# Patient Record
Sex: Male | Born: 1950 | Race: Black or African American | Hispanic: No | Marital: Married | Smoking: Former smoker
Health system: Southern US, Community
[De-identification: ages and names within clinical notes are randomized; demographics above are authoritative.]

## PROBLEM LIST (undated history)

## (undated) DIAGNOSIS — A0472 Enterocolitis due to Clostridium difficile, not specified as recurrent: Secondary | ICD-10-CM

## (undated) DIAGNOSIS — A4181 Sepsis due to Enterococcus: Secondary | ICD-10-CM

## (undated) DIAGNOSIS — I2510 Atherosclerotic heart disease of native coronary artery without angina pectoris: Secondary | ICD-10-CM

## (undated) DIAGNOSIS — I951 Orthostatic hypotension: Secondary | ICD-10-CM

## (undated) DIAGNOSIS — I5022 Chronic systolic (congestive) heart failure: Secondary | ICD-10-CM

## (undated) DIAGNOSIS — Z953 Presence of xenogenic heart valve: Secondary | ICD-10-CM

## (undated) DIAGNOSIS — N186 End stage renal disease: Secondary | ICD-10-CM

## (undated) DIAGNOSIS — K648 Other hemorrhoids: Secondary | ICD-10-CM

## (undated) DIAGNOSIS — E119 Type 2 diabetes mellitus without complications: Secondary | ICD-10-CM

## (undated) DIAGNOSIS — Z992 Dependence on renal dialysis: Secondary | ICD-10-CM

## (undated) DIAGNOSIS — Z8774 Personal history of (corrected) congenital malformations of heart and circulatory system: Secondary | ICD-10-CM

## (undated) DIAGNOSIS — I10 Essential (primary) hypertension: Secondary | ICD-10-CM

## (undated) DIAGNOSIS — K922 Gastrointestinal hemorrhage, unspecified: Principal | ICD-10-CM

## (undated) DIAGNOSIS — K5790 Diverticulosis of intestine, part unspecified, without perforation or abscess without bleeding: Secondary | ICD-10-CM

## (undated) DIAGNOSIS — D638 Anemia in other chronic diseases classified elsewhere: Secondary | ICD-10-CM

## (undated) DIAGNOSIS — E785 Hyperlipidemia, unspecified: Secondary | ICD-10-CM

## (undated) DIAGNOSIS — J90 Pleural effusion, not elsewhere classified: Secondary | ICD-10-CM

## (undated) DIAGNOSIS — I480 Paroxysmal atrial fibrillation: Secondary | ICD-10-CM

## (undated) DIAGNOSIS — K56609 Unspecified intestinal obstruction, unspecified as to partial versus complete obstruction: Secondary | ICD-10-CM

## (undated) DIAGNOSIS — I4510 Unspecified right bundle-branch block: Secondary | ICD-10-CM

## (undated) DIAGNOSIS — K6389 Other specified diseases of intestine: Secondary | ICD-10-CM

## (undated) HISTORY — DX: Sepsis due to Enterococcus: A41.81

## (undated) HISTORY — DX: Atherosclerotic heart disease of native coronary artery without angina pectoris: I25.10

## (undated) HISTORY — DX: Gastrointestinal hemorrhage, unspecified: K92.2

## (undated) HISTORY — DX: Type 2 diabetes mellitus without complications: E11.9

## (undated) HISTORY — DX: Essential (primary) hypertension: I10

## (undated) HISTORY — DX: Dependence on renal dialysis: Z99.2

## (undated) HISTORY — DX: Hyperlipidemia, unspecified: E78.5

## (undated) HISTORY — DX: Anemia in other chronic diseases classified elsewhere: D63.8

## (undated) HISTORY — DX: End stage renal disease: N18.6

## (undated) HISTORY — DX: Personal history of (corrected) congenital malformations of heart and circulatory system: Z87.74

---

## 2003-05-05 ENCOUNTER — Ambulatory Visit (HOSPITAL_COMMUNITY): Admission: RE | Admit: 2003-05-05 | Discharge: 2003-05-05 | Payer: Self-pay | Admitting: Internal Medicine

## 2003-05-05 ENCOUNTER — Encounter: Payer: Self-pay | Admitting: Internal Medicine

## 2003-09-22 ENCOUNTER — Ambulatory Visit (HOSPITAL_COMMUNITY): Admission: RE | Admit: 2003-09-22 | Discharge: 2003-09-22 | Payer: Self-pay | Admitting: Cardiovascular Disease

## 2004-08-25 ENCOUNTER — Ambulatory Visit (HOSPITAL_COMMUNITY): Admission: RE | Admit: 2004-08-25 | Discharge: 2004-08-25 | Payer: Self-pay | Admitting: Cardiovascular Disease

## 2004-08-31 ENCOUNTER — Ambulatory Visit (HOSPITAL_COMMUNITY): Admission: RE | Admit: 2004-08-31 | Discharge: 2004-08-31 | Payer: Self-pay | Admitting: *Deleted

## 2005-03-11 ENCOUNTER — Emergency Department (HOSPITAL_COMMUNITY): Admission: EM | Admit: 2005-03-11 | Discharge: 2005-03-11 | Payer: Self-pay | Admitting: Emergency Medicine

## 2005-10-03 ENCOUNTER — Ambulatory Visit (HOSPITAL_COMMUNITY): Admission: RE | Admit: 2005-10-03 | Discharge: 2005-10-03 | Payer: Self-pay | Admitting: Family Medicine

## 2006-12-25 ENCOUNTER — Ambulatory Visit: Payer: Self-pay | Admitting: Internal Medicine

## 2006-12-25 ENCOUNTER — Inpatient Hospital Stay (HOSPITAL_COMMUNITY): Admission: EM | Admit: 2006-12-25 | Discharge: 2007-01-09 | Payer: Self-pay | Admitting: Emergency Medicine

## 2006-12-27 ENCOUNTER — Encounter: Payer: Self-pay | Admitting: Vascular Surgery

## 2006-12-27 ENCOUNTER — Ambulatory Visit: Payer: Self-pay | Admitting: Cardiothoracic Surgery

## 2006-12-31 ENCOUNTER — Encounter (INDEPENDENT_AMBULATORY_CARE_PROVIDER_SITE_OTHER): Payer: Self-pay | Admitting: Cardiology

## 2007-01-02 ENCOUNTER — Encounter (INDEPENDENT_AMBULATORY_CARE_PROVIDER_SITE_OTHER): Payer: Self-pay | Admitting: Specialist

## 2007-01-05 HISTORY — PX: CORONARY ARTERY BYPASS GRAFT: SHX141

## 2007-01-05 HISTORY — PX: MITRAL VALVE REPLACEMENT: SHX147

## 2007-02-01 ENCOUNTER — Ambulatory Visit (HOSPITAL_COMMUNITY): Admission: RE | Admit: 2007-02-01 | Discharge: 2007-02-01 | Payer: Self-pay | Admitting: Cardiovascular Disease

## 2007-02-28 ENCOUNTER — Ambulatory Visit: Payer: Self-pay | Admitting: Cardiothoracic Surgery

## 2007-04-19 ENCOUNTER — Ambulatory Visit (HOSPITAL_COMMUNITY): Admission: RE | Admit: 2007-04-19 | Discharge: 2007-04-19 | Payer: Self-pay | Admitting: Cardiovascular Disease

## 2007-04-19 HISTORY — PX: CARDIOVERSION: SHX1299

## 2007-05-20 ENCOUNTER — Ambulatory Visit: Payer: Self-pay | Admitting: Cardiothoracic Surgery

## 2008-08-24 ENCOUNTER — Ambulatory Visit (HOSPITAL_COMMUNITY): Admission: RE | Admit: 2008-08-24 | Discharge: 2008-08-24 | Payer: Self-pay | Admitting: Family Medicine

## 2010-09-18 ENCOUNTER — Encounter: Payer: Self-pay | Admitting: Cardiothoracic Surgery

## 2011-01-10 NOTE — Cardiovascular Report (Signed)
NAME:  [name] [name], [name] [name]                  ACCOUNT NO.:  [id number]   MEDICAL RECORD NO.:  [id number]          PATIENT TYPE:  OIB   LOCATION:  NA                           FACILITY:  [facility]   PHYSICIAN:  Richard A. Alanda Amass, M.D.DATE OF BIRTH:  28-Dec-1950   DATE OF PROCEDURE:  04/19/2007  DATE OF DISCHARGE:                            CARDIAC CATHETERIZATION   HISTORY:  Mr. Gaus is a very pleasant 60 year old divorced father of 1  with 2 grandchildren who works full time.  He has a [place] history of  mitral regurgitation and ultimately required mitral valve replacement  with a pericardial mitral tissue valve and CABG times 4 by Dr. Sheliah Plane, on Jan 05, 2007.  Along with this he had suture closure of the  left atrial appendage and a right-sided maze because of atrial  fibrillation associated with his severe MR preoperatively.  He was  loaded on amiodarone but kept on this chronically along with chronic  Coumadin and plans for DC cardioversion.  His symptoms  have improved  significantly with resolution of his heart failure postoperatively.   Preoperative labs showed mild anemia with H&H of 10.6/32, normal  platelet count, therapeutic INR, TSH 2.5, mild elevation of glucose  otherwise normal CMP.  INRs have been therapeutic since before June  2008.  Informed consent was obtained by the patient to proceed with a DC  cardioversion and he understood the risks, alternatives and benefits of  the procedure.   DESCRIPTION OF PROCEDURE:  DC cardioversion was done under IV pentothal  anesthesia administered by Dr. Jacklynn Bue from anesthesia.  He was given a  total of 425 mg.  DC cardioversion was done with biphasic synchronized  shock with anterior and posterior patches.  A single shock of 150 joules  converted the patient to sinus rhythm. He awoke in the recovery room  without any sequelae.  Normal neurological exam and remained stable  hemodynamically.  He tolerated the procedure well.   We  plan to continue his current medications and follow him as outlined  in the chart and follow him up as an outpatient in 1 to 2 weeks or  p.r.n.   Successful outpatient DC cardioversion as outlined.      Richard A. Alanda Amass, M.D.  Electronically Signed     RAW/MEDQ  D:  04/19/2007  T:  04/20/2007  Job:  [id number]   cc:   Conception Chancy, MD  CP Lab  Sheliah Plane, MD

## 2011-01-10 NOTE — Assessment & Plan Note (Signed)
OFFICE VISIT   Kirley, Saifan T  DOB:  1951/06/02                                        February 28, 2007  CHART #:  [id number]   Mr. Laskowski underwent an extensive operative procedure on May 10th with  coronary artery bypass grafting x4, mitral valve replacement with  pericardial tissue valve, suture closure of left atrial appendage, left  and right MAZE procedure, placement of intraaortic balloon pump.  Since,  the patient has been progressing well, increasing his physical activity  appropriately, he has had no overt symptoms of congestive heart failure  or angina.  He was ultimately discharged home in atrial fibrillation  with a controlled rate on Coumadin with a plan to cardiovert him two to  three months postop if he remained in atrial fibrillation.  He continues  on Amiodarone therapy.   PHYSICAL EXAMINATION:  VITAL SIGNS:  The patient's blood pressure is  116/79, pulse is 90, respiratory rate is 18, O2 SAT is 97%.  CHEST:  The patient's sternum is stable and well healed.  HEART:  There is no murmur of mitral insufficiency.  LUNGS:  Clear bilaterally.  EXTREMITIES:  He has no pedal edema.   Followup chest x-ray shows clear lung fields bilaterally.   Rhythm strip shows continued atrial fibrillation with a controlled  ventricular rate.  The patient notes that he has continued to take his  Coumadin, which has been actively managed by Dr. Kandis Cocking office.  He  is tentatively scheduled in the next month to have an elective  cardioversion by Dr. Alanda Amass.  I have discussed with the patient  returning to work  approximately the first of August as his work does involve some degree  of lifting.  Otherwise, I will plan to see him back in three months.   Sheliah Plane, MD  Electronically Signed   EG/MEDQ  D:  02/28/2007  T:  02/28/2007  Job:  [id number]   cc:   Gerlene Burdock A. Alanda Amass, M.D.  Madelin Rear. Sherwood Gambler, MD

## 2011-01-13 NOTE — Consult Note (Signed)
NAMERENJI, BERWICK NO.:  [id number]   MEDICAL RECORD NO.:  [id number]           PATIENT TYPE:   LOCATION:  3735                         FACILITY:  [facility]   PHYSICIAN:  Sheliah Plane, MD    DATE OF BIRTH:  Jul 01, 1951   DATE OF CONSULTATION:  12/27/2006  DATE OF DISCHARGE:                                 CONSULTATION   STAT CARDIAC SURGERY CONSULTATION   PRIMARY CARE PHYSICIAN:  Madelin Rear. Fusco, MD.   REASON FOR CONSULTATION:  Severe mitral regurgitation, atrial  fibrillation, congestive heart failure.   HISTORY OF PRESENT ILLNESS:  The patient is a 60 year old male, who now  presents with three to four weeks of increasing weakness, shortness of  breath, fatigue, and palpitations.  He was found to be in atrial  fibrillation approximately three weeks ago and was started on Coumadin.  He comes in now with increasing symptoms and in rapid atrial  fibrillation.  He has a known previous history of mitral valve disease,  initially seen in January of 2005, and at that time an echocardiogram  showed marked left atrial enlargement with a small patent foramen and  ejection fraction of 50% with severe mitral regurgitation.  The patient  now returns with symptoms noted above.  A repeat echocardiogram shows  ejection fraction is now in the 25 to 30% range with enlargement of the  left atrium and the left ventricle, moderately severe MR, and global  hypokinesis.  Patient has had no previous history of myocardial  infarction, denies hemoptysis.  Cardiac risk factors include  hypertension and hyperlipidemia, and a positive family history.  His  father died at 78 with diabetes and his mother died at 48 with diabetes.  The patient has known diabetes, type 2.  A recent hemoglobin A1c is 7.5.  Patient is a smoker, one to two packs a day for over 40 years.  No  previous history of stroke.  Denies claudication.  No known renal  insufficiency.  Other past medical history  includes a right eye injury.   SOCIAL HISTORY:  The patient is divorced, currently works as a Artist, occasionally drinks alcohol.   MEDICATIONS:  1. Newly started on Digoxin.  2. Lasix 20 mg a day.  3. Amaryl 4 mg a day.  4. Glucophage 500 mg b.i.d.  5. KCL 10 mEq a day.  6. Toprol-XL 100 mg a day.  7. Allopurinol 100 mg a day.  8. Lipitor 10 mg a day.  9. One baby aspirin a day.  10.Benicar 20 mg a day.   DRUG ALLERGIES:  QUESTION OF NORVASC.   CARDIAC REVIEW OF SYSTEMS:  Complains of chest fullness, exertional  shortness of breath, increasing resting shortness of breath, positive  for fatigue and weakness, denies syncope or presyncope.  Notes  palpitations, and some 1 to 2+ mild lower extremity edema.   GENERAL REVIEW OF SYSTEMS:  Denies fever, chills, or night sweats.  RESPIRATORY:  He does have history of chronic bronchitis.  No change in  bowel habits, no blood in his stool.  He has  a remote history of gout,  occasional nocturia, a history of anemia, diabetes for at least 10 to 15  years.  Denies a psychiatric history.  The patient does get regular  dental care and most recently saw the dentist six months ago.   PHYSICAL EXAMINATION:  VITAL SIGNS:  Blood pressure 98/68, pulse 82, and  respiratory rate 20.  O2 SAT is 97% on room air.  The patient is 6 feet  4 inches tall, 100.4 kg.  GENERAL:  The patient is awake, alert, and neurologically intact and  able to relate his history.  NECK:  He has no carotid bruits.  LUNGS:  Clear bilaterally.  CARDIAC:  A 3+ systolic murmur radiating to the apex and to the left  axilla consistent with mitral stenosis.  ABDOMEN:  Benign without palpable masses.  EXTREMITIES:  +1 DP and PT pulses bilaterally.   LABORATORY FINDINGS:  White count is 11.4, hematocrit is 35.6, total  cholesterol 124, TSH 1.6, BNP 267.  INR is elevated to 7.5 and then now  is decreased to 4.5.   IMPRESSION:  A 60 year old male with a questionable  new onset of atrial  fibrillation, increasing congestive heart failure symptoms, decreasing  left ventricular function, and severe mitral regurgitation.  Agree with  correcting the patient's coagulation studies and proceeding with cardiac  catheterization with a mitral valve repair or replacement in mind and  probable Maze at the same time.  The actual valve repair or replacement  was discussed with the patient in detail, and we will await for the  catheterization result findings to review further with him and make a  definitive recommendation about when to proceed with surgery, but with  his new onset of symptoms, proceeding sooner would be preferable.      Sheliah Plane, MD  Electronically Signed     EG/MEDQ  D:  12/27/2006  T:  12/27/2006  Job:  [id number]   cc:   Gerlene Burdock A. Alanda Amass, M.D.

## 2011-01-13 NOTE — Op Note (Signed)
[name], [name] NO.:  [id number]   MEDICAL RECORD NO.:  [id number]          PATIENT TYPE:  INP   LOCATION:  2311                         FACILITY:  [facility]   PHYSICIAN:  Sheliah Plane, MD    DATE OF BIRTH:  1950/11/09   DATE OF PROCEDURE:  01/05/2007  DATE OF DISCHARGE:                               OPERATIVE REPORT   PREOPERATIVE DIAGNOSES:  1. Coronary occlusive disease.  2. Severe mitral regurgitation.  3. Chronic atrial fibrillation.  4. Patent foramen ovale.   POSTOPERATIVE DIAGNOSES:  1. Coronary occlusive disease.  2. Severe mitral regurgitation.  3. Chronic atrial fibrillation.  4. Patent foramen ovale.   OPERATION/PROCEDURE:  1. Coronary artery bypass grafting x4 with the left internal mammary      artery to the left anterior descending coronary artery, reverse      saphenous vein graft to the first obtuse marginal coronary artery,      sequential reverse saphenous vein graft to the posterior descending      and posterolateral branches of the right coronary artery.  2. Attempted repair of mitral valve.  3. Mitral valve replacement with a pericardial tissue valve, 31 mm      [employer] model 6900P, serial number [id number].  4. Suture closure of left atrial appendage.  5. Left atrial and right atrial MAZE procedure.  6. Placement of right femoral intra-aortic balloon pump.  7. Right endovein harvesting.   SURGEON:  Sheliah Plane, M.D.   BRIEF HISTORY:  The patient is a 60 year old male who approximately five  years prior had presented with severe mitral regurgitation.  Ultimately  the patient was lost to followup and returns with significantly  depressed left ventricular function, congestive heart failure, severe  mitral regurgitation.  Cardiac catheterization reveals significant 3-  vessel coronary artery disease and the patient presents in rapid atrial  fibrillation.  Mitral valve repair or replacement and coronary artery  bypass grafting were recommended to the patient, in spite of his  depressed left ventricular function.  He was agreeable.  The patient has  a history of diabetes.  The coronary vessels, especially the right  system, were of extremely poor distal quality.   DESCRIPTION OF PROCEDURE:  With Swan-Ganz catheter and arterial line  monitors in place, the patient was brought to the operating room. Before  induction of anesthesia, he developed significant atrial fibrillation  with accelerated ventricular response into the 160s.  With induction of  anesthesia, he was started on amiodarone to decrease his atrial  fibrillation.  The skin of the chest and legs was prepped with Betadine  and draped in the usual sterile manner.  Using the Guidant vein  harvesting system, vein was harvested from the right thigh.  It was of  good quality and caliber.  A median sternotomy was performed.  Left  internal mammary artery was dissected out as pedicle graft. The distal  artery was divided and had good __________ .  Pericardium was opened.  Overall ventricular function appeared depressed.  This was confirmed  with TEE  The patient significant annular  dilatation and was somewhat  posteriorly directed involving the P2 and P3.  The patient was  systemically heparinized.  The ascending aorta was cannulated.  Superior  and inferior vena cava cannulas were cannulated.  Retrograde  cardioplegia catheter was placed.   The patient was placed on cardiopulmonary bypass, 2.4 L/per minute/sq m.  The patient's body temperature cooled to 30 degrees.  Aortic crossclamp  was applied and 500 mL of cold blood potassium cardioplegia was  administered with diastolic arrest of the heart.  Attention was turned  first to do a portion of the MAZE procedure. Left atrial appendage  lesions were created at the base of the left atrial appendage and the  left pulmonary veins with the bipolar device.  Then coronary grafting  was carried  out.   First the obtuse marginal coronary artery was opened and was 1.2 to 1.3  mm in size.  Using a running 7-0 Prolene, anastomosis was performed with  segment of reverse saphenous vein graft.   Attention was then turned to the posterior descending vessel which was  very diffusely diseased and proximal third of the vessel was an area  that was suitable for to open.  A 1 mm probe passed distally.  Using a  running 7-0 Prolene, a side-to-side anastomosis was carried out.  Distal  extent of the same vein was carried to the posterolateral branch which  was less diseased, but a smaller branch. Using a running 7-0 Prolene,  distal anastomosis was performed with the extension of the same vein.   Attention was then turned to left anterior descending coronary artery  which was opened at the mid to distal third. This vessel was slightly  thickened, larger than the others, approximately 1.5 mm in size.  Using  a running 8-0 Prolene, the left internal mammary artery was anastomosed  to the left anterior descending coronary artery.   With completion of the grafts done, the left atrium was opened along  with left atrial appendage.  With adequate traction, pulmonary veins  were identified.  The completion of the left-sided maze procedure was  done partially with the bipolar device.  Crossing  lesions were created  with the bipolar device.  The lesions to the base of the atrial  appendage and mitral valve were completed with monopolar device.  The  valve was carefully inspected.  In the area of P1 and part of P2 was  calcification of the valve.  There were no flail chordae appreciated.  Initial thought that annular plasty ring would provide adequate  coaptation.  A 28 Physio ring was selected and secured in place with #2  Ticron pledgeted sutures.  However, after completion of placement of the  ring, it appeared that this was not satisfactory.  The ring was placed and in placing, it was decided to  replace the valve because of the  degree of distal coronary disease.  A tissue valve was selected as it  was felt that overall the patient's coronary disease would be a limiting  factor than mechanical versus tissue valve.  The bar and calcium was  debrided from the mitral annulus.  Portions of the posterior leaflet  that were not calcified were left in place.  The anterior leaflet was  divided. Two pedicles of anterior leaflet were preserved and tacked to  the annulus.  A #31 Perimount mitral valve was selected and secured in  place with pledgeted sutures with the pledgets on the atrial surface.  The valve seated  well and was secured in place.  A vent was placed into  the left atrium.  The atriotomy was closed with horizontal mattress 4-0  Prolene sutures.   Attention was then turned to the ascending aorta where after given  additional cold blood cardioplegia, both antegrade and retrograde, with  the cross vent still in place, two punch aortotomies were performed and  each of two vein grafts were anastomosed to the ascending aorta.  The  final aortotomy was left to de-air.  As the heart was rewarmed, the  right atrium was opened and a right-sided MAZE procedure was completed  with using the bipolar device superiorly and inferiorly in the atrium.  The monopolar device was used to cross the foramen ovale  to the  coronary sinus to the tricuspid valve and to the isthmus.  A very small  patent foramen was closed with a single 4-0 Prolene suture.   Aortic crossclamp was removed with total crossclamp time of 254 minutes.  Overall the patient, even preoperatively, had depressed left ventricular  function.  He was started on milrinone and Levophed and with these, he  was able to be separated from cardiopulmonary bypass.  However, because  of low blood pressure and concern of overall left ventricular function,  a previous guidewire was placed through a previously placed right  femoral arterial  line and a intra-aortic balloon pump was positioned by  the right femoral artery.  With this, the patient's pressure was better  stabilized.  He was in sinus rhythm.  Atrial and ventricular pacing  wires were applied.  He was then separated from cardiopulmonary bypass,  decannulated in the usual fashion.  Protamine sulfate was administered.  Platelets and fresh frozen plasma were also administered.  A left  pleural tube and a Blake drain were left in place.  Pericardium was  loosely reapproximated.  Sternum closed with #6 stainless steel wire,  fascia closed with interrupted 0 Vicryl, running 3-0 Vicryl and  subcutaneous tissue with 4-0 subcuticular stitch in skin edges.  Dry  dressings were applied.  Sponge and needle counts were reported as  correct at the completion of the procedure.  The patient was transferred  to the surgical intensive care unit for further postoperative care  milrinone and Levophed infusions going with the support of intra-aortic balloon pump, but overall with adequate cardiac output and urinary  output.      Sheliah Plane, MD  Electronically Signed     EG/MEDQ  D:  01/05/2007  T:  01/06/2007  Job:  [id number]   cc:   Gerlene Burdock A. Alanda Amass, M.D.

## 2011-01-13 NOTE — Cardiovascular Report (Signed)
[name], [name] NO.:  [id number]   MEDICAL RECORD NO.:  [id number]          PATIENT TYPE:  AMB   LOCATION:  ENDO                         FACILITY:  [facility]   PHYSICIAN:  [name] Priestly, MD  DATE OF BIRTH:  [birth year]   DATE OF PROCEDURE:  09/01/2003  DATE OF DISCHARGE:                              CARDIAC CATHETERIZATION   PROCEDURE:  Transesophageal echocardiogram.   ATTENDING PHYSICIAN:  [name] Priestly, MD   COMPLICATIONS:  None.   INDICATIONS FOR PROCEDURE:  [name] [name] is a 60 year old male patient of Dr.  Franchot Heidelberg with a history of adult onset diabetes mellitus,  hypertension, history of moderately severe mitral regurgitation with left  atrial enlargement and left ventricular  enlargement.  He recently underwent  2D echocardiogram revealing low normal EF with mild, severe MR with possible  flailed posterior mitral valve leaflet.  The left atrium was markedly  enlarged to 6.7 cm.  He is now referred for transesophageal echocardiogram  to assess his mitral valve.   DESCRIPTION OF PROCEDURE:  After informed consent, the patient was brought  to endoscopy in a fasting state.  The patient underwent successful  uncomplicated transesophageal echocardiogram.  There was moderate dilation  and thickening of the left ventricle (6.8 cm) with low normal systolic  function with EF of approximately 50%.  There was a moderately thickened  aortic valve with no significant aortic stenosis or regurgitation.  There  was moderate thickening of the anterior and posterior mitral valve leaflets  with no significant limitation of mobility of the anterior leaflet, through  there was some mild restriction of mobility in the posterior leaflet.  There  was mild to moderate prolapse of both the anterior and posterior mitral  valve leaflets but no evidence of flailed posterior leaflet.  There was no  evidence of significant mitral stenosis.  There was moderately severe  mitral  regurgitation noted which was directed posteriorly and wrapped around the  apical portion of the left atrium.  There was mild pulmonary vein flow  reversal.  There was marked left atrial enlargement at 7.5 cm.  A  structurally normal tricuspid valve with trivial tricuspid regurgitation.  Small PFO noted with left to right but no right to left shunting by color  and contrast echo.  No evidence of intracardiac mass or thrombus noted.  Limited gastric views were obtained.   CONCLUSION:  1.  Successful transesophageal echocardiogram with evidence of prolapse of      the anterior and posterior mitral valve leaflets but no flailed      posterior leaflet.  There is moderately severe mitral regurgitation with      a posteriorly directed jet wrapping around the apical portion of the      left atrium.  2.  Markedly left atrial enlargement.  3.  Moderately dilated and thickened left ventricle with low normal systolic      function.  4.  Small patent foramen ovale noted with left to right but no right to left      shunting.      RHM/MEDQ  D:  08/31/2004  T:  08/31/2004  Job:  [id number]   cc:   Gerlene Burdock A. Alanda Amass, M.D.  [phone] [address]  [place]  [place] [address]  Fax: [phone]

## 2011-01-13 NOTE — Consult Note (Signed)
[name], [name] NO.:  [id number]   MEDICAL RECORD NO.:  [id number]          PATIENT TYPE:  INP   LOCATION:  3735                         FACILITY:  [facility]   PHYSICIAN:  [name] Canning. Ladona Ridgel, MD    DATE OF BIRTH:  10/15/1950   DATE OF CONSULTATION:  12/26/2006  DATE OF DISCHARGE:                                 CONSULTATION   REFERRING PHYSICIAN:  Dr. Susa Griffins.   INDICATION FOR CONSULTATION:  Evaluation of atrial fibrillation and  nonsustained ventricular tachycardia.   HISTORY OF PRESENT ILLNESS:  The patient is a 60 year old male with a  history of mitral valve prolapse who was in his usual state of health  until presenting until approximately 1 month prior to presentation.  He  had been seen back in 2005 and was essentially stable with no mitral  valve prolapse.  About a month prior to admission, he noted increasing  dyspnea, shortness of breath, fatigue, and weakness.  Two days prior to  admission he developed peripheral edema.  He presented emergency room  where he was found to be in AFib with a rapid ventricular response.  He  denies any history of syncope.  The patient was treated with AV nodal  blocking drugs and diuresis.  He was noted to have an INR over 7.  He is  on telemetry and had nonsustained VT.  He has no history of syncope.  He  denies chest pain.  His dyspnea is much improved since admission.  He is  now referred for additional evaluation.  Otherwise, his health has been  fairly stable though he has had some periods where he did not go seek  medical attention.  He denies fevers or chills or other infectious  symptoms.   MEDICATIONS:  Include Lasix, potassium, digoxin, Glucophage, Zocor,  Avapro, Toprol-XL.   SOCIAL HISTORY:  The patient lives in [place].  He is a previous Artist.  He has a 1 to 2 pack per week smoking history.  He occasionally  drinks beer and other alcoholic beverages.   FAMILY HISTORY:  Notable for his  mother who died of diabetes  complications at age 39, and a father with diabetes complications at age  62.   REVIEW OF SYSTEMS:  As noted in the HPI.  Also, he has some occasional  nocturia and generalized weakness.  No real palpitations.  Otherwise  review of systems is as noted in the HPI.   PHYSICAL EXAMINATION:  GENERAL:  He is a pleasant middle-aged man who is  fairly well appearing in no acute distress.  VITAL SIGNS:  The blood pressure was 106/71, the pulse was 90 and  irregularly irregular, respirations were 17, and temperature is 97.7.  HEENT:  Normocephalic, atraumatic.  Pupils equal and round.  Oropharynx:  Moist.  Sclerae anicteric.  NECK:  There was 8 cm of jugular venous distention.  LUNGS:  Clear, except for rales in the bases bilaterally.  There are no  wheezes or rhonchi.  CARDIAC EXAM:  Irregular regular rhythm with normal S1, S2.  There is  a  grade 3/6 systolic murmur at left lower sternal border consistent with  mitral regurgitation.  The PMI was enlarged and laterally displaced.  There was a prominent left ventricular heave.  ABDOMINAL EXAM:  Soft, nontender and nondistended.  There was no  organomegaly.  Bowel sounds were present.  There was no rebound or  guarding.  EXTREMITIES:  Demonstrated no cyanosis, clubbing or edema.  The pulses  were 2+ and symmetric.   The EKG demonstrates atrial fibrillation with rapid ventricular  response.  A 2-D echo demonstrates an EF of 25% to 30% with enlargement  of the left atrium and the left ventricle.  There was moderate to severe  mitral regurg.   IMPRESSION:  1. New onset atrial fibrillation with a rapid ventricular response and      minimal if any symptoms.  ?Tachycardia-induced cardiomyopathy.  2. Congestive heart failure with dilated left atrium and left      ventricle by 2-D echocardiogram, ejection fraction 25%.  3. Nonsustained ventricular tachycardia (asymptomatic).  4. Diabetes.  5. Hypertension.  6.  Coagulopathy with international normalized ratio of 7.   DISCUSSION:  It is unclear which came first, worsening congestive heart  failure secondary to worsening of mitral regurg, or worsening congestive  heart failure secondary to atrial fibrillation and a subsequent  tachycardia-induced cardiomyopathy.  This of course would make mitral  regurg and heart failure even worse.  Either way, he needs to return to  sinus rhythm, a left and right heart cath to define his coronary anatomy  and following this, surgical consultation for consideration for valve  surgery.  Despite his increased surgical risk, mitral valve repair and  maze surgery plus/minus amiodarone would be advisable for maintenance of  sinus rhythm and to improve his hemodynamics.  If he is initially  thought not to be a surgical candidate secondary to his severe LV  dysfunction, then consideration with TEE-guided DC cardioversion,  initiation of amiodarone, and repeat of his 2-D echo several months from  now would be a consideration.  For now, we recommend continued diuresis  with IV diuretics and rate control with a beta blocker and digoxin  plus/minus diltiazem.      [name] Canning. Ladona Ridgel, MD  Electronically Signed     GWT/MEDQ  D:  12/26/2006  T:  12/27/2006  Job:  [id number]   cc:   Madelin Rear. Sherwood Gambler, MD  Nicki Guadalajara, M.D.

## 2011-01-13 NOTE — Cardiovascular Report (Signed)
[name] [name], SALMINEN NO.:  [id number]   MEDICAL RECORD NO.:  [id number]          PATIENT TYPE:  INP   LOCATION:  3735                         FACILITY:  [facility]   PHYSICIAN:  Cristy Hilts. Jacinto Halim, MD       DATE OF BIRTH:  10/23/1950   DATE OF PROCEDURE:  12/31/2006  DATE OF DISCHARGE:                            CARDIAC CATHETERIZATION   PROCEDURE PERFORMED:  1. Right and left heart catheterization including calculation of      cardiac output and cardiac index by Fick.  He also underwent left      ventriculography.  2. Selective right and left coronary arteriography.  3. Abdominal aortogram.  4. Left subclavian arteriography with visualization of LIMA.  5. Selective atrial arteriography.   INDICATIONS:  [name] is a 60 year old gentleman with diabetes,  hypertension, hyperlipidemia who was admitted to the hospital with new  onset of atrial fibrillation.  He was found to have severe mitral  regurgitation by echocardiogram with markedly depressed left ventricular  systolic function.  Given this, he is now referred for right and left  heart catheterization with an eye towards mitral valve replacement  and/or repair.  Abdominal aortogram was performed to evaluate for  abdominal atherosclerosis and renal artery stenosis given his multiple  cardiovascular risk factors and his severe coronary artery disease.   HEMODYNAMIC DATA:  RIGHT HEART CATHETERIZATION:  RA pressures were 9/10  with a mean of 9 mmHg.  RV pressures were 30/4 with an end-diastolic  pressure of 6 mmHg.  PA pressures were 33/90 with a mean of 25 mmHg.  PA  saturation was 61%.   Pulmonary capillary wedge pressure was 16/15 with a mean of 15 mmHg.  Aortic saturation was 93% on room air.   Cardiac output was 5.3 with a cardiac index of 2.4 by Fick.   HEMODYNAMIC DATA:  LEFT HEART CATHETERIZATION:  Left ventricular  pressure 113/3 with end-diastolic pressure of 9 mmHg.  The aortic  pressure of 109/82  with a mean of 96 mmHg.  There was no pressure  gradient across the aortic valve.   ANGIOGRAPHIC DATA:  LEFT VENTRICLE:  The left ventricle was mildly  dilated.  The left ventricular systolic function was markedly depressed  with global hypokinesis with an ejection fraction of 30%.  There was 4+  mitral regurgitation.  The left atrium was markedly dilated.   RIGHT CORONARY ARTERY:  The right coronary artery is a large caliber  vessel and a dominant vessel.  He has got moderate diffuse luminal  irregularity.  It gives origin to a moderate-sized PDA which has a mid  90% stenosis and a moderate caliber, however large PLA branch which has  a mid 90% stenosis.   LEFT MAIN:  The left main is widely patent with mild luminal  irregularity.   CIRCUMFLEX:  The circumflex is a large-caliber vessel.  It has got  moderate diffuse luminal irregularity.  It has got mild ectasia in the  mid segment.  The distal circumflex has a 90% stenosis.   LAD:  The LAD is a  large caliber vessel.  It has got mild diffuse  luminal irregularity.  There is mild ectasia in the mid segment.  There  was an eccentric 80-90% stenosis of the mid LAD  The LAD gives origin to  a large diagonal I which has a mid-to-distal 50% stenosis and a small to  medium sized diagonal II between the high-grade stenosis.  The distal  LAD has a 60% stenosis at the apex.   LEFT SUBCLAVIAN ARTERY AND LIMA:  The left subclavian artery and LIMA  are widely patent.   ABDOMINAL AORTOGRAM:  The abdominal aortogram revealed the presence of  two renal arteries, one on either side.  There was no evidence of  abdominal aortic aneurysm.  There was appearance of a high-grade  stenosis of the right renal artery.   SELECTIVE RIGHT RENAL ARTERIOGRAPHY: Selective right renal arteriography  revealed widely patent right renal artery.   IMPRESSION:  1. Severe diffuse three-vessel coronary artery disease.  2. Severe 4+ mitral regurgitation.  The left  atrium is markedly      dilated.  The left ventricle is mildly dilated with severely      depressed left ventricular systolic function, EF around 30%.   RECOMMENDATIONS:  The patient will be considered for CABG, Maze plus  probable mitral valve repair versus replacement.  He will be considered  a high risk given his coronary anatomy and depressed LV systolic  function.  There is also moderate diffuse calcification of the coronary  artery.   A total of 160 mL of contrast was utilized for diagnostic angiography.   TECHNIQUE OF THE PROCEDURE:  Obtaining a 7- French right femoral venous  access and a 6-French right femoral arterial access, right and left  heart catheterization was performed in the usual fashion after the use  of local anesthetic.  This was done under sterile precautions.  A  balloon-tipped Swan-Ganz catheter was advanced into the right atrium,  right ventricle and pulmonary artery and pulmonary capillary wedge  pressure was obtained.  Right-sided hemodynamics were carefully analyzed  and the waveforms were carefully analyzed.  The PA saturations was also  obtained.  Then the catheter was pulled out of the body.   TECHNIQUE OF LEFT HEART CATHETERIZATION:  Using a 6-French multipurpose  B-II catheter which was advanced into the ascending aorta over a J-wire.  Selective right and left coronary arteriography was performed and the  catheter was utilized in the left subclavian artery and angiography was  performed.  The same catheter was  utilized to perform abdominal aortogram and selective right renal  arteriography and then the catheter was pulled out of the body.  The  patient tolerated the procedure.  Right femoral angiography was  performed through the arterial access sheath and access closed with Star  close with excellent hemostasis.      Cristy Hilts. Jacinto Halim, MD  Electronically Signed    JRG/MEDQ  D:  12/31/2006  T:  12/31/2006  Job:  [id number]   cc:   Madelin Rear.  Sherwood Gambler, MD  Nicki Guadalajara, M.D.  Richard A. Alanda Amass, M.D.

## 2011-01-13 NOTE — Discharge Summary (Signed)
[name] [name], [name] [name] NO.:  [id number]   MEDICAL RECORD NO.:  [id number]          PATIENT TYPE:  INP   LOCATION:  2029                         FACILITY:  [facility]   PHYSICIAN:  Sheliah Plane, MD    DATE OF BIRTH:  11/10/1950   DATE OF ADMISSION:  12/25/2006  DATE OF DISCHARGE:                               DISCHARGE SUMMARY   PRIMARY ADMITTING DIAGNOSES:  1. Rapid atrial fibrillation.  2. Significant mitral regurgitation.   ADDITIONAL/DISCHARGE DIAGNOSES:  1. Rapid atrial fibrillation.  2. Severe mitral regurgitation.  3. Congestive heart failure.  4. Coronary artery disease.  5. Patent foramen ovale.  6. History of tobacco abuse.  7. Hypertension.  8. Hyperlipidemia.  9. Type 2 diabetes mellitus.  10.History of gout.  11.Nonsustained ventricular tachycardia.  12.Postoperative acute blood loss anemia.   PROCEDURES PERFORMED:  1. Transesophageal echocardiogram.  2. Cardiac catheterization.  3. Coronary artery bypass grafting x4 (left internal mammary artery to      the LAD, saphenous vein graft to the first obtuse marginal,      sequential saphenous vein graft to posterior descending and      posterior lateral branch of the right coronary artery).  4. Mitral valve replacement with 31-mm Edwards pericardial tissue      valve.  5. Suture closure of left atrial appendage.  6. Left and right atrial maze procedure.  7. Placement of right femoral intra-aortic balloon pump.  8. Endoscopic vein harvest right leg.   HISTORY:  The patient is a 59 year old male with a known history of  mitral valve prolapse with significant mitral regurgitation which had  been followed by Dr. Alanda Amass and treated medically.  He recently  presented to the [facility] Cardiology office complaining of 3-week  history of weakness, fatigue, increased dyspnea, exercise intolerance,  and heart palpitations.  He had initially seen Dr. Sherwood Gambler and was found  to be in atrial  fibrillation.  He was started on Toprol-XL and Coumadin  by Dr. Tresa Endo and had a repeat echocardiogram which showed an ejection  fraction in the 25-30% range with enlargement of the left atrium and  left ventricle, moderately severe MR, and global hypokinesis.  Because  of his worsening symptoms of congestive heart failure and his new onset  atrial fibrillation, it was felt that he should be admitted for further  cardiac workup.  He was subsequently admitted to [facility]  under the cardiology service.   HOSPITAL COURSE:  [name] [name] was found to be coagulopathic on admission  with an INR greater than 7.  His coags were subsequently corrected.  He  was started on diuresis for his CHF and he was started on AV nodal  blocking drugs.  He was also found to have nonsustained ventricular  tachycardia for which showed an electrophysiology consult was obtained.  The patient was seen by Dr. Lewayne Bunting who agreed with a left and  right heart catheterization to further delineate his coronary anatomy,  and surgical consideration for possible valve surgery.  Once his  coagulopathy was corrected he was able to undergo  cardiac  catheterization and this showed severe mitral regurgitation with severe  three-vessel coronary artery disease which was not felt to be amenable  to percutaneous intervention.  A cardiothoracic surgery consultation was  obtained and the patient was seen by Dr. Sheliah Plane, who also  reviewed his films.  Dr. Tyrone Sage agreed that his best course of action  would be to proceed with surgical intervention at this time.  He  explained the risks, benefits and alternatives of procedure to the  patient and his family and he agreed to proceed with surgery.  He also  had a transesophageal echocardiogram which confirmed the findings of his  catheterization.  The patient remained stable during his workup and was  taken to the operating room on Jan 02, 2007, where he underwent CABG,   mitral valve replacement, and closure of PFO as described in detail  above, performed Dr. Tyrone Sage.  At the conclusion of the procedure an  intra-aortic balloon pump was placed.  He was transferred in stable  condition to the SICU.  He was extubated on postoperative day #1.  He  remained on multiple drips and an intra-aortic balloon pump but remained  stable.  By postoperative day #2 his drips were weaned and discontinued  and his balloon pump was also discontinued.  He remained in the unit for  close observation.  He was started on a Lasix drip for significant  volume overload.  He was also started on Coumadin.  He was initially  maintained on Glucommander insulin protocol and was switched to Lantus  and sliding scale insulin as per protocol.  He remained in atrial  fibrillation but his rate remained controlled and he was started on  amiodarone IV.  By postoperative day #4 he was making good progress and  was hemodynamically stable and off all drips and was ready for transfer  to the floor.  Overall, he has done very well postoperatively.  His  atrial fibrillation has remained rate controlled and his INR is trending  upward at this time.  Once his p.o. intake improved he was started on  his home diabetes medications and his Lantus discontinued.  His blood  sugars have subsequently remained stable throughout his hospitalization.  He has been afebrile and his other vital signs have been stable.  He has  been switched to an oral dose of Lasix and is diuresing well, and is  back within 2 kg of his preoperative weight.  He is ambulating the halls  without problem and is tolerating a regular diet and having normal bowel  and bladder function.  His labs on postoperative day #7 show a  hemoglobin of 8.7, hematocrit 26.7, white count 14.1, platelets 357,  sodium 133, potassium 4.7, BUN 20, creatinine 1.27, BNP 208, an INR of 1.8 and a PT of 21.5.  It is felt that since he is progressing well and   his INR is trending upward, no other acute problems have occurred in the  interim, that he may be discharged home at this time with close  outpatient monitoring of his anticoagulation.  It is also felt that he  might require cardioversion in 3 months if he continues to remain in  atrial fibrillation.  This will be followed up as an outpatient.   DISCHARGE MEDICATIONS:  1. Coumadin 5 mg alternated every-other day with 2.5 mg until blood      work is checked.  2. Aspirin 81 mg daily.  3. Lopressor 25 mg b.i.d.  4.  Avapro 75 mg daily.  5. Amiodarone 2 mg b.i.d.  6. Lasix 40 mg daily x 5 days.  7. K-Dur 20 mEq daily x 5 days.  8. Metformin 500 mg b.i.d.  9. Amaryl 4 mg daily.  10.Allopurinol 100 mg daily.  11.Tylox one to two q.4-6h. p.r.n. pain.   DISCHARGE INSTRUCTIONS:  He is asked to refrain from driving, heavy  lifting or strenuous activity.  He may continue ambulating daily and  using his incentive spirometer.  He may shower daily and clean his  incisions with soap and water.  He will continue a low-fat, low-sodium  diabetic modified diet.   DISCHARGE FOLLOWUP:  Home health nurse will be arranged to draw PT and  INR on Friday, Jan 11, 2007, with the results to be called to Dr.  Landry Dyke office in [place].  The patient is asked to make an  appointment see Dr. Tresa Endo in 2 weeks in the [facility] cardiology  office.  He will be contacted with an appoint see Dr. Tyrone Sage in 3  weeks and will also be given an appointment for a chest x-ray at  [facility] approximately 1 hour before his appointment with  the surgeon.  In the interim, if he experiences problems or has  questions he is asked to contact our office immediately.      Coral Ceo, P.A.      Sheliah Plane, MD  Electronically Signed    GC/MEDQ  D:  01/09/2007  T:  01/09/2007  Job:  [id number]   cc:   Nicki Guadalajara, M.D.  Doylene Canning. Ladona Ridgel, MD  Madelin Rear. Sherwood Gambler, MD

## 2011-02-11 ENCOUNTER — Inpatient Hospital Stay (HOSPITAL_COMMUNITY)
Admission: EM | Admit: 2011-02-11 | Discharge: 2011-02-18 | DRG: 377 | Disposition: A | Discharge status: Home / Self Care | Payer: Medicare Other | Source: Ambulatory Visit | Attending: Internal Medicine | Admitting: Internal Medicine

## 2011-02-11 DIAGNOSIS — T46905A Adverse effect of unspecified agents primarily affecting the cardiovascular system, initial encounter: Secondary | ICD-10-CM | POA: Diagnosis not present

## 2011-02-11 DIAGNOSIS — D131 Benign neoplasm of stomach: Secondary | ICD-10-CM

## 2011-02-11 DIAGNOSIS — D62 Acute posthemorrhagic anemia: Secondary | ICD-10-CM | POA: Diagnosis present

## 2011-02-11 DIAGNOSIS — B952 Enterococcus as the cause of diseases classified elsewhere: Secondary | ICD-10-CM | POA: Diagnosis not present

## 2011-02-11 DIAGNOSIS — A4181 Sepsis due to Enterococcus: Secondary | ICD-10-CM

## 2011-02-11 DIAGNOSIS — I120 Hypertensive chronic kidney disease with stage 5 chronic kidney disease or end stage renal disease: Secondary | ICD-10-CM | POA: Diagnosis present

## 2011-02-11 DIAGNOSIS — K921 Melena: Secondary | ICD-10-CM

## 2011-02-11 DIAGNOSIS — E119 Type 2 diabetes mellitus without complications: Secondary | ICD-10-CM | POA: Diagnosis present

## 2011-02-11 DIAGNOSIS — R791 Abnormal coagulation profile: Secondary | ICD-10-CM | POA: Diagnosis present

## 2011-02-11 DIAGNOSIS — N186 End stage renal disease: Secondary | ICD-10-CM | POA: Diagnosis present

## 2011-02-11 DIAGNOSIS — T45515A Adverse effect of anticoagulants, initial encounter: Secondary | ICD-10-CM | POA: Diagnosis present

## 2011-02-11 DIAGNOSIS — Z992 Dependence on renal dialysis: Secondary | ICD-10-CM

## 2011-02-11 DIAGNOSIS — R7881 Bacteremia: Secondary | ICD-10-CM | POA: Diagnosis not present

## 2011-02-11 DIAGNOSIS — I4891 Unspecified atrial fibrillation: Secondary | ICD-10-CM | POA: Diagnosis present

## 2011-02-11 DIAGNOSIS — I2589 Other forms of chronic ischemic heart disease: Secondary | ICD-10-CM | POA: Diagnosis present

## 2011-02-11 DIAGNOSIS — Z954 Presence of other heart-valve replacement: Secondary | ICD-10-CM

## 2011-02-11 DIAGNOSIS — I498 Other specified cardiac arrhythmias: Secondary | ICD-10-CM | POA: Diagnosis not present

## 2011-02-11 DIAGNOSIS — I2510 Atherosclerotic heart disease of native coronary artery without angina pectoris: Secondary | ICD-10-CM | POA: Diagnosis present

## 2011-02-11 HISTORY — DX: Sepsis due to Enterococcus: A41.81

## 2011-02-11 LAB — CARDIAC PANEL(CRET KIN+CKTOT+MB+TROPI)
CK, MB: 1.9 ng/mL (ref 0.3–4.0)
Troponin I: <0.3 ng/mL (ref <0.30)

## 2011-02-11 LAB — GLUCOSE, CAPILLARY: Glucose-Capillary: 159 mg/dL — ABNORMAL HIGH (ref 70–99)

## 2011-02-11 LAB — PROTIME-INR: Prothrombin Time: 53.7 seconds — ABNORMAL HIGH (ref 11.6–15.2)

## 2011-02-12 ENCOUNTER — Other Ambulatory Visit: Payer: Self-pay | Admitting: Gastroenterology

## 2011-02-12 DIAGNOSIS — K922 Gastrointestinal hemorrhage, unspecified: Secondary | ICD-10-CM

## 2011-02-12 DIAGNOSIS — K921 Melena: Secondary | ICD-10-CM

## 2011-02-12 HISTORY — DX: Gastrointestinal hemorrhage, unspecified: K92.2

## 2011-02-12 HISTORY — PX: ESOPHAGOGASTRODUODENOSCOPY: SHX1529

## 2011-02-12 LAB — GLUCOSE, CAPILLARY
Glucose-Capillary: 156 mg/dL — ABNORMAL HIGH (ref 70–99)
Glucose-Capillary: 142 mg/dL — ABNORMAL HIGH (ref 70–99)
Glucose-Capillary: 233 mg/dL — ABNORMAL HIGH (ref 70–99)
Glucose-Capillary: 108 mg/dL — ABNORMAL HIGH (ref 70–99)
Glucose-Capillary: 239 mg/dL — ABNORMAL HIGH (ref 70–99)

## 2011-02-12 LAB — PROTIME-INR
INR: 1.72 — ABNORMAL HIGH (ref 0.00–1.49)
Prothrombin Time: 20.5 seconds — ABNORMAL HIGH (ref 11.6–15.2)

## 2011-02-12 LAB — CBC
HCT: 21.3 % — ABNORMAL LOW (ref 39.0–52.0)
Platelets: 165 10*3/uL (ref 150–400)
RDW: 16.4 % — ABNORMAL HIGH (ref 11.5–15.5)
WBC: 7.8 10*3/uL (ref 4.0–10.5)

## 2011-02-12 LAB — DIFFERENTIAL
Basophils Absolute: 0 10*3/uL (ref 0.0–0.1)
Basophils Relative: 0 % (ref 0–1)
Eosinophils Relative: 1 % (ref 0–5)
Lymphocytes Relative: 11 % — ABNORMAL LOW (ref 12–46)
Neutro Abs: 6 10*3/uL (ref 1.7–7.7)

## 2011-02-12 LAB — BASIC METABOLIC PANEL
BUN: 50 mg/dL — ABNORMAL HIGH (ref 6–23)
Calcium: 8.5 mg/dL (ref 8.4–10.5)
Chloride: 98 mEq/L (ref 96–112)
Creatinine, Ser: 7.67 mg/dL — ABNORMAL HIGH (ref 0.50–1.35)
GFR calc Af Amer: 9 mL/min — ABNORMAL LOW (ref >60)
GFR calc non Af Amer: 7 mL/min — ABNORMAL LOW (ref >60)

## 2011-02-12 LAB — HEMOGLOBIN AND HEMATOCRIT, BLOOD
HCT: 27.9 % — ABNORMAL LOW (ref 39.0–52.0)
Hemoglobin: 9.3 g/dL — ABNORMAL LOW (ref 13.0–17.0)

## 2011-02-12 LAB — APTT: aPTT: 36 seconds (ref 24–37)

## 2011-02-12 LAB — CARDIAC PANEL(CRET KIN+CKTOT+MB+TROPI): Total CK: 49 U/L (ref 7–232)

## 2011-02-12 LAB — ABO/RH: ABO/RH(D): O POS

## 2011-02-13 DIAGNOSIS — D649 Anemia, unspecified: Secondary | ICD-10-CM

## 2011-02-13 DIAGNOSIS — D131 Benign neoplasm of stomach: Secondary | ICD-10-CM

## 2011-02-13 DIAGNOSIS — K921 Melena: Secondary | ICD-10-CM

## 2011-02-13 LAB — CROSSMATCH
Unit division: 0
Unit division: 0

## 2011-02-13 LAB — DIFFERENTIAL
Eosinophils Absolute: 0.2 10*3/uL (ref 0.0–0.7)
Lymphs Abs: 0.9 10*3/uL (ref 0.7–4.0)
Monocytes Relative: 8 % (ref 3–12)
Neutro Abs: 6.6 10*3/uL (ref 1.7–7.7)
Neutrophils Relative %: 79 % — ABNORMAL HIGH (ref 43–77)

## 2011-02-13 LAB — BASIC METABOLIC PANEL
CO2: 28 mEq/L (ref 19–32)
Calcium: 8.8 mg/dL (ref 8.4–10.5)
Chloride: 98 mEq/L (ref 96–112)
Creatinine, Ser: 8.85 mg/dL — ABNORMAL HIGH (ref 0.50–1.35)
Glucose, Bld: 118 mg/dL — ABNORMAL HIGH (ref 70–99)
Sodium: 137 mEq/L (ref 135–145)

## 2011-02-13 LAB — CBC
Hemoglobin: 8.7 g/dL — ABNORMAL LOW (ref 13.0–17.0)
MCH: 29.3 pg (ref 26.0–34.0)
MCV: 89.6 fL (ref 78.0–100.0)
Platelets: 149 10*3/uL — ABNORMAL LOW (ref 150–400)
RBC: 2.97 MIL/uL — ABNORMAL LOW (ref 4.22–5.81)
WBC: 8.3 10*3/uL (ref 4.0–10.5)

## 2011-02-13 LAB — GLUCOSE, CAPILLARY
Glucose-Capillary: 124 mg/dL — ABNORMAL HIGH (ref 70–99)
Glucose-Capillary: 194 mg/dL — ABNORMAL HIGH (ref 70–99)
Glucose-Capillary: 114 mg/dL — ABNORMAL HIGH (ref 70–99)

## 2011-02-13 LAB — HEMOGLOBIN AND HEMATOCRIT, BLOOD
HCT: 26.3 % — ABNORMAL LOW (ref 39.0–52.0)
Hemoglobin: 9.5 g/dL — ABNORMAL LOW (ref 13.0–17.0)

## 2011-02-13 LAB — PHOSPHORUS: Phosphorus: 4.2 mg/dL (ref 2.3–4.6)

## 2011-02-13 NOTE — H&P (Signed)
NAMERICHMOND, COLDREN NO.:  [id number]  MEDICAL RECORD NO.:  [id number]  LOCATION:  A320                          FACILITY:  [facility]  PHYSICIAN:  Vania Rea, M.D. DATE OF BIRTH:  12-Feb-1951  DATE OF ADMISSION:  02/11/2011 DATE OF DISCHARGE:  LH                             HISTORY & PHYSICAL   PRIMARY CARE PHYSICIAN:  Madelin Rear. Sherwood Gambler, MD, at [facility].  CARDIOLOGIST:  Pearletha Furl. Alanda Amass, MD, with [facility].  NEPHROLOGIST:  Jorja Loa, MD  CHIEF COMPLAINT:  Dizziness and weakness for about 3 days.  HISTORY OF PRESENT ILLNESS:  This is a 60 year old African American gentleman with multiple medical problems including diabetes, hypertension, coronary artery disease, and end-stage renal disease dialysis dependent who has been having dizziness especially with standing and walking and weakness for at least 3 days.  After his scheduled dialysis today while the patient was leaving with his wife, she said he looks so deathly ill that she insisted that he go to the emergency room for evaluation.  The patient presented to [facility] Emergency Room where he was evaluated, found to have a hemoglobin of 5.9 and an INR of 6.0, but to be otherwise cardiovascularly stable. Because they had no gastroenterologist on staff, the patient was transferred and was accepted to [facility] for further evaluation and treatment.  The patient denies any chest pains or shortness of breath.  He denies any palpitations or diaphoresis.  He says he does feel some headed at times.  He had no lower extremity edema.  The patient was evaluated by his cardiologist 1 week ago, had a 2-D echo and lower extremity Dopplers because of complaints of weakness in his legs.  By reports, his lower extremity Dopplers were normal.  Of note, the patient has a bioprosthetic mitral valve because of severe mitral regurgitation.  The patient reports he does take a baby aspirin, but  denies using Goody Powders or any over-the-counter pain medications at all.  He has had no stomach pains and no history of gastrointestinal bleed.  He does not recall passing any black or bloody stool.  He has had no nausea nor vomiting.  PAST MEDICAL HISTORY: 1. Brown Hemoccult positive stool. 2. End-stage renal disease, dialysis dependent for the past 2 years,     gets dialyzed on Tuesday, Thursday, and Saturday. 3. Coronary artery disease, status post CABG in May 2008. 4. He is status post mitral valve replacement with a 31-mm Edwards     pericardial tissue valve in May 2008. 5. Status post closure of the left atrial appendage in May 2008. 6. Left and right atrial maze procedure in May 2008. 7. Atrial fibrillation, on chronic Coumadin anticoagulation. 8. History of tobacco abuse. 9. Diabetes type 2. 10.History of gout. 11.Hyperlipidemia. 12.Hypertension.  MEDICATIONS: 1. Glimepiride 8 mg daily. 2. Lantus 38 units at bedtime. 3. Allopurinol 200 mg daily. 4. Amiodarone 200 mg twice daily. 5. Aspirin 81 mg daily. 6. Digoxin 0.125 mg daily. 7. Diltiazem extended release 180 mg daily. 8. Metoprolol 50 mg daily. 9. Torsemide 60 mg daily. 10.Atorvastatin 20 mg daily. 11.Warfarin 2.5 mg daily. 12.Multivitamins with minerals daily.  ALLERGIES:  AMLODIPINE which causes a cough.  SOCIAL HISTORY:  He used to smoke 1-2 packs per week, but has now discontinued.  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 Lives with his wife who is present at this interview.  FAMILY HISTORY:  Significant for diabetes in both parents and all siblings.  Denies any history of renal disease or gastrointestinal disease in parents or siblings.  REVIEW OF SYSTEMS:  Other than noted above, unremarkable.  PHYSICAL EXAMINATION:  GENERAL:  A middle-aged African American gentleman lying in the bed. VITAL SIGNS:  His temperature is 98.0, pulse 72, respirations 16, blood pressure 131/56.  He is  saturating at 100% on 2 liters. HEENT:  His pupils are round and equal.  Mucous membranes are pale. Anicteric. NECK:  No cervical lymphadenopathy or thyromegaly.  No carotid bruit. CHEST:  Clear to auscultation bilaterally. CARDIOVASCULAR:  Irregularly irregular rhythm.  He has 3/6 systolic murmur. ABDOMEN:  Obese, soft, and nontender.  Normal abdominal bowel sounds. EXTREMITIES:  He has right forearm fistula with a functional thrill.  He has no edema.  Dorsalis pedis pulses are 1+ bilaterally.  The feet are warm, but the toes are white. CENTRAL NERVOUS SYSTEM: Cranial nerves II-XII are grossly intact.  He has no focal lateralizing signs. RECTAL:  Black melanotic stool.  LABORATORY DATA:  Labs accompanying him from [place] show a white count of 9.8, hemoglobin 5.9, hematocrit 18.5, MCV 98.2, and platelets 233. His sodium is 137, potassium 3.7, chloride 98, CO2 of 29, BUN 23, creatinine 3.97, and glucose 179.  His cardiac enzymes are unremarkable. His INR was 6.0.  He was transfused 1 pack of red blood cells en route to [facility].  CT scan of head done at [facility] shows no acute intracranial abnormality, moderate age advanced generalized atrophy, severe chronic microvascular ischemic changes of the white matter diffusely, old focal cortical stroke in the left parietal lobe vertex, old lacunar strokes in the left basal ganglia with associated dystrophic calcifications.  His EKG shows atrial fibrillation with a rate of 78.  Of note, the patient received at 10 mg of vitamin K subcutaneously prior to transfer and also received 1 unit of packed red cells.  ASSESSMENT: 1. Upper gastrointestinal bleed as evidenced by acute anemia,     dizziness, and melanotic stool. 2. Diabetes type 2. 3. End-stage renal disease, dialysis dependent. 4. Chronic atrial fibrillation. 5. Bioprosthetic mitral valve. 6. Coagulopathy secondary to Coumadin. 7. Hypertension. 8.  Hyperlipidemia. 9. 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  PLAN:  Since this gentleman has already received 1 unit of packed red cells and repeat hemoglobin 1 hour later was still only 6.0, we will give 2 units of FFP stat, 5 mg of vitamin K intravenously, and transfuse 2 units of packed red cells.  We will consult nephrologist since he may need dialysis for further transfusion.  We will consult gastroenterologist as he will probably need upper endoscopy in the morning since his bleeding seems to be ongoing.  Consult [facility] since we are discontinuing his critical Coumadin.  We may need to restart this patient on heparin as soon as he is stable.  We will keep him n.p.o. and place him on a sensitive sliding scale for his diabetes.  We will manage his atrial fibrillation and high blood pressure as the need arises.  Other plans as per orders.     Vania Rea, M.D.     LC/MEDQ  D:  02/11/2011  T:  02/11/2011  Job:  [id number]  cc:   Madelin Rear. Sherwood Gambler, MD Fax: [phone]  Jorja Loa, M.D. Fax: [phone]  Nicki Guadalajara, M.D. Fax: [phone]  Doylene Canning. Ladona Ridgel, MD [address] [place] [address]  Electronically Signed by Vania Rea M.D. on 02/13/2011 03:33:36 AM

## 2011-02-14 ENCOUNTER — Inpatient Hospital Stay (HOSPITAL_COMMUNITY): Payer: Medicare Other

## 2011-02-14 HISTORY — PX: TRANSTHORACIC ECHOCARDIOGRAM: SHX275

## 2011-02-14 LAB — GLUCOSE, CAPILLARY
Glucose-Capillary: 98 mg/dL (ref 70–99)
Glucose-Capillary: 190 mg/dL — ABNORMAL HIGH (ref 70–99)

## 2011-02-14 LAB — CBC
HCT: 26.4 % — ABNORMAL LOW (ref 39.0–52.0)
MCV: 90.1 fL (ref 78.0–100.0)
Platelets: 145 10*3/uL — ABNORMAL LOW (ref 150–400)
RBC: 2.93 MIL/uL — ABNORMAL LOW (ref 4.22–5.81)
RDW: 15.8 % — ABNORMAL HIGH (ref 11.5–15.5)
WBC: 11.6 10*3/uL — ABNORMAL HIGH (ref 4.0–10.5)

## 2011-02-14 LAB — URINALYSIS, ROUTINE W REFLEX MICROSCOPIC
Bilirubin Urine: NEGATIVE
Ketones, ur: NEGATIVE mg/dL
Leukocytes, UA: NEGATIVE
Nitrite: NEGATIVE
Urobilinogen, UA: 0.2 mg/dL (ref 0.0–1.0)

## 2011-02-14 LAB — HEMOGLOBIN AND HEMATOCRIT, BLOOD
HCT: 24.4 % — ABNORMAL LOW (ref 39.0–52.0)
HCT: 24.9 % — ABNORMAL LOW (ref 39.0–52.0)
Hemoglobin: 8 g/dL — ABNORMAL LOW (ref 13.0–17.0)
Hemoglobin: 8.2 g/dL — ABNORMAL LOW (ref 13.0–17.0)

## 2011-02-14 LAB — DIFFERENTIAL
Basophils Absolute: 0 10*3/uL (ref 0.0–0.1)
Eosinophils Relative: 0 % (ref 0–5)
Lymphocytes Relative: 6 % — ABNORMAL LOW (ref 12–46)
Lymphs Abs: 0.7 10*3/uL (ref 0.7–4.0)
Neutrophils Relative %: 83 % — ABNORMAL HIGH (ref 43–77)

## 2011-02-14 LAB — PREPARE FRESH FROZEN PLASMA

## 2011-02-14 LAB — BASIC METABOLIC PANEL
BUN: 72 mg/dL — ABNORMAL HIGH (ref 6–23)
CO2: 24 mEq/L (ref 19–32)
Chloride: 96 mEq/L (ref 96–112)
Creatinine, Ser: 10.71 mg/dL — ABNORMAL HIGH (ref 0.50–1.35)
GFR calc Af Amer: 6 mL/min — ABNORMAL LOW (ref >60)
Potassium: 4.5 mEq/L (ref 3.5–5.1)

## 2011-02-14 NOTE — Consult Note (Signed)
[name] [name], [name] NO.:  [id number]  MEDICAL RECORD NO.:  [id number]  LOCATION:                                 FACILITY:  PHYSICIAN:  Jonette Eva, M.D.     DATE OF BIRTH:  1951/06/26  DATE OF CONSULTATION:  02/12/2011 DATE OF DISCHARGE:                                CONSULTATION   REFERRING PHYSICIAN:  Dr. Orvan Falconer.  PRIMARY PHYSICIAN:  Madelin Rear. Sherwood Gambler, MD  PRIMARY CARDIOLOGIST:  Pearletha Furl. Alanda Amass, MD  REASON FOR CONSULTATION:  Melena.  HISTORY OF PRESENT ILLNESS:  [name] [name] is a 60 year old male who has significant past medical history of coronary artery disease, severe mitral regurgitation, and patent foramen ovale.  He had coronary artery bypass graft x4 with mitral valve replacement with pericardial tissue valve and closure of foramen ovale.  This occurred in 2008.  He has been maintained on Coumadin for AFIB.  He also has history of end- stage renal disease for the last 2 years.  His dialysis is Tuesday, Thursday, Saturday.  He was in his usual state of health until Thursday when he began to see black tarry stools.  He may have been having black tarry stools for a longer period of time than that.  He continued his aspirin and he continued his Coumadin.  He was not taking a PPI.  He denies any alcohol abuse.  Denies any nausea, vomiting, difficulty swallowing, abdominal pain, or bright red blood per rectum.  He has no abdominal pain.  PAST MEDICAL HISTORY: 1. Diabetes. 2. Atrial fib, on chronic Coumadin. 3. Gout. 4. Hyperlipidemia. 5. Hypertension.  PAST SURGICAL HISTORY:  Per HPI.  ALLERGIES:  NORVASC causes a cough, otherwise, he has no known drug allergies.  MEDICATIONS:  Lasix, NovoLog sliding scale, Protonix q.12 h.  FAMILY HISTORY:  He denies any family history of colon cancer or colon polyps.  SOCIAL HISTORY:  He is married.  He does not use tobacco or alcohol products.  REVIEW OF SYSTEMS:  He has never had a  colonoscopy.  His review of systems is per the HPI, otherwise, all systems are negative.  PHYSICAL EXAMINATION:  VITAL SIGNS:  T-max 98.7, systolic blood pressure 128-111. GENERAL:  He is in no apparent distress, alert and oriented x4. HEENT:  Atraumatic, normocephalic.  Pupils are reactive to light.  His mouth has no oral lesions. NECK:  Full range of motion.  No lymphadenopathy. LUNGS:  Clear to auscultation bilaterally. CARDIOVASCULAR:  Regular rhythm.  Unable to appreciate a murmur. ABDOMEN:  Bowel sounds are present, soft, nontender, nondistended.  No rebound or guarding. EXTREMITIES:  No edema. NEUROLOGIC:  He has no focal neurologic deficits.  LABORATORY DATA:  Initial hemoglobin 6 and increased to 7 after three units of packed red blood cells.  Platelets 165.  INR 5.91 and 1.72 after FFP and 10 units of vitamin K.  BUN 50, creatinine 7.67, potassium 3.9, calcium 8.5, magnesium 1.9.  Cardiac enzymes negative x2.  RADIOGRAPHY:  None.  ASSESSMENT:  [name] [name] is a 60 year old male with many days worth of black tarry stools.  He has been hemodynamically stable.  Differential diagnosis includes Dieulafoy lesion,  peptic ulcer disease, or erosive esophagitis or gastritis.  Thank you for allowing me to see [name] [name] in consultation.  My recommendations will follow.  RECOMMENDATIONS: 1. EGD today. 2. Continue PPI. 3. Hold aspirin, Coumadin, and Lovenox.  Mechanical DVT prophylaxis.     Jonette Eva, M.D.     SF/MEDQ  D:  02/12/2011  T:  02/12/2011  Job:  [id number]  cc:   Madelin Rear. Sherwood Gambler, MD Fax: [phone]  Richard A. Alanda Amass, M.D. Fax: [phone]  Electronically Signed by Jonette Eva M.D. on 02/14/2011 01:49:35 PM

## 2011-02-15 ENCOUNTER — Encounter: Payer: Self-pay | Admitting: Urgent Care

## 2011-02-15 DIAGNOSIS — K922 Gastrointestinal hemorrhage, unspecified: Secondary | ICD-10-CM

## 2011-02-15 LAB — CBC
HCT: 25.1 % — ABNORMAL LOW (ref 39.0–52.0)
MCHC: 32.3 g/dL (ref 30.0–36.0)
MCV: 89.6 fL (ref 78.0–100.0)
Platelets: 141 10*3/uL — ABNORMAL LOW (ref 150–400)
RDW: 15.5 % (ref 11.5–15.5)

## 2011-02-15 LAB — DIFFERENTIAL
Basophils Absolute: 0 10*3/uL (ref 0.0–0.1)
Eosinophils Absolute: 0.1 10*3/uL (ref 0.0–0.7)
Eosinophils Relative: 2 % (ref 0–5)
Lymphocytes Relative: 13 % (ref 12–46)
Monocytes Absolute: 0.8 10*3/uL (ref 0.1–1.0)

## 2011-02-15 LAB — GLUCOSE, CAPILLARY
Glucose-Capillary: 213 mg/dL — ABNORMAL HIGH (ref 70–99)
Glucose-Capillary: 168 mg/dL — ABNORMAL HIGH (ref 70–99)

## 2011-02-15 LAB — URINE CULTURE
Colony Count: NO GROWTH
Culture  Setup Time: 201206191305

## 2011-02-15 LAB — BASIC METABOLIC PANEL
BUN: 53 mg/dL — ABNORMAL HIGH (ref 6–23)
Creatinine, Ser: 9.21 mg/dL — ABNORMAL HIGH (ref 0.50–1.35)
GFR calc non Af Amer: 6 mL/min — ABNORMAL LOW (ref >60)
Glucose, Bld: 161 mg/dL — ABNORMAL HIGH (ref 70–99)
Potassium: 3.8 mEq/L (ref 3.5–5.1)

## 2011-02-15 LAB — HEMOGLOBIN AND HEMATOCRIT, BLOOD: HCT: 27.8 % — ABNORMAL LOW (ref 39.0–52.0)

## 2011-02-16 ENCOUNTER — Inpatient Hospital Stay (HOSPITAL_COMMUNITY): Payer: Medicare Other

## 2011-02-16 LAB — DIFFERENTIAL
Basophils Absolute: 0 10*3/uL (ref 0.0–0.1)
Eosinophils Relative: 4 % (ref 0–5)
Lymphocytes Relative: 16 % (ref 12–46)
Lymphs Abs: 1.2 10*3/uL (ref 0.7–4.0)
Monocytes Absolute: 0.9 10*3/uL (ref 0.1–1.0)
Monocytes Relative: 12 % (ref 3–12)
Neutro Abs: 5.2 10*3/uL (ref 1.7–7.7)

## 2011-02-16 LAB — CBC
HCT: 25.4 % — ABNORMAL LOW (ref 39.0–52.0)
Hemoglobin: 8.3 g/dL — ABNORMAL LOW (ref 13.0–17.0)
MCH: 28.9 pg (ref 26.0–34.0)
MCHC: 32.7 g/dL (ref 30.0–36.0)
MCV: 88.5 fL (ref 78.0–100.0)
RDW: 15.1 % (ref 11.5–15.5)

## 2011-02-16 LAB — GLUCOSE, CAPILLARY: Glucose-Capillary: 283 mg/dL — ABNORMAL HIGH (ref 70–99)

## 2011-02-17 LAB — CBC
HCT: 28.8 % — ABNORMAL LOW (ref 39.0–52.0)
MCV: 89.2 fL (ref 78.0–100.0)
Platelets: 222 10*3/uL (ref 150–400)
RBC: 3.23 MIL/uL — ABNORMAL LOW (ref 4.22–5.81)
RDW: 15.2 % (ref 11.5–15.5)
WBC: 8.4 10*3/uL (ref 4.0–10.5)

## 2011-02-17 LAB — GLUCOSE, CAPILLARY
Glucose-Capillary: 145 mg/dL — ABNORMAL HIGH (ref 70–99)
Glucose-Capillary: 259 mg/dL — ABNORMAL HIGH (ref 70–99)
Glucose-Capillary: 209 mg/dL — ABNORMAL HIGH (ref 70–99)

## 2011-02-17 LAB — CULTURE, BLOOD (ROUTINE X 2): Culture  Setup Time: 201206202023

## 2011-02-17 LAB — DIFFERENTIAL
Basophils Absolute: 0 K/uL (ref 0.0–0.1)
Basophils Relative: 0 % (ref 0–1)
Eosinophils Absolute: 0.3 K/uL (ref 0.0–0.7)
Eosinophils Relative: 3 % (ref 0–5)
Lymphocytes Relative: 16 % (ref 12–46)
Lymphs Abs: 1.3 K/uL (ref 0.7–4.0)
Monocytes Absolute: 1.1 K/uL — ABNORMAL HIGH (ref 0.1–1.0)
Monocytes Relative: 13 % — ABNORMAL HIGH (ref 3–12)
Neutro Abs: 5.6 K/uL (ref 1.7–7.7)
Neutrophils Relative %: 67 % (ref 43–77)

## 2011-02-17 LAB — BASIC METABOLIC PANEL WITH GFR
BUN: 42 mg/dL — ABNORMAL HIGH (ref 6–23)
CO2: 29 meq/L (ref 19–32)
Calcium: 9.3 mg/dL (ref 8.4–10.5)
Chloride: 94 meq/L — ABNORMAL LOW (ref 96–112)
Creatinine, Ser: 8.31 mg/dL — ABNORMAL HIGH (ref 0.50–1.35)
GFR calc Af Amer: 8 mL/min — ABNORMAL LOW (ref >60)
GFR calc non Af Amer: 7 mL/min — ABNORMAL LOW (ref >60)
Glucose, Bld: 132 mg/dL — ABNORMAL HIGH (ref 70–99)
Potassium: 4.2 meq/L (ref 3.5–5.1)
Sodium: 135 meq/L (ref 135–145)

## 2011-02-17 NOTE — Consult Note (Signed)
[name] [name], [name] [name] NO.:  [id number]  MEDICAL RECORD NO.:  [id number]  LOCATION:  A320                          FACILITY:  [facility]  PHYSICIAN:  Meila Berke A. Alanda Amass, M.D.DATE OF BIRTH:  07-12-1951  DATE OF CONSULTATION:  02/13/2011 DATE OF DISCHARGE:                                CONSULTATION   HISTORY OF PRESENT ILLNESS:  [name] [name] is a 60 year old divorced African American father of one with two grandchildren.  Quit smoking in 2008. He has a [place] cardiac history and has been followed by me in [place] term. He has chronic - permanent atrial fibrillation, on chronic Coumadin therapy.  He had bioprosthetic mitral valve replacement with pericardial valve for severe MR along with CABG x4, left atrial closure and right-sided Maze procedure because of preoperative AF with rapid VR by Dr. Tyrone Sage in May of 2008.  He has chronic renal failure, on dialysis Tuesday, Thursday and Saturday, getting dialyzed and needing for right upper extremity AV fistula that has been functioning normally.  He was admitted to [facility].  He was last seen by me December 26, 2010, and was doing well clinically and we readjusted his medications, increasing the diltiazem to 180 and adding Lopressor and decreasing his amiodarone to 200 a day (for rate control) for his chronic atrial fibrillation.  There is no overt heart failure and no history of GI bleeding.  He presented to the hospital with 3 to 4 days of profound weakness, fatigue, lightheadedness and dizziness.  He was seen initially at [facility] and in the evening, was found to have a hemoglobin of 5.9 with an INR of 6.  He was transferred to [facility] and he was transfused with __________ for symptomatic anemia.  He had underlying atrial fibrillation which was chronic and no overt CHF associated with this.  He underwent endoscopy by Dr. 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  Three polyps were removed by snare cautery.  The duodenum was normal and no fresh blood in the duodenum. It was recommended that he hold his anticoagulation for 7 days and his aspirin for 14 days and be placed on PPI and scheduled for outpatient colonoscopy.  Since that time along with transfusion, the patient has done well.  She has not had any heart failure and no symptoms from his atrial fibrillation.  He is continued on dialysis and has been seen by Dr. Kristian Covey in the hospital.  He has not had a recurrent GI bleeding. There has been no chest pain to suggest ischemia and his AF has been well controlled.  His strength has improved considerably and he is getting ready for discharge now.  His last echo on Jan 05, 2010 which showed an EF of 55% with borderline concentric hypertrophy.  Bioprosthetic valve was functioning normally and well seated and there was no significant MR.  He had no other valvular disease.  RVSP was 13.  He had right wrist Cimino fistula placed at [facility] for dialysis July 2010 and as mentioned, has been undergoing dialysis in the  evening 3 days a week.  His cholesterol has been under good control and he has tolerated his medications well.  He has underlying left bundle-branch block with secondary ST-T changes and good LV function with his atrial fibrillation.  Last Cardiolite was November 2011 and was felt to be low risk.  At his preoperative catheterization, he had three-vessel proximal and distal coronary artery disease with 4+ MR.  REVIEW OF SYSTEMS:  He has associated IDDM, on insulin therapy.  He has had hyperuricemia with remote gout, none recently and he has been tolerating his medications well.  He is on PPI now.  His Coumadin is on hold.  He has been anticoagulated [place]-term.  PRIOR OUTPATIENT MEDICATIONS: 1. Lanoxin 0.125. 2. Glyburide 4. 3. Amiodarone 200. 4. Lipitor 20. 5.  Coumadin. 6. Folic acid. 7. One baby aspirin. 8. Allopurinol 100. 9. Diltiazem 180. 10.Lantus insulin 30 daily. 11.Renvela tablets with meals for his renal failure.  CURRENT MEDICATIONS: 1. Digoxin is continued. 2. His Cardizem is on hold. 3. Aspirin and Coumadin are on hold. 4. Glyburide and insulin are on hold and will be followed up.  PHYSICAL EXAMINATION:  VITAL SIGNS:  Blood pressure stable in the 130 range.  Weight is stable.  There is no edema. CHEST:  Clear to P&A. HEART:  The PMIs in the left sixth ICS near the [facility], felt as a localized heave.  Rhythm is irregularly irregular but well controlled at 70 per minute.  There is a short 2/6 systolic murmur at the left sternal border.  No diastolic murmur or rubs.  No carotid bruits and his thyroid is not large.  Liver, spleen, and kidney are not palpable. ABDOMEN:  Nontender. MUSCULOSKELETAL:  There is no edema and pedal pulses are intact.  His AV fistula is functioning well.  There are no pathologic reflexes.  There is paradoxical splitting of second heart sound to go along with his left bundle branch block.  He is a large man, 6 feet 2 inches.  From a clinical and cardiac standpoint, [name] [name] is getting along quite well with no angina or heart failure.  He has improved significantly with transfusion.  The recommendation of GI is to be kept off his aspirin for 14 days and hold his Coumadin for 7 days and continue PPI. He is going to get outpatient colonoscopy and probably may need followup endoscopy and see Dr. Darrick Penna and her colleagues back as an outpatient. I made arrangements for him to see me back in followup as well to assess his medical therapy for his atrial fibrillation and hypertension. Fortunately, the biopsies are still pending from his recent endoscopy with further recommendations pending.  I certainly appreciate the excellent care from the GI, Renal and hospitalist services.  He is due to be discharged tomorrow.   If there any problems or questions in the interim, please contact my office.     Zosia Lucchese A. Alanda Amass, M.D.     RAW/MEDQ  D:  02/13/2011  T:  02/13/2011  Job:  [id number]  cc:   Jorja Loa, M.D. Fax: [phone]  Jonette Eva, M.D. [address] [place] , [place] [address]  Madelin Rear. Sherwood Gambler, MD Fax: [phone]  Electronically Signed by Susa Griffins M.D. on 02/17/2011 10:20:16 AM

## 2011-02-18 ENCOUNTER — Inpatient Hospital Stay (HOSPITAL_COMMUNITY): Payer: Medicare Other

## 2011-02-18 NOTE — Discharge Summary (Signed)
  [name], [name] [name] NO.:  [id number]  MEDICAL RECORD NO.:  [id number]  LOCATION:  A320                          FACILITY:  [facility]  PHYSICIAN:  Wilson Singer, M.D.DATE OF BIRTH:  1950-11-19  DATE OF ADMISSION:  02/11/2011 DATE OF DISCHARGE:  LH                              DISCHARGE SUMMARY   ADDENDUM  I have reviewed this patient's medical chart and examined the patient and today, he is stable for discharge.  On physical examination today, temperature 97.8, blood pressure 115/61, pulse 60, saturation 100% on room air.  Currently, he is having dialysis and once he has finished his dialysis, he will be able to be discharged home with followup and medication adjustments as already dictated and done by Dr. Elliot Cousin.     Wilson Singer, M.D.     NCG/MEDQ  D:  02/18/2011  T:  02/18/2011  Job:  [id number]  Electronically Signed by Lilly Cove M.D. on 02/18/2011 11:32:46 AM

## 2011-02-21 NOTE — Discharge Summary (Signed)
NAMEOMRAN, KEELIN NO.:  [id number]  MEDICAL RECORD NO.:  [id number]  LOCATION:  A320                          FACILITY:  [facility]  PHYSICIAN:  Elliot Cousin, M.D.    DATE OF BIRTH:  07/19/1951  DATE OF ADMISSION:  02/11/2011 DATE OF DISCHARGE:  06/23/2012LH                         DISCHARGE SUMMARY-REFERRING   DISCHARGE DIAGNOSES: 1. Upper gastrointestinal bleeding secondary to multiple ulcerated     polypoid lesions in the stomach, status post removal of the three     polyps by snare cautery per EGD by Dr. Darrick Penna on February 12, 2011. 2. Severe acute blood loss anemia.  The patient's hemoglobin was 6.0     on admission.  At the time of this dictation, it is 9.2.  The     patient was transfused 3 units of packed red blood cells. 3. Enterococcus bacteremia, sensitive to vancomycin and gentamicin.     The patient will need to have a another set of blood cultures     ordered in 2 weeks following antibiotic treatment. 4. Coagulopathy secondary to Coumadin.  The patient's INR was 5.91 on     admission.  He was treated with vitamin K and fresh frozen plasma. 5. Chronic atrial fibrillation. 6. Bradycardia, secondary to rate limiting medications. 7. History of bioprosthetic mitral valve in May 2008. 8. Coronary artery disease, status post four-vessel CABG in the past. 9. Cardiomyopathy with an ejection fraction of 40% to 45% per 2-D     echocardiogram on February 14, 2011. 10.End-stage renal disease.  The patient receives hemodialysis on     Tuesdays, Thursdays and Saturdays. 11.Type 2 diabetes mellitus.  The patient's hemoglobin A1c was 6.5. 12.Hypertension.  DISCHARGE MEDICATIONS: 1. Epogen 10,000 units per mL, 12,000 units intravenously with     dialysis on Tuesdays, Thursdays and Saturdays. 2. Gentamicin 40 mg/mL, 100 mg intravenously on Tuesdays, Thursdays     and Saturdays for a total of 2 weeks with dialysis. 3. Protonix 40 mg b.i.d. (new medication). 4.  Vancomycin 1000 mg intravenously on Tuesdays, Thursdays and     Saturdays with hemodialysis for a total of 2 weeks. 5. Amiodarone 200 mg 1 tablet daily.  The dose was decreased from     b.i.d. 6. Amaryl 4 mg b.i.d.  The patient was advised to not take this     medication if his blood sugar was less than 125. 7. Allopurinol 100 mg 2 tablets daily. 8. Atorvastatin 20 mg at bedtime. 9. Lantus insulin 38 units at bedtime. 10.Metoprolol 50 mg daily. 11.Multivitamin with iron once daily. 12.Renvela 800 mg 1 tablet four times daily. 13.Torsemide 20 mg 3 tablets daily. 14.Restart aspirin at 81 mg daily on February 26, 2011. 15.Restart warfarin at 2.5 mg at bedtime on March 23, 2011 or after     follow-up evaluation with Dr. Alanda Amass. 16.Stop digoxin. 17.Stop diltiazem.  DISCHARGE DISPOSITION:  The patient is currently stable.  He is not quite ready for hospital discharge yet.  His heart rate is being monitored overnight.  If stable and improved, he will be discharged to home tomorrow.  He has an appointment to follow up with Dr. Alanda Amass on February 21, 2011 at 1:45 p.m.  He will follow up with Dr. Sherwood Gambler on February 28, 2011 at 10:45 a.m.  Dr. Darrick Penna office will call him with a follow-up appointment.  CONSULTATIONS: 1. Jonette Eva MD 2. Jorja Loa, MD 3. Susa Griffins, MD  PROCEDURE PERFORMED: 1. EGD with snare cautery polypectomy on February 12, 2011.  The results     revealed a normal esophagus without evidence of Barrett's mass,     erosion, ulceration, or stricture.  Multiple ulcerated polypoid     lesions in the stomach.  He had a pink colored gastric fluid     suggesting previous GI bleeding which has now resolved.  No fresh     blood or old blood seen in the stomach.  Three polyps were removed     via snare cautery.  Normal duodenal bulb and second portion of the     duodenum. 2. 2-D echocardiogram on February 14, 2011.  The results revealed left     ventricular size was normal.   Moderate concentric hypertrophy.     Systolic function was mildly to moderately reduced.  Ejection     fraction within the range of 40% to 45%.  Ventricular septum showed     abnormal function, dyssynergy, and paradox.  Very mild aortic valve     stenosis.  Mitral valve with bioprosthesis present.  Mild to     moderate mitral valve stenosis.  HISTORY OF PRESENT ILLNESS:  The patient is a 60 year old man with a past medical history significant for coronary artery disease, type 2 diabetes mellitus, and end-stage renal disease, who presented to the emergency department on February 11, 2011 with a chief complaint of dizziness and weakness for approximately 3 days.  He initially presented to [facility] emergency department where he was evaluated.  He was found to have a hemoglobin of 5.9 and an INR of greater than six. Because they did not have a gastroenterologist on staff, he was transferred to [facility] for further evaluation and management.  HOSPITAL COURSE: 1. UPPER GI BLEEDING, GASTRIC POLYPS, AND ACUTE BLOOD LOSS ANEMIA.     During the initial assessment, the patient's hemoglobin was found     to be 6.0, his hematocrit was 18.7, and his INR was 5.91.     He is chronically anticoagulated because of atrial fibrillation.     He was started on treatment with Protonix 40 mg IV q.12 hours.  He     was transfused 2 units of packed red blood cells during the first     24 hours of the hospitalization.  He was given 2 units of fresh     frozen plasma and 5 mg of vitamin K to reverse the Coumadin.  His     hemoglobin/hematocrit and INR were monitored closely.     Gastroenterologist, Dr. Darrick Penna was consulted.  She evaluated the     patient and proceeded with an EGD.  The results were dictated     above.  Following the results, she recommended that the patient     continued to be treated with Protonix 40 mg b.i.d. for at least 3     months.  She recommended that aspirin be withheld  for 14 days and     for Coumadin to be withheld for 7 days.  The patient's diet was     advanced.  He tolerated the advancement well.  He was transfused     another unit of packed red blood cells  for a total of 3 units in     all.  As of the time of this dictation, his hemoglobin is 9.2. 2. ENTEROCOCCUS BACTEREMIA.  The patient became febrile with a     temperature of 102 several days after admission.  Blood cultures, a     urine culture, and a chest x-ray were ordered.  He was subsequently     started empirically on vancomycin and gentamicin.  His urine     culture proved to be negative.  His chest x-ray revealed no     findings suggestive of pneumonia.  His blood cultures eventually     grew out Enterococcus species.  It was sensitive to ampicillin or     vancomycin plus gentamicin.  A 2-D echocardiogram was ordered to     assess for gross vegetations.  There was no evidence of gross     vegetations.  Dr. Lendell Caprice discussed a potential TEE evaluation     with cardiologist Dr. Tresa Endo.  Based on the notes written, Dr. Tresa Endo     did not believe the patient needed a TEE.  Following the start of     vancomycin and gentamicin, the patient became completely afebrile.     His white blood cell count normalized.  He is asymptomatic.  I     discussed the length of therapy with infectious diseases physician,     Dr. Ninetta Lights.  He recommended that the patient be continued on     vancomycin and gentamicin for a total of 2 weeks.  When completed,     the patient will need to have another set of blood cultures     ordered.  Both the vancomycin and gentamicin can be given with     dialysis by Dr. Kristian Covey. 3. END-STAGE RENAL DISEASE.  The patient underwent hemodialysis     several times during the hospitalization under the guidance of Dr.     Kristian Covey. 4. BRADYCARDIA, CHRONIC ATRIAL FIBRILLATION, CORONARY ARTERY DISEASE,     AND CARDIOMYOPATHY.  The patient was continued on all of his     cardiac  medications with the exception of Coumadin.  Cardiologist,     Dr. Alanda Amass was consulted.  Following his evaluation, he agreed     with medical management.  His consultation occurred prior to the     patient be coming febrile.  He noted that the patient would need to     be off Coumadin for a total of 7 days and off aspirin for total of     14 days.  The patient became bradycardic today with a heart rate     falling into the 30s transiently.  His heart rate had been well     within normal limits until today.  So far, his heart rate has     ranged from the low 30s up to the mid 50s.  I discussed this new     finding with Dr. Alanda Amass on the telephone.  I reviewed the rate     limiting medications with him.  He noted that the patient should     not be on digoxin.  He should also be on amiodarone only once daily     instead of twice daily.  Diltiazem which had been held during the     hospitalization will continued to be withheld per his     recommendation.  He should continue on metoprolol 50 mg as     previously prescribed.  Based on his recommendations and our     conversation, the patient should be discharged on amiodarone 200 mg     once daily, metoprolol 50 mg daily, and digoxin and Cardizem should     be discontinued.  The patient will follow up with Dr. Alanda Amass in     4 days for further evaluation and management.  Of note, the patient     was asymptomatic with regards to his bradycardia.  His EKG today     revealed atrial fibrillation with a heart rate of 50 beats per     minute. 5. TYPE 2 DIABETES MELLITUS.  The patient was continued on treatment     with Lantus and Amaryl, but the doses of both were decreased.     Sliding scale NovoLog was added before each meal and at bedtime.     The patient's hemoglobin A1c was noted to be 6.5.  His capillary     blood glucose was well controlled up until today.  Now that he is     eating better, the dose of Lantus will be  increased.  ADDENDUM DISPOSITION:  The patient is approaching hospital discharge. Discharge today was delayed due to his bradycardia.  If he remains stable and improved overnight and in the morning, he could safely be discharged to home.  My colleague Dr. Karilyn Cota will evaluate him to make the final decision.  The patient's discharge medications are subject to change based on Dr. Patty Sermons follow up evaluation tomorrow morning.     Elliot Cousin, M.D.     DF/MEDQ  D:  02/17/2011  T:  02/17/2011  Job:  [id number]  cc:   Jonette Eva, M.D. [address] [place] , [place] [address]  Jorja Loa, M.D. Fax: [phone]  Richard A. Alanda Amass, M.D. Fax: [phone]  Madelin Rear. Sherwood Gambler, MD Fax: [phone]  Electronically Signed by Elliot Cousin M.D. on 02/21/2011 12:02:44 PM

## 2011-03-08 NOTE — Op Note (Signed)
  NAMERUCKER, PRIDGEON NO.:  [id number]  MEDICAL RECORD NO.:  [id number]  LOCATION:  A320                          FACILITY:  [facility]  PHYSICIAN:  Jonette Eva, M.D.     DATE OF BIRTH:  1950/09/13  DATE OF PROCEDURE:  02/12/2011 DATE OF DISCHARGE:                              OPERATIVE REPORT   REFERRING PHYSICIAN:  Corinna L. Lendell Caprice, MD  PRIMARY CARE PHYSICIAN:  Madelin Rear. Sherwood Gambler, MD  CARDIOLOGIST:  Pearletha Furl. Alanda Amass, MD  PROCEDURE:  Esophagogastroduodenoscopy with snare cautery polypectomy.  INDICATION FOR EXAM:  Mr. Thorpe is a 60 year old male who presented with melena for several days.  He is on Coumadin for atrial fibrillation.  He has end-stage renal disease and receives dialysis on Tuesday, Thursday, and Saturday.  FINDINGS: 1. Normal esophagus without evidence of Barrett mass, erosion,     ulceration, or stricture. 2. Multiple ulcerated polypoid lesions in the stomach (antrum).  He     had a pink-colored gastric fluid suggesting previous GI bleeding     which was now resolved.  No fresh blood or old blood seen in the     stomach.  Three polyps were removed via snare cautery (25 watts). 3. Normal duodenal bulb and second portion of the duodenum.  Normal     ampulla.  No old blood or fresh blood seen in the duodenum.  DIAGNOSES:  Melena most likely secondary to ulcerated gastric lesion in the setting of anticoagulation.  The patient most likely has H. pylori gastritis.  Biopsies are pending.  RECOMMENDATIONS: 1. Hold aspirin for 14 days. 2. Hold anticoagulation for 7 days. 3. B.i.d. PPI for 3 months then daily. 4. The patient should have an outpatient colonoscopy. 5. We will advance his diet.  If his hemoglobin remained stable and he     remains without melena, we will discharge to home on tomorrow.  MEDICATIONS: 1. Fentanyl 50 mcg IV. 2. Versed 4 mg IV.  PROCEDURE TECHNIQUE:  Physical exam was performed.  Informed consent was obtained  from the patient after explaining benefits, risks, and alternatives to the procedure.  The patient was connected to monitor and placed in left lateral position.  Continuous oxygen was provided by nasal cannula and IV medicine administered through an indwelling cannula.  After administration of sedation, the patient's esophagus was intubated.  The scope was advanced under direct visualization to the second portion of the duodenum.  Scope was removed slowly by carefully examining color, texture, anatomy, and integrity mucosa on the way out. The patient was recovered in endoscopy and discharged to the floor in satisfactory condition.   PATH: CHRONIC ACTIVE GASTRITIS, NO H. pylori  Jonette Eva, M.D.     SF/MEDQ  D:  02/12/2011  T:  02/12/2011  Job:  [id number]  cc:   Madelin Rear. Sherwood Gambler, MD Fax: [phone]  Richard A. Alanda Amass, M.D. Fax: [phone]  Electronically Signed by Jonette Eva M.D. on 03/08/2011 11:18:25 AM

## 2011-03-22 NOTE — Consult Note (Signed)
NAMEANTONIS, LOR NO.:  [id number]  MEDICAL RECORD NO.:  [id number]  LOCATION:  A320                          FACILITY:  [facility]  PHYSICIAN:  Jorja Loa, M.D.DATE OF BIRTH:  02/19/1951  DATE OF CONSULTATION: DATE OF DISCHARGE:                                CONSULTATION   REASON FOR CONSULTATION:  End-stage renal disease.  Mr. Hunt is a patient who is well-known to me who has history of coronary artery disease status post CABG and history of mitral valve replacement, atrial fibrillation, end-stage renal disease on hemodialysis initially went to [facility] because of dizziness. According to the patient, he was feeling dizzy and weak the last couple of 3 days and yesterday after he finished dialysis, he felt so weak that he could not move, hence he decided to go to the emergency room.  In the emergency room, his hemoglobin was 5.9 and his INR was 6 and he was heme positive and therefore the patient is sent to [facility] for evaluation by GI.  Presently he has received 3 units of blood.  The patient seems to be feeling better.  He denies any nausea or vomiting.  Appetite is good.  PAST MEDICAL HISTORY: 1. History of end-stage renal disease status post hemodialysis     yesterday.  Usually he is due Tuesday, Thursday, Saturday. 2. History of coronary artery disease status post CABG. 3. History of arterial fibrillation. 4. History of mitral valve replacement. 5. History of CABG. 6. History of gout. 7. History of hyperlipidemia. 8. History of hypertension. 9. History of anemia, anemia of chronic disease.  He is on Epogen. 10.History of hyperphosphatemia.  His medications consist of insulin and also he is on Protonix 40 mg IV and he is getting also Zofran on p.r.n. basis.  The patient is on glimepiride 8 mg p.o. daily.  He was also on amiodarone 200 mg p.o. b.i.d., aspirin 81 mg p.o. daily, diltiazem 180 mg p.o. daily, metoprolol 50 mg p.o.  daily.  He is also on Rena-Vite 1 tablet p.o. daily and he is also on torsemide.  ALLERGIES:  He has history of allergy to AMLODIPINE.  SOCIAL HISTORY:  He is ex-smoker.  He used to work in [employer]. He denies any history of alcohol abuse or illicit drug use.  FAMILY HISTORY:  History of renal failure.  REVIEW OF SYSTEMS:  His main complaint seems to be becoming weak, tired the last 3 days.  Yesterday after dialysis, he was so weak he could not stand.  He denies any nausea or vomiting.  He denies any shortness of breath.  He denies any fevers, chills, or sweating.  He states that he has some dark stools the last couple of days but he did not have any frank bleeding.  He did not have any abdominal pain and also he did not have any history of bleeding.  PHYSICAL EXAMINATION:  GENERAL:  The patient is alert in no apparent distress. VITAL SIGNS:  His blood pressure is 127/63, temperature 98.4, pulse of 64, respiratory rate is 18. CHEST:  Clear to auscultation. CARDIAC:  Revealed regular rate and rhythm.  No murmur. ABDOMEN:  Soft, positive bowel sounds. EXTREMITIES:  He does not have any edema.  His hemoglobin yesterday before blood transfusion was 6, hematocrit was 18.7, INR is 5.9, and PT was 53.7.  ASSESSMENT: 1. Anemia seems to be secondary to gastrointestinal bleeding.     Presently he is Hemoccult positive.  He has received 3 units of     blood transfusion.  Repeat H&H is pending. 2. Coagulopathy.  Most likely this is from Coumadin.  Coumadin is     stopped.  He has received 1 unit of FFP. 3. History of end-stage renal disease.  He is status post hemodialysis     yesterday.  At this moment, blood work which was obtained during     dictation show potassium 3.7, creatinine 3.97, BUN of 23 post     dialysis.  There is no any other blood work.  As stated above, the     patient has received dialysis yesterday. 4. History of hypertension, blood pressure seems to be  controlled very     well. 5. History of coronary artery disease status post CABG, asymptomatic. 6. History of atrial fibrillation.  Heart rate is controlled. 7. History of diabetes.  He is on insulin. 8. History of gout. 9. History of hyperlipidemia.  RECOMMENDATIONS:  At this moment, we will continue his present management and we will possibly check his BMET and CBC tomorrow and I will make arrangement for the patient to get dialysis on Tuesday, his regular schedule.  We will continue his other medications and we will follow the patient.     Jorja Loa, M.D.     BB/MEDQ  D:  02/12/2011  T:  02/12/2011  Job:  [id number]  Electronically Signed by Jorja Loa M.D. on 03/22/2011 04:34:09 PM

## 2011-05-04 LAB — CALCIUM
Calcium: 8.8 mg/dL
Phosphorus: 4.8 mg/dL (ref 2.5–4.9)
Potassium: 4.4 mmol/L

## 2011-05-04 LAB — IRON AND TIBC
Iron: 35
TIBC: 307
UIBC: 272

## 2011-05-04 LAB — CBC WITH DIFFERENTIAL/PLATELET
ALT: 17 U/L (ref 10–40)
AST: 22 U/L
CO2: 25 mmol/L
Total Protein: 6.4 g/dL
platelet count: 260

## 2011-05-04 LAB — ANEMIA PANEL 7: HCT, POC: 30.9

## 2011-05-11 ENCOUNTER — Encounter: Payer: Self-pay | Admitting: Urgent Care

## 2011-05-19 ENCOUNTER — Inpatient Hospital Stay: Payer: Medicare Other | Admitting: Urgent Care

## 2011-05-26 ENCOUNTER — Ambulatory Visit (INDEPENDENT_AMBULATORY_CARE_PROVIDER_SITE_OTHER): Payer: Medicare Other | Admitting: Urgent Care

## 2011-05-26 ENCOUNTER — Encounter: Payer: Self-pay | Admitting: Urgent Care

## 2011-05-26 VITALS — BP 115/76 | HR 92 | Temp 97.4°F | Ht 76.0 in | Wt 235.0 lb

## 2011-05-26 DIAGNOSIS — D638 Anemia in other chronic diseases classified elsewhere: Secondary | ICD-10-CM

## 2011-05-26 DIAGNOSIS — Z1211 Encounter for screening for malignant neoplasm of colon: Secondary | ICD-10-CM

## 2011-05-26 DIAGNOSIS — K922 Gastrointestinal hemorrhage, unspecified: Secondary | ICD-10-CM | POA: Insufficient documentation

## 2011-05-26 NOTE — Assessment & Plan Note (Signed)
Pt would like to pursue average risk screening colonoscopy.  He is on coumadin & aspirin w/ hx a fib & mitral valve replacement w/ pericardial tissue valve & closure foramen ovale.  I have discussed risks & benefits which include, but are not limited to, bleeding, infection, perforation & drug reaction.  The patient agrees with this plan & written consent will be obtained.     I will discuss management of coumadin & aspirin prior to the procedure w/ Dr Darrick Penna. Decrease LANTUS to 19 units the day before the procedure  Hold insulin the day of procedure and bring all meds/insulin to hospital Cut your amaryl in half the day before the procedure Hold the day of procedure until after procedure Call if any problems w/ blood sugars

## 2011-05-26 NOTE — Assessment & Plan Note (Addendum)
Will request most recent CBC from dialysis [facility]

## 2011-05-26 NOTE — Progress Notes (Signed)
Primary Care Physician:  Cassell Smiles., MD Primary Gastroenterologist:  Dr. Darrick Penna Cardiologist:  Dr. Alanda Amass Nephrologist:  Dr Fausto Skillern  Chief Complaint  Patient presents with  . Follow-up    HPI:  [name] [name] is a 60 y.o. male here for follow up for UGI bleed secondary to chronic active gastritis & multiple gastric polyps while anticoagulated for hx mitral valve & atrial fibrillation.  He tells me he has done well in past 3 months.   Denies any upper GI symptoms including heartburn, indigestion, nausea, vomiting, dysphagia, odynophagia or anorexia.   Denies any lower GI symptoms including constipation, diarrhea, rectal bleeding, melena or weight loss. On coumadin & aspirin.  Would like to proceed w/ colonoscopy at this time as he has never has a screening exam.  Hx anemia of chronic kidney disease.  Past Medical History  Diagnosis Date  . UGI bleed 02/12/11    on anticoagulation; EGD w/snare polypectomy-multiple polypoid lesions antrum(benign), Chronic active gastritis (NEGATIVE H pylori)  . Anemia, chronic disease   . Sepsis due to enterococcus 02/11/11  . Atrial fibrillation     coumadin  . CAD (coronary artery disease)     Dr. Alanda Amass  . Cardiomyopathy     40-45% EF 6/12  . End stage kidney disease     T/TH/SAT dialyis, Dr Fausto Skillern  . Diabetes mellitus   . Hypertension   . Hyperlipemia   . Gout     Past Surgical History  Procedure Date  . Cardiac catheterization   . Mitral valve replacement 12/2006    bioprosthetic  . Coronary artery bypass graft     Current Outpatient Prescriptions  Medication Sig Dispense Refill  . allopurinol (ZYLOPRIM) 100 MG tablet Take 100 mg by mouth daily.       Marland Kitchen amiodarone (PACERONE) 200 MG tablet Take 200 mg by mouth daily.       Marland Kitchen aspirin 81 MG tablet Take 81 mg by mouth daily.        Marland Kitchen atorvastatin (LIPITOR) 20 MG tablet Take 20 mg by mouth daily.       . B Complex-C-Zn-Folic Acid (DIALYVITE/ZINC PO) Take by mouth.        .  folic acid (FOLVITE) 1 MG tablet Take 1 mg by mouth daily.        Marland Kitchen glimepiride (AMARYL) 4 MG tablet 4 mg daily before breakfast.       . LANTUS SOLOSTAR 100 UNIT/ML injection Inject 38 Units into the skin daily.       . metoprolol (LOPRESSOR) 50 MG tablet Take 50 mg by mouth 2 (two) times daily.       . pantoprazole (PROTONIX) 40 MG tablet Take 40 mg by mouth daily.       Marland Kitchen RENVELA 800 MG tablet Take 800 mg by mouth 3 (three) times daily with meals.       . torsemide (DEMADEX) 20 MG tablet Take 20 mg by mouth daily.       Marland Kitchen warfarin (COUMADIN) 2.5 MG tablet Take 2.5 mg by mouth daily.         Allergies as of 05/26/2011  . (No Known Allergies)   Family History:There is no known family history of colorectal carcinoma , liver disease, or inflammatory bowel disease.  Problem Relation Age of Onset  . Diabetes Mother   . Diabetes Father   . Diabetes Sister    History   Social History  . Marital Status: Married    Spouse Name: N/A  Number of Children: 1  . Years of Education: N/A   Occupational History  . retired    Social History Main Topics  . Smoking status: Former Smoker -- 1.0 packs/day    Types: Cigarettes    Quit date: 05/25/2006  . Smokeless tobacco: None   Comment: quit about 5 yrs  . Alcohol Use: No     former user  . Drug Use: No  . Sexually Active: None  Review of Systems: Gen: Denies any fever, chills, sweats, anorexia, fatigue, weakness, malaise, weight loss, and sleep disorder CV: Denies chest pain, angina, palpitations, syncope, orthopnea, PND, peripheral edema, and claudication. Resp: Denies dyspnea at rest, dyspnea with exercise, cough, sputum, wheezing, coughing up blood, and pleurisy. GI: Denies vomiting blood, jaundice, and fecal incontinence.   Denies dysphagia or odynophagia. Derm: Denies rash, itching, dry skin, hives, moles, warts, or unhealing ulcers.  Psych: Denies depression, anxiety, memory loss, suicidal ideation, hallucinations, paranoia, and  confusion. Heme: Denies bruising, bleeding, and enlarged lymph nodes.  Physical Exam: BP 115/76  Pulse 105  Temp(Src) 97.4 F (36.3 C) (Temporal)  Ht 6\' 4"  (1.93 m)  Wt 235 lb (106.595 kg)  BMI 28.61 kg/m2 General:   Alert,  Well-developed, well-nourished, pleasant and cooperative in NAD. Accompanied by his wife today. Head:  Normocephalic and atraumatic. Eyes:  Sclera clear, no icterus.   Conjunctiva pink. Mouth:  No deformity or lesions, dentition normal. Neck:  Supple; no masses or thyromegaly. Heart:  Regular rate and rhythm; no murmurs, clicks, rubs,  or gallops. Abdomen:  Soft, nontender and nondistended. No masses, hepatosplenomegaly or hernias noted. Normal bowel sounds, without guarding, and without rebound.   Msk:  Symmetrical without gross deformities. Normal posture. Pulses:  Normal pulses noted. Extremities:  Without clubbing or edema. Neurologic:  Alert and  oriented x4;  grossly normal neurologically. Skin:  Intact without significant lesions or rashes. Cervical Nodes:  No significant cervical adenopathy. Psych:  Alert and cooperative. Normal mood and affect.

## 2011-05-26 NOTE — Assessment & Plan Note (Signed)
Resolved. Continue protonix daily.

## 2011-05-29 NOTE — Progress Notes (Signed)
Cc to PCP 

## 2011-05-30 NOTE — Progress Notes (Signed)
Labs reviewed from dialysis 05/04/11 Hgb 10.3, Hct 35.4, ferritin 166, iron sat 11, iron 35, TIBC 307, UIBC 272

## 2011-05-31 ENCOUNTER — Telehealth: Payer: Self-pay

## 2011-05-31 NOTE — Telephone Encounter (Signed)
[facility] Cardiology and spoke with Elnita Maxwell, the nurse. She will check with Dr. Alanda Amass tomorrow and give me a call back.  She will check on the following:  1. Can pt come off coumadin for a screening colonoscopy? 2. If so, for how [place]? 3. Will he need bridging? 4. If so, what are his recommendations?

## 2011-05-31 NOTE — Progress Notes (Signed)
Pt may continue ASA for TCS.  Consult Cardiology regarding Coumadin.

## 2011-06-02 NOTE — Telephone Encounter (Signed)
Ok, please schedule colonoscopy as planned. Hold coumadin 4 days prior to colonscopy.

## 2011-06-02 NOTE — Patient Instructions (Signed)
OK to hold coumadin 4 days prior to procedure per Dr Alanda Amass

## 2011-06-02 NOTE — Telephone Encounter (Signed)
Spoke with nurse, Elnita Maxwell, at Dr. Kandis Cocking. She said Dr. Alanda Amass said OK for pt to hold Coumadin 3-4 days prior to colonoscopy. No pre-opt or no bridge needed.

## 2011-06-05 ENCOUNTER — Other Ambulatory Visit: Payer: Self-pay | Admitting: Gastroenterology

## 2011-06-05 DIAGNOSIS — D649 Anemia, unspecified: Secondary | ICD-10-CM

## 2011-06-05 NOTE — Telephone Encounter (Signed)
PT SCHEDULED FOR TCS ON 06/23/11- WENT OVER INSTRUCTIONS, PT VOICED HIS UNDERSTANDING- COPY MAILED

## 2011-06-05 NOTE — Telephone Encounter (Signed)
Pt informed Dr. Alanda Amass said it would be OK to hold coumadin x 4 days prior to colonoscopy. Crystal will call him with date, time and instructions.

## 2011-06-05 NOTE — Telephone Encounter (Signed)
Called pt to schedule- he was not home- I left a message with his wife- She will have him call back to schedule

## 2011-06-09 LAB — PROTIME-INR
INR: 2.6 — ABNORMAL HIGH
Prothrombin Time: 28.4 — ABNORMAL HIGH

## 2011-06-22 MED ORDER — SODIUM CHLORIDE 0.45 % IV SOLN
Freq: Once | INTRAVENOUS | Status: AC
  Administered 2011-06-23: 1000 mL via INTRAVENOUS

## 2011-06-23 ENCOUNTER — Other Ambulatory Visit: Payer: Self-pay | Admitting: Gastroenterology

## 2011-06-23 ENCOUNTER — Encounter (HOSPITAL_COMMUNITY): Payer: Self-pay | Admitting: *Deleted

## 2011-06-23 ENCOUNTER — Ambulatory Visit (HOSPITAL_COMMUNITY)
Admission: RE | Admit: 2011-06-23 | Discharge: 2011-06-23 | Disposition: A | Discharge status: Home / Self Care | Payer: Medicare Other | Source: Ambulatory Visit | Attending: Gastroenterology | Admitting: Gastroenterology

## 2011-06-23 ENCOUNTER — Encounter (HOSPITAL_COMMUNITY)
Admission: RE | Disposition: A | Discharge status: Home / Self Care | Payer: Self-pay | Source: Ambulatory Visit | Attending: Gastroenterology

## 2011-06-23 DIAGNOSIS — D649 Anemia, unspecified: Secondary | ICD-10-CM

## 2011-06-23 DIAGNOSIS — D126 Benign neoplasm of colon, unspecified: Secondary | ICD-10-CM

## 2011-06-23 DIAGNOSIS — Z01812 Encounter for preprocedural laboratory examination: Secondary | ICD-10-CM | POA: Insufficient documentation

## 2011-06-23 DIAGNOSIS — Z7901 Long term (current) use of anticoagulants: Secondary | ICD-10-CM | POA: Insufficient documentation

## 2011-06-23 DIAGNOSIS — D128 Benign neoplasm of rectum: Secondary | ICD-10-CM

## 2011-06-23 DIAGNOSIS — D129 Benign neoplasm of anus and anal canal: Secondary | ICD-10-CM | POA: Insufficient documentation

## 2011-06-23 DIAGNOSIS — K5730 Diverticulosis of large intestine without perforation or abscess without bleeding: Secondary | ICD-10-CM

## 2011-06-23 DIAGNOSIS — D509 Iron deficiency anemia, unspecified: Secondary | ICD-10-CM | POA: Insufficient documentation

## 2011-06-23 HISTORY — PX: COLONOSCOPY: SHX5424

## 2011-06-23 SURGERY — COLONOSCOPY
Anesthesia: Moderate Sedation

## 2011-06-23 MED ORDER — DEXTROSE IN LACTATED RINGERS 5 % IV SOLN
INTRAVENOUS | Status: DC
  Administered 2011-06-23: 14:00:00 via INTRAVENOUS

## 2011-06-23 MED ORDER — FENTANYL CITRATE 0.05 MG/ML IJ SOLN
INTRAMUSCULAR | Status: DC | PRN
  Administered 2011-06-23: 25 ug via INTRAVENOUS

## 2011-06-23 MED ORDER — MEPERIDINE HCL 100 MG/ML IJ SOLN
INTRAMUSCULAR | Status: AC
  Filled 2011-06-23: qty 1

## 2011-06-23 MED ORDER — MIDAZOLAM HCL 5 MG/5ML IJ SOLN
INTRAMUSCULAR | Status: AC
  Filled 2011-06-23: qty 10

## 2011-06-23 MED ORDER — EPINEPHRINE HCL 0.1 MG/ML IJ SOLN
INTRAMUSCULAR | Status: AC
  Filled 2011-06-23: qty 20

## 2011-06-23 MED ORDER — SPOT INK MARKER SYRINGE KIT
PACK | SUBMUCOSAL | Status: DC | PRN
  Administered 2011-06-23: 4 mL via SUBMUCOSAL

## 2011-06-23 MED ORDER — MIDAZOLAM HCL 5 MG/5ML IJ SOLN
INTRAMUSCULAR | Status: DC | PRN
  Administered 2011-06-23: 2 mg via INTRAVENOUS
  Administered 2011-06-23: 1 mg via INTRAVENOUS

## 2011-06-23 MED ORDER — FENTANYL CITRATE 0.05 MG/ML IJ SOLN
INTRAMUSCULAR | Status: AC
  Filled 2011-06-23: qty 2

## 2011-06-23 MED ORDER — SODIUM CHLORIDE 0.9 % IJ SOLN
INTRAMUSCULAR | Status: DC | PRN
  Administered 2011-06-23: 16:00:00

## 2011-06-23 NOTE — H&P (Signed)
BP Pulse Temp(Src) Ht Wt BMI    115/76  92  97.4 F (36.3 C) (Temporal)  6\' 4"  (1.93 m)  235 lb (106.595 kg)  28.61 kg/m2       Progress Notes     Lorenza Burton, NP  05/26/2011 12:43 PM  Signed Primary Care Physician:  Cassell Smiles., MD Primary Gastroenterologist:  Dr. Darrick Penna Cardiologist:  Dr. Alanda Amass Nephrologist:  Dr Fausto Skillern    Chief Complaint   Patient presents with   .  Follow-up      HPI:  [name] [name] is a 60 y.o. male here for follow up for UGI bleed secondary to chronic active gastritis & multiple gastric polyps while anticoagulated for hx mitral valve & atrial fibrillation.  He tells me he has done well in past 3 months.   Denies any upper GI symptoms including heartburn, indigestion, nausea, vomiting, dysphagia, odynophagia or anorexia.   Denies any lower GI symptoms including constipation, diarrhea, rectal bleeding, melena or weight loss. On coumadin & aspirin.  Would like to proceed w/ colonoscopy at this time as he has never has a screening exam.  Hx anemia of chronic kidney disease.    Past Medical History   Diagnosis  Date   .  UGI bleed  02/12/11       on anticoagulation; EGD w/snare polypectomy-multiple polypoid lesions antrum(benign), Chronic active gastritis (NEGATIVE H pylori)   .  Anemia, chronic disease     .  Sepsis due to enterococcus  02/11/11   .  Atrial fibrillation         coumadin   .  CAD (coronary artery disease)         Dr. Alanda Amass   .  Cardiomyopathy         40-45% EF 6/12   .  End stage kidney disease         T/TH/SAT dialyis, Dr Fausto Skillern   .  Diabetes mellitus     .  Hypertension     .  Hyperlipemia     .  Gout         Past Surgical History   Procedure  Date   .  Cardiac catheterization     .  Mitral valve replacement  12/2006       bioprosthetic   .  Coronary artery bypass graft         Current Outpatient Prescriptions   Medication  Sig  Dispense  Refill   .  allopurinol (ZYLOPRIM) 100 MG tablet  Take 100 mg by  mouth daily.          Marland Kitchen  amiodarone (PACERONE) 200 MG tablet  Take 200 mg by mouth daily.          Marland Kitchen  aspirin 81 MG tablet  Take 81 mg by mouth daily.           Marland Kitchen  atorvastatin (LIPITOR) 20 MG tablet  Take 20 mg by mouth daily.          .  B Complex-C-Zn-Folic Acid (DIALYVITE/ZINC PO)  Take by mouth.           .  folic acid (FOLVITE) 1 MG tablet  Take 1 mg by mouth daily.           Marland Kitchen  glimepiride (AMARYL) 4 MG tablet  4 mg daily before breakfast.          .  LANTUS SOLOSTAR 100 UNIT/ML injection  Inject  38 Units into the skin daily.          .  metoprolol (LOPRESSOR) 50 MG tablet  Take 50 mg by mouth 2 (two) times daily.          .  pantoprazole (PROTONIX) 40 MG tablet  Take 40 mg by mouth daily.          Marland Kitchen  RENVELA 800 MG tablet  Take 800 mg by mouth 3 (three) times daily with meals.          .  torsemide (DEMADEX) 20 MG tablet  Take 20 mg by mouth daily.          Marland Kitchen  warfarin (COUMADIN) 2.5 MG tablet  Take 2.5 mg by mouth daily.              Allergies as of 05/26/2011   .  (No Known Allergies)    Family History:There is no known family history of colorectal carcinoma , liver disease, or inflammatory bowel disease.   Problem  Relation  Age of Onset   .  Diabetes  Mother     .  Diabetes  Father     .  Diabetes  Sister         History       Social History   .  Marital Status:  Married       Spouse Name:  N/A       Number of Children:  1   .  Years of Education:  N/A       Occupational History   .  retired         Social History Main Topics   .  Smoking status:  Former Smoker -- 1.0 packs/day       Types:  Cigarettes       Quit date:  05/25/2006   .  Smokeless tobacco:  None     Comment: quit about 5 yrs   .  Alcohol Use:  No         former user   .  Drug Use:  No   .  Sexually Active:  None    Review of Systems: Gen: Denies any fever, chills, sweats, anorexia, fatigue, weakness, malaise, weight loss, and sleep disorder CV: Denies chest pain, angina, palpitations,  syncope, orthopnea, PND, peripheral edema, and claudication. Resp: Denies dyspnea at rest, dyspnea with exercise, cough, sputum, wheezing, coughing up blood, and pleurisy. GI: Denies vomiting blood, jaundice, and fecal incontinence.   Denies dysphagia or odynophagia. Derm: Denies rash, itching, dry skin, hives, moles, warts, or unhealing ulcers.   Psych: Denies depression, anxiety, memory loss, suicidal ideation, hallucinations, paranoia, and confusion. Heme: Denies bruising, bleeding, and enlarged lymph nodes.   Physical Exam: BP 115/76  Pulse 105  Temp(Src) 97.4 F (36.3 C) (Temporal)  Ht 6\' 4"  (1.93 m)  Wt 235 lb (106.595 kg)  BMI 28.61 kg/m2 General:   Alert,  Well-developed, well-nourished, pleasant and cooperative in NAD. Accompanied by his wife today. Head:  Normocephalic and atraumatic. Eyes:  Sclera clear, no icterus.   Conjunctiva pink. Mouth:  No deformity or lesions, dentition normal. Neck:  Supple; no masses or thyromegaly. Heart:  Regular rate and rhythm; no murmurs, clicks, rubs,  or gallops. Abdomen:  Soft, nontender and nondistended. No masses, hepatosplenomegaly or hernias noted. Normal bowel sounds, without guarding, and without rebound.    Msk:  Symmetrical without gross deformities. Normal posture. Pulses:  Normal pulses noted. Extremities:  Without clubbing or  edema. Neurologic:  Alert and  oriented x4;  grossly normal neurologically. Skin:  Intact without significant lesions or rashes. Cervical Nodes:  No significant cervical adenopathy. Psych:  Alert and cooperative. Normal mood and affect.       Leigh A Watson  05/29/2011  8:29 AM  Signed Cc to PCP  Lorenza Burton, NP  05/30/2011 12:20 PM  Signed Labs reviewed from dialysis 05/04/11 Hgb 10.3, Hct 35.4, ferritin 166, iron sat 11, iron 35, TIBC 307, UIBC 272  Jonette Eva, MD  05/31/2011 11:17 AM  Signed Pt may continue ASA for TCS.   Consult Cardiology regarding Coumadin.     UGI bleed Lorenza Burton, NP  05/26/2011 12:21 PM  Signed Resolved. Continue protonix daily.     Anemia, chronic disease Lorenza Burton, NP  05/26/2011 12:22 PM  Addendum Will request most recent CBC from dialysis [facility]  Previous Version  Screening for colon cancer Lorenza Burton, NP  05/26/2011 12:30 PM  Signed Pt would like to pursue average risk screening colonoscopy.  He is on coumadin & aspirin w/ hx a fib & mitral valve replacement w/ pericardial tissue valve & closure foramen ovale.  I have discussed risks & benefits which include, but are not limited to, bleeding, infection, perforation & drug reaction.  The patient agrees with this plan & written consent will be obtained.       I will discuss management of coumadin & aspirin prior to the procedure w/ Dr Darrick Penna. Decrease LANTUS to 19 units the day before the procedure   Hold insulin the day of procedure and bring all meds/insulin to hospital Cut your amaryl in half the day before the procedure Hold the day of procedure until after procedure Call if any problems w/ blood sugars

## 2011-06-28 ENCOUNTER — Telehealth: Payer: Self-pay | Admitting: Gastroenterology

## 2011-06-28 NOTE — Telephone Encounter (Signed)
Please call pt. He had OVER 10 simple adenomas removed from hIS colon. ONE WAS AN ADVANCED ADENOMA, but you do not have cancer. TCS in 3 years. High fiber diet. All first degree relatives need tcs at age 62 and then every 5 years. OPV in 3 mos. RESTART COUMADIN 10/31 AND RESUME HEPARIN WITH DIALYSIS 11/2.

## 2011-06-28 NOTE — Telephone Encounter (Signed)
Reminder in epic to have tcs in 3 years and to have follow up OV in 3 months

## 2011-06-28 NOTE — Telephone Encounter (Signed)
Results Cc to PCP & Dr. Alanda Amass

## 2011-06-28 NOTE — Telephone Encounter (Signed)
Pt informed

## 2011-06-29 ENCOUNTER — Encounter (HOSPITAL_COMMUNITY): Payer: Self-pay | Admitting: Gastroenterology

## 2011-08-31 ENCOUNTER — Encounter: Payer: Self-pay | Admitting: Gastroenterology

## 2011-11-27 HISTORY — PX: TRANSTHORACIC ECHOCARDIOGRAM: SHX275

## 2011-12-06 DIAGNOSIS — I5023 Acute on chronic systolic (congestive) heart failure: Secondary | ICD-10-CM

## 2011-12-27 ENCOUNTER — Ambulatory Visit (INDEPENDENT_AMBULATORY_CARE_PROVIDER_SITE_OTHER): Payer: Medicare Other | Admitting: Gastroenterology

## 2011-12-27 ENCOUNTER — Encounter: Payer: Self-pay | Admitting: Gastroenterology

## 2011-12-27 DIAGNOSIS — R079 Chest pain, unspecified: Secondary | ICD-10-CM | POA: Insufficient documentation

## 2011-12-27 DIAGNOSIS — K922 Gastrointestinal hemorrhage, unspecified: Secondary | ICD-10-CM

## 2011-12-27 DIAGNOSIS — K5900 Constipation, unspecified: Secondary | ICD-10-CM | POA: Insufficient documentation

## 2011-12-27 DIAGNOSIS — R1310 Dysphagia, unspecified: Secondary | ICD-10-CM

## 2011-12-27 DIAGNOSIS — D126 Benign neoplasm of colon, unspecified: Secondary | ICD-10-CM | POA: Insufficient documentation

## 2011-12-27 MED ORDER — LINACLOTIDE 145 MCG PO CAPS: 1.0000 | ORAL_CAPSULE | Freq: Every day | ORAL | Status: DC

## 2011-12-27 NOTE — Assessment & Plan Note (Signed)
RESOLVED. 

## 2011-12-27 NOTE — Assessment & Plan Note (Signed)
OCT 2012  TCS OCT 2015

## 2011-12-27 NOTE — Progress Notes (Signed)
Subjective:    Patient ID: [name] [name], male    DOB: Feb 04, 1951, 61 y.o.   MRN: [id number]  PCP: [facility]  HPI WAS HAVING CP BUT NOW GONE AFTER ABX. NO MELENA, BRBPR, OR HEMATEMESIS, PROBLEMS SWALLOWING. WHEN HE WAS IN THE HOSPITAL 2-3 WEEKS AGO AND HAD PROBLEMS SWALLOWING, BUT NOW SWALLOWING IS OKAY. BMS: A LITTLE HARD, IN HOSPITAL A LITTLE. NO STOOL SOFTENER. WILLING TO TAKE A PILL TO FIX THE BMs.  Past Medical History  Diagnosis Date  . UGI bleed 02/12/11    on anticoagulation; EGD w/snare polypectomy-multiple polypoid lesions antrum(benign), Chronic active gastritis (NEGATIVE H pylori)  . Anemia, chronic disease   . Sepsis due to enterococcus 02/11/11  . Atrial fibrillation     coumadin  . CAD (coronary artery disease)     Dr. Alanda Amass  . Cardiomyopathy     40-45% EF 6/12  . End stage kidney disease     T/TH/SAT dialyis, Dr Fausto Skillern  . Diabetes mellitus   . Hypertension   . Hyperlipemia   . Gout   . S/P patent foramen ovale closure     Past Surgical History  Procedure Date  . Cardiac catheterization   . Mitral valve replacement 12/2006    bioprosthetic  . Coronary artery bypass graft   . Colonoscopy 06/23/2011    Procedure: COLONOSCOPY;  Surgeon: Arlyce Harman, MD;  Location: [facility];  Service: Endoscopy;  Laterality: N/A;  1:15    No Known Allergies  Current Outpatient Prescriptions  Medication Sig Dispense Refill  . allopurinol (ZYLOPRIM) 100 MG tablet Take 100 mg by mouth daily.       Marland Kitchen amiodarone (PACERONE) 200 MG tablet Take 200 mg by mouth daily.       Marland Kitchen aspirin 81 MG tablet Take 81 mg by mouth daily.        Marland Kitchen atorvastatin (LIPITOR) 20 MG tablet Take 20 mg by mouth daily.       . B Complex-C-Zn-Folic Acid (DIALYVITE/ZINC PO) Take by mouth.        . folic acid (FOLVITE) 1 MG tablet Take 1 mg by mouth daily.        Marland Kitchen glimepiride (AMARYL) 4 MG tablet Take 4 mg by mouth 2 (two) times daily.        Marland Kitchen LANTUS SOLOSTAR 100 UNIT/ML injection Inject 38 Units into  the skin at bedtime.       . metoprolol (LOPRESSOR) 50 MG tablet Take 50 mg by mouth daily. Take 1.5 tablets daily       . pantoprazole (PROTONIX) 40 MG tablet Take 40 mg by mouth 2 (two) times daily.       Marland Kitchen RENVELA 800 MG tablet Take 800 mg by mouth 3 (three) times daily with meals.       . torsemide (DEMADEX) 20 MG tablet Take 60 mg by mouth daily.       Marland Kitchen warfarin (COUMADIN) 2.5 MG tablet Take 2.5 mg by mouth daily.      Marland Kitchen DISCONTD: glimepiride (AMARYL) 4 MG tablet 4 mg daily before breakfast.           Review of Systems     Objective:   Physical Exam  Vitals reviewed. Constitutional: He is oriented to person, place, and time. He appears well-developed. No distress.  HENT:  Head: Normocephalic and atraumatic.  Mouth/Throat: Oropharynx is clear and moist. No oropharyngeal exudate.  Eyes: Pupils are equal, round, and reactive to light. No scleral icterus.  Neck:  Normal range of motion. Neck supple.  Cardiovascular: Normal rate.   No murmur heard. Pulmonary/Chest: Effort normal and breath sounds normal. No respiratory distress.  Abdominal: Soft. Bowel sounds are normal. He exhibits no distension. There is no tenderness.  Musculoskeletal: He exhibits edema (TRACE BIL LE).  Lymphadenopathy:    He has no cervical adenopathy.  Neurological: He is alert and oriented to person, place, and time.       NO  NEW FOCAL DEFICITS   Psychiatric: He has a normal mood and affect.          Assessment & Plan:

## 2011-12-27 NOTE — Patient Instructions (Addendum)
FOLLOW A LOW FAT/DIABETIC DIET. SEE INFO BELOW ON A LOW FAT DIET.  FOLLOW REFLUX LIFESTYLE RECOMMENDATIONS. LOSE 10 TO 20 LBS. IT WILL HELP YOUR REFLUX, AND YOUR HEART.  CONTINUE PROTONIX. TAKE IT 30 MINUTES PRIOR TO YOUR MEALS.  CALL ME IF YOU HAVE PROBLEMS WITH CHEST PAIN. WE WILL GET A SWALLOWING STUDY TO CHECK FOR SPASMS.  TAKE LINZESS DAILY FOR CONSTIPATION. IT CAN CAUSE DIARRHEA.  I WILL REVIEW YOUR RECORDS FROM [place].  FOLLOW UP IN 3 MOS.   REFLUX LIFESTYLE RECOMMENDATIONS  Lifestyle and home remedies You may eliminate or reduce the frequency of heartburn by making the following lifestyle changes:    Control your weight. Being overweight is a major risk factor for heartburn and GERD. Excess pounds put pressure on your abdomen, pushing up your stomach and causing acid to back up into your esophagus.      Eat 4-6 small meals daily. This reduces pressure on the lower esophageal sphincter, helping to prevent the valve from opening and acid from washing back into your esophagus.      Loosen your belt. Clothes that fit tightly around your waist put pressure on your abdomen and the lower esophageal sphincter.      Eliminate heartburn triggers. Everyone has specific triggers. Common triggers such as fatty or fried foods, spicy food, tomato sauce, carbonated beverages, alcohol, chocolate, mint, garlic, onion, caffeine and nicotine may make heartburn worse.      Avoid stooping or bending. Tying your shoes is OK. Bending over for longer periods to weed your garden isn't, especially soon after eating.      Don't lie down after a meal. Wait at least three to four hours after eating before going to bed, and don't lie down right after eating.    Alternative medicine   Several home remedies exist for treating GERD, but they provide only temporary relief. They include drinking baking soda (sodium bicarbonate) added to water or drinking other fluids such as baking soda mixed with cream  of tartar and water.   Although these liquids create temporary relief by neutralizing, washing away or buffering acids, eventually they aggravate the situation by adding gas and fluid to your stomach, increasing pressure and causing more acid reflux. Further, adding more sodium to your diet may increase your blood pressure and add stress to your heart, and excessive bicarbonate ingestion can alter the acid-base balance in your body.   Low-Fat Diet BREADS, CEREALS, PASTA, RICE, DRIED PEAS, AND BEANS These products are high in carbohydrates and most are low in fat. Therefore, they can be increased in the diet as substitutes for fatty foods. They too, however, contain calories and should not be eaten in excess. Cereals can be eaten for snacks as well as for breakfast.  Include foods that contain fiber (fruits, vegetables, whole grains, and legumes). Research shows that fiber may lower blood cholesterol levels, especially the water-soluble fiber found in fruits, vegetables, oat products, and legumes. FRUITS AND VEGETABLES It is good to eat fruits and vegetables. Besides being sources of fiber, both are rich in vitamins and some minerals. They help you get the daily allowances of these nutrients. Fruits and vegetables can be used for snacks and desserts. MEATS Limit lean meat, chicken, [place], and fish to no more than 6 ounces per day. Beef, Pork, and Lamb Use lean cuts of beef, pork, and lamb. Lean cuts include:  Extra-lean ground beef.  Arm roast.  Sirloin tip.  Center-cut ham.  Round steak.  Loin  chops.  Rump roast.  Tenderloin.  Trim all fat off the outside of meats before cooking. It is not necessary to severely decrease the intake of red meat, but lean choices should be made. Lean meat is rich in protein and contains a highly absorbable form of iron. Premenopausal women, in particular, should avoid reducing lean red meat because this could increase the risk for low red blood cells  (iron-deficiency anemia).  Chicken and [place] These are good sources of protein. The fat of poultry can be reduced by removing the skin and underlying fat layers before cooking. Chicken and [place] can be substituted for lean red meat in the diet. Poultry should not be fried or covered with high-fat sauces. Fish and Shellfish Fish is a good source of protein. Shellfish contain cholesterol, but they usually are low in saturated fatty acids. The preparation of fish is important. Like chicken and [place], they should not be fried or covered with high-fat sauces. EGGS Egg whites contain no fat or cholesterol. They can be eaten often. Try 1 to 2 egg whites instead of whole eggs in recipes or use egg substitutes that do not contain yolk.  MILK AND DAIRY PRODUCTS Use skim or 1% milk instead of 2% or whole milk. Decrease whole milk, natural, and processed cheeses. Use nonfat or low-fat (2%) cottage cheese or low-fat cheeses made from vegetable oils. Choose nonfat or low-fat (1 to 2%) yogurt. Experiment with evaporated skim milk in recipes that call for heavy cream. Substitute low-fat yogurt or low-fat cottage cheese for sour cream in dips and salad dressings. Have at least 2 servings of low-fat dairy products, such as 2 glasses of skim (or 1%) milk each day to help get your daily calcium intake.  FATS AND OILS Butterfat, lard, and beef fats are high in saturated fat and cholesterol. These should be avoided.Vegetable fats do not contain cholesterol. AVOID coconut oil, palm oil, and palm kernel oil, WHICH are very high in saturated fats. These should be limited. These fats are often used in bakery goods, processed foods, popcorn, oils, and nondairy creamers. Vegetable shortenings and some peanut butters contain hydrogenated oils, which are also saturated fats. Read the labels on these foods and check for saturated vegetable oils.  Desirable liquid vegetable oils are corn oil, cottonseed oil, olive oil, canola  oil, safflower oil, soybean oil, and sunflower oil. Peanut oil is not as good, but small amounts are acceptable. Buy a heart-healthy tub margarine that has no partially hydrogenated oils in the ingredients. AVOID Mayonnaise and salad dressings often are made from unsaturated fats.  OTHER EATING TIPS Snacks  Most sweets should be limited as snacks. They tend to be rich in calories and fats, and their caloric content outweighs their nutritional value. Some good choices in snacks are graham crackers, melba toast, soda crackers, bagels (no egg), English muffins, fruits, and vegetables. These snacks are preferable to snack crackers, [place] fries, and chips. Popcorn should be air-popped or cooked in small amounts of liquid vegetable oil.  Desserts Eat fruit, low-fat yogurt, and fruit ices instead of pastries, cake, and cookies. Sherbet, angel food cake, gelatin dessert, frozen low-fat yogurt, or other frozen products that do not contain saturated fat (pure fruit juice bars, frozen ice pops) are also acceptable.   COOKING METHODS Choose those methods that use little or no fat. They include: Poaching.  Braising.  Steaming.  Grilling.  Baking.  Stir-frying.  Broiling.  Microwaving.  Foods can be cooked in a nonstick pan without  added fat, or use a nonfat cooking spray in regular cookware. Limit fried foods and avoid frying in saturated fat. Add moisture to lean meats by using water, broth, cooking wines, and other nonfat or low-fat sauces along with the cooking methods mentioned above. Soups and stews should be chilled after cooking. The fat that forms on top after a few hours in the refrigerator should be skimmed off. When preparing meals, avoid using excess salt. Salt can contribute to raising blood pressure in some people.  EATING AWAY FROM HOME Order entres, potatoes, and vegetables without sauces or butter. When meat exceeds the size of a deck of cards (3 to 4 ounces), the rest can be taken home  for another meal. Choose vegetable or fruit salads and ask for low-calorie salad dressings to be served on the side. Use dressings sparingly. Limit high-fat toppings, such as bacon, crumbled eggs, cheese, sunflower seeds, and olives. Ask for heart-healthy tub margarine instead of butter.

## 2011-12-27 NOTE — Assessment & Plan Note (Signed)
LIKELY DUE TO UNCONTROLLED GERD.  MONITOR.

## 2011-12-27 NOTE — Progress Notes (Signed)
Faxed to PCP

## 2011-12-27 NOTE — Assessment & Plan Note (Signed)
LIKELY DUE TO UNCONTROLLED GERD. SX RESOLVED WITH PROTONIX BID. DYSPHAGIA LIKELY RELATED TO UNCONTROLLED REFLUX, LESS LIKEY EMD OR ESOPHAGEAL SPASM. June 2012 EGD NO OBVIOUS STRICTURE OR MASS.  OBSERVE. BPE IF SX RETURN. PROTONIX BID. OPV IN 3 MOS.

## 2011-12-27 NOTE — Assessment & Plan Note (Signed)
IMPAIRS HIS QUALITY OF LIFE.  ADD LINZESS 145 MCG QD. SAMPLES #14 GIVEN. RX/COPAY CARD GIVEN AS WELL. PT WILL CALL IF MEDS TOO EXPENSIVE OR NOT COVERED. OPV IN 3 MOS.

## 2012-02-12 ENCOUNTER — Encounter: Payer: Self-pay | Admitting: Gastroenterology

## 2012-02-20 ENCOUNTER — Ambulatory Visit (HOSPITAL_COMMUNITY)
Admission: RE | Admit: 2012-02-20 | Discharge: 2012-02-20 | Disposition: A | Discharge status: Home / Self Care | Payer: Medicare Other | Source: Ambulatory Visit | Attending: Internal Medicine | Admitting: Internal Medicine

## 2012-02-20 ENCOUNTER — Other Ambulatory Visit (HOSPITAL_COMMUNITY): Payer: Self-pay | Admitting: Internal Medicine

## 2012-02-20 DIAGNOSIS — J90 Pleural effusion, not elsewhere classified: Secondary | ICD-10-CM | POA: Insufficient documentation

## 2012-02-20 DIAGNOSIS — M549 Dorsalgia, unspecified: Secondary | ICD-10-CM | POA: Insufficient documentation

## 2012-02-20 DIAGNOSIS — S32009A Unspecified fracture of unspecified lumbar vertebra, initial encounter for closed fracture: Secondary | ICD-10-CM | POA: Insufficient documentation

## 2012-02-20 DIAGNOSIS — X58XXXA Exposure to other specified factors, initial encounter: Secondary | ICD-10-CM | POA: Insufficient documentation

## 2012-04-05 ENCOUNTER — Other Ambulatory Visit: Payer: Self-pay | Admitting: Neurosurgery

## 2012-04-05 ENCOUNTER — Other Ambulatory Visit: Payer: Self-pay | Admitting: *Deleted

## 2012-04-05 DIAGNOSIS — IMO0002 Reserved for concepts with insufficient information to code with codable children: Secondary | ICD-10-CM

## 2012-04-10 ENCOUNTER — Ambulatory Visit
Admission: RE | Admit: 2012-04-10 | Discharge: 2012-04-10 | Disposition: A | Discharge status: Home / Self Care | Payer: Medicare Other | Source: Ambulatory Visit | Attending: *Deleted | Admitting: *Deleted

## 2012-04-10 DIAGNOSIS — IMO0002 Reserved for concepts with insufficient information to code with codable children: Secondary | ICD-10-CM

## 2012-04-16 ENCOUNTER — Encounter: Payer: Self-pay | Admitting: Gastroenterology

## 2012-04-17 ENCOUNTER — Ambulatory Visit (INDEPENDENT_AMBULATORY_CARE_PROVIDER_SITE_OTHER): Payer: Medicare Other | Admitting: Gastroenterology

## 2012-04-17 ENCOUNTER — Encounter: Payer: Self-pay | Admitting: Gastroenterology

## 2012-04-17 VITALS — BP 132/87 | HR 110 | Temp 97.3°F | Ht 76.0 in | Wt 234.0 lb

## 2012-04-17 DIAGNOSIS — Z1211 Encounter for screening for malignant neoplasm of colon: Secondary | ICD-10-CM

## 2012-04-17 DIAGNOSIS — R079 Chest pain, unspecified: Secondary | ICD-10-CM

## 2012-04-17 DIAGNOSIS — R1310 Dysphagia, unspecified: Secondary | ICD-10-CM

## 2012-04-17 NOTE — Progress Notes (Signed)
Subjective:    Patient ID: [name] [name], male    DOB: [birth year], 61 y.o.   MRN: [id number]  PCP: Sherwood Gambler  HPI Chest pain reoslved. Constipation improved with otc stool softener pill. BMs: 1x./day-nl formed, no straining. No probles swallowing. LINZESS WAS TOO EXPENSIVE.  Past Medical History  Diagnosis Date  . UGI bleed 02/12/11    on anticoagulation; EGD w/snare polypectomy-multiple polypoid lesions antrum(benign), Chronic active gastritis (NEGATIVE H pylori)  . Anemia, chronic disease   . Sepsis due to enterococcus 02/11/11  . Atrial fibrillation     coumadin  . CAD (coronary artery disease)     Dr. Alanda Amass  . Cardiomyopathy     40-45% EF 6/12  . End stage kidney disease     T/TH/SAT dialyis, Dr Fausto Skillern  . Diabetes mellitus   . Hypertension   . Hyperlipemia   . Gout   . S/P patent foramen ovale closure     Past Surgical History  Procedure Date  . Cardiac catheterization   . Mitral valve replacement 12/2006    bioprosthetic  . Coronary artery bypass graft   . Colonoscopy 06/23/2011    interenal hemorrhoids/diverticulosis  . Esophagogastroduodenoscopy 02/12/2011    CHRONIC ACTIVE GASTRITIS, NO H. pylori    No Known Allergies Current Outpatient Prescriptions  Medication Sig Dispense Refill  . allopurinol (ZYLOPRIM) 100 MG tablet Take 100 mg by mouth daily.       Marland Kitchen amiodarone (PACERONE) 200 MG tablet Take 200 mg by mouth daily.       Marland Kitchen aspirin 81 MG tablet Take 81 mg by mouth daily.        Marland Kitchen atorvastatin (LIPITOR) 20 MG tablet Take 20 mg by mouth daily.       . B Complex-C-Zn-Folic Acid (DIALYVITE/ZINC PO) Take by mouth.        . folic acid (FOLVITE) 1 MG tablet Take 1 mg by mouth daily.        Marland Kitchen glimepiride (AMARYL) 4 MG tablet Take 4 mg by mouth 2 (two) times daily.        Marland Kitchen LANTUS SOLOSTAR 100 UNIT/ML injection Inject 38 Units into the skin at bedtime.       . metoprolol (LOPRESSOR) 50 MG tablet Take 50 mg by mouth daily. Take 1.5 tablets daily       .  pantoprazole (PROTONIX) 40 MG tablet Take 40 mg by mouth 2 (two) times daily.       Marland Kitchen RENVELA 800 MG tablet Take 800 mg by mouth 3 (three) times daily with meals.       . torsemide (DEMADEX) 20 MG tablet Take 60 mg by mouth daily.       Marland Kitchen warfarin (COUMADIN) 2.5 MG tablet Take 2.5 mg by mouth daily.         Review of Systems     Objective:   Physical Exam  Vitals reviewed. Constitutional: He is oriented to person, place, and time. He appears well-developed and well-nourished. No distress.  HENT:  Head: Normocephalic and atraumatic.  Eyes: Pupils are equal, round, and reactive to light. No scleral icterus.  Neck: Normal range of motion. Neck supple.  Cardiovascular: Normal rate, regular rhythm and normal heart sounds.   Pulmonary/Chest: Effort normal and breath sounds normal. No respiratory distress.  Abdominal: Soft. Bowel sounds are normal. He exhibits no distension. There is no tenderness.  Neurological: He is alert and oriented to person, place, and time.       NO FOCAL DEFICITS  Psychiatric: He has a normal mood and affect.          Assessment & Plan:

## 2012-04-17 NOTE — Assessment & Plan Note (Signed)
TCS IN 2015. 

## 2012-04-17 NOTE — Assessment & Plan Note (Addendum)
RESOLVED WITH BID PPI. LIKELY DUE TO UNCONTROLLED REFLUX.  LOW FAT/DIABETIC DIET GERD HO RECOMMENDATION PROVIDED. PROTNIX BID. OPV IN 6 MOS. CALL IF SX RETURN.

## 2012-04-17 NOTE — Patient Instructions (Addendum)
IF YOUR REFLUX IS UNCONTROLLED YOU WILL HAVE CHEST PAIN AND TROUBLE SWALLOWING.   TO CONTROL REFLUX:   1. CONTINUE PROTONIX 30 MINUTES PRIOR TO MEALS TWICE DAILY.   2. FOLLOW A LOW FAT/DIABETIC DIET. SEE INFO BELOW ON LOW FAT DIET.   3. SEE RECOMMENDATIONS BELOW REGARDING CONTROLLING REFLUX.    Lifestyle and home remedies You may eliminate or reduce the frequency of heartburn by making the following lifestyle changes:    Control your weight. Being overweight is a major risk factor for heartburn and GERD. Excess pounds put pressure on your abdomen, pushing up your stomach and causing acid to back up into your esophagus.    YOUR BODY MASS INDEX IS 28 WHICH IS CONSISTENT WITH BEING OVERWEIGHT.    Eat smaller meals. 4 TO 6 MEALS A DAY. This reduces pressure on the lower esophageal sphincter, helping to prevent the valve from opening and acid from washing back into your esophagus.     Loosen your belt. Clothes that fit tightly around your waist put pressure on your abdomen and the lower esophageal sphincter.    Eliminate heartburn triggers. Everyone has specific triggers.     Common triggers such as fatty or fried foods, spicy food, tomato sauce, carbonated beverages, alcohol, chocolate, mint, garlic, onion, caffeine and nicotine may make heartburn worse.     Avoid stooping or bending. Tying your shoes is OK. Bending over for longer periods to weed your garden isn't, especially soon after eating.     Don't lie down after a meal. Wait at least three to four hours after eating before going to bed, and don't lie down right after eating.    Low-Fat Diet BREADS, CEREALS, PASTA, RICE, DRIED PEAS, AND BEANS These products are high in carbohydrates and most are low in fat. Therefore, they can be increased in the diet as substitutes for fatty foods. They too, however, contain calories and should not be eaten in excess. Cereals can be eaten for snacks as well as for breakfast.   FRUITS AND  VEGETABLES It is good to eat fruits and vegetables. Besides being sources of fiber, both are rich in vitamins and some minerals. They help you get the daily allowances of these nutrients. Fruits and vegetables can be used for snacks and desserts.  MEATS Limit lean meat, chicken, [place], and fish to no more than 6 ounces per day. Beef, Pork, and Lamb Use lean cuts of beef, pork, and lamb. Lean cuts include:  Extra-lean ground beef.  Arm roast.  Sirloin tip.  Center-cut ham.  Round steak.  Loin chops.  Rump roast.  Tenderloin.  Trim all fat off the outside of meats before cooking. It is not necessary to severely decrease the intake of red meat, but lean choices should be made. Lean meat is rich in protein and contains a highly absorbable form of iron. Premenopausal women, in particular, should avoid reducing lean red meat because this could increase the risk for low red blood cells (iron-deficiency anemia).  Chicken and [place] These are good sources of protein. The fat of poultry can be reduced by removing the skin and underlying fat layers before cooking. Chicken and [place] can be substituted for lean red meat in the diet. Poultry should not be fried or covered with high-fat sauces. Fish and Shellfish Fish is a good source of protein. Shellfish contain cholesterol, but they usually are low in saturated fatty acids. The preparation of fish is important. Like chicken and [place], they should not be  fried or covered with high-fat sauces. EGGS Egg whites contain no fat or cholesterol. They can be eaten often. Try 1 to 2 egg whites instead of whole eggs in recipes or use egg substitutes that do not contain yolk. MILK AND DAIRY PRODUCTS Use skim or 1% milk instead of 2% or whole milk. Decrease whole milk, natural, and processed cheeses. Use nonfat or low-fat (2%) cottage cheese or low-fat cheeses made from vegetable oils. Choose nonfat or low-fat (1 to 2%) yogurt. Experiment with evaporated skim milk  in recipes that call for heavy cream. Substitute low-fat yogurt or low-fat cottage cheese for sour cream in dips and salad dressings. Have at least 2 servings of low-fat dairy products, such as 2 glasses of skim (or 1%) milk each day to help get your daily calcium intake. FATS AND OILS Reduce the total intake of fats, especially saturated fat. Butterfat, lard, and beef fats are high in saturated fat and cholesterol. These should be avoided as much as possible. Vegetable fats do not contain cholesterol, but certain vegetable fats, such as coconut oil, palm oil, and palm kernel oil are very high in saturated fats. These should be limited. These fats are often used in bakery goods, processed foods, popcorn, oils, and nondairy creamers. Vegetable shortenings and some peanut butters contain hydrogenated oils, which are also saturated fats. Read the labels on these foods and check for saturated vegetable oils. Unsaturated vegetable oils and fats do not raise blood cholesterol. However, they should be limited because they are fats and are high in calories. Total fat should still be limited to 30% of your daily caloric intake. Desirable liquid vegetable oils are corn oil, cottonseed oil, olive oil, canola oil, safflower oil, soybean oil, and sunflower oil. Peanut oil is not as good, but small amounts are acceptable. Buy a heart-healthy tub margarine that has no partially hydrogenated oils in the ingredients. Mayonnaise and salad dressings often are made from unsaturated fats, but they should also be limited because of their high calorie and fat content. Seeds, nuts, peanut butter, olives, and avocados are high in fat, but the fat is mainly the unsaturated type. These foods should be limited mainly to avoid excess calories and fat. OTHER EATING TIPS Snacks  Most sweets should be limited as snacks. They tend to be rich in calories and fats, and their caloric content outweighs their nutritional value. Some good choices  in snacks are graham crackers, melba toast, soda crackers, bagels (no egg), English muffins, fruits, and vegetables. These snacks are preferable to snack crackers, [place] fries, TORTILLA CHIPS, and POTATO chips. Popcorn should be air-popped or cooked in small amounts of liquid vegetable oil. Desserts Eat fruit, low-fat yogurt, and fruit ices instead of pastries, cake, and cookies. Sherbet, angel food cake, gelatin dessert, frozen low-fat yogurt, or other frozen products that do not contain saturated fat (pure fruit juice bars, frozen ice pops) are also acceptable.  COOKING METHODS Choose those methods that use little or no fat. They include: Poaching.  Braising.  Steaming.  Grilling.  Baking.  Stir-frying.  Broiling.  Microwaving.  Foods can be cooked in a nonstick pan without added fat, or use a nonfat cooking spray in regular cookware. Limit fried foods and avoid frying in saturated fat. Add moisture to lean meats by using water, broth, cooking wines, and other nonfat or low-fat sauces along with the cooking methods mentioned above. Soups and stews should be chilled after cooking. The fat that forms on top after a few hours  in the refrigerator should be skimmed off. When preparing meals, avoid using excess salt. Salt can contribute to raising blood pressure in some people.  EATING AWAY FROM HOME Order entres, potatoes, and vegetables without sauces or butter. When meat exceeds the size of a deck of cards (3 to 4 ounces), the rest can be taken home for another meal. Choose vegetable or fruit salads and ask for low-calorie salad dressings to be served on the side. Use dressings sparingly. Limit high-fat toppings, such as bacon, crumbled eggs, cheese, sunflower seeds, and olives. Ask for heart-healthy tub margarine instead of butter.

## 2012-04-17 NOTE — Assessment & Plan Note (Signed)
RESOLVED & LIKELY DUE TO UNCONTROLLED REFLUX.  LOW FAT/DIABETIC DIET GERD HO RECOMMENDATION PROVIDED. PROTNIX BID. OPV IN 6 MOS. CALL IF SX RETURN.

## 2012-04-17 NOTE — Progress Notes (Signed)
Faxed to PCP

## 2012-04-24 NOTE — Progress Notes (Signed)
[facility] APR 2013: NM STRESS EF 20%, GLOBAL HYPOKINESIS, NO ISCHEMIA; CT CHEST W/O IVC: ?PNA

## 2012-08-26 ENCOUNTER — Encounter: Payer: Self-pay | Admitting: *Deleted

## 2012-09-23 ENCOUNTER — Encounter: Payer: Self-pay | Admitting: Gastroenterology

## 2012-09-23 ENCOUNTER — Ambulatory Visit (INDEPENDENT_AMBULATORY_CARE_PROVIDER_SITE_OTHER): Payer: Medicare Other | Admitting: Gastroenterology

## 2012-09-23 VITALS — BP 125/80 | HR 80 | Temp 98.4°F | Ht 76.0 in | Wt 235.4 lb

## 2012-09-23 DIAGNOSIS — K219 Gastro-esophageal reflux disease without esophagitis: Secondary | ICD-10-CM | POA: Insufficient documentation

## 2012-09-23 DIAGNOSIS — D126 Benign neoplasm of colon, unspecified: Secondary | ICD-10-CM

## 2012-09-23 DIAGNOSIS — K5900 Constipation, unspecified: Secondary | ICD-10-CM

## 2012-09-23 MED ORDER — LUBIPROSTONE 8 MCG PO CAPS: 8.0000 ug | ORAL_CAPSULE | Freq: Two times a day (BID) | ORAL | Status: DC

## 2012-09-23 NOTE — Assessment & Plan Note (Signed)
Stable on PPI BID. ?

## 2012-09-23 NOTE — Patient Instructions (Addendum)
I have provided samples of a constipation medication called Amitiza.   Stop taking the laxative for now. Start taking Amitiza 1 capsule TWICE A DAY WITH FOOD. Make sure it is with food so you don't get nauseated. If this works for you, that is great. I have already sent a prescription to the pharmacy.   We will see you back in 6 months or sooner if needed.

## 2012-09-23 NOTE — Assessment & Plan Note (Signed)
Multiple polyps in October 2012. Surveillance Oct 2015.

## 2012-09-23 NOTE — Progress Notes (Signed)
Faxed to PCP

## 2012-09-23 NOTE — Assessment & Plan Note (Signed)
Chronic. No rectal bleeding. Did well with Linzess 145 but too expensive. Taking daily laxative. Trial Amitiza po BID. Provided samples and prescription. If fails Amitiza, could resubmit for Linzess. 6 month f/u.

## 2012-09-23 NOTE — Progress Notes (Signed)
Referring Provider: Elfredia Nevins, MD Primary Care Physician:  Cassell Smiles., MD Primary Gastroenterologist: Dr. Darrick Penna   Chief Complaint  Patient presents with  . Follow-up    HPI:   62 year old male with hx of dysphagia, GERD, constipation. Next TCS due in 2015 due to multiple adenomatous polyps. Linzess 145 mcg prescribed for constipation. States Linzess was too expensive, taking a laxative. Takes once a day. Working well for him. No rectal bleeding. GERD controlled on PPI BID. No dysphagia. Weight is stable. No concerns today.   Past Medical History  Diagnosis Date  . UGI bleed 02/12/11    on anticoagulation; EGD w/snare polypectomy-multiple polypoid lesions antrum(benign), Chronic active gastritis (NEGATIVE H pylori)  . Anemia, chronic disease   . Sepsis due to enterococcus 02/11/11  . Atrial fibrillation     coumadin  . CAD (coronary artery disease)     Dr. Alanda Amass  . Cardiomyopathy     40-45% EF 6/12  . End stage kidney disease     T/TH/SAT dialyis, Dr Fausto Skillern  . Diabetes mellitus   . Hypertension   . Hyperlipemia   . Gout   . S/P patent foramen ovale closure     Past Surgical History  Procedure Date  . Cardiac catheterization   . Mitral valve replacement 12/2006    bioprosthetic  . Coronary artery bypass graft   . Colonoscopy 06/23/2011    Dr. Darrick Penna: multiple sessile and pedunculated polyps, moderate diverticulosis, internal hemorrhoids, +ADENOMATOUS POLYPS, consider genetic testing, surveillance in October 2015   . Esophagogastroduodenoscopy 02/12/2011    CHRONIC ACTIVE GASTRITIS, NO H. pylori    Current Outpatient Prescriptions  Medication Sig Dispense Refill  . allopurinol (ZYLOPRIM) 100 MG tablet Take 100 mg by mouth daily.       Marland Kitchen amiodarone (PACERONE) 200 MG tablet Take 200 mg by mouth daily.       Marland Kitchen aspirin 81 MG tablet Take 81 mg by mouth daily.        Marland Kitchen atorvastatin (LIPITOR) 20 MG tablet Take 20 mg by mouth daily.       . B  Complex-C-Zn-Folic Acid (DIALYVITE/ZINC PO) Take by mouth.        . folic acid (FOLVITE) 1 MG tablet Take 1 mg by mouth daily.        Marland Kitchen glimepiride (AMARYL) 4 MG tablet Take 4 mg by mouth 2 (two) times daily.        Marland Kitchen LANTUS SOLOSTAR 100 UNIT/ML injection Inject 38 Units into the skin at bedtime.       . metoprolol (LOPRESSOR) 50 MG tablet Take 50 mg by mouth daily. Take 1.5 tablets daily       . pantoprazole (PROTONIX) 40 MG tablet Take 40 mg by mouth 2 (two) times daily.       Marland Kitchen RENVELA 800 MG tablet Take 800 mg by mouth 3 (three) times daily with meals.       . torsemide (DEMADEX) 20 MG tablet Take 60 mg by mouth daily.       Marland Kitchen warfarin (COUMADIN) 2.5 MG tablet Take 2.5 mg by mouth daily.      Marland Kitchen lubiprostone (AMITIZA) 8 MCG capsule Take 1 capsule (8 mcg total) by mouth 2 (two) times daily with a meal.  60 capsule  3    Allergies as of 09/23/2012  . (No Known Allergies)    Family History  Problem Relation Age of Onset  . Diabetes Mother   . Diabetes Father   . Diabetes Sister  History   Social History  . Marital Status: Married    Spouse Name: N/A    Number of Children: 1  . Years of Education: N/A   Occupational History  . retired    Social History Main Topics  . Smoking status: Former Smoker -- 1.0 packs/day    Types: Cigarettes    Quit date: 05/25/2006  . Smokeless tobacco: None     Comment: quit about 5 yrs  . Alcohol Use: No     Comment: former user  . Drug Use: No  . Sexually Active: None   Other Topics Concern  . None   Social History Narrative  . None    Review of Systems: Negative unless mentioned in HPI  Physical Exam: BP 125/80  Pulse 80  Temp 98.4 F (36.9 C) (Oral)  Ht 6\' 4"  (1.93 m)  Wt 235 lb 6.4 oz (106.777 kg)  BMI 28.65 kg/m2 General:   Alert and oriented. No distress noted. Pleasant and cooperative.  Head:  Normocephalic and atraumatic. Eyes:  Conjuctiva clear without scleral icterus. Mouth:  Oral mucosa pink and moist. Good  dentition. No lesions. Neck:  Supple, without mass or thyromegaly. Heart:  S1, S2 present without murmurs, rubs, or gallops. Regular rate and rhythm. Abdomen:  +BS, soft, non-tender and non-distended. No rebound or guarding. No HSM or masses noted. Msk:  Symmetrical without gross deformities. Normal posture. Extremities:  Trace edema bilaterally Neurologic:  Alert and  oriented x4;  grossly normal neurologically. Skin:  Intact without significant lesions or rashes. Cervical Nodes:  No significant cervical adenopathy. Psych:  Alert and cooperative. Normal mood and affect.

## 2012-12-13 ENCOUNTER — Encounter: Payer: Self-pay | Admitting: Pharmacist Clinician (PhC)/ Clinical Pharmacy Specialist

## 2012-12-13 DIAGNOSIS — Z7901 Long term (current) use of anticoagulants: Secondary | ICD-10-CM | POA: Insufficient documentation

## 2012-12-13 DIAGNOSIS — I4891 Unspecified atrial fibrillation: Secondary | ICD-10-CM | POA: Insufficient documentation

## 2012-12-24 NOTE — Progress Notes (Signed)
REVIEWED.  

## 2013-01-08 ENCOUNTER — Ambulatory Visit: Payer: Medicare Other

## 2013-01-10 ENCOUNTER — Ambulatory Visit (INDEPENDENT_AMBULATORY_CARE_PROVIDER_SITE_OTHER): Payer: Medicare Other | Admitting: Pharmacist Clinician (PhC)/ Clinical Pharmacy Specialist

## 2013-01-10 DIAGNOSIS — I4891 Unspecified atrial fibrillation: Secondary | ICD-10-CM

## 2013-01-10 DIAGNOSIS — Z7901 Long term (current) use of anticoagulants: Secondary | ICD-10-CM

## 2013-02-07 ENCOUNTER — Other Ambulatory Visit: Payer: Self-pay | Admitting: *Deleted

## 2013-02-07 MED ORDER — ATORVASTATIN CALCIUM 20 MG PO TABS: 20.0000 mg | ORAL_TABLET | Freq: Every day | ORAL | Status: DC

## 2013-02-13 ENCOUNTER — Encounter: Payer: Self-pay | Admitting: Cardiovascular Disease

## 2013-02-14 ENCOUNTER — Other Ambulatory Visit: Payer: Self-pay | Admitting: Pharmacist Clinician (PhC)/ Clinical Pharmacy Specialist

## 2013-02-14 MED ORDER — WARFARIN SODIUM 2.5 MG PO TABS: 2.5000 mg | ORAL_TABLET | Freq: Every day | ORAL | Status: DC

## 2013-05-14 ENCOUNTER — Ambulatory Visit: Payer: Medicare Other | Admitting: Gastroenterology

## 2013-05-15 ENCOUNTER — Ambulatory Visit: Payer: Medicare Other | Admitting: Gastroenterology

## 2013-06-02 ENCOUNTER — Encounter: Payer: Self-pay | Admitting: Gastroenterology

## 2013-06-02 ENCOUNTER — Ambulatory Visit (INDEPENDENT_AMBULATORY_CARE_PROVIDER_SITE_OTHER): Payer: Medicare Other | Admitting: Gastroenterology

## 2013-06-02 VITALS — BP 124/76 | HR 103 | Temp 97.9°F | Ht 76.0 in | Wt 236.4 lb

## 2013-06-02 DIAGNOSIS — K5900 Constipation, unspecified: Secondary | ICD-10-CM

## 2013-06-02 NOTE — Patient Instructions (Addendum)
We have provided samples of Amitiza. This is for constipation. Take 1 capsule with food each night for 3 days. If this works well for you, increase to twice a day. Please call us with an update in about 2 weeks, and we can send in the prescription if it works well.   Dr. Darrick Penna will see you again in 6 months.

## 2013-06-02 NOTE — Progress Notes (Signed)
Referring Provider: Elfredia Nevins, MD Primary Care Physician:  Cassell Smiles., MD Primary GI: Dr. Darrick Penna   Chief Complaint  Patient presents with  . Follow-up    HPI:   62 year old pleasant male returns today in follow-up with a history of dysphagia, GERD, constipation. Next TCS due in October 2015 due to history of multiple adenomatous polyps. Originally prescribed Linzess but due to cost switched to Amitiza po BID in Jan 2014. Here for routine follow-up.   States Amitiza didn't help much. Taking Miralax every other day. BM every day to skipping days. No rectal bleeding. No abdominal pain. No nausea, vomiting. No dysphagia. Wt is stable.     Past Medical History  Diagnosis Date  . UGI bleed 02/12/11    on anticoagulation; EGD w/snare polypectomy-multiple polypoid lesions antrum(benign), Chronic active gastritis (NEGATIVE H pylori)  . Anemia, chronic disease   . Sepsis due to enterococcus 02/11/11  . Atrial fibrillation     coumadin  . CAD (coronary artery disease)     Dr. Alanda Amass  . Cardiomyopathy     40-45% EF 6/12  . End stage kidney disease     T/TH/SAT dialyis, Dr Fausto Skillern  . Diabetes mellitus   . Hypertension   . Hyperlipemia   . Gout   . S/P patent foramen ovale closure     Past Surgical History  Procedure Laterality Date  . Cardiac catheterization    . Mitral valve replacement  12/2006    bioprosthetic  . Coronary artery bypass graft    . Colonoscopy  06/23/2011    Dr. Darrick Penna: multiple sessile and pedunculated polyps, moderate diverticulosis, internal hemorrhoids, +ADENOMATOUS POLYPS, consider genetic testing, surveillance in October 2015   . Esophagogastroduodenoscopy  02/12/2011    CHRONIC ACTIVE GASTRITIS, NO H. pylori    Current Outpatient Prescriptions  Medication Sig Dispense Refill  . allopurinol (ZYLOPRIM) 100 MG tablet Take 100 mg by mouth daily.       Marland Kitchen amiodarone (PACERONE) 200 MG tablet Take 200 mg by mouth daily.       Marland Kitchen aspirin 81 MG  tablet Take 81 mg by mouth daily.        Marland Kitchen atorvastatin (LIPITOR) 20 MG tablet Take 1 tablet (20 mg total) by mouth daily.  30 tablet  11  . B Complex-C-Zn-Folic Acid (DIALYVITE/ZINC PO) Take by mouth.        . folic acid (FOLVITE) 1 MG tablet Take 1 mg by mouth daily.        Marland Kitchen glimepiride (AMARYL) 4 MG tablet Take 4 mg by mouth 2 (two) times daily.        Marland Kitchen LANTUS SOLOSTAR 100 UNIT/ML injection Inject 38 Units into the skin at bedtime.       Marland Kitchen lubiprostone (AMITIZA) 8 MCG capsule Take 1 capsule (8 mcg total) by mouth 2 (two) times daily with a meal.  60 capsule  3  . metoprolol (LOPRESSOR) 50 MG tablet Take 50 mg by mouth daily. Take 1.5 tablets daily       . pantoprazole (PROTONIX) 40 MG tablet Take 40 mg by mouth 2 (two) times daily.       Marland Kitchen RENVELA 800 MG tablet Take 800 mg by mouth 3 (three) times daily with meals.       . torsemide (DEMADEX) 20 MG tablet Take 60 mg by mouth daily.       Marland Kitchen warfarin (COUMADIN) 2.5 MG tablet Take 1 tablet (2.5 mg total) by mouth daily.  30 tablet  4   No current facility-administered medications for this visit.    Allergies as of 06/02/2013  . (No Known Allergies)    Family History  Problem Relation Age of Onset  . Diabetes Mother   . Diabetes Father   . Diabetes Sister   . Colon cancer Neg Hx     History   Social History  . Marital Status: Married    Spouse Name: N/A    Number of Children: 1  . Years of Education: N/A   Occupational History  . retired    Social History Main Topics  . Smoking status: Former Smoker -- 1.00 packs/day    Types: Cigarettes    Quit date: 05/25/2006  . Smokeless tobacco: None     Comment: quit about 5 yrs  . Alcohol Use: No     Comment: former user  . Drug Use: No  . Sexual Activity: None   Other Topics Concern  . None   Social History Narrative  . None    Review of Systems: Gen: Denies fever, chills, anorexia. Denies fatigue, weakness, weight loss.  CV: Denies chest pain, palpitations,  syncope, peripheral edema, and claudication. Resp: Denies dyspnea at rest, cough, wheezing, coughing up blood, and pleurisy. GI: see HPI Derm: Denies rash, itching, dry skin Psych: Denies depression, anxiety, memory loss, confusion. No homicidal or suicidal ideation.  Heme: Denies bruising, bleeding, and enlarged lymph nodes.  Physical Exam: BP 124/76  Pulse 103  Temp(Src) 97.9 F (36.6 C) (Oral)  Ht 6\' 4"  (1.93 m)  Wt 236 lb 6.4 oz (107.23 kg)  BMI 28.79 kg/m2 General:   Alert and oriented. No distress noted. Pleasant and cooperative.  Head:  Normocephalic and atraumatic. Eyes:  Conjuctiva clear without scleral icterus. Mouth:  Oral mucosa pink and moist. Good dentition. No lesions. Heart:  S1, S2 present irregularly irregular Abdomen:  +BS, soft, non-tender and non-distended. No rebound or guarding. No HSM or masses noted. Msk:  Symmetrical without gross deformities. Normal posture. Extremities:  Without edema. Neurologic:  Alert and  oriented x4;  grossly normal neurologically. Skin:  Intact without significant lesions or rashes. Psych:  Alert and cooperative. Normal mood and affect.

## 2013-06-03 NOTE — Assessment & Plan Note (Signed)
Still taking Miralax despite prescription for Amitiza BID. Need to trial 24 mcg BID. May need to pursue PA for Linzess if no improvement with higher dose of Amitiza. Next surveillance TCS in October 2015. Return to see Dr. Darrick Penna in 6 months.

## 2013-06-04 NOTE — Progress Notes (Signed)
CC'd to PCP 

## 2013-06-09 ENCOUNTER — Telehealth: Payer: Self-pay | Admitting: *Deleted

## 2013-06-09 MED ORDER — LUBIPROSTONE 8 MCG PO CAPS: 8.0000 ug | ORAL_CAPSULE | Freq: Two times a day (BID) | ORAL | Status: DC

## 2013-06-09 NOTE — Telephone Encounter (Signed)
I called pt and he said the Amitiza is working well. He would like prescription sent to the pharmacy and he will see if he can afford it.

## 2013-06-09 NOTE — Telephone Encounter (Signed)
Done

## 2013-06-09 NOTE — Telephone Encounter (Signed)
Pt called stating the sample RX Valinda Hoar is working.

## 2013-07-02 ENCOUNTER — Encounter: Payer: Self-pay | Admitting: *Deleted

## 2013-07-07 ENCOUNTER — Emergency Department (HOSPITAL_COMMUNITY): Payer: Medicare Other

## 2013-07-07 ENCOUNTER — Encounter (HOSPITAL_COMMUNITY): Payer: Self-pay | Admitting: Emergency Medicine

## 2013-07-07 ENCOUNTER — Inpatient Hospital Stay (HOSPITAL_COMMUNITY)
Admission: EM | Admit: 2013-07-07 | Discharge: 2013-07-18 | DRG: 853 | Disposition: A | Discharge status: Home Health | Payer: Medicare Other | Attending: Internal Medicine | Admitting: Internal Medicine

## 2013-07-07 ENCOUNTER — Ambulatory Visit: Payer: Medicare Other | Admitting: Internal Medicine

## 2013-07-07 DIAGNOSIS — Z952 Presence of prosthetic heart valve: Secondary | ICD-10-CM

## 2013-07-07 DIAGNOSIS — E861 Hypovolemia: Secondary | ICD-10-CM | POA: Diagnosis present

## 2013-07-07 DIAGNOSIS — I428 Other cardiomyopathies: Secondary | ICD-10-CM | POA: Diagnosis present

## 2013-07-07 DIAGNOSIS — Z833 Family history of diabetes mellitus: Secondary | ICD-10-CM

## 2013-07-07 DIAGNOSIS — M86179 Other acute osteomyelitis, unspecified ankle and foot: Secondary | ICD-10-CM | POA: Diagnosis present

## 2013-07-07 DIAGNOSIS — D638 Anemia in other chronic diseases classified elsewhere: Secondary | ICD-10-CM | POA: Diagnosis present

## 2013-07-07 DIAGNOSIS — M86171 Other acute osteomyelitis, right ankle and foot: Secondary | ICD-10-CM | POA: Diagnosis present

## 2013-07-07 DIAGNOSIS — Z951 Presence of aortocoronary bypass graft: Secondary | ICD-10-CM

## 2013-07-07 DIAGNOSIS — I798 Other disorders of arteries, arterioles and capillaries in diseases classified elsewhere: Secondary | ICD-10-CM | POA: Diagnosis present

## 2013-07-07 DIAGNOSIS — Z992 Dependence on renal dialysis: Secondary | ICD-10-CM

## 2013-07-07 DIAGNOSIS — E1122 Type 2 diabetes mellitus with diabetic chronic kidney disease: Secondary | ICD-10-CM | POA: Diagnosis present

## 2013-07-07 DIAGNOSIS — M109 Gout, unspecified: Secondary | ICD-10-CM | POA: Diagnosis present

## 2013-07-07 DIAGNOSIS — Z7901 Long term (current) use of anticoagulants: Secondary | ICD-10-CM

## 2013-07-07 DIAGNOSIS — M9080 Osteopathy in diseases classified elsewhere, unspecified site: Secondary | ICD-10-CM | POA: Diagnosis present

## 2013-07-07 DIAGNOSIS — I120 Hypertensive chronic kidney disease with stage 5 chronic kidney disease or end stage renal disease: Secondary | ICD-10-CM | POA: Diagnosis present

## 2013-07-07 DIAGNOSIS — E1169 Type 2 diabetes mellitus with other specified complication: Secondary | ICD-10-CM | POA: Diagnosis present

## 2013-07-07 DIAGNOSIS — L02619 Cutaneous abscess of unspecified foot: Secondary | ICD-10-CM | POA: Diagnosis present

## 2013-07-07 DIAGNOSIS — A419 Sepsis, unspecified organism: Principal | ICD-10-CM | POA: Diagnosis present

## 2013-07-07 DIAGNOSIS — M869 Osteomyelitis, unspecified: Secondary | ICD-10-CM

## 2013-07-07 DIAGNOSIS — E1159 Type 2 diabetes mellitus with other circulatory complications: Secondary | ICD-10-CM | POA: Diagnosis present

## 2013-07-07 DIAGNOSIS — Z1211 Encounter for screening for malignant neoplasm of colon: Secondary | ICD-10-CM

## 2013-07-07 DIAGNOSIS — K219 Gastro-esophageal reflux disease without esophagitis: Secondary | ICD-10-CM | POA: Diagnosis present

## 2013-07-07 DIAGNOSIS — L03039 Cellulitis of unspecified toe: Secondary | ICD-10-CM | POA: Diagnosis present

## 2013-07-07 DIAGNOSIS — E1129 Type 2 diabetes mellitus with other diabetic kidney complication: Secondary | ICD-10-CM | POA: Diagnosis present

## 2013-07-07 DIAGNOSIS — N186 End stage renal disease: Secondary | ICD-10-CM | POA: Diagnosis present

## 2013-07-07 DIAGNOSIS — E785 Hyperlipidemia, unspecified: Secondary | ICD-10-CM | POA: Diagnosis present

## 2013-07-07 DIAGNOSIS — I96 Gangrene, not elsewhere classified: Secondary | ICD-10-CM | POA: Diagnosis present

## 2013-07-07 DIAGNOSIS — I2510 Atherosclerotic heart disease of native coronary artery without angina pectoris: Secondary | ICD-10-CM | POA: Diagnosis present

## 2013-07-07 DIAGNOSIS — I4891 Unspecified atrial fibrillation: Secondary | ICD-10-CM | POA: Diagnosis present

## 2013-07-07 DIAGNOSIS — Z87891 Personal history of nicotine dependence: Secondary | ICD-10-CM

## 2013-07-07 LAB — CBC WITH DIFFERENTIAL/PLATELET
Basophils Absolute: 0 10*3/uL (ref 0.0–0.1)
Eosinophils Absolute: 0 10*3/uL (ref 0.0–0.7)
Eosinophils Relative: 0 % (ref 0–5)
HCT: 30.3 % — ABNORMAL LOW (ref 39.0–52.0)
Hemoglobin: 9.9 g/dL — ABNORMAL LOW (ref 13.0–17.0)
Lymphocytes Relative: 4 % — ABNORMAL LOW (ref 12–46)
Lymphs Abs: 0.6 10*3/uL — ABNORMAL LOW (ref 0.7–4.0)
MCHC: 32.7 g/dL (ref 30.0–36.0)
MCV: 95.6 fL (ref 78.0–100.0)
Monocytes Absolute: 1.3 10*3/uL — ABNORMAL HIGH (ref 0.1–1.0)
Neutro Abs: 15 10*3/uL — ABNORMAL HIGH (ref 1.7–7.7)
RBC: 3.17 MIL/uL — ABNORMAL LOW (ref 4.22–5.81)
WBC: 16.9 10*3/uL — ABNORMAL HIGH (ref 4.0–10.5)

## 2013-07-07 LAB — BASIC METABOLIC PANEL
CO2: 26 mEq/L (ref 19–32)
Calcium: 10.4 mg/dL (ref 8.4–10.5)
Chloride: 90 mEq/L — ABNORMAL LOW (ref 96–112)
Creatinine, Ser: 12.52 mg/dL — ABNORMAL HIGH (ref 0.50–1.35)
GFR calc Af Amer: 4 mL/min — ABNORMAL LOW (ref >90)
GFR calc non Af Amer: 4 mL/min — ABNORMAL LOW (ref >90)
Glucose, Bld: 197 mg/dL — ABNORMAL HIGH (ref 70–99)

## 2013-07-07 LAB — PROTIME-INR
INR: 2.78 — ABNORMAL HIGH (ref 0.00–1.49)
Prothrombin Time: 28.4 seconds — ABNORMAL HIGH (ref 11.6–15.2)

## 2013-07-07 LAB — GLUCOSE, CAPILLARY: Glucose-Capillary: 171 mg/dL — ABNORMAL HIGH (ref 70–99)

## 2013-07-07 MED ORDER — INSULIN GLARGINE 100 UNIT/ML ~~LOC~~ SOLN
34.0000 [IU] | Freq: Every day | SUBCUTANEOUS | Status: DC
  Administered 2013-07-07 – 2013-07-12 (×6): 34 [IU] via SUBCUTANEOUS
  Filled 2013-07-07 (×7): qty 0.34

## 2013-07-07 MED ORDER — ALLOPURINOL 100 MG PO TABS
100.0000 mg | ORAL_TABLET | Freq: Every day | ORAL | Status: DC
  Administered 2013-07-08 – 2013-07-18 (×11): 100 mg via ORAL
  Filled 2013-07-07 (×11): qty 1

## 2013-07-07 MED ORDER — PIPERACILLIN-TAZOBACTAM 3.375 G IVPB 30 MIN
3.3750 g | Freq: Once | INTRAVENOUS | Status: AC
  Administered 2013-07-07: 3.375 g via INTRAVENOUS
  Filled 2013-07-07: qty 50

## 2013-07-07 MED ORDER — VANCOMYCIN HCL IN DEXTROSE 1-5 GM/200ML-% IV SOLN
INTRAVENOUS | Status: AC
  Filled 2013-07-07: qty 200

## 2013-07-07 MED ORDER — MORPHINE SULFATE 2 MG/ML IJ SOLN: 1.0000 mg | INTRAMUSCULAR | Status: DC | PRN

## 2013-07-07 MED ORDER — ASPIRIN 81 MG PO CHEW
81.0000 mg | CHEWABLE_TABLET | Freq: Every day | ORAL | Status: DC
  Administered 2013-07-08 – 2013-07-18 (×11): 81 mg via ORAL
  Filled 2013-07-07 (×11): qty 1

## 2013-07-07 MED ORDER — ONDANSETRON HCL 4 MG/2ML IJ SOLN: 4.0000 mg | Freq: Four times a day (QID) | INTRAMUSCULAR | Status: DC | PRN

## 2013-07-07 MED ORDER — WARFARIN SODIUM 2.5 MG PO TABS
2.5000 mg | ORAL_TABLET | Freq: Once | ORAL | Status: AC
  Administered 2013-07-07: 2.5 mg via ORAL
  Filled 2013-07-07: qty 1

## 2013-07-07 MED ORDER — VANCOMYCIN HCL IN DEXTROSE 1-5 GM/200ML-% IV SOLN
1000.0000 mg | Freq: Once | INTRAVENOUS | Status: AC
Start: 2013-07-07 — End: 2013-07-07
  Administered 2013-07-07: 1000 mg via INTRAVENOUS

## 2013-07-07 MED ORDER — INSULIN ASPART 100 UNIT/ML ~~LOC~~ SOLN
0.0000 [IU] | Freq: Three times a day (TID) | SUBCUTANEOUS | Status: DC
  Administered 2013-07-08: 3 [IU] via SUBCUTANEOUS
  Administered 2013-07-09 (×2): 2 [IU] via SUBCUTANEOUS
  Administered 2013-07-10: 1 [IU] via SUBCUTANEOUS
  Administered 2013-07-10 – 2013-07-11 (×2): 2 [IU] via SUBCUTANEOUS
  Administered 2013-07-12 – 2013-07-13 (×3): 1 [IU] via SUBCUTANEOUS
  Administered 2013-07-14 – 2013-07-15 (×3): 2 [IU] via SUBCUTANEOUS
  Administered 2013-07-16: 1 [IU] via SUBCUTANEOUS
  Administered 2013-07-16 (×2): 2 [IU] via SUBCUTANEOUS
  Administered 2013-07-17: 1 [IU] via SUBCUTANEOUS
  Administered 2013-07-17: 2 [IU] via SUBCUTANEOUS
  Administered 2013-07-18: 3 [IU] via SUBCUTANEOUS
  Administered 2013-07-18: 1 [IU] via SUBCUTANEOUS

## 2013-07-07 MED ORDER — AMIODARONE HCL 200 MG PO TABS
100.0000 mg | ORAL_TABLET | Freq: Two times a day (BID) | ORAL | Status: DC
  Administered 2013-07-07 – 2013-07-18 (×22): 100 mg via ORAL
  Filled 2013-07-07 (×22): qty 1

## 2013-07-07 MED ORDER — PIPERACILLIN-TAZOBACTAM 3.375 G IVPB
INTRAVENOUS | Status: AC
  Filled 2013-07-07: qty 50

## 2013-07-07 MED ORDER — FOLIC ACID 1 MG PO TABS
1.0000 mg | ORAL_TABLET | Freq: Every day | ORAL | Status: DC
  Administered 2013-07-08 – 2013-07-18 (×10): 1 mg via ORAL
  Filled 2013-07-07 (×10): qty 1

## 2013-07-07 MED ORDER — ACETAMINOPHEN 325 MG PO TABS
650.0000 mg | ORAL_TABLET | Freq: Four times a day (QID) | ORAL | Status: DC | PRN
  Administered 2013-07-09 – 2013-07-12 (×2): 650 mg via ORAL
  Filled 2013-07-07 (×2): qty 2

## 2013-07-07 MED ORDER — VANCOMYCIN HCL IN DEXTROSE 1-5 GM/200ML-% IV SOLN
1000.0000 mg | Freq: Once | INTRAVENOUS | Status: AC
  Administered 2013-07-07: 1000 mg via INTRAVENOUS
  Filled 2013-07-07: qty 200

## 2013-07-07 MED ORDER — METOPROLOL TARTRATE 50 MG PO TABS
50.0000 mg | ORAL_TABLET | Freq: Two times a day (BID) | ORAL | Status: DC
  Administered 2013-07-07 – 2013-07-08 (×2): 50 mg via ORAL
  Filled 2013-07-07 (×4): qty 1

## 2013-07-07 MED ORDER — SEVELAMER CARBONATE 800 MG PO TABS
800.0000 mg | ORAL_TABLET | Freq: Three times a day (TID) | ORAL | Status: DC
  Administered 2013-07-08 – 2013-07-18 (×30): 800 mg via ORAL
  Filled 2013-07-07 (×29): qty 1

## 2013-07-07 MED ORDER — SODIUM CHLORIDE 0.9 % IJ SOLN
3.0000 mL | Freq: Two times a day (BID) | INTRAMUSCULAR | Status: DC
  Administered 2013-07-07 – 2013-07-18 (×14): 3 mL via INTRAVENOUS

## 2013-07-07 MED ORDER — ACETAMINOPHEN 650 MG RE SUPP: 650.0000 mg | Freq: Four times a day (QID) | RECTAL | Status: DC | PRN

## 2013-07-07 MED ORDER — OXYCODONE HCL 5 MG PO TABS
5.0000 mg | ORAL_TABLET | ORAL | Status: DC | PRN
  Administered 2013-07-17: 5 mg via ORAL
  Filled 2013-07-07: qty 1

## 2013-07-07 MED ORDER — LUBIPROSTONE 8 MCG PO CAPS
8.0000 ug | ORAL_CAPSULE | Freq: Two times a day (BID) | ORAL | Status: DC
  Administered 2013-07-08 – 2013-07-16 (×15): 8 ug via ORAL
  Filled 2013-07-07 (×29): qty 1

## 2013-07-07 MED ORDER — INSULIN ASPART 100 UNIT/ML ~~LOC~~ SOLN
0.0000 [IU] | Freq: Every day | SUBCUTANEOUS | Status: DC
  Administered 2013-07-15 – 2013-07-17 (×2): 2 [IU] via SUBCUTANEOUS

## 2013-07-07 MED ORDER — PIPERACILLIN-TAZOBACTAM IN DEX 2-0.25 GM/50ML IV SOLN
2.2500 g | Freq: Three times a day (TID) | INTRAVENOUS | Status: DC
  Administered 2013-07-08 – 2013-07-18 (×31): 2.25 g via INTRAVENOUS
  Filled 2013-07-07 (×40): qty 50

## 2013-07-07 MED ORDER — ONDANSETRON HCL 4 MG PO TABS: 4.0000 mg | ORAL_TABLET | Freq: Four times a day (QID) | ORAL | Status: DC | PRN

## 2013-07-07 MED ORDER — ATORVASTATIN CALCIUM 20 MG PO TABS
20.0000 mg | ORAL_TABLET | Freq: Every day | ORAL | Status: DC
  Administered 2013-07-09 – 2013-07-17 (×9): 20 mg via ORAL
  Filled 2013-07-07 (×9): qty 1

## 2013-07-07 MED ORDER — SALINE SPRAY 0.65 % NA SOLN
NASAL | Status: AC
  Filled 2013-07-07: qty 44

## 2013-07-07 MED ORDER — DOCUSATE SODIUM 100 MG PO CAPS
100.0000 mg | ORAL_CAPSULE | Freq: Two times a day (BID) | ORAL | Status: DC
  Administered 2013-07-07 – 2013-07-15 (×7): 100 mg via ORAL
  Filled 2013-07-07 (×14): qty 1

## 2013-07-07 MED ORDER — WARFARIN - PHARMACIST DOSING INPATIENT: Status: DC

## 2013-07-07 MED ORDER — PANTOPRAZOLE SODIUM 40 MG PO TBEC
40.0000 mg | DELAYED_RELEASE_TABLET | Freq: Two times a day (BID) | ORAL | Status: DC
  Administered 2013-07-07 – 2013-07-18 (×22): 40 mg via ORAL
  Filled 2013-07-07 (×23): qty 1

## 2013-07-07 NOTE — Progress Notes (Signed)
ANTICOAGULATION CONSULT NOTE - Initial Consult  Pharmacy Consult for Coumadin Indication: atrial fibrillation  Allergies  Allergen Reactions  . Norvasc [Amlodipine Besylate]     Patient Measurements: Height: 6\' 4"  (193 cm) Weight: 239 lb (108.41 kg) IBW/kg (Calculated) : 86.8  Vital Signs: Temp: 99.4 F (37.4 C) (11/10 2044) Temp src: Oral (11/10 2044) BP: 106/67 mmHg (11/10 2044) Pulse Rate: 121 (11/10 2044)  Labs:  Recent Labs  07/07/13 1833  HGB 9.9*  HCT 30.3*  PLT 203  LABPROT 28.4*  INR 2.78*  CREATININE 12.52*   Estimated Creatinine Clearance: 8 ml/min (by C-G formula based on Cr of 12.52).  Medical History: Past Medical History  Diagnosis Date  . UGI bleed 02/12/11    on anticoagulation; EGD w/snare polypectomy-multiple polypoid lesions antrum(benign), Chronic active gastritis (NEGATIVE H pylori)  . Anemia, chronic disease   . Sepsis due to enterococcus 02/11/11  . Atrial fibrillation     coumadin  . CAD (coronary artery disease)     s/p CABGx4  . Cardiomyopathy     40-45% EF 6/12  . Diabetes mellitus   . Hypertension   . Hyperlipemia   . Gout   . S/P patent foramen ovale closure   . History of nuclear stress test 11/2011    negative myoview  . End stage kidney disease     T/TH/SAT dialyis, Dr Fausto Skillern  . Gout    Medications:  Prescriptions prior to admission  Medication Sig Dispense Refill  . allopurinol (ZYLOPRIM) 100 MG tablet Take 100 mg by mouth daily.       Marland Kitchen amiodarone (PACERONE) 200 MG tablet Take 100 mg by mouth daily.       Marland Kitchen aspirin 81 MG tablet Take 81 mg by mouth daily.        Marland Kitchen atorvastatin (LIPITOR) 20 MG tablet Take 1 tablet (20 mg total) by mouth daily.  30 tablet  11  . B Complex-C-Zn-Folic Acid (DIALYVITE/ZINC PO) Take by mouth 3 (three) times a week.       . folic acid (FOLVITE) 1 MG tablet Take 1 mg by mouth daily.        Marland Kitchen glimepiride (AMARYL) 4 MG tablet Take 4 mg by mouth 2 (two) times daily.        Marland Kitchen LANTUS SOLOSTAR  100 UNIT/ML injection Inject 38 Units into the skin at bedtime.       Marland Kitchen lubiprostone (AMITIZA) 8 MCG capsule Take 1 capsule (8 mcg total) by mouth 2 (two) times daily with a meal.  60 capsule  3  . metoprolol (LOPRESSOR) 50 MG tablet Take 50 mg by mouth 2 (two) times daily.       . Multiple Vitamin (MULTIVITAMIN) capsule Take 1 capsule by mouth daily.      . pantoprazole (PROTONIX) 40 MG tablet Take 40 mg by mouth 2 (two) times daily.       Marland Kitchen RENVELA 800 MG tablet Take 800 mg by mouth 3 (three) times daily with meals.       . torsemide (DEMADEX) 20 MG tablet Take 60 mg by mouth daily.       . traZODone (DESYREL) 50 MG tablet Take 50 mg by mouth at bedtime.      Marland Kitchen warfarin (COUMADIN) 2.5 MG tablet Take 1 tablet (2.5 mg total) by mouth daily.  30 tablet  4    Assessment: 63yo male on chronic Coumadin PTA for h/o afib.  Home dose listed above.  INR is therapeutic on admission.  Goal of Therapy:  INR 2-3 Monitor platelets by anticoagulation protocol: Yes   Plan:  Coumadin 2.5mg  po x 1 dose tonight (home dose) INR daily  Margo Aye, Edwin Cherian A 07/07/2013,8:49 PM

## 2013-07-07 NOTE — H&P (Signed)
[facility] History and Physical  [name] [name] ZDG:[id number] DOB: [birth year] DOA: 07/07/2013   PCP: Cassell Smiles., MD  Specialists: Followed by Dr. Alanda Amass, with cardiology. Followed by Dr. Kristian Covey with nephrology. He gets dialyzed Tuesday, Thursdays, and Saturdays  Chief Complaint: Pain in the right first toe  HPI: [name] [name] is a 62 y.o. male with a past medical history of coronary artery disease, status post CABG, mitral valve replacement with a tissue valve, end-stage renal disease, diabetes, hypertension, atrial fibrillation on Coumadin who was in his usual state of health till to 3 days ago, when he was cutting his toenails and he thinks he may have cut his nail very close on the right first toe. Patient is not a very good historian. He takes a Hinderer time to answer my questions and is not very specific. His wife is with him, but she is also unable to answer many questions. In any case, he stated that after he cut his nail he noticed some pain in that right toe along with swelling and redness. He's noticed some drainage from that toe around the nail area, but unable to specify any further. He denies any nausea, vomiting. Has felt feverish, but did not check his temperature. He has had chills. He's never had this problem before. He denies being on any new medications. Denies being taken off of any medications. Since the swelling was getting worse he went to his primary care physician's office and was referred from there to the emergency department.  Home Medications: Prior to Admission medications   Medication Sig Start Date End Date Taking? Authorizing Provider  allopurinol (ZYLOPRIM) 100 MG tablet Take 100 mg by mouth daily.  05/15/11   Historical Provider, MD  amiodarone (PACERONE) 200 MG tablet Take 100 mg by mouth daily.  04/24/11   Historical Provider, MD  aspirin 81 MG tablet Take 81 mg by mouth daily.      Historical Provider, MD  atorvastatin (LIPITOR) 20 MG tablet Take 1  tablet (20 mg total) by mouth daily. 02/07/13   Governor Rooks, MD  B Complex-C-Zn-Folic Acid (DIALYVITE/ZINC PO) Take by mouth 3 (three) times a week.     Historical Provider, MD  folic acid (FOLVITE) 1 MG tablet Take 1 mg by mouth daily.      Historical Provider, MD  glimepiride (AMARYL) 4 MG tablet Take 4 mg by mouth 2 (two) times daily.      Historical Provider, MD  LANTUS SOLOSTAR 100 UNIT/ML injection Inject 38 Units into the skin at bedtime.  03/06/11   Historical Provider, MD  lubiprostone (AMITIZA) 8 MCG capsule Take 1 capsule (8 mcg total) by mouth 2 (two) times daily with a meal. 06/09/13   Nira Retort, NP  metoprolol (LOPRESSOR) 50 MG tablet Take 50 mg by mouth 2 (two) times daily.  05/04/11   Historical Provider, MD  Multiple Vitamin (MULTIVITAMIN) capsule Take 1 capsule by mouth daily.    Historical Provider, MD  pantoprazole (PROTONIX) 40 MG tablet Take 40 mg by mouth 2 (two) times daily.  05/25/11   Historical Provider, MD  RENVELA 800 MG tablet Take 800 mg by mouth 3 (three) times daily with meals.  05/03/11   Historical Provider, MD  torsemide (DEMADEX) 20 MG tablet Take 60 mg by mouth daily.  05/04/11   Historical Provider, MD  traZODone (DESYREL) 50 MG tablet Take 50 mg by mouth at bedtime.    Historical Provider, MD  warfarin (COUMADIN) 2.5 MG tablet  Take 1 tablet (2.5 mg total) by mouth daily. 02/14/13   Phillips Hay, RPH-CPP    Allergies:  Allergies  Allergen Reactions  . Norvasc [Amlodipine Besylate]     Past Medical History: Past Medical History  Diagnosis Date  . UGI bleed 02/12/11    on anticoagulation; EGD w/snare polypectomy-multiple polypoid lesions antrum(benign), Chronic active gastritis (NEGATIVE H pylori)  . Anemia, chronic disease   . Sepsis due to enterococcus 02/11/11  . Atrial fibrillation     coumadin  . CAD (coronary artery disease)     s/p CABGx4  . Cardiomyopathy     40-45% EF 6/12  . Diabetes mellitus   . Hypertension   . Hyperlipemia   .  Gout   . S/P patent foramen ovale closure   . History of nuclear stress test 11/2011    negative myoview  . End stage kidney disease     T/TH/SAT dialyis, Dr Fausto Skillern  . Gout     Past Surgical History  Procedure Laterality Date  . Cardiac catheterization  12/31/2006  . Mitral valve replacement  01/05/2007    bioprosthetic 31mm Edwards precordial tissue  . Coronary artery bypass graft  01/05/2007    x4; LIMA-LAD, SVG-OM, seq. SVG-PDA, PLA-RCA  . Colonoscopy  06/23/2011    Dr. Darrick Penna: multiple sessile and pedunculated polyps, moderate diverticulosis, internal hemorrhoids, +ADENOMATOUS POLYPS, consider genetic testing, surveillance in October 2015   . Esophagogastroduodenoscopy  02/12/2011    CHRONIC ACTIVE GASTRITIS, NO H. pylori  . Transthoracic echocardiogram  11/2011    EF 25% w/ mod dilatation of LV & mod LVH; LA severely dilated; mod-severe dilation of RV with mod-severe decrease in RV function; mild MR & TR  . Cardioversion  04/19/2007    Dr. Jonette Eva    Social History: Patient lives in [place] with his wife. He quit smoking about 5 years ago. No alcohol use recently. No illicit drug use. Independent with daily activities.  Family History:  Family History  Problem Relation Age of Onset  . Diabetes Mother   . Diabetes Father   . Diabetes Sister   . Colon cancer Neg Hx      Review of Systems - History obtained from the patient General ROS: negative Psychological ROS: negative Ophthalmic ROS: negative ENT ROS: negative Allergy and Immunology ROS: negative Hematological and Lymphatic ROS: negative Endocrine ROS: negative Respiratory ROS: no cough, shortness of breath, or wheezing Cardiovascular ROS: no chest pain or dyspnea on exertion Gastrointestinal ROS: no abdominal pain, change in bowel habits, or black or bloody stools Genito-Urinary ROS: no dysuria, trouble voiding, or hematuria Musculoskeletal ROS: as per hpi Neurological ROS: no TIA or stroke  symptoms Dermatological ROS: redness of toe and foot  Physical Examination  Filed Vitals:   07/07/13 1638 07/07/13 1640 07/07/13 2013 07/07/13 2044  BP:  106/83 109/64 106/67  Pulse:  123 104 121  Temp:  99.2 F (37.3 C) 100 F (37.8 C) 99.4 F (37.4 C)  TempSrc:  Oral  Oral  Resp:  20 18 20   Height: 6\' 4"  (1.93 m)   6\' 4"  (1.93 m)  Weight: 108.41 kg (239 lb)   102.7 kg (226 lb 6.6 oz)  SpO2:  95% 96% 100%    General appearance: alert, cooperative, appears stated age, no distress and moderately obese Head: Normocephalic, without obvious abnormality, atraumatic Eyes: conjunctivae/corneas clear. PERRL, EOM's intact. Throat: lips, mucosa, and tongue normal; teeth and gums normal Neck: no adenopathy, no carotid bruit, no JVD, supple, symmetrical,  trachea midline and thyroid not enlarged, symmetric, no tenderness/mass/nodules Resp: clear to auscultation bilaterally Cardio: regular rate and rhythm, S1, S2 normal, no murmur, click, rub or gallop GI: soft, non-tender; bowel sounds normal; no masses,  no organomegaly Extremities: Right foot is swollen, with some erythema dorsally. Pulses are poorly palpable. His right first toe is swollen, tender to palpation. No active drainage is noted, but some dried exudate is noted. Yellowish crusting is seen. He has good range of motion of her toes, as well as ankle. The area is warm to touch. Pulses: Poorly palpable Skin: Erythema is noted over the dorsum of the right foot. Neurologic: He is alert and oriented. No focal neurological deficits are present.  Laboratory Data: Results for orders placed during the hospital encounter of 07/07/13 (from the past 48 hour(s))  CBC WITH DIFFERENTIAL     Status: Abnormal   Collection Time    07/07/13  6:33 PM      Result Value Range   WBC 16.9 (*) 4.0 - 10.5 K/uL   RBC 3.17 (*) 4.22 - 5.81 MIL/uL   Hemoglobin 9.9 (*) 13.0 - 17.0 g/dL   HCT 78.2 (*) 95.6 - 21.3 %   MCV 95.6  78.0 - 100.0 fL   MCH 31.2   26.0 - 34.0 pg   MCHC 32.7  30.0 - 36.0 g/dL   RDW 08.6  57.8 - 46.9 %   Platelets 203  150 - 400 K/uL   Neutrophils Relative % 89 (*) 43 - 77 %   Neutro Abs 15.0 (*) 1.7 - 7.7 K/uL   Lymphocytes Relative 4 (*) 12 - 46 %   Lymphs Abs 0.6 (*) 0.7 - 4.0 K/uL   Monocytes Relative 7  3 - 12 %   Monocytes Absolute 1.3 (*) 0.1 - 1.0 K/uL   Eosinophils Relative 0  0 - 5 %   Eosinophils Absolute 0.0  0.0 - 0.7 K/uL   Basophils Relative 0  0 - 1 %   Basophils Absolute 0.0  0.0 - 0.1 K/uL  BASIC METABOLIC PANEL     Status: Abnormal   Collection Time    07/07/13  6:33 PM      Result Value Range   Sodium 134 (*) 135 - 145 mEq/L   Potassium 4.3  3.5 - 5.1 mEq/L   Chloride 90 (*) 96 - 112 mEq/L   CO2 26  19 - 32 mEq/L   Glucose, Bld 197 (*) 70 - 99 mg/dL   BUN 58 (*) 6 - 23 mg/dL   Creatinine, Ser 62.95 (*) 0.50 - 1.35 mg/dL   Calcium 28.4  8.4 - 13.2 mg/dL   GFR calc non Af Amer 4 (*) >90 mL/min   GFR calc Af Amer 4 (*) >90 mL/min   Comment: (NOTE)     The eGFR has been calculated using the CKD EPI equation.     This calculation has not been validated in all clinical situations.     eGFR's persistently <90 mL/min signify possible Chronic Kidney     Disease.  PROTIME-INR     Status: Abnormal   Collection Time    07/07/13  6:33 PM      Result Value Range   Prothrombin Time 28.4 (*) 11.6 - 15.2 seconds   INR 2.78 (*) 0.00 - 1.49  LACTIC ACID, PLASMA     Status: None   Collection Time    07/07/13  6:42 PM      Result Value Range  Lactic Acid, Venous 2.0  0.5 - 2.2 mmol/L    Radiology Reports: Dg Toe Great Right  07/07/2013   CLINICAL DATA:  Right great toe pain and swelling.  EXAM: RIGHT GREAT TOE  COMPARISON:  None.  FINDINGS: There is lytic destruction of the distal tuft of the 1st distal phalanx most consistent with osteomyelitis. Joint spaces appear intact.  IMPRESSION: Lytic destruction of distal tuft of 1st distal phalanx most consistent with osteomyelitis.   Electronically  Signed   By: Roque Lias M.D.   On: 07/07/2013 19:36    Electrocardiogram: Pending  Problem List  Principal Problem:   Acute osteomyelitis of toe of right foot Active Problems:   Anemia, chronic disease   Screening for colon cancer   Atrial fibrillation   Kerper term (current) use of anticoagulants   CAD (coronary artery disease)   S/P CABG (coronary artery bypass graft)   H/O mitral valve replacement   ESRD (end stage renal disease) on dialysis   DM type 2 causing ESRD   Assessment: This is a 62 year old, African American male, with a past medical history as stated earlier, who presents with pain and swelling of his first right toe. X-ray suggests osteomyelitis with the destruction of the first distal phalanx. This is most likely due to recent cutting of his toenails. I didn't see any active drainage at this time. He does have a low-grade fever and has leukocytosis.  Plan: #1 osteomyelitis of the right first toe: He will be initiated on vancomycin and Zosyn. Orthopedics will be consulted. Defer further imaging studies to them. Blood cultures have been ordered as well. Pain control will provided.  #2 history of for coronary artery disease, status post CABG: This issue appears to be stable. Continue with his current medications. And monitor him on telemetry.  #3 history of atrial fibrillation on warfarin: Heart rate is reasonably well controlled. We'll check an EKG. Continue with amiodarone and beta blocker. Warfarin per pharmacy. His INR is therapeutic.  #4 history of mitral valve replacement for severe MR with a tissue valve: Stable. Continue to monitor.  #5 history of diabetes mellitus, type II, with renal complications: Continue Lantus. Check HbA1c. Initiate sliding scale coverage.  #6 end-stage renal disease on hemodialysis on Tuesdays, Thursdays, and Saturdays: [facility] consult nephrology for dialysis needs. He has a fistula on his right forearm with good bruit.  #7 normocytic  anemia: Most likely due to chronic disease. Continue to monitor   DVT Prophylaxis: On warfarin Code Status: Full code Family Communication: Discussed with the patient and his wife  Disposition Plan: Admit to telemetry   Further management decisions will depend on results of further testing and patient's response to treatment.  [facility]  [facility] Pager [phone]  If 7PM-7AM, please contact night-coverage [email or web address] Password [facility]  07/07/2013, 8:56 PM

## 2013-07-07 NOTE — ED Notes (Addendum)
Sent from MD office for evaluation of infected rt great toe. Pt is diabetic. Chills. Toe is swollen, dark

## 2013-07-07 NOTE — ED Provider Notes (Signed)
CSN: [id number]     Arrival date & time 07/07/13  1631 History   First MD Initiated Contact with Patient 07/07/13 1809     Chief Complaint  Patient presents with  . Toe Pain    HPI Pt was seen at 1830. Per pt and his spouse, c/o gradual onset and worsening of persistent right great toe erythema and ecchymosis for the past 2 days. Pt cannot recall injury. States he was evaluated by his PMD today and sent to the ED for further evaluation and admission. Denies fevers, no toe pain, no open wounds, no focal motor weakness, no tingling/numbness in extremities.    Past Medical History  Diagnosis Date  . UGI bleed 02/12/11    on anticoagulation; EGD w/snare polypectomy-multiple polypoid lesions antrum(benign), Chronic active gastritis (NEGATIVE H pylori)  . Anemia, chronic disease   . Sepsis due to enterococcus 02/11/11  . Atrial fibrillation     coumadin  . CAD (coronary artery disease)     s/p CABGx4  . Cardiomyopathy     40-45% EF 6/12  . Diabetes mellitus   . Hypertension   . Hyperlipemia   . Gout   . S/P patent foramen ovale closure   . History of nuclear stress test 11/2011    negative myoview  . End stage kidney disease     T/TH/SAT dialyis, Dr Fausto Skillern  . Gout    Past Surgical History  Procedure Laterality Date  . Cardiac catheterization  12/31/2006  . Mitral valve replacement  01/05/2007    bioprosthetic 31mm Edwards precordial tissue  . Coronary artery bypass graft  01/05/2007    x4; LIMA-LAD, SVG-OM, seq. SVG-PDA, PLA-RCA  . Colonoscopy  06/23/2011    Dr. Darrick Penna: multiple sessile and pedunculated polyps, moderate diverticulosis, internal hemorrhoids, +ADENOMATOUS POLYPS, consider genetic testing, surveillance in October 2015   . Esophagogastroduodenoscopy  02/12/2011    CHRONIC ACTIVE GASTRITIS, NO H. pylori  . Transthoracic echocardiogram  11/2011    EF 25% w/ mod dilatation of LV & mod LVH; LA severely dilated; mod-severe dilation of RV with mod-severe decrease in RV  function; mild MR & TR  . Cardioversion  04/19/2007    Dr. Jonette Eva   Family History  Problem Relation Age of Onset  . Diabetes Mother   . Diabetes Father   . Diabetes Sister   . Colon cancer Neg Hx    History  Substance Use Topics  . Smoking status: Former Smoker -- 1.00 packs/day    Types: Cigarettes    Quit date: 05/26/2007  . Smokeless tobacco: Not on file     Comment: quit about 5 yrs  . Alcohol Use: No     Comment: former user    Review of Systems ROS: Statement: All systems negative except as marked or noted in the HPI; Constitutional: Negative for fever and chills. ; ; Eyes: Negative for eye pain, redness and discharge. ; ; ENMT: Negative for ear pain, hoarseness, nasal congestion, sinus pressure and sore throat. ; ; Cardiovascular: Negative for chest pain, palpitations, diaphoresis, dyspnea and peripheral edema. ; ; Respiratory: Negative for cough, wheezing and stridor. ; ; Gastrointestinal: Negative for nausea, vomiting, diarrhea, abdominal pain, blood in stool, hematemesis, jaundice and rectal bleeding. . ; ; Genitourinary: Negative for dysuria, flank pain and hematuria. ; ; Musculoskeletal: Negative for back pain and neck pain. Negative for swelling and trauma.; ; Skin: +toe erythema and ecchymosis. Negative for pruritus, abrasions, blisters and skin lesion.; ; Neuro: Negative for headache, lightheadedness  and neck stiffness. Negative for weakness, altered level of consciousness , altered mental status, extremity weakness, paresthesias, involuntary movement, seizure and syncope.     Allergies  Norvasc  Home Medications   Current Outpatient Rx  Name  Route  Sig  Dispense  Refill  . allopurinol (ZYLOPRIM) 100 MG tablet   Oral   Take 100 mg by mouth daily.          Marland Kitchen amiodarone (PACERONE) 200 MG tablet   Oral   Take 100 mg by mouth daily.          Marland Kitchen aspirin 81 MG tablet   Oral   Take 81 mg by mouth daily.           Marland Kitchen atorvastatin (LIPITOR) 20 MG tablet    Oral   Take 1 tablet (20 mg total) by mouth daily.   30 tablet   11     ERX Atorvastatin 20 mg to [facility]   . B Complex-C-Zn-Folic Acid (DIALYVITE/ZINC PO)   Oral   Take by mouth 3 (three) times a week.          . folic acid (FOLVITE) 1 MG tablet   Oral   Take 1 mg by mouth daily.           Marland Kitchen glimepiride (AMARYL) 4 MG tablet   Oral   Take 4 mg by mouth 2 (two) times daily.           Marland Kitchen LANTUS SOLOSTAR 100 UNIT/ML injection   Subcutaneous   Inject 38 Units into the skin at bedtime.          Marland Kitchen lubiprostone (AMITIZA) 8 MCG capsule   Oral   Take 1 capsule (8 mcg total) by mouth 2 (two) times daily with a meal.   60 capsule   3   . metoprolol (LOPRESSOR) 50 MG tablet   Oral   Take 50 mg by mouth 2 (two) times daily.          . Multiple Vitamin (MULTIVITAMIN) capsule   Oral   Take 1 capsule by mouth daily.         . pantoprazole (PROTONIX) 40 MG tablet   Oral   Take 40 mg by mouth 2 (two) times daily.          Marland Kitchen RENVELA 800 MG tablet   Oral   Take 800 mg by mouth 3 (three) times daily with meals.          . torsemide (DEMADEX) 20 MG tablet   Oral   Take 60 mg by mouth daily.          . traZODone (DESYREL) 50 MG tablet   Oral   Take 50 mg by mouth at bedtime.         Marland Kitchen warfarin (COUMADIN) 2.5 MG tablet   Oral   Take 1 tablet (2.5 mg total) by mouth daily.   30 tablet   4    BP 106/83  Pulse 123  Temp(Src) 99.2 F (37.3 C) (Oral)  Resp 20  Ht 6\' 4"  (1.93 m)  Wt 239 lb (108.41 kg)  BMI 29.10 kg/m2  SpO2 95% Physical Exam 1840: Physical examination:  Nursing notes reviewed; Vital signs and O2 SAT reviewed;  Constitutional: Well developed, Well nourished, Well hydrated, In no acute distress; Head:  Normocephalic, atraumatic; Eyes: EOMI, PERRL, No scleral icterus; ENMT: Mouth and pharynx normal, Mucous membranes moist; Neck: Supple, Full range of motion, No lymphadenopathy; Cardiovascular: Regular rate  and rhythm, No gallop;  Respiratory: Breath sounds clear & equal bilaterally, No wheezes.  Speaking full sentences with ease, Normal respiratory effort/excursion; Chest: Nontender, Movement normal; Abdomen: Soft, Nontender, Nondistended, Normal bowel sounds; Genitourinary: No CVA tenderness; Extremities: Pulses normal, No tenderness, no deformity. +right great toe ecchymosis and edema, no open wounds. +right great toe erythema extends to distal dorsal and plantar right foot. No calf edema or asymmetry.; Neuro: AA&Ox3, Major CN grossly intact.  Speech clear. No gross focal motor or sensory deficits in extremities.; Skin: Color normal, Warm, Dry.   ED Course  Procedures     EKG Interpretation   None       MDM  MDM Reviewed: previous chart, nursing note and vitals Reviewed previous: labs Interpretation: labs and x-ray     Results for orders placed during the hospital encounter of 07/07/13  CBC WITH DIFFERENTIAL      Result Value Range   WBC 16.9 (*) 4.0 - 10.5 K/uL   RBC 3.17 (*) 4.22 - 5.81 MIL/uL   Hemoglobin 9.9 (*) 13.0 - 17.0 g/dL   HCT 16.1 (*) 09.6 - 04.5 %   MCV 95.6  78.0 - 100.0 fL   MCH 31.2  26.0 - 34.0 pg   MCHC 32.7  30.0 - 36.0 g/dL   RDW 40.9  81.1 - 91.4 %   Platelets 203  150 - 400 K/uL   Neutrophils Relative % 89 (*) 43 - 77 %   Neutro Abs 15.0 (*) 1.7 - 7.7 K/uL   Lymphocytes Relative 4 (*) 12 - 46 %   Lymphs Abs 0.6 (*) 0.7 - 4.0 K/uL   Monocytes Relative 7  3 - 12 %   Monocytes Absolute 1.3 (*) 0.1 - 1.0 K/uL   Eosinophils Relative 0  0 - 5 %   Eosinophils Absolute 0.0  0.0 - 0.7 K/uL   Basophils Relative 0  0 - 1 %   Basophils Absolute 0.0  0.0 - 0.1 K/uL  BASIC METABOLIC PANEL      Result Value Range   Sodium 134 (*) 135 - 145 mEq/L   Potassium 4.3  3.5 - 5.1 mEq/L   Chloride 90 (*) 96 - 112 mEq/L   CO2 26  19 - 32 mEq/L   Glucose, Bld 197 (*) 70 - 99 mg/dL   BUN 58 (*) 6 - 23 mg/dL   Creatinine, Ser 78.29 (*) 0.50 - 1.35 mg/dL   Calcium 56.2  8.4 - 13.0 mg/dL   GFR  calc non Af Amer 4 (*) >90 mL/min   GFR calc Af Amer 4 (*) >90 mL/min  PROTIME-INR      Result Value Range   Prothrombin Time 28.4 (*) 11.6 - 15.2 seconds   INR 2.78 (*) 0.00 - 1.49  LACTIC ACID, PLASMA      Result Value Range   Lactic Acid, Venous 2.0  0.5 - 2.2 mmol/L   Dg Toe Great Right 07/07/2013   CLINICAL DATA:  Right great toe pain and swelling.  EXAM: RIGHT GREAT TOE  COMPARISON:  None.  FINDINGS: There is lytic destruction of the distal tuft of the 1st distal phalanx most consistent with osteomyelitis. Joint spaces appear intact.  IMPRESSION: Lytic destruction of distal tuft of 1st distal phalanx most consistent with osteomyelitis.   Electronically Signed   By: Roque Lias M.D.   On: 07/07/2013 19:36    1955:  Will start IV abx for osteomyelitis. Dx and testing d/w pt and family.  Questions  answered.  Verb understanding, agreeable to admit.  T/C to [facility] Dr. Rito Ehrlich, case discussed, including:  HPI, pertinent PM/SHx, VS/PE, dx testing, ED course and treatment:  Agreeable to admit, requests to write temporary orders, obtain tele inpatient bed to team 1.   Laray Anger, DO 07/09/13 Serena Croissant

## 2013-07-07 NOTE — Progress Notes (Signed)
ANTIBIOTIC CONSULT NOTE - INITIAL  Pharmacy Consult for Vancomycin and Zosyn Indication: cellulitis, osteomyelitis  Allergies  Allergen Reactions  . Norvasc [Amlodipine Besylate]    Patient Measurements: Height: 6\' 4"  (193 cm) Weight: 239 lb (108.41 kg) IBW/kg (Calculated) : 86.8  Vital Signs: Temp: 100 F (37.8 C) (11/10 2013) Temp src: Oral (11/10 1640) BP: 109/64 mmHg (11/10 2013) Pulse Rate: 104 (11/10 2013) Intake/Output from previous day:   Intake/Output from this shift:    Labs:  Recent Labs  07/07/13 1833  WBC 16.9*  HGB 9.9*  PLT 203  CREATININE 12.52*   Estimated Creatinine Clearance: 8 ml/min (by C-G formula based on Cr of 12.52). No results found for this basename: VANCOTROUGH, VANCOPEAK, VANCORANDOM, GENTTROUGH, GENTPEAK, GENTRANDOM, TOBRATROUGH, TOBRAPEAK, TOBRARND, AMIKACINPEAK, AMIKACINTROU, AMIKACIN,  in the last 72 hours   Microbiology: No results found for this or any previous visit (from the past 720 hour(s)).  Medical History: Past Medical History  Diagnosis Date  . UGI bleed 02/12/11    on anticoagulation; EGD w/snare polypectomy-multiple polypoid lesions antrum(benign), Chronic active gastritis (NEGATIVE H pylori)  . Anemia, chronic disease   . Sepsis due to enterococcus 02/11/11  . Atrial fibrillation     coumadin  . CAD (coronary artery disease)     s/p CABGx4  . Cardiomyopathy     40-45% EF 6/12  . Diabetes mellitus   . Hypertension   . Hyperlipemia   . Gout   . S/P patent foramen ovale closure   . History of nuclear stress test 11/2011    negative myoview  . End stage kidney disease     T/TH/SAT dialyis, Dr Fausto Skillern  . Gout    Medications:  Scheduled:   Assessment: 62yo morbidly obese male with ESRD requiring dialysis admitted for cellulitis and possible osteomyelitis.  Estimated Creatinine Clearance: 8 ml/min (by C-G formula based on Cr of 12.52).  Goal of Therapy:  Pre-Hemodialysis Vancomycin level goal range =15-25  mcg/ml  Plan:  Vancomycin 2000mg  IV tonight x 1 dose F/U plans for dialysis and further Vancomycin doses Zosyn 3.375gm IV tonight x 1 then 2.25gm IVq8h Monitor labs, renal fxn, and cultures  Valrie Hart A 07/07/2013,8:17 PM

## 2013-07-08 DIAGNOSIS — D638 Anemia in other chronic diseases classified elsewhere: Secondary | ICD-10-CM

## 2013-07-08 DIAGNOSIS — Z992 Dependence on renal dialysis: Secondary | ICD-10-CM

## 2013-07-08 DIAGNOSIS — M86179 Other acute osteomyelitis, unspecified ankle and foot: Secondary | ICD-10-CM

## 2013-07-08 DIAGNOSIS — I4891 Unspecified atrial fibrillation: Secondary | ICD-10-CM | POA: Diagnosis not present

## 2013-07-08 LAB — COMPREHENSIVE METABOLIC PANEL
AST: 19 U/L (ref 0–37)
Albumin: 2.5 g/dL — ABNORMAL LOW (ref 3.5–5.2)
Alkaline Phosphatase: 71 U/L (ref 39–117)
BUN: 65 mg/dL — ABNORMAL HIGH (ref 6–23)
CO2: 26 mEq/L (ref 19–32)
Calcium: 10.1 mg/dL (ref 8.4–10.5)
Chloride: 93 mEq/L — ABNORMAL LOW (ref 96–112)
Creatinine, Ser: 13.39 mg/dL — ABNORMAL HIGH (ref 0.50–1.35)
GFR calc non Af Amer: 3 mL/min — ABNORMAL LOW (ref >90)
Glucose, Bld: 117 mg/dL — ABNORMAL HIGH (ref 70–99)
Total Bilirubin: 1.8 mg/dL — ABNORMAL HIGH (ref 0.3–1.2)

## 2013-07-08 LAB — MRSA PCR SCREENING: MRSA by PCR: NEGATIVE

## 2013-07-08 LAB — PROTIME-INR
INR: 3.27 — ABNORMAL HIGH (ref 0.00–1.49)
Prothrombin Time: 32.1 seconds — ABNORMAL HIGH (ref 11.6–15.2)

## 2013-07-08 LAB — CBC
Hemoglobin: 9.7 g/dL — ABNORMAL LOW (ref 13.0–17.0)
MCH: 31.5 pg (ref 26.0–34.0)
RBC: 3.08 MIL/uL — ABNORMAL LOW (ref 4.22–5.81)
RDW: 14.1 % (ref 11.5–15.5)
WBC: 13.1 10*3/uL — ABNORMAL HIGH (ref 4.0–10.5)

## 2013-07-08 LAB — HEMOGLOBIN A1C
Hgb A1c MFr Bld: 6.6 % — ABNORMAL HIGH (ref <5.7)
Mean Plasma Glucose: 143 mg/dL — ABNORMAL HIGH (ref <117)

## 2013-07-08 LAB — GLUCOSE, CAPILLARY: Glucose-Capillary: 152 mg/dL — ABNORMAL HIGH (ref 70–99)

## 2013-07-08 LAB — URIC ACID: Uric Acid, Serum: 6 mg/dL (ref 4.0–7.8)

## 2013-07-08 MED ORDER — VANCOMYCIN HCL IN DEXTROSE 1-5 GM/200ML-% IV SOLN
1000.0000 mg | INTRAVENOUS | Status: DC
  Administered 2013-07-08 – 2013-07-17 (×5): 1000 mg via INTRAVENOUS
  Filled 2013-07-08 (×6): qty 200

## 2013-07-08 MED ORDER — DILTIAZEM HCL 100 MG IV SOLR
5.0000 mg/h | INTRAVENOUS | Status: DC
  Administered 2013-07-09: 6 mg/h via INTRAVENOUS
  Filled 2013-07-08: qty 100

## 2013-07-08 MED ORDER — PENTAFLUOROPROP-TETRAFLUOROETH EX AERO
1.0000 "application " | INHALATION_SPRAY | CUTANEOUS | Status: DC | PRN
  Filled 2013-07-08: qty 103.5

## 2013-07-08 MED ORDER — ALTEPLASE 2 MG IJ SOLR
2.0000 mg | Freq: Once | INTRAMUSCULAR | Status: AC | PRN
  Filled 2013-07-08: qty 2

## 2013-07-08 MED ORDER — LIDOCAINE HCL (PF) 1 % IJ SOLN: 5.0000 mL | INTRAMUSCULAR | Status: DC | PRN

## 2013-07-08 MED ORDER — DILTIAZEM LOAD VIA INFUSION
10.0000 mg | Freq: Once | INTRAVENOUS | Status: DC
  Filled 2013-07-08: qty 10

## 2013-07-08 MED ORDER — SODIUM CHLORIDE 0.9 % IV SOLN
100.0000 mL | INTRAVENOUS | Status: DC | PRN
  Administered 2013-07-09: 250 mL via INTRAVENOUS

## 2013-07-08 MED ORDER — HEPARIN SODIUM (PORCINE) 1000 UNIT/ML DIALYSIS
1000.0000 [IU] | INTRAMUSCULAR | Status: DC | PRN
  Filled 2013-07-08: qty 1

## 2013-07-08 MED ORDER — SODIUM CHLORIDE 0.9 % IV SOLN: 100.0000 mL | INTRAVENOUS | Status: DC | PRN

## 2013-07-08 MED ORDER — NEPRO/CARBSTEADY PO LIQD: 237.0000 mL | ORAL | Status: DC | PRN

## 2013-07-08 MED ORDER — LIDOCAINE-PRILOCAINE 2.5-2.5 % EX CREA
1.0000 "application " | TOPICAL_CREAM | CUTANEOUS | Status: DC | PRN
  Filled 2013-07-08: qty 5

## 2013-07-08 NOTE — Progress Notes (Addendum)
ANTIBIOTIC CONSULT NOTE  Pharmacy Consult for Vancomycin and Zosyn Indication: cellulitis, osteomyelitis  Allergies  Allergen Reactions  . Norvasc [Amlodipine Besylate]    Patient Measurements: Height: 6\' 4"  (193 cm) Weight: 226 lb 6.6 oz (102.7 kg) IBW/kg (Calculated) : 86.8  Vital Signs: Temp: 97.6 F (36.4 C) (11/11 0611) Temp src: Oral (11/11 0611) BP: 108/72 mmHg (11/11 0611) Pulse Rate: 112 (11/11 0611) Intake/Output from previous day: 11/10 0701 - 11/11 0700 In: 500 [IV Piggyback:500] Out: 3 [Urine:2; Stool:1] Intake/Output from this shift: Total I/O In: 240 [P.O.:240] Out: -   Labs:  Recent Labs  07/07/13 1833 07/08/13 0528  WBC 16.9* 13.1*  HGB 9.9* 9.7*  PLT 203 179  CREATININE 12.52* 13.39*   No results found for this basename: VANCOTROUGH, VANCOPEAK, VANCORANDOM, GENTTROUGH, GENTPEAK, GENTRANDOM, TOBRATROUGH, TOBRAPEAK, TOBRARND, AMIKACINPEAK, AMIKACINTROU, AMIKACIN,  in the last 72 hours   Microbiology: Recent Results (from the past 720 hour(s))  CULTURE, BLOOD (ROUTINE X 2)     Status: None   Collection Time    07/07/13  8:20 PM      Result Value Range Status   Specimen Description LEFT ANTECUBITAL   Final   Special Requests BOTTLES DRAWN AEROBIC AND ANAEROBIC 10CC EACH   Final   Culture NO GROWTH 1 DAY   Final   Report Status PENDING   Incomplete  CULTURE, BLOOD (ROUTINE X 2)     Status: None   Collection Time    07/07/13  8:25 PM      Result Value Range Status   Specimen Description BLOOD LEFT WRIST   Final   Special Requests BOTTLES DRAWN AEROBIC AND ANAEROBIC 8CC EACH   Final   Culture NO GROWTH 1 DAY   Final   Report Status PENDING   Incomplete    Medical History: Past Medical History  Diagnosis Date  . UGI bleed 02/12/11    on anticoagulation; EGD w/snare polypectomy-multiple polypoid lesions antrum(benign), Chronic active gastritis (NEGATIVE H pylori)  . Anemia, chronic disease   . Sepsis due to enterococcus 02/11/11  . Atrial  fibrillation     coumadin  . CAD (coronary artery disease)     s/p CABGx4  . Cardiomyopathy     40-45% EF 6/12  . Diabetes mellitus   . Hypertension   . Hyperlipemia   . Gout   . S/P patent foramen ovale closure   . History of nuclear stress test 11/2011    negative myoview  . End stage kidney disease     T/TH/SAT dialyis, Dr Fausto Skillern  . Gout    Medications:  Scheduled:  . allopurinol  100 mg Oral Daily  . amiodarone  100 mg Oral BID  . aspirin  81 mg Oral Daily  . atorvastatin  20 mg Oral q1800  . docusate sodium  100 mg Oral BID  . folic acid  1 mg Oral Daily  . insulin aspart  0-5 Units Subcutaneous QHS  . insulin aspart  0-9 Units Subcutaneous TID WC  . insulin glargine  34 Units Subcutaneous QHS  . lubiprostone  8 mcg Oral BID WC  . metoprolol  50 mg Oral BID  . pantoprazole  40 mg Oral BID  . piperacillin-tazobactam (ZOSYN)  IV  2.25 g Intravenous Q8H  . sevelamer carbonate  800 mg Oral TID WC  . sodium chloride  3 mL Intravenous Q12H  . Warfarin - Pharmacist Dosing Inpatient   Does not apply Q24H   Assessment: 62yo morbidly obese male with  ESRD requiring dialysis admitted for cellulitis and possible osteomyelitis.  Vancomycin 11/10 >> Zosyn 11/10 >>  Goal of Therapy:  Pre-Hemodialysis Vancomycin level goal range =15-25 mcg/ml  Plan:  Vancomycin 1gm IV every HD. Zosyn 2.25gm IVq8h Monitor labs, renal fxn, and cultures  Mady Gemma 07/08/2013,10:06 AM

## 2013-07-08 NOTE — Care Management Note (Addendum)
    Page 1 of 2   07/18/2013     1:31:05 PM   CARE MANAGEMENT NOTE 07/18/2013  Patient:  MILLIE, SHORB   Account Number:  [id number]  Date Initiated:  07/08/2013  Documentation initiated by:  Sharrie Rothman  Subjective/Objective Assessment:   Pt admitted from home with osteomylitis. Pt lives with his wife and will return home at discharge. Pt has been fairly independent with ADL's. Pt receives dialysis 3 times a week at [facility] in [place].     Action/Plan:   Will continue to follow for discharge planning needs. Pt may need IV AB at discharge.   Anticipated DC Date:  07/10/2013   Anticipated DC Plan:  HOME/SELF CARE      DC Planning Services  CM consult      [facility] Choice  HOME HEALTH  DURABLE MEDICAL EQUIPMENT   Choice offered to / List presented to:  C-1 Patient   DME arranged  [facility]  [facility]      DME agency  [facility]     [facility] arranged  HH-1 RN      [facility] agency  [facility]   Status of service:  Completed, signed off Medicare Important Message given?  YES (If response is "NO", the following Medicare IM given date fields will be blank) Date Medicare IM given:  07/18/2013 Date Additional Medicare IM given:    Discharge Disposition:  HOME W HOME HEALTH SERVICES  Per UR Regulation:    If discussed at [facility] Length of Stay Meetings, dates discussed:   07/15/2013  07/17/2013    Comments:  07/18/13 Rosemary Holms RN BSN CM HH with [facility] set up for wound vac care. Instructed RN to apply wet to dry dressing at discharge and call [facility] to meet pt to apply home wound vac. This wound care is set up for M, W, Fi. Dialysis will be T,TH Sat. IV AB are planned to be given at Dialysis.  07/15/13 1455 Arlyss Queen, RN BSN CM Pt will need wound vac at discharge. Pt chose [facility] for RN and wound vac. Alroy Bailiff of [facility] is aware and will leave forms on chart for Dr. Lovell Sheehan to fill out. Dr. Lovell Sheehan made aware via text that forms are on the chart. Will  continue to follow for discharge planning needs.  07/08/13 1145 Arlyss Queen, RN BSN CM

## 2013-07-08 NOTE — Progress Notes (Signed)
Patient report called to Arline Asp, RN in ICU. Patient transferring from dialysis to ICU.

## 2013-07-08 NOTE — Progress Notes (Signed)
ANTICOAGULATION CONSULT NOTE  Pharmacy Consult for Coumadin Indication: atrial fibrillation  Allergies  Allergen Reactions  . Norvasc [Amlodipine Besylate]     Patient Measurements: Height: 6\' 4"  (193 cm) Weight: 226 lb 6.6 oz (102.7 kg) IBW/kg (Calculated) : 86.8  Vital Signs: Temp: 97.6 F (36.4 C) (11/11 0611) Temp src: Oral (11/11 0611) BP: 108/72 mmHg (11/11 0611) Pulse Rate: 112 (11/11 0611)  Labs:  Recent Labs  07/07/13 1833 07/08/13 0528  HGB 9.9* 9.7*  HCT 30.3* 29.4*  PLT 203 179  LABPROT 28.4* 32.1*  INR 2.78* 3.27*  CREATININE 12.52* 13.39*   Estimated Creatinine Clearance: 6.8 ml/min (by C-G formula based on Cr of 13.39).  Medical History: Past Medical History  Diagnosis Date  . UGI bleed 02/12/11    on anticoagulation; EGD w/snare polypectomy-multiple polypoid lesions antrum(benign), Chronic active gastritis (NEGATIVE H pylori)  . Anemia, chronic disease   . Sepsis due to enterococcus 02/11/11  . Atrial fibrillation     coumadin  . CAD (coronary artery disease)     s/p CABGx4  . Cardiomyopathy     40-45% EF 6/12  . Diabetes mellitus   . Hypertension   . Hyperlipemia   . Gout   . S/P patent foramen ovale closure   . History of nuclear stress test 11/2011    negative myoview  . End stage kidney disease     T/TH/SAT dialyis, Dr Fausto Skillern  . Gout    Medications:  Prescriptions prior to admission  Medication Sig Dispense Refill  . allopurinol (ZYLOPRIM) 100 MG tablet Take 100 mg by mouth daily.       Marland Kitchen amiodarone (PACERONE) 200 MG tablet Take 100 mg by mouth daily.       Marland Kitchen aspirin 81 MG tablet Take 81 mg by mouth daily.        Marland Kitchen atorvastatin (LIPITOR) 20 MG tablet Take 1 tablet (20 mg total) by mouth daily.  30 tablet  11  . B Complex-C-Zn-Folic Acid (DIALYVITE/ZINC PO) Take by mouth 3 (three) times a week.       . folic acid (FOLVITE) 1 MG tablet Take 1 mg by mouth daily.        Marland Kitchen glimepiride (AMARYL) 4 MG tablet Take 4 mg by mouth 2 (two)  times daily.        Marland Kitchen LANTUS SOLOSTAR 100 UNIT/ML injection Inject 38 Units into the skin at bedtime.       Marland Kitchen lubiprostone (AMITIZA) 8 MCG capsule Take 1 capsule (8 mcg total) by mouth 2 (two) times daily with a meal.  60 capsule  3  . metoprolol (LOPRESSOR) 50 MG tablet Take 50 mg by mouth 2 (two) times daily.       . Multiple Vitamin (MULTIVITAMIN) capsule Take 1 capsule by mouth daily.      . pantoprazole (PROTONIX) 40 MG tablet Take 40 mg by mouth 2 (two) times daily.       Marland Kitchen RENVELA 800 MG tablet Take 800 mg by mouth 3 (three) times daily with meals.       . torsemide (DEMADEX) 20 MG tablet Take 20 mg by mouth 2 (two) times daily.      . traZODone (DESYREL) 50 MG tablet Take 50 mg by mouth at bedtime.      Marland Kitchen warfarin (COUMADIN) 2.5 MG tablet Take 1.25 mg by mouth daily.        Assessment: 62yo male on chronic Coumadin PTA for h/o afib.  INR now above goal.  Home dose clarified as 1.25mg  daily.    Goal of Therapy:  INR 2-3   Plan:  No warfarin today. INR daily  Mady Gemma 07/08/2013,9:58 AM

## 2013-07-08 NOTE — Consult Note (Signed)
Reason for Consult: End-stage renal disease Referring Physician: Dr. Doroteo Bradford [name] is an 62 y.o. male.  HPI: He is her patient was history of coronary artery disease status post CABG ,history of mitral valve replacement, diabetes and end-stage renal disease on maintenance hemodialysis presently came with complaints of swelling , pain of his right big toe. According to the patient he noticed swelling, bluish discoloration and pain with respect to on Friday. Patient denies any trauma or injury to the low. He states that possibly he might have cut his main too short last week. Patient denies any fever but has some chills. When he looked at it yesterday he was some drainage hence he went to his primary care physician when he was sent to the emergency room. Presently is admitted for osteomyelitis of his toe.  Past Medical History  Diagnosis Date  . UGI bleed 02/12/11    on anticoagulation; EGD w/snare polypectomy-multiple polypoid lesions antrum(benign), Chronic active gastritis (NEGATIVE H pylori)  . Anemia, chronic disease   . Sepsis due to enterococcus 02/11/11  . Atrial fibrillation     coumadin  . CAD (coronary artery disease)     s/p CABGx4  . Cardiomyopathy     40-45% EF 6/12  . Diabetes mellitus   . Hypertension   . Hyperlipemia   . Gout   . S/P patent foramen ovale closure   . History of nuclear stress test 11/2011    negative myoview  . End stage kidney disease     T/TH/SAT dialyis, Dr Fausto Skillern  . Gout     Past Surgical History  Procedure Laterality Date  . Cardiac catheterization  12/31/2006  . Mitral valve replacement  01/05/2007    bioprosthetic 31mm Edwards precordial tissue  . Coronary artery bypass graft  01/05/2007    x4; LIMA-LAD, SVG-OM, seq. SVG-PDA, PLA-RCA  . Colonoscopy  06/23/2011    Dr. Darrick Penna: multiple sessile and pedunculated polyps, moderate diverticulosis, internal hemorrhoids, +ADENOMATOUS POLYPS, consider genetic testing, surveillance in October 2015    . Esophagogastroduodenoscopy  02/12/2011    CHRONIC ACTIVE GASTRITIS, NO H. pylori  . Transthoracic echocardiogram  11/2011    EF 25% w/ mod dilatation of LV & mod LVH; LA severely dilated; mod-severe dilation of RV with mod-severe decrease in RV function; mild MR & TR  . Cardioversion  04/19/2007    Dr. Jonette Eva    Family History  Problem Relation Age of Onset  . Diabetes Mother   . Diabetes Father   . Diabetes Sister   . Colon cancer Neg Hx     Social History:  reports that he quit smoking about 6 years ago. His smoking use included Cigarettes. He smoked 1.00 pack per day. He does not have any smokeless tobacco history on file. He reports that he does not drink alcohol or use illicit drugs.  Allergies:  Allergies  Allergen Reactions  . Norvasc [Amlodipine Besylate]     Medications: I have reviewed the patient's current medications.  Results for orders placed during the hospital encounter of 07/07/13 (from the past 48 hour(s))  CBC WITH DIFFERENTIAL     Status: Abnormal   Collection Time    07/07/13  6:33 PM      Result Value Range   WBC 16.9 (*) 4.0 - 10.5 K/uL   RBC 3.17 (*) 4.22 - 5.81 MIL/uL   Hemoglobin 9.9 (*) 13.0 - 17.0 g/dL   HCT 16.1 (*) 09.6 - 04.5 %   MCV 95.6  78.0 - 100.0 fL   MCH 31.2  26.0 - 34.0 pg   MCHC 32.7  30.0 - 36.0 g/dL   RDW 78.4  69.6 - 29.5 %   Platelets 203  150 - 400 K/uL   Neutrophils Relative % 89 (*) 43 - 77 %   Neutro Abs 15.0 (*) 1.7 - 7.7 K/uL   Lymphocytes Relative 4 (*) 12 - 46 %   Lymphs Abs 0.6 (*) 0.7 - 4.0 K/uL   Monocytes Relative 7  3 - 12 %   Monocytes Absolute 1.3 (*) 0.1 - 1.0 K/uL   Eosinophils Relative 0  0 - 5 %   Eosinophils Absolute 0.0  0.0 - 0.7 K/uL   Basophils Relative 0  0 - 1 %   Basophils Absolute 0.0  0.0 - 0.1 K/uL  BASIC METABOLIC PANEL     Status: Abnormal   Collection Time    07/07/13  6:33 PM      Result Value Range   Sodium 134 (*) 135 - 145 mEq/L   Potassium 4.3  3.5 - 5.1 mEq/L   Chloride  90 (*) 96 - 112 mEq/L   CO2 26  19 - 32 mEq/L   Glucose, Bld 197 (*) 70 - 99 mg/dL   BUN 58 (*) 6 - 23 mg/dL   Creatinine, Ser 28.41 (*) 0.50 - 1.35 mg/dL   Calcium 32.4  8.4 - 40.1 mg/dL   GFR calc non Af Amer 4 (*) >90 mL/min   GFR calc Af Amer 4 (*) >90 mL/min   Comment: (NOTE)     The eGFR has been calculated using the CKD EPI equation.     This calculation has not been validated in all clinical situations.     eGFR's persistently <90 mL/min signify possible Chronic Kidney     Disease.  PROTIME-INR     Status: Abnormal   Collection Time    07/07/13  6:33 PM      Result Value Range   Prothrombin Time 28.4 (*) 11.6 - 15.2 seconds   INR 2.78 (*) 0.00 - 1.49  LACTIC ACID, PLASMA     Status: None   Collection Time    07/07/13  6:42 PM      Result Value Range   Lactic Acid, Venous 2.0  0.5 - 2.2 mmol/L  GLUCOSE, CAPILLARY     Status: Abnormal   Collection Time    07/07/13 10:10 PM      Result Value Range   Glucose-Capillary 171 (*) 70 - 99 mg/dL   Comment 1 Notify RN    URIC ACID     Status: None   Collection Time    07/08/13  5:28 AM      Result Value Range   Uric Acid, Serum 6.0  4.0 - 7.8 mg/dL  COMPREHENSIVE METABOLIC PANEL     Status: Abnormal   Collection Time    07/08/13  5:28 AM      Result Value Range   Sodium 136  135 - 145 mEq/L   Potassium 4.2  3.5 - 5.1 mEq/L   Chloride 93 (*) 96 - 112 mEq/L   CO2 26  19 - 32 mEq/L   Glucose, Bld 117 (*) 70 - 99 mg/dL   BUN 65 (*) 6 - 23 mg/dL   Creatinine, Ser 02.72 (*) 0.50 - 1.35 mg/dL   Calcium 53.6  8.4 - 64.4 mg/dL   Total Protein 6.7  6.0 - 8.3 g/dL   Albumin 2.5 (*)  3.5 - 5.2 g/dL   AST 19  0 - 37 U/L   ALT 14  0 - 53 U/L   Alkaline Phosphatase 71  39 - 117 U/L   Total Bilirubin 1.8 (*) 0.3 - 1.2 mg/dL   GFR calc non Af Amer 3 (*) >90 mL/min   GFR calc Af Amer 4 (*) >90 mL/min   Comment: (NOTE)     The eGFR has been calculated using the CKD EPI equation.     This calculation has not been validated in all  clinical situations.     eGFR's persistently <90 mL/min signify possible Chronic Kidney     Disease.  CBC     Status: Abnormal   Collection Time    07/08/13  5:28 AM      Result Value Range   WBC 13.1 (*) 4.0 - 10.5 K/uL   RBC 3.08 (*) 4.22 - 5.81 MIL/uL   Hemoglobin 9.7 (*) 13.0 - 17.0 g/dL   HCT 40.9 (*) 81.1 - 91.4 %   MCV 95.5  78.0 - 100.0 fL   MCH 31.5  26.0 - 34.0 pg   MCHC 33.0  30.0 - 36.0 g/dL   RDW 78.2  95.6 - 21.3 %   Platelets 179  150 - 400 K/uL  PROTIME-INR     Status: Abnormal   Collection Time    07/08/13  5:28 AM      Result Value Range   Prothrombin Time 32.1 (*) 11.6 - 15.2 seconds   INR 3.27 (*) 0.00 - 1.49    Dg Toe Great Right  07/07/2013   CLINICAL DATA:  Right great toe pain and swelling.  EXAM: RIGHT GREAT TOE  COMPARISON:  None.  FINDINGS: There is lytic destruction of the distal tuft of the 1st distal phalanx most consistent with osteomyelitis. Joint spaces appear intact.  IMPRESSION: Lytic destruction of distal tuft of 1st distal phalanx most consistent with osteomyelitis.   Electronically Signed   By: Roque Lias M.D.   On: 07/07/2013 19:36    Review of Systems  Constitutional: Positive for chills.  Respiratory: Negative for shortness of breath.   Cardiovascular: Negative for orthopnea, leg swelling and PND.  Gastrointestinal: Negative for nausea, vomiting and abdominal pain.  Musculoskeletal: Negative.        Patient with bluish discoloration of his right big toe. Seems to be swollen and tender. Presently there is no drainage.   Blood pressure 108/72, pulse 112, temperature 97.6 F (36.4 C), temperature source Oral, resp. rate 20, height 6\' 4"  (1.93 m), weight 102.7 kg (226 lb 6.6 oz), SpO2 96.00%. Physical Exam  Constitutional: He is oriented to person, place, and time.  Eyes: No scleral icterus.  Neck: No JVD present.  Cardiovascular: Normal rate.   Respiratory: No respiratory distress. He has no wheezes.  GI: He exhibits no distension.  There is no tenderness.  Musculoskeletal: He exhibits no edema.  Neurological: He is alert and oriented to person, place, and time.    Assessment/Plan: Problem #1 posture mellitus of his right middle toe. Problem #2 end-stage renal disease is status post hemodialysis on Saturday. Patient is due for dialysis today. He does have any nausea vomiting. Problem #2 diabetes Problem #4 history of a trial fibrillation his heart rate is controlled Problem #5 status post atrial valve replacement Problem #6 anemia this secondary to end-stage renal disease is on Epogen as an outpatient. Problem #7 GERD. Problem #8 hypertension. Recently patient has a problem with hypotension especially  on dialysis. Plan: We'll make arrangements for patient to get dialysis today. We'll check his basic metabolic panel, CBC and phosphorus in the morning.  [name] [name] S 07/08/2013, 8:02 AM

## 2013-07-08 NOTE — Procedures (Signed)
   HEMODIALYSIS TREATMENT NOTE:  4 hour heparin-free dialysis completed via right forearm AVF (15g/antegrade).  Goal NOT met.  Ultrafiltration was interrupted for 48 minutes total; first, due to a hypotensive episode with N/V relieved with NS bolus, then for use of bedside commode.  HR 130s during HD; discussed with Dr. Kerry Hough.  Previously held Metoprolol 50mg  was given po per Dr. Benetta Spar request, however HR remained in 130s for remaining 1.5 hours of dialysis.  Per Dr. Kerry Hough, pt to transfer to step down for IV Cardizem.  All blood was reinfused at end of treatment and hemostasis was achieved within 15 minutes.  Above reported to Billy Coast, RN as pt was transferred to ICU-06.  Events were also reported to pt's wife.  Sammie Schermerhorn L. Kiano Terrien, RN, CDN

## 2013-07-08 NOTE — Progress Notes (Signed)
[facility] PROGRESS NOTE  DANTON PALMATEER YNW:[id number] DOB: 1951-02-19 DOA: 07/07/2013 PCP: Cassell Smiles., MD  Assessment/Plan: 1. Osteomyelitis of the right great toe. Continue antibiotics. Surgical consult 2. Atrial fibrillation with rapid ventricular response. Continue amiodarone and beta blocker. He continues to be tachycardic and requires transfer to step down for a Cardizem infusion. He is anticoagulated on Coumadin 3. End-stage renal disease on hemodialysis. Patient underwent dialysis today per nephrology. 4. Type 2 diabetes. Continue Lantus and sliding scale. 5. Normocytic anemia. Likely due to chronic disease.  Code Status: full code Family Communication: no family present, left voicemail for wife asking to call back Disposition Plan: transfer to step down for management of rapid atrial fib   Consultants:  Nephrology  General Surgery  Procedures:  none  Antibiotics:  Vancomycin 11/10  Zosyn 11/10  HPI/Subjective: No new complaints, no shortness of breath or chest pain  Objective: Filed Vitals:   07/08/13 1730  BP: 114/71  Pulse: 136  Temp:   Resp:     Intake/Output Summary (Last 24 hours) at 07/08/13 1746 Last data filed at 07/08/13 0902  Gross per 24 hour  Intake    740 ml  Output      3 ml  Net    737 ml   Filed Weights   07/07/13 1638 07/07/13 2044 07/08/13 1345  Weight: 108.41 kg (239 lb) 102.7 kg (226 lb 6.6 oz) 104.9 kg (231 lb 4.2 oz)    Exam:   General:  NAD  Cardiovascular: S1, s2 irregular  Respiratory: cta b  Abdomen: soft, nt, nd, bs+  Musculoskeletal: no edema b/l   Data Reviewed: Basic Metabolic Panel:  Recent Labs Lab 07/07/13 1833 07/08/13 0528  NA 134* 136  K 4.3 4.2  CL 90* 93*  CO2 26 26  GLUCOSE 197* 117*  BUN 58* 65*  CREATININE 12.52* 13.39*  CALCIUM 10.4 10.1   Liver Function Tests:  Recent Labs Lab 07/08/13 0528  AST 19  ALT 14  ALKPHOS 71  BILITOT 1.8*  PROT 6.7  ALBUMIN 2.5*    No results found for this basename: LIPASE, AMYLASE,  in the last 168 hours No results found for this basename: AMMONIA,  in the last 168 hours CBC:  Recent Labs Lab 07/07/13 1833 07/08/13 0528  WBC 16.9* 13.1*  NEUTROABS 15.0*  --   HGB 9.9* 9.7*  HCT 30.3* 29.4*  MCV 95.6 95.5  PLT 203 179   Cardiac Enzymes: No results found for this basename: CKTOTAL, CKMB, CKMBINDEX, TROPONINI,  in the last 168 hours BNP (last 3 results) No results found for this basename: PROBNP,  in the last 8760 hours CBG:  Recent Labs Lab 07/07/13 2210 07/08/13 0802 07/08/13 1123  GLUCAP 171* 95 205*    Recent Results (from the past 240 hour(s))  CULTURE, BLOOD (ROUTINE X 2)     Status: None   Collection Time    07/07/13  8:20 PM      Result Value Range Status   Specimen Description LEFT ANTECUBITAL   Final   Special Requests BOTTLES DRAWN AEROBIC AND ANAEROBIC 10CC EACH   Final   Culture NO GROWTH 1 DAY   Final   Report Status PENDING   Incomplete  CULTURE, BLOOD (ROUTINE X 2)     Status: None   Collection Time    07/07/13  8:25 PM      Result Value Range Status   Specimen Description BLOOD LEFT WRIST   Final   Special Requests  BOTTLES DRAWN AEROBIC AND ANAEROBIC 8CC EACH   Final   Culture NO GROWTH 1 DAY   Final   Report Status PENDING   Incomplete     Studies: Dg Toe Great Right  07/07/2013   CLINICAL DATA:  Right great toe pain and swelling.  EXAM: RIGHT GREAT TOE  COMPARISON:  None.  FINDINGS: There is lytic destruction of the distal tuft of the 1st distal phalanx most consistent with osteomyelitis. Joint spaces appear intact.  IMPRESSION: Lytic destruction of distal tuft of 1st distal phalanx most consistent with osteomyelitis.   Electronically Signed   By: Roque Lias M.D.   On: 07/07/2013 19:36    Scheduled Meds: . allopurinol  100 mg Oral Daily  . amiodarone  100 mg Oral BID  . aspirin  81 mg Oral Daily  . atorvastatin  20 mg Oral q1800  . diltiazem  10 mg Intravenous  Once  . docusate sodium  100 mg Oral BID  . folic acid  1 mg Oral Daily  . insulin aspart  0-5 Units Subcutaneous QHS  . insulin aspart  0-9 Units Subcutaneous TID WC  . insulin glargine  34 Units Subcutaneous QHS  . lubiprostone  8 mcg Oral BID WC  . metoprolol  50 mg Oral BID  . pantoprazole  40 mg Oral BID  . piperacillin-tazobactam (ZOSYN)  IV  2.25 g Intravenous Q8H  . sevelamer carbonate  800 mg Oral TID WC  . sodium chloride  3 mL Intravenous Q12H  . vancomycin  1,000 mg Intravenous Q T,Th,Sa-HD  . Warfarin - Pharmacist Dosing Inpatient   Does not apply Q24H   Continuous Infusions: . diltiazem (CARDIZEM) infusion      Principal Problem:   Acute osteomyelitis of toe of right foot Active Problems:   Anemia, chronic disease   Screening for colon cancer   Atrial fibrillation   Tanzi term (current) use of anticoagulants   CAD (coronary artery disease)   S/P CABG (coronary artery bypass graft)   H/O mitral valve replacement   ESRD (end stage renal disease) on dialysis   DM type 2 causing ESRD   Atrial fibrillation with RVR    Time spent:    Travell Desaulniers  [facility] Pager [phone]. If 7PM-7AM, please contact night-coverage at [email or web address], password [facility] 07/08/2013, 5:46 PM  LOS: 1 day

## 2013-07-08 NOTE — Progress Notes (Signed)
UR chart review completed.  

## 2013-07-09 ENCOUNTER — Inpatient Hospital Stay (HOSPITAL_COMMUNITY): Payer: Medicare Other

## 2013-07-09 LAB — GLUCOSE, CAPILLARY
Glucose-Capillary: 76 mg/dL (ref 70–99)
Glucose-Capillary: 151 mg/dL — ABNORMAL HIGH (ref 70–99)
Glucose-Capillary: 177 mg/dL — ABNORMAL HIGH (ref 70–99)
Glucose-Capillary: 170 mg/dL — ABNORMAL HIGH (ref 70–99)

## 2013-07-09 LAB — PHOSPHORUS: Phosphorus: 5.1 mg/dL — ABNORMAL HIGH (ref 2.3–4.6)

## 2013-07-09 LAB — BASIC METABOLIC PANEL
BUN: 31 mg/dL — ABNORMAL HIGH (ref 6–23)
CO2: 32 mEq/L (ref 19–32)
Calcium: 10.2 mg/dL (ref 8.4–10.5)
Chloride: 96 mEq/L (ref 96–112)
Creatinine, Ser: 8 mg/dL — ABNORMAL HIGH (ref 0.50–1.35)
Glucose, Bld: 100 mg/dL — ABNORMAL HIGH (ref 70–99)
Sodium: 139 mEq/L (ref 135–145)

## 2013-07-09 LAB — CBC
HCT: 30.1 % — ABNORMAL LOW (ref 39.0–52.0)
Hemoglobin: 9.9 g/dL — ABNORMAL LOW (ref 13.0–17.0)
MCH: 31.6 pg (ref 26.0–34.0)
MCV: 96.2 fL (ref 78.0–100.0)
RBC: 3.13 MIL/uL — ABNORMAL LOW (ref 4.22–5.81)
WBC: 12.4 10*3/uL — ABNORMAL HIGH (ref 4.0–10.5)

## 2013-07-09 MED ORDER — SODIUM CHLORIDE 0.9 % IV BOLUS (SEPSIS)
250.0000 mL | Freq: Once | INTRAVENOUS | Status: AC
  Administered 2013-07-09: 250 mL via INTRAVENOUS

## 2013-07-09 MED ORDER — DILTIAZEM HCL 100 MG IV SOLR
5.0000 mg/h | INTRAVENOUS | Status: DC
  Administered 2013-07-09: 15 mg/h via INTRAVENOUS
  Administered 2013-07-10: 10 mg/h via INTRAVENOUS
  Administered 2013-07-10: 6 mg/h via INTRAVENOUS
  Administered 2013-07-11: 12 mg/h via INTRAVENOUS
  Filled 2013-07-09: qty 100

## 2013-07-09 MED ORDER — METOPROLOL TARTRATE 25 MG PO TABS
25.0000 mg | ORAL_TABLET | Freq: Two times a day (BID) | ORAL | Status: DC
  Administered 2013-07-09: 25 mg via ORAL
  Filled 2013-07-09 (×2): qty 1

## 2013-07-09 NOTE — Consult Note (Signed)
Reason for Consult: Osteomyelitis, right great toe Referring Physician: [facility]  [name] [name] is an 62 y.o. male.  HPI: Patient is a 62 year old black male with chronic renal failure, end-stage renal disease, and diabetes mellitus who presents with swelling of the right great toe. States he clipped his nails in may of benefit to close to the nail bed. He currently is in the ICU due to rapid atrial fibrillation during dialysis. Plain films of the right foot reveal osteomyelitis and bone destruction of the distal tuft.  Past Medical History  Diagnosis Date  . UGI bleed 02/12/11    on anticoagulation; EGD w/snare polypectomy-multiple polypoid lesions antrum(benign), Chronic active gastritis (NEGATIVE H pylori)  . Anemia, chronic disease   . Sepsis due to enterococcus 02/11/11  . Atrial fibrillation     coumadin  . CAD (coronary artery disease)     s/p CABGx4  . Cardiomyopathy     40-45% EF 6/12  . Diabetes mellitus   . Hypertension   . Hyperlipemia   . Gout   . S/P patent foramen ovale closure   . History of nuclear stress test 11/2011    negative myoview  . End stage kidney disease     T/TH/SAT dialyis, Dr Fausto Skillern  . Gout     Past Surgical History  Procedure Laterality Date  . Cardiac catheterization  12/31/2006  . Mitral valve replacement  01/05/2007    bioprosthetic 31mm Edwards precordial tissue  . Coronary artery bypass graft  01/05/2007    x4; LIMA-LAD, SVG-OM, seq. SVG-PDA, PLA-RCA  . Colonoscopy  06/23/2011    Dr. Darrick Penna: multiple sessile and pedunculated polyps, moderate diverticulosis, internal hemorrhoids, +ADENOMATOUS POLYPS, consider genetic testing, surveillance in October 2015   . Esophagogastroduodenoscopy  02/12/2011    CHRONIC ACTIVE GASTRITIS, NO H. pylori  . Transthoracic echocardiogram  11/2011    EF 25% w/ mod dilatation of LV & mod LVH; LA severely dilated; mod-severe dilation of RV with mod-severe decrease in RV function; mild MR & TR  .  Cardioversion  04/19/2007    Dr. Jonette Eva    Family History  Problem Relation Age of Onset  . Diabetes Mother   . Diabetes Father   . Diabetes Sister   . Colon cancer Neg Hx     Social History:  reports that he quit smoking about 6 years ago. His smoking use included Cigarettes. He smoked 1.00 pack per day. He does not have any smokeless tobacco history on file. He reports that he does not drink alcohol or use illicit drugs.  Allergies:  Allergies  Allergen Reactions  . Norvasc [Amlodipine Besylate]     Medications: I have reviewed the patient's current medications.  Results for orders placed during the hospital encounter of 07/07/13 (from the past 48 hour(s))  HEPATITIS B SURFACE ANTIGEN     Status: None   Collection Time    07/07/13  6:32 PM      Result Value Range   Hepatitis B Surface Ag NEGATIVE  NEGATIVE   Comment: Performed at [facility]  CBC WITH DIFFERENTIAL     Status: Abnormal   Collection Time    07/07/13  6:33 PM      Result Value Range   WBC 16.9 (*) 4.0 - 10.5 K/uL   RBC 3.17 (*) 4.22 - 5.81 MIL/uL   Hemoglobin 9.9 (*) 13.0 - 17.0 g/dL   HCT 16.1 (*) 09.6 - 04.5 %   MCV 95.6  78.0 - 100.0 fL  MCH 31.2  26.0 - 34.0 pg   MCHC 32.7  30.0 - 36.0 g/dL   RDW 96.0  45.4 - 09.8 %   Platelets 203  150 - 400 K/uL   Neutrophils Relative % 89 (*) 43 - 77 %   Neutro Abs 15.0 (*) 1.7 - 7.7 K/uL   Lymphocytes Relative 4 (*) 12 - 46 %   Lymphs Abs 0.6 (*) 0.7 - 4.0 K/uL   Monocytes Relative 7  3 - 12 %   Monocytes Absolute 1.3 (*) 0.1 - 1.0 K/uL   Eosinophils Relative 0  0 - 5 %   Eosinophils Absolute 0.0  0.0 - 0.7 K/uL   Basophils Relative 0  0 - 1 %   Basophils Absolute 0.0  0.0 - 0.1 K/uL  BASIC METABOLIC PANEL     Status: Abnormal   Collection Time    07/07/13  6:33 PM      Result Value Range   Sodium 134 (*) 135 - 145 mEq/L   Potassium 4.3  3.5 - 5.1 mEq/L   Chloride 90 (*) 96 - 112 mEq/L   CO2 26  19 - 32 mEq/L   Glucose, Bld 197 (*) 70  - 99 mg/dL   BUN 58 (*) 6 - 23 mg/dL   Creatinine, Ser 11.91 (*) 0.50 - 1.35 mg/dL   Calcium 47.8  8.4 - 29.5 mg/dL   GFR calc non Af Amer 4 (*) >90 mL/min   GFR calc Af Amer 4 (*) >90 mL/min   Comment: (NOTE)     The eGFR has been calculated using the CKD EPI equation.     This calculation has not been validated in all clinical situations.     eGFR's persistently <90 mL/min signify possible Chronic Kidney     Disease.  PROTIME-INR     Status: Abnormal   Collection Time    07/07/13  6:33 PM      Result Value Range   Prothrombin Time 28.4 (*) 11.6 - 15.2 seconds   INR 2.78 (*) 0.00 - 1.49  LACTIC ACID, PLASMA     Status: None   Collection Time    07/07/13  6:42 PM      Result Value Range   Lactic Acid, Venous 2.0  0.5 - 2.2 mmol/L  CULTURE, BLOOD (ROUTINE X 2)     Status: None   Collection Time    07/07/13  8:20 PM      Result Value Range   Specimen Description LEFT ANTECUBITAL     Special Requests BOTTLES DRAWN AEROBIC AND ANAEROBIC 10CC EACH     Culture NO GROWTH 1 DAY     Report Status PENDING    CULTURE, BLOOD (ROUTINE X 2)     Status: None   Collection Time    07/07/13  8:25 PM      Result Value Range   Specimen Description BLOOD LEFT WRIST     Special Requests BOTTLES DRAWN AEROBIC AND ANAEROBIC 8CC EACH     Culture NO GROWTH 1 DAY     Report Status PENDING    GLUCOSE, CAPILLARY     Status: Abnormal   Collection Time    07/07/13 10:10 PM      Result Value Range   Glucose-Capillary 171 (*) 70 - 99 mg/dL   Comment 1 Notify RN    URIC ACID     Status: None   Collection Time    07/08/13  5:28 AM  Result Value Range   Uric Acid, Serum 6.0  4.0 - 7.8 mg/dL  COMPREHENSIVE METABOLIC PANEL     Status: Abnormal   Collection Time    07/08/13  5:28 AM      Result Value Range   Sodium 136  135 - 145 mEq/L   Potassium 4.2  3.5 - 5.1 mEq/L   Chloride 93 (*) 96 - 112 mEq/L   CO2 26  19 - 32 mEq/L   Glucose, Bld 117 (*) 70 - 99 mg/dL   BUN 65 (*) 6 - 23 mg/dL    Creatinine, Ser 16.10 (*) 0.50 - 1.35 mg/dL   Calcium 96.0  8.4 - 45.4 mg/dL   Total Protein 6.7  6.0 - 8.3 g/dL   Albumin 2.5 (*) 3.5 - 5.2 g/dL   AST 19  0 - 37 U/L   ALT 14  0 - 53 U/L   Alkaline Phosphatase 71  39 - 117 U/L   Total Bilirubin 1.8 (*) 0.3 - 1.2 mg/dL   GFR calc non Af Amer 3 (*) >90 mL/min   GFR calc Af Amer 4 (*) >90 mL/min   Comment: (NOTE)     The eGFR has been calculated using the CKD EPI equation.     This calculation has not been validated in all clinical situations.     eGFR's persistently <90 mL/min signify possible Chronic Kidney     Disease.  CBC     Status: Abnormal   Collection Time    07/08/13  5:28 AM      Result Value Range   WBC 13.1 (*) 4.0 - 10.5 K/uL   RBC 3.08 (*) 4.22 - 5.81 MIL/uL   Hemoglobin 9.7 (*) 13.0 - 17.0 g/dL   HCT 09.8 (*) 11.9 - 14.7 %   MCV 95.5  78.0 - 100.0 fL   MCH 31.5  26.0 - 34.0 pg   MCHC 33.0  30.0 - 36.0 g/dL   RDW 82.9  56.2 - 13.0 %   Platelets 179  150 - 400 K/uL  HEMOGLOBIN A1C     Status: Abnormal   Collection Time    07/08/13  5:28 AM      Result Value Range   Hemoglobin A1C 6.6 (*) <5.7 %   Comment: (NOTE)                                                                               According to the ADA Clinical Practice Recommendations for 2011, when     HbA1c is used as a screening test:      >=6.5%   Diagnostic of Diabetes Mellitus               (if abnormal result is confirmed)     5.7-6.4%   Increased risk of developing Diabetes Mellitus     References:Diagnosis and Classification of Diabetes Mellitus,Diabetes     Care,2011,34(Suppl 1):S62-S69 and Standards of Medical Care in             Diabetes - 2011,Diabetes Care,2011,34 (Suppl 1):S11-S61.   Mean Plasma Glucose 143 (*) <117 mg/dL   Comment: Performed at [facility]  PROTIME-INR     Status:  Abnormal   Collection Time    07/08/13  5:28 AM      Result Value Range   Prothrombin Time 32.1 (*) 11.6 - 15.2 seconds   INR 3.27 (*) 0.00 -  1.49  GLUCOSE, CAPILLARY     Status: None   Collection Time    07/08/13  8:02 AM      Result Value Range   Glucose-Capillary 95  70 - 99 mg/dL   Comment 1 Notify RN    GLUCOSE, CAPILLARY     Status: Abnormal   Collection Time    07/08/13 11:23 AM      Result Value Range   Glucose-Capillary 205 (*) 70 - 99 mg/dL   Comment 1 Notify RN    MRSA PCR SCREENING     Status: None   Collection Time    07/08/13  7:00 PM      Result Value Range   MRSA by PCR NEGATIVE  NEGATIVE   Comment:            The GeneXpert MRSA Assay (FDA     approved for NASAL specimens     only), is one component of a     comprehensive MRSA colonization     surveillance program. It is not     intended to diagnose MRSA     infection nor to guide or     monitor treatment for     MRSA infections.  GLUCOSE, CAPILLARY     Status: Abnormal   Collection Time    07/08/13  9:21 PM      Result Value Range   Glucose-Capillary 152 (*) 70 - 99 mg/dL   Comment 1 Notify RN    PROTIME-INR     Status: Abnormal   Collection Time    07/09/13  5:32 AM      Result Value Range   Prothrombin Time 36.5 (*) 11.6 - 15.2 seconds   INR 3.87 (*) 0.00 - 1.49  CBC     Status: Abnormal   Collection Time    07/09/13  5:32 AM      Result Value Range   WBC 12.4 (*) 4.0 - 10.5 K/uL   RBC 3.13 (*) 4.22 - 5.81 MIL/uL   Hemoglobin 9.9 (*) 13.0 - 17.0 g/dL   HCT 56.2 (*) 13.0 - 86.5 %   MCV 96.2  78.0 - 100.0 fL   MCH 31.6  26.0 - 34.0 pg   MCHC 32.9  30.0 - 36.0 g/dL   RDW 78.4  69.6 - 29.5 %   Platelets 195  150 - 400 K/uL  BASIC METABOLIC PANEL     Status: Abnormal   Collection Time    07/09/13  5:32 AM      Result Value Range   Sodium 139  135 - 145 mEq/L   Potassium 4.3  3.5 - 5.1 mEq/L   Chloride 96  96 - 112 mEq/L   CO2 32  19 - 32 mEq/L   Glucose, Bld 100 (*) 70 - 99 mg/dL   BUN 31 (*) 6 - 23 mg/dL   Comment: DELTA CHECK NOTED   Creatinine, Ser 8.00 (*) 0.50 - 1.35 mg/dL   Calcium 28.4  8.4 - 13.2 mg/dL   GFR calc non  Af Amer 6 (*) >90 mL/min   GFR calc Af Amer 7 (*) >90 mL/min   Comment: (NOTE)     The eGFR has been calculated using the CKD EPI equation.     This  calculation has not been validated in all clinical situations.     eGFR's persistently <90 mL/min signify possible Chronic Kidney     Disease.  PHOSPHORUS     Status: Abnormal   Collection Time    07/09/13  5:32 AM      Result Value Range   Phosphorus 5.1 (*) 2.3 - 4.6 mg/dL    Dg Toe Great Right  07/07/2013   CLINICAL DATA:  Right great toe pain and swelling.  EXAM: RIGHT GREAT TOE  COMPARISON:  None.  FINDINGS: There is lytic destruction of the distal tuft of the 1st distal phalanx most consistent with osteomyelitis. Joint spaces appear intact.  IMPRESSION: Lytic destruction of distal tuft of 1st distal phalanx most consistent with osteomyelitis.   Electronically Signed   By: Roque Lias M.D.   On: 07/07/2013 19:36    ROS: See chart Blood pressure 83/42, pulse 93, temperature 98.4 F (36.9 C), temperature source Oral, resp. rate 24, height 6\' 4"  (1.93 m), weight 104 kg (229 lb 4.5 oz), SpO2 97.00%. Physical Exam: Pleasant black male in no acute distress. Extremity examination reveals a swollen, cyanotic right great toe with superficial skin breakdown laterally. No dorsalis pedis or posterior tibial pulses are identified. The right foot does not appear swollen.  Assessment/Plan: Impression:  Osteomyelitis and cellulitis, right great toe End-stage renal disease, rapid atrial fibrillation, diabetes mellitus Plan: Would continue IV antibiotics for now. We'll get noninvasive arterial studies of lower extremities. Further management is pending those results.  [name] [name] A 07/09/2013, 8:01 AM

## 2013-07-09 NOTE — Progress Notes (Addendum)
[name] [name]  MRN: [id number]  DOB/AGE: [birth year] 62 y.o.  Primary Care Physician:FUSCO,LAWRENCE J., MD  Admit date: 07/07/2013  Chief Complaint:  Chief Complaint  Patient presents with  . Toe Pain    S-Pt presented on  07/07/2013 with  Chief Complaint  Patient presents with  . Toe Pain  .    Pt today feels better  Meds . allopurinol  100 mg Oral Daily  . amiodarone  100 mg Oral BID  . aspirin  81 mg Oral Daily  . atorvastatin  20 mg Oral q1800  . diltiazem  10 mg Intravenous Once  . docusate sodium  100 mg Oral BID  . folic acid  1 mg Oral Daily  . insulin aspart  0-5 Units Subcutaneous QHS  . insulin aspart  0-9 Units Subcutaneous TID WC  . insulin glargine  34 Units Subcutaneous QHS  . lubiprostone  8 mcg Oral BID WC  . metoprolol  50 mg Oral BID  . pantoprazole  40 mg Oral BID  . piperacillin-tazobactam (ZOSYN)  IV  2.25 g Intravenous Q8H  . sevelamer carbonate  800 mg Oral TID WC  . sodium chloride  3 mL Intravenous Q12H  . vancomycin  1,000 mg Intravenous Q T,Th,Sa-HD  . Warfarin - Pharmacist Dosing Inpatient   Does not apply Q24H      Physical Exam: Vital signs in last 24 hours: Temp:  [98.1 F (36.7 C)-100.5 F (38.1 C)] 99.1 F (37.3 C) (11/12 0843) Pulse Rate:  [40-145] 93 (11/12 0700) Resp:  [14-26] 24 (11/12 0700) BP: (76-145)/(32-78) 83/42 mmHg (11/12 0700) SpO2:  [89 %-100 %] 97 % (11/12 0700) Weight:  [229 lb 4.5 oz (104 kg)-231 lb 4.2 oz (104.9 kg)] 229 lb 4.5 oz (104 kg) (11/12 0500) Weight change: -7 lb 11.8 oz (-3.51 kg) Last BM Date: 07/09/13  Intake/Output from previous day: 11/11 0701 - 11/12 0700 In: 715.4 [P.O.:480; I.V.:185.4; IV Piggyback:50] Out: 1713  Total I/O In: 496 [P.O.:480; I.V.:16] Out: -    Physical Exam: General- pt is awake,alert, oriented to time place and person Resp- No acute REsp distress, CTA B/L NO Rhonchi CVS- S1S2 Irregular in rate and rhythm GIT- BS+, soft, NT, ND EXT- NO LE Edema, Right Great Toe  Cyanosis+ Swelling + Small discharge+   Lab Results: CBC  Recent Labs  07/08/13 0528 07/09/13 0532  WBC 13.1* 12.4*  HGB 9.7* 9.9*  HCT 29.4* 30.1*  PLT 179 195    BMET  Recent Labs  07/08/13 0528 07/09/13 0532  NA 136 139  K 4.2 4.3  CL 93* 96  CO2 26 32  GLUCOSE 117* 100*  BUN 65* 31*  CREATININE 13.39* 8.00*  CALCIUM 10.1 10.2    MICRO Recent Results (from the past 240 hour(s))  CULTURE, BLOOD (ROUTINE X 2)     Status: None   Collection Time    07/07/13  8:20 PM      Result Value Range Status   Specimen Description LEFT ANTECUBITAL   Final   Special Requests BOTTLES DRAWN AEROBIC AND ANAEROBIC 10CC EACH   Final   Culture NO GROWTH 1 DAY   Final   Report Status PENDING   Incomplete  CULTURE, BLOOD (ROUTINE X 2)     Status: None   Collection Time    07/07/13  8:25 PM      Result Value Range Status   Specimen Description BLOOD LEFT WRIST   Final   Special Requests BOTTLES DRAWN AEROBIC AND  ANAEROBIC 8CC EACH   Final   Culture NO GROWTH 1 DAY   Final   Report Status PENDING   Incomplete  MRSA PCR SCREENING     Status: None   Collection Time    07/08/13  7:00 PM      Result Value Range Status   MRSA by PCR NEGATIVE  NEGATIVE Final   Comment:            The GeneXpert MRSA Assay (FDA     approved for NASAL specimens     only), is one component of a     comprehensive MRSA colonization     surveillance program. It is not     intended to diagnose MRSA     infection nor to guide or     monitor treatment for     MRSA infections.      Lab Results  Component Value Date   CALCIUM 10.2 07/09/2013   PHOS 5.1* 07/09/2013           Impression: 1)Renal  ESRD on HD                ON TTS schedule                 Pt was dialyzed yesterday                 NO need of HD today  2)HTN  BP on lower side  Target Organ damage  CKD CAD CHF Medication- On Calcium Channel Blockers On Beta blockers    3)Anemia HGb at goal (9--11)   4)CKD  Mineral-Bone Disorder Phosphorus at goal. Calcium at goal.  5)ID-admitted with Osteomyelitis Primary MD following  6)Electrolytes Normokalemic NOrmonatremic   7)Acid base Co2 at goal  8) Afib- On Coumadin    Plan:  Agree with current tx and plan. NO need of Hd today      Dontrail Blackwell S 07/09/2013, 8:56 AM

## 2013-07-09 NOTE — Progress Notes (Signed)
DR MEMON NOTIEFIED OF INCREASED HR AT 1300 AND NOW. ORDERS RECEIVED.

## 2013-07-09 NOTE — Progress Notes (Signed)
[facility] PROGRESS NOTE  [name] [name] ZOX:[id number] DOB: Dec 14, 1950 DOA: 07/07/2013 PCP: Cassell Smiles., MD  Assessment/Plan: 1. Osteomyelitis of the right great toe. Continue antibiotics. Surgical consult appreciated.  Arterial dopplers have been ordered. 2. Sepsis due to #1.  Continue antibiotics.  He does appear to be hypovolemic.  Discussed with nephrology and recommendations have been for small boluses of IVF prn. 3. Atrial fibrillation with rapid ventricular response. Continue amiodarone and beta blocker. Heart rate is better on cardizem infusion.  Will discontinue cardizem and restart oral beta blocker. He is anticoagulated on Coumadin 4. End-stage renal disease on hemodialysis. Patient underwent dialysis yesterday per nephrology. 5. Type 2 diabetes. Continue Lantus and sliding scale. 6. Normocytic anemia. Likely due to chronic disease. stable  Code Status: full code Family Communication: no family present Disposition Plan: pending hospital course   Consultants:  Nephrology  General Surgery  Procedures:  none  Antibiotics:  Vancomycin 11/10  Zosyn 11/10  HPI/Subjective: No new complaints, no shortness of breath or chest pain, feeling better today  Objective: Filed Vitals:   07/09/13 0843  BP:   Pulse:   Temp: 99.1 F (37.3 C)  Resp:     Intake/Output Summary (Last 24 hours) at 07/09/13 1017 Last data filed at 07/09/13 0800  Gross per 24 hour  Intake 971.37 ml  Output   1713 ml  Net -741.63 ml   Filed Weights   07/07/13 2044 07/08/13 1345 07/09/13 0500  Weight: 102.7 kg (226 lb 6.6 oz) 104.9 kg (231 lb 4.2 oz) 104 kg (229 lb 4.5 oz)    Exam:   General:  NAD  Cardiovascular: S1, s2 irregular  Respiratory: cta b  Abdomen: soft, nt, nd, bs+  Musculoskeletal: right great toe is swollen and cyanotic, nontender  Data Reviewed: Basic Metabolic Panel:  Recent Labs Lab 07/07/13 1833 07/08/13 0528 07/09/13 0532  NA 134* 136 139   K 4.3 4.2 4.3  CL 90* 93* 96  CO2 26 26 32  GLUCOSE 197* 117* 100*  BUN 58* 65* 31*  CREATININE 12.52* 13.39* 8.00*  CALCIUM 10.4 10.1 10.2  PHOS  --   --  5.1*   Liver Function Tests:  Recent Labs Lab 07/08/13 0528  AST 19  ALT 14  ALKPHOS 71  BILITOT 1.8*  PROT 6.7  ALBUMIN 2.5*   No results found for this basename: LIPASE, AMYLASE,  in the last 168 hours No results found for this basename: AMMONIA,  in the last 168 hours CBC:  Recent Labs Lab 07/07/13 1833 07/08/13 0528 07/09/13 0532  WBC 16.9* 13.1* 12.4*  NEUTROABS 15.0*  --   --   HGB 9.9* 9.7* 9.9*  HCT 30.3* 29.4* 30.1*  MCV 95.6 95.5 96.2  PLT 203 179 195   Cardiac Enzymes: No results found for this basename: CKTOTAL, CKMB, CKMBINDEX, TROPONINI,  in the last 168 hours BNP (last 3 results) No results found for this basename: PROBNP,  in the last 8760 hours CBG:  Recent Labs Lab 07/07/13 2210 07/08/13 0802 07/08/13 1123 07/08/13 2121 07/09/13 0817  GLUCAP 171* 95 205* 152* 76    Recent Results (from the past 240 hour(s))  CULTURE, BLOOD (ROUTINE X 2)     Status: None   Collection Time    07/07/13  8:20 PM      Result Value Range Status   Specimen Description LEFT ANTECUBITAL   Final   Special Requests BOTTLES DRAWN AEROBIC AND ANAEROBIC 10CC EACH   Final   Culture  NO GROWTH 1 DAY   Final   Report Status PENDING   Incomplete  CULTURE, BLOOD (ROUTINE X 2)     Status: None   Collection Time    07/07/13  8:25 PM      Result Value Range Status   Specimen Description BLOOD LEFT WRIST   Final   Special Requests BOTTLES DRAWN AEROBIC AND ANAEROBIC 8CC EACH   Final   Culture NO GROWTH 1 DAY   Final   Report Status PENDING   Incomplete  MRSA PCR SCREENING     Status: None   Collection Time    07/08/13  7:00 PM      Result Value Range Status   MRSA by PCR NEGATIVE  NEGATIVE Final   Comment:            The GeneXpert MRSA Assay (FDA     approved for NASAL specimens     only), is one component  of a     comprehensive MRSA colonization     surveillance program. It is not     intended to diagnose MRSA     infection nor to guide or     monitor treatment for     MRSA infections.     Studies: Dg Toe Great Right  07/07/2013   CLINICAL DATA:  Right great toe pain and swelling.  EXAM: RIGHT GREAT TOE  COMPARISON:  None.  FINDINGS: There is lytic destruction of the distal tuft of the 1st distal phalanx most consistent with osteomyelitis. Joint spaces appear intact.  IMPRESSION: Lytic destruction of distal tuft of 1st distal phalanx most consistent with osteomyelitis.   Electronically Signed   By: Roque Lias M.D.   On: 07/07/2013 19:36    Scheduled Meds: . allopurinol  100 mg Oral Daily  . amiodarone  100 mg Oral BID  . aspirin  81 mg Oral Daily  . atorvastatin  20 mg Oral q1800  . docusate sodium  100 mg Oral BID  . folic acid  1 mg Oral Daily  . insulin aspart  0-5 Units Subcutaneous QHS  . insulin aspart  0-9 Units Subcutaneous TID WC  . insulin glargine  34 Units Subcutaneous QHS  . lubiprostone  8 mcg Oral BID WC  . metoprolol  50 mg Oral BID  . metoprolol tartrate  25 mg Oral BID  . pantoprazole  40 mg Oral BID  . piperacillin-tazobactam (ZOSYN)  IV  2.25 g Intravenous Q8H  . sevelamer carbonate  800 mg Oral TID WC  . sodium chloride  250 mL Intravenous Once  . sodium chloride  3 mL Intravenous Q12H  . vancomycin  1,000 mg Intravenous Q T,Th,Sa-HD  . Warfarin - Pharmacist Dosing Inpatient   Does not apply Q24H   Continuous Infusions:    Principal Problem:   Acute osteomyelitis of toe of right foot Active Problems:   Anemia, chronic disease   Screening for colon cancer   Atrial fibrillation   Sirmons term (current) use of anticoagulants   CAD (coronary artery disease)   S/P CABG (coronary artery bypass graft)   H/O mitral valve replacement   ESRD (end stage renal disease) on dialysis   DM type 2 causing ESRD   Atrial fibrillation with RVR    Time spent:     [facility]  [facility] Pager [phone]. If 7PM-7AM, please contact night-coverage at [email or web address], password [facility] 07/09/2013, 10:17 AM  LOS: 2 days

## 2013-07-09 NOTE — Progress Notes (Signed)
ANTICOAGULATION CONSULT NOTE  Pharmacy Consult for Coumadin Indication: atrial fibrillation  Allergies  Allergen Reactions  . Norvasc [Amlodipine Besylate]    Patient Measurements: Height: 6\' 4"  (193 cm) Weight: 229 lb 4.5 oz (104 kg) IBW/kg (Calculated) : 86.8  Vital Signs: Temp: 99.1 F (37.3 C) (11/12 0843) Temp src: Oral (11/12 0843) BP: 91/62 mmHg (11/12 1000) Pulse Rate: 89 (11/12 1000)  Labs:  Recent Labs  07/07/13 1833 07/08/13 0528 07/09/13 0532  HGB 9.9* 9.7* 9.9*  HCT 30.3* 29.4* 30.1*  PLT 203 179 195  LABPROT 28.4* 32.1* 36.5*  INR 2.78* 3.27* 3.87*  CREATININE 12.52* 13.39* 8.00*   Estimated Creatinine Clearance: 11.5 ml/min (by C-G formula based on Cr of 8).  Medical History: Past Medical History  Diagnosis Date  . UGI bleed 02/12/11    on anticoagulation; EGD w/snare polypectomy-multiple polypoid lesions antrum(benign), Chronic active gastritis (NEGATIVE H pylori)  . Anemia, chronic disease   . Sepsis due to enterococcus 02/11/11  . Atrial fibrillation     coumadin  . CAD (coronary artery disease)     s/p CABGx4  . Cardiomyopathy     40-45% EF 6/12  . Diabetes mellitus   . Hypertension   . Hyperlipemia   . Gout   . S/P patent foramen ovale closure   . History of nuclear stress test 11/2011    negative myoview  . End stage kidney disease     T/TH/SAT dialyis, Dr Fausto Skillern  . Gout    Medications:  Prescriptions prior to admission  Medication Sig Dispense Refill  . allopurinol (ZYLOPRIM) 100 MG tablet Take 100 mg by mouth daily.       Marland Kitchen amiodarone (PACERONE) 200 MG tablet Take 100 mg by mouth daily.       Marland Kitchen aspirin 81 MG tablet Take 81 mg by mouth daily.        Marland Kitchen atorvastatin (LIPITOR) 20 MG tablet Take 1 tablet (20 mg total) by mouth daily.  30 tablet  11  . B Complex-C-Zn-Folic Acid (DIALYVITE/ZINC PO) Take by mouth 3 (three) times a week.       . folic acid (FOLVITE) 1 MG tablet Take 1 mg by mouth daily.        Marland Kitchen glimepiride (AMARYL) 4  MG tablet Take 4 mg by mouth 2 (two) times daily.        Marland Kitchen LANTUS SOLOSTAR 100 UNIT/ML injection Inject 38 Units into the skin at bedtime.       Marland Kitchen lubiprostone (AMITIZA) 8 MCG capsule Take 1 capsule (8 mcg total) by mouth 2 (two) times daily with a meal.  60 capsule  3  . metoprolol (LOPRESSOR) 50 MG tablet Take 50 mg by mouth 2 (two) times daily.       . Multiple Vitamin (MULTIVITAMIN) capsule Take 1 capsule by mouth daily.      . pantoprazole (PROTONIX) 40 MG tablet Take 40 mg by mouth 2 (two) times daily.       Marland Kitchen RENVELA 800 MG tablet Take 800 mg by mouth 3 (three) times daily with meals.       . torsemide (DEMADEX) 20 MG tablet Take 20 mg by mouth 2 (two) times daily.      . traZODone (DESYREL) 50 MG tablet Take 50 mg by mouth at bedtime.      Marland Kitchen warfarin (COUMADIN) 2.5 MG tablet Take 1.25 mg by mouth daily.       Assessment: 62yo male on chronic Coumadin PTA for h/o afib.  INR now above goal.  Home dose clarified as 1.25mg  daily.    Goal of Therapy:  INR 2-3   Plan:  No warfarin today. (INR is elevated) INR daily  Margo Aye, Aveya Beal A 07/09/2013,10:47 AM

## 2013-07-09 NOTE — Progress Notes (Signed)
UR chart review completed.  

## 2013-07-10 LAB — PHOSPHORUS: Phosphorus: 5.2 mg/dL — ABNORMAL HIGH (ref 2.3–4.6)

## 2013-07-10 LAB — GLUCOSE, CAPILLARY
Glucose-Capillary: 151 mg/dL — ABNORMAL HIGH (ref 70–99)
Glucose-Capillary: 125 mg/dL — ABNORMAL HIGH (ref 70–99)

## 2013-07-10 LAB — CBC
HCT: 28.2 % — ABNORMAL LOW (ref 39.0–52.0)
Hemoglobin: 9.4 g/dL — ABNORMAL LOW (ref 13.0–17.0)
MCH: 32 pg (ref 26.0–34.0)
MCHC: 33.3 g/dL (ref 30.0–36.0)
MCV: 95.9 fL (ref 78.0–100.0)
Platelets: 208 10*3/uL (ref 150–400)
RBC: 2.94 MIL/uL — ABNORMAL LOW (ref 4.22–5.81)
RDW: 14.3 % (ref 11.5–15.5)
WBC: 15.2 10*3/uL — ABNORMAL HIGH (ref 4.0–10.5)

## 2013-07-10 LAB — BASIC METABOLIC PANEL
BUN: 45 mg/dL — ABNORMAL HIGH (ref 6–23)
CO2: 26 mEq/L (ref 19–32)
Calcium: 10.4 mg/dL (ref 8.4–10.5)
Chloride: 95 mEq/L — ABNORMAL LOW (ref 96–112)
Creatinine, Ser: 10.32 mg/dL — ABNORMAL HIGH (ref 0.50–1.35)
GFR calc Af Amer: 5 mL/min — ABNORMAL LOW (ref >90)
GFR calc non Af Amer: 5 mL/min — ABNORMAL LOW (ref >90)
Glucose, Bld: 134 mg/dL — ABNORMAL HIGH (ref 70–99)
Potassium: 4.2 mEq/L (ref 3.5–5.1)
Sodium: 137 mEq/L (ref 135–145)

## 2013-07-10 LAB — PROTIME-INR: Prothrombin Time: 37.5 seconds — ABNORMAL HIGH (ref 11.6–15.2)

## 2013-07-10 MED ORDER — METOPROLOL TARTRATE 50 MG PO TABS
50.0000 mg | ORAL_TABLET | Freq: Two times a day (BID) | ORAL | Status: DC
  Administered 2013-07-10: 50 mg via ORAL
  Filled 2013-07-10: qty 1

## 2013-07-10 NOTE — Progress Notes (Signed)
PT'S DIALYSIS IS COMPLETED. TOLERATED WELL.

## 2013-07-10 NOTE — Progress Notes (Addendum)
Subjective: Interval History: has no complaint of  nausea or vomiting. Patient also denies any chest pain and no difficulty in  breathing..  Objective: Vital signs in last 24 hours: Temp:  [98.6 F (37 C)-103.1 F (39.5 C)] 98.6 F (37 C) (11/13 0400) Pulse Rate:  [27-131] 101 (11/13 0730) Resp:  [12-32] 27 (11/13 0730) BP: (68-135)/(28-103) 101/46 mmHg (11/13 0730) SpO2:  [62 %-100 %] 83 % (11/13 0730) Weight:  [104.6 kg (230 lb 9.6 oz)] 104.6 kg (230 lb 9.6 oz) (11/13 0500) Weight change: -0.3 kg (-10.6 oz)  Intake/Output from previous day: 11/12 0701 - 11/13 0700 In: 1860.3 [P.O.:1040; I.V.:670.3; IV Piggyback:150] Out: -  Intake/Output this shift:    General appearance: alert, cooperative and no distress Resp: clear to auscultation bilaterally Cardio: irregularly irregular rhythm GI: soft, non-tender; bowel sounds normal; no masses,  no organomegaly Extremities: edema Trace to 1+ edema  Lab Results:  Recent Labs  07/09/13 0532 07/10/13 0504  WBC 12.4* 15.2*  HGB 9.9* 9.4*  HCT 30.1* 28.2*  PLT 195 208   BMET:  Recent Labs  07/09/13 0532 07/10/13 0504  NA 139 137  K 4.3 4.2  CL 96 95*  CO2 32 26  GLUCOSE 100* 134*  BUN 31* 45*  CREATININE 8.00* 10.32*  CALCIUM 10.2 10.4   No results found for this basename: PTH,  in the last 72 hours Iron Studies: No results found for this basename: IRON, TIBC, TRANSFERRIN, FERRITIN,  in the last 72 hours  Studies/Results: US Arterial Seg Single  07/09/2013   CLINICAL DATA:  Diabetes and right 1st toe osteomyelitis.  EXAM: NONINVASIVE PHYSIOLOGIC VASCULAR STUDY OF BILATERAL LOWER EXTREMITIES  TECHNIQUE: Evaluation of both lower extremities was performed at rest, including calculation of ankle-brachial indices, multiple segmental pressure evaluation, segmental Doppler and segmental pulse volume recording.  COMPARISON:  None.  FINDINGS: Right ABI:  Not calculable.  Left ABI:  Not calculable.  Right Lower Extremity:  Arteries throughout the right lower extremity are noncompressible and accurate segmental pressure values could not be obtained. An ankle brachial index could therefore not be calculated. Waveform analysis demonstrates essentially biphasic arterial waveforms at the femoral, superficial femoral, popliteal, post should tibial and dorsalis pedis levels. The 1st toe pressure is 127 mm of mercury with a normal toe brachial index of 0.81. Digital waveform show some probable component of digital small vessel disease, especially in the 1st through 3rd toes.  Left Lower Extremity: Similar to the right side, arteries are completely noncompressible throughout the left lower extremity. Accurate ABI could not be calculated. Waveforms are biphasic at the femoral, superficial femoral, popliteal, post should tibial and dorsalis pedis levels. The 1st toe pressure is 90 mm of mercury with calculated toe brachial index of 0.59. Digital waveforms show normal amplitude of the 1st toe and depressed amplitude at the level of the 2nd through 5th toes.  IMPRESSION: Noncompressible arteries throughout both lower extremities, likely related to diabetic arterial calcification. Ankle-brachial indices cannot be calculated. Based on distal waveforms, there likely is a component of digitals arterial disease in both feet. The 1st toe pressure is normal on the right and mildly depressed on the left.   Electronically Signed   By: Irish Lack M.D.   On: 07/09/2013 17:23    I have reviewed the patient's current medications.  Assessment/Plan: Problem #1 end-stage renal disease is status post hemodialysis the day before yesterday. His BUN is 45 creatinine is 10.3 to patient doesn't have any uremic sign and symptoms.  His potassium is normal. Problem #2 diabetes Problem #3 CAD Problem #4 anemia his hemoglobin and hematocrit is lower than acceptable range. Patient is on Epogen. Problem #5 osteomyelitis of his right toe. Patient on antibiotics  he has elevated temperature and white blood cell count. Problem #6 history of peripheral vascular disease Problem #7 metabolic bone disease his calcium and phosphorus was in acceptable range. Problem #8 history of chronic hypotension. Problem #9 history of a trial fibrillation. Plan: Will dialyze patient today           We'll continue with Epogen.           We'll use low temperature dialysate           We'll we'll check his basic metabolic panel and CBC in the morning.   LOS: 3 days   Terrica Duecker S 07/10/2013,7:47 AM

## 2013-07-10 NOTE — Progress Notes (Signed)
ANTICOAGULATION CONSULT NOTE  Pharmacy Consult for Coumadin Indication: atrial fibrillation  Allergies  Allergen Reactions  . Norvasc [Amlodipine Besylate]    Patient Measurements: Height: 6\' 4"  (193 cm) Weight: 230 lb 9.6 oz (104.6 kg) IBW/kg (Calculated) : 86.8  Vital Signs: Temp: 98.3 F (36.8 C) (11/13 0816) Temp src: Oral (11/13 0816) BP: 101/46 mmHg (11/13 0730) Pulse Rate: 101 (11/13 0730)  Labs:  Recent Labs  07/08/13 0528 07/09/13 0532 07/10/13 0504  HGB 9.7* 9.9* 9.4*  HCT 29.4* 30.1* 28.2*  PLT 179 195 208  LABPROT 32.1* 36.5* 37.5*  INR 3.27* 3.87* 4.01*  CREATININE 13.39* 8.00* 10.32*   Estimated Creatinine Clearance: 9.6 ml/min (by C-G formula based on Cr of 10.32).  Medical History: Past Medical History  Diagnosis Date  . UGI bleed 02/12/11    on anticoagulation; EGD w/snare polypectomy-multiple polypoid lesions antrum(benign), Chronic active gastritis (NEGATIVE H pylori)  . Anemia, chronic disease   . Sepsis due to enterococcus 02/11/11  . Atrial fibrillation     coumadin  . CAD (coronary artery disease)     s/p CABGx4  . Cardiomyopathy     40-45% EF 6/12  . Diabetes mellitus   . Hypertension   . Hyperlipemia   . Gout   . S/P patent foramen ovale closure   . History of nuclear stress test 11/2011    negative myoview  . End stage kidney disease     T/TH/SAT dialyis, Dr Fausto Skillern  . Gout    Medications:  Prescriptions prior to admission  Medication Sig Dispense Refill  . allopurinol (ZYLOPRIM) 100 MG tablet Take 100 mg by mouth daily.       Marland Kitchen amiodarone (PACERONE) 200 MG tablet Take 100 mg by mouth daily.       Marland Kitchen aspirin 81 MG tablet Take 81 mg by mouth daily.        Marland Kitchen atorvastatin (LIPITOR) 20 MG tablet Take 1 tablet (20 mg total) by mouth daily.  30 tablet  11  . B Complex-C-Zn-Folic Acid (DIALYVITE/ZINC PO) Take by mouth 3 (three) times a week.       . folic acid (FOLVITE) 1 MG tablet Take 1 mg by mouth daily.        Marland Kitchen glimepiride  (AMARYL) 4 MG tablet Take 4 mg by mouth 2 (two) times daily.        Marland Kitchen LANTUS SOLOSTAR 100 UNIT/ML injection Inject 38 Units into the skin at bedtime.       Marland Kitchen lubiprostone (AMITIZA) 8 MCG capsule Take 1 capsule (8 mcg total) by mouth 2 (two) times daily with a meal.  60 capsule  3  . metoprolol (LOPRESSOR) 50 MG tablet Take 50 mg by mouth 2 (two) times daily.       . Multiple Vitamin (MULTIVITAMIN) capsule Take 1 capsule by mouth daily.      . pantoprazole (PROTONIX) 40 MG tablet Take 40 mg by mouth 2 (two) times daily.       Marland Kitchen RENVELA 800 MG tablet Take 800 mg by mouth 3 (three) times daily with meals.       . torsemide (DEMADEX) 20 MG tablet Take 20 mg by mouth 2 (two) times daily.      . traZODone (DESYREL) 50 MG tablet Take 50 mg by mouth at bedtime.      Marland Kitchen warfarin (COUMADIN) 2.5 MG tablet Take 1.25 mg by mouth daily.       Assessment: 62yo male on chronic Coumadin PTA for h/o afib.  INR remains above goal despite holding warfarin doses.  Home dose clarified as 1.25mg  daily.    Goal of Therapy:  INR 2-3   Plan:  No warfarin today. (INR is elevated) INR daily  [name] [name] 07/10/2013,8:25 AM

## 2013-07-10 NOTE — Progress Notes (Signed)
[facility] PROGRESS NOTE  [name] [name] WGN:[id number] DOB: 04/17/1951 DOA: 07/07/2013 PCP: Cassell Smiles., MD Followed by Dr. Alanda Amass, with cardiology. Followed by Dr. Kristian Covey with nephrology. He gets dialyzed Tuesday, Thursdays, and Saturdays  Assessment/Plan: 1. Osteomyelitis of the right great toe with associated sepsis: Continue antibiotics, surgery consultation appreciated. Bolus IV fluids as needed. Await further recommendations from general surgery. ABIs cannot be calculated but had normal right toe pressure. Anticipate will need surgery, suspect will develop gangrene.  2. Atrial fibrillation with rapid ventricular response: Currently on diltiazem infusion and 6. Stable. Continue amiodarone. Increase beta blocker to home dose. Wean off infusion today. 3. Type 2 diabetes mellitus: Stable. Continue Lantus and sliding scale. 4. Stage renal disease on hemodialysis: Management deferred to nephrology 5. Normocytic anemia: Likely secondary chronic disease. Stable. 6. History of coronary artery disease 7. History of left ventricular ejection fraction 35% by 2-D echo 12/2006. Asymptomatic. 8. Gout   Continue to hold warfarin.will likely need to reverse for surgery.   Increase beta blocker to home dose. Discontinue diltiazem infusion afterwards.  Continue empiric antibiotics. Followup general surgery recommendations.  Pending studies:   Blood cultures  Code Status: full code DVT prophylaxis: warfarin Family Communication: none present Disposition Plan: pending further evaluation  Brendia Sacks, MD  [facility]  Pager [phone] If 7PM-7AM, please contact night-coverage at [email or web address], password [facility] 07/10/2013, 9:53 AM  LOS: 3 days   Summary: 62 year old man with history of end-stage renal disease, diabetes mellitus, mitral valve replacement with tissue valve, status post CABG, atrial fibrillation maintained on warfarin who presented to the emergency department  with a history of having had his nail, developed pain, swelling and redness. X-ray suggested osteomyelitis and patient was admitted for further evaluation. Shortly after hospitalization he developed atrial fibrillation with rapid ventricular response.  Consultants:  Nephrology  General surgery  Procedures:    Antibiotics:  Vancomycin 11/10 >>   Zosyn 11/10 >>   HPI/Subjective: Overall feels fine. No complaints today except some mild first right toe soreness.  Objective: Filed Vitals:   07/10/13 0700 07/10/13 0715 07/10/13 0730 07/10/13 0816  BP: 119/60 135/64 101/46   Pulse: 71 27 101   Temp:    98.3 F (36.8 C)  TempSrc:    Oral  Resp: 21 27 27    Height:      Weight:      SpO2: 91% 83% 83%     Intake/Output Summary (Last 24 hours) at 07/10/13 0953 Last data filed at 07/10/13 0700  Gross per 24 hour  Intake 1708.3 ml  Output      0 ml  Net 1708.3 ml     Filed Weights   07/08/13 1345 07/09/13 0500 07/10/13 0500  Weight: 104.9 kg (231 lb 4.2 oz) 104 kg (229 lb 4.5 oz) 104.6 kg (230 lb 9.6 oz)    Exam:   Afebrile, intermittent hypotension. Heart rate generally controlled.  General: Appears calm and comfortable.  Psychiatric: Grossly normal mood and affect. Speech fluent and appropriate.  Cardiovascular: Irregular. Normal rate. No murmur, rub or gallop.  Telemetry: Atrial fibrillation heart rate 90s-100s.  Respiratory: Clear to auscultation bilaterally. No wheezes, rales or rhonchi. Normal respiratory effort.  Skin: Right foot the first toe is cyanotic, nontender, edematous. The remainder of the foot appears unremarkable and capillary refill is generally unremarkable.  Left foot: Normal capillary refill. Foot appears unremarkable.  Data Reviewed:  Capillary blood sugars stable  Leukocytosis without significant change, 15.2  Hemoglobin stable 9.4  INR elevated  4.0, trending upwards   Blood cultures no growth today  Arterial brachial indices  could not be calculated secondary to diabetic arterial calcification. Digital arterial disease suspected.  Scheduled Meds: . allopurinol  100 mg Oral Daily  . amiodarone  100 mg Oral BID  . aspirin  81 mg Oral Daily  . atorvastatin  20 mg Oral q1800  . docusate sodium  100 mg Oral BID  . folic acid  1 mg Oral Daily  . insulin aspart  0-5 Units Subcutaneous QHS  . insulin aspart  0-9 Units Subcutaneous TID WC  . insulin glargine  34 Units Subcutaneous QHS  . lubiprostone  8 mcg Oral BID WC  . metoprolol tartrate  25 mg Oral BID  . pantoprazole  40 mg Oral BID  . piperacillin-tazobactam (ZOSYN)  IV  2.25 g Intravenous Q8H  . sevelamer carbonate  800 mg Oral TID WC  . sodium chloride  3 mL Intravenous Q12H  . vancomycin  1,000 mg Intravenous Q T,Th,Sa-HD  . Warfarin - Pharmacist Dosing Inpatient   Does not apply Q24H   Continuous Infusions: . diltiazem (CARDIZEM) infusion 6 mg/hr (07/10/13 0700)    Principal Problem:   Acute osteomyelitis of toe of right foot Active Problems:   Anemia, chronic disease   Screening for colon cancer   Atrial fibrillation   [place] term (current) use of anticoagulants   CAD (coronary artery disease)   S/P CABG (coronary artery bypass graft)   H/O mitral valve replacement   ESRD (end stage renal disease) on dialysis   DM type 2 causing ESRD   Atrial fibrillation with RVR   Time spent 25 minutes

## 2013-07-11 ENCOUNTER — Encounter: Payer: Self-pay | Admitting: *Deleted

## 2013-07-11 LAB — BASIC METABOLIC PANEL
BUN: 28 mg/dL — ABNORMAL HIGH (ref 6–23)
CO2: 28 mEq/L (ref 19–32)
Chloride: 96 mEq/L (ref 96–112)
Glucose, Bld: 119 mg/dL — ABNORMAL HIGH (ref 70–99)
Potassium: 4.2 mEq/L (ref 3.5–5.1)

## 2013-07-11 LAB — CBC
HCT: 29.6 % — ABNORMAL LOW (ref 39.0–52.0)
Hemoglobin: 9.8 g/dL — ABNORMAL LOW (ref 13.0–17.0)
MCHC: 33.1 g/dL (ref 30.0–36.0)
Platelets: 230 10*3/uL (ref 150–400)
WBC: 16 10*3/uL — ABNORMAL HIGH (ref 4.0–10.5)

## 2013-07-11 LAB — PROTIME-INR: INR: 3.52 — ABNORMAL HIGH (ref 0.00–1.49)

## 2013-07-11 LAB — GLUCOSE, CAPILLARY
Glucose-Capillary: 200 mg/dL — ABNORMAL HIGH (ref 70–99)
Glucose-Capillary: 167 mg/dL — ABNORMAL HIGH (ref 70–99)

## 2013-07-11 MED ORDER — EPOETIN ALFA 10000 UNIT/ML IJ SOLN
10000.0000 [IU] | INTRAMUSCULAR | Status: DC
  Administered 2013-07-12 – 2013-07-17 (×3): 10000 [IU] via INTRAVENOUS
  Filled 2013-07-11 (×2): qty 1

## 2013-07-11 MED ORDER — METOPROLOL TARTRATE 50 MG PO TABS
100.0000 mg | ORAL_TABLET | Freq: Two times a day (BID) | ORAL | Status: DC
  Administered 2013-07-11 – 2013-07-18 (×14): 100 mg via ORAL
  Filled 2013-07-11 (×14): qty 2

## 2013-07-11 NOTE — Progress Notes (Signed)
[facility] PROGRESS NOTE  [name] [name] QMV:[id number] DOB: 24-Jul-1951 DOA: 07/07/2013 PCP: Cassell Smiles., MD Followed by Dr. Alanda Amass, with cardiology. Followed by Dr. Kristian Covey with nephrology. He gets dialyzed Tuesday, Thursdays, and Saturdays  Assessment/Plan: 1. Osteomyelitis of the right great toe with associated sepsis and developing gangrene: Discussed with Dr. Lovell Sheehan. Will continue antibiotics. Patient will require amputation. We will focus on heart rate control and prepared for surgery in the next few days when optimized. 2. Atrial fibrillation with rapid ventricular response: Borderline control, currently on diltiazem 5 an hour. Will adjust medications. A low INR to drift downwards. Oral vitamin K if needed. 3. Type 2 diabetes mellitus: Stable. Continue Lantus and sliding scale. 4. Stage renal disease on hemodialysis: Management deferred to nephrology 5. Normocytic anemia: Likely secondary chronic disease. Stable. 6. History of coronary artery disease 7. History of left ventricular ejection fraction 35% by 2-D echo 12/2006. Asymptomatic. 8. Gout   Continue amiodarone. Increase metoprolol. Wean off diltiazem today.  Continue antibiotics. Surgery planned in 72 hours.  Pending studies:   Blood cultures no growth thus far  Code Status: full code DVT prophylaxis: INR currently is supratherapeutic. Lovenox when INR less than 2. Family Communication:  Disposition Plan: pending further evaluation  Brendia Sacks, MD  [facility]  Pager [phone] If 7PM-7AM, please contact night-coverage at [email or web address], password [facility] 07/11/2013, 8:05 AM  LOS: 4 days   Summary: 62 year old man with history of end-stage renal disease, diabetes mellitus, mitral valve replacement with tissue valve, status post CABG, atrial fibrillation maintained on warfarin who presented to the emergency department with a history of having had his nail, developed pain, swelling and redness.  X-ray suggested osteomyelitis and patient was admitted for further evaluation. Shortly after hospitalization he developed atrial fibrillation with rapid ventricular response.  Consultants:  Nephrology  General surgery  Procedures:    Antibiotics:  Vancomycin 11/10 >>   Zosyn 11/10 >>   HPI/Subjective: Overall feels better. No new complaints. No issues per nursing.  Objective: Filed Vitals:   07/11/13 0430 07/11/13 0500 07/11/13 0530 07/11/13 0600  BP: 130/86 105/46 116/82 110/65  Pulse: 63 98 99 87  Temp:      TempSrc:      Resp: 24 19 25 23   Height:      Weight:      SpO2: 96% 91% 88% 88%    Intake/Output Summary (Last 24 hours) at 07/11/13 0805 Last data filed at 07/11/13 0600  Gross per 24 hour  Intake   1328 ml  Output   1000 ml  Net    328 ml     Filed Weights   07/10/13 0500 07/10/13 1110 07/10/13 1453  Weight: 104.6 kg (230 lb 9.6 oz) 103.9 kg (229 lb 0.9 oz) 102.1 kg (225 lb 1.4 oz)    Exam:   Afebrile, blood pressure stable. Heart rate overall 90-100s.  General: Appears calm and comfortable.  Psychiatric: Grossly normal mood and affect. Speech fluent and appropriate.  Cardiovascular: Irregular, borderline tachycardia. No murmur, rub or gallop. No lower extremity edema.  Respiratory: Clear to auscultation bilaterally. No wheezes, rales or rhonchi. Normal respiratory effort.  Musculoskeletal: Left foot appears unremarkable. Right foot without significant change. There is no swelling of the foot. Toes 2 through 5 appear unremarkable with normal capillary refill. First to remain cyanotic.  Data Reviewed:  Capillary blood sugars stable  Basic metabolic panel consistent with end-stage renal disease. Potassium normal.  No significant change in leukocytosis and anemia.  INR down  to 3.52  Scheduled Meds: . allopurinol  100 mg Oral Daily  . amiodarone  100 mg Oral BID  . aspirin  81 mg Oral Daily  . atorvastatin  20 mg Oral q1800  .  docusate sodium  100 mg Oral BID  . folic acid  1 mg Oral Daily  . insulin aspart  0-5 Units Subcutaneous QHS  . insulin aspart  0-9 Units Subcutaneous TID WC  . insulin glargine  34 Units Subcutaneous QHS  . lubiprostone  8 mcg Oral BID WC  . metoprolol tartrate  50 mg Oral BID  . pantoprazole  40 mg Oral BID  . piperacillin-tazobactam (ZOSYN)  IV  2.25 g Intravenous Q8H  . sevelamer carbonate  800 mg Oral TID WC  . sodium chloride  3 mL Intravenous Q12H  . vancomycin  1,000 mg Intravenous Q T,Th,Sa-HD  . Warfarin - Pharmacist Dosing Inpatient   Does not apply Q24H   Continuous Infusions: . diltiazem (CARDIZEM) infusion 5 mg/hr (07/11/13 0600)    Principal Problem:   Acute osteomyelitis of toe of right foot Active Problems:   Anemia, chronic disease   Screening for colon cancer   Atrial fibrillation   [place] term (current) use of anticoagulants   CAD (coronary artery disease)   S/P CABG (coronary artery bypass graft)   H/O mitral valve replacement   ESRD (end stage renal disease) on dialysis   DM type 2 causing ESRD   Atrial fibrillation with RVR   Time spent 25 minutes

## 2013-07-11 NOTE — Progress Notes (Signed)
ANTICOAGULATION CONSULT NOTE  Pharmacy Consult for Coumadin Indication: atrial fibrillation  Allergies  Allergen Reactions  . Norvasc [Amlodipine Besylate]    Patient Measurements: Height: 6\' 4"  (193 cm) Weight: 225 lb 1.4 oz (102.1 kg) IBW/kg (Calculated) : 86.8  Vital Signs: Temp: 98.9 F (37.2 C) (11/14 0806) Temp src: Oral (11/13 2341) BP: 110/65 mmHg (11/14 0600) Pulse Rate: 87 (11/14 0600)  Labs:  Recent Labs  07/09/13 0532 07/10/13 0504 07/11/13 0435  HGB 9.9* 9.4* 9.8*  HCT 30.1* 28.2* 29.6*  PLT 195 208 230  LABPROT 36.5* 37.5* 34.0*  INR 3.87* 4.01* 3.52*  CREATININE 8.00* 10.32* 7.46*   Estimated Creatinine Clearance: 12.3 ml/min (by C-G formula based on Cr of 7.46).  Medical History: Past Medical History  Diagnosis Date  . UGI bleed 02/12/11    on anticoagulation; EGD w/snare polypectomy-multiple polypoid lesions antrum(benign), Chronic active gastritis (NEGATIVE H pylori)  . Anemia, chronic disease   . Sepsis due to enterococcus 02/11/11  . Atrial fibrillation     coumadin  . CAD (coronary artery disease)     s/p CABGx4  . Cardiomyopathy     40-45% EF 6/12  . Diabetes mellitus   . Hypertension   . Hyperlipemia   . Gout   . S/P patent foramen ovale closure   . History of nuclear stress test 11/2011    negative myoview  . End stage kidney disease     T/TH/SAT dialyis, Dr Fausto Skillern  . Gout    Medications:  Prescriptions prior to admission  Medication Sig Dispense Refill  . allopurinol (ZYLOPRIM) 100 MG tablet Take 100 mg by mouth daily.       Marland Kitchen amiodarone (PACERONE) 200 MG tablet Take 100 mg by mouth daily.       Marland Kitchen aspirin 81 MG tablet Take 81 mg by mouth daily.        Marland Kitchen atorvastatin (LIPITOR) 20 MG tablet Take 1 tablet (20 mg total) by mouth daily.  30 tablet  11  . B Complex-C-Zn-Folic Acid (DIALYVITE/ZINC PO) Take by mouth 3 (three) times a week.       . folic acid (FOLVITE) 1 MG tablet Take 1 mg by mouth daily.        Marland Kitchen glimepiride  (AMARYL) 4 MG tablet Take 4 mg by mouth 2 (two) times daily.        Marland Kitchen LANTUS SOLOSTAR 100 UNIT/ML injection Inject 38 Units into the skin at bedtime.       Marland Kitchen lubiprostone (AMITIZA) 8 MCG capsule Take 1 capsule (8 mcg total) by mouth 2 (two) times daily with a meal.  60 capsule  3  . metoprolol (LOPRESSOR) 50 MG tablet Take 50 mg by mouth 2 (two) times daily.       . Multiple Vitamin (MULTIVITAMIN) capsule Take 1 capsule by mouth daily.      . pantoprazole (PROTONIX) 40 MG tablet Take 40 mg by mouth 2 (two) times daily.       Marland Kitchen RENVELA 800 MG tablet Take 800 mg by mouth 3 (three) times daily with meals.       . torsemide (DEMADEX) 20 MG tablet Take 20 mg by mouth 2 (two) times daily.      . traZODone (DESYREL) 50 MG tablet Take 50 mg by mouth at bedtime.      Marland Kitchen warfarin (COUMADIN) 2.5 MG tablet Take 1.25 mg by mouth daily.       Assessment: 62yo male on chronic Coumadin PTA for h/o afib.  Home dose clarified as 1.25mg  daily. INR remains above goal despite holding warfarin doses.  No bleeding noted.   Goal of Therapy:  INR 2-3   Plan:  No warfarin today. INR daily F/U surgery plans  Elson Clan 07/11/2013,8:24 AM

## 2013-07-11 NOTE — Progress Notes (Signed)
Subjective: Minimal complaints of right foot pain.  Objective: Vital signs in last 24 hours: Temp:  [98.2 F (36.8 C)-99.1 F (37.3 C)] 98.9 F (37.2 C) (11/14 0806) Pulse Rate:  [33-159] 87 (11/14 0600) Resp:  [15-32] 23 (11/14 0600) BP: (77-148)/(34-122) 110/65 mmHg (11/14 0600) SpO2:  [80 %-98 %] 88 % (11/14 0600) Weight:  [102.1 kg (225 lb 1.4 oz)-103.9 kg (229 lb 0.9 oz)] 102.1 kg (225 lb 1.4 oz) (11/13 1453) Last BM Date: 07/10/13 (per pt diarrhea/ colace with held)  Intake/Output from previous day: 11/13 0701 - 11/14 0700 In: 1344 [P.O.:600; I.V.:394; IV Piggyback:350] Out: 1000  Intake/Output this shift:    General appearance: alert, cooperative and no distress Extremities: No swelling of the right foot. Demarcation with black skin and soft tissue noted just distal to right great toe metatarsophalangeal joint.  Lab Results:   Recent Labs  07/10/13 0504 07/11/13 0435  WBC 15.2* 16.0*  HGB 9.4* 9.8*  HCT 28.2* 29.6*  PLT 208 230   BMET  Recent Labs  07/10/13 0504 07/11/13 0435  NA 137 139  K 4.2 4.2  CL 95* 96  CO2 26 28  GLUCOSE 134* 119*  BUN 45* 28*  CREATININE 10.32* 7.46*  CALCIUM 10.4 10.4   PT/INR  Recent Labs  07/10/13 0504 07/11/13 0435  LABPROT 37.5* 34.0*  INR 4.01* 3.52*    Studies/Results: US Arterial Seg Single  07/09/2013   CLINICAL DATA:  Diabetes and right 1st toe osteomyelitis.  EXAM: NONINVASIVE PHYSIOLOGIC VASCULAR STUDY OF BILATERAL LOWER EXTREMITIES  TECHNIQUE: Evaluation of both lower extremities was performed at rest, including calculation of ankle-brachial indices, multiple segmental pressure evaluation, segmental Doppler and segmental pulse volume recording.  COMPARISON:  None.  FINDINGS: Right ABI:  Not calculable.  Left ABI:  Not calculable.  Right Lower Extremity: Arteries throughout the right lower extremity are noncompressible and accurate segmental pressure values could not be obtained. An ankle brachial index  could therefore not be calculated. Waveform analysis demonstrates essentially biphasic arterial waveforms at the femoral, superficial femoral, popliteal, post should tibial and dorsalis pedis levels. The 1st toe pressure is 127 mm of mercury with [name] normal toe brachial index of 0.81. Digital waveform show some probable component of digital small vessel disease, especially in the 1st through 3rd toes.  Left Lower Extremity: Similar to the right side, arteries are completely noncompressible throughout the left lower extremity. Accurate ABI could not be calculated. Waveforms are biphasic at the femoral, superficial femoral, popliteal, post should tibial and dorsalis pedis levels. The 1st toe pressure is 90 mm of mercury with calculated toe brachial index of 0.59. Digital waveforms show normal amplitude of the 1st toe and depressed amplitude at the level of the 2nd through 5th toes.  IMPRESSION: Noncompressible arteries throughout both lower extremities, likely related to diabetic arterial calcification. Ankle-brachial indices cannot be calculated. Based on distal waveforms, there likely is [name] component of digitals arterial disease in both feet. The 1st toe pressure is normal on the right and mildly depressed on the left.   Electronically Signed   By: Irish Lack M.D.   On: 07/09/2013 17:23    Anti-infectives: Anti-infectives   Start     Dose/Rate Route Frequency Ordered Stop   07/08/13 1200  vancomycin (VANCOCIN) IVPB 1000 mg/200 mL premix     1,000 mg 200 mL/hr over 60 Minutes Intravenous Every T-Th-Sa (Hemodialysis) 07/08/13 1010     07/08/13 0500  piperacillin-tazobactam (ZOSYN) IVPB 2.25 g  2.25 g 100 mL/hr over 30 Minutes Intravenous 3 times per day 07/07/13 2017     07/07/13 2130  vancomycin (VANCOCIN) IVPB 1000 mg/200 mL premix     1,000 mg 200 mL/hr over 60 Minutes Intravenous  Once 07/07/13 2012 07/08/13 0025   07/07/13 2030  vancomycin (VANCOCIN) IVPB 1000 mg/200 mL premix     1,000  mg 200 mL/hr over 60 Minutes Intravenous  Once 07/07/13 2012 07/07/13 2223   07/07/13 2030  piperacillin-tazobactam (ZOSYN) IVPB 3.375 g     3.375 g 100 mL/hr over 30 Minutes Intravenous  Once 07/07/13 2013 07/07/13 2153      Assessment/Plan: Impression: Gangrene of right great toe secondary to diabetes mellitus and end-stage renal disease. The toe is demarcating. I doubt this is the full reason for his leukocytosis as he has no descending cellulitis present. His arterial Doppler studies were not helpful as he has significant diabetic atherosclerotic disease of the vessels. He is still having episodes of rapid atrial fibrillation, also has an INR of 3.5. He will need amputation of right great toe. Will temporarily schedule this for 07/14/2013. I told patient that I do not feel the toe can be saved.  LOS: 4 days    [name] [name] 07/11/2013

## 2013-07-11 NOTE — Progress Notes (Signed)
Subjective: Interval History: Has no complaints of difficulty in breathing. Patient appetite is good and he doesn't have any nausea or vomiting. Presently patient offers no complaints.  Objective: Vital signs in last 24 hours: Temp:  [98.2 F (36.8 C)-99.1 F (37.3 C)] 99.1 F (37.3 C) (11/13 2341) Pulse Rate:  [25-159] 87 (11/14 0600) Resp:  [15-33] 23 (11/14 0600) BP: (77-148)/(34-122) 110/65 mmHg (11/14 0600) SpO2:  [80 %-98 %] 88 % (11/14 0600) Weight:  [102.1 kg (225 lb 1.4 oz)-103.9 kg (229 lb 0.9 oz)] 102.1 kg (225 lb 1.4 oz) (11/13 1453) Weight change: -0.7 kg (-1 lb 8.7 oz)  Intake/Output from previous day: 11/13 0701 - 11/14 0700 In: 1344 [P.O.:600; I.V.:394; IV Piggyback:350] Out: 1000  Intake/Output this shift:    General appearance: alert, cooperative and no distress Resp: clear to auscultation bilaterally Cardio: irregularly irregular rhythm GI: soft, non-tender; bowel sounds normal; no masses,  no organomegaly Extremities: edema Trace to 1+ edema  Lab Results:  Recent Labs  07/10/13 0504 07/11/13 0435  WBC 15.2* 16.0*  HGB 9.4* 9.8*  HCT 28.2* 29.6*  PLT 208 230   BMET:   Recent Labs  07/10/13 0504 07/11/13 0435  NA 137 139  K 4.2 4.2  CL 95* 96  CO2 26 28  GLUCOSE 134* 119*  BUN 45* 28*  CREATININE 10.32* 7.46*  CALCIUM 10.4 10.4   No results found for this basename: PTH,  in the last 72 hours Iron Studies: No results found for this basename: IRON, TIBC, TRANSFERRIN, FERRITIN,  in the last 72 hours  Studies/Results: US Arterial Seg Single  07/09/2013   CLINICAL DATA:  Diabetes and right 1st toe osteomyelitis.  EXAM: NONINVASIVE PHYSIOLOGIC VASCULAR STUDY OF BILATERAL LOWER EXTREMITIES  TECHNIQUE: Evaluation of both lower extremities was performed at rest, including calculation of ankle-brachial indices, multiple segmental pressure evaluation, segmental Doppler and segmental pulse volume recording.  COMPARISON:  None.  FINDINGS: Right ABI:   Not calculable.  Left ABI:  Not calculable.  Right Lower Extremity: Arteries throughout the right lower extremity are noncompressible and accurate segmental pressure values could not be obtained. An ankle brachial index could therefore not be calculated. Waveform analysis demonstrates essentially biphasic arterial waveforms at the femoral, superficial femoral, popliteal, post should tibial and dorsalis pedis levels. The 1st toe pressure is 127 mm of mercury with a normal toe brachial index of 0.81. Digital waveform show some probable component of digital small vessel disease, especially in the 1st through 3rd toes.  Left Lower Extremity: Similar to the right side, arteries are completely noncompressible throughout the left lower extremity. Accurate ABI could not be calculated. Waveforms are biphasic at the femoral, superficial femoral, popliteal, post should tibial and dorsalis pedis levels. The 1st toe pressure is 90 mm of mercury with calculated toe brachial index of 0.59. Digital waveforms show normal amplitude of the 1st toe and depressed amplitude at the level of the 2nd through 5th toes.  IMPRESSION: Noncompressible arteries throughout both lower extremities, likely related to diabetic arterial calcification. Ankle-brachial indices cannot be calculated. Based on distal waveforms, there likely is a component of digitals arterial disease in both feet. The 1st toe pressure is normal on the right and mildly depressed on the left.   Electronically Signed   By: Irish Lack M.D.   On: 07/09/2013 17:23    I have reviewed the patient's current medications.  Assessment/Plan: Problem #1 end-stage renal disease is status post hemodialysis  yesterday. His BUN is 26 creatinine is  7.46 . His potassium is normal. Patient does not  Have uremic sign and symptoms Problem #2 diabetes Problem #3 CAD Problem #4 anemia his hemoglobin and hematocrit is lowe but stable. Patient is on Epogen. Problem #5 osteomyelitis of  his right toe. Patient on antibiotics he has elevated temperature and white blood cell count. Patient presently being followed by SR. Lovell Sheehan. Problem #6 history of peripheral vascular disease Problem #7 metabolic bone disease his calcium and phosphorus was in acceptable range. Problem #8 history of chronic hypotension. Problem #9 history of a trial fibrillation. On Cardizem and his heart rate is controlled Plan: Will dialyze patient tomorrow           We'll continue with Epogen.           We'll continue wirh  low temperature dialysate           We'll we'll check his basic metabolic panel and CBC in the morning.   LOS: 4 days   Rayn Enderson S 07/11/2013,8:02 AM

## 2013-07-11 NOTE — Progress Notes (Signed)
ANTIBIOTIC CONSULT NOTE  Pharmacy Consult for Vancomycin and Zosyn Indication: cellulitis, osteomyelitis  Allergies  Allergen Reactions  . Norvasc [Amlodipine Besylate]    Patient Measurements: Height: 6\' 4"  (193 cm) Weight: 225 lb 1.4 oz (102.1 kg) IBW/kg (Calculated) : 86.8  Vital Signs: Temp: 98.9 F (37.2 C) (11/14 0806) Temp src: Oral (11/13 2341) BP: 110/65 mmHg (11/14 0600) Pulse Rate: 87 (11/14 0600) Intake/Output from previous day: 11/13 0701 - 11/14 0700 In: 1344 [P.O.:600; I.V.:394; IV Piggyback:350] Out: 1000  Intake/Output from this shift:    Labs:  Recent Labs  07/09/13 0532 07/10/13 0504 07/11/13 0435  WBC 12.4* 15.2* 16.0*  HGB 9.9* 9.4* 9.8*  PLT 195 208 230  CREATININE 8.00* 10.32* 7.46*   No results found for this basename: VANCOTROUGH, VANCOPEAK, VANCORANDOM, GENTTROUGH, GENTPEAK, GENTRANDOM, TOBRATROUGH, TOBRAPEAK, TOBRARND, AMIKACINPEAK, AMIKACINTROU, AMIKACIN,  in the last 72 hours   Microbiology: Recent Results (from the past 720 hour(s))  CULTURE, BLOOD (ROUTINE X 2)     Status: None   Collection Time    07/07/13  8:20 PM      Result Value Range Status   Specimen Description BLOOD LEFT ANTECUBITAL   Final   Special Requests BOTTLES DRAWN AEROBIC AND ANAEROBIC 10CC EACH   Final   Culture NO GROWTH 4 DAYS   Final   Report Status PENDING   Incomplete  CULTURE, BLOOD (ROUTINE X 2)     Status: None   Collection Time    07/07/13  8:25 PM      Result Value Range Status   Specimen Description BLOOD LEFT WRIST   Final   Special Requests BOTTLES DRAWN AEROBIC AND ANAEROBIC 8CC EACH   Final   Culture NO GROWTH 4 DAYS   Final   Report Status PENDING   Incomplete  MRSA PCR SCREENING     Status: None   Collection Time    07/08/13  7:00 PM      Result Value Range Status   MRSA by PCR NEGATIVE  NEGATIVE Final   Comment:            The GeneXpert MRSA Assay (FDA     approved for NASAL specimens     only), is one component of a   comprehensive MRSA colonization     surveillance program. It is not     intended to diagnose MRSA     infection nor to guide or     monitor treatment for     MRSA infections.    Medical History: Past Medical History  Diagnosis Date  . UGI bleed 02/12/11    on anticoagulation; EGD w/snare polypectomy-multiple polypoid lesions antrum(benign), Chronic active gastritis (NEGATIVE H pylori)  . Anemia, chronic disease   . Sepsis due to enterococcus 02/11/11  . Atrial fibrillation     coumadin  . CAD (coronary artery disease)     s/p CABGx4  . Cardiomyopathy     40-45% EF 6/12  . Diabetes mellitus   . Hypertension   . Hyperlipemia   . Gout   . S/P patent foramen ovale closure   . History of nuclear stress test 11/2011    negative myoview  . End stage kidney disease     T/TH/SAT dialyis, Dr Fausto Skillern  . Gout    Medications:  Scheduled:  . allopurinol  100 mg Oral Daily  . amiodarone  100 mg Oral BID  . aspirin  81 mg Oral Daily  . atorvastatin  20 mg Oral q1800  .  docusate sodium  100 mg Oral BID  . folic acid  1 mg Oral Daily  . insulin aspart  0-5 Units Subcutaneous QHS  . insulin aspart  0-9 Units Subcutaneous TID WC  . insulin glargine  34 Units Subcutaneous QHS  . lubiprostone  8 mcg Oral BID WC  . metoprolol tartrate  50 mg Oral BID  . pantoprazole  40 mg Oral BID  . piperacillin-tazobactam (ZOSYN)  IV  2.25 g Intravenous Q8H  . sevelamer carbonate  800 mg Oral TID WC  . sodium chloride  3 mL Intravenous Q12H  . vancomycin  1,000 mg Intravenous Q T,Th,Sa-HD  . Warfarin - Pharmacist Dosing Inpatient   Does not apply Q24H   Assessment: 62yo morbidly obese male with ESRD requiring dialysis admitted osteomyelitis right great toe.  Vancomycin 11/10 >> Zosyn 11/10 >>  Goal of Therapy:  Pre-Hemodialysis Vancomycin level goal range =15-25 mcg/ml  Plan:  Vancomycin 1gm IV every HD. Zosyn 2.25gm IVq8h Monitor labs, renal fxn, and cultures Check pre-HD level prior to  dose on Tuesday F/U plan for surgery, Shill-term antibiotics  Elson Clan 07/11/2013,8:34 AM

## 2013-07-12 LAB — BASIC METABOLIC PANEL
BUN: 45 mg/dL — ABNORMAL HIGH (ref 6–23)
Calcium: 10.7 mg/dL — ABNORMAL HIGH (ref 8.4–10.5)
Creatinine, Ser: 9.77 mg/dL — ABNORMAL HIGH (ref 0.50–1.35)
GFR calc Af Amer: 6 mL/min — ABNORMAL LOW (ref >90)

## 2013-07-12 LAB — CULTURE, BLOOD (ROUTINE X 2): Culture: NO GROWTH

## 2013-07-12 LAB — GLUCOSE, CAPILLARY
Glucose-Capillary: 129 mg/dL — ABNORMAL HIGH (ref 70–99)
Glucose-Capillary: 105 mg/dL — ABNORMAL HIGH (ref 70–99)
Glucose-Capillary: 162 mg/dL — ABNORMAL HIGH (ref 70–99)

## 2013-07-12 LAB — CBC
MCH: 31.6 pg (ref 26.0–34.0)
MCV: 95.5 fL (ref 78.0–100.0)
Platelets: 241 10*3/uL (ref 150–400)
RDW: 14.6 % (ref 11.5–15.5)

## 2013-07-12 LAB — TSH: TSH: 0.411 u[IU]/mL (ref 0.350–4.500)

## 2013-07-12 MED ORDER — ALTEPLASE 2 MG IJ SOLR: 2.0000 mg | Freq: Once | INTRAMUSCULAR | Status: AC | PRN

## 2013-07-12 MED ORDER — DILTIAZEM HCL 30 MG PO TABS
30.0000 mg | ORAL_TABLET | Freq: Four times a day (QID) | ORAL | Status: DC
  Administered 2013-07-12 (×3): 30 mg via ORAL
  Filled 2013-07-12 (×3): qty 1

## 2013-07-12 MED ORDER — SODIUM CHLORIDE 0.9 % IV SOLN: 100.0000 mL | INTRAVENOUS | Status: DC | PRN

## 2013-07-12 MED ORDER — PHYTONADIONE 1 MG/0.5 ML ORAL SOLUTION
1.0000 mg | Freq: Once | ORAL | Status: AC
  Administered 2013-07-12: 1 mg via ORAL
  Filled 2013-07-12: qty 0.5

## 2013-07-12 MED ORDER — DILTIAZEM HCL 60 MG PO TABS
60.0000 mg | ORAL_TABLET | Freq: Four times a day (QID) | ORAL | Status: DC
  Administered 2013-07-13 – 2013-07-15 (×8): 60 mg via ORAL
  Filled 2013-07-12 (×8): qty 1

## 2013-07-12 NOTE — Progress Notes (Signed)
INR is still elevated. Discussed treatment with Dr. Irene Limbo. We'll reevaluate in a.m. Still plan on surgery for 07/14/2013.

## 2013-07-12 NOTE — Progress Notes (Signed)
[facility] PROGRESS NOTE  COURAGE BIGLOW WUJ:[id number] DOB: January 31, 1951 DOA: 07/07/2013 PCP: Cassell Smiles., MD Followed by Dr. Alanda Amass, with cardiology. Followed by Dr. Kristian Covey with nephrology. He gets dialyzed Tuesday, Thursdays, and Saturdays  Assessment/Plan: 1. Osteomyelitis of the right great toe with associated sepsis and developing gangrene: Hemodynamically stable. Afebrile. White blood cell count without significant change. Continue antibiotics. Surgery is planned in the near future for definitive management. 2. Atrial fibrillation with rapid ventricular response: Borderline control, continue metoprolol, add diltiazem. Warfarin on hold. 3. Type 2 diabetes mellitus: Stable. Continue Lantus and sliding scale. 4. Stage renal disease on hemodialysis: Management deferred to nephrology 5. Normocytic anemia: Likely secondary chronic disease. Stable. 6. History of coronary artery disease 7. History of left ventricular ejection fraction 35% by 2-D echo 12/2006. Asymptomatic. 8. Gout   Continue amiodarone, metoprolol. Add low-dose diltiazem.  Check TSH  Low-dose vitamin K oral, repeat INR in the morning  Continue antibiotics. Surgery planned in 48 hours.  Pending studies:   TSH  Code Status: full code DVT prophylaxis: INR currently is supratherapeutic. Lovenox when INR less than 2. Family Communication: None present Disposition Plan: pending further evaluation  Brendia Sacks, MD  [facility]  Pager [phone] If 7PM-7AM, please contact night-coverage at [email or web address], password [facility] 07/12/2013, 8:23 AM  LOS: 5 days   Summary: 62 year old man with history of end-stage renal disease, diabetes mellitus, mitral valve replacement with tissue valve, status post CABG, atrial fibrillation maintained on warfarin who presented to the emergency department with a history of having had his nail, developed pain, swelling and redness. X-ray suggested osteomyelitis and patient  was admitted for further evaluation. Shortly after hospitalization he developed atrial fibrillation with rapid ventricular response.  Consultants:  Nephrology  General surgery  Procedures:    Antibiotics:  Vancomycin 11/10 >>   Zosyn 11/10 >>   HPI/Subjective: No new issues. Feels okay. No pain in the foot. Breathing fine.  Objective: Filed Vitals:   07/12/13 0000 07/12/13 0400 07/12/13 0500 07/12/13 0806  BP: 111/87 111/82    Pulse: 109     Temp: 99.1 F (37.3 C) 99.4 F (37.4 C)  99.4 F (37.4 C)  TempSrc: Oral Oral    Resp: 19 14    Height:      Weight:   102.8 kg (226 lb 10.1 oz)   SpO2: 93%       Intake/Output Summary (Last 24 hours) at 07/12/13 0823 Last data filed at 07/11/13 2200  Gross per 24 hour  Intake    830 ml  Output      0 ml  Net    830 ml     Filed Weights   07/10/13 1110 07/10/13 1453 07/12/13 0500  Weight: 103.9 kg (229 lb 0.9 oz) 102.1 kg (225 lb 1.4 oz) 102.8 kg (226 lb 10.1 oz)    Exam:   Afebrile, blood pressure stable. Heart rate overall 90-100s.  General: Appears calm and comfortable.  Cardiovascular: Irregular. Mild tachycardia. No murmur, rub or gallop. No lower extremity edema.  Respiratory: Clear to auscultation bilaterally. No wheezes, rales or rhonchi. Normal respiratory effort.  Left foot: Appears unremarkable. Right foot without significant change. Toe bandaged.  Data Reviewed:  Capillary blood sugars stable  BMP consistent with end-stage renal disease. Potassium normal.  White blood cell count without significant change. Anemia is stable at 9.9.  INR without significant change, 3.44.  Scheduled Meds: . allopurinol  100 mg Oral Daily  . amiodarone  100 mg Oral BID  .  aspirin  81 mg Oral Daily  . atorvastatin  20 mg Oral q1800  . docusate sodium  100 mg Oral BID  . epoetin alfa  10,000 Units Intravenous Q T,Th,Sa-HD  . folic acid  1 mg Oral Daily  . insulin aspart  0-5 Units Subcutaneous QHS  . insulin  aspart  0-9 Units Subcutaneous TID WC  . insulin glargine  34 Units Subcutaneous QHS  . lubiprostone  8 mcg Oral BID WC  . metoprolol tartrate  100 mg Oral BID  . pantoprazole  40 mg Oral BID  . piperacillin-tazobactam (ZOSYN)  IV  2.25 g Intravenous Q8H  . sevelamer carbonate  800 mg Oral TID WC  . sodium chloride  3 mL Intravenous Q12H  . vancomycin  1,000 mg Intravenous Q T,Th,Sa-HD   Continuous Infusions:    Principal Problem:   Acute osteomyelitis of toe of right foot Active Problems:   Anemia, chronic disease   Screening for colon cancer   Atrial fibrillation   Bill term (current) use of anticoagulants   CAD (coronary artery disease)   S/P CABG (coronary artery bypass graft)   H/O mitral valve replacement   ESRD (end stage renal disease) on dialysis   DM type 2 causing ESRD   Atrial fibrillation with RVR   Time spent 20 minutes

## 2013-07-12 NOTE — Progress Notes (Signed)
[name] [name]  MRN: [id number]  DOB/AGE: October 25, 1950 62 y.o.  Primary Care Physician:FUSCO,LAWRENCE J., MD  Admit date: 07/07/2013  Chief Complaint:  Chief Complaint  Patient presents with  . Toe Pain    S-Pt presented on  07/07/2013 with  Chief Complaint  Patient presents with  . Toe Pain  .    Pt today offers no new complaints .  Meds . allopurinol  100 mg Oral Daily  . amiodarone  100 mg Oral BID  . aspirin  81 mg Oral Daily  . atorvastatin  20 mg Oral q1800  . docusate sodium  100 mg Oral BID  . epoetin alfa  10,000 Units Intravenous Q T,Th,Sa-HD  . folic acid  1 mg Oral Daily  . insulin aspart  0-5 Units Subcutaneous QHS  . insulin aspart  0-9 Units Subcutaneous TID WC  . insulin glargine  34 Units Subcutaneous QHS  . lubiprostone  8 mcg Oral BID WC  . metoprolol tartrate  100 mg Oral BID  . pantoprazole  40 mg Oral BID  . piperacillin-tazobactam (ZOSYN)  IV  2.25 g Intravenous Q8H  . sevelamer carbonate  800 mg Oral TID WC  . sodium chloride  3 mL Intravenous Q12H  . vancomycin  1,000 mg Intravenous Q T,Th,Sa-HD      Physical Exam: Vital signs in last 24 hours: Temp:  [98.6 F (37 C)-99.4 F (37.4 C)] 99.4 F (37.4 C) (11/15 0400) Pulse Rate:  [27-126] 109 (11/15 0000) Resp:  [14-31] 14 (11/15 0400) BP: (76-124)/(37-87) 111/82 mmHg (11/15 0400) SpO2:  [83 %-99 %] 93 % (11/15 0000) Weight:  [226 lb 10.1 oz (102.8 kg)] 226 lb 10.1 oz (102.8 kg) (11/15 0500) Weight change: -2 lb 6.8 oz (-1.1 kg) Last BM Date: 07/11/13  Intake/Output from previous day: 11/14 0701 - 11/15 0700 In: 840 [P.O.:720; I.V.:70; IV Piggyback:50] Out: -      Physical Exam: General- pt is awake,alert, oriented to time place and person Resp- No acute REsp distress, CTA B/L NO Rhonchi CVS- S1S2 Irregular in rate and rhythm GIT- BS+, soft, NT, ND EXT- NO LE Edema, Right Great Toe Cyanosis+ Swelling +in bandage Access -Right AVF +  Lab Results: CBC  Recent Labs   07/11/13 0435 07/12/13 0533  WBC 16.0* 15.7*  HGB 9.8* 9.9*  HCT 29.6* 29.9*  PLT 230 241    BMET  Recent Labs  07/11/13 0435 07/12/13 0533  NA 139 135  K 4.2 4.2  CL 96 94*  CO2 28 26  GLUCOSE 119* 133*  BUN 28* 45*  CREATININE 7.46* 9.77*  CALCIUM 10.4 10.7*    MICRO Recent Results (from the past 240 hour(s))  CULTURE, BLOOD (ROUTINE X 2)     Status: None   Collection Time    07/07/13  8:20 PM      Result Value Range Status   Specimen Description BLOOD LEFT ANTECUBITAL   Final   Special Requests BOTTLES DRAWN AEROBIC AND ANAEROBIC 10CC EACH   Final   Culture NO GROWTH 5 DAYS   Final   Report Status 07/12/2013 FINAL   Final  CULTURE, BLOOD (ROUTINE X 2)     Status: None   Collection Time    07/07/13  8:25 PM      Result Value Range Status   Specimen Description BLOOD LEFT WRIST   Final   Special Requests BOTTLES DRAWN AEROBIC AND ANAEROBIC 8CC EACH   Final   Culture NO GROWTH 5 DAYS  Final   Report Status 07/12/2013 FINAL   Final  MRSA PCR SCREENING     Status: None   Collection Time    07/08/13  7:00 PM      Result Value Range Status   MRSA by PCR NEGATIVE  NEGATIVE Final   Comment:            The GeneXpert MRSA Assay (FDA     approved for NASAL specimens     only), is one component of a     comprehensive MRSA colonization     surveillance program. It is not     intended to diagnose MRSA     infection nor to guide or     monitor treatment for     MRSA infections.      Lab Results  Component Value Date   CALCIUM 10.7* 07/12/2013   PHOS 4.5 07/12/2013         Impression: 1)Renal  ESRD on HD                ON TTS schedule                 Pt is to be Dialyzed today   2)HTN  BP on lower side  Target Organ damage  CKD CAD CHF Medication- On Beta blockers    3)Anemia HGb at goal (9--11)   4)CKD Mineral-Bone Disorder Phosphorus at goal. Calcium at goal.  5)ID-admitted with Osteomyelitis Primary MD  following  6)Electrolytes Normokalemic NOrmonatremic   7)Acid base Co2 at goal  8) Afib- On Coumadin and Amiodarone     Plan:  Will dialyze today  Will dialyze with lower calcium bath      Janara Klett S 07/12/2013, 7:28 AM

## 2013-07-12 NOTE — Procedures (Signed)
   HEMODIALYSIS TREATMENT NOTE:  4 hour heparin-free dialysis completed via right forearm AVF (15g/antegrade).  Goal was NOT met; pt was within 0.5kg of his reported dry weight when HD was initiated.  Tolerated removal of 2 liters.  Ultrafiltration was interrupted x45 minutes for asymptomatic hypotension.  HR 120-130s, despite administration of po Cardizem.  Pt was given Epogen, Vancomycin, and Zosyn with dialysis.  All blood was reinfused and hemostasis was achieved within 15 minutes.  Report given to Laury Axon, RN.  Atonya Templer L. Willodene Stallings, RN, CDN

## 2013-07-13 LAB — GLUCOSE, CAPILLARY
Glucose-Capillary: 149 mg/dL — ABNORMAL HIGH (ref 70–99)
Glucose-Capillary: 183 mg/dL — ABNORMAL HIGH (ref 70–99)

## 2013-07-13 LAB — PROTIME-INR: INR: 1.86 — ABNORMAL HIGH (ref 0.00–1.49)

## 2013-07-13 MED ORDER — SODIUM CHLORIDE 0.9 % IV BOLUS (SEPSIS)
500.0000 mL | Freq: Once | INTRAVENOUS | Status: AC
  Administered 2013-07-13: 500 mL via INTRAVENOUS

## 2013-07-13 MED ORDER — INSULIN GLARGINE 100 UNIT/ML ~~LOC~~ SOLN
17.0000 [IU] | Freq: Every day | SUBCUTANEOUS | Status: DC
  Administered 2013-07-13: 17 [IU] via SUBCUTANEOUS
  Filled 2013-07-13 (×2): qty 0.17

## 2013-07-13 MED ORDER — ENOXAPARIN SODIUM 40 MG/0.4ML ~~LOC~~ SOLN: 40.0000 mg | SUBCUTANEOUS | Status: DC

## 2013-07-13 MED ORDER — ENOXAPARIN SODIUM 30 MG/0.3ML ~~LOC~~ SOLN
30.0000 mg | SUBCUTANEOUS | Status: AC
  Administered 2013-07-13: 30 mg via SUBCUTANEOUS
  Filled 2013-07-13: qty 0.3

## 2013-07-13 NOTE — Progress Notes (Signed)
2122 Pt's heart rate increased to 130s post-dialysis. Amiodorone given. No change in heart rate. Metoprolol given with BP of 101/64 and HR of 133.  2330 BP dropped to 78/57, and midlevel notified. Orders given for NS bolus. Bolus started. 0055 Pt's BP up to 89/55. Cardizem held due to low BP.

## 2013-07-13 NOTE — Progress Notes (Signed)
Patient scheduled for right great toe amputation tomorrow in a.m. INR is down to 1.85. Heart rate difficulties are noted. The risks and benefits of the procedure were fully explained to the patient, who gave informed consent.

## 2013-07-13 NOTE — Progress Notes (Signed)
[facility] PROGRESS NOTE  [name] [name]:[id number] DOB: 1951-05-04 DOA: 07/07/2013 PCP: Cassell Smiles., MD Followed by Dr. Alanda Amass, with cardiology. Followed by Dr. Kristian Covey with nephrology. He gets dialyzed Tuesday, Thursdays, and Saturdays  Assessment/Plan: 1. Osteomyelitis of the right great toe with associated sepsis and developing gangrene: Hemodynamically stable. Afebrile. White blood cell count without significant change. Continue antibiotics. Surgery is planned in the near future for definitive management. 2. Atrial fibrillation with rapid ventricular response: Acceptable control, continue metoprolol, diltiazem. INR now below 2. Expect rate will improve with surgery. 3. Type 2 diabetes mellitus: Well controlled. Continue Lantus and sliding scale. 4. End stage renal disease on hemodialysis: Management deferred to nephrology 5. Normocytic anemia: Likely secondary chronic disease. Stable. 6. History of coronary artery disease 7. History of left ventricular ejection fraction 35% by 2-D echo 12/2006. Asymptomatic. 8. Gout   Half dose Lantus tonight in preparation for surgery tomorrow  Continue metoprolol, amiodarone and diltiazem  Start prophylactic Lovenox one dose today  Anticipated surgery 11/17  Pending studies:   none  Code Status: full code DVT prophylaxis: Lovenox Family Communication: None present Disposition Plan: pending further evaluation  Brendia Sacks, MD  [facility]  Pager [phone] If 7PM-7AM, please contact night-coverage at [email or web address], password [facility] 07/13/2013, 8:13 AM  LOS: 6 days   Summary: 62 year old man with history of end-stage renal disease, diabetes mellitus, mitral valve replacement with tissue valve, status post CABG, atrial fibrillation maintained on warfarin who presented to the emergency department with a history of having had his nail, developed pain, swelling and redness. X-ray suggested osteomyelitis and patient  was admitted for further evaluation. Shortly after hospitalization he developed atrial fibrillation with rapid ventricular response.  Consultants:  Nephrology  General surgery  Procedures:    Antibiotics:  Vancomycin 11/10 >>   Zosyn 11/10 >>   HPI/Subjective: Dialysis was complicated yesterday by asymptomatic hypotension and heart rate in the 130s. Cardizem was held and patient was given a bolus. Patient has no complaints today. No foot pain. Feels well.  Objective: Filed Vitals:   07/13/13 0030 07/13/13 0045 07/13/13 0055 07/13/13 0100  BP: 69/21 72/31 89/55  97/37  Pulse: 60 43 65 64  Temp:      TempSrc:      Resp: 20 27 22 21   Height:      Weight:      SpO2: 98% 96% 98% 96%    Intake/Output Summary (Last 24 hours) at 07/13/13 0813 Last data filed at 07/12/13 2200  Gross per 24 hour  Intake   1240 ml  Output   2070 ml  Net   -830 ml     Filed Weights   07/10/13 1453 07/12/13 0500 07/12/13 1440  Weight: 102.1 kg (225 lb 1.4 oz) 102.8 kg (226 lb 10.1 oz) 103.2 kg (227 lb 8.2 oz)    Exam:   Afebrile, blood pressure stable.   General: Appears calm and comfortable.  Psychiatric: Grossly normal mood and affect. Speech fluent and appropriate.  Cardiovascular: Irregular, heart rate 90-110, no murmur rub or gallop. No right lower extremity edema noted.  Respiratory: Clear to auscultation bilaterally. No wheezes, rales or rhonchi. Normal respiratory effort.  Data Reviewed:  INR 1.86  Scheduled Meds: . allopurinol  100 mg Oral Daily  . amiodarone  100 mg Oral BID  . aspirin  81 mg Oral Daily  . atorvastatin  20 mg Oral q1800  . diltiazem  60 mg Oral Q6H  . docusate sodium  100 mg Oral  BID  . epoetin alfa  10,000 Units Intravenous Q T,Th,Sa-HD  . folic acid  1 mg Oral Daily  . insulin aspart  0-5 Units Subcutaneous QHS  . insulin aspart  0-9 Units Subcutaneous TID WC  . insulin glargine  34 Units Subcutaneous QHS  . lubiprostone  8 mcg Oral BID WC   . metoprolol tartrate  100 mg Oral BID  . pantoprazole  40 mg Oral BID  . piperacillin-tazobactam (ZOSYN)  IV  2.25 g Intravenous Q8H  . sevelamer carbonate  800 mg Oral TID WC  . sodium chloride  3 mL Intravenous Q12H  . vancomycin  1,000 mg Intravenous Q T,Th,Sa-HD   Continuous Infusions:    Principal Problem:   Acute osteomyelitis of toe of right foot Active Problems:   Anemia, chronic disease   Screening for colon cancer   Atrial fibrillation   Thalman term (current) use of anticoagulants   CAD (coronary artery disease)   S/P CABG (coronary artery bypass graft)   H/O mitral valve replacement   ESRD (end stage renal disease) on dialysis   DM type 2 causing ESRD   Atrial fibrillation with RVR   Time spent 20 minutes

## 2013-07-13 NOTE — Progress Notes (Signed)
Consent signed for surgery tommorow, placed in paper chart. Patient states that he understands procedure, and has no questions.

## 2013-07-14 ENCOUNTER — Encounter (HOSPITAL_COMMUNITY): Payer: Self-pay | Admitting: *Deleted

## 2013-07-14 ENCOUNTER — Encounter (HOSPITAL_COMMUNITY): Payer: Medicare Other | Admitting: Anesthesiology

## 2013-07-14 ENCOUNTER — Encounter (HOSPITAL_COMMUNITY)
Admission: EM | Disposition: A | Discharge status: Home Health | Payer: Self-pay | Source: Home / Self Care | Attending: Family Medicine

## 2013-07-14 ENCOUNTER — Inpatient Hospital Stay (HOSPITAL_COMMUNITY): Payer: Medicare Other | Admitting: Anesthesiology

## 2013-07-14 HISTORY — PX: AMPUTATION: SHX166

## 2013-07-14 LAB — CBC WITH DIFFERENTIAL/PLATELET
Basophils Absolute: 0 10*3/uL (ref 0.0–0.1)
Basophils Relative: 0 % (ref 0–1)
Eosinophils Absolute: 0.2 10*3/uL (ref 0.0–0.7)
Eosinophils Relative: 2 % (ref 0–5)
HCT: 30.6 % — ABNORMAL LOW (ref 39.0–52.0)
Hemoglobin: 10 g/dL — ABNORMAL LOW (ref 13.0–17.0)
Lymphocytes Relative: 5 % — ABNORMAL LOW (ref 12–46)
Lymphs Abs: 0.6 10*3/uL — ABNORMAL LOW (ref 0.7–4.0)
MCH: 31.4 pg (ref 26.0–34.0)
MCHC: 32.7 g/dL (ref 30.0–36.0)
MCV: 96.2 fL (ref 78.0–100.0)
Monocytes Absolute: 0.8 10*3/uL (ref 0.1–1.0)
Monocytes Relative: 6 % (ref 3–12)
Neutrophils Relative %: 88 % — ABNORMAL HIGH (ref 43–77)
RBC: 3.18 MIL/uL — ABNORMAL LOW (ref 4.22–5.81)
RDW: 14.7 % (ref 11.5–15.5)

## 2013-07-14 LAB — BASIC METABOLIC PANEL
BUN: 41 mg/dL — ABNORMAL HIGH (ref 6–23)
BUN: 44 mg/dL — ABNORMAL HIGH (ref 6–23)
CO2: 29 mEq/L (ref 19–32)
CO2: 26 mEq/L (ref 19–32)
Calcium: 10.4 mg/dL (ref 8.4–10.5)
Calcium: 10.5 mg/dL (ref 8.4–10.5)
Chloride: 95 mEq/L — ABNORMAL LOW (ref 96–112)
Creatinine, Ser: 8.82 mg/dL — ABNORMAL HIGH (ref 0.50–1.35)
Creatinine, Ser: 8.93 mg/dL — ABNORMAL HIGH (ref 0.50–1.35)
GFR calc Af Amer: 6 mL/min — ABNORMAL LOW (ref >90)
GFR calc Af Amer: 6 mL/min — ABNORMAL LOW (ref >90)
Sodium: 138 mEq/L (ref 135–145)

## 2013-07-14 LAB — PROTIME-INR
INR: 1.6 — ABNORMAL HIGH (ref 0.00–1.49)
Prothrombin Time: 18.6 seconds — ABNORMAL HIGH (ref 11.6–15.2)

## 2013-07-14 LAB — GLUCOSE, CAPILLARY
Glucose-Capillary: 119 mg/dL — ABNORMAL HIGH (ref 70–99)
Glucose-Capillary: 169 mg/dL — ABNORMAL HIGH (ref 70–99)

## 2013-07-14 SURGERY — AMPUTATION DIGIT
Anesthesia: Monitor Anesthesia Care | Site: Toe | Laterality: Right | Wound class: Dirty or Infected

## 2013-07-14 MED ORDER — BUPIVACAINE HCL (PF) 0.5 % IJ SOLN
INTRAMUSCULAR | Status: AC
  Filled 2013-07-14: qty 30

## 2013-07-14 MED ORDER — ONDANSETRON HCL 4 MG/2ML IJ SOLN: 4.0000 mg | Freq: Once | INTRAMUSCULAR | Status: DC | PRN

## 2013-07-14 MED ORDER — MIDAZOLAM HCL 2 MG/2ML IJ SOLN
1.0000 mg | INTRAMUSCULAR | Status: DC | PRN
  Administered 2013-07-14: 2 mg via INTRAVENOUS

## 2013-07-14 MED ORDER — LIDOCAINE HCL (PF) 1 % IJ SOLN
INTRAMUSCULAR | Status: AC
  Filled 2013-07-14: qty 5

## 2013-07-14 MED ORDER — PROPOFOL 10 MG/ML IV EMUL
INTRAVENOUS | Status: AC
  Filled 2013-07-14: qty 20

## 2013-07-14 MED ORDER — MIDAZOLAM HCL 2 MG/2ML IJ SOLN
INTRAMUSCULAR | Status: AC
  Filled 2013-07-14: qty 2

## 2013-07-14 MED ORDER — LIDOCAINE HCL (PF) 1 % IJ SOLN
INTRAMUSCULAR | Status: AC
Start: 2013-07-14 — End: 2013-07-14
  Filled 2013-07-14: qty 30

## 2013-07-14 MED ORDER — SODIUM CHLORIDE 0.9 % IV SOLN
INTRAVENOUS | Status: DC
  Administered 2013-07-14: 08:00:00 via INTRAVENOUS

## 2013-07-14 MED ORDER — FENTANYL CITRATE 0.05 MG/ML IJ SOLN: 25.0000 ug | INTRAMUSCULAR | Status: DC | PRN

## 2013-07-14 MED ORDER — MIDAZOLAM HCL 5 MG/5ML IJ SOLN
INTRAMUSCULAR | Status: DC | PRN
  Administered 2013-07-14: 1 mg via INTRAVENOUS

## 2013-07-14 MED ORDER — FENTANYL CITRATE 0.05 MG/ML IJ SOLN
INTRAMUSCULAR | Status: AC
  Filled 2013-07-14: qty 2

## 2013-07-14 MED ORDER — INSULIN GLARGINE 100 UNIT/ML ~~LOC~~ SOLN
24.0000 [IU] | Freq: Every day | SUBCUTANEOUS | Status: DC
  Administered 2013-07-14 – 2013-07-17 (×4): 24 [IU] via SUBCUTANEOUS
  Filled 2013-07-14 (×7): qty 0.24

## 2013-07-14 MED ORDER — FENTANYL CITRATE 0.05 MG/ML IJ SOLN
25.0000 ug | INTRAMUSCULAR | Status: AC
  Administered 2013-07-14 (×3): 25 ug via INTRAVENOUS

## 2013-07-14 MED ORDER — PROPOFOL INFUSION 10 MG/ML OPTIME
INTRAVENOUS | Status: DC | PRN
  Administered 2013-07-14: 100 ug/kg/min via INTRAVENOUS

## 2013-07-14 MED ORDER — LIDOCAINE HCL 1 % IJ SOLN
INTRAMUSCULAR | Status: DC | PRN
  Administered 2013-07-14: 14 mL

## 2013-07-14 MED ORDER — CHLORHEXIDINE GLUCONATE 4 % EX LIQD
1.0000 "application " | Freq: Once | CUTANEOUS | Status: AC
  Administered 2013-07-14: 1 via TOPICAL

## 2013-07-14 MED ORDER — SODIUM CHLORIDE 0.9 % IR SOLN
Status: DC | PRN
  Administered 2013-07-14: 1000 mL

## 2013-07-14 SURGICAL SUPPLY — 40 items
0.5% BUPIVICAINE 30 ML ×2 IMPLANT
BAG HAMPER (MISCELLANEOUS) ×2 IMPLANT
BANDAGE ELASTIC 4 VELCRO NS (GAUZE/BANDAGES/DRESSINGS) IMPLANT
BANDAGE GAUZE ELAST BULKY 4 IN (GAUZE/BANDAGES/DRESSINGS) IMPLANT
BLADE GIGLI SAW 510M (BLADE) IMPLANT
BLADE OSC/SAGITTAL MD 9X18.5 (BLADE) IMPLANT
CANISTER WOUND CARE 500ML ATS (WOUND CARE) ×2 IMPLANT
CLOTH BEACON ORANGE TIMEOUT ST (SAFETY) ×2 IMPLANT
COVER LIGHT HANDLE STERIS (MISCELLANEOUS) ×4 IMPLANT
DRSG VAC ATS SM SENSATRAC (GAUZE/BANDAGES/DRESSINGS) ×2 IMPLANT
DRSG XEROFORM 1X8 (GAUZE/BANDAGES/DRESSINGS) IMPLANT
ELECT REM PT RETURN 9FT ADLT (ELECTROSURGICAL) ×2
ELECTRODE REM PT RTRN 9FT ADLT (ELECTROSURGICAL) ×1 IMPLANT
FORMALIN 10 PREFIL 120ML (MISCELLANEOUS) ×2 IMPLANT
GLOVE BIOGEL PI IND STRL 7.5 (GLOVE) ×1 IMPLANT
GLOVE BIOGEL PI IND STRL 8 (GLOVE) ×2 IMPLANT
GLOVE BIOGEL PI INDICATOR 7.5 (GLOVE) ×1
GLOVE BIOGEL PI INDICATOR 8 (GLOVE) ×2
GLOVE ECLIPSE 7.5 STRL STRAW (GLOVE) ×2 IMPLANT
GLOVE SS BIOGEL STRL SZ 6.5 (GLOVE) ×1 IMPLANT
GLOVE SUPERSENSE BIOGEL SZ 6.5 (GLOVE) ×1
GOWN STRL REIN XL XLG (GOWN DISPOSABLE) ×6 IMPLANT
INST SET MINOR BONE (KITS) ×2 IMPLANT
KIT ROOM TURNOVER APOR (KITS) ×2 IMPLANT
MANIFOLD NEPTUNE II (INSTRUMENTS) ×2 IMPLANT
NEEDLE HYPO 25X1 1.5 SAFETY (NEEDLE) ×2 IMPLANT
NS IRRIG 1000ML POUR BTL (IV SOLUTION) ×2 IMPLANT
PACK BASIC LIMB (CUSTOM PROCEDURE TRAY) ×2 IMPLANT
PAD ARMBOARD 7.5X6 YLW CONV (MISCELLANEOUS) ×2 IMPLANT
SET BASIN LINEN APH (SET/KITS/TRAYS/PACK) ×2 IMPLANT
SOL PREP PROV IODINE SCRUB 4OZ (MISCELLANEOUS) ×2 IMPLANT
SPONGE GAUZE 4X4 12PLY (GAUZE/BANDAGES/DRESSINGS) ×2 IMPLANT
SPONGE LAP 18X18 X RAY DECT (DISPOSABLE) ×2 IMPLANT
SUT ETHILON 3 0 FSL (SUTURE) IMPLANT
SUT PROLENE 2 0 FS (SUTURE) IMPLANT
SUT VIC AB 3-0 SH 27 (SUTURE) ×1
SUT VIC AB 3-0 SH 27XBRD (SUTURE) ×1 IMPLANT
SWAB CULTURE LIQ STUART DBL (MISCELLANEOUS) IMPLANT
SYR CONTROL 10ML LL (SYRINGE) ×2 IMPLANT
TUBE ANAEROBIC PORT A CUL  W/M (MISCELLANEOUS) IMPLANT

## 2013-07-14 NOTE — Transfer of Care (Signed)
Immediate Anesthesia Transfer of Care Note  Patient: [name] [name]  Procedure(s) Performed: Procedure(s): AMPUTATION DIGIT (Right)  Patient Location: PACU  Anesthesia Type:MAC  Level of Consciousness: awake  Airway & Oxygen Therapy: Patient Spontanous Breathing  Post-op Assessment: Report given to PACU RN  Post vital signs: Reviewed  Complications: No apparent anesthesia complications

## 2013-07-14 NOTE — Progress Notes (Signed)
Subjective: Interval History: Has no complaints of difficulty in breathing. Patient appetite is good and he doesn't have any nausea or vomiting. Presently patient offers no complaints. His s/p amputation of his toe  Objective: Vital signs in last 24 hours: Temp:  [97.8 F (36.6 C)-98.7 F (37.1 C)] 97.9 F (36.6 C) (11/17 0845) Pulse Rate:  [25-130] 70 (11/17 0930) Resp:  [6-29] 18 (11/17 0930) BP: (65-166)/(39-90) 119/65 mmHg (11/17 0930) SpO2:  [83 %-100 %] 97 % (11/17 0930) Weight:  [103.7 kg (228 lb 9.9 oz)-109.4 kg (241 lb 2.9 oz)] 109.4 kg (241 lb 2.9 oz) (11/17 0930) Weight change: 0.5 kg (1 lb 1.6 oz)  Intake/Output from previous day:   Intake/Output this shift: Total I/O In: 445 [P.O.:120; I.V.:325] Out: 0   General appearance: alert, cooperative and no distress Resp: clear to auscultation bilaterally Cardio: irregularly irregular rhythm GI: soft, non-tender; bowel sounds normal; no masses,  no organomegaly Extremities: edema Trace to 1+ edema  Lab Results:  Recent Labs  07/12/13 0533  WBC 15.7*  HGB 9.9*  HCT 29.9*  PLT 241   BMET:   Recent Labs  07/12/13 0533 07/14/13 0457  NA 135 138  K 4.2 3.8  CL 94* 95*  CO2 26 29  GLUCOSE 133* 116*  BUN 45* 41*  CREATININE 9.77* 8.82*  CALCIUM 10.7* 10.4   No results found for this basename: PTH,  in the last 72 hours Iron Studies: No results found for this basename: IRON, TIBC, TRANSFERRIN, FERRITIN,  in the last 72 hours  Studies/Results: No results found.  I have reviewed the patient's current medications.  Assessment/Plan: Problem #1 end-stage renal disease is status post hemodialysis  yesterday. His BUN is 42 nad creatinine is 8.82 . His potassium is normal. Patient does not  Have uremic sign and symptoms Problem #2 diabetes Problem #3 CAD Problem #4 anemia his hemoglobin and hematocrit is lowe but stable. Patient is on Epogen. Problem #5 osteomyelitis of his right toe. S/P amputation Problem #6  history of peripheral vascular disease Problem #7 metabolic bone disease his calcium and phosphorus was in acceptable range. Problem #8 history of chronic hypotension. Problem #9 history of a trial fibrillation. On Cardizem and his heart rate is controlled Plan: Will dialyze patient tomorrow           We'll continue wirh  low temperature dialysate           We'll we'll check his basic metabolic panel and CBC in the morning.   LOS: 7 days   Leighana Neyman S 07/14/2013,9:58 AM

## 2013-07-14 NOTE — Anesthesia Postprocedure Evaluation (Signed)
  Anesthesia Post-op Note  Patient: [name] [name]  Procedure(s) Performed: Procedure(s): AMPUTATION DIGIT (Right)  Patient Location: PACU  Anesthesia Type:MAC  Level of Consciousness: awake, alert  and oriented  Airway and Oxygen Therapy: Patient Spontanous Breathing and Patient connected to nasal cannula oxygen  Post-op Pain: none  Post-op Assessment: Post-op Vital signs reviewed, Patient's Cardiovascular Status Stable, Respiratory Function Stable, Patent Airway and No signs of Nausea or vomiting  Post-op Vital Signs: Reviewed and stable  Complications: No apparent anesthesia complications

## 2013-07-14 NOTE — Progress Notes (Signed)
[facility] PROGRESS NOTE  [name] [name] WUJ:[id number] DOB: [birth year] DOA: 07/07/2013 PCP: Cassell Smiles., MD Followed by Dr. Alanda Amass, with cardiology. Followed by Dr. Kristian Covey with nephrology. He gets dialyzed Tuesday, Thursdays, and Saturdays  Assessment/Plan: 1. Osteomyelitis of the right great toe with associated sepsis and developing gangrene: Now status post amputation. Continue empiric antibiotics. 2. Atrial fibrillation with rapid ventricular response: Acceptable control, continue metoprolol, diltiazem.  3. Type 2 diabetes mellitus: Well controlled. Continue Lantus and sliding scale. 4. End stage renal disease on hemodialysis: Management deferred to nephrology 5. Normocytic anemia: Likely secondary chronic disease. Stable. 6. History of coronary artery disease: Remains asymptomatic. 7. History of left ventricular ejection fraction 35% by 2-D echo 12/2006. Remains asymptomatic. 8. Gout   Increase Lantus. Likely resume full dose tomorrow.  Continue metoprolol, amiodarone and diltiazem. Now that source control has been achieved may need to discontinue diltiazem becomes bradycardic.  Resume Lovenox when okay with surgery. Resume warfarin when okay with surgery.  Home when cleared by surgery  Pending studies:   none  Code Status: full code DVT prophylaxis: Lovenox Family Communication: None present Disposition Plan: pending further evaluation  Brendia Sacks, MD  [facility]  Pager [phone] If 7PM-7AM, please contact night-coverage at [email or web address], password [facility] 07/14/2013, 6:20 PM  LOS: 7 days   Summary: 62 year old man with history of end-stage renal disease, diabetes mellitus, mitral valve replacement with tissue valve, status post CABG, atrial fibrillation maintained on warfarin who presented to the emergency department with a history of having had his nail, developed pain, swelling and redness. X-ray suggested osteomyelitis and patient was admitted  for further evaluation. Shortly after hospitalization he developed atrial fibrillation with rapid ventricular response.  Consultants:  Nephrology  General surgery  Procedures:  Right great toe amputation 11/17  Antibiotics:  Vancomycin 11/10 >>   Zosyn 11/10 >>   HPI/Subjective: Had surgery this morning. No complaints at this point. He feels well.  Objective: Filed Vitals:   07/14/13 1045 07/14/13 1100 07/14/13 1115 07/14/13 1611  BP:  107/54    Pulse: 108 108 76   Temp:   97.4 F (36.3 C) 98.4 F (36.9 C)  TempSrc:   Oral Oral  Resp: 17 24 23    Height:      Weight:      SpO2: 84% 84% 97%     Intake/Output Summary (Last 24 hours) at 07/14/13 1820 Last data filed at 07/14/13 1300  Gross per 24 hour  Intake   1000 ml  Output      0 ml  Net   1000 ml     Filed Weights   07/12/13 1440 07/14/13 0500 07/14/13 0930  Weight: 103.2 kg (227 lb 8.2 oz) 103.7 kg (228 lb 9.9 oz) 109.4 kg (241 lb 2.9 oz)    Exam:   Afebrile, vitals overall stable. Heart rate relatively well controlled overall.  Cardiovascular: Irregular, normal rate. No murmur, rub or gallop. No lower extremity edema.  General: Appears calm and comfortable. Speech fluent and clear.  Respiratory: Clear to auscultation bilaterally. No wheezes, rales or rhonchi. Normal respiratory effort.  Psychiatric: Grossly normal mood and affect. Speech fluent and appropriate.  Data Reviewed:  Basic metabolic panel consistent with end-stage renal disease. Potassium is normal.  Capillary blood sugars stable.  WBC decreased, hemoglobin stable at 10.0.  INR 1.60.  Scheduled Meds: . allopurinol  100 mg Oral Daily  . amiodarone  100 mg Oral BID  . aspirin  81 mg Oral Daily  .  atorvastatin  20 mg Oral q1800  . diltiazem  60 mg Oral Q6H  . docusate sodium  100 mg Oral BID  . epoetin alfa  10,000 Units Intravenous Q T,Th,Sa-HD  . folic acid  1 mg Oral Daily  . insulin aspart  0-5 Units Subcutaneous QHS  .  insulin aspart  0-9 Units Subcutaneous TID WC  . insulin glargine  17 Units Subcutaneous QHS  . lubiprostone  8 mcg Oral BID WC  . metoprolol tartrate  100 mg Oral BID  . pantoprazole  40 mg Oral BID  . piperacillin-tazobactam (ZOSYN)  IV  2.25 g Intravenous Q8H  . sevelamer carbonate  800 mg Oral TID WC  . sodium chloride  3 mL Intravenous Q12H  . vancomycin  1,000 mg Intravenous Q T,Th,Sa-HD   Continuous Infusions:    Principal Problem:   Acute osteomyelitis of toe of right foot Active Problems:   Anemia, chronic disease   Screening for colon cancer   Atrial fibrillation   Gorman term (current) use of anticoagulants   CAD (coronary artery disease)   S/P CABG (coronary artery bypass graft)   H/O mitral valve replacement   ESRD (end stage renal disease) on dialysis   DM type 2 causing ESRD   Atrial fibrillation with RVR   Time spent 20 minutes

## 2013-07-14 NOTE — Progress Notes (Signed)
PT HAS BEEN TAKEN TO OR FOR RT GREAT TOE AMPUTATION.

## 2013-07-14 NOTE — Anesthesia Preprocedure Evaluation (Addendum)
Anesthesia Evaluation  Patient identified by MRN, date of birth, ID band Patient awake    Reviewed: Allergy & Precautions, H&P , NPO status , Patient's Chart, lab work & pertinent test results, reviewed documented beta blocker date and time   Airway Mallampati: II TM Distance: >3 FB Neck ROM: Full    Dental  (+) Partial Upper and Partial Lower   Pulmonary former smoker,  breath sounds clear to auscultation        Cardiovascular hypertension, + CAD, + Past MI and + CABG + dysrhythmias Atrial Fibrillation + Valvular Problems/Murmurs (MVR) MR Rhythm:Irregular Rate:Normal     Neuro/Psych    GI/Hepatic GERD-  Controlled and Medicated,  Endo/Other  diabetes, Type 2  Renal/GU ESRFRenal disease     Musculoskeletal   Abdominal   Peds  Hematology  (+) Blood dyscrasia, anemia ,   Anesthesia Other Findings   Reproductive/Obstetrics                          Anesthesia Physical Anesthesia Plan  ASA: IV  Anesthesia Plan: MAC   Post-op Pain Management:    Induction: Intravenous  Airway Management Planned: Simple Face Mask  Additional Equipment:   Intra-op Plan:   Post-operative Plan:   Informed Consent: I have reviewed the patients History and Physical, chart, labs and discussed the procedure including the risks, benefits and alternatives for the proposed anesthesia with the patient or authorized representative who has indicated his/her understanding and acceptance.     Plan Discussed with:   Anesthesia Plan Comments:         Anesthesia Quick Evaluation

## 2013-07-14 NOTE — Op Note (Signed)
Patient:  [name] [name]  DOB:  04-02-1951  MRN:  [id number]   Preop Diagnosis:  Gangrene, right great toe  Postop Diagnosis:  Same  Procedure:  Right great toe amputation  Surgeon:  Franky Macho, M.D.  Anes:  MAC  Indications:  Patient is a 62 year old black male multiple medical problems including end-stage renal disease and peripheral vascular disease secondary to diabetes mellitus who has developed gangrene of the right great toe. The risks and benefits of the procedure were fully explained to the patient, who gave informed consent.  Procedure note:  The patient was placed the supine position. After monitored anesthesia care was given, the right foot was prepped and draped using usual sterile technique with Betadine. Surgical site confirmation was performed.  The base of the toe was blocked using 1% Xylocaine. An ovoid incision was made around the base of the right great toe. The dissection was taken down to the joint where the toe was disarticulated and removed. Any remaining necrotic tissue was debrided sharply using a knife and scissors. A bleeding was controlled using Bovie electrocautery. The wound could not be closed primarily, thus a wound VAC was placed. A good seal was obtained.  All tape and needle counts were correct at the end of the procedure. Patient was transferred to PACU in stable condition.  Complications:  None  EBL:  Minimal  Specimen:  Right great toe

## 2013-07-15 LAB — BASIC METABOLIC PANEL
Calcium: 10.3 mg/dL (ref 8.4–10.5)
Chloride: 95 mEq/L — ABNORMAL LOW (ref 96–112)
GFR calc non Af Amer: 4 mL/min — ABNORMAL LOW (ref >90)
Glucose, Bld: 96 mg/dL (ref 70–99)
Potassium: 4.2 mEq/L (ref 3.5–5.1)
Sodium: 136 mEq/L (ref 135–145)

## 2013-07-15 LAB — GLUCOSE, CAPILLARY
Glucose-Capillary: 139 mg/dL — ABNORMAL HIGH (ref 70–99)
Glucose-Capillary: 118 mg/dL — ABNORMAL HIGH (ref 70–99)

## 2013-07-15 LAB — CBC
MCH: 31.3 pg (ref 26.0–34.0)
MCHC: 33.1 g/dL (ref 30.0–36.0)
Platelets: 263 10*3/uL (ref 150–400)
RDW: 14.6 % (ref 11.5–15.5)

## 2013-07-15 LAB — PROTIME-INR: Prothrombin Time: 19.9 seconds — ABNORMAL HIGH (ref 11.6–15.2)

## 2013-07-15 LAB — PHOSPHORUS: Phosphorus: 5.7 mg/dL — ABNORMAL HIGH (ref 2.3–4.6)

## 2013-07-15 MED ORDER — NEPRO/CARBSTEADY PO LIQD: 237.0000 mL | ORAL | Status: DC | PRN

## 2013-07-15 MED ORDER — HEPARIN SODIUM (PORCINE) 5000 UNIT/ML IJ SOLN
5000.0000 [IU] | Freq: Three times a day (TID) | INTRAMUSCULAR | Status: DC
  Administered 2013-07-15 – 2013-07-18 (×9): 5000 [IU] via SUBCUTANEOUS
  Filled 2013-07-15 (×9): qty 1

## 2013-07-15 MED ORDER — HEPARIN SODIUM (PORCINE) 1000 UNIT/ML DIALYSIS
1000.0000 [IU] | INTRAMUSCULAR | Status: DC | PRN
  Filled 2013-07-15: qty 1

## 2013-07-15 MED ORDER — WARFARIN - PHARMACIST DOSING INPATIENT
Status: DC
  Administered 2013-07-15 – 2013-07-18 (×4)

## 2013-07-15 MED ORDER — WARFARIN SODIUM 2 MG PO TABS
2.0000 mg | ORAL_TABLET | Freq: Once | ORAL | Status: AC
  Administered 2013-07-15: 2 mg via ORAL
  Filled 2013-07-15: qty 1

## 2013-07-15 MED ORDER — DILTIAZEM HCL 30 MG PO TABS
30.0000 mg | ORAL_TABLET | Freq: Four times a day (QID) | ORAL | Status: DC
  Administered 2013-07-15 – 2013-07-17 (×9): 30 mg via ORAL
  Filled 2013-07-15 (×9): qty 1

## 2013-07-15 MED ORDER — SODIUM CHLORIDE 0.9 % IV SOLN: 100.0000 mL | INTRAVENOUS | Status: DC | PRN

## 2013-07-15 MED ORDER — LIDOCAINE-PRILOCAINE 2.5-2.5 % EX CREA
1.0000 "application " | TOPICAL_CREAM | CUTANEOUS | Status: DC | PRN
  Filled 2013-07-15: qty 5

## 2013-07-15 MED ORDER — PENTAFLUOROPROP-TETRAFLUOROETH EX AERO
1.0000 "application " | INHALATION_SPRAY | CUTANEOUS | Status: DC | PRN
  Filled 2013-07-15: qty 103.5

## 2013-07-15 MED ORDER — LIDOCAINE HCL (PF) 1 % IJ SOLN: 5.0000 mL | INTRAMUSCULAR | Status: DC | PRN

## 2013-07-15 MED ORDER — ALTEPLASE 2 MG IJ SOLR
2.0000 mg | Freq: Once | INTRAMUSCULAR | Status: AC | PRN
  Filled 2013-07-15: qty 2

## 2013-07-15 MED ORDER — PRO-STAT SUGAR FREE PO LIQD
30.0000 mL | Freq: Two times a day (BID) | ORAL | Status: DC
  Administered 2013-07-15 – 2013-07-18 (×6): 30 mL via ORAL
  Filled 2013-07-15 (×6): qty 30

## 2013-07-15 NOTE — Progress Notes (Signed)
[facility] PROGRESS NOTE  [name] [name]:[id number] DOB: 06/23/1951 DOA: 07/07/2013 PCP: Cassell Smiles., MD Followed by Dr. Alanda Amass, with cardiology. Followed by Dr. Kristian Covey with nephrology. He gets dialyzed Tuesday, Thursdays, and Saturdays  Summary: 62 year old man with history of end-stage renal disease, diabetes mellitus, mitral valve replacement with tissue valve, status post CABG, atrial fibrillation maintained on warfarin who presented to the emergency department with a history of having had his nail, developed pain, swelling and redness. X-ray suggested osteomyelitis and patient was admitted for further evaluation. Shortly after hospitalization he developed atrial fibrillation with rapid ventricular response. He developed gangrene of the right toe and required amputation which was successfully accomplished. Atrial fibrillation control has been better postoperatively. He will continue on IV antibiotics for 2 weeks per infectious disease. Rate control medications will be adjusted. Likely home in a few days when cleared by surgery.  Assessment/Plan: 1. Osteomyelitis of the right great toe with associated sepsis and developing gangrene: Now status post amputation 11/17. Continue empiric antibiotics for 2 weeks per infectious disease Dr. Daiva Eves. 2. Atrial fibrillation with rapid ventricular response: Control improved. Continue metoprolol. Decrease diltiazem dosing. Now that amputation accomplished expect that will be less need for pharmacologic rate control. May need to discontinue diltiazem altogether. 3. Type 2 diabetes mellitus: Well controlled. Continue Lantus and sliding scale. 4. End stage renal disease on hemodialysis: Management deferred to nephrology 5. Normocytic anemia: Likely secondary chronic disease. Widely depressed. Repeat CBC in AM. 6. History of coronary artery disease: Remains asymptomatic. 7. History of left ventricular ejection fraction 35% by 2-D echo 12/2006.  Remains asymptomatic. 8. Gout   Start anticoagulation per surgery  Continue antibiotics per 2 weeks per infectious disease  CBC in a.m.  Home soon  Pending studies:   none  Code Status: full code DVT prophylaxis: Lovenox Family Communication: None present Disposition Plan: pending further evaluation  Brendia Sacks, MD  [facility]  Pager [phone] If 7PM-7AM, please contact night-coverage at [email or web address], password [facility] 07/15/2013, 10:25 AM  LOS: 8 days   Consultants:  Nephrology  General surgery  Procedures:  Right great toe amputation 11/17  Antibiotics:  Vancomycin 11/10 >>   Zosyn 11/10 >>   HPI/Subjective: No complaints. Feeling fine.  Objective: Filed Vitals:   07/15/13 0700 07/15/13 0715 07/15/13 0800 07/15/13 0900  BP: 122/87  122/74 119/69  Pulse: 47  64 47  Temp:  97.7 F (36.5 C)    TempSrc:  Oral    Resp: 20  18 20   Height:      Weight:      SpO2: 94%  93% 89%    Intake/Output Summary (Last 24 hours) at 07/15/13 1025 Last data filed at 07/15/13 0900  Gross per 24 hour  Intake   1040 ml  Output    204 ml  Net    836 ml     Filed Weights   07/12/13 1440 07/14/13 0500 07/14/13 0930  Weight: 103.2 kg (227 lb 8.2 oz) 103.7 kg (228 lb 9.9 oz) 109.4 kg (241 lb 2.9 oz)    Exam:   Afebrile, vitals overall stable. Her rate control.  General: Appears calm and comfortable. Speech fluent and clear.  Cardiovascular: Regular rate and rhythm. No murmur, rub or gallop. No lower extremity edema.  Respiratory: Clear to auscultation bilaterally. No wheezes, rales or rhonchi. Normal respiratory effort.  Right foot with wound VAC in place.  Psychiatric: Grossly normal mood and affect. Speech fluent and appropriate.  Data Reviewed:  Basic  metabolic panel consistent with end-stage renal disease. Potassium is normal.  Capillary blood sugars stable.  WBC increased 15.2. Hemoglobin slightly decreased.  INR 1.75  Scheduled  Meds: . allopurinol  100 mg Oral Daily  . amiodarone  100 mg Oral BID  . aspirin  81 mg Oral Daily  . atorvastatin  20 mg Oral q1800  . diltiazem  60 mg Oral Q6H  . docusate sodium  100 mg Oral BID  . epoetin alfa  10,000 Units Intravenous Q T,Th,Sa-HD  . folic acid  1 mg Oral Daily  . insulin aspart  0-5 Units Subcutaneous QHS  . insulin aspart  0-9 Units Subcutaneous TID WC  . insulin glargine  24 Units Subcutaneous QHS  . lubiprostone  8 mcg Oral BID WC  . metoprolol tartrate  100 mg Oral BID  . pantoprazole  40 mg Oral BID  . piperacillin-tazobactam (ZOSYN)  IV  2.25 g Intravenous Q8H  . sevelamer carbonate  800 mg Oral TID WC  . sodium chloride  3 mL Intravenous Q12H  . vancomycin  1,000 mg Intravenous Q T,Th,Sa-HD   Continuous Infusions:    Principal Problem:   Acute osteomyelitis of toe of right foot Active Problems:   Anemia, chronic disease   Screening for colon cancer   Atrial fibrillation   [place] term (current) use of anticoagulants   CAD (coronary artery disease)   S/P CABG (coronary artery bypass graft)   H/O mitral valve replacement   ESRD (end stage renal disease) on dialysis   DM type 2 causing ESRD   Atrial fibrillation with RVR   Time spent 15 minutes

## 2013-07-15 NOTE — Progress Notes (Addendum)
[name] [name]  MRN: [id number]  DOB/AGE: [birth year] 64 y.o.  Primary Care Physician:FUSCO,LAWRENCE J., MD  Admit date: 07/07/2013  Chief Complaint:  Chief Complaint  Patient presents with  . Toe Pain    S-Pt presented on  07/07/2013 with  Chief Complaint  Patient presents with  . Toe Pain  .    Pt today offers no new complaints .    Pt says " I am now feeling better"  Meds . allopurinol  100 mg Oral Daily  . amiodarone  100 mg Oral BID  . aspirin  81 mg Oral Daily  . atorvastatin  20 mg Oral q1800  . diltiazem  60 mg Oral Q6H  . docusate sodium  100 mg Oral BID  . epoetin alfa  10,000 Units Intravenous Q T,Th,Sa-HD  . folic acid  1 mg Oral Daily  . insulin aspart  0-5 Units Subcutaneous QHS  . insulin aspart  0-9 Units Subcutaneous TID WC  . insulin glargine  24 Units Subcutaneous QHS  . lubiprostone  8 mcg Oral BID WC  . metoprolol tartrate  100 mg Oral BID  . pantoprazole  40 mg Oral BID  . piperacillin-tazobactam (ZOSYN)  IV  2.25 g Intravenous Q8H  . sevelamer carbonate  800 mg Oral TID WC  . sodium chloride  3 mL Intravenous Q12H  . vancomycin  1,000 mg Intravenous Q T,Th,Sa-HD      Physical Exam: Vital signs in last 24 hours: Temp:  [97.4 F (36.3 C)-99.2 F (37.3 C)] 97.7 F (36.5 C) (11/18 0715) Pulse Rate:  [26-108] 64 (11/18 0800) Resp:  [14-29] 18 (11/18 0800) BP: (75-123)/(35-90) 122/74 mmHg (11/18 0800) SpO2:  [80 %-100 %] 93 % (11/18 0800) Weight:  [241 lb 2.9 oz (109.4 kg)] 241 lb 2.9 oz (109.4 kg) (11/17 0930) Weight change: 12 lb 9.1 oz (5.7 kg) Last BM Date: 07/14/13  Intake/Output from previous day: 11/17 0701 - 11/18 0700 In: 1515 [P.O.:840; I.V.:525; IV Piggyback:150] Out: 204 [Urine:200; Stool:4] Total I/O In: 280 [P.O.:240; I.V.:40] Out: -    Physical Exam: General- pt is awake,alert, oriented to time place and person Resp- No acute REsp distress, CTA B/L NO Rhonchi CVS- S1S2 Irregular in rate and rhythm GIT- BS+, soft, NT,  ND EXT- NO LE Edema, Right toe amputated, Wound vac in situ  Access -Right AVF +  Lab Results: CBC  Recent Labs  07/14/13 1107 07/15/13 0434  WBC 13.2* 15.2*  HGB 10.0* 9.2*  HCT 30.6* 27.8*  PLT 254 263    BMET  Recent Labs  07/14/13 1107 07/15/13 0434  NA 134* 136  K 4.2 4.2  CL 93* 95*  CO2 26 24  GLUCOSE 213* 96  BUN 44* 53*  CREATININE 8.93* 10.75*  CALCIUM 10.5 10.3    MICRO Recent Results (from the past 240 hour(s))  CULTURE, BLOOD (ROUTINE X 2)     Status: None   Collection Time    07/07/13  8:20 PM      Result Value Range Status   Specimen Description BLOOD LEFT ANTECUBITAL   Final   Special Requests BOTTLES DRAWN AEROBIC AND ANAEROBIC 10CC EACH   Final   Culture NO GROWTH 5 DAYS   Final   Report Status 07/12/2013 FINAL   Final  CULTURE, BLOOD (ROUTINE X 2)     Status: None   Collection Time    07/07/13  8:25 PM      Result Value Range Status   Specimen Description BLOOD  LEFT WRIST   Final   Special Requests BOTTLES DRAWN AEROBIC AND ANAEROBIC 8CC EACH   Final   Culture NO GROWTH 5 DAYS   Final   Report Status 07/12/2013 FINAL   Final  MRSA PCR SCREENING     Status: None   Collection Time    07/08/13  7:00 PM      Result Value Range Status   MRSA by PCR NEGATIVE  NEGATIVE Final   Comment:            The GeneXpert MRSA Assay (FDA     approved for NASAL specimens     only), is one component of a     comprehensive MRSA colonization     surveillance program. It is not     intended to diagnose MRSA     infection nor to guide or     monitor treatment for     MRSA infections.      Lab Results  Component Value Date   CALCIUM 10.3 07/15/2013   PHOS 5.7* 07/15/2013         Impression: 1)Renal  ESRD on HD                ON TTS schedule                 Pt is to be Dialyzed today    2)HTN  BP on lower side ( stable) Target Organ damage  CKD CAD CHF Medication- On Beta blockers and Calcium channel blockers    3)Anemia HGb at  goal (9--11) On Epo  4)CKD Mineral-Bone Disorder Phosphorus at goal. Calcium at goal.  5)ID-admitted with Osteomyelitis S/p amputation  6)Electrolytes Normokalemic NOrmonatremic   7)Acid base Co2 at goal  8) Afib- On Coumadin and Amiodarone     Plan:  Will dialyze today  Will continue curent tx   Addendum Pt seen on HD Pt tolerating tx well.    Elonna Mcfarlane S 07/15/2013, 9:07 AM

## 2013-07-15 NOTE — Progress Notes (Signed)
UR Chart Review Completed  

## 2013-07-15 NOTE — Progress Notes (Signed)
PT TRANSFERING TO ROOM 340 AFTER DIALYSIS. TRANSFER REPORT CALLED TO RN IN 300. SHE WAS INFORMED OF DELAY FOR TRANSFER. PT ALERT AND ORIENTED. LT UPPER ARM IV PATENT. PT IS OLGURIC. Marland Kitchen AV SHUNTS IN RT LOWER ARMS ARE PATENT. PT HAS WOUND VAC TO SITE IF RT GREAT TOE AMPUTATION.SEAL IS INTACT. DRAINAGE IS SEROSANGUINUS FLUID IN MINIMAL. AMOUNT. PT DENIES ANY PAIN.

## 2013-07-15 NOTE — Progress Notes (Signed)
ANTIBIOTIC CONSULT NOTE  Pharmacy Consult for Vancomycin and Zosyn Indication: cellulitis, osteomyelitis  Allergies  Allergen Reactions  . Norvasc [Amlodipine Besylate]    Patient Measurements: Height: 6\' 4"  (193 cm) Weight: 241 lb 2.9 oz (109.4 kg) IBW/kg (Calculated) : 86.8  Vital Signs: Temp: 97.7 F (36.5 C) (11/18 0715) Temp src: Oral (11/18 0715) BP: 110/58 mmHg (11/18 0600) Pulse Rate: 58 (11/18 0600) Intake/Output from previous day: 11/17 0701 - 11/18 0700 In: 1445 [P.O.:840; I.V.:505; IV Piggyback:100] Out: 204 [Urine:200; Stool:4] Intake/Output from this shift:    Labs:  Recent Labs  07/14/13 0457 07/14/13 1107 07/15/13 0434  WBC  --  13.2* 15.2*  HGB  --  10.0* 9.2*  PLT  --  254 263  CREATININE 8.82* 8.93* 10.75*    Recent Labs  07/15/13 0434  VANCORANDOM 17.9    Microbiology: Recent Results (from the past 720 hour(s))  CULTURE, BLOOD (ROUTINE X 2)     Status: None   Collection Time    07/07/13  8:20 PM      Result Value Range Status   Specimen Description BLOOD LEFT ANTECUBITAL   Final   Special Requests BOTTLES DRAWN AEROBIC AND ANAEROBIC 10CC EACH   Final   Culture NO GROWTH 5 DAYS   Final   Report Status 07/12/2013 FINAL   Final  CULTURE, BLOOD (ROUTINE X 2)     Status: None   Collection Time    07/07/13  8:25 PM      Result Value Range Status   Specimen Description BLOOD LEFT WRIST   Final   Special Requests BOTTLES DRAWN AEROBIC AND ANAEROBIC 8CC EACH   Final   Culture NO GROWTH 5 DAYS   Final   Report Status 07/12/2013 FINAL   Final  MRSA PCR SCREENING     Status: None   Collection Time    07/08/13  7:00 PM      Result Value Range Status   MRSA by PCR NEGATIVE  NEGATIVE Final   Comment:            The GeneXpert MRSA Assay (FDA     approved for NASAL specimens     only), is one component of a     comprehensive MRSA colonization     surveillance program. It is not     intended to diagnose MRSA     infection nor to guide or     monitor treatment for     MRSA infections.   Medical History: Past Medical History  Diagnosis Date  . UGI bleed 02/12/11    on anticoagulation; EGD w/snare polypectomy-multiple polypoid lesions antrum(benign), Chronic active gastritis (NEGATIVE H pylori)  . Anemia, chronic disease   . Sepsis due to enterococcus 02/11/11  . Atrial fibrillation     coumadin  . CAD (coronary artery disease)     s/p CABGx4  . Cardiomyopathy     40-45% EF 6/12  . Diabetes mellitus   . Hypertension   . Hyperlipemia   . Gout   . S/P patent foramen ovale closure   . History of nuclear stress test 11/2011    negative myoview  . End stage kidney disease     T/TH/SAT dialyis, Dr Fausto Skillern  . Gout   . History of nuclear stress test 07/04/2010    dipyridamole; mod fixed inferolateral defect; non-diagnostic for ischemia; low risk scan    Medications:  Scheduled:  . allopurinol  100 mg Oral Daily  . amiodarone  100 mg  Oral BID  . aspirin  81 mg Oral Daily  . atorvastatin  20 mg Oral q1800  . diltiazem  60 mg Oral Q6H  . docusate sodium  100 mg Oral BID  . epoetin alfa  10,000 Units Intravenous Q T,Th,Sa-HD  . folic acid  1 mg Oral Daily  . insulin aspart  0-5 Units Subcutaneous QHS  . insulin aspart  0-9 Units Subcutaneous TID WC  . insulin glargine  24 Units Subcutaneous QHS  . lubiprostone  8 mcg Oral BID WC  . metoprolol tartrate  100 mg Oral BID  . pantoprazole  40 mg Oral BID  . piperacillin-tazobactam (ZOSYN)  IV  2.25 g Intravenous Q8H  . sevelamer carbonate  800 mg Oral TID WC  . sodium chloride  3 mL Intravenous Q12H  . vancomycin  1,000 mg Intravenous Q T,Th,Sa-HD   Assessment: 62yo morbidly obese male with ESRD requiring dialysis admitted osteomyelitis right great toe.  Pre-dialysis level today is on target.  Vancomycin 11/10 >> Zosyn 11/10 >>  Goal of Therapy:  Pre-Hemodialysis Vancomycin level goal range =15-25 mcg/ml  Plan:  Continue Vancomycin 1gm IV every HD. Continue Zosyn  2.25gm IVq8h Monitor labs, renal fxn, and cultures Check pre-HD level weekly while on Vancomycin F/U plan for surgery, [place]-term antibiotics  Margo Aye, Romulo Okray A 07/15/2013,8:45 AM

## 2013-07-15 NOTE — Procedures (Signed)
   HEMODIALYSIS TREATMENT NOTE:  4 hour heparin-free dialysis completed via right forearm AVF (15g/antegrade).  Goal met:  Tolerated removal of 3 liters with no interruption in ultrafiltration and with no tachycardia.  All blood was reinfused and hemostasis was achieved within 10 minutes.  Report given to Billy Coast, RN.  Errin Chewning L. Dariela Stoker, RN, CDN

## 2013-07-15 NOTE — Progress Notes (Signed)
INITIAL NUTRITION ASSESSMENT  DOCUMENTATION CODES Per approved criteria     INTERVENTION:  Nepro per protocol   Add ProStat 30 ml BID between meals  NUTRITION DIAGNOSIS: Increased protein-energy needs related to ERSD with hemodialysis and osteomyelitis as evidenced by estimated nutrition requrements.   Goal: Pt to meet >/= 90% of their estimated nutrition needs   Monitor:  Protein-energy intake, I/O's, labs and wt trends  Reason for Assessment: Length of Stay   62 y.o. male  Admitting Dx: Acute osteomyelitis of toe of right foot  ASSESSMENT: pt s/p amputation of right great toe yesterday. Hx of ESRD on dialysis past 5-6 yrs. Appetite good (po 100% breakfast this morning). He reports dry wt 103 kg. Fairly stable wt hx. Pt has never supplemented his nutrition intake but is willing to given his increased needs which we discussed.  Patient Active Problem List   Diagnosis Date Noted  . Atrial fibrillation with RVR 07/08/2013  . CAD (coronary artery disease) 07/07/2013  . S/P CABG (coronary artery bypass graft) 07/07/2013  . H/O mitral valve replacement 07/07/2013  . ESRD (end stage renal disease) on dialysis 07/07/2013  . DM type 2 causing ESRD 07/07/2013  . Acute osteomyelitis of toe of right foot 07/07/2013  . Atrial fibrillation 12/13/2012  . [place] term (current) use of anticoagulants 12/13/2012  . GERD (gastroesophageal reflux disease) 09/23/2012  . 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 Chest pain 12/27/2011  . Constipation 12/27/2011  . Dysphagia 12/27/2011  . UGI bleed 05/26/2011  . Anemia, chronic disease 05/26/2011  . Screening for colon cancer 05/26/2011    Height: Ht Readings from Last 1 Encounters:  07/08/13 6\' 4"  (1.93 m)    Weight: Wt Readings from Last 1 Encounters:  07/14/13 241 lb 2.9 oz (109.4 kg)    Ideal Body Weight: 202# (91.8 kg)  % Ideal Body Weight: 119%  Wt Readings from Last 10 Encounters:  07/14/13 241 lb 2.9 oz (109.4 kg)   07/14/13 241 lb 2.9 oz (109.4 kg)  06/02/13 236 lb 6.4 oz (107.23 kg)  09/23/12 235 lb 6.4 oz (106.777 kg)  04/17/12 234 lb (106.142 kg)  12/27/11 240 lb (108.863 kg)  06/23/11 233 lb (105.688 kg)  06/23/11 233 lb (105.688 kg)  05/26/11 235 lb (106.595 kg)    Usual Body Weight: 235-240#  % Usual Body Weight: 100%  BMI:  Body mass index is 29.37 kg/(m^2).overweight  Estimated Nutritional Needs: Kcal: 2300-2760 Protein: 110-120 gr  Fluid: 1000 ml plus 24 hr urine output   Skin: s/p amputation of right great toe, wound vac in place  Diet Order: Renal  EDUCATION NEEDS: -Education needs addressed   Intake/Output Summary (Last 24 hours) at 07/15/13 1100 Last data filed at 07/15/13 1026  Gross per 24 hour  Intake   1020 ml  Output    206 ml  Net    814 ml    Last BM: 07/14/13 (loose, watery stool)   Labs:   Recent Labs Lab 07/10/13 0504  07/12/13 0533 07/14/13 0457 07/14/13 1107 07/15/13 0434  NA 137  < > 135 138 134* 136  K 4.2  < > 4.2 3.8 4.2 4.2  CL 95*  < > 94* 95* 93* 95*  CO2 26  < > 26 29 26 24   BUN 45*  < > 45* 41* 44* 53*  CREATININE 10.32*  < > 9.77* 8.82* 8.93* 10.75*  CALCIUM 10.4  < > 10.7* 10.4 10.5 10.3  PHOS 5.2*  --  4.5  --   --  5.7*  GLUCOSE 134*  < > 133* 116* 213* 96  < > = values in this interval not displayed.  CBG (last 3)   Recent Labs  07/14/13 1604 07/14/13 2205 07/15/13 0729  GLUCAP 169* 120* 79    Scheduled Meds: . allopurinol  100 mg Oral Daily  . amiodarone  100 mg Oral BID  . aspirin  81 mg Oral Daily  . atorvastatin  20 mg Oral q1800  . diltiazem  30 mg Oral Q6H  . docusate sodium  100 mg Oral BID  . epoetin alfa  10,000 Units Intravenous Q T,Th,Sa-HD  . folic acid  1 mg Oral Daily  . heparin subcutaneous  5,000 Units Subcutaneous Q8H  . insulin aspart  0-5 Units Subcutaneous QHS  . insulin aspart  0-9 Units Subcutaneous TID WC  . insulin glargine  24 Units Subcutaneous QHS  . lubiprostone  8 mcg Oral BID  WC  . metoprolol tartrate  100 mg Oral BID  . pantoprazole  40 mg Oral BID  . piperacillin-tazobactam (ZOSYN)  IV  2.25 g Intravenous Q8H  . sevelamer carbonate  800 mg Oral TID WC  . sodium chloride  3 mL Intravenous Q12H  . vancomycin  1,000 mg Intravenous Q T,Th,Sa-HD  . warfarin  2 mg Oral Once  . Warfarin - Pharmacist Dosing Inpatient   Does not apply Q24H    Continuous Infusions:   Past Medical History  Diagnosis Date  . UGI bleed 02/12/11    on anticoagulation; EGD w/snare polypectomy-multiple polypoid lesions antrum(benign), Chronic active gastritis (NEGATIVE H pylori)  . Anemia, chronic disease   . Sepsis due to enterococcus 02/11/11  . Atrial fibrillation     coumadin  . CAD (coronary artery disease)     s/p CABGx4  . Cardiomyopathy     40-45% EF 6/12  . Diabetes mellitus   . Hypertension   . Hyperlipemia   . Gout   . S/P patent foramen ovale closure   . History of nuclear stress test 11/2011    negative myoview  . End stage kidney disease     T/TH/SAT dialyis, Dr Fausto Skillern  . Gout   . History of nuclear stress test 07/04/2010    dipyridamole; mod fixed inferolateral defect; non-diagnostic for ischemia; low risk scan     Past Surgical History  Procedure Laterality Date  . Cardiac catheterization  12/31/2006    severe diffuse 3 vessel CAD, severe 4+ MR (Dr. Evlyn Courier)  . Mitral valve replacement  01/05/2007    bioprosthetic 31mm Edwards precordial tissue (Dr. Tyrone Sage)  . Coronary artery bypass graft  01/05/2007    x4; LIMA-LAD, SVG-OM, seq. SVG-PDA, PLA-RCA  . Colonoscopy  06/23/2011    Dr. Darrick Penna: multiple sessile and pedunculated polyps, moderate diverticulosis, internal hemorrhoids, +ADENOMATOUS POLYPS, consider genetic testing, surveillance in October 2015   . Esophagogastroduodenoscopy  02/12/2011    CHRONIC ACTIVE GASTRITIS, NO H. pylori  . Transthoracic echocardiogram  11/2011    EF 25% w/ mod dilatation of LV & mod LVH; LA severely dilated; mod-severe  dilation of RV with mod-severe decrease in RV function; mild MR & TR  . Cardioversion  04/19/2007    Dr. Jonette Eva  . Transthoracic echocardiogram  02/14/2011    EF 40-45%, mod conc LVH; ventricular septum with motion showing abnormal function, dyssynergy, paradox; mild AS; mild-mod MR stenosis; LA severely dilated; aptrial septum bowed from L to R with increased LA pressure     Royann Shivers MS,RD,CSG,LDN Office: #  [phone] Pager: [phone]

## 2013-07-15 NOTE — Progress Notes (Signed)
1 Day Post-Op  Subjective: No complaints of right foot pain.  Objective: Vital signs in last 24 hours: Temp:  [97.4 F (36.3 C)-99.2 F (37.3 C)] 97.7 F (36.5 C) (11/18 0715) Pulse Rate:  [26-108] 64 (11/18 0800) Resp:  [14-29] 18 (11/18 0800) BP: (75-123)/(35-90) 122/74 mmHg (11/18 0800) SpO2:  [80 %-100 %] 93 % (11/18 0800) Weight:  [109.4 kg (241 lb 2.9 oz)] 109.4 kg (241 lb 2.9 oz) (11/17 0930) Last BM Date: 07/14/13  Intake/Output from previous day: 11/17 0701 - 11/18 0700 In: 1515 [P.O.:840; I.V.:525; IV Piggyback:150] Out: 204 [Urine:200; Stool:4] Intake/Output this shift: Total I/O In: 280 [P.O.:240; I.V.:40] Out: -   Extremities: Wound VAC in place with serosanguineous drainage present, right great toe amputation site.  Lab Results:   Recent Labs  07/14/13 1107 07/15/13 0434  WBC 13.2* 15.2*  HGB 10.0* 9.2*  HCT 30.6* 27.8*  PLT 254 263   BMET  Recent Labs  07/14/13 1107 07/15/13 0434  NA 134* 136  K 4.2 4.2  CL 93* 95*  CO2 26 24  GLUCOSE 213* 96  BUN 44* 53*  CREATININE 8.93* 10.75*  CALCIUM 10.5 10.3   PT/INR  Recent Labs  07/14/13 0457 07/15/13 0434  LABPROT 18.6* 19.9*  INR 1.60* 1.75*    Studies/Results: No results found.  Anti-infectives: Anti-infectives   Start     Dose/Rate Route Frequency Ordered Stop   07/08/13 1200  vancomycin (VANCOCIN) IVPB 1000 mg/200 mL premix     1,000 mg 200 mL/hr over 60 Minutes Intravenous Every T-Th-Sa (Hemodialysis) 07/08/13 1010     07/08/13 0500  piperacillin-tazobactam (ZOSYN) IVPB 2.25 g     2.25 g 100 mL/hr over 30 Minutes Intravenous 3 times per day 07/07/13 2017     07/07/13 2130  vancomycin (VANCOCIN) IVPB 1000 mg/200 mL premix     1,000 mg 200 mL/hr over 60 Minutes Intravenous  Once 07/07/13 2012 07/08/13 0025   07/07/13 2030  vancomycin (VANCOCIN) IVPB 1000 mg/200 mL premix     1,000 mg 200 mL/hr over 60 Minutes Intravenous  Once 07/07/13 2012 07/07/13 2223   07/07/13 2030   piperacillin-tazobactam (ZOSYN) IVPB 3.375 g     3.375 g 100 mL/hr over 30 Minutes Intravenous  Once 07/07/13 2013 07/07/13 2153      Assessment/Plan: s/p Procedure(s): AMPUTATION DIGIT Impression: Stable from surgical standpoint, status post right great toe amputation with wound VAC placement. Plan: Will change sponge tomorrow. May start anticoagulation today per hospitalist.  LOS: 8 days    Josephus Harriger A 07/15/2013

## 2013-07-15 NOTE — Progress Notes (Signed)
ANTICOAGULATION CONSULT NOTE  Pharmacy Consult for Coumadin Indication: atrial fibrillation  Allergies  Allergen Reactions  . Norvasc [Amlodipine Besylate]    Patient Measurements: Height: 6\' 4"  (193 cm) Weight: 241 lb 2.9 oz (109.4 kg) IBW/kg (Calculated) : 86.8  Vital Signs: Temp: 97.7 F (36.5 C) (11/18 0715) Temp src: Oral (11/18 0715) BP: 119/69 mmHg (11/18 0900) Pulse Rate: 47 (11/18 0900)  Labs:  Recent Labs  07/13/13 0521 07/14/13 0457 07/14/13 1107 07/15/13 0434  HGB  --   --  10.0* 9.2*  HCT  --   --  30.6* 27.8*  PLT  --   --  254 263  LABPROT 20.9* 18.6*  --  19.9*  INR 1.86* 1.60*  --  1.75*  CREATININE  --  8.82* 8.93* 10.75*   Estimated Creatinine Clearance: 9.4 ml/min (by C-G formula based on Cr of 10.75).  Medical History: Past Medical History  Diagnosis Date  . UGI bleed 02/12/11    on anticoagulation; EGD w/snare polypectomy-multiple polypoid lesions antrum(benign), Chronic active gastritis (NEGATIVE H pylori)  . Anemia, chronic disease   . Sepsis due to enterococcus 02/11/11  . Atrial fibrillation     coumadin  . CAD (coronary artery disease)     s/p CABGx4  . Cardiomyopathy     40-45% EF 6/12  . Diabetes mellitus   . Hypertension   . Hyperlipemia   . Gout   . S/P patent foramen ovale closure   . History of nuclear stress test 11/2011    negative myoview  . End stage kidney disease     T/TH/SAT dialyis, Dr Fausto Skillern  . Gout   . History of nuclear stress test 07/04/2010    dipyridamole; mod fixed inferolateral defect; non-diagnostic for ischemia; low risk scan    Medications:  Prescriptions prior to admission  Medication Sig Dispense Refill  . allopurinol (ZYLOPRIM) 100 MG tablet Take 100 mg by mouth daily.       Marland Kitchen amiodarone (PACERONE) 200 MG tablet Take 100 mg by mouth daily.       Marland Kitchen aspirin 81 MG tablet Take 81 mg by mouth daily.        Marland Kitchen atorvastatin (LIPITOR) 20 MG tablet Take 1 tablet (20 mg total) by mouth daily.  30 tablet   11  . B Complex-C-Zn-Folic Acid (DIALYVITE/ZINC PO) Take by mouth 3 (three) times a week.       . folic acid (FOLVITE) 1 MG tablet Take 1 mg by mouth daily.        Marland Kitchen glimepiride (AMARYL) 4 MG tablet Take 4 mg by mouth 2 (two) times daily.        Marland Kitchen LANTUS SOLOSTAR 100 UNIT/ML injection Inject 38 Units into the skin at bedtime.       Marland Kitchen lubiprostone (AMITIZA) 8 MCG capsule Take 1 capsule (8 mcg total) by mouth 2 (two) times daily with a meal.  60 capsule  3  . metoprolol (LOPRESSOR) 50 MG tablet Take 50 mg by mouth 2 (two) times daily.       . Multiple Vitamin (MULTIVITAMIN) capsule Take 1 capsule by mouth daily.      . pantoprazole (PROTONIX) 40 MG tablet Take 40 mg by mouth 2 (two) times daily.       Marland Kitchen RENVELA 800 MG tablet Take 800 mg by mouth 3 (three) times daily with meals.       . torsemide (DEMADEX) 20 MG tablet Take 20 mg by mouth 2 (two) times daily.      Marland Kitchen  traZODone (DESYREL) 50 MG tablet Take 50 mg by mouth at bedtime.      Marland Kitchen warfarin (COUMADIN) 2.5 MG tablet Take 1.25 mg by mouth daily.      . digoxin (LANOXIN) 0.125 MG tablet Take 0.125 mg by mouth daily.       Assessment: 62yo male on chronic Coumadin PTA for h/o afib.  Home dose clarified as 1.25mg  daily. Pt has not received any Coumadin since 11/11 due to elevated INR and plans for surgery (amputation).  OK to resume Coumadin today per surgery.  Pt is also on amiodarone and aspirin.  Goal of Therapy:  INR 2-3   Plan:  Coumadin 2mg  po today x 1 INR daily  Jessie Schrieber A 07/15/2013,10:47 AM

## 2013-07-16 ENCOUNTER — Encounter (HOSPITAL_COMMUNITY): Payer: Self-pay | Admitting: General Surgery

## 2013-07-16 ENCOUNTER — Ambulatory Visit: Payer: Medicare Other | Admitting: Internal Medicine

## 2013-07-16 LAB — GLUCOSE, CAPILLARY
Glucose-Capillary: 128 mg/dL — ABNORMAL HIGH (ref 70–99)
Glucose-Capillary: 193 mg/dL — ABNORMAL HIGH (ref 70–99)
Glucose-Capillary: 163 mg/dL — ABNORMAL HIGH (ref 70–99)
Glucose-Capillary: 188 mg/dL — ABNORMAL HIGH (ref 70–99)

## 2013-07-16 LAB — BASIC METABOLIC PANEL
BUN: 33 mg/dL — ABNORMAL HIGH (ref 6–23)
Calcium: 10.1 mg/dL (ref 8.4–10.5)
Chloride: 96 mEq/L (ref 96–112)
GFR calc Af Amer: 8 mL/min — ABNORMAL LOW (ref >90)
Potassium: 4.3 mEq/L (ref 3.5–5.1)
Sodium: 136 mEq/L (ref 135–145)

## 2013-07-16 LAB — CBC
HCT: 29.1 % — ABNORMAL LOW (ref 39.0–52.0)
MCH: 31.3 pg (ref 26.0–34.0)
MCHC: 32.6 g/dL (ref 30.0–36.0)
Platelets: 258 10*3/uL (ref 150–400)
RDW: 14.8 % (ref 11.5–15.5)
WBC: 12.5 10*3/uL — ABNORMAL HIGH (ref 4.0–10.5)

## 2013-07-16 LAB — PROTIME-INR: INR: 1.59 — ABNORMAL HIGH (ref 0.00–1.49)

## 2013-07-16 MED ORDER — WARFARIN SODIUM 2.5 MG PO TABS
2.5000 mg | ORAL_TABLET | Freq: Once | ORAL | Status: AC
  Administered 2013-07-16: 2.5 mg via ORAL
  Filled 2013-07-16: qty 1

## 2013-07-16 NOTE — Progress Notes (Signed)
[facility] PROGRESS NOTE  [name] [name] ZOX:[id number] DOB: [birth year] DOA: 07/07/2013 PCP: Cassell Smiles., MD Followed by Dr. Alanda Amass, with cardiology. Followed by Dr. Kristian Covey with nephrology. He gets dialyzed Tuesday, Thursdays, and Saturdays  Summary: 62 year old man with history of end-stage renal disease, diabetes mellitus, mitral valve replacement with tissue valve, status post CABG, atrial fibrillation maintained on warfarin who presented to the emergency department with a history of having had his nail, developed pain, swelling and redness. X-ray suggested osteomyelitis and patient was admitted for further evaluation. Shortly after hospitalization he developed atrial fibrillation with rapid ventricular response. He developed gangrene of the right toe and required amputation which was successfully accomplished. Atrial fibrillation control has been better postoperatively. He will continue on IV antibiotics for 2 weeks per infectious disease. Rate control medications will be adjusted. Anticipate discharge home in next 24 hours if heart rate remains stable.  Assessment/Plan: 1. Osteomyelitis of the right great toe with associated sepsis and developing gangrene: Now status post amputation 11/17. Continue empiric antibiotics for 2 weeks per infectious disease Dr. Daiva Eves. Will likely discharge on vancomycin and ceftazidime so that they may be administered with dialysis. 2. Atrial fibrillation with rapid ventricular response: Control improved. Continue metoprolol. Decrease diltiazem dosing. Now that amputation accomplished expect that will be less need for pharmacologic rate control. May need to discontinue diltiazem altogether. Continue to monitor on telemetry for tachycardias 3. Type 2 diabetes mellitus: Well controlled. Continue Lantus and sliding scale. 4. End stage renal disease on hemodialysis: Management deferred to nephrology, next dialysis in am. 5. Normocytic anemia: Likely  secondary chronic disease. stable 6. History of coronary artery disease: Remains asymptomatic. 7. History of left ventricular ejection fraction 35% by 2-D echo 12/2006. Remains asymptomatic. 8. Gout  Pending studies:   none  Code Status: full code DVT prophylaxis: Lovenox Family Communication: None present Disposition Plan: pending further evaluation  Erick Blinks, MD  [facility]  Pager [phone] If 7PM-7AM, please contact night-coverage at [email or web address], password [facility] 07/16/2013, 4:40 PM  LOS: 9 days   Consultants:  Nephrology  General surgery  Procedures:  Right great toe amputation 11/17  Antibiotics:  Vancomycin 11/10 >>   Zosyn 11/10 >>   HPI/Subjective: No complaints. Feeling fine.  Objective: Filed Vitals:   07/15/13 1700 07/15/13 2113 07/16/13 0532 07/16/13 1400  BP: 101/55 131/102 135/68 113/62  Pulse: 93 99 95 73  Temp:  98.6 F (37 C) 98.8 F (37.1 C) 98.1 F (36.7 C)  TempSrc:  Oral Oral Oral  Resp: 27 22 22 22   Height:      Weight:   107.8 kg (237 lb 10.5 oz)   SpO2: 98% 100% 93% 95%    Intake/Output Summary (Last 24 hours) at 07/16/13 1640 Last data filed at 07/16/13 0100  Gross per 24 hour  Intake     50 ml  Output      2 ml  Net     48 ml     Filed Weights   07/15/13 1215 07/15/13 1640 07/16/13 0532  Weight: 110.6 kg (243 lb 13.3 oz) 108.2 kg (238 lb 8.6 oz) 107.8 kg (237 lb 10.5 oz)    Exam:   Afebrile, vitals overall stable. Her rate control.  General: Appears calm and comfortable. Speech fluent and clear.  Cardiovascular: Regular rate and rhythm. No murmur, rub or gallop. No lower extremity edema.  Respiratory: Clear to auscultation bilaterally. No wheezes, rales or rhonchi. Normal respiratory effort.  Right foot with wound VAC in  place.  Psychiatric: Grossly normal mood and affect. Speech fluent and appropriate.  Scheduled Meds: . allopurinol  100 mg Oral Daily  . amiodarone  100 mg Oral BID  . aspirin   81 mg Oral Daily  . atorvastatin  20 mg Oral q1800  . diltiazem  30 mg Oral Q6H  . docusate sodium  100 mg Oral BID  . epoetin alfa  10,000 Units Intravenous Q T,Th,Sa-HD  . feeding supplement (PRO-STAT SUGAR FREE 64)  30 mL Oral BID BM  . folic acid  1 mg Oral Daily  . heparin subcutaneous  5,000 Units Subcutaneous Q8H  . insulin aspart  0-5 Units Subcutaneous QHS  . insulin aspart  0-9 Units Subcutaneous TID WC  . insulin glargine  24 Units Subcutaneous QHS  . lubiprostone  8 mcg Oral BID WC  . metoprolol tartrate  100 mg Oral BID  . pantoprazole  40 mg Oral BID  . piperacillin-tazobactam (ZOSYN)  IV  2.25 g Intravenous Q8H  . sevelamer carbonate  800 mg Oral TID WC  . sodium chloride  3 mL Intravenous Q12H  . vancomycin  1,000 mg Intravenous Q T,Th,Sa-HD  . Warfarin - Pharmacist Dosing Inpatient   Does not apply Q24H   Continuous Infusions:    Principal Problem:   Acute osteomyelitis of toe of right foot Active Problems:   Anemia, chronic disease   Screening for colon cancer   Atrial fibrillation   [place] term (current) use of anticoagulants   CAD (coronary artery disease)   S/P CABG (coronary artery bypass graft)   H/O mitral valve replacement   ESRD (end stage renal disease) on dialysis   DM type 2 causing ESRD   Atrial fibrillation with RVR   Time spent 25 minutes

## 2013-07-16 NOTE — Progress Notes (Signed)
Left great toe amputation site healing well. Wound VAC changed. Arrangements have been made for home health to continue wound VAC care every Monday, Wednesday, and Friday.

## 2013-07-16 NOTE — Progress Notes (Signed)
ANTICOAGULATION CONSULT NOTE  Pharmacy Consult for Coumadin Indication: atrial fibrillation  Allergies  Allergen Reactions  . Norvasc [Amlodipine Besylate]    Patient Measurements: Height: 6\' 4"  (193 cm) Weight: 237 lb 10.5 oz (107.8 kg) IBW/kg (Calculated) : 86.8  Vital Signs: Temp: 98.8 F (37.1 C) (11/19 0532) Temp src: Oral (11/19 0532) BP: 135/68 mmHg (11/19 0532) Pulse Rate: 95 (11/19 0532)  Labs:  Recent Labs  07/14/13 0457  07/14/13 1107 07/15/13 0434 07/16/13 0606  HGB  --   < > 10.0* 9.2* 9.5*  HCT  --   --  30.6* 27.8* 29.1*  PLT  --   --  254 263 258  LABPROT 18.6*  --   --  19.9* 18.5*  INR 1.60*  --   --  1.75* 1.59*  CREATININE 8.82*  --  8.93* 10.75* 7.38*  < > = values in this interval not displayed. Estimated Creatinine Clearance: 13.6 ml/min (by C-G formula based on Cr of 7.38).  Medical History: Past Medical History  Diagnosis Date  . UGI bleed 02/12/11    on anticoagulation; EGD w/snare polypectomy-multiple polypoid lesions antrum(benign), Chronic active gastritis (NEGATIVE H pylori)  . Anemia, chronic disease   . Sepsis due to enterococcus 02/11/11  . Atrial fibrillation     coumadin  . CAD (coronary artery disease)     s/p CABGx4  . Cardiomyopathy     40-45% EF 6/12  . Diabetes mellitus   . Hypertension   . Hyperlipemia   . Gout   . S/P patent foramen ovale closure   . History of nuclear stress test 11/2011    negative myoview  . End stage kidney disease     T/TH/SAT dialyis, Dr Fausto Skillern  . Gout   . History of nuclear stress test 07/04/2010    dipyridamole; mod fixed inferolateral defect; non-diagnostic for ischemia; low risk scan    Medications:  Prescriptions prior to admission  Medication Sig Dispense Refill  . allopurinol (ZYLOPRIM) 100 MG tablet Take 100 mg by mouth daily.       Marland Kitchen amiodarone (PACERONE) 200 MG tablet Take 100 mg by mouth daily.       Marland Kitchen aspirin 81 MG tablet Take 81 mg by mouth daily.        Marland Kitchen atorvastatin  (LIPITOR) 20 MG tablet Take 1 tablet (20 mg total) by mouth daily.  30 tablet  11  . B Complex-C-Zn-Folic Acid (DIALYVITE/ZINC PO) Take by mouth 3 (three) times a week.       . folic acid (FOLVITE) 1 MG tablet Take 1 mg by mouth daily.        Marland Kitchen glimepiride (AMARYL) 4 MG tablet Take 4 mg by mouth 2 (two) times daily.        Marland Kitchen LANTUS SOLOSTAR 100 UNIT/ML injection Inject 38 Units into the skin at bedtime.       Marland Kitchen lubiprostone (AMITIZA) 8 MCG capsule Take 1 capsule (8 mcg total) by mouth 2 (two) times daily with a meal.  60 capsule  3  . metoprolol (LOPRESSOR) 50 MG tablet Take 50 mg by mouth 2 (two) times daily.       . Multiple Vitamin (MULTIVITAMIN) capsule Take 1 capsule by mouth daily.      . pantoprazole (PROTONIX) 40 MG tablet Take 40 mg by mouth 2 (two) times daily.       Marland Kitchen RENVELA 800 MG tablet Take 800 mg by mouth 3 (three) times daily with meals.       Marland Kitchen  torsemide (DEMADEX) 20 MG tablet Take 20 mg by mouth 2 (two) times daily.      . traZODone (DESYREL) 50 MG tablet Take 50 mg by mouth at bedtime.      Marland Kitchen warfarin (COUMADIN) 2.5 MG tablet Take 1.25 mg by mouth daily.      . digoxin (LANOXIN) 0.125 MG tablet Take 0.125 mg by mouth daily.       Assessment: 62yo male on chronic Coumadin PTA for h/o afib.  Home dose clarified as 1.25mg  daily. Pt did not receive any Coumadin from 11/11 thru 11/18 due to elevated INR initially then plans for surgery (amputation).  Coumadin resumed 11/18.  INR is below goal and trending down.  Pt is also on amiodarone and aspirin.  Goal of Therapy:  INR 2-3   Plan:  Coumadin 2.5 mg po today x 1 INR daily  Cortlyn Cannell A 07/16/2013,10:18 AM

## 2013-07-16 NOTE — Progress Notes (Signed)
[name] [name]  MRN: [id number]  DOB/AGE: 01-Apr-1951 62 y.o.  Primary Care Physician:FUSCO,LAWRENCE J., MD  Admit date: 07/07/2013  Chief Complaint:  Chief Complaint  Patient presents with  . Toe Pain    S-Pt presented on  07/07/2013 with  Chief Complaint  Patient presents with  . Toe Pain  .    Pt today offers no new complaints .    Pt even says " I have no pain"   Meds . allopurinol  100 mg Oral Daily  . amiodarone  100 mg Oral BID  . aspirin  81 mg Oral Daily  . atorvastatin  20 mg Oral q1800  . diltiazem  30 mg Oral Q6H  . docusate sodium  100 mg Oral BID  . epoetin alfa  10,000 Units Intravenous Q T,Th,Sa-HD  . feeding supplement (PRO-STAT SUGAR FREE 64)  30 mL Oral BID BM  . folic acid  1 mg Oral Daily  . heparin subcutaneous  5,000 Units Subcutaneous Q8H  . insulin aspart  0-5 Units Subcutaneous QHS  . insulin aspart  0-9 Units Subcutaneous TID WC  . insulin glargine  24 Units Subcutaneous QHS  . lubiprostone  8 mcg Oral BID WC  . metoprolol tartrate  100 mg Oral BID  . pantoprazole  40 mg Oral BID  . piperacillin-tazobactam (ZOSYN)  IV  2.25 g Intravenous Q8H  . sevelamer carbonate  800 mg Oral TID WC  . sodium chloride  3 mL Intravenous Q12H  . vancomycin  1,000 mg Intravenous Q T,Th,Sa-HD  . Warfarin - Pharmacist Dosing Inpatient   Does not apply Q24H      Physical Exam: Vital signs in last 24 hours: Temp:  [97.9 F (36.6 C)-98.8 F (37.1 C)] 98.8 F (37.1 C) (11/19 0532) Pulse Rate:  [47-100] 95 (11/19 0532) Resp:  [16-28] 22 (11/19 0532) BP: (100-153)/(49-102) 135/68 mmHg (11/19 0532) SpO2:  [89 %-100 %] 93 % (11/19 0532) Weight:  [237 lb 10.5 oz (107.8 kg)-243 lb 13.3 oz (110.6 kg)] 237 lb 10.5 oz (107.8 kg) (11/19 0532) Weight change: 2 lb 10.3 oz (1.2 kg) Last BM Date: 07/15/13  Intake/Output from previous day: 11/18 0701 - 11/19 0700 In: 880 [P.O.:540; I.V.:40; IV Piggyback:300] Out: 3083 [Stool:4]     Physical Exam: General- pt is  awake,alert, oriented to time place and person Resp- No acute REsp distress, CTA B/L NO Rhonchi CVS- S1S2 Irregular in rate and rhythm GIT- BS+, soft, NT, ND EXT- NO LE Edema, Right toe amputated, Wound being placed  Access -Right AVF +  Lab Results: CBC  Recent Labs  07/15/13 0434 07/16/13 0606  WBC 15.2* 12.5*  HGB 9.2* 9.5*  HCT 27.8* 29.1*  PLT 263 258    BMET  Recent Labs  07/15/13 0434 07/16/13 0606  NA 136 136  K 4.2 4.3  CL 95* 96  CO2 24 28  GLUCOSE 96 152*  BUN 53* 33*  CREATININE 10.75* 7.38*  CALCIUM 10.3 10.1    MICRO Recent Results (from the past 240 hour(s))  CULTURE, BLOOD (ROUTINE X 2)     Status: None   Collection Time    07/07/13  8:20 PM      Result Value Range Status   Specimen Description BLOOD LEFT ANTECUBITAL   Final   Special Requests BOTTLES DRAWN AEROBIC AND ANAEROBIC 10CC EACH   Final   Culture NO GROWTH 5 DAYS   Final   Report Status 07/12/2013 FINAL   Final  CULTURE, BLOOD (ROUTINE  X 2)     Status: None   Collection Time    07/07/13  8:25 PM      Result Value Range Status   Specimen Description BLOOD LEFT WRIST   Final   Special Requests BOTTLES DRAWN AEROBIC AND ANAEROBIC 8CC EACH   Final   Culture NO GROWTH 5 DAYS   Final   Report Status 07/12/2013 FINAL   Final  MRSA PCR SCREENING     Status: None   Collection Time    07/08/13  7:00 PM      Result Value Range Status   MRSA by PCR NEGATIVE  NEGATIVE Final   Comment:            The GeneXpert MRSA Assay (FDA     approved for NASAL specimens     only), is one component of a     comprehensive MRSA colonization     surveillance program. It is not     intended to diagnose MRSA     infection nor to guide or     monitor treatment for     MRSA infections.      Lab Results  Component Value Date   CALCIUM 10.1 07/16/2013   PHOS 5.7* 07/15/2013        Impression: 1)Renal  ESRD on HD                ON TTS schedule                Pt  Dialyzed yesterday    2)HTN   BP on lower side ( stable) Target Organ damage  CKD CAD CHF Medication- On Beta blockers and Calcium channel blockers    3)Anemia HGb at goal (9--11) On Epo  4)CKD Mineral-Bone Disorder Phosphorus at goal. Calcium at goal.  5)ID-admitted with Osteomyelitis S/p amputation  6)Electrolytes Normokalemic NOrmonatremic   7)Acid base Co2 at goal  8) Afib- On Coumadin and Amiodarone     Plan:  Will dialyze in am  Will continue curent tx      [name] [name] S 07/16/2013, 8:45 AM

## 2013-07-16 NOTE — Progress Notes (Signed)
Wound to right great toe measures 3cm length by 2cm width. Unable to measure depth due to wound vac in place. Wound vac changed today by Dr.[name].

## 2013-07-17 LAB — CBC
HCT: 33.3 % — ABNORMAL LOW (ref 39.0–52.0)
Hemoglobin: 10.9 g/dL — ABNORMAL LOW (ref 13.0–17.0)
MCH: 31.3 pg (ref 26.0–34.0)
MCHC: 32.7 g/dL (ref 30.0–36.0)
MCV: 95.7 fL (ref 78.0–100.0)
Platelets: 309 K/uL (ref 150–400)
RBC: 3.48 MIL/uL — ABNORMAL LOW (ref 4.22–5.81)
RDW: 14.8 % (ref 11.5–15.5)
WBC: 12.6 K/uL — ABNORMAL HIGH (ref 4.0–10.5)

## 2013-07-17 LAB — GLUCOSE, CAPILLARY
Glucose-Capillary: 162 mg/dL — ABNORMAL HIGH (ref 70–99)
Glucose-Capillary: 221 mg/dL — ABNORMAL HIGH (ref 70–99)

## 2013-07-17 LAB — BASIC METABOLIC PANEL WITH GFR
BUN: 49 mg/dL — ABNORMAL HIGH (ref 6–23)
CO2: 26 meq/L (ref 19–32)
Calcium: 10.9 mg/dL — ABNORMAL HIGH (ref 8.4–10.5)
Chloride: 95 meq/L — ABNORMAL LOW (ref 96–112)
Creatinine, Ser: 9.53 mg/dL — ABNORMAL HIGH (ref 0.50–1.35)
GFR calc Af Amer: 6 mL/min — ABNORMAL LOW (ref >90)
GFR calc non Af Amer: 5 mL/min — ABNORMAL LOW (ref >90)
Glucose, Bld: 155 mg/dL — ABNORMAL HIGH (ref 70–99)
Potassium: 4.9 meq/L (ref 3.5–5.1)
Sodium: 137 meq/L (ref 135–145)

## 2013-07-17 LAB — PROTIME-INR
INR: 1.72 — ABNORMAL HIGH (ref 0.00–1.49)
Prothrombin Time: 19.7 s — ABNORMAL HIGH (ref 11.6–15.2)

## 2013-07-17 MED ORDER — WARFARIN SODIUM 2.5 MG PO TABS
2.5000 mg | ORAL_TABLET | Freq: Once | ORAL | Status: AC
  Administered 2013-07-17: 2.5 mg via ORAL
  Filled 2013-07-17: qty 1

## 2013-07-17 MED ORDER — DILTIAZEM HCL 30 MG PO TABS
30.0000 mg | ORAL_TABLET | Freq: Once | ORAL | Status: AC
  Administered 2013-07-17: 30 mg via ORAL
  Filled 2013-07-17: qty 1

## 2013-07-17 MED ORDER — DILTIAZEM HCL ER COATED BEADS 180 MG PO CP24
180.0000 mg | ORAL_CAPSULE | Freq: Every day | ORAL | Status: DC
  Administered 2013-07-18: 180 mg via ORAL
  Filled 2013-07-17: qty 1

## 2013-07-17 NOTE — Progress Notes (Signed)
PT Cancellation Note  Patient Details Name: [name] [name] MRN: [id number] DOB: 1950/12/18   Cancelled Treatment:    Reason Eval/Treat Not Completed: Patient at procedure or test/unavailable Pt is receiving HD...has been there all morning and RN states that he will remain there for about 2 more hours.  I spoke with pt who reports that PTA he was able to ambulate independently.  The wound vac tubing is attached at the dorsal aspect of his foot, so he should be able to ambulate with weight bearing on the heel and lateral surface of his foot. I will obtain a post op shoe for his foot (size 12 shoe) and we will plan on doing gait training first thing in the AM.   Myrlene Broker L 07/17/2013, 12:04 PM

## 2013-07-17 NOTE — Progress Notes (Signed)
[facility] PROGRESS NOTE  [name] [name] NWG:[id number] DOB: 09-22-1950 DOA: 07/07/2013 PCP: Cassell Smiles., MD Followed by Dr. Alanda Amass, with cardiology. Followed by Dr. Kristian Covey with nephrology. He gets dialyzed Tuesday, Thursdays, and Saturdays  Summary: 62 year old man with history of end-stage renal disease, diabetes mellitus, mitral valve replacement with tissue valve, status post CABG, atrial fibrillation maintained on warfarin who presented to the emergency department with a history of having had his nail, developed pain, swelling and redness. X-ray suggested osteomyelitis and patient was admitted for further evaluation. Shortly after hospitalization he developed atrial fibrillation with rapid ventricular response. He developed gangrene of the right toe and required amputation which was successfully accomplished. Atrial fibrillation control has been better postoperatively. He will continue on IV antibiotics for 2 weeks per infectious disease. Rate control medications will be adjusted. Anticipate discharge home in next 24 hours if heart rate remains stable. Await physical therapy evaluation to assess safety of discharge home.  Assessment/Plan: 1. Osteomyelitis of the right great toe with associated sepsis and developing gangrene: Now status post amputation 11/17. Continue empiric antibiotics for 2 weeks per infectious disease Dr. Daiva Eves. Will likely discharge on vancomycin and ceftazidime so that they may be administered with dialysis. 2. Atrial fibrillation with rapid ventricular response: Control improved. Continue metoprolol. Change diltiazem to [place] acting.  Continue to monitor on telemetry for tachycardias 3. Type 2 diabetes mellitus: Well controlled. Continue Lantus and sliding scale. 4. End stage renal disease on hemodialysis: Management deferred to nephrology 5. Normocytic anemia: Likely secondary chronic disease. stable 6. History of coronary artery disease: Remains  asymptomatic. 7. History of left ventricular ejection fraction 35% by 2-D echo 12/2006. Remains asymptomatic. 8. Gout  Pending studies:   none  Code Status: full code DVT prophylaxis: Lovenox Family Communication: None present Disposition Plan: pending further evaluation  Erick Blinks, MD  [facility]  Pager [phone] If 7PM-7AM, please contact night-coverage at [email or web address], password [facility] 07/17/2013, 5:36 PM  LOS: 10 days   Consultants:  Nephrology  General surgery  Procedures:  Right great toe amputation 11/17  Antibiotics:  Vancomycin 11/10 >>   Zosyn 11/10 >>   HPI/Subjective: No complaints. Feeling fine. Has not really ambulated.  Objective: Filed Vitals:   07/17/13 1230 07/17/13 1300 07/17/13 1330 07/17/13 1400  BP: 128/68 139/71 111/57 130/74  Pulse: 86 81 91 91  Temp:    98.4 F (36.9 C)  TempSrc:    Oral  Resp:    22  Height:      Weight:      SpO2:    96%    Intake/Output Summary (Last 24 hours) at 07/17/13 1736 Last data filed at 07/17/13 1400  Gross per 24 hour  Intake    120 ml  Output   1901 ml  Net  -1781 ml     Filed Weights   07/15/13 1215 07/15/13 1640 07/16/13 0532  Weight: 110.6 kg (243 lb 13.3 oz) 108.2 kg (238 lb 8.6 oz) 107.8 kg (237 lb 10.5 oz)    Exam:   General: Appears calm and comfortable. Speech fluent and clear.  Cardiovascular: Regular rate and rhythm. No murmur, rub or gallop. No lower extremity edema.  Respiratory: Clear to auscultation bilaterally. No wheezes, rales or rhonchi. Normal respiratory effort.  Right foot with wound VAC in place.  Psychiatric: Grossly normal mood and affect. Speech fluent and appropriate.  Scheduled Meds: . allopurinol  100 mg Oral Daily  . amiodarone  100 mg Oral BID  . aspirin  81 mg Oral Daily  . atorvastatin  20 mg Oral q1800  . diltiazem  30 mg Oral Q6H  . docusate sodium  100 mg Oral BID  . epoetin alfa  10,000 Units Intravenous Q T,Th,Sa-HD  . feeding  supplement (PRO-STAT SUGAR FREE 64)  30 mL Oral BID BM  . folic acid  1 mg Oral Daily  . heparin subcutaneous  5,000 Units Subcutaneous Q8H  . insulin aspart  0-5 Units Subcutaneous QHS  . insulin aspart  0-9 Units Subcutaneous TID WC  . insulin glargine  24 Units Subcutaneous QHS  . lubiprostone  8 mcg Oral BID WC  . metoprolol tartrate  100 mg Oral BID  . pantoprazole  40 mg Oral BID  . piperacillin-tazobactam (ZOSYN)  IV  2.25 g Intravenous Q8H  . sevelamer carbonate  800 mg Oral TID WC  . sodium chloride  3 mL Intravenous Q12H  . vancomycin  1,000 mg Intravenous Q T,Th,Sa-HD  . Warfarin - Pharmacist Dosing Inpatient   Does not apply Q24H   Continuous Infusions:    Principal Problem:   Acute osteomyelitis of toe of right foot Active Problems:   Anemia, chronic disease   Screening for colon cancer   Atrial fibrillation   [place] term (current) use of anticoagulants   CAD (coronary artery disease)   S/P CABG (coronary artery bypass graft)   H/O mitral valve replacement   ESRD (end stage renal disease) on dialysis   DM type 2 causing ESRD   Atrial fibrillation with RVR   Time spent 25 minutes

## 2013-07-17 NOTE — Progress Notes (Signed)
3 Days Post-Op  Subjective: No complaints of right foot pain.  Objective: Vital signs in last 24 hours: Temp:  [97.3 F (36.3 C)-98.1 F (36.7 C)] 97.3 F (36.3 C) (11/20 0442) Pulse Rate:  [73-82] 78 (11/20 0442) Resp:  [21-22] 21 (11/20 0442) BP: (108-137)/(62-109) 137/109 mmHg (11/20 0442) SpO2:  [95 %-100 %] 100 % (11/20 0442) Last BM Date: 07/16/13  Intake/Output from previous day: 11/19 0701 - 11/20 0700 In: 600 [P.O.:600] Out: 1 [Stool:1] Intake/Output this shift:    Extremities: No swelling in right foot. Wound VAC in place. Functioning well.  Lab Results:   Recent Labs  07/16/13 0606 07/17/13 0616  WBC 12.5* 12.6*  HGB 9.5* 10.9*  HCT 29.1* 33.3*  PLT 258 309   BMET  Recent Labs  07/16/13 0606 07/17/13 0616  NA 136 137  K 4.3 4.9  CL 96 95*  CO2 28 26  GLUCOSE 152* 155*  BUN 33* 49*  CREATININE 7.38* 9.53*  CALCIUM 10.1 10.9*   PT/INR  Recent Labs  07/16/13 0606 07/17/13 0616  LABPROT 18.5* 19.7*  INR 1.59* 1.72*    Studies/Results: No results found.  Anti-infectives: Anti-infectives   Start     Dose/Rate Route Frequency Ordered Stop   07/08/13 1200  vancomycin (VANCOCIN) IVPB 1000 mg/200 mL premix     1,000 mg 200 mL/hr over 60 Minutes Intravenous Every T-Th-Sa (Hemodialysis) 07/08/13 1010     07/08/13 0500  piperacillin-tazobactam (ZOSYN) IVPB 2.25 g     2.25 g 100 mL/hr over 30 Minutes Intravenous 3 times per day 07/07/13 2017     07/07/13 2130  vancomycin (VANCOCIN) IVPB 1000 mg/200 mL premix     1,000 mg 200 mL/hr over 60 Minutes Intravenous  Once 07/07/13 2012 07/08/13 0025   07/07/13 2030  vancomycin (VANCOCIN) IVPB 1000 mg/200 mL premix     1,000 mg 200 mL/hr over 60 Minutes Intravenous  Once 07/07/13 2012 07/07/13 2223   07/07/13 2030  piperacillin-tazobactam (ZOSYN) IVPB 3.375 g     3.375 g 100 mL/hr over 30 Minutes Intravenous  Once 07/07/13 2013 07/07/13 2153      Assessment/Plan: s/p Procedure(s): AMPUTATION  DIGIT Impression: Right great toe amputation site healing well with wound VAC in place. Home health care has been initiated.  LOS: 10 days    Kischa Altice A 07/17/2013

## 2013-07-17 NOTE — Progress Notes (Signed)
ANTICOAGULATION CONSULT NOTE  Pharmacy Consult for Coumadin Indication: atrial fibrillation  Allergies  Allergen Reactions  . Norvasc [Amlodipine Besylate]    Patient Measurements: Height: 6\' 4"  (193 cm) Weight: 237 lb 10.5 oz (107.8 kg) IBW/kg (Calculated) : 86.8  Vital Signs: Temp: 97.3 F (36.3 C) (11/20 0442) Temp src: Oral (11/20 0442) BP: 137/109 mmHg (11/20 0442) Pulse Rate: 78 (11/20 0442)  Labs:  Recent Labs  07/15/13 0434 07/16/13 0606 07/17/13 0616  HGB 9.2* 9.5* 10.9*  HCT 27.8* 29.1* 33.3*  PLT 263 258 309  LABPROT 19.9* 18.5* 19.7*  INR 1.75* 1.59* 1.72*  CREATININE 10.75* 7.38* 9.53*   Estimated Creatinine Clearance: 10.5 ml/min (by C-G formula based on Cr of 9.53).  Medical History: Past Medical History  Diagnosis Date  . UGI bleed 02/12/11    on anticoagulation; EGD w/snare polypectomy-multiple polypoid lesions antrum(benign), Chronic active gastritis (NEGATIVE H pylori)  . Anemia, chronic disease   . Sepsis due to enterococcus 02/11/11  . Atrial fibrillation     coumadin  . CAD (coronary artery disease)     s/p CABGx4  . Cardiomyopathy     40-45% EF 6/12  . Diabetes mellitus   . Hypertension   . Hyperlipemia   . Gout   . S/P patent foramen ovale closure   . History of nuclear stress test 11/2011    negative myoview  . End stage kidney disease     T/TH/SAT dialyis, Dr Fausto Skillern  . Gout   . History of nuclear stress test 07/04/2010    dipyridamole; mod fixed inferolateral defect; non-diagnostic for ischemia; low risk scan    Medications:  Prescriptions prior to admission  Medication Sig Dispense Refill  . allopurinol (ZYLOPRIM) 100 MG tablet Take 100 mg by mouth daily.       Marland Kitchen amiodarone (PACERONE) 200 MG tablet Take 100 mg by mouth daily.       Marland Kitchen aspirin 81 MG tablet Take 81 mg by mouth daily.        Marland Kitchen atorvastatin (LIPITOR) 20 MG tablet Take 1 tablet (20 mg total) by mouth daily.  30 tablet  11  . B Complex-C-Zn-Folic Acid  (DIALYVITE/ZINC PO) Take by mouth 3 (three) times a week.       . folic acid (FOLVITE) 1 MG tablet Take 1 mg by mouth daily.        Marland Kitchen glimepiride (AMARYL) 4 MG tablet Take 4 mg by mouth 2 (two) times daily.        Marland Kitchen LANTUS SOLOSTAR 100 UNIT/ML injection Inject 38 Units into the skin at bedtime.       Marland Kitchen lubiprostone (AMITIZA) 8 MCG capsule Take 1 capsule (8 mcg total) by mouth 2 (two) times daily with a meal.  60 capsule  3  . metoprolol (LOPRESSOR) 50 MG tablet Take 50 mg by mouth 2 (two) times daily.       . Multiple Vitamin (MULTIVITAMIN) capsule Take 1 capsule by mouth daily.      . pantoprazole (PROTONIX) 40 MG tablet Take 40 mg by mouth 2 (two) times daily.       Marland Kitchen RENVELA 800 MG tablet Take 800 mg by mouth 3 (three) times daily with meals.       . torsemide (DEMADEX) 20 MG tablet Take 20 mg by mouth 2 (two) times daily.      . traZODone (DESYREL) 50 MG tablet Take 50 mg by mouth at bedtime.      Marland Kitchen warfarin (COUMADIN) 2.5 MG  tablet Take 1.25 mg by mouth daily.      . digoxin (LANOXIN) 0.125 MG tablet Take 0.125 mg by mouth daily.       Assessment: 62yo male on chronic Coumadin PTA for h/o afib.  Home dose clarified as 1.25mg  daily. Pt did not receive any Coumadin from 11/11 thru 11/18 due to elevated INR initially then plans for surgery (amputation).  Coumadin resumed 11/18.  INR is below goal.  Pt is on SQ Heparin until INR therapeutic.  Pt is also on amiodarone and aspirin.  Goal of Therapy:  INR 2-3   Plan:  D/C SQ heparin when INR therapeutic. Coumadin 2.5 mg po today x 1 INR daily  Valrie Hart A 07/17/2013,9:02 AM

## 2013-07-17 NOTE — Procedures (Signed)
   HEMODIALYSIS TREATMENT NOTE:  4 hour hemodialysis treatment completed via right forearm AVF (15g/antegrade).  Goal met:  Tolerated removal of 1.9liters with no interruption in ultrafiltration. Pt received EPO 10K units and Vancomycin 1 g on HD.  All blood was reinfused.  Hemostasis was achieved within 15 minutes.  Report given to Alisia Ferrari, RN.    Shaniquia Brafford L. Amariz Flamenco, RN, CDN

## 2013-07-17 NOTE — Progress Notes (Signed)
Subjective: Interval History: . Patient appetite is good and he doesn't have any nausea or vomiting. Presently patient offers no complaints. His s/p amputation of his toe. He offers no complaints.  Objective: Vital signs in last 24 hours: Temp:  [97.3 F (36.3 C)-98.1 F (36.7 C)] 97.3 F (36.3 C) (11/20 0442) Pulse Rate:  [73-82] 78 (11/20 0442) Resp:  [21-22] 21 (11/20 0442) BP: (108-137)/(62-109) 137/109 mmHg (11/20 0442) SpO2:  [95 %-100 %] 100 % (11/20 0442) Weight change:   Intake/Output from previous day: 11/19 0701 - 11/20 0700 In: 600 [P.O.:600] Out: 1 [Stool:1] Intake/Output this shift:    Generally he is alert and apparent distress Chest is clear to auscultation Heart exam revealed a regular rate and rhythm 2/6 systolic ejection murmur. Abdomen soft positive bowel sound Extremities he has trace edema.  Lab Results:  Recent Labs  07/16/13 0606 07/17/13 0616  WBC 12.5* 12.6*  HGB 9.5* 10.9*  HCT 29.1* 33.3*  PLT 258 309   BMET:   Recent Labs  07/16/13 0606 07/17/13 0616  NA 136 137  K 4.3 4.9  CL 96 95*  CO2 28 26  GLUCOSE 152* 155*  BUN 33* 49*  CREATININE 7.38* 9.53*  CALCIUM 10.1 10.9*   No results found for this basename: PTH,  in the last 72 hours Iron Studies: No results found for this basename: IRON, TIBC, TRANSFERRIN, FERRITIN,  in the last 72 hours  Studies/Results: No results found.  I have reviewed the patient's current medications.  Assessment/Plan: Problem #1 end-stage renal disease is status post hemodialysis the day before   yesterday. His BUN is 49 and his creatinine is 9.53 . His potassium is normal. Patient does not  Have uremic sign and symptoms Problem #2 diabetes Problem #3 CAD Problem #4 anemia his hemoglobin and hematocrit is with  in target range. Patient is on Epogen. Problem #5 osteomyelitis of his right toe. S/P amputation Problem #6 history of peripheral vascular disease Problem #7 metabolic bone disease his  calcium and phosphorus was in acceptable range. Problem #8 history of chronic hypotension. Problem #9 history of a trial fibrillation. His heart rate is controlled. Plan: Will dialyze patient today.           We'll continue wirh  low temperature dialysate           We'll we'll follow patient on dialysis when his discharged.   LOS: 10 days   Marvelyn Bouchillon S 07/17/2013,8:04 AM

## 2013-07-18 LAB — GLUCOSE, CAPILLARY: Glucose-Capillary: 142 mg/dL — ABNORMAL HIGH (ref 70–99)

## 2013-07-18 MED ORDER — DILTIAZEM HCL ER COATED BEADS 180 MG PO CP24: 180.0000 mg | ORAL_CAPSULE | Freq: Every day | ORAL | Status: DC

## 2013-07-18 MED ORDER — INSULIN GLARGINE 100 UNIT/ML SOLOSTAR PEN: 24.0000 [IU] | PEN_INJECTOR | Freq: Every day | SUBCUTANEOUS | Status: DC

## 2013-07-18 MED ORDER — METOPROLOL TARTRATE 100 MG PO TABS: 100.0000 mg | ORAL_TABLET | Freq: Two times a day (BID) | ORAL | Status: DC

## 2013-07-18 MED ORDER — WARFARIN SODIUM 2 MG PO TABS
2.0000 mg | ORAL_TABLET | Freq: Once | ORAL | Status: AC
  Administered 2013-07-18: 2 mg via ORAL
  Filled 2013-07-18: qty 1

## 2013-07-18 MED FILL — Bupivacaine HCl Preservative Free (PF) Inj 0.5%: INTRAMUSCULAR | Qty: 30 | Status: AC

## 2013-07-18 NOTE — Progress Notes (Signed)
[facility] PROGRESS NOTE  [name] KINZLER ZOX:[id number] DOB: [birth year] DOA: 07/07/2013 PCP: Cassell Smiles., MD Followed by Dr. Alanda Amass, with cardiology. Followed by Dr. Kristian Covey with nephrology. He gets dialyzed Tuesday, Thursdays, and Saturdays  Summary: 62 year old man with history of end-stage renal disease, diabetes mellitus, mitral valve replacement with tissue valve, status post CABG, atrial fibrillation maintained on warfarin who presented to the emergency department with a history of having had his nail, developed pain, swelling and redness. X-ray suggested osteomyelitis and patient was admitted for further evaluation. Shortly after hospitalization he developed atrial fibrillation with rapid ventricular response. He developed gangrene of the right toe and required amputation which was successfully accomplished. Atrial fibrillation control has been better postoperatively. He will continue on IV antibiotics for 2 weeks per infectious disease. Rate control medications will be adjusted. Anticipate discharge home in next 24 hours if heart rate remains stable. Await physical therapy evaluation to assess safety of discharge home.  Assessment/Plan: 1. Osteomyelitis of the right great toe with associated sepsis and developing gangrene: Now status post amputation 11/17. Continue empiric antibiotics for 2 weeks per infectious disease Dr. Daiva Eves. Will likely discharge on vancomycin and ceftazidime so that they may be administered with dialysis. 2. Atrial fibrillation with rapid ventricular response: Control improved. Continue metoprolol. Change diltiazem to Bistline acting.  Continue to monitor on telemetry for tachycardias 3. Type 2 diabetes mellitus: Well controlled. Continue Lantus and sliding scale. 4. End stage renal disease on hemodialysis: Management deferred to nephrology 5. Normocytic anemia: Likely secondary chronic disease. stable 6. History of coronary artery disease: Remains  asymptomatic. 7. History of left ventricular ejection fraction 35% by 2-D echo 12/2006. Remains asymptomatic. 8. Gout  Pending studies:   none  Code Status: full code DVT prophylaxis: Lovenox Family Communication: None present Disposition Plan: pending further evaluation  Erick Blinks, MD  [facility]  Pager [phone] If 7PM-7AM, please contact night-coverage at [email or web address], password [facility] 07/18/2013, 7:35 AM  LOS: 11 days   Consultants:  Nephrology  General surgery  Procedures:  Right great toe amputation 11/17  Antibiotics:  Vancomycin 11/10 >>   Zosyn 11/10 >>   HPI/Subjective: No complaints. Feeling fine. Has not really ambulated.  Objective: Filed Vitals:   07/17/13 1330 07/17/13 1400 07/17/13 2140 07/18/13 0558  BP: 111/57 130/74 143/77 116/86  Pulse: 91 91 89 93  Temp:  98.4 F (36.9 C) 98.2 F (36.8 C) 98.1 F (36.7 C)  TempSrc:  Oral Oral Oral  Resp:  22 17 16   Height:      Weight:      SpO2:  96% 94% 93%    Intake/Output Summary (Last 24 hours) at 07/18/13 0735 Last data filed at 07/18/13 0500  Gross per 24 hour  Intake    120 ml  Output   2100 ml  Net  -1980 ml     Filed Weights   07/15/13 1215 07/15/13 1640 07/16/13 0532  Weight: 110.6 kg (243 lb 13.3 oz) 108.2 kg (238 lb 8.6 oz) 107.8 kg (237 lb 10.5 oz)    Exam:   General: Appears calm and comfortable. Speech fluent and clear.  Cardiovascular: Regular rate and rhythm. No murmur, rub or gallop. No lower extremity edema.  Respiratory: Clear to auscultation bilaterally. No wheezes, rales or rhonchi. Normal respiratory effort.  Right foot with wound VAC in place.  Psychiatric: Grossly normal mood and affect. Speech fluent and appropriate.  Scheduled Meds: . allopurinol  100 mg Oral Daily  . amiodarone  100  mg Oral BID  . aspirin  81 mg Oral Daily  . atorvastatin  20 mg Oral q1800  . diltiazem  180 mg Oral Daily  . docusate sodium  100 mg Oral BID  . epoetin alfa   10,000 Units Intravenous Q T,Th,Sa-HD  . feeding supplement (PRO-STAT SUGAR FREE 64)  30 mL Oral BID BM  . folic acid  1 mg Oral Daily  . heparin subcutaneous  5,000 Units Subcutaneous Q8H  . insulin aspart  0-5 Units Subcutaneous QHS  . insulin aspart  0-9 Units Subcutaneous TID WC  . insulin glargine  24 Units Subcutaneous QHS  . lubiprostone  8 mcg Oral BID WC  . metoprolol tartrate  100 mg Oral BID  . pantoprazole  40 mg Oral BID  . piperacillin-tazobactam (ZOSYN)  IV  2.25 g Intravenous Q8H  . sevelamer carbonate  800 mg Oral TID WC  . sodium chloride  3 mL Intravenous Q12H  . vancomycin  1,000 mg Intravenous Q T,Th,Sa-HD  . Warfarin - Pharmacist Dosing Inpatient   Does not apply Q24H   Continuous Infusions:    Principal Problem:   Acute osteomyelitis of toe of right foot Active Problems:   Anemia, chronic disease   Screening for colon cancer   Atrial fibrillation   Ganus term (current) use of anticoagulants   CAD (coronary artery disease)   S/P CABG (coronary artery bypass graft)   H/O mitral valve replacement   ESRD (end stage renal disease) on dialysis   DM type 2 causing ESRD   Atrial fibrillation with RVR   Time spent 25 minutes

## 2013-07-18 NOTE — Discharge Summary (Signed)
Physician Discharge Summary  [name] [name]:[id number] DOB: 01/12/1951 DOA: 07/07/2013  PCP: Cassell Smiles., MD  Admit date: 07/07/2013 Discharge date: 07/18/2013  Time spent: 45 minutes  Recommendations for Outpatient Follow-up:  1. Patient will be discharged home with wound vac which will be changed MWF 2. Continue with dialysis on TThSat 3. Continue vancomycin and ceftazidime for another 2 weeks with dialysis, discussed with Dr. Kristian [name] 4. Follow up with primary care doctor in 2 weeks 5. Follow up with general surgery.  Discharge Diagnoses:  Principal Problem:   Acute osteomyelitis of toe of right foot Active Problems:   Anemia, chronic disease   Screening for colon cancer   Atrial fibrillation   Sebesta term (current) use of anticoagulants   CAD (coronary artery disease)   S/P CABG (coronary artery bypass graft)   H/O mitral valve replacement   ESRD (end stage renal disease) on dialysis   DM type 2 causing ESRD   Atrial fibrillation with RVR   Discharge Condition: improved  Diet recommendation: low salt, low carb  Filed Weights   07/15/13 1215 07/15/13 1640 07/16/13 0532  Weight: 110.6 kg (243 lb 13.3 oz) 108.2 kg (238 lb 8.6 oz) 107.8 kg (237 lb 10.5 oz)    History of present illness:  [name] [name] is a 62 y.o. male with a past medical history of coronary artery disease, status post CABG, mitral valve replacement with a tissue valve, end-stage renal disease, diabetes, hypertension, atrial fibrillation on Coumadin who was in his usual state of health till to 3 days ago, when he was cutting his toenails and he thinks he may have cut his nail very close on the right first toe. Patient is not a very good historian. He takes a Gilkes time to answer my questions and is not very specific. His wife is with him, but she is also unable to answer many questions. In any case, he stated that after he cut his nail he noticed some pain in that right toe along with swelling and  redness. He's noticed some drainage from that toe around the nail area, but unable to specify any further. He denies any nausea, vomiting. Has felt feverish, but did not check his temperature. He has had chills. He's never had this problem before. He denies being on any new medications. Denies being taken off of any medications. Since the swelling was getting worse he went to his primary care physician's office and was referred from there to the emergency department.   Hospital Course:  Patient was admitted with a similar history right great toe with associated sepsis. He underwent amputation by general surgery on 11/17. His post operative course was unremarkable. He will be continued on empiric antibiotics for 2 weeks per infectious disease. This is being arranged to be administered with dialysis in conjunction with nephrology. The patient was continued on dialysis per nephrology on Tuesday Thursday Saturday. He did develop some rapid atrial fibrillation which was controlled with rate control medications. He is currently rate controlled and anticoagulated. The remainder of his medical problems are stable. He is felt stable for discharge home today.  Procedures:  Right great toe amputation 11/17  Consultations:  Nephrology  General Surgery  Discharge Exam: Filed Vitals:   07/18/13 0558  BP: 116/86  Pulse: 93  Temp: 98.1 F (36.7 C)  Resp: 16    General: NAD Cardiovascular: S1, S2 irregular Respiratory: cta b  Discharge Instructions  Discharge Orders   Future Orders Complete By Expires  Call MD for:  extreme fatigue  As directed    Call MD for:  persistant dizziness or light-headedness  As directed    Call MD for:  redness, tenderness, or signs of infection (pain, swelling, redness, odor or green/yellow discharge around incision site)  As directed    Call MD for:  temperature >100.4  As directed    Diet - low sodium heart healthy  As directed    Face-to-face encounter (required  for Medicare/Medicaid patients)  As directed    Comments:     I [name] [name] certify that this patient is under my care and that I, or a nurse practitioner or physician's assistant working with me, had a face-to-face encounter that meets the physician face-to-face encounter requirements with this patient on 07/18/2013. The encounter with the patient was in whole, or in part for the following medical condition(s) which is the primary reason for home health care (List medical condition): patient is s/p amputation of toe and is on wound vac.  Would benefit from home health rn.   Questions:     The encounter with the patient was in whole, or in part, for the following medical condition, which is the primary reason for home health care:  ostemyelitis of toe   I certify that, based on my findings, the following services are medically necessary home health services:  Nursing   My clinical findings support the need for the above services:  Pain interferes with ambulation/mobility   Further, I certify that my clinical findings support that this patient is homebound due to:  Open/draining pressure/stasis ulcer   Reason for Medically Necessary Home Health Services:  Skilled Nursing- Post-Surgical Wound Assessment and Care   Home Health  As directed    Questions:     To provide the following care/treatments:  RN   Increase activity slowly  As directed        Medication List    STOP taking these medications       digoxin 0.125 MG tablet  Commonly known as:  LANOXIN      TAKE these medications       allopurinol 100 MG tablet  Commonly known as:  ZYLOPRIM  Take 100 mg by mouth daily.     amiodarone 200 MG tablet  Commonly known as:  PACERONE  Take 100 mg by mouth daily.     aspirin 81 MG tablet  Take 81 mg by mouth daily.     atorvastatin 20 MG tablet  Commonly known as:  LIPITOR  Take 1 tablet (20 mg total) by mouth daily.     DIALYVITE/ZINC PO  Take by mouth 3 (three) times a week.      diltiazem 180 MG 24 hr capsule  Commonly known as:  CARDIZEM CD  Take 1 capsule (180 mg total) by mouth daily.     folic acid 1 MG tablet  Commonly known as:  FOLVITE  Take 1 mg by mouth daily.     glimepiride 4 MG tablet  Commonly known as:  AMARYL  Take 4 mg by mouth 2 (two) times daily.     Insulin Glargine 100 UNIT/ML Sopn  Commonly known as:  LANTUS SOLOSTAR  Inject 24 Units into the skin at bedtime.     lubiprostone 8 MCG capsule  Commonly known as:  AMITIZA  Take 1 capsule (8 mcg total) by mouth 2 (two) times daily with a meal.     metoprolol 100 MG tablet  Commonly known as:  LOPRESSOR  Take 1 tablet (100 mg total) by mouth 2 (two) times daily.     multivitamin capsule  Take 1 capsule by mouth daily.     pantoprazole 40 MG tablet  Commonly known as:  PROTONIX  Take 40 mg by mouth 2 (two) times daily.     RENVELA 800 MG tablet  Generic drug:  sevelamer carbonate  Take 800 mg by mouth 3 (three) times daily with meals.     torsemide 20 MG tablet  Commonly known as:  DEMADEX  Take 20 mg by mouth 2 (two) times daily.     traZODone 50 MG tablet  Commonly known as:  DESYREL  Take 50 mg by mouth at bedtime.     warfarin 2.5 MG tablet  Commonly known as:  COUMADIN  Take 1.25 mg by mouth daily.       Allergies  Allergen Reactions  . Norvasc [Amlodipine Besylate]        Follow-up Information   Follow up with Cassell Smiles., MD. Schedule an appointment as soon as possible for a visit in 2 weeks.   Specialty:  Internal Medicine   Contact information:   [address] [place] [address] [phone]        The results of significant diagnostics from this hospitalization (including imaging, microbiology, ancillary and laboratory) are listed below for reference.    Significant Diagnostic Studies: US Arterial Seg Single  15-Jul-2013   CLINICAL DATA:  Diabetes and right 1st toe osteomyelitis.  EXAM: NONINVASIVE PHYSIOLOGIC  VASCULAR STUDY OF BILATERAL LOWER EXTREMITIES  TECHNIQUE: Evaluation of both lower extremities was performed at rest, including calculation of ankle-brachial indices, multiple segmental pressure evaluation, segmental Doppler and segmental pulse volume recording.  COMPARISON:  None.  FINDINGS: Right ABI:  Not calculable.  Left ABI:  Not calculable.  Right Lower Extremity: Arteries throughout the right lower extremity are noncompressible and accurate segmental pressure values could not be obtained. An ankle brachial index could therefore not be calculated. Waveform analysis demonstrates essentially biphasic arterial waveforms at the femoral, superficial femoral, popliteal, post should tibial and dorsalis pedis levels. The 1st toe pressure is 127 mm of mercury with a normal toe brachial index of 0.81. Digital waveform show some probable component of digital small vessel disease, especially in the 1st through 3rd toes.  Left Lower Extremity: Similar to the right side, arteries are completely noncompressible throughout the left lower extremity. Accurate ABI could not be calculated. Waveforms are biphasic at the femoral, superficial femoral, popliteal, post should tibial and dorsalis pedis levels. The 1st toe pressure is 90 mm of mercury with calculated toe brachial index of 0.59. Digital waveforms show normal amplitude of the 1st toe and depressed amplitude at the level of the 2nd through 5th toes.  IMPRESSION: Noncompressible arteries throughout both lower extremities, likely related to diabetic arterial calcification. Ankle-brachial indices cannot be calculated. Based on distal waveforms, there likely is a component of digitals arterial disease in both feet. The 1st toe pressure is normal on the right and mildly depressed on the left.   Electronically Signed   By: Irish Lack M.D.   On: 07/15/2013 17:23   Dg Toe Great Right  07/07/2013   CLINICAL DATA:  Right great toe pain and swelling.  EXAM: RIGHT GREAT TOE   COMPARISON:  None.  FINDINGS: There is lytic destruction of the distal tuft of the 1st distal phalanx most consistent with osteomyelitis. Joint spaces appear intact.  IMPRESSION: Lytic destruction of  distal tuft of 1st distal phalanx most consistent with osteomyelitis.   Electronically Signed   By: Roque Lias M.D.   On: 07/07/2013 19:36    Microbiology: Recent Results (from the past 240 hour(s))  MRSA PCR SCREENING     Status: None   Collection Time    07/08/13  7:00 PM      Result Value Range Status   MRSA by PCR NEGATIVE  NEGATIVE Final   Comment:            The GeneXpert MRSA Assay (FDA     approved for NASAL specimens     only), is one component of a     comprehensive MRSA colonization     surveillance program. It is not     intended to diagnose MRSA     infection nor to guide or     monitor treatment for     MRSA infections.     Labs: Basic Metabolic Panel:  Recent Labs Lab 07/12/13 0533 07/14/13 0457 07/14/13 1107 07/15/13 0434 07/16/13 0606 07/17/13 0616  NA 135 138 134* 136 136 137  K 4.2 3.8 4.2 4.2 4.3 4.9  CL 94* 95* 93* 95* 96 95*  CO2 26 29 26 24 28 26   GLUCOSE 133* 116* 213* 96 152* 155*  BUN 45* 41* 44* 53* 33* 49*  CREATININE 9.77* 8.82* 8.93* 10.75* 7.38* 9.53*  CALCIUM 10.7* 10.4 10.5 10.3 10.1 10.9*  PHOS 4.5  --   --  5.7*  --   --    Liver Function Tests: No results found for this basename: AST, ALT, ALKPHOS, BILITOT, PROT, ALBUMIN,  in the last 168 hours No results found for this basename: LIPASE, AMYLASE,  in the last 168 hours No results found for this basename: AMMONIA,  in the last 168 hours CBC:  Recent Labs Lab 07/12/13 0533 07/14/13 1107 07/15/13 0434 07/16/13 0606 07/17/13 0616  WBC 15.7* 13.2* 15.2* 12.5* 12.6*  NEUTROABS  --  11.5*  --   --   --   HGB 9.9* 10.0* 9.2* 9.5* 10.9*  HCT 29.9* 30.6* 27.8* 29.1* 33.3*  MCV 95.5 96.2 94.6 95.7 95.7  PLT 241 254 263 258 309   Cardiac Enzymes: No results found for this basename:  CKTOTAL, CKMB, CKMBINDEX, TROPONINI,  in the last 168 hours BNP: BNP (last 3 results) No results found for this basename: PROBNP,  in the last 8760 hours CBG:  Recent Labs Lab 07/17/13 0739 07/17/13 1701 07/17/13 2138 07/18/13 0719 07/18/13 1128  GLUCAP 138* 162* 221* 105* 207*       Signed:  Jarry Manon  [facility] 07/18/2013, 2:28 PM

## 2013-07-18 NOTE — Evaluation (Signed)
Physical Therapy Evaluation Patient Details Name: [name] [name] MRN: [id number] DOB: [birth year] Today's Date: 07/18/2013 Time: 4098-1191 PT Time Calculation (min): 42 min  PT Assessment / Plan / Recommendation History of Present Illness  Pt is admitted for osteomyelitis of the right great toe for which he had an amputation on 07-14-13.  He now has a wound vac on the resulting wound.  He has DM and end stage renal failure and is on HD 3x/week.  He had been totally independent at home PTA.  Clinical Impression   Pt was seen for gait training.  He was alert and cooperative, feeling well.  He was instructed in gait using a walker in order to off load weight from the right forefoot.  He was issued a right post op shoe and instructed in how to don it.  He was instructed in gait on level ground and steps.  He needed min assist on steps and will have a family member assist him with this.  He was instructed to minimize gait and elevate the right foot as much as possible to facilitate healing.  He understood all directions.    PT Assessment  Patent does not need any further PT services    Follow Up Recommendations  No PT follow up    Does the patient have the potential to tolerate intense rehabilitation      Barriers to Discharge        Equipment Recommendations  Rolling walker with 5" wheels    Recommendations for Other Services     Frequency      Precautions / Restrictions Precautions Precaution Comments: wound vac on right foot Restrictions Weight Bearing Restrictions: Yes Other Position/Activity Restrictions: weight bear on the right heel and lateral surface of the foot to minimize any pressure on the forefoot   Pertinent Vitals/Pain       Mobility  Bed Mobility Bed Mobility: Supine to Sit;Sit to Supine Supine to Sit: 6: Modified independent (Device/Increase time) Sit to Supine: 6: Modified independent (Device/Increase time) Transfers Transfers: Sit to Stand;Stand to Sit Sit  to Stand: 6: Modified independent (Device/Increase time);From bed;From toilet Stand to Sit: 6: Modified independent (Device/Increase time);To bed;To toilet Ambulation/Gait Ambulation/Gait Assistance: 6: Modified independent (Device/Increase time) Ambulation Distance (Feet): 200 Feet Assistive device: Rolling walker Gait Pattern: Within Functional Limits Gait velocity: WNL Stairs: Yes Stairs Assistance: 4: Min assist Stairs Assistance Details (indicate cue type and reason): pt advised to have someone with him to assist on steps Stair Management Technique: Backwards;Forwards;With walker Number of Stairs: 3 Wheelchair Mobility Wheelchair Mobility: No    Exercises     PT Diagnosis:    PT Problem List:   PT Treatment Interventions:       PT Goals(Current goals can be found in the care plan section) Acute Rehab PT Goals PT Goal Formulation: No goals set, d/c therapy  Visit Information  Last PT Received On: 07/18/13 History of Present Illness: Pt is admitted for osteomyelitis of the right great toe for which he had an amputation on 07-14-13.  He now has a wound vac on the resulting wound.  He has DM and end stage renal failure and is on HD 3x/week.  He had been totally independent at home PTA.       Prior Functioning  Home Living Family/patient expects to be discharged to:: Private residence Living Arrangements: Spouse/significant other Available Help at Discharge: Family;Available 24 hours/day Type of Home: House Home Access: Stairs to enter [employer] of Steps: 3 Entrance  Stairs-Rails: None Home Layout: One level Home Equipment: None Prior Function Level of Independence: Independent Communication Communication: No difficulties    Cognition  Cognition Arousal/Alertness: Awake/alert Behavior During Therapy: WFL for tasks assessed/performed Overall Cognitive Status: Within Functional Limits for tasks assessed    Extremity/Trunk Assessment Lower Extremity  Assessment Lower Extremity Assessment: Overall WFL for tasks assessed   Balance Balance Balance Assessed: No (WNL by funcitonal observation)  End of Session PT - End of Session Equipment Utilized During Treatment: Gait belt Activity Tolerance: Patient tolerated treatment well Patient left: in chair;with call bell/phone within reach Nurse Communication: Mobility status  GP     Konrad Penta 07/18/2013, 9:46 AM

## 2013-07-18 NOTE — Progress Notes (Signed)
Pt & his wife verbalize understanding of d/c instructions, medications and follow up appts. [facility] has been set up and is expecting pt to be d/c home this afternoon. IV d/c. Dressing on toe changed to wet-dry drsg for transport home. Pt has no questions at this time. Pt d/c via wheelchair accompanied by staff member. Sheryn Bison

## 2013-07-18 NOTE — Progress Notes (Signed)
ANTICOAGULATION CONSULT NOTE  Pharmacy Consult for Coumadin Indication: atrial fibrillation  Allergies  Allergen Reactions  . Norvasc [Amlodipine Besylate]    Patient Measurements: Height: 6\' 4"  (193 cm) Weight:  (bedscale not "zeroed" weight was 89.1kg) IBW/kg (Calculated) : 86.8  Vital Signs: Temp: 98.1 F (36.7 C) (11/21 0558) Temp src: Oral (11/21 0558) BP: 116/86 mmHg (11/21 0558) Pulse Rate: 93 (11/21 0558)  Labs:  Recent Labs  07/16/13 0606 07/17/13 0616 07/18/13 0606  HGB 9.5* 10.9*  --   HCT 29.1* 33.3*  --   PLT 258 309  --   LABPROT 18.5* 19.7* 23.3*  INR 1.59* 1.72* 2.15*  CREATININE 7.38* 9.53*  --    Estimated Creatinine Clearance: 10.5 ml/min (by C-G formula based on Cr of 9.53).  Medical History: Past Medical History  Diagnosis Date  . UGI bleed 02/12/11    on anticoagulation; EGD w/snare polypectomy-multiple polypoid lesions antrum(benign), Chronic active gastritis (NEGATIVE H pylori)  . Anemia, chronic disease   . Sepsis due to enterococcus 02/11/11  . Atrial fibrillation     coumadin  . CAD (coronary artery disease)     s/p CABGx4  . Cardiomyopathy     40-45% EF 6/12  . Diabetes mellitus   . Hypertension   . Hyperlipemia   . Gout   . S/P patent foramen ovale closure   . History of nuclear stress test 11/2011    negative myoview  . End stage kidney disease     T/TH/SAT dialyis, Dr Fausto Skillern  . Gout   . History of nuclear stress test 07/04/2010    dipyridamole; mod fixed inferolateral defect; non-diagnostic for ischemia; low risk scan    Medications:  Prescriptions prior to admission  Medication Sig Dispense Refill  . allopurinol (ZYLOPRIM) 100 MG tablet Take 100 mg by mouth daily.       Marland Kitchen amiodarone (PACERONE) 200 MG tablet Take 100 mg by mouth daily.       Marland Kitchen aspirin 81 MG tablet Take 81 mg by mouth daily.        Marland Kitchen atorvastatin (LIPITOR) 20 MG tablet Take 1 tablet (20 mg total) by mouth daily.  30 tablet  11  . B Complex-C-Zn-Folic  Acid (DIALYVITE/ZINC PO) Take by mouth 3 (three) times [name] week.       . folic acid (FOLVITE) 1 MG tablet Take 1 mg by mouth daily.        Marland Kitchen glimepiride (AMARYL) 4 MG tablet Take 4 mg by mouth 2 (two) times daily.        Marland Kitchen LANTUS SOLOSTAR 100 UNIT/ML injection Inject 38 Units into the skin at bedtime.       Marland Kitchen lubiprostone (AMITIZA) 8 MCG capsule Take 1 capsule (8 mcg total) by mouth 2 (two) times daily with [name] meal.  60 capsule  3  . metoprolol (LOPRESSOR) 50 MG tablet Take 50 mg by mouth 2 (two) times daily.       . Multiple Vitamin (MULTIVITAMIN) capsule Take 1 capsule by mouth daily.      . pantoprazole (PROTONIX) 40 MG tablet Take 40 mg by mouth 2 (two) times daily.       Marland Kitchen RENVELA 800 MG tablet Take 800 mg by mouth 3 (three) times daily with meals.       . torsemide (DEMADEX) 20 MG tablet Take 20 mg by mouth 2 (two) times daily.      . traZODone (DESYREL) 50 MG tablet Take 50 mg by mouth at bedtime.      Marland Kitchen  warfarin (COUMADIN) 2.5 MG tablet Take 1.25 mg by mouth daily.      . digoxin (LANOXIN) 0.125 MG tablet Take 0.125 mg by mouth daily.       Assessment: 62yo male on chronic Coumadin PTA for h/o afib.  Home dose clarified as 1.25mg  daily.   Pt did not receive any Coumadin from 11/11 thru 11/18 due to elevated INR initially then plans for surgery (amputation).  Coumadin resumed 11/18.  INR is now therapeutic.  Pt is also on amiodarone and aspirin.  Goal of Therapy:  INR 2-3   Plan:  D/C SQ heparin  Coumadin 2 mg po today x 1 INR daily  [name] [name] 07/18/2013,8:24 AM

## 2013-07-18 NOTE — Progress Notes (Signed)
Subjective: Interval History: . Patient appetite is good and he doesn't have any nausea or vomiting. Presently patient offers no complaints.   Objective: Vital signs in last 24 hours: Temp:  [98.1 F (36.7 C)-98.4 F (36.9 C)] 98.1 F (36.7 C) (11/21 0558) Pulse Rate:  [81-101] 93 (11/21 0558) Resp:  [16-22] 16 (11/21 0558) BP: (111-149)/(53-86) 116/86 mmHg (11/21 0558) SpO2:  [93 %-97 %] 93 % (11/21 0558) Weight change:   Intake/Output from previous day: 11/20 0701 - 11/21 0700 In: 120 [P.O.:120] Out: 2100 [Urine:200] Intake/Output this shift:    Generally he is alert and apparent distress Chest is clear to auscultation Heart exam revealed a regular rate and rhythm 2/6 systolic ejection murmur. Abdomen soft positive bowel sound Extremities he has trace edema.  Lab Results:  Recent Labs  07/16/13 0606 07/17/13 0616  WBC 12.5* 12.6*  HGB 9.5* 10.9*  HCT 29.1* 33.3*  PLT 258 309   BMET:   Recent Labs  07/16/13 0606 07/17/13 0616  NA 136 137  K 4.3 4.9  CL 96 95*  CO2 28 26  GLUCOSE 152* 155*  BUN 33* 49*  CREATININE 7.38* 9.53*  CALCIUM 10.1 10.9*   No results found for this basename: PTH,  in the last 72 hours Iron Studies: No results found for this basename: IRON, TIBC, TRANSFERRIN, FERRITIN,  in the last 72 hours  Studies/Results: No results found.  I have reviewed the patient's current medications.  Assessment/Plan: Problem #1 end-stage renal disease is status post hemodialysis the day byesterday. Presently patient does not  have uremic sign and symptoms. His potassium is normal. Problem #2 diabetes Problem #3 CAD Problem #4 anemia his hemoglobin and hematocrit is with  in target range. Patient is on Epogen. Problem #5 osteomyelitis of his right toe. S/P amputation Problem #6 history of peripheral vascular disease Problem #7 metabolic bone disease his calcium and phosphorus was in acceptable range. Problem #8 history of chronic  hypotension. Problem #9 history of a trial fibrillation. His heart rate is controlled. Plan: Will dialyze tomorrow which is his regular schedule.           We'll we'll follow patient on dialysis when his discharged.   LOS: 11 days   Emmali Karow S 07/18/2013,7:37 AM

## 2013-08-05 ENCOUNTER — Encounter (HOSPITAL_COMMUNITY): Payer: Self-pay | Admitting: Emergency Medicine

## 2013-08-05 ENCOUNTER — Emergency Department (HOSPITAL_COMMUNITY): Payer: Medicare Other

## 2013-08-05 ENCOUNTER — Inpatient Hospital Stay (HOSPITAL_COMMUNITY)
Admission: EM | Admit: 2013-08-05 | Discharge: 2013-08-13 | DRG: 853 | Disposition: A | Discharge status: Skilled Nursing Facility | Payer: Medicare Other | Attending: Family Medicine | Admitting: Family Medicine

## 2013-08-05 DIAGNOSIS — E1122 Type 2 diabetes mellitus with diabetic chronic kidney disease: Secondary | ICD-10-CM | POA: Diagnosis present

## 2013-08-05 DIAGNOSIS — Z7901 Long term (current) use of anticoagulants: Secondary | ICD-10-CM

## 2013-08-05 DIAGNOSIS — J189 Pneumonia, unspecified organism: Secondary | ICD-10-CM | POA: Diagnosis not present

## 2013-08-05 DIAGNOSIS — Z7982 Long term (current) use of aspirin: Secondary | ICD-10-CM

## 2013-08-05 DIAGNOSIS — M9080 Osteopathy in diseases classified elsewhere, unspecified site: Secondary | ICD-10-CM | POA: Diagnosis present

## 2013-08-05 DIAGNOSIS — S98119A Complete traumatic amputation of unspecified great toe, initial encounter: Secondary | ICD-10-CM

## 2013-08-05 DIAGNOSIS — E785 Hyperlipidemia, unspecified: Secondary | ICD-10-CM | POA: Diagnosis present

## 2013-08-05 DIAGNOSIS — A419 Sepsis, unspecified organism: Principal | ICD-10-CM | POA: Diagnosis present

## 2013-08-05 DIAGNOSIS — Z992 Dependence on renal dialysis: Secondary | ICD-10-CM

## 2013-08-05 DIAGNOSIS — E872 Acidosis, unspecified: Secondary | ICD-10-CM | POA: Diagnosis present

## 2013-08-05 DIAGNOSIS — M86179 Other acute osteomyelitis, unspecified ankle and foot: Secondary | ICD-10-CM | POA: Diagnosis present

## 2013-08-05 DIAGNOSIS — N186 End stage renal disease: Secondary | ICD-10-CM | POA: Diagnosis present

## 2013-08-05 DIAGNOSIS — I2589 Other forms of chronic ischemic heart disease: Secondary | ICD-10-CM | POA: Diagnosis present

## 2013-08-05 DIAGNOSIS — M869 Osteomyelitis, unspecified: Secondary | ICD-10-CM | POA: Diagnosis present

## 2013-08-05 DIAGNOSIS — E1169 Type 2 diabetes mellitus with other specified complication: Secondary | ICD-10-CM | POA: Diagnosis present

## 2013-08-05 DIAGNOSIS — M86171 Other acute osteomyelitis, right ankle and foot: Secondary | ICD-10-CM

## 2013-08-05 DIAGNOSIS — I70209 Unspecified atherosclerosis of native arteries of extremities, unspecified extremity: Secondary | ICD-10-CM | POA: Diagnosis present

## 2013-08-05 DIAGNOSIS — Z87891 Personal history of nicotine dependence: Secondary | ICD-10-CM

## 2013-08-05 DIAGNOSIS — R17 Unspecified jaundice: Secondary | ICD-10-CM | POA: Diagnosis present

## 2013-08-05 DIAGNOSIS — I959 Hypotension, unspecified: Secondary | ICD-10-CM | POA: Diagnosis present

## 2013-08-05 DIAGNOSIS — I70269 Atherosclerosis of native arteries of extremities with gangrene, unspecified extremity: Secondary | ICD-10-CM | POA: Diagnosis present

## 2013-08-05 DIAGNOSIS — E11649 Type 2 diabetes mellitus with hypoglycemia without coma: Secondary | ICD-10-CM | POA: Diagnosis not present

## 2013-08-05 DIAGNOSIS — I2510 Atherosclerotic heart disease of native coronary artery without angina pectoris: Secondary | ICD-10-CM | POA: Diagnosis present

## 2013-08-05 DIAGNOSIS — L02619 Cutaneous abscess of unspecified foot: Secondary | ICD-10-CM | POA: Diagnosis present

## 2013-08-05 DIAGNOSIS — Z951 Presence of aortocoronary bypass graft: Secondary | ICD-10-CM

## 2013-08-05 DIAGNOSIS — I120 Hypertensive chronic kidney disease with stage 5 chronic kidney disease or end stage renal disease: Secondary | ICD-10-CM | POA: Diagnosis present

## 2013-08-05 DIAGNOSIS — D638 Anemia in other chronic diseases classified elsewhere: Secondary | ICD-10-CM | POA: Diagnosis present

## 2013-08-05 DIAGNOSIS — M109 Gout, unspecified: Secondary | ICD-10-CM | POA: Diagnosis present

## 2013-08-05 DIAGNOSIS — Z794 Long term (current) use of insulin: Secondary | ICD-10-CM

## 2013-08-05 DIAGNOSIS — Z833 Family history of diabetes mellitus: Secondary | ICD-10-CM

## 2013-08-05 DIAGNOSIS — I4891 Unspecified atrial fibrillation: Secondary | ICD-10-CM | POA: Diagnosis present

## 2013-08-05 DIAGNOSIS — Z954 Presence of other heart-valve replacement: Secondary | ICD-10-CM

## 2013-08-05 HISTORY — DX: Other disorders of bilirubin metabolism: E80.6

## 2013-08-05 LAB — GLUCOSE, CAPILLARY: Glucose-Capillary: 109 mg/dL — ABNORMAL HIGH (ref 70–99)

## 2013-08-05 LAB — BASIC METABOLIC PANEL
CO2: 24 mEq/L (ref 19–32)
Chloride: 94 mEq/L — ABNORMAL LOW (ref 96–112)
Creatinine, Ser: 5.45 mg/dL — ABNORMAL HIGH (ref 0.50–1.35)
GFR calc Af Amer: 12 mL/min — ABNORMAL LOW (ref >90)
GFR calc non Af Amer: 10 mL/min — ABNORMAL LOW (ref >90)
Potassium: 3.6 mEq/L (ref 3.5–5.1)

## 2013-08-05 LAB — CBC WITH DIFFERENTIAL/PLATELET
Basophils Absolute: 0 10*3/uL (ref 0.0–0.1)
Basophils Relative: 0 % (ref 0–1)
Eosinophils Absolute: 0 10*3/uL (ref 0.0–0.7)
HCT: 24.6 % — ABNORMAL LOW (ref 39.0–52.0)
Lymphocytes Relative: 3 % — ABNORMAL LOW (ref 12–46)
MCH: 30.1 pg (ref 26.0–34.0)
MCHC: 32.5 g/dL (ref 30.0–36.0)
Monocytes Absolute: 1.4 10*3/uL — ABNORMAL HIGH (ref 0.1–1.0)
Neutro Abs: 17.3 10*3/uL — ABNORMAL HIGH (ref 1.7–7.7)
Neutrophils Relative %: 89 % — ABNORMAL HIGH (ref 43–77)
Platelets: 259 10*3/uL (ref 150–400)
RDW: 15.2 % (ref 11.5–15.5)

## 2013-08-05 LAB — PROTIME-INR: INR: 1.71 — ABNORMAL HIGH (ref 0.00–1.49)

## 2013-08-05 MED ORDER — VANCOMYCIN HCL IN DEXTROSE 1-5 GM/200ML-% IV SOLN
1000.0000 mg | INTRAVENOUS | Status: DC
  Administered 2013-08-07 – 2013-08-12 (×3): 1000 mg via INTRAVENOUS
  Filled 2013-08-05 (×4): qty 200

## 2013-08-05 MED ORDER — ACETAMINOPHEN 325 MG PO TABS
650.0000 mg | ORAL_TABLET | Freq: Four times a day (QID) | ORAL | Status: DC | PRN
  Administered 2013-08-08: 650 mg via ORAL
  Filled 2013-08-05: qty 2

## 2013-08-05 MED ORDER — ACETAMINOPHEN 325 MG PO TABS
650.0000 mg | ORAL_TABLET | Freq: Once | ORAL | Status: AC
  Administered 2013-08-05: 650 mg via ORAL

## 2013-08-05 MED ORDER — ATORVASTATIN CALCIUM 20 MG PO TABS
20.0000 mg | ORAL_TABLET | Freq: Every day | ORAL | Status: DC
  Administered 2013-08-05 – 2013-08-13 (×9): 20 mg via ORAL
  Filled 2013-08-05 (×9): qty 1

## 2013-08-05 MED ORDER — SODIUM CHLORIDE 0.9 % IV SOLN
250.0000 mL | INTRAVENOUS | Status: DC | PRN
  Administered 2013-08-08: 14:00:00 via INTRAVENOUS

## 2013-08-05 MED ORDER — ONDANSETRON HCL 4 MG/2ML IJ SOLN
4.0000 mg | Freq: Four times a day (QID) | INTRAMUSCULAR | Status: DC | PRN
  Administered 2013-08-06: 4 mg via INTRAVENOUS
  Filled 2013-08-05: qty 2

## 2013-08-05 MED ORDER — VANCOMYCIN HCL 10 G IV SOLR
1500.0000 mg | Freq: Once | INTRAVENOUS | Status: DC
  Filled 2013-08-05: qty 1500

## 2013-08-05 MED ORDER — TRAZODONE HCL 50 MG PO TABS
50.0000 mg | ORAL_TABLET | Freq: Every day | ORAL | Status: DC
  Administered 2013-08-05 – 2013-08-12 (×8): 50 mg via ORAL
  Filled 2013-08-05 (×8): qty 1

## 2013-08-05 MED ORDER — SEVELAMER CARBONATE 800 MG PO TABS
800.0000 mg | ORAL_TABLET | Freq: Three times a day (TID) | ORAL | Status: DC
  Administered 2013-08-05 – 2013-08-06 (×4): 800 mg via ORAL
  Filled 2013-08-05 (×4): qty 1

## 2013-08-05 MED ORDER — MORPHINE SULFATE 2 MG/ML IJ SOLN
1.0000 mg | INTRAMUSCULAR | Status: DC | PRN
  Administered 2013-08-08: 1 mg via INTRAVENOUS
  Filled 2013-08-05: qty 1

## 2013-08-05 MED ORDER — ADULT MULTIVITAMIN W/MINERALS CH
1.0000 | ORAL_TABLET | Freq: Every day | ORAL | Status: DC
  Administered 2013-08-05 – 2013-08-13 (×7): 1 via ORAL
  Filled 2013-08-05 (×7): qty 1

## 2013-08-05 MED ORDER — CEFTAZIDIME 2 G IJ SOLR
2.0000 g | INTRAMUSCULAR | Status: DC
  Filled 2013-08-05: qty 2

## 2013-08-05 MED ORDER — INSULIN GLARGINE 100 UNIT/ML ~~LOC~~ SOLN
38.0000 [IU] | Freq: Every day | SUBCUTANEOUS | Status: DC
  Administered 2013-08-05: 38 [IU] via SUBCUTANEOUS
  Filled 2013-08-05 (×2): qty 0.38

## 2013-08-05 MED ORDER — ONDANSETRON HCL 4 MG PO TABS: 4.0000 mg | ORAL_TABLET | Freq: Four times a day (QID) | ORAL | Status: DC | PRN

## 2013-08-05 MED ORDER — INSULIN ASPART 100 UNIT/ML ~~LOC~~ SOLN
0.0000 [IU] | Freq: Three times a day (TID) | SUBCUTANEOUS | Status: DC
  Administered 2013-08-07: 1 [IU] via SUBCUTANEOUS
  Administered 2013-08-07: 2 [IU] via SUBCUTANEOUS
  Administered 2013-08-07 – 2013-08-08 (×2): 1 [IU] via SUBCUTANEOUS
  Administered 2013-08-09: 3 [IU] via SUBCUTANEOUS
  Administered 2013-08-09: 1 [IU] via SUBCUTANEOUS
  Administered 2013-08-10: 3 [IU] via SUBCUTANEOUS
  Administered 2013-08-10: 1 [IU] via SUBCUTANEOUS
  Administered 2013-08-10 – 2013-08-11 (×4): 2 [IU] via SUBCUTANEOUS
  Administered 2013-08-12: 1 [IU] via SUBCUTANEOUS
  Administered 2013-08-12: 2 [IU] via SUBCUTANEOUS

## 2013-08-05 MED ORDER — FOLIC ACID 1 MG PO TABS
1.0000 mg | ORAL_TABLET | Freq: Every day | ORAL | Status: DC
  Administered 2013-08-05 – 2013-08-13 (×7): 1 mg via ORAL
  Filled 2013-08-05 (×7): qty 1

## 2013-08-05 MED ORDER — ACETAMINOPHEN 650 MG RE SUPP: 650.0000 mg | Freq: Four times a day (QID) | RECTAL | Status: DC | PRN

## 2013-08-05 MED ORDER — SODIUM CHLORIDE 0.9 % IJ SOLN: 3.0000 mL | INTRAMUSCULAR | Status: DC | PRN

## 2013-08-05 MED ORDER — ACETAMINOPHEN 325 MG PO TABS
ORAL_TABLET | ORAL | Status: AC
  Administered 2013-08-05: 650 mg via ORAL
  Filled 2013-08-05: qty 2

## 2013-08-05 MED ORDER — DEXTROSE 5 % IV SOLN
2.0000 g | Freq: Once | INTRAVENOUS | Status: AC
  Administered 2013-08-05: 2 g via INTRAVENOUS
  Filled 2013-08-05: qty 2

## 2013-08-05 MED ORDER — INSULIN ASPART 100 UNIT/ML ~~LOC~~ SOLN: 3.0000 [IU] | Freq: Three times a day (TID) | SUBCUTANEOUS | Status: DC

## 2013-08-05 MED ORDER — DEXTROSE 5 % IV SOLN
INTRAVENOUS | Status: AC
  Filled 2013-08-05: qty 2

## 2013-08-05 MED ORDER — AMIODARONE HCL 200 MG PO TABS
100.0000 mg | ORAL_TABLET | Freq: Every day | ORAL | Status: DC
  Administered 2013-08-05 – 2013-08-08 (×4): 100 mg via ORAL
  Filled 2013-08-05 (×5): qty 1

## 2013-08-05 MED ORDER — PANTOPRAZOLE SODIUM 40 MG PO TBEC
40.0000 mg | DELAYED_RELEASE_TABLET | Freq: Two times a day (BID) | ORAL | Status: DC
  Administered 2013-08-05 – 2013-08-13 (×15): 40 mg via ORAL
  Filled 2013-08-05 (×15): qty 1

## 2013-08-05 MED ORDER — SODIUM CHLORIDE 0.9 % IJ SOLN
3.0000 mL | Freq: Two times a day (BID) | INTRAMUSCULAR | Status: DC
  Administered 2013-08-05 – 2013-08-12 (×7): 3 mL via INTRAVENOUS

## 2013-08-05 MED ORDER — SENNOSIDES-DOCUSATE SODIUM 8.6-50 MG PO TABS: 1.0000 | ORAL_TABLET | Freq: Every evening | ORAL | Status: DC | PRN

## 2013-08-05 NOTE — Consult Note (Signed)
Reason for Consult: Worsening gangrene of right foot, hypotension Referring Physician: [facility]  [name] [name] is an 62 y.o. male.  HPI: Patient is a 62 year old black male with multiple medical problems including atrial fibrillation, end-stage renal disease, chronic anticoagulation, status post a right great toe amputation by myself recently who presented to the emergency room with hypotension after undergoing dialysis. I'd seen in my office last week and I told the patient that he was developing worsening gangrene of multiple right toes, with some soft tissue cyanosis of the right foot. He refused surgical intervention at that time. He was supposed to see me in my office later this week. He apparently has been receiving vancomycin while on dialysis.  Past Medical History  Diagnosis Date  . UGI bleed 02/12/11    on anticoagulation; EGD w/snare polypectomy-multiple polypoid lesions antrum(benign), Chronic active gastritis (NEGATIVE H pylori)  . Anemia, chronic disease   . Sepsis due to enterococcus 02/11/11  . Atrial fibrillation     coumadin  . CAD (coronary artery disease)     s/p CABGx4  . Cardiomyopathy     40-45% EF 6/12  . Diabetes mellitus   . Hypertension   . Hyperlipemia   . Gout   . S/P patent foramen ovale closure   . History of nuclear stress test 11/2011    negative myoview  . End stage kidney disease     T/TH/SAT dialyis, Dr Fausto Skillern  . Gout   . History of nuclear stress test 07/04/2010    dipyridamole; mod fixed inferolateral defect; non-diagnostic for ischemia; low risk scan     Past Surgical History  Procedure Laterality Date  . Cardiac catheterization  12/31/2006    severe diffuse 3 vessel CAD, severe 4+ MR (Dr. Evlyn Courier)  . Mitral valve replacement  01/05/2007    bioprosthetic 31mm Edwards precordial tissue (Dr. Tyrone Sage)  . Coronary artery bypass graft  01/05/2007    x4; LIMA-LAD, SVG-OM, seq. SVG-PDA, PLA-RCA  . Colonoscopy  06/23/2011    Dr. Darrick Penna:  multiple sessile and pedunculated polyps, moderate diverticulosis, internal hemorrhoids, +ADENOMATOUS POLYPS, consider genetic testing, surveillance in October 2015   . Esophagogastroduodenoscopy  02/12/2011    CHRONIC ACTIVE GASTRITIS, NO H. pylori  . Transthoracic echocardiogram  11/2011    EF 25% w/ mod dilatation of LV & mod LVH; LA severely dilated; mod-severe dilation of RV with mod-severe decrease in RV function; mild MR & TR  . Cardioversion  04/19/2007    Dr. Jonette Eva  . Transthoracic echocardiogram  02/14/2011    EF 40-45%, mod conc LVH; ventricular septum with motion showing abnormal function, dyssynergy, paradox; mild AS; mild-mod MR stenosis; LA severely dilated; aptrial septum bowed from L to R with increased LA pressure   . Amputation Right 07/14/2013    Procedure: AMPUTATION DIGIT;  Surgeon: Dalia Heading, MD;  Location: [facility];  Service: General;  Laterality: Right;    Family History  Problem Relation Age of Onset  . Diabetes Mother   . Diabetes Father   . Diabetes Sister   . Colon cancer Neg Hx     Social History:  reports that he quit smoking about 6 years ago. His smoking use included Cigarettes. He smoked 1.00 pack per day. He does not have any smokeless tobacco history on file. He reports that he does not drink alcohol or use illicit drugs.  Allergies:  Allergies  Allergen Reactions  . Bacitracin Other (See Comments)    unknown  . Norvasc [  Amlodipine Besylate] Other (See Comments)    unknown    Medications: I have reviewed the patient's current medications.  Results for orders placed during the hospital encounter of 08/05/13 (from the past 48 hour(s))  CBC WITH DIFFERENTIAL     Status: Abnormal   Collection Time    08/05/13  1:48 PM      Result Value Range   WBC 19.4 (*) 4.0 - 10.5 K/uL   RBC 2.66 (*) 4.22 - 5.81 MIL/uL   Hemoglobin 8.0 (*) 13.0 - 17.0 g/dL   HCT 66.4 (*) 40.3 - 47.4 %   MCV 92.5  78.0 - 100.0 fL   MCH 30.1  26.0 - 34.0 pg   MCHC  32.5  30.0 - 36.0 g/dL   RDW 25.9  56.3 - 87.5 %   Platelets 259  150 - 400 K/uL   Neutrophils Relative % 89 (*) 43 - 77 %   Neutro Abs 17.3 (*) 1.7 - 7.7 K/uL   Lymphocytes Relative 3 (*) 12 - 46 %   Lymphs Abs 0.6 (*) 0.7 - 4.0 K/uL   Monocytes Relative 7  3 - 12 %   Monocytes Absolute 1.4 (*) 0.1 - 1.0 K/uL   Eosinophils Relative 0  0 - 5 %   Eosinophils Absolute 0.0  0.0 - 0.7 K/uL   Basophils Relative 0  0 - 1 %   Basophils Absolute 0.0  0.0 - 0.1 K/uL  BASIC METABOLIC PANEL     Status: Abnormal   Collection Time    08/05/13  1:48 PM      Result Value Range   Sodium 137  135 - 145 mEq/L   Potassium 3.6  3.5 - 5.1 mEq/L   Chloride 94 (*) 96 - 112 mEq/L   CO2 24  19 - 32 mEq/L   Glucose, Bld 98  70 - 99 mg/dL   BUN 22  6 - 23 mg/dL   Creatinine, Ser 6.43 (*) 0.50 - 1.35 mg/dL   Calcium 9.4  8.4 - 32.9 mg/dL   GFR calc non Af Amer 10 (*) >90 mL/min   GFR calc Af Amer 12 (*) >90 mL/min   Comment: (NOTE)     The eGFR has been calculated using the CKD EPI equation.     This calculation has not been validated in all clinical situations.     eGFR's persistently <90 mL/min signify possible Chronic Kidney     Disease.  LACTIC ACID, PLASMA     Status: Abnormal   Collection Time    08/05/13  3:05 PM      Result Value Range   Lactic Acid, Venous 2.8 (*) 0.5 - 2.2 mmol/L    Dg Foot 2 Views Right  08/05/2013   CLINICAL DATA:  History of ulcer involving the 1st digit findings performed evaluate for changes secondary to osteomyelitis  EXAM: RIGHT FOOT - 2 VIEW  COMPARISON:  Great toe evaluation 07/07/2013  FINDINGS: There is subcutaneous emphysema and ulceration involving the soft tissues overlying the head of the 1st metatarsal. Cortical destruction is identified along the medial and lateral periphery of the head of the 1st metatarsal. A drainage catheter likely representing a wound VAC is appreciated within the ulcer. Atherosclerotic calcification appreciated. There is osteopenia which  may represent hyperemia considering the findings. There is no evidence of acute fracture nor dislocation.  IMPRESSION: Findings consistent with osteomyelitis involving the head of the 1st metatarsal until proven otherwise. Dr. Effie Shy of the [facility]  emergency department was informed of these findings at the time of initial interpretation via telephone conversation.   Electronically Signed   By: Salome Holmes M.D.   On: 08/05/2013 15:46    ROS: See chart Blood pressure 107/63, pulse 100, temperature 100.7 F (38.2 C), temperature source Oral, resp. rate 22, height 6\' 4"  (1.93 m), weight 105.235 kg (232 lb), SpO2 99.00%. Physical Exam: Alert and oriented, with more stable blood pressure and pulse at this time. Extremity examination reveals a palpable right femoral pulse. A wound VAC is in place over the right great toe amputation site. Multiple digits on her necrotic with subcutaneous necrosis noted along the dorsum of the right foot. The right foot is swollen with some areas of subcutaneous soft tissue necrosis around the ankle region. The erythema extends up to the distal aspect of the right lower extremity. No palpable pulses are identified.  Assessment/Plan: Impression: Progressive or for vascular disease secondary to diabetes mellitus, right foot, status post right great toe amputation. He is developed worsening gangrenous/ischemic changes in the right foot. Unfortunately do to the worsening ischemia of the right lower extremity, he will need a below-the-knee amputation. He has known at this has been a possibility do to his worsening disease. He has multiple medical problems that need to be addressed prior to undergoing surgery. This has been discussed with the hospitalist. Will temporarily schedule his surgery for 08/08/2013. Will do the surgery sooner if needed. The risks and benefits of the procedure were fully explained to the patient and wife, who gave informed consent.  Lily Kernen  A 08/05/2013, 5:42 PM

## 2013-08-05 NOTE — Progress Notes (Signed)
ANTIBIOTIC CONSULT NOTE - INITIAL  Pharmacy Consult for Vancomycin and Fortaz Indication: Osteomyelitis Right Foot  Allergies  Allergen Reactions  . Bacitracin Other (See Comments)    unknown  . Norvasc [Amlodipine Besylate] Other (See Comments)    unknown    Patient Measurements: Height: 6\' 4"  (193 cm) Weight: 229 lb 15 oz (104.3 kg) IBW/kg (Calculated) : 86.8  Vital Signs: Temp: 99.3 F (37.4 C) (12/09 1814) Temp src: Oral (12/09 1814) BP: 82/43 mmHg (12/09 1814) Pulse Rate: 101 (12/09 1814) Intake/Output from previous day:   Intake/Output from this shift:    Labs:  Recent Labs  08/05/13 1348  WBC 19.4*  HGB 8.0*  PLT 259  CREATININE 5.45*   Estimated Creatinine Clearance: 18.2 ml/min (by C-G formula based on Cr of 5.45). No results found for this basename: VANCOTROUGH, Leodis Binet, VANCORANDOM, GENTTROUGH, GENTPEAK, GENTRANDOM, TOBRATROUGH, TOBRAPEAK, TOBRARND, AMIKACINPEAK, AMIKACINTROU, AMIKACIN,  in the last 72 hours   Microbiology: Recent Results (from the past 720 hour(s))  CULTURE, BLOOD (ROUTINE X 2)     Status: None   Collection Time    07/07/13  8:20 PM      Result Value Range Status   Specimen Description BLOOD LEFT ANTECUBITAL   Final   Special Requests BOTTLES DRAWN AEROBIC AND ANAEROBIC 10CC EACH   Final   Culture NO GROWTH 5 DAYS   Final   Report Status 07/12/2013 FINAL   Final  CULTURE, BLOOD (ROUTINE X 2)     Status: None   Collection Time    07/07/13  8:25 PM      Result Value Range Status   Specimen Description BLOOD LEFT WRIST   Final   Special Requests BOTTLES DRAWN AEROBIC AND ANAEROBIC 8CC EACH   Final   Culture NO GROWTH 5 DAYS   Final   Report Status 07/12/2013 FINAL   Final  MRSA PCR SCREENING     Status: None   Collection Time    07/08/13  7:00 PM      Result Value Range Status   MRSA by PCR NEGATIVE  NEGATIVE Final   Comment:            The GeneXpert MRSA Assay (FDA     approved for NASAL specimens     only), is one  component of a     comprehensive MRSA colonization     surveillance program. It is not     intended to diagnose MRSA     infection nor to guide or     monitor treatment for     MRSA infections.    Medical History: Past Medical History  Diagnosis Date  . UGI bleed 02/12/11    on anticoagulation; EGD w/snare polypectomy-multiple polypoid lesions antrum(benign), Chronic active gastritis (NEGATIVE H pylori)  . Anemia, chronic disease   . Sepsis due to enterococcus 02/11/11  . Atrial fibrillation     coumadin  . CAD (coronary artery disease)     s/p CABGx4  . Cardiomyopathy     40-45% EF 6/12  . Diabetes mellitus   . Hypertension   . Hyperlipemia   . Gout   . S/P patent foramen ovale closure   . History of nuclear stress test 11/2011    negative myoview  . End stage kidney disease     T/TH/SAT dialyis, Dr Fausto Skillern  . Gout   . History of nuclear stress test 07/04/2010    dipyridamole; mod fixed inferolateral defect; non-diagnostic for ischemia; low risk scan  Medications:  Scheduled:  . amiodarone  100 mg Oral Daily  . atorvastatin  20 mg Oral q1800  . cefTAZidime (FORTAZ)  IV  2 g Intravenous Once  . folic acid  1 mg Oral Daily  . [START ON 08/06/2013] insulin aspart  0-9 Units Subcutaneous TID WC  . [START ON 08/06/2013] insulin aspart  3 Units Subcutaneous TID WC  . insulin glargine  38 Units Subcutaneous QHS  . multivitamin with minerals  1 tablet Oral Daily  . pantoprazole  40 mg Oral BID  . sevelamer carbonate  800 mg Oral TID WC  . sodium chloride  3 mL Intravenous Q12H  . traZODone  50 mg Oral QHS   Assessment: Okay for Protocol, ESRD who had right big toe amputation 07/14/2013.  Now with worsening gangrene and BKA planned for later this week.  Patient received 1500mg  IV Vancomycin at dialysis center today per RN.   Has not received any [place] today per RN.  Goal of Therapy:  Pre-Hemodialysis Vancomycin level goal range =15-25 mcg/ml Eradicate  infection.  Plan:  Vancomycin 1000mg  IV every HD. Fortaz 2gm IV tonight and then every HD. F/U Micro data and vitals. Vancomycin levels as clinically indicated.  Lamonte Richer R 08/05/2013,6:39 PM

## 2013-08-05 NOTE — H&P (Addendum)
[facility]          History and Physical    PCP:   Cassell Smiles., MD   Chief Complaint:  Hypotension after dialysis  HPI: Patient is a 62 year old African American man with a past medical history significant for end-stage renal disease on hemodialysis Tuesday, Thursday, Saturday, atrial fibrillation maintained on chronic anticoagulation with Coumadin, prior osteomyelitis of the right big toe status post amputation on 07/14/2013, currently with a wound VAC and receiving vancomycin and ceftaz edema with dialysis, diabetes mellitus. He presents to the hospital today with a blood pressure of 90/72 and noticed immediately following dialysis. They did administer 400 cc of saline and brought him to the hospital for evaluation. In the hospital he was found to have a temperature of 100.7, a lactic acid of 2.8, WBC count of 19.4, as well as a foot x-ray that shows continued osteomyelitis of the first metatarsal. EDP has discussed with Dr. Lovell Sheehan, surgery who has requested admission for amputation tomorrow morning. We have been asked to admit him for further evaluation and management.  Allergies:   Allergies  Allergen Reactions  . Bacitracin Other (See Comments)    unknown  . Norvasc [Amlodipine Besylate] Other (See Comments)    unknown      Past Medical History  Diagnosis Date  . UGI bleed 02/12/11    on anticoagulation; EGD w/snare polypectomy-multiple polypoid lesions antrum(benign), Chronic active gastritis (NEGATIVE H pylori)  . Anemia, chronic disease   . Sepsis due to enterococcus 02/11/11  . Atrial fibrillation     coumadin  . CAD (coronary artery disease)     s/p CABGx4  . Cardiomyopathy     40-45% EF 6/12  . Diabetes mellitus   . Hypertension   . Hyperlipemia   . Gout   . S/P patent foramen ovale closure   . History of nuclear stress test 11/2011    negative myoview  . End stage kidney disease     T/TH/SAT dialyis, Dr Fausto Skillern  . Gout   . History of  nuclear stress test 07/04/2010    dipyridamole; mod fixed inferolateral defect; non-diagnostic for ischemia; low risk scan     Past Surgical History  Procedure Laterality Date  . Cardiac catheterization  12/31/2006    severe diffuse 3 vessel CAD, severe 4+ MR (Dr. Evlyn Courier)  . Mitral valve replacement  01/05/2007    bioprosthetic 31mm Edwards precordial tissue (Dr. Tyrone Sage)  . Coronary artery bypass graft  01/05/2007    x4; LIMA-LAD, SVG-OM, seq. SVG-PDA, PLA-RCA  . Colonoscopy  06/23/2011    Dr. Darrick Penna: multiple sessile and pedunculated polyps, moderate diverticulosis, internal hemorrhoids, +ADENOMATOUS POLYPS, consider genetic testing, surveillance in October 2015   . Esophagogastroduodenoscopy  02/12/2011    CHRONIC ACTIVE GASTRITIS, NO H. pylori  . Transthoracic echocardiogram  11/2011    EF 25% w/ mod dilatation of LV & mod LVH; LA severely dilated; mod-severe dilation of RV with mod-severe decrease in RV function; mild MR & TR  . Cardioversion  04/19/2007    Dr. Jonette Eva  . Transthoracic echocardiogram  02/14/2011    EF 40-45%, mod conc LVH; ventricular septum with motion showing abnormal function, dyssynergy, paradox; mild AS; mild-mod MR stenosis; LA severely dilated; aptrial septum bowed from L to R with increased LA pressure   . Amputation Right 07/14/2013    Procedure: AMPUTATION DIGIT;  Surgeon: Dalia Heading, MD;  Location: [facility];  Service: General;  Laterality: Right;    Prior to  Admission medications   Medication Sig Start Date End Date Taking? Authorizing Provider  allopurinol (ZYLOPRIM) 100 MG tablet Take 100 mg by mouth daily.  05/15/11  Yes Historical Provider, MD  amiodarone (PACERONE) 200 MG tablet Take 100 mg by mouth daily.  04/24/11  Yes Historical Provider, MD  aspirin 81 MG tablet Take 81 mg by mouth daily.     Yes Historical Provider, MD  atorvastatin (LIPITOR) 20 MG tablet Take 1 tablet (20 mg total) by mouth daily. 02/07/13  Yes Governor Rooks, MD  B  Complex-C-Zn-Folic Acid (DIALYVITE/ZINC PO) Take by mouth 3 (three) times a week. Patient has dialysis on Tuesdays, Thursdays, and Saturdays   Yes Historical Provider, MD  diltiazem (CARDIZEM CD) 180 MG 24 hr capsule Take 1 capsule (180 mg total) by mouth daily. 07/18/13  Yes Erick Blinks, MD  folic acid (FOLVITE) 1 MG tablet Take 1 mg by mouth daily.     Yes Historical Provider, MD  glimepiride (AMARYL) 4 MG tablet Take 4 mg by mouth 2 (two) times daily.     Yes Historical Provider, MD  Insulin Glargine (LANTUS SOLOSTAR) 100 UNIT/ML SOPN Inject 38 Units into the skin at bedtime.   Yes Historical Provider, MD  metoprolol (LOPRESSOR) 100 MG tablet Take 1 tablet (100 mg total) by mouth 2 (two) times daily. 07/18/13  Yes Erick Blinks, MD  Multiple Vitamin (MULTIVITAMIN) capsule Take 1 capsule by mouth daily.   Yes Historical Provider, MD  pantoprazole (PROTONIX) 40 MG tablet Take 40 mg by mouth 2 (two) times daily.  05/25/11  Yes Historical Provider, MD  RENVELA 800 MG tablet Take 800 mg by mouth 3 (three) times daily with meals.  05/03/11  Yes Historical Provider, MD  torsemide (DEMADEX) 20 MG tablet Take 20 mg by mouth 2 (two) times daily.   Yes Historical Provider, MD  traZODone (DESYREL) 50 MG tablet Take 50 mg by mouth at bedtime.   Yes Historical Provider, MD  warfarin (COUMADIN) 2.5 MG tablet Take 1.25 mg by mouth daily.   Yes Historical Provider, MD    Social History:  reports that he quit smoking about 6 years ago. His smoking use included Cigarettes. He smoked 1.00 pack per day. He does not have any smokeless tobacco history on file. He reports that he does not drink alcohol or use illicit drugs.  Family History  Problem Relation Age of Onset  . Diabetes Mother   . Diabetes Father   . Diabetes Sister   . Colon cancer Neg Hx     Review of Systems:  Constitutional: Denies fever, chills, diaphoresis, appetite change and fatigue.  HEENT: Denies photophobia, eye pain, redness, hearing  loss, ear pain, congestion, sore throat, rhinorrhea, sneezing, mouth sores, trouble swallowing, neck pain, neck stiffness and tinnitus.   Respiratory: Denies SOB, DOE, cough, chest tightness,  and wheezing.   Cardiovascular: Denies chest pain, palpitations and leg swelling.  Gastrointestinal: Denies nausea, vomiting, abdominal pain, diarrhea, constipation, blood in stool and abdominal distention.  Genitourinary: Denies dysuria, urgency, frequency, hematuria, flank pain and difficulty urinating.  Endocrine: Denies: hot or cold intolerance, sweats, changes in hair or nails, polyuria, polydipsia. Musculoskeletal: Denies myalgias, back pain. Skin: Denies pallor, rash and wound.  Neurological: Denies dizziness, seizures, syncope, weakness, light-headedness, numbness and headaches.  Hematological: Denies adenopathy. Easy bruising, personal or family bleeding history  Psychiatric/Behavioral: Denies suicidal ideation, mood changes, confusion, nervousness, sleep disturbance and agitation   Physical Exam: Blood pressure 114/93, pulse 101, temperature 100.7 F (38.2  C), temperature source Oral, resp. rate 28, height 6\' 4"  (1.93 m), weight 105.235 kg (232 lb), SpO2 95.00%. General: Alert, awake, oriented x3, in no distress. HEENT: Normocephalic, atraumatic, pupils round reactive to light, extraocular movements intact, moist mucous membranes, poor dentition. Neck: Supple, no JVD, no lymphadenopathy, no bruits, no goiter. Cardiovascular: Regular rate and rhythm, no murmurs, rubs or gallops. Lungs: Clear to auscultation bilaterally. Abdomen: Soft, nontender, nondistended, positive bowel sounds, no masses organomegaly noted. Extremities: Left no clubbing cyanosis or edema with positive pedal pulses, right wound VAC in place, status post amputation of the right big toe, yellowish drainage. Edema extending up to about mid shin. Neurologic: Grossly intact and nonfocal  Labs on Admission:  Results for orders  placed during the hospital encounter of 08/05/13 (from the past 48 hour(s))  CBC WITH DIFFERENTIAL     Status: Abnormal   Collection Time    08/05/13  1:48 PM      Result Value Range   WBC 19.4 (*) 4.0 - 10.5 K/uL   RBC 2.66 (*) 4.22 - 5.81 MIL/uL   Hemoglobin 8.0 (*) 13.0 - 17.0 g/dL   HCT 21.3 (*) 08.6 - 57.8 %   MCV 92.5  78.0 - 100.0 fL   MCH 30.1  26.0 - 34.0 pg   MCHC 32.5  30.0 - 36.0 g/dL   RDW 46.9  62.9 - 52.8 %   Platelets 259  150 - 400 K/uL   Neutrophils Relative % 89 (*) 43 - 77 %   Neutro Abs 17.3 (*) 1.7 - 7.7 K/uL   Lymphocytes Relative 3 (*) 12 - 46 %   Lymphs Abs 0.6 (*) 0.7 - 4.0 K/uL   Monocytes Relative 7  3 - 12 %   Monocytes Absolute 1.4 (*) 0.1 - 1.0 K/uL   Eosinophils Relative 0  0 - 5 %   Eosinophils Absolute 0.0  0.0 - 0.7 K/uL   Basophils Relative 0  0 - 1 %   Basophils Absolute 0.0  0.0 - 0.1 K/uL  BASIC METABOLIC PANEL     Status: Abnormal   Collection Time    08/05/13  1:48 PM      Result Value Range   Sodium 137  135 - 145 mEq/L   Potassium 3.6  3.5 - 5.1 mEq/L   Chloride 94 (*) 96 - 112 mEq/L   CO2 24  19 - 32 mEq/L   Glucose, Bld 98  70 - 99 mg/dL   BUN 22  6 - 23 mg/dL   Creatinine, Ser 4.13 (*) 0.50 - 1.35 mg/dL   Calcium 9.4  8.4 - 24.4 mg/dL   GFR calc non Af Amer 10 (*) >90 mL/min   GFR calc Af Amer 12 (*) >90 mL/min   Comment: (NOTE)     The eGFR has been calculated using the CKD EPI equation.     This calculation has not been validated in all clinical situations.     eGFR's persistently <90 mL/min signify possible Chronic Kidney     Disease.  LACTIC ACID, PLASMA     Status: Abnormal   Collection Time    08/05/13  3:05 PM      Result Value Range   Lactic Acid, Venous 2.8 (*) 0.5 - 2.2 mmol/L    Radiological Exams on Admission: Dg Foot 2 Views Right  08/05/2013   CLINICAL DATA:  History of ulcer involving the 1st digit findings performed evaluate for changes secondary to osteomyelitis  EXAM: RIGHT FOOT -  2 VIEW  COMPARISON:   Great toe evaluation 07/07/2013  FINDINGS: There is subcutaneous emphysema and ulceration involving the soft tissues overlying the head of the 1st metatarsal. Cortical destruction is identified along the medial and lateral periphery of the head of the 1st metatarsal. A drainage catheter likely representing a wound VAC is appreciated within the ulcer. Atherosclerotic calcification appreciated. There is osteopenia which may represent hyperemia considering the findings. There is no evidence of acute fracture nor dislocation.  IMPRESSION: Findings consistent with osteomyelitis involving the head of the 1st metatarsal until proven otherwise. Dr. Effie Shy of the [facility] emergency department was informed of these findings at the time of initial interpretation via telephone conversation.   Electronically Signed   By: Salome Holmes M.D.   On: 08/05/2013 15:46    Assessment/Plan Principal Problem:   Sepsis Active Problems:   Atrial fibrillation   ESRD (end stage renal disease) on dialysis   Osteomyelitis   Hypotension   Sepsis syndrome -Secondary to osteomyelitis of right big toe. -Currently blood pressure is stable, given that and his end-stage renal disease status, we'll withhold further fluids. -Admit to step down unit overnight for close observation.  Osteomyelitis of the right first metatarsal -Continue vancomycin/ceftazidime as dosed by pharmacy. -Dr. Lovell Sheehan, surgery, has been consulted for definitive treatment that will likely require further amputation.  End-stage renal disease on hemodialysis -We'll consult nephrology to continue inpatient dialysis while in the hospital.  Atrial fibrillation -Currently rate controlled. -Continue Coumadin as dosed by pharmacy. -Will await to hear from Dr. Lovell Sheehan if his Coumadin needs to be transiently stopped for surgery in which case he will probably need vitamin K and FFP.  Addendum: Discussed with Dr. Lovell Sheehan. Plan surgery on Friday. Hold  coumadin. Await INR and reverse with vit K and FFP if needed.  History of hypertension -Currently with hypotension likely related to both his sepsis and post dialysis. -As such, his antihypertensive medications will be held at this time.  DVT prophylaxis -Is already fully anticoagulated on Coumadin. -Will order an INR; if low will need heparin drip/Lovenox transiently until therapeutic.  CODE STATUS -Full code   Time Spent on Admission: 80 minutes  [name] [name],[name] [name] [facility] Pager: [phone] 08/05/2013, 5:14 PM

## 2013-08-05 NOTE — ED Notes (Signed)
Patient arrives via EMS from dialysis with c/o post treatment hypotension. Patient received full dialysis treatment today. BP by dialysis staff 90/72. Staff administered 400 ml NS via fistula and vancomycin prior to arrival. Patient alert/oriented x 4. Denies complaints or pain.

## 2013-08-05 NOTE — ED Provider Notes (Signed)
CSN: [id number]     Arrival date & time 08/05/13  1327 History  This chart was scribed for Flint Melter, MD by Ronal Fear, ED Scribe. This patient was seen in room APA06/APA06 and the patient's care was started at 2:12 PM.    Chief Complaint  Patient presents with  . Hypotension   The history is provided by the patient. No language interpreter was used.   HPI Comments: [name] [name] is a 62 y.o. male who presents to the Emergency Department by ambulancecomplaining of post-dialysis hypotension of 90/72. Staff at center administered 400 mL of NS via fistula and Vancomycin prior to EMS arrival. He denies any pain but complains of a fever and vomiting 2-3x days ago.  There are no other known modifying factors. Pt did not eat breakfast or lunch today or yesterday.  Pt has a wound vac to his right foot that was placed by Dr. Lovell Sheehan 6x days ago following a great toe amputation. He was supposed to go see Dr. Lovell Sheehan today at 1:30pm, but he missed the appointment. There is a foul odor from the foot.  Pt has had some cardiac problems but denies a hx of CVA.  PCP: Dr. Sherwood Gambler  Pt is ambulatory and lives with his wife.  Past Medical History  Diagnosis Date  . UGI bleed 02/12/11    on anticoagulation; EGD w/snare polypectomy-multiple polypoid lesions antrum(benign), Chronic active gastritis (NEGATIVE H pylori)  . Anemia, chronic disease   . Sepsis due to enterococcus 02/11/11  . Atrial fibrillation     coumadin  . CAD (coronary artery disease)     s/p CABGx4  . Cardiomyopathy     40-45% EF 6/12  . Diabetes mellitus   . Hypertension   . Hyperlipemia   . Gout   . S/P patent foramen ovale closure   . History of nuclear stress test 11/2011    negative myoview  . End stage kidney disease     T/TH/SAT dialyis, Dr Fausto Skillern  . Gout   . History of nuclear stress test 07/04/2010    dipyridamole; mod fixed inferolateral defect; non-diagnostic for ischemia; low risk scan    Past Surgical History   Procedure Laterality Date  . Cardiac catheterization  12/31/2006    severe diffuse 3 vessel CAD, severe 4+ MR (Dr. Evlyn Courier)  . Mitral valve replacement  01/05/2007    bioprosthetic 31mm Edwards precordial tissue (Dr. Tyrone Sage)  . Coronary artery bypass graft  01/05/2007    x4; LIMA-LAD, SVG-OM, seq. SVG-PDA, PLA-RCA  . Colonoscopy  06/23/2011    Dr. Darrick Penna: multiple sessile and pedunculated polyps, moderate diverticulosis, internal hemorrhoids, +ADENOMATOUS POLYPS, consider genetic testing, surveillance in October 2015   . Esophagogastroduodenoscopy  02/12/2011    CHRONIC ACTIVE GASTRITIS, NO H. pylori  . Transthoracic echocardiogram  11/2011    EF 25% w/ mod dilatation of LV & mod LVH; LA severely dilated; mod-severe dilation of RV with mod-severe decrease in RV function; mild MR & TR  . Cardioversion  04/19/2007    Dr. Jonette Eva  . Transthoracic echocardiogram  02/14/2011    EF 40-45%, mod conc LVH; ventricular septum with motion showing abnormal function, dyssynergy, paradox; mild AS; mild-mod MR stenosis; LA severely dilated; aptrial septum bowed from L to R with increased LA pressure   . Amputation Right 07/14/2013    Procedure: AMPUTATION DIGIT;  Surgeon: Dalia Heading, MD;  Location: [facility];  Service: General;  Laterality: Right;   Family History  Problem  Relation Age of Onset  . Diabetes Mother   . Diabetes Father   . Diabetes Sister   . Colon cancer Neg Hx    History  Substance Use Topics  . Smoking status: Former Smoker -- 1.00 packs/day    Types: Cigarettes    Quit date: 11/27/2006  . Smokeless tobacco: Not on file     Comment: quit about 5 yrs  . Alcohol Use: No     Comment: former user    Review of Systems  Constitutional: Positive for fever. Negative for chills.  Respiratory: Negative for cough and shortness of breath.   Cardiovascular: Positive for leg swelling. Negative for chest pain.  Gastrointestinal: Positive for nausea and vomiting.  All other systems  reviewed and are negative.    Allergies  Bacitracin and Norvasc  Home Medications   No current outpatient prescriptions on file. BP 114/93  Pulse 101  Temp(Src) 100.7 F (38.2 C) (Oral)  Resp 28  Ht 6\' 4"  (1.93 m)  Wt 232 lb (105.235 kg)  BMI 28.25 kg/m2  SpO2 95%  Physical Exam  Nursing note and vitals reviewed. Constitutional: He is oriented to person, place, and time. He appears well-developed and well-nourished.  HENT:  Head: Normocephalic and atraumatic.  Right Ear: External ear normal.  Left Ear: External ear normal.  Eyes: Conjunctivae and EOM are normal. Pupils are equal, round, and reactive to light.  Neck: Normal range of motion and phonation normal. Neck supple.  Cardiovascular: Normal rate, regular rhythm, normal heart sounds and intact distal pulses.   Pulmonary/Chest: Effort normal and breath sounds normal. He exhibits no bony tenderness.  Abdominal: Soft. Normal appearance. There is no tenderness.  Musculoskeletal: Normal range of motion.  1st toe on right foot has been amputated. 2nd toe is necrotic. 3rd toe is pink and has some perfusion. 4th and 5th toe are macerated and appear ischemic. Right lower leg is swollen and red with tenderness to palpation. He can move the foot.   Neurological: He is alert and oriented to person, place, and time. No cranial nerve deficit or sensory deficit. He exhibits normal muscle tone. Coordination normal.  Skin: Skin is warm, dry and intact.  Psychiatric: He has a normal mood and affect. His behavior is normal. Judgment and thought content normal.    ED Course  Procedures (including critical care time)  Medications  insulin glargine (LANTUS) injection 38 Units (38 Units Subcutaneous Given 08/05/13 2121)  multivitamin with minerals tablet 1 tablet (1 tablet Oral Given 08/05/13 1843)  traZODone (DESYREL) tablet 50 mg (50 mg Oral Given 08/05/13 2123)  atorvastatin (LIPITOR) tablet 20 mg (20 mg Oral Given 08/05/13 1844)   amiodarone (PACERONE) tablet 100 mg (100 mg Oral Given 08/05/13 2002)  folic acid (FOLVITE) tablet 1 mg (1 mg Oral Given 08/05/13 1843)  pantoprazole (PROTONIX) EC tablet 40 mg (40 mg Oral Given 08/05/13 2123)  sevelamer carbonate (RENVELA) tablet 800 mg (800 mg Oral Given 08/05/13 1843)  sodium chloride 0.9 % injection 3 mL (3 mLs Intravenous Given 08/05/13 2124)  sodium chloride 0.9 % injection 3 mL (not administered)  0.9 %  sodium chloride infusion (not administered)  acetaminophen (TYLENOL) tablet 650 mg (not administered)    Or  acetaminophen (TYLENOL) suppository 650 mg (not administered)  morphine 2 MG/ML injection 1 mg (not administered)  senna-docusate (Senokot-S) tablet 1 tablet (not administered)  ondansetron (ZOFRAN) tablet 4 mg (not administered)    Or  ondansetron (ZOFRAN) injection 4 mg (not administered)  insulin  aspart (novoLOG) injection 0-9 Units (not administered)  insulin aspart (novoLOG) injection 3 Units (not administered)  vancomycin (VANCOCIN) IVPB 1000 mg/200 mL premix (not administered)  cefTAZidime (FORTAZ) 2 g in dextrose 5 % 50 mL IVPB (not administered)  acetaminophen (TYLENOL) tablet 650 mg (650 mg Oral Given 08/05/13 1557)  cefTAZidime (FORTAZ) 2 g in dextrose 5 % 50 mL IVPB (2 g Intravenous Given 08/05/13 2002)     DIAGNOSTIC STUDIES: Oxygen Saturation is 94% on RA, low by my interpretation.    COORDINATION OF CARE:  2:10 PM-Consult complete with Dr. Lovell Sheehan. Patient case explained and discussed.  He agrees to see patient, and recommends admission for right forefoot amputation; for further evaluation and treatment. Call ended at 1414   2:21 PM- Pt advised of plan for treatment including admitting the patient and bringing in a consult and pt agrees.  4:42 PM-Consult complete with Dr. Michel Santee, Hospitalist. Patient case explained and discussed. She agrees to admit patient for further evaluation and treatment and will evaluate in the ED. Call ended at 4:44  PM.  Patient Vitals for the past 24 hrs:  BP Temp Temp src Pulse Resp SpO2 Height Weight  08/05/13 2100 77/39 mmHg - - 54 29 79 % - -  08/05/13 2000 95/68 mmHg 98.8 F (37.1 C) Oral 100 25 91 % - -  08/05/13 1900 91/37 mmHg - - 98 28 96 % - -  08/05/13 1814 82/43 mmHg 99.3 F (37.4 C) Oral 101 - 98 % 6\' 4"  (1.93 m) 229 lb 15 oz (104.3 kg)  08/05/13 1732 107/63 mmHg - - 100 22 99 % - -  08/05/13 1730 107/63 mmHg - - - 16 - - -  08/05/13 1550 - 100.7 F (38.2 C) Oral - - - - -  08/05/13 1545 - - - 101 28 95 % - -  08/05/13 1400 114/93 mmHg - - 97 28 97 % - -  08/05/13 1331 96/47 mmHg 100.2 F (37.9 C) Oral 106 26 94 % 6\' 4"  (1.93 m) 232 lb (105.235 kg)     Labs Review Labs Reviewed  CBC WITH DIFFERENTIAL - Abnormal; Notable for the following:    WBC 19.4 (*)    RBC 2.66 (*)    Hemoglobin 8.0 (*)    HCT 24.6 (*)    Neutrophils Relative % 89 (*)    Neutro Abs 17.3 (*)    Lymphocytes Relative 3 (*)    Lymphs Abs 0.6 (*)    Monocytes Absolute 1.4 (*)    All other components within normal limits  BASIC METABOLIC PANEL - Abnormal; Notable for the following:    Chloride 94 (*)    Creatinine, Ser 5.45 (*)    GFR calc non Af Amer 10 (*)    GFR calc Af Amer 12 (*)    All other components within normal limits  LACTIC ACID, PLASMA - Abnormal; Notable for the following:    Lactic Acid, Venous 2.8 (*)    All other components within normal limits  PROTIME-INR - Abnormal; Notable for the following:    Prothrombin Time 19.6 (*)    INR 1.71 (*)    All other components within normal limits  CULTURE, BLOOD (ROUTINE X 2)  CULTURE, BLOOD (ROUTINE X 2)  BASIC METABOLIC PANEL  CBC  HEMOGLOBIN A1C   Imaging Review Dg Foot 2 Views Right  08/05/2013   CLINICAL DATA:  History of ulcer involving the 1st digit findings performed evaluate for changes secondary to osteomyelitis  EXAM: RIGHT FOOT - 2 VIEW  COMPARISON:  Great toe evaluation 07/07/2013  FINDINGS: There is subcutaneous emphysema and  ulceration involving the soft tissues overlying the head of the 1st metatarsal. Cortical destruction is identified along the medial and lateral periphery of the head of the 1st metatarsal. A drainage catheter likely representing a wound VAC is appreciated within the ulcer. Atherosclerotic calcification appreciated. There is osteopenia which may represent hyperemia considering the findings. There is no evidence of acute fracture nor dislocation.  IMPRESSION: Findings consistent with osteomyelitis involving the head of the 1st metatarsal until proven otherwise. Dr. Effie Shy of the [facility] emergency department was informed of these findings at the time of initial interpretation via telephone conversation.   Electronically Signed   By: Salome Holmes M.D.   On: 08/05/2013 15:46    EKG Interpretation    Date/Time:  Tuesday August 05 2013 13:27:18 EST Ventricular Rate:  101 PR Interval:    QRS Duration: 108 QT Interval:  414 QTC Calculation: 536 R Axis:   7 Text Interpretation:  Atrial fibrillation with rapid ventricular response Low voltage QRS Septal infarct (cited on or before 25-Dec-2006) Prolonged QT Abnormal ECG When compared with ECG of 07-Jul-2013 20:57, No significant change was found Confirmed by Micaiah Remillard  MD, Ashely Joshua ([id number]) on 08/05/2013 5:14:08 PM            MDM   1. Sepsis   2. Acute osteomyelitis of toe of right foot   3. Atrial fibrillation    Worsening foot infection with evidence for significant ischemia, then will likely require nonoperative management. Right foot does appear infected with possible spreading infection, proximally. He has failed outpatient treatment with parenteral Rocephin. He has chronic kidney disease without significant worsening, and no need for urgent dialysis.  Nursing Notes Reviewed/ Care Coordinated, and agree without changes. Applicable Imaging Reviewed.  Interpretation of Laboratory Data incorporated into ED treatment   Plan: Admit  I  personally performed the services described in this documentation, which was scribed in my presence. The recorded information has been reviewed and is accurate.     Flint Melter, MD 08/05/13 2132

## 2013-08-06 DIAGNOSIS — I70269 Atherosclerosis of native arteries of extremities with gangrene, unspecified extremity: Secondary | ICD-10-CM | POA: Diagnosis present

## 2013-08-06 DIAGNOSIS — E1169 Type 2 diabetes mellitus with other specified complication: Secondary | ICD-10-CM

## 2013-08-06 DIAGNOSIS — I70209 Unspecified atherosclerosis of native arteries of extremities, unspecified extremity: Secondary | ICD-10-CM | POA: Diagnosis present

## 2013-08-06 DIAGNOSIS — E11649 Type 2 diabetes mellitus with hypoglycemia without coma: Secondary | ICD-10-CM | POA: Diagnosis not present

## 2013-08-06 DIAGNOSIS — D638 Anemia in other chronic diseases classified elsewhere: Secondary | ICD-10-CM | POA: Diagnosis present

## 2013-08-06 LAB — BASIC METABOLIC PANEL
BUN: 32 mg/dL — ABNORMAL HIGH (ref 6–23)
CO2: 27 mEq/L (ref 19–32)
Calcium: 10.4 mg/dL (ref 8.4–10.5)
Chloride: 95 mEq/L — ABNORMAL LOW (ref 96–112)
Creatinine, Ser: 7.17 mg/dL — ABNORMAL HIGH (ref 0.50–1.35)
GFR calc Af Amer: 8 mL/min — ABNORMAL LOW (ref >90)
GFR calc non Af Amer: 7 mL/min — ABNORMAL LOW (ref >90)
Glucose, Bld: 47 mg/dL — ABNORMAL LOW (ref 70–99)
Potassium: 3.9 mEq/L (ref 3.5–5.1)
Sodium: 138 mEq/L (ref 135–145)

## 2013-08-06 LAB — CBC
HCT: 24.3 % — ABNORMAL LOW (ref 39.0–52.0)
Hemoglobin: 8 g/dL — ABNORMAL LOW (ref 13.0–17.0)
MCH: 30.5 pg (ref 26.0–34.0)
MCHC: 32.9 g/dL (ref 30.0–36.0)
MCV: 92.7 fL (ref 78.0–100.0)
Platelets: 236 10*3/uL (ref 150–400)
RBC: 2.62 MIL/uL — ABNORMAL LOW (ref 4.22–5.81)
RDW: 15 % (ref 11.5–15.5)
WBC: 17.1 10*3/uL — ABNORMAL HIGH (ref 4.0–10.5)

## 2013-08-06 LAB — HEMOGLOBIN A1C
Hgb A1c MFr Bld: 6.8 % — ABNORMAL HIGH (ref <5.7)
Mean Plasma Glucose: 148 mg/dL — ABNORMAL HIGH (ref <117)

## 2013-08-06 LAB — GLUCOSE, CAPILLARY
Glucose-Capillary: 42 mg/dL — CL (ref 70–99)
Glucose-Capillary: 116 mg/dL — ABNORMAL HIGH (ref 70–99)

## 2013-08-06 LAB — HEPARIN LEVEL (UNFRACTIONATED): Heparin Unfractionated: <0.1 IU/mL — ABNORMAL LOW (ref 0.30–0.70)

## 2013-08-06 MED ORDER — DEXTROSE 50 % IV SOLN
1.0000 | INTRAVENOUS | Status: AC
  Administered 2013-08-06: 50 mL via INTRAVENOUS

## 2013-08-06 MED ORDER — METOPROLOL TARTRATE 25 MG PO TABS
12.5000 mg | ORAL_TABLET | Freq: Three times a day (TID) | ORAL | Status: DC
  Administered 2013-08-06 – 2013-08-10 (×11): 12.5 mg via ORAL
  Filled 2013-08-06 (×11): qty 1

## 2013-08-06 MED ORDER — PENTAFLUOROPROP-TETRAFLUOROETH EX AERO
1.0000 "application " | INHALATION_SPRAY | CUTANEOUS | Status: DC | PRN
  Filled 2013-08-06: qty 103.5

## 2013-08-06 MED ORDER — DEXTROSE-NACL 5-0.45 % IV SOLN
INTRAVENOUS | Status: DC
  Administered 2013-08-06: 08:00:00 via INTRAVENOUS

## 2013-08-06 MED ORDER — SODIUM CHLORIDE 0.9 % IV SOLN: 100.0000 mL | INTRAVENOUS | Status: DC | PRN

## 2013-08-06 MED ORDER — PRO-STAT SUGAR FREE PO LIQD: 30.0000 mL | Freq: Three times a day (TID) | ORAL | Status: DC

## 2013-08-06 MED ORDER — HEPARIN SODIUM (PORCINE) 1000 UNIT/ML DIALYSIS
1000.0000 [IU] | INTRAMUSCULAR | Status: DC | PRN
  Filled 2013-08-06: qty 1

## 2013-08-06 MED ORDER — DEXTROSE 50 % IV SOLN
INTRAVENOUS | Status: AC
  Administered 2013-08-06: 50 mL
  Filled 2013-08-06: qty 50

## 2013-08-06 MED ORDER — NEPRO/CARBSTEADY PO LIQD
237.0000 mL | Freq: Two times a day (BID) | ORAL | Status: DC
  Administered 2013-08-07 – 2013-08-13 (×9): 237 mL via ORAL

## 2013-08-06 MED ORDER — NEPRO/CARBSTEADY PO LIQD: 237.0000 mL | ORAL | Status: DC | PRN

## 2013-08-06 MED ORDER — HEPARIN (PORCINE) IN NACL 100-0.45 UNIT/ML-% IJ SOLN
2000.0000 [IU]/h | INTRAMUSCULAR | Status: DC
  Administered 2013-08-06: 1500 [IU]/h via INTRAVENOUS
  Administered 2013-08-07: 1800 [IU]/h via INTRAVENOUS
  Filled 2013-08-06 (×2): qty 250

## 2013-08-06 MED ORDER — LIDOCAINE HCL (PF) 1 % IJ SOLN: 5.0000 mL | INTRAMUSCULAR | Status: DC | PRN

## 2013-08-06 MED ORDER — HEPARIN BOLUS VIA INFUSION
2000.0000 [IU] | Freq: Once | INTRAVENOUS | Status: AC
  Administered 2013-08-06: 2000 [IU] via INTRAVENOUS
  Filled 2013-08-06: qty 2000

## 2013-08-06 MED ORDER — LIDOCAINE-PRILOCAINE 2.5-2.5 % EX CREA
1.0000 "application " | TOPICAL_CREAM | CUTANEOUS | Status: DC | PRN
  Filled 2013-08-06: qty 5

## 2013-08-06 MED ORDER — EPOETIN ALFA 10000 UNIT/ML IJ SOLN
10000.0000 [IU] | INTRAMUSCULAR | Status: DC
  Administered 2013-08-07: 10000 [IU] via INTRAVENOUS

## 2013-08-06 MED ORDER — PRO-STAT SUGAR FREE PO LIQD
30.0000 mL | Freq: Three times a day (TID) | ORAL | Status: DC
  Administered 2013-08-06 – 2013-08-13 (×16): 30 mL via ORAL
  Filled 2013-08-06 (×15): qty 30

## 2013-08-06 MED ORDER — ALTEPLASE 2 MG IJ SOLR: 2.0000 mg | Freq: Once | INTRAMUSCULAR | Status: AC | PRN

## 2013-08-06 NOTE — Progress Notes (Signed)
Patient states he was drowsy this morning and does not really remember the surgeon coming in to speak to him about surgery and potential for blood transfusion.  I, the RN did not complete the consent for blood transfusion.  MD made aware - states he will speak with patient again in the morning.

## 2013-08-06 NOTE — Progress Notes (Addendum)
ANTICOAGULATION CONSULT NOTE -  Pharmacy Consult for Heparin Indication: atrial fibrillation  Allergies  Allergen Reactions  . Bacitracin Other (See Comments)    unknown  . Norvasc [Amlodipine Besylate] Other (See Comments)    unknown   Patient Measurements: Height: 6\' 4"  (193 cm) Weight: 234 lb 5.6 oz (106.3 kg) IBW/kg (Calculated) : 86.8 Heparin Dosing Weight: 94Kg  Vital Signs: Temp: 98.6 F (37 C) (12/10 1130) Temp src: Oral (12/10 1130) BP: 90/72 mmHg (12/10 1500)  Labs:  Recent Labs  08/05/13 1340 08/05/13 1348 08/06/13 0408 08/06/13 1617  HGB  --  8.0* 8.0*  --   HCT  --  24.6* 24.3*  --   PLT  --  259 236  --   LABPROT 19.6*  --   --   --   INR 1.71*  --   --   --   HEPARINUNFRC  --   --   --  0.55  CREATININE  --  5.45* 7.17*  --     Estimated Creatinine Clearance: 13.9 ml/min (by C-G formula based on Cr of 7.17).  Medical History: Past Medical History  Diagnosis Date  . UGI bleed 02/12/11    on anticoagulation; EGD w/snare polypectomy-multiple polypoid lesions antrum(benign), Chronic active gastritis (NEGATIVE H pylori)  . Anemia, chronic disease   . Sepsis due to enterococcus 02/11/11  . Atrial fibrillation     coumadin  . CAD (coronary artery disease)     s/p CABGx4  . Cardiomyopathy     40-45% EF 6/12  . Diabetes mellitus   . Hypertension   . Hyperlipemia   . Gout   . S/P patent foramen ovale closure   . History of nuclear stress test 11/2011    negative myoview  . End stage kidney disease     T/TH/SAT dialyis, Dr Fausto Skillern  . Gout   . History of nuclear stress test 07/04/2010    dipyridamole; mod fixed inferolateral defect; non-diagnostic for ischemia; low risk scan     Medications:  Prescriptions prior to admission  Medication Sig Dispense Refill  . allopurinol (ZYLOPRIM) 100 MG tablet Take 100 mg by mouth daily.       Marland Kitchen amiodarone (PACERONE) 200 MG tablet Take 100 mg by mouth daily.       Marland Kitchen aspirin 81 MG tablet Take 81 mg by mouth  daily.        Marland Kitchen atorvastatin (LIPITOR) 20 MG tablet Take 1 tablet (20 mg total) by mouth daily.  30 tablet  11  . B Complex-C-Zn-Folic Acid (DIALYVITE/ZINC PO) Take by mouth 3 (three) times a week. Patient has dialysis on Tuesdays, Thursdays, and Saturdays      . diltiazem (CARDIZEM CD) 180 MG 24 hr capsule Take 1 capsule (180 mg total) by mouth daily.  30 capsule  1  . folic acid (FOLVITE) 1 MG tablet Take 1 mg by mouth daily.        Marland Kitchen glimepiride (AMARYL) 4 MG tablet Take 4 mg by mouth 2 (two) times daily.        . Insulin Glargine (LANTUS SOLOSTAR) 100 UNIT/ML SOPN Inject 38 Units into the skin at bedtime.      . metoprolol (LOPRESSOR) 100 MG tablet Take 1 tablet (100 mg total) by mouth 2 (two) times daily.  60 tablet  1  . Multiple Vitamin (MULTIVITAMIN) capsule Take 1 capsule by mouth daily.      . pantoprazole (PROTONIX) 40 MG tablet Take 40 mg by mouth 2 (  two) times daily.       Marland Kitchen RENVELA 800 MG tablet Take 800 mg by mouth 3 (three) times daily with meals.       . torsemide (DEMADEX) 20 MG tablet Take 20 mg by mouth 2 (two) times daily.      . traZODone (DESYREL) 50 MG tablet Take 50 mg by mouth at bedtime.      Marland Kitchen warfarin (COUMADIN) 2.5 MG tablet Take 1.25 mg by mouth daily.        Assessment: 62yo male admitted for osteomyelitis and suspected sepsis.  Pt is on Coumadin PTA for h/o afib.  Plan is for pt to have BKA tomorrow and asked to bridge anticoagulation with IV Heparin prior to surgery.  INR yesterday was 1.71.   Heparin level below goal, less than 0.10.  Initially lab reported as 0.55, RN paged with updated/corrected heparin level.  Goal of Therapy:  Heparin level 0.3-0.7 units/ml Monitor platelets by anticoagulation protocol: Yes   Plan:  Heparin 2000 Unit IV bolus, increase heparin to 1800 units/hour Heparin level in 6 hours and daily Plan to stop heparin tomorrow am in anticipation of surgery Monitor CBC  Raquel Bronte, Brodey Bonn Bennett 08/06/2013,5:29 PM

## 2013-08-06 NOTE — Progress Notes (Signed)
Inpatient Diabetes Program Recommendations  AACE/ADA: New Consensus Statement on Inpatient Glycemic Control (2013)  Target Ranges:  Prepandial:   less than 140 mg/dL      Peak postprandial:   less than 180 mg/dL (1-2 hours)      Critically ill patients:  140 - 180 mg/dL   Results for [name] [name], [name] [name] (MRN [id number]) as of 08/06/2013 08:58  Ref. Range 08/05/2013 21:41 08/06/2013 07:46 08/06/2013 08:30  Glucose-Capillary Latest Range: 70-99 mg/dL 784 (H) 42 (LL) 696 (H)    Inpatient Diabetes Program Recommendations Insulin - Basal: Please decrease Lantus to 24 units QHS.  Note: Patient has a history of diabetes and according to the home medication list, patient takes Amaryl 4 mg BID and Lantus 38 units QHS as an outpatient for diabetes management.  Currently, patient is ordered to receive Lantus 38 units QHS and Novolog 0-9 units AC for inpatient glycemic control.  Patient was hospitalized from 07/07/13 to 07/18/13 and during that time blood glucose was well controlled on Lantus 24 units QHS and Novolog sensitive correction ACHS.  According to the discharge summary on 07/18/13, patient was discharged on Lantus 24 units QHS and Amaryl 4 mg BID for diabetes management.  Noted hypoglycemia this morning with blood glucose of 42 mg/dl at 2:95 am.  Please consider decreasing Lantus to 24 units QHS.  Will continue to follow.  Thanks, Orlando Penner, RN, MSN, CCRN Diabetes Coordinator Inpatient Diabetes Program [phone] (Team Pager) [phone] ([facility] office) [phone] [facility] office)

## 2013-08-06 NOTE — Progress Notes (Addendum)
[facility] PROGRESS NOTE  [name] [name] RUE:[id number] DOB: Jan 25, 1951 DOA: 08/05/2013 PCP: [name] [name]., MD    Code Status: Full code Family Communication: Family not available currently Disposition Plan: To be determined, the patient may require short-term skilled nursing facility placement following surgery.   Consultants:  General surgeon, Dr. Lovell Sheehan  Nephrology pending  Procedures:  Hemodialysis pending  Antibiotics:  IV vancomycin 08/05/2013>>  IV ceftaz 08/05/2013>>   HPI/Subjective: The patient is noted to have a blood glucose of 47. He denies any symptomatology. He denies chest pain or shortness of breath. He denies right foot pain.  Objective: Filed Vitals:   08/06/13 0900  BP: 103/61  Pulse:   Temp:   Resp: 29   MAXIMUM TEMPERATURE 100.7. T. current 98.3. Pulse 105-120. Oxygen saturation 100%.    Intake/Output Summary (Last 24 hours) at 08/06/13 0905 Last data filed at 08/05/13 2002  Gross per 24 hour  Intake    110 ml  Output      0 ml  Net    110 ml   Filed Weights   08/05/13 1331 08/05/13 1814 08/06/13 0500  Weight: 105.235 kg (232 lb) 104.3 kg (229 lb 15 oz) 106.3 kg (234 lb 5.6 oz)    Exam:   General:  pleasant alert 62 year old [name] laying in bed, in no acute distress. No evidence of diaphoresis or tremor.  Cardiovascular: irregular, irregular, with mild tachycardia.  Respiratory: occasional crackles auscultated in the bases. Breathing is nonlabored.  Abdomen: positive bowel sounds, soft, nontender, nondistended.  Musculoskeletal/extremities:  Right foot with wound VAC in place. Globally, the foot is boggy, edematous, mildly erythematous, and cyanotic-appearing over the previous operative site about first metatarsal region and the second and third digits. Globally nontender of the right foot. Malodorous seropurulent and serosanguineous drainage noted. 2+ right lower extremity edema and no edema on the  left.  Neurologic: He is alert and oriented x2. Cranial nerves II through XII are grossly intact.  Data Reviewed: Basic Metabolic Panel:  Recent Labs Lab 08/05/13 1348 08/06/13 0408  NA 137 138  K 3.6 3.9  CL 94* 95*  CO2 24 27  GLUCOSE 98 47*  BUN 22 32*  CREATININE 5.45* 7.17*  CALCIUM 9.4 10.4   Liver Function Tests: No results found for this basename: AST, ALT, ALKPHOS, BILITOT, PROT, ALBUMIN,  in the last 168 hours No results found for this basename: LIPASE, AMYLASE,  in the last 168 hours No results found for this basename: AMMONIA,  in the last 168 hours CBC:  Recent Labs Lab 08/05/13 1348 08/06/13 0408  WBC 19.4* 17.1*  NEUTROABS 17.3*  --   HGB 8.0* 8.0*  HCT 24.6* 24.3*  MCV 92.5 92.7  PLT 259 236   Cardiac Enzymes: No results found for this basename: CKTOTAL, CKMB, CKMBINDEX, TROPONINI,  in the last 168 hours BNP (last 3 results) No results found for this basename: PROBNP,  in the last 8760 hours CBG:  Recent Labs Lab 08/05/13 2141 08/06/13 0746 08/06/13 0830  GLUCAP 109* 42* 124*    No results found for this or any previous visit (from the past 240 hour(s)).   Studies: Dg Foot 2 Views Right  08/05/2013   CLINICAL DATA:  History of ulcer involving the 1st digit findings performed evaluate for changes secondary to osteomyelitis  EXAM: RIGHT FOOT - 2 VIEW  COMPARISON:  Great toe evaluation 07/07/2013  FINDINGS: There is subcutaneous emphysema and ulceration involving the soft tissues overlying the head of the  1st metatarsal. Cortical destruction is identified along the medial and lateral periphery of the head of the 1st metatarsal. A drainage catheter likely representing a wound VAC is appreciated within the ulcer. Atherosclerotic calcification appreciated. There is osteopenia which may represent hyperemia considering the findings. There is no evidence of acute fracture nor dislocation.  IMPRESSION: Findings consistent with osteomyelitis involving the  head of the 1st metatarsal until proven otherwise. Dr. Effie Shy of the [facility] emergency department was informed of these findings at the time of initial interpretation via telephone conversation.   Electronically Signed   By: Salome Holmes M.D.   On: 08/05/2013 15:46    Scheduled Meds: . amiodarone  100 mg Oral Daily  . atorvastatin  20 mg Oral q1800  . [START ON 08/07/2013] cefTAZidime (FORTAZ)  IV  2 g Intravenous Q T,Th,Sa-HD  . folic acid  1 mg Oral Daily  . insulin aspart  0-9 Units Subcutaneous TID WC  . insulin glargine  38 Units Subcutaneous QHS  . multivitamin with minerals  1 tablet Oral Daily  . pantoprazole  40 mg Oral BID  . sevelamer carbonate  800 mg Oral TID WC  . sodium chloride  3 mL Intravenous Q12H  . traZODone  50 mg Oral QHS  . [START ON 08/07/2013] vancomycin  1,000 mg Intravenous Q T,Th,Sa-HD   Continuous Infusions: . dextrose 5 % and 0.45% NaCl 50 mL/hr at 08/06/13 0818    Assessment:  Principal Problem:   Sepsis Active Problems:   Osteomyelitis of right foot   Atherosclerosis of native arteries of the extremities with gangrene   Atrial fibrillation   Mino term (current) use of anticoagulants   CAD (coronary artery disease)   ESRD (end stage renal disease) on dialysis   DM type 2 causing ESRD   Hypotension   Hypoglycemia associated with diabetes     1.Osteomyelitis of the first metatarsal with associated cellulitis and gangrene of the right foot secondary to peripheral vascular disease. 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 Previously and currently being treated with a wound VAC on the right foot per Dr. Lovell Sheehan. However, the infection has progressed and Dr. Lovell Sheehan recommends a below-the-knee amputation. The patient's fever curve is decreasing. His white blood cell count has improved a little. We'll continue IV antibiotics with vancomycin and ceftaz. Wound VAC management per recommendations by Dr. Lovell Sheehan.  Sepsis secondary  to #1. Noted lactic acidosis on admission. Sepsis appears to be resolving. Continue antibiotics and supportive treatment as above.  Chronic atrial fibrillation with rapid ventricular response. There may be some rebound tachycardia. We'll continue amiodarone. Diltiazem and metoprolol are on hold because of low-normal blood pressures. Will restart metoprolol at a lower dose with parameters. We'll continue to monitor.  Chronic anticoagulation with warfarin secondary to age or fibrillation. Coumadin is on hold because of the upcoming operation. We'll start IV heparin per pharmacy.  Coronary artery disease/ischemic cardiomyopathy. Aspirin and metoprolol are on hold because of upcoming operation and low-normal blood pressures. Torsemide is on hold, but he is scheduled for hemodialysis. Would favor restarting small dosing of metoprolol.  Diabetes mellitus with hypoglycemia. This was treated with an amp of D50. We'll add gentle dextrose infusion. We'll discontinue NovoLog with meals and discontinue Lantus. Continue sliding scale NovoLog.  End-stage renal disease. Nephrology consult pending. Hemodialysis per nephrology.  Anemia of critical illness and chronic disease. Discussed with Dr. Lovell Sheehan who recommends transfusion with anticipated blood loss during the operation.   1. Mealtime NovoLog  was discontinued. We'll discontinue Lantus. Status post amp of D50 and gentle dextrose infusion. 2. Add small dosing of metoprolol with holding parameters. 3. Wound VAC management per Dr. Lovell Sheehan. Tentative plan BKA tomorrow. 4. Start IV heparin per pharmacy. Hold prior to scheduled surgery. 5. Transfuse 2 units of packed red blood cells tomorrow during dialysis.   Time spent: 40 minutes.    [facility]  [facility] Pager [phone]. If 7PM-7AM, please contact night-coverage at [email or web address], password [facility] 08/06/2013, 9:05 AM  LOS: 1 day

## 2013-08-06 NOTE — Progress Notes (Addendum)
INITIAL NUTRITION ASSESSMENT  DOCUMENTATION CODES Per approved criteria  -Not Applicable   INTERVENTION: Nepro Shake po BID, each supplement provides 425 kcal and 19 grams protein. ProStat 30 ml TID (each 30 ml provides 100 kcal, 15 gr protein) RD will follow for nutrition care  NUTRITION DIAGNOSIS: Increased protein-energy needs related to ESRD with HD and gangrene in right foot as evidenced by pt medical problems and current guidelines for nutrition requirements.   Goal: Pt to meet >/= 90% of their estimated nutrition needs   Monitor:  Nutritional adequacy, changes in status, labs  Reason for Assessment: Malnutrition Screen Score = 3  62 y.o. male  Admitting Dx: Sepsis  ASSESSMENT: Pt on dialysis for past 6 years. His appetite is good and wt is stable. Followed by surgery for gangrene to right foot. Scheduled for right BKA on 08/08/13. He like Nepro and we discussed the importance of adequate protein-energy intake given his increased nutrition needs and pending surgery.  Height: Ht Readings from Last 1 Encounters:  08/05/13 6\' 4"  (1.93 m)    Weight: Wt Readings from Last 1 Encounters:  08/06/13 234 lb 5.6 oz (106.3 kg)    Ideal Body Weight: 202# (91.8 kg)  % Ideal Body Weight: 116%  Wt Readings from Last 10 Encounters:  08/06/13 234 lb 5.6 oz (106.3 kg)  07/16/13 237 lb 10.5 oz (107.8 kg)  07/16/13 237 lb 10.5 oz (107.8 kg)  06/02/13 236 lb 6.4 oz (107.23 kg)  09/23/12 235 lb 6.4 oz (106.777 kg)  04/17/12 234 lb (106.142 kg)  12/27/11 240 lb (108.863 kg)  06/23/11 233 lb (105.688 kg)  06/23/11 233 lb (105.688 kg)  05/26/11 235 lb (106.595 kg)    Usual Body Weight: 233-237#  % Usual Body Weight: 100%  BMI:  Body mass index is 28.54 kg/(m^2).overweight  Estimated Nutritional Needs: Kcal: 2500-2800  Protein: 110-129 gr Fluid: 1000 ml plus output   Skin: dialysis fistula right arm  Diet Order: Renal 80/90-09-29-1198 ml daily  EDUCATION  NEEDS: -Education needs addressed   Intake/Output Summary (Last 24 hours) at 08/06/13 1457 Last data filed at 08/05/13 2002  Gross per 24 hour  Intake    110 ml  Output      0 ml  Net    110 ml    Last BM: 08/04/13   Labs:   Recent Labs Lab 08/05/13 1348 08/06/13 0408  NA 137 138  K 3.6 3.9  CL 94* 95*  CO2 24 27  BUN 22 32*  CREATININE 5.45* 7.17*  CALCIUM 9.4 10.4  GLUCOSE 98 47*    CBG (last 3)   Recent Labs  08/06/13 0746 08/06/13 0830 08/06/13 1142  GLUCAP 42* 124* 116*    Scheduled Meds: . amiodarone  100 mg Oral Daily  . atorvastatin  20 mg Oral q1800  . [START ON 08/07/2013] cefTAZidime (FORTAZ)  IV  2 g Intravenous Q T,Th,Sa-HD  . feeding supplement (PRO-STAT SUGAR FREE 64)  30 mL Oral TID WC  . folic acid  1 mg Oral Daily  . insulin aspart  0-9 Units Subcutaneous TID WC  . metoprolol tartrate  12.5 mg Oral TID  . multivitamin with minerals  1 tablet Oral Daily  . pantoprazole  40 mg Oral BID  . sevelamer carbonate  800 mg Oral TID WC  . sodium chloride  3 mL Intravenous Q12H  . traZODone  50 mg Oral QHS  . [START ON 08/07/2013] vancomycin  1,000 mg Intravenous Q T,Th,Sa-HD  Continuous Infusions: . dextrose 5 % and 0.45% NaCl 40 mL/hr at 08/06/13 0921  . heparin 1,500 Units/hr (08/06/13 1110)    Past Medical History  Diagnosis Date  . UGI bleed 02/12/11    on anticoagulation; EGD w/snare polypectomy-multiple polypoid lesions antrum(benign), Chronic active gastritis (NEGATIVE H pylori)  . Anemia, chronic disease   . Sepsis due to enterococcus 02/11/11  . Atrial fibrillation     coumadin  . CAD (coronary artery disease)     s/p CABGx4  . Cardiomyopathy     40-45% EF 6/12  . Diabetes mellitus   . Hypertension   . Hyperlipemia   . Gout   . S/P patent foramen ovale closure   . History of nuclear stress test 11/2011    negative myoview  . End stage kidney disease     T/TH/SAT dialyis, Dr Fausto Skillern  . Gout   . History of nuclear  stress test 07/04/2010    dipyridamole; mod fixed inferolateral defect; non-diagnostic for ischemia; low risk scan     Past Surgical History  Procedure Laterality Date  . Cardiac catheterization  12/31/2006    severe diffuse 3 vessel CAD, severe 4+ MR (Dr. Evlyn Courier)  . Mitral valve replacement  01/05/2007    bioprosthetic 31mm Edwards precordial tissue (Dr. Tyrone Sage)  . Coronary artery bypass graft  01/05/2007    x4; LIMA-LAD, SVG-OM, seq. SVG-PDA, PLA-RCA  . Colonoscopy  06/23/2011    Dr. Darrick Penna: multiple sessile and pedunculated polyps, moderate diverticulosis, internal hemorrhoids, +ADENOMATOUS POLYPS, consider genetic testing, surveillance in October 2015   . Esophagogastroduodenoscopy  02/12/2011    CHRONIC ACTIVE GASTRITIS, NO H. pylori  . Transthoracic echocardiogram  11/2011    EF 25% w/ mod dilatation of LV & mod LVH; LA severely dilated; mod-severe dilation of RV with mod-severe decrease in RV function; mild MR & TR  . Cardioversion  04/19/2007    Dr. Jonette Eva  . Transthoracic echocardiogram  02/14/2011    EF 40-45%, mod conc LVH; ventricular septum with motion showing abnormal function, dyssynergy, paradox; mild AS; mild-mod MR stenosis; LA severely dilated; aptrial septum bowed from L to R with increased LA pressure   . Amputation Right 07/14/2013    Procedure: AMPUTATION DIGIT;  Surgeon: Dalia Heading, MD;  Location: [facility];  Service: General;  Laterality: Right;    Royann Shivers MS,RD,CSG,LDN Office: [phone] Pager: [phone]

## 2013-08-06 NOTE — Care Management Note (Addendum)
    Page 1 of 1   08/12/2013     12:49:02 PM   CARE MANAGEMENT NOTE 08/12/2013  Patient:  [name] [name], [name] [name]   Account Number:  [id number]  Date Initiated:  08/06/2013  Documentation initiated by:  Sharrie Rothman  Subjective/Objective Assessment:   Pt admitted from home with osteomylitis. Pt lives with his wife and will return home at discharge. Pt is fairly independent with ADL's. Pt has a walker and would vac from [facility]. Pt is also active with [facility] RN. Receives dialysis from [place] in     Action/Plan:   [place]. Will continue to follow for discharge planning needs.   Anticipated DC Date:  08/11/2013   Anticipated DC Plan:  HOME W HOME HEALTH SERVICES      DC Planning Services  CM consult      Choice offered to / List presented to:             Status of service:  Completed, signed off Medicare Important Message given?   (If response is "NO", the following Medicare IM given date fields will be blank) Date Medicare IM given:   Date Additional Medicare IM given:    Discharge Disposition:    Per UR Regulation:    If discussed at [place] Length of Stay Meetings, dates discussed:   08/12/2013    Comments:  08/12/13 Rosemary Holms RN BNS CM Pt has several options for DC disposition. CIR will most likely take him when medically stable. CSW is working on SNF placement and LTAC ([facility]) currently does not have a dialysis bed.  08/06/13 1515 Arlyss Queen, RN BSN CM

## 2013-08-06 NOTE — Progress Notes (Signed)
Right foot has not worsened. No need for acute surgical intervention at this time. He is still scheduled to undergo right below-the-knee amputation on 08/08/2013. He is to undergo dialysis tomorrow. We will need to get his hematocrit up to 30 as the surgery does have blood loss.

## 2013-08-06 NOTE — Progress Notes (Signed)
ANTICOAGULATION CONSULT NOTE - Initial Consult  Pharmacy Consult for Heparin Indication: atrial fibrillation  Allergies  Allergen Reactions  . Bacitracin Other (See Comments)    unknown  . Norvasc [Amlodipine Besylate] Other (See Comments)    unknown   Patient Measurements: Height: 6\' 4"  (193 cm) Weight: 234 lb 5.6 oz (106.3 kg) IBW/kg (Calculated) : 86.8 Heparin Dosing Weight: 94Kg  Vital Signs: Temp: 98.3 F (36.8 C) (12/10 0400) Temp src: Axillary (12/10 0400) BP: 103/61 mmHg (12/10 0900) Pulse Rate: 102 (12/09 2200)  Labs:  Recent Labs  08/05/13 1340 08/05/13 1348 08/06/13 0408  HGB  --  8.0* 8.0*  HCT  --  24.6* 24.3*  PLT  --  259 236  LABPROT 19.6*  --   --   INR 1.71*  --   --   CREATININE  --  5.45* 7.17*    Estimated Creatinine Clearance: 13.9 ml/min (by C-G formula based on Cr of 7.17).  Medical History: Past Medical History  Diagnosis Date  . UGI bleed 02/12/11    on anticoagulation; EGD w/snare polypectomy-multiple polypoid lesions antrum(benign), Chronic active gastritis (NEGATIVE H pylori)  . Anemia, chronic disease   . Sepsis due to enterococcus 02/11/11  . Atrial fibrillation     coumadin  . CAD (coronary artery disease)     s/p CABGx4  . Cardiomyopathy     40-45% EF 6/12  . Diabetes mellitus   . Hypertension   . Hyperlipemia   . Gout   . S/P patent foramen ovale closure   . History of nuclear stress test 11/2011    negative myoview  . End stage kidney disease     T/TH/SAT dialyis, Dr Fausto Skillern  . Gout   . History of nuclear stress test 07/04/2010    dipyridamole; mod fixed inferolateral defect; non-diagnostic for ischemia; low risk scan     Medications:  Prescriptions prior to admission  Medication Sig Dispense Refill  . allopurinol (ZYLOPRIM) 100 MG tablet Take 100 mg by mouth daily.       Marland Kitchen amiodarone (PACERONE) 200 MG tablet Take 100 mg by mouth daily.       Marland Kitchen aspirin 81 MG tablet Take 81 mg by mouth daily.        Marland Kitchen  atorvastatin (LIPITOR) 20 MG tablet Take 1 tablet (20 mg total) by mouth daily.  30 tablet  11  . B Complex-C-Zn-Folic Acid (DIALYVITE/ZINC PO) Take by mouth 3 (three) times a week. Patient has dialysis on Tuesdays, Thursdays, and Saturdays      . diltiazem (CARDIZEM CD) 180 MG 24 hr capsule Take 1 capsule (180 mg total) by mouth daily.  30 capsule  1  . folic acid (FOLVITE) 1 MG tablet Take 1 mg by mouth daily.        Marland Kitchen glimepiride (AMARYL) 4 MG tablet Take 4 mg by mouth 2 (two) times daily.        . Insulin Glargine (LANTUS SOLOSTAR) 100 UNIT/ML SOPN Inject 38 Units into the skin at bedtime.      . metoprolol (LOPRESSOR) 100 MG tablet Take 1 tablet (100 mg total) by mouth 2 (two) times daily.  60 tablet  1  . Multiple Vitamin (MULTIVITAMIN) capsule Take 1 capsule by mouth daily.      . pantoprazole (PROTONIX) 40 MG tablet Take 40 mg by mouth 2 (two) times daily.       Marland Kitchen RENVELA 800 MG tablet Take 800 mg by mouth 3 (three) times daily with meals.       Marland Kitchen  torsemide (DEMADEX) 20 MG tablet Take 20 mg by mouth 2 (two) times daily.      . traZODone (DESYREL) 50 MG tablet Take 50 mg by mouth at bedtime.      Marland Kitchen warfarin (COUMADIN) 2.5 MG tablet Take 1.25 mg by mouth daily.        Assessment: 62yo male admitted for osteomyelitis and suspected sepsis.  Pt is on Coumadin PTA for h/o afib.  Plan is for pt to have BKA tomorrow and asked to bridge anticoagulation with IV Heparin prior to surgery.  INR yesterday was 1.71.    Goal of Therapy:  Heparin level 0.3-0.7 units/ml Monitor platelets by anticoagulation protocol: Yes   Plan:  Heparin infusion at 16 units/Kg/Hr per adjusted BW Check heparin level in 6-8 hrs Plan to stop heparin tomorrow am in anticipation of surgery Monitor CBC  Margo Aye, Hermila Millis A 08/06/2013,9:27 AM

## 2013-08-06 NOTE — Progress Notes (Signed)
CRITICAL VALUE ALERT  Critical value received:  Heparin level < 0.10  Date of notification:  08/06/13  Time of notification:  1845  Critical value read back:yes  Nurse who received alert:  Sherrye Payor RN   MD notified (1st page):  On call pharmacist called  Time of first page:  1855  MD notified (2nd page):  Time of second page:  Responding MD:  Audelia Acton - on call pharmacist  Time MD responded:  [phone]

## 2013-08-06 NOTE — Progress Notes (Signed)
UR chart review completed.  

## 2013-08-06 NOTE — Consult Note (Signed)
[name] [name] MRN: [id number] DOB/AGE: Mar 23, 1951 62 y.o. Primary Care Physician:[name] J., MD Admit date: 08/05/2013 Chief Complaint:  Chief Complaint  Patient presents with  . Hypotension   HPI: Pt is 62 year old male with past medical hx of ESRD who was sent to ER with c/o Hypotension.  HPI dates back to yesterday when pt after completing hi Dialysis tx was noted to be hypotensive and was sent to ER. On evaluation in ER pt was found to be febrile and hypotensive Pt has Xray done which showed continued osteo and pt was admitted for further care,. Pt offers no c.o chest pain no c/o cough No c/o nausea No c/o abdominal pain NO c/o change in speech/ vision. Pt did complete his HD tx yesterday    Past Medical History  Diagnosis Date  . UGI bleed 02/12/11    on anticoagulation; EGD w/snare polypectomy-multiple polypoid lesions antrum(benign), Chronic active gastritis (NEGATIVE H pylori)  . Anemia, chronic disease   . Sepsis due to enterococcus 02/11/11  . Atrial fibrillation     coumadin  . CAD (coronary artery disease)     [name] [name]  . Cardiomyopathy     40-45% EF 6/12  . Diabetes mellitus   . Hypertension   . Hyperlipemia   . Gout   . [name] [name]   . History of nuclear stress test 11/2011    negative myoview  . End stage kidney disease     T/TH/SAT dialyis, [name] [name]  . Gout   . History of nuclear stress test 07/04/2010    dipyridamole; mod fixed inferolateral defect; non-diagnostic for ischemia; low risk scan       Family History  Problem Relation Age of Onset  . Diabetes Mother   . Diabetes Father   . Diabetes Sister   . Colon cancer Neg Hx     Social History:  reports that he quit smoking about 6 years ago. His smoking use included Cigarettes. He smoked 1.00 pack per day. He does not have any smokeless tobacco history on file. He reports that he does not drink alcohol or use illicit drugs.   Allergies:  Allergies  Allergen  Reactions  . Bacitracin Other (See Comments)    unknown  . Norvasc [Amlodipine Besylate] Other (See Comments)    unknown    Medications Prior to Admission  Medication Sig Dispense Refill  . allopurinol (ZYLOPRIM) 100 MG tablet Take 100 mg by mouth daily.       Marland Kitchen amiodarone (PACERONE) 200 MG tablet Take 100 mg by mouth daily.       Marland Kitchen aspirin 81 MG tablet Take 81 mg by mouth daily.        Marland Kitchen atorvastatin (LIPITOR) 20 MG tablet Take 1 tablet (20 mg total) by mouth daily.  30 tablet  11  . B Complex-C-Zn-Folic Acid (DIALYVITE/ZINC PO) Take by mouth 3 (three) times a week. Patient has dialysis on Tuesdays, Thursdays, and Saturdays      . diltiazem (CARDIZEM CD) 180 MG 24 hr capsule Take 1 capsule (180 mg total) by mouth daily.  30 capsule  1  . folic acid (FOLVITE) 1 MG tablet Take 1 mg by mouth daily.        Marland Kitchen glimepiride (AMARYL) 4 MG tablet Take 4 mg by mouth 2 (two) times daily.        . Insulin Glargine (LANTUS SOLOSTAR) 100 UNIT/ML SOPN Inject 38 Units into the skin at bedtime.      Marland Kitchen  metoprolol (LOPRESSOR) 100 MG tablet Take 1 tablet (100 mg total) by mouth 2 (two) times daily.  60 tablet  1  . Multiple Vitamin (MULTIVITAMIN) capsule Take 1 capsule by mouth daily.      . pantoprazole (PROTONIX) 40 MG tablet Take 40 mg by mouth 2 (two) times daily.       Marland Kitchen RENVELA 800 MG tablet Take 800 mg by mouth 3 (three) times daily with meals.       . torsemide (DEMADEX) 20 MG tablet Take 20 mg by mouth 2 (two) times daily.      . traZODone (DESYREL) 50 MG tablet Take 50 mg by mouth at bedtime.      Marland Kitchen warfarin (COUMADIN) 2.5 MG tablet Take 1.25 mg by mouth daily.           JXB:JYNWG from the symptoms mentioned above,there are no other symptoms referable to all systems reviewed.  Meds  . amiodarone  100 mg Oral Daily  . atorvastatin  20 mg Oral q1800  . [START ON 08/07/2013] cefTAZidime (FORTAZ)  IV  2 g Intravenous Q T,Th,Sa-HD  . folic acid  1 mg Oral Daily  . insulin aspart  0-9 Units  Subcutaneous TID WC  . metoprolol tartrate  12.5 mg Oral TID  . multivitamin with minerals  1 tablet Oral Daily  . pantoprazole  40 mg Oral BID  . sevelamer carbonate  800 mg Oral TID WC  . sodium chloride  3 mL Intravenous Q12H  . traZODone  50 mg Oral QHS  . [START ON 08/07/2013] vancomycin  1,000 mg Intravenous Q T,Th,Sa-HD     Physical Exam: Vital signs in last 24 hours: Temp:  [97.8 F (36.6 C)-100.7 F (38.2 C)] 98.3 F (36.8 C) (12/10 0730) Pulse Rate:  [54-106] 102 (12/09 2200) Resp:  [16-29] 29 (12/10 0900) BP: (77-114)/(30-93) 101/67 mmHg (12/10 1116) SpO2:  [79 %-100 %] 100 % (12/10 0900) Weight:  [229 lb 15 oz (104.3 kg)-234 lb 5.6 oz (106.3 kg)] 234 lb 5.6 oz (106.3 kg) (12/10 0500) Weight change:  Last BM Date: 08/04/13  Intake/Output from previous day: 12/09 0701 - 12/10 0700 In: 110 [P.O.:60; IV Piggyback:50] Out: -      Physical Exam: General- pt is awake,alert, oriented to time place and person Resp- No acute REsp distress, CTA B/L NO Rhonchi CVS- S1S2 Irregular in rate and rhythm GIT- BS+, soft, NT, ND EXT- Right LE Edema,Right foot in bandage CNS- CN 2-12 grossly intact. Moving all 4 extremities Psych- normal mod and affect Access-  AVF + thrill    Lab Results: CBC  Recent Labs  08/05/13 1348 08/06/13 0408  WBC 19.4* 17.1*  HGB 8.0* 8.0*  HCT 24.6* 24.3*  PLT 259 236    BMET  Recent Labs  08/05/13 1348 08/06/13 0408  NA 137 138  K 3.6 3.9  CL 94* 95*  CO2 24 27  GLUCOSE 98 47*  BUN 22 32*  CREATININE 5.45* 7.17*  CALCIUM 9.4 10.4    MICRO Recent Results (from the past 240 hour([name]))  CULTURE, BLOOD (ROUTINE X 2)     Status: None   Collection Time    08/05/13  3:04 PM      Result Value Range Status   Specimen Description BLOOD LEFT HAND   Final   Special Requests BOTTLES DRAWN AEROBIC AND ANAEROBIC 6CC   Final   Culture NO GROWTH 1 DAY   Final   Report Status PENDING   Incomplete  CULTURE,  BLOOD (ROUTINE X 2)      Status: None   Collection Time    08/05/13  3:13 PM      Result Value Range Status   Specimen Description BLOOD LEFT ARM   Final   Special Requests BOTTLES DRAWN AEROBIC ONLY 4CC   Final   Culture NO GROWTH 1 DAY   Final   Report Status PENDING   Incomplete      Lab Results  Component Value Date   CALCIUM 10.4 08/06/2013   PHOS 5.7* 07/15/2013      Impression: 1)Renal  ESRD on HD  On TTS schedule NO need of HD today Will dialyze in am   2)HTN BP stable Medication- On Beta blockers  3)Anemia HGb NOT at goal (9--11) Will transfuse during Hd   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

## 2013-08-06 NOTE — Progress Notes (Signed)
Hypoglycemic Event  CBG: 42  Treatment: 15 GM carbohydrate snack and D50 IV 25 mL  Symptoms: None  Follow-up CBG: [id number] CBG Result:124  Possible Reasons for Event: Inadequate meal intake  Comments/MD notified:Dr. Fisher aware - orders placed, D50 amp given, fluids changed.  Patient eating breakfast.  States he has no S/S     [name] [name], Jeanella Anton  Remember to initiate Hypoglycemia Order Set & complete

## 2013-08-07 ENCOUNTER — Encounter (HOSPITAL_COMMUNITY): Payer: Medicare Other

## 2013-08-07 DIAGNOSIS — D638 Anemia in other chronic diseases classified elsewhere: Secondary | ICD-10-CM

## 2013-08-07 LAB — CBC
HCT: 24.6 % — ABNORMAL LOW (ref 39.0–52.0)
Hemoglobin: 8 g/dL — ABNORMAL LOW (ref 13.0–17.0)
MCH: 30.1 pg (ref 26.0–34.0)
MCV: 92.5 fL (ref 78.0–100.0)
RBC: 2.66 MIL/uL — ABNORMAL LOW (ref 4.22–5.81)
WBC: 20.4 10*3/uL — ABNORMAL HIGH (ref 4.0–10.5)

## 2013-08-07 LAB — GLUCOSE, CAPILLARY
Glucose-Capillary: 173 mg/dL — ABNORMAL HIGH (ref 70–99)
Glucose-Capillary: 142 mg/dL — ABNORMAL HIGH (ref 70–99)
Glucose-Capillary: 131 mg/dL — ABNORMAL HIGH (ref 70–99)
Glucose-Capillary: 157 mg/dL — ABNORMAL HIGH (ref 70–99)

## 2013-08-07 LAB — RENAL FUNCTION PANEL
Albumin: 2.2 g/dL — ABNORMAL LOW (ref 3.5–5.2)
BUN: 48 mg/dL — ABNORMAL HIGH (ref 6–23)
CO2: 22 mEq/L (ref 19–32)
Chloride: 90 mEq/L — ABNORMAL LOW (ref 96–112)
Creatinine, Ser: 9.23 mg/dL — ABNORMAL HIGH (ref 0.50–1.35)
GFR calc Af Amer: 6 mL/min — ABNORMAL LOW (ref >90)
Glucose, Bld: 172 mg/dL — ABNORMAL HIGH (ref 70–99)
Potassium: 4.3 mEq/L (ref 3.5–5.1)

## 2013-08-07 LAB — HEMOGLOBIN AND HEMATOCRIT, BLOOD: HCT: 30.2 % — ABNORMAL LOW (ref 39.0–52.0)

## 2013-08-07 LAB — HEPATITIS B SURFACE ANTIGEN: Hepatitis B Surface Ag: NEGATIVE

## 2013-08-07 MED ORDER — SODIUM CHLORIDE 0.9 % IJ SOLN
10.0000 mL | Freq: Two times a day (BID) | INTRAMUSCULAR | Status: DC
  Administered 2013-08-07: 10 mL
  Administered 2013-08-07: 20 mL
  Administered 2013-08-08: 10 mL
  Administered 2013-08-09: 40 mL
  Administered 2013-08-10: 30 mL
  Administered 2013-08-10: 10 mL
  Administered 2013-08-11: 30 mL
  Administered 2013-08-11 – 2013-08-12 (×2): 10 mL

## 2013-08-07 MED ORDER — CHLORHEXIDINE GLUCONATE 4 % EX LIQD
1.0000 "application " | Freq: Once | CUTANEOUS | Status: AC
  Administered 2013-08-07: 1 via TOPICAL
  Filled 2013-08-07 (×2): qty 15

## 2013-08-07 MED ORDER — PIPERACILLIN-TAZOBACTAM IN DEX 2-0.25 GM/50ML IV SOLN
2.2500 g | Freq: Three times a day (TID) | INTRAVENOUS | Status: DC
  Administered 2013-08-07 – 2013-08-13 (×17): 2.25 g via INTRAVENOUS
  Filled 2013-08-07 (×24): qty 50

## 2013-08-07 MED ORDER — SODIUM CHLORIDE 0.9 % IJ SOLN
10.0000 mL | INTRAMUSCULAR | Status: DC | PRN
  Administered 2013-08-11 (×2): 10 mL

## 2013-08-07 MED ORDER — SEVELAMER CARBONATE 800 MG PO TABS
1600.0000 mg | ORAL_TABLET | Freq: Three times a day (TID) | ORAL | Status: DC
  Administered 2013-08-07 – 2013-08-13 (×18): 1600 mg via ORAL
  Filled 2013-08-07 (×18): qty 2

## 2013-08-07 NOTE — Progress Notes (Signed)
Subjective: No significant pain in right foot.  Objective: Vital signs in last 24 hours: Temp:  [98.2 F (36.8 C)-99.2 F (37.3 C)] 98.2 F (36.8 C) (12/11 0730) Pulse Rate:  [110] 110 (12/11 0700) Resp:  [17-30] 22 (12/11 0700) BP: (90-136)/(12-89) 130/89 mmHg (12/11 0700) SpO2:  [98 %] 98 % (12/11 0700) Last BM Date: 08/07/13  Intake/Output from previous day: 12/10 0701 - 12/11 0700 In: 786.5 [I.V.:786.5] Out: -  Intake/Output this shift:    General appearance: alert and cooperative Extremities: Gangrene of right foot stable. Wound VAC has been removed.  Lab Results:   Recent Labs  08/06/13 0408 08/07/13 0459  WBC 17.1* 20.4*  HGB 8.0* 8.0*  HCT 24.3* 24.6*  PLT 236 256   BMET  Recent Labs  08/06/13 0408 08/07/13 0459  NA 138 134*  K 3.9 4.3  CL 95* 90*  CO2 27 22  GLUCOSE 47* 172*  BUN 32* 48*  CREATININE 7.17* 9.23*  CALCIUM 10.4 10.4   PT/INR  Recent Labs  08/05/13 1340  LABPROT 19.6*  INR 1.71*    Studies/Results: Dg Foot 2 Views Right  08/05/2013   CLINICAL DATA:  History of ulcer involving the 1st digit findings performed evaluate for changes secondary to osteomyelitis  EXAM: RIGHT FOOT - 2 VIEW  COMPARISON:  Great toe evaluation 07/07/2013  FINDINGS: There is subcutaneous emphysema and ulceration involving the soft tissues overlying the head of the 1st metatarsal. Cortical destruction is identified along the medial and lateral periphery of the head of the 1st metatarsal. A drainage catheter likely representing a wound VAC is appreciated within the ulcer. Atherosclerotic calcification appreciated. There is osteopenia which may represent hyperemia considering the findings. There is no evidence of acute fracture nor dislocation.  IMPRESSION: Findings consistent with osteomyelitis involving the head of the 1st metatarsal until proven otherwise. Dr. Effie Shy of the [facility] emergency department was informed of these findings at the time of  initial interpretation via telephone conversation.   Electronically Signed   By: Salome Holmes M.D.   On: 08/05/2013 15:46    Anti-infectives: Anti-infectives   Start     Dose/Rate Route Frequency Ordered Stop   08/07/13 1200  vancomycin (VANCOCIN) IVPB 1000 mg/200 mL premix     1,000 mg 200 mL/hr over 60 Minutes Intravenous Every T-Th-Sa (Hemodialysis) 08/05/13 1847     08/07/13 1200  cefTAZidime (FORTAZ) 2 g in dextrose 5 % 50 mL IVPB  Status:  Discontinued     2 g 100 mL/hr over 30 Minutes Intravenous Every T-Th-Sa (Hemodialysis) 08/05/13 1847 08/07/13 0758   08/07/13 1000  piperacillin-tazobactam (ZOSYN) IVPB 2.25 g     2.25 g 100 mL/hr over 30 Minutes Intravenous Every 8 hours 08/07/13 0812     08/05/13 2000  vancomycin (VANCOCIN) 1,500 mg in sodium chloride 0.9 % 500 mL IVPB  Status:  Discontinued     1,500 mg 250 mL/hr over 120 Minutes Intravenous  Once 08/05/13 1820 08/05/13 1839   08/05/13 1901  cefTAZidime (FORTAZ) 2 g in dextrose 5 % 50 mL IVPB     2 g 100 mL/hr over 30 Minutes Intravenous  Once 08/05/13 1820 08/05/13 2032      Assessment/Plan: Impression: To receive dialysis today. Will then undergo right below-the-knee amputation tomorrow. The risks and benefits of the procedure were fully explained to the patient, who gave informed consent. She will receive blood transfusions today which she has consented to. Will need to stop heparin drip at midnight  tonight.  LOS: 2 days    Nusrat Encarnacion A 08/07/2013

## 2013-08-07 NOTE — Progress Notes (Signed)
Subjective: Interval History: has no complaint of difficulty in breathing. Presently patient is still that he's feeling better. He denies any nausea or vomiting..  Objective: Vital signs in last 24 hours: Temp:  [98.3 F (36.8 C)-99.2 F (37.3 C)] 98.9 F (37.2 C) (12/11 0400) Resp:  [17-30] 29 (12/11 0600) BP: (90-136)/(12-75) 91/62 mmHg (12/11 0300) SpO2:  [100 %] 100 % (12/10 0900) Weight change:   Intake/Output from previous day: 12/10 0701 - 12/11 0700 In: 786.5 [I.V.:786.5] Out: -  Intake/Output this shift:    General appearance: alert, cooperative and no distress Resp: clear to auscultation bilaterally Cardio: regular rate and rhythm GI: soft, non-tender; bowel sounds normal; no masses,  no organomegaly Extremities: extremities normal, atraumatic, no cyanosis or edema  Lab Results:  Recent Labs  08/06/13 0408 08/07/13 0459  WBC 17.1* 20.4*  HGB 8.0* 8.0*  HCT 24.3* 24.6*  PLT 236 256   BMET:  Recent Labs  08/06/13 0408 08/07/13 0459  NA 138 134*  K 3.9 4.3  CL 95* 90*  CO2 27 22  GLUCOSE 47* 172*  BUN 32* 48*  CREATININE 7.17* 9.23*  CALCIUM 10.4 10.4   No results found for this basename: PTH,  in the last 72 hours Iron Studies: No results found for this basename: IRON, TIBC, TRANSFERRIN, FERRITIN,  in the last 72 hours  Studies/Results: Dg Foot 2 Views Right  08/05/2013   CLINICAL DATA:  History of ulcer involving the 1st digit findings performed evaluate for changes secondary to osteomyelitis  EXAM: RIGHT FOOT - 2 VIEW  COMPARISON:  Great toe evaluation 07/07/2013  FINDINGS: There is subcutaneous emphysema and ulceration involving the soft tissues overlying the head of the 1st metatarsal. Cortical destruction is identified along the medial and lateral periphery of the head of the 1st metatarsal. A drainage catheter likely representing a wound VAC is appreciated within the ulcer. Atherosclerotic calcification appreciated. There is osteopenia which may  represent hyperemia considering the findings. There is no evidence of acute fracture nor dislocation.  IMPRESSION: Findings consistent with osteomyelitis involving the head of the 1st metatarsal until proven otherwise. Dr. Effie Shy of the [facility] emergency department was informed of these findings at the time of initial interpretation via telephone conversation.   Electronically Signed   By: Salome Holmes M.D.   On: 08/05/2013 15:46    I have reviewed the patient's current medications.  Assessment/Plan: Problem #1 end-stage renal disease she status post hemodialysis on Tuesday. His BUN is 48 creatinine is 9.3 and potassium is 4.3. Patient is for dialysis today. Problem #2 history of fever, chills and hypotension. Presently has improved. Patient on antibiotics and he has cellulitis/osteomyelitis of right foot. Problem #3 anemia:. Probably multifactorial including iron deficiency anemia and anemia of chronic disease. His hemoglobin and hematocrit is low but stable. Problem #4 history of diabetes Problem #5 a trial fibrillation Problem #6 history of coronary artery disease status post CABG Problem #7 status post mitral valve replacement  Problem #8 metabolic bone disease his calcium is was in acceptable range however his phosphorus is high. Plan: We'll do dialysis today           We'll start him on Renvela 800 mg 2 tablets by mouth 3 times a day with meals.           We'll continue his Epogen and consider blood transfusion as needed.   LOS: 2 days   Marinus Eicher S 08/07/2013,7:03 AM

## 2013-08-07 NOTE — Progress Notes (Signed)
ANTICOAGULATION CONSULT NOTE -  Pharmacy Consult for Heparin Indication: atrial fibrillation  Allergies  Allergen Reactions  . Bacitracin Other (See Comments)    unknown  . Norvasc [Amlodipine Besylate] Other (See Comments)    unknown   Patient Measurements: Height: 6\' 4"  (193 cm) Weight: 234 lb 5.6 oz (106.3 kg) IBW/kg (Calculated) : 86.8 Heparin Dosing Weight: 94Kg  Vital Signs: Temp: 98.2 F (36.8 C) (12/11 0730) Temp src: Oral (12/11 0730) BP: 130/89 mmHg (12/11 0700) Pulse Rate: 110 (12/11 0700)  Labs:  Recent Labs  08/05/13 1340  08/05/13 1348 08/06/13 0408 08/06/13 1617 08/07/13 0137 08/07/13 0459  HGB  --   < > 8.0* 8.0*  --   --  8.0*  HCT  --   --  24.6* 24.3*  --   --  24.6*  PLT  --   --  259 236  --   --  256  LABPROT 19.6*  --   --   --   --   --   --   INR 1.71*  --   --   --   --   --   --   HEPARINUNFRC  --   --   --   --  <0.10* 0.24*  --   CREATININE  --   --  5.45* 7.17*  --   --  9.23*  < > = values in this interval not displayed.  Estimated Creatinine Clearance: 10.8 ml/min (by C-G formula based on Cr of 9.23).  Medical History: Past Medical History  Diagnosis Date  . UGI bleed 02/12/11    on anticoagulation; EGD w/snare polypectomy-multiple polypoid lesions antrum(benign), Chronic active gastritis (NEGATIVE H pylori)  . Anemia, chronic disease   . Sepsis due to enterococcus 02/11/11  . Atrial fibrillation     coumadin  . CAD (coronary artery disease)     s/p CABGx4  . Cardiomyopathy     40-45% EF 6/12  . Diabetes mellitus   . Hypertension   . Hyperlipemia   . Gout   . S/P patent foramen ovale closure   . History of nuclear stress test 11/2011    negative myoview  . End stage kidney disease     T/TH/SAT dialyis, Dr Fausto Skillern  . Gout   . History of nuclear stress test 07/04/2010    dipyridamole; mod fixed inferolateral defect; non-diagnostic for ischemia; low risk scan     Medications:  Prescriptions prior to admission   Medication Sig Dispense Refill  . allopurinol (ZYLOPRIM) 100 MG tablet Take 100 mg by mouth daily.       Marland Kitchen amiodarone (PACERONE) 200 MG tablet Take 100 mg by mouth daily.       Marland Kitchen aspirin 81 MG tablet Take 81 mg by mouth daily.        Marland Kitchen atorvastatin (LIPITOR) 20 MG tablet Take 1 tablet (20 mg total) by mouth daily.  30 tablet  11  . B Complex-C-Zn-Folic Acid (DIALYVITE/ZINC PO) Take by mouth 3 (three) times a week. Patient has dialysis on Tuesdays, Thursdays, and Saturdays      . diltiazem (CARDIZEM CD) 180 MG 24 hr capsule Take 1 capsule (180 mg total) by mouth daily.  30 capsule  1  . folic acid (FOLVITE) 1 MG tablet Take 1 mg by mouth daily.        Marland Kitchen glimepiride (AMARYL) 4 MG tablet Take 4 mg by mouth 2 (two) times daily.        . Insulin  Glargine (LANTUS SOLOSTAR) 100 UNIT/ML SOPN Inject 38 Units into the skin at bedtime.      . metoprolol (LOPRESSOR) 100 MG tablet Take 1 tablet (100 mg total) by mouth 2 (two) times daily.  60 tablet  1  . Multiple Vitamin (MULTIVITAMIN) capsule Take 1 capsule by mouth daily.      . pantoprazole (PROTONIX) 40 MG tablet Take 40 mg by mouth 2 (two) times daily.       Marland Kitchen RENVELA 800 MG tablet Take 800 mg by mouth 3 (three) times daily with meals.       . torsemide (DEMADEX) 20 MG tablet Take 20 mg by mouth 2 (two) times daily.      . traZODone (DESYREL) 50 MG tablet Take 50 mg by mouth at bedtime.      Marland Kitchen warfarin (COUMADIN) 2.5 MG tablet Take 1.25 mg by mouth daily.        Assessment: 62yo male admitted for osteomyelitis and suspected sepsis.  Pt is on Coumadin PTA for h/o afib.  Plan is for pt to have BKA tomorrow and asked to bridge anticoagulation with IV Heparin prior to surgery.  INR yesterday was 1.71.   Heparin is currently OFF due to IV access issues.  D/W RN-plan for surgeon to place central line.   Goal of Therapy:  Heparin level 0.3-0.7 units/ml Monitor platelets by anticoagulation protocol: Yes   Plan:  Resume heparin at 2000 units/hour if IV  access obtained. Plan to stop heparin at MN (if resumed) in anticipation of surgery Monitor CBC F/U plan  Elson Clan 08/07/2013,11:51 AM

## 2013-08-07 NOTE — Progress Notes (Signed)
ANTICOAGULATION CONSULT NOTE -  Pharmacy Consult for Heparin Indication: atrial fibrillation  Allergies  Allergen Reactions  . Bacitracin Other (See Comments)    unknown  . Norvasc [Amlodipine Besylate] Other (See Comments)    unknown   Patient Measurements: Height: 6\' 4"  (193 cm) Weight: 234 lb 5.6 oz (106.3 kg) IBW/kg (Calculated) : 86.8 Heparin Dosing Weight: 94Kg  Vital Signs: Temp: 98.9 F (37.2 C) (12/11 0400) Temp src: Oral (12/11 0400) BP: 91/62 mmHg (12/11 0300)  Labs:  Recent Labs  08/05/13 1340  08/05/13 1348 08/06/13 0408 08/06/13 1617 08/07/13 0137 08/07/13 0459  HGB  --   < > 8.0* 8.0*  --   --  8.0*  HCT  --   --  24.6* 24.3*  --   --  24.6*  PLT  --   --  259 236  --   --  256  LABPROT 19.6*  --   --   --   --   --   --   INR 1.71*  --   --   --   --   --   --   HEPARINUNFRC  --   --   --   --  <0.10* 0.24*  --   CREATININE  --   --  5.45* 7.17*  --   --  9.23*  < > = values in this interval not displayed.  Estimated Creatinine Clearance: 10.8 ml/min (by C-G formula based on Cr of 9.23).  Medical History: Past Medical History  Diagnosis Date  . UGI bleed 02/12/11    on anticoagulation; EGD w/snare polypectomy-multiple polypoid lesions antrum(benign), Chronic active gastritis (NEGATIVE H pylori)  . Anemia, chronic disease   . Sepsis due to enterococcus 02/11/11  . Atrial fibrillation     coumadin  . CAD (coronary artery disease)     s/p CABGx4  . Cardiomyopathy     40-45% EF 6/12  . Diabetes mellitus   . Hypertension   . Hyperlipemia   . Gout   . S/P patent foramen ovale closure   . History of nuclear stress test 11/2011    negative myoview  . End stage kidney disease     T/TH/SAT dialyis, Dr Fausto Skillern  . Gout   . History of nuclear stress test 07/04/2010    dipyridamole; mod fixed inferolateral defect; non-diagnostic for ischemia; low risk scan     Medications:  Prescriptions prior to admission  Medication Sig Dispense Refill  .  allopurinol (ZYLOPRIM) 100 MG tablet Take 100 mg by mouth daily.       Marland Kitchen amiodarone (PACERONE) 200 MG tablet Take 100 mg by mouth daily.       Marland Kitchen aspirin 81 MG tablet Take 81 mg by mouth daily.        Marland Kitchen atorvastatin (LIPITOR) 20 MG tablet Take 1 tablet (20 mg total) by mouth daily.  30 tablet  11  . B Complex-C-Zn-Folic Acid (DIALYVITE/ZINC PO) Take by mouth 3 (three) times a week. Patient has dialysis on Tuesdays, Thursdays, and Saturdays      . diltiazem (CARDIZEM CD) 180 MG 24 hr capsule Take 1 capsule (180 mg total) by mouth daily.  30 capsule  1  . folic acid (FOLVITE) 1 MG tablet Take 1 mg by mouth daily.        Marland Kitchen glimepiride (AMARYL) 4 MG tablet Take 4 mg by mouth 2 (two) times daily.        . Insulin Glargine (LANTUS SOLOSTAR) 100 UNIT/ML  SOPN Inject 38 Units into the skin at bedtime.      . metoprolol (LOPRESSOR) 100 MG tablet Take 1 tablet (100 mg total) by mouth 2 (two) times daily.  60 tablet  1  . Multiple Vitamin (MULTIVITAMIN) capsule Take 1 capsule by mouth daily.      . pantoprazole (PROTONIX) 40 MG tablet Take 40 mg by mouth 2 (two) times daily.       Marland Kitchen RENVELA 800 MG tablet Take 800 mg by mouth 3 (three) times daily with meals.       . torsemide (DEMADEX) 20 MG tablet Take 20 mg by mouth 2 (two) times daily.      . traZODone (DESYREL) 50 MG tablet Take 50 mg by mouth at bedtime.      Marland Kitchen warfarin (COUMADIN) 2.5 MG tablet Take 1.25 mg by mouth daily.        Assessment: 62yo male admitted for osteomyelitis and suspected sepsis.  Pt is on Coumadin PTA for h/o afib.  Plan is for pt to have BKA tomorrow and asked to bridge anticoagulation with IV Heparin prior to surgery.  INR yesterday was 1.71.   Heparin level below goal after bolus and rate increase.  Goal of Therapy:  Heparin level 0.3-0.7 units/ml Monitor platelets by anticoagulation protocol: Yes   Plan:  Increase heparin to 2000 units/hour Heparin level in 6 hours and daily Plan to stop heparin tomorrow am in  anticipation of surgery Monitor CBC  Raquel Woods, Virginie Josten Bennett 08/07/2013,7:22 AM

## 2013-08-07 NOTE — Progress Notes (Signed)
PICC line was ordered for pt by Dr. Sherrie Mustache. PICC was attempted, but unsuccessful. Left basilic vein was tried, but the wire wouldn't advance, kept buckling in the arm. Lt brachial vein was tried, but pt c/o feeling pain in his hand, so needle was withdrawn. No left cephalic vein was seen. Pt receives dialysis in right arm, so it is restricted for use. Pt tolerated procedure well and states understanding of the situation. All questions by the pt were answered. Pt's nurse, Benjaman Lobe, RN, was notified of pt's unfortunate situation.  She has paged Dr. Sherrie Mustache to notify her to see if she wants pt sent to [facility] in [place] for PICC to be placed with Interventional Radiology, central line placed, or other alternatives.

## 2013-08-07 NOTE — Procedures (Signed)
   HEMODIALYSIS TREATMENT NOTE:  HD delayed due to attempts to insert PICC and then due to placement of central line.  3.5 hour heparin-free dialysis completed via right forearm AVF (15g/antegrade).  Goal met:  Tolerated removal of 1.3 liters with no interruption in ultrafiltration.  Pt received 2 units PRBCs, EPO 10K, Vancomycin 1g with HD.  Zosyn started at end of treatment; all antibiotics delayed due to no IV access.  All blood was reinfused.  Hemostasis was achieved within 15 minutes.  Report given to Benjaman Lobe, RN.  Nihira Puello L. Delise Simenson, RN, CDN

## 2013-08-07 NOTE — Progress Notes (Signed)
[facility] PROGRESS NOTE  [name] [name] ZOX:[id number] DOB: 1951/03/27 DOA: 08/05/2013 PCP: Cassell Smiles., MD    Code Status: Full code Family Communication: Family not available currently Disposition Plan: To be determined, the patient may require short-term skilled nursing facility placement following surgery.   Consultants:  General surgeon, Dr. Lovell Sheehan  Nephrology   Procedures:  Hemodialysis  PICC line pending  Antibiotics:  IV vancomycin 08/05/2013>>  Zosyn 08/07/2013  IV ceftaz 08/05/2013>> 08/07/2013   HPI/Subjective: The patient is has some right foot soreness. Otherwise he has no complaints. Nursing reports loss of IV access.  Objective: Filed Vitals:   08/07/13 0730  BP:   Pulse:   Temp: 98.2 F (36.8 C)  Resp:     T. current 98.2. Afebrile overnight Pulse 105-110. Oxygen saturation 98%    Intake/Output Summary (Last 24 hours) at 08/07/13 8119 Last data filed at 08/07/13 0100  Gross per 24 hour  Intake  751.5 ml  Output      0 ml  Net  751.5 ml   Filed Weights   08/05/13 1331 08/05/13 1814 08/06/13 0500  Weight: 105.235 kg (232 lb) 104.3 kg (229 lb 15 oz) 106.3 kg (234 lb 5.6 oz)    Exam:   General:  pleasant alert 62 year old [name] laying in bed, in no acute distress. No evidence of diaphoresis or tremor.  Cardiovascular: irregular, irregular, with mild tachycardia.  Respiratory: occasional crackles auscultated in the bases. Breathing is nonlabored.  Abdomen: positive bowel sounds, soft, nontender, nondistended.  Musculoskeletal/extremities: Wound VAC removed from right foot. Dressing in place, not removed. There is a small amount of bloody drainage on the dressing. The right leg is globally edematous and mildly tender; mild erythema, resolving.   Neurologic: He is alert and oriented x2. Cranial nerves II through XII are grossly intact.  Data Reviewed: Basic Metabolic Panel:  Recent Labs Lab 08/05/13 1348  08/06/13 0408 08/07/13 0459  NA 137 138 134*  K 3.6 3.9 4.3  CL 94* 95* 90*  CO2 24 27 22   GLUCOSE 98 47* 172*  BUN 22 32* 48*  CREATININE 5.45* 7.17* 9.23*  CALCIUM 9.4 10.4 10.4  PHOS  --   --  7.2*   Liver Function Tests:  Recent Labs Lab 08/07/13 0459  ALBUMIN 2.2*   No results found for this basename: LIPASE, AMYLASE,  in the last 168 hours No results found for this basename: AMMONIA,  in the last 168 hours CBC:  Recent Labs Lab 08/05/13 1348 08/06/13 0408 08/07/13 0459  WBC 19.4* 17.1* 20.4*  NEUTROABS 17.3*  --   --   HGB 8.0* 8.0* 8.0*  HCT 24.6* 24.3* 24.6*  MCV 92.5 92.7 92.5  PLT 259 236 256   Cardiac Enzymes: No results found for this basename: CKTOTAL, CKMB, CKMBINDEX, TROPONINI,  in the last 168 hours BNP (last 3 results) No results found for this basename: PROBNP,  in the last 8760 hours CBG:  Recent Labs Lab 08/06/13 0830 08/06/13 1142 08/06/13 1645 08/06/13 2029 08/07/13 0747  GLUCAP 124* 116* 114* 141* 173*    Recent Results (from the past 240 hour(s))  CULTURE, BLOOD (ROUTINE X 2)     Status: None   Collection Time    08/05/13  3:04 PM      Result Value Range Status   Specimen Description BLOOD LEFT HAND   Final   Special Requests BOTTLES DRAWN AEROBIC AND ANAEROBIC 6CC   Final   Culture NO GROWTH 1 DAY  Final   Report Status PENDING   Incomplete  CULTURE, BLOOD (ROUTINE X 2)     Status: None   Collection Time    08/05/13  3:13 PM      Result Value Range Status   Specimen Description BLOOD LEFT ARM   Final   Special Requests BOTTLES DRAWN AEROBIC ONLY 4CC   Final   Culture NO GROWTH 1 DAY   Final   Report Status PENDING   Incomplete     Studies: Dg Foot 2 Views Right  08/05/2013   CLINICAL DATA:  History of ulcer involving the 1st digit findings performed evaluate for changes secondary to osteomyelitis  EXAM: RIGHT FOOT - 2 VIEW  COMPARISON:  Great toe evaluation 07/07/2013  FINDINGS: There is subcutaneous emphysema and  ulceration involving the soft tissues overlying the head of the 1st metatarsal. Cortical destruction is identified along the medial and lateral periphery of the head of the 1st metatarsal. A drainage catheter likely representing a wound VAC is appreciated within the ulcer. Atherosclerotic calcification appreciated. There is osteopenia which may represent hyperemia considering the findings. There is no evidence of acute fracture nor dislocation.  IMPRESSION: Findings consistent with osteomyelitis involving the head of the 1st metatarsal until proven otherwise. Dr. Effie Shy of the [facility] emergency department was informed of these findings at the time of initial interpretation via telephone conversation.   Electronically Signed   By: Salome Holmes M.D.   On: 08/05/2013 15:46    Scheduled Meds: . amiodarone  100 mg Oral Daily  . atorvastatin  20 mg Oral q1800  . epoetin alfa  10,000 Units Intravenous 3 times weekly  . feeding supplement (NEPRO CARB STEADY)  237 mL Oral BID BM  . feeding supplement (PRO-STAT SUGAR FREE 64)  30 mL Oral TID WC  . folic acid  1 mg Oral Daily  . insulin aspart  0-9 Units Subcutaneous TID WC  . metoprolol tartrate  12.5 mg Oral TID  . multivitamin with minerals  1 tablet Oral Daily  . pantoprazole  40 mg Oral BID  . piperacillin-tazobactam (ZOSYN)  IV  2.25 g Intravenous Q8H  . sevelamer carbonate  1,600 mg Oral TID WC  . sodium chloride  3 mL Intravenous Q12H  . traZODone  50 mg Oral QHS  . vancomycin  1,000 mg Intravenous Q T,Th,Sa-HD   Continuous Infusions: . dextrose 5 % and 0.45% NaCl 40 mL/hr at 08/07/13 0100  . heparin 2,000 Units/hr (08/07/13 0352)    Assessment:  Principal Problem:   Sepsis Active Problems:   Osteomyelitis of right foot   Atherosclerosis of native arteries of the extremities with gangrene   Atrial fibrillation   Shirer term (current) use of anticoagulants   CAD (coronary artery disease)   ESRD (end stage renal disease) on  dialysis   DM type 2 causing ESRD   Hypotension   Hypoglycemia associated with diabetes   Anemia of chronic disease     1.Osteomyelitis of the first metatarsal with associated cellulitis and gangrene of the right foot secondary to peripheral vascular disease. 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 Previously treated with a wound VAC on the right foot per Dr. Lovell Sheehan; now removed. However, the infection has progressed and Dr. Lovell Sheehan recommends a below-the-knee amputation. He is now afebrile. His white blood cell count has increased overnight, likely secondary to a loss of IV access. Nevertheless, we'll discontinue ceftaz and start Zosyn instead. Continue vancomycin.  Sepsis secondary to #1.  Noted lactic acidosis on admission. Sepsis appears to be resolving. Continue antibiotics and supportive treatment as above.  Chronic atrial fibrillation with rapid ventricular response. Overall, his heart rate has been ranging from 105-110 which is reasonable under the circumstances. There may be some rebound tachycardia. We'll continue amiodarone. Diltiazem is on hold secondary to low-normal blood pressures. Metoprolol was restarted at lower dosing. As his blood pressure improves, will restart diltiazem M.  Chronic anticoagulation with warfarin secondary to atrial fibrillation. Coumadin is on hold because of the upcoming operation. IV heparin was ordered. It will need to be discontinued overnight for the upcoming operation.  Coronary artery disease/ischemic cardiomyopathy. Aspirin and metoprolol are on hold because of upcoming operation and low-normal blood pressures. Torsemide is on hold, but he is scheduled for hemodialysis. Metoprolol was restarted at a reduced dosing.  Diabetes mellitus. Episodes of hypoglycemia on 12/10 was treated with an amp of D50 and gentle dextrose infusion and decrease in insulin. Now resolved.  Continue sliding scale NovoLog. We'll make adjustment in insulin dosing  accordingly.  End-stage renal disease. Nephrology consulted. Hemodialysis per nephrology. Appreciate nephrology's assistance.  Anemia of critical illness and chronic disease. Discussed with Dr. Lovell Sheehan who recommends transfusion with anticipated blood loss during the operation. Transfusion ordered. Nephrology also ordered Epogen.   1. As above. 2. Transfuse 2 units of packed red blood cells with dialysis. 3. Ceftaz discontinued in favor of Zosyn. 4. PICC line. 5. Discontinue dextrose infusion.   Time spent: 40 minutes.    [facility]  [facility] Pager [phone]. If 7PM-7AM, please contact night-coverage at [email or web address], password [facility] 08/07/2013, 9:39 AM  LOS: 2 days

## 2013-08-07 NOTE — Progress Notes (Signed)
IV access lost. Pt accidentally pulled IV out. MD made aware. OK'd by MD to leave IV out as pt had been stuck  4 times unsuccessfully for new IV

## 2013-08-07 NOTE — Progress Notes (Signed)
ANTIBIOTIC CONSULT NOTE  Pharmacy Consult for Vancomycin and Zosyn Indication: Osteomyelitis Right Foot  Allergies  Allergen Reactions  . Bacitracin Other (See Comments)    unknown  . Norvasc [Amlodipine Besylate] Other (See Comments)    unknown    Patient Measurements: Height: 6\' 4"  (193 cm) Weight: 234 lb 5.6 oz (106.3 kg) IBW/kg (Calculated) : 86.8  Vital Signs: Temp: 98.2 F (36.8 C) (12/11 0730) Temp src: Oral (12/11 0730) BP: 130/89 mmHg (12/11 0700) Pulse Rate: 110 (12/11 0700) Intake/Output from previous day: 12/10 0701 - 12/11 0700 In: 786.5 [I.V.:786.5] Out: -  Intake/Output from this shift:    Labs:  Recent Labs  08/05/13 1348 08/06/13 0408 08/07/13 0459  WBC 19.4* 17.1* 20.4*  HGB 8.0* 8.0* 8.0*  PLT 259 236 256  CREATININE 5.45* 7.17* 9.23*   Estimated Creatinine Clearance: 10.8 ml/min (by C-G formula based on Cr of 9.23). No results found for this basename: VANCOTROUGH, Leodis Binet, VANCORANDOM, GENTTROUGH, GENTPEAK, GENTRANDOM, TOBRATROUGH, TOBRAPEAK, TOBRARND, AMIKACINPEAK, AMIKACINTROU, AMIKACIN,  in the last 72 hours   Microbiology: Recent Results (from the past 720 hour(s))  MRSA PCR SCREENING     Status: None   Collection Time    07/08/13  7:00 PM      Result Value Range Status   MRSA by PCR NEGATIVE  NEGATIVE Final   Comment:            The GeneXpert MRSA Assay (FDA     approved for NASAL specimens     only), is one component of a     comprehensive MRSA colonization     surveillance program. It is not     intended to diagnose MRSA     infection nor to guide or     monitor treatment for     MRSA infections.  CULTURE, BLOOD (ROUTINE X 2)     Status: None   Collection Time    08/05/13  3:04 PM      Result Value Range Status   Specimen Description BLOOD LEFT HAND   Final   Special Requests BOTTLES DRAWN AEROBIC AND ANAEROBIC 6CC   Final   Culture NO GROWTH 2 DAYS   Final   Report Status PENDING   Incomplete  CULTURE, BLOOD (ROUTINE  X 2)     Status: None   Collection Time    08/05/13  3:13 PM      Result Value Range Status   Specimen Description BLOOD LEFT ARM   Final   Special Requests BOTTLES DRAWN AEROBIC ONLY 4CC   Final   Culture NO GROWTH 2 DAYS   Final   Report Status PENDING   Incomplete    Medical History: Past Medical History  Diagnosis Date  . UGI bleed 02/12/11    on anticoagulation; EGD w/snare polypectomy-multiple polypoid lesions antrum(benign), Chronic active gastritis (NEGATIVE H pylori)  . Anemia, chronic disease   . Sepsis due to enterococcus 02/11/11  . Atrial fibrillation     coumadin  . CAD (coronary artery disease)     s/p CABGx4  . Cardiomyopathy     40-45% EF 6/12  . Diabetes mellitus   . Hypertension   . Hyperlipemia   . Gout   . S/P patent foramen ovale closure   . History of nuclear stress test 11/2011    negative myoview  . End stage kidney disease     T/TH/SAT dialyis, Dr Fausto Skillern  . Gout   . History of nuclear stress test 07/04/2010  dipyridamole; mod fixed inferolateral defect; non-diagnostic for ischemia; low risk scan     Medications:  Scheduled:  . amiodarone  100 mg Oral Daily  . atorvastatin  20 mg Oral q1800  . chlorhexidine  1 application Topical Once  . epoetin alfa  10,000 Units Intravenous 3 times weekly  . feeding supplement (NEPRO CARB STEADY)  237 mL Oral BID BM  . feeding supplement (PRO-STAT SUGAR FREE 64)  30 mL Oral TID WC  . folic acid  1 mg Oral Daily  . insulin aspart  0-9 Units Subcutaneous TID WC  . metoprolol tartrate  12.5 mg Oral TID  . multivitamin with minerals  1 tablet Oral Daily  . pantoprazole  40 mg Oral BID  . piperacillin-tazobactam (ZOSYN)  IV  2.25 g Intravenous Q8H  . sevelamer carbonate  1,600 mg Oral TID WC  . sodium chloride  3 mL Intravenous Q12H  . traZODone  50 mg Oral QHS  . vancomycin  1,000 mg Intravenous Q T,Th,Sa-HD   Assessment: 62 yo obese M with ESRD.  He is s/p right big toe amputation 07/14/2013 & is  scheduled for R BKA tomorrow due to worsening gangrene/osteomyelitis.   Patient is afebrile, but WBC is trending up. Antibiotics are being broadened to Zosyn & Vancomycin.  Zosyn must be administered q8h & IV access issues noted.  D/W RN.  Hemodialysis RN is administering initial dose prior to start of HD and central line access attempt is planned.   Goal of Therapy:  Pre-Hemodialysis Vancomycin level goal range =15-25 mcg/ml Eradicate infection.  Plan:  Continue Vancomycin 1000mg  IV every HD. Zosyn 2.25gm IV q8h Vancomycin level as clinically indicated. Monitor renal function and cx data  F/U ability to obtain IV access  Naomi Castrogiovanni, [facility] 08/07/2013,11:53 AM

## 2013-08-07 NOTE — Procedures (Signed)
Central Venous Catheter Insertion Procedure Note LOI RENNAKER [id number] 07-15-1951  Procedure: Insertion of Central Venous Catheter Indications: Assessment of intravascular volume, Drug and/or fluid administration and Frequent blood sampling  Procedure Details Consent: Risks of procedure as well as the alternatives and risks of each were explained to the (patient/caregiver).  Consent for procedure obtained. Time Out: Verified patient identification, verified procedure, site/side was marked, verified correct patient position, special equipment/implants available, medications/allergies/relevent history reviewed, required imaging and test results available.  Performed  Maximum sterile technique was used including antiseptics, cap, gloves, gown, hand hygiene, mask and sheet. Skin prep: Chlorhexidine; local anesthetic administered A antimicrobial bonded/coated triple lumen catheter was placed in the left femoral vein due to patient being a dialysis patient using the Seldinger technique.  Evaluation Blood flow good Complications: No apparent complications Patient did tolerate procedure well.   Bailea Beed A 08/07/2013, 12:15 PM

## 2013-08-08 ENCOUNTER — Inpatient Hospital Stay (HOSPITAL_COMMUNITY): Payer: Medicare Other | Admitting: Anesthesiology

## 2013-08-08 ENCOUNTER — Encounter (HOSPITAL_COMMUNITY): Payer: Self-pay | Admitting: *Deleted

## 2013-08-08 ENCOUNTER — Encounter (HOSPITAL_COMMUNITY): Payer: Medicare Other | Admitting: Anesthesiology

## 2013-08-08 ENCOUNTER — Encounter (HOSPITAL_COMMUNITY)
Admission: EM | Disposition: A | Discharge status: Skilled Nursing Facility | Payer: Self-pay | Source: Home / Self Care | Attending: Internal Medicine

## 2013-08-08 DIAGNOSIS — I2510 Atherosclerotic heart disease of native coronary artery without angina pectoris: Secondary | ICD-10-CM

## 2013-08-08 HISTORY — PX: AMPUTATION: SHX166

## 2013-08-08 LAB — PREPARE RBC (CROSSMATCH)

## 2013-08-08 LAB — RENAL FUNCTION PANEL
Albumin: 2 g/dL — ABNORMAL LOW (ref 3.5–5.2)
CO2: 28 mEq/L (ref 19–32)
Calcium: 10.2 mg/dL (ref 8.4–10.5)
Creatinine, Ser: 6.95 mg/dL — ABNORMAL HIGH (ref 0.50–1.35)
GFR calc Af Amer: 9 mL/min — ABNORMAL LOW (ref >90)
GFR calc non Af Amer: 7 mL/min — ABNORMAL LOW (ref >90)
Glucose, Bld: 188 mg/dL — ABNORMAL HIGH (ref 70–99)
Phosphorus: 5.1 mg/dL — ABNORMAL HIGH (ref 2.3–4.6)
Potassium: 4 mEq/L (ref 3.5–5.1)
Sodium: 136 mEq/L (ref 135–145)

## 2013-08-08 LAB — CBC
HCT: 27.1 % — ABNORMAL LOW (ref 39.0–52.0)
MCH: 30 pg (ref 26.0–34.0)
MCHC: 32.8 g/dL (ref 30.0–36.0)
RDW: 15.7 % — ABNORMAL HIGH (ref 11.5–15.5)
WBC: 18.5 10*3/uL — ABNORMAL HIGH (ref 4.0–10.5)

## 2013-08-08 LAB — GLUCOSE, CAPILLARY
Glucose-Capillary: 147 mg/dL — ABNORMAL HIGH (ref 70–99)
Glucose-Capillary: 183 mg/dL — ABNORMAL HIGH (ref 70–99)

## 2013-08-08 LAB — SURGICAL PCR SCREEN: MRSA, PCR: NEGATIVE

## 2013-08-08 SURGERY — AMPUTATION BELOW KNEE
Anesthesia: Spinal | Site: Leg Lower | Laterality: Right

## 2013-08-08 MED ORDER — POVIDONE-IODINE 10 % EX OINT
TOPICAL_OINTMENT | CUTANEOUS | Status: AC
  Filled 2013-08-08: qty 2

## 2013-08-08 MED ORDER — LACTATED RINGERS IV SOLN: INTRAVENOUS | Status: DC

## 2013-08-08 MED ORDER — PROPOFOL 10 MG/ML IV EMUL
INTRAVENOUS | Status: AC
  Filled 2013-08-08: qty 20

## 2013-08-08 MED ORDER — ALTEPLASE 2 MG IJ SOLR: 2.0000 mg | Freq: Once | INTRAMUSCULAR | Status: AC | PRN

## 2013-08-08 MED ORDER — SODIUM CHLORIDE BACTERIOSTATIC 0.9 % IJ SOLN
INTRAMUSCULAR | Status: AC
  Filled 2013-08-08: qty 10

## 2013-08-08 MED ORDER — POVIDONE-IODINE 10 % EX OINT
TOPICAL_OINTMENT | CUTANEOUS | Status: DC | PRN
  Administered 2013-08-08: 2 via TOPICAL

## 2013-08-08 MED ORDER — FENTANYL CITRATE 0.05 MG/ML IJ SOLN
25.0000 ug | INTRAMUSCULAR | Status: DC | PRN
  Administered 2013-08-09 (×2): 25 ug via INTRAVENOUS
  Filled 2013-08-08 (×2): qty 2

## 2013-08-08 MED ORDER — MIDAZOLAM HCL 2 MG/2ML IJ SOLN
1.0000 mg | INTRAMUSCULAR | Status: DC | PRN
  Administered 2013-08-08: 2 mg via INTRAVENOUS

## 2013-08-08 MED ORDER — FENTANYL CITRATE 0.05 MG/ML IJ SOLN
INTRAMUSCULAR | Status: DC | PRN
  Administered 2013-08-08: 25 ug via INTRAVENOUS

## 2013-08-08 MED ORDER — OXYCODONE HCL 5 MG PO TABS
5.0000 mg | ORAL_TABLET | ORAL | Status: DC | PRN
  Administered 2013-08-08 – 2013-08-12 (×7): 5 mg via ORAL
  Filled 2013-08-08 (×7): qty 1

## 2013-08-08 MED ORDER — PROPOFOL INFUSION 10 MG/ML OPTIME
INTRAVENOUS | Status: DC | PRN
  Administered 2013-08-08: 25 ug/kg/min via INTRAVENOUS

## 2013-08-08 MED ORDER — BUPIVACAINE IN DEXTROSE 0.75-8.25 % IT SOLN
INTRATHECAL | Status: DC | PRN
  Administered 2013-08-08: 15 mg via INTRATHECAL

## 2013-08-08 MED ORDER — ONDANSETRON HCL 4 MG/2ML IJ SOLN: 4.0000 mg | Freq: Once | INTRAMUSCULAR | Status: DC | PRN

## 2013-08-08 MED ORDER — SODIUM CHLORIDE 0.9 % IR SOLN
Status: DC | PRN
  Administered 2013-08-08: 1000 mL

## 2013-08-08 MED ORDER — SODIUM CHLORIDE 0.9 % IV SOLN: 100.0000 mL | INTRAVENOUS | Status: DC | PRN

## 2013-08-08 MED ORDER — PHENYLEPHRINE HCL 10 MG/ML IJ SOLN
INTRAMUSCULAR | Status: DC | PRN
  Administered 2013-08-08 (×3): 100 ug via INTRAVENOUS
  Administered 2013-08-08: 50 ug via INTRAVENOUS
  Administered 2013-08-08 (×3): 100 ug via INTRAVENOUS

## 2013-08-08 MED ORDER — FENTANYL CITRATE 0.05 MG/ML IJ SOLN
INTRAMUSCULAR | Status: AC
  Filled 2013-08-08: qty 2

## 2013-08-08 MED ORDER — EPOETIN ALFA 10000 UNIT/ML IJ SOLN
12000.0000 [IU] | INTRAMUSCULAR | Status: DC
  Administered 2013-08-09 – 2013-08-12 (×3): 12000 [IU] via INTRAVENOUS
  Filled 2013-08-08 (×4): qty 2

## 2013-08-08 MED ORDER — MIDAZOLAM HCL 5 MG/5ML IJ SOLN
INTRAMUSCULAR | Status: DC | PRN
  Administered 2013-08-08: 2 mg via INTRAVENOUS

## 2013-08-08 MED ORDER — MIDAZOLAM HCL 2 MG/2ML IJ SOLN
INTRAMUSCULAR | Status: AC
  Filled 2013-08-08: qty 2

## 2013-08-08 MED ORDER — BUPIVACAINE IN DEXTROSE 0.75-8.25 % IT SOLN
INTRATHECAL | Status: AC
  Filled 2013-08-08: qty 2

## 2013-08-08 MED ORDER — FENTANYL CITRATE 0.05 MG/ML IJ SOLN: 25.0000 ug | INTRAMUSCULAR | Status: DC | PRN

## 2013-08-08 MED ORDER — FENTANYL CITRATE 0.05 MG/ML IJ SOLN
25.0000 ug | INTRAMUSCULAR | Status: AC
  Administered 2013-08-08 (×2): 25 ug via INTRAVENOUS

## 2013-08-08 MED ORDER — HYDROMORPHONE HCL PF 1 MG/ML IJ SOLN
0.5000 mg | INTRAMUSCULAR | Status: DC | PRN
  Administered 2013-08-08: 0.5 mg via INTRAVENOUS
  Filled 2013-08-08: qty 1

## 2013-08-08 MED ORDER — PHENYLEPHRINE HCL 10 MG/ML IJ SOLN
INTRAMUSCULAR | Status: AC
  Filled 2013-08-08: qty 1

## 2013-08-08 SURGICAL SUPPLY — 37 items
BAG HAMPER (MISCELLANEOUS) ×2 IMPLANT
BANDAGE ELASTIC 6 VELCRO NS (GAUZE/BANDAGES/DRESSINGS) ×2 IMPLANT
BANDAGE GAUZE ELAST BULKY 4 IN (GAUZE/BANDAGES/DRESSINGS) ×2 IMPLANT
BLADE SAW RECIP 87.9 MT (BLADE) ×2 IMPLANT
BLADE SURG SZ10 CARB STEEL (BLADE) ×2 IMPLANT
BLADE SURG SZ20 CARB STEEL (BLADE) ×2 IMPLANT
BNDG COHESIVE 4X5 TAN NS LF (GAUZE/BANDAGES/DRESSINGS) ×2 IMPLANT
CLOTH BEACON ORANGE TIMEOUT ST (SAFETY) ×2 IMPLANT
COVER LIGHT HANDLE STERIS (MISCELLANEOUS) ×4 IMPLANT
ELECT REM PT RETURN 9FT ADLT (ELECTROSURGICAL) ×2
ELECTRODE REM PT RTRN 9FT ADLT (ELECTROSURGICAL) ×1 IMPLANT
GLOVE BIO SURGEON STRL SZ7.5 (GLOVE) ×2 IMPLANT
GLOVE BIOGEL PI IND STRL 7.5 (GLOVE) ×3 IMPLANT
GLOVE BIOGEL PI INDICATOR 7.5 (GLOVE) ×3
GLOVE ECLIPSE 7.0 STRL STRAW (GLOVE) ×4 IMPLANT
GLOVE EXAM NITRILE LRG STRL (GLOVE) ×2 IMPLANT
GOWN STRL REIN XL XLG (GOWN DISPOSABLE) ×6 IMPLANT
INST SET MAJOR BONE (KITS) ×2 IMPLANT
KIT ROOM TURNOVER APOR (KITS) ×2 IMPLANT
MANIFOLD NEPTUNE II (INSTRUMENTS) ×2 IMPLANT
NS IRRIG 1000ML POUR BTL (IV SOLUTION) ×2 IMPLANT
PACK BASIC LIMB (CUSTOM PROCEDURE TRAY) ×2 IMPLANT
PAD ABD 5X9 TENDERSORB (GAUZE/BANDAGES/DRESSINGS) ×4 IMPLANT
PAD ARMBOARD 7.5X6 YLW CONV (MISCELLANEOUS) ×2 IMPLANT
SET BASIN LINEN APH (SET/KITS/TRAYS/PACK) ×2 IMPLANT
SOL PREP PROV IODINE SCRUB 4OZ (MISCELLANEOUS) ×2 IMPLANT
SPONGE GAUZE 4X4 12PLY (GAUZE/BANDAGES/DRESSINGS) ×4 IMPLANT
SPONGE LAP 18X18 X RAY DECT (DISPOSABLE) ×2 IMPLANT
STAPLER VISISTAT 35W (STAPLE) ×2 IMPLANT
SUT BONE WAX W31G (SUTURE) ×2 IMPLANT
SUT PROLENE 2 0 FS (SUTURE) ×2 IMPLANT
SUT PROLENE 2 0 SH 30 (SUTURE) ×8 IMPLANT
SUT SILK 0 FSL (SUTURE) ×8 IMPLANT
SUT SILK 2 0 REEL (SUTURE) ×2 IMPLANT
SUT SILK 2 0 SH (SUTURE) ×10 IMPLANT
SUT VIC AB 2-0 CT1 27 (SUTURE) ×7
SUT VIC AB 2-0 CT1 TAPERPNT 27 (SUTURE) ×7 IMPLANT

## 2013-08-08 NOTE — Progress Notes (Signed)
Daphane Shepherd, NP was notified about pt's severe pain 10/10 and increased HR (up to 130s). Orders received. Continue to monitor pt. closely.

## 2013-08-08 NOTE — Progress Notes (Signed)
[facility] PROGRESS NOTE  [name] [name] WUJ:[id number] DOB: [birth year] DOA: 08/05/2013 PCP: Cassell Smiles., MD    Code Status: Full code Family Communication: Family not available currently Disposition Plan: To be determined, the patient may require short-term skilled nursing facility placement following surgery.   Consultants:  General surgeon, Dr. Lovell Sheehan  Nephrology   Procedures:  Hemodialysis 08/06/2013>>  Right femoral central line 08/07/2013 per Dr. Lovell Sheehan.  Antibiotics:  IV vancomycin 08/05/2013>>  Zosyn 08/07/2013  IV ceftaz 08/05/2013>> 08/07/2013   HPI/Subjective: The patient is has some right foot soreness. Otherwise he has no complaints. Nursing reports loss of IV access.  Objective: Filed Vitals:   08/08/13 0830  BP:   Pulse:   Temp: 98.4 F (36.9 C)  Resp:     T. current 98.4 . Afebrile overnight Pulse 67-110. Blood pressure 117/70 Oxygen saturation 94%    Intake/Output Summary (Last 24 hours) at 08/08/13 0844 Last data filed at 08/08/13 0830  Gross per 24 hour  Intake   1208 ml  Output   1300 ml  Net    -92 ml   Filed Weights   08/06/13 0500 08/07/13 1220 08/07/13 1624  Weight: 106.3 kg (234 lb 5.6 oz) 104.1 kg (229 lb 8 oz) 102.9 kg (226 lb 13.7 oz)    Exam:   General:  pleasant alert 62 year old [name] laying in bed, in no acute distress. No evidence of diaphoresis or tremor.  Cardiovascular: irregular, irregular, with mild tachycardia.  Respiratory: occasional crackles auscultated in the bases. Breathing is nonlabored.  Abdomen: positive bowel sounds, soft, nontender, nondistended.  Musculoskeletal/extremities: Wound VAC removed from right foot. Dressing in place, not removed. No obvious drainage on the dressing, but foot is malodorous. The right leg is globally edematous and mildly tender; mild erythema, resolving.   Neurologic: He is alert and oriented x2. Cranial nerves II through XII are grossly  intact.  Data Reviewed: Basic Metabolic Panel:  Recent Labs Lab 08/05/13 1348 08/06/13 0408 08/07/13 0459 08/08/13 0418  NA 137 138 134* 136  K 3.6 3.9 4.3 4.0  CL 94* 95* 90* 92*  CO2 24 27 22 28   GLUCOSE 98 47* 172* 188*  BUN 22 32* 48* 44*  CREATININE 5.45* 7.17* 9.23* 6.95*  CALCIUM 9.4 10.4 10.4 10.2  PHOS  --   --  7.2* 5.1*   Liver Function Tests:  Recent Labs Lab 08/07/13 0459 08/08/13 0418  ALBUMIN 2.2* 2.0*   No results found for this basename: LIPASE, AMYLASE,  in the last 168 hours No results found for this basename: AMMONIA,  in the last 168 hours CBC:  Recent Labs Lab 08/05/13 1348 08/06/13 0408 08/07/13 0459 08/07/13 1719 08/08/13 0418  WBC 19.4* 17.1* 20.4*  --  18.5*  NEUTROABS 17.3*  --   --   --   --   HGB 8.0* 8.0* 8.0* 10.0* 8.9*  HCT 24.6* 24.3* 24.6* 30.2* 27.1*  MCV 92.5 92.7 92.5  --  91.2  PLT 259 236 256  --  247   Cardiac Enzymes: No results found for this basename: CKTOTAL, CKMB, CKMBINDEX, TROPONINI,  in the last 168 hours BNP (last 3 results) No results found for this basename: PROBNP,  in the last 8760 hours CBG:  Recent Labs Lab 08/07/13 0747 08/07/13 1134 08/07/13 1654 08/07/13 2138 08/08/13 0756  GLUCAP 173* 142* 131* 157* 150*    Recent Results (from the past 240 hour(s))  CULTURE, BLOOD (ROUTINE X 2)     Status: None  Collection Time    08/05/13  3:04 PM      Result Value Range Status   Specimen Description BLOOD LEFT HAND   Final   Special Requests BOTTLES DRAWN AEROBIC AND ANAEROBIC 6CC   Final   Culture NO GROWTH 2 DAYS   Final   Report Status PENDING   Incomplete  CULTURE, BLOOD (ROUTINE X 2)     Status: None   Collection Time    08/05/13  3:13 PM      Result Value Range Status   Specimen Description BLOOD LEFT ARM   Final   Special Requests BOTTLES DRAWN AEROBIC ONLY 4CC   Final   Culture NO GROWTH 2 DAYS   Final   Report Status PENDING   Incomplete     Studies: No results found.  Scheduled  Meds: . amiodarone  100 mg Oral Daily  . atorvastatin  20 mg Oral q1800  . epoetin alfa  12,000 Units Intravenous 3 times weekly  . feeding supplement (NEPRO CARB STEADY)  237 mL Oral BID BM  . feeding supplement (PRO-STAT SUGAR FREE 64)  30 mL Oral TID WC  . folic acid  1 mg Oral Daily  . insulin aspart  0-9 Units Subcutaneous TID WC  . metoprolol tartrate  12.5 mg Oral TID  . multivitamin with minerals  1 tablet Oral Daily  . pantoprazole  40 mg Oral BID  . piperacillin-tazobactam (ZOSYN)  IV  2.25 g Intravenous Q8H  . sevelamer carbonate  1,600 mg Oral TID WC  . sodium chloride  10-40 mL Intracatheter Q12H  . sodium chloride  3 mL Intravenous Q12H  . traZODone  50 mg Oral QHS  . vancomycin  1,000 mg Intravenous Q T,Th,Sa-HD   Continuous Infusions:    Assessment:  Principal Problem:   Sepsis Active Problems:   Osteomyelitis of right foot   Atherosclerosis of native arteries of the extremities with gangrene   Atrial fibrillation   Caggiano term (current) use of anticoagulants   CAD (coronary artery disease)   ESRD (end stage renal disease) on dialysis   DM type 2 causing ESRD   Hypotension   Hypoglycemia associated with diabetes   Anemia of chronic disease     1.Osteomyelitis of the first metatarsal with associated cellulitis and gangrene of the right foot secondary to peripheral vascular disease. 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 Previously treated with a wound VAC on the right foot per Dr. Lovell Sheehan; now removed. However, the infection has progressed and Dr. Lovell Sheehan will perform a below-the-knee amputation. The patient is now afebrile. His white blood cell count has improved a little overnight, but still elevated. Antibiotic therapy was broadened on 08/07/2013 with Zosyn. Vancomycin continues.   Sepsis secondary to #1. Noted lactic acidosis on admission. Sepsis appears to be resolving. Continue antibiotics and supportive treatment as above.  Chronic atrial  fibrillation with rapid ventricular response. Overall, his heart rate has been ranging from 105-110 which is reasonable under the circumstances. There may be some rebound tachycardia. We'll continue amiodarone and reduced dosing of metoprolol. Diltiazem is on hold secondary to low-normal blood pressures; will restart as his blood pressure improves.  Chronic anticoagulation with warfarin secondary to atrial fibrillation. Coumadin is on hold because of the upcoming operation. IV heparin was ordered and is now on hold for the operation.  CAD/ischemic cardiomyopathy. Aspirin is  on hold because of upcoming operation.  Torsemide is on hold, but he is  receiving hemodialysis. Metoprolol was restarted  at a reduced dosing.  Diabetes mellitus. Episode of hypoglycemia on 12/10 was treated with an amp of D50 and gentle dextrose infusion and decrease in insulin. Now resolved.  Continue sliding scale NovoLog. We'll make adjustment in insulin dosing accordingly.  End-stage renal disease. Nephrology consulted. Hemodialysis per nephrology. Appreciate nephrology's assistance.  Anemia of critical illness and chronic disease. Discussed with Dr. Lovell Sheehan who recommended transfusion with anticipated blood loss during the operation. status post 2 units of packed red blood cell transfusion on 08/07/2013. His hemoglobin improved appropriately, but has decreased to below 9 g again. He may receive another unit during the operation. He is also receiving Epogen per nephrology.    1. continue management as above.    Time spent: 25 minutes.    [facility]  [facility] Pager [phone]. If 7PM-7AM, please contact night-coverage at [email or web address], password [facility] 08/08/2013, 8:44 AM  LOS: 3 days

## 2013-08-08 NOTE — Progress Notes (Signed)
Subjective: Interval History: He offers no complaint.Over all feels good  Objective: Vital signs in last 24 hours: Temp:  [97.9 F (36.6 C)-99 F (37.2 C)] 98.8 F (37.1 C) (12/12 0600) Pulse Rate:  [78-138] 119 (12/11 2300) Resp:  [18-33] 19 (12/12 0600) BP: (95-138)/(58-99) 120/70 mmHg (12/12 0600) SpO2:  [89 %-95 %] 89 % (12/11 2300) Weight:  [102.9 kg (226 lb 13.7 oz)-104.1 kg (229 lb 8 oz)] 102.9 kg (226 lb 13.7 oz) (12/11 1624) Weight change:   Intake/Output from previous day: 12/11 0701 - 12/12 0700 In: 1208 [P.O.:480; I.V.:50; Blood:628; IV Piggyback:50] Out: 1300  Intake/Output this shift:    General appearance: alert, cooperative and no distress Resp: clear to auscultation bilaterally Cardio: regular rate and rhythm GI: soft, non-tender; bowel sounds normal; no masses,  no organomegaly Extremities: extremities normal, atraumatic, no cyanosis or edema  Lab Results:  Recent Labs  08/07/13 0459 08/07/13 1719 08/08/13 0418  WBC 20.4*  --  18.5*  HGB 8.0* 10.0* 8.9*  HCT 24.6* 30.2* 27.1*  PLT 256  --  247   BMET:   Recent Labs  08/07/13 0459 08/08/13 0418  NA 134* 136  K 4.3 4.0  CL 90* 92*  CO2 22 28  GLUCOSE 172* 188*  BUN 48* 44*  CREATININE 9.23* 6.95*  CALCIUM 10.4 10.2   No results found for this basename: PTH,  in the last 72 hours Iron Studies: No results found for this basename: IRON, TIBC, TRANSFERRIN, FERRITIN,  in the last 72 hours  Studies/Results: No results found.  I have reviewed the patient's current medications.  Assessment/Plan: Problem #1 end-stage renal disease she status post hemodialysis yesterday. His BUN is 44 and his creatinine is 6.95 and potassium is 4.0. No uremic sign and symptoms Problem #2 history of fever, chills and hypotension. Presently has improved. Patient on antibiotics and he has cellulitis/osteomyelitis of right foot. For possible BKA today Problem #3 anemia:. Probably multifactorial including iron  deficiency anemia and anemia of chronic disease. His hemoglobin and hematocrit is low and dedclining Problem #4 history of diabetes Problem #5 a trial fibrillation Problem #6 history of coronary artery disease status post CABG Problem #7 status post mitral valve replacement  Problem #8 metabolic bone disease his calcium and phosphorus is with  in acceptable range. Plan: We'll do dialysis patient in am          Increase Epogen to 12,000 iv after each dialysis          Basic metabolic panel and CBC in am   LOS: 3 days   [name] [name] S 08/08/2013,7:42 AM

## 2013-08-08 NOTE — Anesthesia Postprocedure Evaluation (Signed)
  Anesthesia Post-op Note  Patient: [name] [name]  Procedure(s) Performed: Procedure(s): AMPUTATION BELOW KNEE (Right)  Patient Location: PACU  Anesthesia Type:Spinal  Level of Consciousness: awake, alert  and oriented  Airway and Oxygen Therapy: Patient Spontanous Breathing and Patient connected to face mask oxygen  Post-op Pain: none  Post-op Assessment: Post-op Vital signs reviewed, Patient's Cardiovascular Status Stable, Respiratory Function Stable, Patent Airway and No signs of Nausea or vomiting  Post-op Vital Signs: Reviewed and stable  Neo IV SBP  Up to92/58  Complications: No apparent anesthesia complications

## 2013-08-08 NOTE — Op Note (Signed)
Patient:  [name] [name]  DOB:  12-19-1950  MRN:  [id number]   Preop Diagnosis:  Gangrene, right foot  Postop Diagnosis:  Same  Procedure:  Right below-the-knee amputation  Surgeon:  Franky Macho, M.D.  Anes:  Spinal  Indications:  Patient is a 62 year old black male with peripheral vascular disease, diabetes mellitus, end-stage renal disease, status post a right great toe amputation in the recent past who has had progressive gangrenous changes of the right foot.  Procedure note:  The patient was placed in the supine position. After spinal anesthesia was Mr., the right leg was prepped and draped using usual sterile technique with Betadine. Surgical site confirmation was performed.  An incision was made anteriorly below the tibial tuberosity. The dissection was taken down to both the tibia and fibula. A periosteal elevator was used on both of these. Any vessels that were found were ligated using 0 and 2-0 silk suture ligatures. Significant amount calcification was present. A bone saw was then used to divide both the tibia and fibula. An amputation knife was then placed along the posterior aspect of the tibia and fibula and the soft tissue was excised distal to this. A posterior flap was then formed. Any bleeding vessels within the posterior flap were ligated using 2-0 silk suture ligatures. Bone wax was applied to the distal tibia and fibula. The fascia posteriorly was then attached to the fascia anteriorly using 2-0 Vicryl interrupted sutures. The posterior flap was then trimmed in order to fit the anterior incision line. 2-0 Prolene vertical mattress sutures were then used to reapproximate the skin. Small staples were also used. Betadine ointment was then applied to the incision. A dry sterile dressing and Ace wrap were then applied.  All tape and needle counts were correct the end of the procedure. Patient was transferred to PACU in stable condition.  Complications:  None  EBL:  450 cc    Specimen:  Right lower extremity

## 2013-08-08 NOTE — Transfer of Care (Signed)
Immediate Anesthesia Transfer of Care Note  Patient: [name] [name]  Procedure(s) Performed: Procedure(s): AMPUTATION BELOW KNEE (Right)  Patient Location: PACU  Anesthesia Type:Spinal  Level of Consciousness: awake, alert  and oriented  Airway & Oxygen Therapy: Patient Spontanous Breathing and Patient connected to face mask oxygen  Post-op Assessment: Report given to PACU RN  Post vital signs: Reviewed  Complications: No apparent anesthesia complications

## 2013-08-08 NOTE — Anesthesia Preprocedure Evaluation (Signed)
Anesthesia Evaluation  Patient identified by MRN, date of birth, ID band Patient awake    Reviewed: Allergy & Precautions, H&P , NPO status , Patient's Chart, lab work & pertinent test results, reviewed documented beta blocker date and time   Airway Mallampati: II TM Distance: >3 FB Neck ROM: Full    Dental  (+) Partial Upper and Partial Lower   Pulmonary former smoker,  breath sounds clear to auscultation        Cardiovascular hypertension, + CAD, + Past MI, + CABG and + Peripheral Vascular Disease + dysrhythmias Atrial Fibrillation + Valvular Problems/Murmurs (MVR) MR Rhythm:Irregular Rate:Normal     Neuro/Psych    GI/Hepatic GERD-  Controlled and Medicated,  Endo/Other  diabetes, Type 2  Renal/GU ESRFRenal disease     Musculoskeletal   Abdominal   Peds  Hematology  (+) Blood dyscrasia, anemia ,   Anesthesia Other Findings   Reproductive/Obstetrics                           Anesthesia Physical Anesthesia Plan  ASA: IV  Anesthesia Plan: Spinal   Post-op Pain Management:    Induction:   Airway Management Planned: Simple Face Mask  Additional Equipment:   Intra-op Plan:   Post-operative Plan:   Informed Consent: I have reviewed the patients History and Physical, chart, labs and discussed the procedure including the risks, benefits and alternatives for the proposed anesthesia with the patient or authorized representative who has indicated his/her understanding and acceptance.     Plan Discussed with:   Anesthesia Plan Comments:         Anesthesia Quick Evaluation

## 2013-08-08 NOTE — Anesthesia Procedure Notes (Signed)
Spinal  Patient location during procedure: OR Start time: 08/08/2013 1:07 PM Preanesthetic Checklist Completed: patient identified, site marked, surgical consent, pre-op evaluation, timeout performed, IV checked, risks and benefits discussed and monitors and equipment checked Spinal Block Patient position: right lateral decubitus Prep: Betadine Patient monitoring: heart rate, cardiac monitor, continuous pulse ox and blood pressure Approach: right paramedian Location: L3-4 Injection technique: single-shot Needle Needle type: Spinocan  Needle gauge: 22 G Needle length: 9 cm Assessment Sensory level: T8 Additional Notes ATTEMPTS:1 TRAY OZ:[id number] TRAY EXPIRATION DATE:08/2014

## 2013-08-09 DIAGNOSIS — Z7901 Long term (current) use of anticoagulants: Secondary | ICD-10-CM

## 2013-08-09 DIAGNOSIS — E1129 Type 2 diabetes mellitus with other diabetic kidney complication: Secondary | ICD-10-CM

## 2013-08-09 DIAGNOSIS — N186 End stage renal disease: Secondary | ICD-10-CM

## 2013-08-09 LAB — HEPATIC FUNCTION PANEL
ALT: 14 U/L (ref 0–53)
AST: 34 U/L (ref 0–37)
Albumin: 1.9 g/dL — ABNORMAL LOW (ref 3.5–5.2)
Bilirubin, Direct: 2.6 mg/dL — ABNORMAL HIGH (ref 0.0–0.3)
Total Bilirubin: 3.4 mg/dL — ABNORMAL HIGH (ref 0.3–1.2)

## 2013-08-09 LAB — GLUCOSE, CAPILLARY: Glucose-Capillary: 216 mg/dL — ABNORMAL HIGH (ref 70–99)

## 2013-08-09 LAB — CBC
HCT: 29.4 % — ABNORMAL LOW (ref 39.0–52.0)
Hemoglobin: 9.7 g/dL — ABNORMAL LOW (ref 13.0–17.0)
MCHC: 33 g/dL (ref 30.0–36.0)
MCV: 89.9 fL (ref 78.0–100.0)
RDW: 15.9 % — ABNORMAL HIGH (ref 11.5–15.5)
WBC: 21.3 10*3/uL — ABNORMAL HIGH (ref 4.0–10.5)

## 2013-08-09 LAB — RENAL FUNCTION PANEL
Albumin: 1.9 g/dL — ABNORMAL LOW (ref 3.5–5.2)
BUN: 59 mg/dL — ABNORMAL HIGH (ref 6–23)
Calcium: 10.2 mg/dL (ref 8.4–10.5)
Creatinine, Ser: 8.66 mg/dL — ABNORMAL HIGH (ref 0.50–1.35)
Phosphorus: 6.1 mg/dL — ABNORMAL HIGH (ref 2.3–4.6)

## 2013-08-09 MED ORDER — DILTIAZEM HCL 25 MG/5ML IV SOLN
5.0000 mg | Freq: Once | INTRAVENOUS | Status: AC
  Administered 2013-08-09: 5 mg via INTRAVENOUS
  Filled 2013-08-09: qty 5

## 2013-08-09 MED ORDER — AMIODARONE HCL 200 MG PO TABS
100.0000 mg | ORAL_TABLET | Freq: Two times a day (BID) | ORAL | Status: DC
  Administered 2013-08-09 – 2013-08-13 (×9): 100 mg via ORAL
  Filled 2013-08-09 (×9): qty 1

## 2013-08-09 MED ORDER — INSULIN GLARGINE 100 UNIT/ML ~~LOC~~ SOLN
10.0000 [IU] | Freq: Two times a day (BID) | SUBCUTANEOUS | Status: DC
  Administered 2013-08-09 – 2013-08-13 (×9): 10 [IU] via SUBCUTANEOUS
  Filled 2013-08-09 (×13): qty 0.1

## 2013-08-09 MED ORDER — DILTIAZEM HCL ER COATED BEADS 180 MG PO CP24
180.0000 mg | ORAL_CAPSULE | Freq: Every day | ORAL | Status: DC
  Administered 2013-08-09 – 2013-08-13 (×5): 180 mg via ORAL
  Filled 2013-08-09 (×5): qty 1

## 2013-08-09 NOTE — Progress Notes (Signed)
Subjective: Interval History: He complains of some pain of his right leg . Denies any fver chill or sweating  Objective: Vital signs in last 24 hours: Temp:  [97.7 F (36.5 C)-98.9 F (37.2 C)] 97.7 F (36.5 C) (12/13 0800) Pulse Rate:  [29-123] 119 (12/13 0800) Resp:  [14-75] 23 (12/13 0800) BP: (87-133)/(45-113) 129/93 mmHg (12/13 0800) SpO2:  [85 %-100 %] 99 % (12/13 0800) Weight:  [104.1 kg (229 lb 8 oz)] 104.1 kg (229 lb 8 oz) (12/13 0500) Weight change: 0 kg (0 lb)  Intake/Output from previous day: 12/12 0701 - 12/13 0700 In: 2318.8 [I.V.:1650; Blood:668.8] Out: 400 [Blood:400] Intake/Output this shift:    Chest is clear Heart irigular rate and rhythm III/VI Abdomen soft none tennder Extremities trac edema on the left and s/p BKA on the right   Lab Results:  Recent Labs  08/08/13 0418 08/09/13 0451  WBC 18.5* 21.3*  HGB 8.9* 9.7*  HCT 27.1* 29.4*  PLT 247 225   BMET:   Recent Labs  08/08/13 0418 08/09/13 0451  NA 136 137  K 4.0 4.5  CL 92* 95*  CO2 28 25  GLUCOSE 188* 267*  BUN 44* 59*  CREATININE 6.95* 8.66*  CALCIUM 10.2 10.2   No results found for this basename: PTH,  in the last 72 hours Iron Studies: No results found for this basename: IRON, TIBC, TRANSFERRIN, FERRITIN,  in the last 72 hours  Studies/Results: No results found.  I have reviewed the patient's current medications.  Assessment/Plan: Problem #1 end-stage renal disease she status post hemodialysis yesterday. His BUN is 59 and his creatinine is 8.66 and potassium is 4.5. No uremic sign and symptoms Problem #2 history of fever, chills and hypotension. Presently has improved. Patient on antibiotics and he has cellulitis/osteomyelitis of right foot. S/p BKA Problem #3 anemia:. Probably multifactorial including iron deficiency anemia and anemia of chronic disease. His hemoglobin and hematocrit is stable Problem #4 history of diabetes Problem #5 a trial fibrillation Problem #6  history of coronary artery disease status post CABG Problem #7 status post mitral valve replacement  Problem #8 metabolic bone disease his calcium and phosphorus is with  in acceptable range. Plan: We'll do dialysis today          Basic metabolic panel and CBC in am   LOS: 4 days   [name] [name] S 08/09/2013,9:30 AM

## 2013-08-09 NOTE — Procedures (Signed)
   HEMODIALYSIS TREATMENT NOTE:  4 hour heparin-free dialysis completed via right forearm AVF.  Goal met:  Tolerated removal of 2.1 liters with no interruption in ultrafiltration.  Frequent multifocal PVCs pre-, intra-, and post-HD; pt asymptomatic.  Of note, dialyzer appeared jaundiced after blood was reinfused.  Report given to Stella, Charity fundraiser.  Abie Cheek L. Stefan Markarian, RN, CDN

## 2013-08-09 NOTE — Progress Notes (Addendum)
Paged MD via Loretha Stapler to inform MD of dialysis nurse findings of the dialyzer at the end of dialysis appeared jaundiced to see if she wanted extra lab work performed at this time. New orders were placed by MD. Will continue to monitor patient and pass this information along to next shift.

## 2013-08-09 NOTE — Progress Notes (Addendum)
[facility] PROGRESS NOTE  [name] [name]:[id number] DOB: 1951-05-24 DOA: 08/05/2013 PCP: Cassell Smiles., MD    Code Status: Full code Family Communication: Family not available currently Disposition Plan: To be determined, the patient may require short-term skilled nursing facility placement following surgery.   Consultants:  General surgeon, Dr. 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 Lovell Sheehan.  Antibiotics:  IV vancomycin 08/05/2013>>  Zosyn 08/07/2013  IV ceftaz 08/05/2013>> 08/07/2013   HPI/Subjective: The patient is has some right foot soreness. Otherwise he has no complaints. Nursing reports loss of IV access.  Objective: Filed Vitals:   08/09/13 0800  BP: 129/93  Pulse: 119  Temp: 97.7 F (36.5 C)  Resp: 23    T. current 97.7 . Afebrile overnight Pulse 119. Blood pressure 129/93. Oxygen saturation 99%.    Intake/Output Summary (Last 24 hours) at 08/09/13 0908 Last data filed at 08/08/13 1900  Gross per 24 hour  Intake 2306.25 ml  Output    400 ml  Net 1906.25 ml   Filed Weights   08/07/13 1220 08/07/13 1624 08/09/13 0500  Weight: 104.1 kg (229 lb 8 oz) 102.9 kg (226 lb 13.7 oz) 104.1 kg (229 lb 8 oz)    Exam:   General:  pleasant alert 62 year old [name] laying in bed, in no acute distress.  Cardiovascular: irregular, irregular, with mild tachycardia.  Respiratory: occasional crackles auscultated in the bases. Breathing is nonlabored.  Abdomen: positive bowel sounds, soft, nontender, nondistended.  Musculoskeletal/extremities: Right lower extremity BKA site with dressing in place, not removed. No edema on the right lower extremity.   Neurologic: He is alert and oriented x2. Cranial nerves II through XII are grossly intact.  Data Reviewed: Basic Metabolic Panel:  Recent Labs Lab 08/05/13 1348 08/06/13 0408  08/07/13 0459 08/08/13 0418 08/09/13 0451  NA 137 138 134* 136 137  K 3.6 3.9 4.3 4.0 4.5  CL 94* 95* 90* 92* 95*  CO2 24 27 22 28 25   GLUCOSE 98 47* 172* 188* 267*  BUN 22 32* 48* 44* 59*  CREATININE 5.45* 7.17* 9.23* 6.95* 8.66*  CALCIUM 9.4 10.4 10.4 10.2 10.2  PHOS  --   --  7.2* 5.1* 6.1*   Liver Function Tests:  Recent Labs Lab 08/07/13 0459 08/08/13 0418 08/09/13 0451  ALBUMIN 2.2* 2.0* 1.9*   No results found for this basename: LIPASE, AMYLASE,  in the last 168 hours No results found for this basename: AMMONIA,  in the last 168 hours CBC:  Recent Labs Lab 08/05/13 1348 08/06/13 0408 08/07/13 0459 08/07/13 1719 08/08/13 0418 08/09/13 0451  WBC 19.4* 17.1* 20.4*  --  18.5* 21.3*  NEUTROABS 17.3*  --   --   --   --   --   HGB 8.0* 8.0* 8.0* 10.0* 8.9* 9.7*  HCT 24.6* 24.3* 24.6* 30.2* 27.1* 29.4*  MCV 92.5 92.7 92.5  --  91.2 89.9  PLT 259 236 256  --  247 225   Cardiac Enzymes: No results found for this basename: CKTOTAL, CKMB, CKMBINDEX, TROPONINI,  in the last 168 hours BNP (last 3 results) No results found for this basename: PROBNP,  in the last 8760 hours CBG:  Recent Labs Lab 08/08/13 1119 08/08/13 1558 08/08/13 1719 08/08/13 2129 08/09/13 0748  GLUCAP 147* 97 101* 183* 216*    Recent Results (from the past 240 hour(s))  CULTURE, BLOOD (ROUTINE X 2)  Status: None   Collection Time    08/05/13  3:04 PM      Result Value Range Status   Specimen Description BLOOD LEFT HAND   Final   Special Requests BOTTLES DRAWN AEROBIC AND ANAEROBIC 6CC   Final   Culture NO GROWTH 4 DAYS   Final   Report Status PENDING   Incomplete  CULTURE, BLOOD (ROUTINE X 2)     Status: None   Collection Time    08/05/13  3:13 PM      Result Value Range Status   Specimen Description BLOOD LEFT ARM   Final   Special Requests BOTTLES DRAWN AEROBIC ONLY 4CC   Final   Culture NO GROWTH 4 DAYS   Final   Report Status PENDING   Incomplete  SURGICAL PCR SCREEN      Status: None   Collection Time    08/08/13  9:02 AM      Result Value Range Status   MRSA, PCR NEGATIVE  NEGATIVE Final   Staphylococcus aureus NEGATIVE  NEGATIVE Final   Comment:            The Xpert SA Assay (FDA     approved for NASAL specimens     in patients over 53 years of age),     is one component of     a comprehensive surveillance     program.  Test performance has     been validated by [employer] for patients greater     than or equal to 69 year old.     It is not intended     to diagnose infection nor to     guide or monitor treatment.     Studies: No results found.  Scheduled Meds: . amiodarone  100 mg Oral Daily  . atorvastatin  20 mg Oral q1800  . diltiazem  180 mg Oral Daily  . epoetin alfa  12,000 Units Intravenous 3 times weekly  . feeding supplement (NEPRO CARB STEADY)  237 mL Oral BID BM  . feeding supplement (PRO-STAT SUGAR FREE 64)  30 mL Oral TID WC  . folic acid  1 mg Oral Daily  . insulin aspart  0-9 Units Subcutaneous TID WC  . insulin glargine  10 Units Subcutaneous BID  . metoprolol tartrate  12.5 mg Oral TID  . multivitamin with minerals  1 tablet Oral Daily  . pantoprazole  40 mg Oral BID  . piperacillin-tazobactam (ZOSYN)  IV  2.25 g Intravenous Q8H  . sevelamer carbonate  1,600 mg Oral TID WC  . sodium chloride  10-40 mL Intracatheter Q12H  . sodium chloride  3 mL Intravenous Q12H  . traZODone  50 mg Oral QHS  . vancomycin  1,000 mg Intravenous Q T,Th,Sa-HD   Continuous Infusions:    Assessment:  Principal Problem:   Sepsis Active Problems:   Osteomyelitis of right foot   Atherosclerosis of native arteries of the extremities with gangrene   Atrial fibrillation   Rindfleisch term (current) use of anticoagulants   CAD (coronary artery disease)   ESRD (end stage renal disease) on dialysis   DM type 2 causing ESRD   Hypotension   Hypoglycemia associated with diabetes   Anemia of chronic disease     1.Osteomyelitis of the  first metatarsal with associated cellulitis and gangrene of the right foot secondary to peripheral vascular disease. Status post right BKA, postoperative day #1. 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  Previously treated with a wound VAC on the right foot per Dr. Lovell Sheehan. The patient is afebrile, but his white blood cell count is still elevated. We'll continue Zosyn and vancomycin.   Sepsis secondary to #1. Noted lactic acidosis on admission. Sepsis appears to be resolving. Continue antibiotics and supportive treatment as above.  Chronic atrial fibrillation with rapid ventricular response. His heart rate increased overnight. Diltiazem was then restarted overnight. Will continue metoprolol at reduced dosing and amiodarone.Maryclare Labrador increase dosing of amiodarone.   Chronic anticoagulation with warfarin secondary to atrial fibrillation. Coumadin is on hold because of recent surgery. IV heparin is on hold as well. We'll as Dr. Lovell Sheehan when he would recommend restarting anticoagulation postop.  CAD/ischemic cardiomyopathy. Aspirin is  on hold because of upcoming operation.  Torsemide is on hold, but he is  receiving hemodialysis. Metoprolol was restarted at a reduced dosing.  Diabetes mellitus. Episode of hypoglycemia on 12/10 was treated with an amp of D50 and gentle dextrose infusion and decrease in insulin. Now resolved. His capillary blood glucose is increasing.  Continue sliding scale NovoLog. We'll start Lantus cautiously. We'll continue to make adjustment in insulin dosing accordingly.  End-stage renal disease. Nephrology consulted. Hemodialysis per nephrology. Appreciate nephrology's assistance.  Anemia of critical illness and chronic disease. Status post 2 units of packed red blood cell transfusions prior to surgery and one unit following the operation. His hemoglobin is 9.7 today. He is also receiving Epogen per nephrology.     Plan: 1. Will increase the dose of amiodarone until  his blood pressure to support full dosing of metoprolol and diltiazem. 2. We'll asked Dr. Lovell Sheehan about restarting anticoagulation and antiplatelet therapy.   Time spent: 35 minutes.    [facility]  [facility] Pager [phone]. If 7PM-7AM, please contact night-coverage at [email or web address], password [facility] 08/09/2013, 9:08 AM  LOS: 4 days

## 2013-08-09 NOTE — Progress Notes (Signed)
1 Day Post-Op  Subjective: Currently not having right lower extremity pain.  Objective: Vital signs in last 24 hours: Temp:  [97.7 F (36.5 C)-98.9 F (37.2 C)] 98.1 F (36.7 C) (12/13 0400) Pulse Rate:  [29-123] 115 (12/13 0400) Resp:  [14-75] 14 (12/13 0500) BP: (87-133)/(45-113) 133/85 mmHg (12/13 0607) SpO2:  [85 %-100 %] 98 % (12/13 0400) Weight:  [104.1 kg (229 lb 8 oz)] 104.1 kg (229 lb 8 oz) (12/13 0500) Last BM Date: 08/07/13  Intake/Output from previous day: 12/12 0701 - 12/13 0700 In: 2318.8 [I.V.:1650; Blood:668.8] Out: 400 [Blood:400] Intake/Output this shift:    General appearance: cooperative and no distress Extremities: Right lower extremity dressing dry and intact.  Lab Results:   Recent Labs  08/08/13 0418 08/09/13 0451  WBC 18.5* 21.3*  HGB 8.9* 9.7*  HCT 27.1* 29.4*  PLT 247 225   BMET  Recent Labs  08/08/13 0418 08/09/13 0451  NA 136 137  K 4.0 4.5  CL 92* 95*  CO2 28 25  GLUCOSE 188* 267*  BUN 44* 59*  CREATININE 6.95* 8.66*  CALCIUM 10.2 10.2   PT/INR No results found for this basename: LABPROT, INR,  in the last 72 hours  Studies/Results: No results found.  Anti-infectives: Anti-infectives   Start     Dose/Rate Route Frequency Ordered Stop   08/07/13 1200  vancomycin (VANCOCIN) IVPB 1000 mg/200 mL premix     1,000 mg 200 mL/hr over 60 Minutes Intravenous Every T-Th-Sa (Hemodialysis) 08/05/13 1847     08/07/13 1200  cefTAZidime (FORTAZ) 2 g in dextrose 5 % 50 mL IVPB  Status:  Discontinued     2 g 100 mL/hr over 30 Minutes Intravenous Every T-Th-Sa (Hemodialysis) 08/05/13 1847 08/07/13 0758   08/07/13 1000  piperacillin-tazobactam (ZOSYN) IVPB 2.25 g     2.25 g 100 mL/hr over 30 Minutes Intravenous Every 8 hours 08/07/13 0812     08/05/13 2000  vancomycin (VANCOCIN) 1,500 mg in sodium chloride 0.9 % 500 mL IVPB  Status:  Discontinued     1,500 mg 250 mL/hr over 120 Minutes Intravenous  Once 08/05/13 1820 08/05/13 1839   08/05/13 1901  cefTAZidime (FORTAZ) 2 g in dextrose 5 % 50 mL IVPB     2 g 100 mL/hr over 30 Minutes Intravenous  Once 08/05/13 1820 08/05/13 2032      Assessment/Plan: s/p Procedure(s): AMPUTATION BELOW KNEE Impression: Hemoglobin stable, status post right below-the-knee amputation. Plan: Have told family that patient may benefit from transfer to rehabilitation Center for aggressive physical therapy prior to being discharged home once stable from medical standpoint.  LOS: 4 days    Aziya Arena A 08/09/2013

## 2013-08-09 NOTE — Progress Notes (Signed)
Dr. Onalee Hua was notified about pt's sustained HR in 120-130s, orders are received.

## 2013-08-10 ENCOUNTER — Encounter (HOSPITAL_COMMUNITY): Payer: Self-pay | Admitting: Internal Medicine

## 2013-08-10 HISTORY — DX: Other disorders of bilirubin metabolism: E80.6

## 2013-08-10 LAB — GLUCOSE, CAPILLARY
Glucose-Capillary: 149 mg/dL — ABNORMAL HIGH (ref 70–99)
Glucose-Capillary: 199 mg/dL — ABNORMAL HIGH (ref 70–99)
Glucose-Capillary: 217 mg/dL — ABNORMAL HIGH (ref 70–99)

## 2013-08-10 LAB — CBC
Hemoglobin: 9.5 g/dL — ABNORMAL LOW (ref 13.0–17.0)
MCH: 29.6 pg (ref 26.0–34.0)
MCV: 92.2 fL (ref 78.0–100.0)
Platelets: 252 10*3/uL (ref 150–400)
RBC: 3.21 MIL/uL — ABNORMAL LOW (ref 4.22–5.81)
RDW: 15.9 % — ABNORMAL HIGH (ref 11.5–15.5)
WBC: 16.7 10*3/uL — ABNORMAL HIGH (ref 4.0–10.5)

## 2013-08-10 LAB — CULTURE, BLOOD (ROUTINE X 2): Culture: NO GROWTH

## 2013-08-10 MED ORDER — METOPROLOL TARTRATE 25 MG PO TABS
25.0000 mg | ORAL_TABLET | Freq: Three times a day (TID) | ORAL | Status: DC
  Administered 2013-08-10 – 2013-08-13 (×9): 25 mg via ORAL
  Filled 2013-08-10 (×9): qty 1

## 2013-08-10 MED ORDER — WARFARIN - PHARMACIST DOSING INPATIENT: Status: DC

## 2013-08-10 MED ORDER — WARFARIN SODIUM 2.5 MG PO TABS
2.5000 mg | ORAL_TABLET | Freq: Once | ORAL | Status: AC
  Administered 2013-08-10: 2.5 mg via ORAL
  Filled 2013-08-10: qty 1

## 2013-08-10 NOTE — Progress Notes (Addendum)
Subjective: Interval History: Patient offers no complaint . Denies any fver chill or sweating  Objective: Vital signs in last 24 hours: Temp:  [97.5 F (36.4 C)-98.3 F (36.8 C)] 98.1 F (36.7 C) (12/14 0400) Pulse Rate:  [110-124] 116 (12/13 2100) Resp:  [14-35] 21 (12/14 0500) BP: (104-134)/(67-95) 108/84 mmHg (12/14 0500) SpO2:  [97 %-99 %] 97 % (12/13 2100) Weight:  [99.8 kg (220 lb 0.3 oz)-101.8 kg (224 lb 6.9 oz)] 99.8 kg (220 lb 0.3 oz) (12/14 0500) Weight change: -2.3 kg (-5 lb 1.1 oz)  Intake/Output from previous day: 12/13 0701 - 12/14 0700 In: 963 [P.O.:420; I.V.:43; IV Piggyback:500] Out: 2100  Intake/Output this shift:   Patient is alert in no apparent distress. Chest is clear to auscultation. He does not have any rales rhonchi or egophony. Heart irigular rate and rhythm III/VI Abdomen soft none tennder Extremities trac edema on the left and s/p BKA on the right   Lab Results:  Recent Labs  08/09/13 0451 08/10/13 0459  WBC 21.3* 16.7*  HGB 9.7* 9.5*  HCT 29.4* 29.6*  PLT 225 252   BMET:   Recent Labs  08/08/13 0418 08/09/13 0451  NA 136 137  K 4.0 4.5  CL 92* 95*  CO2 28 25  GLUCOSE 188* 267*  BUN 44* 59*  CREATININE 6.95* 8.66*  CALCIUM 10.2 10.2   No results found for this basename: PTH,  in the last 72 hours Iron Studies: No results found for this basename: IRON, TIBC, TRANSFERRIN, FERRITIN,  in the last 72 hours  Studies/Results: No results found.  I have reviewed the patient's current medications.  Assessment/Plan: Problem #1 end-stage renal disease she status post hemodialysis  yesterday. His BUN is 59 and his creatinine is 8.66 and potassium is 4.5. No uremic sign and symptoms Problem #2 history of fever, chills and hypotension. Presently has improved. Patient on antibiotics and he has cellulitis/osteomyelitis of right foot. S/p BKA. Presently patient is a febrile and his white blood cell count is improving. Problem #3 anemia:. His  hemoglobin and hematocrit is low. Presently patient is on Epogen. Problem #4 history of diabetes Problem #5 a trial fibrillation Problem #6 history of coronary artery disease status post CABG. Patient does not have any chest pain. Problem #7 status post mitral valve replacement  Problem #8 metabolic bone disease his calcium and phosphorus is with  in acceptable range. Plan: We'll do dialysis today          Basic metabolic panel and CB Cin am          We'll make arrangement for patient to get dialysis the day after tomorrow which is his regular dialysis.   LOS: 5 days   [name] [name] S 08/10/2013,8:28 AM

## 2013-08-10 NOTE — Progress Notes (Signed)
ANTICOAGULATION CONSULT NOTE -  Pharmacy Consult for Warfarin Indication: atrial fibrillation  Allergies  Allergen Reactions  . Bacitracin Other (See Comments)    unknown  . Norvasc [Amlodipine Besylate] Other (See Comments)    unknown   Patient Measurements: Height: 6\' 4"  (193 cm) Weight: 220 lb 0.3 oz (99.8 kg) IBW/kg (Calculated) : 86.8 Heparin Dosing Weight: 94Kg  Vital Signs: Temp: 98.1 F (36.7 C) (12/14 0400) Temp src: Oral (12/14 0400) BP: 114/85 mmHg (12/14 0800)  Labs:  Recent Labs  08/08/13 0418 08/09/13 0451 08/10/13 0459  HGB 8.9* 9.7* 9.5*  HCT 27.1* 29.4* 29.6*  PLT 247 225 252  CREATININE 6.95* 8.66*  --     Estimated Creatinine Clearance: 10.6 ml/min (by C-G formula based on Cr of 8.66).  Medical History: Past Medical History  Diagnosis Date  . UGI bleed 02/12/11    on anticoagulation; EGD w/snare polypectomy-multiple polypoid lesions antrum(benign), Chronic active gastritis (NEGATIVE H pylori)  . Anemia, chronic disease   . Sepsis due to enterococcus 02/11/11  . Atrial fibrillation     coumadin  . CAD (coronary artery disease)     s/p CABGx4  . Cardiomyopathy     40-45% EF 6/12  . Diabetes mellitus   . Hypertension   . Hyperlipemia   . Gout   . S/P patent foramen ovale closure   . History of nuclear stress test 11/2011    negative myoview  . End stage kidney disease     T/TH/SAT dialyis, Dr Fausto Skillern  . Gout   . History of nuclear stress test 07/04/2010    dipyridamole; mod fixed inferolateral defect; non-diagnostic for ischemia; low risk scan   . Hyperbilirubinemia 08/10/2013    Medications:  Prescriptions prior to admission  Medication Sig Dispense Refill  . allopurinol (ZYLOPRIM) 100 MG tablet Take 100 mg by mouth daily.       Marland Kitchen amiodarone (PACERONE) 200 MG tablet Take 100 mg by mouth daily.       Marland Kitchen aspirin 81 MG tablet Take 81 mg by mouth daily.        Marland Kitchen atorvastatin (LIPITOR) 20 MG tablet Take 1 tablet (20 mg total) by mouth  daily.  30 tablet  11  . B Complex-C-Zn-Folic Acid (DIALYVITE/ZINC PO) Take by mouth 3 (three) times a week. Patient has dialysis on Tuesdays, Thursdays, and Saturdays      . diltiazem (CARDIZEM CD) 180 MG 24 hr capsule Take 1 capsule (180 mg total) by mouth daily.  30 capsule  1  . folic acid (FOLVITE) 1 MG tablet Take 1 mg by mouth daily.        Marland Kitchen glimepiride (AMARYL) 4 MG tablet Take 4 mg by mouth 2 (two) times daily.        . Insulin Glargine (LANTUS SOLOSTAR) 100 UNIT/ML SOPN Inject 38 Units into the skin at bedtime.      . metoprolol (LOPRESSOR) 100 MG tablet Take 1 tablet (100 mg total) by mouth 2 (two) times daily.  60 tablet  1  . Multiple Vitamin (MULTIVITAMIN) capsule Take 1 capsule by mouth daily.      . pantoprazole (PROTONIX) 40 MG tablet Take 40 mg by mouth 2 (two) times daily.       Marland Kitchen RENVELA 800 MG tablet Take 800 mg by mouth 3 (three) times daily with meals.       . torsemide (DEMADEX) 20 MG tablet Take 20 mg by mouth 2 (two) times daily.      Marland Kitchen  traZODone (DESYREL) 50 MG tablet Take 50 mg by mouth at bedtime.      Marland Kitchen warfarin (COUMADIN) 2.5 MG tablet Take 1.25 mg by mouth daily.        Assessment: 62yo male admitted for osteomyelitis and suspected sepsis.  Pt is on Coumadin PTA for h/o afib. S/P BKA this admission. INR was 1.71 on admission.    Goal of Therapy:  INR 2-3   Plan:  Warfarin 2.5mg  PO x 1 Daily PT/INR  Mady Gemma 08/10/2013,10:08 AM

## 2013-08-10 NOTE — Progress Notes (Signed)
[facility] PROGRESS NOTE  ESLEY BROOKING ZOX:[id number] DOB: 11-29-1950 DOA: 08/05/2013 PCP: Cassell Smiles., MD    Code Status: Full code Family Communication: Family not available currently Disposition Plan: To be determined, the patient may require short-term skilled nursing facility placement following surgery.   Consultants:  General surgeon, Dr. 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 Lovell Sheehan.  Antibiotics:  IV vancomycin 08/05/2013>>  Zosyn 08/07/2013  IV ceftaz 08/05/2013>> 08/07/2013   HPI/Subjective: The patient is has some right foot soreness. He denies chest pain, shortness of breath, abdominal pain, or diarrhea.  Objective: Filed Vitals:   08/10/13 0800  BP: 114/85  Pulse:   Temp:   Resp: 13    T. current 98.1. . Afebrile overnight Pulse 110-124. Blood pressure 114/85. Oxygen saturation 99%.    Intake/Output Summary (Last 24 hours) at 08/10/13 0953 Last data filed at 08/10/13 0948  Gross per 24 hour  Intake    963 ml  Output   2100 ml  Net  -1137 ml   Filed Weights   08/09/13 0500 08/09/13 0940 08/10/13 0500  Weight: 104.1 kg (229 lb 8 oz) 101.8 kg (224 lb 6.9 oz) 99.8 kg (220 lb 0.3 oz)    Exam:   General:  pleasant alert 62 year old [name] sitting up in bed, in no acute distress.  Cardiovascular: irregular, irregular, with mild tachycardia.  Respiratory: occasional crackles auscultated in the bases. Breathing is nonlabored.  Abdomen: positive bowel sounds, soft, nontender, nondistended.  Musculoskeletal/extremities: Right lower extremity BKA site with dressing in place, not removed. The dressing is clean with no drainage coming through. No edema on the right lower extremity.   Neurologic: He is alert and oriented x2. Cranial nerves II through XII are grossly intact.  Data Reviewed: Basic Metabolic Panel:  Recent  Labs Lab 08/05/13 1348 08/06/13 0408 08/07/13 0459 08/08/13 0418 08/09/13 0451 08/10/13 0459  NA 137 138 134* 136 137  --   K 3.6 3.9 4.3 4.0 4.5  --   CL 94* 95* 90* 92* 95*  --   CO2 24 27 22 28 25   --   GLUCOSE 98 47* 172* 188* 267*  --   BUN 22 32* 48* 44* 59*  --   CREATININE 5.45* 7.17* 9.23* 6.95* 8.66*  --   CALCIUM 9.4 10.4 10.4 10.2 10.2  --   PHOS  --   --  7.2* 5.1* 6.1* 4.8*   Liver Function Tests:  Recent Labs Lab 08/07/13 0459 08/08/13 0418 08/09/13 0451 08/09/13 0500  AST  --   --   --  34  ALT  --   --   --  14  ALKPHOS  --   --   --  88  BILITOT  --   --   --  3.4*  PROT  --   --   --  6.7  ALBUMIN 2.2* 2.0* 1.9* 1.9*   No results found for this basename: LIPASE, AMYLASE,  in the last 168 hours No results found for this basename: AMMONIA,  in the last 168 hours CBC:  Recent Labs Lab 08/05/13 1348 08/06/13 0408 08/07/13 0459 08/07/13 1719 08/08/13 0418 08/09/13 0451 08/10/13 0459  WBC 19.4* 17.1* 20.4*  --  18.5* 21.3* 16.7*  NEUTROABS 17.3*  --   --   --   --   --   --   HGB 8.0* 8.0* 8.0* 10.0* 8.9* 9.7* 9.5*  HCT 24.6* 24.3* 24.6* 30.2* 27.1* 29.4* 29.6*  MCV 92.5 92.7 92.5  --  91.2 89.9 92.2  PLT 259 236 256  --  247 225 252   Cardiac Enzymes: No results found for this basename: CKTOTAL, CKMB, CKMBINDEX, TROPONINI,  in the last 168 hours BNP (last 3 results) No results found for this basename: PROBNP,  in the last 8760 hours CBG:  Recent Labs Lab 08/08/13 2129 08/09/13 0748 08/09/13 1626 08/09/13 2129 08/10/13 0744  GLUCAP 183* 216* 134* 183* 149*    Recent Results (from the past 240 hour(s))  CULTURE, BLOOD (ROUTINE X 2)     Status: None   Collection Time    08/05/13  3:04 PM      Result Value Range Status   Specimen Description BLOOD LEFT HAND   Final   Special Requests BOTTLES DRAWN AEROBIC AND ANAEROBIC 6CC   Final   Culture NO GROWTH 5 DAYS   Final   Report Status 08/10/2013 FINAL   Final  CULTURE, BLOOD (ROUTINE  X 2)     Status: None   Collection Time    08/05/13  3:13 PM      Result Value Range Status   Specimen Description BLOOD LEFT ARM   Final   Special Requests BOTTLES DRAWN AEROBIC ONLY 4CC   Final   Culture NO GROWTH 5 DAYS   Final   Report Status 08/10/2013 FINAL   Final  SURGICAL PCR SCREEN     Status: None   Collection Time    08/08/13  9:02 AM      Result Value Range Status   MRSA, PCR NEGATIVE  NEGATIVE Final   Staphylococcus aureus NEGATIVE  NEGATIVE Final   Comment:            The Xpert SA Assay (FDA     approved for NASAL specimens     in patients over 81 years of age),     is one component of     a comprehensive surveillance     program.  Test performance has     been validated by [employer] for patients greater     than or equal to 75 year old.     It is not intended     to diagnose infection nor to     guide or monitor treatment.     Studies: No results found.  Scheduled Meds: . amiodarone  100 mg Oral BID  . atorvastatin  20 mg Oral q1800  . diltiazem  180 mg Oral Daily  . epoetin alfa  12,000 Units Intravenous 3 times weekly  . feeding supplement (NEPRO CARB STEADY)  237 mL Oral BID BM  . feeding supplement (PRO-STAT SUGAR FREE 64)  30 mL Oral TID WC  . folic acid  1 mg Oral Daily  . insulin aspart  0-9 Units Subcutaneous TID WC  . insulin glargine  10 Units Subcutaneous BID  . metoprolol tartrate  12.5 mg Oral TID  . multivitamin with minerals  1 tablet Oral Daily  . pantoprazole  40 mg Oral BID  . piperacillin-tazobactam (ZOSYN)  IV  2.25 g Intravenous Q8H  . sevelamer carbonate  1,600 mg Oral TID WC  . sodium chloride  10-40 mL Intracatheter Q12H  . sodium chloride  3 mL Intravenous Q12H  . traZODone  50 mg Oral QHS  . vancomycin  1,000 mg Intravenous Q T,Th,Sa-HD   Continuous Infusions:    Assessment:  Principal  Problem:   Sepsis Active Problems:   Osteomyelitis of right foot   Atherosclerosis of native arteries of the extremities with  gangrene   Atrial fibrillation   Hopke term (current) use of anticoagulants   CAD (coronary artery disease)   ESRD (end stage renal disease) on dialysis   DM type 2 causing ESRD   Hypotension   Hypoglycemia associated with diabetes   Anemia of chronic disease   Hyperbilirubinemia     1.Osteomyelitis of the first metatarsal with associated cellulitis and gangrene of the right foot secondary to peripheral vascular disease. Status post right BKA, postoperative day #2. 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 Previously treated with a wound VAC on the right foot per Dr. Lovell Sheehan. The patient is afebrile, but his white blood cell count is improving. We'll continue Zosyn and vancomycin. PT recommendations per Dr. Lovell Sheehan,  Sepsis secondary to #1. Noted lactic acidosis on admission. Sepsis appears to be resolving. Continue antibiotics and supportive treatment as above.  Chronic atrial fibrillation with rapid ventricular response. His heart rate is in the 110-120 range. Amiodarone was increased to twice a day on 08/09/2013. Diltiazem was restarted. Will increase metoprolol to 25 mg 3 times a day as time not sure if his blood pressure will tolerate his home dose of 100 mg twice a day yet.  Chronic anticoagulation with warfarin secondary to atrial fibrillation. Coumadin was on hold, but will be restarted today without bridging.  CAD/ischemic cardiomyopathy. Aspirin is  on hold for now.  Torsemide is on hold, but he is  receiving hemodialysis. Metoprolol was restarted at a reduced dosing.  Diabetes mellitus. Episode of hypoglycemia on 12/10 was treated with an amp of D50 and gentle dextrose infusion and decrease in insulin. Now resolved. His capillary blood glucose is waxing and waning.  Continue sliding scale NovoLog. Lantus restarted cautiously. We'll continue to make adjustments in insulin dosing accordingly.  End-stage renal disease. Nephrology consulted. Hemodialysis per nephrology.  Appreciate nephrology's assistance.  Anemia of critical illness and chronic disease. Status post 2 units of packed red blood cell transfusions prior to surgery and one unit following the operation. His hemoglobin is 9.5 today. He is also receiving Epogen per nephrology.   Hyperbilirubinemia. Per chart review, this appears to be chronic. We'll continue to follow.  Plan: 1. Will increase metoprolol to 25 mg 3 times a day. 2. Will restart Coumadin per pharmacy without heparin bridge. We'll discuss further with Dr. Lovell Sheehan. 3. Physical therapy recommendations per Dr. Lovell Sheehan. 4. We'll monitor the patient in the step down unit over the weekend.   Time spent: 30 minutes.    [facility]  [facility] Pager [phone]. If 7PM-7AM, please contact night-coverage at [email or web address], password [email or web address] 08/10/2013, 9:53 AM  LOS: 5 days

## 2013-08-10 NOTE — Progress Notes (Signed)
2 Days Post-Op  Subjective: Pain under control.  Objective: Vital signs in last 24 hours: Temp:  [97.5 F (36.4 C)-98.1 F (36.7 C)] 98.1 F (36.7 C) (12/14 0400) Pulse Rate:  [110-124] 116 (12/13 2100) Resp:  [13-35] 13 (12/14 0800) BP: (104-132)/(67-95) 114/85 mmHg (12/14 0800) SpO2:  [97 %-99 %] 97 % (12/13 2100) Weight:  [99.8 kg (220 lb 0.3 oz)] 99.8 kg (220 lb 0.3 oz) (12/14 0500) Last BM Date: 08/07/13  Intake/Output from previous day: 12/13 0701 - 12/14 0700 In: 963 [P.O.:420; I.V.:43; IV Piggyback:500] Out: 2100  Intake/Output this shift:    General appearance: alert, cooperative and no distress Extremities: Right lower extremity dressing dry, intact.  Lab Results:   Recent Labs  08/09/13 0451 08/10/13 0459  WBC 21.3* 16.7*  HGB 9.7* 9.5*  HCT 29.4* 29.6*  PLT 225 252   BMET  Recent Labs  08/08/13 0418 08/09/13 0451  NA 136 137  K 4.0 4.5  CL 92* 95*  CO2 28 25  GLUCOSE 188* 267*  BUN 44* 59*  CREATININE 6.95* 8.66*  CALCIUM 10.2 10.2   PT/INR No results found for this basename: LABPROT, INR,  in the last 72 hours  Studies/Results: No results found.  Anti-infectives: Anti-infectives   Start     Dose/Rate Route Frequency Ordered Stop   08/07/13 1200  vancomycin (VANCOCIN) IVPB 1000 mg/200 mL premix     1,000 mg 200 mL/hr over 60 Minutes Intravenous Every T-Th-Sa (Hemodialysis) 08/05/13 1847     08/07/13 1200  cefTAZidime (FORTAZ) 2 g in dextrose 5 % 50 mL IVPB  Status:  Discontinued     2 g 100 mL/hr over 30 Minutes Intravenous Every T-Th-Sa (Hemodialysis) 08/05/13 1847 08/07/13 0758   08/07/13 1000  piperacillin-tazobactam (ZOSYN) IVPB 2.25 g     2.25 g 100 mL/hr over 30 Minutes Intravenous Every 8 hours 08/07/13 0812     08/05/13 2000  vancomycin (VANCOCIN) 1,500 mg in sodium chloride 0.9 % 500 mL IVPB  Status:  Discontinued     1,500 mg 250 mL/hr over 120 Minutes Intravenous  Once 08/05/13 1820 08/05/13 1839   08/05/13 1901   cefTAZidime (FORTAZ) 2 g in dextrose 5 % 50 mL IVPB     2 g 100 mL/hr over 30 Minutes Intravenous  Once 08/05/13 1820 08/05/13 2032      Assessment/Plan: s/p Procedure(s): AMPUTATION BELOW KNEE Imp:  Stable from surgery standpoint.  PT consulted.  Would benefit from rehab placement.  May restart anticoagulation.  LOS: 5 days    Leah Skora A 08/10/2013

## 2013-08-10 NOTE — Progress Notes (Signed)
ANTIBIOTIC CONSULT NOTE  Pharmacy Consult for Vancomycin and Zosyn Indication: Osteomyelitis Right Foot  Allergies  Allergen Reactions  . Bacitracin Other (See Comments)    unknown  . Norvasc [Amlodipine Besylate] Other (See Comments)    unknown    Patient Measurements: Height: 6\' 4"  (193 cm) Weight: 220 lb 0.3 oz (99.8 kg) IBW/kg (Calculated) : 86.8  Vital Signs: Temp: 98.1 F (36.7 C) (12/14 0400) Temp src: Oral (12/14 0400) BP: 114/85 mmHg (12/14 0800) Intake/Output from previous day: 12/13 0701 - 12/14 0700 In: 963 [P.O.:420; I.V.:43; IV Piggyback:500] Out: 2100  Intake/Output from this shift:    Labs:  Recent Labs  08/08/13 0418 08/09/13 0451 08/10/13 0459  WBC 18.5* 21.3* 16.7*  HGB 8.9* 9.7* 9.5*  PLT 247 225 252  CREATININE 6.95* 8.66*  --    Estimated Creatinine Clearance: 10.6 ml/min (by C-G formula based on Cr of 8.66). No results found for this basename: VANCOTROUGH, Leodis Binet, VANCORANDOM, GENTTROUGH, GENTPEAK, GENTRANDOM, TOBRATROUGH, TOBRAPEAK, TOBRARND, AMIKACINPEAK, AMIKACINTROU, AMIKACIN,  in the last 72 hours   Microbiology: Recent Results (from the past 720 hour(s))  CULTURE, BLOOD (ROUTINE X 2)     Status: None   Collection Time    08/05/13  3:04 PM      Result Value Range Status   Specimen Description BLOOD LEFT HAND   Final   Special Requests BOTTLES DRAWN AEROBIC AND ANAEROBIC 6CC   Final   Culture NO GROWTH 5 DAYS   Final   Report Status 08/10/2013 FINAL   Final  CULTURE, BLOOD (ROUTINE X 2)     Status: None   Collection Time    08/05/13  3:13 PM      Result Value Range Status   Specimen Description BLOOD LEFT ARM   Final   Special Requests BOTTLES DRAWN AEROBIC ONLY 4CC   Final   Culture NO GROWTH 5 DAYS   Final   Report Status 08/10/2013 FINAL   Final  SURGICAL PCR SCREEN     Status: None   Collection Time    08/08/13  9:02 AM      Result Value Range Status   MRSA, PCR NEGATIVE  NEGATIVE Final   Staphylococcus aureus  NEGATIVE  NEGATIVE Final   Comment:            The Xpert SA Assay (FDA     approved for NASAL specimens     in patients over 72 years of age),     is one component of     a comprehensive surveillance     program.  Test performance has     been validated by [employer] for patients greater     than or equal to 26 year old.     It is not intended     to diagnose infection nor to     guide or monitor treatment.    Medical History: Past Medical History  Diagnosis Date  . UGI bleed 02/12/11    on anticoagulation; EGD w/snare polypectomy-multiple polypoid lesions antrum(benign), Chronic active gastritis (NEGATIVE H pylori)  . Anemia, chronic disease   . Sepsis due to enterococcus 02/11/11  . Atrial fibrillation     coumadin  . CAD (coronary artery disease)     s/p CABGx4  . Cardiomyopathy     40-45% EF 6/12  . Diabetes mellitus   . Hypertension   . Hyperlipemia   . Gout   . S/P patent foramen ovale closure   .  History of nuclear stress test 11/2011    negative myoview  . End stage kidney disease     T/TH/SAT dialyis, Dr Fausto Skillern  . Gout   . History of nuclear stress test 07/04/2010    dipyridamole; mod fixed inferolateral defect; non-diagnostic for ischemia; low risk scan     Medications:  Scheduled:  . amiodarone  100 mg Oral BID  . atorvastatin  20 mg Oral q1800  . diltiazem  180 mg Oral Daily  . epoetin alfa  12,000 Units Intravenous 3 times weekly  . feeding supplement (NEPRO CARB STEADY)  237 mL Oral BID BM  . feeding supplement (PRO-STAT SUGAR FREE 64)  30 mL Oral TID WC  . folic acid  1 mg Oral Daily  . insulin aspart  0-9 Units Subcutaneous TID WC  . insulin glargine  10 Units Subcutaneous BID  . metoprolol tartrate  12.5 mg Oral TID  . multivitamin with minerals  1 tablet Oral Daily  . pantoprazole  40 mg Oral BID  . piperacillin-tazobactam (ZOSYN)  IV  2.25 g Intravenous Q8H  . sevelamer carbonate  1,600 mg Oral TID WC  . sodium chloride  10-40 mL  Intracatheter Q12H  . sodium chloride  3 mL Intravenous Q12H  . traZODone  50 mg Oral QHS  . vancomycin  1,000 mg Intravenous Q T,Th,Sa-HD   Assessment: 62 yo obese M with ESRD.  S/P R BKA this admission due to gangrene/osteomyelitis.   WBC trending down, Micro (-).  Goal of Therapy:  Pre-Hemodialysis Vancomycin level goal range =15-25 mcg/ml Eradicate infection.  Plan:  Continue Vancomycin 1000mg  IV every HD. Zosyn 2.25gm IV q8h Vancomycin level as clinically indicated.  Lamonte Richer R 08/10/2013,9:52 AM

## 2013-08-10 NOTE — Anesthesia Postprocedure Evaluation (Signed)
  Anesthesia Post-op Note  Patient: [name] [name]  Procedure(s) Performed: Procedure(s): AMPUTATION BELOW KNEE (Right)  Patient Location: ICU 9  Anesthesia Type:Spinal  Level of Consciousness: awake, alert , oriented and patient cooperative  Airway and Oxygen Therapy: Patient Spontanous Breathing  Post-op Pain: mild  Post-op Assessment: Post-op Vital signs reviewed, Patient's Cardiovascular Status Stable, Respiratory Function Stable, Patent Airway, No signs of Nausea or vomiting, Adequate PO intake and Pain level controlled  Post-op Vital Signs: Reviewed and stable  Complications: No apparent anesthesia complications

## 2013-08-11 ENCOUNTER — Encounter (HOSPITAL_COMMUNITY): Payer: Self-pay | Admitting: General Surgery

## 2013-08-11 LAB — CBC
HCT: 30.2 % — ABNORMAL LOW (ref 39.0–52.0)
MCH: 29.6 pg (ref 26.0–34.0)
MCHC: 32.1 g/dL (ref 30.0–36.0)
MCV: 92.1 fL (ref 78.0–100.0)
Platelets: 266 10*3/uL (ref 150–400)
RDW: 16.1 % — ABNORMAL HIGH (ref 11.5–15.5)

## 2013-08-11 LAB — PHOSPHORUS: Phosphorus: 4.6 mg/dL (ref 2.3–4.6)

## 2013-08-11 LAB — GLUCOSE, CAPILLARY
Glucose-Capillary: 173 mg/dL — ABNORMAL HIGH (ref 70–99)
Glucose-Capillary: 153 mg/dL — ABNORMAL HIGH (ref 70–99)

## 2013-08-11 LAB — TYPE AND SCREEN
ABO/RH(D): O POS
Antibody Screen: NEGATIVE
Unit division: 0
Unit division: 0
Unit division: 0

## 2013-08-11 LAB — BASIC METABOLIC PANEL
BUN: 53 mg/dL — ABNORMAL HIGH (ref 6–23)
CO2: 28 mEq/L (ref 19–32)
Calcium: 10 mg/dL (ref 8.4–10.5)
Chloride: 94 mEq/L — ABNORMAL LOW (ref 96–112)
Creatinine, Ser: 7.91 mg/dL — ABNORMAL HIGH (ref 0.50–1.35)
GFR calc Af Amer: 7 mL/min — ABNORMAL LOW (ref >90)
GFR calc non Af Amer: 6 mL/min — ABNORMAL LOW (ref >90)

## 2013-08-11 LAB — PROTIME-INR: Prothrombin Time: 34.4 seconds — ABNORMAL HIGH (ref 11.6–15.2)

## 2013-08-11 NOTE — Progress Notes (Signed)
ANTIBIOTIC CONSULT NOTE  Pharmacy Consult for Vancomycin and Zosyn Indication: Osteomyelitis Right Foot  Allergies  Allergen Reactions  . Bacitracin Other (See Comments)    unknown  . Norvasc [Amlodipine Besylate] Other (See Comments)    unknown    Patient Measurements: Height: 6\' 4"  (193 cm) Weight: 224 lb 6.9 oz (101.8 kg) IBW/kg (Calculated) : 86.8  Vital Signs: Temp: 97.6 F (36.4 C) (12/15 0400) Temp src: Oral (12/15 0400) BP: 125/83 mmHg (12/15 0600) Intake/Output from previous day: 12/14 0701 - 12/15 0700 In: 723 [P.O.:540; I.V.:33; IV Piggyback:150] Out: -  Intake/Output from this shift:    Labs:  Recent Labs  08/09/13 0451 08/10/13 0459 08/11/13 0422  WBC 21.3* 16.7* 15.7*  HGB 9.7* 9.5* 9.7*  PLT 225 252 266  CREATININE 8.66*  --  7.91*   Estimated Creatinine Clearance: 11.6 ml/min (by C-G formula based on Cr of 7.91). No results found for this basename: VANCOTROUGH, Leodis Binet, VANCORANDOM, GENTTROUGH, GENTPEAK, GENTRANDOM, TOBRATROUGH, TOBRAPEAK, TOBRARND, AMIKACINPEAK, AMIKACINTROU, AMIKACIN,  in the last 72 hours   Microbiology: Recent Results (from the past 720 hour(s))  CULTURE, BLOOD (ROUTINE X 2)     Status: None   Collection Time    08/05/13  3:04 PM      Result Value Range Status   Specimen Description BLOOD LEFT HAND   Final   Special Requests BOTTLES DRAWN AEROBIC AND ANAEROBIC 6CC   Final   Culture NO GROWTH 5 DAYS   Final   Report Status 08/10/2013 FINAL   Final  CULTURE, BLOOD (ROUTINE X 2)     Status: None   Collection Time    08/05/13  3:13 PM      Result Value Range Status   Specimen Description BLOOD LEFT ARM   Final   Special Requests BOTTLES DRAWN AEROBIC ONLY 4CC   Final   Culture NO GROWTH 5 DAYS   Final   Report Status 08/10/2013 FINAL   Final  SURGICAL PCR SCREEN     Status: None   Collection Time    08/08/13  9:02 AM      Result Value Range Status   MRSA, PCR NEGATIVE  NEGATIVE Final   Staphylococcus aureus  NEGATIVE  NEGATIVE Final   Comment:            The Xpert SA Assay (FDA     approved for NASAL specimens     in patients over 31 years of age),     is one component of     a comprehensive surveillance     program.  Test performance has     been validated by [employer] for patients greater     than or equal to 81 year old.     It is not intended     to diagnose infection nor to     guide or monitor treatment.    Medical History: Past Medical History  Diagnosis Date  . UGI bleed 02/12/11    on anticoagulation; EGD w/snare polypectomy-multiple polypoid lesions antrum(benign), Chronic active gastritis (NEGATIVE H pylori)  . Anemia, chronic disease   . Sepsis due to enterococcus 02/11/11  . Atrial fibrillation     coumadin  . CAD (coronary artery disease)     s/p CABGx4  . Cardiomyopathy     40-45% EF 6/12  . Diabetes mellitus   . Hypertension   . Hyperlipemia   . Gout   . S/P patent foramen ovale closure   .  History of nuclear stress test 11/2011    negative myoview  . End stage kidney disease     T/TH/SAT dialyis, Dr Fausto Skillern  . Gout   . History of nuclear stress test 07/04/2010    dipyridamole; mod fixed inferolateral defect; non-diagnostic for ischemia; low risk scan   . Hyperbilirubinemia 08/10/2013    Medications:  Scheduled:  . amiodarone  100 mg Oral BID  . atorvastatin  20 mg Oral q1800  . diltiazem  180 mg Oral Daily  . epoetin alfa  12,000 Units Intravenous 3 times weekly  . feeding supplement (NEPRO CARB STEADY)  237 mL Oral BID BM  . feeding supplement (PRO-STAT SUGAR FREE 64)  30 mL Oral TID WC  . folic acid  1 mg Oral Daily  . insulin aspart  0-9 Units Subcutaneous TID WC  . insulin glargine  10 Units Subcutaneous BID  . metoprolol tartrate  25 mg Oral TID  . multivitamin with minerals  1 tablet Oral Daily  . pantoprazole  40 mg Oral BID  . piperacillin-tazobactam (ZOSYN)  IV  2.25 g Intravenous Q8H  . sevelamer carbonate  1,600 mg Oral TID WC  .  sodium chloride  10-40 mL Intracatheter Q12H  . sodium chloride  3 mL Intravenous Q12H  . traZODone  50 mg Oral QHS  . vancomycin  1,000 mg Intravenous Q T,Th,Sa-HD  . Warfarin - Pharmacist Dosing Inpatient   Does not apply Q24H   Assessment: 62 yo obese M with ESRD.  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   Patient is afebrile, but WBC is trending down. He remains on broad spectrum antibiotics.   Goal of Therapy:  Pre-Hemodialysis Vancomycin level goal range =15-25 mcg/ml Eradicate infection.  Plan:  Continue Vancomycin 1000mg  IV every HD. Zosyn 2.25gm IV q8h Check Vancomycin level prior to HD on Wed if plan for IV antibiotics to continue Duration of therapy per MD  Elson Clan 08/11/2013,8:12 AM

## 2013-08-11 NOTE — Clinical Social Work Placement (Signed)
    Clinical Social Work Department CLINICAL SOCIAL WORK PLACEMENT NOTE 08/11/2013  Patient:  [name] [name], [name] [name]  Account Number:  [id number] Admit date:  08/05/2013  Clinical Social Worker:  Santa Genera, CLINICAL SOCIAL WORKER  Date/time:  08/11/2013 12:30 PM  Clinical Social Work is seeking post-discharge placement for this patient at the following level of care:   SKILLED NURSING   (*CSW will update this form in Epic as items are completed)   08/11/2013  Patient/family provided with [facility] Department of Clinical Social Work's list of facilities offering this level of care within the geographic area requested by the patient (or if unable, by the patient's family).  08/11/2013  Patient/family informed of their freedom to choose among providers that offer the needed level of care, that participate in Medicare, Medicaid or managed care program needed by the patient, have an available bed and are willing to accept the patient.  08/11/2013  Patient/family informed of [facility]' ownership interest in [facility], as well as of the fact that they are under no obligation to receive care at this facility.  PASARR submitted to EDS on 08/11/2013 PASARR number received from EDS on 08/11/2013  FL2 transmitted to all facilities in geographic area requested by pt/family on  08/11/2013 FL2 transmitted to all facilities within larger geographic area on   Patient informed that his/her managed care company has contracts with or will negotiate with  certain facilities, including the following:     Patient/family informed of bed offers received:   Patient chooses bed at  Physician recommends and patient chooses bed at    Patient to be transferred to  on   Patient to be transferred to facility by   The following physician request were entered in Epic:   Additional Comments:  Patient wants SNF rehab as back up plan for inpatient rehab.

## 2013-08-11 NOTE — Progress Notes (Signed)
Pt transferred to room 321 per Dr. Sherrie Mustache.  Report given to Earnstine Regal, RN. Pt's left femoral line patent and WNL. Flushed with 10 cc prior to transfer. Pt alert and oriented at time of transfer. Wife at bedside. Pt left floor via bed in stable condition accompanied by NT.

## 2013-08-11 NOTE — Clinical Social Work Psychosocial (Signed)
    Clinical Social Work Department BRIEF PSYCHOSOCIAL ASSESSMENT 08/11/2013  Patient:  [name] [name], [name] [name]     Account Number:  [id number]     Admit date:  08/05/2013  Clinical Social Worker:  Santa Genera, CLINICAL SOCIAL WORKER  Date/Time:  08/11/2013 12:00 N  Referred by:  CSW  Date Referred:  08/11/2013 Referred for  SNF Placement   Other Referral:   Interview type:  Patient Other interview type:   Also spoke w wife in room w patient consent    PSYCHOSOCIAL DATA Living Status:  WIFE Admitted from facility:   Level of care:   Primary support name:  Dorna Leitz Primary support relationship to patient:  SPOUSE Degree of support available:   Spouse supportive but unable to meet patient's physical care needs at home    CURRENT CONCERNS Current Concerns  Post-Acute Placement   Other Concerns:    SOCIAL WORK ASSESSMENT / PLAN CSW met w patient at bedside, wife in room and participating w patient consent.  Patient alert and oriented x4.  Patient has been retired 5 years from [employer], worked as a Administrator, sports.  Lives w wife and has been fully independent until recently, does use walker to walk. Has just had right BKA, needs rehab to be able to regain ability to walk and take care of own ADLs.  Wife was discharged from hospital 3 days ago post surgery, she is unable to meet patient's physical care needs at home.  Both patient and wife agree that patient needs rehab before returning home.    PT has recommended inpatient rehab, if patient is not accepted for inpatient rehab, wants to go to SNF.   Advised about copays for patient under their insurance, no concerns expressed.  Wanted placement at [facility] in [place] if SNF is required.   Assessment/plan status:  Psychosocial Support/Ongoing Assessment of Needs Other assessment/ plan:   Information/referral to community resources:   [employer] list    PATIENT'S/FAMILY'S RESPONSE TO PLAN OF CARE: Wants [facility] if possible, agreeable to SNF  bed search as back up for CIR.  Santa Genera, LCSW Clinical Social Worker [phone])

## 2013-08-11 NOTE — Progress Notes (Signed)
ANTICOAGULATION CONSULT NOTE -  Pharmacy Consult for Warfarin Indication: atrial fibrillation  Allergies  Allergen Reactions  . Bacitracin Other (See Comments)    unknown  . Norvasc [Amlodipine Besylate] Other (See Comments)    unknown   Patient Measurements: Height: 6\' 4"  (193 cm) Weight: 224 lb 6.9 oz (101.8 kg) IBW/kg (Calculated) : 86.8  Vital Signs: Temp: 97.6 F (36.4 C) (12/15 0400) Temp src: Oral (12/15 0400) BP: 125/83 mmHg (12/15 0600)  Labs:  Recent Labs  08/09/13 0451 08/10/13 0459 08/11/13 0422  HGB 9.7* 9.5* 9.7*  HCT 29.4* 29.6* 30.2*  PLT 225 252 266  LABPROT  --   --  34.4*  INR  --   --  3.58*  CREATININE 8.66*  --  7.91*    Estimated Creatinine Clearance: 11.6 ml/min (by C-G formula based on Cr of 7.91).  Medical History: Past Medical History  Diagnosis Date  . UGI bleed 02/12/11    on anticoagulation; EGD w/snare polypectomy-multiple polypoid lesions antrum(benign), Chronic active gastritis (NEGATIVE H pylori)  . Anemia, chronic disease   . Sepsis due to enterococcus 02/11/11  . Atrial fibrillation     coumadin  . CAD (coronary artery disease)     s/p CABGx4  . Cardiomyopathy     40-45% EF 6/12  . Diabetes mellitus   . Hypertension   . Hyperlipemia   . Gout   . S/P patent foramen ovale closure   . History of nuclear stress test 11/2011    negative myoview  . End stage kidney disease     T/TH/SAT dialyis, Dr Fausto Skillern  . Gout   . History of nuclear stress test 07/04/2010    dipyridamole; mod fixed inferolateral defect; non-diagnostic for ischemia; low risk scan   . Hyperbilirubinemia 08/10/2013    Medications:  Prescriptions prior to admission  Medication Sig Dispense Refill  . allopurinol (ZYLOPRIM) 100 MG tablet Take 100 mg by mouth daily.       Marland Kitchen amiodarone (PACERONE) 200 MG tablet Take 100 mg by mouth daily.       Marland Kitchen aspirin 81 MG tablet Take 81 mg by mouth daily.        Marland Kitchen atorvastatin (LIPITOR) 20 MG tablet Take 1 tablet (20  mg total) by mouth daily.  30 tablet  11  . B Complex-C-Zn-Folic Acid (DIALYVITE/ZINC PO) Take by mouth 3 (three) times a week. Patient has dialysis on Tuesdays, Thursdays, and Saturdays      . diltiazem (CARDIZEM CD) 180 MG 24 hr capsule Take 1 capsule (180 mg total) by mouth daily.  30 capsule  1  . folic acid (FOLVITE) 1 MG tablet Take 1 mg by mouth daily.        Marland Kitchen glimepiride (AMARYL) 4 MG tablet Take 4 mg by mouth 2 (two) times daily.        . Insulin Glargine (LANTUS SOLOSTAR) 100 UNIT/ML SOPN Inject 38 Units into the skin at bedtime.      . metoprolol (LOPRESSOR) 100 MG tablet Take 1 tablet (100 mg total) by mouth 2 (two) times daily.  60 tablet  1  . Multiple Vitamin (MULTIVITAMIN) capsule Take 1 capsule by mouth daily.      . pantoprazole (PROTONIX) 40 MG tablet Take 40 mg by mouth 2 (two) times daily.       Marland Kitchen RENVELA 800 MG tablet Take 800 mg by mouth 3 (three) times daily with meals.       . torsemide (DEMADEX) 20 MG tablet Take  20 mg by mouth 2 (two) times daily.      . traZODone (DESYREL) 50 MG tablet Take 50 mg by mouth at bedtime.      Marland Kitchen warfarin (COUMADIN) 2.5 MG tablet Take 1.25 mg by mouth daily.        Assessment: 62yo male admitted for osteomyelitis and suspected sepsis.  Pt is on Coumadin PTA for h/o afib. S/P BKA this admission. INR was 1.71 on admission.   Coumadin was restarted last night & INR is elevated >3 today.  No bleeding noted.   Goal of Therapy:  INR 2-3   Plan:  Hold warfarin today Daily PT/INR  Elson Clan 08/11/2013,8:10 AM

## 2013-08-11 NOTE — Progress Notes (Signed)
Rehab Admissions Coordinator Note:  Patient was screened by Trish Mage for appropriateness for an Inpatient Acute Rehab Consult.  Noted PT recommending inpatient rehab consult.  At this time, we are recommending Inpatient Rehab consult.  I will follow progress for now.  Had limited PT evaluation done today.  Call me for questions.    Trish Mage 08/11/2013, 4:51 PM  I can be reached at [phone].

## 2013-08-11 NOTE — Evaluation (Signed)
Physical Therapy Evaluation Patient Details Name: [name] [name] MRN: [id number] DOB: Mar 22, 1951 Today's Date: 08/11/2013 Time: 4098-1191 PT Time Calculation (min): 23 min  PT Assessment / Plan / Recommendation History of Present Illness    Patient is a 62 year old black male with peripheral vascular disease, diabetes mellitus, end-stage renal disease, status post a right great toe amputation on 07/07/13 who has had progressive gangrenous changes of the right foot. He presented to the hospital with a blood pressure of 90/72 and noticed immediately following dialysis. They did administer 400 cc of saline and brought him to the hospital for evaluation. In the hospital he was found to have a temperature of 100.7, a lactic acid of 2.8, WBC count of 19.4, as well as a foot x-ray that shows continued osteomyelitis of the first metatarsal.  Had Rt BKA on 08/08/13 and currently wound is healing well and pt does not have any complaints.    Clinical Impression  Mr. Bussey is a pleasant 62 year old male referred to PT for mobility S/P Rt BKA.  At this time is limited in his overall mobility due to Lt femoral artery line.  At this time demonstrates significant weakness to surgical and non surgical limb.  He was fully independent with ambulating until 3 weeks ago with a great toe amputation.  His wife had back surgery on on 08/04/13.  At this time feel pt would benefit from skilled PT intervention to improve independence and decrease burden of care.     PT Assessment  Patient needs continued PT services    Follow Up Recommendations  CIR    Does the patient have the potential to tolerate intense rehabilitation      Barriers to Discharge        Equipment Recommendations  None recommended by PT    Recommendations for Other Services Rehab consult   Frequency Min 6X/week    Precautions / Restrictions Restrictions Weight Bearing Restrictions: No   Pertinent Vitals/Pain Denies pain      Mobility  Bed  Mobility Bed Mobility: Supine to Sit;Sit to Supine;Rolling Right;Rolling Left Rolling Right: 6: Modified independent (Device/Increase time);With rail Rolling Left: 6: Modified independent (Device/Increase time);With rail Supine to Sit: 4: Min assist Sit to Supine: 4: Min assist Details for Bed Mobility Assistance: Limited due to LLE femoral line     Exercises     PT Diagnosis: Generalized weakness;Difficulty walking  PT Problem List: Decreased strength;Decreased balance;Decreased activity tolerance PT Treatment Interventions: DME instruction;Gait training;Stair training;Functional mobility training;Therapeutic activities;Therapeutic exercise;Balance training;Neuromuscular re-education;Patient/family education     PT Goals(Current goals can be found in the care plan section) Acute Rehab PT Goals PT Goal Formulation: With patient Time For Goal Achievement: 08/18/13 Potential to Achieve Goals: Good  Visit Information  Last PT Received On: 08/11/13       Prior Functioning  Home Living Family/patient expects to be discharged to:: Private residence Living Arrangements: Spouse/significant other Available Help at Discharge: Family;Available 24 hours/day Type of Home: House Home Access: Stairs to enter [employer] of Steps: 3 Entrance Stairs-Rails: None Home Equipment: Walker - 2 wheels Additional Comments: Wife had back surgery on 08/04/13 Prior Function Level of Independence: Independent with assistive device(s) Comments: RW for ambulation after great toe amputation  Communication Communication: No difficulties Dominant Hand: Right    Cognition       Extremity/Trunk Assessment Lower Extremity Assessment Lower Extremity Assessment: Generalized weakness;RLE deficits/detail;LLE deficits/detail RLE Deficits / Details: 3- to 3+/5 for hip and knee strength LLE Deficits /  Details: 3+ to 4/5 for hip and knee strength    Balance    End of Session PT - End of  Session Activity Tolerance: Patient tolerated treatment well Patient left: in chair;with call bell/phone within reach Nurse Communication: Mobility status  GP     Edrick Whitehorn, MPT, ATC 08/11/2013, 9:26 AM

## 2013-08-11 NOTE — Progress Notes (Signed)
[facility] PROGRESS NOTE  [name] [name] ZOX:[id number] DOB: Feb 04, 1951 DOA: 08/05/2013 PCP: Cassell Smiles., MD    Code Status: Full code Family Communication: Family not available currently Disposition Plan: Plan to discharge to skilled nursing facility for rehabilitation when clinically appropriate.   Consultants:  General surgeon, Dr. 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 Lovell Sheehan.  Antibiotics:  IV vancomycin 08/05/2013>>  Zosyn 08/07/2013>>  IV ceftaz 08/05/2013>> 08/07/2013   HPI/Subjective: The patient is has some right foot soreness. He denies chest pain, shortness of breath, abdominal pain, or diarrhea.  Objective: Filed Vitals:   08/11/13 0814  BP:   Pulse:   Temp: 97.5 F (36.4 C)  Resp:     T. current 97.5. Marland Kitchen Afebrile overnight Pulse 100-110 Blood pressure 125/83. Oxygen saturation 99%.    Intake/Output Summary (Last 24 hours) at 08/11/13 1010 Last data filed at 08/11/13 0900  Gross per 24 hour  Intake    793 ml  Output      0 ml  Net    793 ml   Filed Weights   08/09/13 0940 08/10/13 0500 08/11/13 0500  Weight: 101.8 kg (224 lb 6.9 oz) 99.8 kg (220 lb 0.3 oz) 101.8 kg (224 lb 6.9 oz)    Exam:   General:  pleasant alert 62 year old [name] sitting up in bed, in no acute distress.  Cardiovascular: irregular, irregular, with mild tachycardia.  Respiratory: occasional crackles auscultated in the bases. Breathing is nonlabored.  Abdomen: positive bowel sounds, soft, nontender, nondistended.  Musculoskeletal/extremities: Right lower extremity BKA site with staples and sutures in place. No surrounding edema or erythema. Mildly tender to palpation.   Neurologic: He is alert and oriented x2. Cranial nerves II through XII are grossly intact.  Data Reviewed: Basic Metabolic Panel:  Recent Labs Lab 08/06/13 0408 08/07/13 0459  08/08/13 0418 08/09/13 0451 08/10/13 0459 08/11/13 0422  NA 138 134* 136 137  --  136  K 3.9 4.3 4.0 4.5  --  3.8  CL 95* 90* 92* 95*  --  94*  CO2 27 22 28 25   --  28  GLUCOSE 47* 172* 188* 267*  --  174*  BUN 32* 48* 44* 59*  --  53*  CREATININE 7.17* 9.23* 6.95* 8.66*  --  7.91*  CALCIUM 10.4 10.4 10.2 10.2  --  10.0  PHOS  --  7.2* 5.1* 6.1* 4.8* 4.6   Liver Function Tests:  Recent Labs Lab 08/07/13 0459 08/08/13 0418 08/09/13 0451 08/09/13 0500  AST  --   --   --  34  ALT  --   --   --  14  ALKPHOS  --   --   --  88  BILITOT  --   --   --  3.4*  PROT  --   --   --  6.7  ALBUMIN 2.2* 2.0* 1.9* 1.9*   No results found for this basename: LIPASE, AMYLASE,  in the last 168 hours No results found for this basename: AMMONIA,  in the last 168 hours CBC:  Recent Labs Lab 08/05/13 1348  08/07/13 0459 08/07/13 1719 08/08/13 0418 08/09/13 0451 08/10/13 0459 08/11/13 0422  WBC 19.4*  < > 20.4*  --  18.5* 21.3* 16.7* 15.7*  NEUTROABS 17.3*  --   --   --   --   --   --   --   HGB 8.0*  < >  8.0* 10.0* 8.9* 9.7* 9.5* 9.7*  HCT 24.6*  < > 24.6* 30.2* 27.1* 29.4* 29.6* 30.2*  MCV 92.5  < > 92.5  --  91.2 89.9 92.2 92.1  PLT 259  < > 256  --  247 225 252 266  < > = values in this interval not displayed. Cardiac Enzymes: No results found for this basename: CKTOTAL, CKMB, CKMBINDEX, TROPONINI,  in the last 168 hours BNP (last 3 results) No results found for this basename: PROBNP,  in the last 8760 hours CBG:  Recent Labs Lab 08/10/13 0744 08/10/13 1136 08/10/13 1616 08/10/13 2129 08/11/13 0745  GLUCAP 149* 199* 217* 174* 151*    Recent Results (from the past 240 hour(s))  CULTURE, BLOOD (ROUTINE X 2)     Status: None   Collection Time    08/05/13  3:04 PM      Result Value Range Status   Specimen Description BLOOD LEFT HAND   Final   Special Requests BOTTLES DRAWN AEROBIC AND ANAEROBIC 6CC   Final   Culture NO GROWTH 5 DAYS   Final   Report Status 08/10/2013  FINAL   Final  CULTURE, BLOOD (ROUTINE X 2)     Status: None   Collection Time    08/05/13  3:13 PM      Result Value Range Status   Specimen Description BLOOD LEFT ARM   Final   Special Requests BOTTLES DRAWN AEROBIC ONLY 4CC   Final   Culture NO GROWTH 5 DAYS   Final   Report Status 08/10/2013 FINAL   Final  SURGICAL PCR SCREEN     Status: None   Collection Time    08/08/13  9:02 AM      Result Value Range Status   MRSA, PCR NEGATIVE  NEGATIVE Final   Staphylococcus aureus NEGATIVE  NEGATIVE Final   Comment:            The Xpert SA Assay (FDA     approved for NASAL specimens     in patients over 73 years of age),     is one component of     a comprehensive surveillance     program.  Test performance has     been validated by [employer] for patients greater     than or equal to 51 year old.     It is not intended     to diagnose infection nor to     guide or monitor treatment.     Studies: No results found.  Scheduled Meds: . amiodarone  100 mg Oral BID  . atorvastatin  20 mg Oral q1800  . diltiazem  180 mg Oral Daily  . epoetin alfa  12,000 Units Intravenous 3 times weekly  . feeding supplement (NEPRO CARB STEADY)  237 mL Oral BID BM  . feeding supplement (PRO-STAT SUGAR FREE 64)  30 mL Oral TID WC  . folic acid  1 mg Oral Daily  . insulin aspart  0-9 Units Subcutaneous TID WC  . insulin glargine  10 Units Subcutaneous BID  . metoprolol tartrate  25 mg Oral TID  . multivitamin with minerals  1 tablet Oral Daily  . pantoprazole  40 mg Oral BID  . piperacillin-tazobactam (ZOSYN)  IV  2.25 g Intravenous Q8H  . sevelamer carbonate  1,600 mg Oral TID WC  . sodium chloride  10-40 mL Intracatheter Q12H  . sodium chloride  3 mL Intravenous Q12H  . traZODone  50 mg Oral QHS  . vancomycin  1,000 mg Intravenous Q T,Th,Sa-HD  . Warfarin - Pharmacist Dosing Inpatient   Does not apply Q24H   Continuous Infusions:    Assessment:  Principal Problem:   Sepsis Active  Problems:   Osteomyelitis of right foot   Atherosclerosis of native arteries of the extremities with gangrene   Atrial fibrillation   [place] term (current) use of anticoagulants   CAD (coronary artery disease)   ESRD (end stage renal disease) on dialysis   DM type 2 causing ESRD   Hypotension   Hypoglycemia associated with diabetes   Anemia of chronic disease   Hyperbilirubinemia     1.Osteomyelitis of the first metatarsal with associated cellulitis and gangrene of the right foot secondary to peripheral vascular disease. Status post right BKA, postoperative day #3. The right stump operative site is healing well. He is afebrile and his white blood cell count is improving. PT ordered per Dr. Lovell Sheehan. Continue antibiotics for now.   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 Previously treated with a wound VAC on the right foot per Dr. Lovell Sheehan.   Sepsis secondary to #1. Resolved. Noted lactic acidosis on admission. Continue antibiotics and supportive treatment as above.  Chronic atrial fibrillation with rapid ventricular response. His heart rate is in the 110-120 range. Amiodarone was increased to twice a day on 08/09/2013. Diltiazem was restarted. Will increase metoprolol to 25 mg 3 times a day as time not sure if his blood pressure will tolerate his home dose of 100 mg twice a day yet.  Chronic anticoagulation with warfarin secondary to atrial fibrillation. Coumadin was restarted. His INR quickly became super therapeutic. We'll hold Coumadin per pharmacy.  CAD/ischemic cardiomyopathy. Aspirin is  on hold for now.  Torsemide is on hold, but he is  receiving hemodialysis. Metoprolol was restarted at a reduced dosing.  Diabetes mellitus. Episode of hypoglycemia on 12/10 was treated with an amp of D50 and gentle dextrose infusion and decrease in insulin. Now resolved. His capillary blood glucose is waxing and waning.  Continue sliding scale NovoLog. Lantus restarted cautiously. We'll  continue to make adjustments in insulin dosing accordingly.  End-stage renal disease. Nephrology consulted. Hemodialysis per nephrology. Appreciate nephrology's assistance.  Anemia of critical illness and chronic disease. Status post 2 units of packed red blood cell transfusions prior to surgery and one unit following the operation. His hemoglobin is 9.5 today. He is also receiving Epogen per nephrology.   Hyperbilirubinemia. Per chart review, this appears to be chronic. We'll continue to follow.  Disposition. The patient has accepted that he will need short-term skilled nursing facility placement for rehabilitation versus CIR. Social worker is involved. We'll order OT consult.  Plan: 1. OT consult along with PT consult. 2. Transfer to telemetry. 3. Discharge planning. 4. Planned hemodialysis tomorrow, per Dr. Kristian Covey. 4. Monitor his INR/PT and CBC. Adjustments in Coumadin per pharmacy.   Time spent: 30 minutes.    [facility]  [facility] Pager [phone]. If 7PM-7AM, please contact night-coverage at [email or web address], password [facility] 08/11/2013, 10:10 AM  LOS: 6 days

## 2013-08-11 NOTE — Progress Notes (Signed)
Right below the knee of dictation site healing very well. No apparent hematoma present. Incision healing well. Patient will need rehabilitation prior to going home. Physical therapy evaluated the patient. Social services aware. Sutures and staples need to stay in for 3 weeks.

## 2013-08-11 NOTE — Progress Notes (Signed)
Subjective: Interval History: Patient complains of some leg pain. He denies any nausea no vomiting. His appetite is good. Patient also denies any difficulty in breathing.  Objective: Vital signs in last 24 hours: Temp:  [97.6 F (36.4 C)-98.6 F (37 C)] 97.6 F (36.4 C) (12/15 0400) Pulse Rate:  [110-113] 113 (12/14 1700) Resp:  [13-27] 17 (12/15 0600) BP: (100-126)/(62-89) 125/83 mmHg (12/15 0600) SpO2:  [92 %-100 %] 96 % (12/14 1700) Weight:  [101.8 kg (224 lb 6.9 oz)] 101.8 kg (224 lb 6.9 oz) (12/15 0500) Weight change: 0 kg (0 lb)  Intake/Output from previous day: 12/14 0701 - 12/15 0700 In: 723 [P.O.:540; I.V.:33; IV Piggyback:150] Out: -  Intake/Output this shift:   Patient is alert in no apparent distress. Chest is clear to auscultation. He does not have any rales rhonchi or egophony. Heart irigular rate and rhythm III/VI Abdomen soft none tennder Extremities trac edema on the left and s/p BKA on the right   Lab Results:  Recent Labs  08/10/13 0459 08/11/13 0422  WBC 16.7* 15.7*  HGB 9.5* 9.7*  HCT 29.6* 30.2*  PLT 252 266   BMET:   Recent Labs  08/09/13 0451 08/11/13 0422  NA 137 136  K 4.5 3.8  CL 95* 94*  CO2 25 28  GLUCOSE 267* 174*  BUN 59* 53*  CREATININE 8.66* 7.91*  CALCIUM 10.2 10.0   No results found for this basename: PTH,  in the last 72 hours Iron Studies: No results found for this basename: IRON, TIBC, TRANSFERRIN, FERRITIN,  in the last 72 hours  Studies/Results: No results found.  I have reviewed the patient's current medications.  Assessment/Plan: Problem #1 end-stage renal disease he is status post hemodialysis on Saturday.Marland Kitchen His BUN is 53 and his creatinine is 7.9 and potassium is 3.8. No uremic sign and symptoms Problem #2 cellulitis/was to colitis of his right toe. Patient on antibiotics and he has cellulitis/osteomyelitis of right foot. S/p BKA. Presently patient is a febrile and his white blood cell count is  improving. Problem #3 anemia:. His hemoglobin and hematocrit is low but stable.. Presently patient is on Epogen. Problem #4 history of diabetes Problem #5 a trial fibrillation Problem #6 history of coronary artery disease status post CABG. Patient does not have any chest pain. Problem #7 status post mitral valve replacement  Problem #8 metabolic bone disease his calcium and phosphorus is with  in acceptable range. Plan: We'll do dialysis today          Basic metabolic panel and CB Cin am          We'll make arrangement for patient to get dialysis tomorrow.   LOS: 6 days   Vencent Hauschild S 08/11/2013,7:55 AM

## 2013-08-12 ENCOUNTER — Inpatient Hospital Stay (HOSPITAL_COMMUNITY): Payer: Medicare Other

## 2013-08-12 DIAGNOSIS — J189 Pneumonia, unspecified organism: Secondary | ICD-10-CM | POA: Diagnosis not present

## 2013-08-12 LAB — CBC
HCT: 32.1 % — ABNORMAL LOW (ref 39.0–52.0)
MCH: 29.8 pg (ref 26.0–34.0)
MCHC: 32.1 g/dL (ref 30.0–36.0)
MCV: 92.8 fL (ref 78.0–100.0)
Platelets: 298 10*3/uL (ref 150–400)
RBC: 3.46 MIL/uL — ABNORMAL LOW (ref 4.22–5.81)

## 2013-08-12 LAB — GLUCOSE, CAPILLARY
Glucose-Capillary: 134 mg/dL — ABNORMAL HIGH (ref 70–99)
Glucose-Capillary: 154 mg/dL — ABNORMAL HIGH (ref 70–99)

## 2013-08-12 LAB — BASIC METABOLIC PANEL
BUN: 71 mg/dL — ABNORMAL HIGH (ref 6–23)
Chloride: 93 mEq/L — ABNORMAL LOW (ref 96–112)
Creatinine, Ser: 9.73 mg/dL — ABNORMAL HIGH (ref 0.50–1.35)
GFR calc non Af Amer: 5 mL/min — ABNORMAL LOW (ref >90)
Glucose, Bld: 161 mg/dL — ABNORMAL HIGH (ref 70–99)
Potassium: 4.2 mEq/L (ref 3.5–5.1)
Sodium: 136 mEq/L (ref 135–145)

## 2013-08-12 LAB — VANCOMYCIN, TROUGH: Vancomycin Tr: 19.7 ug/mL (ref 10.0–20.0)

## 2013-08-12 MED ORDER — SODIUM CHLORIDE 0.9 % IV SOLN: 100.0000 mL | INTRAVENOUS | Status: DC | PRN

## 2013-08-12 MED ORDER — LEVALBUTEROL HCL 0.63 MG/3ML IN NEBU
0.6300 mg | INHALATION_SOLUTION | Freq: Three times a day (TID) | RESPIRATORY_TRACT | Status: DC
  Administered 2013-08-12: 0.63 mg via RESPIRATORY_TRACT
  Filled 2013-08-12: qty 3

## 2013-08-12 MED ORDER — LEVALBUTEROL HCL 0.63 MG/3ML IN NEBU: 0.6300 mg | INHALATION_SOLUTION | Freq: Four times a day (QID) | RESPIRATORY_TRACT | Status: DC | PRN

## 2013-08-12 MED ORDER — LEVOFLOXACIN 500 MG PO TABS
500.0000 mg | ORAL_TABLET | ORAL | Status: DC
  Administered 2013-08-12: 500 mg via ORAL
  Filled 2013-08-12: qty 1

## 2013-08-12 MED ORDER — ALTEPLASE 2 MG IJ SOLR
2.0000 mg | Freq: Once | INTRAMUSCULAR | Status: AC | PRN
  Filled 2013-08-12: qty 2

## 2013-08-12 NOTE — Progress Notes (Signed)
PT Cancellation Note  Patient Details Name: [name] [name] MRN: [id number] DOB: [birth year]   Cancelled Treatment:    Reason Eval/Treat Not Completed: Patient at procedure or test/unavailable   Seth Bake, PTA  08/12/2013, 2:06 PM

## 2013-08-12 NOTE — Progress Notes (Signed)
Subjective: Interval History: Patient's leg pain is better. He denies any nausea no vomiting. His appetite is good. Patient also denies any difficulty in breathing.He is feeling better  Objective: Vital signs in last 24 hours: Temp:  [97.5 F (36.4 C)-97.9 F (36.6 C)] 97.7 F (36.5 C) (12/16 0531) Pulse Rate:  [97-109] 97 (12/16 0531) Resp:  [15-29] 16 (12/16 0531) BP: (122-141)/(73-104) 124/73 mmHg (12/16 0531) SpO2:  [89 %-94 %] 89 % (12/16 0531) Weight change:   Intake/Output from previous day: 12/15 0701 - 12/16 0700 In: 843 [P.O.:660; I.V.:33; IV Piggyback:150] Out: 100 [Urine:100] Intake/Output this shift:   Patient is alert in no apparent distress. Chest is clear to auscultation. He does not have any rales rhonchi or egophony. Heart irigular rate and rhythm III/VI Abdomen soft none tennder Extremities trac edema on the left and s/p BKA on the right   Lab Results:  Recent Labs  08/11/13 0422 08/12/13 0544  WBC 15.7* 16.0*  HGB 9.7* 10.3*  HCT 30.2* 32.1*  PLT 266 298   BMET:   Recent Labs  08/11/13 0422 08/12/13 0544  NA 136 136  K 3.8 4.2  CL 94* 93*  CO2 28 25  GLUCOSE 174* 161*  BUN 53* 71*  CREATININE 7.91* 9.73*  CALCIUM 10.0 10.0   No results found for this basename: PTH,  in the last 72 hours Iron Studies: No results found for this basename: IRON, TIBC, TRANSFERRIN, FERRITIN,  in the last 72 hours  Studies/Results: No results found.  I have reviewed the patient's current medications.  Assessment/Plan: Problem #1 end-stage renal disease he is status post hemodialysis on Saturday.Marland Kitchen His BUN is 71 and his creatinine is 79.73 and potassium is 4.2. No uremic sign and symptoms Problem #2 cellulitis/was to colitis of his right toe. Patient on antibiotics and he has cellulitis/osteomyelitis of right foot. S/p BKA. Presently patient is a febrile and his white blood cell count is improving. Problem #3 anemia:. His hemoglobin and hematocrit is low  but stable.. Presently patient is on Epogen. Problem #4 history of diabetes Problem #5 a trial fibrillation Problem #6 history of coronary artery disease status post CABG. Patient does not have any chest pain. Problem #7 status post mitral valve replacement  Problem #8 metabolic bone disease his calcium and phosphorus is with  in acceptable range. Plan: We'll do dialysis today          Basic metabolic panel and CB Cin am            LOS: 7 days   Armiyah Capron S 08/12/2013,8:02 AM

## 2013-08-12 NOTE — Clinical Social Work Note (Signed)
CSW called [name] which was pt's preference. Currently considering pt. Pt does have offer from [facility]. Not stable for d/c today. Possible CIR as well. CSW updated pt and he would like to see if [name] has a bed before making a decision. Will continue to follow.   Derenda Fennel, [place] [phone]

## 2013-08-12 NOTE — Progress Notes (Signed)
Patient doing well. No significant incisional pain. Right lower extremity dressing dry and intact. Nothing further to from surgical standpoint.

## 2013-08-12 NOTE — Progress Notes (Signed)
[facility] PROGRESS NOTE  [name] [name] BMW:[id number] DOB: 1951-03-16 DOA: 08/05/2013 PCP: Cassell Smiles., MD    Code Status: Full code Family Communication:  Disposition Plan: Plan to discharge to skilled nursing facility or inpatient [facility] rehabilitation when clinically appropriate likely over the next 24-48 hours.   Consultants:  General surgeon, Dr. 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 Lovell Sheehan.  Antibiotics:  Levaquin 08/13/2103>>  IV vancomycin 08/05/2013>>  Zosyn 08/07/2013>>  IV ceftaz 08/05/2013>> 08/07/2013   HPI/Subjective: The patient is has some right foot soreness. He denies chest pain, shortness of breath, abdominal pain, or diarrhea.  Objective: Filed Vitals:   08/12/13 1445  BP: 108/65  Pulse: 88  Temp: 97.9 F (36.6 C)  Resp: 20    Oxygen saturation 96%.    Intake/Output Summary (Last 24 hours) at 08/12/13 1531 Last data filed at 08/12/13 1425  Gross per 24 hour  Intake    853 ml  Output   2500 ml  Net  -1647 ml   Filed Weights   08/11/13 0500 08/12/13 1015 08/12/13 1445  Weight: 101.8 kg (224 lb 6.9 oz) 100.4 kg (221 lb 5.5 oz) 97.7 kg (215 lb 6.2 oz)    Exam:   General:  pleasant alert 62 year old [name] sitting up in bed, in no acute distress.  Cardiovascular: irregular, irregular, with mild tachycardia.  Respiratory: occasional wheezes auscultated in the bases. Breathing is nonlabored.  Abdomen: positive bowel sounds, soft, nontender, nondistended.  Musculoskeletal/extremities: Yesterday's exam-Right lower extremity BKA site with staples and sutures in place. No surrounding edema or erythema. Mildly tender to palpation. Currently dressing is in place, not uncovered. The dressing is clean and dry.  Neurologic: He is alert and oriented x2. Cranial nerves II through XII are grossly intact.  Data  Reviewed: Basic Metabolic Panel:  Recent Labs Lab 08/07/13 0459 08/08/13 0418 08/09/13 0451 08/10/13 0459 08/11/13 0422 08/12/13 0544  NA 134* 136 137  --  136 136  K 4.3 4.0 4.5  --  3.8 4.2  CL 90* 92* 95*  --  94* 93*  CO2 22 28 25   --  28 25  GLUCOSE 172* 188* 267*  --  174* 161*  BUN 48* 44* 59*  --  53* 71*  CREATININE 9.23* 6.95* 8.66*  --  7.91* 9.73*  CALCIUM 10.4 10.2 10.2  --  10.0 10.0  PHOS 7.2* 5.1* 6.1* 4.8* 4.6  --    Liver Function Tests:  Recent Labs Lab 08/07/13 0459 08/08/13 0418 08/09/13 0451 08/09/13 0500  AST  --   --   --  34  ALT  --   --   --  14  ALKPHOS  --   --   --  88  BILITOT  --   --   --  3.4*  PROT  --   --   --  6.7  ALBUMIN 2.2* 2.0* 1.9* 1.9*   No results found for this basename: LIPASE, AMYLASE,  in the last 168 hours No results found for this basename: AMMONIA,  in the last 168 hours CBC:  Recent Labs Lab 08/08/13 0418 08/09/13 0451 08/10/13 0459 08/11/13 0422 08/12/13 0544  WBC 18.5* 21.3* 16.7* 15.7* 16.0*  HGB 8.9* 9.7* 9.5* 9.7* 10.3*  HCT 27.1* 29.4* 29.6* 30.2* 32.1*  MCV 91.2 89.9 92.2 92.1 92.8  PLT 247 225 252 266 298   Cardiac Enzymes: No results found for  this basename: CKTOTAL, CKMB, CKMBINDEX, TROPONINI,  in the last 168 hours BNP (last 3 results) No results found for this basename: PROBNP,  in the last 8760 hours CBG:  Recent Labs Lab 08/11/13 1202 08/11/13 1600 08/11/13 2150 08/12/13 0708 08/12/13 1124  GLUCAP 173* 153* 149* 134* 154*    Recent Results (from the past 240 hour(s))  CULTURE, BLOOD (ROUTINE X 2)     Status: None   Collection Time    08/05/13  3:04 PM      Result Value Range Status   Specimen Description BLOOD LEFT HAND   Final   Special Requests BOTTLES DRAWN AEROBIC AND ANAEROBIC 6CC   Final   Culture NO GROWTH 5 DAYS   Final   Report Status 08/10/2013 FINAL   Final  CULTURE, BLOOD (ROUTINE X 2)     Status: None   Collection Time    08/05/13  3:13 PM      Result Value  Range Status   Specimen Description BLOOD LEFT ARM   Final   Special Requests BOTTLES DRAWN AEROBIC ONLY 4CC   Final   Culture NO GROWTH 5 DAYS   Final   Report Status 08/10/2013 FINAL   Final  SURGICAL PCR SCREEN     Status: None   Collection Time    08/08/13  9:02 AM      Result Value Range Status   MRSA, PCR NEGATIVE  NEGATIVE Final   Staphylococcus aureus NEGATIVE  NEGATIVE Final   Comment:            The Xpert SA Assay (FDA     approved for NASAL specimens     in patients over 63 years of age),     is one component of     a comprehensive surveillance     program.  Test performance has     been validated by [employer] for patients greater     than or equal to 42 year old.     It is not intended     to diagnose infection nor to     guide or monitor treatment.     Studies: Dg Chest Port 1 View  08/12/2013   CLINICAL DATA:  Elevated white blood cell count.  EXAM: PORTABLE CHEST - 1 VIEW  COMPARISON:  12/05/2011  FINDINGS: Cardiomegaly with vascular congestion. Airspace opacity in the right lower lobe with small right pleural effusion. This is concerning for pneumonia. There may be a component of mild interstitial edema. No effusion on the left or confluent airspace opacity on the left. No acute bony abnormality.  IMPRESSION: Cardiomegaly with vascular congestion, question early interstitial edema.  Focal airspace opacity in the right lung base concerning for pneumonia.  Small right effusion.   Electronically Signed   By: Charlett Nose M.D.   On: 08/12/2013 09:42    Scheduled Meds: . amiodarone  100 mg Oral BID  . atorvastatin  20 mg Oral q1800  . diltiazem  180 mg Oral Daily  . epoetin alfa  12,000 Units Intravenous 3 times weekly  . feeding supplement (NEPRO CARB STEADY)  237 mL Oral BID BM  . feeding supplement (PRO-STAT SUGAR FREE 64)  30 mL Oral TID WC  . folic acid  1 mg Oral Daily  . insulin aspart  0-9 Units Subcutaneous TID WC  . insulin glargine  10 Units  Subcutaneous BID  . metoprolol tartrate  25 mg Oral TID  . multivitamin with  minerals  1 tablet Oral Daily  . pantoprazole  40 mg Oral BID  . piperacillin-tazobactam (ZOSYN)  IV  2.25 g Intravenous Q8H  . sevelamer carbonate  1,600 mg Oral TID WC  . sodium chloride  10-40 mL Intracatheter Q12H  . sodium chloride  3 mL Intravenous Q12H  . traZODone  50 mg Oral QHS  . vancomycin  1,000 mg Intravenous Q T,Th,Sa-HD  . Warfarin - Pharmacist Dosing Inpatient   Does not apply Q24H   Continuous Infusions:     Brief history on admission 08/05/2013:  Patient is a 62 year old African American man with a past medical history significant for end-stage renal disease on hemodialysis Tuesday, Thursday, Saturday, atrial fibrillation maintained on chronic anticoagulation with Coumadin, prior osteomyelitis of the right big toe status post amputation on 07/14/2013. He was treated with a wound VAC and receiving vancomycin and ceftaz edema with dialysis, diabetes mellitus. He presented to the hospital today with a blood pressure of 90/72 and noticed immediately following dialysis. They did administer 400 cc of saline and brought him to the hospital for evaluation. In the ED, he was found to have a temperature of 100.7, a lactic acid of 2.8, WBC count of 19.4, as well as a foot x-ray that shows continued osteomyelitis of the first metatarsal. EDP had discussed with Dr. Lovell Sheehan, surgeon, who has requested admission for amputation. We were asked to admit him for further evaluation and management.  Currently, the patient is postoperative day #4, right BKA. His stump is healing well. He still has an elevated white blood cell count. He is afebrile. Blood cultures are negative to date. Chest x-ray today revealed probable right base pneumonia. We'll add oral Levaquin to Zosyn and vancomycin. His Coumadin was restarted. His INR is now supratherapeutic. Anticipate that the patient will need only oral Levaquin at the time of  discharge. Discharge will be to CIR or skilled nursing facility in the next 24-48 hours.   Assessment:  Principal Problem:   Sepsis Active Problems:   Osteomyelitis of right foot   Atherosclerosis of native arteries of the extremities with gangrene   HCAP (healthcare-associated pneumonia)   Atrial fibrillation   Deharo term (current) use of anticoagulants   CAD (coronary artery disease)   ESRD (end stage renal disease) on dialysis   DM type 2 causing ESRD   Hypotension   Hypoglycemia associated with diabetes   Anemia of chronic disease   Hyperbilirubinemia     1.Osteomyelitis of the first metatarsal with associated cellulitis and gangrene of the right foot secondary to peripheral vascular disease. Status post right BKA, by Dr. Lovell Sheehan, postoperative day #4. The right stump operative site is healing well. The patient is afebrile and his white blood cell count has improved overall, but increased a little since yesterday. Per Dr. Lovell Sheehan, the patient's sutures and staples need to stay in for 3 weeks. The patient should be scheduled for a followup with Dr. Lovell Sheehan. We'll continue Zosyn and vancomycin until discharge.   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 Previously treated with a wound VAC on the right foot per Dr. Lovell Sheehan.   Sepsis and lactic acidosis secondary to #1. Resolved with supportive treatment and IV antibiotics and BKA..  Query new small right lower lobe pneumonia. He is not particularly hypoxic, but he does have a few wheezes. We'll add oral Levaquin empirically today. Per pharmacy, it can be dosed 500 mg every other day following dialysis. Would treat for one week.  Chronic atrial  fibrillation with rapid ventricular response. For the first few days of the hospitalization, the patient's heart rate ranged from 110-120. Metoprolol and diltiazem were temporally withheld because of his low blood pressures, but amiodarone was continued at 100 mg daily. Diltiazem has  been since restarted since his blood pressure improved. The dosing of metoprolol is not quite back to the home dose of 100 mg twice a day, but he is receiving 25 mg 3 times a day. Amiodarone was increased to twice a day on 12/13.. If his blood pressure continues to improve, would favor changing metoprolol back to 100 mg twice a day and amiodarone back to once daily. If not, keep dosing as is. His heart rate has been ranging from 71-100 today.  Chronic anticoagulation with warfarin secondary to atrial fibrillation/coagulopathy-supratherapeutic INR. Coumadin was withheld prior to and immediately following the operation. It was started a couple days ago. His INR has quickly become super therapeutic. There is no obvious gross bleeding. Coumadin is being held per pharmacy. His INR will be monitored daily.  CAD/ischemic cardiomyopathy. Aspirin is  on hold for now to reduce risk of bleeding.  Torsemide is on hold because of soft blood pressures, but he is  receiving hemodialysis. Metoprolol was restarted at a reduced dosing as stated above.  Diabetes mellitus. The patient had one episode of hypoglycemia on 12/10 which was treated with an amp of D50 and gentle dextrose infusion and decrease in insulin. Now resolved. His capillary blood glucose is waxing and waning but overall improved.  Continue sliding scale NovoLog. Lantus restarted cautiously. We'll continue to make adjustments in insulin dosing accordingly.  End-stage renal disease. He is being dialyzed per nephrology. Nephrology has been following the patient. The patient schedule is Tuesday-Thursday-Saturday.  Anemia of critical illness and chronic disease. The patient's hemoglobin trended downward to 8.0 preop. He was transfused 2 units of packed red blood cells prior to surgery with anticipation that he would have blood loss. He was also transfused 1 unit following the operation. His hemoglobin improved to 9.5 and has been stable ranging from 9.5-10.3. He  is also receiving Epogen per nephrology.   Hyperbilirubinemia. Per chart review, this appears to be chronic. We'll continue to follow.  Disposition. The plan is to discharge the patient over the next 24-48 hours if he remains stable and/or improved. Will watch his white blood cell count trend and the trend of his INR. He is likely a candidate for CIR if a bed is available. Otherwise, he can go to short-term skilled nursing facility.  Plan: 1. As above. We'll add Levaquin every other day. We'll add Xopenex nebulizer. We'll add a probiotic given treatment with multiple antibiotics.   Time spent: 30 minutes.    [facility]  [facility] Pager [phone]. If 7PM-7AM, please contact night-coverage at [email or web address], password [facility] 08/12/2013, 3:31 PM  LOS: 7 days

## 2013-08-12 NOTE — Progress Notes (Signed)
ANTIBIOTIC CONSULT NOTE  Pharmacy Consult for Vancomycin and Zosyn Indication: Osteomyelitis Right Foot  Allergies  Allergen Reactions  . Bacitracin Other (See Comments)    unknown  . Norvasc [Amlodipine Besylate] Other (See Comments)    unknown   Patient Measurements: Height: 6\' 4"  (193 cm) Weight: 221 lb 5.5 oz (100.4 kg) IBW/kg (Calculated) : 86.8  Vital Signs: Temp: 97.9 F (36.6 C) (12/16 1015) Temp src: Oral (12/16 1015) BP: 109/72 mmHg (12/16 1300) Pulse Rate: 98 (12/16 1300) Intake/Output from previous day: 12/15 0701 - 12/16 0700 In: 843 [P.O.:660; I.V.:33; IV Piggyback:150] Out: 100 [Urine:100] Intake/Output from this shift: Total I/O In: 180 [P.O.:180] Out: -   Labs:  Recent Labs  08/10/13 0459 08/11/13 0422 08/12/13 0544  WBC 16.7* 15.7* 16.0*  HGB 9.5* 9.7* 10.3*  PLT 252 266 298  CREATININE  --  7.91* 9.73*   Estimated Creatinine Clearance: 9.4 ml/min (by C-G formula based on Cr of 9.73).  Recent Labs  08/12/13 0951  VANCOTROUGH 19.7    Microbiology: Recent Results (from the past 720 hour(s))  CULTURE, BLOOD (ROUTINE X 2)     Status: None   Collection Time    08/05/13  3:04 PM      Result Value Range Status   Specimen Description BLOOD LEFT HAND   Final   Special Requests BOTTLES DRAWN AEROBIC AND ANAEROBIC 6CC   Final   Culture NO GROWTH 5 DAYS   Final   Report Status 08/10/2013 FINAL   Final  CULTURE, BLOOD (ROUTINE X 2)     Status: None   Collection Time    08/05/13  3:13 PM      Result Value Range Status   Specimen Description BLOOD LEFT ARM   Final   Special Requests BOTTLES DRAWN AEROBIC ONLY 4CC   Final   Culture NO GROWTH 5 DAYS   Final   Report Status 08/10/2013 FINAL   Final  SURGICAL PCR SCREEN     Status: None   Collection Time    08/08/13  9:02 AM      Result Value Range Status   MRSA, PCR NEGATIVE  NEGATIVE Final   Staphylococcus aureus NEGATIVE  NEGATIVE Final   Comment:            The Xpert SA Assay (FDA   approved for NASAL specimens     in patients over 79 years of age),     is one component of     a comprehensive surveillance     program.  Test performance has     been validated by [employer] for patients greater     than or equal to 78 year old.     It is not intended     to diagnose infection nor to     guide or monitor treatment.   Medical History: Past Medical History  Diagnosis Date  . UGI bleed 02/12/11    on anticoagulation; EGD w/snare polypectomy-multiple polypoid lesions antrum(benign), Chronic active gastritis (NEGATIVE H pylori)  . Anemia, chronic disease   . Sepsis due to enterococcus 02/11/11  . Atrial fibrillation     coumadin  . CAD (coronary artery disease)     s/p CABGx4  . Cardiomyopathy     40-45% EF 6/12  . Diabetes mellitus   . Hypertension   . Hyperlipemia   . Gout   . S/P patent foramen ovale closure   . History of nuclear stress test 11/2011  negative myoview  . End stage kidney disease     T/TH/SAT dialyis, Dr Fausto Skillern  . Gout   . History of nuclear stress test 07/04/2010    dipyridamole; mod fixed inferolateral defect; non-diagnostic for ischemia; low risk scan   . Hyperbilirubinemia 08/10/2013   Medications:  Scheduled:  . amiodarone  100 mg Oral BID  . atorvastatin  20 mg Oral q1800  . diltiazem  180 mg Oral Daily  . epoetin alfa  12,000 Units Intravenous 3 times weekly  . feeding supplement (NEPRO CARB STEADY)  237 mL Oral BID BM  . feeding supplement (PRO-STAT SUGAR FREE 64)  30 mL Oral TID WC  . folic acid  1 mg Oral Daily  . insulin aspart  0-9 Units Subcutaneous TID WC  . insulin glargine  10 Units Subcutaneous BID  . metoprolol tartrate  25 mg Oral TID  . multivitamin with minerals  1 tablet Oral Daily  . pantoprazole  40 mg Oral BID  . piperacillin-tazobactam (ZOSYN)  IV  2.25 g Intravenous Q8H  . sevelamer carbonate  1,600 mg Oral TID WC  . sodium chloride  10-40 mL Intracatheter Q12H  . sodium chloride  3 mL Intravenous  Q12H  . traZODone  50 mg Oral QHS  . vancomycin  1,000 mg Intravenous Q T,Th,Sa-HD  . Warfarin - Pharmacist Dosing Inpatient   Does not apply Q24H   Assessment: 62 yo obese M with ESRD.  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   Patient is afebrile, but WBC is trending down. He remains on broad spectrum antibiotics.  Pre-HD Vancomycin level is on target.  Goal of Therapy:  Pre-Hemodialysis Vancomycin level goal range =15-25 mcg/ml Eradicate infection.  Plan:  Continue Vancomycin 1000mg  IV every HD. Zosyn 2.25gm IV q8h Re-check Vancomycin level when indicated Duration of therapy per MD  Valrie Hart A 08/12/2013,1:09 PM

## 2013-08-12 NOTE — Progress Notes (Signed)
ANTICOAGULATION CONSULT NOTE -  Pharmacy Consult for Warfarin Indication: atrial fibrillation  Allergies  Allergen Reactions  . Bacitracin Other (See Comments)    unknown  . Norvasc [Amlodipine Besylate] Other (See Comments)    unknown   Patient Measurements: Height: 6\' 4"  (193 cm) Weight: 224 lb 6.9 oz (101.8 kg) IBW/kg (Calculated) : 86.8  Vital Signs: Temp: 97.7 F (36.5 C) (12/16 0531) Temp src: Oral (12/16 0531) BP: 124/73 mmHg (12/16 0531) Pulse Rate: 97 (12/16 0531)  Labs:  Recent Labs  08/10/13 0459 08/11/13 0422 08/12/13 0544  HGB 9.5* 9.7* 10.3*  HCT 29.6* 30.2* 32.1*  PLT 252 266 298  LABPROT  --  34.4* 43.9*  INR  --  3.58* 4.92*  CREATININE  --  7.91* 9.73*   Estimated Creatinine Clearance: 9.4 ml/min (by C-G formula based on Cr of 9.73).  Medical History: Past Medical History  Diagnosis Date  . UGI bleed 02/12/11    on anticoagulation; EGD w/snare polypectomy-multiple polypoid lesions antrum(benign), Chronic active gastritis (NEGATIVE H pylori)  . Anemia, chronic disease   . Sepsis due to enterococcus 02/11/11  . Atrial fibrillation     coumadin  . CAD (coronary artery disease)     s/p CABGx4  . Cardiomyopathy     40-45% EF 6/12  . Diabetes mellitus   . Hypertension   . Hyperlipemia   . Gout   . S/P patent foramen ovale closure   . History of nuclear stress test 11/2011    negative myoview  . End stage kidney disease     T/TH/SAT dialyis, Dr Fausto Skillern  . Gout   . History of nuclear stress test 07/04/2010    dipyridamole; mod fixed inferolateral defect; non-diagnostic for ischemia; low risk scan   . Hyperbilirubinemia 08/10/2013   Medications:  Prescriptions prior to admission  Medication Sig Dispense Refill  . allopurinol (ZYLOPRIM) 100 MG tablet Take 100 mg by mouth daily.       Marland Kitchen amiodarone (PACERONE) 200 MG tablet Take 100 mg by mouth daily.       Marland Kitchen aspirin 81 MG tablet Take 81 mg by mouth daily.        Marland Kitchen atorvastatin (LIPITOR) 20  MG tablet Take 1 tablet (20 mg total) by mouth daily.  30 tablet  11  . B Complex-C-Zn-Folic Acid (DIALYVITE/ZINC PO) Take by mouth 3 (three) times a week. Patient has dialysis on Tuesdays, Thursdays, and Saturdays      . diltiazem (CARDIZEM CD) 180 MG 24 hr capsule Take 1 capsule (180 mg total) by mouth daily.  30 capsule  1  . folic acid (FOLVITE) 1 MG tablet Take 1 mg by mouth daily.        Marland Kitchen glimepiride (AMARYL) 4 MG tablet Take 4 mg by mouth 2 (two) times daily.        . Insulin Glargine (LANTUS SOLOSTAR) 100 UNIT/ML SOPN Inject 38 Units into the skin at bedtime.      . metoprolol (LOPRESSOR) 100 MG tablet Take 1 tablet (100 mg total) by mouth 2 (two) times daily.  60 tablet  1  . Multiple Vitamin (MULTIVITAMIN) capsule Take 1 capsule by mouth daily.      . pantoprazole (PROTONIX) 40 MG tablet Take 40 mg by mouth 2 (two) times daily.       Marland Kitchen RENVELA 800 MG tablet Take 800 mg by mouth 3 (three) times daily with meals.       . torsemide (DEMADEX) 20 MG tablet Take 20  mg by mouth 2 (two) times daily.      . traZODone (DESYREL) 50 MG tablet Take 50 mg by mouth at bedtime.      Marland Kitchen warfarin (COUMADIN) 2.5 MG tablet Take 1.25 mg by mouth daily.       Assessment: 62yo male admitted for osteomyelitis and suspected sepsis.  Pt is on Coumadin PTA for h/o afib. S/P BKA this admission. INR was 1.71 on admission.   Coumadin was restarted after surgery & INR is now elevated > 4.  No bleeding noted.   Goal of Therapy:  INR 2-3   Plan:  Hold warfarin today Daily PT/INR  Margo Aye, Arhaan Chesnut A 08/12/2013,9:00 AM

## 2013-08-12 NOTE — Procedures (Signed)
   HEMODIALYSIS TREATMENT NOTE:  4 hour heparin-free dialysis completed via right forearm AVF (15g/antegrade).  Goal met:  Tolerated removal of 2.5 liters with no interruption in ultrafiltration.  SBP 100s-120s, HR 70-90.  Pt received Vancomycin 1g and EPO 12K with HD. All blood was reinfused.  Hemostasis achieved within 20 minutes (supratherapeutic INR).  Report given to Quita Skye, RN.  Tala Eber L. Dunia Pringle, RN, CDN

## 2013-08-12 NOTE — Evaluation (Signed)
Occupational Therapy Evaluation Patient Details Name: [name] [name] MRN: [id number] DOB: 1951-06-16 Today's Date: 08/11/2013 Time: 4098-1191 OT Time Calculation (min): 18 min  OT Assessment / Plan / Recommendation History of present illness Pt is admitted for osteomyelitis of the right great toe for which he had an amputation on 07-14-13.  He now has a wound vac on the resulting wound.  He has DM and end stage renal failure and is on HD 3x/week.  He had been totally independent at home prior to initial admission.     Clinical Impression   Patient presents with decreased BUE ROM, strength, overall activity endurance, decreased independence with self cares, mobility/transfers.  Recommend patient would benefit from skilled therapy services to maximize functional potential/independence.      OT Assessment  Patient needs continued OT Services    Follow Up Recommendations  CIR    Barriers to Discharge Other (comment) wife with recent back surg  Equipment Recommendations       Recommendations for Other Services Rehab consult  Frequency  Min 5X/week    Precautions / Restrictions Precautions Precautions: Other (comment) Precaution Comments: femoral line LLE       ADL  Eating/Feeding: Set up (dentures ) Grooming: Set up Upper Body Dressing: Minimal assistance;Moderate assistance Lower Body Dressing:  (unable to assess secondary to femoral line)    OT Diagnosis: Generalized weakness  OT Problem List: Decreased strength;Decreased activity tolerance;Decreased knowledge of use of DME or AE;Decreased range of motion OT Treatment Interventions: Self-care/ADL training;Therapeutic exercise;Energy conservation;DME and/or AE instruction;Manual therapy;Therapeutic activities;Modalities;Patient/family education;Balance training   OT Goals(Current goals can be found in the care plan section) Acute Rehab OT Goals Patient Stated Goal: to be able to walk again and be independent OT Goal  Formulation: With patient/family Time For Goal Achievement: 08/26/13 Potential to Achieve Goals: Good ADL Goals Pt Will Perform Upper Body Dressing: with min guard assist Pt Will Perform Lower Body Dressing: with max assist;with adaptive equipment Pt/caregiver will Perform Home Exercise Program: Increased ROM;Increased strength;Both right and left upper extremity;With Supervision  Visit Information  History of Present Illness: Pt is admitted for osteomyelitis of the right great toe for which he had an amputation on 07-14-13.  He now has a wound vac on the resulting wound.  He has DM and end stage renal failure and is on HD 3x/week.  He had been totally independent at home prior to initial admission.         Prior Functioning     Home Living Family/patient expects to be discharged to:: Private residence Living Arrangements: Spouse/significant other Available Help at Discharge: Family;Available 24 hours/day Type of Home: House Home Access: Stairs to enter [employer] of Steps: 3 Entrance Stairs-Rails: None Home Layout: One level Home Equipment: Walker - 2 wheels Additional Comments: Wife had back surgery on 08/04/13 Prior Function Level of Independence: Needs assistance ADL's / Homemaking Assistance Needed: wife/patient report he has needed/been receiving mod assistance with ADL bathing/dressing tasks since decline in health in November.  Comments: RW for ambulation after great toe amputation  Communication Communication: No difficulties Dominant Hand: Right         Vision/Perception Vision - History Baseline Vision: No visual deficits Patient Visual Report: No change from baseline   Cognition  Cognition Arousal/Alertness: Awake/alert Behavior During Therapy: WFL for tasks assessed/performed Overall Cognitive Status: Within Functional Limits for tasks assessed    Extremity/Trunk Assessment Upper Extremity Assessment Upper Extremity Assessment: Generalized  weakness (limited shoulder flexion AROM ;  grossly 3/5  throughout BUE ) Lower Extremity Assessment Lower Extremity Assessment: Defer to PT evaluation     Mobility Bed Mobility Bed Mobility: Supine to Sit;Sit to Supine;Rolling Right;Rolling Left Rolling Right: 6: Modified independent (Device/Increase time);With rail Rolling Left: 6: Modified independent (Device/Increase time);With rail Details for Bed Mobility Assistance: Limited due to LLE femoral line            End of Session OT - End of Session Activity Tolerance: Patient tolerated treatment well Patient left: in bed;with family/visitor present  GO     Velora Mediate, OTR/L 08/11/2013, 1:40 PM

## 2013-08-13 LAB — CBC
MCH: 30.2 pg (ref 26.0–34.0)
MCV: 94.1 fL (ref 78.0–100.0)
Platelets: 293 10*3/uL (ref 150–400)
RDW: 17 % — ABNORMAL HIGH (ref 11.5–15.5)
WBC: 14.7 10*3/uL — ABNORMAL HIGH (ref 4.0–10.5)

## 2013-08-13 LAB — BASIC METABOLIC PANEL
BUN: 40 mg/dL — ABNORMAL HIGH (ref 6–23)
Calcium: 10.1 mg/dL (ref 8.4–10.5)
GFR calc non Af Amer: 8 mL/min — ABNORMAL LOW (ref >90)
Glucose, Bld: 99 mg/dL (ref 70–99)

## 2013-08-13 LAB — GLUCOSE, CAPILLARY: Glucose-Capillary: 119 mg/dL — ABNORMAL HIGH (ref 70–99)

## 2013-08-13 MED ORDER — TRAZODONE HCL 50 MG PO TABS: 50.0000 mg | ORAL_TABLET | Freq: Every day | ORAL | Status: AC

## 2013-08-13 MED ORDER — VANCOMYCIN HCL IN DEXTROSE 1-5 GM/200ML-% IV SOLN: 1000.0000 mg | INTRAVENOUS | Status: DC

## 2013-08-13 MED ORDER — AMIODARONE HCL 200 MG PO TABS: 100.0000 mg | ORAL_TABLET | Freq: Every day | ORAL | Status: DC

## 2013-08-13 MED ORDER — METOPROLOL TARTRATE 25 MG PO TABS: 25.0000 mg | ORAL_TABLET | Freq: Three times a day (TID) | ORAL | Status: DC

## 2013-08-13 MED ORDER — LEVOFLOXACIN 500 MG PO TABS: 500.0000 mg | ORAL_TABLET | ORAL | Status: DC

## 2013-08-13 MED ORDER — INSULIN GLARGINE 100 UNIT/ML SOLOSTAR PEN: 10.0000 [IU] | PEN_INJECTOR | Freq: Every day | SUBCUTANEOUS | Status: DC

## 2013-08-13 NOTE — Progress Notes (Addendum)
Nutrition  Follow-up   INTERVENTION:  Recommend to continue with oral supplements as part of discharge orders.  Nepro Shake po BID, each supplement provides 425 kcal and 19 grams protein.  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   Goal: Pt to meet >/= 90% of their estimated nutrition needs   Monitor:  Nutritional adequacy, changes in status, labs  62 y.o. 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 Appetite remains good (75-100% of meals recorded).  Pt is planning to discharge to SNF for rehab.   Nutrition hx: Pt on dialysis for past 6 years. His appetite is good and wt is stable. Followed by surgery for gangrene to right foot. Scheduled for right BKA on 08/08/13. He like Nepro and we discussed the importance of adequate protein-energy intake given his increased nutrition needs and pending surgery.  Height: Ht Readings from Last 1 Encounters:  08/05/13 6\' 4"  (1.93 m)    Weight: Wt Readings from Last 1 Encounters:  08/13/13 223 lb 5.2 oz (101.3 kg)    Adjusted Ideal Body Weight due to new right BKA-: 190# (86.3 kg)  % Adjusted Ideal Body Weight: 117%  Wt Readings from Last 10 Encounters:  08/13/13 223 lb 5.2 oz (101.3 kg)  08/13/13 223 lb 5.2 oz (101.3 kg)  07/16/13 237 lb 10.5 oz (107.8 kg)  07/16/13 237 lb 10.5 oz (107.8 kg)  06/02/13 236 lb 6.4 oz (107.23 kg)  09/23/12 235 lb 6.4 oz (106.777 kg)  04/17/12 234 lb (106.142 kg)  12/27/11 240 lb (108.863 kg)  06/23/11 233 lb (105.688 kg)  06/23/11 233 lb (105.688 kg)    Usual Body Weight: 233-237#  % Usual Body Weight: 96%  BMI:  Body mass index is 27.2 kg/(m^2).overweight  Estimated Nutritional Needs: Kcal: 2500-2800  Protein: 110-129 gr Fluid: 1000 ml plus output    Skin: dialysis fistula right arm  Diet Order: Renal 80/90-09-29-1198 ml daily  EDUCATION NEEDS: -Education needs addressed   Intake/Output Summary (Last 24 hours) at 08/13/13 1051 Last data filed at 08/12/13 1425  Gross per 24 hour  Intake      0 ml  Output   2500 ml  Net  -2500 ml    Last BM: 08/12/13   Labs:   Recent Labs Lab 08/09/13 0451 08/10/13 0459 08/11/13 0422 08/12/13 0544 08/13/13 0629  NA 137  --  136 136 139  K 4.5  --  3.8 4.2 4.5  CL 95*  --  94* 93* 97  CO2 25  --  28 25 28   BUN 59*  --  53* 71* 40*  CREATININE 8.66*  --  7.91* 9.73* 6.89*  CALCIUM 10.2  --  10.0 10.0 10.1  PHOS 6.1* 4.8* 4.6  --   --   GLUCOSE 267*  --  174* 161* 99    CBG (last 3)   Recent Labs  08/12/13 1623 08/12/13 2119 08/13/13 0742  GLUCAP 115* 160* 98    Scheduled Meds: . amiodarone  100 mg Oral BID  . atorvastatin  20 mg Oral q1800  . diltiazem  180 mg Oral Daily  . epoetin alfa  12,000 Units Intravenous 3 times weekly  . feeding supplement (NEPRO CARB STEADY)  237 mL Oral BID BM  . feeding  supplement (PRO-STAT SUGAR FREE 64)  30 mL Oral TID WC  . folic acid  1 mg Oral Daily  . insulin aspart  0-9 Units Subcutaneous TID WC  . insulin glargine  10 Units Subcutaneous BID  . levofloxacin  500 mg Oral Q48H  . metoprolol tartrate  25 mg Oral TID  . multivitamin with minerals  1 tablet Oral Daily  . pantoprazole  40 mg Oral BID  . piperacillin-tazobactam (ZOSYN)  IV  2.25 g Intravenous Q8H  . sevelamer carbonate  1,600 mg Oral TID WC  . sodium chloride  10-40 mL Intracatheter Q12H  . sodium chloride  3 mL Intravenous Q12H  . traZODone  50 mg Oral QHS  . vancomycin  1,000 mg Intravenous Q T,Th,Sa-HD  . Warfarin - Pharmacist Dosing Inpatient   Does not apply Q24H    Continuous Infusions:    Past Medical History  Diagnosis Date  . UGI bleed 02/12/11    on anticoagulation; EGD w/snare polypectomy-multiple polypoid lesions antrum(benign), Chronic active  gastritis (NEGATIVE H pylori)  . Anemia, chronic disease   . Sepsis due to enterococcus 02/11/11  . Atrial fibrillation     coumadin  . CAD (coronary artery disease)     s/p CABGx4  . Cardiomyopathy     40-45% EF 6/12  . Diabetes mellitus   . Hypertension   . Hyperlipemia   . Gout   . S/P patent foramen ovale closure   . History of nuclear stress test 11/2011    negative myoview  . End stage kidney disease     T/TH/SAT dialyis, Dr Fausto Skillern  . Gout   . History of nuclear stress test 07/04/2010    dipyridamole; mod fixed inferolateral defect; non-diagnostic for ischemia; low risk scan   . Hyperbilirubinemia 08/10/2013    Past Surgical History  Procedure Laterality Date  . Cardiac catheterization  12/31/2006    severe diffuse 3 vessel CAD, severe 4+ MR (Dr. Evlyn Courier)  . Mitral valve replacement  01/05/2007    bioprosthetic 31mm Edwards precordial tissue (Dr. Tyrone Sage)  . Coronary artery bypass graft  01/05/2007    x4; LIMA-LAD, SVG-OM, seq. SVG-PDA, PLA-RCA  . Colonoscopy  06/23/2011    Dr. Darrick Penna: multiple sessile and pedunculated polyps, moderate diverticulosis, internal hemorrhoids, +ADENOMATOUS POLYPS, consider genetic testing, surveillance in October 2015   . Esophagogastroduodenoscopy  02/12/2011    CHRONIC ACTIVE GASTRITIS, NO H. pylori  . Transthoracic echocardiogram  11/2011    EF 25% w/ mod dilatation of LV & mod LVH; LA severely dilated; mod-severe dilation of RV with mod-severe decrease in RV function; mild MR & TR  . Cardioversion  04/19/2007    Dr. Jonette Eva  . Transthoracic echocardiogram  02/14/2011    EF 40-45%, mod conc LVH; ventricular septum with motion showing abnormal function, dyssynergy, paradox; mild AS; mild-mod MR stenosis; LA severely dilated; aptrial septum bowed from L to R with increased LA pressure   . Amputation Right 07/14/2013    Procedure: AMPUTATION DIGIT;  Surgeon: Dalia Heading, MD;  Location: [facility];  Service: General;  Laterality: Right;  .  Amputation Right 08/08/2013    Procedure: AMPUTATION BELOW KNEE;  Surgeon: Dalia Heading, MD;  Location: [facility];  Service: General;  Laterality: Right;    Royann Shivers MS,RD,CSG,LDN Office: [phone] Pager: [phone]

## 2013-08-13 NOTE — Progress Notes (Signed)
[name] [name]  MRN: [id number]  DOB/AGE: [birth year] 64 y.o.  Primary Care Physician:FUSCO,LAWRENCE J., MD  Admit date: 08/05/2013  Chief Complaint:  Chief Complaint  Patient presents with  . Hypotension    S-Pt presented on  08/05/2013 with  Chief Complaint  Patient presents with  . Hypotension  .    Pt today offers no new complaints .    Pt had questions about whether he will go to [facility] or [facility] for rehab.   Meds . amiodarone  100 mg Oral BID  . atorvastatin  20 mg Oral q1800  . diltiazem  180 mg Oral Daily  . epoetin alfa  12,000 Units Intravenous 3 times weekly  . feeding supplement (NEPRO CARB STEADY)  237 mL Oral BID BM  . feeding supplement (PRO-STAT SUGAR FREE 64)  30 mL Oral TID WC  . folic acid  1 mg Oral Daily  . insulin aspart  0-9 Units Subcutaneous TID WC  . insulin glargine  10 Units Subcutaneous BID  . levofloxacin  500 mg Oral Q48H  . metoprolol tartrate  25 mg Oral TID  . multivitamin with minerals  1 tablet Oral Daily  . pantoprazole  40 mg Oral BID  . piperacillin-tazobactam (ZOSYN)  IV  2.25 g Intravenous Q8H  . sevelamer carbonate  1,600 mg Oral TID WC  . sodium chloride  10-40 mL Intracatheter Q12H  . sodium chloride  3 mL Intravenous Q12H  . traZODone  50 mg Oral QHS  . vancomycin  1,000 mg Intravenous Q T,Th,Sa-HD  . Warfarin - Pharmacist Dosing Inpatient   Does not apply Q24H      Physical Exam: Vital signs in last 24 hours: Temp:  [97.6 F (36.4 C)-97.9 F (36.6 C)] 97.6 F (36.4 C) (12/17 0548) Pulse Rate:  [71-103] 98 (12/17 0548) Resp:  [18-20] 20 (12/17 0548) BP: (102-136)/(65-89) 136/89 mmHg (12/17 0548) SpO2:  [93 %-98 %] 98 % (12/17 0548) Weight:  [215 lb 6.2 oz (97.7 kg)-223 lb 5.2 oz (101.3 kg)] 223 lb 5.2 oz (101.3 kg) (12/17 0548) Weight change:  Last BM Date: 08/12/13  Intake/Output from previous day: 12/16 0701 - 12/17 0700 In: 180 [P.O.:180] Out: 2500      Physical Exam: General- pt is awake,alert,  oriented to time place and person Resp- No acute REsp distress, CTA B/L NO Rhonchi CVS- S1S2 Irregular in rate and rhythm GIT- BS+, soft, NT, ND EXT- NO LE Edema, Right toe amputated, Wound being placed  Access -Right AVF +  Lab Results: CBC  Recent Labs  08/12/13 0544 08/13/13 0629  WBC 16.0* 14.7*  HGB 10.3* 11.2*  HCT 32.1* 34.9*  PLT 298 293    BMET  Recent Labs  08/12/13 0544 08/13/13 0629  NA 136 139  K 4.2 4.5  CL 93* 97  CO2 25 28  GLUCOSE 161* 99  BUN 71* 40*  CREATININE 9.73* 6.89*  CALCIUM 10.0 10.1    MICRO Recent Results (from the past 240 hour(s))  CULTURE, BLOOD (ROUTINE X 2)     Status: None   Collection Time    08/05/13  3:04 PM      Result Value Range Status   Specimen Description BLOOD LEFT HAND   Final   Special Requests BOTTLES DRAWN AEROBIC AND ANAEROBIC 6CC   Final   Culture NO GROWTH 5 DAYS   Final   Report Status 08/10/2013 FINAL   Final  CULTURE, BLOOD (ROUTINE X 2)     Status: None  Collection Time    08/05/13  3:13 PM      Result Value Range Status   Specimen Description BLOOD LEFT ARM   Final   Special Requests BOTTLES DRAWN AEROBIC ONLY 4CC   Final   Culture NO GROWTH 5 DAYS   Final   Report Status 08/10/2013 FINAL   Final  SURGICAL PCR SCREEN     Status: None   Collection Time    08/08/13  9:02 AM      Result Value Range Status   MRSA, PCR NEGATIVE  NEGATIVE Final   Staphylococcus aureus NEGATIVE  NEGATIVE Final   Comment:            The Xpert SA Assay (FDA     approved for NASAL specimens     in patients over 31 years of age),     is one component of     a comprehensive surveillance     program.  Test performance has     been validated by [employer] for patients greater     than or equal to 57 year old.     It is not intended     to diagnose infection nor to     guide or monitor treatment.      Lab Results  Component Value Date   CALCIUM 10.1 08/13/2013   PHOS 4.6 08/11/2013         Impression: 1)Renal                  ESRD on HD                ON TTS schedule                Pt  Was Dialyzed yesterday                 No need of HD today  2)HTN  BP on lower side ( stable) Target Organ damage  CKD CAD CHF Medication- On Beta blockers and Calcium channel blockers    3)Anemia HGb at goal (9--11) On Epo  4)CKD Mineral-Bone Disorder Phosphorus at goal. Calcium at goal.  5)ID-admitted with Osteomyelitis S/p amputation  6)Electrolytes Normokalemic NOrmonatremic   7)Acid base Co2 at goal  8) Afib- On Coumadin ( being held as INR high)and Amiodarone     Plan:   Will continue curent tx  In case pt is dc/ed will arrange for vanco as outpt in Hd unit -10 doses in total ( 3 weeks)    Ed Rayson S 08/13/2013, 8:46 AM

## 2013-08-13 NOTE — Progress Notes (Signed)
ANTICOAGULATION CONSULT NOTE -  Pharmacy Consult for Warfarin Indication: atrial fibrillation  Allergies  Allergen Reactions  . Bacitracin Other (See Comments)    unknown  . Norvasc [Amlodipine Besylate] Other (See Comments)    unknown   Patient Measurements: Height: 6\' 4"  (193 cm) Weight: 223 lb 5.2 oz (101.3 kg) IBW/kg (Calculated) : 86.8  Vital Signs: Temp: 97.6 F (36.4 C) (12/17 0548) Temp src: Oral (12/17 0548) BP: 136/89 mmHg (12/17 0548) Pulse Rate: 98 (12/17 0548)  Labs:  Recent Labs  08/11/13 0422 08/12/13 0544 08/13/13 0629  HGB 9.7* 10.3* 11.2*  HCT 30.2* 32.1* 34.9*  PLT 266 298 293  LABPROT 34.4* 43.9* 41.3*  INR 3.58* 4.92* 4.55*  CREATININE 7.91* 9.73* 6.89*   Estimated Creatinine Clearance: 13.3 ml/min (by C-G formula based on Cr of 6.89).  Medical History: Past Medical History  Diagnosis Date  . UGI bleed 02/12/11    on anticoagulation; EGD w/snare polypectomy-multiple polypoid lesions antrum(benign), Chronic active gastritis (NEGATIVE H pylori)  . Anemia, chronic disease   . Sepsis due to enterococcus 02/11/11  . Atrial fibrillation     coumadin  . CAD (coronary artery disease)     s/p CABGx4  . Cardiomyopathy     40-45% EF 6/12  . Diabetes mellitus   . Hypertension   . Hyperlipemia   . Gout   . S/P patent foramen ovale closure   . History of nuclear stress test 11/2011    negative myoview  . End stage kidney disease     T/TH/SAT dialyis, Dr Fausto Skillern  . Gout   . History of nuclear stress test 07/04/2010    dipyridamole; mod fixed inferolateral defect; non-diagnostic for ischemia; low risk scan   . Hyperbilirubinemia 08/10/2013   Medications:  Prescriptions prior to admission  Medication Sig Dispense Refill  . allopurinol (ZYLOPRIM) 100 MG tablet Take 100 mg by mouth daily.       Marland Kitchen amiodarone (PACERONE) 200 MG tablet Take 100 mg by mouth daily.       Marland Kitchen aspirin 81 MG tablet Take 81 mg by mouth daily.        Marland Kitchen atorvastatin (LIPITOR)  20 MG tablet Take 1 tablet (20 mg total) by mouth daily.  30 tablet  11  . B Complex-C-Zn-Folic Acid (DIALYVITE/ZINC PO) Take by mouth 3 (three) times a week. Patient has dialysis on Tuesdays, Thursdays, and Saturdays      . diltiazem (CARDIZEM CD) 180 MG 24 hr capsule Take 1 capsule (180 mg total) by mouth daily.  30 capsule  1  . folic acid (FOLVITE) 1 MG tablet Take 1 mg by mouth daily.        Marland Kitchen glimepiride (AMARYL) 4 MG tablet Take 4 mg by mouth 2 (two) times daily.        . Insulin Glargine (LANTUS SOLOSTAR) 100 UNIT/ML SOPN Inject 38 Units into the skin at bedtime.      . metoprolol (LOPRESSOR) 100 MG tablet Take 1 tablet (100 mg total) by mouth 2 (two) times daily.  60 tablet  1  . Multiple Vitamin (MULTIVITAMIN) capsule Take 1 capsule by mouth daily.      . pantoprazole (PROTONIX) 40 MG tablet Take 40 mg by mouth 2 (two) times daily.       Marland Kitchen RENVELA 800 MG tablet Take 800 mg by mouth 3 (three) times daily with meals.       . torsemide (DEMADEX) 20 MG tablet Take 20 mg by mouth 2 (two) times  daily.      . traZODone (DESYREL) 50 MG tablet Take 50 mg by mouth at bedtime.      Marland Kitchen warfarin (COUMADIN) 2.5 MG tablet Take 1.25 mg by mouth daily.       Assessment: 62yo male admitted for osteomyelitis and suspected sepsis.  Pt is on Coumadin PTA for h/o afib. S/P BKA this admission. INR was 1.71 on admission.   Coumadin was restarted after surgery & INR is now elevated > 4.  No bleeding noted.   Goal of Therapy:  INR 2-3   Plan:  Hold warfarin today Daily PT/INR  Valrie Hart A 08/13/2013,9:08 AM

## 2013-08-13 NOTE — Progress Notes (Signed)
Right lower extremity below knee amputation site healing well. No evidence of infection. Given his multiple medical problems, I would continue IV antibiotics for a total of 2-3 weeks. He could receive vancomycin while on dialysis.

## 2013-08-13 NOTE — Progress Notes (Signed)
[facility] re[facility] admissions - I spoke with patient this am.  He tells me he prefers [place] [facility] rather than inpatient rehab here in [place].  I have let the case manager know of patient preference.  Call me for questions.  #[phone]

## 2013-08-13 NOTE — Clinical Social Work Note (Signed)
CSW discussed d/c plan with pt this morning. He does not want to go to [facility] and requests [facility]. Bed available at [facility] which pt accepts. Facility can provide transport this afternoon. D/C summary and FL2 faxed. Aware of dialysis. Pt will get IV antibiotics at dialysis.   Derenda Fennel, [place] [phone]

## 2013-08-13 NOTE — Discharge Summary (Signed)
Physician Discharge Summary  [name] [name] ZOX:[id number] DOB: 11/04/1950 DOA: 08/05/2013  PCP: Cassell Smiles., MD  Admit date: 08/05/2013 Discharge date: 08/13/2013  Time spent: 40 minutes  Recommendations for Outpatient Follow-up:  1. Coumadin held-check in in 2-3 days.  Rec starting back at dose of 2 mg when below 2.5 INR 2. Contin Vancomycin for 10 more doses 3. Continue Levaquin for 5 more doses  Discharge Diagnoses:  Principal Problem:   Sepsis Active Problems:   Atrial fibrillation   [place] term (current) use of anticoagulants   CAD (coronary artery disease)   ESRD (end stage renal disease) on dialysis   DM type 2 causing ESRD   Osteomyelitis of right foot   Hypotension   Hypoglycemia associated with diabetes   Atherosclerosis of native arteries of the extremities with gangrene   Anemia of chronic disease   Hyperbilirubinemia   HCAP (healthcare-associated pneumonia)   Discharge Condition: Stable  Diet recommendation:  Renal panel  Filed Weights   08/12/13 1015 08/12/13 1445 08/13/13 0548  Weight: 100.4 kg (221 lb 5.5 oz) 97.7 kg (215 lb 6.2 oz) 101.3 kg (223 lb 5.2 oz)    History of present illness:  62 year old African American man with a past medical history significant for end-stage renal disease on hemodialysis Tuesday, Thursday, Saturday, atrial fibrillation maintained on chronic anticoagulation with Coumadin, prior osteomyelitis of the right big toe status post amputation on 07/14/2013, currently with a wound VAC and receiving vancomycin and ceftaz edema with dialysis, diabetes mellitus. He presents to the hospital today with a blood pressure of 90/72 and noticed immediately following dialysis. They did administer 400 cc of saline and brought him to the hospital for evaluation.  Found ultimately to have Osteo and GEn surg saw him   Hospital Course:  1. Osteomyelitis of the first metatarsal with associated cellulitis and gangrene of the right foot secondary to  peripheral vascular disease. Status post right BKA, by Dr. Lovell Sheehan, postoperative day #4.  The right stump operative site is healing well. The patient is afebrile and his white blood cell count has improved overall, but increased a little since yesterday. Per Dr. Lovell Sheehan, the patient's sutures and staples need to stay in for 3 weeks. The patient should be scheduled for a followup with Dr. Lovell Sheehan.  2. 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 Previously treated with a wound VAC on the right foot per Dr. Lovell Sheehan.  3. Sepsis and lactic acidosis secondary to #1.  Resolved with supportive treatment and IV antibiotics and BKA..  4. Query new small right lower lobe pneumonia. He is not particularly hypoxic, but he does have a few wheezes. We'll add oral Levaquin empirically today. Per pharmacy, it can be dosed 500 mg every other day following dialysis. Would treat for 5 days 5. Chronic atrial fibrillation with rapid ventricular response. For the first few days of the hospitalization, the patient's heart rate ranged from 110-120. Metoprolol and diltiazem were temporally withheld because of his low blood pressures, but amiodarone was continued at 100 mg daily. Diltiazem has been since restarted since his blood pressure improved. The dosing of metoprolol is not quite back to the home dose of 100 mg twice a day, but he is receiving 25 mg 3 times a day-continue this dose  May need to d/c this dosing with dialysis. Amiodarone was increased to twice a day on 12/13. 6. Chronic anticoagulation with warfarin secondary to atrial fibrillation/coagulopathy-supratherapeutic INR. Coumadin was withheld prior to and immediately following the operation. It  was started a couple days ago. His INR has quickly become super therapeutic. There is no obvious gross bleeding. Coumadin is being held per pharmacy. His INR will be monitored daily--Recheck in 4 days- held on d/c 7. CAD/ischemic cardiomyopathy. Aspirin is on hold for  now to reduce risk of bleeding. Torsemide is on hold because of soft blood pressures, but he is receiving hemodialysis. Metoprolol was restarted at a reduced dosing as stated above.  8. Diabetes mellitus. The patient had one episode of hypoglycemia on 12/10 which was treated with an amp of D50 and gentle dextrose infusion and decrease in insulin. Now resolved. His capillary blood glucose is waxing and waning but overall improved. Lantus restarted cautiously. We'll continue to make adjustments in insulin dosing accordingly.  9. End-stage renal disease. He is being dialyzed per nephrology. Nephrology has been following the patient. The patient schedule is Tuesday-Thursday-Saturday.  10. Anemia of critical illness and chronic disease. The patient's hemoglobin trended downward to 8.0 preop. He was transfused 2 units of packed red blood cells prior to surgery with anticipation that he would have blood loss. He was also transfused 1 unit following the operation. His hemoglobin improved to 9.5 and has been stable ranging from 9.5-10.3. He is also receiving Epogen per nephrology.  11. Hyperbilirubinemia. Per chart review, this appears to be chronic. We'll continue to follow.    Consultants:  General surgeon, Dr. Lovell Sheehan  Nephrology  Procedures:  Right BKA 08/08/2013  Hemodialysis 08/06/2013>>  Right femoral central line 08/07/2013 per Dr. Lovell Sheehan. Antibiotics:  Levaquin 08/13/2103>> 12/17 IV vancomycin 08/05/2013>> needs 10 more  Doses, organized by Dr. Wolfgang Phoenix, Nephrology Zosyn 08/07/2013>> 12/17 IV ceftaz 08/05/2013>> 08/07/2013   Discharge Exam: Filed Vitals:   08/13/13 0548  BP: 136/89  Pulse: 98  Temp: 97.6 F (36.4 C)  Resp: 20    Aelrt pelasant famiyl at bedside  General: EOMI, NCAT no pallor no icterus Cardiovascular: S1-S2 no murmur or gallop Respiratory: Clear BKA wound to left foot not examined (had been examined by general surgeons this a.m.]  Discharge  Instructions  Discharge Orders   Future Orders Complete By Expires   Diet - low sodium heart healthy  As directed    Discharge instructions  As directed    Comments:     Needs ABX for 9 more doses Needs aggressive therapy   Discharge wound care:  As directed    Comments:     Wraps with Kerlix an mepalex   Increase activity slowly  As directed        Medication List    STOP taking these medications       torsemide 20 MG tablet  Commonly known as:  DEMADEX      TAKE these medications       allopurinol 100 MG tablet  Commonly known as:  ZYLOPRIM  Take 100 mg by mouth daily.     amiodarone 200 MG tablet  Commonly known as:  PACERONE  Take 0.5 tablets (100 mg total) by mouth daily.     aspirin 81 MG tablet  Take 81 mg by mouth daily.     atorvastatin 20 MG tablet  Commonly known as:  LIPITOR  Take 1 tablet (20 mg total) by mouth daily.     DIALYVITE/ZINC PO  Take by mouth 3 (three) times a week. Patient has dialysis on Tuesdays, Thursdays, and Saturdays     diltiazem 180 MG 24 hr capsule  Commonly known as:  CARDIZEM CD  Take 1 capsule (  180 mg total) by mouth daily.     folic acid 1 MG tablet  Commonly known as:  FOLVITE  Take 1 mg by mouth daily.     glimepiride 4 MG tablet  Commonly known as:  AMARYL  Take 4 mg by mouth 2 (two) times daily.     Insulin Glargine 100 UNIT/ML Sopn  Commonly known as:  LANTUS SOLOSTAR  Inject 10 Units into the skin at bedtime.     metoprolol tartrate 25 MG tablet  Commonly known as:  LOPRESSOR  Take 1 tablet (25 mg total) by mouth 3 (three) times daily.     multivitamin capsule  Take 1 capsule by mouth daily.     pantoprazole 40 MG tablet  Commonly known as:  PROTONIX  Take 40 mg by mouth 2 (two) times daily.     RENVELA 800 MG tablet  Generic drug:  sevelamer carbonate  Take 800 mg by mouth 3 (three) times daily with meals.     traZODone 50 MG tablet  Commonly known as:  DESYREL  Take 50 mg by mouth at bedtime.      vancomycin 1 GM/200ML Soln  Commonly known as:  VANCOCIN  Inject 200 mLs (1,000 mg total) into the vein Every Tuesday,Thursday,and Saturday with dialysis.     warfarin 2.5 MG tablet  Commonly known as:  COUMADIN  Take 1.25 mg by mouth daily.       Allergies  Allergen Reactions  . Bacitracin Other (See Comments)    unknown  . Norvasc [Amlodipine Besylate] Other (See Comments)    unknown       Follow-up Information   Follow up with Franky Macho A, MD. Schedule an appointment as soon as possible for a visit in 3 weeks.   Specialty:  General Surgery   Contact information:   [address] [place] [address] [phone]        The results of significant diagnostics from this hospitalization (including imaging, microbiology, ancillary and laboratory) are listed below for reference.    Significant Diagnostic Studies: Dg Chest Port 1 View  08/12/2013   CLINICAL DATA:  Elevated white blood cell count.  EXAM: PORTABLE CHEST - 1 VIEW  COMPARISON:  12/05/2011  FINDINGS: Cardiomegaly with vascular congestion. Airspace opacity in the right lower lobe with small right pleural effusion. This is concerning for pneumonia. There may be a component of mild interstitial edema. No effusion on the left or confluent airspace opacity on the left. No acute bony abnormality.  IMPRESSION: Cardiomegaly with vascular congestion, question early interstitial edema.  Focal airspace opacity in the right lung base concerning for pneumonia.  Small right effusion.   Electronically Signed   By: Charlett Nose M.D.   On: 08/12/2013 09:42   Dg Foot 2 Views Right  08/05/2013   CLINICAL DATA:  History of ulcer involving the 1st digit findings performed evaluate for changes secondary to osteomyelitis  EXAM: RIGHT FOOT - 2 VIEW  COMPARISON:  Great toe evaluation 07/07/2013  FINDINGS: There is subcutaneous emphysema and ulceration involving the soft tissues overlying the head of the 1st metatarsal. Cortical  destruction is identified along the medial and lateral periphery of the head of the 1st metatarsal. A drainage catheter likely representing a wound VAC is appreciated within the ulcer. Atherosclerotic calcification appreciated. There is osteopenia which may represent hyperemia considering the findings. There is no evidence of acute fracture nor dislocation.  IMPRESSION: Findings consistent with osteomyelitis involving the head of the 1st metatarsal until  proven otherwise. Dr. Effie Shy of the [facility] emergency department was informed of these findings at the time of initial interpretation via telephone conversation.   Electronically Signed   By: Salome Holmes M.D.   On: 08/05/2013 15:46    Microbiology: Recent Results (from the past 240 hour(s))  CULTURE, BLOOD (ROUTINE X 2)     Status: None   Collection Time    08/05/13  3:04 PM      Result Value Range Status   Specimen Description BLOOD LEFT HAND   Final   Special Requests BOTTLES DRAWN AEROBIC AND ANAEROBIC 6CC   Final   Culture NO GROWTH 5 DAYS   Final   Report Status 08/10/2013 FINAL   Final  CULTURE, BLOOD (ROUTINE X 2)     Status: None   Collection Time    08/05/13  3:13 PM      Result Value Range Status   Specimen Description BLOOD LEFT ARM   Final   Special Requests BOTTLES DRAWN AEROBIC ONLY 4CC   Final   Culture NO GROWTH 5 DAYS   Final   Report Status 08/10/2013 FINAL   Final  SURGICAL PCR SCREEN     Status: None   Collection Time    08/08/13  9:02 AM      Result Value Range Status   MRSA, PCR NEGATIVE  NEGATIVE Final   Staphylococcus aureus NEGATIVE  NEGATIVE Final   Comment:            The Xpert SA Assay (FDA     approved for NASAL specimens     in patients over 69 years of age),     is one component of     a comprehensive surveillance     program.  Test performance has     been validated by [employer] for patients greater     than or equal to 30 year old.     It is not intended     to diagnose  infection nor to     guide or monitor treatment.     Labs: Basic Metabolic Panel:  Recent Labs Lab 08/07/13 0459 08/08/13 0418 08/09/13 0451 08/10/13 0459 08/11/13 0422 08/12/13 0544 08/13/13 0629  NA 134* 136 137  --  136 136 139  K 4.3 4.0 4.5  --  3.8 4.2 4.5  CL 90* 92* 95*  --  94* 93* 97  CO2 22 28 25   --  28 25 28   GLUCOSE 172* 188* 267*  --  174* 161* 99  BUN 48* 44* 59*  --  53* 71* 40*  CREATININE 9.23* 6.95* 8.66*  --  7.91* 9.73* 6.89*  CALCIUM 10.4 10.2 10.2  --  10.0 10.0 10.1  PHOS 7.2* 5.1* 6.1* 4.8* 4.6  --   --    Liver Function Tests:  Recent Labs Lab 08/07/13 0459 08/08/13 0418 08/09/13 0451 08/09/13 0500  AST  --   --   --  34  ALT  --   --   --  14  ALKPHOS  --   --   --  88  BILITOT  --   --   --  3.4*  PROT  --   --   --  6.7  ALBUMIN 2.2* 2.0* 1.9* 1.9*   No results found for this basename: LIPASE, AMYLASE,  in the last 168 hours No results found for this basename: AMMONIA,  in the last 168 hours CBC:  Recent Labs Lab 08/09/13 0451 08/10/13 0459 08/11/13 0422 08/12/13 0544 08/13/13 0629  WBC 21.3* 16.7* 15.7* 16.0* 14.7*  HGB 9.7* 9.5* 9.7* 10.3* 11.2*  HCT 29.4* 29.6* 30.2* 32.1* 34.9*  MCV 89.9 92.2 92.1 92.8 94.1  PLT 225 252 266 298 293   Cardiac Enzymes: No results found for this basename: CKTOTAL, CKMB, CKMBINDEX, TROPONINI,  in the last 168 hours BNP: BNP (last 3 results) No results found for this basename: PROBNP,  in the last 8760 hours CBG:  Recent Labs Lab 08/12/13 0708 08/12/13 1124 08/12/13 1623 08/12/13 2119 08/13/13 0742  GLUCAP 134* 154* 115* 160* 98       Signed:  Mignonne Afonso, JAI-GURMUKH  [facility] 08/13/2013, 9:53 AM

## 2013-08-13 NOTE — Progress Notes (Signed)
Removed Pt's femoral line. Dressed with pressure dressing and petroleum impregnated gauze. Pressure was held for 10 minutes. Pt tolerated procedure well. Will continue to monitor.

## 2013-08-13 NOTE — Clinical Social Work Placement (Signed)
Clinical Social Work Department CLINICAL SOCIAL WORK PLACEMENT NOTE 08/13/2013  Patient:  [name] [name], [name] [name]  Account Number:  [id number] Admit date:  08/05/2013  Clinical Social Worker:  Santa Genera, CLINICAL SOCIAL WORKER  Date/time:  08/11/2013 12:30 PM  Clinical Social Work is seeking post-discharge placement for this patient at the following level of care:   SKILLED NURSING   (*CSW will update this form in Epic as items are completed)   08/11/2013  Patient/family provided with [facility] Department of Clinical Social Work's list of facilities offering this level of care within the geographic area requested by the patient (or if unable, by the patient's family).  08/11/2013  Patient/family informed of their freedom to choose among providers that offer the needed level of care, that participate in Medicare, Medicaid or managed care program needed by the patient, have an available bed and are willing to accept the patient.  08/11/2013  Patient/family informed of [facility]' ownership interest in [facility], as well as of the fact that they are under no obligation to receive care at this facility.  PASARR submitted to EDS on 08/11/2013 PASARR number received from EDS on 08/11/2013  FL2 transmitted to all facilities in geographic area requested by pt/family on  08/11/2013 FL2 transmitted to all facilities within larger geographic area on   Patient informed that his/her managed care company has contracts with or will negotiate with  certain facilities, including the following:     Patient/family informed of bed offers received:  08/13/2013 Patient chooses bed [facility] Physician recommends and patient chooses bed at  [facility]  Patient to be transferred to [facility] on  08/13/2013 Patient to be transferred to facility by Facility  The following physician request were entered in Epic:   Additional Comments: Patient wants SNF as  back up to inpatient rehab.  Derenda Fennel, [place] [phone]

## 2013-08-13 NOTE — Progress Notes (Signed)
Pt is to be discharged to [facility] in [place] today. Pt is in NAD, central line in out, all paperwork has been reviewed/discussed with patient, and there are no questions/concerns at this time. Report has been called to Marisue Ivan, Charity fundraiser. Assessment is unchanged from this morning. Pt is to be accompanied downstairs by staff and [facility] transportation by wheelchair. Will continue to monitor.

## 2013-09-17 ENCOUNTER — Encounter (HOSPITAL_COMMUNITY): Payer: Self-pay | Admitting: Emergency Medicine

## 2013-09-17 ENCOUNTER — Emergency Department (HOSPITAL_COMMUNITY): Payer: Medicare Other

## 2013-09-17 ENCOUNTER — Inpatient Hospital Stay (HOSPITAL_COMMUNITY)
Admission: EM | Admit: 2013-09-17 | Discharge: 2013-09-24 | DRG: 474 | Disposition: A | Discharge status: Home Health | Payer: Medicare Other | Attending: Internal Medicine | Admitting: Internal Medicine

## 2013-09-17 DIAGNOSIS — D62 Acute posthemorrhagic anemia: Secondary | ICD-10-CM | POA: Diagnosis present

## 2013-09-17 DIAGNOSIS — E785 Hyperlipidemia, unspecified: Secondary | ICD-10-CM | POA: Diagnosis present

## 2013-09-17 DIAGNOSIS — I120 Hypertensive chronic kidney disease with stage 5 chronic kidney disease or end stage renal disease: Secondary | ICD-10-CM | POA: Diagnosis present

## 2013-09-17 DIAGNOSIS — D638 Anemia in other chronic diseases classified elsewhere: Secondary | ICD-10-CM | POA: Diagnosis present

## 2013-09-17 DIAGNOSIS — Z952 Presence of prosthetic heart valve: Secondary | ICD-10-CM

## 2013-09-17 DIAGNOSIS — E11649 Type 2 diabetes mellitus with hypoglycemia without coma: Secondary | ICD-10-CM

## 2013-09-17 DIAGNOSIS — K5900 Constipation, unspecified: Secondary | ICD-10-CM

## 2013-09-17 DIAGNOSIS — I509 Heart failure, unspecified: Secondary | ICD-10-CM | POA: Diagnosis present

## 2013-09-17 DIAGNOSIS — Z87891 Personal history of nicotine dependence: Secondary | ICD-10-CM

## 2013-09-17 DIAGNOSIS — T8133XA Disruption of traumatic injury wound repair, initial encounter: Secondary | ICD-10-CM | POA: Diagnosis present

## 2013-09-17 DIAGNOSIS — E7800 Pure hypercholesterolemia, unspecified: Secondary | ICD-10-CM | POA: Diagnosis present

## 2013-09-17 DIAGNOSIS — I428 Other cardiomyopathies: Secondary | ICD-10-CM | POA: Diagnosis present

## 2013-09-17 DIAGNOSIS — A419 Sepsis, unspecified organism: Secondary | ICD-10-CM

## 2013-09-17 DIAGNOSIS — E119 Type 2 diabetes mellitus without complications: Secondary | ICD-10-CM | POA: Diagnosis present

## 2013-09-17 DIAGNOSIS — Z833 Family history of diabetes mellitus: Secondary | ICD-10-CM

## 2013-09-17 DIAGNOSIS — A4902 Methicillin resistant Staphylococcus aureus infection, unspecified site: Secondary | ICD-10-CM | POA: Diagnosis present

## 2013-09-17 DIAGNOSIS — Z1211 Encounter for screening for malignant neoplasm of colon: Secondary | ICD-10-CM

## 2013-09-17 DIAGNOSIS — Z992 Dependence on renal dialysis: Secondary | ICD-10-CM

## 2013-09-17 DIAGNOSIS — T8789 Other complications of amputation stump: Principal | ICD-10-CM | POA: Diagnosis present

## 2013-09-17 DIAGNOSIS — I5022 Chronic systolic (congestive) heart failure: Secondary | ICD-10-CM | POA: Diagnosis present

## 2013-09-17 DIAGNOSIS — S88119A Complete traumatic amputation at level between knee and ankle, unspecified lower leg, initial encounter: Secondary | ICD-10-CM

## 2013-09-17 DIAGNOSIS — Z794 Long term (current) use of insulin: Secondary | ICD-10-CM

## 2013-09-17 DIAGNOSIS — I70269 Atherosclerosis of native arteries of extremities with gangrene, unspecified extremity: Secondary | ICD-10-CM

## 2013-09-17 DIAGNOSIS — Z7982 Long term (current) use of aspirin: Secondary | ICD-10-CM

## 2013-09-17 DIAGNOSIS — M109 Gout, unspecified: Secondary | ICD-10-CM | POA: Diagnosis present

## 2013-09-17 DIAGNOSIS — M869 Osteomyelitis, unspecified: Secondary | ICD-10-CM

## 2013-09-17 DIAGNOSIS — R079 Chest pain, unspecified: Secondary | ICD-10-CM

## 2013-09-17 DIAGNOSIS — J189 Pneumonia, unspecified organism: Secondary | ICD-10-CM

## 2013-09-17 DIAGNOSIS — D126 Benign neoplasm of colon, unspecified: Secondary | ICD-10-CM

## 2013-09-17 DIAGNOSIS — Y836 Removal of other organ (partial) (total) as the cause of abnormal reaction of the patient, or of later complication, without mention of misadventure at the time of the procedure: Secondary | ICD-10-CM | POA: Diagnosis present

## 2013-09-17 DIAGNOSIS — I2510 Atherosclerotic heart disease of native coronary artery without angina pectoris: Secondary | ICD-10-CM | POA: Diagnosis present

## 2013-09-17 DIAGNOSIS — K219 Gastro-esophageal reflux disease without esophagitis: Secondary | ICD-10-CM

## 2013-09-17 DIAGNOSIS — I739 Peripheral vascular disease, unspecified: Secondary | ICD-10-CM | POA: Diagnosis present

## 2013-09-17 DIAGNOSIS — N186 End stage renal disease: Secondary | ICD-10-CM | POA: Diagnosis present

## 2013-09-17 DIAGNOSIS — I4891 Unspecified atrial fibrillation: Secondary | ICD-10-CM | POA: Diagnosis present

## 2013-09-17 DIAGNOSIS — Z7901 Long term (current) use of anticoagulants: Secondary | ICD-10-CM

## 2013-09-17 DIAGNOSIS — I959 Hypotension, unspecified: Secondary | ICD-10-CM

## 2013-09-17 DIAGNOSIS — R1310 Dysphagia, unspecified: Secondary | ICD-10-CM

## 2013-09-17 DIAGNOSIS — Z951 Presence of aortocoronary bypass graft: Secondary | ICD-10-CM

## 2013-09-17 DIAGNOSIS — E1129 Type 2 diabetes mellitus with other diabetic kidney complication: Secondary | ICD-10-CM

## 2013-09-17 DIAGNOSIS — E1122 Type 2 diabetes mellitus with diabetic chronic kidney disease: Secondary | ICD-10-CM | POA: Diagnosis present

## 2013-09-17 DIAGNOSIS — K922 Gastrointestinal hemorrhage, unspecified: Secondary | ICD-10-CM

## 2013-09-17 DIAGNOSIS — W19XXXA Unspecified fall, initial encounter: Secondary | ICD-10-CM | POA: Diagnosis present

## 2013-09-17 DIAGNOSIS — M86171 Other acute osteomyelitis, right ankle and foot: Secondary | ICD-10-CM

## 2013-09-17 LAB — GLUCOSE, CAPILLARY
Glucose-Capillary: 98 mg/dL (ref 70–99)
Glucose-Capillary: 117 mg/dL — ABNORMAL HIGH (ref 70–99)
Glucose-Capillary: 119 mg/dL — ABNORMAL HIGH (ref 70–99)

## 2013-09-17 LAB — BASIC METABOLIC PANEL
BUN: 34 mg/dL — ABNORMAL HIGH (ref 6–23)
CO2: 28 mEq/L (ref 19–32)
Calcium: 10.2 mg/dL (ref 8.4–10.5)
Chloride: 101 mEq/L (ref 96–112)
Creatinine, Ser: 7.11 mg/dL — ABNORMAL HIGH (ref 0.50–1.35)
GFR calc non Af Amer: 7 mL/min — ABNORMAL LOW (ref >90)
GFR, EST AFRICAN AMERICAN: 8 mL/min — AB (ref >90)
Glucose, Bld: 92 mg/dL (ref 70–99)
POTASSIUM: 4.6 meq/L (ref 3.7–5.3)
Sodium: 145 mEq/L (ref 137–147)

## 2013-09-17 LAB — CBC WITH DIFFERENTIAL/PLATELET
BASOS PCT: 0 % (ref 0–1)
Basophils Absolute: 0 10*3/uL (ref 0.0–0.1)
Eosinophils Absolute: 0.4 10*3/uL (ref 0.0–0.7)
Eosinophils Relative: 5 % (ref 0–5)
HCT: 32.4 % — ABNORMAL LOW (ref 39.0–52.0)
HEMOGLOBIN: 10.1 g/dL — AB (ref 13.0–17.0)
Lymphocytes Relative: 10 % — ABNORMAL LOW (ref 12–46)
Lymphs Abs: 0.8 10*3/uL (ref 0.7–4.0)
MCH: 29.4 pg (ref 26.0–34.0)
MCHC: 31.2 g/dL (ref 30.0–36.0)
MCV: 94.5 fL (ref 78.0–100.0)
Monocytes Absolute: 0.6 10*3/uL (ref 0.1–1.0)
Monocytes Relative: 8 % (ref 3–12)
NEUTROS ABS: 5.7 10*3/uL (ref 1.7–7.7)
NEUTROS PCT: 76 % (ref 43–77)
Platelets: 167 10*3/uL (ref 150–400)
RBC: 3.43 MIL/uL — ABNORMAL LOW (ref 4.22–5.81)
RDW: 16.7 % — AB (ref 11.5–15.5)
WBC: 7.5 10*3/uL (ref 4.0–10.5)

## 2013-09-17 LAB — PROTIME-INR
INR: 1.93 — ABNORMAL HIGH (ref 0.00–1.49)
Prothrombin Time: 21.5 seconds — ABNORMAL HIGH (ref 11.6–15.2)

## 2013-09-17 LAB — HEPARIN LEVEL (UNFRACTIONATED): Heparin Unfractionated: <0.1 IU/mL — ABNORMAL LOW (ref 0.30–0.70)

## 2013-09-17 LAB — APTT: APTT: 37 s (ref 24–37)

## 2013-09-17 MED ORDER — TRAZODONE HCL 50 MG PO TABS
50.0000 mg | ORAL_TABLET | Freq: Every day | ORAL | Status: DC
  Administered 2013-09-17 – 2013-09-23 (×7): 50 mg via ORAL
  Filled 2013-09-17 (×7): qty 1

## 2013-09-17 MED ORDER — ASPIRIN EC 81 MG PO TBEC
81.0000 mg | DELAYED_RELEASE_TABLET | Freq: Every day | ORAL | Status: DC
  Administered 2013-09-17 – 2013-09-18 (×2): 81 mg via ORAL
  Filled 2013-09-17 (×2): qty 1

## 2013-09-17 MED ORDER — ONDANSETRON HCL 4 MG PO TABS
4.0000 mg | ORAL_TABLET | Freq: Four times a day (QID) | ORAL | Status: DC | PRN
Start: 2013-09-17 — End: 2013-09-24

## 2013-09-17 MED ORDER — HEPARIN (PORCINE) IN NACL 100-0.45 UNIT/ML-% IJ SOLN
1700.0000 [IU]/h | INTRAMUSCULAR | Status: DC
  Administered 2013-09-17: 1000 [IU]/h via INTRAVENOUS
  Administered 2013-09-18: 1300 [IU]/h via INTRAVENOUS
  Filled 2013-09-17 (×2): qty 250

## 2013-09-17 MED ORDER — SEVELAMER CARBONATE 800 MG PO TABS
800.0000 mg | ORAL_TABLET | Freq: Three times a day (TID) | ORAL | Status: DC
  Administered 2013-09-17 – 2013-09-24 (×17): 800 mg via ORAL
  Filled 2013-09-17 (×17): qty 1

## 2013-09-17 MED ORDER — SODIUM CHLORIDE 0.9 % IJ SOLN
3.0000 mL | INTRAMUSCULAR | Status: DC | PRN
  Administered 2013-09-19 (×2): 3 mL via INTRAVENOUS

## 2013-09-17 MED ORDER — ALLOPURINOL 100 MG PO TABS
100.0000 mg | ORAL_TABLET | Freq: Every day | ORAL | Status: DC
  Administered 2013-09-17 – 2013-09-24 (×7): 100 mg via ORAL
  Filled 2013-09-17 (×7): qty 1

## 2013-09-17 MED ORDER — SODIUM CHLORIDE 0.9 % IJ SOLN
3.0000 mL | Freq: Two times a day (BID) | INTRAMUSCULAR | Status: DC
  Administered 2013-09-17 – 2013-09-24 (×7): 3 mL via INTRAVENOUS

## 2013-09-17 MED ORDER — SODIUM CHLORIDE 0.9 % IV SOLN
250.0000 mL | INTRAVENOUS | Status: DC | PRN
Start: 2013-09-17 — End: 2013-09-24

## 2013-09-17 MED ORDER — DILTIAZEM HCL ER COATED BEADS 180 MG PO CP24
180.0000 mg | ORAL_CAPSULE | Freq: Every day | ORAL | Status: DC
  Administered 2013-09-18 – 2013-09-20 (×3): 180 mg via ORAL
  Filled 2013-09-17 (×4): qty 1

## 2013-09-17 MED ORDER — ACETAMINOPHEN 325 MG PO TABS
650.0000 mg | ORAL_TABLET | Freq: Four times a day (QID) | ORAL | Status: DC | PRN
  Administered 2013-09-20: 650 mg via ORAL
  Filled 2013-09-17: qty 2

## 2013-09-17 MED ORDER — MORPHINE SULFATE 2 MG/ML IJ SOLN
2.0000 mg | INTRAMUSCULAR | Status: DC | PRN
  Administered 2013-09-17 – 2013-09-24 (×22): 2 mg via INTRAVENOUS
  Filled 2013-09-17 (×22): qty 1

## 2013-09-17 MED ORDER — EPOETIN ALFA 10000 UNIT/ML IJ SOLN
4000.0000 [IU] | INTRAMUSCULAR | Status: DC
  Administered 2013-09-18: 09:00:00 via INTRAVENOUS
  Administered 2013-09-20: 4000 [IU] via INTRAVENOUS
  Filled 2013-09-17 (×4): qty 1

## 2013-09-17 MED ORDER — ONDANSETRON HCL 4 MG/2ML IJ SOLN: 4.0000 mg | Freq: Three times a day (TID) | INTRAMUSCULAR | Status: DC | PRN

## 2013-09-17 MED ORDER — HYDROMORPHONE HCL PF 1 MG/ML IJ SOLN
0.5000 mg | Freq: Once | INTRAMUSCULAR | Status: AC
  Administered 2013-09-17: 0.5 mg via INTRAVENOUS
  Filled 2013-09-17: qty 1

## 2013-09-17 MED ORDER — HYDROMORPHONE HCL PF 1 MG/ML IJ SOLN: 1.0000 mg | INTRAMUSCULAR | Status: DC | PRN

## 2013-09-17 MED ORDER — INSULIN GLARGINE 100 UNIT/ML ~~LOC~~ SOLN
5.0000 [IU] | Freq: Every day | SUBCUTANEOUS | Status: DC
  Administered 2013-09-17: 5 [IU] via SUBCUTANEOUS
  Filled 2013-09-17 (×2): qty 0.05

## 2013-09-17 MED ORDER — ONDANSETRON HCL 4 MG/2ML IJ SOLN: 4.0000 mg | Freq: Four times a day (QID) | INTRAMUSCULAR | Status: DC | PRN

## 2013-09-17 MED ORDER — INSULIN ASPART 100 UNIT/ML ~~LOC~~ SOLN: 0.0000 [IU] | Freq: Four times a day (QID) | SUBCUTANEOUS | Status: DC

## 2013-09-17 MED ORDER — AMIODARONE HCL 200 MG PO TABS
100.0000 mg | ORAL_TABLET | Freq: Every day | ORAL | Status: DC
  Administered 2013-09-17 – 2013-09-24 (×8): 100 mg via ORAL
  Filled 2013-09-17 (×9): qty 1

## 2013-09-17 MED ORDER — ACETAMINOPHEN 650 MG RE SUPP: 650.0000 mg | Freq: Four times a day (QID) | RECTAL | Status: DC | PRN

## 2013-09-17 MED ORDER — SODIUM CHLORIDE 0.9 % IV SOLN: INTRAVENOUS | Status: DC

## 2013-09-17 MED ORDER — METOPROLOL TARTRATE 25 MG PO TABS
25.0000 mg | ORAL_TABLET | Freq: Three times a day (TID) | ORAL | Status: DC
  Administered 2013-09-17 – 2013-09-24 (×19): 25 mg via ORAL
  Filled 2013-09-17 (×19): qty 1

## 2013-09-17 MED ORDER — PANTOPRAZOLE SODIUM 40 MG PO TBEC
40.0000 mg | DELAYED_RELEASE_TABLET | Freq: Two times a day (BID) | ORAL | Status: DC
  Administered 2013-09-17 – 2013-09-24 (×14): 40 mg via ORAL
  Filled 2013-09-17 (×14): qty 1

## 2013-09-17 MED ORDER — ATORVASTATIN CALCIUM 20 MG PO TABS
20.0000 mg | ORAL_TABLET | Freq: Every day | ORAL | Status: DC
  Administered 2013-09-17 – 2013-09-23 (×7): 20 mg via ORAL
  Filled 2013-09-17 (×7): qty 1

## 2013-09-17 MED ORDER — CIPROFLOXACIN IN D5W 400 MG/200ML IV SOLN
400.0000 mg | Freq: Once | INTRAVENOUS | Status: AC
  Administered 2013-09-17: 400 mg via INTRAVENOUS
  Filled 2013-09-17: qty 200

## 2013-09-17 NOTE — H&P (Signed)
History and Physical  [name] [name] YQM:[id number] DOB: 08-Jan-1951 DOA: 09/17/2013  Referring physician: Dr. Vanita Panda in ED PCP: [name], MD   Chief Complaint: Pain right stump s/p BKA after fall this AM  HPI:  63 year old man hospitalized 07/2013 for sepsis, osteomyelitis of the right foot, underwent BKA. Today he fell on the right BKA stump resulting in rupture of wound. Because of medical complexity, medical admission was requested.  He reports he has been doing well, he was just released from rehabilitation yesterday 1/20. Today he was ambulating to the bathroom when he fell onto his stump with immediate pain. He has no complaints otherwise. History of coronary artery disease but has had no recent chest pain or shortness of breath.  In the emergency department noted to be afebrile with stable vital signs. Basic metabolic panel consistent with end-stage renal disease. CBC consistent with anemia of chronic disease. Chest x-ray with cardiomegaly.  Review of Systems:  Negative for fever, visual changes, sore throat, rash, chest pain, SOB, dysuria, bleeding, n/v/abdominal pain.  Past Medical History  Diagnosis Date  . UGI bleed 02/12/11    on anticoagulation; EGD w/snare polypectomy-multiple polypoid lesions antrum(benign), Chronic active gastritis (NEGATIVE H pylori)  . Anemia, chronic disease   . Sepsis due to enterococcus 02/11/11  . Atrial fibrillation     coumadin  . CAD (coronary artery disease)     s/p CABGx4  . Cardiomyopathy     40-45% EF 6/12  . Diabetes mellitus   . Hypertension   . Hyperlipemia   . Gout   . S/P patent foramen ovale closure   . History of nuclear stress test 11/2011    negative myoview  . End stage kidney disease     T/TH/SAT dialyis, Dr Hinda Lenis  . Gout   . History of nuclear stress test 07/04/2010    dipyridamole; mod fixed inferolateral defect; non-diagnostic for ischemia; low risk scan   . Hyperbilirubinemia 08/10/2013    Past Surgical  History  Procedure Laterality Date  . Cardiac catheterization  12/31/2006    severe diffuse 3 vessel CAD, severe 4+ MR (Dr. Jackie Plum)  . Mitral valve replacement  01/05/2007    bioprosthetic 75mm Edwards precordial tissue (Dr. Servando Snare)  . Coronary artery bypass graft  01/05/2007    x4; LIMA-LAD, SVG-OM, seq. SVG-PDA, PLA-RCA  . Colonoscopy  06/23/2011    Dr. Oneida Alar: multiple sessile and pedunculated polyps, moderate diverticulosis, internal hemorrhoids, +ADENOMATOUS POLYPS, consider genetic testing, surveillance in October 2015   . Esophagogastroduodenoscopy  02/12/2011    CHRONIC ACTIVE GASTRITIS, NO H. pylori  . Transthoracic echocardiogram  11/2011    EF 25% w/ mod dilatation of LV & mod LVH; LA severely dilated; mod-severe dilation of RV with mod-severe decrease in RV function; mild MR & TR  . Cardioversion  04/19/2007    Dr. Marella Chimes  . Transthoracic echocardiogram  02/14/2011    EF 40-45%, mod conc LVH; ventricular septum with motion showing abnormal function, dyssynergy, paradox; mild AS; mild-mod MR stenosis; LA severely dilated; aptrial septum bowed from L to R with increased LA pressure   . Amputation Right 07/14/2013    Procedure: AMPUTATION DIGIT;  Surgeon: [name], MD;  Location: [facility];  Service: General;  Laterality: Right;  . Amputation Right 08/08/2013    Procedure: AMPUTATION BELOW KNEE;  Surgeon: [name], MD;  Location: [facility];  Service: General;  Laterality: Right;    Social History:  reports that he quit smoking about 6  years ago. His smoking use included Cigarettes. He smoked 1.00 pack per day. He does not have any smokeless tobacco history on file. He reports that he does not drink alcohol or use illicit drugs.  Allergies  Allergen Reactions  . Bacitracin Other (See Comments)    unknown  . Norvasc [Amlodipine Besylate] Other (See Comments)    unknown    Family History  Problem Relation Age of Onset  . Diabetes Mother   . Diabetes Father   .  Diabetes Sister   . Colon cancer Neg Hx      Prior to Admission medications   Medication Sig Start Date End Date Taking? Authorizing Provider  allopurinol (ZYLOPRIM) 100 MG tablet Take 100 mg by mouth daily.  05/15/11  Yes Historical Provider, MD  aspirin 81 MG tablet Take 81 mg by mouth at bedtime.    Yes Historical Provider, MD  atorvastatin (LIPITOR) 20 MG tablet Take 20 mg by mouth daily at 6 PM. 02/07/13  Yes Rebecca Eaton, MD  Multiple Vitamin (MULTIVITAMIN) capsule Take 1 capsule by mouth daily.   Yes Historical Provider, MD  pantoprazole (PROTONIX) 40 MG tablet Take 40 mg by mouth 2 (two) times daily.  05/25/11  Yes Historical Provider, MD  RENVELA 800 MG tablet Take 800 mg by mouth 3 (three) times daily with meals.  05/03/11  Yes Historical Provider, MD  traZODone (DESYREL) 50 MG tablet Take 1 tablet (50 mg total) by mouth at bedtime. 08/13/13  Yes Nita Sells, MD  vancomycin (VANCOCIN) 1 GM/200ML SOLN Inject 200 mLs (1,000 mg total) into the vein Every Tuesday,Thursday,and Saturday with dialysis. 08/13/13  Yes Nita Sells, MD  amiodarone (PACERONE) 200 MG tablet Take 0.5 tablets (100 mg total) by mouth daily. 08/13/13   Nita Sells, MD  B Complex-C-Zn-Folic Acid (DIALYVITE/ZINC PO) Take by mouth 3 (three) times a week. Patient has dialysis on Tuesdays, Thursdays, and Saturdays    Historical Provider, MD  diltiazem (CARDIZEM CD) 180 MG 24 hr capsule Take 1 capsule (180 mg total) by mouth daily. 07/18/13   Kathie Dike, MD  folic acid (FOLVITE) 1 MG tablet Take 1 mg by mouth daily.      Historical Provider, MD  glimepiride (AMARYL) 4 MG tablet Take 4 mg by mouth 2 (two) times daily.      Historical Provider, MD  Insulin Glargine (LANTUS) 100 UNIT/ML Solostar Pen Inject 38 Units into the skin at bedtime. 08/13/13   Nita Sells, MD  metoprolol tartrate (LOPRESSOR) 25 MG tablet Take 1 tablet (25 mg total) by mouth 3 (three) times daily. 08/13/13    Nita Sells, MD   Physical Exam: Filed Vitals:   09/17/13 0736 09/17/13 0738  BP:  134/94  Pulse:  101  Temp:  98.3 F (36.8 C)  Resp:  18  Height: 6\' 4"  (1.93 m)   Weight: 105.235 kg (232 lb)   SpO2:  97%    General: Examined in the emergency department. Appears calm and comfortable Eyes: PERRL, normal lids, irises  ENT: grossly normal hearing, lips & tongue Neck: no LAD, masses or thyromegaly Cardiovascular: RRR, no m/r/g. No LE edema. The right stump is dressed. Respiratory: CTA bilaterally, no w/r/r. Normal respiratory effort. Abdomen: soft, ntnd Skin: no rash or induration seen  Musculoskeletal: grossly normal tone BUE/BLE Psychiatric: grossly normal mood and affect, speech fluent and appropriate Neurologic: grossly non-focal.  Wt Readings from Last 3 Encounters:  09/17/13 105.235 kg (232 lb)  08/13/13 101.3 kg (223 lb 5.2 oz)  08/13/13 101.3 kg (223 lb 5.2 oz)    Labs on Admission:  Basic Metabolic Panel:  Recent Labs Lab 09/17/13 0812  NA 145  K 4.6  CL 101  CO2 28  GLUCOSE 92  BUN 34*  CREATININE 7.11*  CALCIUM 10.2   CBC:  Recent Labs Lab 09/17/13 0812  WBC 7.5  NEUTROABS 5.7  HGB 10.1*  HCT 32.4*  MCV 94.5  PLT 167   Radiological Exams on Admission: Dg Chest 2 View  09/17/2013   CLINICAL DATA:  Preoperative films.  EXAM: CHEST  2 VIEW  COMPARISON:  Single view of the chest 08/12/2013.  FINDINGS: There is cardiomegaly and vascular congestion. Right pleural effusion and basilar airspace disease seen on the prior study have markedly improved with only a small residual effusion identified. No pneumothorax.  IMPRESSION: Cardiomegaly and mild vascular congestion.  Small right pleural effusion and basilar atelectasis are markedly improved since the prior study.   Electronically Signed   By: Inge Rise M.D.   On: 09/17/2013 09:35    EKG: Independently reviewed. Normal sinus rhythm. Anteroseptal MI, old. Nonspecific ST changes lateral  leads. Compared to previous study 08/06/2013, no acute changes seen. Rhythm now sinus.   Principal Problem:   Traumatic wound dehiscence Active Problems:   Atrial fibrillation   Martus term (current) use of anticoagulants   CAD (coronary artery disease)   S/P CABG (coronary artery bypass graft)   H/O mitral valve replacement   ESRD (end stage renal disease) on dialysis   DM type 2 causing ESRD   Anemia of chronic disease   Fall   Assessment/Plan 1. Traumatic dehiscence of R BKA s/p probable mechanical fall. 2. Atrial fibrillation maintained on warfarin. Appears stable. Continue amiodarone, metoprolol, diltiazem. 3. ESRD on hemodialysis. Potassium normal. 4. Anemia of chronic disease, appears stable. 5. Diabetes mellitus type 2. Stable.  6. History of coronary artery disease, CABG 2008, status post bioprosthetic mitral valve replacement 2008. Negative nuclear stress 11/2011. 7. History of chronic systolic congestive heart failure, last EF reportedly 40-45% 6/12.   Surgical consultation for definitive management of traumatic wound. Tolerated surgery well last month. No recent chest pain or shortness of breath. Continue aspirin, beta blocker.  Check PT/INR. Hold warfarin. Resume warfarin after surgery.  Consult nephrology for routine hemodialysis  CBC, basic metabolic panel morning  Sliding-scale insulin, decreased dose of Lantus. Hold Amaryl.  Code Status: full code  DVT prophylaxis:as above Family Communication: none present Disposition Plan/Anticipated LOS: admit 1-2 days  Time spent: 60 minutes  [name], MD  [facility] Pager [phone] 09/17/2013, 9:26 AM

## 2013-09-17 NOTE — ED Provider Notes (Addendum)
CSN: [id number]     Arrival date & time 09/17/13  [id number] History  This chart was scribed for Carmin Muskrat, MD by Rolanda Lundborg, ED Scribe. This patient was seen in room [facility]16[facility]A and the patient's care was started at 7:38 AM.    Chief Complaint  Patient presents with  . Fall   The history is provided by the patient. No language interpreter was used.   HPI Comments: [name] [name] is a 63 y.o. male with a h/o right BKA who presents to the Emergency Department complaining of a fall this morning that made the stump come off [name] with associated burning pain. He denies problems with the stump before today. He had the surgery done here at [facility] one month ago. Staples were removed without complication approximately one week ago. He notes that the wound has been generally well, and he receives every other day wound care. He denies other focal complaints currently.   Past Medical History  Diagnosis Date  . UGI bleed 02/12/11    on anticoagulation; EGD w/snare polypectomy-multiple polypoid lesions antrum(benign), Chronic active gastritis (NEGATIVE H pylori)  . Anemia, chronic disease   . Sepsis due to enterococcus 02/11/11  . Atrial fibrillation     coumadin  . CAD (coronary artery disease)     s/p CABGx4  . Cardiomyopathy     40-45% EF 6/12  . Diabetes mellitus   . Hypertension   . Hyperlipemia   . Gout   . S/P patent foramen ovale closure   . History of nuclear stress test 11/2011    negative myoview  . End stage kidney disease     T/TH/SAT dialyis, Dr Hinda Lenis  . Gout   . History of nuclear stress test 07/04/2010    dipyridamole; mod fixed inferolateral defect; non-diagnostic for ischemia; low risk scan   . Hyperbilirubinemia 08/10/2013   Past Surgical History  Procedure Laterality Date  . Cardiac catheterization  12/31/2006    severe diffuse 3 vessel CAD, severe 4+ MR (Dr. Jackie Plum)  . Mitral valve replacement  01/05/2007    bioprosthetic 59mm Edwards precordial tissue (Dr.  Servando Snare)  . Coronary artery bypass graft  01/05/2007    x4; LIMA-LAD, SVG-OM, seq. SVG-PDA, PLA-RCA  . Colonoscopy  06/23/2011    Dr. Oneida Alar: multiple sessile and pedunculated polyps, moderate diverticulosis, internal hemorrhoids, +ADENOMATOUS POLYPS, consider genetic testing, surveillance in October 2015   . Esophagogastroduodenoscopy  02/12/2011    CHRONIC ACTIVE GASTRITIS, NO H. pylori  . Transthoracic echocardiogram  11/2011    EF 25% w/ mod dilatation of LV & mod LVH; LA severely dilated; mod-severe dilation of RV with mod-severe decrease in RV function; mild MR & TR  . Cardioversion  04/19/2007    Dr. Marella Chimes  . Transthoracic echocardiogram  02/14/2011    EF 40-45%, mod conc LVH; ventricular septum with motion showing abnormal function, dyssynergy, paradox; mild AS; mild-mod MR stenosis; LA severely dilated; aptrial septum bowed from L to R with increased LA pressure   . Amputation Right 07/14/2013    Procedure: AMPUTATION DIGIT;  Surgeon: [name], MD;  Location: [facility];  Service: General;  Laterality: Right;  . Amputation Right 08/08/2013    Procedure: AMPUTATION BELOW KNEE;  Surgeon: [name], MD;  Location: [facility];  Service: General;  Laterality: Right;   Family History  Problem Relation Age of Onset  . Diabetes Mother   . Diabetes Father   . Diabetes Sister   . Colon cancer  Neg Hx    History  Substance Use Topics  . Smoking status: Former Smoker -- 1.00 packs/day    Types: Cigarettes    Quit date: 11/27/2006  . Smokeless tobacco: Not on file     Comment: quit about 5 yrs  . Alcohol Use: No     Comment: former user    Review of Systems  Constitutional:       Per HPI, otherwise negative  HENT:       Per HPI, otherwise negative  Respiratory:       Per HPI, otherwise negative  Cardiovascular:       Per HPI, otherwise negative  Gastrointestinal: Negative for vomiting.  Endocrine:       Negative aside from HPI  Genitourinary:       Neg aside from HPI    Musculoskeletal:       Per HPI, otherwise negative  Skin: Positive for wound.  Neurological: Negative for syncope.    Allergies  Bacitracin and Norvasc  Home Medications   Current Outpatient Rx  Name  Route  Sig  Dispense  Refill  . allopurinol (ZYLOPRIM) 100 MG tablet   Oral   Take 100 mg by mouth daily.          Marland Kitchen amiodarone (PACERONE) 200 MG tablet   Oral   Take 0.5 tablets (100 mg total) by mouth daily.   60 tablet      . aspirin 81 MG tablet   Oral   Take 81 mg by mouth daily.           Marland Kitchen atorvastatin (LIPITOR) 20 MG tablet   Oral   Take 1 tablet (20 mg total) by mouth daily.   30 tablet   11     ERX Atorvastatin 20 mg to [facility]   . B Complex-C-Zn-Folic Acid (DIALYVITE/ZINC PO)   Oral   Take by mouth 3 (three) times a week. Patient has dialysis on Tuesdays, Thursdays, and Saturdays         . diltiazem (CARDIZEM CD) 180 MG 24 hr capsule   Oral   Take 1 capsule (180 mg total) by mouth daily.   30 capsule   1   . folic acid (FOLVITE) 1 MG tablet   Oral   Take 1 mg by mouth daily.           Marland Kitchen glimepiride (AMARYL) 4 MG tablet   Oral   Take 4 mg by mouth 2 (two) times daily.           . Insulin Glargine (LANTUS SOLOSTAR) 100 UNIT/ML SOPN   Subcutaneous   Inject 10 Units into the skin at bedtime.         Marland Kitchen levofloxacin (LEVAQUIN) 500 MG tablet   Oral   Take 1 tablet (500 mg total) by mouth every other day.   5 tablet      . metoprolol tartrate (LOPRESSOR) 25 MG tablet   Oral   Take 1 tablet (25 mg total) by mouth 3 (three) times daily.         . Multiple Vitamin (MULTIVITAMIN) capsule   Oral   Take 1 capsule by mouth daily.         . pantoprazole (PROTONIX) 40 MG tablet   Oral   Take 40 mg by mouth 2 (two) times daily.          Marland Kitchen RENVELA 800 MG tablet   Oral   Take 800 mg by mouth 3 (three)  times daily with meals.          . traZODone (DESYREL) 50 MG tablet   Oral   Take 1 tablet (50 mg total) by mouth at  bedtime.   30 tablet   0   . vancomycin (VANCOCIN) 1 GM/200ML SOLN   Intravenous   Inject 200 mLs (1,000 mg total) into the vein Every Tuesday,Thursday,and Saturday with dialysis.   4000 mL        Needs 10 days therapy    BP 134/94  Pulse 101  Temp(Src) 98.3 F (36.8 C)  Resp 18  Ht 6\' 4"  (1.93 m)  Wt 232 lb (105.235 kg)  BMI 28.25 kg/m2  SpO2 97% Physical Exam  Nursing note and vitals reviewed. Constitutional: He is oriented to person, place, and time. He appears well-developed. No distress.  HENT:  Head: Normocephalic and atraumatic.  Eyes: Conjunctivae and EOM are normal.  Cardiovascular: Regular rhythm.  Tachycardia present.   Pulmonary/Chest: Effort normal. No stridor. No respiratory distress.  Abdominal: He exhibits no distension.  Musculoskeletal: He exhibits no edema.  Right below knee amputation with open wound at the previously surgically repaired site.  Minimal active bleeding, though the dressing was left in place to maximize hemostasis.  Neurological: He is alert and oriented to person, place, and time.  Skin: Skin is warm and dry.  Psychiatric: He has a normal mood and affect.    ED Course  Wound repair Date/Time: 09/17/2013 9:05 AM Performed by: Gerhard Munch Authorized by: Gerhard Munch Consent: Verbal consent obtained. Risks and benefits: risks, benefits and alternatives were discussed Consent given by: patient Patient understanding: patient states understanding of the procedure being performed Patient consent: the patient's understanding of the procedure matches consent given Procedure consent: procedure consent matches procedure scheduled Relevant documents: relevant documents present and verified Test results: test results available and properly labeled Site marked: the operative site was marked Imaging studies: imaging studies available Required items: required blood products, implants, devices, and special equipment available Patient  identity confirmed: verbally with patient Time out: Immediately prior to procedure a "time out" was called to verify the correct patient, procedure, equipment, support staff and site/side marked as required. Preparation: Patient was prepped and draped in the usual sterile fashion. Local anesthesia used: no Patient sedated: no Patient tolerance: Patient tolerated the procedure well with no immediate complications. Comments: Patient's traumatic dehiscence of R BKA was irrigated copiously with NS, wound cultures were obtained, and the wound was dressed with gauze, kerlex, ace with good hemostasis.    (including critical care time) Medications - No data to display  DIAGNOSTIC STUDIES: Oxygen Saturation is 97% on RA, normal by my interpretation.    COORDINATION OF CARE: 7:42 AM- Discussed treatment plan with pt which includes EKG, CXR, basic labs, and surgery consult. Pt agrees to plan.   Update: I discussed the patient's case with our surgeon on call, who is covering for the surgeon who performed the below knee amputation one month ago. Patient will be admitted to the hospitalist service.   Labs Review Labs Reviewed - No data to display Imaging Review No results found.  EKG Interpretation   None       MDM  Patient presents with mechanical fall with traumatic dehiscence of below-knee amputation closure. The patient has no other complaints. The patient is hemodynamically stable, without other complaints, but will require admission for antibiotics, surgical repair. Given his comorbidities he was admitted to the hospitalist team at the request of the  surgical team.  Carmin Muskrat, MD 09/17/13 [place], MD 09/17/13 [phone]

## 2013-09-17 NOTE — Progress Notes (Addendum)
ANTICOAGULATION CONSULT NOTE - Initial Consult  Pharmacy Consult for heparin Indication: atrial fibrillation  Allergies  Allergen Reactions  . Bacitracin Other (See Comments)    unknown  . Norvasc [Amlodipine Besylate] Other (See Comments)    unknown    Patient Measurements: Height: 5\' 11"  (180.3 cm) Weight: 213 lb (96.616 kg) IBW/kg (Calculated) : 75.3 Heparin Dosing Weight: 80kg  Vital Signs: Temp: 97.6 F (36.4 C) (01/21 0957) Temp src: Oral (01/21 0957) BP: 142/94 mmHg (01/21 0957) Pulse Rate: 93 (01/21 0957)  Labs:  Recent Labs  09/17/13 0812  HGB 10.1*  HCT 32.4*  PLT 167  APTT 37  LABPROT 21.5*  INR 1.93*  CREATININE 7.11*    Estimated Creatinine Clearance: 12.4 ml/min (by C-G formula based on Cr of 7.11).   Medical History: Past Medical History  Diagnosis Date  . UGI bleed 02/12/11    on anticoagulation; EGD w/snare polypectomy-multiple polypoid lesions antrum(benign), Chronic active gastritis (NEGATIVE H pylori)  . Anemia, chronic disease   . Sepsis due to enterococcus 02/11/11  . Atrial fibrillation     coumadin  . CAD (coronary artery disease)     s/p CABGx4  . Cardiomyopathy     40-45% EF 6/12  . Diabetes mellitus   . Hypertension   . Hyperlipemia   . Gout   . S/P patent foramen ovale closure   . History of nuclear stress test 11/2011    negative myoview  . End stage kidney disease     T/TH/SAT dialyis, Dr Hinda Lenis  . Gout   . History of nuclear stress test 07/04/2010    dipyridamole; mod fixed inferolateral defect; non-diagnostic for ischemia; low risk scan   . Hyperbilirubinemia 08/10/2013    Medications:  Scheduled:  . allopurinol  100 mg Oral Daily  . amiodarone  100 mg Oral Daily  . aspirin EC  81 mg Oral Daily  . atorvastatin  20 mg Oral q1800  . [START ON 09/18/2013] diltiazem  180 mg Oral Daily  . insulin aspart  0-9 Units Subcutaneous Q6H  . insulin glargine  5 Units Subcutaneous QHS  . metoprolol tartrate  25 mg Oral TID   . pantoprazole  40 mg Oral BID  . sevelamer carbonate  800 mg Oral TID WC  . sodium chloride  3 mL Intravenous Q12H  . traZODone  50 mg Oral QHS    Assessment: 63 yo M on chronic warfarin for Afib.  He is admitted s/p fall with plans for OR in am.  INR 1.93 on admission.   No active bleeding.  CBC consistent with anemia of chronic disease.  Start heparin bridge.    Goal of Therapy:  Heparin level 0.3-0.7 units/ml Monitor platelets by anticoagulation protocol: Yes   Plan:  Start heparin infusion at 1000 units/hr Check anti-Xa level in 8 hours and daily while on heparin Continue to monitor H&H and platelets  Marieclaire Bettenhausen, Lavonia Drafts 09/17/2013,12:38 PM  Initial heparin level undetectable.  Increase heparin rate. Re-check heparin 8 hr heparin level.  Noted pt may go to OR tomorrow is cleared medically- F/U orders to hold heparin pre-op.

## 2013-09-17 NOTE — ED Notes (Signed)
Pt c/o pain to r bka after sliding to floor while transferring this am. Denies hitting head/loc. nad noted.

## 2013-09-17 NOTE — Consult Note (Signed)
[name] [name] MRN: [id number] DOB/AGE: 05/02/1951 64 y.o. Primary Care Physician:FUSCO,LAWRENCE J., MD Admit date: 09/17/2013 Chief Complaint:  Chief Complaint  Patient presents with  . Fall   HPI: Pt is 63 year old male with past medical hx of ESRD who presented to er with c/o fall.   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 Pt has No c/o Chest pain No c/o syncope No c/o head trauma NO c/o nausea/ vomiting  no v/o abdominal pain  NO c/o dizziness. No c/o prodromal symptom  before fall.  Pt id on HD on TTS schedule. Pt last hd was on Tuesday.    Past Medical History  Diagnosis Date  . UGI bleed 02/12/11    on anticoagulation; EGD w/snare polypectomy-multiple polypoid lesions antrum(benign), Chronic active gastritis (NEGATIVE H pylori)  . Anemia, chronic disease   . Sepsis due to enterococcus 02/11/11  . Atrial fibrillation     coumadin  . CAD (coronary artery disease)     s/p CABGx4  . Cardiomyopathy     40-45% EF 6/12  . Diabetes mellitus   . Hypertension   . Hyperlipemia   . Gout   . S/P patent foramen ovale closure   . History of nuclear stress test 11/2011    negative myoview  . End stage kidney disease     T/TH/SAT dialyis, Dr Hinda Lenis  . Gout   . History of nuclear stress test 07/04/2010    dipyridamole; mod fixed inferolateral defect; non-diagnostic for ischemia; low risk scan   . Hyperbilirubinemia 08/10/2013        Family History  Problem Relation Age of Onset  . Diabetes Mother   . Diabetes Father   . Diabetes Sister   . Colon cancer Neg Hx   NO hx of ESRD  Social History:  reports that he quit smoking about 6 years ago. His smoking use included Cigarettes. He smoked 1.00 pack per day. He does not have any smokeless tobacco history on file. He reports that he does not drink alcohol or use illicit drugs.   Allergies:  Allergies  Allergen Reactions  . Bacitracin Other (See Comments)   unknown  . Norvasc [Amlodipine Besylate] Other (See Comments)    unknown    Medications Prior to Admission  Medication Sig Dispense Refill  . allopurinol (ZYLOPRIM) 100 MG tablet Take 100 mg by mouth daily.       Marland Kitchen amiodarone (PACERONE) 200 MG tablet Take 0.5 tablets (100 mg total) by mouth daily.  60 tablet    . aspirin 81 MG tablet Take 81 mg by mouth daily.       Marland Kitchen atorvastatin (LIPITOR) 20 MG tablet Take 20 mg by mouth daily at 6 PM.      . B Complex-C-Zn-Folic Acid (DIALYVITE/ZINC PO) Take by mouth 3 (three) times a week. Patient has dialysis on Tuesdays, Thursdays, and Saturdays      . diltiazem (CARDIZEM CD) 180 MG 24 hr capsule Take 1 capsule (180 mg total) by mouth daily.  30 capsule  1  . folic acid (FOLVITE) 1 MG tablet Take 1 mg by mouth daily.        Marland Kitchen glimepiride (AMARYL) 4 MG tablet Take 4 mg by mouth 2 (two) times daily.        . insulin glargine (LANTUS) 100 UNIT/ML injection Inject 10 Units into the skin at bedtime.      . metoprolol  tartrate (LOPRESSOR) 25 MG tablet Take 1 tablet (25 mg total) by mouth 3 (three) times daily.      . Multiple Vitamin (MULTIVITAMIN) capsule Take 1 capsule by mouth daily.      . pantoprazole (PROTONIX) 40 MG tablet Take 40 mg by mouth 2 (two) times daily.       Marland Kitchen RENVELA 800 MG tablet Take 800 mg by mouth 3 (three) times daily with meals.       . traZODone (DESYREL) 50 MG tablet Take 1 tablet (50 mg total) by mouth at bedtime.  30 tablet  0  . warfarin (COUMADIN) 1 MG tablet Take 1-1.5 mg by mouth daily at 6 PM. Takes 1 tablet (1 mg) Monday thru Friday and takes 1.5 tablets (1.5 mg) on Saturday and Sunday           YTK:ZSWFU from the symptoms mentioned above,there are no other symptoms referable to all systems reviewed.  Marland Kitchen allopurinol  100 mg Oral Daily  . amiodarone  100 mg Oral Daily  . aspirin EC  81 mg Oral Daily  . atorvastatin  20 mg Oral q1800  . [START ON 09/18/2013] diltiazem  180 mg Oral Daily  . insulin aspart  0-9 Units  Subcutaneous Q6H  . insulin glargine  5 Units Subcutaneous QHS  . metoprolol tartrate  25 mg Oral TID  . pantoprazole  40 mg Oral BID  . sevelamer carbonate  800 mg Oral TID WC  . sodium chloride  3 mL Intravenous Q12H  . traZODone  50 mg Oral QHS      Physical Exam: Vital signs in last 24 hours: Temp:  [97.6 F (36.4 C)-98.3 F (36.8 C)] 97.6 F (36.4 C) (01/21 0957) Pulse Rate:  [89-101] 93 (01/21 0957) Resp:  [16-18] 16 (01/21 0957) BP: (131-142)/(80-94) 142/94 mmHg (01/21 0957) SpO2:  [97 %-100 %] 98 % (01/21 0957) Weight:  [213 lb (96.616 kg)-232 lb (105.235 kg)] 213 lb (96.616 kg) (01/21 0957) Weight change:  Last BM Date: 09/17/13   Physical Exam: General- pt is awake,alert, oriented to time place and person Resp- No acute REsp distress, CTA B/L NO Rhonchi CVS- S1S2 Irregular in rate and rhythm GIT- BS+, soft, NT, ND EXT- NO [name] Edema, Cyanosis, Right BKA ( in bandage) CNS- CN 2-12 grossly intact. Moving all 4 extremities Psych- normal mood and affect Access-  AVF+ Bruit    Lab Results: CBC  Recent Labs  09/17/13 0812  WBC 7.5  HGB 10.1*  HCT 32.4*  PLT 167    BMET  Recent Labs  09/17/13 0812  NA 145  K 4.6  CL 101  CO2 28  GLUCOSE 92  BUN 34*  CREATININE 7.11*  CALCIUM 10.2    MICRO No results found for this or any previous visit (from the past 240 hour(s)).    Lab Results  Component Value Date   CALCIUM 10.2 09/17/2013   PHOS 4.6 08/11/2013      Impression: 1)Renal  ESRD on HD  ON TTS schedule  Pt Was Dialyzed yesterday  No need of HD today   2)HTN Target Organ damage  CKD CAD CHF  PVD Medication- On Calcium Channel Blockers   3)Anemia HGb at goal (9--11)   4)CKD Mineral-Bone Disorder Phosphorus at goal. Calcium at goal  5)PVD Primary MD following  6)Electrolytes  Normokalemic Normonatremic   7)Acid base Co2 at goal     Plan:  NO need of Hd today Will dialyze in am Will not use  any heparin  as pt on IV heparin Will use 2 k bath Will keep on epo      Zuleika Gallus S 09/17/2013, 11:34 AM

## 2013-09-17 NOTE — ED Notes (Signed)
Vital signs stable. 

## 2013-09-17 NOTE — Progress Notes (Signed)
Surgery  In essence, 48 yr. Old African American with multiple medical problems including CAD, ESRD, Atrial Fib, DM type II, had a R BKA 08/08/13 fell this AM and dehisced his entire wound.  Wound however is very clean, min. Sharp debridement done at bedside and wound packed open with saline soaked roll gauze after irrigation.  I have discussed case with hospitalist and will plan for primary closure in OR in AM if cleared medically.  Hospitalist will be following anticoagulation status and blood sugar and renal status perioperatively.  Will type and cross for 1 unit PRBC and have discussed surgery and risks with pt who will sign his own consent.

## 2013-09-18 DIAGNOSIS — Z7901 Long term (current) use of anticoagulants: Secondary | ICD-10-CM

## 2013-09-18 LAB — HEPARIN LEVEL (UNFRACTIONATED)
HEPARIN UNFRACTIONATED: 0.15 [IU]/mL — AB (ref 0.30–0.70)
Heparin Unfractionated: 0.27 IU/mL — ABNORMAL LOW (ref 0.30–0.70)

## 2013-09-18 LAB — BASIC METABOLIC PANEL
BUN: 22 mg/dL (ref 6–23)
CALCIUM: 9.6 mg/dL (ref 8.4–10.5)
CO2: 27 mEq/L (ref 19–32)
CREATININE: 4.77 mg/dL — AB (ref 0.50–1.35)
Chloride: 100 mEq/L (ref 96–112)
GFR, EST AFRICAN AMERICAN: 14 mL/min — AB (ref >90)
GFR, EST NON AFRICAN AMERICAN: 12 mL/min — AB (ref >90)
Glucose, Bld: 91 mg/dL (ref 70–99)
POTASSIUM: 3.6 meq/L — AB (ref 3.7–5.3)
Sodium: 142 mEq/L (ref 137–147)

## 2013-09-18 LAB — CBC
HCT: 31.1 % — ABNORMAL LOW (ref 39.0–52.0)
Hemoglobin: 9.9 g/dL — ABNORMAL LOW (ref 13.0–17.0)
MCH: 29.6 pg (ref 26.0–34.0)
MCHC: 31.8 g/dL (ref 30.0–36.0)
MCV: 93.1 fL (ref 78.0–100.0)
Platelets: 175 10*3/uL (ref 150–400)
RBC: 3.34 MIL/uL — ABNORMAL LOW (ref 4.22–5.81)
RDW: 16.7 % — ABNORMAL HIGH (ref 11.5–15.5)
WBC: 7.9 10*3/uL (ref 4.0–10.5)

## 2013-09-18 LAB — GLUCOSE, CAPILLARY
GLUCOSE-CAPILLARY: 76 mg/dL (ref 70–99)
GLUCOSE-CAPILLARY: 121 mg/dL — AB (ref 70–99)
Glucose-Capillary: 94 mg/dL (ref 70–99)
Glucose-Capillary: 97 mg/dL (ref 70–99)

## 2013-09-18 LAB — MRSA PCR SCREENING: MRSA BY PCR: POSITIVE — AB

## 2013-09-18 LAB — PROTIME-INR
INR: 1.98 — AB (ref 0.00–1.49)
PROTHROMBIN TIME: 21.9 s — AB (ref 11.6–15.2)

## 2013-09-18 LAB — HEPATITIS B SURFACE ANTIGEN: Hepatitis B Surface Ag: NEGATIVE

## 2013-09-18 MED ORDER — SODIUM CHLORIDE 0.9 % IV SOLN: 100.0000 mL | INTRAVENOUS | Status: DC | PRN

## 2013-09-18 MED ORDER — LIDOCAINE-PRILOCAINE 2.5-2.5 % EX CREA
1.0000 "application " | TOPICAL_CREAM | CUTANEOUS | Status: DC | PRN
  Filled 2013-09-18: qty 5

## 2013-09-18 MED ORDER — LIDOCAINE HCL (PF) 1 % IJ SOLN: 5.0000 mL | INTRAMUSCULAR | Status: DC | PRN

## 2013-09-18 MED ORDER — NEPRO/CARBSTEADY PO LIQD: 237.0000 mL | ORAL | Status: DC | PRN

## 2013-09-18 MED ORDER — HEPARIN SODIUM (PORCINE) 1000 UNIT/ML DIALYSIS
1000.0000 [IU] | INTRAMUSCULAR | Status: DC | PRN
  Filled 2013-09-18: qty 1

## 2013-09-18 MED ORDER — DIPHENHYDRAMINE HCL 25 MG PO CAPS
25.0000 mg | ORAL_CAPSULE | Freq: Three times a day (TID) | ORAL | Status: DC | PRN
  Administered 2013-09-18: 25 mg via ORAL
  Filled 2013-09-18: qty 1

## 2013-09-18 MED ORDER — PENTAFLUOROPROP-TETRAFLUOROETH EX AERO
1.0000 "application " | INHALATION_SPRAY | CUTANEOUS | Status: DC | PRN
  Filled 2013-09-18: qty 103.5

## 2013-09-18 NOTE — Progress Notes (Signed)
Pt's surgical PCR has returned positive per lab. Pt placed on contact precautions.

## 2013-09-18 NOTE — Care Management Note (Signed)
    Page 1 of 1   09/18/2013     9:19:58 AM   CARE MANAGEMENT NOTE 09/18/2013  Patient:  [name] [name], [name] [name]   Account Number:  [id number]  Date Initiated:  09/18/2013  Documentation initiated by:  Theophilus Kinds  Subjective/Objective Assessment:   Pt admitted from home with wound dehiscence from a fall. Pt lives with his wife. Pt is active with [facility] for RN, PT and OT. PT receives dialysis with [facility] in [place]. Pt has walker for home use.     Action/Plan:   Pt for surgery today. May need PT eval post op to see if pt may need rehab before going home. CSW is aware of pt and possible discharge needs.   Anticipated DC Date:  09/20/2013   Anticipated DC Plan:  [place] referral  Clinical Social Worker      DC Planning Services  CM consult      Choice offered to / List presented to:             Status of service:  Completed, signed off Medicare Important Message given?   (If response is "NO", the following Medicare IM given date fields will be blank) Date Medicare IM given:   Date Additional Medicare IM given:    Discharge Disposition:  [place]  Per UR Regulation:    If discussed at [place] Length of Stay Meetings, dates discussed:    Comments:  09/18/13 0920 Christinia Gully, RN BSN CM

## 2013-09-18 NOTE — Consult Note (Signed)
[name], [name] NO.:  [id number]  MEDICAL RECORD NO.:  [id number]  LOCATION:  A301                          FACILITY:  [facility]  PHYSICIAN:  Felicie Morn, M.D. DATE OF BIRTH:  [birth year]  DATE OF CONSULTATION:  09/17/2013 DATE OF DISCHARGE:                                CONSULTATION   HISTORY:  Surgery was asked to see this 63 year old African American patient with multiple medical problems, who underwent a right below-the- knee amputation on August 08, 2013.  He apparently was doing well post surgery and with physical therapy, however, today he fell in his bathroom and opening his wound entirely dehiscing this along the entire suture line.  He came to the emergency room and he was admitted to the hospitalist service for his obvious medical problems, and we will plan to take care of his wound.  I saw on his wound and this is completely clean.  I did debride an area by bedside and sharply debride some nonviable wound tissue and irrigated this copiously with normal saline and packed it with saline soaked Kerlix gauze and dressed the wound.  I believe we can approach this with a primary closure and for this wound dehiscence as there is no obvious infection here and the wound actually looks quite good despite the fact that it is entirely dehisced.  I will coordinate the OR with the medical service, so that they are able to coordinate his anticoagulation and his blood sugars and follow him along with me medically.  I will also type and cross him for unit of blood as he met might requirements and we will proceed with surgery as soon as possible in the morning as soon as he has been cleared medically.  I discussed this with the patient and will obtain OR consent. The rest of the chart has been reviewed.  He has multiple medical problems including coronary artery disease with cardiomyopathy, atrial fibrillation, and diabetes type 2.  He is also status post  coronary artery bypass.  He has also has a history of mitral valve replacement.  He has a chronic anemia and end-stage renal failure. He has a history of hypercholesterolemia and hypertension.  We will proceed with surgery when he is cleared medically.     Felicie Morn, M.D.     WB/MEDQ  D:  09/17/2013  T:  09/18/2013  Job:  [id number]  cc:   Dr. Binnie Kand J. Gerarda Fraction, MD Fax: [phone]

## 2013-09-18 NOTE — Progress Notes (Signed)
UR chart review completed.  

## 2013-09-18 NOTE — Progress Notes (Signed)
Surgery  Pt. Had delay in dialysis so we will plan for stump closure in AM  Have discussed with anesthesia and med. Service.  Hold heparin at 7:)) AM 08/2313.  Will check lytes and H/H today.  Wound irrigated and redressed with xeroform.  Wound is very clean.  I think we can proceed with plan in AM.

## 2013-09-18 NOTE — Progress Notes (Signed)
Pt left the floor by nurse to go have dialysis treatment. Pt had bath and CHG bath per protocol. No sign of distress.

## 2013-09-18 NOTE — Procedures (Signed)
   HEMODIALYSIS TREATMENT NOTE:  3.5 hour dialysis completed via right forearm AVF (15g/antegrade).  Goal met:  Tolerated removal of 2 liters with no interruption in ultrafiltration.  Heparin gtt via PIV (1300u/hr) discontinued by primary RN at [phone].  All blood was reinfused.  Hemostasis was achieved within 15 minutes.  Report given to Verlee Monte, RN.  Naaman Curro L. Louden Houseworth, RN, CDN

## 2013-09-18 NOTE — Progress Notes (Addendum)
[name] [name] for heparin Indication: atrial fibrillation  Allergies  Allergen Reactions  . Bacitracin Other (See Comments)    unknown  . Norvasc [Amlodipine Besylate] Other (See Comments)    unknown   Patient Measurements: Height: 5\' 11"  (180.3 cm) Weight:  (arrived to HD [facility]; no weight available) IBW/kg (Calculated) : 75.3 Heparin Dosing Weight: 80kg  Vital Signs: Temp: 97.9 F (36.6 C) (01/22 0932) Temp src: Oral (01/22 0932) BP: 118/73 mmHg (01/22 1047) Pulse Rate: 73 (01/22 1047)  Labs:  Recent Labs  09/17/13 0812 09/17/13 2049 09/18/13 0700 09/18/13 0701  HGB 10.1*  --  9.9*  --   HCT 32.4*  --  31.1*  --   PLT 167  --  175  --   APTT 37  --   --   --   LABPROT 21.5*  --  21.9*  --   INR 1.93*  --  1.98*  --   HEPARINUNFRC  --  <0.10*  --  0.15*  CREATININE 7.11*  --  4.77*  --    Estimated Creatinine Clearance: 18.5 ml/min (by C-G formula based on Cr of 4.77).  Medical History: Past Medical History  Diagnosis Date  . UGI bleed 02/12/11    on anticoagulation; EGD w/snare polypectomy-multiple polypoid lesions antrum(benign), Chronic active gastritis (NEGATIVE H pylori)  . Anemia, chronic disease   . Sepsis due to enterococcus 02/11/11  . Atrial fibrillation     coumadin  . CAD (coronary artery disease)     s/p CABGx4  . Cardiomyopathy     40-45% EF 6/12  . Diabetes mellitus   . Hypertension   . Hyperlipemia   . Gout   . S/P patent foramen ovale closure   . History of nuclear stress test 11/2011    negative myoview  . End stage kidney disease     T/TH/SAT dialyis, Dr Hinda Lenis  . Gout   . History of nuclear stress test 07/04/2010    dipyridamole; mod fixed inferolateral defect; non-diagnostic for ischemia; low risk scan   . Hyperbilirubinemia 08/10/2013   Medications:  Scheduled:  . allopurinol  100 mg Oral Daily  . amiodarone  100 mg Oral Daily  . aspirin EC  81 mg Oral Daily  . atorvastatin  20 mg  Oral q1800  . diltiazem  180 mg Oral Daily  . epoetin alfa  4,000 Units Intravenous 3 times weekly  . insulin aspart  0-9 Units Subcutaneous Q6H  . insulin glargine  5 Units Subcutaneous QHS  . metoprolol tartrate  25 mg Oral TID  . pantoprazole  40 mg Oral BID  . sevelamer carbonate  800 mg Oral TID WC  . sodium chloride  3 mL Intravenous Q12H  . traZODone  50 mg Oral QHS   Assessment: 63 yo M on chronic warfarin for Afib.  He is admitted s/p fall with plans for OR 09/19/2013.  INR 1.93 on admission.   No active bleeding.  CBC consistent with anemia of chronic disease.  Heparin bridge was started.  Heparin level below goal range x 2 checks.  Heparin will be stopped 1/23 at 0700 in preparation for OR to close wound.     Goal of Therapy:  Heparin level 0.3-0.7 units/ml Monitor platelets by anticoagulation protocol: Yes   Plan:  Increase Heparin infusion to 1550 units/hr now Recheck heparin level in 6 hrs Heparin will be stopped tomorrow at 0700 for OR procedure  [name] [name] A 09/18/2013,11:18  AM  Repeat heparin level still slightly less than goal.  Increase heparin to 1700 units/hr.   Plan heparin off at 0700 therefore will not repeat heparin level in am. F/U anticoagulation plans post-op. [name] [name], PharmD, BCPS 09/18/2013@9 :54 PM

## 2013-09-18 NOTE — Progress Notes (Signed)
Subjective: Interval History: has no complaint of nausea or vomiting. Patient also denies any difficulty in breathing. Presently has some pain of his leg but seems to be getting better..  Objective: Vital signs in last 24 hours: Temp:  [97.6 F (36.4 C)-98 F (36.7 C)] 97.8 F (36.6 C) (01/22 0530) Pulse Rate:  [89-104] 103 (01/22 0830) Resp:  [16-20] 20 (01/22 0530) BP: (116-151)/(69-94) 125/69 mmHg (01/22 0830) SpO2:  [97 %-100 %] 97 % (01/22 0530) Weight:  [96.616 kg (213 lb)] 96.616 kg (213 lb) (01/21 0957) Weight change:   Intake/Output from previous day: 01/21 0701 - 01/22 0700 In: 277.3 [P.O.:240; I.V.:37.3] Out: 250 [Urine:250] Intake/Output this shift:    General appearance: alert, cooperative and no distress Resp: clear to auscultation bilaterally Cardio: regular rate and rhythm, S1, S2 normal, no murmur, click, rub or gallop GI: soft, non-tender; bowel sounds normal; no masses,  no organomegaly Extremities: extremities normal, atraumatic, no cyanosis or edema  Lab Results:  Recent Labs  09/17/13 0812 09/18/13 0700  WBC 7.5 7.9  HGB 10.1* 9.9*  HCT 32.4* 31.1*  PLT 167 175   BMET:  Recent Labs  09/17/13 0812 09/18/13 0700  NA 145 142  K 4.6 3.6*  CL 101 100  CO2 28 27  GLUCOSE 92 91  BUN 34* 22  CREATININE 7.11* 4.77*  CALCIUM 10.2 9.6   No results found for this basename: PTH,  in the last 72 hours Iron Studies: No results found for this basename: IRON, TIBC, TRANSFERRIN, FERRITIN,  in the last 72 hours  Studies/Results: Dg Chest 2 View  09/17/2013   CLINICAL DATA:  Preoperative films.  EXAM: CHEST  2 VIEW  COMPARISON:  Single view of the chest 08/12/2013.  FINDINGS: There is cardiomegaly and vascular congestion. Right pleural effusion and basilar airspace disease seen on the prior study have markedly improved with only a small residual effusion identified. No pneumothorax.  IMPRESSION: Cardiomegaly and mild vascular congestion.  Small right  pleural effusion and basilar atelectasis are markedly improved since the prior study.   Electronically Signed   By: Inge Rise M.D.   On: 09/17/2013 09:35    I have reviewed the patient's current medications.  Assessment/Plan: Problem #1 end-stage renal disease presently patient is on dialysis. His BUN is 22 creatinine 4.7 and potassium is 3.6. Problem #2 after fibrillation: Patient is on heparin. Problem #3 history of fall and wound dehiscence of [name]. Presently being followed by surgery. Problem #4 anemia: His hemoglobin and hematocrit is low and patient is on Epogen. Problem #5 carotid disease: Status post CABG is a symptomatic. Patient also has history of GERD but replacement. Problem #6 diabetes Problem #7 metabolic bone disease : His calcium is was in acceptable range. Plan: We'll change his bath to 3k/2.5 ca bath. We'll check his basic metabolic panel, phosphorus and CBC in the morning. We'll check with surgery he heparin it discontinued due to planning to do a revision of [name] today.  LOS: 1 day   [name] [name] S 09/18/2013,8:36 AM

## 2013-09-18 NOTE — Progress Notes (Signed)
[facility] PROGRESS NOTE  [name] [name] OAC:[id number] DOB: 1951-08-21 DOA: 09/17/2013 PCP: [name], MD  Summary: 63 year old man hospitalized 07/2013 for sepsis, osteomyelitis of the right foot, underwent BKA. Today he fell on the right BKA stump resulting in rupture of wound. Because of medical complexity, medical admission was requested.  Assessment/Plan: 1. Traumatic dehiscence of right BKA wound status post mechanical fall. 2. Atrial fibrillation, stable, maintained on warfarin. Continue amiodarone, metoprolol, diltiazem. 3. End-stage renal disease, continue hemodialysis. 4. Anemia of chronic disease, stable. 5. Diabetes mellitus type 2, stable. Continue Lantus and sliding scale. 6. History coronary artery disease, CABG 2008, status post bioprosthetic mitral valve replacement 2008. Negative nuclear stress test 11/2011. 7. History of systolic congestive heart failure, last EF reportedly 40-45% June 2004.   Case discussed with Dr. Romona Curls. Anesthesia canceled the case for today because of dialysis. Plan primary wound closure 1/23. No need to reverse INR per Dr. Romona Curls, continue to hold warfarin.  Continue to hold warfarin, daily PT INR.  Continue routine hemodialysis.  Pending studies:   Wound culture  Code Status: full code DVT prophylaxis: on heparin Family Communication: none present Disposition Plan: home when improved  [name], MD  [facility]  Pager [phone] If 7PM-7AM, please contact night-coverage at [email or web address], password [facility] 09/18/2013, 11:49 AM  LOS: 1 day   Consultants:  General surgery  Nephrology  Procedures:  Routine hemodialysis  Antibiotics:    HPI/Subjective: No issues overnight. Received hemodialysis today. Per surgery anesthesia was canceled the case and reschedule for tomorrow  Objective: Filed Vitals:   09/18/13 0900 09/18/13 0915 09/18/13 0932 09/18/13 1047  BP: 112/71 110/73 122/69 118/73  Pulse:  89 88 96 73  Temp:   97.9 F (36.6 C)   TempSrc:   Oral   Resp:   20   Height:      Weight:      SpO2:        Intake/Output Summary (Last 24 hours) at 09/18/13 1149 Last data filed at 09/18/13 0915  Gross per 24 hour  Intake 277.33 ml  Output   2250 ml  Net -1972.67 ml     Filed Weights   09/17/13 0736 09/17/13 0957  Weight: 105.235 kg (232 lb) 96.616 kg (213 lb)    Exam:   Afebrile, vital signs stable.  General: Appears calm and comfortable.  Cardiovascular: Regular rate and rhythm. No murmur, rub or gallop.  Respiratory: Clear to auscultation bilaterally. No wheezes, rales or rhonchi. Normal respiratory effort.  Psychiatric: Grossly normal mood and affect. Speech fluent and appropriate.  Data Reviewed:  Basic metabolic panel unremarkable  Capillary blood sugars stable  Hemoglobin stable at 9.9  INR 1.98  Scheduled Meds: . allopurinol  100 mg Oral Daily  . amiodarone  100 mg Oral Daily  . aspirin EC  81 mg Oral Daily  . atorvastatin  20 mg Oral q1800  . diltiazem  180 mg Oral Daily  . epoetin alfa  4,000 Units Intravenous 3 times weekly  . insulin aspart  0-9 Units Subcutaneous Q6H  . insulin glargine  5 Units Subcutaneous QHS  . metoprolol tartrate  25 mg Oral TID  . pantoprazole  40 mg Oral BID  . sevelamer carbonate  800 mg Oral TID WC  . sodium chloride  3 mL Intravenous Q12H  . traZODone  50 mg Oral QHS   Continuous Infusions: . heparin 1,300 Units/hr (09/18/13 1057)    Principal Problem:   Traumatic wound dehiscence Active Problems:  Atrial fibrillation   Shadden term (current) use of anticoagulants   CAD (coronary artery disease)   S/P CABG (coronary artery bypass graft)   H/O mitral valve replacement   ESRD (end stage renal disease) on dialysis   DM type 2 causing ESRD   Anemia of chronic disease   Fall   Time spent 25 minutes

## 2013-09-19 ENCOUNTER — Encounter (HOSPITAL_COMMUNITY): Payer: Medicare Other | Admitting: Anesthesiology

## 2013-09-19 ENCOUNTER — Encounter (HOSPITAL_COMMUNITY): Payer: Self-pay | Admitting: *Deleted

## 2013-09-19 ENCOUNTER — Inpatient Hospital Stay (HOSPITAL_COMMUNITY): Payer: Medicare Other | Admitting: Anesthesiology

## 2013-09-19 ENCOUNTER — Encounter (HOSPITAL_COMMUNITY)
Admission: EM | Disposition: A | Discharge status: Home Health | Payer: Self-pay | Source: Home / Self Care | Attending: Family Medicine

## 2013-09-19 DIAGNOSIS — I4891 Unspecified atrial fibrillation: Secondary | ICD-10-CM

## 2013-09-19 DIAGNOSIS — Z954 Presence of other heart-valve replacement: Secondary | ICD-10-CM

## 2013-09-19 HISTORY — PX: STUMP REVISION: SHX6102

## 2013-09-19 LAB — HEPARIN LEVEL (UNFRACTIONATED): Heparin Unfractionated: <0.1 IU/mL — ABNORMAL LOW (ref 0.30–0.70)

## 2013-09-19 LAB — GLUCOSE, CAPILLARY
GLUCOSE-CAPILLARY: 84 mg/dL (ref 70–99)
GLUCOSE-CAPILLARY: 118 mg/dL — AB (ref 70–99)
Glucose-Capillary: 92 mg/dL (ref 70–99)
Glucose-Capillary: 88 mg/dL (ref 70–99)
Glucose-Capillary: 136 mg/dL — ABNORMAL HIGH (ref 70–99)

## 2013-09-19 LAB — PROTIME-INR
INR: 1.62 — ABNORMAL HIGH (ref 0.00–1.49)
Prothrombin Time: 18.8 seconds — ABNORMAL HIGH (ref 11.6–15.2)

## 2013-09-19 LAB — HEMOGLOBIN AND HEMATOCRIT, BLOOD
HEMATOCRIT: 31.3 % — AB (ref 39.0–52.0)
Hemoglobin: 9.8 g/dL — ABNORMAL LOW (ref 13.0–17.0)

## 2013-09-19 SURGERY — REVISION, AMPUTATION SITE
Anesthesia: Monitor Anesthesia Care | Site: Leg Lower | Laterality: Right

## 2013-09-19 MED ORDER — SODIUM CHLORIDE 0.9 % IV SOLN: 100.0000 mL | INTRAVENOUS | Status: DC | PRN

## 2013-09-19 MED ORDER — LIDOCAINE HCL (PF) 1 % IJ SOLN
INTRAMUSCULAR | Status: DC | PRN
  Administered 2013-09-19: 5 mL

## 2013-09-19 MED ORDER — HEPARIN SODIUM (PORCINE) 1000 UNIT/ML DIALYSIS
1000.0000 [IU] | INTRAMUSCULAR | Status: DC | PRN
  Filled 2013-09-19: qty 1

## 2013-09-19 MED ORDER — LIDOCAINE HCL (PF) 1 % IJ SOLN
INTRAMUSCULAR | Status: AC
  Filled 2013-09-19: qty 30

## 2013-09-19 MED ORDER — LIDOCAINE HCL (PF) 1 % IJ SOLN
INTRAMUSCULAR | Status: AC
  Filled 2013-09-19: qty 5

## 2013-09-19 MED ORDER — STERILE WATER FOR IRRIGATION IR SOLN
Status: DC | PRN
  Administered 2013-09-19 (×2): 1000 mL

## 2013-09-19 MED ORDER — 0.9 % SODIUM CHLORIDE (POUR BTL) OPTIME
TOPICAL | Status: DC | PRN
  Administered 2013-09-19: 1000 mL

## 2013-09-19 MED ORDER — PROPOFOL INFUSION 10 MG/ML OPTIME
INTRAVENOUS | Status: DC | PRN
  Administered 2013-09-19: 100 ug/kg/min via INTRAVENOUS
  Administered 2013-09-19: 40 ug/kg/min via INTRAVENOUS
  Administered 2013-09-19: 50 ug/kg/min via INTRAVENOUS
  Administered 2013-09-19: 60 ug/kg/min via INTRAVENOUS

## 2013-09-19 MED ORDER — CEFAZOLIN SODIUM 1-5 GM-% IV SOLN
1.0000 g | Freq: Four times a day (QID) | INTRAVENOUS | Status: DC
  Administered 2013-09-19 (×2): 1 g via INTRAVENOUS
  Filled 2013-09-19 (×3): qty 50

## 2013-09-19 MED ORDER — FENTANYL CITRATE 0.05 MG/ML IJ SOLN
INTRAMUSCULAR | Status: DC | PRN
Start: 2013-09-19 — End: 2013-09-19
  Administered 2013-09-19 (×2): 25 ug via INTRAVENOUS
  Administered 2013-09-19 (×2): 12.5 ug via INTRAVENOUS

## 2013-09-19 MED ORDER — FENTANYL CITRATE 0.05 MG/ML IJ SOLN
INTRAMUSCULAR | Status: AC
  Filled 2013-09-19: qty 2

## 2013-09-19 MED ORDER — LIDOCAINE HCL (CARDIAC) 10 MG/ML IV SOLN
INTRAVENOUS | Status: DC | PRN
  Administered 2013-09-19: 10 mg via INTRAVENOUS

## 2013-09-19 MED ORDER — CEFAZOLIN SODIUM 1-5 GM-% IV SOLN
INTRAVENOUS | Status: AC
  Filled 2013-09-19: qty 50

## 2013-09-19 MED ORDER — PIPERACILLIN-TAZOBACTAM IN DEX 2-0.25 GM/50ML IV SOLN
INTRAVENOUS | Status: AC
  Filled 2013-09-19: qty 100

## 2013-09-19 MED ORDER — MIDAZOLAM HCL 2 MG/2ML IJ SOLN
INTRAMUSCULAR | Status: AC
  Filled 2013-09-19: qty 2

## 2013-09-19 MED ORDER — MIDAZOLAM HCL 2 MG/2ML IJ SOLN
1.0000 mg | INTRAMUSCULAR | Status: DC | PRN
  Administered 2013-09-19: 2 mg via INTRAVENOUS

## 2013-09-19 MED ORDER — STERILE WATER FOR IRRIGATION IR SOLN
Status: DC | PRN
  Administered 2013-09-19 (×3): 1000 mL

## 2013-09-19 MED ORDER — CEFAZOLIN (ANCEF) 1 G IV SOLR
1.0000 g | INTRAVENOUS | Status: DC
  Filled 2013-09-19: qty 1

## 2013-09-19 MED ORDER — PIPERACILLIN-TAZOBACTAM IN DEX 2-0.25 GM/50ML IV SOLN
2.2500 g | Freq: Three times a day (TID) | INTRAVENOUS | Status: DC
  Administered 2013-09-19 – 2013-09-22 (×9): 2.25 g via INTRAVENOUS
  Filled 2013-09-19 (×12): qty 50

## 2013-09-19 MED ORDER — PROPOFOL 10 MG/ML IV EMUL
INTRAVENOUS | Status: AC
  Filled 2013-09-19: qty 20

## 2013-09-19 MED ORDER — PROPOFOL 10 MG/ML IV EMUL
INTRAVENOUS | Status: AC
Start: 2013-09-19 — End: 2013-09-19
  Filled 2013-09-19: qty 20

## 2013-09-19 MED ORDER — SODIUM CHLORIDE 0.9 % IV SOLN
INTRAVENOUS | Status: DC
Start: 2013-09-19 — End: 2013-09-19
  Administered 2013-09-19: 08:00:00 via INTRAVENOUS

## 2013-09-19 MED ORDER — INSULIN ASPART 100 UNIT/ML ~~LOC~~ SOLN
0.0000 [IU] | Freq: Three times a day (TID) | SUBCUTANEOUS | Status: DC
  Administered 2013-09-20 – 2013-09-23 (×5): 1 [IU] via SUBCUTANEOUS

## 2013-09-19 MED ORDER — HEPARIN (PORCINE) IN NACL 100-0.45 UNIT/ML-% IJ SOLN
1900.0000 [IU]/h | INTRAMUSCULAR | Status: DC
  Administered 2013-09-19: 1700 [IU]/h via INTRAVENOUS
  Administered 2013-09-20 – 2013-09-22 (×5): 1900 [IU]/h via INTRAVENOUS
  Filled 2013-09-19 (×8): qty 250

## 2013-09-19 MED ORDER — SODIUM CHLORIDE 0.9 % IR SOLN
Status: DC | PRN
  Administered 2013-09-19: 3000 mL

## 2013-09-19 MED ORDER — LACTATED RINGERS IV SOLN: INTRAVENOUS | Status: DC

## 2013-09-19 MED ORDER — VANCOMYCIN HCL 10 G IV SOLR
1500.0000 mg | Freq: Once | INTRAVENOUS | Status: AC
  Administered 2013-09-19: 1500 mg via INTRAVENOUS
  Filled 2013-09-19: qty 1500

## 2013-09-19 MED ORDER — HEMOSTATIC AGENTS (NO CHARGE) OPTIME
TOPICAL | Status: DC | PRN
  Administered 2013-09-19 (×2): 2 via TOPICAL

## 2013-09-19 MED ORDER — ALTEPLASE 2 MG IJ SOLR: 2.0000 mg | Freq: Once | INTRAMUSCULAR | Status: AC | PRN

## 2013-09-19 MED ORDER — FENTANYL CITRATE 0.05 MG/ML IJ SOLN
25.0000 ug | INTRAMUSCULAR | Status: DC | PRN
  Administered 2013-09-19 (×2): 25 ug via INTRAVENOUS
  Filled 2013-09-19: qty 2

## 2013-09-19 MED ORDER — FENTANYL CITRATE 0.05 MG/ML IJ SOLN
25.0000 ug | INTRAMUSCULAR | Status: DC
  Administered 2013-09-19: 25 ug via INTRAVENOUS

## 2013-09-19 MED ORDER — ONDANSETRON HCL 4 MG/2ML IJ SOLN: 4.0000 mg | Freq: Once | INTRAMUSCULAR | Status: DC | PRN

## 2013-09-19 SURGICAL SUPPLY — 48 items
BAG HAMPER (MISCELLANEOUS) ×3 IMPLANT
BANDAGE CONFORM 2  STR LF (GAUZE/BANDAGES/DRESSINGS) ×3 IMPLANT
BANDAGE ELASTIC 6 VELCRO NS (GAUZE/BANDAGES/DRESSINGS) ×12 IMPLANT
BANDAGE GAUZE ELAST BULKY 4 IN (GAUZE/BANDAGES/DRESSINGS) ×6 IMPLANT
BLADE RECIPROCATING TAPERED (BLADE) ×3 IMPLANT
BLADE SURG SZ10 CARB STEEL (BLADE) ×9 IMPLANT
CLOTH BEACON ORANGE TIMEOUT ST (SAFETY) ×3 IMPLANT
COVER LIGHT HANDLE STERIS (MISCELLANEOUS) ×6 IMPLANT
ELECT REM PT RETURN 9FT ADLT (ELECTROSURGICAL) ×3
ELECTRODE REM PT RTRN 9FT ADLT (ELECTROSURGICAL) ×1 IMPLANT
EVACUATOR 1/8 PVC DRAIN (DRAIN) ×3 IMPLANT
GAUZE XEROFORM 5X9 LF (GAUZE/BANDAGES/DRESSINGS) ×3 IMPLANT
GLOVE BIOGEL PI IND STRL 7.0 (GLOVE) ×1 IMPLANT
GLOVE BIOGEL PI IND STRL 7.5 (GLOVE) ×2 IMPLANT
GLOVE BIOGEL PI INDICATOR 7.0 (GLOVE) ×2
GLOVE BIOGEL PI INDICATOR 7.5 (GLOVE) ×4
GLOVE ECLIPSE 7.0 STRL STRAW (GLOVE) ×3 IMPLANT
GLOVE SKINSENSE NS SZ7.0 (GLOVE) ×2
GLOVE SKINSENSE STRL SZ7.0 (GLOVE) ×1 IMPLANT
GOWN STRL REUS W/TWL LRG LVL3 (GOWN DISPOSABLE) ×6 IMPLANT
HANDPIECE INTERPULSE COAX TIP (DISPOSABLE) ×2
HEMOSTAT SNOW SURGICEL 2X4 (HEMOSTASIS) ×6 IMPLANT
HEMOSTAT SURGICEL 4X8 (HEMOSTASIS) ×6 IMPLANT
IV NS IRRIG 3000ML ARTHROMATIC (IV SOLUTION) ×3 IMPLANT
KIT ROOM TURNOVER APOR (KITS) ×3 IMPLANT
MANIFOLD NEPTUNE II (INSTRUMENTS) ×3 IMPLANT
MARKER SKIN DUAL TIP RULER LAB (MISCELLANEOUS) ×3 IMPLANT
NS IRRIG 1000ML POUR BTL (IV SOLUTION) ×3 IMPLANT
PACK BASIC LIMB (CUSTOM PROCEDURE TRAY) ×3 IMPLANT
PAD ABD 5X9 TENDERSORB (GAUZE/BANDAGES/DRESSINGS) ×18 IMPLANT
PAD ARMBOARD 7.5X6 YLW CONV (MISCELLANEOUS) ×3 IMPLANT
SET BASIN LINEN APH (SET/KITS/TRAYS/PACK) ×3 IMPLANT
SET HNDPC FAN SPRY TIP SCT (DISPOSABLE) ×1 IMPLANT
SPONGE GAUZE 4X4 12PLY (GAUZE/BANDAGES/DRESSINGS) IMPLANT
SPONGE LAP 18X18 X RAY DECT (DISPOSABLE) ×12 IMPLANT
SUT BONE WAX W31G (SUTURE) ×3 IMPLANT
SUT ETHILON 3 0 FSL (SUTURE) ×21 IMPLANT
SUT ETHILON 4 0 PS 2 18 (SUTURE) ×15 IMPLANT
SUT VIC AB 3-0 SH 27 (SUTURE) ×2
SUT VIC AB 3-0 SH 27X BRD (SUTURE) ×1 IMPLANT
SUT VICRYL 0 UR6 27IN ABS (SUTURE) ×9 IMPLANT
SWAB CULTURE LIQ STUART DBL (MISCELLANEOUS) IMPLANT
SYR BULB IRRIGATION 50ML (SYRINGE) ×6 IMPLANT
SYSTEM CHEST DRAIN TLS 7FR (DRAIN) IMPLANT
TOWEL OR 17X26 4PK STRL BLUE (TOWEL DISPOSABLE) ×6 IMPLANT
TUBE ANAEROBIC PORT A CUL  W/M (MISCELLANEOUS) IMPLANT
WATER STERILE IRR 1000ML POUR (IV SOLUTION) ×15 IMPLANT
YANKAUER SUCT BULB TIP NO VENT (SUCTIONS) ×3 IMPLANT

## 2013-09-19 NOTE — Progress Notes (Signed)
ANTIBIOTIC CONSULT NOTE - INITIAL  Pharmacy Consult for Vancomycin & Zosyn Indication: Wound infection  Allergies  Allergen Reactions  . Bacitracin Other (See Comments)    unknown  . Norvasc [Amlodipine Besylate] Other (See Comments)    unknown    Patient Measurements: Height: 5\' 11"  (180.3 cm) Weight: 216 lb 14.9 oz (98.4 kg) IBW/kg (Calculated) : 75.3 Adjusted Body Weight:   Vital Signs: Temp: 97.9 F (36.6 C) (01/23 1033) Temp src: Oral (01/23 1033) BP: 138/82 mmHg (01/23 1800) Pulse Rate: 97 (01/23 1330) Intake/Output from previous day: 01/22 0701 - 01/23 0700 In: 756.3 [P.O.:480; I.V.:276.3] Out: 2000  Intake/Output from this shift:    Labs:  Recent Labs  09/17/13 0812 09/18/13 0700 09/19/13 1325  WBC 7.5 7.9  --   HGB 10.1* 9.9* 9.8*  PLT 167 175  --   CREATININE 7.11* 4.77*  --    Estimated Creatinine Clearance: 18.7 ml/min (by C-G formula based on Cr of 4.77). No results found for this basename: VANCOTROUGH, Corlis Leak, VANCORANDOM, [place], GENTPEAK, GENTRANDOM, TOBRATROUGH, TOBRAPEAK, TOBRARND, AMIKACINPEAK, AMIKACINTROU, AMIKACIN,  in the last 72 hours   Microbiology: Recent Results (from the past 720 hour(s))  WOUND CULTURE     Status: None   Collection Time    09/17/13  9:00 AM      Result Value Range Status   Specimen Description WOUND LEG   Final   Special Requests NONE   Final   Gram Stain     Final   Value: RARE WBC PRESENT, PREDOMINANTLY PMN     NO SQUAMOUS EPITHELIAL CELLS SEEN     NO ORGANISMS SEEN     Performed at [facility]   Culture     Final   Value: FEW STAPHYLOCOCCUS AUREUS     Note: RIFAMPIN AND GENTAMICIN SHOULD NOT BE USED AS SINGLE DRUGS FOR TREATMENT OF STAPH INFECTIONS.     FEW GRAM NEGATIVE RODS     Performed at [facility]   Report Status PENDING   Incomplete  MRSA PCR SCREENING     Status: Abnormal   Collection Time    09/18/13 12:10 AM      Result Value Range Status   MRSA by PCR POSITIVE  (*) NEGATIVE Final   Comment:            The GeneXpert MRSA Assay (FDA     approved for NASAL specimens     only), is one component of a     comprehensive MRSA colonization     surveillance program. It is not     intended to diagnose MRSA     infection nor to guide or     monitor treatment for     MRSA infections.     RESULT CALLED TO, READ BACK BY AND VERIFIED WITH:     Daun Peacock AT 0203 ON [id number] BY FORSYTH K    Medical History: Past Medical History  Diagnosis Date  . UGI bleed 02/12/11    on anticoagulation; EGD w/snare polypectomy-multiple polypoid lesions antrum(benign), Chronic active gastritis (NEGATIVE H pylori)  . Anemia, chronic disease   . Sepsis due to enterococcus 02/11/11  . Atrial fibrillation     coumadin  . CAD (coronary artery disease)     s/p CABGx4  . Cardiomyopathy     40-45% EF 6/12  . Diabetes mellitus   . Hypertension   . Hyperlipemia   . Gout   . S/P patent foramen ovale closure   .  History of nuclear stress test 11/2011    negative myoview  . End stage kidney disease     T/TH/SAT dialyis, Dr Hinda Lenis  . Gout   . History of nuclear stress test 07/04/2010    dipyridamole; mod fixed inferolateral defect; non-diagnostic for ischemia; low risk scan   . Hyperbilirubinemia 08/10/2013    Medications:  Scheduled:  . allopurinol  100 mg Oral Daily  . amiodarone  100 mg Oral Daily  . atorvastatin  20 mg Oral q1800  . diltiazem  180 mg Oral Daily  . epoetin alfa  4,000 Units Intravenous 3 times weekly  . [START ON 09/20/2013] insulin aspart  0-9 Units Subcutaneous TID WC  . metoprolol tartrate  25 mg Oral TID  . pantoprazole  40 mg Oral BID  . piperacillin-tazobactam (ZOSYN)  IV  2.25 g Intravenous Q8H  . sevelamer carbonate  800 mg Oral TID WC  . sodium chloride  3 mL Intravenous Q12H  . traZODone  50 mg Oral QHS  . vancomycin  1,500 mg Intravenous Once   Assessment: Traumatic dehiscence of right BKA wound post mechanical fall. Wound culture with  staph aureus and gram negative rods Cultures pending Dialysis patient  Goal of Therapy:  Vancomycin trough level 15-20 mcg/ml  Plan:  Vancomycin 1500 mg IV x 1 dose F/U dialysis days for further Vancomycin dosing Zosyn 2.5 GM IV every 8 hours Labs per protocol   Abner Greenspan, Gene Colee Bennett 09/19/2013,7:36 PM

## 2013-09-19 NOTE — Progress Notes (Signed)
89 yr. Old African American S/P R BKA in Dec. Who fell 2 days ago and dehisced.  He has multiple medical problems which were dealt with prior to surgery and is now cleared for stump revision.   He was dialyzed yesterday and has been on a heparin bridge and his INR is acceptable today Procedure discussed and informed consent obtained.  Filed Vitals:   09/19/13 0647  BP: 119/76  Pulse: 97  Temp: 98.7 F (37.1 C)  Resp: 20

## 2013-09-19 NOTE — Anesthesia Procedure Notes (Signed)
Procedure Name: MAC Date/Time: 09/19/2013 7:47 AM Performed by: Vista Deck Pre-anesthesia Checklist: Patient identified, Emergency Drugs available, Suction available, Timeout performed and Patient being monitored Patient Re-evaluated:Patient Re-evaluated prior to inductionOxygen Delivery Method: Non-rebreather mask

## 2013-09-19 NOTE — Progress Notes (Signed)
Post OP Check  Filed Vitals:   09/19/13 1430  BP: 135/84  Pulse:   Temp:   Resp: 16  HR 118, Temp Dressings dry and in tact.  Splint is not hard yet(?).  JP  30 cc bloody fluid.  Minimal pain.  Doing very well post op.clinically without any voiced complaints.  No shortness of breath or chest pain.   Continue to follow in ICU setting.  Anticoagulation as per Med service.

## 2013-09-19 NOTE — Progress Notes (Signed)
[place] for heparin Indication: atrial fibrillation  Allergies  Allergen Reactions  . Bacitracin Other (See Comments)    unknown  . Norvasc [Amlodipine Besylate] Other (See Comments)    unknown   Patient Measurements: Height: 5\' 11"  (180.3 cm) Weight: 216 lb 14.9 oz (98.4 kg) IBW/kg (Calculated) : 75.3 Heparin Dosing Weight: 80kg  Vital Signs: Temp: 97.9 F (36.6 C) (01/23 1033) Temp src: Oral (01/23 1033) BP: 140/80 mmHg (01/23 1615) Pulse Rate: 97 (01/23 1330)  Labs:  Recent Labs  09/17/13 0812 09/17/13 2049 09/18/13 0700 09/18/13 0701 09/18/13 1738 09/19/13 0446 09/19/13 1325  HGB 10.1*  --  9.9*  --   --   --  9.8*  HCT 32.4*  --  31.1*  --   --   --  31.3*  PLT 167  --  175  --   --   --   --   APTT 37  --   --   --   --   --   --   LABPROT 21.5*  --  21.9*  --   --  18.8*  --   INR 1.93*  --  1.98*  --   --  1.62*  --   HEPARINUNFRC  --  <0.10*  --  0.15* 0.27*  --   --   CREATININE 7.11*  --  4.77*  --   --   --   --    Estimated Creatinine Clearance: 18.7 ml/min (by C-G formula based on Cr of 4.77).  Medical History: Past Medical History  Diagnosis Date  . UGI bleed 02/12/11    on anticoagulation; EGD w/snare polypectomy-multiple polypoid lesions antrum(benign), Chronic active gastritis (NEGATIVE H pylori)  . Anemia, chronic disease   . Sepsis due to enterococcus 02/11/11  . Atrial fibrillation     coumadin  . CAD (coronary artery disease)     s/p CABGx4  . Cardiomyopathy     40-45% EF 6/12  . Diabetes mellitus   . Hypertension   . Hyperlipemia   . Gout   . S/P patent foramen ovale closure   . History of nuclear stress test 11/2011    negative myoview  . End stage kidney disease     T/TH/SAT dialyis, Dr Hinda Lenis  . Gout   . History of nuclear stress test 07/04/2010    dipyridamole; mod fixed inferolateral defect; non-diagnostic for ischemia; low risk scan   . Hyperbilirubinemia 08/10/2013    Medications:  Scheduled:  . allopurinol  100 mg Oral Daily  . amiodarone  100 mg Oral Daily  . atorvastatin  20 mg Oral q1800  .  ceFAZolin (ANCEF) IV  1 g Intravenous Q6H  . diltiazem  180 mg Oral Daily  . epoetin alfa  4,000 Units Intravenous 3 times weekly  . metoprolol tartrate  25 mg Oral TID  . pantoprazole  40 mg Oral BID  . sevelamer carbonate  800 mg Oral TID WC  . sodium chloride  3 mL Intravenous Q12H  . traZODone  50 mg Oral QHS   Assessment: 63 yo M on chronic warfarin for Afib.  He is admitted s/p fall with plans for OR 09/19/2013.  INR 1.93 on admission.   No active bleeding.  CBC consistent with anemia of chronic disease.  Heparin bridge was started.  Heparin will be stopped 1/23 at 0700 in preparation for OR to close wound.   Heparin restarted after surgery   Goal of Therapy:  Heparin level 0.3-0.7 units/ml Monitor platelets by anticoagulation protocol: Yes   Plan:  Restart heparin infusion at 1700 units/hr Recheck heparin level in 6 hours and daily F/U anticoagulation plan    Abner Greenspan, Moriyah Byington Bennett 09/19/2013,4:31 PM

## 2013-09-19 NOTE — Progress Notes (Signed)
[facility] PROGRESS NOTE  [name] [name] WLN:[id number] DOB: [birth year] DOA: 09/17/2013 PCP: [name], MD  Summary: 63 year old man hospitalized 07/2013 for sepsis, osteomyelitis of the right foot, underwent BKA. Today he fell on the right BKA stump resulting in rupture of wound. Because of medical complexity, medical admission was requested.  Assessment/Plan: 1. Traumatic dehiscence of right BKA wound status post mechanical fall. Wound culture obtained in the emergency department now with few staph aureus and a few gram-negative rods. Preliminary result only available after surgery. Significance unclear AS wound was noted to be very clean. 2. Atrial fibrillation. Was not given morning dose of amiodarone, metoprolol, diltiazem. Resume heparin. 3. End-stage renal disease, continue hemodialysis. 4. Anemia of chronic disease, stable. 5. Diabetes mellitus type 2, stable. Continue sliding scale. 6. History coronary artery disease, CABG 2008, status post bioprosthetic mitral valve replacement 2008. Negative nuclear stress test 11/2011. 7. History of systolic congestive heart failure, last EF reportedly 40-45% June 2004. Appears compensated.   Start vancomycin, Zosyn pending culture results.   Resume amiodarone, diltiazem   resume heparin infusion now, resume warfarin when cleared by surgery   Resume Lantus 1/24 based on dietary intake  CBC, Basic metabolic panel in the morning  Hemodialysis per nephrology  Pending studies:   Wound culture--few staph aureus and few gram-negative rods  Code Status: full code DVT prophylaxis: on heparin Family Communication: none present Disposition Plan: home when improved  [name], MD  [facility]  Pager [phone] If 7PM-7AM, please contact night-coverage at [email or web address], password [facility] 09/19/2013, 6:16 PM  LOS: 2 days   Consultants:  General surgery  Nephrology  Procedures:  Routine hemodialysis  1/23 STUMP  REVISION   Antibiotics:    HPI/Subjective: Now status post surgery which went well per Dr. Romona Curls. Patient without complaint at this point.  Objective: Filed Vitals:   09/19/13 1715 09/19/13 1730 09/19/13 1745 09/19/13 1800  BP: 139/65 135/63 129/77 138/82  Pulse:      Temp:      TempSrc:      Resp: 21 16 25 23   Height:      Weight:      SpO2:        Intake/Output Summary (Last 24 hours) at 09/19/13 1816 Last data filed at 09/19/13 1813  Gross per 24 hour  Intake 306.23 ml  Output    345 ml  Net -38.77 ml     Filed Weights   09/17/13 0736 09/17/13 0957 09/19/13 0620  Weight: 105.235 kg (232 lb) 96.616 kg (213 lb) 98.4 kg (216 lb 14.9 oz)    Exam:   Afebrile, vital signs stable.  General: Appears comfortable, calm. Speech fluent and clear.  Cardiovascular: Regular rate and rhythm. No murmur, rub or gallop.  Respiratory: Clear to auscultation bilaterally. No wheezes, rales or rhonchi. Normal respiratory effort.  Psychiatric: Grossly normal mood and affect. Speech fluent and appropriate.  Data Reviewed:  Capillary blood sugars stable, no hypoglycemia  Hemoglobin stable 9.8.  INR 1.62  Scheduled Meds: . allopurinol  100 mg Oral Daily  . amiodarone  100 mg Oral Daily  . atorvastatin  20 mg Oral q1800  .  ceFAZolin (ANCEF) IV  1 g Intravenous Q6H  . diltiazem  180 mg Oral Daily  . epoetin alfa  4,000 Units Intravenous 3 times weekly  . metoprolol tartrate  25 mg Oral TID  . pantoprazole  40 mg Oral BID  . sevelamer carbonate  800 mg Oral TID WC  . sodium chloride  3 mL Intravenous Q12H  . traZODone  50 mg Oral QHS   Continuous Infusions: . heparin 1,700 Units/hr (09/19/13 1638)    Principal Problem:   Traumatic wound dehiscence Active Problems:   Atrial fibrillation   Viereck term (current) use of anticoagulants   CAD (coronary artery disease)   S/P CABG (coronary artery bypass graft)   H/O mitral valve replacement   ESRD (end stage renal  disease) on dialysis   DM type 2 causing ESRD   Anemia of chronic disease   Fall   Time spent 25 minutes

## 2013-09-19 NOTE — OR Nursing (Signed)
Rest of Fentynl   Given to [employer]

## 2013-09-19 NOTE — Anesthesia Preprocedure Evaluation (Signed)
Anesthesia Evaluation  Patient identified by MRN, date of birth, ID band Patient awake    Reviewed: Allergy & Precautions, H&P , NPO status , Patient's Chart, lab work & pertinent test results, reviewed documented beta blocker date and time   Airway Mallampati: II TM Distance: >3 FB Neck ROM: Full    Dental  (+) Partial Upper and Partial Lower   Pulmonary former smoker,  breath sounds clear to auscultation        Cardiovascular hypertension, + CAD, + Past MI, + CABG, + Peripheral Vascular Disease and +CHF + dysrhythmias Atrial Fibrillation + Valvular Problems/Murmurs (MVR) MR Rhythm:Irregular Rate:Normal     Neuro/Psych    GI/Hepatic GERD-  Controlled and Medicated,  Endo/Other  diabetes, Type 2  Renal/GU ESRFRenal disease     Musculoskeletal   Abdominal   Peds  Hematology  (+) Blood dyscrasia, anemia ,   Anesthesia Other Findings   Reproductive/Obstetrics                           Anesthesia Physical Anesthesia Plan  ASA: IV  Anesthesia Plan: MAC   Post-op Pain Management:    Induction: Intravenous  Airway Management Planned: Simple Face Mask  Additional Equipment:   Intra-op Plan:   Post-operative Plan:   Informed Consent: I have reviewed the patients History and Physical, chart, labs and discussed the procedure including the risks, benefits and alternatives for the proposed anesthesia with the patient or authorized representative who has indicated his/her understanding and acceptance.     Plan Discussed with:   Anesthesia Plan Comments:         Anesthesia Quick Evaluation

## 2013-09-19 NOTE — Brief Op Note (Signed)
09/17/2013 - 09/19/2013  10:40 AM  PATIENT:  [name] [name]  63 y.o. male  PRE-OPERATIVE DIAGNOSIS:  wound dehiscence right lower extremity  POST-OPERATIVE DIAGNOSIS:  wound dehiscence right lower extremity  PROCEDURE:  Procedure(s): STUMP REVISION (Right)  SURGEON:  Surgeon(s) and Role:    * Scherry Ran, MD - Primary  PHYSICIAN ASSISTANT:   ASSISTANTS: none   ANESTHESIA:   IV sedation  EBL:  Total I/O In: 250 [I.V.:250] Out: 200 [Blood:200]  BLOOD ADMINISTERED:none  DRAINS: (placed in right stump.) Jackson-Pratt drain(s) with closed bulb suction in the Right BKA stump.   LOCAL MEDICATIONS USED:  MARCAINE     SPECIMEN:  Source of Specimen:  Stump tissue bone muscle terndon and skin and sub cut. tissue.  DISPOSITION OF SPECIMEN:  N/A  COUNTS:  YES  TOURNIQUET:  * No tourniquets in log *  DICTATION: .Other Dictation: Dictation Number OR dictation #  [id number].  PLAN OF CARE: Admit to inpatient   PATIENT DISPOSITION:  ICU - extubated and stable.   Delay start of Pharmacological VTE agent (>24hrs) due to surgical blood loss or risk of bleeding: not applicable

## 2013-09-19 NOTE — Progress Notes (Signed)
Heparin drip stopped per order.

## 2013-09-19 NOTE — Anesthesia Postprocedure Evaluation (Signed)
Anesthesia Post Note  Patient: [name] [name] Reha  Procedure(s) Performed: Procedure(s) (LRB): IRRIGATION AND DEBRIDEMENT WOUND (Right)  Anesthesia type: MAC  Patient location: PACU  Post pain: Pain level controlled  Post assessment: Post-op Vital signs reviewed, Patient's Cardiovascular Status Stable, Respiratory Function Stable, Patent Airway, No signs of Nausea or vomiting and Pain level controlled  Last Vitals:  Filed Vitals:   09/19/13 1033  BP: 105/69  Pulse: 101  Temp: 36.6 C  Resp: 17    Post vital signs: Reviewed and stable  Level of consciousness: awake and alert   Complications: No apparent anesthesia complications

## 2013-09-19 NOTE — Transfer of Care (Signed)
Immediate Anesthesia Transfer of Care Note  Patient: [name] [name]  Procedure(s) Performed: Procedure(s) (LRB): IRRIGATION AND DEBRIDEMENT WOUND (Right)  Patient Location: PACU  Anesthesia Type: MAC  Level of Consciousness: awake  Airway & Oxygen Therapy: Patient Spontanous Breathing. Non rebreather.  Post-op Assessment: Report given to PACU RN, Post -op Vital signs reviewed and stable and Patient moving all extremities  Post vital signs: Reviewed and stable  Complications: No apparent anesthesia complications . Blood Glucose 84

## 2013-09-19 NOTE — Progress Notes (Signed)
Subjective: Interval History: Just came from surgery.  Patient also denies any difficulty in breathing.  Objective: Vital signs in last 24 hours: Temp:  [97.9 F (36.6 C)-99.7 F (37.6 C)] 97.9 F (36.6 C) (01/23 1033) Pulse Rate:  [93-108] 94 (01/23 1130) Resp:  [15-26] 15 (01/23 1130) BP: (100-122)/(62-79) 122/74 mmHg (01/23 1130) SpO2:  [91 %-100 %] 94 % (01/23 1130) Weight:  [98.4 kg (216 lb 14.9 oz)] 98.4 kg (216 lb 14.9 oz) (01/23 0620) Weight change: -6.835 kg (-15 lb 1.1 oz)  Intake/Output from previous day: 01/22 0701 - 01/23 0700 In: 756.3 [P.O.:480; I.V.:276.3] Out: 2000  Intake/Output this shift: Total I/O In: 250 [I.V.:250] Out: 200 [Blood:200]  General appearance: alert, cooperative and no distress Resp: clear to auscultation bilaterally Cardio: regular rate and rhythm, S1, S2 normal, no murmur, click, rub or gallop GI: soft, non-tender; bowel sounds normal; no masses,  no organomegaly Extremities: extremities normal, atraumatic, no cyanosis or edema  Lab Results:  Recent Labs  09/17/13 0812 09/18/13 0700  WBC 7.5 7.9  HGB 10.1* 9.9*  HCT 32.4* 31.1*  PLT 167 175   BMET:   Recent Labs  09/17/13 0812 09/18/13 0700  NA 145 142  K 4.6 3.6*  CL 101 100  CO2 28 27  GLUCOSE 92 91  BUN 34* 22  CREATININE 7.11* 4.77*  CALCIUM 10.2 9.6   No results found for this basename: PTH,  in the last 72 hours Iron Studies: No results found for this basename: IRON, TIBC, TRANSFERRIN, FERRITIN,  in the last 72 hours  Studies/Results: No results found.  I have reviewed the patient's current medications.  Assessment/Plan: Problem #1 end-stage renal disease s/p  Dialysis yesterday . His BUN is 22 creatinine 4.7 and potassium is 3.6. 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 S/P revision of his wound, Problem #4 anemia: His hemoglobin and hematocrit is low and patient is on  Epogen. Problem #5 carotid disease: Status post CABG is a symptomatic.  Problem #6 diabetes Problem #7 metabolic bone disease : His calcium is with in acceptable range. Plan: We'll dialyze patient in am We will check BMP/ Phosphorus in am  LOS: 2 days   Joory Gough S 09/19/2013,12:34 PM

## 2013-09-19 NOTE — Progress Notes (Signed)
Pt left the floor with Pam Shreve,RN to go to OR. VSS. No sign of distress. No c/o pain.

## 2013-09-20 DIAGNOSIS — D62 Acute posthemorrhagic anemia: Secondary | ICD-10-CM | POA: Diagnosis present

## 2013-09-20 LAB — PROTIME-INR
INR: 1.7 — ABNORMAL HIGH (ref 0.00–1.49)
PROTHROMBIN TIME: 19.5 s — AB (ref 11.6–15.2)

## 2013-09-20 LAB — BASIC METABOLIC PANEL
BUN: 40 mg/dL — ABNORMAL HIGH (ref 6–23)
CALCIUM: 9.7 mg/dL (ref 8.4–10.5)
CHLORIDE: 98 meq/L (ref 96–112)
CO2: 25 meq/L (ref 19–32)
CREATININE: 8.66 mg/dL — AB (ref 0.50–1.35)
GFR calc Af Amer: 7 mL/min — ABNORMAL LOW (ref >90)
GFR calc non Af Amer: 6 mL/min — ABNORMAL LOW (ref >90)
GLUCOSE: 127 mg/dL — AB (ref 70–99)
Potassium: 4.8 mEq/L (ref 3.7–5.3)
Sodium: 139 mEq/L (ref 137–147)

## 2013-09-20 LAB — GLUCOSE, CAPILLARY
GLUCOSE-CAPILLARY: 112 mg/dL — AB (ref 70–99)
GLUCOSE-CAPILLARY: 121 mg/dL — AB (ref 70–99)
Glucose-Capillary: 110 mg/dL — ABNORMAL HIGH (ref 70–99)

## 2013-09-20 LAB — CBC
HEMATOCRIT: 28.8 % — AB (ref 39.0–52.0)
Hemoglobin: 8.9 g/dL — ABNORMAL LOW (ref 13.0–17.0)
MCH: 29.3 pg (ref 26.0–34.0)
MCHC: 30.9 g/dL (ref 30.0–36.0)
MCV: 94.7 fL (ref 78.0–100.0)
Platelets: 174 10*3/uL (ref 150–400)
RBC: 3.04 MIL/uL — AB (ref 4.22–5.81)
RDW: 16.7 % — ABNORMAL HIGH (ref 11.5–15.5)
WBC: 10.1 10*3/uL (ref 4.0–10.5)

## 2013-09-20 LAB — WOUND CULTURE

## 2013-09-20 LAB — HEPARIN LEVEL (UNFRACTIONATED): HEPARIN UNFRACTIONATED: 0.39 [IU]/mL (ref 0.30–0.70)

## 2013-09-20 MED ORDER — VANCOMYCIN HCL IN DEXTROSE 1-5 GM/200ML-% IV SOLN
1000.0000 mg | Freq: Once | INTRAVENOUS | Status: AC
  Administered 2013-09-20: 1000 mg via INTRAVENOUS
  Filled 2013-09-20: qty 200

## 2013-09-20 MED ORDER — CHLORHEXIDINE GLUCONATE CLOTH 2 % EX PADS
6.0000 | MEDICATED_PAD | Freq: Every day | CUTANEOUS | Status: DC
  Administered 2013-09-21 – 2013-09-23 (×3): 6 via TOPICAL

## 2013-09-20 MED ORDER — HEPARIN BOLUS VIA INFUSION
2000.0000 [IU] | Freq: Once | INTRAVENOUS | Status: AC
  Administered 2013-09-20: 2000 [IU] via INTRAVENOUS
  Filled 2013-09-20: qty 2000

## 2013-09-20 MED ORDER — MUPIROCIN 2 % EX OINT
1.0000 "application " | TOPICAL_OINTMENT | Freq: Two times a day (BID) | CUTANEOUS | Status: DC
  Administered 2013-09-21 – 2013-09-24 (×7): 1 via NASAL
  Filled 2013-09-20 (×4): qty 22

## 2013-09-20 NOTE — Progress Notes (Signed)
POD #1  Filed Vitals:   09/20/13 1245  BP: 106/90  Pulse: 108  Temp:   Resp: 17  temp 98.0  Wound clean and dry. 75 cc bloody fluid since surgery.  Pt currently being dialyzed and on heparin drip.  Will do first dressing change in AM.  Have discussed H/H and diabetic status with med service.  Will not transfuse at this point.  Clinically pt doing very well post op with minimal discomfort.

## 2013-09-20 NOTE — Op Note (Signed)
NAMEMAYFIELD, SCHOENE NO.:  [id number]  MEDICAL RECORD NO.:  [id number]  LOCATION:  IC09                          FACILITY:  [facility]  PHYSICIAN:  Felicie Morn, M.D. DATE OF BIRTH:  [birth year]  DATE OF PROCEDURE:  09/19/2013 DATE OF DISCHARGE:                              OPERATIVE REPORT   PROCEDURE:  Right stump revision.  WOUND CLASSIFICATION:  Clean, contaminated.  SPECIMEN:  Right stump material including bone, tendon muscle, skin, and subcutaneous tissue.  This was not sent to Pathology.  NOTE:  This is a 63 year old African American who had a right below-the- knee amputation in December, who was doing well until he fell, opening his entire wound.  He came to the emergency room.  He was admitted to the Medical Service.  He has multiple medical problems including coronary artery disease, cardiac valvular disease, atrial fib, diabetes type 2, and end-stage renal failure.  After his coagulations, blood sugars, and his dialysis status was taken care of, he was taken to surgery.  Prior to that time, his wound was irrigated, it was quite clean, and I thought we would be able to do a stump revision as there was not an issue with infection.  TECHNIQUE:  The patient was placed in supine position.  We used local anesthesia as well as IV sedation.  His nerve conduction distally allowed [place] to use actually a minimal of anesthesia.  We shorten the bone and debrided muscle tendon, subcutaneous tissue, and skin in order to allow a closure of the wound that did not have too much tension.  The wound was copiously irrigated with an irrigating device and I placed use bone wax for the tibia and use Surgicel as well for the oozing muscle areas.  There was no vascular bleeding other than oozing muscle bed.  I elected to leave a Jackson-Pratt drain from a separate stab wound incision, which was left in the stump.  The fascia was closed with 0 Polysorb suture and the skin was  approximated with 3-0 and 4-0 nylon and vertical mattress sutures.  We covered the wound with Xeroform, fluff, and put the patient in an extension splint and elevated his wound. Prior to closure, all sponge, needle, and instrument counts were found to be correct.  Estimated blood loss was approximately 200 mL.  He received 250 mL of crystalloids intraoperatively.  There were no Complications.  I will inform Dr. Arnoldo Morale of his progress on his return.     Felicie Morn, M.D.     WB/MEDQ  D:  09/19/2013  T:  09/20/2013  Job:  [id number]

## 2013-09-20 NOTE — Progress Notes (Signed)
[name] [name] for heparin Indication: atrial fibrillation  Allergies  Allergen Reactions  . Bacitracin Other (See Comments)    unknown  . Norvasc [Amlodipine Besylate] Other (See Comments)    unknown   Patient Measurements: Height: 5\' 11"  (180.3 cm) Weight: 221 lb 12.8 oz (100.608 kg) IBW/kg (Calculated) : 75.3 Heparin Dosing Weight: 80kg  Vital Signs: Temp: 99 F (37.2 C) (01/24 0800) Temp src: Oral (01/24 0800) BP: 131/76 mmHg (01/24 0800)  Labs:  Recent Labs  09/18/13 0700  09/18/13 1738 09/19/13 0446 09/19/13 1325 09/19/13 2246 09/20/13 0522 09/20/13 0923 09/20/13 0924  HGB 9.9*  --   --   --  9.8*  --  8.9*  --   --   HCT 31.1*  --   --   --  31.3*  --  28.8*  --   --   PLT 175  --   --   --   --   --  174  --   --   LABPROT 21.9*  --   --  18.8*  --   --   --  19.5*  --   INR 1.98*  --   --  1.62*  --   --   --  1.70*  --   HEPARINUNFRC  --   < > 0.27*  --   --  <0.10*  --   --  0.39  CREATININE 4.77*  --   --   --   --   --  8.66*  --   --   < > = values in this interval not displayed. Estimated Creatinine Clearance: 10.4 ml/min (by C-G formula based on Cr of 8.66).  Medical History: Past Medical History  Diagnosis Date  . UGI bleed 02/12/11    on anticoagulation; EGD w/snare polypectomy-multiple polypoid lesions antrum(benign), Chronic active gastritis (NEGATIVE H pylori)  . Anemia, chronic disease   . Sepsis due to enterococcus 02/11/11  . Atrial fibrillation     coumadin  . CAD (coronary artery disease)     s/p CABGx4  . Cardiomyopathy     40-45% EF 6/12  . Diabetes mellitus   . Hypertension   . Hyperlipemia   . Gout   . S/P patent foramen ovale closure   . History of nuclear stress test 11/2011    negative myoview  . End stage kidney disease     T/TH/SAT dialyis, Dr Hinda Lenis  . Gout   . History of nuclear stress test 07/04/2010    dipyridamole; mod fixed inferolateral defect; non-diagnostic for ischemia;  low risk scan   . Hyperbilirubinemia 08/10/2013   Medications:  Scheduled:  . allopurinol  100 mg Oral Daily  . amiodarone  100 mg Oral Daily  . atorvastatin  20 mg Oral q1800  . diltiazem  180 mg Oral Daily  . epoetin alfa  4,000 Units Intravenous 3 times weekly  . insulin aspart  0-9 Units Subcutaneous TID WC  . metoprolol tartrate  25 mg Oral TID  . pantoprazole  40 mg Oral BID  . piperacillin-tazobactam (ZOSYN)  IV  2.25 g Intravenous Q8H  . sevelamer carbonate  800 mg Oral TID WC  . sodium chloride  3 mL Intravenous Q12H  . traZODone  50 mg Oral QHS   Assessment: 63 yo M on chronic warfarin for Afib.  He is admitted s/p fall with plans for OR 09/19/2013.  INR 1.93 on admission.   No active bleeding.  CBC  consistent with anemia of chronic disease.  Heparin bridge was started.  Heparin will be stopped 1/23 at 0700 in preparation for OR to close wound.   Heparin restarted after surgery Heparin level at goal   Goal of Therapy:  Heparin level 0.3-0.7 units/ml Monitor platelets by anticoagulation protocol: Yes   Plan:  Continue  heparin infusion at 1900 units/hr Unfractionated heparin level daily F/U anticoagulation plan    [name] [name], [name] [name] 09/20/2013,10:21 AM

## 2013-09-20 NOTE — Progress Notes (Signed)
[place] for heparin Indication: atrial fibrillation  Allergies  Allergen Reactions  . Bacitracin Other (See Comments)    unknown  . Norvasc [Amlodipine Besylate] Other (See Comments)    unknown   Patient Measurements: Height: 5\' 11"  (180.3 cm) Weight: 221 lb 12.8 oz (100.608 kg) IBW/kg (Calculated) : 75.3 Heparin Dosing Weight: 80kg  Vital Signs: Temp: 99.7 F (37.6 C) (01/24 0400) Temp src: Oral (01/24 0400) BP: 102/70 mmHg (01/24 0600)  Labs:  Recent Labs  09/17/13 0812  09/18/13 0700 09/18/13 0701 09/18/13 1738 09/19/13 0446 09/19/13 1325 09/19/13 2246 09/20/13 0522  HGB 10.1*  --  9.9*  --   --   --  9.8*  --  8.9*  HCT 32.4*  --  31.1*  --   --   --  31.3*  --  28.8*  PLT 167  --  175  --   --   --   --   --  174  APTT 37  --   --   --   --   --   --   --   --   LABPROT 21.5*  --  21.9*  --   --  18.8*  --   --   --   INR 1.93*  --  1.98*  --   --  1.62*  --   --   --   HEPARINUNFRC  --   < >  --  0.15* 0.27*  --   --  <0.10*  --   CREATININE 7.11*  --  4.77*  --   --   --   --   --  8.66*  < > = values in this interval not displayed. Estimated Creatinine Clearance: 10.4 ml/min (by C-G formula based on Cr of 8.66).  Medical History: Past Medical History  Diagnosis Date  . UGI bleed 02/12/11    on anticoagulation; EGD w/snare polypectomy-multiple polypoid lesions antrum(benign), Chronic active gastritis (NEGATIVE H pylori)  . Anemia, chronic disease   . Sepsis due to enterococcus 02/11/11  . Atrial fibrillation     coumadin  . CAD (coronary artery disease)     s/p CABGx4  . Cardiomyopathy     40-45% EF 6/12  . Diabetes mellitus   . Hypertension   . Hyperlipemia   . Gout   . S/P patent foramen ovale closure   . History of nuclear stress test 11/2011    negative myoview  . End stage kidney disease     T/TH/SAT dialyis, Dr Hinda Lenis  . Gout   . History of nuclear stress test 07/04/2010    dipyridamole; mod  fixed inferolateral defect; non-diagnostic for ischemia; low risk scan   . Hyperbilirubinemia 08/10/2013   Medications:  Scheduled:  . allopurinol  100 mg Oral Daily  . amiodarone  100 mg Oral Daily  . atorvastatin  20 mg Oral q1800  . diltiazem  180 mg Oral Daily  . epoetin alfa  4,000 Units Intravenous 3 times weekly  . insulin aspart  0-9 Units Subcutaneous TID WC  . metoprolol tartrate  25 mg Oral TID  . pantoprazole  40 mg Oral BID  . piperacillin-tazobactam (ZOSYN)  IV  2.25 g Intravenous Q8H  . sevelamer carbonate  800 mg Oral TID WC  . sodium chloride  3 mL Intravenous Q12H  . traZODone  50 mg Oral QHS   Assessment: 63 yo M on chronic warfarin for Afib.  He is admitted  s/p fall with plans for OR 09/19/2013.  INR 1.93 on admission.   No active bleeding.  CBC consistent with anemia of chronic disease.  Heparin bridge was started.  Heparin will be stopped 1/23 at 0700 in preparation for OR to close wound.   Heparin restarted after surgery Heparin level below goal   Goal of Therapy:  Heparin level 0.3-0.7 units/ml Monitor platelets by anticoagulation protocol: Yes   Plan:  Heparin 2000 Units IV, then increase heparin infusion to 1900 units/hr Recheck heparin level at 9 AM and daily F/U anticoagulation plan    Abner Greenspan, Bee Marchiano Bennett 09/20/2013,7:33 AM

## 2013-09-20 NOTE — Progress Notes (Signed)
Surgery  Filed Vitals:   09/20/13 1600  BP: 114/74  Pulse:   Temp:   Resp: 13  pulse 128, temp.97.8  Pt doing well no obviously bleeding from stump and only a few cc's of bloody fluid in JP since this AM.   Toleratated dialysis well.

## 2013-09-20 NOTE — Progress Notes (Signed)
Subjective: Interval History: Patient does not have any complaint. He denies any difficulty in breathing.  Objective: Vital signs in last 24 hours: Temp:  [97.9 F (36.6 C)-99.8 F (37.7 C)] 99.7 F (37.6 C) (01/24 0400) Pulse Rate:  [63-108] 63 (01/23 1900) Resp:  [13-28] 24 (01/24 0600) BP: (100-143)/(60-106) 102/70 mmHg (01/24 0600) SpO2:  [91 %-100 %] 92 % (01/23 1900) Weight:  [100.608 kg (221 lb 12.8 oz)] 100.608 kg (221 lb 12.8 oz) (01/24 0600) Weight change: 2.208 kg (4 lb 13.9 oz)  Intake/Output from previous day: 01/23 0701 - 01/24 0700 In: 1265.2 [P.O.:240; I.V.:375.2; IV Piggyback:650] Out: 390 [Urine:100; Drains:90; Blood:200] Intake/Output this shift:    General appearance: alert, cooperative and no distress Resp: clear to auscultation bilaterally Cardio: regular rate and rhythm, S1, S2 normal, no murmur, click, rub or gallop GI: soft, non-tender; bowel sounds normal; no masses,  no organomegaly Extremities: extremities normal, atraumatic, no cyanosis or edema  Lab Results:  Recent Labs  09/18/13 0700 09/19/13 1325 09/20/13 0522  WBC 7.9  --  10.1  HGB 9.9* 9.8* 8.9*  HCT 31.1* 31.3* 28.8*  PLT 175  --  174   BMET:   Recent Labs  09/18/13 0700 09/20/13 0522  NA 142 139  K 3.6* 4.8  CL 100 98  CO2 27 25  GLUCOSE 91 127*  BUN 22 40*  CREATININE 4.77* 8.66*  CALCIUM 9.6 9.7   No results found for this basename: PTH,  in the last 72 hours Iron Studies: No results found for this basename: IRON, TIBC, TRANSFERRIN, FERRITIN,  in the last 72 hours  Studies/Results: No results found.  I have reviewed the patient's current medications.  Assessment/Plan: Problem #1 end-stage renal disease s/p  Dialysis  The day before yesterday . His BUN is 40 and his  Creatinine is 8.66 and potassium is 4.8. 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 S/P revision of his wound, Problem  #4 anemia: His hemoglobin and hematocrit is low and patient is on Epogen. Still declining Problem #5 carotid disease: Status post CABG is a symptomatic.  Problem #6 diabetes Problem #7 metabolic bone disease : His calcium is with in acceptable range. Plan: We'll dialyze patient today We will check BMP/ Phosphorus in am  LOS: 3 days   Nichole Keltner S 09/20/2013,7:39 AM

## 2013-09-20 NOTE — Progress Notes (Signed)
[facility] PROGRESS NOTE  [name] [name] UUV:[id number] DOB: 1950-10-24 DOA: 09/17/2013 PCP: [name], MD  Summary: 63 year old man hospitalized 07/2013 for sepsis, osteomyelitis of the right foot, underwent BKA. Today he fell on the right BKA stump resulting in rupture of wound. Because of medical complexity, medical admission was requested. He underwent primary closure of the wound without apparent complication. Wound culture now growing staph aureus and gram-negative rods.  Assessment/Plan: 1. Traumatic dehiscence of right BKA wound status post mechanical fall. Status post primary closure 1/23. Wound culture obtained in the emergency department  with few staph aureus and a few gram-negative rods. Unable to reach Dr. Romona Curls yesterday to discuss. Will discuss with them this morning. 2. Acute blood loss anemia. Perioperative. Monitor clinically. 3. Atrial fibrillation. Stable. Continue amiodarone, metoprolol, diltiazem.  4. End-stage renal disease, continue hemodialysis. 5. Anemia of chronic disease, stable. 6. Diabetes mellitus type 2, stable. Continue sliding scale. 7. History coronary artery disease, CABG 2008, status post bioprosthetic mitral valve replacement 2008. Negative nuclear stress test 11/2011. Stable. Asymptomatic. 8. History of systolic congestive heart failure, last EF reportedly 40-45% June 2004. Appears compensated.   Continue empiric antibiotics. Management of wound per surgery.  Continue heparin until cleared to resume Coumadin per surgery.  Hemodialysis per nephrology  Follow CBC, repeat in the morning  Resume Consalvo-acting insulin when diet improves and blood sugars increase  Pending studies:   Wound culture--few staph aureus and few gram-negative rods  Code Status: full code DVT prophylaxis: on heparin Family Communication: none present Disposition Plan: home when improved  [name], MD  [facility]  Pager [phone] If 7PM-7AM,  please contact night-coverage at [email or web address], password [facility] 09/20/2013, 8:25 AM  LOS: 3 days   Consultants:  General surgery  Nephrology  Procedures:  Routine hemodialysis  1/23 STUMP REVISION   Antibiotics:  Zosyn 1/24 >>   Vancomycin 1/24 >>   HPI/Subjective: He feels good. Pain is controlled. No complaints.  Objective: Filed Vitals:   09/20/13 0500 09/20/13 0600 09/20/13 0700 09/20/13 0800  BP: 116/91 102/70 110/83 131/76  Pulse:      Temp:    99 F (37.2 C)  TempSrc:    Oral  Resp: 21 24 14 23   Height:      Weight:  100.608 kg (221 lb 12.8 oz)    SpO2:        Intake/Output Summary (Last 24 hours) at 09/20/13 0825 Last data filed at 09/20/13 0800  Gross per 24 hour  Intake 1303.23 ml  Output    465 ml  Net 838.23 ml     Filed Weights   09/17/13 0957 09/19/13 0620 09/20/13 0600  Weight: 96.616 kg (213 lb) 98.4 kg (216 lb 14.9 oz) 100.608 kg (221 lb 12.8 oz)    Exam:   Afebrile, vital signs stable. Minimal tachycardia.  General: Appears calm and comfortable. Stump is elevated.  Cardiovascular irregular, heart rate low 100s. No murmur, rub or gallop.  Respiratory clear to auscultation bilaterally. No wheezes, rales or rhonchi. Normal respiratory effort  Psychiatric grossly normal mood and affect. Speech fluent and appropriate.  Data Reviewed:  Capillary blood sugars stable, no hypoglycemia  Hemoglobin slightly lower, 8.9.  Scheduled Meds: . allopurinol  100 mg Oral Daily  . amiodarone  100 mg Oral Daily  . atorvastatin  20 mg Oral q1800  . diltiazem  180 mg Oral Daily  . epoetin alfa  4,000 Units Intravenous 3 times weekly  . insulin aspart  0-9 Units Subcutaneous  TID WC  . metoprolol tartrate  25 mg Oral TID  . pantoprazole  40 mg Oral BID  . piperacillin-tazobactam (ZOSYN)  IV  2.25 g Intravenous Q8H  . sevelamer carbonate  800 mg Oral TID WC  . sodium chloride  3 mL Intravenous Q12H  . traZODone  50 mg Oral QHS   Continuous  Infusions: . heparin 1,900 Units/hr (09/20/13 0600)    Principal Problem:   Traumatic wound dehiscence Active Problems:   Atrial fibrillation   Nicolson term (current) use of anticoagulants   CAD (coronary artery disease)   S/P CABG (coronary artery bypass graft)   H/O mitral valve replacement   ESRD (end stage renal disease) on dialysis   DM type 2 causing ESRD   Anemia of chronic disease   Fall   Time spent 25 minutes

## 2013-09-20 NOTE — Procedures (Signed)
   HEMODIALYSIS TREATMENT NOTE:  4 hour dialysis completed via right forearm AVF (15g/antegrade).  Goal met:  Tolerated removal of 3.1 liters with no interruption in ultrafiltration.  All blood was reinfused.  Hemostasis achieved within 15 minutes.  Report given to Sharene Skeans, RN.  Osaze Hubbert L. Jaquon Gingerich, RN, CDN

## 2013-09-20 NOTE — Anesthesia Postprocedure Evaluation (Signed)
Anesthesia Post Note  Patient: [name] [name]  Procedure(s) Performed: Procedure(s) (LRB): STUMP REVISION (Right)  Anesthesia type: General  Patient location: Nursing unit  Post pain: Pain level controlled  Post assessment: Post-op Vital signs reviewed, Patient's Cardiovascular Status Stable, Respiratory Function Stable, Patent Airway, No signs of Nausea or vomiting and Pain level controlled  Last Vitals:  Filed Vitals:   09/20/13 1315  BP: 96/79  Pulse: 112  Temp:   Resp: 20    Post vital signs: Reviewed and stable  Level of consciousness: awake and alert   Complications: No apparent anesthesia complications

## 2013-09-20 NOTE — Progress Notes (Signed)
[place] for Vancomycin & Zosyn Indication: Wound infection  Allergies  Allergen Reactions  . Bacitracin Other (See Comments)    unknown  . Norvasc [Amlodipine Besylate] Other (See Comments)    unknown    Patient Measurements: Height: 5\' 11"  (180.3 cm) Weight: 221 lb 12.8 oz (100.608 kg) IBW/kg (Calculated) : 75.3 Adjusted Body Weight:   Vital Signs: Temp: 99 F (37.2 C) (01/24 0800) Temp src: Oral (01/24 0800) BP: 118/72 mmHg (01/24 1000) Intake/Output from previous day: 01/23 0701 - 01/24 0700 In: 1284.2 [P.O.:240; I.V.:394.2; IV Piggyback:650] Out: 390 [Urine:100; Drains:90; Blood:200] Intake/Output from this shift: Total I/O In: 57 [I.V.:57] Out: 257.5 [Urine:250; Drains:7.5]  Labs:  Recent Labs  09/18/13 0700 09/19/13 1325 09/20/13 0522  WBC 7.9  --  10.1  HGB 9.9* 9.8* 8.9*  PLT 175  --  174  CREATININE 4.77*  --  8.66*   Estimated Creatinine Clearance: 10.4 ml/min (by C-G formula based on Cr of 8.66). No results found for this basename: VANCOTROUGH, Corlis Leak, VANCORANDOM, GENTTROUGH, GENTPEAK, GENTRANDOM, TOBRATROUGH, TOBRAPEAK, TOBRARND, AMIKACINPEAK, AMIKACINTROU, AMIKACIN,  in the last 72 hours   Microbiology: Recent Results (from the past 720 hour(s))  WOUND CULTURE     Status: None   Collection Time    09/17/13  9:00 AM      Result Value Range Status   Specimen Description WOUND LEG   Final   Special Requests NONE   Final   Gram Stain     Final   Value: RARE WBC PRESENT, PREDOMINANTLY PMN     NO SQUAMOUS EPITHELIAL CELLS SEEN     NO ORGANISMS SEEN     Performed at [facility]   Culture     Final   Value: FEW METHICILLIN RESISTANT STAPHYLOCOCCUS AUREUS     Note: RIFAMPIN AND GENTAMICIN SHOULD NOT BE USED AS SINGLE DRUGS FOR TREATMENT OF STAPH INFECTIONS. This organism is presumed to be Clindamycin resistant based on detection of inducible Clindamycin resistance. CRITICAL RESULT CALLED TO, READ BACK  BY AND      VERIFIED WITH: N. ROWE 1/24 @1015  BY REAMM     FEW PROTEUS MIRABILIS     Performed at [facility]   Report Status 09/20/2013 FINAL   Final   Organism ID, Bacteria METHICILLIN RESISTANT STAPHYLOCOCCUS AUREUS   Final   Organism ID, Bacteria PROTEUS MIRABILIS   Final  MRSA PCR SCREENING     Status: Abnormal   Collection Time    09/18/13 12:10 AM      Result Value Range Status   MRSA by PCR POSITIVE (*) NEGATIVE Final   Comment:            The GeneXpert MRSA Assay (FDA     approved for NASAL specimens     only), is one component of a     comprehensive MRSA colonization     surveillance program. It is not     intended to diagnose MRSA     infection nor to guide or     monitor treatment for     MRSA infections.     RESULT CALLED TO, READ BACK BY AND VERIFIED WITH:     Daun Peacock AT 0203 ON [id number] BY FORSYTH K    Medical History: Past Medical History  Diagnosis Date  . UGI bleed 02/12/11    on anticoagulation; EGD w/snare polypectomy-multiple polypoid lesions antrum(benign), Chronic active gastritis (NEGATIVE H pylori)  . Anemia, chronic disease   .  Sepsis due to enterococcus 02/11/11  . Atrial fibrillation     coumadin  . CAD (coronary artery disease)     s/p CABGx4  . Cardiomyopathy     40-45% EF 6/12  . Diabetes mellitus   . Hypertension   . Hyperlipemia   . Gout   . S/P patent foramen ovale closure   . History of nuclear stress test 11/2011    negative myoview  . End stage kidney disease     T/TH/SAT dialyis, Dr Hinda Lenis  . Gout   . History of nuclear stress test 07/04/2010    dipyridamole; mod fixed inferolateral defect; non-diagnostic for ischemia; low risk scan   . Hyperbilirubinemia 08/10/2013    Medications:  Scheduled:  . allopurinol  100 mg Oral Daily  . amiodarone  100 mg Oral Daily  . atorvastatin  20 mg Oral q1800  . diltiazem  180 mg Oral Daily  . epoetin alfa  4,000 Units Intravenous 3 times weekly  . insulin aspart  0-9 Units  Subcutaneous TID WC  . metoprolol tartrate  25 mg Oral TID  . pantoprazole  40 mg Oral BID  . piperacillin-tazobactam (ZOSYN)  IV  2.25 g Intravenous Q8H  . sevelamer carbonate  800 mg Oral TID WC  . sodium chloride  3 mL Intravenous Q12H  . traZODone  50 mg Oral QHS  . vancomycin  1,000 mg Intravenous Once   Assessment: Traumatic dehiscence of right BKA wound post mechanical fall. Wound culture with staph aureus and gram negative rods Cultures pending Dialysis scheduled for today 09/20/2013  Goal of Therapy:  Vancomycin trough level 15-20 mcg/ml  Plan:  Vancomycin 1 GM IV x 1 dose after dialysis today F/U dialysis days for further Vancomycin dosing Continue Zosyn 2.5 GM IV every 8 hours Labs per protocol   [name] [name], [name] [name] 09/20/2013,11:03 AM

## 2013-09-20 NOTE — Progress Notes (Signed)
CRITICAL VALUE ALERT  Critical value received: MRSA + wound cx from 21st  Date of notification:  09/20/13  Time of notification:  6553  Critical value read back:yes  Nurse who received alert:  N.Vincie Linn,RN  MD notified (1st page):  Sarajane Jews  Time of first page:  1029  MD notified (2nd page):  Time of second page:  Responding MD:  Sarajane Jews  Time MD responded:  [phone]

## 2013-09-21 DIAGNOSIS — D638 Anemia in other chronic diseases classified elsewhere: Secondary | ICD-10-CM

## 2013-09-21 LAB — CBC
HCT: 28.1 % — ABNORMAL LOW (ref 39.0–52.0)
Hemoglobin: 8.5 g/dL — ABNORMAL LOW (ref 13.0–17.0)
MCH: 29 pg (ref 26.0–34.0)
MCHC: 30.2 g/dL (ref 30.0–36.0)
MCV: 95.9 fL (ref 78.0–100.0)
Platelets: 181 10*3/uL (ref 150–400)
RBC: 2.93 MIL/uL — ABNORMAL LOW (ref 4.22–5.81)
RDW: 16.1 % — AB (ref 11.5–15.5)
WBC: 9.7 10*3/uL (ref 4.0–10.5)

## 2013-09-21 LAB — PROTIME-INR
INR: 1.75 — ABNORMAL HIGH (ref 0.00–1.49)
Prothrombin Time: 19.9 seconds — ABNORMAL HIGH (ref 11.6–15.2)

## 2013-09-21 LAB — BASIC METABOLIC PANEL
BUN: 23 mg/dL (ref 6–23)
CALCIUM: 9.5 mg/dL (ref 8.4–10.5)
CO2: 29 mEq/L (ref 19–32)
Chloride: 95 mEq/L — ABNORMAL LOW (ref 96–112)
Creatinine, Ser: 6.01 mg/dL — ABNORMAL HIGH (ref 0.50–1.35)
GFR, EST AFRICAN AMERICAN: 10 mL/min — AB (ref >90)
GFR, EST NON AFRICAN AMERICAN: 9 mL/min — AB (ref >90)
GLUCOSE: 117 mg/dL — AB (ref 70–99)
POTASSIUM: 4.6 meq/L (ref 3.7–5.3)
SODIUM: 139 meq/L (ref 137–147)

## 2013-09-21 LAB — GLUCOSE, CAPILLARY
GLUCOSE-CAPILLARY: 84 mg/dL (ref 70–99)
GLUCOSE-CAPILLARY: 140 mg/dL — AB (ref 70–99)
GLUCOSE-CAPILLARY: 117 mg/dL — AB (ref 70–99)
Glucose-Capillary: 115 mg/dL — ABNORMAL HIGH (ref 70–99)
Glucose-Capillary: 128 mg/dL — ABNORMAL HIGH (ref 70–99)

## 2013-09-21 LAB — HEPARIN LEVEL (UNFRACTIONATED): HEPARIN UNFRACTIONATED: 0.37 [IU]/mL (ref 0.30–0.70)

## 2013-09-21 LAB — PHOSPHORUS: Phosphorus: 3.8 mg/dL (ref 2.3–4.6)

## 2013-09-21 MED ORDER — DILTIAZEM HCL ER COATED BEADS 240 MG PO CP24
240.0000 mg | ORAL_CAPSULE | Freq: Every day | ORAL | Status: DC
  Administered 2013-09-21: 240 mg via ORAL
  Filled 2013-09-21: qty 1

## 2013-09-21 NOTE — Progress Notes (Signed)
Subjective: Interval History: Patient denies any nausea or vomiting but his appetite is poor. Does not want Nepro because of diarrhea when ever he drinks it. He denies any difficulty in breathing.  Objective: Vital signs in last 24 hours: Temp:  [97.8 F (36.6 C)-99 F (37.2 C)] 98.8 F (37.1 C) (01/25 0400) Pulse Rate:  [100-135] 109 (01/25 0700) Resp:  [13-28] 15 (01/25 0700) BP: (82-131)/(53-91) 104/69 mmHg (01/25 0700) SpO2:  [91 %-100 %] 96 % (01/25 0700) Weight:  [94.2 kg (207 lb 10.8 oz)] 94.2 kg (207 lb 10.8 oz) (01/25 0500) Weight change: -6.408 kg (-14 lb 2 oz)  Intake/Output from previous day: 01/24 0701 - 01/25 0700 In: 1416 [P.O.:480; I.V.:586; IV Piggyback:350] Out: 3367.5 [Urine:250; Drains:17.5] Intake/Output this shift:    General appearance: alert, cooperative and no distress Resp: clear to auscultation bilaterally Cardio: regular rate and rhythm, S1, S2 normal, no murmur, click, rub or gallop GI: soft, non-tender; bowel sounds normal; no masses,  no organomegaly Extremities: extremities normal, atraumatic, no cyanosis or edema  Lab Results:  Recent Labs  09/20/13 0522 09/21/13 0500  WBC 10.1 9.7  HGB 8.9* 8.5*  HCT 28.8* 28.1*  PLT 174 181   BMET:   Recent Labs  09/20/13 0522 09/21/13 0500  NA 139 139  K 4.8 4.6  CL 98 95*  CO2 25 29  GLUCOSE 127* 117*  BUN 40* 23  CREATININE 8.66* 6.01*  CALCIUM 9.7 9.5   No results found for this basename: PTH,  in the last 72 hours Iron Studies: No results found for this basename: IRON, TIBC, TRANSFERRIN, FERRITIN,  in the last 72 hours  Studies/Results: No results found.  I have reviewed the patient's current medications.  Assessment/Plan: Problem #1 end-stage renal disease s/p  Dialysis  yesterday . His BUN is 23 and his  Creatinine is 6.01  and potassium is 4.6. Problem #2 after fibrillation: his heart rate is controlled. 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  S/P revision of his wound, Problem #4 anemia: His hemoglobin and hematocrit is low and patient is on Epogen. stable Problem #5 carotid disease: Status post CABG is a symptomatic.  Problem #6 diabetes Problem #7 metabolic bone disease : His calcium and phosphorus is in range Plan: We'll continue with present treatment We will check Basic metabolic panel  in am  LOS: 4 days   [name] [name] S 09/21/2013,7:24 AM

## 2013-09-21 NOTE — Progress Notes (Signed)
[facility] PROGRESS NOTE  [name] [name] QMV:[id number] DOB: 03-25-1951 DOA: 09/17/2013 PCP: [name], MD  Summary: 63 year old man hospitalized 07/2013 for sepsis, osteomyelitis of the right foot, underwent BKA. Today he fell on the right BKA stump resulting in rupture of wound. Because of medical complexity, medical admission was requested. He underwent primary closure of the wound without apparent complication. Wound culture now growing staph aureus and gram-negative rods.  Assessment/Plan: 1. Traumatic dehiscence of right BKA wound status post mechanical fall. Status post primary closure 1/23. Wound culture obtained in the emergency department  with MRSA and Proteus. See discussion below. 2. Acute blood loss anemia superimposed on anemia of chronic disease. Perioperative. Monitor clinically. 3. Atrial fibrillation. Mild tachycardia. Continue amiodarone, metoprolol, increase diltiazem.  4. End-stage renal disease, continue hemodialysis. 5. Diabetes mellitus type 2, stable. Continue sliding scale. 6. History coronary artery disease, CABG 2008, status post bioprosthetic mitral valve replacement 2008. Negative nuclear stress test 11/2011. Stable. Asymptomatic. 7. History of systolic congestive heart failure, last EF reportedly 40-45% June 2004. Appears well compensated.   Continue Zosyn and vancomycin. Will discuss with infectious disease for definitive recommendations, I suspect these organisms represent colonization/contamination rather than pathogens causing acute infection. I discussed positive culture results with Dr. Romona Curls 1/24. Wound appeared clean throughout hospitalization and during surgery. Management of wound per surgery.  Continue heparin until cleared to resume Coumadin per surgery. Likely start Coumadin 1/26.  Adjust rate control agents.  Hemodialysis per nephrology  Follow CBC, repeat in the morning  Resume Durell-acting insulin when diet improves and blood  sugars increase  Likely transfer to floor 1/26  Microbiology:   Wound culture--Proteus mirabilis sensitive to cefazolin, cephalosporins, imipenem, Zosyn. MRSA sensitive to Bactrim, tetracycline, vancomycin.  Code Status: full code DVT prophylaxis: on heparin Family Communication: none present Disposition Plan: home when improved  [name], MD  [facility]  Pager [phone] If 7PM-7AM, please contact night-coverage at [email or web address], password [facility] 09/21/2013, 10:24 AM  LOS: 4 days   Consultants:  General surgery  Nephrology  Procedures:  Routine hemodialysis  1/23 STUMP REVISION   Antibiotics:  Zosyn 1/24 >>   Vancomycin 1/24 >>   HPI/Subjective: Tolerating dialysis well. Some pain in stump but otherwise doing well. No chest pain or shortness of breath.  Objective: Filed Vitals:   09/21/13 0800 09/21/13 0900 09/21/13 1012 09/21/13 1015  BP: 108/75 108/68 104/71 104/71  Pulse:    121  Temp: 98.4 F (36.9 C)     TempSrc: Oral     Resp: 19 15    Height:      Weight:      SpO2:        Intake/Output Summary (Last 24 hours) at 09/21/13 1024 Last data filed at 09/21/13 0700  Gross per 24 hour  Intake   1359 ml  Output   3110 ml  Net  -1751 ml     Filed Weights   09/19/13 0620 09/20/13 0600 09/21/13 0500  Weight: 98.4 kg (216 lb 14.9 oz) 100.608 kg (221 lb 12.8 oz) 94.2 kg (207 lb 10.8 oz)    Exam:   Afebrile, vital signs stable. Heart rate 110-120s.  General: Appears comfortable, calm. Speech fluent and clear.  Cardiovascular: Tachycardic, regular rhythm. No murmur, rub or gallop. No left lower extremity edema.  Respiratory: Clear to auscultation bilaterally. No wheezes, rales or rhonchi. Normal respiratory effort.  Psychiatric: Grossly normal mood and affect. Speech fluent and appropriate.  Data Reviewed:  Capillary blood sugars stable, no  hypoglycemia  Hemoglobin slightly lower, 8.5.  INR 9.98  Basic metabolic panel  consistent with end-stage renal disease  Scheduled Meds: . allopurinol  100 mg Oral Daily  . amiodarone  100 mg Oral Daily  . atorvastatin  20 mg Oral q1800  . Chlorhexidine Gluconate Cloth  6 each Topical Q0600  . diltiazem  240 mg Oral Daily  . epoetin alfa  4,000 Units Intravenous 3 times weekly  . insulin aspart  0-9 Units Subcutaneous TID WC  . metoprolol tartrate  25 mg Oral TID  . mupirocin ointment  1 application Nasal BID  . pantoprazole  40 mg Oral BID  . piperacillin-tazobactam (ZOSYN)  IV  2.25 g Intravenous Q8H  . sevelamer carbonate  800 mg Oral TID WC  . sodium chloride  3 mL Intravenous Q12H  . traZODone  50 mg Oral QHS   Continuous Infusions: . heparin 1,900 Units/hr (09/20/13 1739)    Principal Problem:   Traumatic wound dehiscence Active Problems:   Atrial fibrillation   [place] term (current) use of anticoagulants   CAD (coronary artery disease)   S/P CABG (coronary artery bypass graft)   H/O mitral valve replacement   ESRD (end stage renal disease) on dialysis   DM type 2 causing ESRD   Anemia of chronic disease   Fall   Acute blood loss anemia   Time spent 25 minutes

## 2013-09-21 NOTE — Progress Notes (Addendum)
Surgery  POD # 2  Filed Vitals:   09/21/13 1015  BP: 104/71  Pulse: 121  Temp:   Resp:   temp 98.2  Dressings changed.  Pt has blood accumulation from heparinization. This is non clotting and JP is helpful.  I am concerned that wound, despite the lack of tension, will be non healing. Uremia and anemia and the tendency toward wound hematoma due to necessary anticoagulation, do not bode well.  There is no infection. Agree with antibiotics.

## 2013-09-21 NOTE — Progress Notes (Signed)
[name] [name] for heparin Indication: atrial fibrillation  Allergies  Allergen Reactions  . Bacitracin Other (See Comments)    unknown  . Norvasc [Amlodipine Besylate] Other (See Comments)    unknown   Patient Measurements: Height: 5\' 11"  (180.3 cm) Weight: 207 lb 10.8 oz (94.2 kg) IBW/kg (Calculated) : 75.3 Heparin Dosing Weight: 80kg  Vital Signs: Temp: 98.8 F (37.1 C) (01/25 0400) Temp src: Oral (01/25 0400) BP: 104/69 mmHg (01/25 0700) Pulse Rate: 109 (01/25 0700)  Labs:  Recent Labs  09/19/13 0446  09/19/13 1325 09/19/13 2246 09/20/13 0522 09/20/13 0923 09/20/13 0924 09/21/13 0500  HGB  --   < > 9.8*  --  8.9*  --   --  8.5*  HCT  --   --  31.3*  --  28.8*  --   --  28.1*  PLT  --   --   --   --  174  --   --  181  LABPROT 18.8*  --   --   --   --  19.5*  --  19.9*  INR 1.62*  --   --   --   --  1.70*  --  1.75*  HEPARINUNFRC  --   --   --  <0.10*  --   --  0.39 0.37  CREATININE  --   --   --   --  8.66*  --   --  6.01*  < > = values in this interval not displayed. Estimated Creatinine Clearance: 14.6 ml/min (by C-G formula based on Cr of 6.01).  Medical History: Past Medical History  Diagnosis Date  . UGI bleed 02/12/11    on anticoagulation; EGD w/snare polypectomy-multiple polypoid lesions antrum(benign), Chronic active gastritis (NEGATIVE H pylori)  . Anemia, chronic disease   . Sepsis due to enterococcus 02/11/11  . Atrial fibrillation     coumadin  . CAD (coronary artery disease)     s/p CABGx4  . Cardiomyopathy     40-45% EF 6/12  . Diabetes mellitus   . Hypertension   . Hyperlipemia   . Gout   . S/P patent foramen ovale closure   . History of nuclear stress test 11/2011    negative myoview  . End stage kidney disease     T/TH/SAT dialyis, Dr Hinda Lenis  . Gout   . History of nuclear stress test 07/04/2010    dipyridamole; mod fixed inferolateral defect; non-diagnostic for ischemia; low risk scan   .  Hyperbilirubinemia 08/10/2013   Medications:  Scheduled:  . allopurinol  100 mg Oral Daily  . amiodarone  100 mg Oral Daily  . atorvastatin  20 mg Oral q1800  . Chlorhexidine Gluconate Cloth  6 each Topical Q0600  . diltiazem  180 mg Oral Daily  . epoetin alfa  4,000 Units Intravenous 3 times weekly  . insulin aspart  0-9 Units Subcutaneous TID WC  . metoprolol tartrate  25 mg Oral TID  . mupirocin ointment  1 application Nasal BID  . pantoprazole  40 mg Oral BID  . piperacillin-tazobactam (ZOSYN)  IV  2.25 g Intravenous Q8H  . sevelamer carbonate  800 mg Oral TID WC  . sodium chloride  3 mL Intravenous Q12H  . traZODone  50 mg Oral QHS   Assessment: 63 yo M on chronic warfarin for Afib.  He is admitted s/p fall with plans for OR 09/19/2013.  INR 1.93 on admission.   No active bleeding.  CBC consistent with anemia of chronic disease.  Heparin bridge was started.  Heparin will be stopped 1/23 at 0700 in preparation for OR to close wound.   Heparin restarted after surgery Heparin level at goal   Goal of Therapy:  Heparin level 0.3-0.7 units/ml Monitor platelets by anticoagulation protocol: Yes   Plan:  Continue  heparin infusion at 1900 units/hr Unfractionated heparin level daily F/U anticoagulation plan    Abner Greenspan, [facility] 09/21/2013,7:48 AM

## 2013-09-22 ENCOUNTER — Encounter (HOSPITAL_COMMUNITY): Payer: Self-pay | Admitting: General Surgery

## 2013-09-22 LAB — CBC
HCT: 26.3 % — ABNORMAL LOW (ref 39.0–52.0)
HEMOGLOBIN: 8.6 g/dL — AB (ref 13.0–17.0)
MCH: 30.4 pg (ref 26.0–34.0)
MCHC: 32.7 g/dL (ref 30.0–36.0)
MCV: 92.9 fL (ref 78.0–100.0)
Platelets: 185 10*3/uL (ref 150–400)
RBC: 2.83 MIL/uL — ABNORMAL LOW (ref 4.22–5.81)
RDW: 16.2 % — AB (ref 11.5–15.5)
WBC: 9.7 10*3/uL (ref 4.0–10.5)

## 2013-09-22 LAB — GLUCOSE, CAPILLARY
Glucose-Capillary: 106 mg/dL — ABNORMAL HIGH (ref 70–99)
Glucose-Capillary: 130 mg/dL — ABNORMAL HIGH (ref 70–99)
Glucose-Capillary: 124 mg/dL — ABNORMAL HIGH (ref 70–99)
Glucose-Capillary: 101 mg/dL — ABNORMAL HIGH (ref 70–99)

## 2013-09-22 LAB — PROTIME-INR
INR: 1.75 — ABNORMAL HIGH (ref 0.00–1.49)
Prothrombin Time: 19.9 seconds — ABNORMAL HIGH (ref 11.6–15.2)

## 2013-09-22 LAB — HEPARIN LEVEL (UNFRACTIONATED): Heparin Unfractionated: 0.55 IU/mL (ref 0.30–0.70)

## 2013-09-22 MED ORDER — DILTIAZEM HCL ER COATED BEADS 180 MG PO CP24
300.0000 mg | ORAL_CAPSULE | Freq: Every day | ORAL | Status: DC
  Administered 2013-09-22 – 2013-09-24 (×3): 300 mg via ORAL
  Filled 2013-09-22 (×6): qty 1

## 2013-09-22 MED ORDER — VANCOMYCIN HCL IN DEXTROSE 1-5 GM/200ML-% IV SOLN
1000.0000 mg | INTRAVENOUS | Status: DC
  Administered 2013-09-23: 1000 mg via INTRAVENOUS
  Filled 2013-09-22 (×2): qty 200

## 2013-09-22 MED ORDER — PIPERACILLIN-TAZOBACTAM IN DEX 2-0.25 GM/50ML IV SOLN
2.2500 g | Freq: Three times a day (TID) | INTRAVENOUS | Status: DC
  Administered 2013-09-22 – 2013-09-23 (×2): 2.25 g via INTRAVENOUS
  Filled 2013-09-22 (×2): qty 50

## 2013-09-22 MED ORDER — EPOETIN ALFA 20000 UNIT/ML IJ SOLN
14000.0000 [IU] | INTRAMUSCULAR | Status: DC
  Filled 2013-09-22: qty 1

## 2013-09-22 MED ORDER — EPOETIN ALFA 10000 UNIT/ML IJ SOLN
14000.0000 [IU] | INTRAMUSCULAR | Status: DC
  Filled 2013-09-22: qty 2

## 2013-09-22 NOTE — Progress Notes (Signed)
[place] for Vancomycin & Zosyn Indication: Wound infection  Allergies  Allergen Reactions  . Bacitracin Other (See Comments)    unknown  . Norvasc [Amlodipine Besylate] Other (See Comments)    unknown   Patient Measurements: Height: 5\' 11"  (180.3 cm) Weight: 209 lb 7 oz (95 kg) IBW/kg (Calculated) : 75.3  Vital Signs: Temp: 98.6 F (37 C) (01/26 0740) Temp src: Axillary (01/26 0740) BP: 112/63 mmHg (01/26 0800) Pulse Rate: 94 (01/26 0600) Intake/Output from previous day: 01/25 0701 - 01/26 0700 In: 1082 [P.O.:360; I.V.:572; IV Piggyback:150] Out: 10 [Drains:10] Intake/Output from this shift: Total I/O In: 169 [P.O.:120; I.V.:49] Out: -   Labs:  Recent Labs  09/20/13 0522 09/21/13 0500 09/22/13 0517  WBC 10.1 9.7 9.7  HGB 8.9* 8.5* 8.6*  PLT 174 181 185  CREATININE 8.66* 6.01*  --    Estimated Creatinine Clearance: 14.6 ml/min (by C-G formula based on Cr of 6.01). No results found for this basename: VANCOTROUGH, Corlis Leak, VANCORANDOM, GENTTROUGH, GENTPEAK, GENTRANDOM, TOBRATROUGH, TOBRAPEAK, TOBRARND, AMIKACINPEAK, AMIKACINTROU, AMIKACIN,  in the last 72 hours   Microbiology: Recent Results (from the past 720 hour(s))  WOUND CULTURE     Status: None   Collection Time    09/17/13  9:00 AM      Result Value Range Status   Specimen Description WOUND LEG   Final   Special Requests NONE   Final   Gram Stain     Final   Value: RARE WBC PRESENT, PREDOMINANTLY PMN     NO SQUAMOUS EPITHELIAL CELLS SEEN     NO ORGANISMS SEEN     Performed at [facility]   Culture     Final   Value: FEW METHICILLIN RESISTANT STAPHYLOCOCCUS AUREUS     Note: RIFAMPIN AND GENTAMICIN SHOULD NOT BE USED AS SINGLE DRUGS FOR TREATMENT OF STAPH INFECTIONS. This organism is presumed to be Clindamycin resistant based on detection of inducible Clindamycin resistance. CRITICAL RESULT CALLED TO, READ BACK BY AND      VERIFIED WITH: N. ROWE 1/24 @1015   BY REAMM     FEW PROTEUS MIRABILIS     Performed at [facility]   Report Status 09/20/2013 FINAL   Final   Organism ID, Bacteria METHICILLIN RESISTANT STAPHYLOCOCCUS AUREUS   Final   Organism ID, Bacteria PROTEUS MIRABILIS   Final  MRSA PCR SCREENING     Status: Abnormal   Collection Time    09/18/13 12:10 AM      Result Value Range Status   MRSA by PCR POSITIVE (*) NEGATIVE Final   Comment:            The GeneXpert MRSA Assay (FDA     approved for NASAL specimens     only), is one component of a     comprehensive MRSA colonization     surveillance program. It is not     intended to diagnose MRSA     infection nor to guide or     monitor treatment for     MRSA infections.     RESULT CALLED TO, READ BACK BY AND VERIFIED WITH:     Daun Peacock AT 0203 ON [id number] BY FORSYTH K    Medical History: Past Medical History  Diagnosis Date  . UGI bleed 02/12/11    on anticoagulation; EGD w/snare polypectomy-multiple polypoid lesions antrum(benign), Chronic active gastritis (NEGATIVE H pylori)  . Anemia, chronic disease   . Sepsis due to enterococcus  02/11/11  . Atrial fibrillation     coumadin  . CAD (coronary artery disease)     s/p CABGx4  . Cardiomyopathy     40-45% EF 6/12  . Diabetes mellitus   . Hypertension   . Hyperlipemia   . Gout   . S/P patent foramen ovale closure   . History of nuclear stress test 11/2011    negative myoview  . End stage kidney disease     T/TH/SAT dialyis, Dr Hinda Lenis  . Gout   . History of nuclear stress test 07/04/2010    dipyridamole; mod fixed inferolateral defect; non-diagnostic for ischemia; low risk scan   . Hyperbilirubinemia 08/10/2013   Medications:  Scheduled:  . allopurinol  100 mg Oral Daily  . amiodarone  100 mg Oral Daily  . atorvastatin  20 mg Oral q1800  . Chlorhexidine Gluconate Cloth  6 each Topical Q0600  . diltiazem  300 mg Oral Daily  . [START ON 09/24/2013] epoetin alfa  14,000 Units Intravenous 3 times weekly  .  insulin aspart  0-9 Units Subcutaneous TID WC  . metoprolol tartrate  25 mg Oral TID  . mupirocin ointment  1 application Nasal BID  . pantoprazole  40 mg Oral BID  . piperacillin-tazobactam (ZOSYN)  IV  2.25 g Intravenous Q8H  . sevelamer carbonate  800 mg Oral TID WC  . sodium chloride  3 mL Intravenous Q12H  . traZODone  50 mg Oral QHS   Assessment: Traumatic dehiscence of right BKA wound post mechanical fall. Wound culture with staph aureus and gram negative rods Cultures pending Dialysis scheduled for today Tue-Thur-Sat at this point  Goal of Therapy:  Pre-Hemodialysis Vancomycin level goal range =15-25 mcg/ml  Plan:  Vancomycin 1 GM IV x 1 dose after each dialysis        Pre-Hemodialysis Vancomycin level when indicated Continue Zosyn 2.5 GM IV every 8 hours Labs per protocol   [name] A 09/22/2013,9:47 AM

## 2013-09-22 NOTE — Progress Notes (Signed)
UR chart review completed.  

## 2013-09-22 NOTE — Progress Notes (Signed)
Subjective: Interval History: Patient offers no complaint. He denies any difficulty in breathing.  Objective: Vital signs in last 24 hours: Temp:  [98 F (36.7 C)-98.7 F (37.1 C)] 98.6 F (37 C) (01/26 0740) Pulse Rate:  [94-124] 94 (01/26 0600) Resp:  [12-25] 22 (01/26 0800) BP: (93-125)/(59-80) 112/63 mmHg (01/26 0800) SpO2:  [88 %-94 %] 88 % (01/26 0600) Weight:  [95 kg (209 lb 7 oz)] 95 kg (209 lb 7 oz) (01/26 0500) Weight change: 0.8 kg (1 lb 12.2 oz)  Intake/Output from previous day: 01/25 0701 - 01/26 0700 In: 1082 [P.O.:360; I.V.:572; IV Piggyback:150] Out: 10 [Drains:10] Intake/Output this shift: Total I/O In: 169 [P.O.:120; I.V.:49] Out: -   General appearance: alert, cooperative and no distress Resp: clear to auscultation bilaterally Cardio: regular rate and rhythm, S1, S2 normal, no murmur, click, rub or gallop GI: soft, non-tender; bowel sounds normal; no masses,  no organomegaly Extremities: extremities normal, atraumatic, no cyanosis or edema  Lab Results:  Recent Labs  09/21/13 0500 09/22/13 0517  WBC 9.7 9.7  HGB 8.5* 8.6*  HCT 28.1* 26.3*  PLT 181 185   BMET:   Recent Labs  09/20/13 0522 09/21/13 0500  NA 139 139  K 4.8 4.6  CL 98 95*  CO2 25 29  GLUCOSE 127* 117*  BUN 40* 23  CREATININE 8.66* 6.01*  CALCIUM 9.7 9.5   No results found for this basename: PTH,  in the last 72 hours Iron Studies: No results found for this basename: IRON, TIBC, TRANSFERRIN, FERRITIN,  in the last 72 hours  Studies/Results: No results found.  I have reviewed the patient's current medications.  Assessment/Plan: Problem #1 end-stage renal disease s/p  Dialysis the day before  yesterday . His BUN is 23 and his  Creatinine is 6.01  and potassium is 4.6.Patient does not have sign and symptoms of uremia Problem #2 after fibrillation: his heart rate is controlled. On heparin/diltiazem/amiodrone/loptrddor Problem #3 history of fall and wound dehiscence of his  BKA. S/P revision of his wound wound culture MRSA and p. Mirabilus omn antibiotics . Presently afebrile and white blood cell count is normal Problem #4 anemia: His hemoglobin and hematocrit is low and patient is on Epogen. stable Problem #5 carotid disease: Status post CABG is a symptomatic.  Problem #6 diabetes Problem #7 metabolic bone disease : His calcium and phosphorus is in range Plan: We'll continue with present treatment We will check Basic metabolic panel  in am For dialysis in am Increase Epogen to 14000 units after daiysis  LOS: 5 days   Page Lancon S 09/22/2013,9:29 AM

## 2013-09-22 NOTE — Progress Notes (Signed)
Report called to 3rd floor to Abby .

## 2013-09-22 NOTE — Progress Notes (Signed)
Patient transferred to floor. We'll redress right lower extremity in a.m.

## 2013-09-22 NOTE — Progress Notes (Signed)
[facility] PROGRESS NOTE  [name] [name] DDU:[id number] DOB: 11-02-1950 DOA: 09/17/2013 PCP: [name], MD  Summary: 63 year old man hospitalized 07/2013 for sepsis, osteomyelitis of the right foot, underwent BKA. Today he fell on the right BKA stump resulting in rupture of wound. Because of medical complexity, medical admission was requested. He underwent primary closure of the wound without apparent complication. Wound culture now growing staph aureus and gram-negative rods.  Assessment/Plan: 1. Traumatic dehiscence of right BKA wound status post mechanical fall. Status post primary closure 1/23. Wound culture obtained in the emergency department  with MRSA and Proteus. See discussion below. 2. Acute blood loss anemia superimposed on anemia of chronic disease. Perioperative.  Appears to have stabilized. 3. Atrial fibrillation. Mild tachycardia but there is stable. Continue amiodarone, metoprolol, diltiazem.  4. End-stage renal disease, continue hemodialysis. 5. Diabetes mellitus type 2, stable. Continue sliding scale. 6. History coronary artery disease, CABG 2008, status post bioprosthetic mitral valve replacement 2008. Negative nuclear stress test 11/2011.  Asymptomatic. 7. History of systolic congestive heart failure, last EF reportedly 40-45% June 2004. Appears well compensated.   Overall appears clinically stable. Continues on empiric antibiotics for positive wound culture, the significance of which is unclear given the circumstances. Will discuss with surgery and infectious disease today. For now continue current antibiotics.  Monitor for bleeding. Hemoglobin stable.  Increase diltiazem  Transfer to medical bed  Disposition ultimately will depend on final surgical recommendations.  Microbiology:   Wound culture--Proteus mirabilis sensitive to cefazolin, cephalosporins, imipenem, Zosyn. MRSA sensitive to Bactrim, tetracycline, vancomycin.  Code Status: full code DVT  prophylaxis: on heparin Family Communication: none present Disposition Plan: home when improved  [name], MD  [facility]  Pager [phone] If 7PM-7AM, please contact night-coverage at [email or web address], password [facility] 09/22/2013, 8:56 AM  LOS: 5 days   Consultants:  General surgery  Nephrology  Procedures:  Routine hemodialysis  1/23 STUMP REVISION   Antibiotics:  Zosyn 1/24 >>   Vancomycin 1/24 >>   HPI/Subjective: No new issues. He feels okay. Pain fairly well controlled.  Objective: Filed Vitals:   09/22/13 0400 09/22/13 0500 09/22/13 0600 09/22/13 0740  BP: 103/64 114/72 114/66   Pulse:   94   Temp: 98 F (36.7 C)   98.6 F (37 C)  TempSrc: Axillary   Axillary  Resp: 12 15 12    Height:      Weight:  95 kg (209 lb 7 oz)    SpO2:   88%     Intake/Output Summary (Last 24 hours) at 09/22/13 0856 Last data filed at 09/22/13 0851  Gross per 24 hour  Intake   1202 ml  Output     10 ml  Net   1192 ml     Filed Weights   09/20/13 0600 09/21/13 0500 09/22/13 0500  Weight: 100.608 kg (221 lb 12.8 oz) 94.2 kg (207 lb 10.8 oz) 95 kg (209 lb 7 oz)    Exam:   Afebrile, vital signs stable. Heart rate 110-120s.  General: Calm and comfortable.  Psychiatric: Grossly normal mood and affect. Speech fluent and appropriate.  Cardiovascular: Tachycardic, regular rhythm. No murmur, rub or gallop.  Respiratory: Clear to auscultation bilaterally. No wheezes, rales or rhonchi. Normal respiratory effort.  Data Reviewed:  Capillary blood sugars stable.  Hemoglobin stable 8.6.  INR without change, 1.75.  Scheduled Meds: . allopurinol  100 mg Oral Daily  . amiodarone  100 mg Oral Daily  . atorvastatin  20 mg Oral q1800  .  Chlorhexidine Gluconate Cloth  6 each Topical Q0600  . diltiazem  240 mg Oral Daily  . epoetin alfa  4,000 Units Intravenous 3 times weekly  . insulin aspart  0-9 Units Subcutaneous TID WC  . metoprolol tartrate  25 mg Oral TID   . mupirocin ointment  1 application Nasal BID  . pantoprazole  40 mg Oral BID  . piperacillin-tazobactam (ZOSYN)  IV  2.25 g Intravenous Q8H  . sevelamer carbonate  800 mg Oral TID WC  . sodium chloride  3 mL Intravenous Q12H  . traZODone  50 mg Oral QHS   Continuous Infusions: . heparin 1,900 Units/hr (09/21/13 2058)    Principal Problem:   Traumatic wound dehiscence Active Problems:   Atrial fibrillation   [place] term (current) use of anticoagulants   CAD (coronary artery disease)   S/P CABG (coronary artery bypass graft)   H/O mitral valve replacement   ESRD (end stage renal disease) on dialysis   DM type 2 causing ESRD   Anemia of chronic disease   Fall   Acute blood loss anemia   Time spent 35 greater than 50% in counseling and coordination of care. minutes

## 2013-09-22 NOTE — Progress Notes (Signed)
Surgery/Michaell Grider  Discussed case with Dr. Arnoldo Morale who will resume surgical care.  Signing off.

## 2013-09-22 NOTE — Evaluation (Signed)
Physical Therapy Evaluation Patient Details Name: [name] [name] MRN: [id number] DOB: [birth year] Today's Date: 09/22/2013 Time: 7628-3151 PT Time Calculation (min): 42 min  PT Assessment / Plan / Recommendation History of Present Illness  Pt had an unfortunate fall after returning home from SNF, receiving rehab post right BKA.  He opened the stump closure at that time and has had to undergo a revision of the stump.  He is on maintenance dialysis T-TH-SA.  Pt lives with his wife and had been able to ambulate about 3' at a time with a walker.  Clinical Impression   Pt was seen for evaluation.  The ROM in his right knee is WNL as is his strength.  He is able to transfer with min to SBA and able to ambulate with a walker 8'.  He will probably require a w/c for most mobility until he is able to get a prosthetic leg, so his mobility at this point should be adequate for him to be able to transfer to home at d/c.  His current walker has wheels and we would like to have them replaced with standard legs that come with the walker.  This may have to be managed by the HHPT.    PT Assessment  Patient needs continued PT services    Follow Up Recommendations  Home health PT    Does the patient have the potential to tolerate intense rehabilitation      Barriers to Discharge        Equipment Recommendations  Other (comment) (standard legs for walker to replace wheels)    Recommendations for Other Services     Frequency Min 3X/week    Precautions / Restrictions Precautions Precautions: Fall Restrictions Weight Bearing Restrictions: No   Pertinent Vitals/Pain       Mobility  Bed Mobility Overal bed mobility: Modified Independent Transfers Overall transfer level: Needs assistance Equipment used: Standard walker Transfers: Sit to/from Stand Sit to Stand: Min guard General transfer comment: pt was very afraid of standing and walking with a rolling walker so we had him try a standard one...he  was able to walk a short distance. Ambulation/Gait Ambulation/Gait assistance: Supervision Ambulation Distance (Feet): 8 Feet Assistive device: Standard walker Gait Pattern/deviations: WFL(Within Functional Limits)    Exercises     PT Diagnosis: Difficulty walking  PT Problem List: Decreased activity tolerance;Decreased mobility PT Treatment Interventions: Gait training;Functional mobility training;Therapeutic exercise     PT Goals(Current goals can be found in the care plan section) Acute Rehab PT Goals Patient Stated Goal: return to independence at home PT Goal Formulation: With patient Time For Goal Achievement: 11/06/13 Potential to Achieve Goals: Good  Visit Information  Last PT Received On: 09/22/13 History of Present Illness: Pt had an unfortunate fall after returning home from SNF, receiving rehab post right BKA.  He opened the stump closure at that time and has had to undergo a revision of the stump.  He is on maintenance dialysis T-TH-SA.  Pt lives with his wife and had been able to ambulate about 35' at a time with a walker.       Prior [place] expects to be discharged to:: Private residence Living Arrangements: Spouse/significant other Available Help at Discharge: Family;Available 24 hours/day Type of Home: House Home Access: Ramped entrance Home Layout: One level Home Equipment: Walker - 2 wheels;Wheelchair - manual;Shower seat;Bedside commode Additional Comments: pt is more stable with a standard walker and we would like to have his walker modified.  Prior Function Level of Independence: Needs assistance Gait / Transfers Assistance Needed: independent with a walker ADL's / Homemaking Assistance Needed: assist with bathing and dressing Communication Communication: No difficulties    Cognition  Cognition Arousal/Alertness: Awake/alert Behavior During Therapy: WFL for tasks assessed/performed Overall Cognitive Status: Within  Functional Limits for tasks assessed    Extremity/Trunk Assessment Lower Extremity Assessment Lower Extremity Assessment: RLE deficits/detail RLE Deficits / Details: right BKA with ROM to WNL, strength to WNL...drain is in place at wound...stump is wrapped with Ace Bandage and He has a knee immobilizer on as protection Cervical / Trunk Assessment Cervical / Trunk Assessment: Kyphotic   Balance Balance Overall balance assessment: Needs assistance Standing balance support: Single extremity supported;Bilateral upper extremity supported Standing balance-Leahy Scale: Poor  End of Session PT - End of Session Equipment Utilized During Treatment: Gait belt Activity Tolerance: Patient tolerated treatment well Patient left: in bed;with call bell/phone within reach;with bed alarm set Nurse Communication: Mobility status  GP     [name] 09/22/2013, 4:11 PM

## 2013-09-22 NOTE — Progress Notes (Signed)
[place] for heparin Indication: atrial fibrillation  Allergies  Allergen Reactions  . Bacitracin Other (See Comments)    unknown  . Norvasc [Amlodipine Besylate] Other (See Comments)    unknown   Patient Measurements: Height: 5\' 11"  (180.3 cm) Weight: 209 lb 7 oz (95 kg) IBW/kg (Calculated) : 75.3 Heparin Dosing Weight: 80kg  Vital Signs: Temp: 98 F (36.7 C) (01/26 0400) Temp src: Axillary (01/26 0400) BP: 114/66 mmHg (01/26 0600) Pulse Rate: 94 (01/26 0600)  Labs:  Recent Labs  09/20/13 0522 09/20/13 0923 09/20/13 0924 09/21/13 0500 09/22/13 0517  HGB 8.9*  --   --  8.5* 8.6*  HCT 28.8*  --   --  28.1* 26.3*  PLT 174  --   --  181 185  LABPROT  --  19.5*  --  19.9* 19.9*  INR  --  1.70*  --  1.75* 1.75*  HEPARINUNFRC  --   --  0.39 0.37 0.55  CREATININE 8.66*  --   --  6.01*  --    Estimated Creatinine Clearance: 14.6 ml/min (by C-G formula based on Cr of 6.01).  Medical History: Past Medical History  Diagnosis Date  . UGI bleed 02/12/11    on anticoagulation; EGD w/snare polypectomy-multiple polypoid lesions antrum(benign), Chronic active gastritis (NEGATIVE H pylori)  . Anemia, chronic disease   . Sepsis due to enterococcus 02/11/11  . Atrial fibrillation     coumadin  . CAD (coronary artery disease)     s/p CABGx4  . Cardiomyopathy     40-45% EF 6/12  . Diabetes mellitus   . Hypertension   . Hyperlipemia   . Gout   . S/P patent foramen ovale closure   . History of nuclear stress test 11/2011    negative myoview  . End stage kidney disease     T/TH/SAT dialyis, Dr Hinda Lenis  . Gout   . History of nuclear stress test 07/04/2010    dipyridamole; mod fixed inferolateral defect; non-diagnostic for ischemia; low risk scan   . Hyperbilirubinemia 08/10/2013   Medications:  Scheduled:  . allopurinol  100 mg Oral Daily  . amiodarone  100 mg Oral Daily  . atorvastatin  20 mg Oral q1800  . Chlorhexidine Gluconate  Cloth  6 each Topical Q0600  . diltiazem  240 mg Oral Daily  . epoetin alfa  4,000 Units Intravenous 3 times weekly  . insulin aspart  0-9 Units Subcutaneous TID WC  . metoprolol tartrate  25 mg Oral TID  . mupirocin ointment  1 application Nasal BID  . pantoprazole  40 mg Oral BID  . piperacillin-tazobactam (ZOSYN)  IV  2.25 g Intravenous Q8H  . sevelamer carbonate  800 mg Oral TID WC  . sodium chloride  3 mL Intravenous Q12H  . traZODone  50 mg Oral QHS   Assessment: 63 yo M on chronic warfarin for Afib.  He is admitted s/p fall with plans for OR 09/19/2013.  INR 1.93 on admission.   No active bleeding.  CBC consistent with anemia of chronic disease.  Heparin restarted after surgery.  CBC appears stable. Heparin level at goal   Goal of Therapy:  Heparin level 0.3-0.7 units/ml Monitor platelets by anticoagulation protocol: Yes   Plan:  Continue heparin infusion at 1900 units/hr Unfractionated heparin level daily Monitor CBC   Eda Magnussen A 09/22/2013,7:53 AM

## 2013-09-23 LAB — BASIC METABOLIC PANEL
BUN: 48 mg/dL — ABNORMAL HIGH (ref 6–23)
CALCIUM: 9.7 mg/dL (ref 8.4–10.5)
CHLORIDE: 94 meq/L — AB (ref 96–112)
CO2: 26 mEq/L (ref 19–32)
Creatinine, Ser: 10.89 mg/dL — ABNORMAL HIGH (ref 0.50–1.35)
GFR calc non Af Amer: 4 mL/min — ABNORMAL LOW (ref >90)
GFR, EST AFRICAN AMERICAN: 5 mL/min — AB (ref >90)
Glucose, Bld: 107 mg/dL — ABNORMAL HIGH (ref 70–99)
Potassium: 5.2 mEq/L (ref 3.7–5.3)
Sodium: 139 mEq/L (ref 137–147)

## 2013-09-23 LAB — GLUCOSE, CAPILLARY
Glucose-Capillary: 96 mg/dL (ref 70–99)
Glucose-Capillary: 150 mg/dL — ABNORMAL HIGH (ref 70–99)
Glucose-Capillary: 100 mg/dL — ABNORMAL HIGH (ref 70–99)

## 2013-09-23 LAB — HEPARIN LEVEL (UNFRACTIONATED): HEPARIN UNFRACTIONATED: 0.47 [IU]/mL (ref 0.30–0.70)

## 2013-09-23 LAB — CBC
HEMATOCRIT: 24.5 % — AB (ref 39.0–52.0)
Hemoglobin: 8 g/dL — ABNORMAL LOW (ref 13.0–17.0)
MCH: 30.2 pg (ref 26.0–34.0)
MCHC: 32.7 g/dL (ref 30.0–36.0)
MCV: 92.5 fL (ref 78.0–100.0)
PLATELETS: 173 10*3/uL (ref 150–400)
RBC: 2.65 MIL/uL — ABNORMAL LOW (ref 4.22–5.81)
RDW: 16.3 % — AB (ref 11.5–15.5)
WBC: 8.8 10*3/uL (ref 4.0–10.5)

## 2013-09-23 LAB — PROTIME-INR
INR: 1.93 — AB (ref 0.00–1.49)
Prothrombin Time: 21.5 seconds — ABNORMAL HIGH (ref 11.6–15.2)

## 2013-09-23 MED ORDER — EPOETIN ALFA 20000 UNIT/ML IJ SOLN
14000.0000 [IU] | INTRAMUSCULAR | Status: DC
  Filled 2013-09-23: qty 1

## 2013-09-23 MED ORDER — ALTEPLASE 2 MG IJ SOLR
2.0000 mg | Freq: Once | INTRAMUSCULAR | Status: AC | PRN
  Filled 2013-09-23: qty 2

## 2013-09-23 MED ORDER — SODIUM CHLORIDE 0.9 % IV SOLN: 100.0000 mL | INTRAVENOUS | Status: DC | PRN

## 2013-09-23 MED ORDER — WARFARIN - PHARMACIST DOSING INPATIENT: Status: DC

## 2013-09-23 MED ORDER — WARFARIN SODIUM 1 MG PO TABS
1.0000 mg | ORAL_TABLET | Freq: Once | ORAL | Status: AC
  Administered 2013-09-23: 1 mg via ORAL
  Filled 2013-09-23: qty 1

## 2013-09-23 MED ORDER — EPOETIN ALFA 20000 UNIT/ML IJ SOLN
14000.0000 [IU] | INTRAMUSCULAR | Status: DC
  Administered 2013-09-23: 14000 [IU] via INTRAVENOUS
  Filled 2013-09-23: qty 1

## 2013-09-23 MED ORDER — DEXTROSE 5 % IV SOLN
1.0000 g | INTRAVENOUS | Status: DC
  Filled 2013-09-23 (×2): qty 10

## 2013-09-23 NOTE — Discharge Summary (Signed)
Physician Discharge Summary  [name] [name] CHE:[id number] DOB: 03-08-1951 DOA: 09/17/2013  PCP: [name], MD  Admit date: 09/17/2013 Discharge date: 09/24/2013 Recommendations for Outpatient Follow-up:  1. Traumatic wound dehiscence right BKA, status post primary closure. Home health physical therapy and wound care.  2. Acute blood loss anemia superimposed on anemia of chronic disease. Consider outpatient CBC on followup.  Follow-up Information   Follow up with [name], MD In 1 week.   Specialty:  Internal Medicine   Contact information:   [address] [place] [address] [phone]       Follow up with [name], MD. (as directed, call his office)    Specialty:  General Surgery   Contact information:   [address] [facility] [address] [phone]      Discharge Diagnoses:  1. Traumatic dehiscence of right BKA wound status post mechanical fall 2. Acute blood loss anemia superimposed on anemia of chronic disease 3. Atrial fibrillation 4. End-stage renal disease 5. Diabetes mellitus type 2  Discharge Condition: Improved Disposition: Home with home health physical therapy and RN for wound care  Diet recommendation: Heart healthy diabetic diet  Filed Weights   09/22/13 0500 09/22/13 1407 09/23/13 1015  Weight: 95 kg (209 lb 7 oz) 97.614 kg (215 lb 3.2 oz) 93 kg (205 lb 0.4 oz)    History of present illness:  63 year old man hospitalized 07/2013 for sepsis, osteomyelitis of the right foot, underwent BKA at that time. Presented to ED after fall on stump resulting in rupture of wound. Because of medical complexity, medical admission was requested.   Hospital Course:  He underwent primary closure of the wound without apparent complication. Cultures were obtained which were positive but felt to be insignificant as below. He has done well postoperatively with only major complication being acute blood loss anemia which  has not require blood products. Atrial fibrillation with borderline control although overall appears to be stabilizing on adjusted medications. Expect spontaneous improvement as condition improves. He is clinically improving, plan discharge home 1/28 with home health.  1. Traumatic dehiscence of right BKA wound status post mechanical fall. Status post primary closure 1/23. Wound culture obtained in the emergency department with MRSA and Proteus--discussed with Dr. Linus Salmons, culture is insignificant, rec stop all abx.  2. Acute blood loss anemia superimposed on anemia of chronic disease. Perioperative, now stable.  3. Atrial fibrillation. Mild tachycardia but this is stable and should improve as condition improves. Continue amiodarone, metoprolol, diltiazem.  4. End-stage renal disease, continue hemodialysis. 5. Diabetes mellitus type 2, stable. Continue sliding scale. 6. History coronary artery disease, CABG 2008, status post bioprosthetic mitral valve replacement 2008. Negative nuclear stress test 11/2011. Asymptomatic. 7. History of systolic congestive heart failure, last EF reportedly 40-45% June 2004. Appears well compensated.  Consultants:  General surgery  Nephrology  Physical therapy: Home health. Procedures:  Routine hemodialysis 1/23 STUMP REVISION  Antibiotics:  Zosyn 1/24 >> 1/27  Vancomycin 1/24 >> 1/27  Discharge Instructions  Discharge Orders   Future Orders Complete By Expires   Diet - low sodium heart healthy  As directed    Diet Carb Modified  As directed    Discharge instructions  As directed    Comments:     Call physician or seek immediate medical attention for fever, increased pain or drainage from wound. Activity per Dr. Arnoldo Morale and physical therapy.       Medication List         allopurinol  100 MG tablet  Commonly known as:  ZYLOPRIM  Take 100 mg by mouth daily.     amiodarone 200 MG tablet  Commonly known as:  PACERONE  Take 0.5 tablets (100 mg total) by  mouth daily.     aspirin 81 MG tablet  Take 81 mg by mouth daily.     atorvastatin 20 MG tablet  Commonly known as:  LIPITOR  Take 20 mg by mouth daily at 6 PM.     DIALYVITE/ZINC PO  Take by mouth 3 (three) times a week. Patient has dialysis on Tuesdays, Thursdays, and Saturdays     diltiazem 180 MG 24 hr capsule  Commonly known as:  CARDIZEM CD  Take 1 capsule (180 mg total) by mouth daily.     folic acid 1 MG tablet  Commonly known as:  FOLVITE  Take 1 mg by mouth daily.     glimepiride 4 MG tablet  Commonly known as:  AMARYL  Take 4 mg by mouth 2 (two) times daily.     insulin glargine 100 UNIT/ML injection  Commonly known as:  LANTUS  Inject 10 Units into the skin at bedtime.     metoprolol tartrate 25 MG tablet  Commonly known as:  LOPRESSOR  Take 1 tablet (25 mg total) by mouth 3 (three) times daily.     multivitamin capsule  Take 1 capsule by mouth daily.     pantoprazole 40 MG tablet  Commonly known as:  PROTONIX  Take 40 mg by mouth 2 (two) times daily.     RENVELA 800 MG tablet  Generic drug:  sevelamer carbonate  Take 800 mg by mouth 3 (three) times daily with meals.     traZODone 50 MG tablet  Commonly known as:  DESYREL  Take 1 tablet (50 mg total) by mouth at bedtime.     warfarin 1 MG tablet  Commonly known as:  COUMADIN  Take 1-1.5 mg by mouth daily at 6 PM. Takes 1 tablet (1 mg) Monday thru Friday and takes 1.5 tablets (1.5 mg) on Saturday and Sunday       Allergies  Allergen Reactions  . Bacitracin Other (See Comments)    unknown  . Norvasc [Amlodipine Besylate] Other (See Comments)    unknown    The results of significant diagnostics from this hospitalization (including imaging, microbiology, ancillary and laboratory) are listed below for reference.    Significant Diagnostic Studies: Dg Chest 2 View  09/17/2013   CLINICAL DATA:  Preoperative films.  EXAM: CHEST  2 VIEW  COMPARISON:  Single view of the chest 08/12/2013.  FINDINGS:  There is cardiomegaly and vascular congestion. Right pleural effusion and basilar airspace disease seen on the prior study have markedly improved with only a small residual effusion identified. No pneumothorax.  IMPRESSION: Cardiomegaly and mild vascular congestion.  Small right pleural effusion and basilar atelectasis are markedly improved since the prior study.   Electronically Signed   By: Inge Rise M.D.   On: 09/17/2013 09:35    Microbiology: Recent Results (from the past 240 hour(s))  WOUND CULTURE     Status: None   Collection Time    09/17/13  9:00 AM      Result Value Range Status   Specimen Description WOUND LEG   Final   Special Requests NONE   Final   Gram Stain     Final   Value: RARE WBC PRESENT, PREDOMINANTLY PMN     NO SQUAMOUS EPITHELIAL CELLS SEEN  NO ORGANISMS SEEN     Performed at [facility]   Culture     Final   Value: FEW METHICILLIN RESISTANT STAPHYLOCOCCUS AUREUS     Note: RIFAMPIN AND GENTAMICIN SHOULD NOT BE USED AS SINGLE DRUGS FOR TREATMENT OF STAPH INFECTIONS. This organism is presumed to be Clindamycin resistant based on detection of inducible Clindamycin resistance. CRITICAL RESULT CALLED TO, READ BACK BY AND      VERIFIED WITH: N. ROWE 1/24 @1015  BY REAMM     FEW PROTEUS MIRABILIS     Performed at [facility]   Report Status 09/20/2013 FINAL   Final   Organism ID, Bacteria METHICILLIN RESISTANT STAPHYLOCOCCUS AUREUS   Final   Organism ID, Bacteria PROTEUS MIRABILIS   Final  MRSA PCR SCREENING     Status: Abnormal   Collection Time    09/18/13 12:10 AM      Result Value Range Status   MRSA by PCR POSITIVE (*) NEGATIVE Final   Comment:            The GeneXpert MRSA Assay (FDA     approved for NASAL specimens     only), is one component of a     comprehensive MRSA colonization     surveillance program. It is not     intended to diagnose MRSA     infection nor to guide or     monitor treatment for     MRSA infections.      RESULT CALLED TO, READ BACK BY AND VERIFIED WITH:     MARTIN C AT 0203 ON [id number] BY FORSYTH K     Labs: Basic Metabolic Panel:  Recent Labs Lab 09/17/13 0812 09/18/13 0700 09/20/13 0522 09/21/13 0500 09/23/13 0638  NA 145 142 139 139 139  K 4.6 3.6* 4.8 4.6 5.2  CL 101 100 98 95* 94*  CO2 28 27 25 29 26   GLUCOSE 92 91 127* 117* 107*  BUN 34* 22 40* 23 48*  CREATININE 7.11* 4.77* 8.66* 6.01* 10.89*  CALCIUM 10.2 9.6 9.7 9.5 9.7  PHOS  --   --   --  3.8  --    CBC:  Recent Labs Lab 09/17/13 0812 09/18/13 0700 09/19/13 1325 09/20/13 0522 09/21/13 0500 09/22/13 0517 09/23/13 0638  WBC 7.5 7.9  --  10.1 9.7 9.7 8.8  NEUTROABS 5.7  --   --   --   --   --   --   HGB 10.1* 9.9* 9.8* 8.9* 8.5* 8.6* 8.0*  HCT 32.4* 31.1* 31.3* 28.8* 28.1* 26.3* 24.5*  MCV 94.5 93.1  --  94.7 95.9 92.9 92.5  PLT 167 175  --  174 181 185 173   CBG:  Recent Labs Lab 09/22/13 1118 09/22/13 1640 09/22/13 2132 09/23/13 0754 09/23/13 1605  GLUCAP 130* 124* 101* 96 150*    Principal Problem:   Traumatic wound dehiscence Active Problems:   Atrial fibrillation   [place] term (current) use of anticoagulants   CAD (coronary artery disease)   S/P CABG (coronary artery bypass graft)   H/O mitral valve replacement   ESRD (end stage renal disease) on dialysis   DM type 2 causing ESRD   Anemia of chronic disease   Fall   Acute blood loss anemia   Time coordinating discharge: 35 minutes  Signed:  [name], MD [facility] 09/23/2013, 4:32 PM

## 2013-09-23 NOTE — Progress Notes (Signed)
4 Days Post-Op  Subjective:  on dialysis currently. No significant incisional pain.  Objective: Vital signs in last 24 hours: Temp:  [98.1 F (36.7 C)-98.5 F (36.9 C)] 98.1 F (36.7 C) (01/27 1015) Pulse Rate:  [81-115] 108 (01/27 1345) Resp:  [16-20] 16 (01/27 1015) BP: (87-110)/(44-71) 99/44 mmHg (01/27 1345) SpO2:  [94 %-95 %] 95 % (01/27 1015) Weight:  [93 kg (205 lb 0.4 oz)] 93 kg (205 lb 0.4 oz) (01/27 1015) Last BM Date: 09/22/13  Intake/Output from previous day: 01/26 0701 - 01/27 0700 In: 1209 [P.O.:600; I.V.:459; IV Piggyback:150] Out: 250 [Urine:250] Intake/Output this shift: Total I/O In: 240 [P.O.:240] Out: -   General appearance: alert, cooperative and no distress Extremities: Right lower extremity wound intact. Small amount of sanguinous drainage the JP drain. No purulent drainage noted. Mild swelling of stump present, not unexpected.  Lab Results:   Recent Labs  09/22/13 0517 09/23/13 0638  WBC 9.7 8.8  HGB 8.6* 8.0*  HCT 26.3* 24.5*  PLT 185 173   BMET  Recent Labs  09/21/13 0500 09/23/13 0638  NA 139 139  K 4.6 5.2  CL 95* 94*  CO2 29 26  GLUCOSE 117* 107*  BUN 23 48*  CREATININE 6.01* 10.89*  CALCIUM 9.5 9.7   PT/INR  Recent Labs  09/22/13 0517 09/23/13 0638  LABPROT 19.9* 21.5*  INR 1.75* 1.93*    Studies/Results: No results found.  Anti-infectives: Anti-infectives   Start     Dose/Rate Route Frequency Ordered Stop   09/23/13 1200  vancomycin (VANCOCIN) IVPB 1000 mg/200 mL premix     1,000 mg 200 mL/hr over 60 Minutes Intravenous Every T-Th-Sa (Hemodialysis) 09/22/13 0950     09/23/13 0745  cefTRIAXone (ROCEPHIN) 1 g in dextrose 5 % 50 mL IVPB     1 g 100 mL/hr over 30 Minutes Intravenous Every 24 hours 09/23/13 0733     09/22/13 2300  piperacillin-tazobactam (ZOSYN) IVPB 2.25 g  Status:  Discontinued     2.25 g 100 mL/hr over 30 Minutes Intravenous Every 8 hours 09/22/13 1500 09/23/13 0733   09/20/13 1800   vancomycin (VANCOCIN) IVPB 1000 mg/200 mL premix     1,000 mg 200 mL/hr over 60 Minutes Intravenous  Once 09/20/13 1103 09/20/13 1900   09/19/13 2100  vancomycin (VANCOCIN) 1,500 mg in sodium chloride 0.9 % 500 mL IVPB     1,500 mg 250 mL/hr over 120 Minutes Intravenous  Once 09/19/13 1906 09/19/13 2327   09/19/13 2100  piperacillin-tazobactam (ZOSYN) IVPB 2.25 g  Status:  Discontinued     2.25 g 100 mL/hr over 30 Minutes Intravenous Every 8 hours 09/19/13 1906 09/22/13 1500   09/19/13 1300  ceFAZolin (ANCEF) IVPB 1 g/50 mL premix  Status:  Discontinued     1 g 100 mL/hr over 30 Minutes Intravenous Every 6 hours 09/19/13 1201 09/19/13 1827   09/19/13 0745  ceFAZolin (ANCEF) powder 1 g  Status:  Discontinued     1 g Other To Surgery 09/19/13 0744 09/19/13 1200   09/17/13 0815  ciprofloxacin (CIPRO) IVPB 400 mg     400 mg 200 mL/hr over 60 Minutes Intravenous  Once 09/17/13 0803 09/17/13 0948      Assessment/Plan: s/p Procedure(s): STUMP REVISION Impression: Wound healing well. Plan: Patient already has home health assistance. It is okay for discharge from my standpoint. With the JP drain in place. Will discuss with home health wound care.  LOS: 6 days    Eris Hannan A 09/23/2013

## 2013-09-23 NOTE — Progress Notes (Signed)
[name] [name] for heparin >> Coumadin Indication: atrial fibrillation  Allergies  Allergen Reactions  . Bacitracin Other (See Comments)    unknown  . Norvasc [Amlodipine Besylate] Other (See Comments)    unknown   Patient Measurements: Height: 6\' 2"  (188 cm) (6'2") Weight: 205 lb 0.4 oz (93 kg) IBW/kg (Calculated) : 82.2 Heparin Dosing Weight: 80kg  Vital Signs: Temp: 98 F (36.7 C) (01/27 1435) Temp src: Oral (01/27 1435) BP: 98/56 mmHg (01/27 1435) Pulse Rate: 106 (01/27 1435)  Labs:  Recent Labs  09/21/13 0500 09/22/13 0517 09/23/13 0638  HGB 8.5* 8.6* 8.0*  HCT 28.1* 26.3* 24.5*  PLT 181 185 173  LABPROT 19.9* 19.9* 21.5*  INR 1.75* 1.75* 1.93*  HEPARINUNFRC 0.37 0.55 0.47  CREATININE 6.01*  --  10.89*   Estimated Creatinine Clearance: 8 ml/min (by C-G formula based on Cr of 10.89).  Medical History: Past Medical History  Diagnosis Date  . UGI bleed 02/12/11    on anticoagulation; EGD w/snare polypectomy-multiple polypoid lesions antrum(benign), Chronic active gastritis (NEGATIVE H pylori)  . Anemia, chronic disease   . Sepsis due to enterococcus 02/11/11  . Atrial fibrillation     coumadin  . CAD (coronary artery disease)     s/p CABGx4  . Cardiomyopathy     40-45% EF 6/12  . Diabetes mellitus   . Hypertension   . Hyperlipemia   . Gout   . S/P patent foramen ovale closure   . History of nuclear stress test 11/2011    negative myoview  . End stage kidney disease     T/TH/SAT dialyis, Dr Hinda Lenis  . Gout   . History of nuclear stress test 07/04/2010    dipyridamole; mod fixed inferolateral defect; non-diagnostic for ischemia; low risk scan   . Hyperbilirubinemia 08/10/2013   Medications:  Scheduled:  . allopurinol  100 mg Oral Daily  . amiodarone  100 mg Oral Daily  . atorvastatin  20 mg Oral q1800  . Chlorhexidine Gluconate Cloth  6 each Topical Q0600  . diltiazem  300 mg Oral Daily  . [START ON 09/25/2013]  epoetin (EPOGEN/PROCRIT) injection  14,000 Units Intravenous Q T,Th,Sa-HD  . insulin aspart  0-9 Units Subcutaneous TID WC  . metoprolol tartrate  25 mg Oral TID  . mupirocin ointment  1 application Nasal BID  . pantoprazole  40 mg Oral BID  . sevelamer carbonate  800 mg Oral TID WC  . sodium chloride  3 mL Intravenous Q12H  . traZODone  50 mg Oral QHS  . warfarin  1 mg Oral Once  . [START ON 09/24/2013] Warfarin - Pharmacist Dosing Inpatient   Does not apply Q24H   Assessment: 63 yo M on chronic warfarin for Afib.  He is admitted s/p fall with plans for OR 09/19/2013.  No active bleeding.  CBC consistent with anemia of chronic disease.  Heparin restarted after surgery.  CBC appears stable. Heparin level at goal.  Resume Coumadin today per MD.  Goal of Therapy:  Heparin level 0.3-0.7 units/ml Monitor platelets by anticoagulation protocol: Yes   Plan:  Coumadin 1mg  PO today x 1 INR daily Continue heparin infusion at 1900 units/hr Unfractionated heparin level daily Monitor CBC   [place] Crane, [place] E[place] A 09/23/2013,4:22 PM

## 2013-09-23 NOTE — Procedures (Signed)
   HEMODIALYSIS TREATMENT NOTE:  4 hour HD session completed via right forearm AVF (15g/antegrade).  Heparin infusion at 1900u/h via PIV during HD.  Goal NOT met:  BP unable to tolerate removal of 3 liters as prescribed.  Ultrafiltration was interrupted for SBP<90 (asymptomatic) for a total of 1:31 minutes.  Net UF = 1.5 liters. Dr Theador Hawthorne was notified. All blood was reinfused and hemostasis was achieved within 10 minutes.  Report given to Jomarie Longs, RN.  Micaella Gitto L. Tynisha Ogan, RN, CDN

## 2013-09-23 NOTE — Progress Notes (Signed)
Patient taken down via bed for dialysis at this time

## 2013-09-23 NOTE — Progress Notes (Signed)
[place] for heparin Indication: atrial fibrillation  Allergies  Allergen Reactions  . Bacitracin Other (See Comments)    unknown  . Norvasc [Amlodipine Besylate] Other (See Comments)    unknown   Patient Measurements: Height: 6\' 2"  (188 cm) (6'2") Weight: 215 lb 3.2 oz (97.614 kg) IBW/kg (Calculated) : 82.2 Heparin Dosing Weight: 80kg  Vital Signs: Temp: 98.5 F (36.9 C) (01/26 2135) BP: 106/71 mmHg (01/26 2135) Pulse Rate: 106 (01/26 2135)  Labs:  Recent Labs  09/21/13 0500 09/22/13 0517 09/23/13 0638  HGB 8.5* 8.6* 8.0*  HCT 28.1* 26.3* 24.5*  PLT 181 185 173  LABPROT 19.9* 19.9* 21.5*  INR 1.75* 1.75* 1.93*  HEPARINUNFRC 0.37 0.55 0.47  CREATININE 6.01*  --  10.89*   Estimated Creatinine Clearance: 8 ml/min (by C-G formula based on Cr of 10.89).  Medical History: Past Medical History  Diagnosis Date  . UGI bleed 02/12/11    on anticoagulation; EGD w/snare polypectomy-multiple polypoid lesions antrum(benign), Chronic active gastritis (NEGATIVE H pylori)  . Anemia, chronic disease   . Sepsis due to enterococcus 02/11/11  . Atrial fibrillation     coumadin  . CAD (coronary artery disease)     s/p CABGx4  . Cardiomyopathy     40-45% EF 6/12  . Diabetes mellitus   . Hypertension   . Hyperlipemia   . Gout   . S/P patent foramen ovale closure   . History of nuclear stress test 11/2011    negative myoview  . End stage kidney disease     T/TH/SAT dialyis, Dr Hinda Lenis  . Gout   . History of nuclear stress test 07/04/2010    dipyridamole; mod fixed inferolateral defect; non-diagnostic for ischemia; low risk scan   . Hyperbilirubinemia 08/10/2013   Medications:  Scheduled:  . allopurinol  100 mg Oral Daily  . amiodarone  100 mg Oral Daily  . atorvastatin  20 mg Oral q1800  . cefTRIAXone (ROCEPHIN)  IV  1 g Intravenous Q24H  . Chlorhexidine Gluconate Cloth  6 each Topical Q0600  . diltiazem  300 mg Oral Daily  .  epoetin (EPOGEN/PROCRIT) injection  14,000 Units Subcutaneous Q T,Th,Sa-HD  . insulin aspart  0-9 Units Subcutaneous TID WC  . metoprolol tartrate  25 mg Oral TID  . mupirocin ointment  1 application Nasal BID  . pantoprazole  40 mg Oral BID  . sevelamer carbonate  800 mg Oral TID WC  . sodium chloride  3 mL Intravenous Q12H  . traZODone  50 mg Oral QHS  . vancomycin  1,000 mg Intravenous Q T,Th,Sa-HD   Assessment: 63 yo M on chronic warfarin for Afib.  He is admitted s/p fall with plans for OR 09/19/2013.  INR 1.93 on admission.   No active bleeding.  CBC consistent with anemia of chronic disease.  Heparin restarted after surgery.  CBC appears stable. Heparin level at goal.    Goal of Therapy:  Heparin level 0.3-0.7 units/ml Monitor platelets by anticoagulation protocol: Yes   Plan:  Continue heparin infusion at 1900 units/hr Unfractionated heparin level daily Monitor CBC   Forever Arechiga A 09/23/2013,8:20 AM

## 2013-09-23 NOTE — Progress Notes (Signed)
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 Presented to ED after fall on stump resulting in rupture of wound. Because of medical complexity, medical admission was requested. He underwent primary closure of the wound without apparent complication. Cultures were obtained which were positive but felt to be insignificant as below. He has done well postoperatively with only major complication being acute blood loss anemia which is not require blood products. Atrial fibrillation has been somewhat difficult to control although overall appears to be stabilizing on adjusted medications. He is clinically improving, plan discharge home 1/28 with home health.  Assessment/Plan: 1. Traumatic dehiscence of right BKA wound status post mechanical fall. Status post primary closure 1/23. Wound culture obtained in the emergency department  with MRSA and Proteus--discussed with Dr. Linus Salmons, culture is insignificant, rec stop all abx.  2. Acute blood loss anemia superimposed on anemia of chronic disease. Perioperative, now stable.   3. Atrial fibrillation. Mild tachycardia but this is stable and should improve as condition improves. Continue amiodarone, metoprolol, diltiazem.  4. End-stage renal disease, continue hemodialysis. 5. Diabetes mellitus type 2, stable. Continue sliding scale. 6. History coronary artery disease, CABG 2008, status post bioprosthetic mitral valve replacement 2008. Negative nuclear stress test 11/2011.  Asymptomatic. 7. History of systolic congestive heart failure, last EF reportedly 40-45% June 2004. Appears well compensated.   Overall he continues to improve.   Start heparin infusion. Resume warfarin.  Monitor heart rate today, adjust medications as needed  Anticipate discharge home in the morning  with [facility] RN and PT, followup with PCP in one week. He will need to followup with Dr. Arnoldo Morale as directed.  Code Status: full code DVT prophylaxis: warfarin Family Communication: none present Disposition Plan: home when improved  [name], MD  [facility]  Pager [phone] If 7PM-7AM, please contact night-coverage at [email or web address], password [facility] 09/23/2013, 3:54 PM  LOS: 6 days   Consultants:  General surgery  Nephrology  Physical therapy: Home health.  Procedures:  Routine hemodialysis  1/23 STUMP REVISION   Antibiotics:  Zosyn 1/24 >> 1/27  Vancomycin 1/24 >> once/twice a  HPI/Subjective: Overall he is feeling fine today. Some pain but it is controlled.  Objective: Filed Vitals:   09/23/13 1400 09/23/13 1415 09/23/13 1425 09/23/13 1435  BP: 104/62 86/61 92/56  98/56  Pulse: 120 122 117 106  Temp:    98 F (36.7 C)  TempSrc:    Oral  Resp:    20  Height:      Weight:      SpO2:        Intake/Output Summary (Last 24 hours) at 09/23/13 1554 Last data filed at 09/23/13 1425  Gross per 24 hour  Intake   1280 ml  Output   1843 ml  Net   -563 ml     Filed Weights   09/22/13 0500 09/22/13 1407 09/23/13 1015  Weight: 95 kg (209 lb 7 oz) 97.614 kg (215 lb 3.2 oz) 93 kg (205 lb 0.4 oz)    Exam:   Afebrile, vital signs stable.   General: Appears calm and comfortable.  Cardiovascular slightly tachycardic. Normal rate. No murmur, rub or gallop. Irregular.  Respiratory: Clear to auscultation bilaterally. No wheezes, rales or rhonchi. Normal respiratory effort.  Psychiatric grossly normal mood and affect. Speech fluent and appropriate.  Data Reviewed:  Capillary blood sugars  stable.  Hemoglobin stable 8.0  INR 6.96  Basic metabolic panel deferred to nEPHROLOGY  Scheduled Meds: . allopurinol  100 mg Oral Daily  . amiodarone  100 mg Oral Daily  . atorvastatin  20 mg Oral q1800  . cefTRIAXone (ROCEPHIN)  IV  1 g Intravenous Q24H  .  Chlorhexidine Gluconate Cloth  6 each Topical Q0600  . diltiazem  300 mg Oral Daily  . [START ON 09/25/2013] epoetin (EPOGEN/PROCRIT) injection  14,000 Units Intravenous Q T,Th,Sa-HD  . insulin aspart  0-9 Units Subcutaneous TID WC  . metoprolol tartrate  25 mg Oral TID  . mupirocin ointment  1 application Nasal BID  . pantoprazole  40 mg Oral BID  . sevelamer carbonate  800 mg Oral TID WC  . sodium chloride  3 mL Intravenous Q12H  . traZODone  50 mg Oral QHS  . vancomycin  1,000 mg Intravenous Q T,Th,Sa-HD   Continuous Infusions: . heparin 1,900 Units/hr (09/22/13 2323)    Principal Problem:   Traumatic wound dehiscence Active Problems:   Atrial fibrillation   Biggar term (current) use of anticoagulants   CAD (coronary artery disease)   S/P CABG (coronary artery bypass graft)   H/O mitral valve replacement   ESRD (end stage renal disease) on dialysis   DM type 2 causing ESRD   Anemia of chronic disease   Fall   Acute blood loss anemia

## 2013-09-23 NOTE — Progress Notes (Addendum)
[name] [name]  MRN: [id number]  DOB/AGE: 05/10/1951 63 y.o.  Primary Care Physician:FUSCO,LAWRENCE J., MD  Admit date: 09/17/2013  Chief Complaint:  Chief Complaint  Patient presents with  . Fall    S-Pt presented on  09/17/2013 with  Chief Complaint  Patient presents with  . Fall  .    Pt offers no new complaints    Pt asking about the time when he will be dialyzed today.  Meds  . allopurinol  100 mg Oral Daily  . amiodarone  100 mg Oral Daily  . atorvastatin  20 mg Oral q1800  . cefTRIAXone (ROCEPHIN)  IV  1 g Intravenous Q24H  . Chlorhexidine Gluconate Cloth  6 each Topical Q0600  . diltiazem  300 mg Oral Daily  . epoetin (EPOGEN/PROCRIT) injection  14,000 Units Subcutaneous Q T,Th,Sa-HD  . insulin aspart  0-9 Units Subcutaneous TID WC  . metoprolol tartrate  25 mg Oral TID  . mupirocin ointment  1 application Nasal BID  . pantoprazole  40 mg Oral BID  . sevelamer carbonate  800 mg Oral TID WC  . sodium chloride  3 mL Intravenous Q12H  . traZODone  50 mg Oral QHS  . vancomycin  1,000 mg Intravenous Q T,Th,Sa-HD       Physical Exam: Vital signs in last 24 hours: Temp:  [97.9 F (36.6 C)-98.8 F (37.1 C)] 98.5 F (36.9 C) (01/26 2135) Pulse Rate:  [102-106] 106 (01/26 2135) Resp:  [13-25] 20 (01/26 2135) BP: (93-108)/(60-77) 102/60 mmHg (01/27 0823) SpO2:  [94 %-95 %] 94 % (01/26 2135) Weight:  [215 lb 3.2 oz (97.614 kg)] 215 lb 3.2 oz (97.614 kg) (01/26 1407) Weight change: 5 lb 12.2 oz (2.614 kg) Last BM Date: 09/22/13  Intake/Output from previous day: 01/26 0701 - 01/27 0700 In: 1209 [P.O.:600; I.V.:459; IV Piggyback:150] Out: 250 [Urine:250]     Physical Exam: General- pt is awake,alert, oriented to time place and person  Resp- No acute REsp distress, CTA B/L NO Rhonchi  CVS- S1S2 Irregular in rate and rhythm  GIT- BS+, soft, NT, ND  EXT- NO LE Edema, Cyanosis, Right BKA  Access- AVF+ Bruit    Lab Results: CBC  Recent Labs   09/22/13 0517 09/23/13 0638  WBC 9.7 8.8  HGB 8.6* 8.0*  HCT 26.3* 24.5*  PLT 185 173    BMET  Recent Labs  09/21/13 0500 09/23/13 0638  NA 139 139  K 4.6 5.2  CL 95* 94*  CO2 29 26  GLUCOSE 117* 107*  BUN 23 48*  CREATININE 6.01* 10.89*  CALCIUM 9.5 9.7    MICRO Recent Results (from the past 240 hour(s))  WOUND CULTURE     Status: None   Collection Time    09/17/13  9:00 AM      Result Value Range Status   Specimen Description WOUND LEG   Final   Special Requests NONE   Final   Gram Stain     Final   Value: RARE WBC PRESENT, PREDOMINANTLY PMN     NO SQUAMOUS EPITHELIAL CELLS SEEN     NO ORGANISMS SEEN     Performed at [facility]   Culture     Final   Value: FEW METHICILLIN RESISTANT STAPHYLOCOCCUS AUREUS     Note: RIFAMPIN AND GENTAMICIN SHOULD NOT BE USED AS SINGLE DRUGS FOR TREATMENT OF STAPH INFECTIONS. This organism is presumed to be Clindamycin resistant based on detection of inducible Clindamycin resistance. CRITICAL RESULT CALLED TO,  READ BACK BY AND      VERIFIED WITH: N. ROWE 1/24 @1015  BY REAMM     FEW PROTEUS MIRABILIS     Performed at [facility]   Report Status 09/20/2013 FINAL   Final   Organism ID, Bacteria METHICILLIN RESISTANT STAPHYLOCOCCUS AUREUS   Final   Organism ID, Bacteria PROTEUS MIRABILIS   Final  MRSA PCR SCREENING     Status: Abnormal   Collection Time    09/18/13 12:10 AM      Result Value Range Status   MRSA by PCR POSITIVE (*) NEGATIVE Final   Comment:            The GeneXpert MRSA Assay (FDA     approved for NASAL specimens     only), is one component of a     comprehensive MRSA colonization     surveillance program. It is not     intended to diagnose MRSA     infection nor to guide or     monitor treatment for     MRSA infections.     RESULT CALLED TO, READ BACK BY AND VERIFIED WITH:     MARTIN C AT 0203 ON [id number] BY FORSYTH K      Lab Results  Component Value Date   CALCIUM 9.7 09/23/2013   PHOS  3.8 09/21/2013           Impression: 1)Renal  ESRD on HD  ON TTS schedule  Pt is to be Dialyzed today    2)HTN  Target Organ damage  CKD  CAD  CHF  PVD  Medication-  On Calcium Channel Blockers  On Beta blockers  3)Anemia HGb not at goal (9--11)  On EPO  4)CKD Mineral-Bone Disorder  Phosphorus at goal.  Calcium at goal   5)PVD  Admitted with Wound dehiscence Surgery and Primary MD following   6)Electrolytes  Normokalemic  Normonatremic   7)Acid base  Co2 at goal        Plan:  Will dialyze today.   Addendum Pt seen on dialysis. Pt tolerating tx well.     Edyn Popoca S 09/23/2013, 8:49 AM

## 2013-09-24 DIAGNOSIS — I2510 Atherosclerotic heart disease of native coronary artery without angina pectoris: Secondary | ICD-10-CM

## 2013-09-24 LAB — BASIC METABOLIC PANEL
BUN: 21 mg/dL (ref 6–23)
CO2: 30 mEq/L (ref 19–32)
Calcium: 9.7 mg/dL (ref 8.4–10.5)
Chloride: 97 mEq/L (ref 96–112)
Creatinine, Ser: 6.64 mg/dL — ABNORMAL HIGH (ref 0.50–1.35)
GFR calc Af Amer: 9 mL/min — ABNORMAL LOW (ref >90)
GFR calc non Af Amer: 8 mL/min — ABNORMAL LOW (ref >90)
Glucose, Bld: 85 mg/dL (ref 70–99)
Potassium: 4.5 mEq/L (ref 3.7–5.3)
Sodium: 141 mEq/L (ref 137–147)

## 2013-09-24 LAB — CBC
HEMATOCRIT: 25.1 % — AB (ref 39.0–52.0)
Hemoglobin: 7.9 g/dL — ABNORMAL LOW (ref 13.0–17.0)
MCH: 29.8 pg (ref 26.0–34.0)
MCHC: 31.5 g/dL (ref 30.0–36.0)
MCV: 94.7 fL (ref 78.0–100.0)
Platelets: 171 10*3/uL (ref 150–400)
RBC: 2.65 MIL/uL — ABNORMAL LOW (ref 4.22–5.81)
RDW: 16.3 % — AB (ref 11.5–15.5)
WBC: 8.3 10*3/uL (ref 4.0–10.5)

## 2013-09-24 LAB — GLUCOSE, CAPILLARY: Glucose-Capillary: 109 mg/dL — ABNORMAL HIGH (ref 70–99)

## 2013-09-24 LAB — PROTIME-INR
INR: 1.58 — ABNORMAL HIGH (ref 0.00–1.49)
PROTHROMBIN TIME: 18.4 s — AB (ref 11.6–15.2)

## 2013-09-24 LAB — HEPARIN LEVEL (UNFRACTIONATED): Heparin Unfractionated: <0.1 IU/mL — ABNORMAL LOW (ref 0.30–0.70)

## 2013-09-24 MED ORDER — WARFARIN SODIUM 1 MG PO TABS: 1.5000 mg | ORAL_TABLET | Freq: Once | ORAL | Status: DC

## 2013-09-24 NOTE — Discharge Planning (Signed)
Pt stated he was ready to be DCd and his pain was under control.  Pt's IV removed and Pt given DC instructions with family in room.  Pt Told of FU visits and any lab work needing to be done.  Pt's JP left in per Dr. Kayleen Memos and [place] will be seeing pt at home for PT, and RN care (wound care, JP drain and labs needed.)  Pt educated about s/sx of future infections and when to call doctor or return to the hospital.  Pt will be wheeled to car by PCT and family once ready.

## 2013-09-24 NOTE — Progress Notes (Signed)
[name] [name]  MRN: [id number]  DOB/AGE: 1951/04/07 63 y.o.  Primary Care Physician:FUSCO,LAWRENCE J., MD  Admit date: 09/17/2013  Chief Complaint:  Chief Complaint  Patient presents with  . Fall    S-Pt presented on  09/17/2013 with  Chief Complaint  Patient presents with  . Fall  .    Pt offers no new complaints    Pt awaiting to go home.  Meds  . allopurinol  100 mg Oral Daily  . amiodarone  100 mg Oral Daily  . atorvastatin  20 mg Oral q1800  . Chlorhexidine Gluconate Cloth  6 each Topical Q0600  . diltiazem  300 mg Oral Daily  . [START ON 09/25/2013] epoetin (EPOGEN/PROCRIT) injection  14,000 Units Intravenous Q T,Th,Sa-HD  . insulin aspart  0-9 Units Subcutaneous TID WC  . metoprolol tartrate  25 mg Oral TID  . mupirocin ointment  1 application Nasal BID  . pantoprazole  40 mg Oral BID  . sevelamer carbonate  800 mg Oral TID WC  . sodium chloride  3 mL Intravenous Q12H  . traZODone  50 mg Oral QHS  . warfarin  1.5 mg Oral ONCE-1800  . Warfarin - Pharmacist Dosing Inpatient   Does not apply Q24H       Physical Exam: Vital signs in last 24 hours: Temp:  [98 F (36.7 C)-98.8 F (37.1 C)] 98.6 F (37 C) (01/28 0545) Pulse Rate:  [81-127] 102 (01/28 0545) Resp:  [16-20] 20 (01/28 0545) BP: (86-110)/(44-73) 97/65 mmHg (01/28 0545) SpO2:  [91 %-100 %] 95 % (01/28 0545) Weight:  [205 lb 0.4 oz (93 kg)-210 lb (95.255 kg)] 210 lb (95.255 kg) (01/28 0545) Weight change: -10 lb 2.8 oz (-4.614 kg) Last BM Date: 09/22/13  Intake/Output from previous day: 01/27 0701 - 01/28 0700 In: 240 [P.O.:240] Out: 1593      Physical Exam: General- pt is awake,alert, oriented to time place and person  Resp- No acute REsp distress, CTA B/L NO Rhonchi  CVS- S1S2 Irregular in rate and rhythm  GIT- BS+, soft, NT, ND  EXT- NO LE Edema, Cyanosis, Right BKA  Access- AVF+ Bruit    Lab Results: CBC  Recent Labs  09/23/13 0638 09/24/13 0651  WBC 8.8 8.3  HGB 8.0* 7.9*   HCT 24.5* 25.1*  PLT 173 171    BMET  Recent Labs  09/23/13 0638 09/24/13 0651  NA 139 141  K 5.2 4.5  CL 94* 97  CO2 26 30  GLUCOSE 107* 85  BUN 48* 21  CREATININE 10.89* 6.64*  CALCIUM 9.7 9.7    MICRO Recent Results (from the past 240 hour(s))  WOUND CULTURE     Status: None   Collection Time    09/17/13  9:00 AM      Result Value Range Status   Specimen Description WOUND LEG   Final   Special Requests NONE   Final   Gram Stain     Final   Value: RARE WBC PRESENT, PREDOMINANTLY PMN     NO SQUAMOUS EPITHELIAL CELLS SEEN     NO ORGANISMS SEEN     Performed at [facility]   Culture     Final   Value: FEW METHICILLIN RESISTANT STAPHYLOCOCCUS AUREUS     Note: RIFAMPIN AND GENTAMICIN SHOULD NOT BE USED AS SINGLE DRUGS FOR TREATMENT OF STAPH INFECTIONS. This organism is presumed to be Clindamycin resistant based on detection of inducible Clindamycin resistance. CRITICAL RESULT CALLED TO, READ BACK BY AND  VERIFIED WITH: N. ROWE 1/24 @1015  BY REAMM     FEW PROTEUS MIRABILIS     Performed at [facility]   Report Status 09/20/2013 FINAL   Final   Organism ID, Bacteria METHICILLIN RESISTANT STAPHYLOCOCCUS AUREUS   Final   Organism ID, Bacteria PROTEUS MIRABILIS   Final  MRSA PCR SCREENING     Status: Abnormal   Collection Time    09/18/13 12:10 AM      Result Value Range Status   MRSA by PCR POSITIVE (*) NEGATIVE Final   Comment:            The GeneXpert MRSA Assay (FDA     approved for NASAL specimens     only), is one component of a     comprehensive MRSA colonization     surveillance program. It is not     intended to diagnose MRSA     infection nor to guide or     monitor treatment for     MRSA infections.     RESULT CALLED TO, READ BACK BY AND VERIFIED WITH:     MARTIN C AT 0203 ON [id number] BY FORSYTH K      Lab Results  Component Value Date   CALCIUM 9.7 09/24/2013   PHOS 3.8 09/21/2013           Impression: 1)Renal  ESRD  on HD  ON TTS schedule  Pt was Dialyzedyesterday  No need of HD today  2)HTN  Target Organ damage  CKD  CAD  CHF  PVD  Medication-  On Calcium Channel Blockers  On Beta blockers  3)Anemia HGb not at goal (9--11)  On EPO  4)CKD Mineral-Bone Disorder  Phosphorus at goal.  Calcium at goal   5)PVD  Admitted with Wound dehiscence Surgery and Primary MD following   6)Electrolytes  Normokalemic  Normonatremic   7)Acid base  Co2 at goal        Plan:  Will continue current care     [place] S 09/24/2013, 9:01 AM

## 2013-09-24 NOTE — Progress Notes (Signed)
The patient was seen and examined. His chart, vitals signs, and laboratory studies were reviewed. Discharge summary was dictated by Dr. [name] yesterday. No new changes. The patient was discussed with surgeon Dr. Arnoldo Morale. Wound care has been set up with home health nursing. In addition, I have asked the home health nurse to obtain a CBC and PT/INR on 09/26/2013 and 09/29/2013. They were instructed to call the results to Dr. [name] office and/or the Coumadin clinic associated with [facility].

## 2013-09-24 NOTE — Discharge Instructions (Signed)
Bulb Drain Home Care °A bulb drain consists of a thin rubber tube and a soft, round bulb that creates a gentle suction. The rubber tube is placed in the area where you had surgery. A bulb is attached to the end of the tube that is outside the body. The bulb drain removes excess fluid that normally builds up in a surgical wound after surgery. The color and amount of fluid will vary. Immediately after surgery, the fluid is bright red and is a little thicker than water. It may gradually change to a yellow or pink color and become more thin and water-like. When the amount decreases to about 1 or 2 tbsp in 24 hours, your health care provider will usually remove it. °DAILY CARE °· Keep the bulb flat (compressed) at all times, except while emptying it. The flatness creates suction. You can flatten the bulb by squeezing it firmly in the middle and then closing the cap. °· Keep sites where the tube enters the skin dry and covered with a bandage (dressing). °· Secure the tube 1 2 in (2.5 5.1 cm) below the insertion sites, to keep it from pulling on your stitches. The tube is stitched in place and will not slip out. °· Secure the bulb as directed by your health care provider. °· For the first 3 days after surgery, there usually is more fluid in the bulb. Empty the bulb whenever it becomes half full because the bulb does not create enough suction if it is too full. The bulb could also overflow. Write down how much fluid you remove each time you empty your drain. Add up the amount removed in 24 hours. °· Empty the bulb at the same time every day once the amount of fluid decreases and you only need to empty it once a day. Write down the amounts and the 24-hour totals to give to your health care provider. This helps your health care provider know when the tubes can be removed. °EMPTYING THE BULB DRAIN °Before emptying the bulb, get a measuring cup, a piece of paper, and a pen and wash your hands. °· Gently run your fingers down  the tube (stripping) to empty any drainage from the tubing into the bulb. This may need to be done several times a day to clear the tubing of clots and tissue. °· Open the bulb cap to release suction, which causes it to inflate. Do not touch the inside of the cap. °· Gently run your fingers down the tube (stripping) to empty any drainage from the tubing into the bulb. °· Hold the cap out of the way, and pour fluid into the measuring cup.   °· Squeeze the bulb to provide suction.  °· Replace the cap.   °· Check the tape that holds the tube to your skin. If it is becoming loose, you can remove the loose piece of tape and apply a new one. Then, pin the bulb to your shirt.   °· Write down the amount of fluid you emptied out. Write down the date and each time you emptied your bulb drain. (If there are 2 bulbs, note the amount of drainage from each bulb and keep the totals separate. Your health care provider will want to know the total amounts for each drain and which tube is draining more.)   °· Flush the fluid down the toilet and wash your hands.   °· Call your health care provider once you have less than 2 tbsp of fluid collecting in the bulb drain every 24   hours. °If there is drainage around the tube site, change dressings and keep the area dry. Cleanse around tube with sterile saline and place dry gauze around site. This gauze should be changed when it is soiled. If it stays clean and unsoiled, it should still be changed daily.  °SEEK MEDICAL CARE IF: °· Your drainage has a bad smell or is cloudy.   °· You have a fever.   °· Your drainage is increasing instead of decreasing.   °· Your tube fell out.   °· You have redness or swelling around the tube site.   °· You have drainage from a surgical wound.   °· Your bulb drain will not stay flat after you empty it.   °MAKE SURE YOU:  °· Understand these instructions. °· Will watch your condition. °· Will get help right away if you are not doing well or get worse. °Document  Released: 08/11/2000 Document Revised: 06/04/2013 Document Reviewed: 01/17/2012 °Exit[employer]® Patient Information ©2014 [employer]. ° ° °

## 2013-09-24 NOTE — Progress Notes (Addendum)
Patient is cleared from surgery standpoint for discharge. Home health will be following the patient. Will see patient in my office in 2 weeks. Would continue vancomycin while on dialysis.

## 2013-09-24 NOTE — Progress Notes (Signed)
[facility] for Coumadin Indication: atrial fibrillation  Allergies  Allergen Reactions  . Bacitracin Other (See Comments)    unknown  . Norvasc [Amlodipine Besylate] Other (See Comments)    unknown   Patient Measurements: Height: 6\' 2"  (188 cm) (6'2") Weight: 210 lb (95.255 kg) IBW/kg (Calculated) : 82.2  Vital Signs: Temp: 98.6 F (37 C) (01/28 0545) Temp src: Oral (01/28 0545) BP: 97/65 mmHg (01/28 0545) Pulse Rate: 102 (01/28 0545)  Labs:  Recent Labs  09/22/13 0517 09/23/13 5732 09/24/13 0651  HGB 8.6* 8.0* 7.9*  HCT 26.3* 24.5* 25.1*  PLT 185 173 171  LABPROT 19.9* 21.5* 18.4*  INR 1.75* 1.93* 1.58*  HEPARINUNFRC 0.55 0.47 <0.10*  CREATININE  --  10.89* 6.64*   Estimated Creatinine Clearance: 13.1 ml/min (by C-G formula based on Cr of 6.64).  Medical History: Past Medical History  Diagnosis Date  . UGI bleed 02/12/11    on anticoagulation; EGD w/snare polypectomy-multiple polypoid lesions antrum(benign), Chronic active gastritis (NEGATIVE H pylori)  . Anemia, chronic disease   . Sepsis due to enterococcus 02/11/11  . Atrial fibrillation     coumadin  . CAD (coronary artery disease)     s/p CABGx4  . Cardiomyopathy     40-45% EF 6/12  . Diabetes mellitus   . Hypertension   . Hyperlipemia   . Gout   . S/P patent foramen ovale closure   . History of nuclear stress test 11/2011    negative myoview  . End stage kidney disease     T/TH/SAT dialyis, Dr Hinda Lenis  . Gout   . History of nuclear stress test 07/04/2010    dipyridamole; mod fixed inferolateral defect; non-diagnostic for ischemia; low risk scan   . Hyperbilirubinemia 08/10/2013   Medications:  Scheduled:  . allopurinol  100 mg Oral Daily  . amiodarone  100 mg Oral Daily  . atorvastatin  20 mg Oral q1800  . Chlorhexidine Gluconate Cloth  6 each Topical Q0600  . diltiazem  300 mg Oral Daily  . [START ON 09/25/2013] epoetin (EPOGEN/PROCRIT) injection   14,000 Units Intravenous Q T,Th,Sa-HD  . insulin aspart  0-9 Units Subcutaneous TID WC  . metoprolol tartrate  25 mg Oral TID  . mupirocin ointment  1 application Nasal BID  . pantoprazole  40 mg Oral BID  . sevelamer carbonate  800 mg Oral TID WC  . sodium chloride  3 mL Intravenous Q12H  . traZODone  50 mg Oral QHS  . Warfarin - Pharmacist Dosing Inpatient   Does not apply Q24H   Assessment: 63 yo M on chronic warfarin for Afib.  He was admitted s/p fall with surgical intervention 09/19/2013.  INR was therapeutic on admission.  No active bleeding.  CBC consistent with anemia of chronic disease, post-op anemia.   Coumadin resumed 1/27.  INR returning to goal.    Goal of Therapy:  INR 2-3   Plan:  Coumadin 1.5mg  PO today x 1 INR daily Agree with plans to discharge home on previous home regimen  Biagio Borg 09/24/2013,8:22 AM

## 2013-09-29 ENCOUNTER — Telehealth: Payer: Self-pay | Admitting: Internal Medicine

## 2013-09-29 NOTE — Telephone Encounter (Signed)
She wants to know if you received his INR that she faxed on Friday?She was suppose to have drawn a CBC and INR, today. She drew CBC,but could not get enough blood for INR. Can she draw  CBC on Wednesday?

## 2013-09-29 NOTE — Telephone Encounter (Signed)
Patient will have INR drawn on Wednesday 2/4  Most recent labs from 1/30 will be faxed to our office per [facility].

## 2013-10-01 ENCOUNTER — Telehealth: Payer: Self-pay | Admitting: Pharmacist Clinician (PhC)/ Clinical Pharmacy Specialist

## 2013-10-01 LAB — PROTIME-INR

## 2013-10-01 NOTE — Telephone Encounter (Signed)
Spoke with patient, has not had INR's drawn since [facility] closed [place] office, was followed by Dr. Rollene Fare.  Lab shows low INR from last week, but RN re-drew today, will wait for that lab to make adjustments.  Per patient taking 1mg  daily.

## 2013-10-01 NOTE — Telephone Encounter (Signed)
Message copied by Rockne Menghini on Wed Oct 01, 2013  2:47 PM ------      Message from: Pixie Casino      Created: Wed Oct 01, 2013 11:25 AM       INR low at 1.4 - don't know this patient. May have been a former [place] patient.  Labs drawn by home health.            -Mali ------

## 2013-10-13 ENCOUNTER — Encounter (HOSPITAL_COMMUNITY): Payer: Self-pay | Admitting: Emergency Medicine

## 2013-10-13 ENCOUNTER — Inpatient Hospital Stay (HOSPITAL_COMMUNITY)
Admission: EM | Admit: 2013-10-13 | Discharge: 2013-10-20 | DRG: 474 | Disposition: A | Discharge status: Home Health | Payer: Medicare Other | Attending: Family Medicine | Admitting: Family Medicine

## 2013-10-13 DIAGNOSIS — I120 Hypertensive chronic kidney disease with stage 5 chronic kidney disease or end stage renal disease: Secondary | ICD-10-CM | POA: Diagnosis present

## 2013-10-13 DIAGNOSIS — T8789 Other complications of amputation stump: Principal | ICD-10-CM | POA: Diagnosis present

## 2013-10-13 DIAGNOSIS — Y835 Amputation of limb(s) as the cause of abnormal reaction of the patient, or of later complication, without mention of misadventure at the time of the procedure: Secondary | ICD-10-CM | POA: Diagnosis present

## 2013-10-13 DIAGNOSIS — I96 Gangrene, not elsewhere classified: Secondary | ICD-10-CM | POA: Diagnosis present

## 2013-10-13 DIAGNOSIS — Z7901 Long term (current) use of anticoagulants: Secondary | ICD-10-CM

## 2013-10-13 DIAGNOSIS — T8743 Infection of amputation stump, right lower extremity: Secondary | ICD-10-CM

## 2013-10-13 DIAGNOSIS — N186 End stage renal disease: Secondary | ICD-10-CM

## 2013-10-13 DIAGNOSIS — Z87891 Personal history of nicotine dependence: Secondary | ICD-10-CM

## 2013-10-13 DIAGNOSIS — T8740 Infection of amputation stump, unspecified extremity: Secondary | ICD-10-CM

## 2013-10-13 DIAGNOSIS — Z833 Family history of diabetes mellitus: Secondary | ICD-10-CM

## 2013-10-13 DIAGNOSIS — I4891 Unspecified atrial fibrillation: Secondary | ICD-10-CM | POA: Diagnosis present

## 2013-10-13 DIAGNOSIS — D638 Anemia in other chronic diseases classified elsewhere: Secondary | ICD-10-CM | POA: Diagnosis present

## 2013-10-13 DIAGNOSIS — M109 Gout, unspecified: Secondary | ICD-10-CM | POA: Diagnosis present

## 2013-10-13 DIAGNOSIS — E876 Hypokalemia: Secondary | ICD-10-CM | POA: Diagnosis present

## 2013-10-13 DIAGNOSIS — I2510 Atherosclerotic heart disease of native coronary artery without angina pectoris: Secondary | ICD-10-CM | POA: Diagnosis present

## 2013-10-13 DIAGNOSIS — E1122 Type 2 diabetes mellitus with diabetic chronic kidney disease: Secondary | ICD-10-CM

## 2013-10-13 DIAGNOSIS — Z992 Dependence on renal dialysis: Secondary | ICD-10-CM

## 2013-10-13 DIAGNOSIS — I2589 Other forms of chronic ischemic heart disease: Secondary | ICD-10-CM | POA: Diagnosis present

## 2013-10-13 DIAGNOSIS — E1059 Type 1 diabetes mellitus with other circulatory complications: Secondary | ICD-10-CM | POA: Diagnosis present

## 2013-10-13 DIAGNOSIS — T8130XA Disruption of wound, unspecified, initial encounter: Secondary | ICD-10-CM

## 2013-10-13 DIAGNOSIS — E785 Hyperlipidemia, unspecified: Secondary | ICD-10-CM | POA: Diagnosis present

## 2013-10-13 DIAGNOSIS — I798 Other disorders of arteries, arterioles and capillaries in diseases classified elsewhere: Secondary | ICD-10-CM | POA: Diagnosis present

## 2013-10-13 DIAGNOSIS — S88119A Complete traumatic amputation at level between knee and ankle, unspecified lower leg, initial encounter: Secondary | ICD-10-CM

## 2013-10-13 DIAGNOSIS — Z951 Presence of aortocoronary bypass graft: Secondary | ICD-10-CM

## 2013-10-13 DIAGNOSIS — L089 Local infection of the skin and subcutaneous tissue, unspecified: Secondary | ICD-10-CM | POA: Diagnosis present

## 2013-10-13 DIAGNOSIS — T148XXA Other injury of unspecified body region, initial encounter: Secondary | ICD-10-CM

## 2013-10-13 DIAGNOSIS — Z954 Presence of other heart-valve replacement: Secondary | ICD-10-CM

## 2013-10-13 LAB — CBC WITH DIFFERENTIAL/PLATELET
Basophils Absolute: 0 10*3/uL (ref 0.0–0.1)
Basophils Relative: 0 % (ref 0–1)
EOS PCT: 5 % (ref 0–5)
Eosinophils Absolute: 0.5 10*3/uL (ref 0.0–0.7)
HCT: 31.5 % — ABNORMAL LOW (ref 39.0–52.0)
Hemoglobin: 9.7 g/dL — ABNORMAL LOW (ref 13.0–17.0)
LYMPHS ABS: 1.1 10*3/uL (ref 0.7–4.0)
Lymphocytes Relative: 10 % — ABNORMAL LOW (ref 12–46)
MCH: 28.6 pg (ref 26.0–34.0)
MCHC: 30.8 g/dL (ref 30.0–36.0)
MCV: 92.9 fL (ref 78.0–100.0)
Monocytes Absolute: 0.7 10*3/uL (ref 0.1–1.0)
Monocytes Relative: 7 % (ref 3–12)
Neutro Abs: 8.6 10*3/uL — ABNORMAL HIGH (ref 1.7–7.7)
Neutrophils Relative %: 78 % — ABNORMAL HIGH (ref 43–77)
PLATELETS: 314 10*3/uL (ref 150–400)
RBC: 3.39 MIL/uL — ABNORMAL LOW (ref 4.22–5.81)
RDW: 17 % — ABNORMAL HIGH (ref 11.5–15.5)
WBC: 11 10*3/uL — AB (ref 4.0–10.5)

## 2013-10-13 LAB — BASIC METABOLIC PANEL
BUN: 33 mg/dL — ABNORMAL HIGH (ref 6–23)
CALCIUM: 10.2 mg/dL (ref 8.4–10.5)
CO2: 28 meq/L (ref 19–32)
Chloride: 100 mEq/L (ref 96–112)
Creatinine, Ser: 7.99 mg/dL — ABNORMAL HIGH (ref 0.50–1.35)
GFR calc Af Amer: 7 mL/min — ABNORMAL LOW (ref >90)
GFR calc non Af Amer: 6 mL/min — ABNORMAL LOW (ref >90)
Glucose, Bld: 82 mg/dL (ref 70–99)
POTASSIUM: 4.1 meq/L (ref 3.7–5.3)
SODIUM: 146 meq/L (ref 137–147)

## 2013-10-13 LAB — GLUCOSE, CAPILLARY: Glucose-Capillary: 83 mg/dL (ref 70–99)

## 2013-10-13 MED ORDER — TRAZODONE HCL 50 MG PO TABS
50.0000 mg | ORAL_TABLET | Freq: Every day | ORAL | Status: DC
  Administered 2013-10-13 – 2013-10-19 (×7): 50 mg via ORAL
  Filled 2013-10-13 (×7): qty 1

## 2013-10-13 MED ORDER — PIPERACILLIN-TAZOBACTAM IN DEX 2-0.25 GM/50ML IV SOLN
INTRAVENOUS | Status: AC
  Filled 2013-10-13: qty 50

## 2013-10-13 MED ORDER — ASPIRIN 81 MG PO CHEW
81.0000 mg | CHEWABLE_TABLET | Freq: Every day | ORAL | Status: DC
  Administered 2013-10-14 – 2013-10-20 (×6): 81 mg via ORAL
  Filled 2013-10-13 (×6): qty 1

## 2013-10-13 MED ORDER — ENOXAPARIN SODIUM 30 MG/0.3ML ~~LOC~~ SOLN
30.0000 mg | SUBCUTANEOUS | Status: DC
  Administered 2013-10-13 – 2013-10-16 (×4): 30 mg via SUBCUTANEOUS
  Filled 2013-10-13 (×4): qty 0.3

## 2013-10-13 MED ORDER — PANTOPRAZOLE SODIUM 40 MG PO TBEC
40.0000 mg | DELAYED_RELEASE_TABLET | Freq: Two times a day (BID) | ORAL | Status: DC
  Administered 2013-10-13 – 2013-10-20 (×13): 40 mg via ORAL
  Filled 2013-10-13 (×13): qty 1

## 2013-10-13 MED ORDER — SEVELAMER CARBONATE 800 MG PO TABS
800.0000 mg | ORAL_TABLET | Freq: Three times a day (TID) | ORAL | Status: DC
  Administered 2013-10-14 – 2013-10-20 (×18): 800 mg via ORAL
  Filled 2013-10-13 (×17): qty 1

## 2013-10-13 MED ORDER — METOPROLOL TARTRATE 25 MG PO TABS
25.0000 mg | ORAL_TABLET | Freq: Three times a day (TID) | ORAL | Status: DC
  Administered 2013-10-13 – 2013-10-20 (×20): 25 mg via ORAL
  Filled 2013-10-13 (×20): qty 1

## 2013-10-13 MED ORDER — AMIODARONE HCL 200 MG PO TABS
100.0000 mg | ORAL_TABLET | Freq: Every day | ORAL | Status: DC
  Administered 2013-10-14 – 2013-10-20 (×6): 100 mg via ORAL
  Filled 2013-10-13 (×6): qty 1

## 2013-10-13 MED ORDER — PANTOPRAZOLE SODIUM 40 MG PO TBEC: 40.0000 mg | DELAYED_RELEASE_TABLET | Freq: Every day | ORAL | Status: DC

## 2013-10-13 MED ORDER — ALLOPURINOL 100 MG PO TABS
100.0000 mg | ORAL_TABLET | Freq: Every day | ORAL | Status: DC
  Administered 2013-10-13 – 2013-10-20 (×7): 100 mg via ORAL
  Filled 2013-10-13 (×7): qty 1

## 2013-10-13 MED ORDER — ACETAMINOPHEN 325 MG PO TABS
650.0000 mg | ORAL_TABLET | Freq: Four times a day (QID) | ORAL | Status: DC | PRN
  Administered 2013-10-13 – 2013-10-20 (×6): 650 mg via ORAL
  Filled 2013-10-13 (×6): qty 2

## 2013-10-13 MED ORDER — ACETAMINOPHEN 650 MG RE SUPP: 650.0000 mg | Freq: Four times a day (QID) | RECTAL | Status: DC | PRN

## 2013-10-13 MED ORDER — PIPERACILLIN-TAZOBACTAM IN DEX 2-0.25 GM/50ML IV SOLN
2.2500 g | Freq: Three times a day (TID) | INTRAVENOUS | Status: DC
  Administered 2013-10-13 – 2013-10-19 (×16): 2.25 g via INTRAVENOUS
  Filled 2013-10-13 (×22): qty 50

## 2013-10-13 MED ORDER — INSULIN GLARGINE 100 UNIT/ML ~~LOC~~ SOLN
10.0000 [IU] | Freq: Every day | SUBCUTANEOUS | Status: DC
Start: 2013-10-13 — End: 2013-10-20
  Administered 2013-10-13 – 2013-10-18 (×6): 10 [IU] via SUBCUTANEOUS
  Filled 2013-10-13 (×8): qty 0.1

## 2013-10-13 MED ORDER — TRAMADOL HCL 50 MG PO TABS
50.0000 mg | ORAL_TABLET | Freq: Three times a day (TID) | ORAL | Status: DC | PRN
  Administered 2013-10-14 – 2013-10-15 (×2): 50 mg via ORAL
  Filled 2013-10-13 (×2): qty 1

## 2013-10-13 MED ORDER — SODIUM CHLORIDE 0.9 % IJ SOLN
3.0000 mL | INTRAMUSCULAR | Status: DC | PRN
Start: 2013-10-13 — End: 2013-10-20

## 2013-10-13 MED ORDER — SODIUM CHLORIDE 0.9 % IJ SOLN
3.0000 mL | Freq: Two times a day (BID) | INTRAMUSCULAR | Status: DC
  Administered 2013-10-13 – 2013-10-20 (×10): 3 mL via INTRAVENOUS

## 2013-10-13 MED ORDER — VANCOMYCIN HCL 10 G IV SOLR
1500.0000 mg | Freq: Once | INTRAVENOUS | Status: AC
  Administered 2013-10-13: 1500 mg via INTRAVENOUS
  Filled 2013-10-13: qty 1500

## 2013-10-13 MED ORDER — ATORVASTATIN CALCIUM 20 MG PO TABS
20.0000 mg | ORAL_TABLET | Freq: Every day | ORAL | Status: DC
  Administered 2013-10-14 – 2013-10-19 (×6): 20 mg via ORAL
  Filled 2013-10-13 (×6): qty 1

## 2013-10-13 MED ORDER — DILTIAZEM HCL ER COATED BEADS 180 MG PO CP24
180.0000 mg | ORAL_CAPSULE | Freq: Every day | ORAL | Status: DC
  Administered 2013-10-14 – 2013-10-20 (×6): 180 mg via ORAL
  Filled 2013-10-13 (×6): qty 1

## 2013-10-13 MED ORDER — ONDANSETRON HCL 4 MG PO TABS: 4.0000 mg | ORAL_TABLET | Freq: Four times a day (QID) | ORAL | Status: DC | PRN

## 2013-10-13 MED ORDER — ONDANSETRON HCL 4 MG/2ML IJ SOLN: 4.0000 mg | Freq: Four times a day (QID) | INTRAMUSCULAR | Status: DC | PRN

## 2013-10-13 MED ORDER — SODIUM CHLORIDE 0.9 % IV SOLN: 250.0000 mL | INTRAVENOUS | Status: DC | PRN

## 2013-10-13 MED ORDER — FOLIC ACID 1 MG PO TABS
1.0000 mg | ORAL_TABLET | Freq: Every day | ORAL | Status: DC
  Administered 2013-10-14 – 2013-10-20 (×6): 1 mg via ORAL
  Filled 2013-10-13 (×6): qty 1

## 2013-10-13 NOTE — ED Notes (Signed)
Patient brought here via EMS. Alert and oriented. Airway patent. Patient reports having right lower leg amputated 3 weeks ago. Per patient nurse came to house today to check incision site on right leg, when nurse notice incision site was open and draining. Nurse consulted with Dr Arnoldo Morale and patient was told to come to ER. Patient denies any fevers. Active drainage with serosanguinous drainage noted.

## 2013-10-13 NOTE — ED Provider Notes (Signed)
CSN: [id number]     Arrival date & time 10/13/13  1330 History   First MD Initiated Contact with Patient 10/13/13 1534     Chief Complaint  Patient presents with  . Drainage from Incision     HPI Pt was seen at 1540. Per EMS and pt report, c/o gradual onset and persistence on constant right BKA surgical wound "opening" for an unknown period of time. Pt states he "isn't sure" when the wound opened. Home Health staff noted foul smelling drainage and sent pt to the ED for further evaluation today. Pt states the wound dressing was changed "last week." Denies fevers, no falling, no pain at amputation site.    Past Medical History  Diagnosis Date  . UGI bleed 02/12/11    on anticoagulation; EGD w/snare polypectomy-multiple polypoid lesions antrum(benign), Chronic active gastritis (NEGATIVE H pylori)  . Anemia, chronic disease   . Sepsis due to enterococcus 02/11/11  . Atrial fibrillation     coumadin  . CAD (coronary artery disease)     s/p CABGx4  . Cardiomyopathy     40-45% EF 6/12  . Diabetes mellitus   . Hypertension   . Hyperlipemia   . Gout   . S/P patent foramen ovale closure   . History of nuclear stress test 11/2011    negative myoview  . End stage kidney disease     T/TH/SAT dialyis, Dr Hinda Lenis  . Gout   . History of nuclear stress test 07/04/2010    dipyridamole; mod fixed inferolateral defect; non-diagnostic for ischemia; low risk scan   . Hyperbilirubinemia 08/10/2013   Past Surgical History  Procedure Laterality Date  . Cardiac catheterization  12/31/2006    severe diffuse 3 vessel CAD, severe 4+ MR (Dr. Jackie Plum)  . Mitral valve replacement  01/05/2007    bioprosthetic 34mm Edwards precordial tissue (Dr. Servando Snare)  . Coronary artery bypass graft  01/05/2007    x4; LIMA-LAD, SVG-OM, seq. SVG-PDA, PLA-RCA  . Colonoscopy  06/23/2011    Dr. Oneida Alar: multiple sessile and pedunculated polyps, moderate diverticulosis, internal hemorrhoids, +ADENOMATOUS POLYPS, consider  genetic testing, surveillance in October 2015   . Esophagogastroduodenoscopy  02/12/2011    CHRONIC ACTIVE GASTRITIS, NO H. pylori  . Transthoracic echocardiogram  11/2011    EF 25% w/ mod dilatation of LV & mod LVH; LA severely dilated; mod-severe dilation of RV with mod-severe decrease in RV function; mild MR & TR  . Cardioversion  04/19/2007    Dr. Marella Chimes  . Transthoracic echocardiogram  02/14/2011    EF 40-45%, mod conc LVH; ventricular septum with motion showing abnormal function, dyssynergy, paradox; mild AS; mild-mod MR stenosis; LA severely dilated; aptrial septum bowed from L to R with increased LA pressure   . Amputation Right 07/14/2013    Procedure: AMPUTATION DIGIT;  Surgeon: [name], MD;  Location: [facility];  Service: General;  Laterality: Right;  . Amputation Right 08/08/2013    Procedure: AMPUTATION BELOW KNEE;  Surgeon: [name], MD;  Location: [facility];  Service: General;  Laterality: Right;  . Stump revision Right 09/19/2013    Procedure: STUMP REVISION;  Surgeon: Scherry Ran, MD;  Location: [facility];  Service: General;  Laterality: Right;   Family History  Problem Relation Age of Onset  . Diabetes Mother   . Diabetes Father   . Diabetes Sister   . Colon cancer Neg Hx    History  Substance Use Topics  . Smoking status: Former Smoker --  1.00 packs/day for 8 years    Types: Cigarettes    Quit date: 11/27/2006  . Smokeless tobacco: Never Used     Comment: quit about 5 yrs  . Alcohol Use: No    Review of Systems ROS: Statement: All systems negative except as marked or noted in the HPI; Constitutional: Negative for fever and chills. ; ; Eyes: Negative for eye pain, redness and discharge. ; ; ENMT: Negative for ear pain, hoarseness, nasal congestion, sinus pressure and sore throat. ; ; Cardiovascular: Negative for chest pain, palpitations, diaphoresis, dyspnea and peripheral edema. ; ; Respiratory: Negative for cough, wheezing and stridor. ; ;  Gastrointestinal: Negative for nausea, vomiting, diarrhea, abdominal pain, blood in stool, hematemesis, jaundice and rectal bleeding. . ; ; Genitourinary: Negative for dysuria, flank pain and hematuria. ; ; Musculoskeletal: Negative for back pain and neck pain. Negative for swelling and trauma.; ; Skin: Negative for pruritus, rash, abrasions, blisters, bruising and +wound drainage.; ; Neuro: Negative for headache, lightheadedness and neck stiffness. Negative for weakness, altered level of consciousness , altered mental status, extremity weakness, paresthesias, involuntary movement, seizure and syncope.      Allergies  Bacitracin and Norvasc  Home Medications   Current Outpatient Rx  Name  Route  Sig  Dispense  Refill  . allopurinol (ZYLOPRIM) 100 MG tablet   Oral   Take 100 mg by mouth daily.          Marland Kitchen amiodarone (PACERONE) 100 MG tablet   Oral   Take 100 mg by mouth daily.         Marland Kitchen aspirin 81 MG tablet   Oral   Take 81 mg by mouth daily.          Marland Kitchen atorvastatin (LIPITOR) 20 MG tablet   Oral   Take 20 mg by mouth daily.          . B Complex-C-Zn-Folic Acid (DIALYVITE/ZINC PO)   Oral   Take by mouth 3 (three) times a week. Patient has dialysis on Tuesdays, Thursdays, and Saturdays         . diltiazem (CARDIZEM CD) 180 MG 24 hr capsule   Oral   Take 1 capsule (180 mg total) by mouth daily.   30 capsule   1   . folic acid (FOLVITE) 1 MG tablet   Oral   Take 1 mg by mouth daily.           Marland Kitchen glimepiride (AMARYL) 4 MG tablet   Oral   Take 4 mg by mouth 2 (two) times daily.           . insulin glargine (LANTUS) 100 UNIT/ML injection   Subcutaneous   Inject 10 Units into the skin at bedtime.         . metoprolol tartrate (LOPRESSOR) 25 MG tablet   Oral   Take 1 tablet (25 mg total) by mouth 3 (three) times daily.         Marland Kitchen omeprazole (PRILOSEC) 20 MG capsule   Oral   Take 20 mg by mouth daily.         . pantoprazole (PROTONIX) 40 MG tablet    Oral   Take 40 mg by mouth 2 (two) times daily.          Marland Kitchen RENVELA 800 MG tablet   Oral   Take 800 mg by mouth 3 (three) times daily with meals.          . traMADol (ULTRAM) 50 MG tablet  Oral   Take by mouth every 8 (eight) hours as needed for moderate pain.         . traZODone (DESYREL) 50 MG tablet   Oral   Take 1 tablet (50 mg total) by mouth at bedtime.   30 tablet   0   . warfarin (COUMADIN) 1 MG tablet   Oral   Take 1-1.5 mg by mouth daily at 6 PM. Takes 1 tablet (1 mg) Monday thru Friday and takes 1.5 tablets (1.5 mg) on Saturday and Sunday          BP 141/87  Pulse 95  Temp(Src) 97.5 F (36.4 C) (Oral)  Resp 18  Ht 6\' 2"  (1.88 m)  Wt 210 lb (95.255 kg)  BMI 26.95 kg/m2  SpO2 94% Physical Exam 1545: Physical examination:  Nursing notes reviewed; Vital signs and O2 SAT reviewed;  Constitutional: Well developed, Well nourished, Well hydrated, In no acute distress; Head:  Normocephalic, atraumatic; Eyes: EOMI, PERRL, No scleral icterus; ENMT: Mouth and pharynx normal, Mucous membranes moist; Neck: Supple, Full range of motion, No lymphadenopathy; Cardiovascular: Regular rate and rhythm, No murmur, rub, or gallop; Respiratory: Breath sounds clear & equal bilaterally, No rales, rhonchi, wheezes.  Speaking full sentences with ease, Normal respiratory effort/excursion; Chest: Nontender, Movement normal; Abdomen: Soft, Nontender, Nondistended, Normal bowel sounds; Genitourinary: No CVA tenderness; Extremities: Pulses normal, No tenderness, +right BKA with open surgical wound loosely held together with sutures, foul smelling serosanguinous drainage, no bleeding. Multiple chronic appearing wounds on residual stump. No left calf edema.; Neuro: AA&Ox3, vague historian. Major CN grossly intact.  Speech clear. No gross focal motor or sensory deficits in extremities.; Skin: Color normal, Warm, Dry.   ED Course  Procedures   EKG Interpretation   None       MDM   MDM Reviewed: previous chart, nursing note and vitals Reviewed previous: labs Interpretation: labs    Results for orders placed during the hospital encounter of 10/13/13  GLUCOSE, CAPILLARY      Result Value Ref Range   Glucose-Capillary 83  70 - 99 mg/dL   Comment 1 Notify RN    CBC WITH DIFFERENTIAL      Result Value Ref Range   WBC 11.0 (*) 4.0 - 10.5 K/uL   RBC 3.39 (*) 4.22 - 5.81 MIL/uL   Hemoglobin 9.7 (*) 13.0 - 17.0 g/dL   HCT 31.5 (*) 39.0 - 52.0 %   MCV 92.9  78.0 - 100.0 fL   MCH 28.6  26.0 - 34.0 pg   MCHC 30.8  30.0 - 36.0 g/dL   RDW 17.0 (*) 11.5 - 15.5 %   Platelets 314  150 - 400 K/uL   Neutrophils Relative % 78 (*) 43 - 77 %   Neutro Abs 8.6 (*) 1.7 - 7.7 K/uL   Lymphocytes Relative 10 (*) 12 - 46 %   Lymphs Abs 1.1  0.7 - 4.0 K/uL   Monocytes Relative 7  3 - 12 %   Monocytes Absolute 0.7  0.1 - 1.0 K/uL   Eosinophils Relative 5  0 - 5 %   Eosinophils Absolute 0.5  0.0 - 0.7 K/uL   Basophils Relative 0  0 - 1 %   Basophils Absolute 0.0  0.0 - 0.1 K/uL  BASIC METABOLIC PANEL      Result Value Ref Range   Sodium 146  137 - 147 mEq/L   Potassium 4.1  3.7 - 5.3 mEq/L   Chloride 100  96 -  112 mEq/L   CO2 28  19 - 32 mEq/L   Glucose, Bld 82  70 - 99 mg/dL   BUN 33 (*) 6 - 23 mg/dL   Creatinine, Ser 7.99 (*) 0.50 - 1.35 mg/dL   Calcium 10.2  8.4 - 10.5 mg/dL   GFR calc non Af Amer 6 (*) >90 mL/min   GFR calc Af Amer 7 (*) >90 mL/min     1600:  T/C to General Surgery Dr. Arnoldo Morale, case discussed, including:  HPI, pertinent PM/SHx, VS/PE, dx testing, ED course and treatment:  Knows pt well, will likely need AKA, admit to medicine service, he will consult.    1735: IV vanomycin given. Dx and testing d/w pt and family.  Questions answered.  Verb understanding, agreeable to admit.  T/C to [facility] Dr. Roderic Palau, case discussed, including:  HPI, pertinent PM/SHx, VS/PE, dx testing, ED course and treatment:  Agreeable to admit, requests to write temporary orders,  obtain medical bed to team 2.       Alfonzo Feller, DO 10/14/13 [id number]

## 2013-10-13 NOTE — H&P (Signed)
[facility] History and Physical  [name] [name] MWU:[id number] DOB: [birth year] DOA: 10/13/2013  Referring physician: Dr. Thurnell Garbe PCP: [name], MD   Chief Complaint: infected wound  HPI: [name] [name] is a 63 y.o. male with history of diabetes, end-stage renal disease on hemodialysis and recent below the knee amputation. Patient was seen in followup by surgery last week for a wound check. He reports at that time his wound was doing okay. He has not had any recent fevers. He reports that home health nurse had come to check his wound today and noted serosanguineous discharge, foul odor or signs of infection. He does not have any pain around his wound, and feels that it may be swollen. He's not had any vomiting, diarrhea, cough, shortness of breath, chest pain or any other complaints. Since his wound appeared to be infected, he was sent to the ER for evaluation. The patient will be admitted for further management of his wound.   Review of Systems:  Pertinent positives as per HPI, otherwise negative  Past Medical History  Diagnosis Date  . UGI bleed 02/12/11    on anticoagulation; EGD w/snare polypectomy-multiple polypoid lesions antrum(benign), Chronic active gastritis (NEGATIVE H pylori)  . Anemia, chronic disease   . Sepsis due to enterococcus 02/11/11  . Atrial fibrillation     coumadin  . CAD (coronary artery disease)     s/p CABGx4  . Cardiomyopathy     40-45% EF 6/12  . Diabetes mellitus   . Hypertension   . Hyperlipemia   . Gout   . S/P patent foramen ovale closure   . History of nuclear stress test 11/2011    negative myoview  . End stage kidney disease     T/TH/SAT dialyis, Dr Hinda Lenis  . Gout   . History of nuclear stress test 07/04/2010    dipyridamole; mod fixed inferolateral defect; non-diagnostic for ischemia; low risk scan   . Hyperbilirubinemia 08/10/2013   Past Surgical History  Procedure Laterality Date  . Cardiac catheterization  12/31/2006     severe diffuse 3 vessel CAD, severe 4+ MR (Dr. Jackie Plum)  . Mitral valve replacement  01/05/2007    bioprosthetic 91mm Edwards precordial tissue (Dr. Servando Snare)  . Coronary artery bypass graft  01/05/2007    x4; LIMA-LAD, SVG-OM, seq. SVG-PDA, PLA-RCA  . Colonoscopy  06/23/2011    Dr. Oneida Alar: multiple sessile and pedunculated polyps, moderate diverticulosis, internal hemorrhoids, +ADENOMATOUS POLYPS, consider genetic testing, surveillance in October 2015   . Esophagogastroduodenoscopy  02/12/2011    CHRONIC ACTIVE GASTRITIS, NO H. pylori  . Transthoracic echocardiogram  11/2011    EF 25% w/ mod dilatation of LV & mod LVH; LA severely dilated; mod-severe dilation of RV with mod-severe decrease in RV function; mild MR & TR  . Cardioversion  04/19/2007    Dr. Marella Chimes  . Transthoracic echocardiogram  02/14/2011    EF 40-45%, mod conc LVH; ventricular septum with motion showing abnormal function, dyssynergy, paradox; mild AS; mild-mod MR stenosis; LA severely dilated; aptrial septum bowed from L to R with increased LA pressure   . Amputation Right 07/14/2013    Procedure: AMPUTATION DIGIT;  Surgeon: [name], MD;  Location: [facility];  Service: General;  Laterality: Right;  . Amputation Right 08/08/2013    Procedure: AMPUTATION BELOW KNEE;  Surgeon: [name], MD;  Location: [facility];  Service: General;  Laterality: Right;  . Stump revision Right 09/19/2013    Procedure: STUMP REVISION;  Surgeon:  Scherry Ran, MD;  Location: [facility];  Service: General;  Laterality: Right;   Social History:  reports that he quit smoking about 6 years ago. His smoking use included Cigarettes. He has a 8 pack-year smoking history. He has never used smokeless tobacco. He reports that he does not drink alcohol or use illicit drugs.  Allergies  Allergen Reactions  . Bacitracin Other (See Comments)    unknown  . Norvasc [Amlodipine Besylate] Other (See Comments)    unknown    Family History  Problem  Relation Age of Onset  . Diabetes Mother   . Diabetes Father   . Diabetes Sister   . Colon cancer Neg Hx      Prior to Admission medications   Medication Sig Start Date End Date Taking? Authorizing Provider  allopurinol (ZYLOPRIM) 100 MG tablet Take 100 mg by mouth daily.  05/15/11  Yes Historical Provider, MD  amiodarone (PACERONE) 100 MG tablet Take 100 mg by mouth daily.   Yes Historical Provider, MD  aspirin 81 MG tablet Take 81 mg by mouth daily.    Yes Historical Provider, MD  atorvastatin (LIPITOR) 20 MG tablet Take 20 mg by mouth daily.  02/07/13  Yes Rebecca Eaton, MD  B Complex-C-Zn-Folic Acid (DIALYVITE/ZINC PO) Take by mouth 3 (three) times a week. Patient has dialysis on Tuesdays, Thursdays, and Saturdays   Yes Historical Provider, MD  diltiazem (CARDIZEM CD) 180 MG 24 hr capsule Take 1 capsule (180 mg total) by mouth daily. 07/18/13  Yes Kathie Dike, MD  folic acid (FOLVITE) 1 MG tablet Take 1 mg by mouth daily.     Yes Historical Provider, MD  glimepiride (AMARYL) 4 MG tablet Take 4 mg by mouth 2 (two) times daily.     Yes Historical Provider, MD  insulin glargine (LANTUS) 100 UNIT/ML injection Inject 10 Units into the skin at bedtime.   Yes Historical Provider, MD  metoprolol tartrate (LOPRESSOR) 25 MG tablet Take 1 tablet (25 mg total) by mouth 3 (three) times daily. 08/13/13  Yes Nita Sells, MD  omeprazole (PRILOSEC) 20 MG capsule Take 20 mg by mouth daily.   Yes Historical Provider, MD  pantoprazole (PROTONIX) 40 MG tablet Take 40 mg by mouth 2 (two) times daily.  05/25/11  Yes Historical Provider, MD  RENVELA 800 MG tablet Take 800 mg by mouth 3 (three) times daily with meals.  05/03/11  Yes Historical Provider, MD  traMADol (ULTRAM) 50 MG tablet Take by mouth every 8 (eight) hours as needed for moderate pain.   Yes Historical Provider, MD  traZODone (DESYREL) 50 MG tablet Take 1 tablet (50 mg total) by mouth at bedtime. 08/13/13  Yes Nita Sells, MD   warfarin (COUMADIN) 1 MG tablet Take 1-1.5 mg by mouth daily at 6 PM. Takes 1 tablet (1 mg) Monday thru Friday and takes 1.5 tablets (1.5 mg) on Saturday and Sunday   Yes Historical Provider, MD   Physical Exam: Filed Vitals:   10/13/13 1658  BP: 141/87  Pulse: 95  Temp: 97.5 F (36.4 C)  Resp: 18    BP 141/87  Pulse 95  Temp(Src) 97.5 F (36.4 C) (Oral)  Resp 18  Ht 6\' 2"  (1.88 m)  Wt 95.255 kg (210 lb)  BMI 26.95 kg/m2  SpO2 94%  General:  Appears calm and comfortable Eyes: PERRL, normal lids, irises & conjunctiva ENT: grossly normal hearing, lips & tongue Neck: no LAD, masses or thyromegaly Cardiovascular: RRR, no m/r/g. No LE edema  in left lower extremity. Telemetry: SR, no arrhythmias  Respiratory: CTA bilaterally, no w/r/r. Normal respiratory effort. Abdomen: soft, ntnd Skin: no rash seen on limited exam Musculoskeletal: RLE BKA wound with dehiscence and serosanguineous discharge noted. Psychiatric: grossly normal mood and affect, speech fluent and appropriate Neurologic: grossly non-focal.          Labs on Admission:  Basic Metabolic Panel:  Recent Labs Lab 10/13/13 1616  NA 146  K 4.1  CL 100  CO2 28  GLUCOSE 82  BUN 33*  CREATININE 7.99*  CALCIUM 10.2   Liver Function Tests: No results found for this basename: AST, ALT, ALKPHOS, BILITOT, PROT, ALBUMIN,  in the last 168 hours No results found for this basename: LIPASE, AMYLASE,  in the last 168 hours No results found for this basename: AMMONIA,  in the last 168 hours CBC:  Recent Labs Lab 10/13/13 1616  WBC 11.0*  NEUTROABS 8.6*  HGB 9.7*  HCT 31.5*  MCV 92.9  PLT 314   Cardiac Enzymes: No results found for this basename: CKTOTAL, CKMB, CKMBINDEX, TROPONINI,  in the last 168 hours  BNP (last 3 results) No results found for this basename: PROBNP,  in the last 8760 hours CBG:  Recent Labs Lab 10/13/13 1435  GLUCAP 83    Radiological Exams on Admission: No results  found.  Assessment/Plan Active Problems:   Anemia, chronic disease   Atrial fibrillation   ESRD (end stage renal disease) on dialysis   Right BKA infection   Wound infection   1. Wound infection. Patient is found to have a below the knee amputation stump wound infection. He will be started empirically on IV antibiotics, vancomycin and Zosyn. We will request surgery to see the patient. He may need above-the-knee amputation. 2. End-stage renal disease on hemodialysis. Nephrology consultation for dialysis needs. His next routine dialysis will be tomorrow. 3. Atrial fibrillation. Continue Cardizem and amiodarone. Hold Coumadin for now since patient may need further surgery. 4. Anemia of chronic disease hemoglobin is at baseline. Continue to follow.  Code Status: full code Family Communication: discussed with patient Disposition Plan: discharge home once improved  Time spent: 68mins  Greg Cratty [facility] Pager [phone]

## 2013-10-13 NOTE — Progress Notes (Signed)
ANTIBIOTIC CONSULT NOTE - INITIAL  Pharmacy Consult for Vancomycin & Zosyn Indication: Wound infection  Allergies  Allergen Reactions  . Bacitracin Other (See Comments)    unknown  . Norvasc [Amlodipine Besylate] Other (See Comments)    unknown    Patient Measurements: Height: 6\' 2"  (188 cm) Weight: 210 lb (95.255 kg) IBW/kg (Calculated) : 82.2 Adjusted Body Weight:   Vital Signs: Temp: 97.9 F (36.6 C) (02/16 2033) Temp src: Oral (02/16 2033) BP: 137/66 mmHg (02/16 2033) Pulse Rate: 96 (02/16 2033) Intake/Output from previous day:   Intake/Output from this shift:    Labs:  Recent Labs  10/13/13 1616  WBC 11.0*  HGB 9.7*  PLT 314  CREATININE 7.99*   Estimated Creatinine Clearance: 10.9 ml/min (by C-G formula based on Cr of 7.99). No results found for this basename: VANCOTROUGH, Corlis Leak, VANCORANDOM, GENTTROUGH, GENTPEAK, GENTRANDOM, TOBRATROUGH, TOBRAPEAK, TOBRARND, AMIKACINPEAK, AMIKACINTROU, AMIKACIN,  in the last 72 hours   Microbiology: Recent Results (from the past 720 hour(s))  WOUND CULTURE     Status: None   Collection Time    09/17/13  9:00 AM      Result Value Ref Range Status   Specimen Description WOUND LEG   Final   Special Requests NONE   Final   Gram Stain     Final   Value: RARE WBC PRESENT, PREDOMINANTLY PMN     NO SQUAMOUS EPITHELIAL CELLS SEEN     NO ORGANISMS SEEN     Performed at [facility]   Culture     Final   Value: FEW METHICILLIN RESISTANT STAPHYLOCOCCUS AUREUS     Note: RIFAMPIN AND GENTAMICIN SHOULD NOT BE USED AS SINGLE DRUGS FOR TREATMENT OF STAPH INFECTIONS. This organism is presumed to be Clindamycin resistant based on detection of inducible Clindamycin resistance. CRITICAL RESULT CALLED TO, READ BACK BY AND      VERIFIED WITH: N. ROWE 1/24 @1015  BY REAMM     FEW PROTEUS MIRABILIS     Performed at [facility]   Report Status 09/20/2013 FINAL   Final   Organism ID, Bacteria METHICILLIN RESISTANT  STAPHYLOCOCCUS AUREUS   Final   Organism ID, Bacteria PROTEUS MIRABILIS   Final  MRSA PCR SCREENING     Status: Abnormal   Collection Time    09/18/13 12:10 AM      Result Value Ref Range Status   MRSA by PCR POSITIVE (*) NEGATIVE Final   Comment:            The GeneXpert MRSA Assay (FDA     approved for NASAL specimens     only), is one component of a     comprehensive MRSA colonization     surveillance program. It is not     intended to diagnose MRSA     infection nor to guide or     monitor treatment for     MRSA infections.     RESULT CALLED TO, READ BACK BY AND VERIFIED WITH:     Daun Peacock AT 0203 ON [id number] BY FORSYTH K    Medical History: Past Medical History  Diagnosis Date  . UGI bleed 02/12/11    on anticoagulation; EGD w/snare polypectomy-multiple polypoid lesions antrum(benign), Chronic active gastritis (NEGATIVE H pylori)  . Anemia, chronic disease   . Sepsis due to enterococcus 02/11/11  . Atrial fibrillation     coumadin  . CAD (coronary artery disease)     s/p CABGx4  . Cardiomyopathy  40-45% EF 6/12  . Diabetes mellitus   . Hypertension   . Hyperlipemia   . Gout   . S/P patent foramen ovale closure   . History of nuclear stress test 11/2011    negative myoview  . End stage kidney disease     T/TH/SAT dialyis, Dr Hinda Lenis  . Gout   . History of nuclear stress test 07/04/2010    dipyridamole; mod fixed inferolateral defect; non-diagnostic for ischemia; low risk scan   . Hyperbilirubinemia 08/10/2013    Medications:  Scheduled:  . allopurinol  100 mg Oral Daily  . amiodarone  100 mg Oral Daily  . aspirin  81 mg Oral Daily  . atorvastatin  20 mg Oral Daily  . diltiazem  180 mg Oral Daily  . enoxaparin (LOVENOX) injection  30 mg Subcutaneous Q24H  . folic acid  1 mg Oral Daily  . insulin glargine  10 Units Subcutaneous QHS  . metoprolol tartrate  25 mg Oral TID  . pantoprazole  40 mg Oral Daily  . pantoprazole  40 mg Oral BID  .  piperacillin-tazobactam (ZOSYN)  IV  2.25 g Intravenous 3 times per day  . [START ON 10/14/2013] sevelamer carbonate  800 mg Oral TID WC  . sodium chloride  3 mL Intravenous Q12H  . traZODone  50 mg Oral QHS  . 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

## 2013-10-14 LAB — BASIC METABOLIC PANEL
BUN: 38 mg/dL — ABNORMAL HIGH (ref 6–23)
CHLORIDE: 101 meq/L (ref 96–112)
CO2: 28 meq/L (ref 19–32)
Calcium: 9.6 mg/dL (ref 8.4–10.5)
Creatinine, Ser: 8.85 mg/dL — ABNORMAL HIGH (ref 0.50–1.35)
GFR calc Af Amer: 6 mL/min — ABNORMAL LOW (ref >90)
GFR calc non Af Amer: 6 mL/min — ABNORMAL LOW (ref >90)
Glucose, Bld: 118 mg/dL — ABNORMAL HIGH (ref 70–99)
POTASSIUM: 4 meq/L (ref 3.7–5.3)
Sodium: 144 mEq/L (ref 137–147)

## 2013-10-14 LAB — CBC
HEMATOCRIT: 29.1 % — AB (ref 39.0–52.0)
HEMOGLOBIN: 8.9 g/dL — AB (ref 13.0–17.0)
MCH: 28.3 pg (ref 26.0–34.0)
MCHC: 30.6 g/dL (ref 30.0–36.0)
MCV: 92.4 fL (ref 78.0–100.0)
Platelets: 291 10*3/uL (ref 150–400)
RBC: 3.15 MIL/uL — ABNORMAL LOW (ref 4.22–5.81)
RDW: 17 % — ABNORMAL HIGH (ref 11.5–15.5)
WBC: 9.2 10*3/uL (ref 4.0–10.5)

## 2013-10-14 LAB — GLUCOSE, CAPILLARY
GLUCOSE-CAPILLARY: 74 mg/dL (ref 70–99)
GLUCOSE-CAPILLARY: 169 mg/dL — AB (ref 70–99)
Glucose-Capillary: 90 mg/dL (ref 70–99)
Glucose-Capillary: 102 mg/dL — ABNORMAL HIGH (ref 70–99)

## 2013-10-14 LAB — PROTIME-INR
INR: 1.38 (ref 0.00–1.49)
Prothrombin Time: 16.6 seconds — ABNORMAL HIGH (ref 11.6–15.2)

## 2013-10-14 MED ORDER — PENTAFLUOROPROP-TETRAFLUOROETH EX AERO
1.0000 "application " | INHALATION_SPRAY | CUTANEOUS | Status: DC | PRN
  Filled 2013-10-14: qty 103.5

## 2013-10-14 MED ORDER — VANCOMYCIN HCL IN DEXTROSE 1-5 GM/200ML-% IV SOLN
1000.0000 mg | INTRAVENOUS | Status: DC
  Administered 2013-10-14 – 2013-10-18 (×3): 1000 mg via INTRAVENOUS
  Filled 2013-10-14 (×3): qty 200

## 2013-10-14 MED ORDER — SODIUM CHLORIDE 0.9 % IV SOLN: 100.0000 mL | INTRAVENOUS | Status: DC | PRN

## 2013-10-14 MED ORDER — INSULIN ASPART 100 UNIT/ML ~~LOC~~ SOLN: 0.0000 [IU] | Freq: Every day | SUBCUTANEOUS | Status: DC

## 2013-10-14 MED ORDER — VANCOMYCIN HCL IN DEXTROSE 1-5 GM/200ML-% IV SOLN
INTRAVENOUS | Status: AC
  Filled 2013-10-14: qty 200

## 2013-10-14 MED ORDER — EPOETIN ALFA 20000 UNIT/ML IJ SOLN
14000.0000 [IU] | INTRAMUSCULAR | Status: DC
  Administered 2013-10-14 – 2013-10-18 (×3): 14000 [IU] via INTRAVENOUS
  Filled 2013-10-14 (×4): qty 1

## 2013-10-14 MED ORDER — ALTEPLASE 2 MG IJ SOLR
2.0000 mg | Freq: Once | INTRAMUSCULAR | Status: AC | PRN
  Filled 2013-10-14: qty 2

## 2013-10-14 MED ORDER — LIDOCAINE HCL (PF) 1 % IJ SOLN: 5.0000 mL | INTRAMUSCULAR | Status: DC | PRN

## 2013-10-14 MED ORDER — INSULIN ASPART 100 UNIT/ML ~~LOC~~ SOLN
0.0000 [IU] | Freq: Three times a day (TID) | SUBCUTANEOUS | Status: DC
  Administered 2013-10-15: 1 [IU] via SUBCUTANEOUS
  Administered 2013-10-15 – 2013-10-16 (×2): 2 [IU] via SUBCUTANEOUS
  Administered 2013-10-17: 1 [IU] via SUBCUTANEOUS
  Administered 2013-10-20: 2 [IU] via SUBCUTANEOUS

## 2013-10-14 MED ORDER — LIDOCAINE-PRILOCAINE 2.5-2.5 % EX CREA: 1.0000 "application " | TOPICAL_CREAM | CUTANEOUS | Status: DC | PRN

## 2013-10-14 MED ORDER — NEPRO/CARBSTEADY PO LIQD: 237.0000 mL | ORAL | Status: DC | PRN

## 2013-10-14 MED ORDER — HEPARIN SODIUM (PORCINE) 1000 UNIT/ML DIALYSIS
1000.0000 [IU] | INTRAMUSCULAR | Status: DC | PRN
  Filled 2013-10-14: qty 1

## 2013-10-14 NOTE — Progress Notes (Signed)
ANTIBIOTIC CONSULT NOTE - follow up  Pharmacy Consult for Vancomycin & Zosyn Indication: Wound infection  Allergies  Allergen Reactions  . Bacitracin Other (See Comments)    unknown  . Norvasc [Amlodipine Besylate] Other (See Comments)    unknown   Patient Measurements: Height: 6\' 2"  (188 cm) Weight: 212 lb 11.9 oz (96.5 kg) IBW/kg (Calculated) : 82.2  Vital Signs: BP: 105/68 mmHg (02/17 0900) Pulse Rate: 55 (02/17 0900) Intake/Output from previous day:   Intake/Output from this shift:    Labs:  Recent Labs  10/13/13 1616 10/14/13 0558  WBC 11.0* 9.2  HGB 9.7* 8.9*  PLT 314 291  CREATININE 7.99* 8.85*   Estimated Creatinine Clearance: 9.8 ml/min (by C-G formula based on Cr of 8.85). No results found for this basename: VANCOTROUGH, Corlis Leak, VANCORANDOM, GENTTROUGH, GENTPEAK, GENTRANDOM, TOBRATROUGH, TOBRAPEAK, TOBRARND, AMIKACINPEAK, AMIKACINTROU, AMIKACIN,  in the last 72 hours   Microbiology: Recent Results (from the past 720 hour(s))  WOUND CULTURE     Status: None   Collection Time    09/17/13  9:00 AM      Result Value Ref Range Status   Specimen Description WOUND LEG   Final   Special Requests NONE   Final   Gram Stain     Final   Value: RARE WBC PRESENT, PREDOMINANTLY PMN     NO SQUAMOUS EPITHELIAL CELLS SEEN     NO ORGANISMS SEEN     Performed at [facility]   Culture     Final   Value: FEW METHICILLIN RESISTANT STAPHYLOCOCCUS AUREUS     Note: RIFAMPIN AND GENTAMICIN SHOULD NOT BE USED AS SINGLE DRUGS FOR TREATMENT OF STAPH INFECTIONS. This organism is presumed to be Clindamycin resistant based on detection of inducible Clindamycin resistance. CRITICAL RESULT CALLED TO, READ BACK BY AND      VERIFIED WITH: N. ROWE 1/24 @1015  BY REAMM     FEW PROTEUS MIRABILIS     Performed at [facility]   Report Status 09/20/2013 FINAL   Final   Organism ID, Bacteria METHICILLIN RESISTANT STAPHYLOCOCCUS AUREUS   Final   Organism ID, Bacteria  PROTEUS MIRABILIS   Final  MRSA PCR SCREENING     Status: Abnormal   Collection Time    09/18/13 12:10 AM      Result Value Ref Range Status   MRSA by PCR POSITIVE (*) NEGATIVE Final   Comment:            The GeneXpert MRSA Assay (FDA     approved for NASAL specimens     only), is one component of a     comprehensive MRSA colonization     surveillance program. It is not     intended to diagnose MRSA     infection nor to guide or     monitor treatment for     MRSA infections.     RESULT CALLED TO, READ BACK BY AND VERIFIED WITH:     Daun Peacock AT 0203 ON [id number] BY FORSYTH K   Medical History: Past Medical History  Diagnosis Date  . UGI bleed 02/12/11    on anticoagulation; EGD w/snare polypectomy-multiple polypoid lesions antrum(benign), Chronic active gastritis (NEGATIVE H pylori)  . Anemia, chronic disease   . Sepsis due to enterococcus 02/11/11  . Atrial fibrillation     coumadin  . CAD (coronary artery disease)     s/p CABGx4  . Cardiomyopathy     40-45% EF 6/12  . Diabetes mellitus   .  Hypertension   . Hyperlipemia   . Gout   . S/P patent foramen ovale closure   . History of nuclear stress test 11/2011    negative myoview  . End stage kidney disease     T/TH/SAT dialyis, Dr Hinda Lenis  . Gout   . History of nuclear stress test 07/04/2010    dipyridamole; mod fixed inferolateral defect; non-diagnostic for ischemia; low risk scan   . Hyperbilirubinemia 08/10/2013   Medications:  Scheduled:  . allopurinol  100 mg Oral Daily  . amiodarone  100 mg Oral Daily  . aspirin  81 mg Oral Daily  . atorvastatin  20 mg Oral q1800  . diltiazem  180 mg Oral Daily  . enoxaparin (LOVENOX) injection  30 mg Subcutaneous Q24H  . epoetin alfa  14,000 Units Intravenous Q T,Th,Sa-HD  . folic acid  1 mg Oral Daily  . insulin glargine  10 Units Subcutaneous QHS  . metoprolol tartrate  25 mg Oral 3 times per day  . pantoprazole  40 mg Oral BID  . piperacillin-tazobactam (ZOSYN)  IV  2.25 g  Intravenous 3 times per day  . sevelamer carbonate  800 mg Oral TID WC  . sodium chloride  3 mL Intravenous Q12H  . traZODone  50 mg Oral QHS  . 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

## 2013-10-14 NOTE — Progress Notes (Signed)
UR completed 

## 2013-10-14 NOTE — Progress Notes (Signed)
[facility] PROGRESS NOTE  [name] [name] PIR:[id number] DOB: 1950-11-23 DOA: 10/13/2013 PCP: [name] [name]., MD  Assessment/Plan: 1. Wound infection. Continue vancomycin and Zosyn for now. General surgery is following. Patient may need above-the-knee amputation. 2. End-stage renal disease on hemodialysis. Patient will undergo dialysis today. 3. Anemia of chronic disease. Hemoglobin is at baseline. No evidence of bleeding 4. Atrial fibrillation. Continue amiodarone and rate control medications. Coumadin is on hold due to possible surgical intervention.  Code Status: Full code Family Communication: Discussed with patient Disposition Plan: Pending hospital course   Consultants:  Nephrology  General surgery  Procedures:    Antibiotics:  Vancomycin 2/16  Zosyn 2/16  HPI/Subjective: No new complaints. No pain, shortness of breath.  Objective: Filed Vitals:   10/14/13 1413  BP: 130/70  Pulse: 95  Temp: 97.2 F (36.2 C)  Resp: 18    Intake/Output Summary (Last 24 hours) at 10/14/13 1720 Last data filed at 10/14/13 1300  Gross per 24 hour  Intake    480 ml  Output      0 ml  Net    480 ml   Filed Weights   10/13/13 1428 10/14/13 0500  Weight: 95.255 kg (210 lb) 96.5 kg (212 lb 11.9 oz)    Exam:   General:  NAD  Cardiovascular: S1, s2 RRR  Respiratory: CTA B  Abdomen: soft, nt, nd, bs+  Musculoskeletal: Right lower extremity stump incision with serosanguineous discharge. Areas of the wound have broken down.   Data Reviewed: Basic Metabolic Panel:  Recent Labs Lab 10/13/13 1616 10/14/13 0558  NA 146 144  K 4.1 4.0  CL 100 101  CO2 28 28  GLUCOSE 82 118*  BUN 33* 38*  CREATININE 7.99* 8.85*  CALCIUM 10.2 9.6   Liver Function Tests: No results found for this basename: AST, ALT, ALKPHOS, BILITOT, PROT, ALBUMIN,  in the last 168 hours No results found for this basename: LIPASE, AMYLASE,  in the last 168 hours No results found for this  basename: AMMONIA,  in the last 168 hours CBC:  Recent Labs Lab 10/13/13 1616 10/14/13 0558  WBC 11.0* 9.2  NEUTROABS 8.6*  --   HGB 9.7* 8.9*  HCT 31.5* 29.1*  MCV 92.9 92.4  PLT 314 291   Cardiac Enzymes: No results found for this basename: CKTOTAL, CKMB, CKMBINDEX, TROPONINI,  in the last 168 hours BNP (last 3 results) No results found for this basename: PROBNP,  in the last 8760 hours CBG:  Recent Labs Lab 10/13/13 1435 10/13/13 1955 10/14/13 0739 10/14/13 1148 10/14/13 1629  GLUCAP 83 74 90 169* 102*    No results found for this or any previous visit (from the past 240 hour(s)).   Studies: No results found.  Scheduled Meds: . allopurinol  100 mg Oral Daily  . amiodarone  100 mg Oral Daily  . aspirin  81 mg Oral Daily  . atorvastatin  20 mg Oral q1800  . diltiazem  180 mg Oral Daily  . enoxaparin (LOVENOX) injection  30 mg Subcutaneous Q24H  . epoetin alfa  14,000 Units Intravenous Q T,Th,Sa-HD  . folic acid  1 mg Oral Daily  . insulin glargine  10 Units Subcutaneous QHS  . metoprolol tartrate  25 mg Oral 3 times per day  . pantoprazole  40 mg Oral BID  . piperacillin-tazobactam (ZOSYN)  IV  2.25 g Intravenous 3 times per day  . sevelamer carbonate  800 mg Oral TID WC  . sodium chloride  3  mL Intravenous Q12H  . traZODone  50 mg Oral QHS  . vancomycin  1,000 mg Intravenous Q T,Th,Sa-HD   Continuous Infusions:   Active Problems:   Anemia, chronic disease   Atrial fibrillation   ESRD (end stage renal disease) on dialysis   Right BKA infection   Wound infection    Time spent: 67mins    [name]  [facility] Pager [phone]. If 7PM-7AM, please contact night-coverage at [email or web address], password [facility] 10/14/2013, 5:20 PM  LOS: 1 day

## 2013-10-14 NOTE — Progress Notes (Signed)
Spoke with [name] [name] in dialysis. Patients amiodarone and cardizem given as per her request. Patient will not go to dialysis until after 7.

## 2013-10-14 NOTE — Care Management Note (Signed)
    Page 1 of 2   10/20/2013     1:23:50 PM   CARE MANAGEMENT NOTE 10/20/2013  Patient:  [name] [name], [name] [name]   Account Number:  [id number]  Date Initiated:  10/14/2013  Documentation initiated by:  Claretha Cooper  Subjective/Objective Assessment:   Pt admitted from home where he lives with his wife. Dialysis pt. Pt to receive AB IV for a couple days prior to possible surgery on Friday.     Action/Plan:   Will follow for [facility] needs   Anticipated DC Date:     Anticipated DC Plan:        DC Planning Services  CM consult      PAC Choice  Resumption Of Svcs/PTA Provider   Choice offered to / List presented to:  C-1 Patient        [place] arranged  HH-1 RN  [place].   Status of service:  Completed, signed off Medicare Important Message given?  YES (If response is "NO", the following Medicare IM given date fields will be blank) Date Medicare IM given:  10/20/2013 Date Additional Medicare IM given:    Discharge Disposition:  [place]  Per UR Regulation:    If discussed at [place] Length of Stay Meetings, dates discussed:    Comments:  10/20/13 [place], RN BSN CM Pt for discharge today with resumption of [facility] RN and PT. Romualdo Bolk of [facility] is aware and will collect the pts information from the chart. [place] services to resume within 48 hours of discharge. No DME needed as pt has walker, w/c, and slide board at the home. Pt and pts nurse aware of discharge arrangements.  10/17/13 Claretha Cooper RN BSN CM AKA today, CM will follow for DC planning  10/14/13 Claretha Cooper RN BSN CM

## 2013-10-14 NOTE — Consult Note (Signed)
Reason for Consult: end-stage renal disease Referring Physician: Dr. Telford Nab [name] [name] is an 63 y.o. male.  HPI: He is a patient was history of diabetes, coronary artery disease status post CABG, history of end-stage renal disease on maintenance hemodialysis presently admitted to the hospital because of non-healing ulcer of his right BKA. Patient had recent right BKA. Initially patient was put in nursing home was was getting physical therapy. After she swallowed condition improved he was discharged home and then patient to hold down and sustained some injury to his DKA. Patient had revision but he continued to have some drainage and non-healing ulcer. Presently patient denies any fever but has some chills. He denies also any nausea vomiting. His regular dialysis is Tuesday Thursday Saturday and his last dialysis was on Saturday.  Past Medical History  Diagnosis Date  . UGI bleed 02/12/11    on anticoagulation; EGD w/snare polypectomy-multiple polypoid lesions antrum(benign), Chronic active gastritis (NEGATIVE H pylori)  . Anemia, chronic disease   . Sepsis due to enterococcus 02/11/11  . Atrial fibrillation     coumadin  . CAD (coronary artery disease)     s/p CABGx4  . Cardiomyopathy     40-45% EF 6/12  . Diabetes mellitus   . Hypertension   . Hyperlipemia   . Gout   . S/P patent foramen ovale closure   . History of nuclear stress test 11/2011    negative myoview  . End stage kidney disease     [name]/TH/SAT dialyis, Dr Hinda Lenis  . Gout   . History of nuclear stress test 07/04/2010    dipyridamole; mod fixed inferolateral defect; non-diagnostic for ischemia; low risk scan   . Hyperbilirubinemia 08/10/2013    Past Surgical History  Procedure Laterality Date  . Cardiac catheterization  12/31/2006    severe diffuse 3 vessel CAD, severe 4+ MR (Dr. Jackie Plum)  . Mitral valve replacement  01/05/2007    bioprosthetic 40m Edwards precordial tissue (Dr. GServando Snare  . Coronary artery bypass graft   01/05/2007    x4; LIMA-LAD, SVG-OM, seq. SVG-PDA, PLA-RCA  . Colonoscopy  06/23/2011    Dr. FOneida Alar multiple sessile and pedunculated polyps, moderate diverticulosis, internal hemorrhoids, +ADENOMATOUS POLYPS, consider genetic testing, surveillance in October 2015   . Esophagogastroduodenoscopy  02/12/2011    CHRONIC ACTIVE GASTRITIS, NO H. pylori  . Transthoracic echocardiogram  11/2011    EF 25% w/ mod dilatation of LV & mod LVH; LA severely dilated; mod-severe dilation of RV with mod-severe decrease in RV function; mild MR & TR  . Cardioversion  04/19/2007    Dr. RMarella Chimes . Transthoracic echocardiogram  02/14/2011    EF 40-45%, mod conc LVH; ventricular septum with motion showing abnormal function, dyssynergy, paradox; mild AS; mild-mod MR stenosis; LA severely dilated; aptrial septum bowed from L to R with increased LA pressure   . Amputation Right 07/14/2013    Procedure: AMPUTATION DIGIT;  Surgeon: [name] MD;  Location: [facility];  Service: General;  Laterality: Right;  . Amputation Right 08/08/2013    Procedure: AMPUTATION BELOW KNEE;  Surgeon: [name] MD;  Location: [facility];  Service: General;  Laterality: Right;  . Stump revision Right 09/19/2013    Procedure: STUMP REVISION;  Surgeon: WScherry Ran MD;  Location: [facility];  Service: General;  Laterality: Right;    Family History  Problem Relation Age of Onset  . Diabetes Mother   . Diabetes Father   . Diabetes Sister   .  Colon cancer Neg Hx     Social History:  reports that he quit smoking about 6 years ago. His smoking use included Cigarettes. He has a 8 pack-year smoking history. He has never used smokeless tobacco. He reports that he does not drink alcohol or use illicit drugs.  Allergies:  Allergies  Allergen Reactions  . Bacitracin Other (See Comments)    unknown  . Norvasc [Amlodipine Besylate] Other (See Comments)    unknown    Medications: I have reviewed the patient's current  medications.  Results for orders placed during the hospital encounter of 10/13/13 (from the past 48 hour(s))  GLUCOSE, CAPILLARY     Status: None   Collection Time    10/13/13  2:35 PM      Result Value Ref Range   Glucose-Capillary 83  70 - 99 mg/dL   Comment 1 Notify RN    CBC WITH DIFFERENTIAL     Status: Abnormal   Collection Time    10/13/13  4:16 PM      Result Value Ref Range   WBC 11.0 (*) 4.0 - 10.5 K/uL   RBC 3.39 (*) 4.22 - 5.81 MIL/uL   Hemoglobin 9.7 (*) 13.0 - 17.0 g/dL   HCT 31.5 (*) 39.0 - 52.0 %   MCV 92.9  78.0 - 100.0 fL   MCH 28.6  26.0 - 34.0 pg   MCHC 30.8  30.0 - 36.0 g/dL   RDW 17.0 (*) 11.5 - 15.5 %   Platelets 314  150 - 400 K/uL   Neutrophils Relative % 78 (*) 43 - 77 %   Neutro Abs 8.6 (*) 1.7 - 7.7 K/uL   Lymphocytes Relative 10 (*) 12 - 46 %   Lymphs Abs 1.1  0.7 - 4.0 K/uL   Monocytes Relative 7  3 - 12 %   Monocytes Absolute 0.7  0.1 - 1.0 K/uL   Eosinophils Relative 5  0 - 5 %   Eosinophils Absolute 0.5  0.0 - 0.7 K/uL   Basophils Relative 0  0 - 1 %   Basophils Absolute 0.0  0.0 - 0.1 K/uL  BASIC METABOLIC PANEL     Status: Abnormal   Collection Time    10/13/13  4:16 PM      Result Value Ref Range   Sodium 146  137 - 147 mEq/L   Potassium 4.1  3.7 - 5.3 mEq/L   Chloride 100  96 - 112 mEq/L   CO2 28  19 - 32 mEq/L   Glucose, Bld 82  70 - 99 mg/dL   BUN 33 (*) 6 - 23 mg/dL   Creatinine, Ser 7.99 (*) 0.50 - 1.35 mg/dL   Calcium 10.2  8.4 - 10.5 mg/dL   GFR calc non Af Amer 6 (*) >90 mL/min   GFR calc Af Amer 7 (*) >90 mL/min   Comment: (NOTE)     The eGFR has been calculated using the CKD EPI equation.     This calculation has not been validated in all clinical situations.     eGFR's persistently <90 mL/min signify possible Chronic Kidney     Disease.  GLUCOSE, CAPILLARY     Status: None   Collection Time    10/13/13  7:55 PM      Result Value Ref Range   Glucose-Capillary 74  70 - 99 mg/dL  BASIC METABOLIC PANEL     Status:  Abnormal   Collection Time    10/14/13  5:58 AM  Result Value Ref Range   Sodium 144  137 - 147 mEq/L   Potassium 4.0  3.7 - 5.3 mEq/L   Chloride 101  96 - 112 mEq/L   CO2 28  19 - 32 mEq/L   Glucose, Bld 118 (*) 70 - 99 mg/dL   BUN 38 (*) 6 - 23 mg/dL   Creatinine, Ser 8.85 (*) 0.50 - 1.35 mg/dL   Calcium 9.6  8.4 - 10.5 mg/dL   GFR calc non Af Amer 6 (*) >90 mL/min   GFR calc Af Amer 6 (*) >90 mL/min   Comment: (NOTE)     The eGFR has been calculated using the CKD EPI equation.     This calculation has not been validated in all clinical situations.     eGFR's persistently <90 mL/min signify possible Chronic Kidney     Disease.  CBC     Status: Abnormal   Collection Time    10/14/13  5:58 AM      Result Value Ref Range   WBC 9.2  4.0 - 10.5 K/uL   RBC 3.15 (*) 4.22 - 5.81 MIL/uL   Hemoglobin 8.9 (*) 13.0 - 17.0 g/dL   HCT 29.1 (*) 39.0 - 52.0 %   MCV 92.4  78.0 - 100.0 fL   MCH 28.3  26.0 - 34.0 pg   MCHC 30.6  30.0 - 36.0 g/dL   RDW 17.0 (*) 11.5 - 15.5 %   Platelets 291  150 - 400 K/uL  PROTIME-INR     Status: Abnormal   Collection Time    10/14/13  5:58 AM      Result Value Ref Range   Prothrombin Time 16.6 (*) 11.6 - 15.2 seconds   INR 1.38  0.00 - 1.49  GLUCOSE, CAPILLARY     Status: None   Collection Time    10/14/13  7:39 AM      Result Value Ref Range   Glucose-Capillary 90  70 - 99 mg/dL   Comment 1 Notify RN      No results found.  Review of Systems  Constitutional: Positive for chills. Negative for fever.  Respiratory: Negative for cough, sputum production and shortness of breath.   Cardiovascular: Negative for palpitations and orthopnea.  Gastrointestinal: Negative for nausea and vomiting.   Blood pressure 105/68, pulse 55, temperature 97.9 F (36.6 C), temperature source Oral, resp. rate 18, height 6' 2"  (1.88 m), weight 96.5 kg (212 lb 11.9 oz), SpO2 98.00%. Physical Exam  Constitutional: He is oriented to person, place, and time.  Eyes: No  scleral icterus.  Neck: No JVD present.  Cardiovascular: Normal rate and regular rhythm.   Murmur heard. Respiratory: He has no wheezes. He has no rales.  GI: He exhibits no distension. There is no tenderness.  Musculoskeletal: He exhibits edema.  Neurological: He is alert and oriented to person, place, and time.    Assessment/Plan: Problem #1 end-stage renal disease he status post hemodialysis on Saturday. Patient is due for dialysis today. Presently he denies any nausea vomiting. November 2 history of coronary disease status post CABG Problem #3 a trial fibrillation his heart rate is controlled Problem #4 history of diabetes Problem #5 status post atrial valve replacement Problem #6 status post revision of his right BKA presently with non-healing ulcer. Problem #7 metabolic bone disease his calcium is range. Plan: We'll make arrangements for patient to get dialysis today. We'll use Epogen 14,000 units IV after each dialysis. We'll check his basic metabolic panel,  phosphorus and CBC in the morning. We'll hold heparin.   Timarie Labell S 10/14/2013, 11:00 AM

## 2013-10-14 NOTE — Consult Note (Signed)
Reason for Consult: Wound breakdown, right lobe the knee amputation Referring Physician: [facility]  [name] [name] is an 62 y.o. male.  HPI: Patient is a 9 to black male multiple medical problems who is well known to the surgical service. 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 He subsequently went to a nursing home for rehabilitation. Approximately 2 days after he was discharged, he fell in the shower broke open his right below-the-knee amputation wound. This was revised on 09/19/2013. I have been following him in my office and he has had a home health nurse. The wound has continued to heal poorly and presented to the emergency room yesterday with poor wound healing. He denies any pain in his right lower extremity. He denies any fever or chills.  Past Medical History  Diagnosis Date  . UGI bleed 02/12/11    on anticoagulation; EGD w/snare polypectomy-multiple polypoid lesions antrum(benign), Chronic active gastritis (NEGATIVE H pylori)  . Anemia, chronic disease   . Sepsis due to enterococcus 02/11/11  . Atrial fibrillation     coumadin  . CAD (coronary artery disease)     s/p CABGx4  . Cardiomyopathy     40-45% EF 6/12  . Diabetes mellitus   . Hypertension   . Hyperlipemia   . Gout   . S/P patent foramen ovale closure   . History of nuclear stress test 11/2011    negative myoview  . End stage kidney disease     T/TH/SAT dialyis, Dr Hinda Lenis  . Gout   . History of nuclear stress test 07/04/2010    dipyridamole; mod fixed inferolateral defect; non-diagnostic for ischemia; low risk scan   . Hyperbilirubinemia 08/10/2013    Past Surgical History  Procedure Laterality Date  . Cardiac catheterization  12/31/2006    severe diffuse 3 vessel CAD, severe 4+ MR (Dr. Jackie Plum)  . Mitral valve replacement  01/05/2007    bioprosthetic 84m Edwards precordial tissue (Dr. GServando Snare  . Coronary artery bypass graft  01/05/2007    x4; LIMA-LAD, SVG-OM, seq. SVG-PDA,  PLA-RCA  . Colonoscopy  06/23/2011    Dr. FOneida Alar multiple sessile and pedunculated polyps, moderate diverticulosis, internal hemorrhoids, +ADENOMATOUS POLYPS, consider genetic testing, surveillance in October 2015   . Esophagogastroduodenoscopy  02/12/2011    CHRONIC ACTIVE GASTRITIS, NO H. pylori  . Transthoracic echocardiogram  11/2011    EF 25% w/ mod dilatation of LV & mod LVH; LA severely dilated; mod-severe dilation of RV with mod-severe decrease in RV function; mild MR & TR  . Cardioversion  04/19/2007    Dr. RMarella Chimes . Transthoracic echocardiogram  02/14/2011    EF 40-45%, mod conc LVH; ventricular septum with motion showing abnormal function, dyssynergy, paradox; mild AS; mild-mod MR stenosis; LA severely dilated; aptrial septum bowed from L to R with increased LA pressure   . Amputation Right 07/14/2013    Procedure: AMPUTATION DIGIT;  Surgeon: [name] MD;  Location: [facility];  Service: General;  Laterality: Right;  . Amputation Right 08/08/2013    Procedure: AMPUTATION BELOW KNEE;  Surgeon: [name] MD;  Location: [facility];  Service: General;  Laterality: Right;  . Stump revision Right 09/19/2013    Procedure: STUMP REVISION;  Surgeon: WScherry Ran MD;  Location: [facility];  Service: General;  Laterality: Right;    Family History  Problem Relation Age of Onset  . Diabetes Mother   . Diabetes Father   . Diabetes Sister   .  Colon cancer Neg Hx     Social History:  reports that he quit smoking about 6 years ago. His smoking use included Cigarettes. He has a 8 pack-year smoking history. He has never used smokeless tobacco. He reports that he does not drink alcohol or use illicit drugs.  Allergies:  Allergies  Allergen Reactions  . Bacitracin Other (See Comments)    unknown  . Norvasc [Amlodipine Besylate] Other (See Comments)    unknown    Medications: I have reviewed the patient's current medications.  Results for orders placed during the hospital  encounter of 10/13/13 (from the past 48 hour(s))  GLUCOSE, CAPILLARY     Status: None   Collection Time    10/13/13  2:35 PM      Result Value Ref Range   Glucose-Capillary 83  70 - 99 mg/dL   Comment 1 Notify RN    CBC WITH DIFFERENTIAL     Status: Abnormal   Collection Time    10/13/13  4:16 PM      Result Value Ref Range   WBC 11.0 (*) 4.0 - 10.5 K/uL   RBC 3.39 (*) 4.22 - 5.81 MIL/uL   Hemoglobin 9.7 (*) 13.0 - 17.0 g/dL   HCT 31.5 (*) 39.0 - 52.0 %   MCV 92.9  78.0 - 100.0 fL   MCH 28.6  26.0 - 34.0 pg   MCHC 30.8  30.0 - 36.0 g/dL   RDW 17.0 (*) 11.5 - 15.5 %   Platelets 314  150 - 400 K/uL   Neutrophils Relative % 78 (*) 43 - 77 %   Neutro Abs 8.6 (*) 1.7 - 7.7 K/uL   Lymphocytes Relative 10 (*) 12 - 46 %   Lymphs Abs 1.1  0.7 - 4.0 K/uL   Monocytes Relative 7  3 - 12 %   Monocytes Absolute 0.7  0.1 - 1.0 K/uL   Eosinophils Relative 5  0 - 5 %   Eosinophils Absolute 0.5  0.0 - 0.7 K/uL   Basophils Relative 0  0 - 1 %   Basophils Absolute 0.0  0.0 - 0.1 K/uL  BASIC METABOLIC PANEL     Status: Abnormal   Collection Time    10/13/13  4:16 PM      Result Value Ref Range   Sodium 146  137 - 147 mEq/L   Potassium 4.1  3.7 - 5.3 mEq/L   Chloride 100  96 - 112 mEq/L   CO2 28  19 - 32 mEq/L   Glucose, Bld 82  70 - 99 mg/dL   BUN 33 (*) 6 - 23 mg/dL   Creatinine, Ser 7.99 (*) 0.50 - 1.35 mg/dL   Calcium 10.2  8.4 - 10.5 mg/dL   GFR calc non Af Amer 6 (*) >90 mL/min   GFR calc Af Amer 7 (*) >90 mL/min   Comment: (NOTE)     The eGFR has been calculated using the CKD EPI equation.     This calculation has not been validated in all clinical situations.     eGFR's persistently <90 mL/min signify possible Chronic Kidney     Disease.  GLUCOSE, CAPILLARY     Status: None   Collection Time    10/13/13  7:55 PM      Result Value Ref Range   Glucose-Capillary 74  70 - 99 mg/dL  BASIC METABOLIC PANEL     Status: Abnormal   Collection Time    10/14/13  5:58 AM  Result  Value Ref Range   Sodium 144  137 - 147 mEq/L   Potassium 4.0  3.7 - 5.3 mEq/L   Chloride 101  96 - 112 mEq/L   CO2 28  19 - 32 mEq/L   Glucose, Bld 118 (*) 70 - 99 mg/dL   BUN 38 (*) 6 - 23 mg/dL   Creatinine, Ser 8.85 (*) 0.50 - 1.35 mg/dL   Calcium 9.6  8.4 - 10.5 mg/dL   GFR calc non Af Amer 6 (*) >90 mL/min   GFR calc Af Amer 6 (*) >90 mL/min   Comment: (NOTE)     The eGFR has been calculated using the CKD EPI equation.     This calculation has not been validated in all clinical situations.     eGFR's persistently <90 mL/min signify possible Chronic Kidney     Disease.  CBC     Status: Abnormal   Collection Time    10/14/13  5:58 AM      Result Value Ref Range   WBC 9.2  4.0 - 10.5 K/uL   RBC 3.15 (*) 4.22 - 5.81 MIL/uL   Hemoglobin 8.9 (*) 13.0 - 17.0 g/dL   HCT 29.1 (*) 39.0 - 52.0 %   MCV 92.4  78.0 - 100.0 fL   MCH 28.3  26.0 - 34.0 pg   MCHC 30.6  30.0 - 36.0 g/dL   RDW 17.0 (*) 11.5 - 15.5 %   Platelets 291  150 - 400 K/uL  PROTIME-INR     Status: Abnormal   Collection Time    10/14/13  5:58 AM      Result Value Ref Range   Prothrombin Time 16.6 (*) 11.6 - 15.2 seconds   INR 1.38  0.00 - 1.49  GLUCOSE, CAPILLARY     Status: None   Collection Time    10/14/13  7:39 AM      Result Value Ref Range   Glucose-Capillary 90  70 - 99 mg/dL   Comment 1 Notify RN      No results found.  ROS: See chart Blood pressure 137/66, pulse 96, temperature 97.9 F (36.6 C), temperature source Oral, resp. rate 18, height 6' 2" (1.88 m), weight 96.5 kg (212 lb 11.9 oz), SpO2 98.00%. Physical Exam: Pleasant black male in no acute distress. Right lower extremity incision site healing poorly with ischemic changes along the wound as well as over the distal stump flap. Sutures are still present, but portions of the wound have broken down. A right femoral pulse is present.  Assessment/Plan: Impression: Wound breakdown, status post traumatic disruption of right below-the-knee  amputation site. He has been admitted by the hospitalist for IV vancomycin and Zosyn. There is a strong likelihood that this will need to be revised to a right above-the-knee amputation. This has been explained to the patient. Will see how the wound progresses over the next few days.  [name],MARK A 10/14/2013, 7:52 AM      

## 2013-10-14 NOTE — Procedures (Signed)
   HEMODIALYSIS TREATMENT NOTE:  4 hour heparin-free dialysis completed via right forearm aVF (15g/antegrade).  Goal NOT met:  BP unable to tolerate removal of 2.5 liters as prescribed.  Ultrafiltration was interrupted x 18 minutes for SBP<90 (asymptomatic).  Net UF 2.1 liters.  Epogen and Vancomycin given.  All blood was reinfused and hemostasis was achieved within 10 minutes.  Report given to Leroy Kennedy, RN.  Jessamine Barcia L. Buryl Bamber, RN, CDN

## 2013-10-15 DIAGNOSIS — T8130XA Disruption of wound, unspecified, initial encounter: Secondary | ICD-10-CM

## 2013-10-15 DIAGNOSIS — E1129 Type 2 diabetes mellitus with other diabetic kidney complication: Secondary | ICD-10-CM

## 2013-10-15 LAB — CBC
HEMATOCRIT: 29.9 % — AB (ref 39.0–52.0)
Hemoglobin: 9.2 g/dL — ABNORMAL LOW (ref 13.0–17.0)
MCH: 28.5 pg (ref 26.0–34.0)
MCHC: 30.8 g/dL (ref 30.0–36.0)
MCV: 92.6 fL (ref 78.0–100.0)
Platelets: 235 10*3/uL (ref 150–400)
RBC: 3.23 MIL/uL — ABNORMAL LOW (ref 4.22–5.81)
RDW: 16.9 % — ABNORMAL HIGH (ref 11.5–15.5)
WBC: 8.1 10*3/uL (ref 4.0–10.5)

## 2013-10-15 LAB — GLUCOSE, CAPILLARY
GLUCOSE-CAPILLARY: 77 mg/dL (ref 70–99)
GLUCOSE-CAPILLARY: 127 mg/dL — AB (ref 70–99)
Glucose-Capillary: 110 mg/dL — ABNORMAL HIGH (ref 70–99)
Glucose-Capillary: 190 mg/dL — ABNORMAL HIGH (ref 70–99)
Glucose-Capillary: 129 mg/dL — ABNORMAL HIGH (ref 70–99)

## 2013-10-15 LAB — BASIC METABOLIC PANEL
BUN: 18 mg/dL (ref 6–23)
CHLORIDE: 98 meq/L (ref 96–112)
CO2: 29 meq/L (ref 19–32)
CREATININE: 5.38 mg/dL — AB (ref 0.50–1.35)
Calcium: 9.2 mg/dL (ref 8.4–10.5)
GFR calc Af Amer: 12 mL/min — ABNORMAL LOW (ref >90)
GFR calc non Af Amer: 10 mL/min — ABNORMAL LOW (ref >90)
GLUCOSE: 95 mg/dL (ref 70–99)
Potassium: 3.6 mEq/L — ABNORMAL LOW (ref 3.7–5.3)
Sodium: 142 mEq/L (ref 137–147)

## 2013-10-15 LAB — PHOSPHORUS: Phosphorus: 2.6 mg/dL (ref 2.3–4.6)

## 2013-10-15 MED ORDER — TRAMADOL HCL 50 MG PO TABS
50.0000 mg | ORAL_TABLET | Freq: Two times a day (BID) | ORAL | Status: DC | PRN
  Administered 2013-10-16 – 2013-10-20 (×7): 50 mg via ORAL
  Filled 2013-10-15 (×7): qty 1

## 2013-10-15 NOTE — Progress Notes (Signed)
Dressing to right stump changed as per physician ok. Patient c/o pain. Ultram given as ordered. Tolerated well.

## 2013-10-15 NOTE — Progress Notes (Signed)
[name] [name]  MRN: [id number]  DOB/AGE: 04-10-1951 63 y.o.  Primary Care Physician:FUSCO,LAWRENCE J., MD  Admit date: 10/13/2013  Chief Complaint:  Chief Complaint  Patient presents with  . Drainage from Incision    S-Pt presented on  10/13/2013 with  Chief Complaint  Patient presents with  . Drainage from Incision  .    Pt offers no new complaints   Meds  . allopurinol  100 mg Oral Daily  . amiodarone  100 mg Oral Daily  . aspirin  81 mg Oral Daily  . atorvastatin  20 mg Oral q1800  . diltiazem  180 mg Oral Daily  . enoxaparin (LOVENOX) injection  30 mg Subcutaneous Q24H  . epoetin alfa  14,000 Units Intravenous Q T,Th,Sa-HD  . folic acid  1 mg Oral Daily  . insulin aspart  0-5 Units Subcutaneous QHS  . insulin aspart  0-9 Units Subcutaneous TID WC  . insulin glargine  10 Units Subcutaneous QHS  . metoprolol tartrate  25 mg Oral 3 times per day  . pantoprazole  40 mg Oral BID  . piperacillin-tazobactam (ZOSYN)  IV  2.25 g Intravenous 3 times per day  . sevelamer carbonate  800 mg Oral TID WC  . sodium chloride  3 mL Intravenous Q12H  . traZODone  50 mg Oral QHS  . vancomycin  1,000 mg Intravenous Q T,Th,Sa-HD       Physical Exam: Vital signs in last 24 hours: Temp:  [97.2 F (36.2 C)-98 F (36.7 C)] 97.3 F (36.3 C) (02/18 0513) Pulse Rate:  [55-96] 96 (02/18 0513) Resp:  [18-20] 18 (02/18 0513) BP: (88-130)/(41-78) 110/76 mmHg (02/18 0513) SpO2:  [94 %-97 %] 94 % (02/18 0513) Weight:  [205 lb 11 oz (93.3 kg)-210 lb 8.6 oz (95.5 kg)] 206 lb 12.7 oz (93.8 kg) (02/18 0500) Weight change: 8.6 oz (0.245 kg) Last BM Date: 10/14/13  Intake/Output from previous day: 02/17 0701 - 02/18 0700 In: 720 [P.O.:720] Out: 2158      Physical Exam: General- pt is awake,alert, oriented to time place and person  Resp- No acute REsp distress, CTA B/L NO Rhonchi  CVS- S1S2 regular in rate and rhythm  GIT- BS+, soft, NT, ND  EXT- NO LE Edema, Cyanosis, Right BKA   Access- AVF+ Bruit    Lab Results: CBC  Recent Labs  10/14/13 0558 10/15/13 0549  WBC 9.2 8.1  HGB 8.9* 9.2*  HCT 29.1* 29.9*  PLT 291 235    BMET  Recent Labs  10/14/13 0558 10/15/13 0549  NA 144 142  K 4.0 3.6*  CL 101 98  CO2 28 29  GLUCOSE 118* 95  BUN 38* 18  CREATININE 8.85* 5.38*  CALCIUM 9.6 9.2    MICRO No results found for this or any previous visit (from the past 240 hour(s)).    Lab Results  Component Value Date   CALCIUM 9.2 10/15/2013   PHOS 2.6 10/15/2013     Impression: 1)Renal  ESRD on HD  ON TTS schedule  Pt was Dialyzed yesterday  No need of HD today  2)HTN  Target Organ damage  CKD  CAD  CHF  PVD  Medication-  On Calcium Channel Blockers  On Beta blockers  3)Anemia HGb at goal (9--11)  On EPO  4)CKD Mineral-Bone Disorder  Phosphorus at goal.     On Binders Calcium at goal   5)ID-PVD+ Draining ulcer  Admitted with Wound Infection On Vanco + Zosyn Surgery and Primary MD following  6)Electrolytes   Hypokalemic     Minimally low  Normonatremic   7)Acid base  Co2 at goal    8) Afib On Amiodarone    Plan:  Will continue current care     [place] S 10/15/2013, 8:32 AM

## 2013-10-15 NOTE — Progress Notes (Signed)
Will reevaluate right below the knee amputation site tomorrow. May need revision to a right above-the-knee amputation should his stump continued to breakdown.

## 2013-10-15 NOTE — Progress Notes (Signed)
PROGRESS NOTE  [name] [name] NAT:[id number] DOB: 28-Feb-1951 DOA: 10/13/2013 PCP: [name], MD  Summary: 63 year old man hospitalized 08/2013 after traumatic wound dehiscence right BKA. He underwent primary closure per surgery. Wound culture grew multiple organisms, at that time case was discussed with infectious disease Dr. Linus Salmons was well as surgeon and cultures were felt to be insignificant and recommendation from both was to d/c abx.   He reportedly had done well at home when on the day of admission he is noted to have some drainage from the stump and referred to the emergency department. He is admitted for possible infection and wound dehiscence.  Assessment/Plan: 1. 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 Possible infection. No wound culture obtained on admission. Surgical consult suggesting breakdown secondary to ischemic changes rather than infection. Continue empiric antibiotics for now. 2. DM with PVD. Capillary blood sugars stable. Continue Lantus and sliding scale insulin. 3. ESRD on HD.  4. Atrial fibrillation on warfarin as an outpatient. Continue amiodarone, diltiazem, metoprolol, Lovenox. 5. History of CAD, CABG, ischemic cardiomyopathy left ventricular ejection fraction 40-45%, tissue mitral valve replacement.   Management of BKA wound breakdown per surgery. For now continue empiric antibiotics.  Continue management of diabetes and atrial fibrillation as above.  Code Status: full code DVT prophylaxis: Lovenox Family Communication: none present Disposition Plan: pending further evaluation  [name], MD  [facility]  Pager [phone] If 7PM-7AM, please contact night-coverage at [email or web address], password [facility] 10/15/2013, 10:59 AM  LOS: 2 days   Consultants:  General surgery  Nephrology  Procedures:    Antibiotics:  Vancomycin 2/16   Zosyn 2/16   HPI/Subjective: He feels okay today. No chest pain or shortness of breath. No nausea or  vomiting.  Objective: Filed Vitals:   10/14/13 2327 10/15/13 0500 10/15/13 0513 10/15/13 0848  BP: 126/78  110/76 115/76  Pulse: 95  96   Temp: 97.2 F (36.2 C)  97.3 F (36.3 C)   TempSrc: Oral  Oral   Resp: 18  18   Height:      Weight:  93.8 kg (206 lb 12.7 oz)    SpO2: 96%  94%     Intake/Output Summary (Last 24 hours) at 10/15/13 1059 Last data filed at 10/15/13 0900  Gross per 24 hour  Intake    720 ml  Output   2158 ml  Net  -1438 ml     Filed Weights   10/14/13 1845 10/14/13 2314 10/15/13 0500  Weight: 95.5 kg (210 lb 8.6 oz) 93.3 kg (205 lb 11 oz) 93.8 kg (206 lb 12.7 oz)    Exam:   Afebrile, vital signs are stable. No hypoxia.  Gen. Appears calm and comfortable.  Cardiovascular regular rate and rhythm. No murmur, rub or gallop.  Respiratory clear to auscultation bilaterally. No wheezes, rales or rhonchi. Normal respiratory effort.  Psychiatric grossly normal mood and affect. Speech fluent and appropriate.  Data Reviewed:  Capillary blood sugars stable.  Basic metabolic panel consistent with end-stage renal disease.  Hemoglobin stable 9.2.  Scheduled Meds: . allopurinol  100 mg Oral Daily  . amiodarone  100 mg Oral Daily  . aspirin  81 mg Oral Daily  . atorvastatin  20 mg Oral q1800  . diltiazem  180 mg Oral Daily  . enoxaparin (LOVENOX) injection  30 mg Subcutaneous Q24H  . epoetin alfa  14,000 Units Intravenous Q T,Th,Sa-HD  . folic acid  1 mg Oral Daily  . insulin aspart  0-5 Units Subcutaneous QHS  .  insulin aspart  0-9 Units Subcutaneous TID WC  . insulin glargine  10 Units Subcutaneous QHS  . metoprolol tartrate  25 mg Oral 3 times per day  . pantoprazole  40 mg Oral BID  . piperacillin-tazobactam (ZOSYN)  IV  2.25 g Intravenous 3 times per day  . sevelamer carbonate  800 mg Oral TID WC  . sodium chloride  3 mL Intravenous Q12H  . traZODone  50 mg Oral QHS  . vancomycin  1,000 mg Intravenous Q T,Th,Sa-HD   Continuous Infusions:    Principal Problem:   Wound dehiscence Active Problems:   Anemia, chronic disease   Atrial fibrillation   ESRD (end stage renal disease) on dialysis   Right BKA infection   Wound infection   Time spent 20 minutes

## 2013-10-16 DIAGNOSIS — T8131XA Disruption of external operation (surgical) wound, not elsewhere classified, initial encounter: Secondary | ICD-10-CM

## 2013-10-16 LAB — CBC
HCT: 32.3 % — ABNORMAL LOW (ref 39.0–52.0)
Hemoglobin: 9.8 g/dL — ABNORMAL LOW (ref 13.0–17.0)
MCH: 28.2 pg (ref 26.0–34.0)
MCHC: 30.3 g/dL (ref 30.0–36.0)
MCV: 93.1 fL (ref 78.0–100.0)
Platelets: 261 10*3/uL (ref 150–400)
RBC: 3.47 MIL/uL — ABNORMAL LOW (ref 4.22–5.81)
RDW: 16.9 % — ABNORMAL HIGH (ref 11.5–15.5)
WBC: 7.3 10*3/uL (ref 4.0–10.5)

## 2013-10-16 LAB — GLUCOSE, CAPILLARY
GLUCOSE-CAPILLARY: 74 mg/dL (ref 70–99)
GLUCOSE-CAPILLARY: 104 mg/dL — AB (ref 70–99)
Glucose-Capillary: 155 mg/dL — ABNORMAL HIGH (ref 70–99)
Glucose-Capillary: 117 mg/dL — ABNORMAL HIGH (ref 70–99)

## 2013-10-16 LAB — SURGICAL PCR SCREEN
MRSA, PCR: NEGATIVE
Staphylococcus aureus: NEGATIVE

## 2013-10-16 LAB — BASIC METABOLIC PANEL
BUN: 25 mg/dL — ABNORMAL HIGH (ref 6–23)
CO2: 31 mEq/L (ref 19–32)
Calcium: 10.1 mg/dL (ref 8.4–10.5)
Chloride: 99 mEq/L (ref 96–112)
Creatinine, Ser: 7.34 mg/dL — ABNORMAL HIGH (ref 0.50–1.35)
GFR calc Af Amer: 8 mL/min — ABNORMAL LOW (ref >90)
GFR calc non Af Amer: 7 mL/min — ABNORMAL LOW (ref >90)
Glucose, Bld: 73 mg/dL (ref 70–99)
Potassium: 4.1 mEq/L (ref 3.7–5.3)
Sodium: 143 mEq/L (ref 137–147)

## 2013-10-16 LAB — PHOSPHORUS: Phosphorus: 4.3 mg/dL (ref 2.3–4.6)

## 2013-10-16 LAB — PREPARE RBC (CROSSMATCH)

## 2013-10-16 MED ORDER — CHLORHEXIDINE GLUCONATE 4 % EX LIQD
1.0000 "application " | Freq: Once | CUTANEOUS | Status: AC
  Administered 2013-10-17: 1 via TOPICAL

## 2013-10-16 NOTE — Progress Notes (Addendum)
[name] [name]  MRN: [id number]  DOB/AGE: [birth year] 63 y.o.  Primary Care Physician:FUSCO,LAWRENCE J., MD  Admit date: 10/13/2013  Chief Complaint:  Chief Complaint  Patient presents with  . Drainage from Incision    S-Pt presented on  10/13/2013 with  Chief Complaint  Patient presents with  . Drainage from Incision  .    Pt offers no new complaints   Meds  . allopurinol  100 mg Oral Daily  . amiodarone  100 mg Oral Daily  . aspirin  81 mg Oral Daily  . atorvastatin  20 mg Oral q1800  . diltiazem  180 mg Oral Daily  . enoxaparin (LOVENOX) injection  30 mg Subcutaneous Q24H  . epoetin alfa  14,000 Units Intravenous Q T,Th,Sa-HD  . folic acid  1 mg Oral Daily  . insulin aspart  0-5 Units Subcutaneous QHS  . insulin aspart  0-9 Units Subcutaneous TID WC  . insulin glargine  10 Units Subcutaneous QHS  . metoprolol tartrate  25 mg Oral 3 times per day  . pantoprazole  40 mg Oral BID  . piperacillin-tazobactam (ZOSYN)  IV  2.25 g Intravenous 3 times per day  . sevelamer carbonate  800 mg Oral TID WC  . sodium chloride  3 mL Intravenous Q12H  . traZODone  50 mg Oral QHS  . vancomycin  1,000 mg Intravenous Q T,Th,Sa-HD       Physical Exam: Vital signs in last 24 hours: Temp:  [97.3 F (36.3 C)-97.8 F (36.6 C)] 97.3 F (36.3 C) (02/19 0516) Pulse Rate:  [88-106] 106 (02/19 0516) Resp:  [20] 20 (02/19 0516) BP: (100-127)/(77-89) 104/89 mmHg (02/19 0516) SpO2:  [94 %-96 %] 94 % (02/19 0516) Weight:  [206 lb 5.6 oz (93.6 kg)] 206 lb 5.6 oz (93.6 kg) (02/19 0317) Weight change: -4 lb 3 oz (-1.9 kg) Last BM Date: 10/15/13  Intake/Output from previous day: 02/18 0701 - 02/19 0700 In: 580 [P.O.:480; IV Piggyback:100] Out: -  Total I/O In: 240 [P.O.:240] Out: -    Physical Exam: General- pt is awake,alert, oriented to time place and person  Resp- No acute REsp distress, CTA B/L NO Rhonchi  CVS- S1S2 regular in rate and rhythm  GIT- BS+, soft, NT, ND  EXT- NO LE  Edema, Cyanosis, Right BKA  Access- AVF+ Bruit    Lab Results: CBC  Recent Labs  10/15/13 0549 10/16/13 0618  WBC 8.1 7.3  HGB 9.2* 9.8*  HCT 29.9* 32.3*  PLT 235 261    BMET  Recent Labs  10/15/13 0549 10/16/13 0618  NA 142 143  K 3.6* 4.1  CL 98 99  CO2 29 31  GLUCOSE 95 73  BUN 18 25*  CREATININE 5.38* 7.34*  CALCIUM 9.2 10.1     Lab Results  Component Value Date   CALCIUM 10.1 10/16/2013   PHOS 4.3 10/16/2013     Impression: 1)Renal  ESRD on HD  ON TTS schedule  Pt is to be Dialyzed today  2)HTN  Target Organ damage  CKD  CAD  CHF  PVD  Medication-  On Calcium Channel Blockers  On Beta blockers  3)Anemia HGb at goal (9--11)  On EPO  4)CKD Mineral-Bone Disorder  Phosphorus at goal.     On Binders Calcium at goal   5)ID-PVD+ Draining ulcer  Admitted with Wound Infection On Vanco + Zosyn Surgery and Primary MD following . Pt now waiting for AKA in AM  6)Electrolytes  Normokalemic Normonatremic   7)Acid  base  Co2 at goal    8) Afib On Amiodarone    Plan:  Will continue current care Will use 3 k bath   Addendum Pt seen on HD Pt tolerating tx well.  Idonia Zollinger S 10/16/2013, 10:50 AM

## 2013-10-16 NOTE — Progress Notes (Signed)
PROGRESS NOTE  [name] [name] IRC:[id number] DOB: 11/26/1950 DOA: 10/13/2013 PCP: [name], MD  Summary: 63 year old man hospitalized 08/2013 after traumatic wound dehiscence right BKA. He underwent primary closure per surgery. Wound culture grew multiple organisms, at that time case was discussed with infectious disease Dr. Linus Salmons was well as surgeon and cultures were felt to be insignificant and recommendation from both was to d/c abx.   He reportedly had done well at home when on the day of admission he is noted to have some drainage from the stump and referred to the emergency department. He is admitted for wound dehiscence secondary to ischemia and possible infection.  Assessment/Plan: 1. 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 Possible infection. No wound culture obtained on admission. Surgical consult suggesting breakdown secondary to ischemic changes rather than infection. Continue empiric antibiotics. 2. DM with PVD. Capillary blood sugars stable. Continue Lantus and sliding scale insulin. 3. ESRD on HD. Per nephrology. 4. Atrial fibrillation on warfarin as an outpatient. Continue amiodarone, diltiazem, metoprolol, Lovenox. 5. History of CAD, CABG, ischemic cardiomyopathy left ventricular ejection fraction 40-45%, tissue mitral valve replacement. Appears stable.   AKA planned 2/19 per surgery. For now continue antibiotics.  Continue management of diabetes and atrial fibrillation as above.  Code Status: full code DVT prophylaxis: Lovenox Family Communication: none present Disposition Plan: pending further evaluation  [name] Hodgkins, MD  [facility]  Pager [phone] If 7PM-7AM, please contact night-coverage at [email or web address], password [facility] 10/16/2013, 3:14 PM  LOS: 3 days   Consultants:  General surgery  Nephrology  Procedures:    Antibiotics:  Vancomycin 2/16  >>   Zosyn 2/16 >>    HPI/Subjective: No new issues. Feels okay. No  complaints.  Objective: Filed Vitals:   10/16/13 0317 10/16/13 0516 10/16/13 1352 10/16/13 1422  BP:  104/89 125/85 176/99  Pulse:  106 105 73  Temp:  97.3 F (36.3 C) 97.4 F (36.3 C) 97 F (36.1 C)  TempSrc:  Oral Oral Oral  Resp:  20 20 20   Height:      Weight: 93.6 kg (206 lb 5.6 oz)     SpO2:  94% 100% 96%    Intake/Output Summary (Last 24 hours) at 10/16/13 1514 Last data filed at 10/16/13 1245  Gross per 24 hour  Intake    680 ml  Output      0 ml  Net    680 ml     Filed Weights   10/14/13 2314 10/15/13 0500 10/16/13 0317  Weight: 93.3 kg (205 lb 11 oz) 93.8 kg (206 lb 12.7 oz) 93.6 kg (206 lb 5.6 oz)    Exam:   Afebrile, vital signs are stable. No hypoxia.  General. Appears calm and comfortable.  Respiratory clear to auscultation bilaterally. No wheezes, rales or rhonchi. Normal respiratory effort.  Cardiovascular regular rate and rhythm. No murmur, rub or gallop.  Data Reviewed:  Capillary blood sugars stable.  Basic metabolic panel consistent with end-stage renal disease.  Hemoglobin stable 9.8.  Scheduled Meds: . allopurinol  100 mg Oral Daily  . amiodarone  100 mg Oral Daily  . aspirin  81 mg Oral Daily  . atorvastatin  20 mg Oral q1800  . diltiazem  180 mg Oral Daily  . enoxaparin (LOVENOX) injection  30 mg Subcutaneous Q24H  . epoetin alfa  14,000 Units Intravenous Q T,Th,Sa-HD  . folic acid  1 mg Oral Daily  . insulin aspart  0-5 Units Subcutaneous QHS  . insulin aspart  0-9  Units Subcutaneous TID WC  . insulin glargine  10 Units Subcutaneous QHS  . metoprolol tartrate  25 mg Oral 3 times per day  . pantoprazole  40 mg Oral BID  . piperacillin-tazobactam (ZOSYN)  IV  2.25 g Intravenous 3 times per day  . sevelamer carbonate  800 mg Oral TID WC  . sodium chloride  3 mL Intravenous Q12H  . traZODone  50 mg Oral QHS  . vancomycin  1,000 mg Intravenous Q T,Th,Sa-HD   Continuous Infusions:   Principal Problem:   Wound  dehiscence Active Problems:   Anemia, chronic disease   Atrial fibrillation   ESRD (end stage renal disease) on dialysis   Right BKA infection   Wound infection   Time spent 15 minutes

## 2013-10-16 NOTE — Progress Notes (Signed)
Right below-the-knee amputation site continues to heal poorly. There is ischemic changes within the right BKA stump. We'll proceed with right above-the-knee amputation tomorrow. The risks and benefits of the procedure including bleeding, infection, and poor wound healing were fully explained to the patient, who gave informed consent.

## 2013-10-16 NOTE — Procedures (Signed)
   HEMODIALYSIS TREATMENT NOTE:  4 hour heparin-free dialysis completed via right forearm AVF (15g/antegrade).  Goal met:  Tolerated removal of 1.7 liters with no interruption in ultrafiltration.  All blood was reinfused and hemostasis was achieved within 10 minutes.  Report given to Durwin Glaze-, RN  Charlott Holler. Jari Carollo, RN, CDN

## 2013-10-17 ENCOUNTER — Encounter (HOSPITAL_COMMUNITY)
Admission: EM | Disposition: A | Discharge status: Home Health | Payer: Self-pay | Source: Home / Self Care | Attending: Family Medicine

## 2013-10-17 ENCOUNTER — Encounter (HOSPITAL_COMMUNITY): Payer: Medicare Other | Admitting: Anesthesiology

## 2013-10-17 ENCOUNTER — Encounter (HOSPITAL_COMMUNITY): Payer: Self-pay | Admitting: *Deleted

## 2013-10-17 ENCOUNTER — Inpatient Hospital Stay (HOSPITAL_COMMUNITY): Payer: Medicare Other | Admitting: Anesthesiology

## 2013-10-17 HISTORY — PX: AMPUTATION: SHX166

## 2013-10-17 LAB — GLUCOSE, CAPILLARY
GLUCOSE-CAPILLARY: 83 mg/dL (ref 70–99)
Glucose-Capillary: 64 mg/dL — ABNORMAL LOW (ref 70–99)
Glucose-Capillary: 151 mg/dL — ABNORMAL HIGH (ref 70–99)
Glucose-Capillary: 99 mg/dL (ref 70–99)
Glucose-Capillary: 135 mg/dL — ABNORMAL HIGH (ref 70–99)
Glucose-Capillary: 126 mg/dL — ABNORMAL HIGH (ref 70–99)

## 2013-10-17 LAB — CBC WITH DIFFERENTIAL/PLATELET
BASOS ABS: 0 10*3/uL (ref 0.0–0.1)
BASOS PCT: 0 % (ref 0–1)
Eosinophils Absolute: 0.4 10*3/uL (ref 0.0–0.7)
Eosinophils Relative: 5 % (ref 0–5)
HCT: 30.7 % — ABNORMAL LOW (ref 39.0–52.0)
Hemoglobin: 9.4 g/dL — ABNORMAL LOW (ref 13.0–17.0)
Lymphocytes Relative: 10 % — ABNORMAL LOW (ref 12–46)
Lymphs Abs: 0.9 10*3/uL (ref 0.7–4.0)
MCH: 28.6 pg (ref 26.0–34.0)
MCHC: 30.6 g/dL (ref 30.0–36.0)
MCV: 93.3 fL (ref 78.0–100.0)
Monocytes Absolute: 0.8 10*3/uL (ref 0.1–1.0)
Monocytes Relative: 9 % (ref 3–12)
NEUTROS PCT: 75 % (ref 43–77)
Neutro Abs: 6.4 10*3/uL (ref 1.7–7.7)
Platelets: 259 10*3/uL (ref 150–400)
RBC: 3.29 MIL/uL — AB (ref 4.22–5.81)
RDW: 16.8 % — AB (ref 11.5–15.5)
WBC: 8.5 10*3/uL (ref 4.0–10.5)

## 2013-10-17 LAB — BASIC METABOLIC PANEL
BUN: 14 mg/dL (ref 6–23)
CO2: 31 mEq/L (ref 19–32)
Calcium: 9.4 mg/dL (ref 8.4–10.5)
Chloride: 99 mEq/L (ref 96–112)
Creatinine, Ser: 5.12 mg/dL — ABNORMAL HIGH (ref 0.50–1.35)
GFR, EST AFRICAN AMERICAN: 12 mL/min — AB (ref >90)
GFR, EST NON AFRICAN AMERICAN: 11 mL/min — AB (ref >90)
Glucose, Bld: 73 mg/dL (ref 70–99)
POTASSIUM: 4.2 meq/L (ref 3.7–5.3)
Sodium: 144 mEq/L (ref 137–147)

## 2013-10-17 LAB — HEPATITIS B SURFACE ANTIGEN: Hepatitis B Surface Ag: NEGATIVE

## 2013-10-17 SURGERY — AMPUTATION, ABOVE KNEE
Anesthesia: Spinal | Site: Leg Upper | Laterality: Right

## 2013-10-17 MED ORDER — DEXTROSE 50 % IV SOLN
1.0000 | Freq: Once | INTRAVENOUS | Status: AC
  Administered 2013-10-17: 50 mL via INTRAVENOUS

## 2013-10-17 MED ORDER — SODIUM CHLORIDE BACTERIOSTATIC 0.9 % IJ SOLN
INTRAMUSCULAR | Status: AC
  Filled 2013-10-17: qty 10

## 2013-10-17 MED ORDER — DEXAMETHASONE SODIUM PHOSPHATE 4 MG/ML IJ SOLN
INTRAMUSCULAR | Status: AC
  Filled 2013-10-17: qty 1

## 2013-10-17 MED ORDER — MIDAZOLAM HCL 2 MG/2ML IJ SOLN
INTRAMUSCULAR | Status: AC
  Filled 2013-10-17: qty 2

## 2013-10-17 MED ORDER — FENTANYL CITRATE 0.05 MG/ML IJ SOLN: 25.0000 ug | INTRAMUSCULAR | Status: DC | PRN

## 2013-10-17 MED ORDER — FENTANYL CITRATE 0.05 MG/ML IJ SOLN
25.0000 ug | INTRAMUSCULAR | Status: AC
  Administered 2013-10-17 (×2): 25 ug via INTRAVENOUS
  Filled 2013-10-17: qty 2

## 2013-10-17 MED ORDER — BUPIVACAINE IN DEXTROSE 0.75-8.25 % IT SOLN
INTRATHECAL | Status: AC
  Filled 2013-10-17: qty 2

## 2013-10-17 MED ORDER — SODIUM CHLORIDE 0.9 % IV SOLN
INTRAVENOUS | Status: DC
  Administered 2013-10-17: 1000 mL via INTRAVENOUS

## 2013-10-17 MED ORDER — FENTANYL CITRATE 0.05 MG/ML IJ SOLN
50.0000 ug | INTRAMUSCULAR | Status: DC | PRN
  Administered 2013-10-17 – 2013-10-18 (×8): 50 ug via INTRAVENOUS
  Administered 2013-10-18: 03:00:00 via INTRAVENOUS
  Filled 2013-10-17 (×9): qty 2

## 2013-10-17 MED ORDER — MIDAZOLAM HCL 2 MG/2ML IJ SOLN
1.0000 mg | INTRAMUSCULAR | Status: DC | PRN
  Administered 2013-10-17: 2 mg via INTRAVENOUS

## 2013-10-17 MED ORDER — PHENYLEPHRINE HCL 10 MG/ML IJ SOLN
INTRAMUSCULAR | Status: AC
  Filled 2013-10-17: qty 1

## 2013-10-17 MED ORDER — POVIDONE-IODINE 10 % OINT PACKET
TOPICAL_OINTMENT | CUTANEOUS | Status: DC | PRN
  Administered 2013-10-17: 3 via TOPICAL

## 2013-10-17 MED ORDER — PHENYLEPHRINE HCL 10 MG/ML IJ SOLN
INTRAMUSCULAR | Status: DC | PRN
  Administered 2013-10-17: 50 ug via INTRAVENOUS
  Administered 2013-10-17 (×2): 100 ug via INTRAVENOUS
  Administered 2013-10-17: 50 ug via INTRAVENOUS
  Administered 2013-10-17 (×2): 100 ug via INTRAVENOUS

## 2013-10-17 MED ORDER — POVIDONE-IODINE 10 % EX OINT
TOPICAL_OINTMENT | CUTANEOUS | Status: AC
  Filled 2013-10-17: qty 3

## 2013-10-17 MED ORDER — PROPOFOL INFUSION 10 MG/ML OPTIME
INTRAVENOUS | Status: DC | PRN
  Administered 2013-10-17: 50 ug/kg/min via INTRAVENOUS

## 2013-10-17 MED ORDER — LIDOCAINE HCL (PF) 1 % IJ SOLN
INTRAMUSCULAR | Status: AC
  Filled 2013-10-17: qty 5

## 2013-10-17 MED ORDER — LACTATED RINGERS IV SOLN
INTRAVENOUS | Status: DC
  Administered 2013-10-17 – 2013-10-18 (×2): via INTRAVENOUS

## 2013-10-17 MED ORDER — LIDOCAINE HCL (CARDIAC) 10 MG/ML IV SOLN
INTRAVENOUS | Status: DC | PRN
  Administered 2013-10-17: 10 mg via INTRAVENOUS

## 2013-10-17 MED ORDER — PROPOFOL 10 MG/ML IV BOLUS
INTRAVENOUS | Status: AC
  Filled 2013-10-17: qty 20

## 2013-10-17 MED ORDER — SODIUM CHLORIDE 0.9 % IR SOLN
Status: DC | PRN
  Administered 2013-10-17: 1000 mL

## 2013-10-17 MED ORDER — BUPIVACAINE IN DEXTROSE 0.75-8.25 % IT SOLN
INTRATHECAL | Status: DC | PRN
  Administered 2013-10-17: 15 mg via INTRATHECAL

## 2013-10-17 MED ORDER — ONDANSETRON HCL 4 MG/2ML IJ SOLN: 4.0000 mg | Freq: Once | INTRAMUSCULAR | Status: DC | PRN

## 2013-10-17 MED ORDER — AMIODARONE HCL 200 MG PO TABS
100.0000 mg | ORAL_TABLET | Freq: Once | ORAL | Status: AC
  Administered 2013-10-17: 100 mg via ORAL
  Filled 2013-10-17: qty 1

## 2013-10-17 MED ORDER — DEXTROSE 50 % IV SOLN: 12.5000 g | Freq: Once | INTRAVENOUS | Status: DC

## 2013-10-17 MED ORDER — FENTANYL CITRATE 0.05 MG/ML IJ SOLN
INTRAMUSCULAR | Status: AC
  Filled 2013-10-17: qty 2

## 2013-10-17 MED ORDER — DEXTROSE 50 % IV SOLN
INTRAVENOUS | Status: AC
  Filled 2013-10-17: qty 50

## 2013-10-17 MED ORDER — FENTANYL CITRATE 0.05 MG/ML IJ SOLN
INTRAMUSCULAR | Status: DC | PRN
  Administered 2013-10-17: 12.5 ug via INTRATHECAL

## 2013-10-17 SURGICAL SUPPLY — 42 items
BAG HAMPER (MISCELLANEOUS) ×3 IMPLANT
BANDAGE ELASTIC 6 VELCRO NS (GAUZE/BANDAGES/DRESSINGS) ×3 IMPLANT
BLADE SAW RECIP 87.9 MT (BLADE) ×3 IMPLANT
BLADE SURG SZ10 CARB STEEL (BLADE) ×6 IMPLANT
BLADE SURG SZ20 CARB STEEL (BLADE) IMPLANT
BNDG COHESIVE 4X5 TAN STRL (GAUZE/BANDAGES/DRESSINGS) ×3 IMPLANT
CLOTH BEACON ORANGE TIMEOUT ST (SAFETY) ×3 IMPLANT
COVER LIGHT HANDLE STERIS (MISCELLANEOUS) ×6 IMPLANT
ELECT REM PT RETURN 9FT ADLT (ELECTROSURGICAL) ×3
ELECTRODE REM PT RTRN 9FT ADLT (ELECTROSURGICAL) ×1 IMPLANT
GLOVE BIO SURGEON STRL SZ7.5 (GLOVE) ×3 IMPLANT
GLOVE BIOGEL PI IND STRL 7.0 (GLOVE) ×2 IMPLANT
GLOVE BIOGEL PI IND STRL 8 (GLOVE) ×1 IMPLANT
GLOVE BIOGEL PI IND STRL 8.5 (GLOVE) ×1 IMPLANT
GLOVE BIOGEL PI INDICATOR 7.0 (GLOVE) ×4
GLOVE BIOGEL PI INDICATOR 8 (GLOVE) ×2
GLOVE BIOGEL PI INDICATOR 8.5 (GLOVE) ×2
GLOVE ECLIPSE 7.0 STRL STRAW (GLOVE) ×3 IMPLANT
GLOVE ECLIPSE 8.0 STRL XLNG CF (GLOVE) ×6 IMPLANT
GLOVE EXAM NITRILE MD LF STRL (GLOVE) ×3 IMPLANT
GLOVE SS BIOGEL STRL SZ 6.5 (GLOVE) ×1 IMPLANT
GLOVE SUPERSENSE BIOGEL SZ 6.5 (GLOVE) ×2
GOWN STRL REUS W/TWL LRG LVL3 (GOWN DISPOSABLE) ×9 IMPLANT
INST SET MAJOR BONE (KITS) ×3 IMPLANT
KIT ROOM TURNOVER APOR (KITS) ×3 IMPLANT
MANIFOLD NEPTUNE II (INSTRUMENTS) ×3 IMPLANT
NS IRRIG 1000ML POUR BTL (IV SOLUTION) ×3 IMPLANT
PACK BASIC LIMB (CUSTOM PROCEDURE TRAY) ×3 IMPLANT
PAD ABD 5X9 TENDERSORB (GAUZE/BANDAGES/DRESSINGS) ×6 IMPLANT
PAD ARMBOARD 7.5X6 YLW CONV (MISCELLANEOUS) ×3 IMPLANT
SET BASIN LINEN APH (SET/KITS/TRAYS/PACK) ×3 IMPLANT
SOL PREP PROV IODINE SCRUB 4OZ (MISCELLANEOUS) ×3 IMPLANT
SPONGE GAUZE 4X4 12PLY (GAUZE/BANDAGES/DRESSINGS) ×3 IMPLANT
SPONGE LAP 18X18 X RAY DECT (DISPOSABLE) ×6 IMPLANT
STAPLER VISISTAT 35W (STAPLE) ×3 IMPLANT
SUT BONE WAX W31G (SUTURE) IMPLANT
SUT SILK 0 FSL (SUTURE) ×21 IMPLANT
SUT SILK 2 0 REEL (SUTURE) ×3 IMPLANT
SUT SILK 2 0 SH (SUTURE) IMPLANT
SUT VIC AB 2-0 CT1 27 (SUTURE) ×16
SUT VIC AB 2-0 CT1 TAPERPNT 27 (SUTURE) ×8 IMPLANT
YANKAUER SUCT BULB TIP NO VENT (SUCTIONS) ×3 IMPLANT

## 2013-10-17 NOTE — Progress Notes (Signed)
[name] [name]  MRN: [id number]  DOB/AGE: [birth year] 65 y.o.  Primary Care Physician:[name] J., MD  Admit date: 10/13/2013  Chief Complaint:  Chief Complaint  Patient presents with  . Drainage from Incision    S-Pt presented on  10/13/2013 with  Chief Complaint  Patient presents with  . Drainage from Incision  .    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    Pt offers no c/o chest pain/ Dyspnea.      Pt says " I am fine. No Pain"   Meds  . allopurinol  100 mg Oral Daily  . amiodarone  100 mg Oral Daily  . aspirin  81 mg Oral Daily  . atorvastatin  20 mg Oral q1800  . diltiazem  180 mg Oral Daily  . enoxaparin (LOVENOX) injection  30 mg Subcutaneous Q24H  . epoetin alfa  14,000 Units Intravenous Q T,Th,Sa-HD  . folic acid  1 mg Oral Daily  . insulin aspart  0-5 Units Subcutaneous QHS  . insulin aspart  0-9 Units Subcutaneous TID WC  . insulin glargine  10 Units Subcutaneous QHS  . metoprolol tartrate  25 mg Oral 3 times per day  . pantoprazole  40 mg Oral BID  . piperacillin-tazobactam (ZOSYN)  IV  2.25 g Intravenous 3 times per day  . sevelamer carbonate  800 mg Oral TID WC  . sodium chloride  3 mL Intravenous Q12H  . traZODone  50 mg Oral QHS  . vancomycin  1,000 mg Intravenous Q T,Th,Sa-HD       Physical Exam: Vital signs in last 24 hours: Temp:  [97 F (36.1 C)-98.3 F (36.8 C)] 98.3 F (36.8 C) (02/20 0520) Pulse Rate:  [73-105] 102 (02/20 0520) Resp:  [16-20] 18 (02/20 0520) BP: (101-176)/(60-99) 115/72 mmHg (02/20 0520) SpO2:  [92 %-100 %] 100 % (02/20 0520) Weight:  [202 lb 6.1 oz (91.8 kg)] 202 lb 6.1 oz (91.8 kg) (02/20 0520) Weight change: -3 lb 15.5 oz (-1.8 kg) Last BM Date: 10/16/13  Intake/Output from previous day: 02/19 0701 - 02/20 0700 In: 580 [P.O.:480; IV Piggyback:100] Out: 1700  Total I/O In: -  Out: 1700 [Other:1700]   Physical Exam: General- pt is awake,alert, oriented to time place and person  Resp- No acute REsp  distress, CTA B/L NO Rhonchi  CVS- S1S2 regular in rate and rhythm  GIT- BS+, soft, NT, ND  EXT- NO LE Edema, Cyanosis, Right BKA  Access- AVF+ Bruit    Lab Results: CBC  Recent Labs  10/15/13 0549 10/16/13 0618  WBC 8.1 7.3  HGB 9.2* 9.8*  HCT 29.9* 32.3*  PLT 235 261    BMET  Recent Labs  10/15/13 0549 10/16/13 0618  NA 142 143  K 3.6* 4.1  CL 98 99  CO2 29 31  GLUCOSE 95 73  BUN 18 25*  CREATININE 5.38* 7.34*  CALCIUM 9.2 10.1    MICRO Recent Results (from the past 240 hour(s))  SURGICAL PCR SCREEN     Status: None   Collection Time    10/16/13  2:00 PM      Result Value Ref Range Status   MRSA, PCR NEGATIVE  NEGATIVE Final   Staphylococcus aureus NEGATIVE  NEGATIVE Final   Comment:            The Xpert SA Assay (FDA     approved for NASAL specimens     in patients over 65 years of age),     is  one component of     a comprehensive surveillance     program.  Test performance has     been validated by [facility] for patients greater     than or equal to 30 year old.     It is not intended     to diagnose infection nor to     guide or monitor treatment.      Lab Results  Component Value Date   CALCIUM 10.1 10/16/2013   PHOS 4.3 10/16/2013     Impression: 1)Renal  ESRD on HD  ON TTS schedule  Pt was Dialyzed yesterday  No need of HD today  2)CVS- Hypotensive  Medication-  On Calcium Channel Blockers  On Beta blockers  3)Anemia HGb at goal (9--11)  On EPO  4)CKD Mineral-Bone Disorder  Phosphorus at goal.     On Binders Calcium at goal   5)ID-PVD+ Draining ulcer  Admitted with Wound Infection On Vanco + Zosyn Surgery and Primary MD following  Pt has BKA revision today  6)Electrolytes  Was Hypokalemic now better Normonatremic   7)Acid base  Co2 at goal    9) Afib On amiodarone   Plan:  Will Dialyze in am  Will suggest to hold calcium blockers as hypotensive.    [name] S 10/17/2013, 6:28 AM

## 2013-10-17 NOTE — Progress Notes (Signed)
250 ml of saline bolus completed. Saline infusing at 62ml/hr. Tolerated well. Diet ginger-ale given to drink. Tolerated well.

## 2013-10-17 NOTE — Progress Notes (Signed)
Dr Patsey Berthold at bedside to check pt re: hypotension. Order given for 250 ml of normal saline IV bolus. Saline bolus started.

## 2013-10-17 NOTE — Anesthesia Preprocedure Evaluation (Addendum)
Anesthesia Evaluation  Patient identified by MRN, date of birth, ID band Patient awake    Reviewed: Allergy & Precautions, H&P , NPO status , Patient's Chart, lab work & pertinent test results, reviewed documented beta blocker date and time   Airway Mallampati: II TM Distance: >3 FB Neck ROM: Full    Dental  (+) Partial Upper, Partial Lower   Pulmonary former smoker,  breath sounds clear to auscultation        Cardiovascular hypertension, + CAD, + Past MI, + CABG, + Peripheral Vascular Disease and +CHF + dysrhythmias Atrial Fibrillation + Valvular Problems/Murmurs (MVR) MR Rhythm:Irregular Rate:Normal     Neuro/Psych    GI/Hepatic GERD-  Controlled and Medicated,  Endo/Other  diabetes, Type 2  Renal/GU ESRFRenal disease     Musculoskeletal   Abdominal   Peds  Hematology  (+) Blood dyscrasia, anemia ,   Anesthesia Other Findings   Reproductive/Obstetrics                          Anesthesia Physical Anesthesia Plan  ASA: IV  Anesthesia Plan: Spinal   Post-op Pain Management:    Induction: Intravenous  Airway Management Planned: Simple Face Mask  Additional Equipment:   Intra-op Plan:   Post-operative Plan:   Informed Consent: I have reviewed the patients History and Physical, chart, labs and discussed the procedure including the risks, benefits and alternatives for the proposed anesthesia with the patient or authorized representative who has indicated his/her understanding and acceptance.     Plan Discussed with:   Anesthesia Plan Comments:        Anesthesia Quick Evaluation

## 2013-10-17 NOTE — Anesthesia Postprocedure Evaluation (Signed)
Anesthesia Post Note  Patient: [name] [name]  Procedure(s) Performed: Procedure(s) (LRB): AMPUTATION ABOVE KNEE (Right)  Anesthesia type: Spinal  Patient location: PACU  Post pain: Pain level controlled  Post assessment: Post-op Vital signs reviewed, Patient's Cardiovascular Status Stable, Respiratory Function Stable, Patent Airway, No signs of Nausea or vomiting and Pain level controlled   Post vital signs: Reviewed and stable  Level of consciousness: awake and alert   Complications: No apparent anesthesia complications

## 2013-10-17 NOTE — Anesthesia Procedure Notes (Addendum)
Procedure Name: MAC Date/Time: 10/17/2013 8:58 AM Performed by: Vista Deck Pre-anesthesia Checklist: Patient identified, Emergency Drugs available, Suction available, Timeout performed and Patient being monitored Patient Re-evaluated:Patient Re-evaluated prior to inductionOxygen Delivery Method: Nasal Cannula    Spinal  Patient location during procedure: OR Start time: 10/17/2013 9:07 AM End time: 10/17/2013 9:11 AM Staffing CRNA/Resident: Drucie Opitz S Preanesthetic Checklist Completed: patient identified, site marked, surgical consent, pre-op evaluation, timeout performed, IV checked, risks and benefits discussed and monitors and equipment checked Spinal Block Patient position: right lateral decubitus Prep: Betadine Patient monitoring: heart rate, cardiac monitor, continuous pulse ox and blood pressure Approach: right paramedian Location: L3-4 Injection technique: single-shot Needle Needle type: Spinocan  Needle gauge: 22 G Needle length: 9 cm Assessment Sensory level: T8 Additional Notes Betadine prep x 3 1% lidocaine skin wheal 1 cc Clear CSF pre and post injection   ATTEMPTS: 1 TRAY ID: [id number] TRAY EXPIRATION DATE: 2016-01  Procedure Name: MAC Date/Time: 10/17/2013 9:40 AM Performed by: Vista Deck Pre-anesthesia Checklist: Patient identified, Emergency Drugs available, Suction available, Timeout performed and Patient being monitored Patient Re-evaluated:Patient Re-evaluated prior to inductionOxygen Delivery Method: Non-rebreather mask

## 2013-10-17 NOTE — Transfer of Care (Addendum)
Immediate Anesthesia Transfer of Care Note  Patient: [name] [name]  Procedure(s) Performed: Procedure(s) (LRB): AMPUTATION ABOVE KNEE (Right)  Patient Location: PACU  Anesthesia Type: SAB  Level of Consciousness: awake  Airway & Oxygen Therapy: Patient Spontanous Breathing and non-rebreather face mask  Post-op Assessment: Report given to PACU RN, Post -op Vital signs reviewed and stable. SAB Level  L1  Post vital signs: Reviewed and stable  Complications: No apparent anesthesia complications

## 2013-10-17 NOTE — Progress Notes (Signed)
PROGRESS NOTE  [name] [name] PJA:[id number] DOB: [birth year] DOA: 10/13/2013 PCP: [name], MD  Summary: 63 year old man presented with wound dehiscence, noted to have some drainage from the stump and referred to the emergency department. He is admitted for wound dehiscence secondary to ischemia and possible secondary infection.  Assessment/Plan: 1. 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 Possible secondary infection. No wound culture obtained on admission. Surgical consult suggesting breakdown secondary to ischemic changes rather than infection. Continue empiric antibiotics. 2. DM with PVD. Capillary blood sugars overall stable, 1 low this morning, continue to monitor. Continue Lantus and sliding scale insulin. 3. ESRD on HD. Per nephrology. 4. Atrial fibrillation on warfarin as an outpatient. Rate modestly elevated. Amiodarone and diltiazem were held this morning for surgery. Continue amiodarone, metoprolol, Lovenox, diltiazem. 5. History of CAD, CABG, ischemic cardiomyopathy left ventricular ejection fraction 40-45%, tissue mitral valve replacement. Appears stable.   Continue empiric antibiotics.  Resume anticoagulation when okay with surgery.  Resume amiodarone.  Had hypotensive episode perioperatively, now normotensive again. Rate control has been difficult in the past. Unless hypotension recurs will continue metoprolol, diltiazem and amiodarone which she has tolerated thus far this admission.  Code Status: full code DVT prophylaxis: SCDs Family Communication: none present Disposition Plan: pending further evaluation  [name], MD  [facility]  Pager [phone] If 7PM-7AM, please contact night-coverage at [email or web address], password [facility] 10/17/2013, 5:04 PM  LOS: 4 days   Consultants:  General surgery  Nephrology  Procedures:  2/20: Right above-the-knee amputation  Antibiotics:  Vancomycin 2/16  >>   Zosyn 2/16 >>    HPI/Subjective: Status  post surgery. He feels okay now.  Objective: Filed Vitals:   10/17/13 1145 10/17/13 1231 10/17/13 1330 10/17/13 1620  BP: 95/57 101/72 101/77 123/82  Pulse: 89 90  112  Temp:  97.3 F (36.3 C) 97.3 F (36.3 C) 98.4 F (36.9 C)  TempSrc:  Oral Oral Oral  Resp: 21 20 20 20   Height:      Weight:      SpO2: 97% 95% 99% 97%    Intake/Output Summary (Last 24 hours) at 10/17/13 1704 Last data filed at 10/17/13 1420  Gross per 24 hour  Intake    720 ml  Output   1775 ml  Net  -1055 ml     Filed Weights   10/16/13 0317 10/17/13 0520 10/17/13 0815  Weight: 93.6 kg (206 lb 5.6 oz) 91.8 kg (202 lb 6.1 oz) 91.627 kg (202 lb)    Exam:   Afebrile, vital signs are stable. No hypoxia.  General. Appears comfortable, calm. Speech fluent and clear.  Cardiovascular regular rate and rhythm. No murmur, rub or gallop.  Respiratory clear to auscultation bilaterally. No wheezes, rales or rhonchi. Normal respiratory effort.  Psychiatric grossly normal mood and affect. Speech fluent and appropriate.  Data Reviewed:  Capillary blood sugars reviewed. One episode of modest hypoglycemia preoperatively.  Hemoglobin stable 9.4.  Potassium normal.  Scheduled Meds: . allopurinol  100 mg Oral Daily  . amiodarone  100 mg Oral Daily  . aspirin  81 mg Oral Daily  . atorvastatin  20 mg Oral q1800  . diltiazem  180 mg Oral Daily  . epoetin alfa  14,000 Units Intravenous Q T,Th,Sa-HD  . folic acid  1 mg Oral Daily  . insulin aspart  0-5 Units Subcutaneous QHS  . insulin aspart  0-9 Units Subcutaneous TID WC  . insulin glargine  10 Units Subcutaneous QHS  . metoprolol tartrate  25 mg Oral 3 times per day  . pantoprazole  40 mg Oral BID  . piperacillin-tazobactam (ZOSYN)  IV  2.25 g Intravenous 3 times per day  . sevelamer carbonate  800 mg Oral TID WC  . sodium chloride  3 mL Intravenous Q12H  . traZODone  50 mg Oral QHS  . vancomycin  1,000 mg Intravenous Q T,Th,Sa-HD   Continuous  Infusions: . lactated ringers 75 mL/hr at 10/17/13 1259    Principal Problem:   Wound dehiscence Active Problems:   Anemia, chronic disease   Atrial fibrillation   ESRD (end stage renal disease) on dialysis   Right BKA infection   Wound infection   Time spent 15 minutes

## 2013-10-17 NOTE — Op Note (Signed)
Patient:  [name] [name]  DOB:  [birth year]  MRN:  [id number]   Preop Diagnosis:  Wound breakdown and gangrene, right lower extremity, status post right below the knee amputation revision  Postop Diagnosis:  Same  Procedure:  Right above-the-knee amputation  Surgeon:  Aviva Signs, M.D.  Anes:  Spinal  Indications:  Patient is a 63 year old black male with multiple medical problems including peripheral vascular disease, diabetes mellitus, end-stage renal disease who was status post a right below the knee amputation in December 2014. When he was discharged from the rehabilitation center, he fell on the right below-the-knee amputation site, causing a dehiscence. This was revised but has since become ischemic and broken down. He now presents for a right above-the-knee amputation. The risks and benefits of the procedure including bleeding, infection, or possible need for blood transfusion, and wound healing issues were fully explained to the patient, who gave informed consent.  Procedure note:  The patient is placed the supine position. After spinal anesthesia was a minister, the right lower extremity was prepped and draped using the usual sterile technique with Betadine. Surgical site confirmation was performed.  A fishmouth incision was made just above the right knee. The soft tissue muscles were divided using Bovie electrocautery. Any major blood vessels were suture ligated using 0 silk suture ligatures. The femur was divided in an angular way using a saw. The posterior soft tissue was then divided using Bovie electrocautery. The right lower extremity was then removed and sent to pathology further examination. The distal femur bone was smoothed using a last. Bone wax was then applied to the distal femur opening. The surrounding muscle and fascia was reapproximated over the distal femur using a figure-of-eight 0 silk suture. The anterior and posterior fascial edges were reapproximated using 2-0 Vicryl  interrupted sutures. The skin was closed using staples. Betadine ointment and a dry sterile dressing were applied along with an Ace wrap.  All tape and needle counts were correct at the end of the procedure. Patient was transferred to PACU in stable condition.    Complications:  None  EBL:  75 cc  Specimen:  Right lower extremity

## 2013-10-17 NOTE — Progress Notes (Signed)
Patient recently admitted less than one month ago and had received  an Incentive Spirometer(IS). He stated a family member would bring it to him either this evening or tomorrow. RT reviewed the need for deep breathing and cough every two hours post his procedure. The patient was told if he was unable to have IS brought to him by mid-morning tomorrow one would be given to him and it use reviewed.

## 2013-10-18 DIAGNOSIS — Z7901 Long term (current) use of anticoagulants: Secondary | ICD-10-CM

## 2013-10-18 LAB — BASIC METABOLIC PANEL
BUN: 22 mg/dL (ref 6–23)
CO2: 28 meq/L (ref 19–32)
CREATININE: 6.88 mg/dL — AB (ref 0.50–1.35)
Calcium: 10 mg/dL (ref 8.4–10.5)
Chloride: 97 mEq/L (ref 96–112)
GFR calc non Af Amer: 8 mL/min — ABNORMAL LOW (ref >90)
GFR, EST AFRICAN AMERICAN: 9 mL/min — AB (ref >90)
Glucose, Bld: 123 mg/dL — ABNORMAL HIGH (ref 70–99)
Potassium: 4.6 mEq/L (ref 3.7–5.3)
Sodium: 141 mEq/L (ref 137–147)

## 2013-10-18 LAB — GLUCOSE, CAPILLARY
GLUCOSE-CAPILLARY: 117 mg/dL — AB (ref 70–99)
Glucose-Capillary: 105 mg/dL — ABNORMAL HIGH (ref 70–99)
Glucose-Capillary: 109 mg/dL — ABNORMAL HIGH (ref 70–99)
Glucose-Capillary: 97 mg/dL (ref 70–99)

## 2013-10-18 LAB — CBC
HCT: 31.1 % — ABNORMAL LOW (ref 39.0–52.0)
Hemoglobin: 9.3 g/dL — ABNORMAL LOW (ref 13.0–17.0)
MCH: 28.2 pg (ref 26.0–34.0)
MCHC: 29.9 g/dL — ABNORMAL LOW (ref 30.0–36.0)
MCV: 94.2 fL (ref 78.0–100.0)
Platelets: 287 10*3/uL (ref 150–400)
RBC: 3.3 MIL/uL — ABNORMAL LOW (ref 4.22–5.81)
RDW: 17 % — ABNORMAL HIGH (ref 11.5–15.5)
WBC: 12.2 10*3/uL — AB (ref 4.0–10.5)

## 2013-10-18 MED ORDER — WARFARIN - PHARMACIST DOSING INPATIENT: Status: DC

## 2013-10-18 MED ORDER — ENOXAPARIN SODIUM 30 MG/0.3ML ~~LOC~~ SOLN
30.0000 mg | SUBCUTANEOUS | Status: DC
  Administered 2013-10-18 – 2013-10-20 (×3): 30 mg via SUBCUTANEOUS
  Filled 2013-10-18 (×3): qty 0.3

## 2013-10-18 MED ORDER — WARFARIN SODIUM 2 MG PO TABS
2.0000 mg | ORAL_TABLET | Freq: Once | ORAL | Status: AC
  Administered 2013-10-18: 2 mg via ORAL
  Filled 2013-10-18: qty 1

## 2013-10-18 NOTE — Progress Notes (Signed)
ANTIBIOTIC CONSULT NOTE - follow up  Pharmacy Consult for Vancomycin & Zosyn Indication: Wound infection  Allergies  Allergen Reactions  . Bacitracin Other (See Comments)    unknown  . Norvasc [Amlodipine Besylate] Other (See Comments)    unknown   Patient Measurements: Height: 6\' 2"  (188 cm) Weight: 199 lb 11.8 oz (90.6 kg) IBW/kg (Calculated) : 82.2  Vital Signs: Temp: 97.6 F (36.4 C) (02/21 1027) Temp src: Oral (02/21 1027) BP: 140/80 mmHg (02/21 1027) Pulse Rate: 102 (02/21 1027) Intake/Output from previous day: 02/20 0701 - 02/21 0700 In: 950 [P.O.:350; I.V.:600] Out: 75 [Blood:75] Intake/Output from this shift:    Labs:  Recent Labs  10/16/13 0618 10/17/13 0658 10/18/13 0459  WBC 7.3 8.5 12.2*  HGB 9.8* 9.4* 9.3*  PLT 261 259 287  CREATININE 7.34* 5.12* 6.88*   Estimated Creatinine Clearance: 12.6 ml/min (by C-G formula based on Cr of 6.88). No results found for this basename: VANCOTROUGH, Corlis Leak, VANCORANDOM, GENTTROUGH, GENTPEAK, GENTRANDOM, TOBRATROUGH, TOBRAPEAK, TOBRARND, AMIKACINPEAK, AMIKACINTROU, AMIKACIN,  in the last 72 hours   Microbiology: Recent Results (from the past 720 hour(s))  SURGICAL PCR SCREEN     Status: None   Collection Time    10/16/13  2:00 PM      Result Value Ref Range Status   MRSA, PCR NEGATIVE  NEGATIVE Final   Staphylococcus aureus NEGATIVE  NEGATIVE Final   Comment:            The Xpert SA Assay (FDA     approved for NASAL specimens     in patients over 91 years of age),     is one component of     a comprehensive surveillance     program.  Test performance has     been validated by [employer] for patients greater     than or equal to 14 year old.     It is not intended     to diagnose infection nor to     guide or monitor treatment.   Medical History: Past Medical History  Diagnosis Date  . UGI bleed 02/12/11    on anticoagulation; EGD w/snare polypectomy-multiple polypoid lesions antrum(benign),  Chronic active gastritis (NEGATIVE H pylori)  . Anemia, chronic disease   . Sepsis due to enterococcus 02/11/11  . Atrial fibrillation     coumadin  . CAD (coronary artery disease)     s/p CABGx4  . Cardiomyopathy     40-45% EF 6/12  . Diabetes mellitus   . Hypertension   . Hyperlipemia   . Gout   . S/P patent foramen ovale closure   . History of nuclear stress test 11/2011    negative myoview  . End stage kidney disease     T/TH/SAT dialyis, Dr Hinda Lenis  . Gout   . History of nuclear stress test 07/04/2010    dipyridamole; mod fixed inferolateral defect; non-diagnostic for ischemia; low risk scan   . Hyperbilirubinemia 08/10/2013   Medications:  Scheduled:  . allopurinol  100 mg Oral Daily  . amiodarone  100 mg Oral Daily  . aspirin  81 mg Oral Daily  . atorvastatin  20 mg Oral q1800  . diltiazem  180 mg Oral Daily  . epoetin alfa  14,000 Units Intravenous Q T,Th,Sa-HD  . folic acid  1 mg Oral Daily  . insulin aspart  0-5 Units Subcutaneous QHS  . insulin aspart  0-9 Units Subcutaneous TID WC  . insulin glargine  10 Units Subcutaneous QHS  . metoprolol tartrate  25 mg Oral 3 times per day  . pantoprazole  40 mg Oral BID  . piperacillin-tazobactam (ZOSYN)  IV  2.25 g Intravenous 3 times per day  . sevelamer carbonate  800 mg Oral TID WC  . sodium chloride  3 mL Intravenous Q12H  . traZODone  50 mg Oral QHS  . vancomycin  1,000 mg Intravenous Q T,Th,Sa-HD   Assessment: Admitted with Below the knee amputation stump wound infection Dialysis patient & schedule is Tue-Thur-Sat Surgery for above the knee amputation. No change in dialysis schedule  Goal of Therapy:  Pre-Hemodialysis Vancomycin level goal range =15-25 mcg/ml  Plan:  Vancomycin 1000 mg IV after each dialysis session. Due today Check pre-dialysis level when warranted Zosyn 2.25 GM IV every 8 hours Labs per protocol, f/u cultures  Abner Greenspan, Analena Gama Bennett 10/18/2013,10:55 AM

## 2013-10-18 NOTE — Progress Notes (Signed)
1 Day Post-Op  Subjective: Minimal incisional pain, well controlled with current pain medication regimen.  Objective: Vital signs in last 24 hours: Temp:  [97.3 F (36.3 C)-99.6 F (37.6 C)] 97.6 F (36.4 C) (02/21 1027) Pulse Rate:  [90-117] 102 (02/21 1027) Resp:  [20] 20 (02/21 1027) BP: (101-164)/(68-91) 140/80 mmHg (02/21 1027) SpO2:  [92 %-99 %] 96 % (02/21 1027) Weight:  [90.6 kg (199 lb 11.8 oz)] 90.6 kg (199 lb 11.8 oz) (02/21 0421) Last BM Date: 10/16/13  Intake/Output from previous day: 02/20 0701 - 02/21 0700 In: 950 [P.O.:350; I.V.:600] Out: 75 [Blood:75] Intake/Output this shift:    General appearance: alert, cooperative and no distress Extremities: Right above-the-knee amputation site healing well without significant hematoma. Ace wrap was replaced as it was missing.  Lab Results:   Recent Labs  10/17/13 0658 10/18/13 0459  WBC 8.5 12.2*  HGB 9.4* 9.3*  HCT 30.7* 31.1*  PLT 259 287   BMET  Recent Labs  10/17/13 0658 10/18/13 0459  NA 144 141  K 4.2 4.6  CL 99 97  CO2 31 28  GLUCOSE 73 123*  BUN 14 22  CREATININE 5.12* 6.88*  CALCIUM 9.4 10.0   PT/INR No results found for this basename: LABPROT, INR,  in the last 72 hours  Studies/Results: No results found.  Anti-infectives: Anti-infectives   Start     Dose/Rate Route Frequency Ordered Stop   10/14/13 1200  vancomycin (VANCOCIN) IVPB 1000 mg/200 mL premix     1,000 mg 200 mL/hr over 60 Minutes Intravenous Every T-Th-Sa (Hemodialysis) 10/14/13 1115     10/13/13 2200  vancomycin (VANCOCIN) 1,500 mg in sodium chloride 0.9 % 500 mL IVPB     1,500 mg 250 mL/hr over 120 Minutes Intravenous  Once 10/13/13 2048 10/14/13 0000   10/13/13 2200  piperacillin-tazobactam (ZOSYN) IVPB 2.25 g     2.25 g 100 mL/hr over 30 Minutes Intravenous 3 times per day 10/13/13 2048        Assessment/Plan: s/p Procedure(s): AMPUTATION ABOVE KNEE Impression: Stable on postoperative day 1, status post right  above-the-knee amputation. Hemoglobin stable. Mild elevation of white blood cell count not unexpected. Incision was in an area of no infection. May restart Coumadin. Okay for transfer to rehabilitation center from the surgical standpoint.  LOS: 5 days    Marixa Mellott A 10/18/2013

## 2013-10-18 NOTE — Procedures (Signed)
   HEMODIALYSIS TREATMENT NOTE:  3.5 hour heparin-free dialysis completed via right forearm AVF (15g/antegrade).  Goal met:  Tolerated removal of 2 liters with no interruption in ultrafiltration.  All blood was reinfused and hemostasis was achieved within 10 minutes. Report given to Bree, RN.  Angela L. Poteat, RN, CDN 

## 2013-10-18 NOTE — Progress Notes (Signed)
ANTICOAGULATION CONSULT NOTE - Initial Consult  Pharmacy Consult for Warfarin Indication: atrial fibrillation  Allergies  Allergen Reactions  . Bacitracin Other (See Comments)    unknown  . Norvasc [Amlodipine Besylate] Other (See Comments)    unknown    Patient Measurements: Height: 6\' 2"  (188 cm) Weight: 199 lb 11.8 oz (90.6 kg) IBW/kg (Calculated) : 82.2 Heparin Dosing Weight:   Vital Signs: Temp: 97.6 F (36.4 C) (02/21 1027) Temp src: Oral (02/21 1027) BP: 140/80 mmHg (02/21 1027) Pulse Rate: 102 (02/21 1027)  Labs:  Recent Labs  10/16/13 0618 10/17/13 0658 10/18/13 0459  HGB 9.8* 9.4* 9.3*  HCT 32.3* 30.7* 31.1*  PLT 261 259 287  CREATININE 7.34* 5.12* 6.88*    Estimated Creatinine Clearance: 12.6 ml/min (by C-G formula based on Cr of 6.88).   Medical History: Past Medical History  Diagnosis Date  . UGI bleed 02/12/11    on anticoagulation; EGD w/snare polypectomy-multiple polypoid lesions antrum(benign), Chronic active gastritis (NEGATIVE H pylori)  . Anemia, chronic disease   . Sepsis due to enterococcus 02/11/11  . Atrial fibrillation     coumadin  . CAD (coronary artery disease)     s/p CABGx4  . Cardiomyopathy     40-45% EF 6/12  . Diabetes mellitus   . Hypertension   . Hyperlipemia   . Gout   . S/P patent foramen ovale closure   . History of nuclear stress test 11/2011    negative myoview  . End stage kidney disease     T/TH/SAT dialyis, Dr Hinda Lenis  . Gout   . History of nuclear stress test 07/04/2010    dipyridamole; mod fixed inferolateral defect; non-diagnostic for ischemia; low risk scan   . Hyperbilirubinemia 08/10/2013    Medications:  Scheduled:  . allopurinol  100 mg Oral Daily  . amiodarone  100 mg Oral Daily  . aspirin  81 mg Oral Daily  . atorvastatin  20 mg Oral q1800  . diltiazem  180 mg Oral Daily  . enoxaparin (LOVENOX) injection  30 mg Subcutaneous Q24H  . epoetin alfa  14,000 Units Intravenous Q T,Th,Sa-HD  .  folic acid  1 mg Oral Daily  . insulin aspart  0-5 Units Subcutaneous QHS  . insulin aspart  0-9 Units Subcutaneous TID WC  . insulin glargine  10 Units Subcutaneous QHS  . metoprolol tartrate  25 mg Oral 3 times per day  . pantoprazole  40 mg Oral BID  . piperacillin-tazobactam (ZOSYN)  IV  2.25 g Intravenous 3 times per day  . sevelamer carbonate  800 mg Oral TID WC  . sodium chloride  3 mL Intravenous Q12H  . traZODone  50 mg Oral QHS  . vancomycin  1,000 mg Intravenous Q T,Th,Sa-HD    Assessment: PTA Warfarin 1 mg Monday-Friday; 1.5 mg Saturday and Sunday Warfarin held for surgery.  INR 1.38 prior to surgery Patient has received prophylaxis Lovenox Patient receiving amiodarone   Goal of Therapy:  INR 2-3 Monitor platelets by anticoagulation protocol: Yes   Plan:  Coumadin 2 mg po today at 6 PM. Slightly above normal dose due to medication held for surgery. INR/PT daily Monitor platelets, CBC    Tyrae Alcoser Bennett 10/18/2013,2:23 PM

## 2013-10-18 NOTE — Progress Notes (Signed)
PROGRESS NOTE  VIGNESH WILLERT EAV:[id number] DOB: 1951-02-05 DOA: 10/13/2013 PCP: [name], MD  Summary: 63 year old man presented with wound dehiscence, noted to have some drainage from the stump and referred to the emergency department. He is admitted for wound dehiscence secondary to ischemia and possible secondary infection.  Assessment/Plan: 1. 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 No evidence of infection during surgery per discussion with Dr. Arnoldo Morale. No wound culture obtained on admission. Surgical consult suggesting breakdown secondary to ischemic changes rather than infection. Complete antibiotics today.. 2. DM with PVD. Capillary blood sugars stable. Continue Lantus and sliding scale insulin. 3. ESRD on HD. Per nephrology. 4. Atrial fibrillation on warfarin as an outpatient. Stable. Continue amiodarone, metoprolol, diltiazem. 5. History of CAD, CABG, ischemic cardiomyopathy left ventricular ejection fraction 40-45%, tissue mitral valve replacement. Stable.   Overall improving. Case discussed with Dr. Arnoldo Morale who feels the patient can be discharged to a skilled nursing facility. May resume anticoagulation.  Resume warfarin. Resume prophylactic Lovenox.  Consult social work. Consult physical therapy.  Anticipate discharge next 48 hours.  Code Status: full code DVT prophylaxis: Lovenox Family Communication: none present Disposition Plan: pending further evaluation  [name], MD  [facility]  Pager [phone] If 7PM-7AM, please contact night-coverage at [email or web address], password [facility] 10/18/2013, 2:09 PM  LOS: 5 days   Consultants:  General surgery  Nephrology  Procedures:  2/20: Right above-the-knee amputation  Antibiotics:  Vancomycin 2/16  >>   Zosyn 2/16 >>     HPI/Subjective: Poor sleep but otherwise feels okay. Pain controlled with medication.  Objective: Filed Vitals:   10/17/13 2106 10/18/13 0148 10/18/13 0421 10/18/13 1027    BP: 108/76 164/68 131/91 140/80  Pulse: 105 117 111 102  Temp: 99.6 F (37.6 C) 97.6 F (36.4 C) 97.6 F (36.4 C) 97.6 F (36.4 C)  TempSrc: Axillary Oral Oral Oral  Resp: 20 20 20 20   Height:      Weight:   90.6 kg (199 lb 11.8 oz)   SpO2: 97% 92% 94% 96%    Intake/Output Summary (Last 24 hours) at 10/18/13 1409 Last data filed at 10/18/13 1235  Gross per 24 hour  Intake    550 ml  Output      0 ml  Net    550 ml     Filed Weights   10/17/13 0520 10/17/13 0815 10/18/13 0421  Weight: 91.8 kg (202 lb 6.1 oz) 91.627 kg (202 lb) 90.6 kg (199 lb 11.8 oz)    Exam:   Afebrile, vital signs are stable. No hypoxia.  General. Appears calm and comfortable.  Cardiovascular. Regular rate and rhythm. No murmur, rub or gallop. No left lower extremity edema.  Respiratory clear to auscultation bilaterally. No wheezes, rales or rhonchi. Normal respiratory effort.  Psychiatric. Grossly normal mood and affect. Speech fluent and appropriate.  Data Reviewed:  Capillary blood sugars stable.  Basic metabolic panel noted. Potassium normal.  Hemoglobin stable, 9.3.  Scheduled Meds: . allopurinol  100 mg Oral Daily  . amiodarone  100 mg Oral Daily  . aspirin  81 mg Oral Daily  . atorvastatin  20 mg Oral q1800  . diltiazem  180 mg Oral Daily  . epoetin alfa  14,000 Units Intravenous Q T,Th,Sa-HD  . folic acid  1 mg Oral Daily  . insulin aspart  0-5 Units Subcutaneous QHS  . insulin aspart  0-9 Units Subcutaneous TID WC  . insulin glargine  10 Units Subcutaneous QHS  . metoprolol tartrate  25 mg Oral 3 times per day  . pantoprazole  40 mg Oral BID  . piperacillin-tazobactam (ZOSYN)  IV  2.25 g Intravenous 3 times per day  . sevelamer carbonate  800 mg Oral TID WC  . sodium chloride  3 mL Intravenous Q12H  . traZODone  50 mg Oral QHS  . vancomycin  1,000 mg Intravenous Q T,Th,Sa-HD   Continuous Infusions: . lactated ringers 75 mL/hr at 10/18/13 0240    Principal Problem:    Wound dehiscence Active Problems:   Anemia, chronic disease   Atrial fibrillation   ESRD (end stage renal disease) on dialysis   Right BKA infection   Wound infection   Time spent 15 minutes

## 2013-10-18 NOTE — Progress Notes (Addendum)
[name] [name]  MRN: [id number]  DOB/AGE: 09/24/1950 63 y.o.  Primary Care Physician:FUSCO,LAWRENCE J., MD  Admit date: 10/13/2013  Chief Complaint:  Chief Complaint  Patient presents with  . Drainage from Incision    S-Pt presented on  10/13/2013 with  Chief Complaint  Patient presents with  . Drainage from Incision  .   Pt offers no new complaints  Meds  . allopurinol  100 mg Oral Daily  . amiodarone  100 mg Oral Daily  . aspirin  81 mg Oral Daily  . atorvastatin  20 mg Oral q1800  . diltiazem  180 mg Oral Daily  . epoetin alfa  14,000 Units Intravenous Q T,Th,Sa-HD  . folic acid  1 mg Oral Daily  . insulin aspart  0-5 Units Subcutaneous QHS  . insulin aspart  0-9 Units Subcutaneous TID WC  . insulin glargine  10 Units Subcutaneous QHS  . metoprolol tartrate  25 mg Oral 3 times per day  . pantoprazole  40 mg Oral BID  . piperacillin-tazobactam (ZOSYN)  IV  2.25 g Intravenous 3 times per day  . sevelamer carbonate  800 mg Oral TID WC  . sodium chloride  3 mL Intravenous Q12H  . traZODone  50 mg Oral QHS  . vancomycin  1,000 mg Intravenous Q T,Th,Sa-HD       Physical Exam: Vital signs in last 24 hours: Temp:  [97.3 F (36.3 C)-99.6 F (37.6 C)] 97.6 F (36.4 C) (02/21 0421) Pulse Rate:  [61-117] 111 (02/21 0421) Resp:  [16-25] 20 (02/21 0421) BP: (76-164)/(50-91) 131/91 mmHg (02/21 0421) SpO2:  [92 %-100 %] 94 % (02/21 0421) Weight:  [199 lb 11.8 oz (90.6 kg)] 199 lb 11.8 oz (90.6 kg) (02/21 0421) Weight change: -6.1 oz (-0.174 kg) Last BM Date: 10/16/13  Intake/Output from previous day: 02/20 0701 - 02/21 0700 In: 950 [P.O.:350; I.V.:600] Out: 103 [Blood:75]     Physical Exam: General- pt is awake,alert, oriented to time place and person  Resp- No acute REsp distress, CTA B/L NO Rhonchi  CVS- S1S2 regular in rate and rhythm  GIT- BS+, soft, NT, ND  EXT- NO LE Edema, Cyanosis, Right BKA  Access- AVF+ Bruit    Lab Results: CBC  Recent Labs  10/17/13 0658 10/18/13 0459  WBC 8.5 12.2*  HGB 9.4* 9.3*  HCT 30.7* 31.1*  PLT 259 287    BMET  Recent Labs  10/17/13 0658 10/18/13 0459  NA 144 141  K 4.2 4.6  CL 99 97  CO2 31 28  GLUCOSE 73 123*  BUN 14 22  CREATININE 5.12* 6.88*  CALCIUM 9.4 10.0    MICRO Recent Results (from the past 240 hour(s))  SURGICAL PCR SCREEN     Status: None   Collection Time    10/16/13  2:00 PM      Result Value Ref Range Status   MRSA, PCR NEGATIVE  NEGATIVE Final   Staphylococcus aureus NEGATIVE  NEGATIVE Final   Comment:            The Xpert SA Assay (FDA     approved for NASAL specimens     in patients over 24 years of age),     is one component of     a comprehensive surveillance     program.  Test performance has     been validated by [employer] for patients greater     than or equal to 45 year old.  It is not intended     to diagnose infection nor to     guide or monitor treatment.      Lab Results  Component Value Date   CALCIUM 10.0 10/18/2013   PHOS 4.3 10/16/2013     Impression: 1)Renal  ESRD on HD  ON TTS schedule  Pt to be  Dialyzed today  2)HTN- stable Was hypotensive earlier Medication-  On Calcium Channel Blockers  On Beta blockers  3)Anemia HGb at goal (9--11)  On EPO  4)CKD Mineral-Bone Disorder  Phosphorus at goal.     On Binders Calcium at goal   5)ID-PVD+ Draining ulcer  Admitted with Wound Infection On Vanco + Zosyn Surgery and Primary MD following  Pt has BKA revision today  6)Electrolytes  Was Hypokalemic now better Normonatremic   7)Acid base  Co2 at goal    9) Afib On amiodarone   Plan:  Pt is to be dialyzed today  Addendum  Pt seen on Dialysis. Pt tolerating tx well.   Tametha Banning S 10/18/2013, 9:54 AM

## 2013-10-19 LAB — GLUCOSE, CAPILLARY
Glucose-Capillary: 89 mg/dL (ref 70–99)
Glucose-Capillary: 109 mg/dL — ABNORMAL HIGH (ref 70–99)
Glucose-Capillary: 109 mg/dL — ABNORMAL HIGH (ref 70–99)
Glucose-Capillary: 75 mg/dL (ref 70–99)
Glucose-Capillary: 176 mg/dL — ABNORMAL HIGH (ref 70–99)

## 2013-10-19 LAB — CBC
HCT: 29.9 % — ABNORMAL LOW (ref 39.0–52.0)
Hemoglobin: 8.9 g/dL — ABNORMAL LOW (ref 13.0–17.0)
MCH: 27.7 pg (ref 26.0–34.0)
MCHC: 29.8 g/dL — ABNORMAL LOW (ref 30.0–36.0)
MCV: 93.1 fL (ref 78.0–100.0)
PLATELETS: 247 10*3/uL (ref 150–400)
RBC: 3.21 MIL/uL — AB (ref 4.22–5.81)
RDW: 17.1 % — AB (ref 11.5–15.5)
WBC: 10.2 10*3/uL (ref 4.0–10.5)

## 2013-10-19 LAB — PROTIME-INR
INR: 1.51 — ABNORMAL HIGH (ref 0.00–1.49)
PROTHROMBIN TIME: 17.8 s — AB (ref 11.6–15.2)

## 2013-10-19 MED ORDER — WARFARIN SODIUM 1 MG PO TABS
1.5000 mg | ORAL_TABLET | Freq: Once | ORAL | Status: AC
  Administered 2013-10-19: 1.5 mg via ORAL
  Filled 2013-10-19: qty 2

## 2013-10-19 NOTE — Progress Notes (Signed)
PROGRESS NOTE  [name] [name] EYC:[id number] DOB: [birth year] DOA: 10/13/2013 PCP: [name], MD  Summary: 63 year old man presented with wound dehiscence, noted to have some drainage from the stump and referred to the emergency department. He is admitted for wound dehiscence secondary to ischemia and possible secondary infection.  Assessment/Plan: 1. 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 No evidence of infection during surgery per discussion with Dr. Arnoldo Morale. No wound culture obtained on admission. Surgical consult suggested breakdown secondary to ischemic changes rather than infection. Complete antibiotics 2/22. 2. DM with PVD. Capillary blood sugars stable. Continue Lantus and sliding scale insulin. 3. ESRD on HD. Per nephrology. 4. Atrial fibrillation on warfarin as an outpatient. Stable. Continue amiodarone, metoprolol, diltiazem. 5. History of CAD, CABG, ischemic cardiomyopathy left ventricular ejection fraction 40-45%, tissue mitral valve replacement. Stable.   Continues to improve. Plan discharge home versus skilled nursing facility 2/23. Continue anticoagulation. Discussed with Dr. Arnoldo Morale, will discontinue antibiotics 2/23.  Consults physical therapy pending.  Code Status: full code DVT prophylaxis: Lovenox Family Communication: none present Disposition Plan: pending further evaluation  [name], MD  [facility]  Pager [phone] If 7PM-7AM, please contact night-coverage at [email or web address], password [facility] 10/19/2013, 4:52 PM  LOS: 6 days   Consultants:  General surgery  Nephrology  Procedures:  2/20: Right above-the-knee amputation  Antibiotics:  Vancomycin 2/16  >>   Zosyn 2/16 >>     HPI/Subjective: Overall he is feeling better.  Objective: Filed Vitals:   10/18/13 2149 10/19/13 0420 10/19/13 1032 10/19/13 1300  BP: 113/78 139/81 109/68 123/76  Pulse: 112 107 97 98  Temp: 97.5 F (36.4 C) 98.4 F (36.9 C) 98.6 F (37 C) 98.9 F  (37.2 C)  TempSrc: Oral Oral Oral Oral  Resp: 20 20 20 20   Height:      Weight:  87.3 kg (192 lb 7.4 oz)    SpO2: 96% 96% 97% 98%    Intake/Output Summary (Last 24 hours) at 10/19/13 1652 Last data filed at 10/19/13 1300  Gross per 24 hour  Intake    380 ml  Output   2270 ml  Net  -1890 ml     Filed Weights   10/18/13 0421 10/18/13 1730 10/19/13 0420  Weight: 90.6 kg (199 lb 11.8 oz) 89.4 kg (197 lb 1.5 oz) 87.3 kg (192 lb 7.4 oz)    Exam:   Afebrile, vital signs are stable. No hypoxia.  General. Appears calm and comfortable.  Respiratory clear to auscultation bilaterally. No wheezes, rales or rhonchi. Normal respiratory effort.  Cardiovascular irregular rhythm, normal rate. No murmur, rub or gallop.   Psychiatric grossly normal mood and affect. Speech fluent and appropriate.  Data Reviewed:  Capillary blood sugars stable. No hypoxia.  Hemoglobin stable 8.9. Leukocytosis resolved.  INR 1.51.  Scheduled Meds: . allopurinol  100 mg Oral Daily  . amiodarone  100 mg Oral Daily  . aspirin  81 mg Oral Daily  . atorvastatin  20 mg Oral q1800  . diltiazem  180 mg Oral Daily  . enoxaparin (LOVENOX) injection  30 mg Subcutaneous Q24H  . epoetin alfa  14,000 Units Intravenous Q T,Th,Sa-HD  . folic acid  1 mg Oral Daily  . insulin aspart  0-5 Units Subcutaneous QHS  . insulin aspart  0-9 Units Subcutaneous TID WC  . insulin glargine  10 Units Subcutaneous QHS  . metoprolol tartrate  25 mg Oral 3 times per day  . pantoprazole  40 mg Oral BID  . piperacillin-tazobactam (  ZOSYN)  IV  2.25 g Intravenous 3 times per day  . sevelamer carbonate  800 mg Oral TID WC  . sodium chloride  3 mL Intravenous Q12H  . traZODone  50 mg Oral QHS  . vancomycin  1,000 mg Intravenous Q T,Th,Sa-HD  . warfarin  1.5 mg Oral Once  . Warfarin - Pharmacist Dosing Inpatient   Does not apply Q24H   Continuous Infusions:    Principal Problem:   Wound dehiscence Active Problems:   Anemia,  chronic disease   Atrial fibrillation   ESRD (end stage renal disease) on dialysis   Right BKA infection   Wound infection   Time spent 15 minutes

## 2013-10-19 NOTE — Addendum Note (Signed)
Addendum created 10/19/13 0846 by Vista Deck, CRNA   Modules edited: Notes Section   Notes Section:  File: [id number]

## 2013-10-19 NOTE — Progress Notes (Signed)
[facility] for Warfarin Indication: atrial fibrillation  Allergies  Allergen Reactions  . Bacitracin Other (See Comments)    unknown  . Norvasc [Amlodipine Besylate] Other (See Comments)    unknown    Patient Measurements: Height: 6\' 2"  (188 cm) Weight: 192 lb 7.4 oz (87.3 kg) IBW/kg (Calculated) : 82.2 Heparin Dosing Weight:   Vital Signs: Temp: 98.4 F (36.9 C) (02/22 0420) Temp src: Oral (02/22 0420) BP: 139/81 mmHg (02/22 0420) Pulse Rate: 107 (02/22 0420)  Labs:  Recent Labs  10/17/13 0658 10/18/13 0459 10/19/13 0515  HGB 9.4* 9.3* 8.9*  HCT 30.7* 31.1* 29.9*  PLT 259 287 247  LABPROT  --   --  17.8*  INR  --   --  1.51*  CREATININE 5.12* 6.88*  --     Estimated Creatinine Clearance: 12.6 ml/min (by C-G formula based on Cr of 6.88).   Medical History: Past Medical History  Diagnosis Date  . UGI bleed 02/12/11    on anticoagulation; EGD w/snare polypectomy-multiple polypoid lesions antrum(benign), Chronic active gastritis (NEGATIVE H pylori)  . Anemia, chronic disease   . Sepsis due to enterococcus 02/11/11  . Atrial fibrillation     coumadin  . CAD (coronary artery disease)     s/p CABGx4  . Cardiomyopathy     40-45% EF 6/12  . Diabetes mellitus   . Hypertension   . Hyperlipemia   . Gout   . S/P patent foramen ovale closure   . History of nuclear stress test 11/2011    negative myoview  . End stage kidney disease     T/TH/SAT dialyis, Dr Hinda Lenis  . Gout   . History of nuclear stress test 07/04/2010    dipyridamole; mod fixed inferolateral defect; non-diagnostic for ischemia; low risk scan   . Hyperbilirubinemia 08/10/2013    Medications:  Scheduled:  . allopurinol  100 mg Oral Daily  . amiodarone  100 mg Oral Daily  . aspirin  81 mg Oral Daily  . atorvastatin  20 mg Oral q1800  . diltiazem  180 mg Oral Daily  . enoxaparin (LOVENOX) injection  30 mg Subcutaneous Q24H  . epoetin alfa  14,000 Units  Intravenous Q T,Th,Sa-HD  . folic acid  1 mg Oral Daily  . insulin aspart  0-5 Units Subcutaneous QHS  . insulin aspart  0-9 Units Subcutaneous TID WC  . insulin glargine  10 Units Subcutaneous QHS  . metoprolol tartrate  25 mg Oral 3 times per day  . pantoprazole  40 mg Oral BID  . piperacillin-tazobactam (ZOSYN)  IV  2.25 g Intravenous 3 times per day  . sevelamer carbonate  800 mg Oral TID WC  . sodium chloride  3 mL Intravenous Q12H  . traZODone  50 mg Oral QHS  . vancomycin  1,000 mg Intravenous Q T,Th,Sa-HD  . Warfarin - Pharmacist Dosing Inpatient   Does not apply Q24H    Assessment: PTA Warfarin 1 mg Monday-Friday; 1.5 mg Saturday and Sunday Warfarin held for surgery.  INR 1.38 prior to surgery Patient has received prophylaxis Lovenox Patient receiving amiodarone  INR rising to goal  Goal of Therapy:  INR 2-3 Monitor platelets by anticoagulation protocol: Yes   Plan:  Coumadin 1.5 mg po today, home regiment INR/PT daily Monitor platelets, CBC    [name] [name] 10/19/2013,8:28 AM

## 2013-10-19 NOTE — Progress Notes (Signed)
Subjective: Interval History: has no complaint of he difficulty in breathing. Presently patient denies any nausea and vomiting. His appetite is good. Overall patient seems to be improving  Objective: Vital signs in last 24 hours: Temp:  [97.5 F (36.4 C)-98.4 F (36.9 C)] 98.4 F (36.9 C) (02/22 0420) Pulse Rate:  [88-116] 107 (02/22 0420) Resp:  [16-20] 20 (02/22 0420) BP: (113-167)/(59-89) 139/81 mmHg (02/22 0420) SpO2:  [96 %-100 %] 96 % (02/22 0420) Weight:  [87.3 kg (192 lb 7.4 oz)-89.4 kg (197 lb 1.5 oz)] 87.3 kg (192 lb 7.4 oz) (02/22 0420) Weight change: -2.227 kg (-4 lb 14.5 oz)  Intake/Output from previous day: 02/21 0701 - 02/22 0700 In: 200 [P.O.:200] Out: 2069  Intake/Output this shift:    General appearance: alert, cooperative and no distress Resp: clear to auscultation bilaterally Cardio: regular rate and rhythm, S1, S2 normal, no murmur, click, rub or gallop GI: soft, non-tender; bowel sounds normal; no masses,  no organomegaly Extremities: extremities normal, atraumatic, no cyanosis or edema  Lab Results:  Recent Labs  10/18/13 0459 10/19/13 0515  WBC 12.2* 10.2  HGB 9.3* 8.9*  HCT 31.1* 29.9*  PLT 287 247   BMET:  Recent Labs  10/17/13 0658 10/18/13 0459  NA 144 141  K 4.2 4.6  CL 99 97  CO2 31 28  GLUCOSE 73 123*  BUN 14 22  CREATININE 5.12* 6.88*  CALCIUM 9.4 10.0   No results found for this basename: PTH,  in the last 72 hours Iron Studies: No results found for this basename: IRON, TIBC, TRANSFERRIN, FERRITIN,  in the last 72 hours  Studies/Results: No results found.  I have reviewed the patient's current medications.  Assessment/Plan: Problem #1 end-stage renal disease: Patient is status post hemodialysis yesterday and the presently he denies any nausea or vomiting. His potassium is 4.6. Problem #2 anemia: His hemoglobin 8.9 hematocrit 29.9 slightly low but stable. Problem #3 history of a trial fibrillation his heart rate is  controlled Problem #4 hypertension blood pressure is also was in acceptable range. Problem #5 metabolic bone disease his calcium and phosphorus is in goal Problem #6 status post revision of his right BKA. Presently patient is a febrile, white blood cell count is normal. Patient is on antibiotics. Problem #7 history of coronary artery disease: Patient does not have any chest pain. Problem #8 status post mitral valve replacement Plan: We'll continue his present management We'll check a CBC, phosphorus and basic metabolic panel in the morning. We'll make arrangements for patient to get dialysis possibly on Tuesday which is his regular schedule.      LOS: 6 days    Katelynn Heidler S 10/19/2013,8:54 AM

## 2013-10-19 NOTE — Anesthesia Postprocedure Evaluation (Signed)
Anesthesia Post Note  Patient: [name] [name]  Procedure(s) Performed: Procedure(s) (LRB): AMPUTATION ABOVE KNEE (Right)  Anesthesia type: Spinal  Patient location: 336  Post pain: Pain level controlled  Post assessment: Post-op Vital signs reviewed, Patient's Cardiovascular Status Stable, Respiratory Function Stable, Patent Airway, No signs of Nausea or vomiting and Pain level controlled  Last Vitals:  Filed Vitals:   10/19/13 0420  BP: 139/81  Pulse: 107  Temp: 36.9 C  Resp: 20    Post vital signs: Reviewed and stable  Level of consciousness: awake and alert   Complications: No apparent anesthesia complications

## 2013-10-19 NOTE — Progress Notes (Signed)
2 Days Post-Op  Subjective: Resting comfortably.  Objective: Vital signs in last 24 hours: Temp:  [97.5 F (36.4 C)-98.4 F (36.9 C)] 98.4 F (36.9 C) (02/22 0420) Pulse Rate:  [88-116] 107 (02/22 0420) Resp:  [16-20] 20 (02/22 0420) BP: (113-167)/(59-89) 139/81 mmHg (02/22 0420) SpO2:  [96 %-100 %] 96 % (02/22 0420) Weight:  [87.3 kg (192 lb 7.4 oz)-89.4 kg (197 lb 1.5 oz)] 87.3 kg (192 lb 7.4 oz) (02/22 0420) Last BM Date: 10/16/13  Intake/Output from previous day: 02/21 0701 - 02/22 0700 In: 200 [P.O.:200] Out: 2069  Intake/Output this shift: Total I/O In: 150 [P.O.:150] Out: -   General appearance: no distress Extremities: Right above-the-knee amputation site healing well.  Lab Results:   Recent Labs  10/18/13 0459 10/19/13 0515  WBC 12.2* 10.2  HGB 9.3* 8.9*  HCT 31.1* 29.9*  PLT 287 247   BMET  Recent Labs  10/17/13 0658 10/18/13 0459  NA 144 141  K 4.2 4.6  CL 99 97  CO2 31 28  GLUCOSE 73 123*  BUN 14 22  CREATININE 5.12* 6.88*  CALCIUM 9.4 10.0   PT/INR  Recent Labs  10/19/13 0515  LABPROT 17.8*  INR 1.51*    Studies/Results: No results found.  Anti-infectives: Anti-infectives   Start     Dose/Rate Route Frequency Ordered Stop   10/14/13 1200  vancomycin (VANCOCIN) IVPB 1000 mg/200 mL premix     1,000 mg 200 mL/hr over 60 Minutes Intravenous Every T-Th-Sa (Hemodialysis) 10/14/13 1115     10/13/13 2200  vancomycin (VANCOCIN) 1,500 mg in sodium chloride 0.9 % 500 mL IVPB     1,500 mg 250 mL/hr over 120 Minutes Intravenous  Once 10/13/13 2048 10/14/13 0000   10/13/13 2200  piperacillin-tazobactam (ZOSYN) IVPB 2.25 g     2.25 g 100 mL/hr over 30 Minutes Intravenous 3 times per day 10/13/13 2048        Assessment/Plan: s/p Procedure(s): AMPUTATION ABOVE KNEE Impression: Stable postoperative day 2, status post right above-the-knee dictation. Leukocytosis has resolved. Hemoglobin is stable. Stable for transfer to rehabilitation  facility when available.  LOS: 6 days    Jorrell Kuster A 10/19/2013

## 2013-10-20 ENCOUNTER — Telehealth: Payer: Self-pay | Admitting: Internal Medicine

## 2013-10-20 ENCOUNTER — Encounter (HOSPITAL_COMMUNITY): Payer: Self-pay | Admitting: General Surgery

## 2013-10-20 LAB — TYPE AND SCREEN
ABO/RH(D): O POS
ANTIBODY SCREEN: NEGATIVE
UNIT DIVISION: 0
Unit division: 0
Unit division: 0

## 2013-10-20 LAB — CBC
HCT: 30 % — ABNORMAL LOW (ref 39.0–52.0)
HEMOGLOBIN: 9 g/dL — AB (ref 13.0–17.0)
MCH: 27.7 pg (ref 26.0–34.0)
MCHC: 30 g/dL (ref 30.0–36.0)
MCV: 92.3 fL (ref 78.0–100.0)
PLATELETS: 271 10*3/uL (ref 150–400)
RBC: 3.25 MIL/uL — ABNORMAL LOW (ref 4.22–5.81)
RDW: 16.9 % — ABNORMAL HIGH (ref 11.5–15.5)
WBC: 10.9 10*3/uL — AB (ref 4.0–10.5)

## 2013-10-20 LAB — GLUCOSE, CAPILLARY
Glucose-Capillary: 103 mg/dL — ABNORMAL HIGH (ref 70–99)
Glucose-Capillary: 183 mg/dL — ABNORMAL HIGH (ref 70–99)

## 2013-10-20 LAB — BASIC METABOLIC PANEL
BUN: 22 mg/dL (ref 6–23)
CHLORIDE: 100 meq/L (ref 96–112)
CO2: 30 meq/L (ref 19–32)
Calcium: 9.6 mg/dL (ref 8.4–10.5)
Creatinine, Ser: 7.04 mg/dL — ABNORMAL HIGH (ref 0.50–1.35)
GFR calc Af Amer: 8 mL/min — ABNORMAL LOW (ref >90)
GFR calc non Af Amer: 7 mL/min — ABNORMAL LOW (ref >90)
GLUCOSE: 117 mg/dL — AB (ref 70–99)
Potassium: 4.7 mEq/L (ref 3.7–5.3)
SODIUM: 143 meq/L (ref 137–147)

## 2013-10-20 LAB — PHOSPHORUS: PHOSPHORUS: 4.4 mg/dL (ref 2.3–4.6)

## 2013-10-20 LAB — PROTIME-INR
INR: 1.78 — AB (ref 0.00–1.49)
Prothrombin Time: 20.2 seconds — ABNORMAL HIGH (ref 11.6–15.2)

## 2013-10-20 MED ORDER — WARFARIN SODIUM 2 MG PO TABS: 2.0000 mg | ORAL_TABLET | Freq: Once | ORAL | Status: DC

## 2013-10-20 NOTE — Progress Notes (Signed)
Late entry for 1550: pt and spouse verbalize understanding of d/c instructions, medications, and necessary follow up appt with dr. Arnoldo Morale in 1 wk and PCP in 2wks. Pt has no questions at this time. I spoke with Dr. Arnoldo Morale abt wound care and he said just soap and water. I informed pt to keep stump dry, but did give pt lotion per Dr. Arnoldo Morale request to help keep skin around incision from drying out as pt does have dry skin. Pts medications were returned from pharmacy. HH has been set up. Pt d/c via wheelchair accompanied by NT. Marry Guan

## 2013-10-20 NOTE — Discharge Summary (Signed)
Physician Discharge Summary  [name] [name] FKC:[id number] DOB: 1950-12-12 DOA: 10/13/2013  PCP: [name], MD  Admit date: 10/13/2013 Discharge date: 10/20/2013  Recommendations for Outpatient Follow-up:  1. Followup healing of AKA 2. Home health RN, PT. Draw INR 2/25 and send to Dr. [name] office [phone]. Lab Results  Component Value Date   INR 1.78* 10/20/2013   INR 1.51* 10/19/2013   INR 1.38 10/14/2013    Follow-up Information   Follow up with Aviva Signs A, MD. Schedule an appointment as soon as possible for a visit on 10/30/2013.   Specialty:  General Surgery   Contact information:   [address] [address] [phone]       Follow up with [facility].   Contact information:   [address] [place] [address] [phone]       Follow up with [name], MD In 2 weeks.   Specialty:  Internal Medicine   Contact information:   [address] [place] [address] [phone]       Follow up with [name], MD In 1 week.   Specialty:  General Surgery   Contact information:   [address] [place] [address] [phone]      Discharge Diagnoses:  1. 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 Diabetes mellitus with peripheral vascular disease 3. Atrial fibrillation on warfarin 4. End-stage renal disease on hemodialysis  Discharge Condition: improved Disposition: home with [facility]  Diet recommendation: heart healthy, diabetic diet  Filed Weights   10/18/13 1730 10/19/13 0420 10/20/13 0430  Weight: 89.4 kg (197 lb 1.5 oz) 87.3 kg (192 lb 7.4 oz) 87.8 kg (193 lb 9 oz)    History of present illness:  63 year old man presented with wound dehiscence, noted to have some drainage from the stump and referred to the emergency department. He was admitted for wound dehiscence secondary to ischemia and possible secondary infection.  Hospital Course:  Patient was  seen by surgery and plan made for AKA which was accomplished without complication. He has been cleared for discharge. Comorbidities remained stable during this hospitalization. See individual issues below.  1. 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 No evidence of infection during surgery per discussion with Dr. Arnoldo Morale. Breakdown thought to be secondary to ischemic changes rather than infection. No indication for further antibiotics. 2. DM with PVD. Capillary blood sugars stable. Continue Lantus and sliding scale insulin. 3. ESRD on HD. Per nephrology. 4. Atrial fibrillation on warfarin as an outpatient. Stable. Continue amiodarone, metoprolol, diltiazem. 5. History of CAD, CABG, ischemic cardiomyopathy left ventricular ejection fraction 40-45%, tissue mitral valve replacement. Stable.  Consultants:  General surgery  Nephrology  Physical therapy. Home health. Procedures:  2/20: Right above-the-knee amputation Antibiotics:  Vancomycin 2/16 >> 2/22 Zosyn 2/16 >> 2/22  Discharge Instructions  Discharge Orders   Future Orders Complete By Expires   Diet - low sodium heart healthy  As directed    Discharge instructions  As directed    Comments:     Call your physician or seek immediate medical attention for fever, drainage from your wound, problems with your wound, bleeding or worsening of condition.   Discharge wound care:  As directed    Comments:     Per Dr. Arnoldo Morale office   Increase activity slowly  As directed        Medication List    STOP taking these medications       glimepiride 4 MG  tablet  Commonly known as:  AMARYL      TAKE these medications       allopurinol 100 MG tablet  Commonly known as:  ZYLOPRIM  Take 100 mg by mouth daily.     amiodarone 100 MG tablet  Commonly known as:  PACERONE  Take 100 mg by mouth daily.     aspirin 81 MG tablet  Take 81 mg by mouth daily.     atorvastatin 20 MG tablet  Commonly known as:  LIPITOR  Take 20 mg by mouth  daily.     DIALYVITE/ZINC PO  Take by mouth 3 (three) times a week. Patient has dialysis on Tuesdays, Thursdays, and Saturdays     diltiazem 180 MG 24 hr capsule  Commonly known as:  CARDIZEM CD  Take 1 capsule (180 mg total) by mouth daily.     folic acid 1 MG tablet  Commonly known as:  FOLVITE  Take 1 mg by mouth daily.     insulin glargine 100 UNIT/ML injection  Commonly known as:  LANTUS  Inject 10 Units into the skin at bedtime.     metoprolol tartrate 25 MG tablet  Commonly known as:  LOPRESSOR  Take 1 tablet (25 mg total) by mouth 3 (three) times daily.     omeprazole 20 MG capsule  Commonly known as:  PRILOSEC  Take 20 mg by mouth daily.     pantoprazole 40 MG tablet  Commonly known as:  PROTONIX  Take 40 mg by mouth 2 (two) times daily.     RENVELA 800 MG tablet  Generic drug:  sevelamer carbonate  Take 800 mg by mouth 3 (three) times daily with meals.     traMADol 50 MG tablet  Commonly known as:  ULTRAM  Take by mouth every 8 (eight) hours as needed for moderate pain.     traZODone 50 MG tablet  Commonly known as:  DESYREL  Take 1 tablet (50 mg total) by mouth at bedtime.     warfarin 1 MG tablet  Commonly known as:  COUMADIN  Take 1-1.5 mg by mouth daily at 6 PM. Takes 1 tablet (1 mg) Monday thru Friday and takes 1.5 tablets (1.5 mg) on Saturday and Sunday       Allergies  Allergen Reactions  . Bacitracin Other (See Comments)    unknown  . Norvasc [Amlodipine Besylate] Other (See Comments)    unknown    The results of significant diagnostics from this hospitalization (including imaging, microbiology, ancillary and laboratory) are listed below for reference.    Significant Diagnostic Studies: No results found.  Microbiology: Recent Results (from the past 240 hour(s))  SURGICAL PCR SCREEN     Status: None   Collection Time    10/16/13  2:00 PM      Result Value Ref Range Status   MRSA, PCR NEGATIVE  NEGATIVE Final   Staphylococcus aureus  NEGATIVE  NEGATIVE Final   Comment:            The Xpert SA Assay (FDA     approved for NASAL specimens     in patients over 10 years of age),     is one component of     a comprehensive surveillance     program.  Test performance has     been validated by [employer] for patients greater     than or equal to 1 year old.     It is  not intended     to diagnose infection nor to     guide or monitor treatment.    Labs: Basic Metabolic Panel:  Recent Labs Lab 10/15/13 0549 10/16/13 0618 10/17/13 0658 10/18/13 0459 10/20/13 0610  NA 142 143 144 141 143  K 3.6* 4.1 4.2 4.6 4.7  CL 98 99 99 97 100  CO2 29 31 31 28 30   GLUCOSE 95 73 73 123* 117*  BUN 18 25* 14 22 22   CREATININE 5.38* 7.34* 5.12* 6.88* 7.04*  CALCIUM 9.2 10.1 9.4 10.0 9.6  PHOS 2.6 4.3  --   --  4.4   CBC:  Recent Labs Lab 10/13/13 1616  10/16/13 0618 10/17/13 0658 10/18/13 0459 10/19/13 0515 10/20/13 0610  WBC 11.0*  < > 7.3 8.5 12.2* 10.2 10.9*  NEUTROABS 8.6*  --   --  6.4  --   --   --   HGB 9.7*  < > 9.8* 9.4* 9.3* 8.9* 9.0*  HCT 31.5*  < > 32.3* 30.7* 31.1* 29.9* 30.0*  MCV 92.9  < > 93.1 93.3 94.2 93.1 92.3  PLT 314  < > 261 259 287 247 271  < > = values in this interval not displayed.  CBG:  Recent Labs Lab 10/19/13 1631 10/19/13 2051 10/19/13 2325 10/20/13 0731 10/20/13 1128  GLUCAP 109* 75 176* 103* 183*    Principal Problem:   Wound dehiscence Active Problems:   Anemia, chronic disease   Atrial fibrillation   ESRD (end stage renal disease) on dialysis   Right BKA infection   Wound infection   Time coordinating discharge: 35 minutes  Signed:  [name], MD [facility] 10/20/2013, 1:50 PM

## 2013-10-20 NOTE — Discharge Instructions (Signed)
Amputation Many new amputations occur each year. The most common causes of amputation of the lower extremity (the hip down) are:  Disease.  Injury caused in an accidents or wars (trauma).  Birth defects.  Lumps (tumors) that are cancer. Upper extremity amputation is usually the result of trauma or birth defect, with disease being a less common cause. COMMON PROBLEMS After an amputation a number of issues need to be considered. Getting around and self-care are early problems that must be dealt with. A complete rehabilitation program will help the amputee recover mobility. A team approach of caregivers helps the most. Caregivers that can provide a well rounded program include:   Physicians.  Therapists.  Nurses.  Social workers.  Psychologists. Usually there are problems with body image and coping with lifestyle changes. A grieving period similar to dealing with a death in the family is common after an amputation. Talking to a trained professional with experience in treating people with similar problems can be very helpful. When returning to a previous lifestyle, questions about sexuality can arise. Many of these uncertainties are normal. These can be discussed with your psychologist or rehabilitation specialist. REHABILITATION AND RETURN TO WORK AND ACTIVITIES Returning to recreational activities and employment are part of recovery. Many times, changes to recreation equipment can allow return to a sport or hobby. A device that substitutes the missing part of the body is called a prosthetic. Many prosthetic manufacturers produce components designed for sports. Be sure to discuss all of your leisure interests with your prosthetist. This is the person who helps provide you with custom made replacement limbs. Your physician will also help to select a prosthetic that will meet your needs. Employers will vary in their willingness to change a work environment in order to help people with  disabilities. Your therapists can perform job site evaluations. Your therapist can then make recommendations to help with your work area. Some amputees will not be able to return to previous jobs. Your local Office of Vocational Rehabilitation can assist you in job retraining.  Once you are past the initial rehabilitation stage you will have ongoing contact with caregivers and a prosthetist. You need to work closely with them in making decisions about your prosthetic device. PROGNOSIS  Amputation should not end your joy of life. There are people with limb loss in nearly all walks of life. They are in a wide variety of professions. They participate in nearly all sports. Ask your caregivers about support groups and sports organizations in your area. They can help you with referral to organizations that will be helpful to you. Document Released: 05/06/2002 Document Revised: 11/06/2011 Document Reviewed: 07/01/2007 [facility] Patient Information 2014 [place], [place].

## 2013-10-20 NOTE — Telephone Encounter (Signed)
Message forwarded to K. Alvstad, PharmD.  

## 2013-10-20 NOTE — Telephone Encounter (Signed)
Would like to know if the patient INR results can still be sent here .Marland Kitchen The patient is under home health.. Thanks

## 2013-10-20 NOTE — Progress Notes (Signed)
UR chart review completed.  

## 2013-10-20 NOTE — Progress Notes (Signed)
Subjective: Interval History: has no complaint of he difficulty in breathing. Presently patient denies any nausea and vomiting. His appetite is good. Patient offers no complaint  Objective: Vital signs in last 24 hours: Temp:  [97.4 F (36.3 C)-98.9 F (37.2 C)] 97.4 F (36.3 C) (02/23 0430) Pulse Rate:  [97-107] 104 (02/23 0430) Resp:  [20] 20 (02/23 0430) BP: (102-123)/(68-81) 115/77 mmHg (02/23 0430) SpO2:  [93 %-98 %] 97 % (02/23 0430) Weight:  [87.8 kg (193 lb 9 oz)] 87.8 kg (193 lb 9 oz) (02/23 0430) Weight change: -1.6 kg (-3 lb 8.4 oz)  Intake/Output from previous day: 02/22 0701 - 02/23 0700 In: 380 [P.O.:380] Out: 201 [Urine:200; Stool:1] Intake/Output this shift:    General appearance: alert, cooperative and no distress Resp: clear to auscultation bilaterally Cardio: regular rate and rhythm, S1, S2 normal, no murmur, click, rub or gallop GI: soft, non-tender; bowel sounds normal; no masses,  no organomegaly Extremities: extremities normal, atraumatic, no cyanosis or edema  Lab Results:  Recent Labs  10/19/13 0515 10/20/13 0610  WBC 10.2 10.9*  HGB 8.9* 9.0*  HCT 29.9* 30.0*  PLT 247 271   BMET:   Recent Labs  10/18/13 0459 10/20/13 0610  NA 141 143  K 4.6 4.7  CL 97 100  CO2 28 30  GLUCOSE 123* 117*  BUN 22 22  CREATININE 6.88* 7.04*  CALCIUM 10.0 9.6   No results found for this basename: PTH,  in the last 72 hours Iron Studies: No results found for this basename: IRON, TIBC, TRANSFERRIN, FERRITIN,  in the last 72 hours  Studies/Results: No results found.  I have reviewed the patient's current medications.  Assessment/Plan: Problem #1 end-stage renal disease: Patient is status post hemodialysis the day before yesterday and the presently he denies any nausea or vomiting. His potassium is 4.7. Problem #2 anemia: His hemoglobin 9 hematocrit 20  low but stable. Problem #3 history of a trial fibrillation his heart rate is controlled Problem #4  hypertension blood pressure is also was in acceptable range. Problem #5 metabolic bone disease his calcium and phosphorus is in goal Problem #6 status post revision of his right BKA.  Presently AKA is done.  Patient is a febrile with normal white blood cell count. Patient is off  antibiotics  Problem #7 history of coronary artery disease: Patient does not have any chest pain. Problem #8 status post mitral valve replacement Plan: We'll continue his present management We'll check a CBC,  and basic metabolic panel in the morning. We'll make arrangements for patient to get dialysis possibly Tomorrow if he is here , other wise we will make out patient dialysis in am  which is his regular schedule.      LOS: 7 days    Kinsie Belford S 10/20/2013,9:37 AM

## 2013-10-20 NOTE — Progress Notes (Signed)
PROGRESS NOTE  [name] [name] JJO:[id number] DOB: August 13, 1951 DOA: 10/13/2013 PCP: [name] [name]., MD  Summary: 63 year old man presented with wound dehiscence, noted to have some drainage from the stump and referred to the emergency department. He is admitted for wound dehiscence secondary to ischemia and possible secondary infection.  Assessment/Plan: 1. 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 Now status post AKA. Doing well. Cleared by surgery for discharge. No evidence of infection during surgery per discussion with Dr. Arnoldo Morale. Breakdown thought to be secondary to ischemic changes rather than infection. No indication for further antibiotics. 2. DM with PVD. Capillary blood sugars stable. Continue Lantus and sliding scale insulin. 3. ESRD on HD. Per nephrology. 4. Atrial fibrillation on warfarin as an outpatient. Stable. Continue amiodarone, metoprolol, diltiazem. 5. History of CAD, CABG, ischemic cardiomyopathy left ventricular ejection fraction 40-45%, tissue mitral valve replacement. Stable.   Home today with home health, RN, PT  Code Status: full code DVT prophylaxis: Lovenox Family Communication: none present Disposition Plan: pending further evaluation  [name], MD  [facility]  Pager [phone] If 7PM-7AM, please contact night-coverage at [email or web address], password [facility] 10/20/2013, 1:31 PM  LOS: 7 days   Consultants:  General surgery  Nephrology  Physical therapy. Home health.  Procedures:  2/20: Right above-the-knee amputation  Antibiotics:  Vancomycin 2/16  >> 2/22  Zosyn 2/16 >>  2/22   HPI/Subjective: No complaints. Did well with physical therapy. Wants to go home.  Objective: Filed Vitals:   10/19/13 1300 10/19/13 1846 10/19/13 2014 10/20/13 0430  BP: 123/76 102/71 110/81 115/77  Pulse: 98 100 107 104  Temp: 98.9 F (37.2 C) 98.6 F (37 C) 97.8 F (36.6 C) 97.4 F (36.3 C)  TempSrc: Oral Oral Oral Oral  Resp: 20 20 20 20     Height:      Weight:    87.8 kg (193 lb 9 oz)  SpO2: 98% 94% 93% 97%   No intake or output data in the 24 hours ending 10/20/13 1331   Filed Weights   10/18/13 1730 10/19/13 0420 10/20/13 0430  Weight: 89.4 kg (197 lb 1.5 oz) 87.3 kg (192 lb 7.4 oz) 87.8 kg (193 lb 9 oz)    Exam:   Afebrile, vital signs are stable. No hypoxia.  General. Sitting in chair.  Cardiovascular regular rate and rhythm. No murmur, rub or gallop.   Respiratory clear to auscultation bilaterally. No wheezes, rales or rhonchi. Normal respiratory effort.  Data Reviewed:  Capillary blood sugars stable.   Basic metabolic panel consistent with end-stage renal disease.  Hemoglobin stable, 9.0.  INR 1.78  Scheduled Meds: . allopurinol  100 mg Oral Daily  . amiodarone  100 mg Oral Daily  . aspirin  81 mg Oral Daily  . atorvastatin  20 mg Oral q1800  . diltiazem  180 mg Oral Daily  . enoxaparin (LOVENOX) injection  30 mg Subcutaneous Q24H  . epoetin alfa  14,000 Units Intravenous Q T,Th,Sa-HD  . folic acid  1 mg Oral Daily  . insulin aspart  0-5 Units Subcutaneous QHS  . insulin aspart  0-9 Units Subcutaneous TID WC  . insulin glargine  10 Units Subcutaneous QHS  . metoprolol tartrate  25 mg Oral 3 times per day  . pantoprazole  40 mg Oral BID  . sevelamer carbonate  800 mg Oral TID WC  . sodium chloride  3 mL Intravenous Q12H  . traZODone  50 mg Oral QHS  . warfarin  2 mg Oral ONCE-1800  .  Warfarin - Pharmacist Dosing Inpatient   Does not apply Q24H   Continuous Infusions:    Principal Problem:   Wound dehiscence Active Problems:   Anemia, chronic disease   Atrial fibrillation   ESRD (end stage renal disease) on dialysis   Right BKA infection   Wound infection

## 2013-10-20 NOTE — Evaluation (Signed)
Physical Therapy Evaluation Patient Details Name: [name] [name] MRN: [id number] DOB: Sep 15, 1950 Today's Date: 10/20/2013 Time: 0998-3382 PT Time Calculation (min): 22 min  PT Assessment / Plan / Recommendation History of Present Illness  Pt was recently seen for tx following right BKA revision.  He returned home at d/c and was doing well, mobility with a w/c or standard walker, until he developed wound dehiscence and required a revision to an AKA.  Clinical Impression   Pt was seen for evaluation.  He is doing well with no pain reported.  Wound closure looks very secure with no drainage.  He is independent with bed mobility and was able to ambulate with a walker for 15'.  He uses a w/c for mobility of any distance so should have no difficulty with transition to home at d/c.  HHPT would be indicated for increasing strength and gait mobility.    PT Assessment  All further PT needs can be met in the next venue of care    Follow Up Recommendations  Home health PT                 Equipment Recommendations  None recommended by PT               Precautions / Restrictions Precautions Precautions: Fall Restrictions Weight Bearing Restrictions: No          Mobility  Bed Mobility Overal bed mobility: Modified Independent Transfers Overall transfer level: Modified independent Equipment used: Standard walker Transfers: Sit to/from Stand Sit to Stand: Supervision Ambulation/Gait Ambulation/Gait assistance: Supervision Ambulation Distance (Feet): 15 Feet Assistive device: Standard walker Gait Pattern/deviations: WFL(Within Functional Limits) Gait velocity: instruction given for proper walker placement in order to have some distance between himself and front of walker...he tends to get too close to it and this decreases his stability         PT Diagnosis: Difficulty walking  PT Problem List: Decreased activity tolerance;Decreased mobility PT Treatment Interventions: Gait  training;Functional mobility training;Therapeutic exercise     PT Goals(Current goals can be found in the care plan section) Acute Rehab PT Goals PT Goal Formulation: No goals set, d/c therapy  Visit Information  History of Present Illness: Pt was recently seen for tx following right BKA revision.  He returned home at d/c and was doing well, mobility with a w/c or standard walker, until he developed wound dehiscence and required a revision to an AKA.       Prior [place] expects to be discharged to:: Private residence Living Arrangements: Spouse/significant other Available Help at Discharge: Family;Available 24 hours/day Type of Home: House Home Access: Ramped entrance Home Layout: One level Home Equipment: Wheelchair - manual;Bedside commode;Walker - standard Prior Function Level of Independence: Independent with assistive device(s) Communication Communication: No difficulties    Cognition  Cognition Arousal/Alertness: Awake/alert Behavior During Therapy: WFL for tasks assessed/performed Overall Cognitive Status: Within Functional Limits for tasks assessed    Extremity/Trunk Assessment Lower Extremity Assessment Lower Extremity Assessment: Overall WFL for tasks assessed   Balance Balance Overall balance assessment: No apparent balance deficits (not formally assessed)  End of Session PT - End of Session Equipment Utilized During Treatment: Gait belt Activity Tolerance: Patient tolerated treatment well Patient left: in chair;with call bell/phone within reach Nurse Communication: Mobility status  GP     [name] 10/20/2013, 10:24 AM

## 2013-10-20 NOTE — Clinical Social Work Note (Signed)
CSW received referral for possible SNF. PT recommending home with home health which pt is agreeable to. CSW will sign off, but can be reconsulted if needed.  Benay Pike, [place]

## 2013-10-20 NOTE — Progress Notes (Signed)
3 Days Post-Op  Subjective: No complaints of significant pain.  Objective: Vital signs in last 24 hours: Temp:  [97.4 F (36.3 C)-98.9 F (37.2 C)] 97.4 F (36.3 C) (02/23 0430) Pulse Rate:  [97-107] 104 (02/23 0430) Resp:  [20] 20 (02/23 0430) BP: (102-123)/(68-81) 115/77 mmHg (02/23 0430) SpO2:  [93 %-98 %] 97 % (02/23 0430) Weight:  [87.8 kg (193 lb 9 oz)] 87.8 kg (193 lb 9 oz) (02/23 0430) Last BM Date: 10/17/13  Intake/Output from previous day: 02/22 0701 - 02/23 0700 In: 380 [P.O.:380] Out: 201 [Urine:200; Stool:1] Intake/Output this shift:    General appearance: alert, cooperative and no distress Extremities: Right above-the-knee amputation site healing well.  Lab Results:   Recent Labs  10/19/13 0515 10/20/13 0610  WBC 10.2 10.9*  HGB 8.9* 9.0*  HCT 29.9* 30.0*  PLT 247 271   BMET  Recent Labs  10/18/13 0459 10/20/13 0610  NA 141 143  K 4.6 4.7  CL 97 100  CO2 28 30  GLUCOSE 123* 117*  BUN 22 22  CREATININE 6.88* 7.04*  CALCIUM 10.0 9.6   PT/INR  Recent Labs  10/19/13 0515 10/20/13 0610  LABPROT 17.8* 20.2*  INR 1.51* 1.78*    Studies/Results: No results found.  Anti-infectives: Anti-infectives   Start     Dose/Rate Route Frequency Ordered Stop   10/14/13 1200  vancomycin (VANCOCIN) IVPB 1000 mg/200 mL premix  Status:  Discontinued     1,000 mg 200 mL/hr over 60 Minutes Intravenous Every T-Th-Sa (Hemodialysis) 10/14/13 1115 10/19/13 1659   10/13/13 2200  vancomycin (VANCOCIN) 1,500 mg in sodium chloride 0.9 % 500 mL IVPB     1,500 mg 250 mL/hr over 120 Minutes Intravenous  Once 10/13/13 2048 10/14/13 0000   10/13/13 2200  piperacillin-tazobactam (ZOSYN) IVPB 2.25 g  Status:  Discontinued     2.25 g 100 mL/hr over 30 Minutes Intravenous 3 times per day 10/13/13 2048 10/19/13 1659      Assessment/Plan: s/p Procedure(s): AMPUTATION ABOVE KNEE Impression: Doing well on postoperative day 3, status post right above-the-knee  amputation. Plan: Will get physical therapy consultation as to further management and treatment. Patient desires to go home, which is fine with me if cleared by PT. No need for ongoing antibiotics for his amputation.  LOS: 7 days    Kymorah Korf A 10/20/2013

## 2013-10-20 NOTE — Progress Notes (Signed)
[place] for Warfarin Indication: atrial fibrillation  Allergies  Allergen Reactions  . Bacitracin Other (See Comments)    unknown  . Norvasc [Amlodipine Besylate] Other (See Comments)    unknown    Patient Measurements: Height: 6\' 2"  (188 cm) Weight: 193 lb 9 oz (87.8 kg) IBW/kg (Calculated) : 82.2  Vital Signs: Temp: 97.4 F (36.3 C) (02/23 0430) Temp src: Oral (02/23 0430) BP: 115/77 mmHg (02/23 0430) Pulse Rate: 104 (02/23 0430)  Labs:  Recent Labs  10/18/13 0459 10/19/13 0515 10/20/13 0610  HGB 9.3* 8.9* 9.0*  HCT 31.1* 29.9* 30.0*  PLT 287 247 271  LABPROT  --  17.8* 20.2*  INR  --  1.51* 1.78*  CREATININE 6.88*  --  7.04*    Estimated Creatinine Clearance: 12.3 ml/min (by C-G formula based on Cr of 7.04).   Medical History: Past Medical History  Diagnosis Date  . UGI bleed 02/12/11    on anticoagulation; EGD w/snare polypectomy-multiple polypoid lesions antrum(benign), Chronic active gastritis (NEGATIVE H pylori)  . Anemia, chronic disease   . Sepsis due to enterococcus 02/11/11  . Atrial fibrillation     coumadin  . CAD (coronary artery disease)     s/p CABGx4  . Cardiomyopathy     40-45% EF 6/12  . Diabetes mellitus   . Hypertension   . Hyperlipemia   . Gout   . S/P patent foramen ovale closure   . History of nuclear stress test 11/2011    negative myoview  . End stage kidney disease     T/TH/SAT dialyis, Dr Hinda Lenis  . Gout   . History of nuclear stress test 07/04/2010    dipyridamole; mod fixed inferolateral defect; non-diagnostic for ischemia; low risk scan   . Hyperbilirubinemia 08/10/2013    Medications:  Scheduled:  . allopurinol  100 mg Oral Daily  . amiodarone  100 mg Oral Daily  . aspirin  81 mg Oral Daily  . atorvastatin  20 mg Oral q1800  . diltiazem  180 mg Oral Daily  . enoxaparin (LOVENOX) injection  30 mg Subcutaneous Q24H  . epoetin alfa  14,000 Units Intravenous Q T,Th,Sa-HD  .  folic acid  1 mg Oral Daily  . insulin aspart  0-5 Units Subcutaneous QHS  . insulin aspart  0-9 Units Subcutaneous TID WC  . insulin glargine  10 Units Subcutaneous QHS  . metoprolol tartrate  25 mg Oral 3 times per day  . pantoprazole  40 mg Oral BID  . sevelamer carbonate  800 mg Oral TID WC  . sodium chloride  3 mL Intravenous Q12H  . traZODone  50 mg Oral QHS  . Warfarin - Pharmacist Dosing Inpatient   Does not apply Q24H    Assessment: 63 yo M on chronic warfarin for Afib.  INR sub-therapeutic on admission.  Patient reported hold dose of 1mg  daily except 1.5mg  on Sat/Sun (8mg /week).  This is a slight discrepancy with anticoag clinic records of 1.25mg  po daily (8.75mg /week).   No bleeding noted.  INR rising to goal.   Goal of Therapy:  INR 2-3 Monitor platelets by anticoagulation protocol: Yes   Plan:  Coumadin 2 mg po today INR/PT daily Monitor platelets, CBC Continue to overlap with Lovenox while hospitalized until [employer] 10/20/2013,8:27 AM

## 2013-10-21 NOTE — Telephone Encounter (Signed)
Spoke with Anderson Malta at [facility] in [place], they are to open the patient home care case today.  They will (weather permitting) draw INR today, gave them my cell phone # to call results to as our office closes at noon due to inclement weather.

## 2013-10-21 NOTE — Telephone Encounter (Signed)
Spoke with Dr. Sarajane Jews, we will take over INR checks, pt has [facility] coming to home, will contact them to give orders for INR draw and results sent to [place].

## 2013-10-22 ENCOUNTER — Ambulatory Visit (INDEPENDENT_AMBULATORY_CARE_PROVIDER_SITE_OTHER): Payer: Medicare Other | Admitting: Pharmacist Clinician (PhC)/ Clinical Pharmacy Specialist

## 2013-10-22 DIAGNOSIS — Z7901 Long term (current) use of anticoagulants: Secondary | ICD-10-CM

## 2013-10-22 DIAGNOSIS — I4891 Unspecified atrial fibrillation: Secondary | ICD-10-CM

## 2013-10-22 LAB — POCT INR: INR: 2

## 2013-10-27 ENCOUNTER — Telehealth: Payer: Self-pay | Admitting: *Deleted

## 2013-10-27 NOTE — Telephone Encounter (Signed)
Faxed order to draw protime today ([name]) - may use anticoag machine - call results to [facility]

## 2013-10-30 ENCOUNTER — Ambulatory Visit (INDEPENDENT_AMBULATORY_CARE_PROVIDER_SITE_OTHER): Payer: Medicare Other | Admitting: Pharmacist Clinician (PhC)/ Clinical Pharmacy Specialist

## 2013-10-30 DIAGNOSIS — I4891 Unspecified atrial fibrillation: Secondary | ICD-10-CM

## 2013-10-30 DIAGNOSIS — Z7901 Long term (current) use of anticoagulants: Secondary | ICD-10-CM

## 2013-10-30 LAB — POCT INR: INR: 1.7

## 2013-11-06 ENCOUNTER — Ambulatory Visit (INDEPENDENT_AMBULATORY_CARE_PROVIDER_SITE_OTHER): Payer: Medicare Other | Admitting: Pharmacist Clinician (PhC)/ Clinical Pharmacy Specialist

## 2013-11-06 DIAGNOSIS — Z7901 Long term (current) use of anticoagulants: Secondary | ICD-10-CM

## 2013-11-06 DIAGNOSIS — I4891 Unspecified atrial fibrillation: Secondary | ICD-10-CM

## 2013-11-06 LAB — POCT INR: INR: 1.1

## 2013-11-10 ENCOUNTER — Telehealth: Payer: Self-pay | Admitting: *Deleted

## 2013-11-10 ENCOUNTER — Ambulatory Visit (INDEPENDENT_AMBULATORY_CARE_PROVIDER_SITE_OTHER): Payer: Medicare Other | Admitting: Pharmacist Clinician (PhC)/ Clinical Pharmacy Specialist

## 2013-11-10 DIAGNOSIS — Z7901 Long term (current) use of anticoagulants: Secondary | ICD-10-CM

## 2013-11-10 DIAGNOSIS — I4891 Unspecified atrial fibrillation: Secondary | ICD-10-CM

## 2013-11-10 LAB — POCT INR: INR: 2.6

## 2013-11-10 NOTE — Telephone Encounter (Signed)
Faxed to [place] order to collect INR - call for dosing

## 2013-11-11 ENCOUNTER — Telehealth: Payer: Self-pay | Admitting: *Deleted

## 2013-11-11 NOTE — Telephone Encounter (Signed)
[facility] to [facility] 

## 2013-11-14 ENCOUNTER — Ambulatory Visit (INDEPENDENT_AMBULATORY_CARE_PROVIDER_SITE_OTHER): Payer: Medicare Other | Admitting: Pharmacist Clinician (PhC)/ Clinical Pharmacy Specialist

## 2013-11-14 ENCOUNTER — Telehealth: Payer: Self-pay | Admitting: Internal Medicine

## 2013-11-14 ENCOUNTER — Telehealth: Payer: Self-pay | Admitting: *Deleted

## 2013-11-14 DIAGNOSIS — Z7901 Long term (current) use of anticoagulants: Secondary | ICD-10-CM

## 2013-11-14 DIAGNOSIS — I4891 Unspecified atrial fibrillation: Secondary | ICD-10-CM

## 2013-11-14 LAB — POCT INR: INR: 3.5

## 2013-11-14 NOTE — Telephone Encounter (Signed)
An[facility] from [facility] called with INR on Mr [name] [name] December 07, 1950 he was 3.5 he's on a 2.5 mg dose take 1/2 tablets mon-wed-fri and 1 tues-thur-sat-sun

## 2013-11-14 NOTE — Telephone Encounter (Signed)
See anticoag note

## 2013-11-14 NOTE — Telephone Encounter (Signed)
See other phone note for today. 

## 2013-11-19 ENCOUNTER — Ambulatory Visit (INDEPENDENT_AMBULATORY_CARE_PROVIDER_SITE_OTHER): Payer: Medicare Other | Admitting: Pharmacist Clinician (PhC)/ Clinical Pharmacy Specialist

## 2013-11-19 DIAGNOSIS — Z7901 Long term (current) use of anticoagulants: Secondary | ICD-10-CM

## 2013-11-19 DIAGNOSIS — I4891 Unspecified atrial fibrillation: Secondary | ICD-10-CM

## 2013-11-19 LAB — POCT INR: INR: 1.6

## 2013-11-19 NOTE — Progress Notes (Signed)
TCS OCT 2015 DX: ADVANCED POLYP

## 2013-11-26 ENCOUNTER — Ambulatory Visit (INDEPENDENT_AMBULATORY_CARE_PROVIDER_SITE_OTHER): Payer: Medicare Other | Admitting: Pharmacist Clinician (PhC)/ Clinical Pharmacy Specialist

## 2013-11-26 DIAGNOSIS — Z7901 Long term (current) use of anticoagulants: Secondary | ICD-10-CM

## 2013-11-26 DIAGNOSIS — I4891 Unspecified atrial fibrillation: Secondary | ICD-10-CM

## 2013-11-26 LAB — POCT INR: INR: 2.4

## 2013-12-02 ENCOUNTER — Telehealth: Payer: Self-pay | Admitting: *Deleted

## 2013-12-02 NOTE — Telephone Encounter (Signed)
Faxed [place] orders r/t INR

## 2013-12-03 ENCOUNTER — Telehealth: Payer: Self-pay | Admitting: *Deleted

## 2013-12-03 NOTE — Telephone Encounter (Signed)
Faxed signed orders r/t PT/INR & coumadin

## 2013-12-10 ENCOUNTER — Ambulatory Visit (INDEPENDENT_AMBULATORY_CARE_PROVIDER_SITE_OTHER): Payer: Medicare Other | Admitting: Pharmacist Clinician (PhC)/ Clinical Pharmacy Specialist

## 2013-12-10 DIAGNOSIS — I4891 Unspecified atrial fibrillation: Secondary | ICD-10-CM

## 2013-12-10 DIAGNOSIS — Z7901 Long term (current) use of anticoagulants: Secondary | ICD-10-CM

## 2013-12-10 LAB — POCT INR: INR: 3.5

## 2013-12-17 ENCOUNTER — Encounter: Payer: Self-pay | Admitting: Cardiology

## 2013-12-17 ENCOUNTER — Ambulatory Visit (INDEPENDENT_AMBULATORY_CARE_PROVIDER_SITE_OTHER): Payer: Medicare Other | Admitting: Cardiology

## 2013-12-17 ENCOUNTER — Ambulatory Visit (INDEPENDENT_AMBULATORY_CARE_PROVIDER_SITE_OTHER): Payer: Medicare Other | Admitting: Pharmacist Clinician (PhC)/ Clinical Pharmacy Specialist

## 2013-12-17 VITALS — BP 122/55 | HR 75 | Ht 76.0 in | Wt 225.0 lb

## 2013-12-17 DIAGNOSIS — I2510 Atherosclerotic heart disease of native coronary artery without angina pectoris: Secondary | ICD-10-CM

## 2013-12-17 DIAGNOSIS — Z954 Presence of other heart-valve replacement: Secondary | ICD-10-CM

## 2013-12-17 DIAGNOSIS — Z992 Dependence on renal dialysis: Secondary | ICD-10-CM

## 2013-12-17 DIAGNOSIS — Z7901 Long term (current) use of anticoagulants: Secondary | ICD-10-CM

## 2013-12-17 DIAGNOSIS — I429 Cardiomyopathy, unspecified: Secondary | ICD-10-CM

## 2013-12-17 DIAGNOSIS — I4891 Unspecified atrial fibrillation: Secondary | ICD-10-CM

## 2013-12-17 DIAGNOSIS — N186 End stage renal disease: Secondary | ICD-10-CM

## 2013-12-17 DIAGNOSIS — Z952 Presence of prosthetic heart valve: Secondary | ICD-10-CM

## 2013-12-17 LAB — POCT INR: INR: 2.8

## 2013-12-17 NOTE — Assessment & Plan Note (Signed)
Status post CABG as outlined above. No recent chest pain symptoms. Has had interval ischemic followup, reportedly nonischemic Cardiolite at [facility] back in 2013, although documentation of cardiomyopathy at that point.

## 2013-12-17 NOTE — Assessment & Plan Note (Signed)
He continues on regular hemodialysis, followed by Dr. Lowanda Foster.

## 2013-12-17 NOTE — Assessment & Plan Note (Signed)
We need to obtain an echocardiogram to followup on cardiac structure and function.

## 2013-12-17 NOTE — Patient Instructions (Signed)
Your physician recommends that you schedule a follow-up appointment in: 4 months with Dr Domenic Polite in the [facility] office  Your physician has requested that you have an echocardiogram. Echocardiography is a painless test that uses sound waves to create images of your heart. It provides your doctor with information about the size and shape of your heart and how well your heart's chambers and valves are working. This procedure takes approximately one hour. There are no restrictions for this procedure.

## 2013-12-17 NOTE — Assessment & Plan Note (Signed)
Bioprosthetic mitral valve replacement for severe mitral regurgitation at thr time of CABG back in May 2008. Followup echocardiogram to be obtained.

## 2013-12-17 NOTE — Assessment & Plan Note (Signed)
Paroxysmal based on history, ECG from January showed sinus rhythm. He has been on present regimen for quite some time and tolerated well by report. Need to clarify who is going to be following Coumadin, will ask Lattie Haw and our Coumadin clinic to investigate this. He would like to follow in our [place] office.

## 2013-12-17 NOTE — Progress Notes (Signed)
Clinical Summary [name] is a medically complex 63 y.o.male referred to establish cardiology followup by Dr. Gerarda [name]. Records indicate previous followup with Dr. Rollene [name], possibly evaluated by Dr. Debara [name] since then. I do not have complete records. It looks like Coumadin has been followed through the [facility] office recently.   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 Servando [name] in May of 2008. He has had paroxysmal atrial fibrillation, continues on Coumadin and amiodarone. Most recent ECG in January of this year showed sinus rhythm.  Hospitalization noted in February related to right BKA wound breakdown with poor healing.  He is here with his wife today. States that he has done generally well, no sense of palpitations recently, no chest pain or progressive shortness of breath. Has been tolerating his hemodialysis on a regular basis. He reports compliance with his medications. No major bleeding problems on Coumadin.  Lexiscan Cardiolite done at [facility] in April 2013 reportedly showed no evidence of ischemia or scar, however global reduction in LVEF at 20% with chamber dilatation. Not certain if he had a followup echocardiogram at that time. I see no recent studies.   Allergies  Allergen Reactions  . Bacitracin Other (See Comments)    unknown  . Norvasc [Amlodipine Besylate] Other (See Comments)    unknown    Current Outpatient Prescriptions  Medication Sig Dispense Refill  . allopurinol (ZYLOPRIM) 100 MG tablet Take 100 mg by mouth daily.       Marland Kitchen amiodarone (PACERONE) 100 MG tablet Take 100 mg by mouth daily.      Marland Kitchen aspirin 81 MG tablet Take 81 mg by mouth daily.       Marland Kitchen atorvastatin (LIPITOR) 20 MG tablet Take 20 mg by mouth daily.       . B Complex-C-Zn-Folic Acid (DIALYVITE/ZINC PO) Take by mouth  3 (three) times a week. Patient has dialysis on Tuesdays, Thursdays, and Saturdays      . diltiazem (CARDIZEM CD) 180 MG 24 hr capsule Take 1 capsule (180 mg total) by mouth daily.  30 capsule  1  . folic acid (FOLVITE) 1 MG tablet Take 1 mg by mouth daily.        . insulin glargine (LANTUS) 100 UNIT/ML injection Inject 10 Units into the skin at bedtime.      . metoprolol tartrate (LOPRESSOR) 25 MG tablet Take 1 tablet (25 mg total) by mouth 3 (three) times daily.      Marland Kitchen omeprazole (PRILOSEC) 20 MG capsule Take 20 mg by mouth daily.      . pantoprazole (PROTONIX) 40 MG tablet Take 40 mg by mouth 2 (two) times daily.       Marland Kitchen RENVELA 800 MG tablet Take 800 mg by mouth 3 (three) times daily with meals.       . traMADol (ULTRAM) 50 MG tablet Take by mouth every 8 (eight) hours as needed for moderate pain.      . traZODone (DESYREL) 50 MG tablet Take 1 tablet (50 mg total) by mouth at bedtime.  30 tablet  0  . warfarin (COUMADIN) 1 MG tablet Take 1-1.5 mg by mouth daily at 6 PM. Takes 1 tablet (1 mg) Monday thru Friday and takes 1.5 tablets (1.5 mg) on Saturday and Sunday       No current facility-administered medications for this visit.  Past Medical History  Diagnosis Date  . UGI bleed 02/12/11    On anticoagulation; EGD w/[name] polypectomy-multiple polypoid lesions antrum(benign), Chronic active gastritis (NEGATIVE H pylori)  . Anemia, chronic disease   . Sepsis due to enterococcus 02/11/11  . Atrial fibrillation     On coumadin  . Coronary atherosclerosis of native coronary artery     Multivessel status post CABG  . Cardiomyopathy     LVEF 40-45% 01/2011  . Type 2 diabetes mellitus   . Essential hypertension, benign   . Hyperlipemia   . Gout   . S/P patent foramen ovale closure   . ESRD on hemodialysis     T/TH/SAT dialyis - Dr Hinda Lenis  . History of nuclear stress test 07/04/2010    dipyridamole; mod fixed inferolateral defect; non-diagnostic for ischemia; low risk scan   .  Hyperbilirubinemia 08/10/2013    Past Surgical History  Procedure Laterality Date  . Mitral valve replacement  01/05/2007    Bioprosthetic 19mm Edwards precordial tissue (Dr. Servando [name])  . Coronary artery bypass graft  01/05/2007    LIMA-LAD, SVG-OM, seq. SVG-PDA, PLA-RCA  . Colonoscopy  06/23/2011    Dr. Oneida Alar: multiple sessile and pedunculated polyps, moderate diverticulosis, internal hemorrhoids, +ADENOMATOUS POLYPS, consider genetic testing, surveillance in October 2015   . Esophagogastroduodenoscopy  02/12/2011    CHRONIC ACTIVE GASTRITIS, NO H. pylori  . Transthoracic echocardiogram  11/2011    EF 25% w/ mod dilatation of LV & mod LVH; LA severely dilated; mod-severe dilation of RV with mod-severe decrease in RV function; mild MR & TR  . Cardioversion  04/19/2007    Dr. Marella Chimes  . Transthoracic echocardiogram  02/14/2011    EF 40-45%, mod conc LVH; ventricular septum with motion showing abnormal function, dyssynergy, paradox; mild AS; mild-mod MR stenosis; LA severely dilated; aptrial septum bowed from L to R with increased LA pressure   . Amputation Right 07/14/2013    Procedure: AMPUTATION DIGIT;  Surgeon: [name], MD;  Location: [facility];  Service: General;  Laterality: Right;  . Amputation Right 08/08/2013    Procedure: AMPUTATION BELOW KNEE;  Surgeon: [name], MD;  Location: [facility];  Service: General;  Laterality: Right;  . Stump revision Right 09/19/2013    Procedure: STUMP REVISION;  Surgeon: Scherry Ran, MD;  Location: [facility];  Service: General;  Laterality: Right;  . Amputation Right 10/17/2013    Procedure: AMPUTATION ABOVE KNEE;  Surgeon: [name], MD;  Location: [facility];  Service: General;  Laterality: Right;    Family History  Problem Relation Age of Onset  . Diabetes Mother   . Diabetes Father   . Diabetes Sister   . Colon cancer Neg Hx     Social History [name] reports that he quit smoking about 7 years ago. His smoking use included  Cigarettes. He has a 8 pack-year smoking history. He has never used smokeless tobacco. Mr. Whyte reports that he does not drink alcohol.  Review of Systems The patient is not particularly active, uses a wheelchair much of the time. No regular exercise regimen. Otherwise as outlined.  Physical Examination Filed Vitals:   12/17/13 1343  BP: 122/55  Pulse: 75   Filed Weights   12/17/13 1343  Weight: 225 lb (102.059 kg)   The patient appears comfortable at rest, seated in wheelchair. HEENT: Conjunctiva and lids normal, oropharynx clear. Neck: Supple, no elevated JVP, no thyromegaly. Lungs: Clear to auscultation, nonlabored breathing at rest. Cardiac: Regular  rate and rhythm, S4, soft systolic murmur, no pericardial rub. Abdomen: Soft, nontender, bowel sounds present. Extremities: No pitting edema, status post right AKA. Dialysis fistula in the right arm. Skin: Warm and dry. Musculoskeletal: No kyphosis. Neuropsychiatric: Alert and oriented x3, affect grossly appropriate.   Problem List and Plan   Atrial fibrillation Paroxysmal based on history, ECG from January showed sinus rhythm. He has been on present regimen for quite some time and tolerated well by report. Need to clarify who is going to be following Coumadin, will ask Lattie Haw and our Coumadin clinic to investigate this. He would like to follow in our [place] office.  Coronary atherosclerosis of native coronary artery Status post CABG as outlined above. No recent chest pain symptoms. Has had interval ischemic followup, reportedly nonischemic Cardiolite at [facility] back in 2013, although documentation of cardiomyopathy at that point.  Secondary cardiomyopathy We need to obtain an echocardiogram to followup on cardiac structure and function.  H/O mitral valve replacement Bioprosthetic mitral valve replacement for severe mitral regurgitation at thr time of CABG back in May 2008. Followup echocardiogram to be obtained.  ESRD (end  stage renal disease) on dialysis He continues on regular hemodialysis, followed by Dr. Lowanda Foster.    Satira Sark, M.D., F.A.C.C.

## 2013-12-24 ENCOUNTER — Ambulatory Visit (HOSPITAL_COMMUNITY)
Admission: RE | Admit: 2013-12-24 | Discharge: 2013-12-24 | Disposition: A | Discharge status: Home / Self Care | Payer: Medicare Other | Source: Ambulatory Visit | Attending: Cardiology | Admitting: Cardiology

## 2013-12-24 DIAGNOSIS — I4891 Unspecified atrial fibrillation: Secondary | ICD-10-CM | POA: Insufficient documentation

## 2013-12-24 DIAGNOSIS — Z954 Presence of other heart-valve replacement: Secondary | ICD-10-CM | POA: Insufficient documentation

## 2013-12-24 DIAGNOSIS — I517 Cardiomegaly: Secondary | ICD-10-CM

## 2013-12-24 DIAGNOSIS — Z951 Presence of aortocoronary bypass graft: Secondary | ICD-10-CM | POA: Insufficient documentation

## 2013-12-24 DIAGNOSIS — Z992 Dependence on renal dialysis: Secondary | ICD-10-CM | POA: Insufficient documentation

## 2013-12-24 DIAGNOSIS — I2510 Atherosclerotic heart disease of native coronary artery without angina pectoris: Secondary | ICD-10-CM | POA: Insufficient documentation

## 2013-12-24 DIAGNOSIS — E785 Hyperlipidemia, unspecified: Secondary | ICD-10-CM | POA: Insufficient documentation

## 2013-12-24 DIAGNOSIS — E119 Type 2 diabetes mellitus without complications: Secondary | ICD-10-CM | POA: Insufficient documentation

## 2013-12-24 DIAGNOSIS — I429 Cardiomyopathy, unspecified: Secondary | ICD-10-CM

## 2013-12-24 DIAGNOSIS — N19 Unspecified kidney failure: Secondary | ICD-10-CM | POA: Insufficient documentation

## 2013-12-24 DIAGNOSIS — Z952 Presence of prosthetic heart valve: Secondary | ICD-10-CM

## 2013-12-24 NOTE — Progress Notes (Signed)
  Echocardiogram 2D Echocardiogram has been performed.  [name] [name] 12/24/2013, 10:26 AM

## 2013-12-26 ENCOUNTER — Telehealth: Payer: Self-pay | Admitting: *Deleted

## 2013-12-26 NOTE — Telephone Encounter (Signed)
[facility] to [facility] 

## 2014-01-02 ENCOUNTER — Telehealth: Payer: Self-pay | Admitting: *Deleted

## 2014-01-02 ENCOUNTER — Ambulatory Visit (INDEPENDENT_AMBULATORY_CARE_PROVIDER_SITE_OTHER): Payer: Medicare Other | Admitting: *Deleted

## 2014-01-02 DIAGNOSIS — I4891 Unspecified atrial fibrillation: Secondary | ICD-10-CM

## 2014-01-02 DIAGNOSIS — Z7901 Long term (current) use of anticoagulants: Secondary | ICD-10-CM

## 2014-01-02 DIAGNOSIS — Z5181 Encounter for therapeutic drug level monitoring: Secondary | ICD-10-CM

## 2014-01-02 LAB — POCT INR: INR: 3.1

## 2014-01-02 NOTE — Telephone Encounter (Signed)
Faxed [facility] order r/t PT/INR

## 2014-01-23 ENCOUNTER — Ambulatory Visit (INDEPENDENT_AMBULATORY_CARE_PROVIDER_SITE_OTHER): Payer: Medicare Other | Admitting: *Deleted

## 2014-01-23 DIAGNOSIS — Z5181 Encounter for therapeutic drug level monitoring: Secondary | ICD-10-CM

## 2014-01-23 DIAGNOSIS — Z7901 Long term (current) use of anticoagulants: Secondary | ICD-10-CM

## 2014-01-23 DIAGNOSIS — I4891 Unspecified atrial fibrillation: Secondary | ICD-10-CM

## 2014-01-23 LAB — POCT INR: INR: 2.5

## 2014-01-26 DIAGNOSIS — K56609 Unspecified intestinal obstruction, unspecified as to partial versus complete obstruction: Secondary | ICD-10-CM

## 2014-01-26 DIAGNOSIS — A0472 Enterocolitis due to Clostridium difficile, not specified as recurrent: Secondary | ICD-10-CM

## 2014-01-26 DIAGNOSIS — K6389 Other specified diseases of intestine: Secondary | ICD-10-CM

## 2014-01-26 HISTORY — DX: Other specified diseases of intestine: K63.89

## 2014-01-26 HISTORY — DX: Unspecified intestinal obstruction, unspecified as to partial versus complete obstruction: K56.609

## 2014-01-26 HISTORY — DX: Enterocolitis due to Clostridium difficile, not specified as recurrent: A04.72

## 2014-01-27 ENCOUNTER — Other Ambulatory Visit: Payer: Self-pay | Admitting: Pharmacist Clinician (PhC)/ Clinical Pharmacy Specialist

## 2014-01-27 MED ORDER — WARFARIN SODIUM 2.5 MG PO TABS: 2.5000 mg | ORAL_TABLET | Freq: Every day | ORAL | Status: DC

## 2014-02-13 ENCOUNTER — Other Ambulatory Visit (HOSPITAL_COMMUNITY): Payer: Self-pay | Admitting: Internal Medicine

## 2014-02-13 ENCOUNTER — Emergency Department (HOSPITAL_COMMUNITY): Payer: Medicare Other

## 2014-02-13 ENCOUNTER — Inpatient Hospital Stay (HOSPITAL_COMMUNITY)
Admission: EM | Admit: 2014-02-13 | Discharge: 2014-02-21 | DRG: 388 | Disposition: A | Discharge status: Home / Self Care | Payer: Medicare Other | Attending: Internal Medicine | Admitting: Internal Medicine

## 2014-02-13 ENCOUNTER — Encounter (HOSPITAL_COMMUNITY): Payer: Self-pay | Admitting: Emergency Medicine

## 2014-02-13 DIAGNOSIS — R918 Other nonspecific abnormal finding of lung field: Secondary | ICD-10-CM | POA: Diagnosis present

## 2014-02-13 DIAGNOSIS — I2589 Other forms of chronic ischemic heart disease: Secondary | ICD-10-CM | POA: Diagnosis present

## 2014-02-13 DIAGNOSIS — J690 Pneumonitis due to inhalation of food and vomit: Secondary | ICD-10-CM

## 2014-02-13 DIAGNOSIS — I480 Paroxysmal atrial fibrillation: Secondary | ICD-10-CM

## 2014-02-13 DIAGNOSIS — K219 Gastro-esophageal reflux disease without esophagitis: Secondary | ICD-10-CM | POA: Diagnosis present

## 2014-02-13 DIAGNOSIS — Z79899 Other long term (current) drug therapy: Secondary | ICD-10-CM

## 2014-02-13 DIAGNOSIS — R000 Tachycardia, unspecified: Secondary | ICD-10-CM | POA: Diagnosis present

## 2014-02-13 DIAGNOSIS — I120 Hypertensive chronic kidney disease with stage 5 chronic kidney disease or end stage renal disease: Secondary | ICD-10-CM | POA: Diagnosis present

## 2014-02-13 DIAGNOSIS — R6520 Severe sepsis without septic shock: Secondary | ICD-10-CM

## 2014-02-13 DIAGNOSIS — Z992 Dependence on renal dialysis: Secondary | ICD-10-CM

## 2014-02-13 DIAGNOSIS — M109 Gout, unspecified: Secondary | ICD-10-CM | POA: Diagnosis present

## 2014-02-13 DIAGNOSIS — I4891 Unspecified atrial fibrillation: Secondary | ICD-10-CM

## 2014-02-13 DIAGNOSIS — Z951 Presence of aortocoronary bypass graft: Secondary | ICD-10-CM

## 2014-02-13 DIAGNOSIS — N2581 Secondary hyperparathyroidism of renal origin: Secondary | ICD-10-CM | POA: Diagnosis present

## 2014-02-13 DIAGNOSIS — A419 Sepsis, unspecified organism: Secondary | ICD-10-CM

## 2014-02-13 DIAGNOSIS — K56609 Unspecified intestinal obstruction, unspecified as to partial versus complete obstruction: Principal | ICD-10-CM

## 2014-02-13 DIAGNOSIS — E875 Hyperkalemia: Secondary | ICD-10-CM

## 2014-02-13 DIAGNOSIS — N186 End stage renal disease: Secondary | ICD-10-CM | POA: Diagnosis present

## 2014-02-13 DIAGNOSIS — K6389 Other specified diseases of intestine: Secondary | ICD-10-CM

## 2014-02-13 DIAGNOSIS — E785 Hyperlipidemia, unspecified: Secondary | ICD-10-CM | POA: Diagnosis present

## 2014-02-13 DIAGNOSIS — Z7901 Long term (current) use of anticoagulants: Secondary | ICD-10-CM

## 2014-02-13 DIAGNOSIS — Z87891 Personal history of nicotine dependence: Secondary | ICD-10-CM

## 2014-02-13 DIAGNOSIS — R6521 Severe sepsis with septic shock: Secondary | ICD-10-CM

## 2014-02-13 DIAGNOSIS — R109 Unspecified abdominal pain: Secondary | ICD-10-CM

## 2014-02-13 DIAGNOSIS — Z7982 Long term (current) use of aspirin: Secondary | ICD-10-CM

## 2014-02-13 DIAGNOSIS — E119 Type 2 diabetes mellitus without complications: Secondary | ICD-10-CM | POA: Diagnosis present

## 2014-02-13 DIAGNOSIS — Z794 Long term (current) use of insulin: Secondary | ICD-10-CM

## 2014-02-13 DIAGNOSIS — I429 Cardiomyopathy, unspecified: Secondary | ICD-10-CM

## 2014-02-13 DIAGNOSIS — E1122 Type 2 diabetes mellitus with diabetic chronic kidney disease: Secondary | ICD-10-CM

## 2014-02-13 DIAGNOSIS — D638 Anemia in other chronic diseases classified elsewhere: Secondary | ICD-10-CM

## 2014-02-13 DIAGNOSIS — A0472 Enterocolitis due to Clostridium difficile, not specified as recurrent: Secondary | ICD-10-CM

## 2014-02-13 DIAGNOSIS — E869 Volume depletion, unspecified: Secondary | ICD-10-CM | POA: Diagnosis present

## 2014-02-13 DIAGNOSIS — N19 Unspecified kidney failure: Secondary | ICD-10-CM

## 2014-02-13 DIAGNOSIS — J9383 Other pneumothorax: Secondary | ICD-10-CM | POA: Diagnosis present

## 2014-02-13 DIAGNOSIS — I2510 Atherosclerotic heart disease of native coronary artery without angina pectoris: Secondary | ICD-10-CM

## 2014-02-13 DIAGNOSIS — J939 Pneumothorax, unspecified: Secondary | ICD-10-CM

## 2014-02-13 DIAGNOSIS — I509 Heart failure, unspecified: Secondary | ICD-10-CM | POA: Diagnosis present

## 2014-02-13 DIAGNOSIS — I482 Chronic atrial fibrillation, unspecified: Secondary | ICD-10-CM

## 2014-02-13 DIAGNOSIS — I5022 Chronic systolic (congestive) heart failure: Secondary | ICD-10-CM

## 2014-02-13 DIAGNOSIS — E872 Acidosis, unspecified: Secondary | ICD-10-CM | POA: Diagnosis present

## 2014-02-13 DIAGNOSIS — S78119A Complete traumatic amputation at level between unspecified hip and knee, initial encounter: Secondary | ICD-10-CM

## 2014-02-13 DIAGNOSIS — Z954 Presence of other heart-valve replacement: Secondary | ICD-10-CM

## 2014-02-13 LAB — CBC WITH DIFFERENTIAL/PLATELET
Basophils Absolute: 0 10*3/uL (ref 0.0–0.1)
Basophils Relative: 0 % (ref 0–1)
EOS PCT: 6 % — AB (ref 0–5)
Eosinophils Absolute: 0.5 10*3/uL (ref 0.0–0.7)
HCT: 40.3 % (ref 39.0–52.0)
Hemoglobin: 13.1 g/dL (ref 13.0–17.0)
LYMPHS ABS: 1.1 10*3/uL (ref 0.7–4.0)
LYMPHS PCT: 11 % — AB (ref 12–46)
MCH: 30.1 pg (ref 26.0–34.0)
MCHC: 32.5 g/dL (ref 30.0–36.0)
MCV: 92.6 fL (ref 78.0–100.0)
Monocytes Absolute: 0.9 10*3/uL (ref 0.1–1.0)
Monocytes Relative: 9 % (ref 3–12)
Neutro Abs: 7.3 10*3/uL (ref 1.7–7.7)
Neutrophils Relative %: 74 % (ref 43–77)
Platelets: 239 10*3/uL (ref 150–400)
RBC: 4.35 MIL/uL (ref 4.22–5.81)
RDW: 16.2 % — AB (ref 11.5–15.5)
WBC: 9.7 10*3/uL (ref 4.0–10.5)

## 2014-02-13 LAB — BLOOD GAS, ARTERIAL
ACID-BASE DEFICIT: 1.6 mmol/L (ref 0.0–2.0)
BICARBONATE: 22.4 meq/L (ref 20.0–24.0)
Drawn by: 317771
O2 Content: 2 L/min
O2 SAT: 89.9 %
PATIENT TEMPERATURE: 37
TCO2: 20.4 mmol/L (ref 0–100)
pCO2 arterial: 36.5 mmHg (ref 35.0–45.0)
pH, Arterial: 7.406 (ref 7.350–7.450)
pO2, Arterial: 65.4 mmHg — ABNORMAL LOW (ref 80.0–100.0)

## 2014-02-13 LAB — COMPREHENSIVE METABOLIC PANEL
ALT: 19 U/L (ref 0–53)
AST: 28 U/L (ref 0–37)
Albumin: 2.7 g/dL — ABNORMAL LOW (ref 3.5–5.2)
Alkaline Phosphatase: 95 U/L (ref 39–117)
BUN: 54 mg/dL — ABNORMAL HIGH (ref 6–23)
CALCIUM: 10.8 mg/dL — AB (ref 8.4–10.5)
CO2: 24 meq/L (ref 19–32)
CREATININE: 9.42 mg/dL — AB (ref 0.50–1.35)
Chloride: 91 mEq/L — ABNORMAL LOW (ref 96–112)
GFR, EST AFRICAN AMERICAN: 6 mL/min — AB (ref >90)
GFR, EST NON AFRICAN AMERICAN: 5 mL/min — AB (ref >90)
GLUCOSE: 199 mg/dL — AB (ref 70–99)
Potassium: 5.7 mEq/L — ABNORMAL HIGH (ref 3.7–5.3)
Sodium: 140 mEq/L (ref 137–147)
Total Bilirubin: 2.5 mg/dL — ABNORMAL HIGH (ref 0.3–1.2)
Total Protein: 8.2 g/dL (ref 6.0–8.3)

## 2014-02-13 LAB — LIPASE, BLOOD: LIPASE: 39 U/L (ref 11–59)

## 2014-02-13 LAB — LACTIC ACID, PLASMA: LACTIC ACID, VENOUS: 4.6 mmol/L — AB (ref 0.5–2.2)

## 2014-02-13 LAB — TROPONIN I

## 2014-02-13 LAB — PROTIME-INR
INR: 6.15 — AB (ref 0.00–1.49)
PROTHROMBIN TIME: 52 s — AB (ref 11.6–15.2)

## 2014-02-13 LAB — CBG MONITORING, ED: Glucose-Capillary: 238 mg/dL — ABNORMAL HIGH (ref 70–99)

## 2014-02-13 MED ORDER — ONDANSETRON HCL 4 MG/2ML IJ SOLN
4.0000 mg | Freq: Once | INTRAMUSCULAR | Status: AC
  Administered 2014-02-13: 4 mg via INTRAVENOUS

## 2014-02-13 MED ORDER — PIPERACILLIN-TAZOBACTAM 3.375 G IVPB: 3.3750 g | Freq: Once | INTRAVENOUS | Status: DC

## 2014-02-13 MED ORDER — VANCOMYCIN HCL 10 G IV SOLR
1500.0000 mg | Freq: Once | INTRAVENOUS | Status: AC
  Administered 2014-02-13: 1500 mg via INTRAVENOUS
  Filled 2014-02-13: qty 1500

## 2014-02-13 MED ORDER — IOHEXOL 300 MG/ML  SOLN: 100.0000 mL | Freq: Once | INTRAMUSCULAR | Status: AC | PRN

## 2014-02-13 MED ORDER — SODIUM CHLORIDE 0.9 % IV BOLUS (SEPSIS)
250.0000 mL | Freq: Once | INTRAVENOUS | Status: AC
  Administered 2014-02-13: 250 mL via INTRAVENOUS

## 2014-02-13 MED ORDER — VANCOMYCIN HCL IN DEXTROSE 1-5 GM/200ML-% IV SOLN
INTRAVENOUS | Status: AC
  Filled 2014-02-13: qty 200

## 2014-02-13 MED ORDER — LEVOFLOXACIN IN D5W 750 MG/150ML IV SOLN
750.0000 mg | Freq: Once | INTRAVENOUS | Status: AC
  Administered 2014-02-13: 750 mg via INTRAVENOUS
  Filled 2014-02-13: qty 150

## 2014-02-13 MED ORDER — SODIUM CHLORIDE 0.9 % IV BOLUS (SEPSIS)
750.0000 mL | Freq: Once | INTRAVENOUS | Status: AC
  Administered 2014-02-13: 750 mL via INTRAVENOUS

## 2014-02-13 MED ORDER — DEXTROSE 50 % IV SOLN
INTRAVENOUS | Status: AC
  Administered 2014-02-13: 50 mL via INTRAVENOUS
  Filled 2014-02-13: qty 50

## 2014-02-13 MED ORDER — ONDANSETRON HCL 4 MG/2ML IJ SOLN
4.0000 mg | Freq: Once | INTRAMUSCULAR | Status: AC
  Administered 2014-02-13: 4 mg via INTRAVENOUS
  Filled 2014-02-13: qty 2

## 2014-02-13 MED ORDER — INSULIN ASPART 100 UNIT/ML ~~LOC~~ SOLN
SUBCUTANEOUS | Status: AC
  Administered 2014-02-13: 10 [IU] via INTRAVENOUS
  Filled 2014-02-13: qty 1

## 2014-02-13 MED ORDER — ONDANSETRON HCL 4 MG/2ML IJ SOLN: 4.0000 mg | Freq: Once | INTRAMUSCULAR | Status: DC

## 2014-02-13 MED ORDER — DEXTROSE 50 % IV SOLN
50.0000 mL | Freq: Once | INTRAVENOUS | Status: AC
  Administered 2014-02-13: 50 mL via INTRAVENOUS

## 2014-02-13 MED ORDER — IOHEXOL 300 MG/ML  SOLN: 50.0000 mL | Freq: Once | INTRAMUSCULAR | Status: AC | PRN

## 2014-02-13 MED ORDER — ONDANSETRON HCL 4 MG/2ML IJ SOLN
INTRAMUSCULAR | Status: AC
  Filled 2014-02-13: qty 2

## 2014-02-13 MED ORDER — INSULIN ASPART 100 UNIT/ML IV SOLN
10.0000 [IU] | Freq: Once | INTRAVENOUS | Status: AC
  Administered 2014-02-13: 10 [IU] via INTRAVENOUS

## 2014-02-13 NOTE — ED Provider Notes (Addendum)
CSN: [id number]     Arrival date & time 02/13/14  1825 History   First MD Initiated Contact with Patient 02/13/14 1925     No chief complaint on file.    (Consider location/radiation/quality/duration/timing/severity/associated sxs/prior Treatment) HPI Comments: Patient presents to the ER for evaluation of abdominal pain and vomiting. Patient started having lower abdominal pain 3 days ago. Pain has worsened. He started having nausea and vomiting last night. Went to his primary care doctor today and was told he might have diverticulitis, was started on Cipro. He was scheduled for outpatient CAT scan, but he has had acute worsening of his pain and vomiting, presented to the ER for further evaluation.   Past Medical History  Diagnosis Date  . UGI bleed 02/12/11    On anticoagulation; EGD w/snare polypectomy-multiple polypoid lesions antrum(benign), Chronic active gastritis (NEGATIVE H pylori)  . Anemia, chronic disease   . Sepsis due to enterococcus 02/11/11  . Atrial fibrillation     On coumadin  . Coronary atherosclerosis of native coronary artery     Multivessel status post CABG  . Cardiomyopathy     LVEF 40-45% 01/2011  . Type 2 diabetes mellitus   . Essential hypertension, benign   . Hyperlipemia   . Gout   . S/P patent foramen ovale closure   . ESRD on hemodialysis     T/TH/SAT dialyis - Dr Hinda Lenis  . History of nuclear stress test 07/04/2010    dipyridamole; mod fixed inferolateral defect; non-diagnostic for ischemia; low risk scan   . Hyperbilirubinemia 08/10/2013   Past Surgical History  Procedure Laterality Date  . Mitral valve replacement  01/05/2007    Bioprosthetic 18mm Edwards precordial tissue (Dr. Servando Snare)  . Coronary artery bypass graft  01/05/2007    LIMA-LAD, SVG-OM, seq. SVG-PDA, PLA-RCA  . Colonoscopy  06/23/2011    Dr. Oneida Alar: multiple sessile and pedunculated polyps, moderate diverticulosis, internal hemorrhoids, +ADENOMATOUS POLYPS, consider genetic testing,  surveillance in October 2015   . Esophagogastroduodenoscopy  02/12/2011    CHRONIC ACTIVE GASTRITIS, NO H. pylori  . Transthoracic echocardiogram  11/2011    EF 25% w/ mod dilatation of LV & mod LVH; LA severely dilated; mod-severe dilation of RV with mod-severe decrease in RV function; mild MR & TR  . Cardioversion  04/19/2007    Dr. Marella Chimes  . Transthoracic echocardiogram  02/14/2011    EF 40-45%, mod conc LVH; ventricular septum with motion showing abnormal function, dyssynergy, paradox; mild AS; mild-mod MR stenosis; LA severely dilated; aptrial septum bowed from L to R with increased LA pressure   . Amputation Right 07/14/2013    Procedure: AMPUTATION DIGIT;  Surgeon: [name], MD;  Location: [facility];  Service: General;  Laterality: Right;  . Amputation Right 08/08/2013    Procedure: AMPUTATION BELOW KNEE;  Surgeon: [name], MD;  Location: [facility];  Service: General;  Laterality: Right;  . Stump revision Right 09/19/2013    Procedure: STUMP REVISION;  Surgeon: Scherry Ran, MD;  Location: [facility];  Service: General;  Laterality: Right;  . Amputation Right 10/17/2013    Procedure: AMPUTATION ABOVE KNEE;  Surgeon: [name], MD;  Location: [facility];  Service: General;  Laterality: Right;   Family History  Problem Relation Age of Onset  . Diabetes Mother   . Diabetes Father   . Diabetes Sister   . Colon cancer Neg Hx    History  Substance Use Topics  . Smoking status: Former Smoker -- 1.00  packs/day for 8 years    Types: Cigarettes    Quit date: 11/27/2006  . Smokeless tobacco: Never Used     Comment: quit about 5 yrs  . Alcohol Use: No    Review of Systems  Gastrointestinal: Positive for nausea, vomiting and abdominal pain.  All other systems reviewed and are negative.     Allergies  Bacitracin and Norvasc  Home Medications   Prior to Admission medications   Medication Sig Start Date End Date Taking? Authorizing Provider  allopurinol (ZYLOPRIM)  100 MG tablet Take 100 mg by mouth daily.  05/15/11  Yes Historical Provider, MD  amiodarone (PACERONE) 100 MG tablet Take 100 mg by mouth daily.   Yes Historical Provider, MD  aspirin EC 81 MG tablet Take 81 mg by mouth every evening.   Yes Historical Provider, MD  atorvastatin (LIPITOR) 20 MG tablet Take 20 mg by mouth every evening.  02/07/13  Yes Rebecca Eaton, MD  B Complex-C-Zn-Folic Acid (DIALYVITE/ZINC PO) Take by mouth 3 (three) times a week. Patient has dialysis on Tuesdays, Thursdays, and Saturdays   Yes Historical Provider, MD  diltiazem (CARDIZEM CD) 180 MG 24 hr capsule Take 1 capsule (180 mg total) by mouth daily. 07/18/13  Yes Kathie Dike, MD  folic acid (FOLVITE) 1 MG tablet Take 1 mg by mouth daily.     Yes Historical Provider, MD  insulin glargine (LANTUS) 100 UNIT/ML injection Inject 10 Units into the skin at bedtime.   Yes Historical Provider, MD  metoprolol tartrate (LOPRESSOR) 25 MG tablet Take 1 tablet (25 mg total) by mouth 3 (three) times daily. 08/13/13  Yes Nita Sells, MD  pantoprazole (PROTONIX) 40 MG tablet Take 40 mg by mouth 2 (two) times daily.  05/25/11  Yes Historical Provider, MD  RENVELA 800 MG tablet Take 800 mg by mouth 3 (three) times daily with meals.  05/03/11  Yes Historical Provider, MD  SENSIPAR 30 MG tablet Take 30 mg by mouth daily. 01/22/14  Yes Historical Provider, MD  traMADol (ULTRAM) 50 MG tablet Take by mouth every 8 (eight) hours as needed for moderate pain.   Yes Historical Provider, MD  traZODone (DESYREL) 50 MG tablet Take 1 tablet (50 mg total) by mouth at bedtime. 08/13/13  Yes Nita Sells, MD  warfarin (COUMADIN) 5 MG tablet Take 2.5-5 mg by mouth See admin instructions. TAKES ONE HALF TABLET EVERY DAY EXCEPT TAKE ONE-HALF TABLET ON WEDNESDAYS   Yes Historical Provider, MD  ciprofloxacin (CIPRO) 500 MG tablet Take 500 mg by mouth daily. 02/13/14   Historical Provider, MD   BP 82/70  Pulse 110  Resp 23  SpO2  90% Physical Exam  Constitutional: He is oriented to person, place, and time. He appears well-developed and well-nourished. No distress.  HENT:  Head: Normocephalic and atraumatic.  Right Ear: Hearing normal.  Left Ear: Hearing normal.  Nose: Nose normal.  Mouth/Throat: Oropharynx is clear and moist and mucous membranes are normal.  Eyes: Conjunctivae and EOM are normal. Pupils are equal, round, and reactive to light.  Neck: Normal range of motion. Neck supple.  Cardiovascular: Regular rhythm, S1 normal and S2 normal.  Exam reveals no gallop and no friction rub.   No murmur heard. Pulmonary/Chest: Effort normal and breath sounds normal. No respiratory distress. He exhibits no tenderness.  Abdominal: Soft. Normal appearance. He exhibits distension. Bowel sounds are decreased. There is no hepatosplenomegaly. There is generalized tenderness. There is no rebound, no guarding, no tenderness at McBurney's point and  negative Murphy's sign. No hernia.  Musculoskeletal: Normal range of motion.  Neurological: He is alert and oriented to person, place, and time. He has normal strength. No cranial nerve deficit or sensory deficit. Coordination normal. GCS eye subscore is 4. GCS verbal subscore is 5. GCS motor subscore is 6.  Skin: Skin is warm, dry and intact. No rash noted. No cyanosis.  Psychiatric: He has a normal mood and affect. His speech is normal and behavior is normal. Thought content normal.    ED Course  Procedures (including critical care time) Labs Review Labs Reviewed  CBC WITH DIFFERENTIAL - Abnormal; Notable for the following:    RDW 16.2 (*)    Lymphocytes Relative 11 (*)    Eosinophils Relative 6 (*)    All other components within normal limits  COMPREHENSIVE METABOLIC PANEL - Abnormal; Notable for the following:    Potassium 5.7 (*)    Chloride 91 (*)    Glucose, Bld 199 (*)    BUN 54 (*)    Creatinine, Ser 9.42 (*)    Calcium 10.8 (*)    Albumin 2.7 (*)    Total  Bilirubin 2.5 (*)    GFR calc non Af Amer 5 (*)    GFR calc Af Amer 6 (*)    All other components within normal limits  PROTIME-INR - Abnormal; Notable for the following:    Prothrombin Time 52.0 (*)    INR 6.15 (*)    All other components within normal limits  TROPONIN I  LIPASE, BLOOD  LACTIC ACID, PLASMA  BLOOD GAS, ARTERIAL    Imaging Review Ct Abdomen Pelvis W Contrast  02/13/2014   CLINICAL DATA:  Abdominal pain, end-stage renal disease  EXAM: CT ABDOMEN AND PELVIS WITH CONTRAST  TECHNIQUE: Multidetector CT imaging of the abdomen and pelvis was performed using the standard protocol following bolus administration of intravenous contrast.  CONTRAST:  100 mL Isovue  COMPARISON:  CT chest 12/06/2011  FINDINGS: There is a chronic right pleural effusion with associated dense right basilar atelectasis within air bronchograms. Cannot exclude right lower lobe pneumonia. There is a small pneumothorax anterior to the right middle lobe. No pericardial fluid. Mild airspace disease in the lower left lower lobe could represent pneumonia or aspiration pneumonitis.  No focal hepatic lesion. The gallbladder, pancreas, spleen, adrenal glands, are normal. The kidneys show minimal enhancement. Cyst of the left kidney.  The stomach contains a moderate amount of oral contrast. The duodenum and jejunum are dilated with poor progression of the oral contrast. Proximal small bowel loops measure 4 cm. The most distal small bowel is near completely collapsed in the pelvis (images 78, series 2). At discrete transition point is not identified. There is gas within the wall of the cecum tip as seen on coronal image 51, series 4. The appendix appears normal. The ascending colon is collapsed. The transverse and descending colon are collapsed.  Abdominal aorta is heavily calcified. No retroperitoneal periportal lymphadenopathy.  No free fluid the pelvis. The prostate gland and bladder normal. No pelvic lymphadenopathy. There is  bilateral inguinal hernias. No aggressive osseous lesion. There is chronic appearing compression fracture of the L1 vertebral body.  IMPRESSION: 1. Chronic right pleural effusion with associated right lower lobe atelectasis. Cannot exclude right lower lobe pneumonia. 2. Small pneumothorax is noted anterior to the right middle lobe. Recommend chest radiograph to exclude a larger pneumothorax. 3. Pneumonitis versus pneumonia at the left lung base. Consider aspiration. 4. Small bowel obstruction pattern with  change in caliber from the proximal small bowel to the distal small bowel with a poor progression oral contrast. No transition point identified. 5. Pneumatosis of the tip of the cecum. This could be a benign pneumatosis but cannot exclude ischemia. 6. Appendix is normal. Findings conveyed toCHRISTOPHER Aryona Sill on 02/13/2014  at22:02.   Electronically Signed   By: Suzy Bouchard M.D.   On: 02/13/2014 22:02     EKG Interpretation   Date/Time:  Friday February 13 2014 19:53:28 EDT Ventricular Rate:  100 PR Interval:    QRS Duration: 121 QT Interval:  362 QTC Calculation: 467 R Axis:   100 Text Interpretation:  Atrial fibrillation Nonspecific intraventricular  conduction delay Probable lateral infarct, age indeterminate Anteroseptal  infarct, old Baseline wander in lead(s) V3 V4 V5 No significant change  since last tracing Confirmed by Amrita Radu  MD, Avett Reineck [phone]) on  02/13/2014 8:33:19 PM      MDM   Final diagnoses:  Aspiration pneumonitis  SBO (small bowel obstruction)  Pneumothorax  Pneumatosis of intestines  Hyperkalemia    Patient presents to the ER for evaluation of 3 days of worsening abdominal pain with nausea and vomiting. Patient had a distended, generally tender abdomen without obvious peritonitis. He was normotensive at arrival, but has gradually become hypotensive. Initially, I was hesitant to give him fluids because of his renal status, but he has required fluids 2 elevated  blood pressure.  Workup included blood work in CAT scan. Patient has multiple problems identified. In addition to his chronic renal failure, he appears to have mild hyperkalemia. He is in atrial fibrillation and takes Coumadin. He is supratherapeutic with an INR above 6. He is not actively bleeding, however.  CT scan of abdomen and pelvis reveals evidence of small bowel obstruction with cecal pneumatosis. This could be secondary to ischemia or inflammatory process of the small bowel obstruction. He does have atrial fibrillation, is at risk for emboli, but as already mentioned is supratherapeutic on his Coumadin.  CAT scan also shows evidence of chronic right lower lobe atelectasis and effusion, but there is active pneumonitis in the left lower lobe concerning for aspiration. He has had multiple episodes of emesis today. There is in addition to this, evidence of pneumothorax which is of unclear etiology. Chest x-ray obtained does not show any significant pneumothorax above the visualized fields on the CAT scan, however.  Patient will require a higher level of care than can be provided at this hospital. I discussed the case with Doctor Tamala Julian, on-call for critical care at [facility]. It was agreed that the patient will be given dextrose and insulin for his hyperkalemia. He'll be started on Zosyn, Levaquin and vancomycin for aspiration pneumonia/H. As well as intra-abdominal pathology. Hypertension will be treated with fluid up to 1 L, then add Levophed if still hypotensive. Dr. Grandville Silos, general surgery at [facility], was contacted and informed of the patient's problem list and condition. He will consult on the patient when he arrives. Transfer to [facility] for further management.  CRITICAL CARE Performed by: Orpah Greek   Total critical care time: 57min  Critical care time was exclusive of separately billable procedures and treating other patients.  Critical care was necessary to treat  or prevent imminent or life-threatening deterioration.  Critical care was time spent personally by me on the following activities: development of treatment plan with patient and/or surrogate as well as nursing, discussions with consultants, evaluation of patient's response to treatment, examination of patient, obtaining  history from patient or surrogate, ordering and performing treatments and interventions, ordering and review of laboratory studies, ordering and review of radiographic studies, pulse oximetry and re-evaluation of patient's condition.    Orpah Greek, MD 02/13/14 2003  Orpah Greek, MD 02/13/14 [phone]

## 2014-02-13 NOTE — ED Provider Notes (Signed)
Recheck SBP at 89 Pt awake/alert [facility] here for transport  Sharyon Cable, MD 02/13/14 2333

## 2014-02-13 NOTE — ED Notes (Signed)
Pt c/o abdominal pain and n/v since Tuesday. Went to pcp today and was told he may have diverticulitis and has a ct set up for Monday.

## 2014-02-13 NOTE — ED Notes (Signed)
CRITICAL VALUE ALERT  Critical value received:  INR 6.15  Date of notification:  02/13/2014  Time of notification:  2120  Critical value read back:yes  Nurse who received alert:  LRT  MD notified (1st page): Pollina  Time of first page:  2121  MD notified (2nd page):  Time of second page:  Responding MD:  Betsey Holiday  Time MD responded:  2121

## 2014-02-13 NOTE — ED Notes (Addendum)
Monitor readings for BP very low.  Manual pressure 82/60 and very faint to auscultation and palpation.  Patient states his BP runs low during dialysis.  He is alert and oriented x 4 at present.  Dr. Betsey Holiday made aware

## 2014-02-13 NOTE — ED Notes (Signed)
Vomited x 1 while in waiting room.  C/O intermittent lower abdominal pain since Tuesday this week.

## 2014-02-14 ENCOUNTER — Inpatient Hospital Stay (HOSPITAL_COMMUNITY): Payer: Medicare Other

## 2014-02-14 ENCOUNTER — Encounter (HOSPITAL_COMMUNITY): Payer: Self-pay | Admitting: *Deleted

## 2014-02-14 DIAGNOSIS — I4891 Unspecified atrial fibrillation: Secondary | ICD-10-CM

## 2014-02-14 DIAGNOSIS — R6521 Severe sepsis with septic shock: Secondary | ICD-10-CM

## 2014-02-14 DIAGNOSIS — E872 Acidosis, unspecified: Secondary | ICD-10-CM

## 2014-02-14 DIAGNOSIS — J9383 Other pneumothorax: Secondary | ICD-10-CM

## 2014-02-14 DIAGNOSIS — K56609 Unspecified intestinal obstruction, unspecified as to partial versus complete obstruction: Secondary | ICD-10-CM

## 2014-02-14 DIAGNOSIS — R6520 Severe sepsis without septic shock: Secondary | ICD-10-CM

## 2014-02-14 DIAGNOSIS — K6389 Other specified diseases of intestine: Secondary | ICD-10-CM

## 2014-02-14 DIAGNOSIS — J90 Pleural effusion, not elsewhere classified: Secondary | ICD-10-CM

## 2014-02-14 DIAGNOSIS — A419 Sepsis, unspecified organism: Secondary | ICD-10-CM

## 2014-02-14 DIAGNOSIS — J690 Pneumonitis due to inhalation of food and vomit: Secondary | ICD-10-CM

## 2014-02-14 DIAGNOSIS — N186 End stage renal disease: Secondary | ICD-10-CM

## 2014-02-14 LAB — GLUCOSE, CAPILLARY
Glucose-Capillary: 112 mg/dL — ABNORMAL HIGH (ref 70–99)
Glucose-Capillary: 125 mg/dL — ABNORMAL HIGH (ref 70–99)
Glucose-Capillary: 149 mg/dL — ABNORMAL HIGH (ref 70–99)
Glucose-Capillary: 190 mg/dL — ABNORMAL HIGH (ref 70–99)
Glucose-Capillary: 197 mg/dL — ABNORMAL HIGH (ref 70–99)
Glucose-Capillary: 237 mg/dL — ABNORMAL HIGH (ref 70–99)

## 2014-02-14 LAB — BASIC METABOLIC PANEL
BUN: 58 mg/dL — AB (ref 6–23)
CALCIUM: 10 mg/dL (ref 8.4–10.5)
CO2: 23 mEq/L (ref 19–32)
Chloride: 89 mEq/L — ABNORMAL LOW (ref 96–112)
Creatinine, Ser: 9.63 mg/dL — ABNORMAL HIGH (ref 0.50–1.35)
GFR calc Af Amer: 6 mL/min — ABNORMAL LOW (ref >90)
GFR, EST NON AFRICAN AMERICAN: 5 mL/min — AB (ref >90)
GLUCOSE: 214 mg/dL — AB (ref 70–99)
Potassium: 5.8 mEq/L — ABNORMAL HIGH (ref 3.7–5.3)
SODIUM: 135 meq/L — AB (ref 137–147)

## 2014-02-14 LAB — MAGNESIUM: Magnesium: 1.8 mg/dL (ref 1.5–2.5)

## 2014-02-14 LAB — CBC
HCT: 32.3 % — ABNORMAL LOW (ref 39.0–52.0)
Hemoglobin: 10.6 g/dL — ABNORMAL LOW (ref 13.0–17.0)
MCH: 29.6 pg (ref 26.0–34.0)
MCHC: 32.8 g/dL (ref 30.0–36.0)
MCV: 90.2 fL (ref 78.0–100.0)
Platelets: 224 10*3/uL (ref 150–400)
RBC: 3.58 MIL/uL — ABNORMAL LOW (ref 4.22–5.81)
RDW: 15.8 % — ABNORMAL HIGH (ref 11.5–15.5)
WBC: 10.8 10*3/uL — ABNORMAL HIGH (ref 4.0–10.5)

## 2014-02-14 LAB — PROTIME-INR
INR: 8.82 — AB (ref 0.00–1.49)
INR: 1.9 — ABNORMAL HIGH (ref 0.00–1.49)
PROTHROMBIN TIME: 21.2 s — AB (ref 11.6–15.2)
Prothrombin Time: 68.5 seconds — ABNORMAL HIGH (ref 11.6–15.2)

## 2014-02-14 LAB — PHOSPHORUS: Phosphorus: 4.9 mg/dL — ABNORMAL HIGH (ref 2.3–4.6)

## 2014-02-14 LAB — LACTIC ACID, PLASMA: Lactic Acid, Venous: 2.8 mmol/L — ABNORMAL HIGH (ref 0.5–2.2)

## 2014-02-14 LAB — EXPECTORATED SPUTUM ASSESSMENT W GRAM STAIN, RFLX TO RESP C

## 2014-02-14 LAB — MRSA PCR SCREENING: MRSA BY PCR: POSITIVE — AB

## 2014-02-14 LAB — HEPATITIS B SURFACE ANTIGEN: Hepatitis B Surface Ag: NEGATIVE

## 2014-02-14 LAB — EXPECTORATED SPUTUM ASSESSMENT W REFEX TO RESP CULTURE

## 2014-02-14 MED ORDER — ALTEPLASE 2 MG IJ SOLR: 2.0000 mg | Freq: Once | INTRAMUSCULAR | Status: AC | PRN

## 2014-02-14 MED ORDER — SODIUM CHLORIDE 0.9 % IJ SOLN
10.0000 mL | Freq: Two times a day (BID) | INTRAMUSCULAR | Status: DC
  Administered 2014-02-14: 30 mL
  Administered 2014-02-14: 20 mL
  Administered 2014-02-15: 10 mL
  Administered 2014-02-15 – 2014-02-16 (×2): 30 mL
  Administered 2014-02-16 – 2014-02-17 (×2): 20 mL
  Administered 2014-02-17 – 2014-02-18 (×2): 10 mL
  Administered 2014-02-18 – 2014-02-19 (×2): 30 mL

## 2014-02-14 MED ORDER — ASPIRIN 300 MG RE SUPP
300.0000 mg | RECTAL | Status: AC
  Administered 2014-02-14: 300 mg via RECTAL
  Filled 2014-02-14: qty 1

## 2014-02-14 MED ORDER — SODIUM CHLORIDE 0.9 % IV SOLN
250.0000 mL | INTRAVENOUS | Status: DC | PRN
  Administered 2014-02-14: 10 mL via INTRAVENOUS
  Administered 2014-02-17: 250 mL via INTRAVENOUS

## 2014-02-14 MED ORDER — NEPRO/CARBSTEADY PO LIQD: 237.0000 mL | ORAL | Status: DC | PRN

## 2014-02-14 MED ORDER — MUPIROCIN 2 % EX OINT
1.0000 "application " | TOPICAL_OINTMENT | Freq: Two times a day (BID) | CUTANEOUS | Status: AC
  Administered 2014-02-14 – 2014-02-18 (×10): 1 via NASAL
  Filled 2014-02-14 (×2): qty 22

## 2014-02-14 MED ORDER — INSULIN ASPART 100 UNIT/ML ~~LOC~~ SOLN
2.0000 [IU] | SUBCUTANEOUS | Status: DC
  Administered 2014-02-14 (×2): 2 [IU] via SUBCUTANEOUS
  Administered 2014-02-14 (×2): 4 [IU] via SUBCUTANEOUS
  Administered 2014-02-15 – 2014-02-16 (×4): 2 [IU] via SUBCUTANEOUS

## 2014-02-14 MED ORDER — SODIUM CHLORIDE 0.9 % IJ SOLN
3.0000 mL | Freq: Two times a day (BID) | INTRAMUSCULAR | Status: DC
  Administered 2014-02-14: 10 mL via INTRAVENOUS
  Administered 2014-02-14: 3 mL via INTRAVENOUS
  Administered 2014-02-15: 10 mL via INTRAVENOUS
  Administered 2014-02-15 – 2014-02-18 (×5): 3 mL via INTRAVENOUS

## 2014-02-14 MED ORDER — ASPIRIN 81 MG PO CHEW: 324.0000 mg | CHEWABLE_TABLET | ORAL | Status: AC

## 2014-02-14 MED ORDER — VITAMIN K1 10 MG/ML IJ SOLN
10.0000 mg | Freq: Once | INTRAVENOUS | Status: AC
  Administered 2014-02-14: 10 mg via INTRAVENOUS
  Filled 2014-02-14: qty 1

## 2014-02-14 MED ORDER — ALBUTEROL SULFATE (2.5 MG/3ML) 0.083% IN NEBU: 2.5000 mg | INHALATION_SOLUTION | RESPIRATORY_TRACT | Status: DC | PRN

## 2014-02-14 MED ORDER — VANCOMYCIN HCL IN DEXTROSE 1-5 GM/200ML-% IV SOLN
1000.0000 mg | Freq: Once | INTRAVENOUS | Status: AC
  Administered 2014-02-14: 1000 mg via INTRAVENOUS
  Filled 2014-02-14: qty 200

## 2014-02-14 MED ORDER — PIPERACILLIN-TAZOBACTAM IN DEX 2-0.25 GM/50ML IV SOLN
2.2500 g | Freq: Three times a day (TID) | INTRAVENOUS | Status: DC
  Administered 2014-02-14 – 2014-02-18 (×12): 2.25 g via INTRAVENOUS
  Filled 2014-02-14 (×15): qty 50

## 2014-02-14 MED ORDER — SODIUM CHLORIDE 0.9 % IV SOLN: 100.0000 mL | INTRAVENOUS | Status: DC | PRN

## 2014-02-14 MED ORDER — PANTOPRAZOLE SODIUM 40 MG IV SOLR
40.0000 mg | Freq: Every day | INTRAVENOUS | Status: DC
  Administered 2014-02-14 – 2014-02-17 (×4): 40 mg via INTRAVENOUS
  Filled 2014-02-14 (×6): qty 40

## 2014-02-14 MED ORDER — LIDOCAINE-PRILOCAINE 2.5-2.5 % EX CREA: 1.0000 "application " | TOPICAL_CREAM | CUTANEOUS | Status: DC | PRN

## 2014-02-14 MED ORDER — PENTAFLUOROPROP-TETRAFLUOROETH EX AERO: 1.0000 "application " | INHALATION_SPRAY | CUTANEOUS | Status: DC | PRN

## 2014-02-14 MED ORDER — SODIUM CHLORIDE 0.9 % IJ SOLN
10.0000 mL | INTRAMUSCULAR | Status: DC | PRN
  Administered 2014-02-14: 10 mL
  Administered 2014-02-16: 20 mL
  Administered 2014-02-20: 30 mL
  Administered 2014-02-20 – 2014-02-21 (×2): 20 mL

## 2014-02-14 MED ORDER — SODIUM CHLORIDE 0.9 % IJ SOLN
3.0000 mL | INTRAMUSCULAR | Status: DC | PRN
  Administered 2014-02-14 – 2014-02-16 (×4): 3 mL via INTRAVENOUS

## 2014-02-14 MED ORDER — CHLORHEXIDINE GLUCONATE CLOTH 2 % EX PADS
6.0000 | MEDICATED_PAD | Freq: Every day | CUTANEOUS | Status: AC
  Administered 2014-02-15 – 2014-02-19 (×5): 6 via TOPICAL

## 2014-02-14 MED ORDER — LIDOCAINE HCL (PF) 1 % IJ SOLN: 5.0000 mL | INTRAMUSCULAR | Status: DC | PRN

## 2014-02-14 MED ORDER — HEPARIN SODIUM (PORCINE) 1000 UNIT/ML DIALYSIS: 1000.0000 [IU] | INTRAMUSCULAR | Status: DC | PRN

## 2014-02-14 NOTE — Consult Note (Signed)
Reason for Consult:SBO Referring Physician: [name] is an 63 y.o. male.  HPI: Patient complains of a two-week history of nausea. It worsened leading to vomiting for the past 3 days. He has mild associated abdominal pain, at times. He was evaluated in the [facility] emergency department. He was accepted in transfer by the critical care medicine service at [facility]. He was also found to be over anticoagulated. He takes Coumadin for atrial fibrillation. He has end-stage renal disease currently on dialysis. He complains of some crampy abdominal pain at this time. Last bowel movement was today and it was soft/liquid.  Past Medical History  Diagnosis Date  . UGI bleed 02/12/11    On anticoagulation; EGD w/[name] polypectomy-multiple polypoid lesions antrum(benign), Chronic active gastritis (NEGATIVE H [name])  . Anemia, chronic disease   . Sepsis due to enterococcus 02/11/11  . Atrial fibrillation     On coumadin  . Coronary atherosclerosis of native coronary artery     Multivessel status post CABG  . Cardiomyopathy     LVEF 40-45% 01/2011  . Type 2 diabetes mellitus   . Essential hypertension, benign   . Hyperlipemia   . Gout   . S/P patent foramen ovale closure   . ESRD on hemodialysis     T/TH/SAT dialyis - Dr [name] [name]  . History of nuclear stress test 07/04/2010    dipyridamole; mod fixed inferolateral defect; non-diagnostic for ischemia; low risk scan   . Hyperbilirubinemia 08/10/2013    Past Surgical History  Procedure Laterality Date  . Mitral valve replacement  01/05/2007    Bioprosthetic 70m Edwards precordial tissue (Dr. GServando [name]  . Coronary artery bypass graft  01/05/2007    LIMA-LAD, SVG-OM, seq. SVG-PDA, PLA-RCA  . Colonoscopy  06/23/2011    [name] [name] multiple sessile and pedunculated polyps, moderate diverticulosis, internal hemorrhoids, +ADENOMATOUS POLYPS, consider genetic testing, surveillance in October 2015   . Esophagogastroduodenoscopy   02/12/2011    CHRONIC ACTIVE GASTRITIS, NO [name] [name]  . Transthoracic echocardiogram  11/2011    EF 25% w/ mod dilatation of LV & mod LVH; LA severely dilated; mod-severe dilation of RV with mod-severe decrease in RV function; mild MR & TR  . Cardioversion  04/19/2007    Dr. RMarella [name] . Transthoracic echocardiogram  02/14/2011    EF 40-45%, mod conc LVH; ventricular septum with motion showing abnormal function, dyssynergy, paradox; mild AS; mild-mod MR stenosis; LA severely dilated; aptrial septum bowed from L to [name] with increased LA pressure   . Amputation Right 07/14/2013    Procedure: AMPUTATION DIGIT;  Surgeon: [name] MD;  Location: [facility];  Service: General;  Laterality: Right;  . Amputation Right 08/08/2013    Procedure: AMPUTATION BELOW KNEE;  Surgeon: [name] MD;  Location: [facility];  Service: General;  Laterality: Right;  . Stump revision Right 09/19/2013    Procedure: STUMP REVISION;  Surgeon: WScherry Ran MD;  Location: [facility];  Service: General;  Laterality: Right;  . Amputation Right 10/17/2013    Procedure: AMPUTATION ABOVE KNEE;  Surgeon: [name] MD;  Location: [facility];  Service: General;  Laterality: Right;    Family History  Problem Relation Age of Onset  . Diabetes Mother   . Diabetes Father   . Diabetes Sister   . Colon cancer Neg Hx     Social History:  reports that he quit smoking about 7 years ago. His smoking use included Cigarettes. He has a  8 pack-year smoking history. He has never used smokeless tobacco. He reports that he does not drink alcohol or use illicit drugs.  Allergies:  Allergies  Allergen Reactions  . Bacitracin Other (See Comments)    unknown  . Norvasc [Amlodipine Besylate] Other (See Comments)    unknown    Medications:  Prior to Admission:  Prescriptions prior to admission  Medication Sig Dispense Refill  . allopurinol (ZYLOPRIM) 100 MG tablet Take 100 mg by mouth daily.       Marland Kitchen amiodarone (PACERONE) 100 MG  tablet Take 100 mg by mouth daily.      Marland Kitchen aspirin EC 81 MG tablet Take 81 mg by mouth every evening.      Marland Kitchen atorvastatin (LIPITOR) 20 MG tablet Take 20 mg by mouth every evening.       . B Complex-C-Zn-Folic Acid (DIALYVITE/ZINC PO) Take by mouth 3 (three) times a week. Patient has dialysis on Tuesdays, Thursdays, and Saturdays      . diltiazem (CARDIZEM CD) 180 MG 24 hr capsule Take 1 capsule (180 mg total) by mouth daily.  30 capsule  1  . folic acid (FOLVITE) 1 MG tablet Take 1 mg by mouth daily.        . insulin glargine (LANTUS) 100 UNIT/ML injection Inject 10 Units into the skin at bedtime.      . metoprolol tartrate (LOPRESSOR) 25 MG tablet Take 1 tablet (25 mg total) by mouth 3 (three) times daily.      . pantoprazole (PROTONIX) 40 MG tablet Take 40 mg by mouth 2 (two) times daily.       Marland Kitchen RENVELA 800 MG tablet Take 800 mg by mouth 3 (three) times daily with meals.       . SENSIPAR 30 MG tablet Take 30 mg by mouth daily.      . traMADol (ULTRAM) 50 MG tablet Take by mouth every 8 (eight) hours as needed for moderate pain.      . traZODone (DESYREL) 50 MG tablet Take 1 tablet (50 mg total) by mouth at bedtime.  30 tablet  0  . warfarin (COUMADIN) 5 MG tablet Take 2.5-5 mg by mouth See admin instructions. TAKES ONE HALF TABLET EVERY DAY EXCEPT TAKE ONE-HALF TABLET ON WEDNESDAYS      . ciprofloxacin (CIPRO) 500 MG tablet Take 500 mg by mouth daily.        Results for orders placed during the hospital encounter of 02/13/14 (from the past 48 hour(s))  CBC WITH DIFFERENTIAL     Status: Abnormal   Collection Time    02/13/14  8:01 PM      Result Value Ref Range   WBC 9.7  4.0 - 10.5 K/uL   RBC 4.35  4.22 - 5.81 MIL/uL   Hemoglobin 13.1  13.0 - 17.0 g/dL   HCT 40.3  39.0 - 52.0 %   MCV 92.6  78.0 - 100.0 fL   MCH 30.1  26.0 - 34.0 pg   MCHC 32.5  30.0 - 36.0 g/dL   RDW 16.2 (*) 11.5 - 15.5 %   Platelets 239  150 - 400 K/uL   Neutrophils Relative % 74  43 - 77 %   Neutro Abs 7.3  1.7 -  7.7 K/uL   Lymphocytes Relative 11 (*) 12 - 46 %   Lymphs Abs 1.1  0.7 - 4.0 K/uL   Monocytes Relative 9  3 - 12 %   Monocytes Absolute 0.9  0.1 - 1.0 K/uL  Eosinophils Relative 6 (*) 0 - 5 %   Eosinophils Absolute 0.5  0.0 - 0.7 K/uL   Basophils Relative 0  0 - 1 %   Basophils Absolute 0.0  0.0 - 0.1 K/uL  COMPREHENSIVE METABOLIC PANEL     Status: Abnormal   Collection Time    02/13/14  8:01 PM      Result Value Ref Range   Sodium 140  137 - 147 mEq/L   Potassium 5.7 (*) 3.7 - 5.3 mEq/L   Chloride 91 (*) 96 - 112 mEq/L   CO2 24  19 - 32 mEq/L   Glucose, Bld 199 (*) 70 - 99 mg/dL   BUN 54 (*) 6 - 23 mg/dL   Creatinine, Ser 9.42 (*) 0.50 - 1.35 mg/dL   Calcium 10.8 (*) 8.4 - 10.5 mg/dL   Total Protein 8.2  6.0 - 8.3 g/dL   Albumin 2.7 (*) 3.5 - 5.2 g/dL   AST 28  0 - 37 U/L   ALT 19  0 - 53 U/L   Alkaline Phosphatase 95  39 - 117 U/L   Total Bilirubin 2.5 (*) 0.3 - 1.2 mg/dL   GFR calc non Af Amer 5 (*) >90 mL/min   GFR calc Af Amer 6 (*) >90 mL/min   Comment: (NOTE)     The eGFR has been calculated using the CKD EPI equation.     This calculation has not been validated in all clinical situations.     eGFR's persistently <90 mL/min signify possible Chronic Kidney     Disease.  TROPONIN I     Status: None   Collection Time    02/13/14  8:22 PM      Result Value Ref Range   Troponin I <0.30  <0.30 ng/mL   Comment:            Due to the release kinetics of cTnI,     a negative result within the first hours     of the onset of symptoms does not rule out     myocardial infarction with certainty.     If myocardial infarction is still suspected,     repeat the test at appropriate intervals.  LIPASE, BLOOD     Status: None   Collection Time    02/13/14  8:22 PM      Result Value Ref Range   Lipase 39  11 - 59 U/L  PROTIME-INR     Status: Abnormal   Collection Time    02/13/14  8:22 PM      Result Value Ref Range   Prothrombin Time 52.0 (*) 11.6 - 15.2 seconds   INR  6.15 (*) 0.00 - 1.49   Comment: RESULT REPEATED AND VERIFIED     CRITICAL RESULT CALLED TO, READ BACK BY AND VERIFIED WITH:     TAYLOR,L @ 2120 ON 02/13/14 BY WOODIE,J  BLOOD GAS, ARTERIAL     Status: Abnormal   Collection Time    02/13/14 10:35 PM      Result Value Ref Range   O2 Content 2.0     Delivery systems NASAL CANNULA     pH, Arterial 7.406  7.350 - 7.450   pCO2 arterial 36.5  35.0 - 45.0 mmHg   pO2, Arterial 65.4 (*) 80.0 - 100.0 mmHg   Bicarbonate 22.4  20.0 - 24.0 mEq/L   TCO2 20.4  0 - 100 mmol/L   Acid-base deficit 1.6  0.0 - 2.0 mmol/L   O2  Saturation 89.9     Patient temperature 37.0     Collection site LEFT RADIAL     Drawn by [phone]     Sample type ARTERIAL DRAW     Allens test (pass/fail) PASS  PASS  LACTIC ACID, PLASMA     Status: Abnormal   Collection Time    02/13/14 11:06 PM      Result Value Ref Range   Lactic Acid, Venous 4.6 (*) 0.5 - 2.2 mmol/L  CBG MONITORING, ED     Status: Abnormal   Collection Time    02/13/14 11:30 PM      Result Value Ref Range   Glucose-Capillary 238 (*) 70 - 99 mg/dL  GLUCOSE, CAPILLARY     Status: Abnormal   Collection Time    02/14/14 12:51 AM      Result Value Ref Range   Glucose-Capillary 237 (*) 70 - 99 mg/dL   Comment 1 Notify RN      Dg Chest 2 View  02/13/2014   CLINICAL DATA:  Shortness of breath and weakness.  EXAM: CHEST  2 VIEW  COMPARISON:  CT of the abdomen and pelvis performed earlier today at 9:41 p.m., and chest radiograph from 09/17/2013  FINDINGS: Trace right-sided pneumothorax is seen. A small to moderate right-sided pleural effusion is noted, with associated airspace opacification. This may reflect atelectasis or pneumonia. Mild left basilar opacity may also reflect pneumonia, given the appearance on CT.  The heart is borderline enlarged. The patient is status post median sternotomy, with evidence of prior CABG. No acute osseous abnormalities are seen.  IMPRESSION: 1. Trace right-sided pneumothorax seen,  as noted on CT. 2. Small-to-moderate right-sided pleural effusion, with associated airspace opacification; this may reflect atelectasis or pneumonia. Mild left basilar pneumonia seen. 3. Borderline cardiomegaly.   Electronically Signed   By: Garald Balding M.D.   On: 02/13/2014 23:22   Ct Abdomen Pelvis W Contrast  02/13/2014   CLINICAL DATA:  Abdominal pain, end-stage renal disease  EXAM: CT ABDOMEN AND PELVIS WITH CONTRAST  TECHNIQUE: Multidetector CT imaging of the abdomen and pelvis was performed using the standard protocol following bolus administration of intravenous contrast.  CONTRAST:  100 mL Isovue  COMPARISON:  CT chest 12/06/2011  FINDINGS: There is a chronic right pleural effusion with associated dense right basilar atelectasis within air bronchograms. Cannot exclude right lower lobe pneumonia. There is a small pneumothorax anterior to the right middle lobe. No pericardial fluid. Mild airspace disease in the lower left lower lobe could represent pneumonia or aspiration pneumonitis.  No focal hepatic lesion. The gallbladder, pancreas, spleen, adrenal glands, are normal. The kidneys show minimal enhancement. Cyst of the left kidney.  The stomach contains a moderate amount of oral contrast. The duodenum and jejunum are dilated with poor progression of the oral contrast. Proximal small bowel loops measure 4 cm. The most distal small bowel is near completely collapsed in the pelvis (images 78, series 2). At discrete transition point is not identified. There is gas within the wall of the cecum tip as seen on coronal image 51, series 4. The appendix appears normal. The ascending colon is collapsed. The transverse and descending colon are collapsed.  Abdominal aorta is heavily calcified. No retroperitoneal periportal lymphadenopathy.  No free fluid the pelvis. The prostate gland and bladder normal. No pelvic lymphadenopathy. There is bilateral inguinal hernias. No aggressive osseous lesion. There is chronic  appearing compression fracture of the L1 vertebral body.  IMPRESSION: 1. Chronic right pleural  effusion with associated right lower lobe atelectasis. Cannot exclude right lower lobe pneumonia. 2. Small pneumothorax is noted anterior to the right middle lobe. Recommend chest radiograph to exclude a larger pneumothorax. 3. Pneumonitis versus pneumonia at the left lung base. Consider aspiration. 4. Small bowel obstruction pattern with change in caliber from the proximal small bowel to the distal small bowel with a poor progression oral contrast. No transition point identified. 5. Pneumatosis of the tip of the cecum. This could be a benign pneumatosis but cannot exclude ischemia. 6. Appendix is normal. Findings conveyed toCHRISTOPHER POLLINA on 02/13/2014  at22:02.   Electronically Signed   By: Suzy Bouchard M.D.   On: 02/13/2014 22:02    Review of Systems  Constitutional: Positive for weight loss.  HENT: Negative.   Eyes: Negative.   Respiratory: Negative.   Cardiovascular: Negative.   Gastrointestinal: Positive for nausea, vomiting and abdominal pain.  Genitourinary:       End-stage renal disease, does make some urine  Musculoskeletal:       Right lower extremity amputation  Skin: Negative.   Neurological: Negative.   Endo/Heme/Allergies: Bruises/bleeds easily.  Psychiatric/Behavioral: Negative.    Blood pressure 120/59, pulse 105, temperature 97.9 F (36.6 C), temperature source Oral, resp. rate 34, height 6' 4"  (1.93 m), weight 193 lb 9 oz (87.8 kg), SpO2 6.00%. Physical Exam  Constitutional: He is oriented to person, place, and time. He appears well-developed. No distress.  HENT:  Head: Normocephalic.  Nose: Nose normal.  Mouth/Throat: Oropharynx is clear and moist.  Eyes: Pupils are equal, round, and reactive to light. Right eye exhibits no discharge. Left eye exhibits no discharge.  Neck: Normal range of motion. Neck supple. No tracheal deviation present.  Cardiovascular:   Irregularly irregular, rate 110  Respiratory: Effort normal. No stridor. He has no wheezes. He exhibits no tenderness.  Decreased breath sounds right base  GI: Soft. He exhibits distension. There is tenderness. There is no rebound and no guarding.  Distended and hypoactive bowel sounds, very minimal tenderness, tiny umbilical hernia reduces, no other hernias or discrete mass  Musculoskeletal:   AV fistula right forearm, right lower extremity amputation  Neurological: He is alert and oriented to person, place, and time.  Skin: Skin is warm.  Psychiatric: He has a normal mood and affect.    Assessment/Plan: Small bowel obstruction without obvious cause. Caliber of small bowel gradually tapers without clear transition point.Small area of cecal pneumatosis may be incidental.Abdomen is not significantly tender and white blood cell count is normal. Lactate is elevated. INR is elevated. Agree with admission by critical care. Place NG tube to low intermittent suction. We will follow his exam later this morning. Gentle correction of INR.  THOMPSON,BURKE E 02/14/2014, 1:12 AM

## 2014-02-14 NOTE — Progress Notes (Signed)
PULMONARY / CRITICAL CARE MEDICINE   Name: [name] [name] MRN: [id number] DOB: [birth year]    ADMISSION DATE:  02/13/2014  PRIMARY SERVICE: PCCM  CHIEF COMPLAINT:  SBO  BRIEF PATIENT DESCRIPTION:  63 years old with complex PMH including ESRD on HD T/Th/Sat, A. Fib on coumadin, HTN, cardiomyopathy with most recent LVEF of 15 to 20%, DM2, UGI bleed. Present with nausea, vomiting and abdominal dystention. CT of the abdomen shows SBO and small area of cecal pneumatosis. Transferred from [facility].   SIGNIFICANT EVENTS / STUDIES:  - CT abdomen: SBO with small area of pneumatosis at the tip of the cecum. Chronic right pleural effusion. Small pneumothorax.   LINES / TUBES: - Peripheral IV's - Left IJ CVC 6/20>>>  CULTURES: - Blood cultures ordered 6/20>>> - Sputum culture ordered 6/20>>>  ANTIBIOTICS: - Zosyn 6/20>>> - Vancomycin 6/20>>>  SUBJECTIVE: continues to complain of abdominal pain.  VITAL SIGNS: Temp:  [97.9 F (36.6 C)-98.5 F (36.9 C)] 98.3 F (36.8 C) (06/20 0736) Pulse Rate:  [26-110] 26 (06/20 0600) Resp:  [19-34] 31 (06/20 0600) BP: (82-138)/(36-93) 100/51 mmHg (06/20 0600) SpO2:  [6 %-99 %] 90 % (06/20 0600) Weight:  [193 lb 9 oz (87.8 kg)] 193 lb 9 oz (87.8 kg) (06/20 0100) HEMODYNAMICS:   VENTILATOR SETTINGS:   INTAKE / OUTPUT: Intake/Output     06/19 0701 - 06/20 0700 06/20 0701 - 06/21 0700   I.V. (mL/kg) 74.7 (0.9)    IV Piggyback 750    Total Intake(mL/kg) 824.7 (9.4)    Emesis/NG output 1000    Total Output 1000     Net -175.3            PHYSICAL EXAMINATION: General: Pleasant male patient in no acute distress.  Chronically ill appearing. Eyes: Anicteric sclerae. ENT: Oropharynx clear. Dry mucous membranes. No thrush Heart: Irregular rhythm. No murmurs, rubs, or gallops appreciated. No bruits, equal pulses in upper extremities. Lungs: Diminished BS diffusely L>R. Abdomen: Abdomen soft, mildly tender, moderately distended and tympanic on  percussion. Normoactive bowel sounds. No hepatosplenomegaly or masses. Musculoskeletal: No clubbing or synovitis. Right AKA, no edema on left leg.  Skin: No rashes or lesions Neuro: No focal neurologic deficits.  LABS:  CBC  Recent Labs Lab 02/13/14 2001 02/14/14 0324  WBC 9.7 10.8*  HGB 13.1 10.6*  HCT 40.3 32.3*  PLT 239 224   Coag's  Recent Labs Lab 02/13/14 2022 02/14/14 0431  INR 6.15* 8.82*   BMET  Recent Labs Lab 02/13/14 2001 02/14/14 0324  NA 140 135*  K 5.7* 5.8*  CL 91* 89*  CO2 24 23  BUN 54* 58*  CREATININE 9.42* 9.63*  GLUCOSE 199* 214*   Electrolytes  Recent Labs Lab 02/13/14 2001 02/14/14 0324  CALCIUM 10.8* 10.0  MG  --  1.8  PHOS  --  4.9*   Sepsis Markers  Recent Labs Lab 02/13/14 2306 02/14/14 0324  LATICACIDVEN 4.6* 2.8*   ABG  Recent Labs Lab 02/13/14 2235  PHART 7.406  PCO2ART 36.5  PO2ART 65.4*   Liver Enzymes  Recent Labs Lab 02/13/14 2001  AST 28  ALT 19  ALKPHOS 95  BILITOT 2.5*  ALBUMIN 2.7*   Cardiac Enzymes  Recent Labs Lab 02/13/14 2022  TROPONINI <0.30   Glucose  Recent Labs Lab 02/13/14 2330 02/14/14 0051 02/14/14 0329 02/14/14 0730  GLUCAP 238* 237* 197* 190*    Imaging Dg Chest 2 View  02/13/2014   CLINICAL DATA:  Shortness of breath and  weakness.  EXAM: CHEST  2 VIEW  COMPARISON:  CT of the abdomen and pelvis performed earlier today at 9:41 p.m., and chest radiograph from 09/17/2013  FINDINGS: Trace right-sided pneumothorax is seen. A small to moderate right-sided pleural effusion is noted, with associated airspace opacification. This may reflect atelectasis or pneumonia. Mild left basilar opacity may also reflect pneumonia, given the appearance on CT.  The heart is borderline enlarged. The patient is status post median sternotomy, with evidence of prior CABG. No acute osseous abnormalities are seen.  IMPRESSION: 1. Trace right-sided pneumothorax seen, as noted on CT. 2.  Small-to-moderate right-sided pleural effusion, with associated airspace opacification; this may reflect atelectasis or pneumonia. Mild left basilar pneumonia seen. 3. Borderline cardiomegaly.   Electronically Signed   By: Garald Balding M.D.   On: 02/13/2014 23:22   Ct Abdomen Pelvis W Contrast  02/13/2014   CLINICAL DATA:  Abdominal pain, end-stage renal disease  EXAM: CT ABDOMEN AND PELVIS WITH CONTRAST  TECHNIQUE: Multidetector CT imaging of the abdomen and pelvis was performed using the standard protocol following bolus administration of intravenous contrast.  CONTRAST:  100 mL Isovue  COMPARISON:  CT chest 12/06/2011  FINDINGS: There is a chronic right pleural effusion with associated dense right basilar atelectasis within air bronchograms. Cannot exclude right lower lobe pneumonia. There is a small pneumothorax anterior to the right middle lobe. No pericardial fluid. Mild airspace disease in the lower left lower lobe could represent pneumonia or aspiration pneumonitis.  No focal hepatic lesion. The gallbladder, pancreas, spleen, adrenal glands, are normal. The kidneys show minimal enhancement. Cyst of the left kidney.  The stomach contains a moderate amount of oral contrast. The duodenum and jejunum are dilated with poor progression of the oral contrast. Proximal small bowel loops measure 4 cm. The most distal small bowel is near completely collapsed in the pelvis (images 78, series 2). At discrete transition point is not identified. There is gas within the wall of the cecum tip as seen on coronal image 51, series 4. The appendix appears normal. The ascending colon is collapsed. The transverse and descending colon are collapsed.  Abdominal aorta is heavily calcified. No retroperitoneal periportal lymphadenopathy.  No free fluid the pelvis. The prostate gland and bladder normal. No pelvic lymphadenopathy. There is bilateral inguinal hernias. No aggressive osseous lesion. There is chronic appearing  compression fracture of the L1 vertebral body.  IMPRESSION: 1. Chronic right pleural effusion with associated right lower lobe atelectasis. Cannot exclude right lower lobe pneumonia. 2. Small pneumothorax is noted anterior to the right middle lobe. Recommend chest radiograph to exclude a larger pneumothorax. 3. Pneumonitis versus pneumonia at the left lung base. Consider aspiration. 4. Small bowel obstruction pattern with change in caliber from the proximal small bowel to the distal small bowel with a poor progression oral contrast. No transition point identified. 5. Pneumatosis of the tip of the cecum. This could be a benign pneumatosis but cannot exclude ischemia. 6. Appendix is normal. Findings conveyed toCHRISTOPHER POLLINA on 02/13/2014  at22:02.   Electronically Signed   By: Suzy Bouchard M.D.   On: 02/13/2014 22:02   Dg Chest Port 1 View  02/14/2014   CLINICAL DATA:  Central line and nasogastric tube placement.  EXAM: PORTABLE CHEST - 1 VIEW  COMPARISON:  Chest radiograph from 02/13/2014  FINDINGS: The patient's left IJ line is seen ending about the mid to distal SVC. An enteric tube is noted extending below the diaphragm.  There is a mildly worsened  small to moderate right-sided pleural effusion. The previously noted trace right pneumothorax is suggested but not well seen. Worsening bibasilar airspace opacification may reflect worsening pneumonia. Asymmetric interstitial edema might have a similar appearance. Underlying vascular congestion is seen.  The cardiomediastinal silhouette is enlarged. The patient status post median sternotomy, with evidence of prior CABG. No acute osseous abnormalities are seen.  IMPRESSION: 1. Left IJ line seen ending about the mid to distal SVC. 2. Enteric tube noted extending below the diaphragm. 3. Mildly worsened small to moderate right-sided pleural effusion. Previously noted trace right pneumothorax is suggested but not well seen. 4. Worsening bibasilar airspace  opacification may reflect worsening pneumonia. Asymmetric interstitial edema might have a similar appearance. 5. Underlying vascular congestion and cardiomegaly noted.   Electronically Signed   By: Garald Balding M.D.   On: 02/14/2014 03:04     CXR:  IMPRESSION:  1. Trace right-sided pneumothorax seen, as noted on CT.  2. Small-to-moderate right-sided pleural effusion, with associated  airspace opacification; this may reflect atelectasis or pneumonia.  Mild left basilar pneumonia seen.  3. Borderline cardiomegaly.   ASSESSMENT / PLAN:  PULMONARY A: 1) Right chronic pleural effusion, unclear etiology, may be complex pleural effusion given presence of pneumothorax seen on CT and chest X ray. Will eventually need thoracentesis. Now has supratherapeutic INR. P:   - Continue supplemental O2 to keep O2 sat > 94% - Will eventually need diagnostic thoracentesis once INR is normal.  - Follow up chest X ray in am to assess stability of pneumothorax  CARDIOVASCULAR A:  1) Mild hypotension. Responding to fluids. Got 1 L of NS. Still looks volume depleted on exam. May tolerate extra fluids is hypotensive.  2) Lactic acidosis likely from SBO, volume depletion.  3) Cardiomyopathy with LVEF 15 to 20% with severe diastolic dysfunction. Stable. 4) Atrial fibrillation on coumadin, rate in the low 100's P:  - ICU monitoring - Continue gentle IVF resuscitation - Trend lactate - May need pressors.  - Will continue to hold home oral antihypertensive and antiarrythmic medications including metoprolol, diltiazem, amiodarone  RENAL A:   1) ESRD on HD T / Th / Sat 2)  P:   - Nephrology consult called  - Will follow BMP - Replenish lytes as necessary.   GASTROINTESTINAL A:   1) History of UGI bleed 2) SBO, Surgery at bedside. Abdomen is distended but soft and mildly tender. Surgery recommends conservative management for now. P:   - NPO - NGT placement for decompression - Will follow  lactate - Antibiotics  HEMATOLOGIC A:   1) Supratherapeutic INR. No evidence of bleeding. No need for aggressive reversal.  P:  - Vit K 10 mg IV given - Will follow INR in am - Will repeat INR now to insure that rise from 6 to 8 is accurate.  If accurate then will give another dose of vitamin K IV.  INFECTIOUS A:   1) SBO, cecal pneumatosis 2) Bilateral lower lobes infiltrates, possible pneumonia / Aspiration P:   - Zosyn - Vancomycin  - Will follow blood and sputum cultures  ENDOCRINE A:   1) DM2   P:   - Novolog sliding scale per ICU hyperglycemia protocol  NEUROLOGIC A:   1) No issues P:    TODAY'S SUMMARY: Appreciate input from surgery.  Correct coagulopathy.  Will address PTX (I suspect is a trapped lung/loculated PTX rather than something acute) after vital signs are more stable and coagulopathy has been addressed.  I have  personally obtained a history, examined the patient, evaluated laboratory and imaging results, formulated the assessment and plan and placed orders.  CRITICAL CARE: The patient is critically ill with multiple organ systems failure and requires high complexity decision making for assessment and support, frequent evaluation and titration of therapies, application of advanced monitoring technologies and extensive interpretation of multiple databases. Critical Care Time devoted to patient care services described in this note is 30 minutes.   Rush Farmer, M.D. [facility] Pulmonary/Critical Care Medicine. Pager: [phone]. After hours pager: [phone].

## 2014-02-14 NOTE — Procedures (Signed)
Central Venous Catheter Insertion Procedure Note [name] [name] [id number] 11-Aug-1951  Procedure: Insertion of Central Venous Catheter Indications: Assessment of intravascular volume, Drug and/or fluid administration and Frequent blood sampling  Procedure Details Consent: Risks of procedure as well as the alternatives and risks of each were explained to the (patient/caregiver).  Consent for procedure obtained. Time Out: Verified patient identification, verified procedure, site/side was marked, verified correct patient position, special equipment/implants available, medications/allergies/relevent history reviewed, required imaging and test results available.  Performed  Maximum sterile technique was used including antiseptics, cap, gloves, gown, hand hygiene, mask and sheet. Skin prep: Chlorhexidine; local anesthetic administered A antimicrobial bonded/coated triple lumen catheter was placed in the left internal jugular vein using the Seldinger technique. Procedure done under direct visualization with ultrasound.   Evaluation Blood flow good Complications: No apparent complications Patient did tolerate procedure well. Chest X-ray ordered to verify placement.  CXR: normal.  [name] [name] 02/14/2014, 3:34 AM

## 2014-02-14 NOTE — H&P (Signed)
PULMONARY / CRITICAL CARE MEDICINE   Name: [name] [name] MRN: [id number] DOB: [birth year]    ADMISSION DATE:  02/13/2014  PRIMARY SERVICE: PCCM  CHIEF COMPLAINT:  SBO  BRIEF PATIENT DESCRIPTION:  63 years old with complex PMH including ESRD on HD T/Th/Sat, A. Fib on coumadin, HTN, cardiomyopathy with most recent LVEF of 15 to 20%, DM2, UGI bleed. Present with nausea, vomiting and abdominal dystention. CT of the abdomen shows SBO and small area of cecal pneumatosis. Transferred from [facility].   SIGNIFICANT EVENTS / STUDIES:  - CT abdomen: SBO with small area of pneumatosis at the tip of the cecum. Chronic right pleural effusion. Small pneumothorax.   LINES / TUBES: - Peripheral IV's - Left IJ CVC  CULTURES: - Blood cultures ordered - Sputum culture ordered  ANTIBIOTICS: - Zosyn - Vancomycin  HISTORY OF PRESENT ILLNESS:   63 years old with complex PMH including ESRD on HD T/Th/Sat (last on Thursday), A. Fib on coumadin, HTN, cardiomyopathy with most recent LVEF of 15 to 20%, DM2, UGI bleed. Present with nausea, vomiting and abdominal dystention. CT of the abdomen shows SBO and small area of cecal pneumatosis. Lactic acid of 4.6, INR of 6.1. Chest X ray with chronic right pleural effusion and small right pneumothorax. Transferred from [facility] to assist with management. Got 1 L of NS at [facility].  At the time of my examination the patient is awake, alert, oriented, MAP of 60 to 65. No distress. Unable to get oxygen saturation.   PAST MEDICAL HISTORY :  Past Medical History  Diagnosis Date  . UGI bleed 02/12/11    On anticoagulation; EGD w/snare polypectomy-multiple polypoid lesions antrum(benign), Chronic active gastritis (NEGATIVE H pylori)  . Anemia, chronic disease   . Sepsis due to enterococcus 02/11/11  . Atrial fibrillation     On coumadin  . Coronary atherosclerosis of native coronary artery     Multivessel status post CABG  . Cardiomyopathy     LVEF 40-45% 01/2011  . Type 2  diabetes mellitus   . Essential hypertension, benign   . Hyperlipemia   . Gout   . S/P patent foramen ovale closure   . ESRD on hemodialysis     T/TH/SAT dialyis - Dr Hinda Lenis  . History of nuclear stress test 07/04/2010    dipyridamole; mod fixed inferolateral defect; non-diagnostic for ischemia; low risk scan   . Hyperbilirubinemia 08/10/2013   Past Surgical History  Procedure Laterality Date  . Mitral valve replacement  01/05/2007    Bioprosthetic 35mm Edwards precordial tissue (Dr. Servando Snare)  . Coronary artery bypass graft  01/05/2007    LIMA-LAD, SVG-OM, seq. SVG-PDA, PLA-RCA  . Colonoscopy  06/23/2011    Dr. Oneida Alar: multiple sessile and pedunculated polyps, moderate diverticulosis, internal hemorrhoids, +ADENOMATOUS POLYPS, consider genetic testing, surveillance in October 2015   . Esophagogastroduodenoscopy  02/12/2011    CHRONIC ACTIVE GASTRITIS, NO H. pylori  . Transthoracic echocardiogram  11/2011    EF 25% w/ mod dilatation of LV & mod LVH; LA severely dilated; mod-severe dilation of RV with mod-severe decrease in RV function; mild MR & TR  . Cardioversion  04/19/2007    Dr. Marella Chimes  . Transthoracic echocardiogram  02/14/2011    EF 40-45%, mod conc LVH; ventricular septum with motion showing abnormal function, dyssynergy, paradox; mild AS; mild-mod MR stenosis; LA severely dilated; aptrial septum bowed from L to R with increased LA pressure   . Amputation Right 07/14/2013    Procedure: AMPUTATION DIGIT;  Surgeon: [name], MD;  Location: [facility];  Service: General;  Laterality: Right;  . Amputation Right 08/08/2013    Procedure: AMPUTATION BELOW KNEE;  Surgeon: [name], MD;  Location: [facility];  Service: General;  Laterality: Right;  . Stump revision Right 09/19/2013    Procedure: STUMP REVISION;  Surgeon: Scherry Ran, MD;  Location: [facility];  Service: General;  Laterality: Right;  . Amputation Right 10/17/2013    Procedure: AMPUTATION ABOVE KNEE;  Surgeon:  [name], MD;  Location: [facility];  Service: General;  Laterality: Right;   Prior to Admission medications   Medication Sig Start Date End Date Taking? Authorizing Provider  allopurinol (ZYLOPRIM) 100 MG tablet Take 100 mg by mouth daily.  05/15/11  Yes Historical Provider, MD  amiodarone (PACERONE) 100 MG tablet Take 100 mg by mouth daily.   Yes Historical Provider, MD  aspirin EC 81 MG tablet Take 81 mg by mouth every evening.   Yes Historical Provider, MD  atorvastatin (LIPITOR) 20 MG tablet Take 20 mg by mouth every evening.  02/07/13  Yes Rebecca Eaton, MD  B Complex-C-Zn-Folic Acid (DIALYVITE/ZINC PO) Take by mouth 3 (three) times a week. Patient has dialysis on Tuesdays, Thursdays, and Saturdays   Yes Historical Provider, MD  diltiazem (CARDIZEM CD) 180 MG 24 hr capsule Take 1 capsule (180 mg total) by mouth daily. 07/18/13  Yes Kathie Dike, MD  folic acid (FOLVITE) 1 MG tablet Take 1 mg by mouth daily.     Yes Historical Provider, MD  insulin glargine (LANTUS) 100 UNIT/ML injection Inject 10 Units into the skin at bedtime.   Yes Historical Provider, MD  metoprolol tartrate (LOPRESSOR) 25 MG tablet Take 1 tablet (25 mg total) by mouth 3 (three) times daily. 08/13/13  Yes Nita Sells, MD  pantoprazole (PROTONIX) 40 MG tablet Take 40 mg by mouth 2 (two) times daily.  05/25/11  Yes Historical Provider, MD  RENVELA 800 MG tablet Take 800 mg by mouth 3 (three) times daily with meals.  05/03/11  Yes Historical Provider, MD  SENSIPAR 30 MG tablet Take 30 mg by mouth daily. 01/22/14  Yes Historical Provider, MD  traMADol (ULTRAM) 50 MG tablet Take by mouth every 8 (eight) hours as needed for moderate pain.   Yes Historical Provider, MD  traZODone (DESYREL) 50 MG tablet Take 1 tablet (50 mg total) by mouth at bedtime. 08/13/13  Yes Nita Sells, MD  warfarin (COUMADIN) 5 MG tablet Take 2.5-5 mg by mouth See admin instructions. TAKES ONE HALF TABLET EVERY DAY EXCEPT TAKE ONE-HALF  TABLET ON WEDNESDAYS   Yes Historical Provider, MD  ciprofloxacin (CIPRO) 500 MG tablet Take 500 mg by mouth daily. 02/13/14   Historical Provider, MD   Allergies  Allergen Reactions  . Bacitracin Other (See Comments)    unknown  . Norvasc [Amlodipine Besylate] Other (See Comments)    unknown    FAMILY HISTORY:  Family History  Problem Relation Age of Onset  . Diabetes Mother   . Diabetes Father   . Diabetes Sister   . Colon cancer Neg Hx    SOCIAL HISTORY:  reports that he quit smoking about 7 years ago. His smoking use included Cigarettes. He has a 8 pack-year smoking history. He has never used smokeless tobacco. He reports that he does not drink alcohol or use illicit drugs.  REVIEW OF SYSTEMS:   All systems reviewed and found negative except for what I mentioned in the HPI.  SUBJECTIVE:   VITAL SIGNS: Temp:  [97.9 F (36.6 C)] 97.9 F (36.6 C) (06/20 0100) Pulse Rate:  [41-110] 105 (06/20 0100) Resp:  [20-34] 29 (06/20 0145) BP: (82-138)/(36-93) 111/49 mmHg (06/20 0145) SpO2:  [6 %-95 %] 6 % (06/20 0045) Weight:  [193 lb 9 oz (87.8 kg)] 193 lb 9 oz (87.8 kg) (06/20 0100) HEMODYNAMICS:   VENTILATOR SETTINGS:   INTAKE / OUTPUT: Intake/Output     06/19 0701 - 06/20 0700   IV Piggyback 650   Total Intake(mL/kg) 650 (7.4)   Net +650         PHYSICAL EXAMINATION: General: Pleasant male patient in no acute distress. Eyes: Anicteric sclerae. ENT: Oropharynx clear. Dry mucous membranes. No thrush Lymph: No cervical, supraclavicular, or axillary lymphadenopathy. Heart: Irregular rhythm. No murmurs, rubs, or gallops appreciated. No bruits, equal pulses in upper extremities. Lungs: Diminished breaths sounds to the left base. Also crackles to the left base.Right lung clear to auscultation. Normal upper airway sounds without evidence of stridor. Abdomen: Abdomen soft, mildly tender, moderately distended and tympanic on percussion. Normoactive bowel sounds. No  hepatosplenomegaly or masses. Musculoskeletal: No clubbing or synovitis. Right AKA, no edema on left leg.  Skin: No rashes or lesions Neuro: No focal neurologic deficits.   LABS:  CBC  Recent Labs Lab 02/13/14 2001  WBC 9.7  HGB 13.1  HCT 40.3  PLT 239   Coag's  Recent Labs Lab 02/13/14 2022  INR 6.15*   BMET  Recent Labs Lab 02/13/14 2001  NA 140  K 5.7*  CL 91*  CO2 24  BUN 54*  CREATININE 9.42*  GLUCOSE 199*   Electrolytes  Recent Labs Lab 02/13/14 2001  CALCIUM 10.8*   Sepsis Markers  Recent Labs Lab 02/13/14 2306  LATICACIDVEN 4.6*   ABG  Recent Labs Lab 02/13/14 2235  PHART 7.406  PCO2ART 36.5  PO2ART 65.4*   Liver Enzymes  Recent Labs Lab 02/13/14 2001  AST 28  ALT 19  ALKPHOS 95  BILITOT 2.5*  ALBUMIN 2.7*   Cardiac Enzymes  Recent Labs Lab 02/13/14 2022  TROPONINI <0.30   Glucose  Recent Labs Lab 02/13/14 2330 02/14/14 0051  GLUCAP 238* 237*    Imaging Dg Chest 2 View  02/13/2014   CLINICAL DATA:  Shortness of breath and weakness.  EXAM: CHEST  2 VIEW  COMPARISON:  CT of the abdomen and pelvis performed earlier today at 9:41 p.m., and chest radiograph from 09/17/2013  FINDINGS: Trace right-sided pneumothorax is seen. A small to moderate right-sided pleural effusion is noted, with associated airspace opacification. This may reflect atelectasis or pneumonia. Mild left basilar opacity may also reflect pneumonia, given the appearance on CT.  The heart is borderline enlarged. The patient is status post median sternotomy, with evidence of prior CABG. No acute osseous abnormalities are seen.  IMPRESSION: 1. Trace right-sided pneumothorax seen, as noted on CT. 2. Small-to-moderate right-sided pleural effusion, with associated airspace opacification; this may reflect atelectasis or pneumonia. Mild left basilar pneumonia seen. 3. Borderline cardiomegaly.   Electronically Signed   By: Garald Balding M.D.   On: 02/13/2014 23:22    Ct Abdomen Pelvis W Contrast  02/13/2014   CLINICAL DATA:  Abdominal pain, end-stage renal disease  EXAM: CT ABDOMEN AND PELVIS WITH CONTRAST  TECHNIQUE: Multidetector CT imaging of the abdomen and pelvis was performed using the standard protocol following bolus administration of intravenous contrast.  CONTRAST:  100 mL Isovue  COMPARISON:  CT chest 12/06/2011  FINDINGS:  There is a chronic right pleural effusion with associated dense right basilar atelectasis within air bronchograms. Cannot exclude right lower lobe pneumonia. There is a small pneumothorax anterior to the right middle lobe. No pericardial fluid. Mild airspace disease in the lower left lower lobe could represent pneumonia or aspiration pneumonitis.  No focal hepatic lesion. The gallbladder, pancreas, spleen, adrenal glands, are normal. The kidneys show minimal enhancement. Cyst of the left kidney.  The stomach contains a moderate amount of oral contrast. The duodenum and jejunum are dilated with poor progression of the oral contrast. Proximal small bowel loops measure 4 cm. The most distal small bowel is near completely collapsed in the pelvis (images 78, series 2). At discrete transition point is not identified. There is gas within the wall of the cecum tip as seen on coronal image 51, series 4. The appendix appears normal. The ascending colon is collapsed. The transverse and descending colon are collapsed.  Abdominal aorta is heavily calcified. No retroperitoneal periportal lymphadenopathy.  No free fluid the pelvis. The prostate gland and bladder normal. No pelvic lymphadenopathy. There is bilateral inguinal hernias. No aggressive osseous lesion. There is chronic appearing compression fracture of the L1 vertebral body.  IMPRESSION: 1. Chronic right pleural effusion with associated right lower lobe atelectasis. Cannot exclude right lower lobe pneumonia. 2. Small pneumothorax is noted anterior to the right middle lobe. Recommend chest radiograph  to exclude a larger pneumothorax. 3. Pneumonitis versus pneumonia at the left lung base. Consider aspiration. 4. Small bowel obstruction pattern with change in caliber from the proximal small bowel to the distal small bowel with a poor progression oral contrast. No transition point identified. 5. Pneumatosis of the tip of the cecum. This could be a benign pneumatosis but cannot exclude ischemia. 6. Appendix is normal. Findings conveyed toCHRISTOPHER POLLINA on 02/13/2014  at22:02.   Electronically Signed   By: Suzy Bouchard M.D.   On: 02/13/2014 22:02     CXR:  IMPRESSION:  1. Trace right-sided pneumothorax seen, as noted on CT.  2. Small-to-moderate right-sided pleural effusion, with associated  airspace opacification; this may reflect atelectasis or pneumonia.  Mild left basilar pneumonia seen.  3. Borderline cardiomegaly.   ASSESSMENT / PLAN:  PULMONARY A: 1) Right chronic pleural effusion, unclear etiology, may be complex pleural effusion given presence of pneumothorax seen on CT and chest X ray. Will eventually need thoracentesis. Now has supratherapeutic INR. P:   - Continue supplemental O2 to keep O2 sat > 94% - Will eventually need diagnostic thoracentesis once INR is normal.  - Follow up chest X ray in am to assess stability of pneumothorax  CARDIOVASCULAR A:  1) Mild hypotension. Responding to fluids. Got 1 L of NS. Still looks volume depleted on exam. May tolerate extra fluids is hypotensive.  2) Lactic acidosis likely from SBO, volume depletion.  3) Cardiomyopathy with LVEF 15 to 20% with severe diastolic dysfunction. Stable. 4) Atrial fibrillation on coumadin, rate in the low 100's P:  - ICU monitoring - Continue gentle IVF resuscitation - Trend lactate - May need pressors.  - we will hold home oral antihypertensive and antiarrythmic medications including metoprolol, diltiazem, amiodarone  RENAL A:   1) ESRD on HD T / Th / Sat 2)  P:   - Nephrology consult in  am  - Will follow BMP - Replenish lytes as necessary.   GASTROINTESTINAL A:   1) History of UGI bleed 2) SBO, Surgery at bedside. Abdomen is distended but soft and mildly  tender. Surgery recommends conservative management for now. P:   - NPO - NGT placement - Will follow lactate - Antibiotics  HEMATOLOGIC A:   1) Supratherapeutic INR. No evidence of bleeding. No need for aggressive reversal.  P:  - Vit K 10 mg IV once - Will follow INR in am  INFECTIOUS A:   1) SBO, cecal pneumatosis 2) Bilateral lower lobes infiltrates, possible pneumonia / Aspiration P:   - Zosyn - Vancomycin  - Will follow blood and sputum cultures  ENDOCRINE A:   1) DM2   P:   - Novolog sliding scale per ICU hyperglycemia protocol  NEUROLOGIC A:   1) No issues P:    TODAY'S SUMMARY:   I have personally obtained a history, examined the patient, evaluated laboratory and imaging results, formulated the assessment and plan and placed orders. CRITICAL CARE: The patient is critically ill with multiple organ systems failure and requires high complexity decision making for assessment and support, frequent evaluation and titration of therapies, application of advanced monitoring technologies and extensive interpretation of multiple databases. Critical Care Time devoted to patient care services described in this note is 60 minutes.   Waynetta Pean, MD Pulmonary and [place] Pager: [phone]  02/14/2014, 2:14 AM

## 2014-02-14 NOTE — Progress Notes (Signed)
Dr. Jonnie Finner called and informed that pt was hypotensive and tachycardic during HD and was given 503cc of fluid during his Tx. Also had increased in ectopy and bath was changed to a 4K.

## 2014-02-14 NOTE — Progress Notes (Signed)
INITIAL NUTRITION ASSESSMENT  DOCUMENTATION CODES Per approved criteria  -Not Applicable   INTERVENTION: Supplement diet once advanced.   NUTRITION DIAGNOSIS: Increased nutrient needs related to hemodialysis as evidenced by estimated needs.   Goal: Diet advancement  Monitor:  Diet advancement, PO intake, weight trend, labs   Reason for Assessment: Pt identified as at nutrition risk on the Malnutrition Screen Tool  63 y.o. male  Admitting Dx: <principal problem not specified>  ASSESSMENT: Pt with hx of ESRD on HD admitted with N/V and abd distention. Per CT pt with SBO with small area of pneumatosis at the tip of the cecum and right chronic pleural effusion.  Plan for conservative management, NPO with NGT for decompression.   Per pt his dry weight has been stable. Pt does endorse decreased intake for the past week due to acute illness.  Sodium low. Potassium, Phosphorus, and glucose elevated.  Nutrition Focused Physical Exam WNL.    Height: Ht Readings from Last 1 Encounters:  02/14/14 6\' 4"  (1.93 m)    Weight: Wt Readings from Last 1 Encounters:  02/14/14 193 lb 9 oz (87.8 kg)    Ideal Body Weight: 85.8 kg  % Ideal Body Weight: 102%  Wt Readings from Last 10 Encounters:  02/14/14 193 lb 9 oz (87.8 kg)  12/17/13 225 lb (102.059 kg)  10/20/13 193 lb 9 oz (87.8 kg)  10/20/13 193 lb 9 oz (87.8 kg)  09/24/13 210 lb (95.255 kg)  09/24/13 210 lb (95.255 kg)  08/13/13 223 lb 5.2 oz (101.3 kg)  08/13/13 223 lb 5.2 oz (101.3 kg)  07/16/13 237 lb 10.5 oz (107.8 kg)  07/16/13 237 lb 10.5 oz (107.8 kg)    Usual Body Weight: 87.9 kg   % Usual Body Weight: 100%  BMI:  Body mass index is 23.57 kg/(m^2).  Estimated Nutritional Needs: Kcal: 2400-2600 Protein: 105-115 grams Fluid: >1.2 L/day  Skin: no issues noted  Diet Order: NPO  EDUCATION NEEDS: -No education needs identified at this time   Intake/Output Summary (Last 24 hours) at 02/14/14 0855 Last  data filed at 02/14/14 0600  Gross per 24 hour  Intake 824.67 ml  Output   1000 ml  Net -175.33 ml    Last BM: PTA   Labs:   Recent Labs Lab 02/13/14 2001 02/14/14 0324  NA 140 135*  K 5.7* 5.8*  CL 91* 89*  CO2 24 23  BUN 54* 58*  CREATININE 9.42* 9.63*  CALCIUM 10.8* 10.0  MG  --  1.8  PHOS  --  4.9*  GLUCOSE 199* 214*    CBG (last 3)   Recent Labs  02/14/14 0051 02/14/14 0329 02/14/14 0730  GLUCAP 237* 197* 190*   Lab Results  Component Value Date   HGBA1C 6.8* 08/05/2013   Scheduled Meds: . [START ON 02/15/2014] Chlorhexidine Gluconate Cloth  6 each Topical Q0600  . insulin aspart  2-6 Units Subcutaneous 6 times per day  . mupirocin ointment  1 application Nasal BID  . ondansetron      . pantoprazole (PROTONIX) IV  40 mg Intravenous QHS  . piperacillin-tazobactam (ZOSYN)  IV  2.25 g Intravenous Q8H  . sodium chloride  10-40 mL Intracatheter Q12H  . sodium chloride  3 mL Intravenous Q12H    Continuous Infusions:   Past Medical History  Diagnosis Date  . UGI bleed 02/12/11    On anticoagulation; EGD w/snare polypectomy-multiple polypoid lesions antrum(benign), Chronic active gastritis (NEGATIVE H pylori)  . Anemia, chronic disease   .  Sepsis due to enterococcus 02/11/11  . Atrial fibrillation     On coumadin  . Coronary atherosclerosis of native coronary artery     Multivessel status post CABG  . Cardiomyopathy     LVEF 40-45% 01/2011  . Type 2 diabetes mellitus   . Essential hypertension, benign   . Hyperlipemia   . Gout   . S/P patent foramen ovale closure   . ESRD on hemodialysis     T/TH/SAT dialyis - Dr Hinda Lenis  . History of nuclear stress test 07/04/2010    dipyridamole; mod fixed inferolateral defect; non-diagnostic for ischemia; low risk scan   . Hyperbilirubinemia 08/10/2013    Past Surgical History  Procedure Laterality Date  . Mitral valve replacement  01/05/2007    Bioprosthetic 73mm Edwards precordial tissue (Dr. Servando Snare)  .  Coronary artery bypass graft  01/05/2007    LIMA-LAD, SVG-OM, seq. SVG-PDA, PLA-RCA  . Colonoscopy  06/23/2011    Dr. Oneida Alar: multiple sessile and pedunculated polyps, moderate diverticulosis, internal hemorrhoids, +ADENOMATOUS POLYPS, consider genetic testing, surveillance in October 2015   . Esophagogastroduodenoscopy  02/12/2011    CHRONIC ACTIVE GASTRITIS, NO H. pylori  . Transthoracic echocardiogram  11/2011    EF 25% w/ mod dilatation of LV & mod LVH; LA severely dilated; mod-severe dilation of RV with mod-severe decrease in RV function; mild MR & TR  . Cardioversion  04/19/2007    Dr. Marella Chimes  . Transthoracic echocardiogram  02/14/2011    EF 40-45%, mod conc LVH; ventricular septum with motion showing abnormal function, dyssynergy, paradox; mild AS; mild-mod MR stenosis; LA severely dilated; aptrial septum bowed from L to R with increased LA pressure   . Amputation Right 07/14/2013    Procedure: AMPUTATION DIGIT;  Surgeon: [name], MD;  Location: [facility];  Service: General;  Laterality: Right;  . Amputation Right 08/08/2013    Procedure: AMPUTATION BELOW KNEE;  Surgeon: [name], MD;  Location: [facility];  Service: General;  Laterality: Right;  . Stump revision Right 09/19/2013    Procedure: STUMP REVISION;  Surgeon: Scherry Ran, MD;  Location: [facility];  Service: General;  Laterality: Right;  . Amputation Right 10/17/2013    Procedure: AMPUTATION ABOVE KNEE;  Surgeon: [name], MD;  Location: [facility];  Service: General;  Laterality: Right;    Maylon Peppers [place], [place], [place] Pager [phone] After Hours Pager

## 2014-02-14 NOTE — Progress Notes (Signed)
CRITICAL VALUE ALERT  Critical value received:  INR= 8.82  Date of notification:  t  Time of notification: 0630  Critical value read back:yes  Nurse who received alert:  J. PERRIN, RN  MD notified (1st page):  Dr Tamala Julian ([place])  Time of first page: [phone]  MD notified (2nd page):  Time of second page:  Responding MD:  Dr Tamala Julian Margaree Mackintosh)  Time MD responded: [phone]

## 2014-02-14 NOTE — Progress Notes (Signed)
Order received for placement of NGT to LIWS for abdominal decompression; Procedure explained to Pt covering insertion and necessity for decompression, Pt stated understanding. 16FR [place] NGT placed via Rt nare without difficulty times first attempt and advanced into the stomach with immediate return of bile fluid, placement checked by air bolus, NGT secured and attached to LIWS with immediate return of approx. 850 cc of bile; PCXR was then completed to verify placement. Pt tolerated proceduer without difficulty, will cont' to monitor and assess.

## 2014-02-14 NOTE — Progress Notes (Addendum)
ANTIBIOTIC CONSULT NOTE - INITIAL  Pharmacy Consult for vancomycin and zosyn Indication: pneumonia, intraabdominal infection, sepsis  Allergies  Allergen Reactions  . Bacitracin Other (See Comments)    unknown  . Norvasc [Amlodipine Besylate] Other (See Comments)    unknown    Patient Measurements: Height: 6\' 4"  (193 cm) Weight: 193 lb 9 oz (87.8 kg) IBW/kg (Calculated) : 86.8   Vital Signs: Temp: 97.9 F (36.6 C) (06/20 0100) Temp src: Oral (06/20 0100) BP: 111/49 mmHg (06/20 0145) Pulse Rate: 105 (06/20 0100) Intake/Output from previous day: 06/19 0701 - 06/20 0700 In: 650 [IV Piggyback:650] Out: -  Intake/Output from this shift: Total I/O In: 650 [IV Piggyback:650] Out: -   Labs:  Recent Labs  02/13/14 2001  WBC 9.7  HGB 13.1  PLT 239  CREATININE 9.42*   Estimated Creatinine Clearance: 9.6 ml/min (by C-G formula based on Cr of 9.42). No results found for this basename: VANCOTROUGH, VANCOPEAK, VANCORANDOM, GENTTROUGH, GENTPEAK, GENTRANDOM, TOBRATROUGH, TOBRAPEAK, TOBRARND, AMIKACINPEAK, AMIKACINTROU, AMIKACIN,  in the last 72 hours   Microbiology: No results found for this or any previous visit (from the past 720 hour(s)).  Medical History: Past Medical History  Diagnosis Date  . UGI bleed 02/12/11    On anticoagulation; EGD w/snare polypectomy-multiple polypoid lesions antrum(benign), Chronic active gastritis (NEGATIVE H pylori)  . Anemia, chronic disease   . Sepsis due to enterococcus 02/11/11  . Atrial fibrillation     On coumadin  . Coronary atherosclerosis of native coronary artery     Multivessel status post CABG  . Cardiomyopathy     LVEF 40-45% 01/2011  . Type 2 diabetes mellitus   . Essential hypertension, benign   . Hyperlipemia   . Gout   . S/P patent foramen ovale closure   . ESRD on hemodialysis     T/TH/SAT dialyis - Dr Hinda Lenis  . History of nuclear stress test 07/04/2010    dipyridamole; mod fixed inferolateral defect;  non-diagnostic for ischemia; low risk scan   . Hyperbilirubinemia 08/10/2013    Medications:  Prescriptions prior to admission  Medication Sig Dispense Refill  . allopurinol (ZYLOPRIM) 100 MG tablet Take 100 mg by mouth daily.       Marland Kitchen amiodarone (PACERONE) 100 MG tablet Take 100 mg by mouth daily.      Marland Kitchen aspirin EC 81 MG tablet Take 81 mg by mouth every evening.      Marland Kitchen atorvastatin (LIPITOR) 20 MG tablet Take 20 mg by mouth every evening.       . B Complex-C-Zn-Folic Acid (DIALYVITE/ZINC PO) Take by mouth 3 (three) times a week. Patient has dialysis on Tuesdays, Thursdays, and Saturdays      . diltiazem (CARDIZEM CD) 180 MG 24 hr capsule Take 1 capsule (180 mg total) by mouth daily.  30 capsule  1  . folic acid (FOLVITE) 1 MG tablet Take 1 mg by mouth daily.        . insulin glargine (LANTUS) 100 UNIT/ML injection Inject 10 Units into the skin at bedtime.      . metoprolol tartrate (LOPRESSOR) 25 MG tablet Take 1 tablet (25 mg total) by mouth 3 (three) times daily.      . pantoprazole (PROTONIX) 40 MG tablet Take 40 mg by mouth 2 (two) times daily.       Marland Kitchen RENVELA 800 MG tablet Take 800 mg by mouth 3 (three) times daily with meals.       . SENSIPAR 30 MG tablet Take 30 mg  by mouth daily.      . traMADol (ULTRAM) 50 MG tablet Take by mouth every 8 (eight) hours as needed for moderate pain.      . traZODone (DESYREL) 50 MG tablet Take 1 tablet (50 mg total) by mouth at bedtime.  30 tablet  0  . warfarin (COUMADIN) 5 MG tablet Take 2.5-5 mg by mouth See admin instructions. TAKES ONE HALF TABLET EVERY DAY EXCEPT TAKE ONE-HALF TABLET ON WEDNESDAYS      . ciprofloxacin (CIPRO) 500 MG tablet Take 500 mg by mouth daily.       Assessment: 63 yo man to start broad spectrum antibiotics for possible PNA, r/o sepsis, r/o abdominal infection.  He is ESRD on HD TThS.  He received vancomycin 1500 mg IV at [facility].  Goal of Therapy:  preHD vanc level 20-25 mg/L  Plan:  Zosyn 2.25 gm IV q8 hours F/u plans  for HD to redose vanc. F/u clinical course, cultures and preHD vanc level when appropriate.  Thanks for allowing pharmacy to be a part of this patient's care.  Excell Seltzer, PharmD Clinical Pharmacist, [phone] 02/14/2014,2:12 AM  02/14/2014 12:05 PM For HD today will give 1g vancomycin after HD. [place]

## 2014-02-14 NOTE — Progress Notes (Signed)
Pt admitted from [facility] Via [facility] stretcher w/ O2, monitor and [facility] staff; Pt assisted to hospital bed and attached to unit monitors and O2; Pt introduced to staff and unit, MRSA swab done and 6 cloth CHG bath complete. Questions answered and support give, Admitting and consulting physicians notified of Pt's arrival.

## 2014-02-14 NOTE — Progress Notes (Signed)
I saw the patient, participated in the history, exam and medical decision making, and concur with the physician assistant's note above.  Reports less pain. No flatus. No fever. Lactate trending down.   Alert, nad Mild distension. Mild TTP in lower abd - suprapubic. No rt/guarding/peritonitis.   Repeat films in am Cont bowel rest & nonop mgmt for now since not worsening.  See PA note  Leighton Ruff. Redmond Pulling, MD, FACS General, Bariatric, & Minimally Invasive Surgery [facility], [place]

## 2014-02-14 NOTE — Consult Note (Signed)
Renal Service Consult Note [facility]  JAIVIAN BATTAGLINI 02/14/2014 Roney Jaffe D Requesting Physician:  Dr Nelda Marseille  Reason for Consult:  ESRD pt with SBO HPI: The patient is a 63 y.o. year-old with hx of CM EF 15%, ESRD x 94yrs, DM2, afib on coumadin and R BKA.  He gets HD in [place].  Pt presented with 1 week hx of N/V and xrays and CT scan show a SBO.  Also there is a large R effusion, probable R lower lung consolidation and some patchy changes L lung base by CT suggestive of PNA.  Pt denies cough, fevers, CP. Mild abd pain.    No recent problems with HD or AVF.  HD staff says he will run BP's in the 90's routinely and not have any problems.  ROS  no jt pain  no skin rash  no HA  Past Medical History  Past Medical History  Diagnosis Date  . UGI bleed 02/12/11    On anticoagulation; EGD w/snare polypectomy-multiple polypoid lesions antrum(benign), Chronic active gastritis (NEGATIVE H pylori)  . Anemia, chronic disease   . Sepsis due to enterococcus 02/11/11  . Atrial fibrillation     On coumadin  . Coronary atherosclerosis of native coronary artery     Multivessel status post CABG  . Cardiomyopathy     LVEF 40-45% 01/2011  . Type 2 diabetes mellitus   . Essential hypertension, benign   . Hyperlipemia   . Gout   . S/P patent foramen ovale closure   . ESRD on hemodialysis     Started dialysis sometime around 2008-2009.  Gets HD in [place], [place] with Dr Hinda Lenis on a TTS schedule.  Runs 3.5 hours with dry wt 86.5 kg as of June 2015.  Had a L arm AVF which didn't work and has been using his R forearm AVF for several years.    . History of nuclear stress test 07/04/2010    dipyridamole; mod fixed inferolateral defect; non-diagnostic for ischemia; low risk scan   . Hyperbilirubinemia 08/10/2013   Past Surgical History  Past Surgical History  Procedure Laterality Date  . Mitral valve replacement  01/05/2007    Bioprosthetic 49mm Edwards precordial tissue (Dr. Servando Snare)  .  Coronary artery bypass graft  01/05/2007    LIMA-LAD, SVG-OM, seq. SVG-PDA, PLA-RCA  . Colonoscopy  06/23/2011    Dr. Oneida Alar: multiple sessile and pedunculated polyps, moderate diverticulosis, internal hemorrhoids, +ADENOMATOUS POLYPS, consider genetic testing, surveillance in October 2015   . Esophagogastroduodenoscopy  02/12/2011    CHRONIC ACTIVE GASTRITIS, NO H. pylori  . Transthoracic echocardiogram  11/2011    EF 25% w/ mod dilatation of LV & mod LVH; LA severely dilated; mod-severe dilation of RV with mod-severe decrease in RV function; mild MR & TR  . Cardioversion  04/19/2007    Dr. Marella Chimes  . Transthoracic echocardiogram  02/14/2011    EF 40-45%, mod conc LVH; ventricular septum with motion showing abnormal function, dyssynergy, paradox; mild AS; mild-mod MR stenosis; LA severely dilated; aptrial septum bowed from L to R with increased LA pressure   . Amputation Right 07/14/2013    Procedure: AMPUTATION DIGIT;  Surgeon: [name], MD;  Location: [facility];  Service: General;  Laterality: Right;  . Amputation Right 08/08/2013    Procedure: AMPUTATION BELOW KNEE;  Surgeon: [name], MD;  Location: [facility];  Service: General;  Laterality: Right;  . Stump revision Right 09/19/2013    Procedure: STUMP REVISION;  Surgeon: Gwyndolyn Saxon  Hulan Amato, MD;  Location: [facility];  Service: General;  Laterality: Right;  . Amputation Right 10/17/2013    Procedure: AMPUTATION ABOVE KNEE;  Surgeon: [name], MD;  Location: [facility];  Service: General;  Laterality: Right;   Family History  Family History  Problem Relation Age of Onset  . Diabetes Mother   . Diabetes Father   . Diabetes Sister   . Colon cancer Neg Hx    Social History  reports that he quit smoking about 7 years ago. His smoking use included Cigarettes. He has a 8 pack-year smoking history. He has never used smokeless tobacco. He reports that he does not drink alcohol or use illicit drugs. Allergies  Allergies  Allergen  Reactions  . Bacitracin Other (See Comments)    unknown  . Norvasc [Amlodipine Besylate] Other (See Comments)    unknown   Home medications Prior to Admission medications   Medication Sig Start Date End Date Taking? Authorizing Dimonique Bourdeau  allopurinol (ZYLOPRIM) 100 MG tablet Take 100 mg by mouth daily.  05/15/11  Yes Historical Lajean Boese, MD  amiodarone (PACERONE) 100 MG tablet Take 100 mg by mouth daily.   Yes Historical Keisy Strickler, MD  aspirin EC 81 MG tablet Take 81 mg by mouth every evening.   Yes Historical Joscelynn Brutus, MD  atorvastatin (LIPITOR) 20 MG tablet Take 20 mg by mouth every evening.  02/07/13  Yes Rebecca Eaton, MD  B Complex-C-Zn-Folic Acid (DIALYVITE/ZINC PO) Take by mouth 3 (three) times a week. Patient has dialysis on Tuesdays, Thursdays, and Saturdays   Yes Historical Dakwon Wenberg, MD  diltiazem (CARDIZEM CD) 180 MG 24 hr capsule Take 1 capsule (180 mg total) by mouth daily. 07/18/13  Yes Kathie Dike, MD  folic acid (FOLVITE) 1 MG tablet Take 1 mg by mouth daily.     Yes Historical Rodrigo Mcgranahan, MD  insulin glargine (LANTUS) 100 UNIT/ML injection Inject 10 Units into the skin at bedtime.   Yes Historical Kaedin Hicklin, MD  metoprolol tartrate (LOPRESSOR) 25 MG tablet Take 1 tablet (25 mg total) by mouth 3 (three) times daily. 08/13/13  Yes Nita Sells, MD  pantoprazole (PROTONIX) 40 MG tablet Take 40 mg by mouth 2 (two) times daily.  05/25/11  Yes Historical Kendyll Huettner, MD  RENVELA 800 MG tablet Take 800 mg by mouth 3 (three) times daily with meals.  05/03/11  Yes Historical Regan Llorente, MD  SENSIPAR 30 MG tablet Take 30 mg by mouth daily. 01/22/14  Yes Historical Asya Derryberry, MD  traMADol (ULTRAM) 50 MG tablet Take by mouth every 8 (eight) hours as needed for moderate pain.   Yes Historical Zarriah Starkel, MD  traZODone (DESYREL) 50 MG tablet Take 1 tablet (50 mg total) by mouth at bedtime. 08/13/13  Yes Nita Sells, MD  warfarin (COUMADIN) 5 MG tablet Take 2.5-5 mg by mouth See admin  instructions. TAKES ONE HALF TABLET EVERY DAY EXCEPT TAKE ONE-HALF TABLET ON WEDNESDAYS   Yes Historical Amity Roes, MD  ciprofloxacin (CIPRO) 500 MG tablet Take 500 mg by mouth daily. 02/13/14   Historical Artez Regis, MD   Liver Function Tests  Recent Labs Lab 02/13/14 2001  AST 28  ALT 19  ALKPHOS 95  BILITOT 2.5*  PROT 8.2  ALBUMIN 2.7*    Recent Labs Lab 02/13/14 2022  LIPASE 39   CBC  Recent Labs Lab 02/13/14 2001 02/14/14 0324  WBC 9.7 10.8*  NEUTROABS 7.3  --   HGB 13.1 10.6*  HCT 40.3 32.3*  MCV 92.6 90.2  PLT 239 224  Basic Metabolic Panel  Recent Labs Lab 02/13/14 2001 02/14/14 0324  NA 140 135*  K 5.7* 5.8*  CL 91* 89*  CO2 24 23  GLUCOSE 199* 214*  BUN 54* 58*  CREATININE 9.42* 9.63*  CALCIUM 10.8* 10.0  PHOS  --  4.9*    Filed Vitals:   02/14/14 0736 02/14/14 0800 02/14/14 0900 02/14/14 1000  BP:  111/85 100/76 104/63  Pulse:      Temp: 98.3 F (36.8 C)     TempSrc: Oral     Resp:  26 29 30   Height:      Weight:      SpO2:  96% 93% 97%   Exam: Older adult male w NG in place, not in distress, calm No rash, cyanosis or gangrene Sclera anicteric, throat clear No jvd Chest clear bilat Cor tachy, irregular, no MRG Abd soft, NTND, +BS, no ascites or HSM No LE edema, R BKA, no ulcers or gangrene Neuro is nf, Ox 3 R forearm AVF +bruit   HD: [facility] TTS 3.5h   87kg   2K/2.5 Bath  Heparin none    Hectorol 1 ug TIW BP usually runs in the 90's w/o symptoms per RN  Assessment: 1 Nausea / vomiting / SBO by CT- Gen Surg evaluating 2 Chronic R pleural effusion / bibasilar infiltrates- by CT, per CCM 3 ESRD on HD x 7 years 4 Afib on amio, coumadin, BB, diltiazem 5 CM EF 15% 6 DM 2 on insulin 7 HTPH cont meds 8 Anemia not on esa at this time 9 R BKA 10 Mild hyperkalemia 11 Hypotension- at outpt HD, can run usual BP in 90's 12 Volume - no vol excess on exam  Plan- HD today in ICU, no UF   Kelly Splinter MD (pgr) [phone]    (c)  [phone] 02/14/2014, 11:11 AM

## 2014-02-14 NOTE — Progress Notes (Signed)
[facility] Progress Note     Subjective: Pt doing much better.  No further N/V, high bilious output from NG.  Abdominal pain in lower abdomen still present but improved.  Wife and son at bedside.  NPO.  Last recorded BM yesterday.  None today, not passing flatus.  I discussed with him and wife/son at bedside about abdominal problems.  He denies any abdominal infections, h/o IBD, abdominal trauma, hernias, or abdominal surgeries.  He and his wife say he's never had an abdominal incision/scar.  No family h/o abdominal problems or cancer.    Objective: Vital signs in last 24 hours: Temp:  [97.9 F (36.6 C)-98.5 F (36.9 C)] 98.3 F (36.8 C) (06/20 0736) Pulse Rate:  [26-110] 42 (06/20 0700) Resp:  [19-34] 30 (06/20 1000) BP: (82-138)/(36-93) 104/63 mmHg (06/20 1000) SpO2:  [6 %-99 %] 97 % (06/20 1000) Weight:  [193 lb 9 oz (87.8 kg)] 193 lb 9 oz (87.8 kg) (06/20 0100) Last BM Date: 02/13/14  Intake/Output from previous day: 06/19 0701 - 06/20 0700 In: 834.7 [I.V.:84.7; IV Piggyback:750] Out: 1000 [Emesis/NG output:1000] Intake/Output this shift: Total I/O In: 80 [I.V.:30; IV Piggyback:50] Out: -   PE: Gen:  Alert, NAD, pleasant Card:  RRR, no M/G/R heard Pulm:  CTA, no W/R/R Abd: Soft, distended, tender in the lower abdomen, diminished BS, no HSM, small umbilical hernia palpated and seen on CT containing a small amount of fat, no abdominal scars noted   Lab Results:   Recent Labs  02/13/14 2001 02/14/14 0324  WBC 9.7 10.8*  HGB 13.1 10.6*  HCT 40.3 32.3*  PLT 239 224   BMET  Recent Labs  02/13/14 2001 02/14/14 0324  NA 140 135*  K 5.7* 5.8*  CL 91* 89*  CO2 24 23  GLUCOSE 199* 214*  BUN 54* 58*  CREATININE 9.42* 9.63*  CALCIUM 10.8* 10.0   PT/INR  Recent Labs  02/14/14 0431 02/14/14 0900  LABPROT 68.5* 21.2*  INR 8.82* 1.90*   CMP     Component Value Date/Time   NA 135* 02/14/2014 0324   K 5.8* 02/14/2014 0324   K 4.4 05/04/2011 1426    CL 89* 02/14/2014 0324   CL 101 05/04/2011 1427   CO2 23 02/14/2014 0324   CO2 25 05/04/2011 1427   GLUCOSE 214* 02/14/2014 0324   BUN 58* 02/14/2014 0324   CREATININE 9.63* 02/14/2014 0324   CALCIUM 10.0 02/14/2014 0324   CALCIUM 8.8 05/04/2011 1426   PROT 8.2 02/13/2014 2001   PROT 6.4 05/04/2011 1427   ALBUMIN 2.7* 02/13/2014 2001   AST 28 02/13/2014 2001   AST 22 05/04/2011 1427   ALT 19 02/13/2014 2001   ALKPHOS 95 02/13/2014 2001   BILITOT 2.5* 02/13/2014 2001   GFRNONAA 5* 02/14/2014 0324   GFRAA 6* 02/14/2014 0324   Lipase     Component Value Date/Time   LIPASE 39 02/13/2014 2022       Studies/Results: Dg Chest 2 View  02/13/2014   CLINICAL DATA:  Shortness of breath and weakness.  EXAM: CHEST  2 VIEW  COMPARISON:  CT of the abdomen and pelvis performed earlier today at 9:41 p.m., and chest radiograph from 09/17/2013  FINDINGS: Trace right-sided pneumothorax is seen. A small to moderate right-sided pleural effusion is noted, with associated airspace opacification. This may reflect atelectasis or pneumonia. Mild left basilar opacity may also reflect pneumonia, given the appearance on CT.  The heart is borderline enlarged. The patient is status post  median sternotomy, with evidence of prior CABG. No acute osseous abnormalities are seen.  IMPRESSION: 1. Trace right-sided pneumothorax seen, as noted on CT. 2. Small-to-moderate right-sided pleural effusion, with associated airspace opacification; this may reflect atelectasis or pneumonia. Mild left basilar pneumonia seen. 3. Borderline cardiomegaly.   Electronically Signed   By: Garald Balding M.D.   On: 02/13/2014 23:22   Ct Abdomen Pelvis W Contrast  02/13/2014   CLINICAL DATA:  Abdominal pain, end-stage renal disease  EXAM: CT ABDOMEN AND PELVIS WITH CONTRAST  TECHNIQUE: Multidetector CT imaging of the abdomen and pelvis was performed using the standard protocol following bolus administration of intravenous contrast.  CONTRAST:  100 mL Isovue   COMPARISON:  CT chest 12/06/2011  FINDINGS: There is a chronic right pleural effusion with associated dense right basilar atelectasis within air bronchograms. Cannot exclude right lower lobe pneumonia. There is a small pneumothorax anterior to the right middle lobe. No pericardial fluid. Mild airspace disease in the lower left lower lobe could represent pneumonia or aspiration pneumonitis.  No focal hepatic lesion. The gallbladder, pancreas, spleen, adrenal glands, are normal. The kidneys show minimal enhancement. Cyst of the left kidney.  The stomach contains a moderate amount of oral contrast. The duodenum and jejunum are dilated with poor progression of the oral contrast. Proximal small bowel loops measure 4 cm. The most distal small bowel is near completely collapsed in the pelvis (images 78, series 2). At discrete transition point is not identified. There is gas within the wall of the cecum tip as seen on coronal image 51, series 4. The appendix appears normal. The ascending colon is collapsed. The transverse and descending colon are collapsed.  Abdominal aorta is heavily calcified. No retroperitoneal periportal lymphadenopathy.  No free fluid the pelvis. The prostate gland and bladder normal. No pelvic lymphadenopathy. There is bilateral inguinal hernias. No aggressive osseous lesion. There is chronic appearing compression fracture of the L1 vertebral body.  IMPRESSION: 1. Chronic right pleural effusion with associated right lower lobe atelectasis. Cannot exclude right lower lobe pneumonia. 2. Small pneumothorax is noted anterior to the right middle lobe. Recommend chest radiograph to exclude a larger pneumothorax. 3. Pneumonitis versus pneumonia at the left lung base. Consider aspiration. 4. Small bowel obstruction pattern with change in caliber from the proximal small bowel to the distal small bowel with a poor progression oral contrast. No transition point identified. 5. Pneumatosis of the tip of the cecum.  This could be a benign pneumatosis but cannot exclude ischemia. 6. Appendix is normal. Findings conveyed toCHRISTOPHER POLLINA on 02/13/2014  at22:02.   Electronically Signed   By: Suzy Bouchard M.D.   On: 02/13/2014 22:02   Dg Chest Port 1 View  02/14/2014   CLINICAL DATA:  Central line and nasogastric tube placement.  EXAM: PORTABLE CHEST - 1 VIEW  COMPARISON:  Chest radiograph from 02/13/2014  FINDINGS: The patient's left IJ line is seen ending about the mid to distal SVC. An enteric tube is noted extending below the diaphragm.  There is a mildly worsened small to moderate right-sided pleural effusion. The previously noted trace right pneumothorax is suggested but not well seen. Worsening bibasilar airspace opacification may reflect worsening pneumonia. Asymmetric interstitial edema might have a similar appearance. Underlying vascular congestion is seen.  The cardiomediastinal silhouette is enlarged. The patient status post median sternotomy, with evidence of prior CABG. No acute osseous abnormalities are seen.  IMPRESSION: 1. Left IJ line seen ending about the mid to distal SVC. 2.  Enteric tube noted extending below the diaphragm. 3. Mildly worsened small to moderate right-sided pleural effusion. Previously noted trace right pneumothorax is suggested but not well seen. 4. Worsening bibasilar airspace opacification may reflect worsening pneumonia. Asymmetric interstitial edema might have a similar appearance. 5. Underlying vascular congestion and cardiomegaly noted.   Electronically Signed   By: Garald Balding M.D.   On: 02/14/2014 03:04    Anti-infectives: Anti-infectives   Start     Dose/Rate Route Frequency Ordered Stop   02/14/14 0230  piperacillin-tazobactam (ZOSYN) IVPB 2.25 g     2.25 g 100 mL/hr over 30 Minutes Intravenous Every 8 hours 02/14/14 0219     02/13/14 2330  vancomycin (VANCOCIN) 1,500 mg in sodium chloride 0.9 % 500 mL IVPB     1,500 mg 250 mL/hr over 120 Minutes Intravenous   Once 02/13/14 2251 02/14/14 0124   02/13/14 2300  piperacillin-tazobactam (ZOSYN) IVPB 3.375 g  Status:  Discontinued     3.375 g 100 mL/hr over 30 Minutes Intravenous  Once 02/13/14 2250 02/14/14 0219   02/13/14 2259  vancomycin (VANCOCIN) 1 GM/200ML IVPB  Status:  Discontinued    Comments:  Eulis Manly   : cabinet override      02/13/14 2259 02/13/14 2304   02/13/14 2245  levofloxacin (LEVAQUIN) IVPB 750 mg     750 mg 100 mL/hr over 90 Minutes Intravenous  Once 02/13/14 2238 02/14/14 0022       Assessment/Plan SBO without obvious cause - no h/o hernias, abdominal surgery, infections, or trauma Cecal pneumatosis - may be incidental Mildly elevated lactate AFIB Chronic anticoagulation with INR 8.82 upon admission - now 1.9 Cardiomyopathy with LVEF 15-20% ESRD on HD Tu/Th/Sa  Plan: 1.  Conservative management - NPO, NG tube LIWS, IVF, pain control, antiemetics 2.  Serial exams/xrays/labs 3.  Quite unusual given no h/o abdominal trauma, infection, hernias, or surgeries that he would have a bowel obstruction.  Unsure of etiology at this time.  No urgent needs for surgery at this point.  Poor surgical candidate.  Hopefully will resolve conservatively without surgical intervention. 4.  SCD's, hold coumadin, lovenox or heparin okay for DVT prophylaxis for now 5.  Will follow.  Will need colonoscopy in 6 weeks from now once this resolves for further evaluation and to r/o cancer.    LOS: 1 day    Coralie Keens 02/14/2014, 11:52 AM Pager: [phone]

## 2014-02-15 ENCOUNTER — Inpatient Hospital Stay (HOSPITAL_COMMUNITY): Payer: Medicare Other

## 2014-02-15 DIAGNOSIS — J690 Pneumonitis due to inhalation of food and vomit: Secondary | ICD-10-CM

## 2014-02-15 DIAGNOSIS — E1129 Type 2 diabetes mellitus with other diabetic kidney complication: Secondary | ICD-10-CM

## 2014-02-15 DIAGNOSIS — K6389 Other specified diseases of intestine: Secondary | ICD-10-CM

## 2014-02-15 DIAGNOSIS — N186 End stage renal disease: Secondary | ICD-10-CM

## 2014-02-15 DIAGNOSIS — R6520 Severe sepsis without septic shock: Secondary | ICD-10-CM

## 2014-02-15 DIAGNOSIS — A419 Sepsis, unspecified organism: Secondary | ICD-10-CM

## 2014-02-15 DIAGNOSIS — R6521 Severe sepsis with septic shock: Secondary | ICD-10-CM

## 2014-02-15 LAB — BASIC METABOLIC PANEL WITH GFR
BUN: 35 mg/dL — ABNORMAL HIGH (ref 6–23)
CO2: 27 meq/L (ref 19–32)
Calcium: 10.2 mg/dL (ref 8.4–10.5)
Chloride: 95 meq/L — ABNORMAL LOW (ref 96–112)
Creatinine, Ser: 6.63 mg/dL — ABNORMAL HIGH (ref 0.50–1.35)
GFR calc Af Amer: 9 mL/min — ABNORMAL LOW
GFR calc non Af Amer: 8 mL/min — ABNORMAL LOW
Glucose, Bld: 130 mg/dL — ABNORMAL HIGH (ref 70–99)
Potassium: 4.9 meq/L (ref 3.7–5.3)
Sodium: 140 meq/L (ref 137–147)

## 2014-02-15 LAB — GLUCOSE, CAPILLARY
GLUCOSE-CAPILLARY: 106 mg/dL — AB (ref 70–99)
GLUCOSE-CAPILLARY: 124 mg/dL — AB (ref 70–99)
Glucose-Capillary: 104 mg/dL — ABNORMAL HIGH (ref 70–99)
Glucose-Capillary: 109 mg/dL — ABNORMAL HIGH (ref 70–99)
Glucose-Capillary: 124 mg/dL — ABNORMAL HIGH (ref 70–99)
Glucose-Capillary: 137 mg/dL — ABNORMAL HIGH (ref 70–99)
Glucose-Capillary: 93 mg/dL (ref 70–99)

## 2014-02-15 LAB — MAGNESIUM: MAGNESIUM: 2 mg/dL (ref 1.5–2.5)

## 2014-02-15 LAB — CBC
HCT: 29.7 % — ABNORMAL LOW (ref 39.0–52.0)
Hemoglobin: 9.6 g/dL — ABNORMAL LOW (ref 13.0–17.0)
MCH: 29.4 pg (ref 26.0–34.0)
MCHC: 32.3 g/dL (ref 30.0–36.0)
MCV: 90.8 fL (ref 78.0–100.0)
PLATELETS: 205 10*3/uL (ref 150–400)
RBC: 3.27 MIL/uL — ABNORMAL LOW (ref 4.22–5.81)
RDW: 15.9 % — ABNORMAL HIGH (ref 11.5–15.5)
WBC: 9.2 10*3/uL (ref 4.0–10.5)

## 2014-02-15 LAB — PROTIME-INR
INR: 1.32 (ref 0.00–1.49)
Prothrombin Time: 16.1 seconds — ABNORMAL HIGH (ref 11.6–15.2)

## 2014-02-15 LAB — LACTIC ACID, PLASMA: Lactic Acid, Venous: 1.2 mmol/L (ref 0.5–2.2)

## 2014-02-15 LAB — PHOSPHORUS: Phosphorus: 5.2 mg/dL — ABNORMAL HIGH (ref 2.3–4.6)

## 2014-02-15 NOTE — Progress Notes (Signed)
[facility] Progress Note     Subjective: 1119mL/24hr NG output.  Pt's pain and abdominal distension improving.  He's noted some flatus, no BM.  Feels overall much better today.  Obviously hoping he doesn't have to have surgery.  Objective: Vital signs in last 24 hours: Temp:  [97.3 F (36.3 C)-98.6 F (37 C)] 98.1 F (36.7 C) (06/21 0712) Pulse Rate:  [33-139] 111 (06/21 0900) Resp:  [16-30] 16 (06/21 0900) BP: (82-113)/(43-85) 98/60 mmHg (06/21 0900) SpO2:  [91 %-100 %] 96 % (06/21 0900) Weight:  [190 lb 4.1 oz (86.3 kg)-193 lb 9 oz (87.8 kg)] 190 lb 4.1 oz (86.3 kg) (06/21 0400) Last BM Date: 02/12/14  Intake/Output from previous day: 06/20 0701 - 06/21 0700 In: 808.3 [P.O.:150; I.V.:298.3; NG/GT:60; IV Piggyback:300] Out: 597 [Emesis/NG output:1100] Intake/Output this shift: Total I/O In: 20 [I.V.:20] Out: 100 [Emesis/NG output:100]  PE: Gen: Alert, NAD, pleasant  Abd: Soft, less distended, minimal tenderness in the lower abdomen, maybe a few BS heard, no HSM, small umbilical hernia palpated and seen on CT containing a small amount of fat, no abdominal scars noted   Lab Results:   Recent Labs  02/14/14 0324 02/15/14 0400  WBC 10.8* 9.2  HGB 10.6* 9.6*  HCT 32.3* 29.7*  PLT 224 205   BMET  Recent Labs  02/14/14 0324 02/15/14 0400  NA 135* 140  K 5.8* 4.9  CL 89* 95*  CO2 23 27  GLUCOSE 214* 130*  BUN 58* 35*  CREATININE 9.63* 6.63*  CALCIUM 10.0 10.2   PT/INR  Recent Labs  02/14/14 0900 02/15/14 0400  LABPROT 21.2* 16.1*  INR 1.90* 1.32   CMP     Component Value Date/Time   NA 140 02/15/2014 0400   K 4.9 02/15/2014 0400   K 4.4 05/04/2011 1426   CL 95* 02/15/2014 0400   CL 101 05/04/2011 1427   CO2 27 02/15/2014 0400   CO2 25 05/04/2011 1427   GLUCOSE 130* 02/15/2014 0400   BUN 35* 02/15/2014 0400   CREATININE 6.63* 02/15/2014 0400   CALCIUM 10.2 02/15/2014 0400   CALCIUM 8.8 05/04/2011 1426   PROT 8.2 02/13/2014 2001   PROT 6.4  05/04/2011 1427   ALBUMIN 2.7* 02/13/2014 2001   AST 28 02/13/2014 2001   AST 22 05/04/2011 1427   ALT 19 02/13/2014 2001   ALKPHOS 95 02/13/2014 2001   BILITOT 2.5* 02/13/2014 2001   GFRNONAA 8* 02/15/2014 0400   GFRAA 9* 02/15/2014 0400   Lipase     Component Value Date/Time   LIPASE 39 02/13/2014 2022       Studies/Results: Dg Chest 2 View  02/13/2014   CLINICAL DATA:  Shortness of breath and weakness.  EXAM: CHEST  2 VIEW  COMPARISON:  CT of the abdomen and pelvis performed earlier today at 9:41 p.m., and chest radiograph from 09/17/2013  FINDINGS: Trace right-sided pneumothorax is seen. A small to moderate right-sided pleural effusion is noted, with associated airspace opacification. This may reflect atelectasis or pneumonia. Mild left basilar opacity may also reflect pneumonia, given the appearance on CT.  The heart is borderline enlarged. The patient is status post median sternotomy, with evidence of prior CABG. No acute osseous abnormalities are seen.  IMPRESSION: 1. Trace right-sided pneumothorax seen, as noted on CT. 2. Small-to-moderate right-sided pleural effusion, with associated airspace opacification; this may reflect atelectasis or pneumonia. Mild left basilar pneumonia seen. 3. Borderline cardiomegaly.   Electronically Signed   By: Francoise Schaumann.D.  On: 02/13/2014 23:22   Ct Abdomen Pelvis W Contrast  02/13/2014   CLINICAL DATA:  Abdominal pain, end-stage renal disease  EXAM: CT ABDOMEN AND PELVIS WITH CONTRAST  TECHNIQUE: Multidetector CT imaging of the abdomen and pelvis was performed using the standard protocol following bolus administration of intravenous contrast.  CONTRAST:  100 mL Isovue  COMPARISON:  CT chest 12/06/2011  FINDINGS: There is a chronic right pleural effusion with associated dense right basilar atelectasis within air bronchograms. Cannot exclude right lower lobe pneumonia. There is a small pneumothorax anterior to the right middle lobe. No pericardial fluid. Mild  airspace disease in the lower left lower lobe could represent pneumonia or aspiration pneumonitis.  No focal hepatic lesion. The gallbladder, pancreas, spleen, adrenal glands, are normal. The kidneys show minimal enhancement. Cyst of the left kidney.  The stomach contains a moderate amount of oral contrast. The duodenum and jejunum are dilated with poor progression of the oral contrast. Proximal small bowel loops measure 4 cm. The most distal small bowel is near completely collapsed in the pelvis (images 78, series 2). At discrete transition point is not identified. There is gas within the wall of the cecum tip as seen on coronal image 51, series 4. The appendix appears normal. The ascending colon is collapsed. The transverse and descending colon are collapsed.  Abdominal aorta is heavily calcified. No retroperitoneal periportal lymphadenopathy.  No free fluid the pelvis. The prostate gland and bladder normal. No pelvic lymphadenopathy. There is bilateral inguinal hernias. No aggressive osseous lesion. There is chronic appearing compression fracture of the L1 vertebral body.  IMPRESSION: 1. Chronic right pleural effusion with associated right lower lobe atelectasis. Cannot exclude right lower lobe pneumonia. 2. Small pneumothorax is noted anterior to the right middle lobe. Recommend chest radiograph to exclude a larger pneumothorax. 3. Pneumonitis versus pneumonia at the left lung base. Consider aspiration. 4. Small bowel obstruction pattern with change in caliber from the proximal small bowel to the distal small bowel with a poor progression oral contrast. No transition point identified. 5. Pneumatosis of the tip of the cecum. This could be a benign pneumatosis but cannot exclude ischemia. 6. Appendix is normal. Findings conveyed toCHRISTOPHER POLLINA on 02/13/2014  at22:02.   Electronically Signed   By: Suzy Bouchard M.D.   On: 02/13/2014 22:02   Dg Chest Port 1 View  02/14/2014   CLINICAL DATA:  Central line  and nasogastric tube placement.  EXAM: PORTABLE CHEST - 1 VIEW  COMPARISON:  Chest radiograph from 02/13/2014  FINDINGS: The patient's left IJ line is seen ending about the mid to distal SVC. An enteric tube is noted extending below the diaphragm.  There is a mildly worsened small to moderate right-sided pleural effusion. The previously noted trace right pneumothorax is suggested but not well seen. Worsening bibasilar airspace opacification may reflect worsening pneumonia. Asymmetric interstitial edema might have a similar appearance. Underlying vascular congestion is seen.  The cardiomediastinal silhouette is enlarged. The patient status post median sternotomy, with evidence of prior CABG. No acute osseous abnormalities are seen.  IMPRESSION: 1. Left IJ line seen ending about the mid to distal SVC. 2. Enteric tube noted extending below the diaphragm. 3. Mildly worsened small to moderate right-sided pleural effusion. Previously noted trace right pneumothorax is suggested but not well seen. 4. Worsening bibasilar airspace opacification may reflect worsening pneumonia. Asymmetric interstitial edema might have a similar appearance. 5. Underlying vascular congestion and cardiomegaly noted.   Electronically Signed   By: Jacqulynn Cadet  Chang M.D.   On: 02/14/2014 03:04    Anti-infectives: Anti-infectives   Start     Dose/Rate Route Frequency Ordered Stop   02/14/14 1800  vancomycin (VANCOCIN) IVPB 1000 mg/200 mL premix     1,000 mg 200 mL/hr over 60 Minutes Intravenous  Once 02/14/14 1205 02/14/14 2232   02/14/14 0230  piperacillin-tazobactam (ZOSYN) IVPB 2.25 g     2.25 g 100 mL/hr over 30 Minutes Intravenous Every 8 hours 02/14/14 0219     02/13/14 2330  vancomycin (VANCOCIN) 1,500 mg in sodium chloride 0.9 % 500 mL IVPB     1,500 mg 250 mL/hr over 120 Minutes Intravenous  Once 02/13/14 2251 02/14/14 0124   02/13/14 2300  piperacillin-tazobactam (ZOSYN) IVPB 3.375 g  Status:  Discontinued     3.375 g 100  mL/hr over 30 Minutes Intravenous  Once 02/13/14 2250 02/14/14 0219   02/13/14 2259  vancomycin (VANCOCIN) 1 GM/200ML IVPB  Status:  Discontinued    Comments:  Eulis Manly   : cabinet override      02/13/14 2259 02/13/14 2304   02/13/14 2245  levofloxacin (LEVAQUIN) IVPB 750 mg     750 mg 100 mL/hr over 90 Minutes Intravenous  Once 02/13/14 2238 02/14/14 0022       Assessment/Plan SBO without obvious cause - no h/o hernias, abdominal surgery, infections, or trauma  Cecal pneumatosis - may be incidental  Mildly elevated lactic acid- improved to 1.2 AFIB Chronic anticoagulation with INR 8.82 upon admission - now 1.32 Cardiomyopathy with LVEF 15-20%  ESRD on HD Tu/Th/Sa   Plan:  1. Conservative management, no changes to treatment plan today - NPO, NG tube LIWS (1,135mL/24hr - too early to start clamping trials), IVF, pain control, antiemetics  2. Serial exams/xrays/labs, KUB this am still with lots of distended SB loops 3. Quite unusual given no h/o abdominal trauma, infection, hernias, or surgeries that he would have a bowel obstruction. Unsure of etiology at this time. No urgent needs for surgery at this point. Poor surgical candidate. Hopefully will resolve conservatively without surgical intervention.  4. SCD's, hold coumadin, lovenox or heparin okay for DVT prophylaxis for now  5. Will follow. Will need colonoscopy in 6 weeks from now once this resolves for further evaluation and to r/o cancer.     LOS: 2 days    Coralie Keens 02/15/2014, 9:41 AM Pager: [phone]

## 2014-02-15 NOTE — Progress Notes (Signed)
Passed some more flatus. Abdomen less distended and NT. Hope to try clamping NGT tomorrow. Patient examined and I agree with the assessment and plan  Georganna Skeans, MD, MPH, FACS Trauma: [phone] General Surgery: [phone]  02/15/2014 11:38 AM

## 2014-02-15 NOTE — Progress Notes (Signed)
PULMONARY / CRITICAL CARE MEDICINE   Name: [name] [name] MRN: [id number] DOB: 11-Aug-1951    ADMISSION DATE:  02/13/2014  PRIMARY SERVICE: PCCM  CHIEF COMPLAINT:  SBO  BRIEF PATIENT DESCRIPTION:  63 years old with complex PMH including ESRD on HD T/Th/Sat, A. Fib on coumadin, HTN, cardiomyopathy with most recent LVEF of 15 to 20%, DM2, UGI bleed. Present with nausea, vomiting and abdominal dystention. CT of the abdomen shows SBO and small area of cecal pneumatosis. Transferred from [facility].   SIGNIFICANT EVENTS / STUDIES:  - CT abdomen: SBO with small area of pneumatosis at the tip of the cecum. Chronic right pleural effusion. Small pneumothorax.   LINES / TUBES: - Peripheral IV's - Left IJ CVC 6/20>>>  CULTURES: - Blood cultures ordered 6/20>>> - Sputum culture ordered 6/20>>>  ANTIBIOTICS: - Zosyn 6/20>>> - Vancomycin 6/20>>>  SUBJECTIVE: Reports pain improved but present, sporadic episodes of hypotension overnight.  VITAL SIGNS: Temp:  [97.3 F (36.3 C)-98.6 F (37 C)] 98.1 F (36.7 C) (06/21 0712) Pulse Rate:  [33-139] 112 (06/21 0200) Resp:  [17-30] 21 (06/21 0700) BP: (82-113)/(43-85) 102/54 mmHg (06/21 0700) SpO2:  [91 %-100 %] 94 % (06/21 0700) Weight:  [190 lb 4.1 oz (86.3 kg)-193 lb 9 oz (87.8 kg)] 190 lb 4.1 oz (86.3 kg) (06/21 0400) HEMODYNAMICS:   VENTILATOR SETTINGS:   INTAKE / OUTPUT: Intake/Output     06/20 0701 - 06/21 0700 06/21 0701 - 06/22 0700   P.O. 150    I.V. (mL/kg) 298.3 (3.5)    NG/GT 60    IV Piggyback 300    Total Intake(mL/kg) 808.3 (9.4)    Emesis/NG output 1100    Other -503    Total Output 597     Net +211.3            PHYSICAL EXAMINATION: General: Pleasant male patient in no acute distress.  Chronically ill appearing. Eyes: Anicteric sclerae. ENT: Oropharynx clear. Dry mucous membranes. No thrush Heart: Irregular rhythm. No murmurs, rubs, or gallops appreciated. No bruits, equal pulses in upper extremities. Lungs:  Diminished BS diffusely L>R. Abdomen: Abdomen soft, mildly tender, moderately distended and tympanic on percussion. Normoactive bowel sounds. No hepatosplenomegaly or masses.  Pain to palpation concentrated at the RLQ. Musculoskeletal: No clubbing or synovitis. Right AKA, no edema on left leg.  Skin: No rashes or lesions Neuro: No focal neurologic deficits.  LABS:  CBC  Recent Labs Lab 02/13/14 2001 02/14/14 0324 02/15/14 0400  WBC 9.7 10.8* 9.2  HGB 13.1 10.6* 9.6*  HCT 40.3 32.3* 29.7*  PLT 239 224 205   Coag's  Recent Labs Lab 02/14/14 0431 02/14/14 0900 02/15/14 0400  INR 8.82* 1.90* 1.32   BMET  Recent Labs Lab 02/13/14 2001 02/14/14 0324 02/15/14 0400  NA 140 135* 140  K 5.7* 5.8* 4.9  CL 91* 89* 95*  CO2 24 23 27   BUN 54* 58* 35*  CREATININE 9.42* 9.63* 6.63*  GLUCOSE 199* 214* 130*   Electrolytes  Recent Labs Lab 02/13/14 2001 02/14/14 0324 02/15/14 0400  CALCIUM 10.8* 10.0 10.2  MG  --  1.8 2.0  PHOS  --  4.9* 5.2*   Sepsis Markers  Recent Labs Lab 02/13/14 2306 02/14/14 0324 02/15/14 0400  LATICACIDVEN 4.6* 2.8* 1.2   ABG  Recent Labs Lab 02/13/14 2235  PHART 7.406  PCO2ART 36.5  PO2ART 65.4*   Liver Enzymes  Recent Labs Lab 02/13/14 2001  AST 28  ALT 19  ALKPHOS 95  BILITOT 2.5*  ALBUMIN 2.7*   Cardiac Enzymes  Recent Labs Lab 02/13/14 2022  TROPONINI <0.30   Glucose  Recent Labs Lab 02/14/14 1214 02/14/14 1520 02/14/14 2120 02/14/14 2357 02/15/14 0346 02/15/14 0714  GLUCAP 149* 125* 112* 137* 124* 124*    Imaging Dg Chest 2 View  02/13/2014   CLINICAL DATA:  Shortness of breath and weakness.  EXAM: CHEST  2 VIEW  COMPARISON:  CT of the abdomen and pelvis performed earlier today at 9:41 p.m., and chest radiograph from 09/17/2013  FINDINGS: Trace right-sided pneumothorax is seen. A small to moderate right-sided pleural effusion is noted, with associated airspace opacification. This may reflect  atelectasis or pneumonia. Mild left basilar opacity may also reflect pneumonia, given the appearance on CT.  The heart is borderline enlarged. The patient is status post median sternotomy, with evidence of prior CABG. No acute osseous abnormalities are seen.  IMPRESSION: 1. Trace right-sided pneumothorax seen, as noted on CT. 2. Small-to-moderate right-sided pleural effusion, with associated airspace opacification; this may reflect atelectasis or pneumonia. Mild left basilar pneumonia seen. 3. Borderline cardiomegaly.   Electronically Signed   By: Garald Balding M.D.   On: 02/13/2014 23:22   Ct Abdomen Pelvis W Contrast  02/13/2014   CLINICAL DATA:  Abdominal pain, end-stage renal disease  EXAM: CT ABDOMEN AND PELVIS WITH CONTRAST  TECHNIQUE: Multidetector CT imaging of the abdomen and pelvis was performed using the standard protocol following bolus administration of intravenous contrast.  CONTRAST:  100 mL Isovue  COMPARISON:  CT chest 12/06/2011  FINDINGS: There is a chronic right pleural effusion with associated dense right basilar atelectasis within air bronchograms. Cannot exclude right lower lobe pneumonia. There is a small pneumothorax anterior to the right middle lobe. No pericardial fluid. Mild airspace disease in the lower left lower lobe could represent pneumonia or aspiration pneumonitis.  No focal hepatic lesion. The gallbladder, pancreas, spleen, adrenal glands, are normal. The kidneys show minimal enhancement. Cyst of the left kidney.  The stomach contains a moderate amount of oral contrast. The duodenum and jejunum are dilated with poor progression of the oral contrast. Proximal small bowel loops measure 4 cm. The most distal small bowel is near completely collapsed in the pelvis (images 78, series 2). At discrete transition point is not identified. There is gas within the wall of the cecum tip as seen on coronal image 51, series 4. The appendix appears normal. The ascending colon is collapsed.  The transverse and descending colon are collapsed.  Abdominal aorta is heavily calcified. No retroperitoneal periportal lymphadenopathy.  No free fluid the pelvis. The prostate gland and bladder normal. No pelvic lymphadenopathy. There is bilateral inguinal hernias. No aggressive osseous lesion. There is chronic appearing compression fracture of the L1 vertebral body.  IMPRESSION: 1. Chronic right pleural effusion with associated right lower lobe atelectasis. Cannot exclude right lower lobe pneumonia. 2. Small pneumothorax is noted anterior to the right middle lobe. Recommend chest radiograph to exclude a larger pneumothorax. 3. Pneumonitis versus pneumonia at the left lung base. Consider aspiration. 4. Small bowel obstruction pattern with change in caliber from the proximal small bowel to the distal small bowel with a poor progression oral contrast. No transition point identified. 5. Pneumatosis of the tip of the cecum. This could be a benign pneumatosis but cannot exclude ischemia. 6. Appendix is normal. Findings conveyed toCHRISTOPHER POLLINA on 02/13/2014  at22:02.   Electronically Signed   By: Suzy Bouchard M.D.   On: 02/13/2014 22:02  Dg Chest Port 1 View  02/14/2014   CLINICAL DATA:  Central line and nasogastric tube placement.  EXAM: PORTABLE CHEST - 1 VIEW  COMPARISON:  Chest radiograph from 02/13/2014  FINDINGS: The patient's left IJ line is seen ending about the mid to distal SVC. An enteric tube is noted extending below the diaphragm.  There is a mildly worsened small to moderate right-sided pleural effusion. The previously noted trace right pneumothorax is suggested but not well seen. Worsening bibasilar airspace opacification may reflect worsening pneumonia. Asymmetric interstitial edema might have a similar appearance. Underlying vascular congestion is seen.  The cardiomediastinal silhouette is enlarged. The patient status post median sternotomy, with evidence of prior CABG. No acute osseous  abnormalities are seen.  IMPRESSION: 1. Left IJ line seen ending about the mid to distal SVC. 2. Enteric tube noted extending below the diaphragm. 3. Mildly worsened small to moderate right-sided pleural effusion. Previously noted trace right pneumothorax is suggested but not well seen. 4. Worsening bibasilar airspace opacification may reflect worsening pneumonia. Asymmetric interstitial edema might have a similar appearance. 5. Underlying vascular congestion and cardiomegaly noted.   Electronically Signed   By: Garald Balding M.D.   On: 02/14/2014 03:04     CXR:  IMPRESSION:  1. Trace right-sided pneumothorax seen, as noted on CT.  2. Small-to-moderate right-sided pleural effusion, with associated  airspace opacification; this may reflect atelectasis or pneumonia.  Mild left basilar pneumonia seen.  3. Borderline cardiomegaly.   ASSESSMENT / PLAN:  PULMONARY A: 1) Right chronic pleural effusion, unclear etiology, may be complex pleural effusion given presence of pneumothorax seen on CT and chest X ray. Will eventually need thoracentesis. Now has supratherapeutic INR. P:   - Continue supplemental O2 to keep O2 sat > 94% - Will eventually need diagnostic thoracentesis, likely in AM but hold off for now.  - No PTX seen on this AM CXR, I suspect could have been a bleb or artifactual in nature.  CARDIOVASCULAR A:  1) Mild hypotension. In outpatient HD evidently runs SBP in the 90's.  2) Lactic acidosis likely from SBO, volume depletion.  3) Cardiomyopathy with LVEF 15 to 20% with severe diastolic dysfunction. Stable. 4) Atrial fibrillation on coumadin, rate in the low 100's P:  - ICU monitoring given BP fluctuation overnight. - KVO IVF and will rely more on pressors at this point if BP drops and is accurate. - Trend lactate - May need pressors.  - Will continue to hold home oral antihypertensive and antiarrythmic medications including metoprolol, diltiazem, amiodarone - Coumadin held  for ?thora in AM, if no thora then will consider placing on heparin instead specially that Hg stable between 6/20 and 6/21.  RENAL A:   1) ESRD on HD T / Th / Sat 2)  P:   - Nephrology consult called. - Will follow BMP - Replenish lytes as necessary.   GASTROINTESTINAL A:   1) History of UGI bleed 2) SBO, Surgery at bedside. Abdomen is distended but soft and mildly tender. Surgery recommends conservative management for now. P:   - NPO - NGT placement for decompression and bowel rest - If continues to be NPO in AM then will need to consider TPN - Will follow lactate - Antibiotics  HEMATOLOGIC A:   1) Supratherapeutic INR. No evidence of bleeding. No need for aggressive reversal.  P:  - Will follow INR in am - Hold further vitamin K. - Coumadin held for ?thora in AM but if not and Hg  remains stable in AM will likely start heparin (incase needs to go to the OR and since bowel rest).  INFECTIOUS A:   1) SBO, cecal pneumatosis 2) Bilateral lower lobes infiltrates, possible pneumonia / Aspiration P:   - Zosyn - Vancomycin  - Will follow blood and sputum cultures  ENDOCRINE A:   1) DM2   P:   - Novolog sliding scale per ICU hyperglycemia protocol  NEUROLOGIC A:   1) No issues P:    TODAY'S SUMMARY: Appreciate input from surgery.  Continue bowel rest, will hold in the ICU overnight for sporadic episodes of hypotension.  If stable overnight then will likely move out to SDU and to [facility] in AM.  I have personally obtained a history, examined the patient, evaluated laboratory and imaging results, formulated the assessment and plan and placed orders.  CRITICAL CARE: The patient is critically ill with multiple organ systems failure and requires high complexity decision making for assessment and support, frequent evaluation and titration of therapies, application of advanced monitoring technologies and extensive interpretation of multiple databases. Critical Care Time devoted to  patient care services described in this note is 35 minutes.   Rush Farmer, M.D. [facility] Pulmonary/Critical Care Medicine. Pager: [phone]. After hours pager: [phone].

## 2014-02-15 NOTE — Progress Notes (Signed)
  [facility] Progress Note   Subjective: No complaints, passing some flatus  Filed Vitals:   02/15/14 0900 02/15/14 1000 02/15/14 1100 02/15/14 1143  BP: 98/60 94/64 92/55    Pulse: 111     Temp:    98.6 F (37 C)  TempSrc:    Oral  Resp: 16 17 22    Height:      Weight:      SpO2: 96% 92%     Exam: Calm, no distress No jvd  Chest clear bilat  Cor tachy, irregular, no MRG  Abd soft, NTND, +BS R BKA, no LE edema Neuro is nf, Ox 3  R forearm AVF +bruit   HD: [facility]  3.5h   87kg    2K/2.5 [place]    Heparin none  Hectorol 1 ug TIW  BP usually runs in the 90's w/o symptoms per RN   Assessment:  1 SBO- conservative Rx per surgery for now, NG drainage, on IV zosyn 2 Chronic R pleural effusion / bibasilar infiltrates- per CCM  3 ESRD on HD 4 Afib - meds on hold, HR 110 5 CM EF 15%  6 DM 2 on insulin  7 HTPH cont vit D, po meds on hold 8 Anemia no esa for now 9 R BKA  10 Mild hyperkalemia - resolved 11 Hypotension- chronic issue, BP in 90's at OP HD 12 Volume - no vol excess on exam  Plan - HD tuesday   Kelly Splinter MD  pager [phone]    cell [phone]  02/15/2014, 12:29 PM     Recent Labs Lab 02/13/14 2001 02/14/14 0324 02/15/14 0400  NA 140 135* 140  K 5.7* 5.8* 4.9  CL 91* 89* 95*  CO2 24 23 27   GLUCOSE 199* 214* 130*  BUN 54* 58* 35*  CREATININE 9.42* 9.63* 6.63*  CALCIUM 10.8* 10.0 10.2  PHOS  --  4.9* 5.2*    Recent Labs Lab 02/13/14 2001  AST 28  ALT 19  ALKPHOS 95  BILITOT 2.5*  PROT 8.2  ALBUMIN 2.7*    Recent Labs Lab 02/13/14 2001 02/14/14 0324 02/15/14 0400  WBC 9.7 10.8* 9.2  NEUTROABS 7.3  --   --   HGB 13.1 10.6* 9.6*  HCT 40.3 32.3* 29.7*  MCV 92.6 90.2 90.8  PLT 239 224 205   . Chlorhexidine Gluconate Cloth  6 each Topical Q0600  . insulin aspart  2-6 Units Subcutaneous 6 times per day  . mupirocin ointment  1 application Nasal BID  . pantoprazole (PROTONIX) IV  40 mg Intravenous QHS  .  piperacillin-tazobactam (ZOSYN)  IV  2.25 g Intravenous Q8H  . sodium chloride  10-40 mL Intracatheter Q12H  . sodium chloride  3 mL Intravenous Q12H     sodium chloride, sodium chloride, sodium chloride, albuterol, feeding supplement (NEPRO CARB STEADY), heparin, lidocaine (PF), lidocaine-prilocaine, pentafluoroprop-tetrafluoroeth, sodium chloride, sodium chloride

## 2014-02-16 ENCOUNTER — Inpatient Hospital Stay (HOSPITAL_COMMUNITY): Payer: Medicare Other

## 2014-02-16 ENCOUNTER — Ambulatory Visit (HOSPITAL_COMMUNITY): Admission: RE | Admit: 2014-02-16 | Payer: Medicare Other | Source: Ambulatory Visit

## 2014-02-16 DIAGNOSIS — K56609 Unspecified intestinal obstruction, unspecified as to partial versus complete obstruction: Principal | ICD-10-CM

## 2014-02-16 DIAGNOSIS — I4891 Unspecified atrial fibrillation: Secondary | ICD-10-CM

## 2014-02-16 DIAGNOSIS — I429 Cardiomyopathy, unspecified: Secondary | ICD-10-CM

## 2014-02-16 DIAGNOSIS — Z992 Dependence on renal dialysis: Secondary | ICD-10-CM

## 2014-02-16 LAB — PROTIME-INR
INR: 1.53 — ABNORMAL HIGH (ref 0.00–1.49)
Prothrombin Time: 18 seconds — ABNORMAL HIGH (ref 11.6–15.2)

## 2014-02-16 LAB — CBC
HEMATOCRIT: 29.8 % — AB (ref 39.0–52.0)
Hemoglobin: 9.8 g/dL — ABNORMAL LOW (ref 13.0–17.0)
MCH: 30 pg (ref 26.0–34.0)
MCHC: 32.9 g/dL (ref 30.0–36.0)
MCV: 91.1 fL (ref 78.0–100.0)
Platelets: 220 10*3/uL (ref 150–400)
RBC: 3.27 MIL/uL — AB (ref 4.22–5.81)
RDW: 16.1 % — ABNORMAL HIGH (ref 11.5–15.5)
WBC: 11.2 10*3/uL — AB (ref 4.0–10.5)

## 2014-02-16 LAB — GLUCOSE, CAPILLARY
GLUCOSE-CAPILLARY: 88 mg/dL (ref 70–99)
Glucose-Capillary: 103 mg/dL — ABNORMAL HIGH (ref 70–99)
Glucose-Capillary: 116 mg/dL — ABNORMAL HIGH (ref 70–99)
Glucose-Capillary: 106 mg/dL — ABNORMAL HIGH (ref 70–99)

## 2014-02-16 LAB — IRON AND TIBC
Iron: 43 ug/dL (ref 42–135)
Saturation Ratios: 28 % (ref 20–55)
TIBC: 152 ug/dL — ABNORMAL LOW (ref 215–435)
UIBC: 109 ug/dL — ABNORMAL LOW (ref 125–400)

## 2014-02-16 LAB — BASIC METABOLIC PANEL
BUN: 58 mg/dL — ABNORMAL HIGH (ref 6–23)
CHLORIDE: 93 meq/L — AB (ref 96–112)
CO2: 25 meq/L (ref 19–32)
CREATININE: 8.96 mg/dL — AB (ref 0.50–1.35)
Calcium: 10.2 mg/dL (ref 8.4–10.5)
GFR calc Af Amer: 6 mL/min — ABNORMAL LOW (ref >90)
GFR calc non Af Amer: 5 mL/min — ABNORMAL LOW (ref >90)
GLUCOSE: 121 mg/dL — AB (ref 70–99)
Potassium: 5.3 mEq/L (ref 3.7–5.3)
Sodium: 139 mEq/L (ref 137–147)

## 2014-02-16 LAB — PHOSPHORUS: Phosphorus: 6.4 mg/dL — ABNORMAL HIGH (ref 2.3–4.6)

## 2014-02-16 LAB — MAGNESIUM: Magnesium: 2.3 mg/dL (ref 1.5–2.5)

## 2014-02-16 MED ORDER — HEPARIN SODIUM (PORCINE) 5000 UNIT/ML IJ SOLN
5000.0000 [IU] | Freq: Three times a day (TID) | INTRAMUSCULAR | Status: DC
  Administered 2014-02-16 – 2014-02-19 (×8): 5000 [IU] via SUBCUTANEOUS
  Filled 2014-02-16 (×12): qty 1

## 2014-02-16 MED ORDER — INSULIN ASPART 100 UNIT/ML ~~LOC~~ SOLN
0.0000 [IU] | SUBCUTANEOUS | Status: DC
  Administered 2014-02-19: 2 [IU] via SUBCUTANEOUS
  Administered 2014-02-19 – 2014-02-20 (×3): 3 [IU] via SUBCUTANEOUS
  Administered 2014-02-20 (×2): 2 [IU] via SUBCUTANEOUS
  Administered 2014-02-20: 3 [IU] via SUBCUTANEOUS
  Administered 2014-02-21: 2 [IU] via SUBCUTANEOUS

## 2014-02-16 MED ORDER — VANCOMYCIN HCL IN DEXTROSE 1-5 GM/200ML-% IV SOLN
1000.0000 mg | INTRAVENOUS | Status: DC
  Administered 2014-02-17: 1000 mg via INTRAVENOUS
  Filled 2014-02-16: qty 200

## 2014-02-16 NOTE — Plan of Care (Signed)
Problem: Phase I Progression Outcomes Goal: OOB as tolerated unless otherwise ordered Outcome: Progressing Have been up to [facility] and chair today.

## 2014-02-16 NOTE — Progress Notes (Signed)
[facility] Progress Note     Subjective: Pt doing well.  Minimal abdominal pain, no NV.  Wants NG out.  Had a regular sized soft BM with maybe a little blood in it this am.  Having flatus.  Up to the chair.  Hungry.  Objective: Vital signs in last 24 hours: Temp:  [98.1 F (36.7 C)-98.6 F (37 C)] 98.3 F (36.8 C) (06/22 0356) Pulse Rate:  [101-111] 106 (06/22 0300) Resp:  [16-29] 26 (06/22 0700) BP: (91-132)/(50-77) 106/72 mmHg (06/22 0700) SpO2:  [92 %-100 %] 99 % (06/22 0700) Weight:  [191 lb 9.3 oz (86.9 kg)] 191 lb 9.3 oz (86.9 kg) (06/22 0403) Last BM Date: 02/15/14  Intake/Output from previous day: 06/21 0701 - 06/22 0700 In: 1017 [P.O.:550; I.V.:255; NG/GT:60; IV Piggyback:150] Out: 1200 [Emesis/NG output:1200] Intake/Output this shift:    PE: Gen: Alert, NAD, pleasant, up to chair Abd: Soft, less distended, minimal tenderness in the lower abdomen, few BS heard, no HSM, small umbilical hernia palpated and seen on CT containing a small amount of fat, no abdominal scars noted   Lab Results:   Recent Labs  02/15/14 0400 02/16/14 0400  WBC 9.2 11.2*  HGB 9.6* 9.8*  HCT 29.7* 29.8*  PLT 205 220   BMET  Recent Labs  02/15/14 0400 02/16/14 0400  NA 140 139  K 4.9 5.3  CL 95* 93*  CO2 27 25  GLUCOSE 130* 121*  BUN 35* 58*  CREATININE 6.63* 8.96*  CALCIUM 10.2 10.2   PT/INR  Recent Labs  02/15/14 0400 02/16/14 0400  LABPROT 16.1* 18.0*  INR 1.32 1.53*   CMP     Component Value Date/Time   NA 139 02/16/2014 0400   K 5.3 02/16/2014 0400   K 4.4 05/04/2011 1426   CL 93* 02/16/2014 0400   CL 101 05/04/2011 1427   CO2 25 02/16/2014 0400   CO2 25 05/04/2011 1427   GLUCOSE 121* 02/16/2014 0400   BUN 58* 02/16/2014 0400   CREATININE 8.96* 02/16/2014 0400   CALCIUM 10.2 02/16/2014 0400   CALCIUM 8.8 05/04/2011 1426   PROT 8.2 02/13/2014 2001   PROT 6.4 05/04/2011 1427   ALBUMIN 2.7* 02/13/2014 2001   AST 28 02/13/2014 2001   AST 22 05/04/2011 1427   ALT 19 02/13/2014 2001   ALKPHOS 95 02/13/2014 2001   BILITOT 2.5* 02/13/2014 2001   GFRNONAA 5* 02/16/2014 0400   GFRAA 6* 02/16/2014 0400   Lipase     Component Value Date/Time   LIPASE 39 02/13/2014 2022       Studies/Results: Dg Chest Port 1 View  02/15/2014   CLINICAL DATA:  Rule out pneumothorax  EXAM: PORTABLE CHEST - 1 VIEW  COMPARISON:  02/14/2014  FINDINGS: Left jugular central venous catheter tip in the SVC is unchanged. NG tube in the stomach.  Bibasilar airspace disease right greater than left with right pleural effusion is unchanged. No pneumothorax.  IMPRESSION: Bibasilar atelectasis/ infiltrate and right pleural effusion unchanged. No pneumothorax.   Electronically Signed   By: Franchot Gallo M.D.   On: 02/15/2014 10:31   Dg Abd Portable 1v  02/15/2014   CLINICAL DATA:  Small-bowel obstruction  EXAM: PORTABLE ABDOMEN - 1 VIEW  COMPARISON:  CT 02/13/2014  FINDINGS: Multiple dilated small bowel loops consistent with small bowel obstruction as noted on the CT. Upright view was not obtained to evaluate for air-fluid level or free air. No colonic gas is present.  IMPRESSION: High-grade small-bowel obstruction is  unchanged.   Electronically Signed   By: Franchot Gallo M.D.   On: 02/15/2014 10:59    Anti-infectives: Anti-infectives   Start     Dose/Rate Route Frequency Ordered Stop   02/14/14 1800  vancomycin (VANCOCIN) IVPB 1000 mg/200 mL premix     1,000 mg 200 mL/hr over 60 Minutes Intravenous  Once 02/14/14 1205 02/14/14 2232   02/14/14 0230  piperacillin-tazobactam (ZOSYN) IVPB 2.25 g     2.25 g 100 mL/hr over 30 Minutes Intravenous Every 8 hours 02/14/14 0219     02/13/14 2330  vancomycin (VANCOCIN) 1,500 mg in sodium chloride 0.9 % 500 mL IVPB     1,500 mg 250 mL/hr over 120 Minutes Intravenous  Once 02/13/14 2251 02/14/14 0124   02/13/14 2300  piperacillin-tazobactam (ZOSYN) IVPB 3.375 g  Status:  Discontinued     3.375 g 100 mL/hr over 30 Minutes Intravenous  Once  02/13/14 2250 02/14/14 0219   02/13/14 2259  vancomycin (VANCOCIN) 1 GM/200ML IVPB  Status:  Discontinued    Comments:  Eulis Manly   : cabinet override      02/13/14 2259 02/13/14 2304   02/13/14 2245  levofloxacin (LEVAQUIN) IVPB 750 mg     750 mg 100 mL/hr over 90 Minutes Intravenous  Once 02/13/14 2238 02/14/14 0022       Assessment/Plan SBO without obvious cause - no h/o hernias, abdominal surgery, infections, or trauma  Cecal pneumatosis  Mildly elevated lactic acid- improved to 1.2  AFIB Chronic anticoagulation with INR 8.82 upon admission - now 1.32  Cardiomyopathy with LVEF 15-20%  ESRD on HD Tu/Th/Sa - Cr 8.96 Leukocytosis - 11.2 Anemia of CD - Hgb 9.8  Plan:  1. Conservative management, no changes to treatment plan today - NPO, NG tube LIWS (1,274mL/24hr - but all clear yellow output, may be able to start clamping trials, but I will recheck KUB since showed high grade SBO yesterday), IVF, pain control, antiemetics  2. Serial exams/xrays/labs 3. Quite unusual given no h/o abdominal trauma, infection, hernias, or surgeries that he would have a bowel obstruction. Unsure of etiology at this time.  Poor surgical candidate, but may need laparoscopy or laparotomy to find out what's causing his obstruction if he doesn't resolve.  Hopefully will resolve conservatively without surgical intervention.  4. SCD's, hold coumadin, lovenox or heparin okay for DVT prophylaxis for now  5. Will follow. May need UGI with SBFT.  Will need colonoscopy in 6 weeks from now once this resolves for further evaluation and to r/o cancer.     LOS: 3 days    Coralie Keens 02/16/2014, 7:20 AM Pager: [phone]

## 2014-02-16 NOTE — Progress Notes (Signed)
No flatus, abdomen not really distended or tender though, xray still looks obstructive. No etiology of obstruction. I think he will likely need surgery soon if this does not resolve quickly

## 2014-02-16 NOTE — Progress Notes (Signed)
[facility] ROUNDING NOTE   Subjective:   Interval History: passing flatus   Objective:  Vital signs in last 24 hours:  Temp:  [97.8 F (36.6 C)-98.6 F (37 C)] 97.8 F (36.6 C) (06/22 0844) Pulse Rate:  [101-110] 106 (06/22 0300) Resp:  [17-29] 23 (06/22 0800) BP: (91-132)/(50-77) 114/73 mmHg (06/22 0900) SpO2:  [95 %-100 %] 100 % (06/22 0800) Weight:  [86.9 kg (191 lb 9.3 oz)] 86.9 kg (191 lb 9.3 oz) (06/22 0403)  Weight change: -0.9 kg (-1 lb 15.7 oz) Filed Weights   02/14/14 2026 02/15/14 0400 02/16/14 0403  Weight: 87 kg (191 lb 12.8 oz) 86.3 kg (190 lb 4.1 oz) 86.9 kg (191 lb 9.3 oz)    Intake/Output: I/O last 3 completed shifts: In: 1533.3 [P.O.:550; I.V.:463.3; NG/GT:120; IV Piggyback:400] Out: 997 [Emesis/NG output:1500]   Intake/Output this shift:  Total I/O In: 50 [I.V.:20; NG/GT:30] Out: 100 [Emesis/NG output:100]  Chest clear bilat  Cor tachy, irregular, no MRG  Abd soft, NTND, +BS  R BKA, no LE edema  Neuro is nf, Ox 3  R forearm AVF +bruit    Basic Metabolic Panel:  Recent Labs Lab 02/13/14 2001 02/14/14 0324 02/15/14 0400 02/16/14 0400  NA 140 135* 140 139  K 5.7* 5.8* 4.9 5.3  CL 91* 89* 95* 93*  CO2 24 23 27 25   GLUCOSE 199* 214* 130* 121*  BUN 54* 58* 35* 58*  CREATININE 9.42* 9.63* 6.63* 8.96*  CALCIUM 10.8* 10.0 10.2 10.2  MG  --  1.8 2.0 2.3  PHOS  --  4.9* 5.2* 6.4*    Liver Function Tests:  Recent Labs Lab 02/13/14 2001  AST 28  ALT 19  ALKPHOS 95  BILITOT 2.5*  PROT 8.2  ALBUMIN 2.7*    Recent Labs Lab 02/13/14 2022  LIPASE 39   No results found for this basename: AMMONIA,  in the last 168 hours  CBC:  Recent Labs Lab 02/13/14 2001 02/14/14 0324 02/15/14 0400 02/16/14 0400  WBC 9.7 10.8* 9.2 11.2*  NEUTROABS 7.3  --   --   --   HGB 13.1 10.6* 9.6* 9.8*  HCT 40.3 32.3* 29.7* 29.8*  MCV 92.6 90.2 90.8 91.1  PLT 239 224 205 220    Cardiac Enzymes:  Recent Labs Lab 02/13/14 2022   TROPONINI <0.30    BNP: No components found with this basename: POCBNP,   CBG:  Recent Labs Lab 02/15/14 1148 02/15/14 1518 02/15/14 1959 02/15/14 2332 02/16/14 0355  GLUCAP 109* 93 104* 106* 103*    Microbiology: Results for orders placed during the hospital encounter of 02/13/14  MRSA PCR SCREENING     Status: Abnormal   Collection Time    02/14/14 12:48 AM      Result Value Ref Range Status   MRSA by PCR POSITIVE (*) NEGATIVE Final   Comment:            The GeneXpert MRSA Assay (FDA     approved for NASAL specimens     only), is one component of a     comprehensive MRSA colonization     surveillance program. It is not     intended to diagnose MRSA     infection nor to guide or     monitor treatment for     MRSA infections.     RESULT CALLED TO, READ BACK BY AND VERIFIED WITH:     NOTIFIED A CURRIN,RN 02/14/14 0222 BY RHOLMES  CULTURE, BLOOD (ROUTINE X  2)     Status: None   Collection Time    02/14/14  2:54 AM      Result Value Ref Range Status   Specimen Description BLOOD LEFT ARM   Final   Special Requests BOTTLES DRAWN AEROBIC ONLY 5CC   Final   Culture  Setup Time     Final   Value: 02/14/2014 11:29     Performed at [facility]   Culture     Final   Value:        BLOOD CULTURE RECEIVED NO GROWTH TO DATE CULTURE WILL BE HELD FOR 5 DAYS BEFORE ISSUING A FINAL NEGATIVE REPORT     Performed at [facility]   Report Status PENDING   Incomplete  CULTURE, BLOOD (ROUTINE X 2)     Status: None   Collection Time    02/14/14  3:00 AM      Result Value Ref Range Status   Specimen Description BLOOD LEFT ARM   Final   Special Requests BOTTLES DRAWN AEROBIC ONLY 3CC   Final   Culture  Setup Time     Final   Value: 02/14/2014 11:29     Performed at [facility]   Culture     Final   Value:        BLOOD CULTURE RECEIVED NO GROWTH TO DATE CULTURE WILL BE HELD FOR 5 DAYS BEFORE ISSUING A FINAL NEGATIVE REPORT     Performed at [facility]   Report Status PENDING   Incomplete  CULTURE, EXPECTORATED SPUTUM-ASSESSMENT     Status: None   Collection Time    02/14/14  9:30 AM      Result Value Ref Range Status   Specimen Description SPUTUM   Final   Special Requests NONE   Final   Sputum evaluation     Final   Value: MICROSCOPIC FINDINGS SUGGEST THAT THIS SPECIMEN IS NOT REPRESENTATIVE OF LOWER RESPIRATORY SECRETIONS. PLEASE RECOLLECT.     CALLED TO Barnet Pall RN 02/14/14 [place]   Report Status 02/14/2014 FINAL   Final    Coagulation Studies:  Recent Labs  02/13/14 2022 02/14/14 0431 02/14/14 0900 02/15/14 0400 02/16/14 0400  LABPROT 52.0* 68.5* 21.2* 16.1* 18.0*  INR 6.15* 8.82* 1.90* 1.32 1.53*    Urinalysis: No results found for this basename: COLORURINE, APPERANCEUR, LABSPEC, PHURINE, GLUCOSEU, HGBUR, BILIRUBINUR, KETONESUR, PROTEINUR, UROBILINOGEN, NITRITE, LEUKOCYTESUR,  in the last 72 hours    Imaging: Dg Chest Port 1 View  02/15/2014   CLINICAL DATA:  Rule out pneumothorax  EXAM: PORTABLE CHEST - 1 VIEW  COMPARISON:  02/14/2014  FINDINGS: Left jugular central venous catheter tip in the SVC is unchanged. NG tube in the stomach.  Bibasilar airspace disease right greater than left with right pleural effusion is unchanged. No pneumothorax.  IMPRESSION: Bibasilar atelectasis/ infiltrate and right pleural effusion unchanged. No pneumothorax.   Electronically Signed   By: Franchot Gallo M.D.   On: 02/15/2014 10:31   Dg Abd 2 Views  02/16/2014   CLINICAL DATA:  Abdominal pain.  EXAM: ABDOMEN - 2 VIEW  COMPARISON:  02/15/2014.  FINDINGS: There is a nasogastric tube coiled in the stomach. There are multiple dilated loops of small bowel which measure up to 4.2 cm. No free intraperitoneal air identified.  IMPRESSION: 1. No change in high-grade small bowel obstruction pattern   Electronically Signed   By: Kerby Moors M.D.   On: 02/16/2014 09:25   Dg Abd  Portable 1v  02/15/2014   CLINICAL DATA:   Small-bowel obstruction  EXAM: PORTABLE ABDOMEN - 1 VIEW  COMPARISON:  CT 02/13/2014  FINDINGS: Multiple dilated small bowel loops consistent with small bowel obstruction as noted on the CT. Upright view was not obtained to evaluate for air-fluid level or free air. No colonic gas is present.  IMPRESSION: High-grade small-bowel obstruction is unchanged.   Electronically Signed   By: Franchot Gallo M.D.   On: 02/15/2014 10:59     Medications:     . Chlorhexidine Gluconate Cloth  6 each Topical Q0600  . heparin subcutaneous  5,000 Units Subcutaneous 3 times per day  . insulin aspart  0-15 Units Subcutaneous 6 times per day  . mupirocin ointment  1 application Nasal BID  . pantoprazole (PROTONIX) IV  40 mg Intravenous QHS  . piperacillin-tazobactam (ZOSYN)  IV  2.25 g Intravenous Q8H  . sodium chloride  10-40 mL Intracatheter Q12H  . sodium chloride  3 mL Intravenous Q12H  . [START ON 02/17/2014] vancomycin  1,000 mg Intravenous Q T,Th,Sa-HD   sodium chloride, sodium chloride, sodium chloride, albuterol, feeding supplement (NEPRO CARB STEADY), heparin, lidocaine (PF), lidocaine-prilocaine, pentafluoroprop-tetrafluoroeth, sodium chloride, sodium chloride  Assessment/ Plan:  SBO- conservative Rx per surgery for now, NG drainage, on IV zosyn  2 Chronic R pleural effusion / bibasilar infiltrates- per CCM  3 ESRD  HD TTS 4 Afib - meds on hold, HR 110  5 CM EF 15%  6 DM 2 on insulin  7 HTPH cont vit D, po meds on hold  8 Anemia  Hb 9.8   9 R BKA  10 Mild hyperkalemia - resolved  11 Hypotension- stable no volume overload     LOS: 3 WEBB,MARTIN W @TODAY @11 :19 AM

## 2014-02-16 NOTE — Progress Notes (Addendum)
PULMONARY / CRITICAL CARE MEDICINE  Name: [name] [name] MRN: [id number] DOB: December 15, 1950    ADMISSION DATE:  02/13/2014  PRIMARY SERVICE: PCCM  CHIEF COMPLAINT:  SBO  BRIEF PATIENT DESCRIPTION:  63 yo with ESRD on HD, AF on Coumadin, cardiomyopathy with EF 20% transferred form [facility] where he was admitted for SBO and cecal pneumatosis.  SIGNIFICANT EVENTS / STUDIES:  CT abdomen >>> SBO with small area of pneumatosis at the tip of the cecum. Chronic right pleural effusion. Small pneumothorax.   LINES / TUBES: L IJ CVC 6/20 >>> NGT ??? >>>  CULTURES: 6/20  MRSA PCR >>> pos 6/20  Blood >>>  ANTIBIOTICS: Zosyn 6/20 >>> Vancomycin 6/20 >>>  INTERVAL HISTORY:  Blood in stool per RN.  VITAL SIGNS: Temp:  [97.8 F (36.6 C)-98.6 F (37 C)] 97.8 F (36.6 C) (06/22 0844) Pulse Rate:  [101-110] 106 (06/22 0300) Resp:  [17-29] 26 (06/22 0700) BP: (91-132)/(50-77) 106/72 mmHg (06/22 0700) SpO2:  [92 %-100 %] 99 % (06/22 0700) Weight:  [86.9 kg (191 lb 9.3 oz)] 86.9 kg (191 lb 9.3 oz) (06/22 0403) HEMODYNAMICS:   VENTILATOR SETTINGS:   INTAKE / OUTPUT: Intake/Output     06/21 0701 - 06/22 0700 06/22 0701 - 06/23 0700   P.O. 550    I.V. (mL/kg) 255 (2.9)    NG/GT 60    IV Piggyback 150    Total Intake(mL/kg) 1015 (11.7)    Emesis/NG output 1200    Other     Total Output 1200     Net -185          Stool Occurrence 1 x      PHYSICAL EXAMINATION: General:  No distress Neuro:  Awake, alert, cooperative HEENT:  NGT, moist membranes Cardiovascular:  Irregular, no murmurs Lungs:  Bilateral diminished air entry Abdomen:  Soft, mild generalized tenderness, bowel sounds present Musculoskeletal:  R AKA, no edema Skin:  Intact  LABS:  CBC  Recent Labs Lab 02/14/14 0324 02/15/14 0400 02/16/14 0400  WBC 10.8* 9.2 11.2*  HGB 10.6* 9.6* 9.8*  HCT 32.3* 29.7* 29.8*  PLT 224 205 220   Coag's  Recent Labs Lab 02/14/14 0900 02/15/14 0400 02/16/14 0400  INR 1.90* 1.32  1.53*   BMET  Recent Labs Lab 02/14/14 0324 02/15/14 0400 02/16/14 0400  NA 135* 140 139  K 5.8* 4.9 5.3  CL 89* 95* 93*  CO2 23 27 25   BUN 58* 35* 58*  CREATININE 9.63* 6.63* 8.96*  GLUCOSE 214* 130* 121*   Electrolytes  Recent Labs Lab 02/14/14 0324 02/15/14 0400 02/16/14 0400  CALCIUM 10.0 10.2 10.2  MG 1.8 2.0 2.3  PHOS 4.9* 5.2* 6.4*   Sepsis Markers  Recent Labs Lab 02/13/14 2306 02/14/14 0324 02/15/14 0400  LATICACIDVEN 4.6* 2.8* 1.2   ABG  Recent Labs Lab 02/13/14 2235  PHART 7.406  PCO2ART 36.5  PO2ART 65.4*   Liver Enzymes  Recent Labs Lab 02/13/14 2001  AST 28  ALT 19  ALKPHOS 95  BILITOT 2.5*  ALBUMIN 2.7*   Cardiac Enzymes  Recent Labs Lab 02/13/14 2022  TROPONINI <0.30   Glucose  Recent Labs Lab 02/15/14 0714 02/15/14 1148 02/15/14 1518 02/15/14 1959 02/15/14 2332 02/16/14 0355  GLUCAP 124* 109* 93 104* 106* 103*   IMAGING:  Dg Chest Port 1 View  02/15/2014   CLINICAL DATA:  Rule out pneumothorax  EXAM: PORTABLE CHEST - 1 VIEW  COMPARISON:  02/14/2014  FINDINGS: Left jugular central venous catheter tip  in the SVC is unchanged. NG tube in the stomach.  Bibasilar airspace disease right greater than left with right pleural effusion is unchanged. No pneumothorax.  IMPRESSION: Bibasilar atelectasis/ infiltrate and right pleural effusion unchanged. No pneumothorax.   Electronically Signed   By: Franchot Gallo M.D.   On: 02/15/2014 10:31   Dg Abd Portable 1v  02/15/2014   CLINICAL DATA:  Small-bowel obstruction  EXAM: PORTABLE ABDOMEN - 1 VIEW  COMPARISON:  CT 02/13/2014  FINDINGS: Multiple dilated small bowel loops consistent with small bowel obstruction as noted on the CT. Upright view was not obtained to evaluate for air-fluid level or free air. No colonic gas is present.  IMPRESSION: High-grade small-bowel obstruction is unchanged.   Electronically Signed   By: Franchot Gallo M.D.   On: 02/15/2014 10:59   ASSESSMENT /  PLAN:  PULMONARY A: Chronic R pleural effusion Possible aspiration pneumonia P:   Goal SpO2>92 Supplemental oxygen PRN May eventually need thoracentesis Albuterol PRN  CARDIOVASCULAR A:  Chronic hypotension ( SBP ~ 90 ) Chronic systolic heart failure AF, controlled rate Lactate reassuring P:  Goal SBP>90 Midodrine when able Hold preadmission Metoprolol, Diltiazem, Amiodarone  RENAL A:   ESRD on HD P:   Renal following HD Trend BMP  GASTROINTESTINAL A:   Cecal pneumatosis, etiology unclear Possible GI hemorrhage Nutrition GERD on PPI P:   Surgery following NPO NGT to Sx May need TNA Protonix  Will need repeated colonoscopy, may need EGD  HEMATOLOGIC A:   Chronic anticoagulation for AF Supratherapeutic INR on admission Acute on chronic anemia VTE Px P:  Hold Coumadin as surgical interventions may be required Hold Heparin IV as low risk and may have GI hemorrhage Start Heparin for VTE Px  INFECTIOUS A:   Possible aspiration pneumonia Possible intraabdominal infection P:   Abx /cx as above  ENDOCRINE A:   DM P:   SSI  NEUROLOGIC A:   No active issues P:   No intervention required  I have personally obtained history, examined patient, evaluated and interpreted laboratory and imaging results, reviewed medical records, formulated assessment / plan and placed orders.  Doree Fudge, MD Pulmonary and [place] Pager: [phone]  02/16/2014, 9:48 AM

## 2014-02-17 ENCOUNTER — Inpatient Hospital Stay (HOSPITAL_COMMUNITY): Payer: Medicare Other

## 2014-02-17 DIAGNOSIS — I2510 Atherosclerotic heart disease of native coronary artery without angina pectoris: Secondary | ICD-10-CM

## 2014-02-17 LAB — CBC
HEMATOCRIT: 30.7 % — AB (ref 39.0–52.0)
Hemoglobin: 10.1 g/dL — ABNORMAL LOW (ref 13.0–17.0)
MCH: 29.3 pg (ref 26.0–34.0)
MCHC: 32.9 g/dL (ref 30.0–36.0)
MCV: 89 fL (ref 78.0–100.0)
PLATELETS: 216 10*3/uL (ref 150–400)
RBC: 3.45 MIL/uL — AB (ref 4.22–5.81)
RDW: 15.9 % — ABNORMAL HIGH (ref 11.5–15.5)
WBC: 12 10*3/uL — AB (ref 4.0–10.5)

## 2014-02-17 LAB — GLUCOSE, CAPILLARY
GLUCOSE-CAPILLARY: 107 mg/dL — AB (ref 70–99)
GLUCOSE-CAPILLARY: 88 mg/dL (ref 70–99)
Glucose-Capillary: 109 mg/dL — ABNORMAL HIGH (ref 70–99)
Glucose-Capillary: 114 mg/dL — ABNORMAL HIGH (ref 70–99)
Glucose-Capillary: 117 mg/dL — ABNORMAL HIGH (ref 70–99)
Glucose-Capillary: 81 mg/dL (ref 70–99)
Glucose-Capillary: 94 mg/dL (ref 70–99)

## 2014-02-17 LAB — BASIC METABOLIC PANEL
BUN: 78 mg/dL — ABNORMAL HIGH (ref 6–23)
CO2: 21 meq/L (ref 19–32)
CREATININE: 10.97 mg/dL — AB (ref 0.50–1.35)
Calcium: 10.2 mg/dL (ref 8.4–10.5)
Chloride: 90 mEq/L — ABNORMAL LOW (ref 96–112)
GFR calc Af Amer: 5 mL/min — ABNORMAL LOW (ref >90)
GFR calc non Af Amer: 4 mL/min — ABNORMAL LOW (ref >90)
Glucose, Bld: 114 mg/dL — ABNORMAL HIGH (ref 70–99)
Potassium: 5.8 mEq/L — ABNORMAL HIGH (ref 3.7–5.3)
Sodium: 139 mEq/L (ref 137–147)

## 2014-02-17 LAB — FERRITIN: Ferritin: 1899 ng/mL — ABNORMAL HIGH (ref 22–322)

## 2014-02-17 LAB — PROTIME-INR
INR: 1.89 — AB (ref 0.00–1.49)
PROTHROMBIN TIME: 21.1 s — AB (ref 11.6–15.2)

## 2014-02-17 LAB — CLOSTRIDIUM DIFFICILE BY PCR: Toxigenic C. Difficile by PCR: POSITIVE — AB

## 2014-02-17 MED ORDER — HEPARIN SODIUM (PORCINE) 1000 UNIT/ML DIALYSIS: 1000.0000 [IU] | INTRAMUSCULAR | Status: DC | PRN

## 2014-02-17 MED ORDER — NEPRO/CARBSTEADY PO LIQD
237.0000 mL | ORAL | Status: DC | PRN
  Filled 2014-02-17: qty 237

## 2014-02-17 MED ORDER — CHLORHEXIDINE GLUCONATE 0.12 % MT SOLN
15.0000 mL | Freq: Two times a day (BID) | OROMUCOSAL | Status: DC
  Administered 2014-02-17 – 2014-02-18 (×3): 15 mL via OROMUCOSAL
  Filled 2014-02-17 (×3): qty 15

## 2014-02-17 MED ORDER — LIDOCAINE-PRILOCAINE 2.5-2.5 % EX CREA
1.0000 "application " | TOPICAL_CREAM | CUTANEOUS | Status: DC | PRN
  Filled 2014-02-17: qty 5

## 2014-02-17 MED ORDER — ALTEPLASE 2 MG IJ SOLR
2.0000 mg | Freq: Once | INTRAMUSCULAR | Status: AC | PRN
  Filled 2014-02-17: qty 2

## 2014-02-17 MED ORDER — FUROSEMIDE 10 MG/ML IJ SOLN
INTRAMUSCULAR | Status: AC
  Filled 2014-02-17: qty 2

## 2014-02-17 MED ORDER — LIDOCAINE HCL (PF) 1 % IJ SOLN: 5.0000 mL | INTRAMUSCULAR | Status: DC | PRN

## 2014-02-17 MED ORDER — METRONIDAZOLE IN NACL 5-0.79 MG/ML-% IV SOLN
500.0000 mg | Freq: Three times a day (TID) | INTRAVENOUS | Status: DC
  Administered 2014-02-17 – 2014-02-21 (×10): 500 mg via INTRAVENOUS
  Filled 2014-02-17 (×17): qty 100

## 2014-02-17 MED ORDER — SODIUM CHLORIDE 0.9 % IV SOLN: 100.0000 mL | INTRAVENOUS | Status: DC | PRN

## 2014-02-17 MED ORDER — PENTAFLUOROPROP-TETRAFLUOROETH EX AERO: 1.0000 "application " | INHALATION_SPRAY | CUTANEOUS | Status: DC | PRN

## 2014-02-17 NOTE — Consult Note (Signed)
CARDIOLOGY CONSULT NOTE   Patient ID: [name] [name] MRN: [id number], DOB/AGE: 12/30/1950   Admit date: 02/13/2014 Date of Consult: 02/17/2014   Primary Physician: [name] [name]., MD Primary Cardiologist: Dr. Domenic [name]  Pt. Profile  63 yo male with PMH of CAD s/p 4v CABG, permanent a-fib, MVR, ischemic cardiomyopathy, HTN, DM, and hyperlipidemia presented with 2 wk onset of nausea and 3 day on set of vomiting and abdominal pain. CT of abdomen is concerning for small bowel obstruction. C-diff positive. Cardiology consulted for preop assessment prior to possible Ex Lap  Problem List  Past Medical History  Diagnosis Date  . UGI bleed 02/12/11    On anticoagulation; EGD w/[name] polypectomy-multiple polypoid lesions antrum(benign), Chronic active gastritis (NEGATIVE H pylori)  . Anemia, chronic disease   . Sepsis due to enterococcus 02/11/11  . Atrial fibrillation     On coumadin  . Coronary atherosclerosis of native coronary artery     Multivessel status post CABG  . Cardiomyopathy     LVEF 40-45% 01/2011  . Type 2 diabetes mellitus   . Essential hypertension, benign   . Hyperlipemia   . Gout   . S/P patent foramen ovale closure   . ESRD on hemodialysis     Started dialysis sometime around 2008-2009.  Gets HD in [place], [place] with Dr [name] [name] on a TTS schedule.  Runs 3.5 hours with dry wt 86.5 kg as of June 2015.  Had a L arm AVF which didn't work and has been using his R forearm AVF for several years.    . History of nuclear stress test 07/04/2010    dipyridamole; mod fixed inferolateral defect; non-diagnostic for ischemia; low risk scan   . Hyperbilirubinemia 08/10/2013    Past Surgical History  Procedure Laterality Date  . Mitral valve replacement  01/05/2007    Bioprosthetic 38mm Edwards precordial tissue (Dr. Servando [name])  . Coronary artery bypass graft  01/05/2007    LIMA-LAD, SVG-OM, seq. SVG-PDA, PLA-RCA  . Colonoscopy  06/23/2011    Dr. Oneida [name]: multiple sessile and pedunculated  polyps, moderate diverticulosis, internal hemorrhoids, +ADENOMATOUS POLYPS, consider genetic testing, surveillance in October 2015   . Esophagogastroduodenoscopy  02/12/2011    CHRONIC ACTIVE GASTRITIS, NO H. pylori  . Transthoracic echocardiogram  11/2011    EF 25% w/ mod dilatation of LV & mod LVH; LA severely dilated; mod-severe dilation of RV with mod-severe decrease in RV function; mild MR & TR  . Cardioversion  04/19/2007    [name] [name]  . Transthoracic echocardiogram  02/14/2011    EF 40-45%, mod conc LVH; ventricular septum with motion showing abnormal function, dyssynergy, paradox; mild AS; mild-mod MR stenosis; LA severely dilated; aptrial septum bowed from L to R with increased LA pressure   . Amputation Right 07/14/2013    Procedure: AMPUTATION DIGIT;  Surgeon: [name] So, MD;  Location: [facility];  Service: General;  Laterality: Right;  . Amputation Right 08/08/2013    Procedure: AMPUTATION BELOW KNEE;  Surgeon: [name] So, MD;  Location: [facility];  Service: General;  Laterality: Right;  . Stump revision Right 09/19/2013    Procedure: STUMP REVISION;  Surgeon: [name] Ran, MD;  Location: [facility];  Service: General;  Laterality: Right;  . Amputation Right 10/17/2013    Procedure: AMPUTATION ABOVE KNEE;  Surgeon: [name] So, MD;  Location: [facility];  Service: General;  Laterality: Right;     Allergies  Allergies  Allergen Reactions  . Bacitracin Other (See Comments)  unknown  . Norvasc [Amlodipine Besylate] Other (See Comments)    unknown    HPI   The patient is a 63 year old African American male who has a [name]-standing history of coronary artery disease status post four-vessel bypass in 2008, history of severe mitral regurg status post bioprosthetic valve in May 2008, permanent atrial fibrillation, ischemic cardiomyopathy with baseline EF 20%, diabetes, hypertension, hyperlipidemia, and end-stage renal disease on hemodialysis T/Th/Sat. Of note, he also had left  atrial appendage closure and right-sided Maze procedure because of preoperative A. fib with rapid ventricular rate in May 2008, since then, he has been left in permanent atrial fibrillation on Coumadin and rate control medication. His last stress test was in April 2013 at [facility] which showed no evidence of ischemia, however reduction in LV EF 20% with chamber dilatation.   Patient recently underwent right below-knee amputation in December 2014, and then had a subsequent revision to the above knee amputation in February 2015. Therefore, his activity level has been relatively decreased. However per patient, he continued to be active at home getting use to his prosthetic limb. He states he frequently walk about a half an hour to an hour at a time without significant exertional chest discomfort or shortness breath. He denies any recent lower extremity edema, orthopnea or paroxysmal nocturnal dyspnea. He denies any recent fever, chill, or cough. His last followup with Dr. Domenic [name] was on 12/20/2013 at which time a followup echocardiogram was obtained. Echocardiogram was eventually obtained on 12/25/2013 which showed EF 15-20% which looks to be stable when compared to the previous myoview study, mitral valve function appears to be grossly normal. It is not clear whether or not the patient was a candidate for ICD placement considering his other comorbidities including end-stage renal disease.  Patient has been having nausea for the past 2 weeks, had 3 days of vomiting and abdominal pain which prompted him to seek medical attention at [facility] on 02/13/2014. Upon arrival, significant laboratory included supratherapeutic INR of 6.15, glucose 199, normal white blood cell count and hemoglobin, creatinine of 9.42, potassium of 5.7. EKG showed atrial fibrillation with ventricular rate 100. CT of the abdomen obtained showed chronic right pleural effusion with associated right lower lobe  atelectasis, however cannot exclude right sided pneumonia, small bowel obstruction pattern from the proximal small bowel to distal small bowel with poor progression of oral contrast, small pneumothorax noted in the anterior right middle lobe. Repeat chest x-ray confirmed the presence of small pneumothorax in the right middle lobe. Patient was also noted to have low blood pressure which prompted the discontinuation of both Coumadin and rate control medication. His heart rate has been consistently in the 100-110s throughout the entire admission. Surgery was consulted for small bowel obstruction and is considering exploratory lap. Of note, patient's white blood cell count has been trending up. This morning he had one bowel movement and has been tested positive for C. Difficile. Pending surgical procedure may be delayed due to presence of C. difficile.   Inpatient Medications  . Chlorhexidine Gluconate Cloth  6 each Topical Q0600  . heparin subcutaneous  5,000 Units Subcutaneous 3 times per day  . insulin aspart  0-15 Units Subcutaneous 6 times per day  . mupirocin ointment  1 application Nasal BID  . pantoprazole (PROTONIX) IV  40 mg Intravenous QHS  . piperacillin-tazobactam (ZOSYN)  IV  2.25 g Intravenous Q8H  . sodium chloride  10-40 mL Intracatheter Q12H  . sodium chloride  3  mL Intravenous Q12H  . vancomycin  1,000 mg Intravenous Q T,Th,Sa-HD    Family History Family History  Problem Relation Age of Onset  . Diabetes Mother   . Diabetes Father   . Diabetes Sister   . Colon cancer Neg Hx      Social History History   Social History  . Marital Status: Married    Spouse Name: N/A    Number of Children: 1  . Years of Education: N/A   Occupational History  . Retired     [employer]  .     Social History Main Topics  . Smoking status: Former Smoker -- 1.00 packs/day for 8 years    Types: Cigarettes    Quit date: 11/27/2006  . Smokeless tobacco: Never Used     Comment:  quit about 5 yrs  . Alcohol Use: No  . Drug Use: No  . Sexual Activity: Not on file   Other Topics Concern  . Not on file   Social History Narrative  . No narrative on file     Review of Systems  General:  No chills, fever, night sweats or weight changes.  Cardiovascular:  No chest pain, dyspnea on exertion, edema, orthopnea, palpitations, paroxysmal nocturnal dyspnea. Dermatological: No rash, lesions/masses Respiratory: No cough, dyspnea Urologic: No hematuria, dysuria Abdominal:   No bright red blood per rectum, melena, or hematemesis. +nausea and vomiting. Little bowel movement, last BM this morning Neurologic:  No visual changes, wkns, changes in mental status. All other systems reviewed and are otherwise negative except as noted above.  Physical Exam  Blood pressure 104/61, pulse 106, temperature 97.6 F (36.4 C), temperature source Oral, resp. rate 20, height 6\' 4"  (1.93 m), weight 189 lb 2.5 oz (85.8 kg), SpO2 100.00%.  General: Pleasant, NAD Psych: Normal affect. Neuro: Alert and oriented X 3. HEENT: Normal. Nasogastric tube present  Neck: Supple without bruits or JVD. Lungs:  Resp regular and unlabored, markedly decreased breath sound in RLL, no significant rale, wheezing or rhonchi. Heart: irregular HR, tachycardic no s3, s4, or murmurs. Abdomen: Soft. Occasional bowel sound. Distended. Diffusely tender. Extremities: No clubbing, cyanosis or edema. R forearm AVF present. R above knee amputation. L lower extremity has no significant edema  Labs  No results found for this basename: CKTOTAL, CKMB, TROPONINI,  in the last 72 hours Lab Results  Component Value Date   WBC 12.0* 02/17/2014   HGB 10.1* 02/17/2014   HCT 30.7* 02/17/2014   MCV 89.0 02/17/2014   PLT 216 02/17/2014    Recent Labs Lab 02/13/14 2001  02/17/14 0800  NA 140  < > 139  K 5.7*  < > 5.8*  CL 91*  < > 90*  CO2 24  < > 21  BUN 54*  < > 78*  CREATININE 9.42*  < > 10.97*  CALCIUM 10.8*  < >  10.2  PROT 8.2  --   --   BILITOT 2.5*  --   --   ALKPHOS 95  --   --   ALT 19  --   --   AST 28  --   --   GLUCOSE 199*  < > 114*  < > = values in this interval not displayed. No results found for this basename: CHOL, HDL, LDLCALC, TRIG   No results found for this basename: DDIMER    Radiology/Studies  Dg Chest 2 View  02/13/2014   CLINICAL DATA:  Shortness of breath and weakness.  EXAM: CHEST  2 VIEW  COMPARISON:  CT of the abdomen and pelvis performed earlier today at 9:41 p.m., and chest radiograph from 09/17/2013  FINDINGS: Trace right-sided pneumothorax is seen. A small to moderate right-sided pleural effusion is noted, with associated airspace opacification. This may reflect atelectasis or pneumonia. Mild left basilar opacity may also reflect pneumonia, given the appearance on CT.  The heart is borderline enlarged. The patient is status post median sternotomy, with evidence of prior CABG. No acute osseous abnormalities are seen.  IMPRESSION: 1. Trace right-sided pneumothorax seen, as noted on CT. 2. Small-to-moderate right-sided pleural effusion, with associated airspace opacification; this may reflect atelectasis or pneumonia. Mild left basilar pneumonia seen. 3. Borderline cardiomegaly.   Electronically Signed   By: Garald Balding M.D.   On: 02/13/2014 23:22   Ct Abdomen Pelvis W Contrast  02/13/2014   CLINICAL DATA:  Abdominal pain, end-stage renal disease  EXAM: CT ABDOMEN AND PELVIS WITH CONTRAST  TECHNIQUE: Multidetector CT imaging of the abdomen and pelvis was performed using the standard protocol following bolus administration of intravenous contrast.  CONTRAST:  100 mL Isovue  COMPARISON:  CT chest 12/06/2011  FINDINGS: There is a chronic right pleural effusion with associated dense right basilar atelectasis within air bronchograms. Cannot exclude right lower lobe pneumonia. There is a small pneumothorax anterior to the right middle lobe. No pericardial fluid. Mild airspace disease  in the lower left lower lobe could represent pneumonia or aspiration pneumonitis.  No focal hepatic lesion. The gallbladder, pancreas, spleen, adrenal glands, are normal. The kidneys show minimal enhancement. Cyst of the left kidney.  The stomach contains a moderate amount of oral contrast. The duodenum and jejunum are dilated with poor progression of the oral contrast. Proximal small bowel loops measure 4 cm. The most distal small bowel is near completely collapsed in the pelvis (images 78, series 2). At discrete transition point is not identified. There is gas within the wall of the cecum tip as seen on coronal image 51, series 4. The appendix appears normal. The ascending colon is collapsed. The transverse and descending colon are collapsed.  Abdominal aorta is heavily calcified. No retroperitoneal periportal lymphadenopathy.  No free fluid the pelvis. The prostate gland and bladder normal. No pelvic lymphadenopathy. There is bilateral inguinal hernias. No aggressive osseous lesion. There is chronic appearing compression fracture of the L1 vertebral body.  IMPRESSION: 1. Chronic right pleural effusion with associated right lower lobe atelectasis. Cannot exclude right lower lobe pneumonia. 2. Small pneumothorax is noted anterior to the right middle lobe. Recommend chest radiograph to exclude a larger pneumothorax. 3. Pneumonitis versus pneumonia at the left lung base. Consider aspiration. 4. Small bowel obstruction pattern with change in caliber from the proximal small bowel to the distal small bowel with a poor progression oral contrast. No transition point identified. 5. Pneumatosis of the tip of the cecum. This could be a benign pneumatosis but cannot exclude ischemia. 6. Appendix is normal. Findings conveyed toCHRISTOPHER POLLINA on 02/13/2014  at22:02.   Electronically Signed   By: Suzy Bouchard M.D.   On: 02/13/2014 22:02   Dg Chest Port 1 View  02/15/2014   CLINICAL DATA:  Rule out pneumothorax  EXAM:  PORTABLE CHEST - 1 VIEW  COMPARISON:  02/14/2014  FINDINGS: Left jugular central venous catheter tip in the SVC is unchanged. NG tube in the stomach.  Bibasilar airspace disease right greater than left with right pleural effusion is unchanged. No pneumothorax.  IMPRESSION: Bibasilar atelectasis/ infiltrate  and right pleural effusion unchanged. No pneumothorax.   Electronically Signed   By: Franchot Gallo M.D.   On: 02/15/2014 10:31   Dg Chest Port 1 View  02/14/2014   CLINICAL DATA:  Central line and nasogastric tube placement.  EXAM: PORTABLE CHEST - 1 VIEW  COMPARISON:  Chest radiograph from 02/13/2014  FINDINGS: The patient's left IJ line is seen ending about the mid to distal SVC. An enteric tube is noted extending below the diaphragm.  There is a mildly worsened small to moderate right-sided pleural effusion. The previously noted trace right pneumothorax is suggested but not well seen. Worsening bibasilar airspace opacification may reflect worsening pneumonia. Asymmetric interstitial edema might have a similar appearance. Underlying vascular congestion is seen.  The cardiomediastinal silhouette is enlarged. The patient status post median sternotomy, with evidence of prior CABG. No acute osseous abnormalities are seen.  IMPRESSION: 1. Left IJ line seen ending about the mid to distal SVC. 2. Enteric tube noted extending below the diaphragm. 3. Mildly worsened small to moderate right-sided pleural effusion. Previously noted trace right pneumothorax is suggested but not well seen. 4. Worsening bibasilar airspace opacification may reflect worsening pneumonia. Asymmetric interstitial edema might have a similar appearance. 5. Underlying vascular congestion and cardiomegaly noted.   Electronically Signed   By: Garald Balding M.D.   On: 02/14/2014 03:04   Dg Abd 2 Views  02/16/2014   CLINICAL DATA:  Abdominal pain.  EXAM: ABDOMEN - 2 VIEW  COMPARISON:  02/15/2014.  FINDINGS: There is a nasogastric tube coiled in  the stomach. There are multiple dilated loops of small bowel which measure up to 4.2 cm. No free intraperitoneal air identified.  IMPRESSION: 1. No change in high-grade small bowel obstruction pattern   Electronically Signed   By: Kerby Moors M.D.   On: 02/16/2014 09:25   Dg Abd Portable 1v  02/17/2014   CLINICAL DATA:  Small-bowel obstruction.  EXAM: PORTABLE ABDOMEN - 1 VIEW  COMPARISON:  02/16/2014.  FINDINGS: Persistent distention of small bowel loops noted. Bowel dilatation up to 4.2 cm again noted. No significant interval improvement. NG tube noted coiled in stomach. The stomach is nondistended. Paucity of colonic gas present. Aortoiliac atherosclerotic vascular disease. Degenerative changes lumbar spine and both hips.  IMPRESSION: Persistent unchanged small-bowel obstruction. NG tube noted coiled in stomach. The stomach is nondistended .   Electronically Signed   By: Marcello Moores  Register   On: 02/17/2014 08:10   Dg Abd Portable 1v  02/15/2014   CLINICAL DATA:  Small-bowel obstruction  EXAM: PORTABLE ABDOMEN - 1 VIEW  COMPARISON:  CT 02/13/2014  FINDINGS: Multiple dilated small bowel loops consistent with small bowel obstruction as noted on the CT. Upright view was not obtained to evaluate for air-fluid level or free air. No colonic gas is present.  IMPRESSION: High-grade small-bowel obstruction is unchanged.   Electronically Signed   By: Franchot Gallo M.D.   On: 02/15/2014 10:59    ECG  Atrial fibrillation with uncontrolled HR  ASSESSMENT AND PLAN  1. Small bowel obstruction  - Surgery following, requested preop eval  - based on Lee's criteria, this is a high risk patient undergoing high risk procedure with estimated rate of myocardial infarction, PE, V-fib, cardiac arrest and complete heart block >11%. He has been taken off metoprolol during this admission due to hypotension, although presence of metoprolol may reduce his overall surgical risk  - once BP improve consider adding back his  BB  - careful fluid resuscitation  during procedure as patient has significant ischemic cardiomyopathy and has high risk for fluid overload  - seems to be active at home without exertional symptom, has recent echo which EF is stable when compared to stress test in 2013, unclear if need further test before surgery which will likely delayed at this point due to positive c-diff  2. Positive c-diff  - may delay surgery as it can be cause of small bowel obstruction  3. CAD s/p CABG LIMA to LAD, SVG to OM, seq SVG to PDA, PLA to RCA  - Cath 01/13/2011 severe 3 vessel dx with 4+ MR  - Stress test   - Echo 12/25/2013 EF 15-20% which looks to be stable when compared to the previous marrow view study, mitral valve function appears to be grossly normal  4. permanent atrial fibrillation  - s/p left atrial appendage closure and right-sided Maze procedure  - would like to restart BB as soon as possible, however if BP low, ?consider increasing amiodarone for rate control (take 100mg  daily at home)  - supratherapeutic INR on arrival, coumadin held  5. ischemic cardiomyopathy with baseline EF 20%, diabetes  - stable based on echo in 11/2013 6. Hypertension 7. Hyperlipidemia 8. End-stage renal disease on hemodialysis T/Th/Sat  - nephrology following   Signed, Almyra Deforest, PA-C 02/17/2014, 12:31 PM   Attending Note:   The patient was seen and examined.  Agree with assessment and plan as noted above.  Changes made to the above note as needed.  Pt has numerous medical problems as well as cardiac issues.  Fortunately, he seems to have been stable from a cardiac standpoint and has not had recent worsening of angina or CHF.    He is at somewhat of a high surgical risk based on his underlying medical issues but he seems to be as "tuned up" as possible given his acute SBO.  Since his BP remains low, I would not try to restart Beta blockers just to attempt to lower his perioperative risk.     No further recs at  this time.  Thayer Headings, Brooke Bonito., MD, [facility] 02/17/2014, 2:39 PM

## 2014-02-17 NOTE — Progress Notes (Signed)
ANTIBIOTIC CONSULT NOTE - FOLLOW UP  Pharmacy Consult for Vancomycin and Zosyn Indication: rule out pneumonia  Allergies  Allergen Reactions  . Bacitracin Other (See Comments)    unknown  . Norvasc [Amlodipine Besylate] Other (See Comments)    unknown    Patient Measurements: Height: 6\' 4"  (193 cm) Weight: 190 lb 14.7 oz (86.6 kg) IBW/kg (Calculated) : 86.8  Vital Signs: Temp: 97.5 F (36.4 C) (06/23 1336) Temp src: Oral (06/23 1336) BP: 96/57 mmHg (06/23 1530) Pulse Rate: 123 (06/23 1530) Intake/Output from previous day: 06/22 0701 - 06/23 0700 In: 855 [P.O.:360; I.V.:255; NG/GT:90; IV Piggyback:150] Out: 800 [Emesis/NG output:800] Intake/Output from this shift: Total I/O In: 80 [I.V.:30; IV Piggyback:50] Out: -   Labs:  Recent Labs  02/15/14 0400 02/16/14 0400 02/17/14 0800  WBC 9.2 11.2* 12.0*  HGB 9.6* 9.8* 10.1*  PLT 205 220 216  CREATININE 6.63* 8.96* 10.97*   Estimated Creatinine Clearance: 8.2 ml/min (by C-G formula based on Cr of 10.97).    Microbiology: Vanc 6/19>> Zosyn 6/19>> Flagyl IV 6/23>>  6/20 Blood - NGTD 6/23 cdiff +  Assessment: 63 yo M continues on day#5 of Vancomycin and Zosyn for PNA.  Clinically pt improving from PNA with stable WBC and weaned off O2.  Now found to be cdiff positive and also started on IV Flagyl.  Would recommend discontinuing abx for PNA as soon as possible in the setting of cdiff.  Goal of Therapy:  Pre-HD Vancomycin level ~ 25 mcg/ml Renal dose adjustment of medications  Plan:  1. Continue zosyn 2.25gm IV Q8H 2. Continue Vancomycin 1gm post-HD 3. F/u renal plans, C&S, clinical status and pre-HD level when appropriate   [employer], Pharm.D., BCPS Clinical Pharmacist Pager [phone] 02/17/2014 4:00 PM

## 2014-02-17 NOTE — Procedures (Signed)
I have seen and examined this patient and agree with the plan of care. Seen on dialysis HD  BFR 400  WEBB,MARTIN W 02/17/2014, 2:28 PM

## 2014-02-17 NOTE — Progress Notes (Signed)
CRITICAL VALUE ALERT  Critical value received:  CDifficile positive  Date of notification:  02/17/2014  Time of notification: 0981  Critical value read back:yes  Nurse who received alert:  Beckie Salts, RN  MD notified (1st page):  Donne Hazel, Dr. Oliver Pila  Time of first page:  1200 Dr. Donne Hazel, 1300 Dr. Oliver Pila  MD notified (2nd page):  Time of second page:  Responding MD:  Dr. Oliver Pila  Time MD responded:  1300

## 2014-02-17 NOTE — Progress Notes (Signed)
[facility] Progress Note     Subjective: Pt feels okay.  Pain improved, no N/V since NG clamped.  Abdomen still distended.  NG output 800/24hour but usually 100-211mL every 6hours.  Nurses say he's having bloody stools.  Objective: Vital signs in last 24 hours: Temp:  [97.4 F (36.3 C)-98 F (36.7 C)] 97.5 F (36.4 C) (06/23 0722) Pulse Rate:  [50-123] 106 (06/23 0700) Resp:  [16-27] 24 (06/23 0700) BP: (98-124)/(61-92) 116/75 mmHg (06/23 0700) SpO2:  [95 %-100 %] 100 % (06/23 0700) Weight:  [189 lb 2.5 oz (85.8 kg)] 189 lb 2.5 oz (85.8 kg) (06/23 0500) Last BM Date: 02/15/14  Intake/Output from previous day: 02/20/2023 0701 - 06/23 0700 In: 855 [P.O.:360; I.V.:255; NG/GT:90; IV Piggyback:150] Out: 800 [Emesis/NG output:800] Intake/Output this shift:    PE: Gen: Alert, NAD, pleasant, up to chair  Abd: Soft, less distended, minimal tenderness in the lower abdomen, few BS heard, no HSM, small umbilical hernia palpated and seen on CT containing a small amount of fat, no abdominal scars noted, red tinge in bloody BM   Lab Results:   Recent Labs  02/15/14 0400 02/19/2014 0400  WBC 9.2 11.2*  HGB 9.6* 9.8*  HCT 29.7* 29.8*  PLT 205 220   BMET  Recent Labs  02/15/14 0400 2014-02-19 0400  NA 140 139  K 4.9 5.3  CL 95* 93*  CO2 27 25  GLUCOSE 130* 121*  BUN 35* 58*  CREATININE 6.63* 8.96*  CALCIUM 10.2 10.2   PT/INR  Recent Labs  02-19-14 0400 02/17/14 0400  LABPROT 18.0* 21.1*  INR 1.53* 1.89*   CMP     Component Value Date/Time   NA 139 02/19/14 0400   K 5.3 2014-02-19 0400   K 4.4 05/04/2011 1426   CL 93* 2014-02-19 0400   CL 101 05/04/2011 1427   CO2 25 Feb 19, 2014 0400   CO2 25 05/04/2011 1427   GLUCOSE 121* 02/19/14 0400   BUN 58* 02-19-2014 0400   CREATININE 8.96* 02/19/14 0400   CALCIUM 10.2 2014-02-19 0400   CALCIUM 8.8 05/04/2011 1426   PROT 8.2 02/13/2014 2001   PROT 6.4 05/04/2011 1427   ALBUMIN 2.7* 02/13/2014 2001   AST 28 02/13/2014 2001    AST 22 05/04/2011 1427   ALT 19 02/13/2014 2001   ALKPHOS 95 02/13/2014 2001   BILITOT 2.5* 02/13/2014 2001   GFRNONAA 5* Feb 19, 2014 0400   GFRAA 6* 02-19-14 0400   Lipase     Component Value Date/Time   LIPASE 39 02/13/2014 2022       Studies/Results: Dg Abd 2 Views  19-Feb-2014   CLINICAL DATA:  Abdominal pain.  EXAM: ABDOMEN - 2 VIEW  COMPARISON:  02/15/2014.  FINDINGS: There is a nasogastric tube coiled in the stomach. There are multiple dilated loops of small bowel which measure up to 4.2 cm. No free intraperitoneal air identified.  IMPRESSION: 1. No change in high-grade small bowel obstruction pattern   Electronically Signed   By: Kerby Moors M.D.   On: 19-Feb-2014 09:25    Anti-infectives: Anti-infectives   Start     Dose/Rate Route Frequency Ordered Stop   02/17/14 1200  vancomycin (VANCOCIN) IVPB 1000 mg/200 mL premix     1,000 mg 200 mL/hr over 60 Minutes Intravenous Every T-Th-Sa (Hemodialysis) 02-19-14 1007     02/14/14 1800  vancomycin (VANCOCIN) IVPB 1000 mg/200 mL premix     1,000 mg 200 mL/hr over 60 Minutes Intravenous  Once 02/14/14 1205 02/14/14  2232   02/14/14 0230  piperacillin-tazobactam (ZOSYN) IVPB 2.25 g     2.25 g 100 mL/hr over 30 Minutes Intravenous Every 8 hours 02/14/14 0219     02/13/14 2330  vancomycin (VANCOCIN) 1,500 mg in sodium chloride 0.9 % 500 mL IVPB     1,500 mg 250 mL/hr over 120 Minutes Intravenous  Once 02/13/14 2251 02/14/14 0124   02/13/14 2300  piperacillin-tazobactam (ZOSYN) IVPB 3.375 g  Status:  Discontinued     3.375 g 100 mL/hr over 30 Minutes Intravenous  Once 02/13/14 2250 02/14/14 0219   02/13/14 2259  vancomycin (VANCOCIN) 1 GM/200ML IVPB  Status:  Discontinued    Comments:  Eulis Manly   : cabinet override      02/13/14 2259 02/13/14 2304   02/13/14 2245  levofloxacin (LEVAQUIN) IVPB 750 mg     750 mg 100 mL/hr over 90 Minutes Intravenous  Once 02/13/14 2238 02/14/14 0022       Assessment/Plan SBO without  obvious cause - no h/o hernias, abdominal surgery, infections, or trauma  Cecal pneumatosis  Mildly elevated lactic acid- improved to 1.2  AFIB Chronic anticoagulation with INR 8.82 upon admission - now 1.32  Cardiomyopathy with LVEF 15-20%  ESRD on HD Tu/Th/Sa - Cr 8.96  Leukocytosis - 11.2  Anemia of CD - Hgb 9.8   Plan:  1. Conservative management - NPO, NG tube LIWS (861mL/24hr - but all clear yellow output), IVF, pain control, antiemetics  2. Serial exams/xrays/labs, KUB is unchanged this am.  Clinical exam is somewhat improved. 3. Quite unusual given no h/o abdominal trauma, infection, hernias, or surgeries that he would have a bowel obstruction. Unsure of etiology at this time. Seems to be not an ideal surgical candidate given heart and kidneys. Does not appear to be resolving.  Would ask nephrology and cardiology to weigh in on surgical intervention risks.  He would likely need exploratory laparotomy. 4. SCD's, hold coumadin, lovenox or heparin okay for DVT prophylaxis for now  5. Will follow. May need UGI with SBFT. Will need colonoscopy in 6 weeks from now once this resolves for further evaluation and to r/o cancer.     LOS: 4 days    Coralie Keens 02/17/2014, 7:47 AM Pager: [phone]

## 2014-02-17 NOTE — Progress Notes (Addendum)
He has c diff.  Will plan following while being treated and possibly avoid surgery as this resolves.  Ng lwis. Not sure what initial disease process was.  Is this c diff leading to other issues or as a result of abx now?  I think treating him for c diff in absence of urgent need to go to or is reasonable for now

## 2014-02-17 NOTE — Progress Notes (Signed)
PULMONARY / CRITICAL CARE MEDICINE  Name: RASHARD RYLE MRN: [id number] DOB: 1950-09-16    ADMISSION DATE:  02/13/2014  PRIMARY SERVICE: PCCM  CHIEF COMPLAINT:  SBO  BRIEF PATIENT DESCRIPTION:  63 yo with ESRD on HD, AF on Coumadin, cardiomyopathy with EF 20% transferred form [facility] where he was admitted for SBO and cecal pneumatosis.  SIGNIFICANT EVENTS / STUDIES:  6/19  CT abdomen >>> SBO with small area of pneumatosis at the tip of the cecum. Chronic right pleural effusion. Small pneumothorax.   LINES / TUBES: L IJ CVC 6/20 >>> NGT ??? >>>  CULTURES: 6/20  MRSA PCR >>> pos 6/20  Blood >>>  ANTIBIOTICS: Zosyn 6/20 >>> Vancomycin 6/20 >>>  INTERVAL HISTORY:  No blood per rectum last night.  Remains hemodynamically stable.  May need exploratory lap per Surgery.  VITAL SIGNS: Temp:  [97.4 F (36.3 C)-98 F (36.7 C)] 97.5 F (36.4 C) (06/23 0722) Pulse Rate:  [50-123] 106 (06/23 0700) Resp:  [16-27] 24 (06/23 0700) BP: (98-124)/(61-92) 116/75 mmHg (06/23 0700) SpO2:  [95 %-100 %] 100 % (06/23 0700) Weight:  [85.8 kg (189 lb 2.5 oz)] 85.8 kg (189 lb 2.5 oz) (06/23 0500)  HEMODYNAMICS:   VENTILATOR SETTINGS:   INTAKE / OUTPUT: Intake/Output     06/22 0701 - 06/23 0700 06/23 0701 - 06/24 0700   P.O. 360    I.V. (mL/kg) 255 (3)    NG/GT 90    IV Piggyback 150    Total Intake(mL/kg) 855 (10)    Emesis/NG output 800    Total Output 800     Net +55          Urine Occurrence 4 x    Stool Occurrence 4 x      PHYSICAL EXAMINATION: General:  Resting comfortable, wants NGT out Neuro:  Awake, alert HEENT:  NGT, moist membranes Cardiovascular:  Irregular, mildly tachycardic Lungs:  Bilateral diminished air entry, no added sounds Abdomen:  Soft, mild generalized tenderness, bowel sounds diminished Musculoskeletal:  R AKA, no edema Skin:  Intact  LABS:  CBC  Recent Labs Lab 02/15/14 0400 02/16/14 0400 02/17/14 0800  WBC 9.2 11.2* 12.0*  HGB 9.6* 9.8* 10.1*   HCT 29.7* 29.8* 30.7*  PLT 205 220 216   Coag's  Recent Labs Lab 02/15/14 0400 02/16/14 0400 02/17/14 0400  INR 1.32 1.53* 1.89*   BMET  Recent Labs Lab 02/15/14 0400 02/16/14 0400 02/17/14 0800  NA 140 139 139  K 4.9 5.3 5.8*  CL 95* 93* 90*  CO2 27 25 21   BUN 35* 58* 78*  CREATININE 6.63* 8.96* 10.97*  GLUCOSE 130* 121* 114*   Electrolytes  Recent Labs Lab 02/14/14 0324 02/15/14 0400 02/16/14 0400 02/17/14 0800  CALCIUM 10.0 10.2 10.2 10.2  MG 1.8 2.0 2.3  --   PHOS 4.9* 5.2* 6.4*  --    Sepsis Markers  Recent Labs Lab 02/13/14 2306 02/14/14 0324 02/15/14 0400  LATICACIDVEN 4.6* 2.8* 1.2   ABG  Recent Labs Lab 02/13/14 2235  PHART 7.406  PCO2ART 36.5  PO2ART 65.4*   Liver Enzymes  Recent Labs Lab 02/13/14 2001  AST 28  ALT 19  ALKPHOS 95  BILITOT 2.5*  ALBUMIN 2.7*   Cardiac Enzymes  Recent Labs Lab 02/13/14 2022  TROPONINI <0.30   Glucose  Recent Labs Lab 02/16/14 1131 02/16/14 1550 02/16/14 1959 02/16/14 2352 02/17/14 0349 02/17/14 0716  GLUCAP 116* 88 106* 107* 114* 109*   IMAGING:  Dg Abd 2  Views  02/16/2014   CLINICAL DATA:  Abdominal pain.  EXAM: ABDOMEN - 2 VIEW  COMPARISON:  02/15/2014.  FINDINGS: There is a nasogastric tube coiled in the stomach. There are multiple dilated loops of small bowel which measure up to 4.2 cm. No free intraperitoneal air identified.  IMPRESSION: 1. No change in high-grade small bowel obstruction pattern   Electronically Signed   By: Kerby Moors M.D.   On: 02/16/2014 09:25   Dg Abd Portable 1v  02/17/2014   CLINICAL DATA:  Small-bowel obstruction.  EXAM: PORTABLE ABDOMEN - 1 VIEW  COMPARISON:  02/16/2014.  FINDINGS: Persistent distention of small bowel loops noted. Bowel dilatation up to 4.2 cm again noted. No significant interval improvement. NG tube noted coiled in stomach. The stomach is nondistended. Paucity of colonic gas present. Aortoiliac atherosclerotic vascular disease.  Degenerative changes lumbar spine and both hips.  IMPRESSION: Persistent unchanged small-bowel obstruction. NG tube noted coiled in stomach. The stomach is nondistended .   Electronically Signed   By: Marcello Moores  Register   On: 02/17/2014 08:10   ASSESSMENT / PLAN:  PULMONARY A: Chronic R pleural effusion Possible aspiration pneumonia P:   Goal SpO2>92 Supplemental oxygen PRN May eventually need thoracentesis Albuterol PRN  CARDIOVASCULAR A:  Chronic hypotension ( SBP ~ 90 ) Chronic systolic heart failure AF, controlled rate Lactate reassuring P:  Goal SBP>90 Midodrine when able Hold preadmission Metoprolol, Diltiazem, Amiodarone  RENAL A:   ESRD on HD Hyperkalemia P:   Renal following HD Trend BMP  GASTROINTESTINAL A:   Ileus vs SBO Cecal pneumatosis, etiology unclear Possible GI hemorrhage Nutrition GERD on PPI P:   Surgery following NPO NGT to Sx May need TNA Protonix  Likely needs exploratory lap soon, will keep in ICU considering plans / comorbidities Will need repeated colonoscopy, may need EGD  HEMATOLOGIC A:   Chronic anticoagulation for AF Supratherapeutic INR on admission Acute on chronic anemia VTE Px P:  Hold Coumadin as surgical interventions may be required Hold Heparin IV as low risk and may have GI hemorrhage Heparin [place]  INFECTIOUS A:   Possible aspiration pneumonia Possible intraabdominal infection P:   Abx /cx as above  ENDOCRINE A:   DM P:   SSI  NEUROLOGIC A:   No active issues P:   No intervention required  I have personally obtained history, examined patient, evaluated and interpreted laboratory and imaging results, reviewed medical records, formulated assessment / plan and placed orders.  Doree Fudge, MD Pulmonary and [place] Pager: [phone]  02/17/2014, 9:01 AM

## 2014-02-17 NOTE — Progress Notes (Signed)
[facility] ROUNDING NOTE   Subjective:   Interval History: planning dialysis today . Surgery concerned about failure to resolve SBO  Objective:  Vital signs in last 24 hours:  Temp:  [97.4 F (36.3 C)-98 F (36.7 C)] 97.6 F (36.4 C) (06/23 1154) Pulse Rate:  [97-123] 106 (06/23 0700) Resp:  [16-27] 20 (06/23 1100) BP: (98-129)/(54-92) 104/61 mmHg (06/23 1100) SpO2:  [95 %-100 %] 100 % (06/23 0700) Weight:  [85.8 kg (189 lb 2.5 oz)] 85.8 kg (189 lb 2.5 oz) (06/23 0500)  Weight change: -1.1 kg (-2 lb 6.8 oz) Filed Weights   02/15/14 0400 02/16/14 0403 02/17/14 0500  Weight: 86.3 kg (190 lb 4.1 oz) 86.9 kg (191 lb 9.3 oz) 85.8 kg (189 lb 2.5 oz)    Intake/Output: I/O last 3 completed shifts: In: 1270 [P.O.:560; I.V.:390; NG/GT:120; IV Piggyback:200] Out: 1200 [Emesis/NG output:1200]   Intake/Output this shift:  Total I/O In: 80 [I.V.:30; IV Piggyback:50] Out: -   Chest clear bilat  Cor tachy, irregular, no MRG  Abd soft, NTND, +BS  R BKA, no LE edema  Neuro is nf, Ox 3  R forearm AVF +bruit    Basic Metabolic Panel:  Recent Labs Lab 02/13/14 2001 02/14/14 0324 02/15/14 0400 02/16/14 0400 02/17/14 0800  NA 140 135* 140 139 139  K 5.7* 5.8* 4.9 5.3 5.8*  CL 91* 89* 95* 93* 90*  CO2 24 23 27 25 21   GLUCOSE 199* 214* 130* 121* 114*  BUN 54* 58* 35* 58* 78*  CREATININE 9.42* 9.63* 6.63* 8.96* 10.97*  CALCIUM 10.8* 10.0 10.2 10.2 10.2  MG  --  1.8 2.0 2.3  --   PHOS  --  4.9* 5.2* 6.4*  --     Liver Function Tests:  Recent Labs Lab 02/13/14 2001  AST 28  ALT 19  ALKPHOS 95  BILITOT 2.5*  PROT 8.2  ALBUMIN 2.7*    Recent Labs Lab 02/13/14 2022  LIPASE 39   No results found for this basename: AMMONIA,  in the last 168 hours  CBC:  Recent Labs Lab 02/13/14 2001 02/14/14 0324 02/15/14 0400 02/16/14 0400 02/17/14 0800  WBC 9.7 10.8* 9.2 11.2* 12.0*  NEUTROABS 7.3  --   --   --   --   HGB 13.1 10.6* 9.6* 9.8* 10.1*   HCT 40.3 32.3* 29.7* 29.8* 30.7*  MCV 92.6 90.2 90.8 91.1 89.0  PLT 239 224 205 220 216    Cardiac Enzymes:  Recent Labs Lab 02/13/14 2022  TROPONINI <0.30    BNP: No components found with this basename: POCBNP,   CBG:  Recent Labs Lab 02/16/14 1550 02/16/14 1959 02/16/14 2352 02/17/14 0349 02/17/14 0716  GLUCAP 88 106* 107* 114* 109*    Microbiology: Results for orders placed during the hospital encounter of 02/13/14  MRSA PCR SCREENING     Status: Abnormal   Collection Time    02/14/14 12:48 AM      Result Value Ref Range Status   MRSA by PCR POSITIVE (*) NEGATIVE Final   Comment:            The GeneXpert MRSA Assay (FDA     approved for NASAL specimens     only), is one component of a     comprehensive MRSA colonization     surveillance program. It is not     intended to diagnose MRSA     infection nor to guide or     monitor treatment for  MRSA infections.     RESULT CALLED TO, READ BACK BY AND VERIFIED WITH:     NOTIFIED A CURRIN,RN 02/14/14 0222 BY RHOLMES  CULTURE, BLOOD (ROUTINE X 2)     Status: None   Collection Time    02/14/14  2:54 AM      Result Value Ref Range Status   Specimen Description BLOOD LEFT ARM   Final   Special Requests BOTTLES DRAWN AEROBIC ONLY 5CC   Final   Culture  Setup Time     Final   Value: 02/14/2014 11:29     Performed at [facility]   Culture     Final   Value:        BLOOD CULTURE RECEIVED NO GROWTH TO DATE CULTURE WILL BE HELD FOR 5 DAYS BEFORE ISSUING A FINAL NEGATIVE REPORT     Performed at [facility]   Report Status PENDING   Incomplete  CULTURE, BLOOD (ROUTINE X 2)     Status: None   Collection Time    02/14/14  3:00 AM      Result Value Ref Range Status   Specimen Description BLOOD LEFT ARM   Final   Special Requests BOTTLES DRAWN AEROBIC ONLY 3CC   Final   Culture  Setup Time     Final   Value: 02/14/2014 11:29     Performed at [facility]   Culture     Final   Value:         BLOOD CULTURE RECEIVED NO GROWTH TO DATE CULTURE WILL BE HELD FOR 5 DAYS BEFORE ISSUING A FINAL NEGATIVE REPORT     Performed at [facility]   Report Status PENDING   Incomplete  CULTURE, EXPECTORATED SPUTUM-ASSESSMENT     Status: None   Collection Time    02/14/14  9:30 AM      Result Value Ref Range Status   Specimen Description SPUTUM   Final   Special Requests NONE   Final   Sputum evaluation     Final   Value: MICROSCOPIC FINDINGS SUGGEST THAT THIS SPECIMEN IS NOT REPRESENTATIVE OF LOWER RESPIRATORY SECRETIONS. PLEASE RECOLLECT.     CALLED TO Barnet Pall RN 02/14/14 [place]   Report Status 02/14/2014 FINAL   Final  CLOSTRIDIUM DIFFICILE BY PCR     Status: Abnormal   Collection Time    02/17/14  8:43 AM      Result Value Ref Range Status   C difficile by pcr POSITIVE (*) NEGATIVE Final   Comment: CRITICAL RESULT CALLED TO, READ BACK BY AND VERIFIED WITH:     Johna Sheriff RN 11:20 02/17/14 ([email or web address])    Coagulation Studies:  Recent Labs  02/15/14 0400 02/16/14 0400 02/17/14 0400  LABPROT 16.1* 18.0* 21.1*  INR 1.32 1.53* 1.89*    Urinalysis: No results found for this basename: COLORURINE, APPERANCEUR, LABSPEC, PHURINE, GLUCOSEU, HGBUR, BILIRUBINUR, KETONESUR, PROTEINUR, UROBILINOGEN, NITRITE, LEUKOCYTESUR,  in the last 72 hours    Imaging: Dg Abd 2 Views  02/16/2014   CLINICAL DATA:  Abdominal pain.  EXAM: ABDOMEN - 2 VIEW  COMPARISON:  02/15/2014.  FINDINGS: There is a nasogastric tube coiled in the stomach. There are multiple dilated loops of small bowel which measure up to 4.2 cm. No free intraperitoneal air identified.  IMPRESSION: 1. No change in high-grade small bowel obstruction pattern   Electronically Signed   By: Kerby Moors M.D.   On: 02/16/2014 09:25   Dg Abd Portable 1v  02/17/2014   CLINICAL DATA:  Small-bowel obstruction.  EXAM: PORTABLE ABDOMEN - 1 VIEW  COMPARISON:  02/16/2014.  FINDINGS: Persistent distention of small bowel loops noted.  Bowel dilatation up to 4.2 cm again noted. No significant interval improvement. NG tube noted coiled in stomach. The stomach is nondistended. Paucity of colonic gas present. Aortoiliac atherosclerotic vascular disease. Degenerative changes lumbar spine and both hips.  IMPRESSION: Persistent unchanged small-bowel obstruction. NG tube noted coiled in stomach. The stomach is nondistended .   Electronically Signed   By: Marcello Moores  Register   On: 02/17/2014 08:10     Medications:     . Chlorhexidine Gluconate Cloth  6 each Topical Q0600  . heparin subcutaneous  5,000 Units Subcutaneous 3 times per day  . insulin aspart  0-15 Units Subcutaneous 6 times per day  . mupirocin ointment  1 application Nasal BID  . pantoprazole (PROTONIX) IV  40 mg Intravenous QHS  . piperacillin-tazobactam (ZOSYN)  IV  2.25 g Intravenous Q8H  . sodium chloride  10-40 mL Intracatheter Q12H  . sodium chloride  3 mL Intravenous Q12H  . vancomycin  1,000 mg Intravenous Q T,Th,Sa-HD   sodium chloride, sodium chloride, sodium chloride, albuterol, feeding supplement (NEPRO CARB STEADY), heparin, lidocaine (PF), lidocaine-prilocaine, pentafluoroprop-tetrafluoroeth, sodium chloride, sodium chloride  Assessment/ Plan:  1.SBO- conservative Rx per surgery for now, NG drainage, on IV zosyn and vanco    Surgery considering laparotomy 2 Chronic R pleural effusion / bibasilar infiltrates- per CCM  3 ESRD HD TTS   K 5.8 today 4 Afib - meds on hold, HR 110  5 CM EF 15%  6 DM 2 on insulin  7 HTPH cont vit D, po meds on hold  8 Anemia Hb 10.1  9 R BKA   Plan dialysis today   LOS: 4 [name] [name] W @TODAY @12 :06 PM

## 2014-02-18 DIAGNOSIS — I5022 Chronic systolic (congestive) heart failure: Secondary | ICD-10-CM

## 2014-02-18 DIAGNOSIS — A0472 Enterocolitis due to Clostridium difficile, not specified as recurrent: Secondary | ICD-10-CM

## 2014-02-18 DIAGNOSIS — D638 Anemia in other chronic diseases classified elsewhere: Secondary | ICD-10-CM

## 2014-02-18 DIAGNOSIS — I509 Heart failure, unspecified: Secondary | ICD-10-CM

## 2014-02-18 LAB — GLUCOSE, CAPILLARY
GLUCOSE-CAPILLARY: 91 mg/dL (ref 70–99)
Glucose-Capillary: 105 mg/dL — ABNORMAL HIGH (ref 70–99)
Glucose-Capillary: 111 mg/dL — ABNORMAL HIGH (ref 70–99)
Glucose-Capillary: 88 mg/dL (ref 70–99)
Glucose-Capillary: 89 mg/dL (ref 70–99)
Glucose-Capillary: 97 mg/dL (ref 70–99)

## 2014-02-18 LAB — CBC
HCT: 29.7 % — ABNORMAL LOW (ref 39.0–52.0)
Hemoglobin: 9.6 g/dL — ABNORMAL LOW (ref 13.0–17.0)
MCH: 29.3 pg (ref 26.0–34.0)
MCHC: 32.3 g/dL (ref 30.0–36.0)
MCV: 90.5 fL (ref 78.0–100.0)
PLATELETS: 222 10*3/uL (ref 150–400)
RBC: 3.28 MIL/uL — ABNORMAL LOW (ref 4.22–5.81)
RDW: 16 % — ABNORMAL HIGH (ref 11.5–15.5)
WBC: 10.2 10*3/uL (ref 4.0–10.5)

## 2014-02-18 LAB — PROTIME-INR
INR: 1.96 — ABNORMAL HIGH (ref 0.00–1.49)
Prothrombin Time: 21.7 seconds — ABNORMAL HIGH (ref 11.6–15.2)

## 2014-02-18 MED ORDER — WARFARIN SODIUM 2.5 MG PO TABS
2.5000 mg | ORAL_TABLET | Freq: Once | ORAL | Status: AC
  Administered 2014-02-18: 2.5 mg via ORAL
  Filled 2014-02-18: qty 1

## 2014-02-18 MED ORDER — FAMOTIDINE IN NACL 20-0.9 MG/50ML-% IV SOLN
20.0000 mg | INTRAVENOUS | Status: DC
  Administered 2014-02-18 – 2014-02-21 (×4): 20 mg via INTRAVENOUS
  Filled 2014-02-18 (×5): qty 50

## 2014-02-18 MED ORDER — ASPIRIN EC 81 MG PO TBEC
81.0000 mg | DELAYED_RELEASE_TABLET | Freq: Every evening | ORAL | Status: DC
  Administered 2014-02-18 – 2014-02-20 (×3): 81 mg via ORAL
  Filled 2014-02-18 (×4): qty 1

## 2014-02-18 MED ORDER — METOPROLOL TARTRATE 12.5 MG HALF TABLET
12.5000 mg | ORAL_TABLET | Freq: Two times a day (BID) | ORAL | Status: DC
  Filled 2014-02-18 (×2): qty 1

## 2014-02-18 MED ORDER — METOPROLOL TARTRATE 25 MG PO TABS
25.0000 mg | ORAL_TABLET | Freq: Two times a day (BID) | ORAL | Status: DC
  Administered 2014-02-18 – 2014-02-20 (×5): 25 mg via ORAL
  Filled 2014-02-18 (×10): qty 1

## 2014-02-18 MED ORDER — MIDODRINE HCL 5 MG PO TABS
5.0000 mg | ORAL_TABLET | Freq: Three times a day (TID) | ORAL | Status: DC
  Administered 2014-02-18 – 2014-02-21 (×10): 5 mg via ORAL
  Filled 2014-02-18 (×12): qty 1

## 2014-02-18 MED ORDER — WARFARIN - PHARMACIST DOSING INPATIENT
Freq: Every day | Status: DC
  Administered 2014-02-18 – 2014-02-19 (×2)

## 2014-02-18 MED ORDER — SODIUM CHLORIDE 0.9 % IV BOLUS (SEPSIS)
500.0000 mL | Freq: Once | INTRAVENOUS | Status: AC
  Administered 2014-02-18: 500 mL via INTRAVENOUS

## 2014-02-18 MED ORDER — AMIODARONE HCL 100 MG PO TABS
100.0000 mg | ORAL_TABLET | Freq: Every day | ORAL | Status: DC
  Administered 2014-02-18 – 2014-02-21 (×4): 100 mg via ORAL
  Filled 2014-02-18 (×4): qty 1

## 2014-02-18 MED ORDER — ATORVASTATIN CALCIUM 20 MG PO TABS
20.0000 mg | ORAL_TABLET | Freq: Every evening | ORAL | Status: DC
  Administered 2014-02-18 – 2014-02-20 (×3): 20 mg via ORAL
  Filled 2014-02-18 (×4): qty 1

## 2014-02-18 NOTE — Progress Notes (Signed)
[facility] ROUNDING NOTE   Subjective:   Interval History: improved  Questioning C Diff and ileus   Vital signs in last 24 hours:  Temp:  [97.5 F (36.4 C)-98.8 F (37.1 C)] 98 F (36.7 C) (06/24 1143) Pulse Rate:  [79-139] 102 (06/24 1200) Resp:  [20-31] 26 (06/24 1300) BP: (87-126)/(45-85) 104/57 mmHg (06/24 1300) SpO2:  [93 %-100 %] 100 % (06/24 1300) Weight:  [84.2 kg (185 lb 10 oz)-86.6 kg (190 lb 14.7 oz)] 84.2 kg (185 lb 10 oz) (06/24 0500)  Weight change: 0.8 kg (1 lb 12.2 oz) Filed Weights   02/17/14 1336 02/17/14 1736 02/18/14 0500  Weight: 86.6 kg (190 lb 14.7 oz) 85.3 kg (188 lb 0.8 oz) 84.2 kg (185 lb 10 oz)    Intake/Output: I/O last 3 completed shifts: In: 1670.5 [P.O.:240; I.V.:300.5; NG/GT:30; IV Piggyback:1100] Out: 1000 [Emesis/NG output:200; Other:800]   Intake/Output this shift:  Total I/O In: 150 [IV Piggyback:150] Out: -   Chest clear bilat  Cor tachy, irregular, no MRG  Abd soft, NTND, +BS  R BKA, no LE edema  Neuro is nf, Ox 3  R forearm AVF +bruit    Basic Metabolic Panel:  Recent Labs Lab 02/13/14 2001 02/14/14 0324 02/15/14 0400 02/16/14 0400 02/17/14 0800  NA 140 135* 140 139 139  K 5.7* 5.8* 4.9 5.3 5.8*  CL 91* 89* 95* 93* 90*  CO2 24 23 27 25 21   GLUCOSE 199* 214* 130* 121* 114*  BUN 54* 58* 35* 58* 78*  CREATININE 9.42* 9.63* 6.63* 8.96* 10.97*  CALCIUM 10.8* 10.0 10.2 10.2 10.2  MG  --  1.8 2.0 2.3  --   PHOS  --  4.9* 5.2* 6.4*  --     Liver Function Tests:  Recent Labs Lab 02/13/14 2001  AST 28  ALT 19  ALKPHOS 95  BILITOT 2.5*  PROT 8.2  ALBUMIN 2.7*    Recent Labs Lab 02/13/14 2022  LIPASE 39   No results found for this basename: AMMONIA,  in the last 168 hours  CBC:  Recent Labs Lab 02/13/14 2001 02/14/14 0324 02/15/14 0400 02/16/14 0400 02/17/14 0800 02/18/14 0410  WBC 9.7 10.8* 9.2 11.2* 12.0* 10.2  NEUTROABS 7.3  --   --   --   --   --   HGB 13.1 10.6* 9.6* 9.8*  10.1* 9.6*  HCT 40.3 32.3* 29.7* 29.8* 30.7* 29.7*  MCV 92.6 90.2 90.8 91.1 89.0 90.5  PLT 239 224 205 220 216 222    Cardiac Enzymes:  Recent Labs Lab 02/13/14 2022  TROPONINI <0.30    BNP: No components found with this basename: POCBNP,   CBG:  Recent Labs Lab 02/17/14 1933 02/17/14 2335 02/18/14 0328 02/18/14 0747 02/18/14 1140  GLUCAP 88 94 91 111* 105*    Microbiology: Results for orders placed during the hospital encounter of 02/13/14  MRSA PCR SCREENING     Status: Abnormal   Collection Time    02/14/14 12:48 AM      Result Value Ref Range Status   MRSA by PCR POSITIVE (*) NEGATIVE Final   Comment:            The GeneXpert MRSA Assay (FDA     approved for NASAL specimens     only), is one component of a     comprehensive MRSA colonization     surveillance program. It is not     intended to diagnose MRSA     infection nor to  guide or     monitor treatment for     MRSA infections.     RESULT CALLED TO, READ BACK BY AND VERIFIED WITH:     NOTIFIED A CURRIN,RN 02/14/14 0222 BY RHOLMES  CULTURE, BLOOD (ROUTINE X 2)     Status: None   Collection Time    02/14/14  2:54 AM      Result Value Ref Range Status   Specimen Description BLOOD LEFT ARM   Final   Special Requests BOTTLES DRAWN AEROBIC ONLY 5CC   Final   Culture  Setup Time     Final   Value: 02/14/2014 11:29     Performed at [facility]   Culture     Final   Value:        BLOOD CULTURE RECEIVED NO GROWTH TO DATE CULTURE WILL BE HELD FOR 5 DAYS BEFORE ISSUING A FINAL NEGATIVE REPORT     Performed at [facility]   Report Status PENDING   Incomplete  CULTURE, BLOOD (ROUTINE X 2)     Status: None   Collection Time    02/14/14  3:00 AM      Result Value Ref Range Status   Specimen Description BLOOD LEFT ARM   Final   Special Requests BOTTLES DRAWN AEROBIC ONLY 3CC   Final   Culture  Setup Time     Final   Value: 02/14/2014 11:29     Performed at [facility]   Culture      Final   Value:        BLOOD CULTURE RECEIVED NO GROWTH TO DATE CULTURE WILL BE HELD FOR 5 DAYS BEFORE ISSUING A FINAL NEGATIVE REPORT     Performed at [facility]   Report Status PENDING   Incomplete  CULTURE, EXPECTORATED SPUTUM-ASSESSMENT     Status: None   Collection Time    02/14/14  9:30 AM      Result Value Ref Range Status   Specimen Description SPUTUM   Final   Special Requests NONE   Final   Sputum evaluation     Final   Value: MICROSCOPIC FINDINGS SUGGEST THAT THIS SPECIMEN IS NOT REPRESENTATIVE OF LOWER RESPIRATORY SECRETIONS. PLEASE RECOLLECT.     CALLED TO Barnet Pall RN 02/14/14 [place]   Report Status 02/14/2014 FINAL   Final  CLOSTRIDIUM DIFFICILE BY PCR     Status: Abnormal   Collection Time    02/17/14  8:43 AM      Result Value Ref Range Status   C difficile by pcr POSITIVE (*) NEGATIVE Final   Comment: CRITICAL RESULT CALLED TO, READ BACK BY AND VERIFIED WITH:     Johna Sheriff RN 11:20 02/17/14 ([email or web address])    Coagulation Studies:  Recent Labs  02/16/14 0400 02/17/14 0400 02/18/14 0410  LABPROT 18.0* 21.1* 21.7*  INR 1.53* 1.89* 1.96*    Urinalysis: No results found for this basename: COLORURINE, APPERANCEUR, LABSPEC, PHURINE, GLUCOSEU, HGBUR, BILIRUBINUR, KETONESUR, PROTEINUR, UROBILINOGEN, NITRITE, LEUKOCYTESUR,  in the last 72 hours    Imaging: Dg Abd Portable 1v  02/17/2014   CLINICAL DATA:  Small-bowel obstruction.  EXAM: PORTABLE ABDOMEN - 1 VIEW  COMPARISON:  02/16/2014.  FINDINGS: Persistent distention of small bowel loops noted. Bowel dilatation up to 4.2 cm again noted. No significant interval improvement. NG tube noted coiled in stomach. The stomach is nondistended. Paucity of colonic gas present. Aortoiliac atherosclerotic vascular disease. Degenerative changes lumbar spine and both hips.  IMPRESSION:  Persistent unchanged small-bowel obstruction. NG tube noted coiled in stomach. The stomach is nondistended .   Electronically Signed    By: Marcello Moores  Register   On: 02/17/2014 08:10     Medications:     . amiodarone  100 mg Oral Daily  . aspirin EC  81 mg Oral QPM  . atorvastatin  20 mg Oral QPM  . chlorhexidine  15 mL Mouth/Throat BID  . Chlorhexidine Gluconate Cloth  6 each Topical Q0600  . famotidine (PEPCID) IV  20 mg Intravenous Q24H  . heparin subcutaneous  5,000 Units Subcutaneous 3 times per day  . insulin aspart  0-15 Units Subcutaneous 6 times per day  . metoprolol tartrate  25 mg Oral BID  . metronidazole  500 mg Intravenous Q8H  . midodrine  5 mg Oral TID AC  . mupirocin ointment  1 application Nasal BID  . sodium chloride  10-40 mL Intracatheter Q12H  . sodium chloride  3 mL Intravenous Q12H  . warfarin  2.5 mg Oral ONCE-1800  . Warfarin - Pharmacist Dosing Inpatient   Does not apply q1800   sodium chloride, sodium chloride, sodium chloride, albuterol, feeding supplement (NEPRO CARB STEADY), heparin, lidocaine (PF), lidocaine-prilocaine, pentafluoroprop-tetrafluoroeth, sodium chloride, sodium chloride  Assessment/ Plan:  1.SBO- conservative Rx ? C Diff  2 Chronic R pleural effusion / bibasilar infiltrates- per CCM  3 ESRD HD TTS K 5.8 today  4 Afib - meds restarted 5 CM EF 15%  6 DM 2 on insulin  7 HTPH cont vit D 8 Anemia Hb 10.1  9 R BKA   Plan dialysis tomorrow      LOS: 5 WEBB,MARTIN W @TODAY @1 :21 PM

## 2014-02-18 NOTE — Progress Notes (Signed)
ANTICOAGULATION CONSULT NOTE - Initial Consult  Pharmacy Consult for warfarin Indication: atrial fibrillation  Allergies  Allergen Reactions  . Bacitracin Other (See Comments)    unknown  . Norvasc [Amlodipine Besylate] Other (See Comments)    unknown    Patient Measurements: Height: 6\' 4"  (193 cm) Weight: 185 lb 10 oz (84.2 kg) IBW/kg (Calculated) : 86.8  Vital Signs: Temp: 97.8 F (36.6 C) (06/24 0750) Temp src: Oral (06/24 0750) BP: 105/79 mmHg (06/24 1000) Pulse Rate: 105 (06/24 0800)  Labs:  Recent Labs  02/16/14 0400 02/17/14 0400 02/17/14 0800 02/18/14 0410  HGB 9.8*  --  10.1* 9.6*  HCT 29.8*  --  30.7* 29.7*  PLT 220  --  216 222  LABPROT 18.0* 21.1*  --  21.7*  INR 1.53* 1.89*  --  1.96*  CREATININE 8.96*  --  10.97*  --     Estimated Creatinine Clearance: 8 ml/min (by C-G formula based on Cr of 10.97).   Medical History: Past Medical History  Diagnosis Date  . UGI bleed 02/12/11    On anticoagulation; EGD w/snare polypectomy-multiple polypoid lesions antrum(benign), Chronic active gastritis (NEGATIVE H pylori)  . Anemia, chronic disease   . Sepsis due to enterococcus 02/11/11  . Atrial fibrillation     On coumadin  . Coronary atherosclerosis of native coronary artery     Multivessel status post CABG  . Cardiomyopathy     LVEF 40-45% 01/2011  . Type 2 diabetes mellitus   . Essential hypertension, benign   . Hyperlipemia   . Gout   . S/P patent foramen ovale closure   . ESRD on hemodialysis     Started dialysis sometime around 2008-2009.  Gets HD in [place] with Dr Hinda Lenis on a TTS schedule.  Runs 3.5 hours with dry wt 86.5 kg as of June 2015.  Had a L arm AVF which didn't work and has been using his R forearm AVF for several years.    . History of nuclear stress test 07/04/2010    dipyridamole; mod fixed inferolateral defect; non-diagnostic for ischemia; low risk scan   . Hyperbilirubinemia 08/10/2013    Medications:  Prescriptions prior  to admission  Medication Sig Dispense Refill  . allopurinol (ZYLOPRIM) 100 MG tablet Take 100 mg by mouth daily.       Marland Kitchen amiodarone (PACERONE) 100 MG tablet Take 100 mg by mouth daily.      Marland Kitchen aspirin EC 81 MG tablet Take 81 mg by mouth every evening.      Marland Kitchen atorvastatin (LIPITOR) 20 MG tablet Take 20 mg by mouth every evening.       . B Complex-C-Zn-Folic Acid (DIALYVITE/ZINC PO) Take by mouth 3 (three) times a week. Patient has dialysis on Tuesdays, Thursdays, and Saturdays      . diltiazem (CARDIZEM CD) 180 MG 24 hr capsule Take 1 capsule (180 mg total) by mouth daily.  30 capsule  1  . folic acid (FOLVITE) 1 MG tablet Take 1 mg by mouth daily.        . insulin glargine (LANTUS) 100 UNIT/ML injection Inject 10 Units into the skin at bedtime.      . metoprolol tartrate (LOPRESSOR) 25 MG tablet Take 1 tablet (25 mg total) by mouth 3 (three) times daily.      . pantoprazole (PROTONIX) 40 MG tablet Take 40 mg by mouth 2 (two) times daily.       Marland Kitchen RENVELA 800 MG tablet Take 800 mg by mouth  3 (three) times daily with meals.       . SENSIPAR 30 MG tablet Take 30 mg by mouth daily.      . traMADol (ULTRAM) 50 MG tablet Take by mouth every 8 (eight) hours as needed for moderate pain.      . traZODone (DESYREL) 50 MG tablet Take 1 tablet (50 mg total) by mouth at bedtime.  30 tablet  0  . warfarin (COUMADIN) 5 MG tablet Take 2.5-5 mg by mouth See admin instructions. TAKES ONE HALF TABLET EVERY DAY EXCEPT TAKE ONE-HALF TABLET ON WEDNESDAYS      . ciprofloxacin (CIPRO) 500 MG tablet Take 500 mg by mouth daily.        Assessment: 37 yom admitted with SBO on chronic coumadin for afib. INR initially elevated and reversed with vitamin K. Today INR is slightly below goal at 1.96, H/H is low but stable and plts are WNL. No bleeding noted. Pt is also on SQ heparin for VTE prophylaxis while INR is subtherapeutic. Of note, pt is on multiple interacting meds including amiodarone (was on this PTA) and flagyl (new)  for Cdiff. Will need to monitor closely.   Goal of Therapy:  INR 2-3   Plan:  1. Warfarin 2.5mg  PO x 1 tonight 2. Daily INR 3. DC heparin SQ when INR>2  [name] [name], [name] [name] 02/18/2014,10:39 AM

## 2014-02-18 NOTE — Progress Notes (Signed)
Subjective: Feels better, ng has been clamped and no issues, had flatus and a loose stool  Objective: Vital signs in last 24 hours: Temp:  [97.5 F (36.4 C)-98.8 F (37.1 C)] 98.2 F (36.8 C) (06/24 0349) Pulse Rate:  [108-139] 116 (06/24 0700) Resp:  [18-31] 24 (06/24 0700) BP: (87-129)/(45-85) 92/76 mmHg (06/24 0700) SpO2:  [93 %-100 %] 100 % (06/24 0700) Weight:  [185 lb 10 oz (84.2 kg)-190 lb 14.7 oz (86.6 kg)] 185 lb 10 oz (84.2 kg) (06/24 0500) Last BM Date: 02/17/14  Intake/Output from previous day: 06/23 0701 - 06/24 0700 In: 1205.5 [I.V.:155.5; IV Piggyback:1050] Out: 800  Intake/Output this shift:   abd much softer, some bs, nontender  Lab Results:   Recent Labs  02/17/14 0800 02/18/14 0410  WBC 12.0* 10.2  HGB 10.1* 9.6*  HCT 30.7* 29.7*  PLT 216 222   BMET  Recent Labs  2014/03/06 0400 02/17/14 0800  NA 139 139  K 5.3 5.8*  CL 93* 90*  CO2 25 21  GLUCOSE 121* 114*  BUN 58* 78*  CREATININE 8.96* 10.97*  CALCIUM 10.2 10.2   PT/INR  Recent Labs  02/17/14 0400 02/18/14 0410  LABPROT 21.1* 21.7*  INR 1.89* 1.96*   ABG No results found for this basename: PHART, PCO2, PO2, HCO3,  in the last 72 hours  Studies/Results: Dg Abd 2 Views  03-06-2014   CLINICAL DATA:  Abdominal pain.  EXAM: ABDOMEN - 2 VIEW  COMPARISON:  02/15/2014.  FINDINGS: There is a nasogastric tube coiled in the stomach. There are multiple dilated loops of small bowel which measure up to 4.2 cm. No free intraperitoneal air identified.  IMPRESSION: 1. No change in high-grade small bowel obstruction pattern   Electronically Signed   By: Kerby Moors M.D.   On: 2014-03-06 09:25   Dg Abd Portable 1v  02/17/2014   CLINICAL DATA:  Small-bowel obstruction.  EXAM: PORTABLE ABDOMEN - 1 VIEW  COMPARISON:  03/06/2014.  FINDINGS: Persistent distention of small bowel loops noted. Bowel dilatation up to 4.2 cm again noted. No significant interval improvement. NG tube noted coiled in  stomach. The stomach is nondistended. Paucity of colonic gas present. Aortoiliac atherosclerotic vascular disease. Degenerative changes lumbar spine and both hips.  IMPRESSION: Persistent unchanged small-bowel obstruction. NG tube noted coiled in stomach. The stomach is nondistended .   Electronically Signed   By: Marcello Moores  Register   On: 02/17/2014 08:10    Anti-infectives: Anti-infectives   Start     Dose/Rate Route Frequency Ordered Stop   02/17/14 1400  metroNIDAZOLE (FLAGYL) IVPB 500 mg     500 mg 100 mL/hr over 60 Minutes Intravenous Every 8 hours 02/17/14 1310     02/17/14 1200  vancomycin (VANCOCIN) IVPB 1000 mg/200 mL premix     1,000 mg 200 mL/hr over 60 Minutes Intravenous Every T-Th-Sa (Hemodialysis) 2014-03-06 1007     02/14/14 1800  vancomycin (VANCOCIN) IVPB 1000 mg/200 mL premix     1,000 mg 200 mL/hr over 60 Minutes Intravenous  Once 02/14/14 1205 02/14/14 2232   02/14/14 0230  piperacillin-tazobactam (ZOSYN) IVPB 2.25 g     2.25 g 100 mL/hr over 30 Minutes Intravenous Every 8 hours 02/14/14 0219     02/13/14 2330  vancomycin (VANCOCIN) 1,500 mg in sodium chloride 0.9 % 500 mL IVPB     1,500 mg 250 mL/hr over 120 Minutes Intravenous  Once 02/13/14 2251 02/14/14 0124   02/13/14 2300  piperacillin-tazobactam (ZOSYN) IVPB 3.375 g  Status:  Discontinued     3.375 g 100 mL/hr over 30 Minutes Intravenous  Once 02/13/14 2250 02/14/14 0219   02/13/14 2259  vancomycin (VANCOCIN) 1 GM/200ML IVPB  Status:  Discontinued    Comments:  Eulis Manly   : cabinet override      02/13/14 2259 02/13/14 2304   02/13/14 2245  levofloxacin (LEVAQUIN) IVPB 750 mg     750 mg 100 mL/hr over 90 Minutes Intravenous  Once 02/13/14 2238 02/14/14 0022      Assessment/Plan: psbo c diff  He is much better today. I am still not sure what came first the sbo or the c diff but he is better with treatment. I think continuing treatment for c diff reasonable if he continues to improve.  Do not think he  needs surgery at this time. Would consider nutrition also Will dc ng today leave npo for now [facility] 02/18/2014

## 2014-02-18 NOTE — Progress Notes (Signed)
    Subjective:  Denies CP or dyspnea; abdominal pain improving   Objective:  Filed Vitals:   02/18/14 0600 02/18/14 0700 02/18/14 0750 02/18/14 0800  BP: 109/64 92/76    Pulse: 124 116    Temp:   97.8 F (36.6 C)   TempSrc:   Oral   Resp: 27 24    Height:      Weight:      SpO2: 100% 100%  100%    Intake/Output from previous day:  Intake/Output Summary (Last 24 hours) at 02/18/14 0848 Last data filed at 02/18/14 1962  Gross per 24 hour  Intake 1195.5 ml  Output    800 ml  Net  395.5 ml    Physical Exam: Physical exam: Well-developed well-nourished in no acute distress.  Skin is warm and dry.  HEENT is normal.  Neck is supple.  Chest with diminished BS RLL Cardiovascular exam is irregular and tachycardic Abdominal exam mildly distended with hypoactive BS Ext-s/p R AKA neuro grossly intact    Lab Results: Basic Metabolic Panel:  Recent Labs  02/16/14 0400 02/17/14 0800  NA 139 139  K 5.3 5.8*  CL 93* 90*  CO2 25 21  GLUCOSE 121* 114*  BUN 58* 78*  CREATININE 8.96* 10.97*  CALCIUM 10.2 10.2  MG 2.3  --   PHOS 6.4*  --    CBC:  Recent Labs  02/17/14 0800 02/18/14 0410  WBC 12.0* 10.2  HGB 10.1* 9.6*  HCT 30.7* 29.7*  MCV 89.0 90.5  PLT 216 222     Assessment/Plan:  1 atrial fibrillation-patient's rate is elevated. Increase metoprolol to 25 mg by mouth twice a day. Would resume Coumadin once it is clear no procedures will be required. Patient was in sinus rhythm in January. Given cardiomyopathy he may benefit from reestablishing sinus rhythm. Once he recovers from ongoing abdominal issues he will followup with Dr. Domenic Polite and consider outpatient cardioversion once therapeutic for 3 consecutive weeks. 2 coronary artery disease-resume statin at discharge. 3 history of mitral valve replacement 4 Clostridium difficile colitis-continue Flagyl. 5 small bowel obstruction-management per general surgery. 6 end-stage renal disease-dialysis per  nephrology. 7 cardiomyopathy-continue beta blocker. Blood pressure will not tolerate ACE inhibitor.  Kirk Ruths 02/18/2014, 8:48 AM

## 2014-02-18 NOTE — Progress Notes (Signed)
PULMONARY / CRITICAL CARE MEDICINE  Name: [name] [name] MRN: [id number] DOB: [birth year]    ADMISSION DATE:  02/13/2014  PRIMARY SERVICE: PCCM  CHIEF COMPLAINT:  SBO  BRIEF PATIENT DESCRIPTION:  63 yo with ESRD on HD, AF on Coumadin, cardiomyopathy with EF 20% transferred form [facility] where he was admitted for SBO and cecal pneumatosis.  SIGNIFICANT EVENTS / STUDIES:  6/19  CT abdomen >>> SBO with small area of pneumatosis at the tip of the cecum. Chronic right pleural effusion. Small pneumothorax.   LINES / TUBES: L IJ CVC 6/20 >>> NGT ??? >>> 6/23  CULTURES: 6/20  MRSA PCR >>> pos 6/20  Blood >>> 6/23  C.Diff PCR >>> pos  ANTIBIOTICS: Zosyn 6/20 >>> 6/24 Vancomycin 6/20 >>> 6/24 Flagyl 6/13 >>>  INTERVAL HISTORY:  Tachycardia / hypotension overnight  VITAL SIGNS: Temp:  [97.5 F (36.4 C)-98.8 F (37.1 C)] 97.8 F (36.6 C) (06/24 0750) Pulse Rate:  [108-139] 116 (06/24 0700) Resp:  [20-31] 24 (06/24 0700) BP: (87-129)/(45-85) 92/76 mmHg (06/24 0700) SpO2:  [93 %-100 %] 100 % (06/24 0700) Weight:  [84.2 kg (185 lb 10 oz)-86.6 kg (190 lb 14.7 oz)] 84.2 kg (185 lb 10 oz) (06/24 0500)  HEMODYNAMICS:   VENTILATOR SETTINGS:   INTAKE / OUTPUT: Intake/Output     06/23 0701 - 06/24 0700 06/24 0701 - 06/25 0700   P.O.     I.V. (mL/kg) 155.5 (1.8)    NG/GT     IV Piggyback 1050    Total Intake(mL/kg) 1205.5 (14.3)    Emesis/NG output     Other 800    Total Output 800     Net +405.5          Stool Occurrence 2 x      PHYSICAL EXAMINATION: General:  No distress Neuro:  Awake, alert HEENT:  NGT has been removed Cardiovascular:  Irregular, mildly tachycardic Lungs:  Bilateral diminished air entry Abdomen:  Soft, mild generalized tenderness, bowel sounds diminished Musculoskeletal:  R AKA, no edema Skin:  Intact  LABS:  CBC  Recent Labs Lab 02/16/14 0400 02/17/14 0800 02/18/14 0410  WBC 11.2* 12.0* 10.2  HGB 9.8* 10.1* 9.6*  HCT 29.8* 30.7* 29.7*  PLT 220  216 222   Coag's  Recent Labs Lab 02/16/14 0400 02/17/14 0400 02/18/14 0410  INR 1.53* 1.89* 1.96*   BMET  Recent Labs Lab 02/15/14 0400 02/16/14 0400 02/17/14 0800  NA 140 139 139  K 4.9 5.3 5.8*  CL 95* 93* 90*  CO2 27 25 21   BUN 35* 58* 78*  CREATININE 6.63* 8.96* 10.97*  GLUCOSE 130* 121* 114*   Electrolytes  Recent Labs Lab 02/14/14 0324 02/15/14 0400 02/16/14 0400 02/17/14 0800  CALCIUM 10.0 10.2 10.2 10.2  MG 1.8 2.0 2.3  --   PHOS 4.9* 5.2* 6.4*  --    Sepsis Markers  Recent Labs Lab 02/13/14 2306 02/14/14 0324 02/15/14 0400  LATICACIDVEN 4.6* 2.8* 1.2   ABG  Recent Labs Lab 02/13/14 2235  PHART 7.406  PCO2ART 36.5  PO2ART 65.4*   Liver Enzymes  Recent Labs Lab 02/13/14 2001  AST 28  ALT 19  ALKPHOS 95  BILITOT 2.5*  ALBUMIN 2.7*   Cardiac Enzymes  Recent Labs Lab 02/13/14 2022  TROPONINI <0.30   Glucose  Recent Labs Lab 02/17/14 0716 02/17/14 1150 02/17/14 1839 02/17/14 1933 02/17/14 2335 02/18/14 0328  GLUCAP 109* 117* 81 88 94 91   IMAGING:  Dg Abd 2 Views  02/16/2014  CLINICAL DATA:  Abdominal pain.  EXAM: ABDOMEN - 2 VIEW  COMPARISON:  02/15/2014.  FINDINGS: There is a nasogastric tube coiled in the stomach. There are multiple dilated loops of small bowel which measure up to 4.2 cm. No free intraperitoneal air identified.  IMPRESSION: 1. No change in high-grade small bowel obstruction pattern   Electronically Signed   By: Kerby Moors M.D.   On: 02/16/2014 09:25   Dg Abd Portable 1v  02/17/2014   CLINICAL DATA:  Small-bowel obstruction.  EXAM: PORTABLE ABDOMEN - 1 VIEW  COMPARISON:  02/16/2014.  FINDINGS: Persistent distention of small bowel loops noted. Bowel dilatation up to 4.2 cm again noted. No significant interval improvement. NG tube noted coiled in stomach. The stomach is nondistended. Paucity of colonic gas present. Aortoiliac atherosclerotic vascular disease. Degenerative changes lumbar spine and  both hips.  IMPRESSION: Persistent unchanged small-bowel obstruction. NG tube noted coiled in stomach. The stomach is nondistended .   Electronically Signed   By: Marcello Moores  Register   On: 02/17/2014 08:10   ASSESSMENT / PLAN:  PULMONARY A: Chronic R pleural effusion Possible aspiration pneumonia P:   Goal SpO2>92 Supplemental oxygen PRN Would defer thoracentesis for now Albuterol PRN  CARDIOVASCULAR A:  Chronic hypotension ( SBP ~ 90 ) Chronic systolic heart failure (EF 20%) AF - RVR Lactate reassuring P:  Goal SBP>90 Restart ASA, Lipitor, Amiodarone and Metoprolol ( decreased dose ) as now able to take po Start Midodrine 5  Hold Diltiazem  RENAL A:   ESRD on HD Hyperkalemia P:   Renal following HD Trend BMP  GASTROINTESTINAL A:   Pseudomembranous colitis Cecal pneumatosis, etiology unclear Ileus vs SBO, improving Nutrition GERD P:   Surgery following NPO except Rx Consider nutrition 6/25 D/c Protonix as associated with C.Diff Start Pepcid   HEMATOLOGIC A:   Chronic anticoagulation for AF Supratherapeutic INR on admission Acute on chronic anemia VTE Px P:  Start Coumadin per pharmacy Heparin [place], will d/c when INR>2  INFECTIOUS A:   C. Dif colitis No clear evidence of infection otherwise P:   D/c Vancomycin / Zosyn Continue Flagyl  ENDOCRINE A:   DM P:   SSI  NEUROLOGIC A:   No active issues P:   No intervention required  I have personally obtained history, examined patient, evaluated and interpreted laboratory and imaging results, reviewed medical records, formulated assessment / plan and placed orders.  Doree Fudge, MD [facility] [place] Pager: [phone]  02/18/2014, 8:05 AM

## 2014-02-18 NOTE — Progress Notes (Signed)
eLink Physician-Brief Progress Note Patient Name: [name] [name] DOB: 14-May-1951 MRN: [id number]  Date of Service  02/18/2014   HPI/Events of Note   Pt tachycardic and hypotensive.  Had > 1 liter fluid removed with HD.   eICU Interventions  Will give NS bolus 500 ml and monitor.   Intervention Category Major Interventions: Other:  SOOD,VINEET 02/18/2014, 12:25 AM

## 2014-02-19 ENCOUNTER — Inpatient Hospital Stay (HOSPITAL_COMMUNITY): Payer: Medicare Other

## 2014-02-19 DIAGNOSIS — N186 End stage renal disease: Secondary | ICD-10-CM

## 2014-02-19 DIAGNOSIS — J90 Pleural effusion, not elsewhere classified: Secondary | ICD-10-CM

## 2014-02-19 DIAGNOSIS — I5022 Chronic systolic (congestive) heart failure: Secondary | ICD-10-CM

## 2014-02-19 LAB — GLUCOSE, CAPILLARY
GLUCOSE-CAPILLARY: 125 mg/dL — AB (ref 70–99)
Glucose-Capillary: 103 mg/dL — ABNORMAL HIGH (ref 70–99)
Glucose-Capillary: 189 mg/dL — ABNORMAL HIGH (ref 70–99)
Glucose-Capillary: 146 mg/dL — ABNORMAL HIGH (ref 70–99)

## 2014-02-19 LAB — RENAL FUNCTION PANEL
Albumin: 2.1 g/dL — ABNORMAL LOW (ref 3.5–5.2)
BUN: 53 mg/dL — AB (ref 6–23)
CALCIUM: 10 mg/dL (ref 8.4–10.5)
CO2: 22 meq/L (ref 19–32)
CREATININE: 8.47 mg/dL — AB (ref 0.50–1.35)
Chloride: 96 mEq/L (ref 96–112)
GFR calc Af Amer: 7 mL/min — ABNORMAL LOW (ref >90)
GFR calc non Af Amer: 6 mL/min — ABNORMAL LOW (ref >90)
GLUCOSE: 98 mg/dL (ref 70–99)
Phosphorus: 8.4 mg/dL — ABNORMAL HIGH (ref 2.3–4.6)
Potassium: 4.4 mEq/L (ref 3.7–5.3)
Sodium: 142 mEq/L (ref 137–147)

## 2014-02-19 LAB — PROTIME-INR
INR: 2.45 — ABNORMAL HIGH (ref 0.00–1.49)
Prothrombin Time: 26.6 seconds — ABNORMAL HIGH (ref 11.6–15.2)

## 2014-02-19 MED ORDER — LIDOCAINE-PRILOCAINE 2.5-2.5 % EX CREA
1.0000 "application " | TOPICAL_CREAM | CUTANEOUS | Status: DC | PRN
  Filled 2014-02-19: qty 5

## 2014-02-19 MED ORDER — PENTAFLUOROPROP-TETRAFLUOROETH EX AERO: 1.0000 "application " | INHALATION_SPRAY | CUTANEOUS | Status: DC | PRN

## 2014-02-19 MED ORDER — SODIUM CHLORIDE 0.9 % IV SOLN: 100.0000 mL | INTRAVENOUS | Status: DC | PRN

## 2014-02-19 MED ORDER — WARFARIN SODIUM 1 MG PO TABS
1.0000 mg | ORAL_TABLET | Freq: Once | ORAL | Status: AC
  Administered 2014-02-19: 1 mg via ORAL
  Filled 2014-02-19: qty 1

## 2014-02-19 MED ORDER — HEPARIN SODIUM (PORCINE) 1000 UNIT/ML DIALYSIS: 1000.0000 [IU] | INTRAMUSCULAR | Status: DC | PRN

## 2014-02-19 MED ORDER — NEPRO/CARBSTEADY PO LIQD
237.0000 mL | ORAL | Status: DC | PRN
  Filled 2014-02-19: qty 237

## 2014-02-19 MED ORDER — ALTEPLASE 2 MG IJ SOLR
2.0000 mg | Freq: Once | INTRAMUSCULAR | Status: DC | PRN
  Filled 2014-02-19: qty 2

## 2014-02-19 MED ORDER — BOOST / RESOURCE BREEZE PO LIQD
1.0000 | Freq: Three times a day (TID) | ORAL | Status: DC
  Administered 2014-02-19 – 2014-02-20 (×5): 1 via ORAL

## 2014-02-19 MED ORDER — LIDOCAINE HCL (PF) 1 % IJ SOLN: 5.0000 mL | INTRAMUSCULAR | Status: DC | PRN

## 2014-02-19 NOTE — Progress Notes (Signed)
Pt transferred from 2S to [phone] via wheelchair and nurse tech and RN. Pt transferred to bed using walker and 2 person minimal assist. Telemetry applied. NS at 10 cc/hr d/t intermittent IV therapy. Cardiac monitor applied. Pt denies pain. Orders reviewed. Continued to monitor. Manya Silvas, RN

## 2014-02-19 NOTE — Progress Notes (Signed)
Subjective: Feeling better. No CP, SOB, dizziness, abd pain.  Small BM this morning. Passing gas.  Objective: Vital signs in last 24 hours: Temp:  [97.3 F (36.3 C)-98 F (36.7 C)] 97.3 F (36.3 C) (06/25 0746) Pulse Rate:  [36-108] 108 (06/25 0830) Resp:  [17-28] 20 (06/25 0700) BP: (86-127)/(54-85) 110/70 mmHg (06/25 0830) SpO2:  [93 %-100 %] 95 % (06/25 0746) Weight:  [188 lb 4.4 oz (85.4 kg)-190 lb 4.1 oz (86.3 kg)] 190 lb 4.1 oz (86.3 kg) (06/25 0746) Last BM Date: 02/18/14  Intake/Output from previous day: 06/24 0701 - 06/25 0700 In: 350 [IV Piggyback:350] Out: -  Intake/Output this shift:    Medications Current Facility-Administered Medications  Medication Dose Route Frequency Provider Last Rate Last Dose  . 0.9 %  sodium chloride infusion  250 mL Intravenous PRN Carin Hock, MD   250 mL at 02/17/14 1700  . 0.9 %  sodium chloride infusion  100 mL Intravenous PRN Sherril Croon, MD      . 0.9 %  sodium chloride infusion  100 mL Intravenous PRN Sherril Croon, MD      . albuterol (PROVENTIL) (2.5 MG/3ML) 0.083% nebulizer solution 2.5 mg  2.5 mg Nebulization Q2H PRN Carin Hock, MD      . amiodarone (PACERONE) tablet 100 mg  100 mg Oral Daily Doree Fudge, MD   100 mg at 02/18/14 0934  . aspirin EC tablet 81 mg  81 mg Oral QPM Konstantin Zubelevitskiy, MD   81 mg at 02/18/14 1700  . atorvastatin (LIPITOR) tablet 20 mg  20 mg Oral QPM Doree Fudge, MD   20 mg at 02/18/14 1700  . chlorhexidine (PERIDEX) 0.12 % solution 15 mL  15 mL Mouth/Throat BID Doree Fudge, MD   15 mL at 02/18/14 2228  . famotidine (PEPCID) IVPB 20 mg  20 mg Intravenous Q24H Doree Fudge, MD   20 mg at 02/18/14 0900  . feeding supplement (NEPRO CARB STEADY) liquid 237 mL  237 mL Oral PRN Sherril Croon, MD      . heparin injection 1,000 Units  1,000 Units Dialysis PRN Sherril Croon, MD      . heparin injection 5,000 Units  5,000 Units  Subcutaneous 3 times per day Doree Fudge, MD   5,000 Units at 02/19/14 [phone]  . insulin aspart (novoLOG) injection 0-15 Units  0-15 Units Subcutaneous 6 times per day Doree Fudge, MD      . lidocaine (PF) (XYLOCAINE) 1 % injection 5 mL  5 mL Intradermal PRN Sherril Croon, MD      . lidocaine-prilocaine (EMLA) cream 1 application  1 application Topical PRN Sherril Croon, MD      . metoprolol tartrate (LOPRESSOR) tablet 25 mg  25 mg Oral BID [name], MD   25 mg at 02/18/14 2228  . metroNIDAZOLE (FLAGYL) IVPB 500 mg  500 mg Intravenous Q8H Konstantin Zubelevitskiy, MD   500 mg at 02/19/14 0150  . midodrine (PROAMATINE) tablet 5 mg  5 mg Oral TID AC Doree Fudge, MD   5 mg at 02/19/14 0714  . pentafluoroprop-tetrafluoroeth (GEBAUERS) aerosol 1 application  1 application Topical PRN Sherril Croon, MD      . sodium chloride 0.9 % injection 10-40 mL  10-40 mL Intracatheter Q12H Carin Hock, MD   10 mL at 02/18/14 2228  . sodium chloride 0.9 % injection 10-40 mL  10-40 mL Intracatheter PRN Carin Hock, MD  20 mL at 02/16/14 1552  . sodium chloride 0.9 % injection 3 mL  3 mL Intravenous Q12H Carin Hock, MD   3 mL at 02/18/14 2229  . sodium chloride 0.9 % injection 3 mL  3 mL Intravenous PRN Carin Hock, MD   3 mL at 02/16/14 2100  . Warfarin - Pharmacist Dosing Inpatient   Does not apply q1800 Rande Lawman Rumbarger, [facility]        PE: General appearance: alert, cooperative and no distress Lungs: clear to auscultation bilaterally Heart: irregularly irregular rhythm Extremities: No Left LEE Pulses: 1+ Skin: Warm and dry Neurologic: Grossly normal  Lab Results:   Recent Labs  02/17/14 0800 02/18/14 0410  WBC 12.0* 10.2  HGB 10.1* 9.6*  HCT 30.7* 29.7*  PLT 216 222   BMET  Recent Labs  02/17/14 0800 02/19/14 0415  NA 139 142  K 5.8* 4.4  CL 90* 96  CO2 21 22  GLUCOSE 114* 98  BUN 78* 53*  CREATININE  10.97* 8.47*  CALCIUM 10.2 10.0   PT/INR  Recent Labs  02/17/14 0400 02/18/14 0410 02/19/14 0445  LABPROT 21.1* 21.7* 26.6*  INR 1.89* 1.96* 2.45*     Assessment/Plan  Active Problems: 1 atrial fibrillation-patient's rate is is 110 on increased metoprolol.   Overall rate is stable.  On Amio 100mg  daily.  Bp ranging 86/66 - 132/73.  No room for titration.  Periodic PVCs.  Coumadin resumed and INR therapeutic.  Per Dr. Stanford Breed:  Patient was in sinus rhythm in January. Given cardiomyopathy he may benefit from reestablishing sinus rhythm. Followup with Dr. Domenic Polite and consider outpatient cardioversion once therapeutic for 3 consecutive weeks.  2 coronary artery disease-continue statin.  3 history of mitral valve replacement  4 Clostridium difficile colitis-continue Flagyl.  5 small bowel obstruction-management per general surgery.  Mildly improved bowel gas pattern, now compatible with partial small bowel obstruction. 6 end-stage renal disease-dialysis per nephrology.  7 cardiomyopathy- beta blocker. Blood pressure will not tolerate ACE inhibitor.    LOS: 6 days    HAGER, BRYAN PA-C 02/19/2014 8:44 AM  As above; patient seen and examined; no chest pain or dyspnea; abdominal pain in improving. Remains in atrial fibrillation; continue metoprolol, amiodarone, and coumadin. Consider DCCV once INR therapeutic for 3 consecutive weeks following resolution of Cdiff and SBO. We will sign off; please call with questions. Patient should FU with Dr Domenic Polite 4 weeks following DC. Kirk Ruths

## 2014-02-19 NOTE — Progress Notes (Signed)
ANTICOAGULATION CONSULT NOTE - Follow-up Consult  Pharmacy Consult for warfarin Indication: atrial fibrillation  Allergies  Allergen Reactions  . Bacitracin Other (See Comments)    unknown  . Norvasc [Amlodipine Besylate] Other (See Comments)    unknown    Patient Measurements: Height: 6\' 4"  (193 cm) Weight: 187 lb 13.3 oz (85.2 kg) IBW/kg (Calculated) : 86.8  Vital Signs: Temp: 97.4 F (36.3 C) (06/25 1146) Temp src: Oral (06/25 1146) BP: 106/76 mmHg (06/25 1146) Pulse Rate: 114 (06/25 1146)  Labs:  Recent Labs  02/17/14 0400 02/17/14 0800 02/18/14 0410 02/19/14 0415 02/19/14 0445  HGB  --  10.1* 9.6*  --   --   HCT  --  30.7* 29.7*  --   --   PLT  --  216 222  --   --   LABPROT 21.1*  --  21.7*  --  26.6*  INR 1.89*  --  1.96*  --  2.45*  CREATININE  --  10.97*  --  8.47*  --     Estimated Creatinine Clearance: 10.5 ml/min (by C-G formula based on Cr of 8.47).   Assessment: 37 yom admitted with SBO on chronic coumadin for afib. INR initially elevated and reversed with vitamin K. Today INR is now therapeutic but jumped up significantly. No new CBC today. No bleeding  Of note, pt is on multiple interacting meds including amiodarone (was on this PTA) and flagyl (new) for Cdiff. These may be contributing to the rapidly increasing INR.   Goal of Therapy:  INR 2-3   Plan:  1. Warfarin 1mg  PO x 1 tonight 2. Daily INR  Rumbarger, Rande Lawman 02/19/2014,12:21 PM

## 2014-02-19 NOTE — Progress Notes (Signed)
PULMONARY / CRITICAL CARE MEDICINE  Name: [name] [name] MRN: [id number] DOB: Jan 27, 1951    ADMISSION DATE:  02/13/2014  PRIMARY SERVICE: PCCM  CHIEF COMPLAINT:  SBO  BRIEF PATIENT DESCRIPTION:  63 yo with ESRD on HD, AF on Coumadin, cardiomyopathy with EF 20% transferred form [facility] where he was admitted for SBO and cecal pneumatosis.  SIGNIFICANT EVENTS / STUDIES:  6/19  CT abdomen >>> SBO with small area of pneumatosis at the tip of the cecum. Chronic right pleural effusion. Small pneumothorax.   LINES / TUBES: L IJ CVC 6/20 >>> NGT ??? >>> 6/23  CULTURES: 6/20  MRSA PCR >>> pos 6/20  Blood >>> 6/23  C.Diff PCR >>> pos  ANTIBIOTICS: Zosyn 6/20 >>> 6/24 Vancomycin 6/20 >>> 6/24 Flagyl 6/13 >>>  INTERVAL HISTORY:  No overnight issues.  VITAL SIGNS: Temp:  [97.3 F (36.3 C)-98 F (36.7 C)] 97.3 F (36.3 C) (06/25 0746) Pulse Rate:  [36-123] 123 (06/25 1130) Resp:  [17-28] 20 (06/25 0700) BP: (86-132)/(57-85) 100/72 mmHg (06/25 1130) SpO2:  [93 %-100 %] 95 % (06/25 0746) Weight:  [85.4 kg (188 lb 4.4 oz)-86.3 kg (190 lb 4.1 oz)] 86.3 kg (190 lb 4.1 oz) (06/25 0746)  HEMODYNAMICS:   VENTILATOR SETTINGS:   INTAKE / OUTPUT: Intake/Output     06/24 0701 - 06/25 0700 06/25 0701 - 06/26 0700   I.V. (mL/kg)     IV Piggyback 350    Total Intake(mL/kg) 350 (4.1)    Other     Total Output       Net +350            PHYSICAL EXAMINATION: General:  On HD, in no distress Neuro:  Awake, alert, cooperative HEENT:  Moist membranes, no JVD Cardiovascular:  Irregular, mildly tachycardic Lungs:  CTAB Abdomen:  Soft, no significant tenderness Musculoskeletal:  R AKA, no edema Skin:  Intact  LABS:  CBC  Recent Labs Lab 02/16/14 0400 02/17/14 0800 02/18/14 0410  WBC 11.2* 12.0* 10.2  HGB 9.8* 10.1* 9.6*  HCT 29.8* 30.7* 29.7*  PLT 220 216 222   Coag's  Recent Labs Lab 02/17/14 0400 02/18/14 0410 02/19/14 0445  INR 1.89* 1.96* 2.45*   BMET  Recent  Labs Lab 02/16/14 0400 02/17/14 0800 02/19/14 0415  NA 139 139 142  K 5.3 5.8* 4.4  CL 93* 90* 96  CO2 25 21 22   BUN 58* 78* 53*  CREATININE 8.96* 10.97* 8.47*  GLUCOSE 121* 114* 98   Electrolytes  Recent Labs Lab 02/14/14 0324 02/15/14 0400 02/16/14 0400 02/17/14 0800 02/19/14 0415  CALCIUM 10.0 10.2 10.2 10.2 10.0  MG 1.8 2.0 2.3  --   --   PHOS 4.9* 5.2* 6.4*  --  8.4*   Sepsis Markers  Recent Labs Lab 02/13/14 2306 02/14/14 0324 02/15/14 0400  LATICACIDVEN 4.6* 2.8* 1.2   ABG  Recent Labs Lab 02/13/14 2235  PHART 7.406  PCO2ART 36.5  PO2ART 65.4*   Liver Enzymes  Recent Labs Lab 02/13/14 2001 02/19/14 0415  AST 28  --   ALT 19  --   ALKPHOS 95  --   BILITOT 2.5*  --   ALBUMIN 2.7* 2.1*   Cardiac Enzymes  Recent Labs Lab 02/13/14 2022  TROPONINI <0.30   Glucose  Recent Labs Lab 02/18/14 0747 02/18/14 1140 02/18/14 1613 02/18/14 1933 02/18/14 2334 02/19/14 0337  GLUCAP 111* 105* 97 89 88 103*   IMAGING:  Dg Abd Portable 1v  02/19/2014   CLINICAL DATA:  63 year old male with pain and small bowel obstruction. Initial encounter. End-stage renal disease.  EXAM: PORTABLE ABDOMEN - 1 VIEW  COMPARISON:  02/17/2014 and earlier.  FINDINGS: Portable AP view at 0627 hrs. Dilated gas-filled small bowel loops throughout much of the abdomen are mildly decreased in caliber (33 mm now versus 43 mm 2 days ago). Increased gas in decompressed left colon.  Persistent opacification at the right lung base compatible with a combination of a fusion and airspace opacity.  IMPRESSION: 1. Mildly improved bowel gas pattern, now compatible with partial small bowel obstruction (previously high-grade). 2. Right pleural effusion.   Electronically Signed   By: Lars Pinks M.D.   On: 02/19/2014 08:13   ASSESSMENT / PLAN:  PULMONARY A: Chronic R pleural effusion and smaller left effusion in setting of CHF / ESRD Possible aspiration pneumonia P:   Goal  SpO2>92 Supplemental oxygen PRN Would defer thoracentesis for now Albuterol PRN  CARDIOVASCULAR A:  Chronic hypotension ( SBP ~ 90 ) Chronic systolic heart failure (EF 20%) AF - RVR Lactate reassuring P:  Goal SBP>90 ASA, Lipitor, Amiodarone, Metoprolol Midodrine 5  Hold Diltiazem  RENAL A:   ESRD on HD Hyperkalemia P:   Renal following HD Trend BMP  GASTROINTESTINAL A:   Pseudomembranous colitis Cecal pneumatosis, etiology unclear Ileus vs SBO, improving Nutrition GERD P:   Surgery following Clear liquids Pepcid   HEMATOLOGIC A:   Chronic anticoagulation for AF Supratherapeutic INR on admission Acute on chronic anemia VTE Px P:  Coumadin per pharmacy  INFECTIOUS A:   C. Dif colitis No clear evidence of infection otherwise P:   Flagyl  ENDOCRINE A:   DM P:   SSI  NEUROLOGIC A:   No active issues P:   No intervention required  Transfer to telemetry 6/25.  TRH to assume care 6/26.  I have personally obtained history, examined patient, evaluated and interpreted laboratory and imaging results, reviewed medical records, formulated assessment / plan and placed orders.  Doree Fudge, MD [facility] Pager: [phone]  02/19/2014, 11:54 AM

## 2014-02-19 NOTE — Progress Notes (Signed)
Report received at 2200. Care assumed. Lilyan Gilford, RN

## 2014-02-19 NOTE — Progress Notes (Signed)
Patient ID: [name] [name], male   DOB: 1951-08-19, 63 y.o.   MRN: [id number]    Subjective: Pt feels better.  He's hungry.  Passing stool and flatus.  No nausea with NGT out  Objective: Vital signs in last 24 hours: Temp:  [97.3 F (36.3 C)-98 F (36.7 C)] 97.3 F (36.3 C) (06/25 0746) Pulse Rate:  [36-108] 108 (06/25 0830) Resp:  [17-28] 20 (06/25 0700) BP: (86-127)/(54-85) 110/70 mmHg (06/25 0830) SpO2:  [93 %-100 %] 95 % (06/25 0746) Weight:  [188 lb 4.4 oz (85.4 kg)-190 lb 4.1 oz (86.3 kg)] 190 lb 4.1 oz (86.3 kg) (06/25 0746) Last BM Date: 02/18/14  Intake/Output from previous day: 06/24 0701 - 06/25 0700 In: 350 [IV Piggyback:350] Out: -  Intake/Output this shift:    PE: Abd: soft, NT, ND, +BS  Lab Results:   Recent Labs  02/17/14 0800 02/18/14 0410  WBC 12.0* 10.2  HGB 10.1* 9.6*  HCT 30.7* 29.7*  PLT 216 222   BMET  Recent Labs  02/17/14 0800 02/19/14 0415  NA 139 142  K 5.8* 4.4  CL 90* 96  CO2 21 22  GLUCOSE 114* 98  BUN 78* 53*  CREATININE 10.97* 8.47*  CALCIUM 10.2 10.0   PT/INR  Recent Labs  02/18/14 0410 02/19/14 0445  LABPROT 21.7* 26.6*  INR 1.96* 2.45*   CMP     Component Value Date/Time   NA 142 02/19/2014 0415   K 4.4 02/19/2014 0415   K 4.4 05/04/2011 1426   CL 96 02/19/2014 0415   CL 101 05/04/2011 1427   CO2 22 02/19/2014 0415   CO2 25 05/04/2011 1427   GLUCOSE 98 02/19/2014 0415   BUN 53* 02/19/2014 0415   CREATININE 8.47* 02/19/2014 0415   CALCIUM 10.0 02/19/2014 0415   CALCIUM 8.8 05/04/2011 1426   PROT 8.2 02/13/2014 2001   PROT 6.4 05/04/2011 1427   ALBUMIN 2.1* 02/19/2014 0415   AST 28 02/13/2014 2001   AST 22 05/04/2011 1427   ALT 19 02/13/2014 2001   ALKPHOS 95 02/13/2014 2001   BILITOT 2.5* 02/13/2014 2001   GFRNONAA 6* 02/19/2014 0415   GFRAA 7* 02/19/2014 0415   Lipase     Component Value Date/Time   LIPASE 39 02/13/2014 2022       Studies/Results: Dg Abd Portable 1v  02/19/2014   CLINICAL DATA:  63 year old male  with pain and small bowel obstruction. Initial encounter. End-stage renal disease.  EXAM: PORTABLE ABDOMEN - 1 VIEW  COMPARISON:  02/17/2014 and earlier.  FINDINGS: Portable AP view at 0627 hrs. Dilated gas-filled small bowel loops throughout much of the abdomen are mildly decreased in caliber (33 mm now versus 43 mm 2 days ago). Increased gas in decompressed left colon.  Persistent opacification at the right lung base compatible with a combination of a fusion and airspace opacity.  IMPRESSION: 1. Mildly improved bowel gas pattern, now compatible with partial small bowel obstruction (previously high-grade). 2. Right pleural effusion.   Electronically Signed   By: Lars Pinks M.D.   On: 02/19/2014 08:13    Anti-infectives: Anti-infectives   Start     Dose/Rate Route Frequency Ordered Stop   02/17/14 1400  metroNIDAZOLE (FLAGYL) IVPB 500 mg     500 mg 100 mL/hr over 60 Minutes Intravenous Every 8 hours 02/17/14 1310     02/17/14 1200  vancomycin (VANCOCIN) IVPB 1000 mg/200 mL premix  Status:  Discontinued     1,000 mg 200 mL/hr over 60  Minutes Intravenous Every T-Th-Sa (Hemodialysis) 02/16/14 1007 02/18/14 0807   02/14/14 1800  vancomycin (VANCOCIN) IVPB 1000 mg/200 mL premix     1,000 mg 200 mL/hr over 60 Minutes Intravenous  Once 02/14/14 1205 02/14/14 2232   02/14/14 0230  piperacillin-tazobactam (ZOSYN) IVPB 2.25 g  Status:  Discontinued     2.25 g 100 mL/hr over 30 Minutes Intravenous Every 8 hours 02/14/14 0219 02/18/14 0807   02/13/14 2330  vancomycin (VANCOCIN) 1,500 mg in sodium chloride 0.9 % 500 mL IVPB     1,500 mg 250 mL/hr over 120 Minutes Intravenous  Once 02/13/14 2251 02/14/14 0124   02/13/14 2300  piperacillin-tazobactam (ZOSYN) IVPB 3.375 g  Status:  Discontinued     3.375 g 100 mL/hr over 30 Minutes Intravenous  Once 02/13/14 2250 02/14/14 0219   02/13/14 2259  vancomycin (VANCOCIN) 1 GM/200ML IVPB  Status:  Discontinued    Comments:  Eulis Manly   : cabinet override       02/13/14 2259 02/13/14 2304   02/13/14 2245  levofloxacin (LEVAQUIN) IVPB 750 mg     750 mg 100 mL/hr over 90 Minutes Intravenous  Once 02/13/14 2238 02/14/14 0022       Assessment/Plan  1. PSBO 2. c diff colitis  Plan: 1. Patient doing well without his NGT.  Will let him try clear liquids today and see how he does.  His films do show improvement, but still with some small bowel distention, but less, and more air in his colon.   LOS: 6 days    OSBORNE,KELLY E 02/19/2014, 8:50 AM Pager: [phone]

## 2014-02-19 NOTE — Progress Notes (Signed)
Agree with above, continue flagyl and looks like this will all resolve

## 2014-02-19 NOTE — Procedures (Signed)
I have seen and examined this patient and agree with the plan of care . BP 100/50 no edema or dyspnea WEBB,MARTIN W 02/19/2014, 8:23 AM

## 2014-02-19 NOTE — Progress Notes (Signed)
NUTRITION FOLLOW UP  DOCUMENTATION CODES Per approved criteria  -Not Applicable   INTERVENTION: Resource Breeze po TID, each supplement provides 250 kcal and 9 grams of protein RD to follow for nutrition care plan  NUTRITION DIAGNOSIS: Increased nutrient needs related to ESRD on HD as evidenced by estimated nutrition needs, ongoing  Goal: Pt to meet >/= 90% of their estimated nutrition needs, progressing  Monitor:  PO & supplemental intake, nutrition support initiation, weight, labs, I/O's  ASSESSMENT: Pt with hx of ESRD on HD admitted with N/V and abd distention. Per CT pt with SBO with small area of pneumatosis at the tip of the cecum and right chronic pleural effusion. + C diff colitis.  Patient advanced to Clear Liquids this AM.  NGT out.  Passing stool and flatus.  Nephrology following for HD.  Nutrient needs increased given ESRD.  Will add oral nutrition supplements at this time.  Bibasilar atelectasis/ infiltrate and right pleural effusion -- pt will eventually need thoracentesis once INR is normal.  Height: Ht Readings from Last 1 Encounters:  02/14/14 6\' 4"  (1.93 m)    Weight: Wt Readings from Last 1 Encounters:  02/19/14 187 lb 13.3 oz (85.2 kg)    BMI:  25.1 kg/m2 -- adjusted for AKA  Estimated Nutritional Needs: Kcal: 2200-2400 Protein: 110-120 gm Fluid: >1.2 L/day  Skin: Intact  Diet Order: Clear Liquid   Intake/Output Summary (Last 24 hours) at 02/19/14 1218 Last data filed at 02/19/14 1146  Gross per 24 hour  Intake    200 ml  Output   1500 ml  Net  -1300 ml    Labs:   Recent Labs Lab 02/14/14 0324 02/15/14 0400 02/16/14 0400 02/17/14 0800 02/19/14 0415  NA 135* 140 139 139 142  K 5.8* 4.9 5.3 5.8* 4.4  CL 89* 95* 93* 90* 96  CO2 23 27 25 21 22   BUN 58* 35* 58* 78* 53*  CREATININE 9.63* 6.63* 8.96* 10.97* 8.47*  CALCIUM 10.0 10.2 10.2 10.2 10.0  MG 1.8 2.0 2.3  --   --   PHOS 4.9* 5.2* 6.4*  --  8.4*  GLUCOSE 214* 130* 121*  114* 98    CBG (last 3)   Recent Labs  02/18/14 1933 02/18/14 2334 02/19/14 0337  GLUCAP 89 88 103*   Lab Results  Component Value Date   HGBA1C 6.8* 08/05/2013   Scheduled Meds: . amiodarone  100 mg Oral Daily  . aspirin EC  81 mg Oral QPM  . atorvastatin  20 mg Oral QPM  . chlorhexidine  15 mL Mouth/Throat BID  . famotidine (PEPCID) IV  20 mg Intravenous Q24H  . insulin aspart  0-15 Units Subcutaneous 6 times per day  . metoprolol tartrate  25 mg Oral BID  . metronidazole  500 mg Intravenous Q8H  . midodrine  5 mg Oral TID AC  . sodium chloride  10-40 mL Intracatheter Q12H  . sodium chloride  3 mL Intravenous Q12H  . Warfarin - Pharmacist Dosing Inpatient   Does not apply q1800    Continuous Infusions:   Past Medical History  Diagnosis Date  . UGI bleed 02/12/11    On anticoagulation; EGD w/snare polypectomy-multiple polypoid lesions antrum(benign), Chronic active gastritis (NEGATIVE H pylori)  . Anemia, chronic disease   . Sepsis due to enterococcus 02/11/11  . Atrial fibrillation     On coumadin  . Coronary atherosclerosis of native coronary artery     Multivessel status post CABG  . Cardiomyopathy  LVEF 40-45% 01/2011  . Type 2 diabetes mellitus   . Essential hypertension, benign   . Hyperlipemia   . Gout   . S/P patent foramen ovale closure   . ESRD on hemodialysis     Started dialysis sometime around 2008-2009.  Gets HD in [place], [place] with Dr Hinda Lenis on a TTS schedule.  Runs 3.5 hours with dry wt 86.5 kg as of June 2015.  Had a L arm AVF which didn't work and has been using his R forearm AVF for several years.    . History of nuclear stress test 07/04/2010    dipyridamole; mod fixed inferolateral defect; non-diagnostic for ischemia; low risk scan   . Hyperbilirubinemia 08/10/2013    Past Surgical History  Procedure Laterality Date  . Mitral valve replacement  01/05/2007    Bioprosthetic 18mm Edwards precordial tissue (Dr. Servando Snare)  . Coronary artery  bypass graft  01/05/2007    LIMA-LAD, SVG-OM, seq. SVG-PDA, PLA-RCA  . Colonoscopy  06/23/2011    Dr. Oneida Alar: multiple sessile and pedunculated polyps, moderate diverticulosis, internal hemorrhoids, +ADENOMATOUS POLYPS, consider genetic testing, surveillance in October 2015   . Esophagogastroduodenoscopy  02/12/2011    CHRONIC ACTIVE GASTRITIS, NO H. pylori  . Transthoracic echocardiogram  11/2011    EF 25% w/ mod dilatation of LV & mod LVH; LA severely dilated; mod-severe dilation of RV with mod-severe decrease in RV function; mild MR & TR  . Cardioversion  04/19/2007    Dr. Marella Chimes  . Transthoracic echocardiogram  02/14/2011    EF 40-45%, mod conc LVH; ventricular septum with motion showing abnormal function, dyssynergy, paradox; mild AS; mild-mod MR stenosis; LA severely dilated; aptrial septum bowed from L to R with increased LA pressure   . Amputation Right 07/14/2013    Procedure: AMPUTATION DIGIT;  Surgeon: [name], MD;  Location: [facility];  Service: General;  Laterality: Right;  . Amputation Right 08/08/2013    Procedure: AMPUTATION BELOW KNEE;  Surgeon: [name], MD;  Location: [facility];  Service: General;  Laterality: Right;  . Stump revision Right 09/19/2013    Procedure: STUMP REVISION;  Surgeon: Scherry Ran, MD;  Location: [facility];  Service: General;  Laterality: Right;  . Amputation Right 10/17/2013    Procedure: AMPUTATION ABOVE KNEE;  Surgeon: [name], MD;  Location: [facility];  Service: General;  Laterality: Right;    Arthur Holms, RD, LDN Pager #: [phone] After-Hours Pager #: [phone]

## 2014-02-20 LAB — GLUCOSE, CAPILLARY
GLUCOSE-CAPILLARY: 175 mg/dL — AB (ref 70–99)
GLUCOSE-CAPILLARY: 159 mg/dL — AB (ref 70–99)
GLUCOSE-CAPILLARY: 128 mg/dL — AB (ref 70–99)
Glucose-Capillary: 112 mg/dL — ABNORMAL HIGH (ref 70–99)
Glucose-Capillary: 127 mg/dL — ABNORMAL HIGH (ref 70–99)
Glucose-Capillary: 183 mg/dL — ABNORMAL HIGH (ref 70–99)

## 2014-02-20 LAB — CULTURE, BLOOD (ROUTINE X 2)
Culture: NO GROWTH
Culture: NO GROWTH

## 2014-02-20 LAB — PROTIME-INR
INR: 3.01 — ABNORMAL HIGH (ref 0.00–1.49)
Prothrombin Time: 31.2 seconds — ABNORMAL HIGH (ref 11.6–15.2)

## 2014-02-20 MED ORDER — DARBEPOETIN ALFA-POLYSORBATE 100 MCG/0.5ML IJ SOLN
100.0000 ug | INTRAMUSCULAR | Status: DC
  Administered 2014-02-21: 100 ug via INTRAVENOUS
  Filled 2014-02-20: qty 0.5

## 2014-02-20 NOTE — Progress Notes (Signed)
[facility] PROGRESS NOTE  [name] [name] QPR:[id number] DOB: 27-Nov-1950 DOA: 02/13/2014 PCP: [name], MD  Assessment/Plan: 63 y/o male with PMH including ESRD on HD T/Th/Sat, A. Fib on coumadin, HTN, CHF, cardiomyopathy LVEF of 15 to 20%, DM2, h/o UGI bleed present with nausea, vomiting and abdominal distention found to have SBO and small area of cecal pneumatosis. Transferred from [facility] to [facility]  1. SBO; +c diff;  CT: Small bowel obstruction pattern with change in caliber from the  proximal small bowel to the distal small bowel with a poor progression oral contrast. No transition point identified.  pneumatosis of the tip of the cecum. -clinically improved, NG out; tolerating diet;  +BM; on flagyl; appreciate surgery input   2. Probable aspiration pneumonia,. Improved on IV atx; blood cultures neg;  3. c diff colitis, improved on flagyl;  4. Sepsis/sirs/ due to pneumonia, SBO present on admission;  -improving on atx, management as above;  5. ESRD on HD, appreciate nephrology input  6. A fib chronic, on AC/couamidn; On Amio 100mg  -cont coumadin, per cards: possible DCCV in 3 consecutive weeks following resolution of Cdiff and SBO 7. Coronary artery disease-continue statin.  8. CHF combined HF, chronic cardiomyopathy ; echo (11/2013): EF 15-20%. Severe global hypokinesis -cont beta blocker. coumadin; Blood pressure will not tolerate ACE inhibitor -History of mitral valve replacement   Possible d/c 24-48 hrs   Consultants:  Surgery, critical care, cardiology   SIGNIFICANT EVENTS / STUDIES:  6/19 CT abdomen >>> SBO with small area of pneumatosis at the tip of the cecum. Chronic right pleural effusion. Small pneumothorax.  LINES / TUBES:  L IJ CVC 6/20 >>>  NGT ??? >>> 6/23  CULTURES:  6/20 MRSA PCR >>> pos  6/20 Blood >>>  6/23 C.Diff PCR >>> pos  ANTIBIOTICS:  Zosyn 6/20 >>> 6/24  Vancomycin 6/20 >>> 6/24  Flagyl 6/13 >>>    Code Status: full Family  Communication:  D/w patient  (indicate person spoken with, relationship, and if by phone, the number) Disposition Plan: home 24-48 hrs    Consultants:  Surgery, critical care, cardiology   SIGNIFICANT EVENTS / STUDIES:  6/19 CT abdomen >>> SBO with small area of pneumatosis at the tip of the cecum. Chronic right pleural effusion. Small pneumothorax.  LINES / TUBES:  L IJ CVC 6/20 >>>  NGT ??? >>> 6/23  CULTURES:  6/20 MRSA PCR >>> pos  6/20 Blood >>>  6/23 C.Diff PCR >>> pos  ANTIBIOTICS:  Zosyn 6/20 >>> 6/24  Vancomycin 6/20 >>> 6/24  Flagyl 6/13 >>>   HPI/Subjective: alert  Objective: Filed Vitals:   02/20/14 0916  BP: 97/64  Pulse: 110  Temp: 97.5 F (36.4 C)  Resp: 18    Intake/Output Summary (Last 24 hours) at 02/20/14 0949 Last data filed at 02/20/14 0853  Gross per 24 hour  Intake    850 ml  Output   1500 ml  Net   -650 ml   Filed Weights   02/19/14 0500 02/19/14 0746 02/19/14 1146  Weight: 85.4 kg (188 lb 4.4 oz) 86.3 kg (190 lb 4.1 oz) 85.2 kg (187 lb 13.3 oz)    Exam:   General:  alert  Cardiovascular: s1,s2 rrr  Respiratory: R LL diminished AE  Abdomen: soft, nt,nd   Musculoskeletal: s/u amputee L   Data Reviewed: Basic Metabolic Panel:  Recent Labs Lab 02/14/14 0324 02/15/14 0400 02/16/14 0400 02/17/14 0800 02/19/14 0415  NA 135* 140 139 139 142  K  5.8* 4.9 5.3 5.8* 4.4  CL 89* 95* 93* 90* 96  CO2 23 27 25 21 22   GLUCOSE 214* 130* 121* 114* 98  BUN 58* 35* 58* 78* 53*  CREATININE 9.63* 6.63* 8.96* 10.97* 8.47*  CALCIUM 10.0 10.2 10.2 10.2 10.0  MG 1.8 2.0 2.3  --   --   PHOS 4.9* 5.2* 6.4*  --  8.4*   Liver Function Tests:  Recent Labs Lab 02/13/14 2001 02/19/14 0415  AST 28  --   ALT 19  --   ALKPHOS 95  --   BILITOT 2.5*  --   PROT 8.2  --   ALBUMIN 2.7* 2.1*    Recent Labs Lab 02/13/14 2022  LIPASE 39   No results found for this basename: AMMONIA,  in the last 168 hours CBC:  Recent Labs Lab  02/13/14 2001 02/14/14 0324 02/15/14 0400 02/16/14 0400 02/17/14 0800 02/18/14 0410  WBC 9.7 10.8* 9.2 11.2* 12.0* 10.2  NEUTROABS 7.3  --   --   --   --   --   HGB 13.1 10.6* 9.6* 9.8* 10.1* 9.6*  HCT 40.3 32.3* 29.7* 29.8* 30.7* 29.7*  MCV 92.6 90.2 90.8 91.1 89.0 90.5  PLT 239 224 205 220 216 222   Cardiac Enzymes:  Recent Labs Lab 02/13/14 2022  TROPONINI <0.30   BNP (last 3 results) No results found for this basename: PROBNP,  in the last 8760 hours CBG:  Recent Labs Lab 02/19/14 1617 02/19/14 1927 02/20/14 0003 02/20/14 0445 02/20/14 0750  GLUCAP 189* 146* 127* 159* 112*    Recent Results (from the past 240 hour(s))  MRSA PCR SCREENING     Status: Abnormal   Collection Time    02/14/14 12:48 AM      Result Value Ref Range Status   MRSA by PCR POSITIVE (*) NEGATIVE Final   Comment:            The GeneXpert MRSA Assay (FDA     approved for NASAL specimens     only), is one component of a     comprehensive MRSA colonization     surveillance program. It is not     intended to diagnose MRSA     infection nor to guide or     monitor treatment for     MRSA infections.     RESULT CALLED TO, READ BACK BY AND VERIFIED WITH:     NOTIFIED A CURRIN,RN 02/14/14 0222 BY RHOLMES  CULTURE, BLOOD (ROUTINE X 2)     Status: None   Collection Time    02/14/14  2:54 AM      Result Value Ref Range Status   Specimen Description BLOOD LEFT ARM   Final   Special Requests BOTTLES DRAWN AEROBIC ONLY 5CC   Final   Culture  Setup Time     Final   Value: 02/14/2014 11:29     Performed at [facility]   Culture     Final   Value: NO GROWTH 5 DAYS     Performed at [facility]   Report Status 02/20/2014 FINAL   Final  CULTURE, BLOOD (ROUTINE X 2)     Status: None   Collection Time    02/14/14  3:00 AM      Result Value Ref Range Status   Specimen Description BLOOD LEFT ARM   Final   Special Requests BOTTLES DRAWN AEROBIC ONLY 3CC   Final   Culture  Setup  Time     Final   Value: 02/14/2014 11:29     Performed at [facility]   Culture     Final   Value: NO GROWTH 5 DAYS     Performed at [facility]   Report Status 02/20/2014 FINAL   Final  CULTURE, EXPECTORATED SPUTUM-ASSESSMENT     Status: None   Collection Time    02/14/14  9:30 AM      Result Value Ref Range Status   Specimen Description SPUTUM   Final   Special Requests NONE   Final   Sputum evaluation     Final   Value: MICROSCOPIC FINDINGS SUGGEST THAT THIS SPECIMEN IS NOT REPRESENTATIVE OF LOWER RESPIRATORY SECRETIONS. PLEASE RECOLLECT.     CALLED TO Barnet Pall RN 02/14/14 [place]   Report Status 02/14/2014 FINAL   Final  CLOSTRIDIUM DIFFICILE BY PCR     Status: Abnormal   Collection Time    02/17/14  8:43 AM      Result Value Ref Range Status   C difficile by pcr POSITIVE (*) NEGATIVE Final   Comment: CRITICAL RESULT CALLED TO, READ BACK BY AND VERIFIED WITH:     Johna Sheriff RN 11:20 02/17/14 ([email or web address])     Studies: Dg Abd Portable 1v  02/19/2014   CLINICAL DATA:  63 year old male with pain and small bowel obstruction. Initial encounter. End-stage renal disease.  EXAM: PORTABLE ABDOMEN - 1 VIEW  COMPARISON:  02/17/2014 and earlier.  FINDINGS: Portable AP view at 0627 hrs. Dilated gas-filled small bowel loops throughout much of the abdomen are mildly decreased in caliber (33 mm now versus 43 mm 2 days ago). Increased gas in decompressed left colon.  Persistent opacification at the right lung base compatible with a combination of a fusion and airspace opacity.  IMPRESSION: 1. Mildly improved bowel gas pattern, now compatible with partial small bowel obstruction (previously high-grade). 2. Right pleural effusion.   Electronically Signed   By: Lars Pinks M.D.   On: 02/19/2014 08:13    Scheduled Meds: . amiodarone  100 mg Oral Daily  . aspirin EC  81 mg Oral QPM  . atorvastatin  20 mg Oral QPM  . famotidine (PEPCID) IV  20 mg Intravenous Q24H  . feeding  supplement (RESOURCE BREEZE)  1 Container Oral TID BM  . insulin aspart  0-15 Units Subcutaneous 6 times per day  . metoprolol tartrate  25 mg Oral BID  . metronidazole  500 mg Intravenous Q8H  . midodrine  5 mg Oral TID AC  . sodium chloride  10-40 mL Intracatheter Q12H  . sodium chloride  3 mL Intravenous Q12H  . Warfarin - Pharmacist Dosing Inpatient   Does not apply q1800   Continuous Infusions:   Active Problems:   SBO (small bowel obstruction)   Septic shock    Time spent: >35 minutes     Kinnie Feil  [facility] Pager [phone]. If 7PM-7AM, please contact night-coverage at [email or web address], password [facility] 02/20/2014, 9:49 AM  LOS: 7 days

## 2014-02-20 NOTE — Progress Notes (Signed)
Patient ID: [name] [name], male   DOB: July 10, 1951, 63 y.o.   MRN: [id number]    Subjective: Feels well this morning.  No nausea with clears.  Still passing flatus and BMs  Objective: Vital signs in last 24 hours: Temp:  [97.4 F (36.3 C)-99.1 F (37.3 C)] 97.5 F (36.4 C) (06/26 0916) Pulse Rate:  [46-123] 110 (06/26 0916) Resp:  [17-25] 18 (06/26 0916) BP: (95-112)/(48-76) 97/64 mmHg (06/26 0916) SpO2:  [91 %-95 %] 94 % (06/26 0916) Weight:  [187 lb 13.3 oz (85.2 kg)] 187 lb 13.3 oz (85.2 kg) (06/25 1146) Last BM Date: 02/19/14  Intake/Output from previous day: 06/25 0701 - 06/26 0700 In: 490 [P.O.:240; IV Piggyback:250] Out: 1500  Intake/Output this shift: Total I/O In: 360 [P.O.:360] Out: -   PE: Abd: soft, minimal bloating, +BS, NT  Lab Results:   Recent Labs  02/18/14 0410  WBC 10.2  HGB 9.6*  HCT 29.7*  PLT 222   BMET  Recent Labs  02/19/14 0415  NA 142  K 4.4  CL 96  CO2 22  GLUCOSE 98  BUN 53*  CREATININE 8.47*  CALCIUM 10.0   PT/INR  Recent Labs  02/19/14 0445 02/20/14 0555  LABPROT 26.6* 31.2*  INR 2.45* 3.01*   CMP     Component Value Date/Time   NA 142 02/19/2014 0415   K 4.4 02/19/2014 0415   K 4.4 05/04/2011 1426   CL 96 02/19/2014 0415   CL 101 05/04/2011 1427   CO2 22 02/19/2014 0415   CO2 25 05/04/2011 1427   GLUCOSE 98 02/19/2014 0415   BUN 53* 02/19/2014 0415   CREATININE 8.47* 02/19/2014 0415   CALCIUM 10.0 02/19/2014 0415   CALCIUM 8.8 05/04/2011 1426   PROT 8.2 02/13/2014 2001   PROT 6.4 05/04/2011 1427   ALBUMIN 2.1* 02/19/2014 0415   AST 28 02/13/2014 2001   AST 22 05/04/2011 1427   ALT 19 02/13/2014 2001   ALKPHOS 95 02/13/2014 2001   BILITOT 2.5* 02/13/2014 2001   GFRNONAA 6* 02/19/2014 0415   GFRAA 7* 02/19/2014 0415   Lipase     Component Value Date/Time   LIPASE 39 02/13/2014 2022       Studies/Results: Dg Abd Portable 1v  02/19/2014   CLINICAL DATA:  63 year old male with pain and small bowel obstruction. Initial  encounter. End-stage renal disease.  EXAM: PORTABLE ABDOMEN - 1 VIEW  COMPARISON:  02/17/2014 and earlier.  FINDINGS: Portable AP view at 0627 hrs. Dilated gas-filled small bowel loops throughout much of the abdomen are mildly decreased in caliber (33 mm now versus 43 mm 2 days ago). Increased gas in decompressed left colon.  Persistent opacification at the right lung base compatible with a combination of a fusion and airspace opacity.  IMPRESSION: 1. Mildly improved bowel gas pattern, now compatible with partial small bowel obstruction (previously high-grade). 2. Right pleural effusion.   Electronically Signed   By: Lars Pinks M.D.   On: 02/19/2014 08:13    Anti-infectives: Anti-infectives   Start     Dose/Rate Route Frequency Ordered Stop   02/17/14 1400  metroNIDAZOLE (FLAGYL) IVPB 500 mg     500 mg 100 mL/hr over 60 Minutes Intravenous Every 8 hours 02/17/14 1310     02/17/14 1200  vancomycin (VANCOCIN) IVPB 1000 mg/200 mL premix  Status:  Discontinued     1,000 mg 200 mL/hr over 60 Minutes Intravenous Every T-Th-Sa (Hemodialysis) 02/16/14 1007 02/18/14 0807   02/14/14 1800  vancomycin (  VANCOCIN) IVPB 1000 mg/200 mL premix     1,000 mg 200 mL/hr over 60 Minutes Intravenous  Once 02/14/14 1205 02/14/14 2232   02/14/14 0230  piperacillin-tazobactam (ZOSYN) IVPB 2.25 g  Status:  Discontinued     2.25 g 100 mL/hr over 30 Minutes Intravenous Every 8 hours 02/14/14 0219 02/18/14 0807   02/13/14 2330  vancomycin (VANCOCIN) 1,500 mg in sodium chloride 0.9 % 500 mL IVPB     1,500 mg 250 mL/hr over 120 Minutes Intravenous  Once 02/13/14 2251 02/19/14 1707   02/13/14 2300  piperacillin-tazobactam (ZOSYN) IVPB 3.375 g  Status:  Discontinued     3.375 g 100 mL/hr over 30 Minutes Intravenous  Once 02/13/14 2250 02/14/14 0219   02/13/14 2259  vancomycin (VANCOCIN) 1 GM/200ML IVPB  Status:  Discontinued    Comments:  Eulis Manly   : cabinet override      02/13/14 2259 02/13/14 2304   02/13/14 2245   levofloxacin (LEVAQUIN) IVPB 750 mg     750 mg 100 mL/hr over 90 Minutes Intravenous  Once 02/13/14 2238 02/19/14 1706       Assessment/Plan  1. SBO, improving 2. c diff colitis  Plan: 1. SBO continues to improve.  Will advance to full liquid diet. 2. Cont medical treatment of c diff colitis   LOS: 7 days    OSBORNE,KELLY E 02/20/2014, 10:32 AM Pager: [phone]

## 2014-02-20 NOTE — Progress Notes (Signed)
Agree with above, still not sure of initial etiology but he is clearly improving now

## 2014-02-20 NOTE — Progress Notes (Signed)
[name] [name] Progress Note  Subjective:   Resting in bed. No emerging complaints.  Objective Filed Vitals:   02/19/14 1710 02/19/14 2057 02/20/14 0452 02/20/14 0916  BP: 112/74 95/60 110/66 97/64  Pulse: 101 110 95 110  Temp: 98.6 F (37 C) 99.1 F (37.3 C) 98.5 F (36.9 C) 97.5 F (36.4 C)  TempSrc: Oral Oral Oral Oral  Resp: 21 18 18 18   Height:      Weight:      SpO2: 92% 91% 94% 94%   Physical Exam General: Alert, cooperative, NAD Heart: Tachy regular Lungs: CTA bilat Abdomen: Soft, NT, non-distended Extremities: Rt AKA, no LE edema on left Dialysis Access: Rt AVF + bruit  HD: [facility]  3.5h 87kg 2K/2.5 Bath Heparin none  Hectorol 1 ug TIW  BP usually runs in the 90's w/o symptoms per RN  Assessment/Plan: 1. SBO - Improving. Medical mgmt per surgery. NG out. Tolerating full liquids. 2. C. Diff Colitis - On Flagyl 3. Sepsis/ Possible aspiration PNA - Mgmt per primary. BC's negative. Off Abx 4. ESRD - TTS, K+4.4. HD tomorrow 5. Anemia - Hgb 9.6, trending down. Start Aranesp 100. Tsat 28% with Ferritin >1800. Follow labs. No IV Fe for now. Follow CBC. 6. Secondary hyperparathyroidism - Ca 10 (11.5 corrected). Phos 8.4. Not on binder pending resolution of #1. Holding hectorol, use low Ca bath - may need to continue outpt. 7. HTN/volume - SBPs 90s-110s. On midodrine and metoprolol. Consider lowering metop dose if not d/c all together. Under edw with chronic pleural eff. Try for 1-2L  8. Nutrition - Full liquids 9. A-fib - On amiodarone. Coumadin on hold for ^INR. Possible DCCV in 3 weeks following resolution of SBO and C. Diff per cardiology. 10. Combined CHF/ Hx MVR - EF 15-20%   [name] [name] [name] Pager [phone] 02/20/2014,2:31 PM  LOS: 7 days    Additional Objective Labs: Basic Metabolic Panel:  Recent Labs Lab 02/15/14 0400 02/16/14 0400 02/17/14 0800 02/19/14 0415  NA 140 139 139 142  K 4.9 5.3 5.8*  4.4  CL 95* 93* 90* 96  CO2 27 25 21 22   GLUCOSE 130* 121* 114* 98  BUN 35* 58* 78* 53*  CREATININE 6.63* 8.96* 10.97* 8.47*  CALCIUM 10.2 10.2 10.2 10.0  PHOS 5.2* 6.4*  --  8.4*   Liver Function Tests:  Recent Labs Lab 02/13/14 2001 02/19/14 0415  AST 28  --   ALT 19  --   ALKPHOS 95  --   BILITOT 2.5*  --   PROT 8.2  --   ALBUMIN 2.7* 2.1*    Recent Labs Lab 02/13/14 2022  LIPASE 39   CBC:  Recent Labs Lab 02/13/14 2001 02/14/14 0324 02/15/14 0400 02/16/14 0400 02/17/14 0800 02/18/14 0410  WBC 9.7 10.8* 9.2 11.2* 12.0* 10.2  NEUTROABS 7.3  --   --   --   --   --   HGB 13.1 10.6* 9.6* 9.8* 10.1* 9.6*  HCT 40.3 32.3* 29.7* 29.8* 30.7* 29.7*  MCV 92.6 90.2 90.8 91.1 89.0 90.5  PLT 239 224 205 220 216 222   Blood Culture    Component Value Date/Time   SDES SPUTUM 02/14/2014 0930   SPECREQUEST NONE 02/14/2014 0930   CULT  Value: NO GROWTH 5 DAYS Performed at [place] 02/14/2014 0300   REPTSTATUS 02/14/2014 FINAL 02/14/2014 0930    Cardiac Enzymes:  Recent Labs Lab 02/13/14 2022  TROPONINI <0.30  CBG:  Recent Labs Lab 02/19/14 1617 02/19/14 1927 02/20/14 0003 02/20/14 0445 02/20/14 0750  GLUCAP 189* 146* 127* 159* 112*    Studies/Results: Dg Abd Portable 1v  02/19/2014   CLINICAL DATA:  63 year old male with pain and small bowel obstruction. Initial encounter. End-stage renal disease.  EXAM: PORTABLE ABDOMEN - 1 VIEW  COMPARISON:  02/17/2014 and earlier.  FINDINGS: Portable AP view at 0627 hrs. Dilated gas-filled small bowel loops throughout much of the abdomen are mildly decreased in caliber (33 mm now versus 43 mm 2 days ago). Increased gas in decompressed left colon.  Persistent opacification at the right lung base compatible with a combination of a fusion and airspace opacity.  IMPRESSION: 1. Mildly improved bowel gas pattern, now compatible with partial small bowel obstruction (previously high-grade). 2. Right pleural effusion.    Electronically Signed   By: Lars Pinks M.D.   On: 02/19/2014 08:13   Medications:   . amiodarone  100 mg Oral Daily  . aspirin EC  81 mg Oral QPM  . atorvastatin  20 mg Oral QPM  . [START ON 02/21/2014] darbepoetin (ARANESP) injection - DIALYSIS  100 mcg Intravenous Q Sat-HD  . famotidine (PEPCID) IV  20 mg Intravenous Q24H  . feeding supplement (RESOURCE BREEZE)  1 Container Oral TID BM  . insulin aspart  0-15 Units Subcutaneous 6 times per day  . metoprolol tartrate  25 mg Oral BID  . metronidazole  500 mg Intravenous Q8H  . midodrine  5 mg Oral TID AC  . sodium chloride  10-40 mL Intracatheter Q12H  . sodium chloride  3 mL Intravenous Q12H  . Warfarin - Pharmacist Dosing Inpatient   Does not apply [phone]

## 2014-02-20 NOTE — Progress Notes (Signed)
I have seen and examined this patient and agree with the plan of care  [facility] W 02/20/2014, 9:25 PM

## 2014-02-20 NOTE — Progress Notes (Signed)
ANTICOAGULATION CONSULT NOTE - Follow-up Consult  Pharmacy Consult for Warfarin Indication: atrial fibrillation  Allergies  Allergen Reactions  . Bacitracin Other (See Comments)    unknown  . Norvasc [Amlodipine Besylate] Other (See Comments)    unknown    Patient Measurements: Height: 6\' 4"  (193 cm) Weight: 187 lb 13.3 oz (85.2 kg) IBW/kg (Calculated) : 86.8  Vital Signs: Temp: 97.5 F (36.4 C) (06/26 0916) Temp src: Oral (06/26 0916) BP: 97/64 mmHg (06/26 0916) Pulse Rate: 110 (06/26 0916)  Labs:  Recent Labs  02/18/14 0410 02/19/14 0415 02/19/14 0445 02/20/14 0555  HGB 9.6*  --   --   --   HCT 29.7*  --   --   --   PLT 222  --   --   --   LABPROT 21.7*  --  26.6* 31.2*  INR 1.96*  --  2.45* 3.01*  CREATININE  --  8.47*  --   --     Estimated Creatinine Clearance: 10.5 ml/min (by C-G formula based on Cr of 8.47).   Assessment: 43 YOM on warfarin PTA for hx Afib - held and reversed on admit d/t SBO with NPO status and GIB. INR resumed on 6/24 - no further bloody stools noted. INR today is slightly SUPRAtherapeutic and continues to rise rapidly (INR 3.01 << 2.45, goal of 2-3). The patient is noted to be on Flagyl (new) and amiodarone (on PTA) which will increase warfarin sensitivity. The patient is also only on clears/liquids which will reduce warfarin sensitivity. Will plan to hold warfarin this evening and f/u INR in the morning.   The patient and his wife were re-educated on warfarin today.   Goal of Therapy:  INR 2-3   Plan:  1. Hold warfarin today 2. Will continue to monitor for any signs/symptoms of bleeding and will follow up with PT/INR in the a.m.   Alycia Rossetti, PharmD, BCPS Clinical Pharmacist Pager: [phone] 02/20/2014 1:06 PM

## 2014-02-21 LAB — PROTIME-INR
INR: 3.75 — ABNORMAL HIGH (ref 0.00–1.49)
PROTHROMBIN TIME: 37.1 s — AB (ref 11.6–15.2)

## 2014-02-21 LAB — CBC
HCT: 28.1 % — ABNORMAL LOW (ref 39.0–52.0)
Hemoglobin: 9.1 g/dL — ABNORMAL LOW (ref 13.0–17.0)
MCH: 29 pg (ref 26.0–34.0)
MCHC: 32.4 g/dL (ref 30.0–36.0)
MCV: 89.5 fL (ref 78.0–100.0)
PLATELETS: 237 10*3/uL (ref 150–400)
RBC: 3.14 MIL/uL — ABNORMAL LOW (ref 4.22–5.81)
RDW: 16.9 % — AB (ref 11.5–15.5)
WBC: 11.4 10*3/uL — ABNORMAL HIGH (ref 4.0–10.5)

## 2014-02-21 LAB — RENAL FUNCTION PANEL
ALBUMIN: 1.9 g/dL — AB (ref 3.5–5.2)
BUN: 35 mg/dL — ABNORMAL HIGH (ref 6–23)
CALCIUM: 9.7 mg/dL (ref 8.4–10.5)
CO2: 22 mEq/L (ref 19–32)
CREATININE: 7.64 mg/dL — AB (ref 0.50–1.35)
Chloride: 98 mEq/L (ref 96–112)
GFR calc Af Amer: 8 mL/min — ABNORMAL LOW (ref >90)
GFR, EST NON AFRICAN AMERICAN: 7 mL/min — AB (ref >90)
Glucose, Bld: 137 mg/dL — ABNORMAL HIGH (ref 70–99)
PHOSPHORUS: 7.6 mg/dL — AB (ref 2.3–4.6)
Potassium: 3.9 mEq/L (ref 3.7–5.3)
SODIUM: 140 meq/L (ref 137–147)

## 2014-02-21 LAB — GLUCOSE, CAPILLARY
Glucose-Capillary: 105 mg/dL — ABNORMAL HIGH (ref 70–99)
Glucose-Capillary: 120 mg/dL — ABNORMAL HIGH (ref 70–99)
Glucose-Capillary: 130 mg/dL — ABNORMAL HIGH (ref 70–99)

## 2014-02-21 MED ORDER — SODIUM CHLORIDE 0.9 % IV BOLUS (SEPSIS)
250.0000 mL | Freq: Once | INTRAVENOUS | Status: AC
  Administered 2014-02-21: 250 mL via INTRAVENOUS

## 2014-02-21 MED ORDER — METRONIDAZOLE 500 MG PO TABS: 500.0000 mg | ORAL_TABLET | Freq: Three times a day (TID) | ORAL | Status: DC

## 2014-02-21 MED ORDER — MIDODRINE HCL 5 MG PO TABS
ORAL_TABLET | ORAL | Status: AC
  Filled 2014-02-21: qty 1

## 2014-02-21 MED ORDER — ALBUTEROL SULFATE (2.5 MG/3ML) 0.083% IN NEBU: 2.5000 mg | INHALATION_SOLUTION | RESPIRATORY_TRACT | Status: AC | PRN

## 2014-02-21 MED ORDER — METOPROLOL TARTRATE 25 MG PO TABS: 25.0000 mg | ORAL_TABLET | Freq: Two times a day (BID) | ORAL | Status: DC

## 2014-02-21 MED ORDER — MIDODRINE HCL 5 MG PO TABS: 5.0000 mg | ORAL_TABLET | Freq: Three times a day (TID) | ORAL | Status: DC

## 2014-02-21 MED ORDER — DARBEPOETIN ALFA-POLYSORBATE 100 MCG/0.5ML IJ SOLN
INTRAMUSCULAR | Status: AC
  Filled 2014-02-21: qty 0.5

## 2014-02-21 MED ORDER — WARFARIN SODIUM 2.5 MG PO TABS: 1.2500 mg | ORAL_TABLET | ORAL | Status: DC

## 2014-02-21 NOTE — Progress Notes (Signed)
Patient ID: [name] [name], male   DOB: 05-07-1951, 63 y.o.   MRN: [id number]    Subjective: Pt doing well.  Tolerating full liquids.  Still passing flatus and having BMs  Objective: Vital signs in last 24 hours: Temp:  [97.8 F (36.6 C)-99 F (37.2 C)] 98.1 F (36.7 C) (06/27 1200) Pulse Rate:  [60-124] 80 (06/27 1200) Resp:  [16-21] 20 (06/27 1200) BP: (81-131)/(31-88) 98/56 mmHg (06/27 1200) SpO2:  [93 %-98 %] 93 % (06/27 1200) Weight:  [184 lb 15.5 oz (83.9 kg)-190 lb 4.1 oz (86.3 kg)] 186 lb 8.2 oz (84.6 kg) (06/27 1034) Last BM Date: 02/21/14  Intake/Output from previous day: 06/26 0701 - 06/27 0700 In: 720 [P.O.:720] Out: -  Intake/Output this shift: Total I/O In: -  Out: 1500 [Other:1500]  PE: Abd: soft, NT, mild distention, +BS  Lab Results:   Recent Labs  02/21/14 0437  WBC 11.4*  HGB 9.1*  HCT 28.1*  PLT 237   BMET  Recent Labs  02/19/14 0415 02/21/14 0437  NA 142 140  K 4.4 3.9  CL 96 98  CO2 22 22  GLUCOSE 98 137*  BUN 53* 35*  CREATININE 8.47* 7.64*  CALCIUM 10.0 9.7   PT/INR  Recent Labs  02/20/14 0555 02/21/14 0437  LABPROT 31.2* 37.1*  INR 3.01* 3.75*   CMP     Component Value Date/Time   NA 140 02/21/2014 0437   K 3.9 02/21/2014 0437   K 4.4 05/04/2011 1426   CL 98 02/21/2014 0437   CL 101 05/04/2011 1427   CO2 22 02/21/2014 0437   CO2 25 05/04/2011 1427   GLUCOSE 137* 02/21/2014 0437   BUN 35* 02/21/2014 0437   CREATININE 7.64* 02/21/2014 0437   CALCIUM 9.7 02/21/2014 0437   CALCIUM 8.8 05/04/2011 1426   PROT 8.2 02/13/2014 2001   PROT 6.4 05/04/2011 1427   ALBUMIN 1.9* 02/21/2014 0437   AST 28 02/13/2014 2001   AST 22 05/04/2011 1427   ALT 19 02/13/2014 2001   ALKPHOS 95 02/13/2014 2001   BILITOT 2.5* 02/13/2014 2001   GFRNONAA 7* 02/21/2014 0437   GFRAA 8* 02/21/2014 0437   Lipase     Component Value Date/Time   LIPASE 39 02/13/2014 2022       Studies/Results: No results found.  Anti-infectives: Anti-infectives   Start      Dose/Rate Route Frequency Ordered Stop   02/21/14 0000  metroNIDAZOLE (FLAGYL) 500 MG tablet     500 mg Oral 3 times daily 02/21/14 1152     02/17/14 1400  metroNIDAZOLE (FLAGYL) IVPB 500 mg     500 mg 100 mL/hr over 60 Minutes Intravenous Every 8 hours 02/17/14 1310     02/17/14 1200  vancomycin (VANCOCIN) IVPB 1000 mg/200 mL premix  Status:  Discontinued     1,000 mg 200 mL/hr over 60 Minutes Intravenous Every T-Th-Sa (Hemodialysis) 02/16/14 1007 02/18/14 0807   02/14/14 1800  vancomycin (VANCOCIN) IVPB 1000 mg/200 mL premix     1,000 mg 200 mL/hr over 60 Minutes Intravenous  Once 02/14/14 1205 02/14/14 2232   02/14/14 0230  piperacillin-tazobactam (ZOSYN) IVPB 2.25 g  Status:  Discontinued     2.25 g 100 mL/hr over 30 Minutes Intravenous Every 8 hours 02/14/14 0219 02/18/14 0807   02/13/14 2330  vancomycin (VANCOCIN) 1,500 mg in sodium chloride 0.9 % 500 mL IVPB     1,500 mg 250 mL/hr over 120 Minutes Intravenous  Once 02/13/14 2251 02/19/14 1707  02/13/14 2300  piperacillin-tazobactam (ZOSYN) IVPB 3.375 g  Status:  Discontinued     3.375 g 100 mL/hr over 30 Minutes Intravenous  Once 02/13/14 2250 02/14/14 0219   02/13/14 2259  vancomycin (VANCOCIN) 1 GM/200ML IVPB  Status:  Discontinued    Comments:  Eulis Manly   : cabinet override      02/13/14 2259 02/13/14 2304   02/13/14 2245  levofloxacin (LEVAQUIN) IVPB 750 mg     750 mg 100 mL/hr over 90 Minutes Intravenous  Once 02/13/14 2238 02/19/14 1706       Assessment/Plan  1. SBO, resolving 2. C diff colitis 3. ESRD  Plan: 1. Pt doing well.  Agree with diet advancement to renal diet.  No further plans or recommendations from surgical standpoint.  We will sign off.   LOS: 8 days    OSBORNE,KELLY E 02/21/2014, 12:27 PM Pager: [phone]

## 2014-02-21 NOTE — Progress Notes (Addendum)
[facility] PROGRESS NOTE  [name] [name] [id number]:[id number] DOB: [birth year] DOA: 02/13/2014 PCP: [name], MD  Assessment/Plan: 63 y/o male with PMH including ESRD on HD T/Th/Sat, A. Fib on coumadin, HTN, CHF, cardiomyopathy LVEF of 15 to 20%, DM2, h/o UGI bleed present with nausea, vomiting and abdominal distention found to have SBO and small area of cecal pneumatosis. Transferred from [facility] to [facility]  1. SBO; +c diff;  CT: Small bowel obstruction pattern with change in caliber from the  proximal small bowel to the distal small bowel with a poor progression oral contrast. No transition point identified.  pneumatosis of the tip of the cecum. -clinically improved, NG out; advance diet;  +BM; on flagyl; appreciate surgery input   2. Probable aspiration pneumonia,. Improved on IV atx; blood cultures neg;  3. c diff colitis, improved on flagyl;  4. Sepsis/sirs/ due to pneumonia, SBO present on admission;  -improving on atx, management as above;  5. ESRD on HD, appreciate nephrology input  6. A fib chronic, on AC/couamidn; On Amio 100mg  -cont coumadin, per cards: possible DCCV in 3 consecutive weeks following resolution of Cdiff and SBO 7. Coronary artery disease-continue statin.  8. CHF combined HF, chronic cardiomyopathy ; echo (11/2013): EF 15-20%. Severe global hypokinesis -cont beta blocker. coumadin; Blood pressure will not tolerate ACE inhibitor -History of mitral valve replacement   Possible d/c if okay with surgery, if tolerating diet   Consultants:  Surgery, critical care, cardiology   SIGNIFICANT EVENTS / STUDIES:  6/19 CT abdomen >>> SBO with small area of pneumatosis at the tip of the cecum. Chronic right pleural effusion. Small pneumothorax.  LINES / TUBES:  L IJ CVC 6/20 >>>  NGT ??? >>> 6/23  CULTURES:  6/20 MRSA PCR >>> pos  6/20 Blood >>>  6/23 C.Diff PCR >>> pos  ANTIBIOTICS:  Zosyn 6/20 >>> 6/24  Vancomycin 6/20 >>> 6/24  Flagyl 6/13 >>>    Code  Status: full Family Communication:  D/w patient  (indicate person spoken with, relationship, and if by phone, the number) Disposition Plan: home 24-48 hrs    Consultants:  Surgery, critical care, cardiology   SIGNIFICANT EVENTS / STUDIES:  6/19 CT abdomen >>> SBO with small area of pneumatosis at the tip of the cecum. Chronic right pleural effusion. Small pneumothorax.  LINES / TUBES:  L IJ CVC 6/20 >>>  NGT ??? >>> 6/23  CULTURES:  6/20 MRSA PCR >>> pos  6/20 Blood >>>  6/23 C.Diff PCR >>> pos  ANTIBIOTICS:  Zosyn 6/20 >>> 6/24  Vancomycin 6/20 >>> 6/24  Flagyl 6/13 >>>   HPI/Subjective: alert  Objective: Filed Vitals:   02/21/14 1034  BP: 91/40  Pulse: 61  Temp: 97.8 F (36.6 C)  Resp: 21    Intake/Output Summary (Last 24 hours) at 02/21/14 1138 Last data filed at 02/21/14 1034  Gross per 24 hour  Intake    360 ml  Output   1500 ml  Net  -1140 ml   Filed Weights   02/21/14 0500 02/21/14 0648 02/21/14 1034  Weight: 83.9 kg (184 lb 15.5 oz) 86.3 kg (190 lb 4.1 oz) 84.6 kg (186 lb 8.2 oz)    Exam:   General:  alert  Cardiovascular: s1,s2 rrr  Respiratory: R LL diminished AE  Abdomen: soft, nt,nd   Musculoskeletal: s/u amputee L   Data Reviewed: Basic Metabolic Panel:  Recent Labs Lab 02/15/14 0400 02/16/14 0400 02/17/14 0800 02/19/14 0415 02/21/14 0437  NA 140 139 139  142 140  K 4.9 5.3 5.8* 4.4 3.9  CL 95* 93* 90* 96 98  CO2 27 25 21 22 22   GLUCOSE 130* 121* 114* 98 137*  BUN 35* 58* 78* 53* 35*  CREATININE 6.63* 8.96* 10.97* 8.47* 7.64*  CALCIUM 10.2 10.2 10.2 10.0 9.7  MG 2.0 2.3  --   --   --   PHOS 5.2* 6.4*  --  8.4* 7.6*   Liver Function Tests:  Recent Labs Lab 02/19/14 0415 02/21/14 0437  ALBUMIN 2.1* 1.9*   No results found for this basename: LIPASE, AMYLASE,  in the last 168 hours No results found for this basename: AMMONIA,  in the last 168 hours CBC:  Recent Labs Lab 02/15/14 0400 02/16/14 0400 02/17/14 0800  02/18/14 0410 02/21/14 0437  WBC 9.2 11.2* 12.0* 10.2 11.4*  HGB 9.6* 9.8* 10.1* 9.6* 9.1*  HCT 29.7* 29.8* 30.7* 29.7* 28.1*  MCV 90.8 91.1 89.0 90.5 89.5  PLT 205 220 216 222 237   Cardiac Enzymes: No results found for this basename: CKTOTAL, CKMB, CKMBINDEX, TROPONINI,  in the last 168 hours BNP (last 3 results) No results found for this basename: PROBNP,  in the last 8760 hours CBG:  Recent Labs Lab 02/20/14 0750 02/20/14 1621 02/20/14 2037 02/21/14 0012 02/21/14 0423  GLUCAP 112* 128* 183* 105* 120*    Recent Results (from the past 240 hour(s))  MRSA PCR SCREENING     Status: Abnormal   Collection Time    02/14/14 12:48 AM      Result Value Ref Range Status   MRSA by PCR POSITIVE (*) NEGATIVE Final   Comment:            The GeneXpert MRSA Assay (FDA     approved for NASAL specimens     only), is one component of a     comprehensive MRSA colonization     surveillance program. It is not     intended to diagnose MRSA     infection nor to guide or     monitor treatment for     MRSA infections.     RESULT CALLED TO, READ BACK BY AND VERIFIED WITH:     NOTIFIED A CURRIN,RN 02/14/14 0222 BY RHOLMES  CULTURE, BLOOD (ROUTINE X 2)     Status: None   Collection Time    02/14/14  2:54 AM      Result Value Ref Range Status   Specimen Description BLOOD LEFT ARM   Final   Special Requests BOTTLES DRAWN AEROBIC ONLY 5CC   Final   Culture  Setup Time     Final   Value: 02/14/2014 11:29     Performed at [facility]   Culture     Final   Value: NO GROWTH 5 DAYS     Performed at [facility]   Report Status 02/20/2014 FINAL   Final  CULTURE, BLOOD (ROUTINE X 2)     Status: None   Collection Time    02/14/14  3:00 AM      Result Value Ref Range Status   Specimen Description BLOOD LEFT ARM   Final   Special Requests BOTTLES DRAWN AEROBIC ONLY 3CC   Final   Culture  Setup Time     Final   Value: 02/14/2014 11:29     Performed at [facility]    Culture     Final   Value: NO GROWTH 5 DAYS     Performed at  [facility]   Report Status 02/20/2014 FINAL   Final  CULTURE, EXPECTORATED SPUTUM-ASSESSMENT     Status: None   Collection Time    02/14/14  9:30 AM      Result Value Ref Range Status   Specimen Description SPUTUM   Final   Special Requests NONE   Final   Sputum evaluation     Final   Value: MICROSCOPIC FINDINGS SUGGEST THAT THIS SPECIMEN IS NOT REPRESENTATIVE OF LOWER RESPIRATORY SECRETIONS. PLEASE RECOLLECT.     CALLED TO Barnet Pall RN 02/14/14 [place]   Report Status 02/14/2014 FINAL   Final  CLOSTRIDIUM DIFFICILE BY PCR     Status: Abnormal   Collection Time    02/17/14  8:43 AM      Result Value Ref Range Status   C difficile by pcr POSITIVE (*) NEGATIVE Final   Comment: CRITICAL RESULT CALLED TO, READ BACK BY AND VERIFIED WITH:     Johna Sheriff RN 11:20 02/17/14 ([email or web address])     Studies: No results found.  Scheduled Meds: . amiodarone  100 mg Oral Daily  . aspirin EC  81 mg Oral QPM  . atorvastatin  20 mg Oral QPM  . darbepoetin (ARANESP) injection - DIALYSIS  100 mcg Intravenous Q Sat-HD  . famotidine (PEPCID) IV  20 mg Intravenous Q24H  . feeding supplement (RESOURCE BREEZE)  1 Container Oral TID BM  . insulin aspart  0-15 Units Subcutaneous 6 times per day  . metoprolol tartrate  25 mg Oral BID  . metronidazole  500 mg Intravenous Q8H  . midodrine  5 mg Oral TID AC  . sodium chloride  10-40 mL Intracatheter Q12H  . sodium chloride  3 mL Intravenous Q12H  . Warfarin - Pharmacist Dosing Inpatient   Does not apply q1800   Continuous Infusions:   Active Problems:   SBO (small bowel obstruction)   Septic shock    Time spent: >35 minutes     Kinnie Feil  [facility] Pager [phone]. If 7PM-7AM, please contact night-coverage at [email or web address], password [facility] 02/21/2014, 11:38 AM  LOS: 8 days

## 2014-02-21 NOTE — Procedures (Signed)
I have seen and examined this patient and agree with the plan of care. Patient seen on dialysis BP 107/58 goal 2 L Shandell Giovanni W 02/21/2014, 8:28 AM

## 2014-02-21 NOTE — Progress Notes (Signed)
Discussed with PA  Agree with plan  Leighton Ruff. Redmond Pulling, MD, FACS General, Bariatric, & Minimally Invasive Surgery [facility], [place]

## 2014-02-21 NOTE — Progress Notes (Signed)
ANTICOAGULATION CONSULT NOTE - Follow-up Consult  Pharmacy Consult for Warfarin Indication: atrial fibrillation  Allergies  Allergen Reactions  . Bacitracin Other (See Comments)    unknown  . Norvasc [Amlodipine Besylate] Other (See Comments)    unknown    Patient Measurements: Height: 6\' 4"  (193 cm) Weight: 190 lb 4.1 oz (86.3 kg) IBW/kg (Calculated) : 86.8  Vital Signs: Temp: 98 F (36.7 C) (06/27 0648) Temp src: Oral (06/27 0648) BP: 82/56 mmHg (06/27 0900) Pulse Rate: 74 (06/27 0900)  Labs:  Recent Labs  02/19/14 0415 02/19/14 0445 02/20/14 0555 02/21/14 0437  HGB  --   --   --  9.1*  HCT  --   --   --  28.1*  PLT  --   --   --  237  LABPROT  --  26.6* 31.2* 37.1*  INR  --  2.45* 3.01* 3.75*  CREATININE 8.47*  --   --  7.64*    Estimated Creatinine Clearance: 11.8 ml/min (by C-G formula based on Cr of 7.64).   Assessment: 1 YOM on warfarin PTA for hx Afib - held and reversed on admit d/t SBO with NPO status and GIB. INR resumed on 6/24 - no further bloody stools noted and H/H remains low, plt wnl. INR today is SUPRAtherapeutic and continues to rise rapidly (INR 2.45>3.01>3.75, goal of 2-3). The patient is noted to be on Flagyl (new) and amiodarone (on PTA) which will increase warfarin sensitivity. The patient is also only on clears/liquids which will reduce warfarin sensitivity. Will plan to continue to hold warfarin this evening and f/u INR in the morning.   The patient and his wife were re-educated on warfarin on 6/26.   Goal of Therapy:  INR 2-3   Plan:  1. Hold warfarin today 2. Will continue to monitor for any signs/symptoms of bleeding and will follow up with PT/INR in the a.m.   Harolyn Rutherford, PharmD Clinical Pharmacist - Resident Pager: [phone] Pharmacy: [phone] 02/21/2014 9:49 AM

## 2014-02-21 NOTE — Discharge Summary (Signed)
Physician Discharge Summary  [name] [name] JME:[id number] DOB: [birth year] DOA: 02/13/2014  PCP: [name], MD  Admit date: 02/13/2014 Discharge date: 02/21/2014  Time spent: >35 minutes  Recommendations for Outpatient Follow-up:  F/u with nephrologist for HD as scheduled  F/u with PCP in 3 days to check INR Discharge Diagnoses:  Active Problems:   SBO (small bowel obstruction)   Septic shock   Discharge Condition:  Stable   Diet recommendation: low sodium, DM  Filed Weights   02/21/14 0500 02/21/14 0648 02/21/14 1034  Weight: 83.9 kg (184 lb 15.5 oz) 86.3 kg (190 lb 4.1 oz) 84.6 kg (186 lb 8.2 oz)    History of present illness:  63 y/o male with PMH including ESRD on HD T/Th/Sat, A. Fib on coumadin, HTN, CHF, cardiomyopathy LVEF of 15 to 20%, DM2, h/o UGI bleed present with nausea, vomiting and abdominal distention found to have SBO and small area of cecal pneumatosis. Transferred from [facility] to [facility] Course:  1. SBO; +c diff; CT: Small bowel obstruction pattern with change in caliber from the proximal small bowel to the distal small bowel with a poor progression oral contrast. No transition point identified. pneumatosis of the tip of the cecum.  -clinically improved, NG out; advanced diet; +BM; on flagyl; appreciate surgery input  2. Probable aspiration pneumonia,. Improved on IV atx; blood cultures neg;  3. c diff colitis, improved on flagyl; 4. Sepsis/sirs/ due to pneumonia, SBO present on admission;  -improving on atx, management as above;  5. ESRD on HD, appreciate nephrology input  6. A fib chronic, on AC/couamidn; On Amio 100mg , on coumadin; hold cardizem dueto low BP, cont BB -cont coumadin, per cards: possible DCCV in 3 consecutive weeks following resolution of Cdiff and SBO -d/w patient, restart coumadin 6/29; recommended to f/u with PCP in 2-3 days to check INR due to possible interaction with flagyl   7. Coronary artery disease-continue statin.  ASA 8. CHF combined HF, chronic cardiomyopathy ; echo (11/2013): EF 15-20%. Severe global hypokinesis  -cont beta blocker. coumadin; Blood pressure will not tolerate ACE inhibitor  -History of mitral valve replacement   Possible d/c if okay with surgery, if tolerating diet        Procedures:  HD (i.e. Studies not automatically included, echos, thoracentesis, etc; not x-rays)  Consultations:  Surgery, nephrology, critical care   Discharge Exam: Filed Vitals:   02/21/14 1034  BP: 91/40  Pulse: 61  Temp: 97.8 F (36.6 C)  Resp: 21    General: alert Cardiovascular: s1,s2 irregular  Respiratory: CTA BL  Discharge Instructions     Medication List    STOP taking these medications       ciprofloxacin 500 MG tablet  Commonly known as:  CIPRO     diltiazem 180 MG 24 hr capsule  Commonly known as:  CARDIZEM CD      TAKE these medications       albuterol (2.5 MG/3ML) 0.083% nebulizer solution  Commonly known as:  PROVENTIL  Take 3 mLs (2.5 mg total) by nebulization every 2 (two) hours as needed for wheezing.     allopurinol 100 MG tablet  Commonly known as:  ZYLOPRIM  Take 100 mg by mouth daily.     amiodarone 100 MG tablet  Commonly known as:  PACERONE  Take 100 mg by mouth daily.     aspirin EC 81 MG tablet  Take 81 mg by mouth every evening.     atorvastatin  20 MG tablet  Commonly known as:  LIPITOR  Take 20 mg by mouth every evening.     DIALYVITE/ZINC PO  Take by mouth 3 (three) times a week. Patient has dialysis on Tuesdays, Thursdays, and Saturdays     folic acid 1 MG tablet  Commonly known as:  FOLVITE  Take 1 mg by mouth daily.     insulin glargine 100 UNIT/ML injection  Commonly known as:  LANTUS  Inject 10 Units into the skin at bedtime.     metoprolol tartrate 25 MG tablet  Commonly known as:  LOPRESSOR  Take 1 tablet (25 mg total) by mouth 2 (two) times daily.     metroNIDAZOLE 500 MG tablet  Commonly known as:  FLAGYL  Take 1  tablet (500 mg total) by mouth 3 (three) times daily.     midodrine 5 MG tablet  Commonly known as:  PROAMATINE  Take 1 tablet (5 mg total) by mouth 3 (three) times daily before meals.     pantoprazole 40 MG tablet  Commonly known as:  PROTONIX  Take 40 mg by mouth 2 (two) times daily.     RENVELA 800 MG tablet  Generic drug:  sevelamer carbonate  Take 800 mg by mouth 3 (three) times daily with meals.     SENSIPAR 30 MG tablet  Generic drug:  cinacalcet  Take 30 mg by mouth daily.     traMADol 50 MG tablet  Commonly known as:  ULTRAM  Take by mouth every 8 (eight) hours as needed for moderate pain.     traZODone 50 MG tablet  Commonly known as:  DESYREL  Take 1 tablet (50 mg total) by mouth at bedtime.     warfarin 2.5 MG tablet  Commonly known as:  COUMADIN  Take 0.5-1 tablets (1.25-2.5 mg total) by mouth See admin instructions. Take 1.25 mg (1/2 tab) daily EXCEPT for 2.5 mg (1 tablet) on Wednesdays only  Start taking on:  02/23/2014       Allergies  Allergen Reactions  . Bacitracin Other (See Comments)    unknown  . Norvasc [Amlodipine Besylate] Other (See Comments)    unknown       Follow-up Information   Follow up with [name], MD In 1 week.   Specialty:  Internal Medicine   Contact information:   [address] [place] [address] [phone]        The results of significant diagnostics from this hospitalization (including imaging, microbiology, ancillary and laboratory) are listed below for reference.    Significant Diagnostic Studies: Dg Chest 2 View  02/13/2014   CLINICAL DATA:  Shortness of breath and weakness.  EXAM: CHEST  2 VIEW  COMPARISON:  CT of the abdomen and pelvis performed earlier today at 9:41 p.m., and chest radiograph from 09/17/2013  FINDINGS: Trace right-sided pneumothorax is seen. A small to moderate right-sided pleural effusion is noted, with associated airspace opacification. This may reflect atelectasis or  pneumonia. Mild left basilar opacity may also reflect pneumonia, given the appearance on CT.  The heart is borderline enlarged. The patient is status post median sternotomy, with evidence of prior CABG. No acute osseous abnormalities are seen.  IMPRESSION: 1. Trace right-sided pneumothorax seen, as noted on CT. 2. Small-to-moderate right-sided pleural effusion, with associated airspace opacification; this may reflect atelectasis or pneumonia. Mild left basilar pneumonia seen. 3. Borderline cardiomegaly.   Electronically Signed   By: Garald Balding M.D.   On: 02/13/2014 23:22  Ct Abdomen Pelvis W Contrast  02/13/2014   CLINICAL DATA:  Abdominal pain, end-stage renal disease  EXAM: CT ABDOMEN AND PELVIS WITH CONTRAST  TECHNIQUE: Multidetector CT imaging of the abdomen and pelvis was performed using the standard protocol following bolus administration of intravenous contrast.  CONTRAST:  100 mL Isovue  COMPARISON:  CT chest 12/06/2011  FINDINGS: There is a chronic right pleural effusion with associated dense right basilar atelectasis within air bronchograms. Cannot exclude right lower lobe pneumonia. There is a small pneumothorax anterior to the right middle lobe. No pericardial fluid. Mild airspace disease in the lower left lower lobe could represent pneumonia or aspiration pneumonitis.  No focal hepatic lesion. The gallbladder, pancreas, spleen, adrenal glands, are normal. The kidneys show minimal enhancement. Cyst of the left kidney.  The stomach contains a moderate amount of oral contrast. The duodenum and jejunum are dilated with poor progression of the oral contrast. Proximal small bowel loops measure 4 cm. The most distal small bowel is near completely collapsed in the pelvis (images 78, series 2). At discrete transition point is not identified. There is gas within the wall of the cecum tip as seen on coronal image 51, series 4. The appendix appears normal. The ascending colon is collapsed. The transverse  and descending colon are collapsed.  Abdominal aorta is heavily calcified. No retroperitoneal periportal lymphadenopathy.  No free fluid the pelvis. The prostate gland and bladder normal. No pelvic lymphadenopathy. There is bilateral inguinal hernias. No aggressive osseous lesion. There is chronic appearing compression fracture of the L1 vertebral body.  IMPRESSION: 1. Chronic right pleural effusion with associated right lower lobe atelectasis. Cannot exclude right lower lobe pneumonia. 2. Small pneumothorax is noted anterior to the right middle lobe. Recommend chest radiograph to exclude a larger pneumothorax. 3. Pneumonitis versus pneumonia at the left lung base. Consider aspiration. 4. Small bowel obstruction pattern with change in caliber from the proximal small bowel to the distal small bowel with a poor progression oral contrast. No transition point identified. 5. Pneumatosis of the tip of the cecum. This could be a benign pneumatosis but cannot exclude ischemia. 6. Appendix is normal. Findings conveyed toCHRISTOPHER POLLINA on 02/13/2014  at22:02.   Electronically Signed   By: Suzy Bouchard M.D.   On: 02/13/2014 22:02   Dg Chest Port 1 View  02/15/2014   CLINICAL DATA:  Rule out pneumothorax  EXAM: PORTABLE CHEST - 1 VIEW  COMPARISON:  02/14/2014  FINDINGS: Left jugular central venous catheter tip in the SVC is unchanged. NG tube in the stomach.  Bibasilar airspace disease right greater than left with right pleural effusion is unchanged. No pneumothorax.  IMPRESSION: Bibasilar atelectasis/ infiltrate and right pleural effusion unchanged. No pneumothorax.   Electronically Signed   By: Franchot Gallo M.D.   On: 02/15/2014 10:31   Dg Chest Port 1 View  02/14/2014   CLINICAL DATA:  Central line and nasogastric tube placement.  EXAM: PORTABLE CHEST - 1 VIEW  COMPARISON:  Chest radiograph from 02/13/2014  FINDINGS: The patient's left IJ line is seen ending about the mid to distal SVC. An enteric tube is  noted extending below the diaphragm.  There is a mildly worsened small to moderate right-sided pleural effusion. The previously noted trace right pneumothorax is suggested but not well seen. Worsening bibasilar airspace opacification may reflect worsening pneumonia. Asymmetric interstitial edema might have a similar appearance. Underlying vascular congestion is seen.  The cardiomediastinal silhouette is enlarged. The patient status post median sternotomy, with  evidence of prior CABG. No acute osseous abnormalities are seen.  IMPRESSION: 1. Left IJ line seen ending about the mid to distal SVC. 2. Enteric tube noted extending below the diaphragm. 3. Mildly worsened small to moderate right-sided pleural effusion. Previously noted trace right pneumothorax is suggested but not well seen. 4. Worsening bibasilar airspace opacification may reflect worsening pneumonia. Asymmetric interstitial edema might have a similar appearance. 5. Underlying vascular congestion and cardiomegaly noted.   Electronically Signed   By: Garald Balding M.D.   On: 02/14/2014 03:04   Dg Abd 2 Views  02/16/2014   CLINICAL DATA:  Abdominal pain.  EXAM: ABDOMEN - 2 VIEW  COMPARISON:  02/15/2014.  FINDINGS: There is a nasogastric tube coiled in the stomach. There are multiple dilated loops of small bowel which measure up to 4.2 cm. No free intraperitoneal air identified.  IMPRESSION: 1. No change in high-grade small bowel obstruction pattern   Electronically Signed   By: Kerby Moors M.D.   On: 02/16/2014 09:25   Dg Abd Portable 1v  02/19/2014   CLINICAL DATA:  63 year old male with pain and small bowel obstruction. Initial encounter. End-stage renal disease.  EXAM: PORTABLE ABDOMEN - 1 VIEW  COMPARISON:  02/17/2014 and earlier.  FINDINGS: Portable AP view at 0627 hrs. Dilated gas-filled small bowel loops throughout much of the abdomen are mildly decreased in caliber (33 mm now versus 43 mm 2 days ago). Increased gas in decompressed left  colon.  Persistent opacification at the right lung base compatible with a combination of a fusion and airspace opacity.  IMPRESSION: 1. Mildly improved bowel gas pattern, now compatible with partial small bowel obstruction (previously high-grade). 2. Right pleural effusion.   Electronically Signed   By: Lars Pinks M.D.   On: 02/19/2014 08:13   Dg Abd Portable 1v  02/17/2014   CLINICAL DATA:  Small-bowel obstruction.  EXAM: PORTABLE ABDOMEN - 1 VIEW  COMPARISON:  02/16/2014.  FINDINGS: Persistent distention of small bowel loops noted. Bowel dilatation up to 4.2 cm again noted. No significant interval improvement. NG tube noted coiled in stomach. The stomach is nondistended. Paucity of colonic gas present. Aortoiliac atherosclerotic vascular disease. Degenerative changes lumbar spine and both hips.  IMPRESSION: Persistent unchanged small-bowel obstruction. NG tube noted coiled in stomach. The stomach is nondistended .   Electronically Signed   By: Marcello Moores  Register   On: 02/17/2014 08:10   Dg Abd Portable 1v  02/15/2014   CLINICAL DATA:  Small-bowel obstruction  EXAM: PORTABLE ABDOMEN - 1 VIEW  COMPARISON:  CT 02/13/2014  FINDINGS: Multiple dilated small bowel loops consistent with small bowel obstruction as noted on the CT. Upright view was not obtained to evaluate for air-fluid level or free air. No colonic gas is present.  IMPRESSION: High-grade small-bowel obstruction is unchanged.   Electronically Signed   By: Franchot Gallo M.D.   On: 02/15/2014 10:59    Microbiology: Recent Results (from the past 240 hour(s))  MRSA PCR SCREENING     Status: Abnormal   Collection Time    02/14/14 12:48 AM      Result Value Ref Range Status   MRSA by PCR POSITIVE (*) NEGATIVE Final   Comment:            The GeneXpert MRSA Assay (FDA     approved for NASAL specimens     only), is one component of a     comprehensive MRSA colonization     surveillance program. It is not  intended to diagnose MRSA      infection nor to guide or     monitor treatment for     MRSA infections.     RESULT CALLED TO, READ BACK BY AND VERIFIED WITH:     NOTIFIED A CURRIN,RN 02/14/14 0222 BY RHOLMES  CULTURE, BLOOD (ROUTINE X 2)     Status: None   Collection Time    02/14/14  2:54 AM      Result Value Ref Range Status   Specimen Description BLOOD LEFT ARM   Final   Special Requests BOTTLES DRAWN AEROBIC ONLY 5CC   Final   Culture  Setup Time     Final   Value: 02/14/2014 11:29     Performed at [facility]   Culture     Final   Value: NO GROWTH 5 DAYS     Performed at [facility]   Report Status 02/20/2014 FINAL   Final  CULTURE, BLOOD (ROUTINE X 2)     Status: None   Collection Time    02/14/14  3:00 AM      Result Value Ref Range Status   Specimen Description BLOOD LEFT ARM   Final   Special Requests BOTTLES DRAWN AEROBIC ONLY 3CC   Final   Culture  Setup Time     Final   Value: 02/14/2014 11:29     Performed at [facility]   Culture     Final   Value: NO GROWTH 5 DAYS     Performed at [facility]   Report Status 02/20/2014 FINAL   Final  CULTURE, EXPECTORATED SPUTUM-ASSESSMENT     Status: None   Collection Time    02/14/14  9:30 AM      Result Value Ref Range Status   Specimen Description SPUTUM   Final   Special Requests NONE   Final   Sputum evaluation     Final   Value: MICROSCOPIC FINDINGS SUGGEST THAT THIS SPECIMEN IS NOT REPRESENTATIVE OF LOWER RESPIRATORY SECRETIONS. PLEASE RECOLLECT.     CALLED TO Barnet Pall RN 02/14/14 [place]   Report Status 02/14/2014 FINAL   Final  CLOSTRIDIUM DIFFICILE BY PCR     Status: Abnormal   Collection Time    02/17/14  8:43 AM      Result Value Ref Range Status   C difficile by pcr POSITIVE (*) NEGATIVE Final   Comment: CRITICAL RESULT CALLED TO, READ BACK BY AND VERIFIED WITH:     Johna Sheriff RN 11:20 02/17/14 ([email or web address])     Labs: Basic Metabolic Panel:  Recent Labs Lab 02/15/14 0400 02/16/14 0400  02/17/14 0800 02/19/14 0415 02/21/14 0437  NA 140 139 139 142 140  K 4.9 5.3 5.8* 4.4 3.9  CL 95* 93* 90* 96 98  CO2 27 25 21 22 22   GLUCOSE 130* 121* 114* 98 137*  BUN 35* 58* 78* 53* 35*  CREATININE 6.63* 8.96* 10.97* 8.47* 7.64*  CALCIUM 10.2 10.2 10.2 10.0 9.7  MG 2.0 2.3  --   --   --   PHOS 5.2* 6.4*  --  8.4* 7.6*   Liver Function Tests:  Recent Labs Lab 02/19/14 0415 02/21/14 0437  ALBUMIN 2.1* 1.9*   No results found for this basename: LIPASE, AMYLASE,  in the last 168 hours No results found for this basename: AMMONIA,  in the last 168 hours CBC:  Recent Labs Lab 02/15/14 0400 02/16/14 0400 02/17/14 0800 02/18/14 0410 02/21/14 0437  WBC 9.2 11.2* 12.0* 10.2 11.4*  HGB 9.6* 9.8* 10.1* 9.6* 9.1*  HCT 29.7* 29.8* 30.7* 29.7* 28.1*  MCV 90.8 91.1 89.0 90.5 89.5  PLT 205 220 216 222 237   Cardiac Enzymes: No results found for this basename: CKTOTAL, CKMB, CKMBINDEX, TROPONINI,  in the last 168 hours BNP: BNP (last 3 results) No results found for this basename: PROBNP,  in the last 8760 hours CBG:  Recent Labs Lab 02/20/14 1621 02/20/14 2037 02/21/14 0012 02/21/14 0423 02/21/14 1117  GLUCAP 128* 183* 105* 120* 130*       Signed:  BURIEV, ULUGBEK N  [facility] 02/21/2014, 11:54 AM

## 2014-02-21 NOTE — Progress Notes (Signed)
Discharge instructions reviewed with patient and wife. Verbalized understanding of plan of care, medications, prescriptions, and appointment schedule. Patient is alert and oriented, hemodynamically stable, moves all extremeties, and denies pain.  He is being discharged after removal of L internal jugular central line.Telemetry was removed. Last bowel movement was a small amount of brown, semi-formed stool. Tolerated diet and feels ready to go home.COLLINS, SUSAN K

## 2014-02-23 LAB — GLUCOSE, CAPILLARY: GLUCOSE-CAPILLARY: 187 mg/dL — AB (ref 70–99)

## 2014-02-27 ENCOUNTER — Encounter (HOSPITAL_COMMUNITY): Payer: Self-pay | Admitting: Emergency Medicine

## 2014-02-27 ENCOUNTER — Inpatient Hospital Stay (HOSPITAL_COMMUNITY)
Admission: EM | Admit: 2014-02-27 | Discharge: 2014-03-03 | DRG: 377 | Disposition: A | Discharge status: Home / Self Care | Payer: Medicare Other | Attending: Internal Medicine | Admitting: Internal Medicine

## 2014-02-27 ENCOUNTER — Emergency Department (HOSPITAL_COMMUNITY): Payer: Medicare Other

## 2014-02-27 DIAGNOSIS — N2581 Secondary hyperparathyroidism of renal origin: Secondary | ICD-10-CM | POA: Diagnosis present

## 2014-02-27 DIAGNOSIS — I5042 Chronic combined systolic (congestive) and diastolic (congestive) heart failure: Secondary | ICD-10-CM | POA: Diagnosis present

## 2014-02-27 DIAGNOSIS — K56609 Unspecified intestinal obstruction, unspecified as to partial versus complete obstruction: Secondary | ICD-10-CM

## 2014-02-27 DIAGNOSIS — N039 Chronic nephritic syndrome with unspecified morphologic changes: Secondary | ICD-10-CM

## 2014-02-27 DIAGNOSIS — R791 Abnormal coagulation profile: Secondary | ICD-10-CM | POA: Diagnosis present

## 2014-02-27 DIAGNOSIS — I4891 Unspecified atrial fibrillation: Secondary | ICD-10-CM | POA: Diagnosis present

## 2014-02-27 DIAGNOSIS — I120 Hypertensive chronic kidney disease with stage 5 chronic kidney disease or end stage renal disease: Secondary | ICD-10-CM | POA: Diagnosis present

## 2014-02-27 DIAGNOSIS — A0472 Enterocolitis due to Clostridium difficile, not specified as recurrent: Secondary | ICD-10-CM | POA: Diagnosis present

## 2014-02-27 DIAGNOSIS — K219 Gastro-esophageal reflux disease without esophagitis: Secondary | ICD-10-CM | POA: Diagnosis present

## 2014-02-27 DIAGNOSIS — Z954 Presence of other heart-valve replacement: Secondary | ICD-10-CM

## 2014-02-27 DIAGNOSIS — D631 Anemia in chronic kidney disease: Secondary | ICD-10-CM | POA: Diagnosis present

## 2014-02-27 DIAGNOSIS — K5731 Diverticulosis of large intestine without perforation or abscess with bleeding: Principal | ICD-10-CM

## 2014-02-27 DIAGNOSIS — E119 Type 2 diabetes mellitus without complications: Secondary | ICD-10-CM | POA: Diagnosis present

## 2014-02-27 DIAGNOSIS — Z992 Dependence on renal dialysis: Secondary | ICD-10-CM

## 2014-02-27 DIAGNOSIS — Z87891 Personal history of nicotine dependence: Secondary | ICD-10-CM

## 2014-02-27 DIAGNOSIS — D62 Acute posthemorrhagic anemia: Secondary | ICD-10-CM | POA: Diagnosis present

## 2014-02-27 DIAGNOSIS — Z951 Presence of aortocoronary bypass graft: Secondary | ICD-10-CM

## 2014-02-27 DIAGNOSIS — Z794 Long term (current) use of insulin: Secondary | ICD-10-CM

## 2014-02-27 DIAGNOSIS — K6389 Other specified diseases of intestine: Secondary | ICD-10-CM

## 2014-02-27 DIAGNOSIS — IMO0002 Reserved for concepts with insufficient information to code with codable children: Secondary | ICD-10-CM

## 2014-02-27 DIAGNOSIS — N186 End stage renal disease: Secondary | ICD-10-CM

## 2014-02-27 DIAGNOSIS — Z7901 Long term (current) use of anticoagulants: Secondary | ICD-10-CM

## 2014-02-27 DIAGNOSIS — D649 Anemia, unspecified: Secondary | ICD-10-CM

## 2014-02-27 DIAGNOSIS — T45515A Adverse effect of anticoagulants, initial encounter: Secondary | ICD-10-CM | POA: Diagnosis present

## 2014-02-27 DIAGNOSIS — I482 Chronic atrial fibrillation, unspecified: Secondary | ICD-10-CM

## 2014-02-27 DIAGNOSIS — E785 Hyperlipidemia, unspecified: Secondary | ICD-10-CM

## 2014-02-27 DIAGNOSIS — M109 Gout, unspecified: Secondary | ICD-10-CM | POA: Diagnosis present

## 2014-02-27 DIAGNOSIS — E1165 Type 2 diabetes mellitus with hyperglycemia: Secondary | ICD-10-CM

## 2014-02-27 DIAGNOSIS — Z7982 Long term (current) use of aspirin: Secondary | ICD-10-CM

## 2014-02-27 DIAGNOSIS — I10 Essential (primary) hypertension: Secondary | ICD-10-CM

## 2014-02-27 DIAGNOSIS — E118 Type 2 diabetes mellitus with unspecified complications: Secondary | ICD-10-CM

## 2014-02-27 DIAGNOSIS — J90 Pleural effusion, not elsewhere classified: Secondary | ICD-10-CM

## 2014-02-27 DIAGNOSIS — S68118A Complete traumatic metacarpophalangeal amputation of other finger, initial encounter: Secondary | ICD-10-CM

## 2014-02-27 DIAGNOSIS — K922 Gastrointestinal hemorrhage, unspecified: Secondary | ICD-10-CM

## 2014-02-27 DIAGNOSIS — I2510 Atherosclerotic heart disease of native coronary artery without angina pectoris: Secondary | ICD-10-CM | POA: Diagnosis present

## 2014-02-27 DIAGNOSIS — I428 Other cardiomyopathies: Secondary | ICD-10-CM | POA: Diagnosis present

## 2014-02-27 DIAGNOSIS — I509 Heart failure, unspecified: Secondary | ICD-10-CM | POA: Diagnosis present

## 2014-02-27 DIAGNOSIS — I9589 Other hypotension: Secondary | ICD-10-CM | POA: Diagnosis present

## 2014-02-27 DIAGNOSIS — S78119A Complete traumatic amputation at level between unspecified hip and knee, initial encounter: Secondary | ICD-10-CM

## 2014-02-27 HISTORY — DX: Diverticulosis of intestine, part unspecified, without perforation or abscess without bleeding: K57.90

## 2014-02-27 HISTORY — DX: Unspecified intestinal obstruction, unspecified as to partial versus complete obstruction: K56.609

## 2014-02-27 HISTORY — DX: Enterocolitis due to Clostridium difficile, not specified as recurrent: A04.72

## 2014-02-27 HISTORY — DX: Other specified diseases of intestine: K63.89

## 2014-02-27 HISTORY — DX: Other hemorrhoids: K64.8

## 2014-02-27 LAB — COMPREHENSIVE METABOLIC PANEL
ALT: 10 U/L (ref 0–53)
AST: 19 U/L (ref 0–37)
Albumin: 2.1 g/dL — ABNORMAL LOW (ref 3.5–5.2)
Alkaline Phosphatase: 92 U/L (ref 39–117)
Anion gap: 14 (ref 5–15)
BUN: 23 mg/dL (ref 6–23)
CALCIUM: 8.5 mg/dL (ref 8.4–10.5)
CO2: 24 meq/L (ref 19–32)
Chloride: 102 mEq/L (ref 96–112)
Creatinine, Ser: 7.21 mg/dL — ABNORMAL HIGH (ref 0.50–1.35)
GFR calc Af Amer: 8 mL/min — ABNORMAL LOW (ref >90)
GFR calc non Af Amer: 7 mL/min — ABNORMAL LOW (ref >90)
Glucose, Bld: 153 mg/dL — ABNORMAL HIGH (ref 70–99)
Potassium: 5 mEq/L (ref 3.7–5.3)
SODIUM: 140 meq/L (ref 137–147)
Total Bilirubin: 1.2 mg/dL (ref 0.3–1.2)
Total Protein: 7 g/dL (ref 6.0–8.3)

## 2014-02-27 LAB — CBC WITH DIFFERENTIAL/PLATELET
BASOS ABS: 0 10*3/uL (ref 0.0–0.1)
Basophils Relative: 0 % (ref 0–1)
Eosinophils Absolute: 0.3 10*3/uL (ref 0.0–0.7)
Eosinophils Relative: 2 % (ref 0–5)
HCT: 26.7 % — ABNORMAL LOW (ref 39.0–52.0)
Hemoglobin: 8.5 g/dL — ABNORMAL LOW (ref 13.0–17.0)
LYMPHS PCT: 11 % — AB (ref 12–46)
Lymphs Abs: 1.4 10*3/uL (ref 0.7–4.0)
MCH: 30.1 pg (ref 26.0–34.0)
MCHC: 31.8 g/dL (ref 30.0–36.0)
MCV: 94.7 fL (ref 78.0–100.0)
Monocytes Absolute: 1.1 10*3/uL — ABNORMAL HIGH (ref 0.1–1.0)
Monocytes Relative: 9 % (ref 3–12)
NEUTROS ABS: 9.6 10*3/uL — AB (ref 1.7–7.7)
Neutrophils Relative %: 78 % — ABNORMAL HIGH (ref 43–77)
PLATELETS: 420 10*3/uL — AB (ref 150–400)
RBC: 2.82 MIL/uL — ABNORMAL LOW (ref 4.22–5.81)
RDW: 17.2 % — ABNORMAL HIGH (ref 11.5–15.5)
WBC: 12.4 10*3/uL — AB (ref 4.0–10.5)

## 2014-02-27 LAB — LACTIC ACID, PLASMA: Lactic Acid, Venous: 3.8 mmol/L — ABNORMAL HIGH (ref 0.5–2.2)

## 2014-02-27 LAB — TYPE AND SCREEN
ABO/RH(D): O POS
Antibody Screen: NEGATIVE

## 2014-02-27 LAB — LIPASE, BLOOD: Lipase: 62 U/L — ABNORMAL HIGH (ref 11–59)

## 2014-02-27 LAB — PROTIME-INR
INR: 7.29 — AB (ref 0.00–1.49)
PROTHROMBIN TIME: 62.3 s — AB (ref 11.6–15.2)

## 2014-02-27 LAB — CLOSTRIDIUM DIFFICILE BY PCR: CDIFFPCR: POSITIVE — AB

## 2014-02-27 MED ORDER — SODIUM CHLORIDE 0.9 % IV SOLN: INTRAVENOUS | Status: DC

## 2014-02-27 MED ORDER — SODIUM CHLORIDE 0.9 % IV BOLUS (SEPSIS)
500.0000 mL | Freq: Once | INTRAVENOUS | Status: AC
  Administered 2014-02-27: 21:00:00 via INTRAVENOUS

## 2014-02-27 MED ORDER — SODIUM CHLORIDE 0.9 % IJ SOLN
INTRAMUSCULAR | Status: AC
  Filled 2014-02-27: qty 400

## 2014-02-27 MED ORDER — IOHEXOL 300 MG/ML  SOLN
50.0000 mL | Freq: Once | INTRAMUSCULAR | Status: AC | PRN
  Administered 2014-02-27: 50 mL via ORAL

## 2014-02-27 MED ORDER — VITAMIN K1 10 MG/ML IJ SOLN
10.0000 mg | Freq: Once | INTRAVENOUS | Status: AC
  Administered 2014-02-27: 10 mg via INTRAVENOUS
  Filled 2014-02-27: qty 1

## 2014-02-27 MED ORDER — VITAMIN K1 10 MG/ML IJ SOLN
INTRAMUSCULAR | Status: AC
  Filled 2014-02-27: qty 1

## 2014-02-27 MED ORDER — IOHEXOL 300 MG/ML  SOLN
100.0000 mL | Freq: Once | INTRAMUSCULAR | Status: AC | PRN
  Administered 2014-02-27: 100 mL via INTRAVENOUS

## 2014-02-27 NOTE — ED Notes (Signed)
Patient drinking po contrast 

## 2014-02-27 NOTE — ED Provider Notes (Signed)
CSN: [id number]     Arrival date & time 02/27/14  1934 History   First MD Initiated Contact with Patient 02/27/14 1950     Chief Complaint  Patient presents with  . Rectal Bleeding     HPI Pt was seen at [place]. Per pt and his wife, c/o gradual onset and persistence of multiple intermittent episodes of "rectal bleeding" since this afternoon. Pt states he "can't remember what time it started" but knows he has been passing BRBPR "every hour or 2 for the past few hours."  Denies passing large or hard stool before bleeding began. Denies rectal pain, no rectal discharge, no abd pain, no N/V/D, no CP/SOB. Pt endorses he went to his usual HD treatment yesterday (Tu, Th, Sat). Family states pt was discharged from [facility] on 02/21/14 for dx cdiff colitis, SBO and sepsis. Pt endorses he has been taking his flagyl as prescribed since discharge.    Past Medical History  Diagnosis Date  . UGI bleed 02/12/11    On anticoagulation; EGD w/snare polypectomy-multiple polypoid lesions antrum(benign), Chronic active gastritis (NEGATIVE H pylori)  . Anemia, chronic disease   . Sepsis due to enterococcus 02/11/11  . Atrial fibrillation     On coumadin  . Coronary atherosclerosis of native coronary artery     Multivessel status post CABG  . Cardiomyopathy     LVEF 40-45% 01/2011  . Type 2 diabetes mellitus   . Essential hypertension, benign   . Hyperlipemia   . Gout   . S/P patent foramen ovale closure   . ESRD on hemodialysis     Started dialysis sometime around 2008-2009.  Gets HD in [place], [place] with Dr Hinda Lenis on a TTS schedule.  Runs 3.5 hours with dry wt 86.5 kg as of June 2015.  Had a L arm AVF which didn't work and has been using his R forearm AVF for several years.    . History of nuclear stress test 07/04/2010    dipyridamole; mod fixed inferolateral defect; non-diagnostic for ischemia; low risk scan   . Hyperbilirubinemia 08/10/2013  . SBO (small bowel obstruction) 01/2014  . Pneumatosis coli 01/2014    of  cecum  . Clostridium difficile colitis 01/2014  . Chronic anticoagulation     coumadin  . Diverticulosis   . Internal hemorrhoids    Past Surgical History  Procedure Laterality Date  . Mitral valve replacement  01/05/2007    Bioprosthetic 67mm Edwards precordial tissue (Dr. Servando Snare)  . Coronary artery bypass graft  01/05/2007    LIMA-LAD, SVG-OM, seq. SVG-PDA, PLA-RCA  . Colonoscopy  06/23/2011    Dr. Oneida Alar: multiple sessile and pedunculated polyps, moderate diverticulosis, internal hemorrhoids, +ADENOMATOUS POLYPS, consider genetic testing, surveillance in October 2015   . Esophagogastroduodenoscopy  02/12/2011    CHRONIC ACTIVE GASTRITIS, NO H. pylori  . Transthoracic echocardiogram  11/2011    EF 25% w/ mod dilatation of LV & mod LVH; LA severely dilated; mod-severe dilation of RV with mod-severe decrease in RV function; mild MR & TR  . Cardioversion  04/19/2007    Dr. Marella Chimes  . Transthoracic echocardiogram  02/14/2011    EF 40-45%, mod conc LVH; ventricular septum with motion showing abnormal function, dyssynergy, paradox; mild AS; mild-mod MR stenosis; LA severely dilated; aptrial septum bowed from L to R with increased LA pressure   . Amputation Right 07/14/2013    Procedure: AMPUTATION DIGIT;  Surgeon: [name], MD;  Location: [facility];  Service: General;  Laterality: Right;  .  Amputation Right 08/08/2013    Procedure: AMPUTATION BELOW KNEE;  Surgeon: [name], MD;  Location: [facility];  Service: General;  Laterality: Right;  . Stump revision Right 09/19/2013    Procedure: STUMP REVISION;  Surgeon: Scherry Ran, MD;  Location: [facility];  Service: General;  Laterality: Right;  . Amputation Right 10/17/2013    Procedure: AMPUTATION ABOVE KNEE;  Surgeon: [name], MD;  Location: [facility];  Service: General;  Laterality: Right;   Family History  Problem Relation Age of Onset  . Diabetes Mother   . Diabetes Father   . Diabetes Sister   . Colon cancer Neg Hx     History  Substance Use Topics  . Smoking status: Former Smoker -- 1.00 packs/day for 8 years    Types: Cigarettes    Quit date: 11/27/2006  . Smokeless tobacco: Never Used     Comment: quit about 5 yrs  . Alcohol Use: No    Review of Systems ROS: Statement: All systems negative except as marked or noted in the HPI; Constitutional: Negative for fever and chills. ; ; Eyes: Negative for eye pain, redness and discharge. ; ; ENMT: Negative for ear pain, hoarseness, nasal congestion, sinus pressure and sore throat. ; ; Cardiovascular: Negative for chest pain, palpitations, diaphoresis, dyspnea and peripheral edema. ; ; Respiratory: Negative for cough, wheezing and stridor. ; ; Gastrointestinal: Negative for nausea, vomiting, diarrhea, abdominal pain, hematemesis, jaundice and +rectal bleeding. . ; ; Genitourinary: Negative for dysuria, flank pain and hematuria. ; ; Musculoskeletal: Negative for back pain and neck pain. Negative for swelling and trauma.; ; Skin: Negative for pruritus, rash, abrasions, blisters, bruising and skin lesion.; ; Neuro: Negative for headache, lightheadedness and neck stiffness. Negative for weakness, altered level of consciousness , altered mental status, extremity weakness, paresthesias, involuntary movement, seizure and syncope.      Allergies  Bacitracin and Norvasc  Home Medications   Prior to Admission medications   Medication Sig Start Date End Date Taking? Authorizing Provider  albuterol (PROVENTIL) (2.5 MG/3ML) 0.083% nebulizer solution Take 3 mLs (2.5 mg total) by nebulization every 2 (two) hours as needed for wheezing. 02/21/14   Kinnie Feil, MD  allopurinol (ZYLOPRIM) 100 MG tablet Take 100 mg by mouth daily.  05/15/11   Historical Provider, MD  amiodarone (PACERONE) 100 MG tablet Take 100 mg by mouth daily.    Historical Provider, MD  aspirin EC 81 MG tablet Take 81 mg by mouth every evening.    Historical Provider, MD  atorvastatin (LIPITOR) 20 MG  tablet Take 20 mg by mouth every evening.  02/07/13   Rebecca Eaton, MD  B Complex-C-Zn-Folic Acid (DIALYVITE/ZINC PO) Take by mouth 3 (three) times a week. Patient has dialysis on Tuesdays, Thursdays, and Saturdays    Historical Provider, MD  folic acid (FOLVITE) 1 MG tablet Take 1 mg by mouth daily.      Historical Provider, MD  insulin glargine (LANTUS) 100 UNIT/ML injection Inject 10 Units into the skin at bedtime.    Historical Provider, MD  metoprolol tartrate (LOPRESSOR) 25 MG tablet Take 1 tablet (25 mg total) by mouth 2 (two) times daily. 02/21/14   Kinnie Feil, MD  metroNIDAZOLE (FLAGYL) 500 MG tablet Take 1 tablet (500 mg total) by mouth 3 (three) times daily. 02/21/14   Kinnie Feil, MD  midodrine (PROAMATINE) 5 MG tablet Take 1 tablet (5 mg total) by mouth 3 (three) times daily before meals. 02/21/14  Kinnie Feil, MD  pantoprazole (PROTONIX) 40 MG tablet Take 40 mg by mouth 2 (two) times daily.  05/25/11   Historical Provider, MD  RENVELA 800 MG tablet Take 800 mg by mouth 3 (three) times daily with meals.  05/03/11   Historical Provider, MD  SENSIPAR 30 MG tablet Take 30 mg by mouth daily. 01/22/14   Historical Provider, MD  traMADol (ULTRAM) 50 MG tablet Take by mouth every 8 (eight) hours as needed for moderate pain.    Historical Provider, MD  traZODone (DESYREL) 50 MG tablet Take 1 tablet (50 mg total) by mouth at bedtime. 08/13/13   Nita Sells, MD  warfarin (COUMADIN) 2.5 MG tablet Take 0.5-1 tablets (1.25-2.5 mg total) by mouth See admin instructions. Take 1.25 mg (1/2 tab) daily EXCEPT for 2.5 mg (1 tablet) on Wednesdays only 02/23/14   Kinnie Feil, MD   BP 68/41  Pulse 93  Temp(Src) 97.5 F (36.4 C) (Oral)  Resp 20  Ht 6\' 1"  (1.854 m)  Wt 186 lb (84.369 kg)  BMI 24.55 kg/m2  SpO2 97% Patient Vitals for the past 24 hrs:  BP Temp Temp src Pulse Resp SpO2 Height Weight  02/27/14 2300 87/49 mmHg 98.7 F (37.1 C) Oral 78 24 100 % - -  02/27/14  2236 98/54 mmHg - - 91 20 100 % - -  02/27/14 2230 - - - - 17 - - -  02/27/14 2215 - - - 81 17 100 % - -  02/27/14 2200 - - - 76 19 100 % - -  02/27/14 2159 106/80 mmHg - - 91 20 100 % - -  02/27/14 1950 68/41 mmHg 97.5 F (36.4 C) Oral 93 20 97 % 6\' 1"  (1.854 m) 186 lb (84.369 kg)    Physical Exam 2000: Physical examination:  Nursing notes reviewed; Vital signs and O2 SAT reviewed;  Constitutional: Well developed, Well nourished, Well hydrated, In no acute distress; Head:  Normocephalic, atraumatic; Eyes: EOMI, PERRL, No scleral icterus; ENMT: Mouth and pharynx normal, Mucous membranes moist; Neck: Supple, Full range of motion, No lymphadenopathy; Cardiovascular: Regular rate and rhythm, No gallop; Respiratory: Breath sounds clear & equal bilaterally, No wheezes.  Speaking full sentences with ease, Normal respiratory effort/excursion; Chest: Nontender, Movement normal; Abdomen: Soft, Nontender, Nondistended, Normal bowel sounds. Rectal exam performed w/permission of pt and ED RN chaperone present.  Anal tone normal.  Non-tender, red blood in rectal vault, heme positive.  No fissures, no external hemorrhoids, no palp masses.; Genitourinary: No CVA tenderness; Extremities: Pulses normal, +RUE HD fistula with palp thrill. No tenderness, No edema, +RLE AKA.  Neuro: AA&Ox3, vague historian. Major CN grossly intact.  Speech clear. No gross focal motor or sensory deficits in extremities.; Skin: Color normal, Warm, Dry.   ED Course  Procedures   2005:  EPIC chart reviewed: pt's baseline SBP around 90. Will start 2 peripheral IV's and give IVF bolus now for SBP 68. Workup ordered.   2200: Pt has stood and transferred to wheelchair and commode by himself without distress. Pt had bloody BM while in ED; cdiff and GI pathogen panel pending.  INR supratherapeutic; IV vit K and FFP ordered. SBP increased to 106 after IVF bolus.  Pt continues to mentate per his baseline, talking without distress to ED staff and  family at his bedside. NAD, resps easy, abd continues NT. Dx and testing d/w pt and family.  Questions answered.  Verb understanding, agreeable to transfer to [facility] for admit. T/C to  CCM Dr. Jimmy Footman , case discussed, including:  HPI, pertinent PM/SHx, VS/PE, dx testing, ED course and treatment:  Agreeable to admit, requests to write temporary orders, obtain ICU bed to Dr. Anastasia Pall service.       EKG Interpretation None      MDM  MDM Reviewed: previous chart, nursing note and vitals Reviewed previous: labs, ECG and CT scan Interpretation: labs, ECG, x-ray and CT scan Total time providing critical care: 30-74 minutes. This excludes time spent performing separately reportable procedures and services. Consults: admitting MD    CRITICAL CARE Performed by: Alfonzo Feller Total critical care time: 45 Critical care time was exclusive of separately billable procedures and treating other patients. Critical care was necessary to treat or prevent imminent or life-threatening deterioration. Critical care was time spent personally by me on the following activities: development of treatment plan with patient and/or surrogate as well as nursing, discussions with consultants, evaluation of patient's response to treatment, examination of patient, obtaining history from patient or surrogate, ordering and performing treatments and interventions, ordering and review of laboratory studies, ordering and review of radiographic studies, pulse oximetry and re-evaluation of patient's condition.  Results for orders placed during the hospital encounter of 02/27/14  CBC WITH DIFFERENTIAL      Result Value Ref Range   WBC 12.4 (*) 4.0 - 10.5 K/uL   RBC 2.82 (*) 4.22 - 5.81 MIL/uL   Hemoglobin 8.5 (*) 13.0 - 17.0 g/dL   HCT 26.7 (*) 39.0 - 52.0 %   MCV 94.7  78.0 - 100.0 fL   MCH 30.1  26.0 - 34.0 pg   MCHC 31.8  30.0 - 36.0 g/dL   RDW 17.2 (*) 11.5 - 15.5 %   Platelets 420 (*) 150 - 400 K/uL    Neutrophils Relative % 78 (*) 43 - 77 %   Neutro Abs 9.6 (*) 1.7 - 7.7 K/uL   Lymphocytes Relative 11 (*) 12 - 46 %   Lymphs Abs 1.4  0.7 - 4.0 K/uL   Monocytes Relative 9  3 - 12 %   Monocytes Absolute 1.1 (*) 0.1 - 1.0 K/uL   Eosinophils Relative 2  0 - 5 %   Eosinophils Absolute 0.3  0.0 - 0.7 K/uL   Basophils Relative 0  0 - 1 %   Basophils Absolute 0.0  0.0 - 0.1 K/uL  COMPREHENSIVE METABOLIC PANEL      Result Value Ref Range   Sodium 140  137 - 147 mEq/L   Potassium 5.0  3.7 - 5.3 mEq/L   Chloride 102  96 - 112 mEq/L   CO2 24  19 - 32 mEq/L   Glucose, Bld 153 (*) 70 - 99 mg/dL   BUN 23  6 - 23 mg/dL   Creatinine, Ser 7.21 (*) 0.50 - 1.35 mg/dL   Calcium 8.5  8.4 - 10.5 mg/dL   Total Protein 7.0  6.0 - 8.3 g/dL   Albumin 2.1 (*) 3.5 - 5.2 g/dL   AST 19  0 - 37 U/L   ALT 10  0 - 53 U/L   Alkaline Phosphatase 92  39 - 117 U/L   Total Bilirubin 1.2  0.3 - 1.2 mg/dL   GFR calc non Af Amer 7 (*) >90 mL/min   GFR calc Af Amer 8 (*) >90 mL/min   Anion gap 14  5 - 15  LIPASE, BLOOD      Result Value Ref Range   Lipase 62 (*) 11 - 59 U/L  PROTIME-INR      Result Value Ref Range   Prothrombin Time 62.3 (*) 11.6 - 15.2 seconds   INR 7.29 (*) 0.00 - 1.49  LACTIC ACID, PLASMA      Result Value Ref Range   Lactic Acid, Venous 3.8 (*) 0.5 - 2.2 mmol/L  TYPE AND SCREEN      Result Value Ref Range   ABO/RH(D) O POS     Antibody Screen NEG     Sample Expiration 03/02/2014     Ct Abdomen Pelvis W Contrast 02/27/2014   CLINICAL DATA:  Rectal bleeding.  EXAM: CT ABDOMEN AND PELVIS WITH CONTRAST  TECHNIQUE: Multidetector CT imaging of the abdomen and pelvis was performed using the standard protocol following bolus administration of intravenous contrast.  CONTRAST:  34mL OMNIPAQUE IOHEXOL 300 MG/ML SOLN, 152mL OMNIPAQUE IOHEXOL 300 MG/ML SOLN  COMPARISON:  CT 02/13/2014.  Plain films 02/19/2014.  FINDINGS: Moderate to large right pleural effusion, increased in size since prior study. There  is also areas of increased density now within the right pleural space. If compressive atelectasis in the right lower lobe.  Small nodule in the left lower lobe posteriorly measures 5 mm. This is stable dating back to prior chest CT from 2013 hands compatible with benign nodule.  Liver, spleen, pancreas, adrenals are unremarkable. Kidneys are mildly atrophic with scattered small low-density areas, likely cysts. Bilateral perinephric stranding. No hydronephrosis.  Small gallstones layering within the gallbladder.  Stomach and small bowel are unremarkable. No evidence of small bowel obstruction currently. Descending colonic diverticulosis. No active diverticulitis. Again noted is mottled gas in the region of the cecal tip as seen on prior study, not significantly changed. This presumably represents pneumatosis within the cecal wall. This could be benign pneumatosis or related to ischemia. Appendix is visualized and is borderline dilated at 9 mm. No definite surrounding inflammatory process.  No free fluid, free air or adenopathy. Prostate is mildly enlarged. Seminal vesicles are calcified. Urinary bladder is decompressed.  Aorta and iliac vessels are heavily calcified as are the mesenteric vessels and renal arteries.  Right inguinal hernia containing fat, unchanged.  IMPRESSION: Continued mottled gas in the region of the cecal tip, presumably pneumatosis. This could be benign or related to ischemia.  Borderline enlarged appendix at 9 mm without surrounding inflammatory change.  Cholelithiasis.  Complex right pleural effusion, now with areas of increased soft tissue density throughout the right pleural space. This may be related to blood/hemothorax.  These results were called by telephone at the time of interpretation on 02/27/2014 at 10:05 PM to Dr. Francine Graven , who verbally acknowledged these results.   Electronically Signed   By: Rolm Baptise M.D.   On: 02/27/2014 22:05           Alfonzo Feller,  DO 03/02/14 [phone]

## 2014-02-27 NOTE — ED Notes (Signed)
CRITICAL VALUE ALERT  Critical value received:inr 7.29 Date of notification:  02/27/2014 Time of notification:  2115 Critical value read back yes Nurse who received alert: smoore rn MD notified (1st page):  [email or web address] Time of first page: 2116 MD notified (2nd page):  Time of second page:  Responding MD:  mcmannus Time MD responded:  2117

## 2014-02-27 NOTE — ED Notes (Signed)
Phoned [facility] about  Running vitamin k and blood plasma together per [facility] request.  She stated that they are not to be ran together and that the vit k can be ran in at 15 minutes.  Ruthy Dick in with [facility] to infuse the vit k and to start the FFP.

## 2014-02-27 NOTE — ED Notes (Addendum)
Having bloody BM every hour since this morning. Bp low, states this has happened before, but not this much blood. Bood is bright red.

## 2014-02-27 NOTE — ED Notes (Signed)
CRITICAL VALUE ALERT  Critical value received: C diff positive  Date of notification:  02/27/14  Time of notification:  2247  Critical value read back:Yes.    Nurse who received alert:  bkn  MD notified (1st page):  Vanita Panda  Time of first page:  2249  MD notified (2nd page):  Time of second page:  Responding MD:    Time MD responded:

## 2014-02-28 DIAGNOSIS — D62 Acute posthemorrhagic anemia: Secondary | ICD-10-CM

## 2014-02-28 DIAGNOSIS — K922 Gastrointestinal hemorrhage, unspecified: Secondary | ICD-10-CM | POA: Diagnosis present

## 2014-02-28 DIAGNOSIS — R791 Abnormal coagulation profile: Secondary | ICD-10-CM

## 2014-02-28 DIAGNOSIS — N186 End stage renal disease: Secondary | ICD-10-CM

## 2014-02-28 DIAGNOSIS — I4891 Unspecified atrial fibrillation: Secondary | ICD-10-CM

## 2014-02-28 DIAGNOSIS — Z992 Dependence on renal dialysis: Secondary | ICD-10-CM

## 2014-02-28 LAB — CBC
HCT: 24.9 % — ABNORMAL LOW (ref 39.0–52.0)
HCT: 25.6 % — ABNORMAL LOW (ref 39.0–52.0)
HEMATOCRIT: 20.1 % — AB (ref 39.0–52.0)
HEMOGLOBIN: 6.5 g/dL — AB (ref 13.0–17.0)
HEMOGLOBIN: 8.2 g/dL — AB (ref 13.0–17.0)
Hemoglobin: 8 g/dL — ABNORMAL LOW (ref 13.0–17.0)
MCH: 30.5 pg (ref 26.0–34.0)
MCH: 30.7 pg (ref 26.0–34.0)
MCH: 29.3 pg (ref 26.0–34.0)
MCHC: 32.1 g/dL (ref 30.0–36.0)
MCHC: 32.3 g/dL (ref 30.0–36.0)
MCHC: 32 g/dL (ref 30.0–36.0)
MCV: 95 fL (ref 78.0–100.0)
MCV: 94.8 fL (ref 78.0–100.0)
MCV: 91.4 fL (ref 78.0–100.0)
Platelets: 281 10*3/uL (ref 150–400)
Platelets: 264 10*3/uL (ref 150–400)
Platelets: 308 10*3/uL (ref 150–400)
RBC: 2.62 MIL/uL — ABNORMAL LOW (ref 4.22–5.81)
RBC: 2.12 MIL/uL — AB (ref 4.22–5.81)
RBC: 2.8 MIL/uL — ABNORMAL LOW (ref 4.22–5.81)
RDW: 16.9 % — AB (ref 11.5–15.5)
RDW: 16.9 % — ABNORMAL HIGH (ref 11.5–15.5)
RDW: 17.2 % — ABNORMAL HIGH (ref 11.5–15.5)
WBC: 9.4 10*3/uL (ref 4.0–10.5)
WBC: 6.9 10*3/uL (ref 4.0–10.5)
WBC: 7.3 10*3/uL (ref 4.0–10.5)

## 2014-02-28 LAB — BASIC METABOLIC PANEL
Anion gap: 13 (ref 5–15)
Anion gap: 12 (ref 5–15)
BUN: 24 mg/dL — ABNORMAL HIGH (ref 6–23)
BUN: 23 mg/dL (ref 6–23)
CALCIUM: 8.2 mg/dL — AB (ref 8.4–10.5)
CHLORIDE: 100 meq/L (ref 96–112)
CO2: 24 meq/L (ref 19–32)
CO2: 26 meq/L (ref 19–32)
Calcium: 8.1 mg/dL — ABNORMAL LOW (ref 8.4–10.5)
Chloride: 100 mEq/L (ref 96–112)
Creatinine, Ser: 7.56 mg/dL — ABNORMAL HIGH (ref 0.50–1.35)
Creatinine, Ser: 7.64 mg/dL — ABNORMAL HIGH (ref 0.50–1.35)
GFR calc Af Amer: 8 mL/min — ABNORMAL LOW (ref >90)
GFR calc Af Amer: 8 mL/min — ABNORMAL LOW (ref >90)
GFR calc non Af Amer: 7 mL/min — ABNORMAL LOW (ref >90)
GFR, EST NON AFRICAN AMERICAN: 7 mL/min — AB (ref >90)
GLUCOSE: 75 mg/dL (ref 70–99)
GLUCOSE: 73 mg/dL (ref 70–99)
POTASSIUM: 5 meq/L (ref 3.7–5.3)
Potassium: 4.9 mEq/L (ref 3.7–5.3)
Sodium: 137 mEq/L (ref 137–147)
Sodium: 138 mEq/L (ref 137–147)

## 2014-02-28 LAB — GLUCOSE, CAPILLARY
GLUCOSE-CAPILLARY: 83 mg/dL (ref 70–99)
GLUCOSE-CAPILLARY: 156 mg/dL — AB (ref 70–99)
GLUCOSE-CAPILLARY: 67 mg/dL — AB (ref 70–99)
Glucose-Capillary: 110 mg/dL — ABNORMAL HIGH (ref 70–99)
Glucose-Capillary: 70 mg/dL (ref 70–99)
Glucose-Capillary: 72 mg/dL (ref 70–99)
Glucose-Capillary: 80 mg/dL (ref 70–99)

## 2014-02-28 LAB — PREPARE RBC (CROSSMATCH)

## 2014-02-28 LAB — PREPARE FRESH FROZEN PLASMA: Unit division: 0

## 2014-02-28 LAB — TROPONIN I: Troponin I: <0.3 ng/mL (ref <0.30)

## 2014-02-28 LAB — PROTIME-INR
INR: 1.72 — ABNORMAL HIGH (ref 0.00–1.49)
INR: 1.45 (ref 0.00–1.49)
PROTHROMBIN TIME: 20.2 s — AB (ref 11.6–15.2)
Prothrombin Time: 17.6 seconds — ABNORMAL HIGH (ref 11.6–15.2)

## 2014-02-28 LAB — MRSA PCR SCREENING: MRSA by PCR: NEGATIVE

## 2014-02-28 MED ORDER — SODIUM CHLORIDE 0.9 % IV SOLN: 250.0000 mL | INTRAVENOUS | Status: DC | PRN

## 2014-02-28 MED ORDER — ASPIRIN 300 MG RE SUPP: 300.0000 mg | RECTAL | Status: AC

## 2014-02-28 MED ORDER — METRONIDAZOLE 500 MG PO TABS
500.0000 mg | ORAL_TABLET | Freq: Three times a day (TID) | ORAL | Status: DC
  Administered 2014-02-28 – 2014-03-03 (×11): 500 mg via ORAL
  Filled 2014-02-28 (×14): qty 1

## 2014-02-28 MED ORDER — BOOST / RESOURCE BREEZE PO LIQD
1.0000 | Freq: Three times a day (TID) | ORAL | Status: DC
  Administered 2014-02-28 – 2014-03-03 (×9): 1 via ORAL

## 2014-02-28 MED ORDER — FOLIC ACID 1 MG PO TABS
1.0000 mg | ORAL_TABLET | Freq: Every day | ORAL | Status: DC
  Administered 2014-02-28 – 2014-03-03 (×4): 1 mg via ORAL
  Filled 2014-02-28 (×4): qty 1

## 2014-02-28 MED ORDER — METOPROLOL TARTRATE 25 MG PO TABS
25.0000 mg | ORAL_TABLET | Freq: Two times a day (BID) | ORAL | Status: DC
  Administered 2014-02-28: 25 mg via ORAL
  Filled 2014-02-28 (×3): qty 1

## 2014-02-28 MED ORDER — TRAZODONE HCL 50 MG PO TABS
50.0000 mg | ORAL_TABLET | Freq: Every day | ORAL | Status: DC
  Administered 2014-02-28 – 2014-03-02 (×4): 50 mg via ORAL
  Filled 2014-02-28 (×5): qty 1

## 2014-02-28 MED ORDER — SODIUM CHLORIDE 0.9 % IV SOLN: INTRAVENOUS | Status: DC

## 2014-02-28 MED ORDER — MIDODRINE HCL 5 MG PO TABS
5.0000 mg | ORAL_TABLET | Freq: Three times a day (TID) | ORAL | Status: DC
  Administered 2014-02-28 – 2014-03-03 (×11): 5 mg via ORAL
  Filled 2014-02-28 (×14): qty 1

## 2014-02-28 MED ORDER — ASPIRIN 81 MG PO CHEW
324.0000 mg | CHEWABLE_TABLET | ORAL | Status: AC
  Administered 2014-02-28: 324 mg via ORAL
  Filled 2014-02-28: qty 4

## 2014-02-28 MED ORDER — ALLOPURINOL 100 MG PO TABS
100.0000 mg | ORAL_TABLET | Freq: Every day | ORAL | Status: DC
  Administered 2014-02-28 – 2014-03-03 (×4): 100 mg via ORAL
  Filled 2014-02-28 (×5): qty 1

## 2014-02-28 MED ORDER — AMIODARONE HCL 100 MG PO TABS
100.0000 mg | ORAL_TABLET | Freq: Every day | ORAL | Status: DC
  Administered 2014-02-28 – 2014-03-03 (×4): 100 mg via ORAL
  Filled 2014-02-28 (×4): qty 1

## 2014-02-28 MED ORDER — INSULIN ASPART 100 UNIT/ML ~~LOC~~ SOLN
0.0000 [IU] | Freq: Three times a day (TID) | SUBCUTANEOUS | Status: DC
  Administered 2014-02-28: 3 [IU] via SUBCUTANEOUS
  Administered 2014-03-01: 2 [IU] via SUBCUTANEOUS
  Administered 2014-03-02: 3 [IU] via SUBCUTANEOUS

## 2014-02-28 MED ORDER — ATORVASTATIN CALCIUM 20 MG PO TABS
20.0000 mg | ORAL_TABLET | Freq: Every evening | ORAL | Status: DC
  Administered 2014-02-28 – 2014-03-02 (×3): 20 mg via ORAL
  Filled 2014-02-28 (×4): qty 1

## 2014-02-28 MED ORDER — ALBUTEROL SULFATE (2.5 MG/3ML) 0.083% IN NEBU: 2.5000 mg | INHALATION_SOLUTION | RESPIRATORY_TRACT | Status: DC | PRN

## 2014-02-28 MED ORDER — METOPROLOL TARTRATE 12.5 MG HALF TABLET
12.5000 mg | ORAL_TABLET | Freq: Two times a day (BID) | ORAL | Status: DC
  Administered 2014-02-28 – 2014-03-03 (×7): 12.5 mg via ORAL
  Filled 2014-02-28 (×8): qty 1

## 2014-02-28 MED ORDER — PANTOPRAZOLE SODIUM 40 MG PO TBEC
40.0000 mg | DELAYED_RELEASE_TABLET | Freq: Two times a day (BID) | ORAL | Status: DC
  Administered 2014-02-28: 40 mg via ORAL
  Filled 2014-02-28: qty 1

## 2014-02-28 MED ORDER — INSULIN ASPART 100 UNIT/ML ~~LOC~~ SOLN: 0.0000 [IU] | Freq: Every day | SUBCUTANEOUS | Status: DC

## 2014-02-28 MED ORDER — BIOTENE DRY MOUTH MT LIQD
15.0000 mL | Freq: Two times a day (BID) | OROMUCOSAL | Status: DC
  Administered 2014-02-28 (×2): 15 mL via OROMUCOSAL

## 2014-02-28 MED ORDER — CINACALCET HCL 30 MG PO TABS
30.0000 mg | ORAL_TABLET | Freq: Every day | ORAL | Status: DC
  Administered 2014-02-28 – 2014-03-03 (×4): 30 mg via ORAL
  Filled 2014-02-28 (×5): qty 1

## 2014-02-28 MED ORDER — PANTOPRAZOLE SODIUM 40 MG IV SOLR
40.0000 mg | Freq: Two times a day (BID) | INTRAVENOUS | Status: DC
Start: 2014-02-28 — End: 2014-03-02
  Administered 2014-02-28 – 2014-03-02 (×5): 40 mg via INTRAVENOUS
  Filled 2014-02-28 (×7): qty 40

## 2014-02-28 MED ORDER — SEVELAMER CARBONATE 800 MG PO TABS
800.0000 mg | ORAL_TABLET | Freq: Three times a day (TID) | ORAL | Status: DC
  Administered 2014-02-28 – 2014-03-03 (×10): 800 mg via ORAL
  Filled 2014-02-28 (×13): qty 1

## 2014-02-28 NOTE — Progress Notes (Signed)
PULMONARY  / CRITICAL CARE MEDICINE  Name: [name] [name] MRN: [id number] DOB: [birth year] PCP [name], MD   ADMISSION DATE:  02/27/2014 LOS 1 days   PRIMARY SERVICE: CCM  CHIEF COMPLAINT:  LGIB  BRIEF PATIENT DESCRIPTION: 63 y/o male with PMH including ESRD on HD T/Th/Sat, A. Fib on coumadin, HTN, CHF, cardiomyopathy LVEF of 15 to 20%, DM2, h/o UGI bleed recently discharged on 02/21/14 after treatment for SBO and small area of cecal pneumatosis. Transferred from [facility] to [facility] for LGIB in setting of INR = 7.   LINES / TUBES: RLE AVG >>>  CULTURES: C-Diff 7/3 >> POSITIVE  ANTIBIOTICS: Flagyl (C.diff ) 6/23 >>   SIGNIFICANT EVENTS: 01/2011 - EGD On anticoagulation; EGD w/snare polypectomy-multiple polypoid lesions antrum (benign), Chronic active gastritis (NEGATIVE H pylori) .............................................................. 7/4 admitted to ICU from [facility] for LGIB.  3 units FFP, 1 PRBC, + Vit K   STUDIES:    SUBJECTIVE: RN reports 2 BM overnight, watery / dark red.  VSS.    VITAL SIGNS: Filed Vitals:   02/28/14 0730 02/28/14 0745 02/28/14 0754 02/28/14 0800  BP: 96/57 102/51  95/68  Pulse: 72 73 75 78  Temp:   98 F (36.7 C)   TempSrc:   Oral   Resp: 16 13 15 19   Height:      Weight:      SpO2: 100% 100% 100% 100%  2L per [place]  INTAKE / OUTPUT: I/O last 3 completed shifts: In: 482.5 [I.V.:75; Blood:407.5] Out: -   PHYSICAL EXAMINATION: General:  Well appearing adult male in NAD HEENT: normal x poor dentition Neck: supple. JVP 8. Carotids 2+ bilat; no bruits. No lymphadenopathy or thryomegaly appreciated. Cor: PMI nondisplaced. Irregular rate & rhythm. 2/6 SEM RSB Abdomen: soft, nontender, nondistended.  BSx4 active Extremities: no cyanosis, clubbing, rash, edema s/p R AKA  AVF RUE + thrill  Neuro: alert & oriented x3, cranial nerves grossly intact. Moves extremities w/o difficulty. Affect pleasant  LABS: PULMONARY No results found  for this basename: PHART, PCO2, PCO2ART, PO2, PO2ART, HCO3, TCO2, O2SAT,  in the last 168 hours  CBC  Recent Labs Lab 02/27/14 2022 02/28/14 0113 02/28/14 0541  HGB 8.5* 8.0* 6.5*  HCT 26.7* 24.9* 20.1*  WBC 12.4* 9.4 6.9  PLT 420* 281 264   COAGULATION  Recent Labs Lab 02/27/14 2022 02/28/14 0541  INR 7.29* 1.72*   CARDIAC  Recent Labs Lab 02/28/14 0542  TROPONINI <0.30   No results found for this basename: PROBNP,  in the last 168 hours   CHEMISTRY  Recent Labs Lab 02/27/14 2022 02/28/14 0541 02/28/14 0650  NA 140 137 138  K 5.0 5.0 4.9  CL 102 100 100  CO2 24 24 26   GLUCOSE 153* 75 73  BUN 23 24* 23  CREATININE 7.21* 7.56* 7.64*  CALCIUM 8.5 8.1* 8.2*   Estimated Creatinine Clearance: 11.8 ml/min (by C-G formula based on Cr of 7.64).  LIVER  Recent Labs Lab 02/27/14 2022 02/28/14 0541  AST 19  --   ALT 10  --   ALKPHOS 92  --   BILITOT 1.2  --   PROT 7.0  --   ALBUMIN 2.1*  --   INR 7.29* 1.72*   INFECTIOUS  Recent Labs Lab 02/27/14 2022  LATICACIDVEN 3.8*   ENDOCRINE CBG (last 3)   Recent Labs  02/28/14 0002 02/28/14 0426 02/28/14 0720  GLUCAP 110* 83 72     IMAGING x48h  Ct Abdomen Pelvis  W Contrast  02/27/2014   CLINICAL DATA:  Rectal bleeding.  EXAM: CT ABDOMEN AND PELVIS WITH CONTRAST  TECHNIQUE: Multidetector CT imaging of the abdomen and pelvis was performed using the standard protocol following bolus administration of intravenous contrast.  CONTRAST:  16mL OMNIPAQUE IOHEXOL 300 MG/ML SOLN, 160mL OMNIPAQUE IOHEXOL 300 MG/ML SOLN  COMPARISON:  CT 02/13/2014.  Plain films 02/19/2014.  FINDINGS: Moderate to large right pleural effusion, increased in size since prior study. There is also areas of increased density now within the right pleural space. If compressive atelectasis in the right lower lobe.  Small nodule in the left lower lobe posteriorly measures 5 mm. This is stable dating back to prior chest CT from 2013 hands  compatible with benign nodule.  Liver, spleen, pancreas, adrenals are unremarkable. Kidneys are mildly atrophic with scattered small low-density areas, likely cysts. Bilateral perinephric stranding. No hydronephrosis.  Small gallstones layering within the gallbladder.  Stomach and small bowel are unremarkable. No evidence of small bowel obstruction currently. Descending colonic diverticulosis. No active diverticulitis. Again noted is mottled gas in the region of the cecal tip as seen on prior study, not significantly changed. This presumably represents pneumatosis within the cecal wall. This could be benign pneumatosis or related to ischemia. Appendix is visualized and is borderline dilated at 9 mm. No definite surrounding inflammatory process.  No free fluid, free air or adenopathy. Prostate is mildly enlarged. Seminal vesicles are calcified. Urinary bladder is decompressed.  Aorta and iliac vessels are heavily calcified as are the mesenteric vessels and renal arteries.  Right inguinal hernia containing fat, unchanged.  IMPRESSION: Continued mottled gas in the region of the cecal tip, presumably pneumatosis. This could be benign or related to ischemia.  Borderline enlarged appendix at 9 mm without surrounding inflammatory change.  Cholelithiasis.  Complex right pleural effusion, now with areas of increased soft tissue density throughout the right pleural space. This may be related to blood/hemothorax.  These results were called by telephone at the time of interpretation on 02/27/2014 at 10:05 PM to Dr. Francine Graven , who verbally acknowledged these results.   Electronically Signed   By: Rolm Baptise M.D.   On: 02/27/2014 22:05    ASSESSMENT / PLAN:  GASTROINTESTINAL A:   Acute lower GI bleed in setting of supratherapeutic INR - Suspect diverticular bleed Recent c.diff colitis Known diverticulosis, pneumotosis P:   Trend H/H Hold ASA and coumadin.  Continue Protonix BID.  NPO x meds until evaluated  by GI.  GI Consult pending, appreciate input Continue Flagyl for recent c.diff.    HEMATOLOGIC A:  Acute blood loss anemia Supratherapeutic INR - likely due to abx, on coumdin for AF. S/p 3 units FFP, Vit K   P:  Hold coumadin.  Follow serial H/H.  Trend INR  DVT: SCD's PRBC's x1 7/4 am with hgb of 6.5, 2nd unit with HD Repeat CBC post 2nd until with HD, RN to enter when appropriate  CARDIOVASCULAR A:  CAD s/p CABG MVR (biprosthetic) - stable  Chronic systolic CHF - EF 62-70% - stable  Chronic AF - was in NSR in January 2015 P:  Hold coumadin due to LGIB.  Has been on amio with hopes of possible DC-CV in future. Will continue for now.  Continue b-blocker as Alred as SBP >= 100.  Reduce to 12.5 BID (home dose 25 mg BID) Hold diltiazem with low EF.  Assess EKG, troponin with anemia  RENAL A:   ESRD - T/TH/Sat P:  Nephrology Consult, appreciate assistance No emergent HD needs.   INFECTIOUS A:   Recent c.diff colitis P:   Continue Flagyl x 14 days. Stop date 7/7  ENDOCRINE A:   DM2 P:   Hold lantus while NPO. SSI.  PULMONARY A:   Chronic R effusion P:   Monitor respiratory status  GLOBAL SCDs for DVT prophylaxis as INR drifts down.    Noe Gens, NP-C [facility] Pgr: [phone] or [phone]  Attending:  I have seen and examined the patient with nurse practitioner/resident and agree with the note above.   Discussed with renal. GI to see today. Plan HD today if GI doesn't fee like he needs a procedure.  Roselie Awkward, MD [place] [facility] Pager: [phone] Cell: [phone] If no response, call [phone]  02/28/2014 8:36 AM

## 2014-02-28 NOTE — Progress Notes (Signed)
[facility]  Hemodynamically stable, no procedure other than HD planned today  Transfer to SDU, [place] service  Roselie Awkward, MD [place] [facility] Pager: [phone] Cell: [phone] If no response, call [phone]

## 2014-02-28 NOTE — Progress Notes (Signed)
INITIAL NUTRITION ASSESSMENT  DOCUMENTATION CODES Per approved criteria  -Not Applicable   INTERVENTION: - Resource Breeze TID - Diet advancement per MD - RD to monitor plan of care   NUTRITION DIAGNOSIS: Inadequate oral intake related to clear liquid diet as evidenced by diet order.   Goal: Advance diet as tolerated to renal/diabetic diet  Monitor:  Weights, labs, diet advancement   Reason for Assessment: Malnutrition screening tool   63 y.o. male  Admitting Dx: Lower GI bleed  ASSESSMENT: Pt with hx of ESRD on HD, HTN, DM2, CABG, and right AKA. Admitted last month for small bowel obstruction. Admitted currently for lower GI bleed.   - Pt and wife report pt's appetite and intake have been poor since d/c last month - Wife reports pt has been getting Ensure Complete and drinking 2/day - States he's been eating 100% of clear liquid meals, no nausea, tolerating diet well - Agreeable to getting Resource Breeze while on clear liquid diet  Height: Ht Readings from Last 1 Encounters:  02/28/14 6\' 4"  (1.93 m)    Weight: Wt Readings from Last 1 Encounters:  02/28/14 193 lb 5.5 oz (87.7 kg)    Ideal Body Weight: 186 lbs - adjusted for right AKA  % Ideal Body Weight: 104%  Wt Readings from Last 10 Encounters:  02/28/14 193 lb 5.5 oz (87.7 kg)  02/21/14 186 lb 8.2 oz (84.6 kg)  12/17/13 225 lb (102.059 kg)  10/20/13 193 lb 9 oz (87.8 kg)  10/20/13 193 lb 9 oz (87.8 kg)  09/24/13 210 lb (95.255 kg)  09/24/13 210 lb (95.255 kg)  08/13/13 223 lb 5.2 oz (101.3 kg)  08/13/13 223 lb 5.2 oz (101.3 kg)  07/16/13 237 lb 10.5 oz (107.8 kg)    Usual Body Weight: 186 lbs last month  % Usual Body Weight: 104%  BMI:  Body mass index is 23.54 kg/(m^2).  Estimated Nutritional Needs: Kcal: 2200-2400  Protein: 110-120 gm  Fluid: >1.2 L/day  Skin: Right AKA  Diet Order: Clear Liquid  EDUCATION NEEDS: -No education needs identified at this time   Intake/Output Summary  (Last 24 hours) at 02/28/14 1720 Last data filed at 02/28/14 1600  Gross per 24 hour  Intake 1413.33 ml  Output      0 ml  Net 1413.33 ml    Last BM: 7/4   Labs:   Recent Labs Lab 02/27/14 2022 02/28/14 0541 02/28/14 0650  NA 140 137 138  K 5.0 5.0 4.9  CL 102 100 100  CO2 24 24 26   BUN 23 24* 23  CREATININE 7.21* 7.56* 7.64*  CALCIUM 8.5 8.1* 8.2*  GLUCOSE 153* 75 73    CBG (last 3)   Recent Labs  02/28/14 0720 02/28/14 1114 02/28/14 1703  GLUCAP 72 80 156*    Scheduled Meds: . allopurinol  100 mg Oral Daily  . amiodarone  100 mg Oral Daily  . antiseptic oral rinse  15 mL Mouth Rinse BID  . atorvastatin  20 mg Oral QPM  . cinacalcet  30 mg Oral Q breakfast  . folic acid  1 mg Oral Daily  . insulin aspart  0-15 Units Subcutaneous TID WC  . insulin aspart  0-5 Units Subcutaneous QHS  . metoprolol tartrate  12.5 mg Oral BID  . metroNIDAZOLE  500 mg Oral 3 times per day  . midodrine  5 mg Oral TID AC  . pantoprazole (PROTONIX) IV  40 mg Intravenous Q12H  . sevelamer carbonate  800  mg Oral TID WC  . traZODone  50 mg Oral QHS    Continuous Infusions:   Past Medical History  Diagnosis Date  . UGI bleed 02/12/11    On anticoagulation; EGD w/snare polypectomy-multiple polypoid lesions antrum(benign), Chronic active gastritis (NEGATIVE H pylori)  . Anemia, chronic disease   . Sepsis due to enterococcus 02/11/11  . Atrial fibrillation     On coumadin  . Coronary atherosclerosis of native coronary artery     Multivessel status post CABG  . Cardiomyopathy     LVEF 40-45% 01/2011  . Type 2 diabetes mellitus   . Essential hypertension, benign   . Hyperlipemia   . Gout   . S/P patent foramen ovale closure   . ESRD on hemodialysis     Started dialysis sometime around 2008-2009.  Gets HD in [place], [place] with Dr Hinda Lenis on a TTS schedule.  Runs 3.5 hours with dry wt 86.5 kg as of June 2015.  Had a L arm AVF which didn't work and has been using his R forearm AVF for  several years.    . History of nuclear stress test 07/04/2010    dipyridamole; mod fixed inferolateral defect; non-diagnostic for ischemia; low risk scan   . Hyperbilirubinemia 08/10/2013  . SBO (small bowel obstruction) 01/2014  . Pneumatosis coli 01/2014    of cecum  . Clostridium difficile colitis 01/2014  . Chronic anticoagulation     coumadin  . Diverticulosis   . Internal hemorrhoids     Past Surgical History  Procedure Laterality Date  . Mitral valve replacement  01/05/2007    Bioprosthetic 26mm Edwards precordial tissue (Dr. Servando Snare)  . Coronary artery bypass graft  01/05/2007    LIMA-LAD, SVG-OM, seq. SVG-PDA, PLA-RCA  . Colonoscopy  06/23/2011    Dr. Oneida Alar: multiple sessile and pedunculated polyps, moderate diverticulosis, internal hemorrhoids, +ADENOMATOUS POLYPS, consider genetic testing, surveillance in October 2015   . Esophagogastroduodenoscopy  02/12/2011    CHRONIC ACTIVE GASTRITIS, NO H. pylori  . Transthoracic echocardiogram  11/2011    EF 25% w/ mod dilatation of LV & mod LVH; LA severely dilated; mod-severe dilation of RV with mod-severe decrease in RV function; mild MR & TR  . Cardioversion  04/19/2007    Dr. Marella Chimes  . Transthoracic echocardiogram  02/14/2011    EF 40-45%, mod conc LVH; ventricular septum with motion showing abnormal function, dyssynergy, paradox; mild AS; mild-mod MR stenosis; LA severely dilated; aptrial septum bowed from L to R with increased LA pressure   . Amputation Right 07/14/2013    Procedure: AMPUTATION DIGIT;  Surgeon: [name], MD;  Location: [facility];  Service: General;  Laterality: Right;  . Amputation Right 08/08/2013    Procedure: AMPUTATION BELOW KNEE;  Surgeon: [name], MD;  Location: [facility];  Service: General;  Laterality: Right;  . Stump revision Right 09/19/2013    Procedure: STUMP REVISION;  Surgeon: Scherry Ran, MD;  Location: [facility];  Service: General;  Laterality: Right;  . Amputation Right 10/17/2013     Procedure: AMPUTATION ABOVE KNEE;  Surgeon: [name], MD;  Location: [facility];  Service: General;  Laterality: Right;    Carlis Stable MS, RD, LDN [phone] Weekend/After Hours Pager

## 2014-02-28 NOTE — Progress Notes (Signed)
CRITICAL VALUE ALERT  Critical value received:  Hgb=6.5  Date of notification:  02/28/2014  Time of notification:  6:24 AM  Critical value read back:Yes.    Nurse who received alert:  Barrie Lyme, RN  MD notified (1st page):  Dr. Jimmy Footman  Time of first page:  [phone]  MD notified (2nd page):  Time of second page:  Responding MD:  Dr. Jimmy Footman  Time MD responded:  [phone]

## 2014-02-28 NOTE — H&P (Addendum)
PULMONARY  / CRITICAL CARE MEDICINE  Name: TEYTON PATTILLO MRN: [id number] DOB: 05-29-1951 PCP [name], MD   ADMISSION DATE:  02/27/2014 LOS 1 days   PRIMARY SERVICE: CCM  CHIEF COMPLAINT:  LGIB  BRIEF PATIENT DESCRIPTION:   63 y/o male with PMH including ESRD on HD T/Th/Sat, A. Fib on coumadin, HTN, CHF, cardiomyopathy LVEF of 15 to 20%, DM2, h/o UGI bleed recently discharged on 02/21/14 after treatment for SBO and small area of cecal pneumatosis. Transferred from [facility] to [facility] for LGIB in setting of INR = 7.   LINES / TUBES: N/A  CULTURES: N/A  ANTIBIOTICS: Flagyl for C.diff started 6/23   SIGNIFICANT EVENTS / STUDIES:  7/4 admitted to ICU from [facility] for LGIB   HISTORY OF PRESENT ILLNESS:   63 y/o male with PMH including ESRD on HD T/Th/Sat, A. Fib on coumadin, HTN,  systolic CHF with LVEF of 15 to 20%, CAD s/p CABG and bioprosthetic MVR, DM2, PAD s/p R AKA and h/o UGI bleed recently discharged on 02/21/14 after treatment for SBO and small area of cecal pneumatosis. Transferred from [facility] to [facility] for LGIB in setting of INR = 7.3  Recently admitted with c.diff, partial SBO and cecal pneumatosis. Resolved with medical therapy. Discharged home on flagyl. Was doing well until this evening when he developed several episodes of BRBPR. Said he had formed stool but toilet was filled up with BRB. Denies ab pain, diarrhea, N/V, fevers, chills. Has not been taking any NSAIDs  Went to [facility] ER. INR found to have INR 7.29. Hgb 8.5 (was 9.1 on 6/27). BP 87/49. Given 500cc NS and 1 u FFP and 1mg  vit k. On arrival had another episode of BRBPR.   According to records had colonoscopy 10/12: multiple sessile and pedunculated polyps, moderate diverticulosis, internal hemorrhoids, +ADENOMATOUS POLYPS. Was due for f/u this year.   EGD 6/12: On anticoagulation; EGD w/snare polypectomy-multiple polypoid lesions antrum(benign), Chronic active gastritis (NEGATIVE H pylori)   PAST  MEDICAL HISTORY :  Past Medical History  Diagnosis Date  . UGI bleed 02/12/11    On anticoagulation; EGD w/snare polypectomy-multiple polypoid lesions antrum(benign), Chronic active gastritis (NEGATIVE H pylori)  . Anemia, chronic disease   . Sepsis due to enterococcus 02/11/11  . Atrial fibrillation     On coumadin  . Coronary atherosclerosis of native coronary artery     Multivessel status post CABG  . Cardiomyopathy     LVEF 40-45% 01/2011  . Type 2 diabetes mellitus   . Essential hypertension, benign   . Hyperlipemia   . Gout   . S/P patent foramen ovale closure   . ESRD on hemodialysis     Started dialysis sometime around 2008-2009.  Gets HD in [place], [place] with Dr Hinda Lenis on a TTS schedule.  Runs 3.5 hours with dry wt 86.5 kg as of June 2015.  Had a L arm AVF which didn't work and has been using his R forearm AVF for several years.    . History of nuclear stress test 07/04/2010    dipyridamole; mod fixed inferolateral defect; non-diagnostic for ischemia; low risk scan   . Hyperbilirubinemia 08/10/2013  . SBO (small bowel obstruction) 01/2014  . Pneumatosis coli 01/2014    of cecum  . Clostridium difficile colitis 01/2014  . Chronic anticoagulation     coumadin  . Diverticulosis   . Internal hemorrhoids     Past Surgical History  Procedure Laterality Date  . Mitral valve replacement  01/05/2007    Bioprosthetic 35mm Edwards precordial tissue (Dr. Servando Snare)  . Coronary artery bypass graft  01/05/2007    LIMA-LAD, SVG-OM, seq. SVG-PDA, PLA-RCA  . Colonoscopy  06/23/2011    Dr. Oneida Alar: multiple sessile and pedunculated polyps, moderate diverticulosis, internal hemorrhoids, +ADENOMATOUS POLYPS, consider genetic testing, surveillance in October 2015   . Esophagogastroduodenoscopy  02/12/2011    CHRONIC ACTIVE GASTRITIS, NO H. pylori  . Transthoracic echocardiogram  11/2011    EF 25% w/ mod dilatation of LV & mod LVH; LA severely dilated; mod-severe dilation of RV with mod-severe decrease  in RV function; mild MR & TR  . Cardioversion  04/19/2007    Dr. Marella Chimes  . Transthoracic echocardiogram  02/14/2011    EF 40-45%, mod conc LVH; ventricular septum with motion showing abnormal function, dyssynergy, paradox; mild AS; mild-mod MR stenosis; LA severely dilated; aptrial septum bowed from L to R with increased LA pressure   . Amputation Right 07/14/2013    Procedure: AMPUTATION DIGIT;  Surgeon: [name], MD;  Location: [facility];  Service: General;  Laterality: Right;  . Amputation Right 08/08/2013    Procedure: AMPUTATION BELOW KNEE;  Surgeon: [name], MD;  Location: [facility];  Service: General;  Laterality: Right;  . Stump revision Right 09/19/2013    Procedure: STUMP REVISION;  Surgeon: Scherry Ran, MD;  Location: [facility];  Service: General;  Laterality: Right;  . Amputation Right 10/17/2013    Procedure: AMPUTATION ABOVE KNEE;  Surgeon: [name], MD;  Location: [facility];  Service: General;  Laterality: Right;    Family History  Problem Relation Age of Onset  . Diabetes Mother   . Diabetes Father   . Diabetes Sister   . Colon cancer Neg Hx      History   Social History  . Marital Status: Married    Spouse Name: N/A    Number of Children: 1  . Years of Education: N/A   Occupational History  . Retired     [employer]  .     Social History Main Topics  . Smoking status: Former Smoker -- 1.00 packs/day for 8 years    Types: Cigarettes    Quit date: 11/27/2006  . Smokeless tobacco: Never Used     Comment: quit about 5 yrs  . Alcohol Use: No  . Drug Use: No  . Sexual Activity: Not on file   Other Topics Concern  . Not on file   Social History Narrative  . No narrative on file     Allergies  Allergen Reactions  . Bacitracin Other (See Comments)    unknown  . Norvasc [Amlodipine Besylate] Other (See Comments)    unknown      (Not in an outpatient encounter)    Review of Systems: [y] = yes, [ ]  = no   General: Weight  gain [ ] ; Weight loss [ ] ; Anorexia [ ] ; Fatigue [ ] ; Fever [ ] ; Chills [ ] ; Weakness [ ]   Cardiac: Chest pain/pressure [ ] ; Resting SOB [ ] ; Exertional SOB [ ] ; Orthopnea [ ] ; Pedal Edema [ ] ; Palpitations [ ] ; Syncope [ ] ; Presyncope [ ] ; Paroxysmal nocturnal dyspnea[ ]   Pulmonary: Cough [ ] ; Wheezing[ ] ; Hemoptysis[ ] ; Sputum [ ] ; Snoring [ ]   GI: Vomiting[ ] ; Dysphagia[ ] ; Melena[ ] ; Hematochezia [ ] ; Heartburn[ ] ; Abdominal pain [ ] ; Constipation [ ] ; Diarrhea [ ] ; BRBPR [ y]  GU: Hematuria[ ] ; Dysuria [ ] ;  Nocturia[ ]   Vascular: Pain in legs with walking [ ] ; Pain in feet with lying flat [ ] ; Non-healing sores [ ] ; Stroke [ ] ; TIA [ ] ; Slurred speech [ ] ;  Neuro: Headaches[ ] ; Vertigo[ ] ; Seizures[ ] ; Paresthesias[ ] ;Blurred vision [ ] ; Diplopia [ ] ; Vision changes [ ]   Ortho/Skin: Arthritis Blue.Reese ]; Joint pain [ ] ; Muscle pain [ ] ; Joint swelling [ ] ; Back Pain [ ] ; Rash [ ]   Psych: Depression[ ] ; Anxiety[ ]   Heme: Bleeding problems [ ] ; Clotting disorders [ ] ; Anemia Blue.Reese ]  Endocrine: Diabetes [ y]; Thyroid dysfunction[ ]    SUBJECTIVE:   VITAL SIGNS: Filed Vitals:   02/27/14 2230 02/27/14 2236 02/27/14 2300 02/28/14 0000  BP:  98/54 87/49   Pulse:  91 78   Temp:   98.7 F (37.1 C) 97.4 F (36.3 C)  TempSrc:   Oral Oral  Resp: 17 20 24    Height:      Weight:      SpO2:  100% 100%       HEMODYNAMICS:   VENTILATOR SETTINGS: Vent Mode:  [-] PRVC FiO2 (%):  [30 %] 30 % Set Rate:  [28 bmp] 28 bmp Vt Set:  [420 mL] 420 mL PEEP:  [5 cmH20] 5 cmH20 Plateau Pressure:  [4 HWE99-37 cmH20] 20 cmH20 INTAKE / OUTPUT:       PHYSICAL EXAMINATION: General:  Well appearing. No resp difficulty HEENT: normal x poor dentition Neck: supple. JVP 8. Carotids 2+ bilat; no bruits. No lymphadenopathy or thryomegaly appreciated. Cor: PMI nondisplaced. Irregular rate & rhythm. 2/6 SEM RSB Abdomen: soft, nontender, nondistended. No hepatosplenomegaly. No bruits or masses. Good bowel  sounds. Extremities: no cyanosis, clubbing, rash, edema s/p R AXA  AVF RUE Neuro: alert & orientedx3, cranial nerves grossly intact. moves all 4 extremities w/o difficulty. Affect pleasant  LABS: PULMONARY No results found for this basename: PHART, PCO2, PCO2ART, PO2, PO2ART, HCO3, TCO2, O2SAT,  in the last 168 hours  CBC  Recent Labs Lab 02/21/14 0437 02/27/14 2022  HGB 9.1* 8.5*  HCT 28.1* 26.7*  WBC 11.4* 12.4*  PLT 237 420*    COAGULATION  Recent Labs Lab 02/21/14 0437 02/27/14 2022  INR 3.75* 7.29*    CARDIAC  No results found for this basename: TROPONINI,  in the last 168 hours No results found for this basename: PROBNP,  in the last 168 hours   CHEMISTRY  Recent Labs Lab 02/21/14 0437 02/27/14 2022  NA 140 140  K 3.9 5.0  CL 98 102  CO2 22 24  GLUCOSE 137* 153*  BUN 35* 23  CREATININE 7.64* 7.21*  CALCIUM 9.7 8.5  PHOS 7.6*  --    Estimated Creatinine Clearance: 11.5 ml/min (by C-G formula based on Cr of 7.21).   LIVER  Recent Labs Lab 02/21/14 0437 02/27/14 2022  AST  --  19  ALT  --  10  ALKPHOS  --  92  BILITOT  --  1.2  PROT  --  7.0  ALBUMIN 1.9* 2.1*  INR 3.75* 7.29*     INFECTIOUS  Recent Labs Lab 02/27/14 2022  LATICACIDVEN 3.8*     ENDOCRINE CBG (last 3)  No results found for this basename: GLUCAP,  in the last 72 hours       IMAGING x48h  Ct Abdomen Pelvis W Contrast  02/27/2014   CLINICAL DATA:  Rectal bleeding.  EXAM: CT ABDOMEN AND PELVIS WITH CONTRAST  TECHNIQUE: Multidetector CT imaging of the abdomen  and pelvis was performed using the standard protocol following bolus administration of intravenous contrast.  CONTRAST:  28mL OMNIPAQUE IOHEXOL 300 MG/ML SOLN, 180mL OMNIPAQUE IOHEXOL 300 MG/ML SOLN  COMPARISON:  CT 02/13/2014.  Plain films 02/19/2014.  FINDINGS: Moderate to large right pleural effusion, increased in size since prior study. There is also areas of increased density now within the right pleural  space. If compressive atelectasis in the right lower lobe.  Small nodule in the left lower lobe posteriorly measures 5 mm. This is stable dating back to prior chest CT from 2013 hands compatible with benign nodule.  Liver, spleen, pancreas, adrenals are unremarkable. Kidneys are mildly atrophic with scattered small low-density areas, likely cysts. Bilateral perinephric stranding. No hydronephrosis.  Small gallstones layering within the gallbladder.  Stomach and small bowel are unremarkable. No evidence of small bowel obstruction currently. Descending colonic diverticulosis. No active diverticulitis. Again noted is mottled gas in the region of the cecal tip as seen on prior study, not significantly changed. This presumably represents pneumatosis within the cecal wall. This could be benign pneumatosis or related to ischemia. Appendix is visualized and is borderline dilated at 9 mm. No definite surrounding inflammatory process.  No free fluid, free air or adenopathy. Prostate is mildly enlarged. Seminal vesicles are calcified. Urinary bladder is decompressed.  Aorta and iliac vessels are heavily calcified as are the mesenteric vessels and renal arteries.  Right inguinal hernia containing fat, unchanged.  IMPRESSION: Continued mottled gas in the region of the cecal tip, presumably pneumatosis. This could be benign or related to ischemia.  Borderline enlarged appendix at 9 mm without surrounding inflammatory change.  Cholelithiasis.  Complex right pleural effusion, now with areas of increased soft tissue density throughout the right pleural space. This may be related to blood/hemothorax.  These results were called by telephone at the time of interpretation on 02/27/2014 at 10:05 PM to Dr. Francine Graven , who verbally acknowledged these results.   Electronically Signed   By: Rolm Baptise M.D.   On: 02/27/2014 22:05    ASSESSMENT / PLAN:  GASTROINTESTINAL A:  1. Acute lower GI bleed in setting of supratherapeutic  INR       2. Recent c.diff colitis       3. Known diverticulosis P:   Suspect diverticular bleed. Hemoglobin currently stable. Will give another 2 units FFP and recheck INR. Will attempt to get 2nd PIV, if unable will place CVL. Hold ASA and coumadin. Continue Protonix. Keep NPO. Consult GI in am. Continue Flagyl for recent c.diff  HEMATOLOGIC A:  1. Acute blood loss anemia       2. Supratherapeutic INR - likely due to abx P:  On coumadin for AF. Hold coumadin. Vit K already given. Will give 2 more units FFP. May need more. Follow serial H/H. Check INR in am.  CARDIOVASCULAR A: 1. CAD s/p CABG MVR (biprosthetic)     2. Chronic systolic CHF - EF 40-10%     3. Chronic AF - was in NSR in January 2015 P:  HF and CAD stable. Will add troponin to AM labs. Has had AF since 1/15. Will stop coumadin due to LGIB. Has been on amio with hopes of possible DC-CV in future. Will continue for now. Continue b-blocker as [place] as SBP >= 100.  Stop diltiazem with low EF.   RENAL A:  ESRD - T/TH/Sat P:    Contact renal in am. No emergent HD needs.   INFECTIOUS A:  Recent c.diff colitis  P:   Continue Flagyl x 14 days. Stop date 7/7  ENDOCRINE A:  1. DM2 P:   Hold lantus while NPO. SSI.  GLOBAL SCDs for DVT prophylaxis as INR drifts down.     [facility] 02/28/2014 12:42 AM

## 2014-02-28 NOTE — Consult Note (Signed)
[facility] Consult: 11:22 AM 02/28/2014  LOS: 1 day    Referring Provider: Dr Lake Bells CCM Primary Care Physician:  [name], MD Primary Gastroenterologist:  Dr. Barney Drain.    Reason for Consultation:  Lower GI bleed.    HPI: [name] [name] is a 63 y.o. male.  ESRD, HTN, DM2, CABG w MVR, CAF on coumadin, cardiomyopathy with EF 15 to 20%. PVD with R RKA   3 unit UGIB in 01/2011. EGD then with multiple ulcerated polyps (tubulovillous adenomas and repeat screening planned for 05/2014). Recurrent anemia 10 2012.  Colonoscopy then with multiple polyps, diverticulosis and hemorrhoids.  Dr Melton Krebs added for constipation 12/2011 which helped but too expensive and problem improved with stool softeners and Miralax.  Amitiza also helps. On PPI for GERD sxs. Has not required transfusion since 2012.   6/19 - 02/21/14 admission with SBO and cecal pneumatosis. Was C diff +. Managed medically and treated with PO Flagyl.  Also ? Of aspiration PNA.  Had not yet completed Flagyl.  Stools had decreased to about 5 loose BMs per day.  No abdominal pain.  Appetite stable, not great.  No nausea.   Beginning yesterday evening was passing BPR.  No abdominal pain, no nausea, no dizziness, no chest pain with this.   Thursday dialysis uneventful, he recalls no issue with hypotension.  Admitted transfer from [facility] with LGIB in setting INR 7.29.  Now corrected to 1.7 with FFP.  Hgb dropped from 9.1 on 6/27 to 8.5 yesterday to 6.5 today. Received 1 unit PRBC so far. 1 additional unit to be given during HD today Interestingly BUN is not significantly elevated.   No use of NSAIDs but does take low dose ASA daily.     Past Medical History  Diagnosis Date  . UGI bleed 02/12/11    On anticoagulation; EGD w/snare  polypectomy-multiple polypoid lesions antrum(benign), Chronic active gastritis (NEGATIVE H pylori)  . Anemia, chronic disease   . Sepsis due to enterococcus 02/11/11  . Atrial fibrillation     On coumadin  . Coronary atherosclerosis of native coronary artery     Multivessel status post CABG  . Cardiomyopathy     LVEF 40-45% 01/2011  . Type 2 diabetes mellitus   . Essential hypertension, benign   . Hyperlipemia   . Gout   . S/P patent foramen ovale closure   . ESRD on hemodialysis     Started dialysis sometime around 2008-2009.  Gets HD in [place], [place] with Dr Hinda Lenis on a TTS schedule.  Runs 3.5 hours with dry wt 86.5 kg as of June 2015.  Had a L arm AVF which didn't work and has been using his R forearm AVF for several years.    . History of nuclear stress test 07/04/2010    dipyridamole; mod fixed inferolateral defect; non-diagnostic for ischemia; low risk scan   . Hyperbilirubinemia 08/10/2013  . SBO (small bowel obstruction) 01/2014  . Pneumatosis coli 01/2014    of cecum  . Clostridium difficile colitis 01/2014  . Chronic anticoagulation  coumadin  . Diverticulosis   . Internal hemorrhoids     Past Surgical History  Procedure Laterality Date  . Mitral valve replacement  01/05/2007    Bioprosthetic 2mm Edwards precordial tissue (Dr. Servando Snare)  . Coronary artery bypass graft  01/05/2007    LIMA-LAD, SVG-OM, seq. SVG-PDA, PLA-RCA  . Colonoscopy  06/23/2011    Dr. Oneida Alar: multiple sessile and pedunculated polyps, moderate diverticulosis, internal hemorrhoids, +ADENOMATOUS POLYPS, consider genetic testing, surveillance in October 2015   . Esophagogastroduodenoscopy  02/12/2011    CHRONIC ACTIVE GASTRITIS, NO H. pylori  . Transthoracic echocardiogram  11/2011    EF 25% w/ mod dilatation of LV & mod LVH; LA severely dilated; mod-severe dilation of RV with mod-severe decrease in RV function; mild MR & TR  . Cardioversion  04/19/2007    Dr. Marella Chimes  . Transthoracic echocardiogram   02/14/2011    EF 40-45%, mod conc LVH; ventricular septum with motion showing abnormal function, dyssynergy, paradox; mild AS; mild-mod MR stenosis; LA severely dilated; aptrial septum bowed from L to R with increased LA pressure   . Amputation Right 07/14/2013    Procedure: AMPUTATION DIGIT;  Surgeon: [name], MD;  Location: [facility];  Service: General;  Laterality: Right;  . Amputation Right 08/08/2013    Procedure: AMPUTATION BELOW KNEE;  Surgeon: [name], MD;  Location: [facility];  Service: General;  Laterality: Right;  . Stump revision Right 09/19/2013    Procedure: STUMP REVISION;  Surgeon: Scherry Ran, MD;  Location: [facility];  Service: General;  Laterality: Right;  . Amputation Right 10/17/2013    Procedure: AMPUTATION ABOVE KNEE;  Surgeon: [name], MD;  Location: [facility];  Service: General;  Laterality: Right;    Prior to Admission medications   Medication Sig Start Date End Date Taking? Authorizing Provider  albuterol (PROVENTIL) (2.5 MG/3ML) 0.083% nebulizer solution Take 3 mLs (2.5 mg total) by nebulization every 2 (two) hours as needed for wheezing. 02/21/14  Yes Kinnie Feil, MD  allopurinol (ZYLOPRIM) 100 MG tablet Take 100 mg by mouth daily.  05/15/11  Yes Historical Provider, MD  amiodarone (PACERONE) 100 MG tablet Take 100 mg by mouth daily.   Yes Historical Provider, MD  aspirin EC 81 MG tablet Take 81 mg by mouth every evening.   Yes Historical Provider, MD  atorvastatin (LIPITOR) 20 MG tablet Take 20 mg by mouth every evening.  02/07/13  Yes Rebecca Eaton, MD  B Complex-C-Zn-Folic Acid (DIALYVITE/ZINC PO) Take by mouth 3 (three) times a week. Patient has dialysis on Tuesdays, Thursdays, and Saturdays   Yes Historical Provider, MD  diltiazem (CARDIZEM CD) 180 MG 24 hr capsule Take 180 mg by mouth daily. 01/27/14  Yes Historical Provider, MD  folic acid (FOLVITE) 1 MG tablet Take 1 mg by mouth daily.     Yes Historical Provider, MD  insulin glargine  (LANTUS) 100 UNIT/ML injection Inject 10 Units into the skin at bedtime.   Yes Historical Provider, MD  metoprolol tartrate (LOPRESSOR) 25 MG tablet Take 1 tablet (25 mg total) by mouth 2 (two) times daily. 02/21/14  Yes Kinnie Feil, MD  metroNIDAZOLE (FLAGYL) 500 MG tablet Take 1 tablet (500 mg total) by mouth 3 (three) times daily. 02/21/14  Yes Kinnie Feil, MD  midodrine (PROAMATINE) 5 MG tablet Take 1 tablet (5 mg total) by mouth 3 (three) times daily before meals. 02/21/14  Yes Kinnie Feil, MD  pantoprazole (PROTONIX) 40 MG tablet  Take 40 mg by mouth 2 (two) times daily.  05/25/11  Yes Historical Provider, MD  RENVELA 800 MG tablet Take 800 mg by mouth 3 (three) times daily with meals.  05/03/11  Yes Historical Provider, MD  SENSIPAR 30 MG tablet Take 30 mg by mouth daily. 01/22/14  Yes Historical Provider, MD  traMADol (ULTRAM) 50 MG tablet Take by mouth every 8 (eight) hours as needed for moderate pain.   Yes Historical Provider, MD  traZODone (DESYREL) 50 MG tablet Take 1 tablet (50 mg total) by mouth at bedtime. 08/13/13  Yes Nita Sells, MD  warfarin (COUMADIN) 2.5 MG tablet Take 0.5-1 tablets (1.25-2.5 mg total) by mouth See admin instructions. Take 1.25 mg (1/2 tab) daily EXCEPT for 2.5 mg (1 tablet) on Wednesdays only 02/23/14  Yes Kinnie Feil, MD    Scheduled Meds: . allopurinol  100 mg Oral Daily  . amiodarone  100 mg Oral Daily  . antiseptic oral rinse  15 mL Mouth Rinse BID  . atorvastatin  20 mg Oral QPM  . cinacalcet  30 mg Oral Q breakfast  . folic acid  1 mg Oral Daily  . insulin aspart  0-15 Units Subcutaneous TID WC  . insulin aspart  0-5 Units Subcutaneous QHS  . metoprolol tartrate  12.5 mg Oral BID  . metroNIDAZOLE  500 mg Oral 3 times per day  . midodrine  5 mg Oral TID AC  . pantoprazole (PROTONIX) IV  40 mg Intravenous Q12H  . sevelamer carbonate  800 mg Oral TID WC  . traZODone  50 mg Oral QHS   Infusions:   PRN Meds: sodium chloride,  albuterol   Allergies as of 02/27/2014 - Review Complete 02/14/2014  Allergen Reaction Noted  . Bacitracin Other (See Comments) 08/05/2013  . Norvasc [amlodipine besylate] Other (See Comments) 07/02/2013    Family History  Problem Relation Age of Onset  . Diabetes Mother   . Diabetes Father   . Diabetes Sister   . Colon cancer Neg Hx     History   Social History  . Marital Status: Married    Spouse Name: N/A    Number of Children: 1  . Years of Education: N/A   Occupational History  . Retired     [employer]  .     Social History Main Topics  . Smoking status: Former Smoker -- 1.00 packs/day for 8 years    Types: Cigarettes    Quit date: 11/27/2006  . Smokeless tobacco: Never Used     Comment: quit about 5 yrs  . Alcohol Use: No  . Drug Use: No  . Sexual Activity: Not on file   Other Topics Concern  . Not on file   Social History Narrative  . No narrative on file    REVIEW OF SYSTEMS: Constitutional:  Weight stable.  No wekness or fatigue ENT:  No nose bleeds Pulm:  No SOB or cough CV:  No palpitations, no LE edema.  GU:  No hematuria on scant urine GI:  Per HPI Heme:  Per HPI   Transfusions:  Per HPI Neuro:  No headaches, no peripheral tingling or numbness Derm:  No itching, no rash or sores.  Endocrine:  No sweats or chills.  No polyuria or dysuria Immunization:  Not queried.  Travel:  None beyond local counties in last few months.    PHYSICAL EXAM: Vital signs in last 24 hours: Filed Vitals:   02/28/14 1100  BP: 120/68  Pulse: 77  Temp:   Resp: 22   Wt Readings from Last 3 Encounters:  02/28/14 87.7 kg (193 lb 5.5 oz)  02/21/14 84.6 kg (186 lb 8.2 oz)  12/17/13 102.059 kg (225 lb)    General: pleasant AAM. Looks better than his chart reads Head:  No asymmetry  Eyes:  No icterus or pallor Ears:  Not HOH  Nose:  No congestion or discharge Mouth:  A lot of missing teeth. No blood or lesions Neck:  No mass, no TMG, +JVD.  No  bruits.  Lungs:  Clear bil.  No dyspnea or cough Heart: RRR with 2/6 SEM RSB Abdomen:  Soft, NT, ND.  BS active.   Rectal: deferred   Musc/Skeltl: no joint swellilng .  S/p right BKA Extremities:  No pedal edema on left  Neurologic:  Oriented x 3.  No limb weakness.  No slurred speech.  No tremor Skin:  No rash or sores Tattoos:  none Nodes:  No cervical adenopathy   Psych:  Pleasant, cooperative, relaxed.  Fluid speech.   Intake/Output from previous day: 07/03 0701 - 07/04 0700 In: 482.5 [I.V.:75; Blood:407.5] Out: -  Intake/Output this shift: Total I/O In: 570.8 [P.O.:240; Blood:330.8] Out: -   LAB RESULTS:  Recent Labs  02/27/14 2022 02/28/14 0113 02/28/14 0541  WBC 12.4* 9.4 6.9  HGB 8.5* 8.0* 6.5*  HCT 26.7* 24.9* 20.1*  PLT 420* 281 264   BMET Lab Results  Component Value Date   NA 138 02/28/2014   NA 137 02/28/2014   NA 140 02/27/2014   K 4.9 02/28/2014   K 5.0 02/28/2014   K 5.0 02/27/2014   CL 100 02/28/2014   CL 100 02/28/2014   CL 102 02/27/2014   CO2 26 02/28/2014   CO2 24 02/28/2014   CO2 24 02/27/2014   GLUCOSE 73 02/28/2014   GLUCOSE 75 02/28/2014   GLUCOSE 153* 02/27/2014   BUN 23 02/28/2014   BUN 24* 02/28/2014   BUN 23 02/27/2014   CREATININE 7.64* 02/28/2014   CREATININE 7.56* 02/28/2014   CREATININE 7.21* 02/27/2014   CALCIUM 8.2* 02/28/2014   CALCIUM 8.1* 02/28/2014   CALCIUM 8.5 02/27/2014   LFT  Recent Labs  02/27/14 2022  PROT 7.0  ALBUMIN 2.1*  AST 19  ALT 10  ALKPHOS 92  BILITOT 1.2   PT/INR Lab Results  Component Value Date   INR 1.72* 02/28/2014   INR 7.29* 02/27/2014   INR 3.75* 02/21/2014   Hepatitis Panel No results found for this basename: HEPBSAG, HCVAB, HEPAIGM, HEPBIGM,  in the last 72 hours C-Diff No components found with this basename: cdiff   Lipase     Component Value Date/Time   LIPASE 62* 02/27/2014 2022    Drugs of Abuse  No results found for this basename: labopia, cocainscrnur, labbenz, amphetmu, thcu, labbarb     RADIOLOGY  STUDIES: Ct Abdomen Pelvis W Contrast  02/27/2014   CLINICAL DATA:  Rectal bleeding.  EXAM: CT ABDOMEN AND PELVIS WITH CONTRAST  TECHNIQUE: Multidetector CT imaging of the abdomen and pelvis was performed using the standard protocol following bolus administration of intravenous contrast.  CONTRAST:  72mL OMNIPAQUE IOHEXOL 300 MG/ML SOLN, 161mL OMNIPAQUE IOHEXOL 300 MG/ML SOLN  COMPARISON:  CT 02/13/2014.  Plain films 02/19/2014.  FINDINGS: Moderate to large right pleural effusion, increased in size since prior study. There is also areas of increased density now within the right pleural space. If compressive atelectasis in the right lower lobe.  Small nodule  in the left lower lobe posteriorly measures 5 mm. This is stable dating back to prior chest CT from 2013 hands compatible with benign nodule.  Liver, spleen, pancreas, adrenals are unremarkable. Kidneys are mildly atrophic with scattered small low-density areas, likely cysts. Bilateral perinephric stranding. No hydronephrosis.  Small gallstones layering within the gallbladder.  Stomach and small bowel are unremarkable. No evidence of small bowel obstruction currently. Descending colonic diverticulosis. No active diverticulitis. Again noted is mottled gas in the region of the cecal tip as seen on prior study, not significantly changed. This presumably represents pneumatosis within the cecal wall. This could be benign pneumatosis or related to ischemia. Appendix is visualized and is borderline dilated at 9 mm. No definite surrounding inflammatory process.  No free fluid, free air or adenopathy. Prostate is mildly enlarged. Seminal vesicles are calcified. Urinary bladder is decompressed.  Aorta and iliac vessels are heavily calcified as are the mesenteric vessels and renal arteries.  Right inguinal hernia containing fat, unchanged.  IMPRESSION: Continued mottled gas in the region of the cecal tip, presumably pneumatosis. This could be benign or related to ischemia.   Borderline enlarged appendix at 9 mm without surrounding inflammatory change.  Cholelithiasis.  Complex right pleural effusion, now with areas of increased soft tissue density throughout the right pleural space. This may be related to blood/hemothorax.  These results were called by telephone at the time of interpretation on 02/27/2014 at 10:05 PM to Dr. Francine Graven , who verbally acknowledged these results.   Electronically Signed   By: Rolm Baptise M.D.   On: 02/27/2014 22:05    ENDOSCOPIC STUDIES: 05/2011  Colonoscopy   Multiple adenomatous polyps, int rrhoids, diverticulosis.   01/2011  EGD 1. Normal esophagus without evidence of Barrett mass, erosion,  ulceration, or stricture.  2. Multiple ulcerated polypoid lesions in the stomach (antrum). He  had a pink-colored gastric fluid suggesting previous GI bleeding  which was now resolved. No fresh blood or old blood seen in the  stomach. Three polyps were removed via snare cautery (25 watts).  3. Normal duodenal bulb and second portion of the duodenum. Normal  ampulla. No old blood or fresh blood seen in the duodenum. DIAGNOSES: Melena most likely secondary to ulcerated gastric lesion in  the setting of anticoagulation. The patient most likely has H. pylori  gastritis. Biopsies are pending. Pathology:  Chronic gastritis and metaplasia.  RECOMMENDATIONS:  1. Hold aspirin for 14 days.  2. Hold anticoagulation for 7 days.  3. B.i.d. PPI for 3 months then daily.  4. The patient should have an outpatient colonoscopy.  5. We will advance his diet. If his hemoglobin remained stable and he  remains without melena, we will discharge to home on tomorrow.    IMPRESSION:   *  Painless hematochezia in pt with markedly elevated INR.  Bleeding slowing with correction of INR.  Suspect Diverticular bleed.   *  SBO and CDiff in 3rd week June 2015.  Had not yet completed course of oral Flagyl.  Diarrhea improved but not completely resolved or back to  bsseline stool habits.   *  Chronic Coumadin for several indications.  Afib, CVA.  Valve is bioprosthetic, thus not Coumadin requiring.   *  Multiple adenomatous colon polyps 06/2011.  Due for repeat screening 05/2014.  Followed closely by Dr Oneida Alar.  Also hx of gastric polyps and associated ulceration of polyps.  Led to GI bleeding in 6/ 2012. .   *  ESRD x 7 or  so years.     PLAN:     *  Per Dr Henrene Pastor *  Transfuse one more PRBc as planned with HD today. *  Ok to have clears. *  Hold Coumadin as doing.    Azucena Freed  02/28/2014, 11:22 AM Pager: [phone]  GI ATTENDING  History, laboratories, x-rays, outside endoscopy reports reviewed. Patient per se seen and examined. Agree with H&P as above. Medically complicated gentleman who presents with acute lower GI bleeding in the face of over anticoagulation. He has been hemodynamically stable. Bleeding has slowed with correction of coagulopathy. Given The most recent colonoscopy, this is most likely diverticular bleed. Recommend supportive care. Transfusion to maintain hemoglobin at 8 or above. Continue to monitor stools and vital signs. No plans for endoscopic evaluations at this time. We'll follow  Docia Chuck. Geri Seminole., M.D. [facility] Division of Gastroenterology

## 2014-02-28 NOTE — Consult Note (Signed)
Renal Service Consult Note [place] 02/28/2014 [name] [name] D Requesting Physician: Dr Lake Bells   Reason for Consult:  ESRD pt with GIB HPI: The patient is a 63 y.o. year-old with hx of ESRD, HTN, DM2, CABG w MVR, CAF on coumadin, PVD with R AKA presented to ED last night with bloody stools.  Bright red, not high volume. INR was 7.  He rec'd FFP and vit K.  Hb was 6.5.  He has received 3u ffp and 1 unit prbc so far.  He is not bleeding as much here in the hospital and has no complaints.     Patient on HD since 2008 in [place] TTS schedule.   Chart review: Apr 2008-- hx MV prolapse, MR presented w CHF symptoms. New afib and low EF 25%. Felt to need valve replacement and heart cath showed severe 3V CAD so pt had CABG, bioprosthetic MV replacement and closure of PFO.  D/C"d home on coumadin, amio, avapro, others. Also DM, HTN, HL, gout, NSVT June 2012-- UGIB due to multiple ulcerated polypoid lesions in stomach, severe anemia got 3u prbc's.  Polyps removed. Enterococcus bacteremia rx with vanc/gent.  CAF, CM EF 45%, ESRD on HD TTW, DM2 and HTN Nov 2014-- osteo of toe on R foot, ESRD, afib, CM, hx cabg, hx MVR, DM2 > Rx with OP abx at HD Dec 2014-- presented septic w low BP and infected R foot, underwent R BKA and recovered well. CAF, ESRD, CABG, CM Jan 2015-- traumatic dehiscence of R BKA wound, closed by surgery. ESRD, anemia requiring prbc's, DM2, hx cabg/MVR, CM EF 45%, afib on coumadin Feb 2015-- R AKA done for breakdown and poor healing of BK stump, also esrd, afib on coumadin, CM, hx cabg/ mvr Jun 2015-- SBO, prob asp PNA, CDif rx with flagyl, SIRS , CAF, cabg/ MVR  ROS  no CP  no jt pain  no nsaids  ambulates with prosthesis  no ha, no focal weakness  Past Medical History  Past Medical History  Diagnosis Date  . UGI bleed 02/12/11    On anticoagulation; EGD w/snare polypectomy-multiple polypoid lesions antrum(benign), Chronic active gastritis (NEGATIVE  H pylori)  . Anemia, chronic disease   . Sepsis due to enterococcus 02/11/11  . Atrial fibrillation     On coumadin  . Coronary atherosclerosis of native coronary artery     Multivessel status post CABG  . Cardiomyopathy     LVEF 40-45% 01/2011  . Type 2 diabetes mellitus   . Essential hypertension, benign   . Hyperlipemia   . Gout   . S/P patent foramen ovale closure   . ESRD on hemodialysis     Started dialysis sometime around 2008-2009.  Gets HD in [place], [place] with Dr Hinda Lenis on a TTS schedule.  Runs 3.5 hours with dry wt 86.5 kg as of June 2015.  Had a L arm AVF which didn't work and has been using his R forearm AVF for several years.    . History of nuclear stress test 07/04/2010    dipyridamole; mod fixed inferolateral defect; non-diagnostic for ischemia; low risk scan   . Hyperbilirubinemia 08/10/2013  . SBO (small bowel obstruction) 01/2014  . Pneumatosis coli 01/2014    of cecum  . Clostridium difficile colitis 01/2014  . Chronic anticoagulation     coumadin  . Diverticulosis   . Internal hemorrhoids    Past Surgical History  Past Surgical History  Procedure Laterality Date  . Mitral valve replacement  01/05/2007    Bioprosthetic 88mm Edwards precordial tissue (Dr. Servando Snare)  . Coronary artery bypass graft  01/05/2007    LIMA-LAD, SVG-OM, seq. SVG-PDA, PLA-RCA  . Colonoscopy  06/23/2011    Dr. Oneida Alar: multiple sessile and pedunculated polyps, moderate diverticulosis, internal hemorrhoids, +ADENOMATOUS POLYPS, consider genetic testing, surveillance in October 2015   . Esophagogastroduodenoscopy  02/12/2011    CHRONIC ACTIVE GASTRITIS, NO H. pylori  . Transthoracic echocardiogram  11/2011    EF 25% w/ mod dilatation of LV & mod LVH; LA severely dilated; mod-severe dilation of RV with mod-severe decrease in RV function; mild MR & TR  . Cardioversion  04/19/2007    Dr. Marella Chimes  . Transthoracic echocardiogram  02/14/2011    EF 40-45%, mod conc LVH; ventricular septum with  motion showing abnormal function, dyssynergy, paradox; mild AS; mild-mod MR stenosis; LA severely dilated; aptrial septum bowed from L to R with increased LA pressure   . Amputation Right 07/14/2013    Procedure: AMPUTATION DIGIT;  Surgeon: [name], MD;  Location: [facility];  Service: General;  Laterality: Right;  . Amputation Right 08/08/2013    Procedure: AMPUTATION BELOW KNEE;  Surgeon: [name], MD;  Location: [facility];  Service: General;  Laterality: Right;  . Stump revision Right 09/19/2013    Procedure: STUMP REVISION;  Surgeon: Scherry Ran, MD;  Location: [facility];  Service: General;  Laterality: Right;  . Amputation Right 10/17/2013    Procedure: AMPUTATION ABOVE KNEE;  Surgeon: [name], MD;  Location: [facility];  Service: General;  Laterality: Right;   Family History  Family History  Problem Relation Age of Onset  . Diabetes Mother   . Diabetes Father   . Diabetes Sister   . Colon cancer Neg Hx    Social History  reports that he quit smoking about 7 years ago. His smoking use included Cigarettes. He has a 8 pack-year smoking history. He has never used smokeless tobacco. He reports that he does not drink alcohol or use illicit drugs. Allergies  Allergies  Allergen Reactions  . Bacitracin Other (See Comments)    unknown  . Norvasc [Amlodipine Besylate] Other (See Comments)    unknown   Home medications Prior to Admission medications   Medication Sig Start Date End Date Taking? Authorizing Provider  albuterol (PROVENTIL) (2.5 MG/3ML) 0.083% nebulizer solution Take 3 mLs (2.5 mg total) by nebulization every 2 (two) hours as needed for wheezing. 02/21/14   Kinnie Feil, MD  allopurinol (ZYLOPRIM) 100 MG tablet Take 100 mg by mouth daily.  05/15/11   Historical Provider, MD  amiodarone (PACERONE) 100 MG tablet Take 100 mg by mouth daily.    Historical Provider, MD  aspirin EC 81 MG tablet Take 81 mg by mouth every evening.    Historical Provider, MD   atorvastatin (LIPITOR) 20 MG tablet Take 20 mg by mouth every evening.  02/07/13   Rebecca Eaton, MD  B Complex-C-Zn-Folic Acid (DIALYVITE/ZINC PO) Take by mouth 3 (three) times a week. Patient has dialysis on Tuesdays, Thursdays, and Saturdays    Historical Provider, MD  folic acid (FOLVITE) 1 MG tablet Take 1 mg by mouth daily.      Historical Provider, MD  insulin glargine (LANTUS) 100 UNIT/ML injection Inject 10 Units into the skin at bedtime.    Historical Provider, MD  metoprolol tartrate (LOPRESSOR) 25 MG tablet Take 1 tablet (25 mg total) by mouth 2 (two) times daily. 02/21/14   Alicia Amel  Wynonia Lawman, MD  metroNIDAZOLE (FLAGYL) 500 MG tablet Take 1 tablet (500 mg total) by mouth 3 (three) times daily. 02/21/14   Kinnie Feil, MD  midodrine (PROAMATINE) 5 MG tablet Take 1 tablet (5 mg total) by mouth 3 (three) times daily before meals. 02/21/14   Kinnie Feil, MD  pantoprazole (PROTONIX) 40 MG tablet Take 40 mg by mouth 2 (two) times daily.  05/25/11   Historical Provider, MD  RENVELA 800 MG tablet Take 800 mg by mouth 3 (three) times daily with meals.  05/03/11   Historical Provider, MD  SENSIPAR 30 MG tablet Take 30 mg by mouth daily. 01/22/14   Historical Provider, MD  traMADol (ULTRAM) 50 MG tablet Take by mouth every 8 (eight) hours as needed for moderate pain.    Historical Provider, MD  traZODone (DESYREL) 50 MG tablet Take 1 tablet (50 mg total) by mouth at bedtime. 08/13/13   Nita Sells, MD  warfarin (COUMADIN) 2.5 MG tablet Take 0.5-1 tablets (1.25-2.5 mg total) by mouth See admin instructions. Take 1.25 mg (1/2 tab) daily EXCEPT for 2.5 mg (1 tablet) on Wednesdays only 02/23/14   Kinnie Feil, MD   Liver Function Tests  Recent Labs Lab 02/27/14 2022  AST 19  ALT 10  ALKPHOS 92  BILITOT 1.2  PROT 7.0  ALBUMIN 2.1*    Recent Labs Lab 02/27/14 2022  LIPASE 62*   CBC  Recent Labs Lab 02/27/14 2022 02/28/14 0113 02/28/14 0541  WBC 12.4* 9.4 6.9   NEUTROABS 9.6*  --   --   HGB 8.5* 8.0* 6.5*  HCT 26.7* 24.9* 20.1*  MCV 94.7 95.0 94.8  PLT 420* 281 093   Basic Metabolic Panel  Recent Labs Lab 02/27/14 2022 02/28/14 0541 02/28/14 0650  NA 140 137 138  K 5.0 5.0 4.9  CL 102 100 100  CO2 24 24 26   GLUCOSE 153* 75 73  BUN 23 24* 23  CREATININE 7.21* 7.56* 7.64*  CALCIUM 8.5 8.1* 8.2*    Filed Vitals:   02/28/14 0900 02/28/14 0915 02/28/14 0930 02/28/14 0945  BP: 100/66 102/60 104/56   Pulse: 75 77 71 70  Temp: 97.6 F (36.4 C) 98 F (36.7 C)    TempSrc: Oral Oral    Resp: 14 18 13  0  Height:      Weight:      SpO2: 100% 100% 100% 100%   Exam Alert, no distress, calm No rash, cyanosis or gangrene Sclera anicteric, throat clear No jvd Chest clear bilat RRR no MRG, sternal scars healed Abd soft, NTND, no ascites No LE edema, R AKA R forearm AVF patent  HD: TTS [facility] 3.5h   86.5kg   2/2.25 [place]   Heparin none  RFA AVF Hectorol 1 mcg TIW, Venofer 50mg  q Tues   Assessment: 1 Gastrointestinal bleed / anemia of ABL- getting prbc's, s/p FFP for ^INR 2 ESRD on HD 3 Afib on coumadin, amio, BB 4 Chronic hypotension on midodrine 5 DM on insulin 6 HPTH cont meds (vit D, binder, sensipar) 7 Hx CABG / bioprosthetic MVR '08 8 CM EF 40%   Plan- HD today, UF 2kg to dry wt  Kelly Splinter MD (pgr) [phone]    (c) [phone] 02/28/2014, 10:00 AM

## 2014-02-28 NOTE — Progress Notes (Signed)
Hypoglycemic Event  CBG: 67  Treatment: 15 GM carbohydrate snack   Symptoms: None  Follow-up CBG: Time:2215 CBG Result:70  Possible Reasons for Event: Inadequate meal intake  Comments/MD notified:Per protocol; had not finished juice.  Now has finished.  Will follow up in 15 min    [place] ANN  Remember to initiate Hypoglycemia Order Set & complete

## 2014-03-01 ENCOUNTER — Inpatient Hospital Stay (HOSPITAL_COMMUNITY): Payer: Medicare Other

## 2014-03-01 DIAGNOSIS — I10 Essential (primary) hypertension: Secondary | ICD-10-CM

## 2014-03-01 DIAGNOSIS — E785 Hyperlipidemia, unspecified: Secondary | ICD-10-CM

## 2014-03-01 DIAGNOSIS — K5731 Diverticulosis of large intestine without perforation or abscess with bleeding: Principal | ICD-10-CM

## 2014-03-01 DIAGNOSIS — I509 Heart failure, unspecified: Secondary | ICD-10-CM

## 2014-03-01 DIAGNOSIS — E118 Type 2 diabetes mellitus with unspecified complications: Secondary | ICD-10-CM

## 2014-03-01 DIAGNOSIS — IMO0002 Reserved for concepts with insufficient information to code with codable children: Secondary | ICD-10-CM

## 2014-03-01 DIAGNOSIS — E1165 Type 2 diabetes mellitus with hyperglycemia: Secondary | ICD-10-CM

## 2014-03-01 DIAGNOSIS — I5042 Chronic combined systolic (congestive) and diastolic (congestive) heart failure: Secondary | ICD-10-CM

## 2014-03-01 LAB — GLUCOSE, CAPILLARY
GLUCOSE-CAPILLARY: 79 mg/dL (ref 70–99)
Glucose-Capillary: 144 mg/dL — ABNORMAL HIGH (ref 70–99)
Glucose-Capillary: 153 mg/dL — ABNORMAL HIGH (ref 70–99)
Glucose-Capillary: 93 mg/dL (ref 70–99)
Glucose-Capillary: 98 mg/dL (ref 70–99)

## 2014-03-01 LAB — CBC
HEMATOCRIT: 23.4 % — AB (ref 39.0–52.0)
HEMATOCRIT: 27.1 % — AB (ref 39.0–52.0)
Hemoglobin: 7.5 g/dL — ABNORMAL LOW (ref 13.0–17.0)
Hemoglobin: 8.9 g/dL — ABNORMAL LOW (ref 13.0–17.0)
MCH: 29.9 pg (ref 26.0–34.0)
MCH: 29.4 pg (ref 26.0–34.0)
MCHC: 32.1 g/dL (ref 30.0–36.0)
MCHC: 32.8 g/dL (ref 30.0–36.0)
MCV: 93.2 fL (ref 78.0–100.0)
MCV: 89.4 fL (ref 78.0–100.0)
PLATELETS: 260 10*3/uL (ref 150–400)
Platelets: 262 10*3/uL (ref 150–400)
RBC: 2.51 MIL/uL — ABNORMAL LOW (ref 4.22–5.81)
RBC: 3.03 MIL/uL — ABNORMAL LOW (ref 4.22–5.81)
RDW: 17.2 % — AB (ref 11.5–15.5)
RDW: 16.4 % — AB (ref 11.5–15.5)
WBC: 6 10*3/uL (ref 4.0–10.5)
WBC: 5.8 10*3/uL (ref 4.0–10.5)

## 2014-03-01 LAB — BASIC METABOLIC PANEL
Anion gap: 15 (ref 5–15)
BUN: 30 mg/dL — AB (ref 6–23)
CHLORIDE: 97 meq/L (ref 96–112)
CO2: 23 mEq/L (ref 19–32)
CREATININE: 8.93 mg/dL — AB (ref 0.50–1.35)
Calcium: 7.8 mg/dL — ABNORMAL LOW (ref 8.4–10.5)
GFR calc Af Amer: 6 mL/min — ABNORMAL LOW (ref >90)
GFR calc non Af Amer: 5 mL/min — ABNORMAL LOW (ref >90)
Glucose, Bld: 130 mg/dL — ABNORMAL HIGH (ref 70–99)
Potassium: 5.4 mEq/L — ABNORMAL HIGH (ref 3.7–5.3)
Sodium: 135 mEq/L — ABNORMAL LOW (ref 137–147)

## 2014-03-01 LAB — PREPARE FRESH FROZEN PLASMA
Unit division: 0
Unit division: 0

## 2014-03-01 LAB — PROTIME-INR
INR: 1.48 (ref 0.00–1.49)
Prothrombin Time: 17.9 seconds — ABNORMAL HIGH (ref 11.6–15.2)

## 2014-03-01 MED ORDER — DARBEPOETIN ALFA-POLYSORBATE 25 MCG/0.42ML IJ SOLN
25.0000 ug | Freq: Once | INTRAMUSCULAR | Status: DC
  Filled 2014-03-01: qty 0.42

## 2014-03-01 MED ORDER — DARBEPOETIN ALFA-POLYSORBATE 25 MCG/0.42ML IJ SOLN
25.0000 ug | Freq: Once | INTRAMUSCULAR | Status: AC
  Administered 2014-03-01: 25 ug via SUBCUTANEOUS
  Filled 2014-03-01: qty 0.42

## 2014-03-01 NOTE — Progress Notes (Signed)
Daily Rounding Note  03/01/2014, 9:30 AM  LOS: 2 days   SUBJECTIVE:       On clear liquids.  RN says small BM this AM was brown, not grossly bloody. Pt without abdominal pain and no nausea. Tolerating clears.  OBJECTIVE:         Vital signs in last 24 hours:    Temp:  [98 F (36.7 C)-98.4 F (36.9 C)] 98 F (36.7 C) (07/05 0902) Pulse Rate:  [64-88] 81 (07/05 0902) Resp:  [13-27] 20 (07/05 0902) BP: (91-122)/(42-85) 112/57 mmHg (07/05 0902) SpO2:  [96 %-100 %] 98 % (07/05 0902) Weight:  [89.5 kg (197 lb 5 oz)-89.8 kg (197 lb 15.6 oz)] 89.5 kg (197 lb 5 oz) (07/05 0902) Last BM Date: 03/01/14 General: looks well, comfortable Heart: RRR Chest: clear bil.  Unlabored breathing Abdomen: soft, NT, ND.  Active BS  Extremities: no CCE Neuro/Psych:  Pleasant, alert/oriented x 3.  No limb weakness.   Intake/Output from previous day: 07/04 0701 - 07/05 0700 In: 1170.8 [P.O.:840; Blood:330.8] Out: -   Intake/Output this shift:    Lab Results:  Recent Labs  02/28/14 0541 02/28/14 1710 03/01/14 0220  WBC 6.9 7.3 6.0  HGB 6.5* 8.2* 7.5*  HCT 20.1* 25.6* 23.4*  PLT 264 308 262   BMET  Recent Labs  02/28/14 0541 02/28/14 0650 03/01/14 0220  NA 137 138 135*  K 5.0 4.9 5.4*  CL 100 100 97  CO2 24 26 23   GLUCOSE 75 73 130*  BUN 24* 23 30*  CREATININE 7.56* 7.64* 8.93*  CALCIUM 8.1* 8.2* 7.8*   LFT  Recent Labs  02/27/14 2022  PROT 7.0  ALBUMIN 2.1*  AST 19  ALT 10  ALKPHOS 92  BILITOT 1.2   PT/INR  Recent Labs  02/28/14 1710 03/01/14 0220  LABPROT 17.6* 17.9*  INR 1.45 1.48    Studies/Results: Ct Abdomen Pelvis W Contrast 02/27/2014  FINDINGS: Moderate to large right pleural effusion, increased in size since prior study. There is also areas of increased density now within the right pleural space. If compressive atelectasis in the right lower lobe.  Small nodule in the left lower lobe  posteriorly measures 5 mm. This is stable dating back to prior chest CT from 2013 hands compatible with benign nodule.  Liver, spleen, pancreas, adrenals are unremarkable. Kidneys are mildly atrophic with scattered small low-density areas, likely cysts. Bilateral perinephric stranding. No hydronephrosis.  Small gallstones layering within the gallbladder.  Stomach and small bowel are unremarkable. No evidence of small bowel obstruction currently. Descending colonic diverticulosis. No active diverticulitis. Again noted is mottled gas in the region of the cecal tip as seen on prior study, not significantly changed. This presumably represents pneumatosis within the cecal wall. This could be benign pneumatosis or related to ischemia. Appendix is visualized and is borderline dilated at 9 mm. No definite surrounding inflammatory process.  No free fluid, free air or adenopathy. Prostate is mildly enlarged. Seminal vesicles are calcified. Urinary bladder is decompressed.  Aorta and iliac vessels are heavily calcified as are the mesenteric vessels and renal arteries.  Right inguinal hernia containing fat, unchanged.  IMPRESSION: Continued mottled gas in the region of the cecal tip, presumably pneumatosis. This could be benign or related to ischemia.  Borderline enlarged appendix at 9 mm without surrounding inflammatory change.  Cholelithiasis.  Complex right pleural effusion, now with areas of increased soft tissue density throughout the right pleural space.  This may be related to blood/hemothorax.  These results were called by telephone at the time of interpretation on 02/27/2014 at 10:05 PM to Dr. Francine Graven , who verbally acknowledged these results.   Electronically Signed   By: Rolm Baptise M.D.   On: 02/27/2014 22:05   Dg Chest Port 1 View 03/01/2014   CLINICAL DATA:  Airspace disease.  EXAM: PORTABLE CHEST - 1 VIEW  COMPARISON:  One-view chest 02/15/2014.  FINDINGS: The NG tube and left IJ line have been removed.  The heart is enlarged. A a large right pleural effusion is noted. Associated airspace disease with air bronchograms is noted. Diffuse interstitial edema has increased. Mild left basilar airspace disease likely reflects atelectasis.  IMPRESSION: 1. Large right pleural effusion and associated airspace disease. While this may represent atelectasis, infection is not excluded. 2. Cardiomegaly and mild increased edema, concerning for congestive heart failure. 3. Minimal left basilar airspace disease likely reflects atelectasis.   Electronically Signed   By: Lawrence Santiago M.D.   On: 03/01/2014 07:58    ASSESMENT:   * Painless hematochezia in pt with markedly elevated INR. Bleeding slowing with correction of INR. ? Diverticular bleed.  Bleeding has slowed and possibly stopped.   * SBO and CDiff (6/23) in 3rd week June 2015. Had not yet completed course of oral Flagyl which is currently in place. Diarrhea improved but not completely resolved or back to baseline stool habits.  Follow up CT scan of 7/3 shows ongoing mottled gas pattern in area of cecum, presumed pneumatosis. ? Ischemic in nature. Wonder if this is source of hematochezia.  No sxs of SPO, no abdominal pain.  *  Anemia. Normocytic.  Current Hgb within historic range. Got 1 unit PRBC on 7/4.   * Chronic Coumadin for several indications. Afib, CVA. Valve is bioprosthetic, thus not Coumadin requiring.  Coagulopathy: corrected with FFP x 4, Vitamin K 02/27/14.   * Multiple adenomatous colon polyps 06/2011. Due for repeat screening 05/2014. Followed closely by Dr Oneida Alar.  Also hx of gastric polyps and associated ulceration of polyps. Led to GI bleeding in 6/ 2012.    * ESRD x 7 or so years. HD TTS but missed Sat so dialysis in progress today Sunday.     PLAN   *  Advance diet *  Does surgeon need to eyeball the CT scan?  *  When to stop the Flagyl? Which is ~ day 12 currently.  *  Outpt follow up with Dr Barney Drain.     Azucena Freed   03/01/2014, 9:30 AM Pager: [phone]  GI ATTENDING  Interval history and data reviewed. Patient personally seen and examined while in dialysis. Agree with H&P as above. No further bleeding. Resume self-limited diverticular bleed. Receiving transfusion. Hemodynamically stable. Abdominal exam completely benign. RECOMMENDATIONS #1. Advance diet as tolerated #2. Complete 14 day course of metronidazole #3. If no further bleeding, okay to resume Coumadin in one week if felt to be medically important #4. Resume GI care with his primary gastroenterologist Dr. Oneida Alar. We're available as needed while he is in this hospital. Thank you  Docia Chuck. Geri Seminole., M.D. [facility] Division of Gastroenterology

## 2014-03-01 NOTE — Procedures (Signed)
I was present at this dialysis session, have reviewed the session itself and made  appropriate changes  Kelly Splinter MD (pgr) [phone]    [phone] 03/01/2014, 10:14 AM

## 2014-03-01 NOTE — Progress Notes (Signed)
  [facility] Progress Note   Subjective: Getting HD today due to full schedule yesterday.  Getting prbc'sx 1 today  Filed Vitals:   03/01/14 0932 03/01/14 0945 03/01/14 1012 03/01/14 1046  BP: 119/70 117/72 123/74 115/73  Pulse: 80 77 76 82  Temp: 98.2 F (36.8 C) 98.2 F (36.8 C) 97.7 F (36.5 C)   TempSrc: Oral Oral Oral   Resp:  20 20 18   Height:      Weight:      SpO2: 98% 98%     Exam: Alert, no distress, calm No jvd  Chest clear bilat  RRR no MRG, sternal scars healed  Abd soft, NTND, no ascites  No LE edema, R AKA  R forearm AVF patent   HD: TTS [facility]  3.5h 86.5kg 2/2.25 [place] Heparin none RFA AVF  Hectorol 1 mcg TIW, Venofer 50mg  q Tues   Assessment:  1 Gastrointestinal bleed / recent colonoscopy w polyp removal -- GI evaluating 2 Anemia of ABL+CKD -- transfusion again today w HD, starting aranesp 3 ESRD on HD  4 Afib on amio, BB, coumadin on hold 5 ^INR- better after FFP 6 Chronic hypotension on midodrine  7 DM on insulin  8 HPTH cont meds (vit D, binder, sensipar)  9 Hx CABG / bioprosthetic MVR '08  0 CM EF 40% 10 Recent SBO and Cdif in June 2015  Plan- HD today w prbc's, start Aranesp 25 / wk    Kelly Splinter MD  pager [phone]    cell [phone]  03/01/2014, 11:03 AM     Recent Labs Lab 02/28/14 0541 02/28/14 0650 03/01/14 0220  NA 137 138 135*  K 5.0 4.9 5.4*  CL 100 100 97  CO2 24 26 23   GLUCOSE 75 73 130*  BUN 24* 23 30*  CREATININE 7.56* 7.64* 8.93*  CALCIUM 8.1* 8.2* 7.8*    Recent Labs Lab 02/27/14 2022  AST 19  ALT 10  ALKPHOS 92  BILITOT 1.2  PROT 7.0  ALBUMIN 2.1*    Recent Labs Lab 02/27/14 2022  02/28/14 0541 02/28/14 1710 03/01/14 0220  WBC 12.4*  < > 6.9 7.3 6.0  NEUTROABS 9.6*  --   --   --   --   HGB 8.5*  < > 6.5* 8.2* 7.5*  HCT 26.7*  < > 20.1* 25.6* 23.4*  MCV 94.7  < > 94.8 91.4 93.2  PLT 420*  < > 264 308 262  < > = values in this interval not displayed. Marland Kitchen allopurinol  100 mg  Oral Daily  . amiodarone  100 mg Oral Daily  . atorvastatin  20 mg Oral QPM  . cinacalcet  30 mg Oral Q breakfast  . feeding supplement (RESOURCE BREEZE)  1 Container Oral TID BM  . folic acid  1 mg Oral Daily  . insulin aspart  0-15 Units Subcutaneous TID WC  . insulin aspart  0-5 Units Subcutaneous QHS  . metoprolol tartrate  12.5 mg Oral BID  . metroNIDAZOLE  500 mg Oral 3 times per day  . midodrine  5 mg Oral TID AC  . pantoprazole (PROTONIX) IV  40 mg Intravenous Q12H  . sevelamer carbonate  800 mg Oral TID WC  . traZODone  50 mg Oral QHS     sodium chloride, albuterol

## 2014-03-01 NOTE — Progress Notes (Signed)
[place] TEAM 1 - Stepdown/ICU TEAM Progress Note  [name] [name] CVE:[id number] DOB: Sep 10, 1950 DOA: 02/27/2014 PCP: [name], MD  Admit HPI / Brief Narrative: 63 y/o male with PMH including ESRD on HD T/Th/Sat, A. Fib on coumadin, HTN, CHF, cardiomyopathy LVEF of 15 to 20%, DM Type 2, h/o UGI bleed recently discharged on 02/21/14 after treatment for SBO and small area of cecal pneumatosis. Transferred from [facility] to [facility] for LGIB in setting of INR = 7.  Recently admitted with c.diff, partial SBO and cecal pneumatosis. Resolved with medical therapy. Discharged home on flagyl. Was doing well until this evening when he developed several episodes of BRBPR. Said he had formed stool but toilet was filled up with BRB. Denies ab pain, diarrhea, N/V, fevers, chills. Has not been taking any NSAIDs  Went to [facility] ER. INR found to have INR 7.29. Hgb 8.5 (was 9.1 on 6/27). BP 87/49. Given 500cc NS and 1 u FFP and 1mg  vit k. On arrival had another episode of BRBPR.  According to records had colonoscopy 10/12: multiple sessile and pedunculated polyps, moderate diverticulosis, internal hemorrhoids, +ADENOMATOUS POLYPS. Was due for f/u this year.  EGD 6/12: On anticoagulation; EGD w/snare polypectomy-multiple polypoid lesions antrum(benign), Chronic active gastritis (NEGATIVE H pylori)  HPI/Subjective: 7/5 A/O. x4, NAD  Assessment/Plan: GI bleed -Stable hemoglobin -Continue him on a closely -7/42 units FFP -7/4 1 unit PRBC -7/5 1 unit PRBC    ESRD on HD T/TH/Sat -HD per nephrology  A. fib (on Coumadin) -Coumadin on hold secondary to GI bleed  Combined systolic and diastolic CHF -Continue Metoprolol 12.5 mg BID  HTN  -See combined systolic/diastolic CHF  HLD -Obtain lipid panel  Diabetes type 2 -Continue moderate SSI -Obtain hemoglobin A1c     Code Status: FULL Family Communication: no family present at time of exam Disposition Plan: ??    Consultants: Dr. Roney Jaffe  (nephrology) Dr. Scarlette Shorts (GI)   Procedure/Significant Events: 4/29 echocardiogram  - Left ventricle: mildly dilated. Mild LVH. LVEF= EF 15-20%. Severe global hypokinesis   -Doppler parameters consistentwith a reversible restrictive pattern -(grade 3 diastolic dysfunction). - Left atrium: severely dilated. - Right ventricle: mildly dilated. - Right atrium: The atrium was mildly dilated. 01/2011 - EGD On anticoagulation; EGD w/snare polypectomy-multiple polypoid lesions antrum (benign), Chronic active gastritis (NEGATIVE H pylori)  ..............................................................  7/4 admitted to ICU from [facility] for LGIB. 3 units FFP on 7/4, 1 unit PRBC 7/4, 1 unit PRBC 7/5, + Vit K    Culture 7/3 C-Diff  >> POSITIVE   Antibiotics: Flagyl (C.diff ) 6/23 >> stopped date 7/8 per GI will be a complete 14 day course  DVT prophylaxis: SCD   Devices NA   LINES / TUBES:  ?? Right forearm AV fistula 7/3 20ga left arm 7/4 22ga left wrist     Continuous Infusions:   Objective: VITAL SIGNS: Temp: 98.1 F (36.7 C) (07/05 1557) Temp src: Oral (07/05 1557) BP: 112/62 mmHg (07/05 1500) Pulse Rate: 79 (07/05 1500) SPO2; FIO2:   Intake/Output Summary (Last 24 hours) at 03/01/14 1641 Last data filed at 03/01/14 1500  Gross per 24 hour  Intake   1020 ml  Output   2000 ml  Net   -980 ml     Exam: General: A./O. x4, NAD No acute respiratory distress Lungs: Clear to auscultation bilaterally without wheezes or crackles Cardiovascular: Regular rate and rhythm without murmur gallop or rub normal S1 and S2 Abdomen: Nontender, nondistended, soft, bowel  sounds positive, no rebound, no ascites, no appreciable mass Extremities: No significant cyanosis, clubbing, or edema bilateral lower extremities. Right arm AV fistula covered and clean  Data Reviewed: Basic Metabolic Panel:  Recent Labs Lab 02/27/14 2022 02/28/14 0541 02/28/14 0650 03/01/14 0220  NA 140  137 138 135*  K 5.0 5.0 4.9 5.4*  CL 102 100 100 97  CO2 24 24 26 23   GLUCOSE 153* 75 73 130*  BUN 23 24* 23 30*  CREATININE 7.21* 7.56* 7.64* 8.93*  CALCIUM 8.5 8.1* 8.2* 7.8*   Liver Function Tests:  Recent Labs Lab 02/27/14 2022  AST 19  ALT 10  ALKPHOS 92  BILITOT 1.2  PROT 7.0  ALBUMIN 2.1*    Recent Labs Lab 02/27/14 2022  LIPASE 62*   No results found for this basename: AMMONIA,  in the last 168 hours CBC:  Recent Labs Lab 02/27/14 2022 02/28/14 0113 02/28/14 0541 02/28/14 1710 03/01/14 0220 03/01/14 1255  WBC 12.4* 9.4 6.9 7.3 6.0 5.8  NEUTROABS 9.6*  --   --   --   --   --   HGB 8.5* 8.0* 6.5* 8.2* 7.5* 8.9*  HCT 26.7* 24.9* 20.1* 25.6* 23.4* 27.1*  MCV 94.7 95.0 94.8 91.4 93.2 89.4  PLT 420* 281 264 308 262 260   Cardiac Enzymes:  Recent Labs Lab 02/28/14 0542 02/28/14 1710 02/28/14 2117  TROPONINI <0.30 <0.30 <0.30   BNP (last 3 results) No results found for this basename: PROBNP,  in the last 8760 hours CBG:  Recent Labs Lab 02/28/14 2157 02/28/14 2221 02/28/14 2344 03/01/14 0706 03/01/14 0950  GLUCAP 67* 70 153* 79 98    Recent Results (from the past 240 hour(s))  CLOSTRIDIUM DIFFICILE BY PCR     Status: Abnormal   Collection Time    02/27/14  9:22 PM      Result Value Ref Range Status   C difficile by pcr POSITIVE (*) NEGATIVE Final   Comment: CRITICAL RESULT CALLED TO, READ BACK BY AND VERIFIED WITH:     B.NORMAN AT 2247 ON 02/27/14 BY S.VANHOORNE  MRSA PCR SCREENING     Status: None   Collection Time    02/28/14 12:34 AM      Result Value Ref Range Status   MRSA by PCR NEGATIVE  NEGATIVE Final   Comment:            The GeneXpert MRSA Assay (FDA     approved for NASAL specimens     only), is one component of a     comprehensive MRSA colonization     surveillance program. It is not     intended to diagnose MRSA     infection nor to guide or     monitor treatment for     MRSA infections.     Studies:  Recent  x-ray studies have been reviewed in detail by the Attending Physician  Scheduled Meds:  Scheduled Meds: . allopurinol  100 mg Oral Daily  . amiodarone  100 mg Oral Daily  . atorvastatin  20 mg Oral QPM  . cinacalcet  30 mg Oral Q breakfast  . feeding supplement (RESOURCE BREEZE)  1 Container Oral TID BM  . folic acid  1 mg Oral Daily  . insulin aspart  0-15 Units Subcutaneous TID WC  . insulin aspart  0-5 Units Subcutaneous QHS  . metoprolol tartrate  12.5 mg Oral BID  . metroNIDAZOLE  500 mg Oral 3 times per day  .  midodrine  5 mg Oral TID AC  . pantoprazole (PROTONIX) IV  40 mg Intravenous Q12H  . sevelamer carbonate  800 mg Oral TID WC  . traZODone  50 mg Oral QHS    Time spent on care of this patient: 40 mins   Allie Bossier , MD   [facility] Office  [phone] Pager - [phone]  On-Call/Text Page:      [email or web address]      password [email or web address]  If 7PM-7AM, please contact night-coverage [email or web address] Password [email or web address] 03/01/2014, 4:41 PM   LOS: 2 days

## 2014-03-02 LAB — CBC
HCT: 26.1 % — ABNORMAL LOW (ref 39.0–52.0)
Hemoglobin: 8.4 g/dL — ABNORMAL LOW (ref 13.0–17.0)
MCH: 29.3 pg (ref 26.0–34.0)
MCHC: 32.2 g/dL (ref 30.0–36.0)
MCV: 90.9 fL (ref 78.0–100.0)
PLATELETS: 264 10*3/uL (ref 150–400)
RBC: 2.87 MIL/uL — ABNORMAL LOW (ref 4.22–5.81)
RDW: 16.4 % — AB (ref 11.5–15.5)
WBC: 5.7 10*3/uL (ref 4.0–10.5)

## 2014-03-02 LAB — HEMOGLOBIN A1C
Hgb A1c MFr Bld: 5.9 % — ABNORMAL HIGH (ref <5.7)
MEAN PLASMA GLUCOSE: 123 mg/dL — AB (ref <117)

## 2014-03-02 LAB — TYPE AND SCREEN
ABO/RH(D): O POS
Antibody Screen: NEGATIVE
Unit division: 0
Unit division: 0

## 2014-03-02 LAB — GLUCOSE, CAPILLARY
GLUCOSE-CAPILLARY: 155 mg/dL — AB (ref 70–99)
Glucose-Capillary: 119 mg/dL — ABNORMAL HIGH (ref 70–99)
Glucose-Capillary: 71 mg/dL (ref 70–99)
Glucose-Capillary: 104 mg/dL — ABNORMAL HIGH (ref 70–99)

## 2014-03-02 LAB — LIPID PANEL
CHOL/HDL RATIO: 2.2 ratio
Cholesterol: 60 mg/dL (ref 0–200)
HDL: 27 mg/dL — AB (ref >39)
LDL Cholesterol: 22 mg/dL (ref 0–99)
Triglycerides: 54 mg/dL (ref <150)
VLDL: 11 mg/dL (ref 0–40)

## 2014-03-02 LAB — GI PATHOGEN PANEL BY PCR, STOOL
C difficile toxin A/B: POSITIVE
CRYPTOSPORIDIUM BY PCR: NEGATIVE
Campylobacter by PCR: NEGATIVE
E coli (ETEC) LT/ST: NEGATIVE
E coli (STEC): NEGATIVE
E coli 0157 by PCR: NEGATIVE
G LAMBLIA BY PCR: NEGATIVE
NOROVIRUS G1/G2: NEGATIVE
ROTAVIRUS A BY PCR: NEGATIVE
Salmonella by PCR: NEGATIVE
Shigella by PCR: NEGATIVE

## 2014-03-02 LAB — OCCULT BLOOD, POC DEVICE: Fecal Occult Bld: POSITIVE — AB

## 2014-03-02 LAB — PROTIME-INR
INR: 1.78 — ABNORMAL HIGH (ref 0.00–1.49)
Prothrombin Time: 20.7 seconds — ABNORMAL HIGH (ref 11.6–15.2)

## 2014-03-02 LAB — BASIC METABOLIC PANEL
ANION GAP: 14 (ref 5–15)
BUN: 15 mg/dL (ref 6–23)
CALCIUM: 7.8 mg/dL — AB (ref 8.4–10.5)
CO2: 24 mEq/L (ref 19–32)
CREATININE: 6.14 mg/dL — AB (ref 0.50–1.35)
Chloride: 97 mEq/L (ref 96–112)
GFR calc non Af Amer: 9 mL/min — ABNORMAL LOW (ref >90)
GFR, EST AFRICAN AMERICAN: 10 mL/min — AB (ref >90)
Glucose, Bld: 89 mg/dL (ref 70–99)
Potassium: 4.3 mEq/L (ref 3.7–5.3)
Sodium: 135 mEq/L — ABNORMAL LOW (ref 137–147)

## 2014-03-02 LAB — MAGNESIUM: Magnesium: 1.8 mg/dL (ref 1.5–2.5)

## 2014-03-02 LAB — LACTIC ACID, PLASMA: Lactic Acid, Venous: 1.7 mmol/L (ref 0.5–2.2)

## 2014-03-02 MED ORDER — FAMOTIDINE 20 MG PO TABS
20.0000 mg | ORAL_TABLET | Freq: Every day | ORAL | Status: DC
Start: 2014-03-02 — End: 2014-03-03
  Administered 2014-03-02: 20 mg via ORAL
  Filled 2014-03-02 (×2): qty 1

## 2014-03-02 MED ORDER — FAMOTIDINE 20 MG PO TABS: 20.0000 mg | ORAL_TABLET | Freq: Two times a day (BID) | ORAL | Status: DC

## 2014-03-02 NOTE — Progress Notes (Signed)
[name] [name] TEAM 1 - Stepdown/ICU TEAM Progress Note  [name] [name] JME:[id number] DOB: [birth year] DOA: 02/27/2014 PCP: [name] [name]., MD  Admit HPI / Brief Narrative: 63 y/o male with PMH including ESRD on HD T/Th/Sat, A. Fib on coumadin, HTN, CHF, cardiomyopathy LVEF of 15 to 20%, DM Type 2, h/o UGI bleed discharged on 02/21/14 after treatment for SBO and small area of cecal pneumatosis. Transferred from [facility] to [facility] for LGIB in setting of INR = 7.  Was doing well until evening of his admit when he developed several episodes of BRBPR. Said he had formed stool but toilet was filled up with BRB. Denied ab pain, diarrhea, N/V, fevers, chills. Had not been taking any NSAIDs.  Went to [facility] ER. INR found to be 7.29. Hgb 8.5 (was 9.1 on 6/27). BP 87/49. Given 500cc NS and 1 u FFP and 1mg  vit k. On arrival had another episode of BRBPR.   HPI/Subjective: Has not noted any further bleeding.  Is hungry and ready to try a diet.  No cp, n/v, abdom pain.    Assessment/Plan:  GI bleed / Painless hematochezia / known diverticulosis w/ pneumotosis -most c/w diverticular bleeding in presence of elevated INR  -Stable hemoglobin -7/4 2 units FFP -7/4 1 unit PRBC -7/5 1 unit PRBC  -begin renal diet today - if tolerates without bleeding, will consider d/c home after HD 7/7  Acute blood loss anemia + chronic anemia related to chronic kidney disease  S/p 2U PRBC total - Hgb holding steady at this time - recheck in AM   C diff 02/17/2014  To complete original prescribed 14 day course of flagyl   SBO June 2015 No clinical sx to suggest recurrence  ESRD on HD T/TH/Sat -HD per Nephrology  Chronic A. fib (on Coumadin) -Coumadin on hold secondary to GI bleed - cleared per GI to resume in one week (03/08/14) - rate currently well controlled  Multiple adenomatous colon polyps 06/2011 Due for repeat screening 05/2014 - followed closely by Dr [name] [name]  Combined systolic and diastolic CHF - EF  22-97% -Continue BB - no ACE at present due to borderline low BP - volume management per HD  CAD s/p CABG MVR (biprosthetic)  Asymptomatic   Chronic R pleural effusion  HTN  -well controlled/bordering on hypotension - follow   HLD -cont medical tx   Diabetes type 2 -Continue moderate SSI -A1c pending   Code Status: FULL Family Communication: spoke w/ pt and wife at bedside  Disposition Plan: possible d/c home in AM after HD completed if remains stable over night / tolerates diet   Consultants: Dr. Roney [name] (nephrology) Dr. Scarlette [name] (GI)  Procedure/Significant Events:  4/29 echocardiogram  - Left ventricle: mildly dilated. Mild LVH. LVEF= EF 15-20%. Severe global hypokinesis   -Doppler parameters consistentwith a reversible restrictive pattern -(grade 3 diastolic dysfunction). - Left atrium: severely dilated. - Right ventricle: mildly dilated. - Right atrium: The atrium was mildly dilated.  01/2011 - EGD On anticoagulation; EGD w/snare polypectomy-multiple polypoid lesions antrum (benign), Chronic active gastritis (NEGATIVE H pylori)   Antibiotics: Flagyl (C.diff ) 6/23 >> (stop date 7/8)   DVT prophylaxis: SCD   Objective: VITAL SIGNS: Blood pressure 110/61, pulse 83, temperature 98 F (36.7 C), temperature source Oral, resp. rate 22, height 6\' 4"  (1.93 m), weight 88.9 kg (195 lb 15.8 oz), SpO2 95.00%.  Intake/Output Summary (Last 24 hours) at 03/02/14 1346 Last data filed at 03/02/14 1100  Gross per 24 hour  Intake    837 ml  Output      0 ml  Net    837 ml   Exam: General: No acute respiratory distress Lungs: Clear to auscultation bilaterally without wheezes or crackles Cardiovascular: Regular rate and rhythm without murmur gallop or rub normal S1 and S2 Abdomen: Nontender, nondistended, soft, bowel sounds positive, no rebound, no ascites, no appreciable mass Extremities: No significant cyanosis, clubbing, or edema L LE -  S/p R AKA  Data  Reviewed: Basic Metabolic Panel:  Recent Labs Lab 02/27/14 2022 02/28/14 0541 02/28/14 0650 03/01/14 0220 03/02/14 0320  NA 140 137 138 135* 135*  K 5.0 5.0 4.9 5.4* 4.3  CL 102 100 100 97 97  CO2 24 24 26 23 24   GLUCOSE 153* 75 73 130* 89  BUN 23 24* 23 30* 15  CREATININE 7.21* 7.56* 7.64* 8.93* 6.14*  CALCIUM 8.5 8.1* 8.2* 7.8* 7.8*  MG  --   --   --   --  1.8   Liver Function Tests:  Recent Labs Lab 02/27/14 2022  AST 19  ALT 10  ALKPHOS 92  BILITOT 1.2  PROT 7.0  ALBUMIN 2.1*    Recent Labs Lab 02/27/14 2022  LIPASE 62*   CBC:  Recent Labs Lab 02/27/14 2022  02/28/14 0541 02/28/14 1710 03/01/14 0220 03/01/14 1255 03/02/14 0320  WBC 12.4*  < > 6.9 7.3 6.0 5.8 5.7  NEUTROABS 9.6*  --   --   --   --   --   --   HGB 8.5*  < > 6.5* 8.2* 7.5* 8.9* 8.4*  HCT 26.7*  < > 20.1* 25.6* 23.4* 27.1* 26.1*  MCV 94.7  < > 94.8 91.4 93.2 89.4 90.9  PLT 420*  < > 264 308 262 260 264  < > = values in this interval not displayed.  Cardiac Enzymes:  Recent Labs Lab 02/28/14 0542 02/28/14 1710 02/28/14 2117  TROPONINI <0.30 <0.30 <0.30   CBG:  Recent Labs Lab 03/01/14 0950 03/01/14 1711 03/01/14 2251 03/02/14 0903 03/02/14 1135  GLUCAP 98 93 144* 119* 155*    Recent Results (from the past 240 hour(s))  CLOSTRIDIUM DIFFICILE BY PCR     Status: Abnormal   Collection Time    02/27/14  9:22 PM      Result Value Ref Range Status   C difficile by pcr POSITIVE (*) NEGATIVE Final   Comment: CRITICAL RESULT CALLED TO, READ BACK BY AND VERIFIED WITH:     B.NORMAN AT 2247 ON 02/27/14 BY S.VANHOORNE  MRSA PCR SCREENING     Status: None   Collection Time    02/28/14 12:34 AM      Result Value Ref Range Status   MRSA by PCR NEGATIVE  NEGATIVE Final   Comment:            The GeneXpert MRSA Assay (FDA     approved for NASAL specimens     only), is one component of a     comprehensive MRSA colonization     surveillance program. It is not     intended to  diagnose MRSA     infection nor to guide or     monitor treatment for     MRSA infections.    Studies:  Recent x-ray studies have been reviewed in detail by the Attending Physician  Scheduled Meds:  Scheduled Meds: . allopurinol  100 mg Oral Daily  . amiodarone  100 mg Oral Daily  .  atorvastatin  20 mg Oral QPM  . cinacalcet  30 mg Oral Q breakfast  . feeding supplement (RESOURCE BREEZE)  1 Container Oral TID BM  . folic acid  1 mg Oral Daily  . insulin aspart  0-15 Units Subcutaneous TID WC  . insulin aspart  0-5 Units Subcutaneous QHS  . metoprolol tartrate  12.5 mg Oral BID  . metroNIDAZOLE  500 mg Oral 3 times per day  . midodrine  5 mg Oral TID AC  . pantoprazole (PROTONIX) IV  40 mg Intravenous Q12H  . sevelamer carbonate  800 mg Oral TID WC  . traZODone  50 mg Oral QHS   Time spent on care of this patient: 35 mins  Cherene Altes, MD [facility] For Consults/Admissions - Flow Manager - [phone] Office  [phone] Pager [phone]  On-Call/Text Page:      [email or web address]      password [facility]  03/02/2014, 1:46 PM   LOS: 3 days

## 2014-03-02 NOTE — Progress Notes (Signed)
Patient ID: WARNIE BELAIR, male   DOB: May 09, 1951, 63 y.o.   MRN: [id number]  [facility] Progress Note   Assessment/ Plan:   1. Painless hematochezia -- currently slowing down and essentially resolved per patient, expectant management per gastroenterology for now 2 Anemia of ABL+CKD -- restarted ESA over the weekend , hemoglobin stable 8.4 3 ESRD on HD: Plan for next hemodialysis tomorrow, early as possible in case he should be discharged  4 Afib on amio, BB, coumadin on hold  5 ^INR- better after FFP  6 Chronic hypotension on midodrine  7 DM on insulin  8 HPTH cont meds (vit D, binder, sensipar)  9 Hx CABG / bioprosthetic MVR '08  0 CM EF 40%  10 Recent SBO and Cdif in June 2015   Subjective:   Reports to be feeling better, states that "hardly any blood in the stool".    Objective:   BP 109/66  Pulse 82  Temp(Src) 98.5 F (36.9 C) (Oral)  Resp 23  Ht 6\' 4"  (1.93 m)  Wt 88.9 kg (195 lb 15.8 oz)  BMI 23.87 kg/m2  SpO2 98%  Physical Exam: Gen: Comfortably resting on the side of his bed, talking on cell phone CVS: Pulse regular rate and rhythm, S1 and S2 with ESM Resp: Clear to auscultation bilaterally-no rales/rhonchi Abd: Soft, flat, nontender and bowel sounds are normal Ext: Left lower extremity no edema, right status post AKA  Labs: BMET  Recent Labs Lab 02/27/14 2022 02/28/14 0541 02/28/14 0650 03/01/14 0220 03/02/14 0320  NA 140 137 138 135* 135*  K 5.0 5.0 4.9 5.4* 4.3  CL 102 100 100 97 97  CO2 24 24 26 23 24   GLUCOSE 153* 75 73 130* 89  BUN 23 24* 23 30* 15  CREATININE 7.21* 7.56* 7.64* 8.93* 6.14*  CALCIUM 8.5 8.1* 8.2* 7.8* 7.8*   CBC  Recent Labs Lab 02/27/14 2022  02/28/14 1710 03/01/14 0220 03/01/14 1255 03/02/14 0320  WBC 12.4*  < > 7.3 6.0 5.8 5.7  NEUTROABS 9.6*  --   --   --   --   --   HGB 8.5*  < > 8.2* 7.5* 8.9* 8.4*  HCT 26.7*  < > 25.6* 23.4* 27.1* 26.1*  MCV 94.7  < > 91.4 93.2 89.4 90.9  PLT 420*  < > 308 262  260 264  < > = values in this interval not displayed.   Medications:    . allopurinol  100 mg Oral Daily  . amiodarone  100 mg Oral Daily  . atorvastatin  20 mg Oral QPM  . cinacalcet  30 mg Oral Q breakfast  . feeding supplement (RESOURCE BREEZE)  1 Container Oral TID BM  . folic acid  1 mg Oral Daily  . insulin aspart  0-15 Units Subcutaneous TID WC  . insulin aspart  0-5 Units Subcutaneous QHS  . metoprolol tartrate  12.5 mg Oral BID  . metroNIDAZOLE  500 mg Oral 3 times per day  . midodrine  5 mg Oral TID AC  . pantoprazole (PROTONIX) IV  40 mg Intravenous Q12H  . sevelamer carbonate  800 mg Oral TID WC  . traZODone  50 mg Oral QHS   Elmarie Shiley, MD 03/02/2014, 8:28 AM

## 2014-03-02 NOTE — Care Management Note (Signed)
    Page 1 of 1   03/02/2014     1:43:23 PM CARE MANAGEMENT NOTE 03/02/2014  Patient:  [name] [name], [name] [name]   Account Number:  [id number]  Date Initiated:  03/02/2014  Documentation initiated by:  Natalie Leclaire  Subjective/Objective Assessment:   dx GI Bleed; lives with spouse    PCP  [employer]     Action/Plan:   Anticipated DC Date:     Anticipated DC Plan:           Choice offered to / List presented to:             Status of service:   Medicare Important Message given?  YES (If response is "NO", the following Medicare IM given date fields will be blank) Date Medicare IM given:  03/02/2014 Medicare IM given by:  Talyssa Gibas Date Additional Medicare IM given:   Additional Medicare IM given by:    Discharge Disposition:    Per UR Regulation:  Reviewed for med. necessity/level of care/duration of stay  If discussed at [place] of Stay Meetings, dates discussed:    Comments:

## 2014-03-03 DIAGNOSIS — K56609 Unspecified intestinal obstruction, unspecified as to partial versus complete obstruction: Secondary | ICD-10-CM

## 2014-03-03 DIAGNOSIS — K6389 Other specified diseases of intestine: Secondary | ICD-10-CM

## 2014-03-03 LAB — PROTIME-INR
INR: 1.9 — AB (ref 0.00–1.49)
PROTHROMBIN TIME: 21.8 s — AB (ref 11.6–15.2)

## 2014-03-03 LAB — CBC
HCT: 25.3 % — ABNORMAL LOW (ref 39.0–52.0)
HEMOGLOBIN: 8.1 g/dL — AB (ref 13.0–17.0)
MCH: 29.1 pg (ref 26.0–34.0)
MCHC: 32 g/dL (ref 30.0–36.0)
MCV: 91 fL (ref 78.0–100.0)
PLATELETS: 258 10*3/uL (ref 150–400)
RBC: 2.78 MIL/uL — AB (ref 4.22–5.81)
RDW: 16 % — ABNORMAL HIGH (ref 11.5–15.5)
WBC: 5.6 10*3/uL (ref 4.0–10.5)

## 2014-03-03 LAB — RENAL FUNCTION PANEL
ALBUMIN: 2 g/dL — AB (ref 3.5–5.2)
Anion gap: 17 — ABNORMAL HIGH (ref 5–15)
BUN: 19 mg/dL (ref 6–23)
CALCIUM: 7.8 mg/dL — AB (ref 8.4–10.5)
CHLORIDE: 96 meq/L (ref 96–112)
CO2: 23 meq/L (ref 19–32)
Creatinine, Ser: 7.96 mg/dL — ABNORMAL HIGH (ref 0.50–1.35)
GFR calc Af Amer: 7 mL/min — ABNORMAL LOW (ref >90)
GFR, EST NON AFRICAN AMERICAN: 6 mL/min — AB (ref >90)
Glucose, Bld: 108 mg/dL — ABNORMAL HIGH (ref 70–99)
Phosphorus: 6.1 mg/dL — ABNORMAL HIGH (ref 2.3–4.6)
Potassium: 4.7 mEq/L (ref 3.7–5.3)
Sodium: 136 mEq/L — ABNORMAL LOW (ref 137–147)

## 2014-03-03 LAB — GLUCOSE, CAPILLARY: Glucose-Capillary: 84 mg/dL (ref 70–99)

## 2014-03-03 MED ORDER — NEPRO/CARBSTEADY PO LIQD: 237.0000 mL | ORAL | Status: DC | PRN

## 2014-03-03 MED ORDER — SODIUM CHLORIDE 0.9 % IV SOLN: 100.0000 mL | INTRAVENOUS | Status: DC | PRN

## 2014-03-03 MED ORDER — ALTEPLASE 2 MG IJ SOLR
2.0000 mg | Freq: Once | INTRAMUSCULAR | Status: DC | PRN
Start: 2014-03-03 — End: 2014-03-03
  Filled 2014-03-03: qty 2

## 2014-03-03 MED ORDER — HEPARIN SODIUM (PORCINE) 1000 UNIT/ML DIALYSIS: 1000.0000 [IU] | INTRAMUSCULAR | Status: DC | PRN

## 2014-03-03 MED ORDER — METOPROLOL TARTRATE 25 MG PO TABS: 12.5000 mg | ORAL_TABLET | Freq: Two times a day (BID) | ORAL | Status: DC

## 2014-03-03 MED ORDER — PENTAFLUOROPROP-TETRAFLUOROETH EX AERO: 1.0000 "application " | INHALATION_SPRAY | CUTANEOUS | Status: DC | PRN

## 2014-03-03 MED ORDER — METRONIDAZOLE 500 MG PO TABS: 500.0000 mg | ORAL_TABLET | Freq: Three times a day (TID) | ORAL | Status: DC

## 2014-03-03 MED ORDER — LIDOCAINE-PRILOCAINE 2.5-2.5 % EX CREA
1.0000 | TOPICAL_CREAM | CUTANEOUS | Status: DC | PRN
Start: 2014-03-03 — End: 2014-03-03

## 2014-03-03 MED ORDER — LIDOCAINE HCL (PF) 1 % IJ SOLN: 5.0000 mL | INTRAMUSCULAR | Status: DC | PRN

## 2014-03-03 NOTE — Progress Notes (Signed)
Patient ID: IBROHIM SIMMERS, male   DOB: 1950-09-13, 63 y.o.   MRN: [id number]  [facility] Progress Note   Assessment/ Plan:   1. Painless hematochezia -- resolved and suspected to be of diverticular source. Anticipated DC later today 2 Anemia of ABL+CKD -- restarted ESA over the weekend , hemoglobin stable 8.1  3 ESRD on HD: HD today with possible DC thereafter- no heparin due to GIB 4 Afib on amio, BB, coumadin on hold  5 ^INR- better after FFP  6 Chronic hypotension on midodrine  7 DM on insulin  8 HPTH cont meds (vit D, binder, sensipar)  9 Hx CABG / bioprosthetic MVR '08  0 CM EF 40%  10 Recent SBO and Cdif in June 2015  Subjective:   Reports to be feeling well- no further hematochezia   Objective:   BP 130/53  Pulse 87  Temp(Src) 98.5 F (36.9 C) (Oral)  Resp 24  Ht 6\' 4"  (1.93 m)  Wt 89.4 kg (197 lb 1.5 oz)  BMI 24.00 kg/m2  SpO2 97%  Physical Exam: [id number] on HD NIO:EVOJJ RRR, normal s1 and s2 with ESM Resp:CTA bilaterally, no rales/rhonchi KKX:FGHW, flat, NT, BS normal Ext:No Left leg edema. Rt s/p AKA  Labs: BMET  Recent Labs Lab 02/27/14 2022 02/28/14 0541 02/28/14 0650 03/01/14 0220 03/02/14 0320 03/03/14 0234  NA 140 137 138 135* 135* 136*  K 5.0 5.0 4.9 5.4* 4.3 4.7  CL 102 100 100 97 97 96  CO2 24 24 26 23 24 23   GLUCOSE 153* 75 73 130* 89 108*  BUN 23 24* 23 30* 15 19  CREATININE 7.21* 7.56* 7.64* 8.93* 6.14* 7.96*  CALCIUM 8.5 8.1* 8.2* 7.8* 7.8* 7.8*  PHOS  --   --   --   --   --  6.1*   CBC  Recent Labs Lab 02/27/14 2022  03/01/14 0220 03/01/14 1255 03/02/14 0320 03/03/14 0234  WBC 12.4*  < > 6.0 5.8 5.7 5.6  NEUTROABS 9.6*  --   --   --   --   --   HGB 8.5*  < > 7.5* 8.9* 8.4* 8.1*  HCT 26.7*  < > 23.4* 27.1* 26.1* 25.3*  MCV 94.7  < > 93.2 89.4 90.9 91.0  PLT 420*  < > 262 260 264 258  < > = values in this interval not displayed.  Medications:    . allopurinol  100 mg Oral Daily  . amiodarone  100  mg Oral Daily  . atorvastatin  20 mg Oral QPM  . cinacalcet  30 mg Oral Q breakfast  . famotidine  20 mg Oral QHS  . feeding supplement (RESOURCE BREEZE)  1 Container Oral TID BM  . folic acid  1 mg Oral Daily  . insulin aspart  0-15 Units Subcutaneous TID WC  . insulin aspart  0-5 Units Subcutaneous QHS  . metoprolol tartrate  12.5 mg Oral BID  . metroNIDAZOLE  500 mg Oral 3 times per day  . midodrine  5 mg Oral TID AC  . sevelamer carbonate  800 mg Oral TID WC  . traZODone  50 mg Oral QHS   Elmarie Shiley, MD 03/03/2014, 9:24 AM

## 2014-03-03 NOTE — Procedures (Signed)
Patient seen on Hemodialysis. QB 400, UF goal 2.5L Treatment adjusted as needed.  Elmarie Shiley MD [facility]. Office # [phone] Pager # [phone] 9:23 AM

## 2014-03-03 NOTE — Discharge Summary (Signed)
Physician Discharge Summary  [name] [name] KWI:[id number] DOB: 01/18/1951 DOA: 02/27/2014  PCP: [name], MD  Admit date: 02/27/2014 Discharge date: 03/03/2014  Time spent: 40 minutes  Recommendations for Outpatient Follow-up:   GI bleed / Painless hematochezia / known diverticulosis w/ pneumotosis  -most c/w diverticular bleeding in presence of elevated INR  -Stable hemoglobin  -7/4 2 units FFP  -7/4 1 unit PRBC  -7/5 1 unit PRBC  -Negative N./V. postprandial    Acute blood loss anemia + chronic anemia related to chronic kidney disease  S/p 2U PRBC total. Hemoglobin stable for discharge   C diff 02/17/2014  To complete original prescribed 14 day course of flagyl (will need one more day of antibiotics) -Followup with PCP  SBO June 2015  No clinical sx to suggest recurrence   ESRD on HD T/TH/Sat  -HD per Nephrology   Chronic A. fib (on Coumadin)  -Coumadin on hold secondary to GI bleed - cleared per GI to resume in one week (03/08/14) - rate currently well controlled  -Followup with Dr. 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 Rozann Lesches cardiologist  CAD s/p CABG MVR (biprosthetic)  Asymptomatic   Chronic R pleural effusion   HTN  -well controlled/bordering on hypotension - follow   HLD  -cont medical tx   Diabetes type 2  -Continue moderate SSI  -Within ADA guidelines; hemoglobin A1c = 5.9  -Discharge on home regimen -Followup with PCP        Discharge Diagnoses:  Principal Problem:   Lower GI bleed Active Problems:   GI bleeding   Supratherapeutic INR   Acute blood loss anemia   Discharge Condition: Stable  Diet recommendation: American diabetic  Association/heart healthy  Filed Weights   03/03/14 0335 03/03/14 0703 03/03/14 1053  Weight: 89.5 kg (197 lb 5 oz) 89.4 kg (197 lb 1.5 oz) 87.3 kg (192 lb 7.4 oz)    History of present illness:  63 y/o male with PMH including ESRD on HD T/Th/Sat, A. Fib on coumadin, HTN, CHF, cardiomyopathy LVEF of 15 to 20%, DM Type 2, h/o UGI bleed recently discharged on 02/21/14 after treatment for SBO and small area of cecal pneumatosis. Transferred from [facility] to [facility] for LGIB in setting of INR = 7.  Recently admitted with c.diff, partial SBO and cecal pneumatosis. Resolved with medical therapy. Discharged home on flagyl. Was doing well until this evening when he developed several episodes of BRBPR. Said he had formed stool but toilet was filled up with BRB. Denies ab pain, diarrhea, N/V, fevers, chills. Has not been taking any NSAIDs  Went to [facility] ER. INR found to have INR 7.29. Hgb 8.5 (was 9.1 on 6/27). BP 87/49. Given 500cc NS and 1 u FFP and 1mg  vit k. On arrival had another episode of BRBPR.  According to records had colonoscopy 10/12: multiple sessile and pedunculated polyps, moderate diverticulosis, internal hemorrhoids, +ADENOMATOUS POLYPS. Was due for f/u this year.  EGD 6/12: On anticoagulation; EGD w/snare polypectomy-multiple polypoid lesions antrum(benign), Chronic active gastritis (NEGATIVE H pylori)  During patient's hospitalization he received a total of 2 units PRBC, and 2 units FFP. Coumadin was held and patient's hemoglobin has  stabilized.    Consultants:  Dr. Roney Jaffe (nephrology)  Dr. Scarlette Shorts (GI)    Procedure/Significant Events:  4/29 echocardiogram  - Left ventricle: mildly dilated. Mild LVH. LVEF= EF 15-20%. Severe global hypokinesis  -Doppler parameters consistentwith a reversible restrictive pattern  -(grade 3 diastolic dysfunction). - Left atrium: severely dilated. - Right ventricle: mildly dilated. - Right atrium: The atrium was mildly dilated.  01/2011 - EGD On  anticoagulation; EGD w/snare polypectomy-multiple polypoid lesions antrum (benign), Chronic active gastritis (NEGATIVE H pylori)  ..............................................................  7/4 admitted to ICU from [facility] for LGIB. 3 units FFP on 7/4, 1 unit PRBC 7/4, 1 unit PRBC 7/5, + Vit K    Culture  7/3 C-Diff >> POSITIVE    Antibiotics:  Flagyl (C.diff ) 6/23 >> stopped date 7/8 per GI will be a complete 14 day course     Discharge Exam: Filed Vitals:   03/03/14 1000 03/03/14 1030 03/03/14 1053 03/03/14 1202  BP: 133/59 121/64 131/78 106/69  Pulse: 95 95 88 97  Temp:   96.9 F (36.1 C)   TempSrc:   Oral   Resp: 22 20 14    Height:      Weight:   87.3 kg (192 lb 7.4 oz)   SpO2:   99%    General: A./O. x4, NAD No acute respiratory distress  Lungs: Clear to auscultation bilaterally without wheezes or crackles  Cardiovascular: Regular rate and rhythm without murmur gallop or rub normal S1 and S2  Abdomen: Nontender, nondistended, soft, bowel sounds positive, no rebound, no ascites, no appreciable mass  Extremities: No significant cyanosis, clubbing, or edema bilateral lower extremities. Right arm AV fistula covered and clean  Discharge Instructions     Medication List    ASK your doctor about these medications       albuterol (2.5 MG/3ML) 0.083% nebulizer solution  Commonly known as:  PROVENTIL  Take 3 mLs (2.5 mg total) by nebulization every 2 (two) hours as needed for wheezing.     allopurinol 100 MG tablet  Commonly known as:  ZYLOPRIM  Take 100 mg by mouth daily.     amiodarone 100 MG tablet  Commonly known as:  PACERONE  Take 100 mg by mouth daily.     aspirin EC 81 MG tablet  Take 81 mg by mouth every evening.     atorvastatin 20 MG tablet  Commonly known as:  LIPITOR  Take 20 mg by mouth every evening.     DIALYVITE/ZINC PO  Take by mouth 3 (three) times a week. Patient has dialysis on Tuesdays, Thursdays, and Saturdays     diltiazem 180 MG 24  hr capsule  Commonly known as:  CARDIZEM CD  Take 180 mg by mouth daily.     folic acid 1 MG tablet  Commonly known as:  FOLVITE  Take 1 mg by mouth daily.     insulin glargine 100 UNIT/ML injection  Commonly known as:  LANTUS  Inject 10 Units into the skin at bedtime.     metoprolol tartrate 25 MG tablet  Commonly known as:  LOPRESSOR  Take 1 tablet (25 mg total) by mouth 2 (two) times daily.     metroNIDAZOLE 500 MG tablet  Commonly known as:  FLAGYL  Take 1 tablet (500 mg total) by mouth 3 (three) times daily.     midodrine 5 MG tablet  Commonly known as:  PROAMATINE  Take 1 tablet (5 mg total) by mouth 3 (three) times daily before meals.  pantoprazole 40 MG tablet  Commonly known as:  PROTONIX  Take 40 mg by mouth 2 (two) times daily.     RENVELA 800 MG tablet  Generic drug:  sevelamer carbonate  Take 800 mg by mouth 3 (three) times daily with meals.     SENSIPAR 30 MG tablet  Generic drug:  cinacalcet  Take 30 mg by mouth daily.     traMADol 50 MG tablet  Commonly known as:  ULTRAM  Take by mouth every 8 (eight) hours as needed for moderate pain.     traZODone 50 MG tablet  Commonly known as:  DESYREL  Take 1 tablet (50 mg total) by mouth at bedtime.     warfarin 2.5 MG tablet  Commonly known as:  COUMADIN  Take 0.5-1 tablets (1.25-2.5 mg total) by mouth See admin instructions. Take 1.25 mg (1/2 tab) daily EXCEPT for 2.5 mg (1 tablet) on Wednesdays only       Allergies  Allergen Reactions  . Bacitracin Other (See Comments)    unknown  . Norvasc [Amlodipine Besylate] Other (See Comments)    unknown      The results of significant diagnostics from this hospitalization (including imaging, microbiology, ancillary and laboratory) are listed below for reference.    Significant Diagnostic Studies: Dg Chest 2 View  02/13/2014   CLINICAL DATA:  Shortness of breath and weakness.  EXAM: CHEST  2 VIEW  COMPARISON:  CT of the abdomen and pelvis performed  earlier today at 9:41 p.m., and chest radiograph from 09/17/2013  FINDINGS: Trace right-sided pneumothorax is seen. A small to moderate right-sided pleural effusion is noted, with associated airspace opacification. This may reflect atelectasis or pneumonia. Mild left basilar opacity may also reflect pneumonia, given the appearance on CT.  The heart is borderline enlarged. The patient is status post median sternotomy, with evidence of prior CABG. No acute osseous abnormalities are seen.  IMPRESSION: 1. Trace right-sided pneumothorax seen, as noted on CT. 2. Small-to-moderate right-sided pleural effusion, with associated airspace opacification; this may reflect atelectasis or pneumonia. Mild left basilar pneumonia seen. 3. Borderline cardiomegaly.   Electronically Signed   By: Garald Balding M.D.   On: 02/13/2014 23:22   Ct Abdomen Pelvis W Contrast  02/27/2014   CLINICAL DATA:  Rectal bleeding.  EXAM: CT ABDOMEN AND PELVIS WITH CONTRAST  TECHNIQUE: Multidetector CT imaging of the abdomen and pelvis was performed using the standard protocol following bolus administration of intravenous contrast.  CONTRAST:  57mL OMNIPAQUE IOHEXOL 300 MG/ML SOLN, 151mL OMNIPAQUE IOHEXOL 300 MG/ML SOLN  COMPARISON:  CT 02/13/2014.  Plain films 02/19/2014.  FINDINGS: Moderate to large right pleural effusion, increased in size since prior study. There is also areas of increased density now within the right pleural space. If compressive atelectasis in the right lower lobe.  Small nodule in the left lower lobe posteriorly measures 5 mm. This is stable dating back to prior chest CT from 2013 hands compatible with benign nodule.  Liver, spleen, pancreas, adrenals are unremarkable. Kidneys are mildly atrophic with scattered small low-density areas, likely cysts. Bilateral perinephric stranding. No hydronephrosis.  Small gallstones layering within the gallbladder.  Stomach and small bowel are unremarkable. No evidence of small bowel  obstruction currently. Descending colonic diverticulosis. No active diverticulitis. Again noted is mottled gas in the region of the cecal tip as seen on prior study, not significantly changed. This presumably represents pneumatosis within the cecal wall. This could be benign pneumatosis or related to ischemia. Appendix is  visualized and is borderline dilated at 9 mm. No definite surrounding inflammatory process.  No free fluid, free air or adenopathy. Prostate is mildly enlarged. Seminal vesicles are calcified. Urinary bladder is decompressed.  Aorta and iliac vessels are heavily calcified as are the mesenteric vessels and renal arteries.  Right inguinal hernia containing fat, unchanged.  IMPRESSION: Continued mottled gas in the region of the cecal tip, presumably pneumatosis. This could be benign or related to ischemia.  Borderline enlarged appendix at 9 mm without surrounding inflammatory change.  Cholelithiasis.  Complex right pleural effusion, now with areas of increased soft tissue density throughout the right pleural space. This may be related to blood/hemothorax.  These results were called by telephone at the time of interpretation on 02/27/2014 at 10:05 PM to Dr. Francine Graven , who verbally acknowledged these results.   Electronically Signed   By: Rolm Baptise M.D.   On: 02/27/2014 22:05   Ct Abdomen Pelvis W Contrast  02/13/2014   CLINICAL DATA:  Abdominal pain, end-stage renal disease  EXAM: CT ABDOMEN AND PELVIS WITH CONTRAST  TECHNIQUE: Multidetector CT imaging of the abdomen and pelvis was performed using the standard protocol following bolus administration of intravenous contrast.  CONTRAST:  100 mL Isovue  COMPARISON:  CT chest 12/06/2011  FINDINGS: There is a chronic right pleural effusion with associated dense right basilar atelectasis within air bronchograms. Cannot exclude right lower lobe pneumonia. There is a small pneumothorax anterior to the right middle lobe. No pericardial fluid. Mild  airspace disease in the lower left lower lobe could represent pneumonia or aspiration pneumonitis.  No focal hepatic lesion. The gallbladder, pancreas, spleen, adrenal glands, are normal. The kidneys show minimal enhancement. Cyst of the left kidney.  The stomach contains a moderate amount of oral contrast. The duodenum and jejunum are dilated with poor progression of the oral contrast. Proximal small bowel loops measure 4 cm. The most distal small bowel is near completely collapsed in the pelvis (images 78, series 2). At discrete transition point is not identified. There is gas within the wall of the cecum tip as seen on coronal image 51, series 4. The appendix appears normal. The ascending colon is collapsed. The transverse and descending colon are collapsed.  Abdominal aorta is heavily calcified. No retroperitoneal periportal lymphadenopathy.  No free fluid the pelvis. The prostate gland and bladder normal. No pelvic lymphadenopathy. There is bilateral inguinal hernias. No aggressive osseous lesion. There is chronic appearing compression fracture of the L1 vertebral body.  IMPRESSION: 1. Chronic right pleural effusion with associated right lower lobe atelectasis. Cannot exclude right lower lobe pneumonia. 2. Small pneumothorax is noted anterior to the right middle lobe. Recommend chest radiograph to exclude a larger pneumothorax. 3. Pneumonitis versus pneumonia at the left lung base. Consider aspiration. 4. Small bowel obstruction pattern with change in caliber from the proximal small bowel to the distal small bowel with a poor progression oral contrast. No transition point identified. 5. Pneumatosis of the tip of the cecum. This could be a benign pneumatosis but cannot exclude ischemia. 6. Appendix is normal. Findings conveyed toCHRISTOPHER POLLINA on 02/13/2014  at22:02.   Electronically Signed   By: Suzy Bouchard M.D.   On: 02/13/2014 22:02   Dg Chest Port 1 View  03/01/2014   CLINICAL DATA:  Airspace  disease.  EXAM: PORTABLE CHEST - 1 VIEW  COMPARISON:  One-view chest 02/15/2014.  FINDINGS: The NG tube and left IJ line have been removed. The heart is enlarged. A a  large right pleural effusion is noted. Associated airspace disease with air bronchograms is noted. Diffuse interstitial edema has increased. Mild left basilar airspace disease likely reflects atelectasis.  IMPRESSION: 1. Large right pleural effusion and associated airspace disease. While this may represent atelectasis, infection is not excluded. 2. Cardiomegaly and mild increased edema, concerning for congestive heart failure. 3. Minimal left basilar airspace disease likely reflects atelectasis.   Electronically Signed   By: Lawrence Santiago M.D.   On: 03/01/2014 07:58   Dg Chest Port 1 View  02/15/2014   CLINICAL DATA:  Rule out pneumothorax  EXAM: PORTABLE CHEST - 1 VIEW  COMPARISON:  02/14/2014  FINDINGS: Left jugular central venous catheter tip in the SVC is unchanged. NG tube in the stomach.  Bibasilar airspace disease right greater than left with right pleural effusion is unchanged. No pneumothorax.  IMPRESSION: Bibasilar atelectasis/ infiltrate and right pleural effusion unchanged. No pneumothorax.   Electronically Signed   By: Franchot Gallo M.D.   On: 02/15/2014 10:31   Dg Chest Port 1 View  02/14/2014   CLINICAL DATA:  Central line and nasogastric tube placement.  EXAM: PORTABLE CHEST - 1 VIEW  COMPARISON:  Chest radiograph from 02/13/2014  FINDINGS: The patient's left IJ line is seen ending about the mid to distal SVC. An enteric tube is noted extending below the diaphragm.  There is a mildly worsened small to moderate right-sided pleural effusion. The previously noted trace right pneumothorax is suggested but not well seen. Worsening bibasilar airspace opacification may reflect worsening pneumonia. Asymmetric interstitial edema might have a similar appearance. Underlying vascular congestion is seen.  The cardiomediastinal silhouette is  enlarged. The patient status post median sternotomy, with evidence of prior CABG. No acute osseous abnormalities are seen.  IMPRESSION: 1. Left IJ line seen ending about the mid to distal SVC. 2. Enteric tube noted extending below the diaphragm. 3. Mildly worsened small to moderate right-sided pleural effusion. Previously noted trace right pneumothorax is suggested but not well seen. 4. Worsening bibasilar airspace opacification may reflect worsening pneumonia. Asymmetric interstitial edema might have a similar appearance. 5. Underlying vascular congestion and cardiomegaly noted.   Electronically Signed   By: Garald Balding M.D.   On: 02/14/2014 03:04   Dg Abd 2 Views  02/16/2014   CLINICAL DATA:  Abdominal pain.  EXAM: ABDOMEN - 2 VIEW  COMPARISON:  02/15/2014.  FINDINGS: There is a nasogastric tube coiled in the stomach. There are multiple dilated loops of small bowel which measure up to 4.2 cm. No free intraperitoneal air identified.  IMPRESSION: 1. No change in high-grade small bowel obstruction pattern   Electronically Signed   By: Kerby Moors M.D.   On: 02/16/2014 09:25   Dg Abd Portable 1v  02/19/2014   CLINICAL DATA:  63 year old male with pain and small bowel obstruction. Initial encounter. End-stage renal disease.  EXAM: PORTABLE ABDOMEN - 1 VIEW  COMPARISON:  02/17/2014 and earlier.  FINDINGS: Portable AP view at 0627 hrs. Dilated gas-filled small bowel loops throughout much of the abdomen are mildly decreased in caliber (33 mm now versus 43 mm 2 days ago). Increased gas in decompressed left colon.  Persistent opacification at the right lung base compatible with a combination of a fusion and airspace opacity.  IMPRESSION: 1. Mildly improved bowel gas pattern, now compatible with partial small bowel obstruction (previously high-grade). 2. Right pleural effusion.   Electronically Signed   By: Lars Pinks M.D.   On: 02/19/2014 08:13   Dg  Abd Portable 1v  02/17/2014   CLINICAL DATA:  Small-bowel  obstruction.  EXAM: PORTABLE ABDOMEN - 1 VIEW  COMPARISON:  02/16/2014.  FINDINGS: Persistent distention of small bowel loops noted. Bowel dilatation up to 4.2 cm again noted. No significant interval improvement. NG tube noted coiled in stomach. The stomach is nondistended. Paucity of colonic gas present. Aortoiliac atherosclerotic vascular disease. Degenerative changes lumbar spine and both hips.  IMPRESSION: Persistent unchanged small-bowel obstruction. NG tube noted coiled in stomach. The stomach is nondistended .   Electronically Signed   By: Marcello Moores  Register   On: 02/17/2014 08:10   Dg Abd Portable 1v  02/15/2014   CLINICAL DATA:  Small-bowel obstruction  EXAM: PORTABLE ABDOMEN - 1 VIEW  COMPARISON:  CT 02/13/2014  FINDINGS: Multiple dilated small bowel loops consistent with small bowel obstruction as noted on the CT. Upright view was not obtained to evaluate for air-fluid level or free air. No colonic gas is present.  IMPRESSION: High-grade small-bowel obstruction is unchanged.   Electronically Signed   By: Franchot Gallo M.D.   On: 02/15/2014 10:59    Microbiology: Recent Results (from the past 240 hour(s))  CLOSTRIDIUM DIFFICILE BY PCR     Status: Abnormal   Collection Time    02/27/14  9:22 PM      Result Value Ref Range Status   C difficile by pcr POSITIVE (*) NEGATIVE Final   Comment: CRITICAL RESULT CALLED TO, READ BACK BY AND VERIFIED WITH:     B.NORMAN AT 2247 ON 02/27/14 BY S.VANHOORNE  MRSA PCR SCREENING     Status: None   Collection Time    02/28/14 12:34 AM      Result Value Ref Range Status   MRSA by PCR NEGATIVE  NEGATIVE Final   Comment:            The GeneXpert MRSA Assay (FDA     approved for NASAL specimens     only), is one component of a     comprehensive MRSA colonization     surveillance program. It is not     intended to diagnose MRSA     infection nor to guide or     monitor treatment for     MRSA infections.     Labs: Basic Metabolic Panel:  Recent  Labs Lab 02/28/14 0541 02/28/14 0650 03/01/14 0220 03/02/14 0320 03/03/14 0234  NA 137 138 135* 135* 136*  K 5.0 4.9 5.4* 4.3 4.7  CL 100 100 97 97 96  CO2 24 26 23 24 23   GLUCOSE 75 73 130* 89 108*  BUN 24* 23 30* 15 19  CREATININE 7.56* 7.64* 8.93* 6.14* 7.96*  CALCIUM 8.1* 8.2* 7.8* 7.8* 7.8*  MG  --   --   --  1.8  --   PHOS  --   --   --   --  6.1*   Liver Function Tests:  Recent Labs Lab 02/27/14 2022 03/03/14 0234  AST 19  --   ALT 10  --   ALKPHOS 92  --   BILITOT 1.2  --   PROT 7.0  --   ALBUMIN 2.1* 2.0*    Recent Labs Lab 02/27/14 2022  LIPASE 62*   No results found for this basename: AMMONIA,  in the last 168 hours CBC:  Recent Labs Lab 02/27/14 2022  02/28/14 1710 03/01/14 0220 03/01/14 1255 03/02/14 0320 03/03/14 0234  WBC 12.4*  < > 7.3 6.0 5.8 5.7 5.6  NEUTROABS  9.6*  --   --   --   --   --   --   HGB 8.5*  < > 8.2* 7.5* 8.9* 8.4* 8.1*  HCT 26.7*  < > 25.6* 23.4* 27.1* 26.1* 25.3*  MCV 94.7  < > 91.4 93.2 89.4 90.9 91.0  PLT 420*  < > 308 262 260 264 258  < > = values in this interval not displayed. Cardiac Enzymes:  Recent Labs Lab 02/28/14 0542 02/28/14 1710 02/28/14 2117  TROPONINI <0.30 <0.30 <0.30   BNP: BNP (last 3 results) No results found for this basename: PROBNP,  in the last 8760 hours CBG:  Recent Labs Lab 03/02/14 0903 03/02/14 1135 03/02/14 1733 03/02/14 2125 03/03/14 1154  GLUCAP 119* 155* 71 104* 84       Signed:  Dia Crawford, MD [facility] [phone] pager

## 2014-03-03 NOTE — Progress Notes (Signed)
Wife came in to pick up patient.  Discharge to home in stable condition.

## 2014-03-03 NOTE — Progress Notes (Signed)
Discharge instructions, follow-up visits emphasized.  Patient verbalized understanding.  IV access discontinued.

## 2014-03-11 ENCOUNTER — Encounter: Payer: Self-pay | Admitting: Gastroenterology

## 2014-03-11 ENCOUNTER — Ambulatory Visit (INDEPENDENT_AMBULATORY_CARE_PROVIDER_SITE_OTHER): Payer: Medicare Other | Admitting: Gastroenterology

## 2014-03-11 VITALS — BP 98/67 | HR 88 | Temp 98.9°F | Resp 20 | Ht 76.0 in | Wt 192.0 lb

## 2014-03-11 DIAGNOSIS — D126 Benign neoplasm of colon, unspecified: Secondary | ICD-10-CM

## 2014-03-11 DIAGNOSIS — K922 Gastrointestinal hemorrhage, unspecified: Secondary | ICD-10-CM

## 2014-03-11 DIAGNOSIS — R634 Abnormal weight loss: Secondary | ICD-10-CM

## 2014-03-11 DIAGNOSIS — K6389 Other specified diseases of intestine: Secondary | ICD-10-CM | POA: Insufficient documentation

## 2014-03-11 DIAGNOSIS — I2510 Atherosclerotic heart disease of native coronary artery without angina pectoris: Secondary | ICD-10-CM

## 2014-03-11 MED ORDER — PEG 3350-KCL-NA BICARB-NACL 420 G PO SOLR: 4000.0000 mL | ORAL | Status: DC

## 2014-03-11 NOTE — Assessment & Plan Note (Signed)
PT CLINICALLY/CT IMPROVED AND COMPLETING FLAGYL SOON.  CONTINUE TO MONITOR SYMPTOMS.

## 2014-03-11 NOTE — Assessment & Plan Note (Signed)
NO OVERT REASON BUT PT CLINICALLY/CRITICALLY ILL IN 2015.  CONTINUE TO MONITOR SYMPTOMS. FOLLOW UP IN 2 MOS.

## 2014-03-11 NOTE — Addendum Note (Signed)
Addended by: Idamae Schuller on: 03/11/2014 04:41 PM   Modules accepted: Orders

## 2014-03-11 NOTE — Assessment & Plan Note (Signed)
Symptoms RESOLVED DUE TO DIVERTICULAR BLEED IN SETTING OF INR 7.9.. I PERSONALLY REVIEWED CT  JUN/JUL 2015 WITH DR. Louis Meckel. PNEUMATOSIS ON CT JUN 2015 RESOLVED BY JUL 2015. BACK ON COUMADIN AND NO RE-CHECK INR.  ARRANGED FOR COUMADIN CLINIC APPT FRI JUL 17 AT 310 PM.

## 2014-03-11 NOTE — Addendum Note (Signed)
Addended by: Idamae Schuller on: 03/11/2014 03:47 PM   Modules accepted: Orders

## 2014-03-11 NOTE — Assessment & Plan Note (Signed)
RECENT RECTAL BLEEDING AND PNEUMATOSIS ON CT.  TCS IN AUG 2015. DISCUSSED ANTI-COAGULATION WITH DR. BRANCH AWAITING CALL FOR RECOMMENDATIONS REGARDING COUMADIN. HOLD COUMADIN 4 DAYS PRIOR TO TCS. MOVIPREP. HALF LANTUS ON NIGHT PRIOR TO TCS. FOLLOW UP IN 2 MOS.

## 2014-03-11 NOTE — Progress Notes (Signed)
Subjective:    Patient ID: [name] [name], male    DOB: 04/10/1951, 63 y.o.   MRN: [id number]  [name], MD  HPI NO BLOOD IN STOOL OR PROBLEMS SWALLOWING. NO ABD PAIN CURRENTLY. BMs: 3-4 day(SOFT). BACK ON COUMADIN 2.5G MG QD EXCEPT WED 5 MG. MISSED APPT ON JUN 26 BECAUSE HE WAS IN THE HOSPITAL.   PT DENIES FEVER, CHILLS, nausea, vomiting, melena, diarrhea, CHEST PAIN, SHORTNESS OF BREATH,  CHANGE IN BOWEL IN HABITS, constipation, abdominal pain, problems swallowing, OR heartburn or indigestion.  Past Medical History  Diagnosis Date  . UGI bleed 02/12/11    On anticoagulation; EGD w/snare polypectomy-multiple polypoid lesions antrum(benign), Chronic active gastritis (NEGATIVE H pylori)  . Anemia, chronic disease   . Sepsis due to enterococcus 02/11/11  . Atrial fibrillation     On coumadin  . Coronary atherosclerosis of native coronary artery     Multivessel status post CABG  . Cardiomyopathy     LVEF 40-45% 01/2011  . Type 2 diabetes mellitus   . Essential hypertension, benign   . Hyperlipemia   . Gout   . S/P patent foramen ovale closure   . ESRD on hemodialysis     Started dialysis sometime around 2008-2009.  Gets HD in [place], [place] with Dr Hinda Lenis on a TTS schedule.  Runs 3.5 hours with dry wt 86.5 kg as of June 2015.  Had a L arm AVF which didn't work and has been using his R forearm AVF for several years.    . History of nuclear stress test 07/04/2010    dipyridamole; mod fixed inferolateral defect; non-diagnostic for ischemia; low risk scan   . Hyperbilirubinemia 08/10/2013  . SBO (small bowel obstruction) 01/2014  . Pneumatosis coli 01/2014    of cecum  . Clostridium difficile colitis 01/2014  . Chronic anticoagulation     coumadin  . Diverticulosis   . Internal hemorrhoids    Past Surgical History  Procedure Laterality Date  . Mitral valve replacement  01/05/2007    Bioprosthetic 48mm Edwards precordial tissue (Dr. Servando Snare)  . Coronary artery bypass graft   01/05/2007    LIMA-LAD, SVG-OM, seq. SVG-PDA, PLA-RCA  . Colonoscopy  06/23/2011    Dr. Oneida Alar: multiple sessile and pedunculated polyps, moderate diverticulosis, internal hemorrhoids, +ADENOMATOUS POLYPS, consider genetic testing, surveillance in October 2015   . Esophagogastroduodenoscopy  02/12/2011    CHRONIC ACTIVE GASTRITIS, NO H. pylori  . Transthoracic echocardiogram  11/2011    EF 25% w/ mod dilatation of LV & mod LVH; LA severely dilated; mod-severe dilation of RV with mod-severe decrease in RV function; mild MR & TR  . Cardioversion  04/19/2007    Dr. Marella Chimes  . Transthoracic echocardiogram  02/14/2011    EF 40-45%, mod conc LVH; ventricular septum with motion showing abnormal function, dyssynergy, paradox; mild AS; mild-mod MR stenosis; LA severely dilated; aptrial septum bowed from L to R with increased LA pressure   . Amputation Right 07/14/2013    Procedure: AMPUTATION DIGIT;  Surgeon: [name], MD;  Location: [facility];  Service: General;  Laterality: Right;  . Amputation Right 08/08/2013    Procedure: AMPUTATION BELOW KNEE;  Surgeon: [name], MD;  Location: [facility];  Service: General;  Laterality: Right;  . Stump revision Right 09/19/2013    Procedure: STUMP REVISION;  Surgeon: Scherry Ran, MD;  Location: [facility];  Service: General;  Laterality: Right;  . Amputation Right 10/17/2013    Procedure: AMPUTATION ABOVE  KNEE;  Surgeon: [name], MD;  Location: [facility];  Service: General;  Laterality: Right;    Allergies  Allergen Reactions  . Bacitracin Other (See Comments)    unknown  . Norvasc [Amlodipine Besylate] Other (See Comments)    unknown    Current Outpatient Prescriptions  Medication Sig Dispense Refill  . albuterol (PROVENTIL) (2.5 MG/3ML) 0.083% nebulizer solution Take 3 mLs (2.5 mg total) by nebulization every 2 (two) hours as needed for wheezing.  75 mL  12  . allopurinol (ZYLOPRIM) 100 MG tablet Take 100 mg by mouth daily.       Marland Kitchen  amiodarone (PACERONE) 100 MG tablet Take 100 mg by mouth daily.      Marland Kitchen atorvastatin (LIPITOR) 20 MG tablet Take 20 mg by mouth every evening.       . B Complex-C-Zn-Folic Acid (DIALYVITE/ZINC PO) Take by mouth 3 (three) times a week. Patient has dialysis on Tuesdays, Thursdays, and Saturdays      . folic acid (FOLVITE) 1 MG tablet Take 1 mg by mouth daily.        . insulin glargine (LANTUS) 100 UNIT/ML injection Inject 10 Units into the skin at bedtime.      . metoprolol tartrate (LOPRESSOR) 25 MG tablet Take 0.5 tablets (12.5 mg total) by mouth 2 (two) times daily.    . metroNIDAZOLE (FLAGYL) 500 MG tablet Take 1 tablet (500 mg total) by mouth every 8 (eight) hours.    . midodrine (PROAMATINE) 5 MG tablet Take 1 tablet (5 mg total) by mouth 3 (three) times daily before meals.    . pantoprazole (PROTONIX) 40 MG tablet Take 40 mg by mouth 2 (two) times daily.     Marland Kitchen RENVELA 800 MG tablet Take 800 mg by mouth 3 (three) times daily with meals.     . SENSIPAR 30 MG tablet Take 30 mg by mouth daily.    . traMADol (ULTRAM) 50 MG tablet Take by mouth every 8 (eight) hours as needed for moderate pain.    . traZODone (DESYREL) 50 MG tablet Take 1 tablet (50 mg total) by mouth at bedtime.    COUMADIN 2.5 MG DAILY AND 5 MG ON WED.  Family History  Problem Relation Age of Onset  . Diabetes Mother   . Diabetes Father   . Diabetes Sister   . Colon cancer Neg Hx   . Colon polyps Neg Hx     Review of Systems PER HPI OTHERWISE ALL SYSTEMS ARE NEGATIVE.     Objective:   Physical Exam  Vitals reviewed. Constitutional: He is oriented to person, place, and time. He appears well-developed and well-nourished. No distress.  HENT:  Head: Normocephalic and atraumatic.  Mouth/Throat: Oropharynx is clear and moist. No oropharyngeal exudate.  Eyes: Pupils are equal, round, and reactive to light. No scleral icterus.  Neck: Normal range of motion. Neck supple.  Cardiovascular: Normal rate.   MID SYSTOLIC  CLICK, IRREGULAR RHYTHM  Pulmonary/Chest: Effort normal and breath sounds normal. No respiratory distress.  Abdominal: Soft. Bowel sounds are normal. He exhibits no distension. There is no tenderness.  Musculoskeletal: He exhibits no edema.  R BKA  Lymphadenopathy:    He has no cervical adenopathy.  Neurological: He is alert and oriented to person, place, and time.  Psychiatric:  FLAT AFFECT, NL MOOD           Assessment & Plan:

## 2014-03-11 NOTE — Patient Instructions (Signed)
FULL LIQUID DIET ON AUG 13. COLONOSCOPY AUG 14.   HOLD COUMADIN 4 DAYS PRIOR TO TCS.   HALF LANTUS ON NIGHT PRIOR TO TCS.  FOLLOW UP IN 2 MOS.    Full Liquid Diet A high-calorie, high-protein supplement should be used to meet your nutritional requirements when the full liquid diet is continued for more than 2 or 3 days. If this diet is to be used for an extended period of time (more than 7 days), a multivitamin should be considered.  Breads and Starches  Allowed: None are allowed except crackers WHOLE OR pureed (made into a thick, smooth soup) in soup. Cooked, refined corn, oat, rice, rye, and wheat cereals are also allowed.   Avoid: Any others.    Potatoes/Pasta/Rice  Allowed: ANY ITEM AS A SOUP OR SMALL PLATE OF MASHED POTATOES OR RICE.       Vegetables  Allowed: Strained tomato or vegetable juice. Vegetables pureed in soup.   Avoid: Any others.    Fruit  Allowed: Any strained fruit juices and fruit drinks. Include 1 serving of citrus or vitamin C-enriched fruit juice daily.   Avoid: Any others.  Meat and Meat Substitutes  Allowed: Egg  Avoid: Any meat, fish, or fowl. All cheese.  Milk  Allowed: Milk beverages, including milk shakes and instant breakfast mixes. Smooth yogurt.   Avoid: Any others. Avoid dairy products if not tolerated.    Soups and Combination Foods  Allowed: Broth, strained cream soups. Strained, broth-based soups.   Avoid: Any others.    Desserts and Sweets  Allowed: flavored gelatin, tapioca, plain ice cream, sherbet, smooth pudding, junket, fruit ices, frozen ice pops, pudding pops,, frozen fudge pops, chocolate syrup. Sugar, honey, jelly, syrup.   Avoid: Any others.  Fats and Oils  Allowed: Margarine, butter, cream, sour cream, oils.   Avoid: Any others.  Beverages  Allowed: All.   Avoid: None.  Condiments  Allowed: Iodized salt, pepper, spices, flavorings. Cocoa powder.   Avoid: Any others.    SAMPLE MEAL  PLAN Breakfast   cup orange juice.   1 cup cooked wheat cereal.   1 cup  milk.   1 cup beverage (coffee or tea).   Cream or sugar, if desired.    Midmorning Snack  2 SCRAMBLED OR HARD BOILED EGG   Lunch  1 cup cream soup.    cup fruit juice.   1 cup milk.    cup custard.   1 cup beverage (coffee or tea).   Cream or sugar, if desired.    Midafternoon Snack  1 cup milk shake.  Dinner  1 cup cream soup.    cup fruit juice.   1 cup milk.    cup pudding.   1 cup beverage (coffee or tea).   Cream or sugar, if desired.  Evening Snack  1 cup supplement.  To increase calories, add sugar, cream, butter, or margarine if possible. Nutritional supplements will also increase the total calories.

## 2014-03-11 NOTE — Progress Notes (Signed)
Reminder in epic °

## 2014-03-12 NOTE — Progress Notes (Signed)
cc'd to pcp 

## 2014-03-13 ENCOUNTER — Ambulatory Visit (INDEPENDENT_AMBULATORY_CARE_PROVIDER_SITE_OTHER): Payer: Medicare Other | Admitting: *Deleted

## 2014-03-13 DIAGNOSIS — Z952 Presence of prosthetic heart valve: Secondary | ICD-10-CM

## 2014-03-13 DIAGNOSIS — Z954 Presence of other heart-valve replacement: Secondary | ICD-10-CM

## 2014-03-13 LAB — POCT INR: INR: 2.9

## 2014-03-23 ENCOUNTER — Encounter (HOSPITAL_COMMUNITY): Payer: Self-pay | Admitting: Emergency Medicine

## 2014-03-23 ENCOUNTER — Telehealth: Payer: Self-pay | Admitting: *Deleted

## 2014-03-23 ENCOUNTER — Emergency Department (HOSPITAL_COMMUNITY): Payer: Medicare Other

## 2014-03-23 ENCOUNTER — Emergency Department (HOSPITAL_COMMUNITY)
Admission: EM | Admit: 2014-03-23 | Discharge: 2014-03-23 | Disposition: A | Discharge status: Home / Self Care | Payer: Medicare Other | Attending: Emergency Medicine | Admitting: Emergency Medicine

## 2014-03-23 DIAGNOSIS — E119 Type 2 diabetes mellitus without complications: Secondary | ICD-10-CM | POA: Diagnosis not present

## 2014-03-23 DIAGNOSIS — D638 Anemia in other chronic diseases classified elsewhere: Secondary | ICD-10-CM | POA: Diagnosis not present

## 2014-03-23 DIAGNOSIS — Z79899 Other long term (current) drug therapy: Secondary | ICD-10-CM | POA: Diagnosis not present

## 2014-03-23 DIAGNOSIS — Z992 Dependence on renal dialysis: Secondary | ICD-10-CM | POA: Insufficient documentation

## 2014-03-23 DIAGNOSIS — I2510 Atherosclerotic heart disease of native coronary artery without angina pectoris: Secondary | ICD-10-CM | POA: Insufficient documentation

## 2014-03-23 DIAGNOSIS — Z87891 Personal history of nicotine dependence: Secondary | ICD-10-CM | POA: Insufficient documentation

## 2014-03-23 DIAGNOSIS — Z8619 Personal history of other infectious and parasitic diseases: Secondary | ICD-10-CM | POA: Insufficient documentation

## 2014-03-23 DIAGNOSIS — Z9889 Other specified postprocedural states: Secondary | ICD-10-CM | POA: Insufficient documentation

## 2014-03-23 DIAGNOSIS — E785 Hyperlipidemia, unspecified: Secondary | ICD-10-CM | POA: Insufficient documentation

## 2014-03-23 DIAGNOSIS — E875 Hyperkalemia: Secondary | ICD-10-CM | POA: Diagnosis not present

## 2014-03-23 DIAGNOSIS — Z794 Long term (current) use of insulin: Secondary | ICD-10-CM | POA: Diagnosis not present

## 2014-03-23 DIAGNOSIS — Z951 Presence of aortocoronary bypass graft: Secondary | ICD-10-CM | POA: Insufficient documentation

## 2014-03-23 DIAGNOSIS — M109 Gout, unspecified: Secondary | ICD-10-CM | POA: Insufficient documentation

## 2014-03-23 DIAGNOSIS — I120 Hypertensive chronic kidney disease with stage 5 chronic kidney disease or end stage renal disease: Secondary | ICD-10-CM | POA: Insufficient documentation

## 2014-03-23 DIAGNOSIS — R0602 Shortness of breath: Secondary | ICD-10-CM | POA: Diagnosis present

## 2014-03-23 DIAGNOSIS — R079 Chest pain, unspecified: Secondary | ICD-10-CM

## 2014-03-23 DIAGNOSIS — Z7901 Long term (current) use of anticoagulants: Secondary | ICD-10-CM | POA: Insufficient documentation

## 2014-03-23 DIAGNOSIS — N186 End stage renal disease: Secondary | ICD-10-CM | POA: Insufficient documentation

## 2014-03-23 DIAGNOSIS — I4891 Unspecified atrial fibrillation: Secondary | ICD-10-CM | POA: Diagnosis not present

## 2014-03-23 LAB — CBC WITH DIFFERENTIAL/PLATELET
BASOS ABS: 0 10*3/uL (ref 0.0–0.1)
Basophils Relative: 0 % (ref 0–1)
EOS PCT: 2 % (ref 0–5)
Eosinophils Absolute: 0.2 10*3/uL (ref 0.0–0.7)
HCT: 32.8 % — ABNORMAL LOW (ref 39.0–52.0)
Hemoglobin: 10.2 g/dL — ABNORMAL LOW (ref 13.0–17.0)
LYMPHS ABS: 1.2 10*3/uL (ref 0.7–4.0)
LYMPHS PCT: 14 % (ref 12–46)
MCH: 30.2 pg (ref 26.0–34.0)
MCHC: 31.1 g/dL (ref 30.0–36.0)
MCV: 97 fL (ref 78.0–100.0)
MONO ABS: 0.8 10*3/uL (ref 0.1–1.0)
Monocytes Relative: 9 % (ref 3–12)
NEUTROS ABS: 6.4 10*3/uL (ref 1.7–7.7)
Neutrophils Relative %: 75 % (ref 43–77)
Platelets: 308 10*3/uL (ref 150–400)
RBC: 3.38 MIL/uL — ABNORMAL LOW (ref 4.22–5.81)
RDW: 17.1 % — AB (ref 11.5–15.5)
WBC: 8.6 10*3/uL (ref 4.0–10.5)

## 2014-03-23 LAB — BASIC METABOLIC PANEL
ANION GAP: 17 — AB (ref 5–15)
BUN: 27 mg/dL — ABNORMAL HIGH (ref 6–23)
CALCIUM: 8.8 mg/dL (ref 8.4–10.5)
CHLORIDE: 97 meq/L (ref 96–112)
CO2: 27 meq/L (ref 19–32)
Creatinine, Ser: 7.31 mg/dL — ABNORMAL HIGH (ref 0.50–1.35)
GFR calc Af Amer: 8 mL/min — ABNORMAL LOW (ref >90)
GFR calc non Af Amer: 7 mL/min — ABNORMAL LOW (ref >90)
GLUCOSE: 169 mg/dL — AB (ref 70–99)
Potassium: 5.9 mEq/L — ABNORMAL HIGH (ref 3.7–5.3)
SODIUM: 141 meq/L (ref 137–147)

## 2014-03-23 LAB — TROPONIN I: Troponin I: <0.3 ng/mL (ref <0.30)

## 2014-03-23 MED ORDER — SODIUM POLYSTYRENE SULFONATE 15 GM/60ML PO SUSP
45.0000 g | Freq: Once | ORAL | Status: AC
  Administered 2014-03-23: 45 g via ORAL
  Filled 2014-03-23: qty 180

## 2014-03-23 NOTE — Telephone Encounter (Signed)
Message copied by Malen Gauze on Mon Mar 23, 2014  1:51 PM ------      Message from: Elberta Leatherwood R      Created: Fri Mar 13, 2014 10:14 AM       CHADS of 2 and I didn't see history of stroke so as Jeanbaptiste as that is true okay to not bridge.  He just got out of the hospital for elevated INR and bleeding and has been on Flagyl- might be an interesting visit this afternoon!             ----- Message -----         From: Malen Gauze, RN         Sent: 03/13/2014   8:59 AM           To: Aris Georgia, RPH, Malen Gauze, RN            Gay Filler,      Please advise      Lattie Haw      ----- Message -----         From: Satira Sark, MD         Sent: 03/12/2014   2:53 PM           To: Malen Gauze, RN, [name], MD, #            Lattie Haw,            I just met this gentleman in April. He is medically complex - has a history of bioprosthetic mitral valve replacement with pericardial valve for severe MR along with CABG x4, left atrial appendage closure and right-sided Maze procedure because of preoperative AF with rapid VR by Dr. Servando Snare in May of 2008. Please review my office note. He is now followed in the [facility] Coumadin clinic, previously at [facility]. My understanding is that colonoscopy is being planned by Dr. Oneida Alar. Could you please touch base with Gay Filler regarding how to transition this patient off of Coumadin safely. When this is determined, please coordinate this with Dr. Oneida Alar office. Thank you.            SGM            ----- Message -----         From: Arnoldo Lenis, MD         Sent: 03/12/2014   1:18 PM           To: Satira Sark, MD            Nino Parsley Fields contacted the office one day I was in hospital about recs for this patient's coumadin with upcoming colonoscopy. I see that you saw him just a few months, can you forward your recs on to them through [place] or [place]                   ------

## 2014-03-23 NOTE — ED Notes (Signed)
Pt states pain to LUQ x 2 days. Pt  Also states he feels a little SOB  Sometimes also over the past two days. Pt is scheduled for dialysis tomorrow.

## 2014-03-23 NOTE — Telephone Encounter (Signed)
How many days do you need pt off coumadin?  I will be seeing him back in coumadin clinic on 04/03/14.

## 2014-03-23 NOTE — Discharge Instructions (Signed)

## 2014-03-23 NOTE — ED Notes (Signed)
Bedside commode place in room for patient convenience

## 2014-03-23 NOTE — Telephone Encounter (Signed)
Dr Oneida Alar, See below regarding holding coumadin before upcoming colonoscopy.

## 2014-03-24 NOTE — Progress Notes (Signed)
Called and informed pts wife

## 2014-03-24 NOTE — Progress Notes (Signed)
RECEIVED MESSAGE FROM DR. MCDOWELL/LIS REID. SPOKE WITH LISA REID. THEY REVIEWED PT'S CASE WITH PHARMACY. NO LOVENOX BRIDGE NEEDED PRE- OR POST TCS. PT WILL HOLD COUMADIN AUG 11. CHECK PT/INR IN PRE-OP AUG 14.

## 2014-03-24 NOTE — Progress Notes (Signed)
PLEASE CALL PT. HE SHOULD HOLD COUMADIN AUG 11. CHECK PT/INR IN PRE-OP AUG 14.

## 2014-03-26 ENCOUNTER — Other Ambulatory Visit: Payer: Self-pay

## 2014-03-26 NOTE — Progress Notes (Signed)
PT/INR ordered has been added to orders.

## 2014-03-27 ENCOUNTER — Encounter (HOSPITAL_COMMUNITY): Payer: Self-pay | Admitting: Pharmacy Technician

## 2014-03-27 NOTE — ED Provider Notes (Signed)
CSN: [id number]     Arrival date & time 03/23/14  1730 History   First MD Initiated Contact with Patient 03/23/14 1755     Chief Complaint  Patient presents with  . Abdominal Pain  . Shortness of Breath     (Consider location/radiation/quality/duration/timing/severity/associated sxs/prior Treatment) HPI  63 year old male with chest pain. Triage note mentions left upper quadrant. He points to his left anterior chest. He is telling me it as chest pain, not abdominal pain. Onset 2 days ago. Waxes and wanes without any appreciable exacerbating relieving factors. Specifically denies any change with exertion. No acute respiratory complaints. No fever or chills. No dizziness or lightheadedness. End stage renal disease on dialysis.   Past Medical History  Diagnosis Date  . UGI bleed 02/12/11    On anticoagulation; EGD w/snare polypectomy-multiple polypoid lesions antrum(benign), Chronic active gastritis (NEGATIVE H pylori)  . Anemia, chronic disease   . Sepsis due to enterococcus 02/11/11  . Atrial fibrillation     On coumadin  . Coronary atherosclerosis of native coronary artery     Multivessel status post CABG  . Cardiomyopathy     LVEF 40-45% 01/2011  . Type 2 diabetes mellitus   . Essential hypertension, benign   . Hyperlipemia   . Gout   . S/P patent foramen ovale closure   . ESRD on hemodialysis     Started dialysis sometime around 2008-2009.  Gets HD in [place], [place] with Dr Hinda Lenis on a TTS schedule.  Runs 3.5 hours with dry wt 86.5 kg as of June 2015.  Had a L arm AVF which didn't work and has been using his R forearm AVF for several years.    . History of nuclear stress test 07/04/2010    dipyridamole; mod fixed inferolateral defect; non-diagnostic for ischemia; low risk scan   . Hyperbilirubinemia 08/10/2013  . SBO (small bowel obstruction) 01/2014  . Pneumatosis coli 01/2014    of cecum  . Clostridium difficile colitis 01/2014  . Chronic anticoagulation     coumadin  .  Diverticulosis   . Internal hemorrhoids   . S/P CABG (coronary artery bypass graft) 07/07/2013    2008 at [facility]    Past Surgical History  Procedure Laterality Date  . Mitral valve replacement  01/05/2007    Bioprosthetic 79mm Edwards precordial tissue (Dr. Servando Snare)  . Coronary artery bypass graft  01/05/2007    LIMA-LAD, SVG-OM, seq. SVG-PDA, PLA-RCA  . Colonoscopy  06/23/2011    Dr. Oneida Alar: multiple sessile and pedunculated polyps, moderate diverticulosis, internal hemorrhoids, +ADENOMATOUS POLYPS, consider genetic testing, surveillance in October 2015   . Esophagogastroduodenoscopy  02/12/2011    CHRONIC ACTIVE GASTRITIS, NO H. pylori  . Transthoracic echocardiogram  11/2011    EF 25% w/ mod dilatation of LV & mod LVH; LA severely dilated; mod-severe dilation of RV with mod-severe decrease in RV function; mild MR & TR  . Cardioversion  04/19/2007    Dr. Marella Chimes  . Transthoracic echocardiogram  02/14/2011    EF 40-45%, mod conc LVH; ventricular septum with motion showing abnormal function, dyssynergy, paradox; mild AS; mild-mod MR stenosis; LA severely dilated; aptrial septum bowed from L to R with increased LA pressure   . Amputation Right 07/14/2013    Procedure: AMPUTATION DIGIT;  Surgeon: [name], MD;  Location: [facility];  Service: General;  Laterality: Right;  . Amputation Right 08/08/2013    Procedure: AMPUTATION BELOW KNEE;  Surgeon: [name], MD;  Location: [facility];  Service: General;  Laterality: Right;  . Stump revision Right 09/19/2013    Procedure: STUMP REVISION;  Surgeon: Scherry Ran, MD;  Location: [facility];  Service: General;  Laterality: Right;  . Amputation Right 10/17/2013    Procedure: AMPUTATION ABOVE KNEE;  Surgeon: [name], MD;  Location: [facility];  Service: General;  Laterality: Right;   Family History  Problem Relation Age of Onset  . Diabetes Mother   . Diabetes Father   . Diabetes Sister   . Colon cancer Neg Hx   . Colon polyps  Neg Hx    History  Substance Use Topics  . Smoking status: Former Smoker -- 1.00 packs/day for 8 years    Types: Cigarettes    Quit date: 11/27/2006  . Smokeless tobacco: Never Used     Comment: quit about 5 yrs  . Alcohol Use: No    Review of Systems  All systems reviewed and negative, other than as noted in HPI.   Allergies  Bacitracin and Norvasc  Home Medications   Prior to Admission medications   Medication Sig Start Date End Date Taking? Authorizing Provider  albuterol (PROVENTIL) (2.5 MG/3ML) 0.083% nebulizer solution Take 3 mLs (2.5 mg total) by nebulization every 2 (two) hours as needed for wheezing. 02/21/14  Yes Kinnie Feil, MD  allopurinol (ZYLOPRIM) 100 MG tablet Take 100 mg by mouth daily.  05/15/11  Yes Historical Provider, MD  amiodarone (PACERONE) 100 MG tablet Take 100 mg by mouth daily.   Yes Historical Provider, MD  atorvastatin (LIPITOR) 20 MG tablet Take 20 mg by mouth every evening.  02/07/13  Yes Rebecca Eaton, MD  B Complex-C-Zn-Folic Acid (DIALYVITE/ZINC PO) Take by mouth 3 (three) times a week. Patient has dialysis on Tuesdays, Thursdays, and Saturdays   Yes Historical Provider, MD  folic acid (FOLVITE) 1 MG tablet Take 1 mg by mouth daily.     Yes Historical Provider, MD  insulin glargine (LANTUS) 100 UNIT/ML injection Inject 10 Units into the skin at bedtime.   Yes Historical Provider, MD  metoprolol tartrate (LOPRESSOR) 25 MG tablet Take 0.5 tablets (12.5 mg total) by mouth 2 (two) times daily. 03/03/14  Yes Allie Bossier, MD  midodrine (PROAMATINE) 5 MG tablet Take 1 tablet (5 mg total) by mouth 3 (three) times daily before meals. 02/21/14  Yes Kinnie Feil, MD  pantoprazole (PROTONIX) 40 MG tablet Take 40 mg by mouth 2 (two) times daily.  05/25/11  Yes Historical Provider, MD  RENVELA 800 MG tablet Take 800 mg by mouth 3 (three) times daily with meals.  05/03/11  Yes Historical Provider, MD  SENSIPAR 30 MG tablet Take 30 mg by mouth daily.  01/22/14  Yes Historical Provider, MD  traMADol (ULTRAM) 50 MG tablet Take by mouth every 8 (eight) hours as needed for moderate pain.   Yes Historical Provider, MD  traZODone (DESYREL) 50 MG tablet Take 1 tablet (50 mg total) by mouth at bedtime. 08/13/13  Yes Nita Sells, MD  warfarin (COUMADIN) 5 MG tablet Take 2.5-5 mg by mouth daily. Takes 2.5mg  on all dasy except takes 5mg  on Wednesdays   Yes Historical Provider, MD   BP 142/90  Pulse 99  Temp(Src) 98.7 F (37.1 C) (Oral)  Resp 18  Ht 6\' 4"  (1.93 m)  Wt 192 lb (87.091 kg)  BMI 23.38 kg/m2  SpO2 100% Physical Exam  Nursing note and vitals reviewed. Constitutional: He appears well-developed and well-nourished. No distress.  HENT:  Head: Normocephalic and atraumatic.  Eyes: Conjunctivae are normal. Right eye exhibits no discharge. Left eye exhibits no discharge.  Neck: Neck supple.  Cardiovascular: Normal rate, regular rhythm and normal heart sounds.  Exam reveals no gallop and no friction rub.   No murmur heard. AV fistula right upper extremity  Pulmonary/Chest: Effort normal and breath sounds normal. No respiratory distress. He exhibits no tenderness.  Abdominal: Soft. He exhibits no distension. There is no tenderness.  Musculoskeletal: He exhibits no edema and no tenderness.  Neurological: He is alert.  Skin: Skin is warm and dry.  Psychiatric: He has a normal mood and affect. His behavior is normal. Thought content normal.    ED Course  Procedures (including critical care time) Labs Review Labs Reviewed  CBC WITH DIFFERENTIAL - Abnormal; Notable for the following:    RBC 3.38 (*)    Hemoglobin 10.2 (*)    HCT 32.8 (*)    RDW 17.1 (*)    All other components within normal limits  BASIC METABOLIC PANEL - Abnormal; Notable for the following:    Potassium 5.9 (*)    Glucose, Bld 169 (*)    BUN 27 (*)    Creatinine, Ser 7.31 (*)    GFR calc non Af Amer 7 (*)    GFR calc Af Amer 8 (*)    Anion gap 17 (*)     All other components within normal limits  TROPONIN I    Imaging Review No results found.   EKG Interpretation   Date/Time:  Monday March 23 2014 18:03:26 EDT Ventricular Rate:  99 PR Interval:    QRS Duration: 112 QT Interval:  441 QTC Calculation: 566 R Axis:   7 Text Interpretation:  Accelerated junctional rhythm versus afib Anterior  infarct, old Non-specific intra-ventricular conduction delay Nonspecific T  abnormalities, lateral leads Prolonged QT interval No significant change  since last tracing Confirmed by Brittyn Salaz  MD, Panfilo Ketchum ([id number]) on 03/23/2014  7:05:42 PM      MDM   Final diagnoses:  Chest pain, unspecified chest pain type  Hyperkalemia    63 year old male with left chest pain. Atypical for ACS. Doubt pulmonary embolism infectious, dissection or other potential emergent process. Patient does not have a normal EKG, but it does not appear to be significantly changed from priors. End stage renal disease. Potassium is elevated at 5.9. He was given Kayexalate in the emergency room. He is scheduled for dialysis tomorrow. Is appropriate for discharge. Return precautions were discussed.  [name] Manifold, MD 03/28/14 [phone]

## 2014-04-03 ENCOUNTER — Ambulatory Visit (INDEPENDENT_AMBULATORY_CARE_PROVIDER_SITE_OTHER): Payer: Medicare Other | Admitting: *Deleted

## 2014-04-03 DIAGNOSIS — Z954 Presence of other heart-valve replacement: Secondary | ICD-10-CM

## 2014-04-03 DIAGNOSIS — I4891 Unspecified atrial fibrillation: Secondary | ICD-10-CM

## 2014-04-03 DIAGNOSIS — Z7901 Long term (current) use of anticoagulants: Secondary | ICD-10-CM

## 2014-04-03 DIAGNOSIS — Z952 Presence of prosthetic heart valve: Secondary | ICD-10-CM

## 2014-04-03 LAB — POCT INR: INR: 3.6

## 2014-04-09 ENCOUNTER — Other Ambulatory Visit: Payer: Self-pay | Admitting: Gastroenterology

## 2014-04-09 DIAGNOSIS — K6389 Other specified diseases of intestine: Secondary | ICD-10-CM

## 2014-04-09 DIAGNOSIS — R634 Abnormal weight loss: Secondary | ICD-10-CM

## 2014-04-09 DIAGNOSIS — K922 Gastrointestinal hemorrhage, unspecified: Secondary | ICD-10-CM

## 2014-04-09 DIAGNOSIS — D126 Benign neoplasm of colon, unspecified: Secondary | ICD-10-CM

## 2014-04-10 ENCOUNTER — Encounter (HOSPITAL_COMMUNITY)
Admission: RE | Disposition: A | Discharge status: Home / Self Care | Payer: Self-pay | Source: Ambulatory Visit | Attending: Gastroenterology

## 2014-04-10 ENCOUNTER — Ambulatory Visit (HOSPITAL_COMMUNITY)
Admission: RE | Admit: 2014-04-10 | Discharge: 2014-04-10 | Disposition: A | Discharge status: Home / Self Care | Payer: Medicare Other | Source: Ambulatory Visit | Attending: Gastroenterology | Admitting: Gastroenterology

## 2014-04-10 ENCOUNTER — Encounter (HOSPITAL_COMMUNITY): Payer: Self-pay | Admitting: *Deleted

## 2014-04-10 ENCOUNTER — Telehealth: Payer: Self-pay | Admitting: *Deleted

## 2014-04-10 DIAGNOSIS — Z954 Presence of other heart-valve replacement: Secondary | ICD-10-CM | POA: Insufficient documentation

## 2014-04-10 DIAGNOSIS — M109 Gout, unspecified: Secondary | ICD-10-CM | POA: Insufficient documentation

## 2014-04-10 DIAGNOSIS — Z888 Allergy status to other drugs, medicaments and biological substances status: Secondary | ICD-10-CM | POA: Insufficient documentation

## 2014-04-10 DIAGNOSIS — S88119A Complete traumatic amputation at level between knee and ankle, unspecified lower leg, initial encounter: Secondary | ICD-10-CM | POA: Diagnosis not present

## 2014-04-10 DIAGNOSIS — K625 Hemorrhage of anus and rectum: Secondary | ICD-10-CM | POA: Diagnosis present

## 2014-04-10 DIAGNOSIS — K648 Other hemorrhoids: Secondary | ICD-10-CM | POA: Insufficient documentation

## 2014-04-10 DIAGNOSIS — I120 Hypertensive chronic kidney disease with stage 5 chronic kidney disease or end stage renal disease: Secondary | ICD-10-CM | POA: Diagnosis not present

## 2014-04-10 DIAGNOSIS — D126 Benign neoplasm of colon, unspecified: Secondary | ICD-10-CM

## 2014-04-10 DIAGNOSIS — Z992 Dependence on renal dialysis: Secondary | ICD-10-CM | POA: Insufficient documentation

## 2014-04-10 DIAGNOSIS — K6389 Other specified diseases of intestine: Secondary | ICD-10-CM

## 2014-04-10 DIAGNOSIS — E785 Hyperlipidemia, unspecified: Secondary | ICD-10-CM | POA: Diagnosis not present

## 2014-04-10 DIAGNOSIS — N186 End stage renal disease: Secondary | ICD-10-CM | POA: Diagnosis not present

## 2014-04-10 DIAGNOSIS — K922 Gastrointestinal hemorrhage, unspecified: Secondary | ICD-10-CM

## 2014-04-10 DIAGNOSIS — K5730 Diverticulosis of large intestine without perforation or abscess without bleeding: Secondary | ICD-10-CM | POA: Diagnosis not present

## 2014-04-10 DIAGNOSIS — Z951 Presence of aortocoronary bypass graft: Secondary | ICD-10-CM | POA: Insufficient documentation

## 2014-04-10 DIAGNOSIS — K633 Ulcer of intestine: Secondary | ICD-10-CM | POA: Insufficient documentation

## 2014-04-10 DIAGNOSIS — Z7901 Long term (current) use of anticoagulants: Secondary | ICD-10-CM | POA: Insufficient documentation

## 2014-04-10 DIAGNOSIS — I2510 Atherosclerotic heart disease of native coronary artery without angina pectoris: Secondary | ICD-10-CM | POA: Diagnosis not present

## 2014-04-10 DIAGNOSIS — E119 Type 2 diabetes mellitus without complications: Secondary | ICD-10-CM | POA: Insufficient documentation

## 2014-04-10 DIAGNOSIS — Z794 Long term (current) use of insulin: Secondary | ICD-10-CM | POA: Diagnosis not present

## 2014-04-10 DIAGNOSIS — I4891 Unspecified atrial fibrillation: Secondary | ICD-10-CM | POA: Diagnosis not present

## 2014-04-10 DIAGNOSIS — R634 Abnormal weight loss: Secondary | ICD-10-CM

## 2014-04-10 HISTORY — PX: COLONOSCOPY: SHX5424

## 2014-04-10 LAB — PROTIME-INR
INR: 1.58 — AB (ref 0.00–1.49)
PROTHROMBIN TIME: 18.9 s — AB (ref 11.6–15.2)

## 2014-04-10 LAB — GLUCOSE, CAPILLARY
GLUCOSE-CAPILLARY: 114 mg/dL — AB (ref 70–99)
Glucose-Capillary: 64 mg/dL — ABNORMAL LOW (ref 70–99)

## 2014-04-10 SURGERY — COLONOSCOPY
Anesthesia: Moderate Sedation

## 2014-04-10 MED ORDER — STERILE WATER FOR IRRIGATION IR SOLN
Status: DC | PRN
  Administered 2014-04-10: 09:00:00

## 2014-04-10 MED ORDER — MEPERIDINE HCL 100 MG/ML IJ SOLN
INTRAMUSCULAR | Status: AC
  Filled 2014-04-10: qty 2

## 2014-04-10 MED ORDER — SIMETHICONE 40 MG/0.6ML PO SUSP
ORAL | Status: AC
  Filled 2014-04-10: qty 0.6

## 2014-04-10 MED ORDER — DEXTROSE IN LACTATED RINGERS 5 % IV SOLN
Freq: Once | INTRAVENOUS | Status: AC
  Administered 2014-04-10: 08:00:00 via INTRAVENOUS

## 2014-04-10 MED ORDER — FENTANYL CITRATE 0.05 MG/ML IJ SOLN
INTRAMUSCULAR | Status: DC | PRN
  Administered 2014-04-10: 25 ug via INTRAVENOUS

## 2014-04-10 MED ORDER — FENTANYL CITRATE 0.05 MG/ML IJ SOLN
INTRAMUSCULAR | Status: AC
  Filled 2014-04-10: qty 2

## 2014-04-10 MED ORDER — MIDAZOLAM HCL 5 MG/5ML IJ SOLN
INTRAMUSCULAR | Status: DC | PRN
  Administered 2014-04-10 (×2): 2 mg via INTRAVENOUS

## 2014-04-10 MED ORDER — SODIUM CHLORIDE 0.9 % IV SOLN
INTRAVENOUS | Status: DC
  Administered 2014-04-10: 08:00:00 via INTRAVENOUS

## 2014-04-10 MED ORDER — MIDAZOLAM HCL 5 MG/5ML IJ SOLN
INTRAMUSCULAR | Status: AC
  Filled 2014-04-10: qty 10

## 2014-04-10 NOTE — Interval H&P Note (Signed)
History and Physical Interval Note:  04/10/2014 8:42 AM  [name] [name]  has presented today for surgery, with the diagnosis of Screening  The various methods of treatment have been discussed with the patient and family. After consideration of risks, benefits and other options for treatment, the patient has consented to  Procedure(s) with comments: COLONOSCOPY (N/A) - 830 as a surgical intervention .  The patient's history has been reviewed, patient examined, no change in status, stable for surgery.  I have reviewed the patient's chart and labs.  Questions were answered to the patient's satisfaction.     [employer]

## 2014-04-10 NOTE — Discharge Instructions (Signed)
YOUR RECTAL BLEEDING IS MOST LIKELY DUE TO AN ULCER IN YOUR RIGHT COLON. You had 3 polyps removed. You have diverticulosis and small internal hemorrhoids.    NO COUMADIN OR HEPARIN WITH DIALYSIS FOR 7 DAYS.  FOLLOW A HIGH FIBER DIET. AVOID ITEMS THAT CAUSE BLOATING AND GAS. SEE INFO BELOW.  YOUR BIOPSY RESULTS SHOULD BE BACK IN 7 DAYS.  Next colonoscopy in 1-3 years.   Colonoscopy Care After Read the instructions outlined below and refer to this sheet in the next week. These discharge instructions provide you with general information on caring for yourself after you leave the hospital. While your treatment has been planned according to the most current medical practices available, unavoidable complications occasionally occur. If you have any problems or questions after discharge, call DR. Eusebio Blazejewski, [phone].  ACTIVITY  You may resume your regular activity, but move at a slower pace for the next 24 hours.   Take frequent rest periods for the next 24 hours.   Walking will help get rid of the air and reduce the bloated feeling in your belly (abdomen).   No driving for 24 hours (because of the medicine (anesthesia) used during the test).   You may shower.   Do not sign any important legal documents or operate any machinery for 24 hours (because of the anesthesia used during the test).    NUTRITION  Drink plenty of fluids.   You may resume your normal diet as instructed by your doctor.   Begin with a light meal and progress to your normal diet. Heavy or fried foods are harder to digest and may make you feel sick to your stomach (nauseated).   Avoid alcoholic beverages for 24 hours or as instructed.    MEDICATIONS  You may resume your normal medications.   WHAT YOU CAN EXPECT TODAY  Some feelings of bloating in the abdomen.   Passage of more gas than usual.   Spotting of blood in your stool or on the toilet paper  .  IF YOU HAD POLYPS REMOVED DURING THE  COLONOSCOPY:  Eat a soft diet IF YOU HAVE NAUSEA, BLOATING, ABDOMINAL PAIN, OR VOMITING.    FINDING OUT THE RESULTS OF YOUR TEST Not all test results are available during your visit. DR. Oneida Alar WILL CALL YOU WITHIN 7 DAYS OF YOUR PROCEDUE WITH YOUR RESULTS. Do not assume everything is normal if you have not heard from DR. Antione Obar IN ONE WEEK, CALL HER OFFICE AT [phone].  SEEK IMMEDIATE MEDICAL ATTENTION AND CALL THE OFFICE: [phone] IF:  You have more than a spotting of blood in your stool.   Your belly is swollen (abdominal distention).   You are nauseated or vomiting.   You have a temperature over 101F.   You have abdominal pain or discomfort that is severe or gets worse throughout the day.   High-Fiber Diet A high-fiber diet changes your normal diet to include more whole grains, legumes, fruits, and vegetables. Changes in the diet involve replacing refined carbohydrates with unrefined foods. The calorie level of the diet is essentially unchanged. The Dietary Reference Intake (recommended amount) for adult males is 38 grams per day. For adult females, it is 25 grams per day. Pregnant and lactating women should consume 28 grams of fiber per day. Fiber is the intact part of a plant that is not broken down during digestion. Functional fiber is fiber that has been isolated from the plant to provide a beneficial effect in the body. PURPOSE  Increase stool  bulk.   Ease and regulate bowel movements.   Lower cholesterol.  INDICATIONS THAT YOU NEED MORE FIBER  Constipation and hemorrhoids.   Uncomplicated diverticulosis (intestine condition) and irritable bowel syndrome.   Weight management.   As a protective measure against hardening of the arteries (atherosclerosis), diabetes, and cancer.   GUIDELINES FOR INCREASING FIBER IN THE DIET  Start adding fiber to the diet slowly. A gradual increase of about 5 more grams (2 slices of whole-wheat bread, 2 servings of most fruits  or vegetables, or 1 bowl of high-fiber cereal) per day is best. Too rapid an increase in fiber may result in constipation, flatulence, and bloating.   Drink enough water and fluids to keep your urine clear or pale yellow. Water, juice, or caffeine-free drinks are recommended. Not drinking enough fluid may cause constipation.   Eat a variety of high-fiber foods rather than one type of fiber.   Try to increase your intake of fiber through using high-fiber foods rather than fiber pills or supplements that contain small amounts of fiber.   The goal is to change the types of food eaten. Do not supplement your present diet with high-fiber foods, but replace foods in your present diet.  INCLUDE A VARIETY OF FIBER SOURCES  Replace refined and processed grains with whole grains, canned fruits with fresh fruits, and incorporate other fiber sources. White rice, white breads, and most bakery goods contain little or no fiber.   Brown whole-grain rice, buckwheat oats, and many fruits and vegetables are all good sources of fiber. These include: broccoli, Brussels sprouts, cabbage, cauliflower, beets, sweet potatoes, white potatoes (skin on), carrots, tomatoes, eggplant, squash, berries, fresh fruits, and dried fruits.   Cereals appear to be the richest source of fiber. Cereal fiber is found in whole grains and bran. Bran is the fiber-rich outer coat of cereal grain, which is largely removed in refining. In whole-grain cereals, the bran remains. In breakfast cereals, the largest amount of fiber is found in those with "bran" in their names. The fiber content is sometimes indicated on the label.   You may need to include additional fruits and vegetables each day.   In baking, for 1 cup white flour, you may use the following substitutions:   1 cup whole-wheat flour minus 2 tablespoons.   1/2 cup white flour plus 1/2 cup whole-wheat flour.   Polyps, Colon  A polyp is extra tissue that grows inside your body.  Colon polyps grow in the large intestine. The large intestine, also called the colon, is part of your digestive system. It is a Wen[place], hollow tube at the end of your digestive tract where your body makes and stores stool. Most polyps are not dangerous. They are benign. This means they are not cancerous. But over time, some types of polyps can turn into cancer. Polyps that are smaller than a pea are usually not harmful. But larger polyps could someday become or may already be cancerous. To be safe, doctors remove all polyps and test them.   WHO GETS POLYPS? Anyone can get polyps, but certain people are more likely than others. You may have a greater chance of getting polyps if:  You are over 50.   You have had polyps before.   Someone in your family has had polyps.   Someone in your family has had cancer of the large intestine.   Find out if someone in your family has had polyps. You may also be more likely to get polyps  if you:   Eat a lot of fatty foods   Smoke   Drink alcohol   Do not exercise  Eat too much   TREATMENT  The caregiver will remove the polyp during sigmoidoscopy or colonoscopy.    PREVENTION There is not one sure way to prevent polyps. You might be able to lower your risk of getting them if you:  Eat more fruits and vegetables and less fatty food.   Do not smoke.   Avoid alcohol.   Exercise every day.   Lose weight if you are overweight.   Eating more calcium and folate can also lower your risk of getting polyps. Some foods that are rich in calcium are milk, cheese, and broccoli. Some foods that are rich in folate are chickpeas, kidney beans, and spinach.   Hemorrhoids Hemorrhoids are dilated (enlarged) veins around the rectum. Sometimes clots will form in the veins. This makes them swollen and painful. These are called thrombosed hemorrhoids. Causes of hemorrhoids include:  Constipation.   Straining to have a bowel movement.   HEAVY LIFTING HOME CARE  INSTRUCTIONS  Eat a well balanced diet and drink 6 to 8 glasses of water every day to avoid constipation. You may also use a bulk laxative.   Avoid straining to have bowel movements.   Keep anal area dry and clean.   Do not use a donut shaped pillow or sit on the toilet for Morrical periods. This increases blood pooling and pain.   Move your bowels when your body has the urge; this will require less straining and will decrease pain and pressure.

## 2014-04-10 NOTE — H&P (View-Only) (Signed)
Subjective:    Patient ID: [name] [name], male    DOB: [birth year], 63 y.o.   MRN: [id number]  [name], MD  HPI NO BLOOD IN STOOL OR PROBLEMS SWALLOWING. NO ABD PAIN CURRENTLY. BMs: 3-4 day(SOFT). BACK ON COUMADIN 2.5G MG QD EXCEPT WED 5 MG. MISSED APPT ON JUN 26 BECAUSE HE WAS IN THE HOSPITAL.   PT DENIES FEVER, CHILLS, nausea, vomiting, melena, diarrhea, CHEST PAIN, SHORTNESS OF BREATH,  CHANGE IN BOWEL IN HABITS, constipation, abdominal pain, problems swallowing, OR heartburn or indigestion.  Past Medical History  Diagnosis Date  . UGI bleed 02/12/11    On anticoagulation; EGD w/snare polypectomy-multiple polypoid lesions antrum(benign), Chronic active gastritis (NEGATIVE H pylori)  . Anemia, chronic disease   . Sepsis due to enterococcus 02/11/11  . Atrial fibrillation     On coumadin  . Coronary atherosclerosis of native coronary artery     Multivessel status post CABG  . Cardiomyopathy     LVEF 40-45% 01/2011  . Type 2 diabetes mellitus   . Essential hypertension, benign   . Hyperlipemia   . Gout   . S/P patent foramen ovale closure   . ESRD on hemodialysis     Started dialysis sometime around 2008-2009.  Gets HD in [place], [place] with Dr Hinda Lenis on a TTS schedule.  Runs 3.5 hours with dry wt 86.5 kg as of June 2015.  Had a L arm AVF which didn't work and has been using his R forearm AVF for several years.    . History of nuclear stress test 07/04/2010    dipyridamole; mod fixed inferolateral defect; non-diagnostic for ischemia; low risk scan   . Hyperbilirubinemia 08/10/2013  . SBO (small bowel obstruction) 01/2014  . Pneumatosis coli 01/2014    of cecum  . Clostridium difficile colitis 01/2014  . Chronic anticoagulation     coumadin  . Diverticulosis   . Internal hemorrhoids    Past Surgical History  Procedure Laterality Date  . Mitral valve replacement  01/05/2007    Bioprosthetic 88mm Edwards precordial tissue (Dr. Servando Snare)  . Coronary artery bypass graft   01/05/2007    LIMA-LAD, SVG-OM, seq. SVG-PDA, PLA-RCA  . Colonoscopy  06/23/2011    Dr. Oneida Alar: multiple sessile and pedunculated polyps, moderate diverticulosis, internal hemorrhoids, +ADENOMATOUS POLYPS, consider genetic testing, surveillance in October 2015   . Esophagogastroduodenoscopy  02/12/2011    CHRONIC ACTIVE GASTRITIS, NO H. pylori  . Transthoracic echocardiogram  11/2011    EF 25% w/ mod dilatation of LV & mod LVH; LA severely dilated; mod-severe dilation of RV with mod-severe decrease in RV function; mild MR & TR  . Cardioversion  04/19/2007    Dr. Marella Chimes  . Transthoracic echocardiogram  02/14/2011    EF 40-45%, mod conc LVH; ventricular septum with motion showing abnormal function, dyssynergy, paradox; mild AS; mild-mod MR stenosis; LA severely dilated; aptrial septum bowed from L to R with increased LA pressure   . Amputation Right 07/14/2013    Procedure: AMPUTATION DIGIT;  Surgeon: [name], MD;  Location: [facility];  Service: General;  Laterality: Right;  . Amputation Right 08/08/2013    Procedure: AMPUTATION BELOW KNEE;  Surgeon: [name], MD;  Location: [facility];  Service: General;  Laterality: Right;  . Stump revision Right 09/19/2013    Procedure: STUMP REVISION;  Surgeon: Scherry Ran, MD;  Location: [facility];  Service: General;  Laterality: Right;  . Amputation Right 10/17/2013    Procedure: AMPUTATION ABOVE  KNEE;  Surgeon: [name], MD;  Location: [facility];  Service: General;  Laterality: Right;    Allergies  Allergen Reactions  . Bacitracin Other (See Comments)    unknown  . Norvasc [Amlodipine Besylate] Other (See Comments)    unknown    Current Outpatient Prescriptions  Medication Sig Dispense Refill  . albuterol (PROVENTIL) (2.5 MG/3ML) 0.083% nebulizer solution Take 3 mLs (2.5 mg total) by nebulization every 2 (two) hours as needed for wheezing.  75 mL  12  . allopurinol (ZYLOPRIM) 100 MG tablet Take 100 mg by mouth daily.       Marland Kitchen  amiodarone (PACERONE) 100 MG tablet Take 100 mg by mouth daily.      Marland Kitchen atorvastatin (LIPITOR) 20 MG tablet Take 20 mg by mouth every evening.       . B Complex-C-Zn-Folic Acid (DIALYVITE/ZINC PO) Take by mouth 3 (three) times a week. Patient has dialysis on Tuesdays, Thursdays, and Saturdays      . folic acid (FOLVITE) 1 MG tablet Take 1 mg by mouth daily.        . insulin glargine (LANTUS) 100 UNIT/ML injection Inject 10 Units into the skin at bedtime.      . metoprolol tartrate (LOPRESSOR) 25 MG tablet Take 0.5 tablets (12.5 mg total) by mouth 2 (two) times daily.    . metroNIDAZOLE (FLAGYL) 500 MG tablet Take 1 tablet (500 mg total) by mouth every 8 (eight) hours.    . midodrine (PROAMATINE) 5 MG tablet Take 1 tablet (5 mg total) by mouth 3 (three) times daily before meals.    . pantoprazole (PROTONIX) 40 MG tablet Take 40 mg by mouth 2 (two) times daily.     Marland Kitchen RENVELA 800 MG tablet Take 800 mg by mouth 3 (three) times daily with meals.     . SENSIPAR 30 MG tablet Take 30 mg by mouth daily.    . traMADol (ULTRAM) 50 MG tablet Take by mouth every 8 (eight) hours as needed for moderate pain.    . traZODone (DESYREL) 50 MG tablet Take 1 tablet (50 mg total) by mouth at bedtime.    COUMADIN 2.5 MG DAILY AND 5 MG ON WED.  Family History  Problem Relation Age of Onset  . Diabetes Mother   . Diabetes Father   . Diabetes Sister   . Colon cancer Neg Hx   . Colon polyps Neg Hx     Review of Systems PER HPI OTHERWISE ALL SYSTEMS ARE NEGATIVE.     Objective:   Physical Exam  Vitals reviewed. Constitutional: He is oriented to person, place, and time. He appears well-developed and well-nourished. No distress.  HENT:  Head: Normocephalic and atraumatic.  Mouth/Throat: Oropharynx is clear and moist. No oropharyngeal exudate.  Eyes: Pupils are equal, round, and reactive to light. No scleral icterus.  Neck: Normal range of motion. Neck supple.  Cardiovascular: Normal rate.   MID SYSTOLIC  CLICK, IRREGULAR RHYTHM  Pulmonary/Chest: Effort normal and breath sounds normal. No respiratory distress.  Abdominal: Soft. Bowel sounds are normal. He exhibits no distension. There is no tenderness.  Musculoskeletal: He exhibits no edema.  R BKA  Lymphadenopathy:    He has no cervical adenopathy.  Neurological: He is alert and oriented to person, place, and time.  Psychiatric:  FLAT AFFECT, NL MOOD           Assessment & Plan:

## 2014-04-10 NOTE — OR Nursing (Signed)
Glucose 64.  Dr Oneida Alar notified and order obtained for D5LR.  Fluids hung and infusing at 100 ml/hr.  Pt currently asymptomatic.

## 2014-04-10 NOTE — Telephone Encounter (Signed)
Dr Oneida Alar called stating did not want pt to restart coumadin until August 22,2015 as biopsies were done.  This nurse called the pt and instructed that he should not restart coumadin until August 22 and also changed his appt for clinic visit to August 28th  and pt states understanding

## 2014-04-10 NOTE — Op Note (Signed)
[facility] [address] [place], [address]   COLONOSCOPY PROCEDURE REPORT  PATIENT: [name], [name]  MR#: [id number] BIRTHDATE: [birth year] , 63  yrs. old GENDER: Male ENDOSCOPIST: Barney Drain, MD REFERRED [id number] Gerarda Fraction, M.D.  Rozann Lesches, M.D. PROCEDURE DATE:  04/10/2014 PROCEDURE:   Colonoscopy with biopsy, snare polypectomy, and cold biopsy polypectomy INDICATIONS:Rectal Bleeding  JUL 2015 WITH  INR 7.9. LAST TCS 2012: TV A IN LEFT COLON, ESRD ON COUMADIN MEDICATIONS: Fentanyl 25 mcg IV and Versed 4 mg IV  DESCRIPTION OF PROCEDURE:    Physical exam was performed.  Informed consent was obtained from the patient after explaining the benefits, risks, and alternatives to procedure.  The patient was connected to monitor and placed in left lateral position. Continuous oxygen was provided by nasal cannula and IV medicine administered through an indwelling cannula.  After administration of sedation and rectal exam, the patients rectum was intubated and the EC-[id number] ([id number])  colonoscope was advanced under direct visualization to the ileum.  The scope was removed slowly by carefully examining the color, texture, anatomy, and integrity mucosa on the way out.  The patient was recovered in endoscopy and discharged home in satisfactory condition.    COLON FINDINGS: A medium sized ulcer was found at the cecum. Multiple biopsies were performed using cold forceps.  , Two sessile polyps measuring 4-6 mm in size were found in the distal transverse colon and descending colon.  A polypectomy was performed using snare cautery. A single polyp measuring 3 mm in size was found in the distal transverse colon.  A polypectomy was performed with cold forceps.  Moderate diverticulosis noted in the ascending colon, descending colon, and sigmoid colon with associated tortuosity and muscular hypertrophy.  Small internal hemorrhoids were found.  The mucosa appeared normal in the  terminal ileum.    PREP QUALITY: good. CECAL W/D TIME: 26 minutes     COMPLICATIONS: None  ENDOSCOPIC IMPRESSION: 1.   RECTAL BLEEDING DUE TO ULCER IN RIGHT COLON AND/OR DIVERTICULOSIS 2.   3 COLON POLYPS REMOVED 3.   Moderate diverticulosis in the ascending colon, descending colon, and sigmoid colon 4.   Small internal hemorrhoids  RECOMMENDATIONS: NO COUMADIN OR HEPARIN WITH DIALYSIS FOR 7 DAYS. DISCUSSED WITH MARY(DA[facility]) & SHARON([facility]) FOLLOW A HIGH FIBER DIET.  AVOID ITEMS THAT CAUSE BLOATING AND GAS.  BIOPSY RESULTS SHOULD BE BACK IN 7 DAYS. Next colonoscopy in 1-3 years.    _______________________________eSigned:  Barney Drain, MD 04/10/2014 1:17 PM

## 2014-04-14 ENCOUNTER — Telehealth: Payer: Self-pay | Admitting: *Deleted

## 2014-04-14 ENCOUNTER — Encounter (HOSPITAL_COMMUNITY): Payer: Self-pay | Admitting: Gastroenterology

## 2014-04-14 NOTE — Telephone Encounter (Signed)
Message copied by Margretta Sidle on Tue Apr 14, 2014 11:05 AM ------      Message from: Satira Sark      Created: Tue Apr 14, 2014  9:01 AM       Please coordinate this with Lattie Haw in the Coumadin clinic. Thanks.            ----- Message -----         From: Margretta Sidle, RN         Sent: 04/14/2014   8:28 AM           To: Satira Sark, MD            Dr Domenic Polite      Dr Oneida Alar called on Friday August 14th and wants him to be off coumadin until August 22nd restart that day. Just wanted to let you know       Plan to have him take extra 1/2 tablet for 2 days when restarts       Wanted to let you know if have any changes      Thanks       Elbert Ewings Rn       ------

## 2014-04-14 NOTE — Telephone Encounter (Signed)
Message copied by Margretta Sidle on Tue Apr 14, 2014  9:32 AM ------      Message from: Satira Sark      Created: Tue Apr 14, 2014  9:01 AM       Please coordinate this with Lattie Haw in the Coumadin clinic. Thanks.            ----- Message -----         From: Margretta Sidle, RN         Sent: 04/14/2014   8:28 AM           To: Satira Sark, MD            Dr Domenic Polite      Dr Oneida Alar called on Friday August 14th and wants him to be off coumadin until August 22nd restart that day. Just wanted to let you know       Plan to have him take extra 1/2 tablet for 2 days when restarts       Wanted to let you know if have any changes      Thanks       Elbert Ewings Rn       ------

## 2014-04-14 NOTE — Telephone Encounter (Signed)
Spoke with Dr Alferd Apa Pharmacist and instructed to have pt take extra 1/2 tablet for 2 days when he restarts coumadin on August 22,2015

## 2014-04-14 NOTE — Telephone Encounter (Signed)
Spoke with Dr Alferd Apa Pharmacist and he advised to have pt take extra 1/2 tablet for 2 days when he restarts on August 22nd 2015  This nurse called pt and instructed to take extra 1/2 tablet for 2 days when he restarts on August 22 and reminded of appt to have INR rechecked on August 28th and  he states understanding

## 2014-04-18 ENCOUNTER — Telehealth: Payer: Self-pay | Admitting: Gastroenterology

## 2014-04-18 NOTE — Telephone Encounter (Signed)
Please call pt. HE had simple adenomas removed from HIS colon. HIS ULCERS IS MOST LIKELY DUE TO LOW BLOOD SUPPLY TO THAT AREA.   HE WILL NEED TO DISCUSS THE BENEFITS V. RISK OF COUMADIN WITH DR. MCDOWELL.   DR. Shuronda Santino WILL REVIEW HIS CT FROM JUL 2015 WITH RADIOLOGY & DR. Arnoldo Morale. IF THIS ARE DOES NOT HEAL HE IS AT RISK FOR PERFORATION. HE MAY NEED A SPECIAL CT EXAM TO EVALUATE THE BLOOD FLOW IN HIS ABDOMEN.   FOLLOW A HIGH FIBER DIET. AVOID ITEMS THAT CAUSE BLOATING AND GAS.  OPV IN SEP 2015 E30 GI BLEED/ISCHEMIC COLON ULCER.  Next colonoscopy in 3 years.

## 2014-04-20 ENCOUNTER — Encounter: Payer: Self-pay | Admitting: Gastroenterology

## 2014-04-20 ENCOUNTER — Telehealth: Payer: Self-pay | Admitting: Cardiology

## 2014-04-20 MED ORDER — ATORVASTATIN CALCIUM 20 MG PO TABS: 20.0000 mg | ORAL_TABLET | Freq: Every evening | ORAL | Status: DC

## 2014-04-20 NOTE — Telephone Encounter (Signed)
Patient aware of appointment on 06/10/14.  Appointment letter is mailed.

## 2014-04-20 NOTE — Telephone Encounter (Signed)
Patient needs Atorvastatin called in to [facility] in [place] / tgs

## 2014-04-20 NOTE — Telephone Encounter (Signed)
Called and informed pt.  

## 2014-04-21 NOTE — Telephone Encounter (Signed)
Reminder in epic and pt is aware of OV on 10/14 at 3 with SF

## 2014-04-24 ENCOUNTER — Ambulatory Visit (INDEPENDENT_AMBULATORY_CARE_PROVIDER_SITE_OTHER): Payer: Medicare Other | Admitting: *Deleted

## 2014-04-24 DIAGNOSIS — Z954 Presence of other heart-valve replacement: Secondary | ICD-10-CM

## 2014-04-24 DIAGNOSIS — Z7901 Long term (current) use of anticoagulants: Secondary | ICD-10-CM

## 2014-04-24 DIAGNOSIS — I4891 Unspecified atrial fibrillation: Secondary | ICD-10-CM

## 2014-04-24 DIAGNOSIS — Z952 Presence of prosthetic heart valve: Secondary | ICD-10-CM

## 2014-04-24 LAB — POCT INR: INR: 2.6

## 2014-04-27 ENCOUNTER — Ambulatory Visit (INDEPENDENT_AMBULATORY_CARE_PROVIDER_SITE_OTHER): Payer: Medicare Other | Admitting: Cardiology

## 2014-04-27 ENCOUNTER — Encounter: Payer: Self-pay | Admitting: Cardiology

## 2014-04-27 ENCOUNTER — Encounter: Payer: Self-pay | Admitting: Gastroenterology

## 2014-04-27 VITALS — BP 119/72 | HR 96 | Ht 76.0 in | Wt 192.0 lb

## 2014-04-27 DIAGNOSIS — N186 End stage renal disease: Secondary | ICD-10-CM

## 2014-04-27 DIAGNOSIS — I4891 Unspecified atrial fibrillation: Secondary | ICD-10-CM

## 2014-04-27 DIAGNOSIS — I429 Cardiomyopathy, unspecified: Secondary | ICD-10-CM

## 2014-04-27 DIAGNOSIS — I2510 Atherosclerotic heart disease of native coronary artery without angina pectoris: Secondary | ICD-10-CM

## 2014-04-27 DIAGNOSIS — Z79899 Other long term (current) drug therapy: Secondary | ICD-10-CM

## 2014-04-27 DIAGNOSIS — Z992 Dependence on renal dialysis: Secondary | ICD-10-CM

## 2014-04-27 NOTE — Assessment & Plan Note (Signed)
LVEF approximately 20% and stable compared to prior testing. We have discussed possibility of ICD, however decision was to hold off on this, particularly with increased risk of infection with ESRD on hemodialysis and other comorbidities.

## 2014-04-27 NOTE — Assessment & Plan Note (Signed)
Paroxysmal, continues on amiodarone and Coumadin. Followup TSH and LFTs for next visit.

## 2014-04-27 NOTE — Patient Instructions (Addendum)
Your physician recommends that you schedule a follow-up appointment in: 4 months. Your physician recommends that you continue on your current medications as directed. Please refer to the Current Medication list given to you today. Your physician recommends that you have lab work in 4 months just before your next visit in December 2015 to check your TSH & LFT's.

## 2014-04-27 NOTE — Assessment & Plan Note (Signed)
Keep followup with Dr. Lowanda Foster.

## 2014-04-27 NOTE — Assessment & Plan Note (Signed)
Bioprosthetic mitral valve replacement with trivial mitral regurgitation and grossly normal function by echocardiogram earlier this year.

## 2014-04-27 NOTE — Progress Notes (Signed)
Clinical Summary [name] is a medically complex 63 y.o.male last seen in April. He presents with his wife today. States that he has done reasonably well, has had recent GI procedures as documented in the chart. Was off Coumadin temporarily. He is now followed in our Coumadin clinic.  Followup echocardiogram in April revealed mildly dilated LV with mild LVH, LVEF 01-75%, grade 3 diastolic dysfunction, minimal aortic stenosis with mean gradient 7 mm mercury, grossly normal bioprosthetic mitral valve function with trivial mitral regurgitation, severe left atrial enlargement, moderately reduced RV contraction. I did talk with him about the possibility of a defibrillator, however his risk for infection would be increased particularly with ESRD, and decision was made to hold off on this.  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 Servando Snare in May of 2008. He has had paroxysmal atrial fibrillation, continues on Coumadin and amiodarone.  Last TSH 0.41 in 2014, LFTs normal at that time.   Allergies  Allergen Reactions  . Bacitracin Other (See Comments)    unknown  . Norvasc [Amlodipine Besylate] Other (See Comments)    unknown    Current Outpatient Prescriptions  Medication Sig Dispense Refill  . albuterol (PROVENTIL) (2.5 MG/3ML) 0.083% nebulizer solution Take 3 mLs (2.5 mg total) by nebulization every 2 (two) hours as needed for wheezing.  75 mL  12  . allopurinol (ZYLOPRIM) 100 MG tablet Take 100 mg by mouth daily.       Marland Kitchen amiodarone (PACERONE) 100 MG tablet Take 100 mg by mouth daily.      Marland Kitchen atorvastatin (LIPITOR) 20 MG tablet Take 1 tablet (20 mg total) by mouth every evening.  30 tablet  3  . B Complex-C-Zn-Folic Acid (DIALYVITE/ZINC PO) Take by mouth 3 (three) times a week. Patient has dialysis on  Tuesdays, Thursdays, and Saturdays      . folic acid (FOLVITE) 1 MG tablet Take 1 mg by mouth daily.        . insulin glargine (LANTUS) 100 UNIT/ML injection Inject 10 Units into the skin at bedtime.      . metoprolol tartrate (LOPRESSOR) 25 MG tablet Take 0.5 tablets (12.5 mg total) by mouth 2 (two) times daily.  30 tablet  0  . pantoprazole (PROTONIX) 40 MG tablet Take 40 mg by mouth 2 (two) times daily.       Marland Kitchen RENVELA 800 MG tablet Take 800 mg by mouth 3 (three) times daily with meals.       . SENSIPAR 30 MG tablet Take 30 mg by mouth daily.      . traMADol (ULTRAM) 50 MG tablet Take by mouth every 8 (eight) hours as needed for moderate pain.      . traZODone (DESYREL) 50 MG tablet Take 1 tablet (50 mg total) by mouth at bedtime.  30 tablet  0  . midodrine (PROAMATINE) 5 MG tablet Take 1 tablet (5 mg total) by mouth 3 (three) times daily before meals.  60 tablet  0  . warfarin (COUMADIN) 2.5 MG tablet Take 1 tablet by mouth as directed.       No current facility-administered medications for this visit.    Past Medical History  Diagnosis Date  . UGI bleed 02/12/11    On anticoagulation; EGD w/snare polypectomy-multiple polypoid lesions antrum(benign), Chronic active gastritis (NEGATIVE H pylori)  .  Anemia, chronic disease   . Sepsis due to enterococcus 02/11/11  . Atrial fibrillation     On coumadin  . Coronary atherosclerosis of native coronary artery     Multivessel status post CABG  . Cardiomyopathy     LVEF 40-45% 01/2011  . Type 2 diabetes mellitus   . Essential hypertension, benign   . Hyperlipemia   . Gout   . S/P patent foramen ovale closure   . ESRD on hemodialysis     Started dialysis sometime around 2008-2009.  Gets HD in [place], [place] with Dr Hinda Lenis on a TTS schedule.  Runs 3.5 hours with dry wt 86.5 kg as of June 2015.  Had a L arm AVF which didn't work and has been using his R forearm AVF for several years.    . Hyperbilirubinemia 08/10/2013  . SBO (small bowel  obstruction) 01/2014  . 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 Clostridium difficile colitis 01/2014  . Diverticulosis   . Internal hemorrhoids     Past Surgical History  Procedure Laterality Date  . Mitral valve replacement  01/05/2007    Bioprosthetic 40mm Edwards precordial tissue (Dr. Servando Snare)  . Coronary artery bypass graft  01/05/2007    LIMA-LAD, SVG-OM, seq. SVG-PDA, PLA-RCA  . Colonoscopy  06/23/2011    Dr. Oneida Alar: multiple sessile and pedunculated polyps, moderate diverticulosis, internal hemorrhoids, +ADENOMATOUS POLYPS, consider genetic testing, surveillance in October 2015   . Esophagogastroduodenoscopy  02/12/2011    CHRONIC ACTIVE GASTRITIS, NO H. pylori  . Transthoracic echocardiogram  11/2011    EF 25% w/ mod dilatation of LV & mod LVH; LA severely dilated; mod-severe dilation of RV with mod-severe decrease in RV function; mild MR & TR  . Cardioversion  04/19/2007    Dr. Marella Chimes  . Transthoracic echocardiogram  02/14/2011    EF 40-45%, mod conc LVH; ventricular septum with motion showing abnormal function, dyssynergy, paradox; mild AS; mild-mod MR stenosis; LA severely dilated; aptrial septum bowed from L to R with increased LA pressure   . Amputation Right 07/14/2013    Procedure: AMPUTATION DIGIT;  Surgeon: [name], MD;  Location: [facility];  Service: General;  Laterality: Right;  . Amputation Right 08/08/2013    Procedure: AMPUTATION BELOW KNEE;  Surgeon: [name], MD;  Location: [facility];  Service: General;  Laterality: Right;  . Stump revision Right 09/19/2013    Procedure: STUMP REVISION;  Surgeon: Scherry Ran, MD;  Location: [facility];  Service: General;  Laterality: Right;  . Amputation Right 10/17/2013    Procedure: AMPUTATION ABOVE KNEE;  Surgeon: [name], MD;  Location: [facility];  Service: General;  Laterality: Right;  . Colonoscopy N/A 04/10/2014    Procedure: COLONOSCOPY;  Surgeon: [name], MD;  Location: [facility];  Service:  Endoscopy;  Laterality: N/A;  67    Social History Mr. Candy reports that he quit smoking about 7 years ago. His smoking use included Cigarettes. He started smoking about 45 years ago. He has a 8 pack-year smoking history. He has never used smokeless tobacco. Mr. Fridman reports that he does not drink alcohol.  Review of Systems No chest pain or palpitations, no syncope. Reports tolerating hemodialysis. No bleeding problems recently. Other systems reviewed and negative except as outlined.  Physical Examination Filed Vitals:   04/27/14 1511  BP: 119/72  Pulse: 96   Filed Weights   04/27/14 1511  Weight: 192 lb (87.091 kg)    The  patient appears comfortable at rest, seated in wheelchair.  HEENT: Conjunctiva and lids normal, oropharynx clear.  Neck: Supple, no elevated JVP, no thyromegaly.  Lungs: Clear to auscultation, nonlabored breathing at rest.  Cardiac: Regular rate and rhythm, S4, soft systolic murmur, no pericardial rub.  Abdomen: Soft, nontender, bowel sounds present.  Extremities: No pitting edema, status post right AKA. Dialysis fistula in the right arm.  Skin: Warm and dry.  Musculoskeletal: No kyphosis.  Neuropsychiatric: Alert and oriented x3, affect grossly appropriate.   Problem List and Plan   H/O mitral valve replacement Bioprosthetic mitral valve replacement with trivial mitral regurgitation and grossly normal function by echocardiogram earlier this year.  Atrial fibrillation Paroxysmal, continues on amiodarone and Coumadin. Followup TSH and LFTs for next visit.  Secondary cardiomyopathy LVEF approximately 20% and stable compared to prior testing. We have discussed possibility of ICD, however decision was to hold off on this, particularly with increased risk of infection with ESRD on hemodialysis and other comorbidities.  ESRD (end stage renal disease) on dialysis Keep followup with Dr. Lowanda Foster.    Satira Sark, M.D., F.A.C.C.

## 2014-05-07 ENCOUNTER — Encounter: Payer: Self-pay | Admitting: Gastroenterology

## 2014-06-10 ENCOUNTER — Encounter: Payer: Self-pay | Admitting: Gastroenterology

## 2014-06-10 ENCOUNTER — Telehealth: Payer: Self-pay | Admitting: Cardiology

## 2014-06-10 ENCOUNTER — Ambulatory Visit (INDEPENDENT_AMBULATORY_CARE_PROVIDER_SITE_OTHER): Payer: Medicare Other | Admitting: Gastroenterology

## 2014-06-10 VITALS — BP 106/66 | HR 97 | Temp 97.0°F | Ht 76.0 in | Wt 201.0 lb

## 2014-06-10 DIAGNOSIS — K6389 Other specified diseases of intestine: Secondary | ICD-10-CM

## 2014-06-10 DIAGNOSIS — I2510 Atherosclerotic heart disease of native coronary artery without angina pectoris: Secondary | ICD-10-CM

## 2014-06-10 NOTE — Patient Instructions (Addendum)
I SPOKE TO DR. MCDOWELL'S OFFICE. YOU CAN RE-START THE LIPITOR. THEY WILL CALL [facility] AND MAKE SURE YOU ARE GETTING IT.  FOLLOW UP WITH ME AS NEEDED.  NO ADDITIONAL ENDOSCOPY IS NEEDED AT THIS TIME.

## 2014-06-10 NOTE — Telephone Encounter (Signed)
Please call [facility] to verify they received refill on Lipitor on 8/24. / tgs

## 2014-06-10 NOTE — Assessment & Plan Note (Addendum)
I PERSONALLY REVIEWED CT WITH DR. Ardeen Garland TODAY-DIFFUSE ASCVD, NO SMA STENOSIS. MOST ;IKE;LY DUE TO SMALL VESSEL ACSCVD. CLINICALLY IMPROVED.  MODIFY RISK FACTORS. RE-START LIPITOR OPV PRN

## 2014-06-10 NOTE — Telephone Encounter (Signed)
[facility] confirmed lipitor was received pt has 2 refills left and picked up last refill 9/24. Lm for pt

## 2014-06-10 NOTE — Progress Notes (Signed)
cc'ed to fax

## 2014-06-10 NOTE — Progress Notes (Signed)
Subjective:    Patient ID: [name] [name], male    DOB: [birth year], 63 y.o.   MRN: [id number]  [name], MD  HPI TOLERATING HD: TUTHSAT. No pain now. WAS HAVING PAIN ON RLQ. BMS: 1-2X/DAY-SOFT. PT DENIES FEVER, CHILLS, HEMATOCHEZIA, nausea, vomiting, melena, diarrhea, CHEST PAIN, SHORTNESS OF BREATH,  CHANGE IN BOWEL IN HABITS, constipation, abdominal pain, problems swallowing, problems with sedation, OR heartburn or indigestion.  PT HAS NOT BEEN TAKING LIPITOR.  Past Medical History  Diagnosis Date  . UGI bleed 02/12/11    On anticoagulation; EGD w/snare polypectomy-multiple polypoid lesions antrum(benign), Chronic active gastritis (NEGATIVE H pylori)  . Anemia, chronic disease   . Sepsis due to enterococcus 02/11/11  . Atrial fibrillation     On coumadin  . Coronary atherosclerosis of native coronary artery     Multivessel status post CABG  . Cardiomyopathy     LVEF 40-45% 01/2011  . Type 2 diabetes mellitus   . Essential hypertension, benign   . Hyperlipemia   . Gout   . S/P patent foramen ovale closure   . ESRD on hemodialysis     Started dialysis sometime around 2008-2009.  Gets HD in [place], [place] with Dr Hinda Lenis on a TTS schedule.  Runs 3.5 hours with dry wt 86.5 kg as of June 2015.  Had a L arm AVF which didn't work and has been using his R forearm AVF for several years.    . Hyperbilirubinemia 08/10/2013  . SBO (small bowel obstruction) 01/2014  . 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 Clostridium difficile colitis 01/2014  . Diverticulosis   . Internal hemorrhoids    Past Surgical History  Procedure Laterality Date  . Mitral valve replacement  01/05/2007    Bioprosthetic 1mm Edwards precordial tissue (Dr. Servando Snare)  . Coronary artery bypass graft  01/05/2007    LIMA-LAD, SVG-OM, seq. SVG-PDA, PLA-RCA  . Colonoscopy  06/23/2011    Dr. Oneida Alar: multiple sessile and pedunculated polyps, moderate diverticulosis, internal hemorrhoids, +ADENOMATOUS POLYPS, consider genetic  testing, surveillance in October 2015   . Esophagogastroduodenoscopy  02/12/2011    CHRONIC ACTIVE GASTRITIS, NO H. pylori  . Transthoracic echocardiogram  11/2011    EF 25% w/ mod dilatation of LV & mod LVH; LA severely dilated; mod-severe dilation of RV with mod-severe decrease in RV function; mild MR & TR  . Cardioversion  04/19/2007    Dr. Marella Chimes  . Transthoracic echocardiogram  02/14/2011    EF 40-45%, mod conc LVH; ventricular septum with motion showing abnormal function, dyssynergy, paradox; mild AS; mild-mod MR stenosis; LA severely dilated; aptrial septum bowed from L to R with increased LA pressure   . Amputation Right 07/14/2013    Procedure: AMPUTATION DIGIT;  Surgeon: [name], MD;  Location: [facility];  Service: General;  Laterality: Right;  . Amputation Right 08/08/2013    Procedure: AMPUTATION BELOW KNEE;  Surgeon: [name], MD;  Location: [facility];  Service: General;  Laterality: Right;  . Stump revision Right 09/19/2013    Procedure: STUMP REVISION;  Surgeon: Scherry Ran, MD;  Location: [facility];  Service: General;  Laterality: Right;  . Amputation Right 10/17/2013    Procedure: AMPUTATION ABOVE KNEE;  Surgeon: [name], MD;  Location: [facility];  Service: General;  Laterality: Right;  . Colonoscopy N/A 04/10/2014    Procedure: COLONOSCOPY;  Surgeon: [name], MD;  [facility];  Service: Endoscopy;  Laterality: N/A;  830  Allergies  Allergen Reactions  . Bacitracin Other (See Comments)    unknown  . Norvasc [Amlodipine Besylate] Other (See Comments)    unknown   Current Outpatient Prescriptions  Medication Sig Dispense Refill  . albuterol (PROVENTIL) (2.5 MG/3ML) 0.083% nebulizer solution Take 3 mLs (2.5 mg total) by nebulization every 2 (two) hours as needed for wheezing.  75 mL  12  . allopurinol (ZYLOPRIM) 100 MG tablet Take 100 mg by mouth daily.       Marland Kitchen amiodarone (PACERONE) 100 MG tablet Take 100 mg by mouth daily.      . B  Complex-C-Zn-Folic Acid (DIALYVITE/ZINC PO) Take by mouth 3 (three) times a week. Patient has dialysis on Tuesdays, Thursdays, and Saturdays      . folic acid (FOLVITE) 1 MG tablet Take 1 mg by mouth daily.        . insulin glargine (LANTUS) 100 UNIT/ML injection Inject 10 Units into the skin at bedtime.    . metoprolol tartrate (LOPRESSOR) 25 MG tablet Take 0.5 tablets (12.5 mg total) by mouth 2 (two) times daily.    . midodrine (PROAMATINE) 5 MG tablet Take 1 tablet (5 mg total) by mouth 3 (three) times daily before meals.    . pantoprazole (PROTONIX) 40 MG tablet Take 40 mg by mouth 2 (two) times daily.     Marland Kitchen RENVELA 800 MG tablet Take 800 mg by mouth 3 (three) times daily with meals.     . SENSIPAR 30 MG tablet Take 30 mg by mouth daily.    . traMADol (ULTRAM) 50 MG tablet Take by mouth every 8 (eight) hours as needed for moderate pain.    . traZODone (DESYREL) 50 MG tablet Take 1 tablet (50 mg total) by mouth at bedtime.    Marland Kitchen warfarin (COUMADIN) 2.5 MG tablet Take 1 tablet by mouth as directed.      Marland Kitchen atorvastatin (LIPITOR) 20 MG tablet Take 1 tablet (20 mg total) by mouth every evening. NOT TAKING        Review of Systems     Objective:   Physical Exam  Vitals reviewed. Constitutional: He is oriented to person, place, and time. He appears well-developed and well-nourished. No distress.  HENT:  Head: Normocephalic and atraumatic.  Mouth/Throat: Oropharynx is clear and moist. No oropharyngeal exudate.  Eyes: Pupils are equal, round, and reactive to light. No scleral icterus.  Neck: Normal range of motion. Neck supple.  Cardiovascular: Normal rate.   Pulmonary/Chest: Effort normal. No respiratory distress.  DECREASED BREATH SOUNDS IN R BASE  Abdominal: Soft. Bowel sounds are normal. He exhibits no distension. There is no tenderness.  Lymphadenopathy:    He has no cervical adenopathy.  Neurological: He is alert and oriented to person, place, and time.  NO FOCAL DEFICITS     Psychiatric: He has a normal mood and affect.          Assessment & Plan:

## 2014-07-28 ENCOUNTER — Encounter (HOSPITAL_COMMUNITY): Payer: Self-pay | Admitting: Emergency Medicine

## 2014-07-28 ENCOUNTER — Emergency Department (HOSPITAL_COMMUNITY)
Admission: EM | Admit: 2014-07-28 | Discharge: 2014-07-28 | Disposition: A | Discharge status: Home / Self Care | Payer: Medicare Other | Attending: Emergency Medicine | Admitting: Emergency Medicine

## 2014-07-28 ENCOUNTER — Emergency Department (HOSPITAL_COMMUNITY): Payer: Medicare Other

## 2014-07-28 DIAGNOSIS — Y9389 Activity, other specified: Secondary | ICD-10-CM | POA: Diagnosis not present

## 2014-07-28 DIAGNOSIS — Z794 Long term (current) use of insulin: Secondary | ICD-10-CM | POA: Insufficient documentation

## 2014-07-28 DIAGNOSIS — E785 Hyperlipidemia, unspecified: Secondary | ICD-10-CM | POA: Diagnosis not present

## 2014-07-28 DIAGNOSIS — Y9289 Other specified places as the place of occurrence of the external cause: Secondary | ICD-10-CM | POA: Insufficient documentation

## 2014-07-28 DIAGNOSIS — D649 Anemia, unspecified: Secondary | ICD-10-CM | POA: Insufficient documentation

## 2014-07-28 DIAGNOSIS — Z992 Dependence on renal dialysis: Secondary | ICD-10-CM | POA: Diagnosis not present

## 2014-07-28 DIAGNOSIS — M109 Gout, unspecified: Secondary | ICD-10-CM | POA: Insufficient documentation

## 2014-07-28 DIAGNOSIS — Z952 Presence of prosthetic heart valve: Secondary | ICD-10-CM | POA: Insufficient documentation

## 2014-07-28 DIAGNOSIS — Z8719 Personal history of other diseases of the digestive system: Secondary | ICD-10-CM | POA: Insufficient documentation

## 2014-07-28 DIAGNOSIS — S82842A Displaced bimalleolar fracture of left lower leg, initial encounter for closed fracture: Secondary | ICD-10-CM

## 2014-07-28 DIAGNOSIS — Z87891 Personal history of nicotine dependence: Secondary | ICD-10-CM | POA: Insufficient documentation

## 2014-07-28 DIAGNOSIS — I2510 Atherosclerotic heart disease of native coronary artery without angina pectoris: Secondary | ICD-10-CM | POA: Insufficient documentation

## 2014-07-28 DIAGNOSIS — Y998 Other external cause status: Secondary | ICD-10-CM | POA: Insufficient documentation

## 2014-07-28 DIAGNOSIS — S8992XA Unspecified injury of left lower leg, initial encounter: Secondary | ICD-10-CM | POA: Diagnosis present

## 2014-07-28 DIAGNOSIS — S82831A Other fracture of upper and lower end of right fibula, initial encounter for closed fracture: Secondary | ICD-10-CM | POA: Insufficient documentation

## 2014-07-28 DIAGNOSIS — I4891 Unspecified atrial fibrillation: Secondary | ICD-10-CM | POA: Insufficient documentation

## 2014-07-28 DIAGNOSIS — Z89611 Acquired absence of right leg above knee: Secondary | ICD-10-CM | POA: Diagnosis not present

## 2014-07-28 DIAGNOSIS — N186 End stage renal disease: Secondary | ICD-10-CM | POA: Insufficient documentation

## 2014-07-28 DIAGNOSIS — Z79899 Other long term (current) drug therapy: Secondary | ICD-10-CM | POA: Insufficient documentation

## 2014-07-28 DIAGNOSIS — Z8619 Personal history of other infectious and parasitic diseases: Secondary | ICD-10-CM | POA: Diagnosis not present

## 2014-07-28 DIAGNOSIS — Z7901 Long term (current) use of anticoagulants: Secondary | ICD-10-CM | POA: Diagnosis not present

## 2014-07-28 DIAGNOSIS — S39012A Strain of muscle, fascia and tendon of lower back, initial encounter: Secondary | ICD-10-CM | POA: Diagnosis not present

## 2014-07-28 DIAGNOSIS — Z951 Presence of aortocoronary bypass graft: Secondary | ICD-10-CM | POA: Insufficient documentation

## 2014-07-28 DIAGNOSIS — I120 Hypertensive chronic kidney disease with stage 5 chronic kidney disease or end stage renal disease: Secondary | ICD-10-CM | POA: Diagnosis not present

## 2014-07-28 DIAGNOSIS — E119 Type 2 diabetes mellitus without complications: Secondary | ICD-10-CM | POA: Diagnosis not present

## 2014-07-28 DIAGNOSIS — W1839XA Other fall on same level, initial encounter: Secondary | ICD-10-CM | POA: Insufficient documentation

## 2014-07-28 MED ORDER — HYDROCODONE-ACETAMINOPHEN 5-325 MG PO TABS: 1.0000 | ORAL_TABLET | Freq: Four times a day (QID) | ORAL | Status: DC | PRN

## 2014-07-28 NOTE — ED Provider Notes (Signed)
CSN: [id number]     Arrival date & time 07/28/14  1137 History   First MD Initiated Contact with Patient 07/28/14 1151     Chief Complaint  Patient presents with  . Back Pain     (Consider location/radiation/quality/duration/timing/severity/associated sxs/prior Treatment) HPI Comments: Pt comes in today with continued left ankle pain and left lower back pain after a fall 1 week ago. Denies loc with fall. Pt does take coumadin. Pt states that he prosthetic limb gave wall and he fell. States that he has tried otc medication without relief. Denies numbness, weakness or incontinence. No previous ankle  The history is provided by the patient. No language interpreter was used.    Past Medical History  Diagnosis Date  . UGI bleed 02/12/11    On anticoagulation; EGD w/snare polypectomy-multiple polypoid lesions antrum(benign), Chronic active gastritis (NEGATIVE H pylori)  . Anemia, chronic disease   . Sepsis due to enterococcus 02/11/11  . Atrial fibrillation     On coumadin  . Coronary atherosclerosis of native coronary artery     Multivessel status post CABG  . Cardiomyopathy     LVEF 40-45% 01/2011  . Type 2 diabetes mellitus   . Essential hypertension, benign   . Hyperlipemia   . Gout   . S/P patent foramen ovale closure   . ESRD on hemodialysis     Started dialysis sometime around 2008-2009.  Gets HD in [place], [place] with Dr Hinda Lenis on a TTS schedule.  Runs 3.5 hours with dry wt 86.5 kg as of June 2015.  Had a L arm AVF which didn't work and has been using his R forearm AVF for several years.    . Hyperbilirubinemia 08/10/2013  . SBO (small bowel obstruction) 01/2014  . 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 Clostridium difficile colitis 01/2014  . Diverticulosis   . Internal hemorrhoids    Past Surgical History  Procedure Laterality Date  . Mitral valve replacement  01/05/2007    Bioprosthetic 80mm Edwards precordial tissue (Dr. Servando Snare)  . Coronary artery bypass graft  01/05/2007   LIMA-LAD, SVG-OM, seq. SVG-PDA, PLA-RCA  . Colonoscopy  06/23/2011    Dr. Oneida Alar: multiple sessile and pedunculated polyps, moderate diverticulosis, internal hemorrhoids, +ADENOMATOUS POLYPS, consider genetic testing, surveillance in October 2015   . Esophagogastroduodenoscopy  02/12/2011    CHRONIC ACTIVE GASTRITIS, NO H. pylori  . Transthoracic echocardiogram  11/2011    EF 25% w/ mod dilatation of LV & mod LVH; LA severely dilated; mod-severe dilation of RV with mod-severe decrease in RV function; mild MR & TR  . Cardioversion  04/19/2007    Dr. Marella Chimes  . Transthoracic echocardiogram  02/14/2011    EF 40-45%, mod conc LVH; ventricular septum with motion showing abnormal function, dyssynergy, paradox; mild AS; mild-mod MR stenosis; LA severely dilated; aptrial septum bowed from L to R with increased LA pressure   . Amputation Right 07/14/2013    Procedure: AMPUTATION DIGIT;  Surgeon: [name], MD;  Location: [facility];  Service: General;  Laterality: Right;  . Amputation Right 08/08/2013    Procedure: AMPUTATION BELOW KNEE;  Surgeon: [name], MD;  Location: [facility];  Service: General;  Laterality: Right;  . Stump revision Right 09/19/2013    Procedure: STUMP REVISION;  Surgeon: Scherry Ran, MD;  Location: [facility];  Service: General;  Laterality: Right;  . Amputation Right 10/17/2013    Procedure: AMPUTATION ABOVE KNEE;  Surgeon: [name], MD;  Location:  [facility];  Service: General;  Laterality: Right;  . Colonoscopy N/A 04/10/2014    Procedure: COLONOSCOPY;  Surgeon: [name], MD;  [facility];  Service: Endoscopy;  Laterality: N/A;  69   Family History  Problem Relation Age of Onset  . Diabetes Mother   . Diabetes Father   . Diabetes Sister   . Colon cancer Neg Hx   . Colon polyps Neg Hx    History  Substance Use Topics  . Smoking status: Former Smoker -- 1.00 packs/day for 8 years    Types: Cigarettes    Start date: 04/27/1969    Quit date:  11/27/2006  . Smokeless tobacco: Never Used     Comment: quit about 5 yrs  . Alcohol Use: No    Review of Systems  All other systems reviewed and are negative.     Allergies  Bacitracin and Norvasc  Home Medications   Prior to Admission medications   Medication Sig Start Date End Date Taking? Authorizing Provider  albuterol (PROVENTIL) (2.5 MG/3ML) 0.083% nebulizer solution Take 3 mLs (2.5 mg total) by nebulization every 2 (two) hours as needed for wheezing. 02/21/14   Kinnie Feil, MD  allopurinol (ZYLOPRIM) 100 MG tablet Take 100 mg by mouth daily.  05/15/11   Historical Provider, MD  amiodarone (PACERONE) 100 MG tablet Take 100 mg by mouth daily.    Historical Provider, MD  atorvastatin (LIPITOR) 20 MG tablet Take 1 tablet (20 mg total) by mouth every evening. 04/20/14   Satira Sark, MD  B Complex-C-Zn-Folic Acid (DIALYVITE/ZINC PO) Take by mouth 3 (three) times a week. Patient has dialysis on Tuesdays, Thursdays, and Saturdays    Historical Provider, MD  folic acid (FOLVITE) 1 MG tablet Take 1 mg by mouth daily.      Historical Provider, MD  insulin glargine (LANTUS) 100 UNIT/ML injection Inject 10 Units into the skin at bedtime.    Historical Provider, MD  metoprolol tartrate (LOPRESSOR) 25 MG tablet Take 0.5 tablets (12.5 mg total) by mouth 2 (two) times daily. 03/03/14   Allie Bossier, MD  midodrine (PROAMATINE) 5 MG tablet Take 1 tablet (5 mg total) by mouth 3 (three) times daily before meals. 02/21/14   Kinnie Feil, MD  pantoprazole (PROTONIX) 40 MG tablet Take 40 mg by mouth 2 (two) times daily.  05/25/11   Historical Provider, MD  RENVELA 800 MG tablet Take 800 mg by mouth 3 (three) times daily with meals.  05/03/11   Historical Provider, MD  SENSIPAR 30 MG tablet Take 30 mg by mouth daily. 01/22/14   Historical Provider, MD  traMADol (ULTRAM) 50 MG tablet Take by mouth every 8 (eight) hours as needed for moderate pain.    Historical Provider, MD  traZODone (DESYREL)  50 MG tablet Take 1 tablet (50 mg total) by mouth at bedtime. 08/13/13   Nita Sells, MD  warfarin (COUMADIN) 2.5 MG tablet Take 1 tablet by mouth as directed. 03/30/14   Historical Provider, MD   BP 127/62 mmHg  Pulse 80  Temp(Src) 98.7 F (37.1 C) (Oral)  Resp 18  Ht 6\' 4"  (1.93 m)  Wt 190 lb (86.183 kg)  BMI 23.14 kg/m2  SpO2 100% Physical Exam  Constitutional: He is oriented to person, place, and time. He appears well-developed and well-nourished.  Cardiovascular: Normal rate and regular rhythm.   Pulmonary/Chest: Effort normal and breath sounds normal.  Musculoskeletal: Normal range of motion.  Cervical back: Normal.       Thoracic back: Normal.  Left lumbar paraspinal tenderness. Swelling noted to the left lateral ankle. Full rom.pulses intact. r aka  Neurological: He is alert and oriented to person, place, and time. He exhibits normal muscle tone. Coordination normal.  Skin: Skin is warm and dry.  Psychiatric: He has a normal mood and affect.  Nursing note and vitals reviewed.   ED Course  Procedures (including critical care time) Labs Review Labs Reviewed - No data to display  Imaging Review Dg Ankle Complete Left  07/28/2014   CLINICAL DATA:  Left ankle pain secondary to a fall 1 week ago.  EXAM: LEFT ANKLE COMPLETE - 3+ VIEW  COMPARISON:  None.  FINDINGS: There is a slightly displaced spiral fracture of the distal fibula. There are slight arthritic spurs on the distal tibia. There is extensive arterial vascular calcification around the ankle. Small ankle joint effusion.  IMPRESSION: Minimally displaced spiral fracture of the distal fibula.   Electronically Signed   By: Rozetta Nunnery M.D.   On: 07/28/2014 13:08     EKG Interpretation None      MDM   Final diagnoses:  Pott's fracture (of distal fibula), left, closed, initial encounter  Lumbar strain, initial encounter    pts ankle splinted. Pt has wheelchair and walker at home but requesting crutches.  Discussed risk of head injury being on coumadin. Lumbar paraspinal tenderness no imaging done. Pt is neurologically intact    Glendell Docker, NP 07/28/14 1331  Glendell Docker, NP 07/28/14 [place], MD 07/28/14 [phone]

## 2014-07-28 NOTE — ED Notes (Signed)
Pt reports fell when his prosthetic limb gave way x1 week ago. Pt reports lower back pain and left ankle pain ever since. Pt reports is on coumadin. nad noted.

## 2014-07-28 NOTE — Discharge Instructions (Signed)
Ankle Fracture  A fracture is a break in a bone. The ankle joint is made up of three bones. These include the lower (distal)sections of your lower leg bones, called the tibia and fibula, along with a bone in your foot, called the talus. Depending on how bad the break is and if more than one ankle joint bone is broken, a cast or splint is used to protect and keep your injured bone from moving while it heals. Sometimes, surgery is required to help the fracture heal properly.   There are two general types of fractures:   Stable fracture. This includes a single fracture line through one bone, with no injury to ankle ligaments. A fracture of the talus that does not have any displacement (movement of the bone on either side of the fracture line) is also stable.   Unstable fracture. This includes more than one fracture line through one or more bones in the ankle joint. It also includes fractures that have displacement of the bone on either side of the fracture line.  CAUSES   A direct blow to the ankle.    Quickly and severely twisting your ankle.   Trauma, such as a car accident or falling from a significant height.  RISK FACTORS  You may be at a higher risk of ankle fracture if:   You have certain medical conditions.   You are involved in high-impact sports.   You are involved in a high-impact car accident.  SIGNS AND SYMPTOMS    Tender and swollen ankle.   Bruising around the injured ankle.   Pain on movement of the ankle.   Difficulty walking or putting weight on the ankle.   A cold foot below the site of the ankle injury. This can occur if the blood vessels passing through your injured ankle were also damaged.   Numbness in the foot below the site of the ankle injury.  DIAGNOSIS   An ankle fracture is usually diagnosed with a physical exam and X-rays. A CT scan may also be required for complex fractures.  TREATMENT   Stable fractures are treated with a cast or splint and using crutches to avoid putting  weight on your injured ankle. This is followed by an ankle strengthening program. Some patients require a special type of cast, depending on other medical problems they may have. Unstable fractures require surgery to ensure the bones heal properly. Your health care provider will tell you what type of fracture you have and the best treatment for your condition.  HOME CARE INSTRUCTIONS    Review correct crutch use with your health care provider and use your crutches as directed. Safe use of crutches is extremely important. Misuse of crutches can cause you to fall or cause injury to nerves in your hands or armpits.   Do not put weight or pressure on the injured ankle until directed by your health care provider.   To lessen the swelling, keep the injured leg elevated while sitting or lying down.   Apply ice to the injured area:   Put ice in a plastic bag.   Place a towel between your cast and the bag.   Leave the ice on for 20 minutes, 2-3 times a day.   If you have a plaster or fiberglass cast:   Do not try to scratch the skin under the cast with any objects. This can increase your risk of skin infection.   Check the skin around the cast every day. You   may put lotion on any red or sore areas.   Keep your cast dry and clean.   If you have a plaster splint:   Wear the splint as directed.   You may loosen the elastic around the splint if your toes become numb, tingle, or turn cold or blue.   Do not put pressure on any part of your cast or splint; it may break. Rest your cast only on a pillow the first 24 hours until it is fully hardened.   Your cast or splint can be protected during bathing with a plastic bag sealed to your skin with medical tape. Do not lower the cast or splint into water.   Take medicines as directed by your health care provider. Only take over-the-counter or prescription medicines for pain, discomfort, or fever as directed by your health care provider.   Do not drive a vehicle until  your health care provider specifically tells you it is safe to do so.   If your health care provider has given you a follow-up appointment, it is very important to keep that appointment. Not keeping the appointment could result in a chronic or permanent injury, pain, and disability. If you have any problem keeping the appointment, call the facility for assistance.  SEEK MEDICAL CARE IF:  You develop increased swelling or discomfort.  SEEK IMMEDIATE MEDICAL CARE IF:    Your cast gets damaged or breaks.   You have continued severe pain.   You develop new pain or swelling after the cast was put on.   Your skin or toenails below the injury turn blue or gray.   Your skin or toenails below the injury feel cold, numb, or have loss of sensitivity to touch.   There is a bad smell or pus draining from under the cast.  MAKE SURE YOU:    Understand these instructions.   Will watch your condition.   Will get help right away if you are not doing well or get worse.  Document Released: 08/11/2000 Document Revised: 08/19/2013 Document Reviewed: 03/13/2013  [employer] Patient Information 2015 [employer]. This information is not intended to replace advice given to you by your health care provider. Make sure you discuss any questions you have with your health care provider.

## 2014-07-28 NOTE — ED Notes (Signed)
No bruises or contusions noted in triage.

## 2014-07-30 ENCOUNTER — Encounter: Payer: Self-pay | Admitting: Orthopedic Surgery

## 2014-07-30 ENCOUNTER — Ambulatory Visit (INDEPENDENT_AMBULATORY_CARE_PROVIDER_SITE_OTHER): Payer: Medicare Other | Admitting: Orthopedic Surgery

## 2014-07-30 VITALS — BP 83/42 | Ht 76.0 in | Wt 190.0 lb

## 2014-07-30 DIAGNOSIS — I2510 Atherosclerotic heart disease of native coronary artery without angina pectoris: Secondary | ICD-10-CM

## 2014-07-30 DIAGNOSIS — S82402A Unspecified fracture of shaft of left fibula, initial encounter for closed fracture: Secondary | ICD-10-CM

## 2014-07-30 MED ORDER — HYDROCODONE-ACETAMINOPHEN 5-325 MG PO TABS: 1.0000 | ORAL_TABLET | Freq: Four times a day (QID) | ORAL | Status: DC | PRN

## 2014-07-30 NOTE — Progress Notes (Signed)
Patient ID: [name] [name], male   DOB: May 17, 1951, 63 y.o.   MRN: [id number] Patient ID: [name] [name], male   DOB: [birth year], 63 y.o.   MRN: [id number]  Chief Complaint  Patient presents with  . Ankle Injury    left ankle fracture, doi 07/20/14    HPI [name] [name] is a 63 y.o. male.  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 He went to the ER he has a nondisplaced fibular fracture was placed sugar tong splint he has 7 out of 10 aching pain associated with swelling. He's been using his walker and his prosthesis.  Review of systems numbness tingling joint pain limb pain ankle swelling blood in his stool all other systems were normal  He is allergic to bacitracin and Norvasc  Past Medical History  Diagnosis Date  . UGI bleed 02/12/11    On anticoagulation; EGD w/snare polypectomy-multiple polypoid lesions antrum(benign), Chronic active gastritis (NEGATIVE H pylori)  . Anemia, chronic disease   . Sepsis due to enterococcus 02/11/11  . Atrial fibrillation     On coumadin  . Coronary atherosclerosis of native coronary artery     Multivessel status post CABG  . Cardiomyopathy     LVEF 40-45% 01/2011  . Type 2 diabetes mellitus   . Essential hypertension, benign   . Hyperlipemia   . Gout   . S/P patent foramen ovale closure   . ESRD on hemodialysis     Started dialysis sometime around 2008-2009.  Gets HD in [place], [place] with Dr Hinda Lenis on a TTS schedule.  Runs 3.5 hours with dry wt 86.5 kg as of June 2015.  Had a L arm AVF which didn't work and has been using his R forearm AVF for several years.    . Hyperbilirubinemia 08/10/2013  . SBO (small bowel obstruction) 01/2014  . 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 Clostridium difficile colitis 01/2014  . Diverticulosis   . Internal hemorrhoids     Past Surgical History  Procedure Laterality Date  . Mitral valve replacement  01/05/2007    Bioprosthetic 84mm Edwards precordial tissue (Dr.  Servando Snare)  . Coronary artery bypass graft  01/05/2007    LIMA-LAD, SVG-OM, seq. SVG-PDA, PLA-RCA  . Colonoscopy  06/23/2011    Dr. Oneida Alar: multiple sessile and pedunculated polyps, moderate diverticulosis, internal hemorrhoids, +ADENOMATOUS POLYPS, consider genetic testing, surveillance in October 2015   . Esophagogastroduodenoscopy  02/12/2011    CHRONIC ACTIVE GASTRITIS, NO H. pylori  . Transthoracic echocardiogram  11/2011    EF 25% w/ mod dilatation of LV & mod LVH; LA severely dilated; mod-severe dilation of RV with mod-severe decrease in RV function; mild MR & TR  . Cardioversion  04/19/2007    Dr. Marella Chimes  . Transthoracic echocardiogram  02/14/2011    EF 40-45%, mod conc LVH; ventricular septum with motion showing abnormal function, dyssynergy, paradox; mild AS; mild-mod MR stenosis; LA severely dilated; aptrial septum bowed from L to R with increased LA pressure   . Amputation Right 07/14/2013    Procedure: AMPUTATION DIGIT;  Surgeon: [name], MD;  Location: [facility];  Service: General;  Laterality: Right;  . Amputation Right 08/08/2013    Procedure: AMPUTATION BELOW KNEE;  Surgeon: [name], MD;  Location: [facility];  Service: General;  Laterality: Right;  . Stump revision Right 09/19/2013    Procedure: STUMP REVISION;  Surgeon: Scherry Ran,  MD;  Location: [facility];  Service: General;  Laterality: Right;  . Amputation Right 10/17/2013    Procedure: AMPUTATION ABOVE KNEE;  Surgeon: [name], MD;  Location: [facility];  Service: General;  Laterality: Right;  . Colonoscopy N/A 04/10/2014    Procedure: COLONOSCOPY;  Surgeon: [name], MD;  Location: [facility];  Service: Endoscopy;  Laterality: N/A;  31    Family History  Problem Relation Age of Onset  . Diabetes Mother   . Diabetes Father   . Diabetes Sister   . Colon cancer Neg Hx   . Colon polyps Neg Hx     Social History History  Substance Use Topics  . Smoking status: Former Smoker -- 1.00  packs/day for 8 years    Types: Cigarettes    Start date: 04/27/1969    Quit date: 11/27/2006  . Smokeless tobacco: Never Used     Comment: quit about 5 yrs  . Alcohol Use: No    Allergies  Allergen Reactions  . Bacitracin Other (See Comments)    unknown  . Norvasc [Amlodipine Besylate] Other (See Comments)    unknown    Current Outpatient Prescriptions  Medication Sig Dispense Refill  . albuterol (PROVENTIL) (2.5 MG/3ML) 0.083% nebulizer solution Take 3 mLs (2.5 mg total) by nebulization every 2 (two) hours as needed for wheezing. 75 mL 12  . allopurinol (ZYLOPRIM) 100 MG tablet Take 100 mg by mouth daily.     Marland Kitchen amiodarone (PACERONE) 200 MG tablet Take 200 mg by mouth 2 (two) times daily.     . B Complex-C-Zn-Folic Acid (DIALYVITE/ZINC PO) Take by mouth 3 (three) times a week. Patient has dialysis on Tuesdays, Thursdays, and Saturdays    . folic acid (FOLVITE) 1 MG tablet Take 1 mg by mouth daily.      Marland Kitchen HYDROcodone-acetaminophen (NORCO/VICODIN) 5-325 MG per tablet Take 1 tablet by mouth every 6 (six) hours as needed. 10 tablet 0  . insulin glargine (LANTUS) 100 UNIT/ML injection Inject 10 Units into the skin at bedtime.    . metoprolol (LOPRESSOR) 50 MG tablet Take 75 mg by mouth 2 (two) times daily.    . metoprolol tartrate (LOPRESSOR) 25 MG tablet Take 0.5 tablets (12.5 mg total) by mouth 2 (two) times daily. 30 tablet 0  . midodrine (PROAMATINE) 5 MG tablet Take 1 tablet (5 mg total) by mouth 3 (three) times daily before meals. 60 tablet 0  . pantoprazole (PROTONIX) 40 MG tablet Take 40 mg by mouth 2 (two) times daily.     Marland Kitchen RENVELA 800 MG tablet Take 800 mg by mouth 3 (three) times daily with meals.     . SENSIPAR 30 MG tablet Take 30 mg by mouth daily.    . traMADol (ULTRAM) 50 MG tablet Take by mouth every 8 (eight) hours as needed for moderate pain.    . traZODone (DESYREL) 50 MG tablet Take 1 tablet (50 mg total) by mouth at bedtime. 30 tablet 0  . warfarin (COUMADIN) 2.5  MG tablet Take 1.25 tablets by mouth daily.     Marland Kitchen atorvastatin (LIPITOR) 20 MG tablet Take 1 tablet (20 mg total) by mouth every evening. 30 tablet 3  . HYDROcodone-acetaminophen (NORCO/VICODIN) 5-325 MG per tablet Take 1 tablet by mouth every 6 (six) hours as needed for moderate pain. 28 tablet 0   No current facility-administered medications for this visit.    Review of Systems Review of Systems  Blood pressure 83/42, height 6\' 4"  (  1.93 m), weight 190 lb (86.183 kg).  Physical Exam Physical Exam Stable vital signs as above is oriented 3 his mood and affect is normal he has an above-the-knee amputation on the right his general appearance is normal he ambulated today with a wheelchair.  Very minimal swelling tenderness to palpation over the fibula ankle range of motion limited to 20 drawer test normal muscle tone normal without atrophy skin dark but intact his dorsalis pedis pulses not palpable A refill is good he has pressure sensation   Data Reviewed Independently interpreted the x-rays a nondisplaced fibular fracture with an intact ankle mortise although x-rays are poor for the mortise view overlap of the fibula and tibia are normal  Assessment    Encounter Diagnosis  Name Primary?  . Fibula fracture, left, closed, initial encounter Yes        Plan    Recommend weightbearing as tolerated in a Cam Walker with a one-week x-ray       Arther Abbott 07/30/2014, 3:17 PM

## 2014-07-30 NOTE — Patient Instructions (Signed)
Weightbearing as tolerated!

## 2014-08-06 ENCOUNTER — Encounter: Payer: Self-pay | Admitting: Orthopedic Surgery

## 2014-08-06 ENCOUNTER — Ambulatory Visit (INDEPENDENT_AMBULATORY_CARE_PROVIDER_SITE_OTHER): Payer: Self-pay | Admitting: Orthopedic Surgery

## 2014-08-06 ENCOUNTER — Ambulatory Visit (INDEPENDENT_AMBULATORY_CARE_PROVIDER_SITE_OTHER): Payer: Medicare Other

## 2014-08-06 VITALS — BP 188/131 | Ht 76.0 in | Wt 190.0 lb

## 2014-08-06 DIAGNOSIS — S82402D Unspecified fracture of shaft of left fibula, subsequent encounter for closed fracture with routine healing: Secondary | ICD-10-CM

## 2014-08-06 DIAGNOSIS — S82892A Other fracture of left lower leg, initial encounter for closed fracture: Secondary | ICD-10-CM

## 2014-08-06 MED ORDER — HYDROCODONE-ACETAMINOPHEN 5-325 MG PO TABS: 1.0000 | ORAL_TABLET | Freq: Four times a day (QID) | ORAL | Status: AC | PRN

## 2014-08-06 NOTE — Progress Notes (Signed)
Patient ID: [name] [name], male   DOB: 1951/08/22, 63 y.o.   MRN: [id number] Encounter Diagnoses  Name Primary?  Marland Kitchen Ankle fracture, left Yes  . Fibula fracture, left, closed, with routine healing, subsequent encounter     Chief Complaint  Patient presents with  . Follow-up    1 week recheck on left ankle fracture, DOI 07-20-14.    BP 188/131 mmHg  Ht 6\' 4"  (1.93 m)  Wt 190 lb (86.183 kg)  BMI 23.14 kg/m2  One-week follow-up x-ray fibular fracture in this 63 year old diabetic. No complaints. Cam Walker working well. X-ray shows fracture in good position ankle mortise intact.  Return 21st for an ankle x-ray. Continue Cam Walker weightbearing as tolerated.  Norco 5 mg every 6 #100 refill

## 2014-08-13 ENCOUNTER — Emergency Department (HOSPITAL_COMMUNITY)
Admission: EM | Admit: 2014-08-13 | Discharge: 2014-08-13 | Disposition: A | Discharge status: Home / Self Care | Payer: Medicare Other | Source: Home / Self Care | Attending: Emergency Medicine | Admitting: Emergency Medicine

## 2014-08-13 ENCOUNTER — Emergency Department (HOSPITAL_COMMUNITY): Payer: Medicare Other

## 2014-08-13 ENCOUNTER — Encounter (HOSPITAL_COMMUNITY): Payer: Self-pay | Admitting: Emergency Medicine

## 2014-08-13 ENCOUNTER — Inpatient Hospital Stay (HOSPITAL_COMMUNITY)
Admission: EM | Admit: 2014-08-13 | Discharge: 2014-08-20 | DRG: 871 | Disposition: A | Discharge status: Home / Self Care | Payer: Medicare Other | Attending: Internal Medicine | Admitting: Internal Medicine

## 2014-08-13 DIAGNOSIS — J189 Pneumonia, unspecified organism: Secondary | ICD-10-CM | POA: Diagnosis present

## 2014-08-13 DIAGNOSIS — Y95 Nosocomial condition: Secondary | ICD-10-CM | POA: Diagnosis present

## 2014-08-13 DIAGNOSIS — Z87891 Personal history of nicotine dependence: Secondary | ICD-10-CM

## 2014-08-13 DIAGNOSIS — Z951 Presence of aortocoronary bypass graft: Secondary | ICD-10-CM | POA: Insufficient documentation

## 2014-08-13 DIAGNOSIS — E785 Hyperlipidemia, unspecified: Secondary | ICD-10-CM | POA: Diagnosis present

## 2014-08-13 DIAGNOSIS — R531 Weakness: Secondary | ICD-10-CM

## 2014-08-13 DIAGNOSIS — Z8719 Personal history of other diseases of the digestive system: Secondary | ICD-10-CM | POA: Insufficient documentation

## 2014-08-13 DIAGNOSIS — E1122 Type 2 diabetes mellitus with diabetic chronic kidney disease: Secondary | ICD-10-CM | POA: Diagnosis present

## 2014-08-13 DIAGNOSIS — D72829 Elevated white blood cell count, unspecified: Secondary | ICD-10-CM | POA: Insufficient documentation

## 2014-08-13 DIAGNOSIS — I255 Ischemic cardiomyopathy: Secondary | ICD-10-CM | POA: Diagnosis present

## 2014-08-13 DIAGNOSIS — Z8619 Personal history of other infectious and parasitic diseases: Secondary | ICD-10-CM

## 2014-08-13 DIAGNOSIS — D638 Anemia in other chronic diseases classified elsewhere: Secondary | ICD-10-CM | POA: Diagnosis present

## 2014-08-13 DIAGNOSIS — Z9889 Other specified postprocedural states: Secondary | ICD-10-CM

## 2014-08-13 DIAGNOSIS — I120 Hypertensive chronic kidney disease with stage 5 chronic kidney disease or end stage renal disease: Secondary | ICD-10-CM | POA: Insufficient documentation

## 2014-08-13 DIAGNOSIS — Z862 Personal history of diseases of the blood and blood-forming organs and certain disorders involving the immune mechanism: Secondary | ICD-10-CM

## 2014-08-13 DIAGNOSIS — Z7901 Long term (current) use of anticoagulants: Secondary | ICD-10-CM | POA: Insufficient documentation

## 2014-08-13 DIAGNOSIS — I2510 Atherosclerotic heart disease of native coronary artery without angina pectoris: Secondary | ICD-10-CM | POA: Diagnosis present

## 2014-08-13 DIAGNOSIS — E119 Type 2 diabetes mellitus without complications: Secondary | ICD-10-CM

## 2014-08-13 DIAGNOSIS — M109 Gout, unspecified: Secondary | ICD-10-CM | POA: Diagnosis present

## 2014-08-13 DIAGNOSIS — I4891 Unspecified atrial fibrillation: Secondary | ICD-10-CM | POA: Insufficient documentation

## 2014-08-13 DIAGNOSIS — M6281 Muscle weakness (generalized): Secondary | ICD-10-CM | POA: Insufficient documentation

## 2014-08-13 DIAGNOSIS — D649 Anemia, unspecified: Secondary | ICD-10-CM

## 2014-08-13 DIAGNOSIS — N186 End stage renal disease: Secondary | ICD-10-CM | POA: Diagnosis present

## 2014-08-13 DIAGNOSIS — J90 Pleural effusion, not elsewhere classified: Secondary | ICD-10-CM | POA: Diagnosis present

## 2014-08-13 DIAGNOSIS — Z794 Long term (current) use of insulin: Secondary | ICD-10-CM

## 2014-08-13 DIAGNOSIS — I480 Paroxysmal atrial fibrillation: Secondary | ICD-10-CM

## 2014-08-13 DIAGNOSIS — I953 Hypotension of hemodialysis: Secondary | ICD-10-CM | POA: Insufficient documentation

## 2014-08-13 DIAGNOSIS — Z992 Dependence on renal dialysis: Secondary | ICD-10-CM

## 2014-08-13 DIAGNOSIS — A4181 Sepsis due to Enterococcus: Principal | ICD-10-CM | POA: Diagnosis present

## 2014-08-13 DIAGNOSIS — A419 Sepsis, unspecified organism: Secondary | ICD-10-CM | POA: Diagnosis present

## 2014-08-13 DIAGNOSIS — R0600 Dyspnea, unspecified: Secondary | ICD-10-CM

## 2014-08-13 DIAGNOSIS — J942 Hemothorax: Secondary | ICD-10-CM | POA: Diagnosis present

## 2014-08-13 DIAGNOSIS — B9562 Methicillin resistant Staphylococcus aureus infection as the cause of diseases classified elsewhere: Secondary | ICD-10-CM | POA: Diagnosis not present

## 2014-08-13 DIAGNOSIS — I950 Idiopathic hypotension: Secondary | ICD-10-CM | POA: Diagnosis not present

## 2014-08-13 DIAGNOSIS — R6521 Severe sepsis with septic shock: Secondary | ICD-10-CM | POA: Diagnosis present

## 2014-08-13 DIAGNOSIS — J948 Other specified pleural conditions: Secondary | ICD-10-CM | POA: Diagnosis not present

## 2014-08-13 DIAGNOSIS — R109 Unspecified abdominal pain: Secondary | ICD-10-CM

## 2014-08-13 LAB — COMPREHENSIVE METABOLIC PANEL
ALT: 20 U/L (ref 0–53)
AST: 34 U/L (ref 0–37)
Albumin: 2.5 g/dL — ABNORMAL LOW (ref 3.5–5.2)
Alkaline Phosphatase: 168 U/L — ABNORMAL HIGH (ref 39–117)
BUN: 55 mg/dL — ABNORMAL HIGH (ref 6–23)
CO2: 21 mEq/L (ref 19–32)
Calcium: 9 mg/dL (ref 8.4–10.5)
Chloride: 96 mEq/L (ref 96–112)
Creatinine, Ser: 9.42 mg/dL — ABNORMAL HIGH (ref 0.50–1.35)
GFR calc Af Amer: 6 mL/min — ABNORMAL LOW (ref >90)
GFR calc non Af Amer: 5 mL/min — ABNORMAL LOW (ref >90)
Glucose, Bld: 186 mg/dL — ABNORMAL HIGH (ref 70–99)
Potassium: 5.1 mEq/L (ref 3.7–5.3)
Sodium: 138 mEq/L (ref 137–147)
Total Bilirubin: 1.5 mg/dL — ABNORMAL HIGH (ref 0.3–1.2)
Total Protein: 7.1 g/dL (ref 6.0–8.3)

## 2014-08-13 LAB — PROTIME-INR
INR: 4.45 — ABNORMAL HIGH (ref 0.00–1.49)
INR: 4.32 — AB (ref 0.00–1.49)
PROTHROMBIN TIME: 41.7 s — AB (ref 11.6–15.2)
Prothrombin Time: 42.7 seconds — ABNORMAL HIGH (ref 11.6–15.2)

## 2014-08-13 LAB — CBC WITH DIFFERENTIAL/PLATELET
BASOS PCT: 0 % (ref 0–1)
BASOS PCT: 0 % (ref 0–1)
Basophils Absolute: 0 10*3/uL (ref 0.0–0.1)
Basophils Absolute: 0 10*3/uL (ref 0.0–0.1)
EOS PCT: 0 % (ref 0–5)
Eosinophils Absolute: 0 10*3/uL (ref 0.0–0.7)
Eosinophils Absolute: 0 10*3/uL (ref 0.0–0.7)
Eosinophils Relative: 0 % (ref 0–5)
HCT: 26.7 % — ABNORMAL LOW (ref 39.0–52.0)
HCT: 25.9 % — ABNORMAL LOW (ref 39.0–52.0)
HEMOGLOBIN: 8.1 g/dL — AB (ref 13.0–17.0)
Hemoglobin: 8.5 g/dL — ABNORMAL LOW (ref 13.0–17.0)
Lymphocytes Relative: 3 % — ABNORMAL LOW (ref 12–46)
Lymphocytes Relative: 4 % — ABNORMAL LOW (ref 12–46)
Lymphs Abs: 0.6 10*3/uL — ABNORMAL LOW (ref 0.7–4.0)
Lymphs Abs: 0.6 10*3/uL — ABNORMAL LOW (ref 0.7–4.0)
MCH: 31 pg (ref 26.0–34.0)
MCH: 30.8 pg (ref 26.0–34.0)
MCHC: 31.8 g/dL (ref 30.0–36.0)
MCHC: 31.3 g/dL (ref 30.0–36.0)
MCV: 97.4 fL (ref 78.0–100.0)
MCV: 98.5 fL (ref 78.0–100.0)
MONOS PCT: 10 % (ref 3–12)
Monocytes Absolute: 2 10*3/uL — ABNORMAL HIGH (ref 0.1–1.0)
Monocytes Absolute: 1.5 10*3/uL — ABNORMAL HIGH (ref 0.1–1.0)
Monocytes Relative: 10 % (ref 3–12)
NEUTROS ABS: 12.5 10*3/uL — AB (ref 1.7–7.7)
NEUTROS PCT: 86 % — AB (ref 43–77)
Neutro Abs: 18.2 10*3/uL — ABNORMAL HIGH (ref 1.7–7.7)
Neutrophils Relative %: 87 % — ABNORMAL HIGH (ref 43–77)
PLATELETS: 198 10*3/uL (ref 150–400)
Platelets: 195 10*3/uL (ref 150–400)
RBC: 2.74 MIL/uL — ABNORMAL LOW (ref 4.22–5.81)
RBC: 2.63 MIL/uL — ABNORMAL LOW (ref 4.22–5.81)
RDW: 17 % — AB (ref 11.5–15.5)
RDW: 16.8 % — ABNORMAL HIGH (ref 11.5–15.5)
WBC: 20.8 10*3/uL — ABNORMAL HIGH (ref 4.0–10.5)
WBC: 14.7 10*3/uL — ABNORMAL HIGH (ref 4.0–10.5)

## 2014-08-13 LAB — TYPE AND SCREEN
ABO/RH(D): O POS
Antibody Screen: NEGATIVE

## 2014-08-13 LAB — BASIC METABOLIC PANEL
Anion gap: 18 — ABNORMAL HIGH (ref 5–15)
BUN: 24 mg/dL — ABNORMAL HIGH (ref 6–23)
CHLORIDE: 93 meq/L — AB (ref 96–112)
CO2: 24 mEq/L (ref 19–32)
Calcium: 8.6 mg/dL (ref 8.4–10.5)
Creatinine, Ser: 4.79 mg/dL — ABNORMAL HIGH (ref 0.50–1.35)
GFR calc non Af Amer: 12 mL/min — ABNORMAL LOW (ref >90)
GFR, EST AFRICAN AMERICAN: 13 mL/min — AB (ref >90)
Glucose, Bld: 247 mg/dL — ABNORMAL HIGH (ref 70–99)
POTASSIUM: 3.8 meq/L (ref 3.7–5.3)
SODIUM: 135 meq/L — AB (ref 137–147)

## 2014-08-13 LAB — CBG MONITORING, ED
GLUCOSE-CAPILLARY: 169 mg/dL — AB (ref 70–99)
Glucose-Capillary: 169 mg/dL — ABNORMAL HIGH (ref 70–99)

## 2014-08-13 LAB — TROPONIN I: Troponin I: <0.3 ng/mL (ref <0.30)

## 2014-08-13 LAB — GLUCOSE, CAPILLARY: GLUCOSE-CAPILLARY: 159 mg/dL — AB (ref 70–99)

## 2014-08-13 LAB — TSH: TSH: 1.21 u[IU]/mL (ref 0.350–4.500)

## 2014-08-13 MED ORDER — DEXTROSE 5 % IV SOLN
1.0000 g | Freq: Once | INTRAVENOUS | Status: AC
  Administered 2014-08-14: 1 g via INTRAVENOUS
  Filled 2014-08-13 (×2): qty 1

## 2014-08-13 MED ORDER — IOHEXOL 300 MG/ML  SOLN: 50.0000 mL | Freq: Once | INTRAMUSCULAR | Status: AC | PRN

## 2014-08-13 MED ORDER — PANTOPRAZOLE SODIUM 40 MG PO TBEC
40.0000 mg | DELAYED_RELEASE_TABLET | Freq: Two times a day (BID) | ORAL | Status: DC
  Administered 2014-08-14 – 2014-08-20 (×9): 40 mg via ORAL
  Filled 2014-08-13 (×7): qty 1

## 2014-08-13 MED ORDER — INSULIN ASPART 100 UNIT/ML ~~LOC~~ SOLN
0.0000 [IU] | Freq: Three times a day (TID) | SUBCUTANEOUS | Status: DC
  Administered 2014-08-14 (×2): 2 [IU] via SUBCUTANEOUS
  Administered 2014-08-14: 3 [IU] via SUBCUTANEOUS
  Administered 2014-08-15: 2 [IU] via SUBCUTANEOUS
  Administered 2014-08-15: 3 [IU] via SUBCUTANEOUS
  Administered 2014-08-16 (×2): 2 [IU] via SUBCUTANEOUS
  Administered 2014-08-16: 1 [IU] via SUBCUTANEOUS
  Administered 2014-08-17 (×3): 2 [IU] via SUBCUTANEOUS
  Administered 2014-08-19 – 2014-08-20 (×3): 1 [IU] via SUBCUTANEOUS

## 2014-08-13 MED ORDER — HYDROCODONE-ACETAMINOPHEN 5-325 MG PO TABS
1.0000 | ORAL_TABLET | Freq: Four times a day (QID) | ORAL | Status: DC | PRN
Start: 2014-08-13 — End: 2014-08-20

## 2014-08-13 MED ORDER — TRAMADOL HCL 50 MG PO TABS: 50.0000 mg | ORAL_TABLET | Freq: Three times a day (TID) | ORAL | Status: DC | PRN

## 2014-08-13 MED ORDER — DEXTROSE 5 % IV SOLN
1.0000 g | Freq: Once | INTRAVENOUS | Status: AC
  Administered 2014-08-13: 1 g via INTRAVENOUS
  Filled 2014-08-13: qty 1

## 2014-08-13 MED ORDER — IOHEXOL 300 MG/ML  SOLN
100.0000 mL | Freq: Once | INTRAMUSCULAR | Status: AC | PRN
  Administered 2014-08-13: 100 mL via INTRAVENOUS

## 2014-08-13 MED ORDER — SODIUM CHLORIDE 0.9 % IV SOLN
INTRAVENOUS | Status: DC
  Administered 2014-08-13: 10 mL/h via INTRAVENOUS

## 2014-08-13 MED ORDER — ONDANSETRON HCL 4 MG PO TABS: 4.0000 mg | ORAL_TABLET | Freq: Four times a day (QID) | ORAL | Status: DC | PRN

## 2014-08-13 MED ORDER — SEVELAMER CARBONATE 800 MG PO TABS
800.0000 mg | ORAL_TABLET | Freq: Three times a day (TID) | ORAL | Status: DC
  Administered 2014-08-14 – 2014-08-20 (×16): 800 mg via ORAL
  Filled 2014-08-13 (×25): qty 1

## 2014-08-13 MED ORDER — SODIUM CHLORIDE 0.9 % IV BOLUS (SEPSIS)
250.0000 mL | Freq: Once | INTRAVENOUS | Status: AC
  Administered 2014-08-13: 250 mL via INTRAVENOUS

## 2014-08-13 MED ORDER — FOLIC ACID 1 MG PO TABS
1.0000 mg | ORAL_TABLET | Freq: Every day | ORAL | Status: DC
  Administered 2014-08-14 – 2014-08-20 (×5): 1 mg via ORAL
  Filled 2014-08-13 (×9): qty 1

## 2014-08-13 MED ORDER — CINACALCET HCL 30 MG PO TABS
30.0000 mg | ORAL_TABLET | Freq: Every day | ORAL | Status: DC
  Administered 2014-08-14 – 2014-08-20 (×6): 30 mg via ORAL
  Filled 2014-08-13 (×10): qty 1

## 2014-08-13 MED ORDER — ONDANSETRON HCL 4 MG/2ML IJ SOLN
4.0000 mg | Freq: Four times a day (QID) | INTRAMUSCULAR | Status: DC | PRN
  Administered 2014-08-14: 4 mg via INTRAVENOUS
  Filled 2014-08-13: qty 2

## 2014-08-13 MED ORDER — ALBUTEROL SULFATE (2.5 MG/3ML) 0.083% IN NEBU: 2.5000 mg | INHALATION_SOLUTION | RESPIRATORY_TRACT | Status: DC | PRN

## 2014-08-13 MED ORDER — SODIUM CHLORIDE 0.9 % IV SOLN: INTRAVENOUS | Status: DC

## 2014-08-13 MED ORDER — CEFEPIME HCL 2 G IJ SOLR
2.0000 g | INTRAMUSCULAR | Status: DC
  Administered 2014-08-15: 2 g via INTRAVENOUS
  Filled 2014-08-13 (×2): qty 2

## 2014-08-13 MED ORDER — SODIUM CHLORIDE 0.9 % IJ SOLN
3.0000 mL | Freq: Two times a day (BID) | INTRAMUSCULAR | Status: DC
  Administered 2014-08-14 – 2014-08-20 (×7): 3 mL via INTRAVENOUS

## 2014-08-13 MED ORDER — ATORVASTATIN CALCIUM 20 MG PO TABS
20.0000 mg | ORAL_TABLET | Freq: Every evening | ORAL | Status: DC
  Administered 2014-08-14 – 2014-08-20 (×8): 20 mg via ORAL
  Filled 2014-08-13 (×10): qty 1

## 2014-08-13 MED ORDER — AMIODARONE HCL 200 MG PO TABS
200.0000 mg | ORAL_TABLET | Freq: Two times a day (BID) | ORAL | Status: DC
  Administered 2014-08-13: 200 mg via ORAL
  Filled 2014-08-13 (×6): qty 1

## 2014-08-13 MED ORDER — DEXTROSE 5 % IV SOLN
5.0000 mg | Freq: Once | INTRAVENOUS | Status: DC
  Filled 2014-08-13: qty 0.5

## 2014-08-13 MED ORDER — METOPROLOL TARTRATE 50 MG PO TABS
75.0000 mg | ORAL_TABLET | Freq: Two times a day (BID) | ORAL | Status: DC
  Administered 2014-08-14 – 2014-08-18 (×6): 75 mg via ORAL
  Filled 2014-08-13 (×16): qty 1

## 2014-08-13 MED ORDER — SODIUM CHLORIDE 0.9 % IV SOLN
1500.0000 mg | Freq: Once | INTRAVENOUS | Status: AC
  Administered 2014-08-13: 1500 mg via INTRAVENOUS
  Filled 2014-08-13: qty 1500

## 2014-08-13 MED ORDER — INSULIN GLARGINE 100 UNIT/ML ~~LOC~~ SOLN
10.0000 [IU] | Freq: Every day | SUBCUTANEOUS | Status: DC
  Administered 2014-08-14 – 2014-08-19 (×6): 10 [IU] via SUBCUTANEOUS
  Filled 2014-08-13 (×12): qty 0.1

## 2014-08-13 MED ORDER — INSULIN ASPART 100 UNIT/ML ~~LOC~~ SOLN
0.0000 [IU] | Freq: Every day | SUBCUTANEOUS | Status: DC
  Administered 2014-08-15 – 2014-08-16 (×2): 2 [IU] via SUBCUTANEOUS

## 2014-08-13 MED ORDER — ALLOPURINOL 100 MG PO TABS
100.0000 mg | ORAL_TABLET | Freq: Every day | ORAL | Status: DC
  Administered 2014-08-14 – 2014-08-20 (×5): 100 mg via ORAL
  Filled 2014-08-13 (×10): qty 1

## 2014-08-13 MED ORDER — TRAZODONE HCL 50 MG PO TABS
50.0000 mg | ORAL_TABLET | Freq: Every day | ORAL | Status: DC
  Administered 2014-08-15 – 2014-08-19 (×5): 50 mg via ORAL
  Filled 2014-08-13 (×8): qty 1

## 2014-08-13 MED ORDER — VANCOMYCIN HCL IN DEXTROSE 1-5 GM/200ML-% IV SOLN
1000.0000 mg | INTRAVENOUS | Status: DC
  Administered 2014-08-15: 1000 mg via INTRAVENOUS
  Filled 2014-08-13 (×2): qty 200

## 2014-08-13 MED ORDER — MIDODRINE HCL 5 MG PO TABS
5.0000 mg | ORAL_TABLET | Freq: Three times a day (TID) | ORAL | Status: DC
  Administered 2014-08-14 – 2014-08-15 (×5): 5 mg via ORAL
  Filled 2014-08-13 (×13): qty 1

## 2014-08-13 MED ORDER — SODIUM CHLORIDE 0.9 % IJ SOLN
3.0000 mL | Freq: Two times a day (BID) | INTRAMUSCULAR | Status: DC
  Administered 2014-08-14 (×2): 3 mL via INTRAVENOUS

## 2014-08-13 NOTE — ED Notes (Signed)
Hospitalist at the bedside 

## 2014-08-13 NOTE — H&P (Addendum)
[facility] History and Physical  KAILASH HINZE UXL:[id number] DOB: 13-Mar-1951 DOA: 08/13/2014  Referring physician: ER PCP: [name], MD   Chief Complaint: Abdominal discomfort  HPI: [name] [name] is a 63 y.o. male  This is a 63 year old man who presented to the emergency room with a one-day history of abdominal pain which was of gradual onset. He denies any vomiting. He has not opened his bowels for a couple of days. He has had decreased appetite that started a couple weeks ago and, according to wife, blood in his stools 2 days ago. The patient takes warfarin for atrial fibrillation. The patient has end-stage renal disease on hemodialysis. He denies any melena. He denies any cough, dyspnea or fever. He is diabetic. Evaluation in the emergency room so far with a CT abdominal scan shows the presence of a large loculated pleural effusion. He is now being admitted for further investigation and management.   Review of Systems:  Apart from the symptoms mentioned above, all other systems negative.  Past Medical History  Diagnosis Date  . UGI bleed 02/12/11    On anticoagulation; EGD w/snare polypectomy-multiple polypoid lesions antrum(benign), Chronic active gastritis (NEGATIVE H pylori)  . Anemia, chronic disease   . Sepsis due to enterococcus 02/11/11  . Atrial fibrillation     On coumadin  . Coronary atherosclerosis of native coronary artery     Multivessel status post CABG  . Cardiomyopathy     LVEF 40-45% 01/2011  . Type 2 diabetes mellitus   . Essential hypertension, benign   . Hyperlipemia   . Gout   . S/P patent foramen ovale closure   . ESRD on hemodialysis     Started dialysis sometime around 2008-2009.  Gets HD in [place], [place] with Dr Hinda Lenis on a TTS schedule.  Runs 3.5 hours with dry wt 86.5 kg as of June 2015.  Had a L arm AVF which didn't work and has been using his R forearm AVF for several years.    . Hyperbilirubinemia 08/10/2013  . SBO (small bowel  obstruction) 01/2014  . 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 Clostridium difficile colitis 01/2014  . Diverticulosis   . Internal hemorrhoids    Past Surgical History  Procedure Laterality Date  . Mitral valve replacement  01/05/2007    Bioprosthetic 41mm Edwards precordial tissue (Dr. Servando Snare)  . Coronary artery bypass graft  01/05/2007    LIMA-LAD, SVG-OM, seq. SVG-PDA, PLA-RCA  . Colonoscopy  06/23/2011    Dr. Oneida Alar: multiple sessile and pedunculated polyps, moderate diverticulosis, internal hemorrhoids, +ADENOMATOUS POLYPS, consider genetic testing, surveillance in October 2015   . Esophagogastroduodenoscopy  02/12/2011    CHRONIC ACTIVE GASTRITIS, NO H. pylori  . Transthoracic echocardiogram  11/2011    EF 25% w/ mod dilatation of LV & mod LVH; LA severely dilated; mod-severe dilation of RV with mod-severe decrease in RV function; mild MR & TR  . Cardioversion  04/19/2007    Dr. Marella Chimes  . Transthoracic echocardiogram  02/14/2011    EF 40-45%, mod conc LVH; ventricular septum with motion showing abnormal function, dyssynergy, paradox; mild AS; mild-mod MR stenosis; LA severely dilated; aptrial septum bowed from L to R with increased LA pressure   . Amputation Right 07/14/2013    Procedure: AMPUTATION DIGIT;  Surgeon: [name], MD;  Location: [facility];  Service: General;  Laterality: Right;  . Amputation Right 08/08/2013    Procedure: AMPUTATION BELOW KNEE;  Surgeon: [name], MD;  Location: [facility];  Service: General;  Laterality: Right;  . Stump revision Right 09/19/2013    Procedure: STUMP REVISION;  Surgeon: Scherry Ran, MD;  Location: [facility];  Service: General;  Laterality: Right;  . Amputation Right 10/17/2013    Procedure: AMPUTATION ABOVE KNEE;  Surgeon: [name], MD;  Location: [facility];  Service: General;  Laterality: Right;  . Colonoscopy N/A 04/10/2014    Procedure: COLONOSCOPY;  Surgeon: [name], MD;  Location: [facility];  Service: Endoscopy;   Laterality: N/A;  830   Social History:  reports that he quit smoking about 7 years ago. His smoking use included Cigarettes. He started smoking about 45 years ago. He has a 8 pack-year smoking history. He has never used smokeless tobacco. He reports that he does not drink alcohol or use illicit drugs.  Allergies  Allergen Reactions  . Bacitracin Other (See Comments)    unknown  . Norvasc [Amlodipine Besylate] Other (See Comments)    unknown    Family History  Problem Relation Age of Onset  . Diabetes Mother   . Diabetes Father   . Diabetes Sister   . Colon cancer Neg Hx   . Colon polyps Neg Hx      Prior to Admission medications   Medication Sig Start Date End Date Taking? Authorizing Provider  albuterol (PROVENTIL) (2.5 MG/3ML) 0.083% nebulizer solution Take 3 mLs (2.5 mg total) by nebulization every 2 (two) hours as needed for wheezing. 02/21/14  Yes Kinnie Feil, MD  allopurinol (ZYLOPRIM) 100 MG tablet Take 100 mg by mouth daily.  05/15/11  Yes Historical Provider, MD  amiodarone (PACERONE) 200 MG tablet Take 200 mg by mouth 2 (two) times daily.  07/20/14  Yes Historical Provider, MD  atorvastatin (LIPITOR) 20 MG tablet Take 1 tablet (20 mg total) by mouth every evening. 04/20/14  Yes Satira Sark, MD  B Complex-C-Zn-Folic Acid (DIALYVITE/ZINC PO) Take by mouth 3 (three) times a week. Patient has dialysis on Tuesdays, Thursdays, and Saturdays   Yes Historical Provider, MD  folic acid (FOLVITE) 1 MG tablet Take 1 mg by mouth daily.     Yes Historical Provider, MD  HYDROcodone-acetaminophen (NORCO/VICODIN) 5-325 MG per tablet Take 1 tablet by mouth every 6 (six) hours as needed for moderate pain. 08/06/14  Yes Carole Civil, MD  insulin glargine (LANTUS) 100 UNIT/ML injection Inject 10 Units into the skin at bedtime.   Yes Historical Provider, MD  metoprolol (LOPRESSOR) 50 MG tablet Take 75 mg by mouth 2 (two) times daily. 07/04/14  Yes Historical Provider, MD  midodrine  (PROAMATINE) 5 MG tablet Take 1 tablet (5 mg total) by mouth 3 (three) times daily before meals. 02/21/14  Yes Kinnie Feil, MD  pantoprazole (PROTONIX) 40 MG tablet Take 40 mg by mouth 2 (two) times daily.  05/25/11  Yes Historical Provider, MD  RENVELA 800 MG tablet Take 800 mg by mouth 3 (three) times daily with meals.  05/03/11  Yes Historical Provider, MD  SENSIPAR 30 MG tablet Take 30 mg by mouth daily. 01/22/14  Yes Historical Provider, MD  traMADol (ULTRAM) 50 MG tablet Take by mouth every 8 (eight) hours as needed for moderate pain.   Yes Historical Provider, MD  traZODone (DESYREL) 50 MG tablet Take 1 tablet (50 mg total) by mouth at bedtime. 08/13/13  Yes Nita Sells, MD  warfarin (COUMADIN) 2.5 MG tablet Take 1.25 tablets by mouth daily.  03/30/14  Yes Historical Provider,  MD  metoprolol tartrate (LOPRESSOR) 25 MG tablet Take 0.5 tablets (12.5 mg total) by mouth 2 (two) times daily. Patient not taking: Reported on 08/13/2014 03/03/14   Allie Bossier, MD   Physical Exam: Filed Vitals:   08/13/14 1253 08/13/14 1535  BP: 113/79 112/100  Pulse: 54 84  Temp: 99.2 F (37.3 C)   TempSrc: Oral   Resp: 18 18  Height: 6\' 4"  (1.93 m)   Weight: 90.719 kg (200 lb)   SpO2: 92% 93%    Wt Readings from Last 3 Encounters:  08/13/14 90.719 kg (200 lb)  08/13/14 90.719 kg (200 lb)  08/06/14 86.183 kg (190 lb)    General:  Appears calm and comfortable. He does not clinically look septic. Eyes: PERRL, normal lids, irises & conjunctiva ENT: grossly normal hearing, lips & tongue Neck: no LAD, masses or thyromegaly Cardiovascular: Irregular heart rate, consistent with atrial fibrillation. Telemetry: Atrial fibrillation. Respiratory: Dullness to percussion in the right mid and lower zones. Reduced air entry in the right midline lower zones. There is no crackles, wheezing or bronchial breathing. Abdomen: soft, ntnd. I cannot feel any masses. Bowel sounds are present but somewhat scanty.  There is no tenderness. Skin: no rash or induration seen on limited exam Musculoskeletal: Right below-knee amputation. Psychiatric: grossly normal mood and affect, speech fluent and appropriate Neurologic: grossly non-focal.          Labs on Admission:  Basic Metabolic Panel:  Recent Labs Lab 08/13/14 0403 08/13/14 1402  NA 138 135*  K 5.1 3.8  CL 96 93*  CO2 21 24  GLUCOSE 186* 247*  BUN 55* 24*  CREATININE 9.42* 4.79*  CALCIUM 9.0 8.6   Liver Function Tests:  Recent Labs Lab 08/13/14 0403  AST 34  ALT 20  ALKPHOS 168*  BILITOT 1.5*  PROT 7.1  ALBUMIN 2.5*   No results for input(s): LIPASE, AMYLASE in the last 168 hours. No results for input(s): AMMONIA in the last 168 hours. CBC:  Recent Labs Lab 08/13/14 0403 08/13/14 1402  WBC 20.8* 14.7*  NEUTROABS 18.2* 12.5*  HGB 8.5* 8.1*  HCT 26.7* 25.9*  MCV 97.4 98.5  PLT 195 198   Cardiac Enzymes:  Recent Labs Lab 08/13/14 0403 08/13/14 1402  TROPONINI <0.30 <0.30    BNP (last 3 results) No results for input(s): PROBNP in the last 8760 hours. CBG:  Recent Labs Lab 08/13/14 0407  GLUCAP 169*    Radiological Exams on Admission: Dg Chest 2 View  08/13/2014   CLINICAL DATA:  Weakness and abdominal pain.  Diabetes.  HTN.  EXAM: CHEST  2 VIEW  COMPARISON:  CT chest today  FINDINGS: Right pleural effusion, loculated on CT. Right lower lobe atelectasis or infiltrate. This could represent pneumonia and empyema. Correlate with fever and white count.  Cardiac enlargement with mild vascular congestion. No edema. Left lung clear.  Stent in the right upper lobe pulmonary artery of uncertain significance. This is unchanged from a CT of 12/06/2011.  IMPRESSION: Cardiac enlargement with vascular congestion.  Negative for edema.  Right pleural effusion and right lower lobe airspace disease. Possible pneumonia. Correlate with clinical symptoms.   Electronically Signed   By: Franchot Gallo M.D.   On: 08/13/2014 15:37     Ct Head Wo Contrast  08/13/2014   CLINICAL DATA:  Altered mental status and low-grade fever  EXAM: CT HEAD WITHOUT CONTRAST  TECHNIQUE: Contiguous axial images were obtained from the base of the skull through the vertex without  intravenous contrast.  COMPARISON:  February 11, 2011  FINDINGS: Moderate diffuse atrophy is stable. Prominence of the cisterna magna is an anatomic variant. There is no demonstrable intracranial mass, acute hemorrhage, extra-axial fluid collection, or midline shift. There is evidence of a prior infarct in the mid left parietal lobe, stable. There is evidence of a prior infarct in the mid portion of the left external capsule, stable. A small focus of calcification in this area may represent residua of prior hemorrhage. There is patchy small vessel disease throughout the centra semiovale bilaterally. There is no new gray-white compartment lesion. No acute appearing infarct appreciable. The bony calvarium appears intact. The mastoid air cells are clear.  IMPRESSION: Atrophy with prior infarcts and small vessel disease, stable. No acute hemorrhage. No mass or extra-axial fluid collection. No acute appearing infarct.   Electronically Signed   By: Lowella Grip M.D.   On: 08/13/2014 15:08   Ct Abdomen Pelvis W Contrast  08/13/2014   CLINICAL DATA:  Weakness.  Dialysis patient.  Diabetes.  EXAM: CT ABDOMEN AND PELVIS WITH CONTRAST  TECHNIQUE: Multidetector CT imaging of the abdomen and pelvis was performed using the standard protocol following bolus administration of intravenous contrast.  CONTRAST:  148mL OMNIPAQUE IOHEXOL 300 MG/ML  SOLN  COMPARISON:  CT abdomen pelvis 02/27/2014  FINDINGS: Loculated right lower pleural effusion measures 9 x 15 cm. This has enhancing wall. There was high-density in this fluid on the prior CT and this may be an organizing hemothorax. Empyema not excluded. If the patient has fever and white count, thoracentesis recommended. There is compressive  atelectasis in the right lower lobe. Calcified granuloma left lower lobe unchanged. Small nodule left lower lobe not present but this area was not scanned today.  Cardiac enlargement with coronary artery calcification and CABG.  Liver and spleen normal. Small calcified gallstones. Gallbladder is contracted and not thickened. Bile ducts nondilated. Pancreas and spleen are normal.  Small kidneys due to chronic renal failure. No renal mass or hydronephrosis.  Negative for bowel obstruction. No bowel thickening. Negative for diverticulitis. Normal appendix. Resolution of thickening of the cecum compared with the prior study which may be resolution of colitis. No underlying mass.  Advanced atherosclerotic disease without aortic aneurysm.  Chronic compression fracture L1 unchanged. Mild chronic fracture L4 unchanged. No acute spinal abnormality.  IMPRESSION: Large loculated right pleural effusion. This may represent contracting hemothorax based on the prior CT. If the patient has a fever or white count, thoracentesis recommended to exclude empyema.  Calcified gallstones.  Normal appendix.  Resolution of cecal thickening since the prior CT.   Electronically Signed   By: Franchot Gallo M.D.   On: 08/13/2014 15:18    EKG: Independently reviewed. Atrial fibrillation. No acute ST-T wave changes.  Assessment/Plan Active Problems:   Pleural effusion, right   Atrial fibrillation   Plemmons term current use of anticoagulant therapy   ESRD (end stage renal disease) on dialysis   DM type 2 causing ESRD   Recurrent right pleural effusion   1. Abdominal pain, no clear evidence of abdominal pathology. I think she is abdominal discomfort may be related to his right pleural effusion. 2. Probable loculated  right pleural effusion. His white blood cell count has been elevated. Blood cultures have been taken. He does not look septic but we will treat him with broad-spectrum antibiotics. He will need thoracentesis. Thoracic  surgery will need to be consulted. 3. Diabetes mellitus. Continue with Lantus insulin and sliding scale of insulin.  4. End-stage renal disease on hemodialysis. He will need nephrology consultation. 5. Atrial fibrillation on warfarin therapy. INR is supratherapeutic. I will give the patient vitamin K to reverse the effects of warfarin as he will need thoracentesis.   Further recommendations will depend on patient's hospital progress. I have spoken with my colleague at [facility], Dr. Renne Crigler  who has  accepted the patient in transfer.   Code Status: Full code.   DVT Prophylaxis: SCDs.  Family Communication: I discussed the plan with the patient at the bedside.   Disposition Plan: Home when medically stable.   Time spent: 60 minutes.  Doree Albee [facility] Pager [phone].

## 2014-08-13 NOTE — ED Notes (Signed)
Patient c/o tightness in abd. Per patient was seen here this morning for pain but was discharge. Per patient no real diagnosis. Patient reports going to have dialysis today and now feels generalized weakness with abd tightness. Denies any nausea, vomiting, or diarrhea. Patient does have low grade fever. Per patient last BM was Tuesday, reports some blood in stool then.

## 2014-08-13 NOTE — Progress Notes (Addendum)
RN paged this NP secondary to tele reading SVT in the 140s. Pt just transferred from [facility] for pleural effusion, ? empyema and will need thoracentesis. Pt also has a hx of:  ESRD s/p HD today, Afib on Amiodarone at home, CAD, and cardiomyopathy with last EF 40% (actually later, found 4/15 echo with 15-25% EF), among others. NP ordered stat 12 lead EKG and went to bedside.   S: pt denies chest pain, SOB, dizziness and palpitations. Says he has been tired since HD today. He feels "fine".  O: Appears well, in NAD. Alert and oriented. Card: irreg irreg. Resp even and unlabored at 16. Tele had "SVT" as reading but disagree. Tele shows Afib per this NP which is confirmed by 12 lead. Rate in 140s. BP 80/60. This NP also verified EKG with Dr. Posey Pronto.  A/P: 1. Afib with RVR with hx of Afib rate controlled at home with amiodarone. Given loculated effusion ? empyema with elevated WBCC, his increased HR is likely from infection. Can not give Metoprolol/Cardizem at this time secondary to hypotension. Will wait and see what HR is after bolus-see below.  2. Hypotension-has had this since post-HD today. EMT gave bolus in ambulance on way to [facility]. Will give 250cc bolus now and follow manual BP afterwards. He may need further boluses.  3. ESRD s/p HD today-likely too much fluid was taken. Bolus as above. Creatinine decreased since HD.  Further plan depends on response to fluids. May need cardio opinion on RVR and treating with Amiodarone IV in face of po med at home. For now, he is completely asymptomatic.  Clance Boll, NP [facility] Update: After administration of pt's home po Amiodarone and 2 NS boluses, HR remains 131 and BP still in the mid to high 80s. Pt has not had mental status change and still feels fine. This NP called cardio fellow, Dr. Oval Linsey, for advice on meds to control rate given his low BP. She agreed that a bolus of Amiodarone IV was appropriate, ran slowly given pt's ESRD. Bolus  ordered and will monitor. If HR doesn't decrease < 120 after this, will move to SDU.  KJKG, NP Note that pt is on Coumadin at home for Afib and arrived with a supra therapeutic INR and Coumadin is presently on hold. Also, received Vit K. Recheck INR this am. Also note, that pt had an echo in April 2015 which showed a markedly decreased EF of 15-20% with Grade III diastolic dysfunction.  KJKG, NP Update: Went to see pt again. Actually is more alert and denies any issues at present. His BP has come up after Amiodarone bolus but HR still 130. He appears well and BP is up. Discussed again with Dr. Posey Pronto. Will try Amiodarone drip and follow BP and HR. If BP drops, likely need to send him to SDU.  KJKG, NP Update: After one hour on Amiodarone drip, HR is still in the 130-140 range. BP was up to 93 systolic but is now 70/01. Transfer pt to SDU for closer monitoring. Get cardio consult this am.  Patrica Duel, NP

## 2014-08-13 NOTE — ED Provider Notes (Signed)
CSN: [id number]     Arrival date & time 08/13/14  0309 History   First MD Initiated Contact with Patient 08/13/14 0335     Chief Complaint  Patient presents with  . Fatigue     (Consider location/radiation/quality/duration/timing/severity/associated sxs/prior Treatment) HPI  This is a 63 year old male with a stage renal disease on hemodialysis. He recently had a fracture of his left ankle and is ambulating on. He is status post right AKA with a prosthesis. He is here with generalized weakness since yesterday evening about 9 PM. The weakness is not focal and is described as difficulty standing or ambulating. He has had a poor appetite recently. His last dialysis was 2 days ago. He denies fever, chills, chest pain, shortness of breath, abdominal pain, nausea, vomiting or diarrhea. He makes very little urine and this is not changed. He has chronic atrial fibrillation and is on Coumadin.  Past Medical History  Diagnosis Date  . UGI bleed 02/12/11    On anticoagulation; EGD w/snare polypectomy-multiple polypoid lesions antrum(benign), Chronic active gastritis (NEGATIVE H pylori)  . Anemia, chronic disease   . Sepsis due to enterococcus 02/11/11  . Atrial fibrillation     On coumadin  . Coronary atherosclerosis of native coronary artery     Multivessel status post CABG  . Cardiomyopathy     LVEF 40-45% 01/2011  . Type 2 diabetes mellitus   . Essential hypertension, benign   . Hyperlipemia   . Gout   . S/P patent foramen ovale closure   . ESRD on hemodialysis     Started dialysis sometime around 2008-2009.  Gets HD in [place], [place] with Dr Hinda Lenis on a TTS schedule.  Runs 3.5 hours with dry wt 86.5 kg as of June 2015.  Had a L arm AVF which didn't work and has been using his R forearm AVF for several years.    . Hyperbilirubinemia 08/10/2013  . SBO (small bowel obstruction) 01/2014  . 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 Clostridium difficile colitis 01/2014  . Diverticulosis   . Internal  hemorrhoids    Past Surgical History  Procedure Laterality Date  . Mitral valve replacement  01/05/2007    Bioprosthetic 72mm Edwards precordial tissue (Dr. Servando Snare)  . Coronary artery bypass graft  01/05/2007    LIMA-LAD, SVG-OM, seq. SVG-PDA, PLA-RCA  . Colonoscopy  06/23/2011    Dr. Oneida Alar: multiple sessile and pedunculated polyps, moderate diverticulosis, internal hemorrhoids, +ADENOMATOUS POLYPS, consider genetic testing, surveillance in October 2015   . Esophagogastroduodenoscopy  02/12/2011    CHRONIC ACTIVE GASTRITIS, NO H. pylori  . Transthoracic echocardiogram  11/2011    EF 25% w/ mod dilatation of LV & mod LVH; LA severely dilated; mod-severe dilation of RV with mod-severe decrease in RV function; mild MR & TR  . Cardioversion  04/19/2007    Dr. Marella Chimes  . Transthoracic echocardiogram  02/14/2011    EF 40-45%, mod conc LVH; ventricular septum with motion showing abnormal function, dyssynergy, paradox; mild AS; mild-mod MR stenosis; LA severely dilated; aptrial septum bowed from L to R with increased LA pressure   . Amputation Right 07/14/2013    Procedure: AMPUTATION DIGIT;  Surgeon: [name], MD;  Location: [facility];  Service: General;  Laterality: Right;  . Amputation Right 08/08/2013    Procedure: AMPUTATION BELOW KNEE;  Surgeon: [name], MD;  Location: [facility];  Service: General;  Laterality: Right;  . Stump revision Right 09/19/2013    Procedure: STUMP  REVISION;  Surgeon: Scherry Ran, MD;  Location: [facility];  Service: General;  Laterality: Right;  . Amputation Right 10/17/2013    Procedure: AMPUTATION ABOVE KNEE;  Surgeon: [name], MD;  Location: [facility];  Service: General;  Laterality: Right;  . Colonoscopy N/A 04/10/2014    Procedure: COLONOSCOPY;  Surgeon: [name], MD;  Location: [facility];  Service: Endoscopy;  Laterality: N/A;  30   Family History  Problem Relation Age of Onset  . Diabetes Mother   . Diabetes Father   . Diabetes  Sister   . Colon cancer Neg Hx   . Colon polyps Neg Hx    History  Substance Use Topics  . Smoking status: Former Smoker -- 1.00 packs/day for 8 years    Types: Cigarettes    Start date: 04/27/1969    Quit date: 11/27/2006  . Smokeless tobacco: Never Used     Comment: quit about 5 yrs  . Alcohol Use: No    Review of Systems  All other systems reviewed and are negative.  Allergies  Bacitracin and Norvasc  Home Medications   Prior to Admission medications   Medication Sig Start Date End Date Taking? Authorizing Provider  albuterol (PROVENTIL) (2.5 MG/3ML) 0.083% nebulizer solution Take 3 mLs (2.5 mg total) by nebulization every 2 (two) hours as needed for wheezing. 02/21/14   Kinnie Feil, MD  allopurinol (ZYLOPRIM) 100 MG tablet Take 100 mg by mouth daily.  05/15/11   Historical Provider, MD  amiodarone (PACERONE) 200 MG tablet Take 200 mg by mouth 2 (two) times daily.  07/20/14   Historical Provider, MD  atorvastatin (LIPITOR) 20 MG tablet Take 1 tablet (20 mg total) by mouth every evening. 04/20/14   Satira Sark, MD  B Complex-C-Zn-Folic Acid (DIALYVITE/ZINC PO) Take by mouth 3 (three) times a week. Patient has dialysis on Tuesdays, Thursdays, and Saturdays    Historical Provider, MD  folic acid (FOLVITE) 1 MG tablet Take 1 mg by mouth daily.      Historical Provider, MD  HYDROcodone-acetaminophen (NORCO/VICODIN) 5-325 MG per tablet Take 1 tablet by mouth every 6 (six) hours as needed for moderate pain. 08/06/14   Carole Civil, MD  insulin glargine (LANTUS) 100 UNIT/ML injection Inject 10 Units into the skin at bedtime.    Historical Provider, MD  metoprolol (LOPRESSOR) 50 MG tablet Take 75 mg by mouth 2 (two) times daily. 07/04/14   Historical Provider, MD  metoprolol tartrate (LOPRESSOR) 25 MG tablet Take 0.5 tablets (12.5 mg total) by mouth 2 (two) times daily. 03/03/14   Allie Bossier, MD  midodrine (PROAMATINE) 5 MG tablet Take 1 tablet (5 mg total) by mouth 3  (three) times daily before meals. 02/21/14   Kinnie Feil, MD  pantoprazole (PROTONIX) 40 MG tablet Take 40 mg by mouth 2 (two) times daily.  05/25/11   Historical Provider, MD  RENVELA 800 MG tablet Take 800 mg by mouth 3 (three) times daily with meals.  05/03/11   Historical Provider, MD  SENSIPAR 30 MG tablet Take 30 mg by mouth daily. 01/22/14   Historical Provider, MD  traMADol (ULTRAM) 50 MG tablet Take by mouth every 8 (eight) hours as needed for moderate pain.    Historical Provider, MD  traZODone (DESYREL) 50 MG tablet Take 1 tablet (50 mg total) by mouth at bedtime. 08/13/13   Nita Sells, MD  warfarin (COUMADIN) 2.5 MG tablet Take 1.25 tablets by mouth daily.  03/30/14  Historical Provider, MD   BP 96/49 mmHg  Pulse 74  Temp(Src) 99 F (37.2 C) (Oral)  Resp 18  Ht 6\' 4"  (1.93 m)  Wt 200 lb (90.719 kg)  BMI 24.35 kg/m2  SpO2 95%   Physical Exam  General: Well-developed, well-nourished male in no acute distress; appearance consistent with age of record HENT: normocephalic; atraumatic Eyes: pupils equal, round and reactive to light; extraocular muscles intact Neck: supple Heart: Irregular rhythm; no murmur heard; rate in 90s to low 100s Lungs: clear to auscultation bilaterally Abdomen: soft; nondistended; nontender; no masses or hepatosplenomegaly; bowel sounds present Extremities: Right AKA; tenderness of left ankle; dialysis fistula right forearm with pulse and thrill Neurologic: Awake, alert and oriented; motor function intact in all extremities and symmetric; no facial droop Skin: Warm and dry Psychiatric: Normal mood and affect   ED Course  Procedures (including critical care time)   EKG Interpretation   Date/Time:  Thursday August 13 2014 03:26:29 EST Ventricular Rate:  115 PR Interval:    QRS Duration: 117 QT Interval:  387 QTC Calculation: 535 R Axis:   97 Text Interpretation:  Atrial fibrillation with RVR Nonspecific  intraventricular conduction  delay Abnormal lateral Q waves Anterior  infarct, old No significant change was found Confirmed by Florina Ou  MD, Jenny Reichmann  [phone]) on 08/13/2014 3:36:56 AM      MDM  Nursing notes and vitals signs, including pulse oximetry, reviewed.  Summary of this visit's results, reviewed by myself:  Labs:  Results for orders placed or performed during the hospital encounter of 08/13/14 (from the past 24 hour(s))  CBC with Differential     Status: Abnormal   Collection Time: 08/13/14  4:03 AM  Result Value Ref Range   WBC 20.8 (H) 4.0 - 10.5 K/uL   RBC 2.74 (L) 4.22 - 5.81 MIL/uL   Hemoglobin 8.5 (L) 13.0 - 17.0 g/dL   HCT 26.7 (L) 39.0 - 52.0 %   MCV 97.4 78.0 - 100.0 fL   MCH 31.0 26.0 - 34.0 pg   MCHC 31.8 30.0 - 36.0 g/dL   RDW 17.0 (H) 11.5 - 15.5 %   Platelets 195 150 - 400 K/uL   Neutrophils Relative % 87 (H) 43 - 77 %   Neutro Abs 18.2 (H) 1.7 - 7.7 K/uL   Lymphocytes Relative 3 (L) 12 - 46 %   Lymphs Abs 0.6 (L) 0.7 - 4.0 K/uL   Monocytes Relative 10 3 - 12 %   Monocytes Absolute 2.0 (H) 0.1 - 1.0 K/uL   Eosinophils Relative 0 0 - 5 %   Eosinophils Absolute 0.0 0.0 - 0.7 K/uL   Basophils Relative 0 0 - 1 %   Basophils Absolute 0.0 0.0 - 0.1 K/uL  Comprehensive metabolic panel     Status: Abnormal   Collection Time: 08/13/14  4:03 AM  Result Value Ref Range   Sodium 138 137 - 147 mEq/L   Potassium 5.1 3.7 - 5.3 mEq/L   Chloride 96 96 - 112 mEq/L   CO2 21 19 - 32 mEq/L   Glucose, Bld 186 (H) 70 - 99 mg/dL   BUN 55 (H) 6 - 23 mg/dL   Creatinine, Ser 9.42 (H) 0.50 - 1.35 mg/dL   Calcium 9.0 8.4 - 10.5 mg/dL   Total Protein 7.1 6.0 - 8.3 g/dL   Albumin 2.5 (L) 3.5 - 5.2 g/dL   AST 34 0 - 37 U/L   ALT 20 0 - 53 U/L   Alkaline Phosphatase  168 (H) 39 - 117 U/L   Total Bilirubin 1.5 (H) 0.3 - 1.2 mg/dL   GFR calc non Af Amer 5 (L) >90 mL/min   GFR calc Af Amer 6 (L) >90 mL/min  Protime-INR     Status: Abnormal   Collection Time: 08/13/14  4:03 AM  Result Value Ref Range    Prothrombin Time 42.7 (H) 11.6 - 15.2 seconds   INR 4.45 (H) 0.00 - 1.49  Troponin I     Status: None   Collection Time: 08/13/14  4:03 AM  Result Value Ref Range   Troponin I <0.30 <0.30 ng/mL  POC CBG, ED     Status: Abnormal   Collection Time: 08/13/14  4:07 AM  Result Value Ref Range   Glucose-Capillary 169 (H) 70 - 99 mg/dL   5:47 AM Patient feeling somewhat better at this time. He is afebrile and nontoxic appearing; the significance of his leukocytosis is not clear. His blood pressures, while somewhat low, have been stable. He is due for dialysis at 6 AM and we will send him there for further evaluation and dialysis.     Wynetta Fines, MD 08/13/14 [phone]

## 2014-08-13 NOTE — Discharge Instructions (Signed)
Proceed to dialysis as scheduled this morning.

## 2014-08-13 NOTE — ED Notes (Signed)
Pt has felt weak since yesterday. While pt was walking to restroom this am he felt weak and slide down wall to the floor.

## 2014-08-13 NOTE — ED Notes (Signed)
Pt c/o weakness, but denies any pain. Pt's last dialysis was Tuesday.

## 2014-08-13 NOTE — ED Provider Notes (Addendum)
CSN: [id number]     Arrival date & time 08/13/14  1242 History  This chart was scribed for Fredia Sorrow, MD by Tula Nakayama, ED Scribe. This patient was seen in room APA08/[facility]08 and the patient's care was started at 1:15 PM.    Chief Complaint  Patient presents with  . Abdominal Pain   Patient is a 63 y.o. male presenting with abdominal pain. The history is provided by the patient. No language interpreter was used.  Abdominal Pain Pain location:  Generalized Pain quality: squeezing   Pain radiates to:  Does not radiate Pain severity:  Moderate Onset quality:  Gradual Duration:  1 day Timing:  Constant Progression:  Unchanged Chronicity:  New Relieved by:  Nothing Associated symptoms: no chest pain, no chills, no cough, no diarrhea, no fever, no nausea, no shortness of breath, no sore throat and no vomiting     HPI Comments: [name] [name] is a 63 y.o. male who presents to the Emergency Department complaining of constant weakness in his abdomen and left leg and generalized abdominal tightness that started 1 day ago. His wife notes decreased appetite that started a couple of weeks ago and blood in his stool that occurred 2 days ago as associated symptoms. She reports that pt needs help getting out of his wheelchair, which is abnormal for him. Pt was in the ED this morning for the same symptoms and received lab work that showed leukocytosis. Pt takes Coumadin and receives dialysis on Tuesday, Thursday and Saturday. He received dialysis earlier today. Pt also had an ankle injury that occurred approximately 1 month ago. He denies recent dark stool as an associated symptom.   Past Medical History  Diagnosis Date  . UGI bleed 02/12/11    On anticoagulation; EGD w/snare polypectomy-multiple polypoid lesions antrum(benign), Chronic active gastritis (NEGATIVE H pylori)  . Anemia, chronic disease   . Sepsis due to enterococcus 02/11/11  . Atrial fibrillation     On coumadin  . Coronary  atherosclerosis of native coronary artery     Multivessel status post CABG  . Cardiomyopathy     LVEF 40-45% 01/2011  . Type 2 diabetes mellitus   . Essential hypertension, benign   . Hyperlipemia   . Gout   . S/P patent foramen ovale closure   . ESRD on hemodialysis     Started dialysis sometime around 2008-2009.  Gets HD in [place], [place] with Dr Hinda Lenis on a TTS schedule.  Runs 3.5 hours with dry wt 86.5 kg as of June 2015.  Had a L arm AVF which didn't work and has been using his R forearm AVF for several years.    . Hyperbilirubinemia 08/10/2013  . SBO (small bowel obstruction) 01/2014  . 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 Clostridium difficile colitis 01/2014  . Diverticulosis   . Internal hemorrhoids    Past Surgical History  Procedure Laterality Date  . Mitral valve replacement  01/05/2007    Bioprosthetic 73mm Edwards precordial tissue (Dr. Servando Snare)  . Coronary artery bypass graft  01/05/2007    LIMA-LAD, SVG-OM, seq. SVG-PDA, PLA-RCA  . Colonoscopy  06/23/2011    Dr. Oneida Alar: multiple sessile and pedunculated polyps, moderate diverticulosis, internal hemorrhoids, +ADENOMATOUS POLYPS, consider genetic testing, surveillance in October 2015   . Esophagogastroduodenoscopy  02/12/2011    CHRONIC ACTIVE GASTRITIS, NO H. pylori  . Transthoracic echocardiogram  11/2011    EF 25% w/ mod dilatation of LV & mod LVH; LA severely dilated; mod-severe dilation  of RV with mod-severe decrease in RV function; mild MR & TR  . Cardioversion  04/19/2007    Dr. Marella Chimes  . Transthoracic echocardiogram  02/14/2011    EF 40-45%, mod conc LVH; ventricular septum with motion showing abnormal function, dyssynergy, paradox; mild AS; mild-mod MR stenosis; LA severely dilated; aptrial septum bowed from L to R with increased LA pressure   . Amputation Right 07/14/2013    Procedure: AMPUTATION DIGIT;  Surgeon: [name], MD;  Location: [facility];  Service: General;  Laterality: Right;  . Amputation Right  08/08/2013    Procedure: AMPUTATION BELOW KNEE;  Surgeon: [name], MD;  Location: [facility];  Service: General;  Laterality: Right;  . Stump revision Right 09/19/2013    Procedure: STUMP REVISION;  Surgeon: Scherry Ran, MD;  Location: [facility];  Service: General;  Laterality: Right;  . Amputation Right 10/17/2013    Procedure: AMPUTATION ABOVE KNEE;  Surgeon: [name], MD;  Location: [facility];  Service: General;  Laterality: Right;  . Colonoscopy N/A 04/10/2014    Procedure: COLONOSCOPY;  Surgeon: [name], MD;  Location: [facility];  Service: Endoscopy;  Laterality: N/A;  60   Family History  Problem Relation Age of Onset  . Diabetes Mother   . Diabetes Father   . Diabetes Sister   . Colon cancer Neg Hx   . Colon polyps Neg Hx    History  Substance Use Topics  . Smoking status: Former Smoker -- 1.00 packs/day for 8 years    Types: Cigarettes    Start date: 04/27/1969    Quit date: 11/27/2006  . Smokeless tobacco: Never Used     Comment: quit about 5 yrs  . Alcohol Use: No    Review of Systems  Constitutional: Positive for appetite change. Negative for fever and chills.  HENT: Negative for congestion, rhinorrhea and sore throat.   Eyes: Negative for visual disturbance.  Respiratory: Negative for cough and shortness of breath.   Cardiovascular: Negative for chest pain and leg swelling.  Gastrointestinal: Positive for abdominal pain and blood in stool. Negative for nausea, vomiting and diarrhea.  Musculoskeletal: Negative for back pain.  Skin: Negative for rash.  Neurological: Positive for weakness. Negative for headaches.  Hematological: Bruises/bleeds easily.  Psychiatric/Behavioral: Negative for confusion.   Allergies  Bacitracin and Norvasc  Home Medications   Prior to Admission medications   Medication Sig Start Date End Date Taking? Authorizing Provider  albuterol (PROVENTIL) (2.5 MG/3ML) 0.083% nebulizer solution Take 3 mLs (2.5 mg total) by  nebulization every 2 (two) hours as needed for wheezing. 02/21/14  Yes Kinnie Feil, MD  allopurinol (ZYLOPRIM) 100 MG tablet Take 100 mg by mouth daily.  05/15/11  Yes Historical Provider, MD  amiodarone (PACERONE) 200 MG tablet Take 200 mg by mouth 2 (two) times daily.  07/20/14  Yes Historical Provider, MD  atorvastatin (LIPITOR) 20 MG tablet Take 1 tablet (20 mg total) by mouth every evening. 04/20/14  Yes Satira Sark, MD  B Complex-C-Zn-Folic Acid (DIALYVITE/ZINC PO) Take by mouth 3 (three) times a week. Patient has dialysis on Tuesdays, Thursdays, and Saturdays   Yes Historical Provider, MD  folic acid (FOLVITE) 1 MG tablet Take 1 mg by mouth daily.     Yes Historical Provider, MD  HYDROcodone-acetaminophen (NORCO/VICODIN) 5-325 MG per tablet Take 1 tablet by mouth every 6 (six) hours as needed for moderate pain. 08/06/14  Yes Carole Civil, MD  insulin  glargine (LANTUS) 100 UNIT/ML injection Inject 10 Units into the skin at bedtime.   Yes Historical Provider, MD  metoprolol (LOPRESSOR) 50 MG tablet Take 75 mg by mouth 2 (two) times daily. 07/04/14  Yes Historical Provider, MD  midodrine (PROAMATINE) 5 MG tablet Take 1 tablet (5 mg total) by mouth 3 (three) times daily before meals. 02/21/14  Yes Kinnie Feil, MD  pantoprazole (PROTONIX) 40 MG tablet Take 40 mg by mouth 2 (two) times daily.  05/25/11  Yes Historical Provider, MD  RENVELA 800 MG tablet Take 800 mg by mouth 3 (three) times daily with meals.  05/03/11  Yes Historical Provider, MD  SENSIPAR 30 MG tablet Take 30 mg by mouth daily. 01/22/14  Yes Historical Provider, MD  traMADol (ULTRAM) 50 MG tablet Take by mouth every 8 (eight) hours as needed for moderate pain.   Yes Historical Provider, MD  traZODone (DESYREL) 50 MG tablet Take 1 tablet (50 mg total) by mouth at bedtime. 08/13/13  Yes Nita Sells, MD  warfarin (COUMADIN) 2.5 MG tablet Take 1.25 tablets by mouth daily.  03/30/14  Yes Historical Provider, MD   metoprolol tartrate (LOPRESSOR) 25 MG tablet Take 0.5 tablets (12.5 mg total) by mouth 2 (two) times daily. Patient not taking: Reported on 08/13/2014 03/03/14   Allie Bossier, MD   BP 112/100 mmHg  Pulse 84  Temp(Src) 99.2 F (37.3 C) (Oral)  Resp 18  Ht 6\' 4"  (1.93 m)  Wt 200 lb (90.719 kg)  BMI 24.35 kg/m2  SpO2 93% Physical Exam  Constitutional: He is oriented to person, place, and time. He appears well-developed and well-nourished. No distress.  HENT:  Head: Normocephalic and atraumatic.  Mouth/Throat: Oropharynx is clear and moist.  Eyes: Conjunctivae and EOM are normal. Pupils are equal, round, and reactive to light.  Neck: Neck supple. No tracheal deviation present.  Cardiovascular: Normal rate and regular rhythm.   No murmur heard. Pulmonary/Chest: Effort normal and breath sounds normal. No respiratory distress. He has no wheezes. He has no rales.  Lungs clear bilaterally  Abdominal: Bowel sounds are normal. He exhibits no distension. There is no tenderness.  Musculoskeletal:  Left toes wiggling fine. On the right side, he has an above knee amputation. Right arm fistula with palpable thrill.   Neurological: He is alert and oriented to person, place, and time. No cranial nerve deficit. He exhibits normal muscle tone. Coordination normal.  Skin: Skin is warm and dry.  Psychiatric: He has a normal mood and affect. His behavior is normal.  Nursing note and vitals reviewed.   ED Course  Procedures (including critical care time) DIAGNOSTIC STUDIES: Oxygen Saturation is 92% on RA, low by my interpretation.    COORDINATION OF CARE: 1:22 PM Discussed treatment plan with pt which includes chest x-ray, CT Abdomen Pelvis, CT Head and lab work. Pt agreed to plan.  Labs Review Labs Reviewed  CBC WITH DIFFERENTIAL - Abnormal; Notable for the following:    WBC 14.7 (*)    RBC 2.63 (*)    Hemoglobin 8.1 (*)    HCT 25.9 (*)    RDW 16.8 (*)    Neutrophils Relative % 86 (*)     Neutro Abs 12.5 (*)    Lymphocytes Relative 4 (*)    Lymphs Abs 0.6 (*)    Monocytes Absolute 1.5 (*)    All other components within normal limits  BASIC METABOLIC PANEL - Abnormal; Notable for the following:    Sodium 135 (*)  Chloride 93 (*)    Glucose, Bld 247 (*)    BUN 24 (*)    Creatinine, Ser 4.79 (*)    GFR calc non Af Amer 12 (*)    GFR calc Af Amer 13 (*)    Anion gap 18 (*)    All other components within normal limits  PROTIME-INR - Abnormal; Notable for the following:    Prothrombin Time 41.7 (*)    INR 4.32 (*)    All other components within normal limits  CULTURE, BLOOD (ROUTINE X 2)  CULTURE, BLOOD (ROUTINE X 2)  TROPONIN I  TYPE AND SCREEN    Imaging Review Dg Chest 2 View  08/13/2014   CLINICAL DATA:  Weakness and abdominal pain.  Diabetes.  HTN.  EXAM: CHEST  2 VIEW  COMPARISON:  CT chest today  FINDINGS: Right pleural effusion, loculated on CT. Right lower lobe atelectasis or infiltrate. This could represent pneumonia and empyema. Correlate with fever and white count.  Cardiac enlargement with mild vascular congestion. No edema. Left lung clear.  Stent in the right upper lobe pulmonary artery of uncertain significance. This is unchanged from a CT of 12/06/2011.  IMPRESSION: Cardiac enlargement with vascular congestion.  Negative for edema.  Right pleural effusion and right lower lobe airspace disease. Possible pneumonia. Correlate with clinical symptoms.   Electronically Signed   By: Franchot Gallo M.D.   On: 08/13/2014 15:37   Ct Head Wo Contrast  08/13/2014   CLINICAL DATA:  Altered mental status and low-grade fever  EXAM: CT HEAD WITHOUT CONTRAST  TECHNIQUE: Contiguous axial images were obtained from the base of the skull through the vertex without intravenous contrast.  COMPARISON:  February 11, 2011  FINDINGS: Moderate diffuse atrophy is stable. Prominence of the cisterna magna is an anatomic variant. There is no demonstrable intracranial mass, acute hemorrhage,  extra-axial fluid collection, or midline shift. There is evidence of a prior infarct in the mid left parietal lobe, stable. There is evidence of a prior infarct in the mid portion of the left external capsule, stable. A small focus of calcification in this area may represent residua of prior hemorrhage. There is patchy small vessel disease throughout the centra semiovale bilaterally. There is no new gray-white compartment lesion. No acute appearing infarct appreciable. The bony calvarium appears intact. The mastoid air cells are clear.  IMPRESSION: Atrophy with prior infarcts and small vessel disease, stable. No acute hemorrhage. No mass or extra-axial fluid collection. No acute appearing infarct.   Electronically Signed   By: Lowella Grip M.D.   On: 08/13/2014 15:08   Ct Abdomen Pelvis W Contrast  08/13/2014   CLINICAL DATA:  Weakness.  Dialysis patient.  Diabetes.  EXAM: CT ABDOMEN AND PELVIS WITH CONTRAST  TECHNIQUE: Multidetector CT imaging of the abdomen and pelvis was performed using the standard protocol following bolus administration of intravenous contrast.  CONTRAST:  126mL OMNIPAQUE IOHEXOL 300 MG/ML  SOLN  COMPARISON:  CT abdomen pelvis 02/27/2014  FINDINGS: Loculated right lower pleural effusion measures 9 x 15 cm. This has enhancing wall. There was high-density in this fluid on the prior CT and this may be an organizing hemothorax. Empyema not excluded. If the patient has fever and white count, thoracentesis recommended. There is compressive atelectasis in the right lower lobe. Calcified granuloma left lower lobe unchanged. Small nodule left lower lobe not present but this area was not scanned today.  Cardiac enlargement with coronary artery calcification and CABG.  Liver and spleen normal.  Small calcified gallstones. Gallbladder is contracted and not thickened. Bile ducts nondilated. Pancreas and spleen are normal.  Small kidneys due to chronic renal failure. No renal mass or hydronephrosis.   Negative for bowel obstruction. No bowel thickening. Negative for diverticulitis. Normal appendix. Resolution of thickening of the cecum compared with the prior study which may be resolution of colitis. No underlying mass.  Advanced atherosclerotic disease without aortic aneurysm.  Chronic compression fracture L1 unchanged. Mild chronic fracture L4 unchanged. No acute spinal abnormality.  IMPRESSION: Large loculated right pleural effusion. This may represent contracting hemothorax based on the prior CT. If the patient has a fever or white count, thoracentesis recommended to exclude empyema.  Calcified gallstones.  Normal appendix.  Resolution of cecal thickening since the prior CT.   Electronically Signed   By: Franchot Gallo M.D.   On: 08/13/2014 15:18     EKG Interpretation None      MDM   Final diagnoses:  Weakness  Abdominal pain  HCAP (healthcare-associated pneumonia)  Anemia, unspecified anemia type     Patient's workup without any acute abdominal findings. However there is significant loculated right pleural effusion questionable pneumonia. May be development of empyema with his leukocytosis. In addition patient has borderline anemia may require blood transfusion. This could be the cause of his fatigue. Patient will be started on healthcare acquired pneumonia protocol antibiotics. Patient is probably require transfer to [facility] so he cannot consultation with CT surgery in case empyema has to be evaluated. Patient typed and screened if blood transfusion is required. Currently hemoglobin is still above 8 is not mandatory at this time. Patient nontoxic no acute distress. In addition patient is hypercoagulable he is on Coumadin and his INR is in the 4 range. No active bleeding currently.  Blood cultures done before the start of antibiotics. Patient not hypoxic no acute distress.  I personally performed the services described in this documentation, which was scribed in my presence. The recorded  information has been reviewed and is accurate.      Fredia Sorrow, MD 08/13/14 1715  Fredia Sorrow, MD 08/13/14 [phone]

## 2014-08-13 NOTE — Progress Notes (Signed)
[place] for Vancomycin & Cefepime Indication: rule out sepsis  Allergies  Allergen Reactions  . Bacitracin Other (See Comments)    unknown  . Norvasc [Amlodipine Besylate] Other (See Comments)    unknown    Patient Measurements: Height: 6\' 4"  (193 cm) Weight: 200 lb (90.719 kg) IBW/kg (Calculated) : 86.8  Vital Signs: Temp: 99.2 F (37.3 C) (12/17 1253) Temp Source: Oral (12/17 1253) BP: 98/51 mmHg (12/17 1930) Pulse Rate: 61 (12/17 1930) Intake/Output from previous day:   Intake/Output from this shift:    Labs:  Recent Labs  08/13/14 0403 08/13/14 1402  WBC 20.8* 14.7*  HGB 8.5* 8.1*  PLT 195 198  CREATININE 9.42* 4.79*   Estimated Creatinine Clearance: 18.9 mL/min (by C-G formula based on Cr of 4.79). No results for input(s): VANCOTROUGH, VANCOPEAK, VANCORANDOM, GENTTROUGH, GENTPEAK, GENTRANDOM, TOBRATROUGH, TOBRAPEAK, TOBRARND, AMIKACINPEAK, AMIKACINTROU, AMIKACIN in the last 72 hours.   Microbiology: No results found for this or any previous visit (from the past 720 hour(s)).  Anti-infectives    Start     Dose/Rate Route Frequency Ordered Stop   08/13/14 1730  vancomycin (VANCOCIN) 1,500 mg in sodium chloride 0.9 % 500 mL IVPB     1,500 mg250 mL/hr over 120 Minutes Intravenous  Once 08/13/14 1723 08/13/14 2057   08/13/14 1715  ceFEPIme (MAXIPIME) 1 g in dextrose 5 % 50 mL IVPB     1 g100 mL/hr over 30 Minutes Intravenous  Once 08/13/14 1710 08/13/14 1845      Assessment: 89 yoM who presented with abdominal pain.  He has elevated WBC and low grade fever.  Abdominal CT + right pleural effusion and thoracentesis is pending to r/o empyema.  CXR + PNA.  He has ESRD requiring HD on TTS as outpatient.  Renal consult pending.   Vancomycin 12/17>> Cefepime 12/17>>  Goal of Therapy:  Pre-HD Vancomycin level 15-80mcg/ml  Plan:  Vancomycin 1500mg  IV loading dose x1 then 1000mg  IV qHD (TTS) Cefepime 2gm IV total loading dose  then 2gm IV qHD Check pre-HD Vancomycin level at steady state F/U clinical course & cx data  Biagio Borg 08/13/2014,9:30 PM

## 2014-08-13 NOTE — ED Notes (Signed)
Assisted pt to bedside commode.

## 2014-08-13 NOTE — ED Notes (Signed)
Called [facility] with Bed Assignment.  They will send a truck.  "They will Leave [name] [name] around 1915".

## 2014-08-14 ENCOUNTER — Encounter (HOSPITAL_COMMUNITY): Payer: Self-pay | Admitting: General Practice

## 2014-08-14 DIAGNOSIS — Z7901 Long term (current) use of anticoagulants: Secondary | ICD-10-CM

## 2014-08-14 DIAGNOSIS — J90 Pleural effusion, not elsewhere classified: Secondary | ICD-10-CM

## 2014-08-14 DIAGNOSIS — I482 Chronic atrial fibrillation: Secondary | ICD-10-CM

## 2014-08-14 LAB — MRSA PCR SCREENING: MRSA BY PCR: POSITIVE — AB

## 2014-08-14 LAB — GLUCOSE, CAPILLARY
GLUCOSE-CAPILLARY: 174 mg/dL — AB (ref 70–99)
GLUCOSE-CAPILLARY: 207 mg/dL — AB (ref 70–99)
Glucose-Capillary: 176 mg/dL — ABNORMAL HIGH (ref 70–99)
Glucose-Capillary: 151 mg/dL — ABNORMAL HIGH (ref 70–99)

## 2014-08-14 MED ORDER — DIGOXIN 0.25 MG/ML IJ SOLN: 0.2500 mg | Freq: Once | INTRAMUSCULAR | Status: DC

## 2014-08-14 MED ORDER — AMIODARONE HCL IN DEXTROSE 360-4.14 MG/200ML-% IV SOLN
30.0000 mg/h | INTRAVENOUS | Status: DC
  Administered 2014-08-14 – 2014-08-19 (×10): 30 mg/h via INTRAVENOUS
  Filled 2014-08-14 (×22): qty 200

## 2014-08-14 MED ORDER — AMIODARONE HCL IN DEXTROSE 360-4.14 MG/200ML-% IV SOLN
60.0000 mg/h | INTRAVENOUS | Status: AC
  Administered 2014-08-14: 60 mg/h via INTRAVENOUS
  Filled 2014-08-14 (×2): qty 200

## 2014-08-14 MED ORDER — AMIODARONE IV BOLUS ONLY 150 MG/100ML
150.0000 mg | Freq: Once | INTRAVENOUS | Status: AC
  Administered 2014-08-14: 150 mg via INTRAVENOUS
  Filled 2014-08-14: qty 100

## 2014-08-14 NOTE — Consult Note (Signed)
Reason for Consult: Right pleural effusion Referring Physician: Dr. Ortiz  [name] [name] is an 63 y.o. male.  HPI: 63 yo man admitted for right flank pain.  [name] [name] is a 63 yo man with a complicated medical history. His PMH is significant for ESRD, type II DM, CABG/ MVR in 2008 by EBG, CHF, cardiomyopathy, atrial fibrillation, GI bleeds, SBP, pneumatosis coli, c diff colitis among others.  He says he began having right flank pain about 4 days ago. It waxes and wanes. He says it came on gradually. His appetite has been poor. His wife noted blood in stools. He denies cough, shortness of breath and fever. However, his WBC was elevated at 20K on admission. He was started on vanco mycin and cefepime empirically.  He had a CT in the ED which showed a loculated right pleural effusion. This is smaller than it was in July.  He is not having pain at present.  Past Medical History  Diagnosis Date  . UGI bleed 02/12/11    On anticoagulation; EGD w/snare polypectomy-multiple polypoid lesions antrum(benign), Chronic active gastritis (NEGATIVE H pylori)  . Anemia, chronic disease   . Sepsis due to enterococcus 02/11/11  . Atrial fibrillation     On coumadin  . Coronary atherosclerosis of native coronary artery     Multivessel status post CABG  . Cardiomyopathy     LVEF 40-45% 01/2011  . Type 2 diabetes mellitus   . Essential hypertension, benign   . Hyperlipemia   . Gout   . S/P patent foramen ovale closure   . Hyperbilirubinemia 08/10/2013  . SBO (small bowel obstruction) 01/2014  . 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 Clostridium difficile colitis 01/2014  . Diverticulosis   . Internal hemorrhoids   . ESRD on hemodialysis     Started dialysis sometime around 2008-2009.  Gets HD in [place], [place] with Dr Befakadu on a TTS schedule.  Runs 3.5 hours with dry wt 86.5 kg as of June 2015.  Had a L arm AVF which didn't work and has been using his R forearm AVF for several years.      Past Surgical History   Procedure Laterality Date  . Mitral valve replacement  01/05/2007    Bioprosthetic 31mm Edwards precordial tissue (Dr. Gerhardt)  . Coronary artery bypass graft  01/05/2007    LIMA-LAD, SVG-OM, seq. SVG-PDA, PLA-RCA  . Colonoscopy  06/23/2011    Dr. Fields: multiple sessile and pedunculated polyps, moderate diverticulosis, internal hemorrhoids, +ADENOMATOUS POLYPS, consider genetic testing, surveillance in October 2015   . Esophagogastroduodenoscopy  02/12/2011    CHRONIC ACTIVE GASTRITIS, NO H. pylori  . Transthoracic echocardiogram  11/2011    EF 25% w/ mod dilatation of LV & mod LVH; LA severely dilated; mod-severe dilation of RV with mod-severe decrease in RV function; mild MR & TR  . Cardioversion  04/19/2007    Dr. R. Weintraub  . Transthoracic echocardiogram  02/14/2011    EF 40-45%, mod conc LVH; ventricular septum with motion showing abnormal function, dyssynergy, paradox; mild AS; mild-mod MR stenosis; LA severely dilated; aptrial septum bowed from L to R with increased LA pressure   . Amputation Right 07/14/2013    Procedure: AMPUTATION DIGIT;  Surgeon: Mark A [name], MD;  Location: [facility];  Service: General;  Laterality: Right;  . Amputation Right 08/08/2013    Procedure: AMPUTATION BELOW KNEE;  Surgeon: Mark A [name], MD;  Location: [facility];  Service: General;  Laterality: Right;  .   Stump revision Right 09/19/2013    Procedure: STUMP REVISION;  Surgeon: Scherry Ran, MD;  Location: [facility];  Service: General;  Laterality: Right;  . Amputation Right 10/17/2013    Procedure: AMPUTATION ABOVE KNEE;  Surgeon: [name], MD;  Location: [facility];  Service: General;  Laterality: Right;  . Colonoscopy N/A 04/10/2014    Procedure: COLONOSCOPY;  Surgeon: [name], MD;  Location: [facility];  Service: Endoscopy;  Laterality: N/A;  76    Family History  Problem Relation Age of Onset  . Diabetes Mother   . Diabetes Father   . Diabetes Sister   . Colon cancer Neg Hx   .  Colon polyps Neg Hx     Social History:  reports that he quit smoking about 7 years ago. His smoking use included Cigarettes. He started smoking about 45 years ago. He has a 8 pack-year smoking history. He has never used smokeless tobacco. He reports that he does not drink alcohol or use illicit drugs.  Allergies:  Allergies  Allergen Reactions  . Bacitracin Other (See Comments)    unknown  . Norvasc [Amlodipine Besylate] Other (See Comments)    unknown    Medications:  Scheduled: . allopurinol  100 mg Oral Daily  . amiodarone  200 mg Oral BID  . atorvastatin  20 mg Oral QPM  . [START ON 08/15/2014] ceFEPime (MAXIPIME) IV  2 g Intravenous Q T,Th,Sa-HD  . cinacalcet  30 mg Oral Q breakfast  . folic acid  1 mg Oral Daily  . insulin aspart  0-5 Units Subcutaneous QHS  . insulin aspart  0-9 Units Subcutaneous TID WC  . insulin glargine  10 Units Subcutaneous QHS  . metoprolol  75 mg Oral BID  . midodrine  5 mg Oral TID AC  . pantoprazole  40 mg Oral BID  . phytonadione (VITAMIN K) IV  5 mg Intravenous Once  . sevelamer carbonate  800 mg Oral TID WC  . sodium chloride  3 mL Intravenous Q12H  . sodium chloride  3 mL Intravenous Q12H  . traZODone  50 mg Oral QHS  . [START ON 08/15/2014] vancomycin  1,000 mg Intravenous Q T,Th,Sa-HD    Results for orders placed or performed during the hospital encounter of 08/13/14 (from the past 48 hour(s))  CBC with Differential     Status: Abnormal   Collection Time: 08/13/14  2:02 PM  Result Value Ref Range   WBC 14.7 (H) 4.0 - 10.5 K/uL   RBC 2.63 (L) 4.22 - 5.81 MIL/uL   Hemoglobin 8.1 (L) 13.0 - 17.0 g/dL   HCT 25.9 (L) 39.0 - 52.0 %   MCV 98.5 78.0 - 100.0 fL   MCH 30.8 26.0 - 34.0 pg   MCHC 31.3 30.0 - 36.0 g/dL   RDW 16.8 (H) 11.5 - 15.5 %   Platelets 198 150 - 400 K/uL   Neutrophils Relative % 86 (H) 43 - 77 %   Neutro Abs 12.5 (H) 1.7 - 7.7 K/uL   Lymphocytes Relative 4 (L) 12 - 46 %   Lymphs Abs 0.6 (L) 0.7 - 4.0 K/uL    Monocytes Relative 10 3 - 12 %   Monocytes Absolute 1.5 (H) 0.1 - 1.0 K/uL   Eosinophils Relative 0 0 - 5 %   Eosinophils Absolute 0.0 0.0 - 0.7 K/uL   Basophils Relative 0 0 - 1 %   Basophils Absolute 0.0 0.0 - 0.1 K/uL  Basic metabolic panel  Status: Abnormal   Collection Time: 08/13/14  2:02 PM  Result Value Ref Range   Sodium 135 (L) 137 - 147 mEq/L   Potassium 3.8 3.7 - 5.3 mEq/L    Comment: DELTA CHECK NOTED   Chloride 93 (L) 96 - 112 mEq/L   CO2 24 19 - 32 mEq/L   Glucose, Bld 247 (H) 70 - 99 mg/dL   BUN 24 (H) 6 - 23 mg/dL    Comment: DELTA CHECK NOTED   Creatinine, Ser 4.79 (H) 0.50 - 1.35 mg/dL    Comment: DELTA CHECK NOTED   Calcium 8.6 8.4 - 10.5 mg/dL   GFR calc non Af Amer 12 (L) >90 mL/min   GFR calc Af Amer 13 (L) >90 mL/min    Comment: (NOTE) The eGFR has been calculated using the CKD EPI equation. This calculation has not been validated in all clinical situations. eGFR's persistently <90 mL/min signify possible Chronic Kidney Disease.    Anion gap 18 (H) 5 - 15  Protime-INR     Status: Abnormal   Collection Time: 08/13/14  2:02 PM  Result Value Ref Range   Prothrombin Time 41.7 (H) 11.6 - 15.2 seconds   INR 4.32 (H) 0.00 - 1.49  Troponin I     Status: None   Collection Time: 08/13/14  2:02 PM  Result Value Ref Range   Troponin I <0.30 <0.30 ng/mL    Comment:        Due to the release kinetics of cTnI, a negative result within the first hours of the onset of symptoms does not rule out myocardial infarction with certainty. If myocardial infarction is still suspected, repeat the test at appropriate intervals.   TSH     Status: None   Collection Time: 08/13/14  2:02 PM  Result Value Ref Range   TSH 1.210 0.350 - 4.500 uIU/mL    Comment: Performed at [facility]  Type and screen     Status: None   Collection Time: 08/13/14  5:45 PM  Result Value Ref Range   ABO/RH(D) O POS    Antibody Screen NEG    Sample Expiration 08/16/2014   CBG  monitoring, ED     Status: Abnormal   Collection Time: 08/13/14  8:26 PM  Result Value Ref Range   Glucose-Capillary 169 (H) 70 - 99 mg/dL   Comment 1 Notify RN   Glucose, capillary     Status: Abnormal   Collection Time: 08/13/14 10:40 PM  Result Value Ref Range   Glucose-Capillary 159 (H) 70 - 99 mg/dL  MRSA PCR Screening     Status: Abnormal   Collection Time: 08/14/14  2:05 AM  Result Value Ref Range   MRSA by PCR POSITIVE (A) NEGATIVE    Comment:        The GeneXpert MRSA Assay (FDA approved for NASAL specimens only), is one component of a comprehensive MRSA colonization surveillance program. It is not intended to diagnose MRSA infection nor to guide or monitor treatment for MRSA infections. RESULT CALLED TO, READ BACK BY AND VERIFIED WITH: D RICE,RN [id number] WILDERK   Glucose, capillary     Status: Abnormal   Collection Time: 08/14/14  6:12 AM  Result Value Ref Range   Glucose-Capillary 174 (H) 70 - 99 mg/dL  Glucose, capillary     Status: Abnormal   Collection Time: 08/14/14 11:56 AM  Result Value Ref Range   Glucose-Capillary 207 (H) 70 - 99 mg/dL  Glucose, capillary  Status: Abnormal   Collection Time: 08/14/14  4:54 PM  Result Value Ref Range   Glucose-Capillary 176 (H) 70 - 99 mg/dL   Comment 1 Capillary Sample     Dg Chest 2 View  08/13/2014   CLINICAL DATA:  Weakness and abdominal pain.  Diabetes.  HTN.  EXAM: CHEST  2 VIEW  COMPARISON:  CT chest today  FINDINGS: Right pleural effusion, loculated on CT. Right lower lobe atelectasis or infiltrate. This could represent pneumonia and empyema. Correlate with fever and white count.  Cardiac enlargement with mild vascular congestion. No edema. Left lung clear.  Stent in the right upper lobe pulmonary artery of uncertain significance. This is unchanged from a CT of 12/06/2011.  IMPRESSION: Cardiac enlargement with vascular congestion.  Negative for edema.  Right pleural effusion and right lower lobe airspace  disease. Possible pneumonia. Correlate with clinical symptoms.   Electronically Signed   By: Charles  Clark M.D.   On: 08/13/2014 15:37   Ct Head Wo Contrast  08/13/2014   CLINICAL DATA:  Altered mental status and low-grade fever  EXAM: CT HEAD WITHOUT CONTRAST  TECHNIQUE: Contiguous axial images were obtained from the base of the skull through the vertex without intravenous contrast.  COMPARISON:  February 11, 2011  FINDINGS: Moderate diffuse atrophy is stable. Prominence of the cisterna magna is an anatomic variant. There is no demonstrable intracranial mass, acute hemorrhage, extra-axial fluid collection, or midline shift. There is evidence of a prior infarct in the mid left parietal lobe, stable. There is evidence of a prior infarct in the mid portion of the left external capsule, stable. A small focus of calcification in this area may represent residua of prior hemorrhage. There is patchy small vessel disease throughout the centra semiovale bilaterally. There is no new gray-white compartment lesion. No acute appearing infarct appreciable. The bony calvarium appears intact. The mastoid air cells are clear.  IMPRESSION: Atrophy with prior infarcts and small vessel disease, stable. No acute hemorrhage. No mass or extra-axial fluid collection. No acute appearing infarct.   Electronically Signed   By: William  Woodruff M.D.   On: 08/13/2014 15:08   Ct Abdomen Pelvis W Contrast  08/13/2014   CLINICAL DATA:  Weakness.  Dialysis patient.  Diabetes.  EXAM: CT ABDOMEN AND PELVIS WITH CONTRAST  TECHNIQUE: Multidetector CT imaging of the abdomen and pelvis was performed using the standard protocol following bolus administration of intravenous contrast.  CONTRAST:  100mL OMNIPAQUE IOHEXOL 300 MG/ML  SOLN  COMPARISON:  CT abdomen pelvis 02/27/2014  FINDINGS: Loculated right lower pleural effusion measures 9 x 15 cm. This has enhancing wall. There was high-density in this fluid on the prior CT and this may be an  organizing hemothorax. Empyema not excluded. If the patient has fever and white count, thoracentesis recommended. There is compressive atelectasis in the right lower lobe. Calcified granuloma left lower lobe unchanged. Small nodule left lower lobe not present but this area was not scanned today.  Cardiac enlargement with coronary artery calcification and CABG.  Liver and spleen normal. Small calcified gallstones. Gallbladder is contracted and not thickened. Bile ducts nondilated. Pancreas and spleen are normal.  Small kidneys due to chronic renal failure. No renal mass or hydronephrosis.  Negative for bowel obstruction. No bowel thickening. Negative for diverticulitis. Normal appendix. Resolution of thickening of the cecum compared with the prior study which may be resolution of colitis. No underlying mass.  Advanced atherosclerotic disease without aortic aneurysm.  Chronic compression fracture L1 unchanged. Mild   chronic fracture L4 unchanged. No acute spinal abnormality.  IMPRESSION: Large loculated right pleural effusion. This may represent contracting hemothorax based on the prior CT. If the patient has a fever or white count, thoracentesis recommended to exclude empyema.  Calcified gallstones.  Normal appendix.  Resolution of cecal thickening since the prior CT.   Electronically Signed   By: Charles  Clark M.D.   On: 08/13/2014 15:18    Review of Systems  Constitutional: Positive for malaise/fatigue. Negative for fever and chills.  Gastrointestinal: Positive for nausea, vomiting and abdominal pain (right flank).  Genitourinary:       Renal failure  Endo/Heme/Allergies: Bruises/bleeds easily.  All other systems reviewed and are negative.  Blood pressure 105/55, pulse 77, temperature 99.4 F (37.4 C), temperature source Oral, resp. rate 17, height 6' 4" (1.93 m), weight 200 lb 9.9 oz (91 kg), SpO2 100 %. Physical Exam  Vitals reviewed. Constitutional: He is oriented to person, place, and time. He  appears well-developed and well-nourished. No distress.  HENT:  Head: Normocephalic and atraumatic.  Eyes: EOM are normal. Pupils are equal, round, and reactive to light.  Neck: Neck supple. No thyromegaly present.  Cardiovascular: Normal rate, regular rhythm and normal heart sounds.   No murmur heard. Respiratory: Effort normal.  Diminished BS right base  GI: Soft. There is no tenderness.  Lymphadenopathy:    He has no cervical adenopathy.  Neurological: He is alert and oriented to person, place, and time. No cranial nerve deficit.  Skin: Skin is warm and dry.    Assessment/Plan: 63 yo man with multiple medical problems. He has been having right flank pain and has an elevated WBC. He has a loculated right pleural effusion. It does line up well with his pain, but I'm not sure it explains his WBC. His effusion is actually smaller than it was 6 months ago.   This is likely an old hemothorax. The current CT findings would be consistent with the natural evolution. There is a possibility it is secondarily infected.  I recommend correcting INR then doing a thoracentesis to see if there is any evidence the effusion is secondarily infected. If so would need to consider VATS vs percutaneous drainage  HENDRICKSON,STEVEN C 08/14/2014, 7:32 PM      

## 2014-08-14 NOTE — Progress Notes (Signed)
Per EMS, patient heart rate was 140's, blood pressure was 90's/ 50's. MD notified. Normal saline bolus 500 ml done. Amiodarone bolus and continuous infusion done. Heart rate remained in the 130's and BP did not change much. MD order received to transfer patient.

## 2014-08-14 NOTE — Progress Notes (Signed)
[facility] PROGRESS NOTE   Assessment/Plan: Pleural effusion, right/ Loculated pleural effusion/HCAP: - hold coumadin allow diet. Follow PT/INR - consulted CT surgery for loculated effusion. - started empirically on vanc and cefepime.  ESRD (end stage renal disease) on dialysis: - consult renal. - usual HD TTS  DM type 2 causing ESRD - Lantus insulin and sliding scale of insulin. - cbg's ACHS.  Atrial fibrillation/[place] term current use of anticoagulant therapy: - rate controlled. - hold coumaidn.    Code Status: full Family Communication: none  Disposition Plan: inpatient   Consultants:  Renal  CT Surgery  Procedures:  Ct abd and pelvis  Antibiotics:  vanc and cefepime 12.17.2015.  HPI/Subjective: abd pain improved.  Objective: Filed Vitals:   08/14/14 0139 08/14/14 0356 08/14/14 0527 08/14/14 0815  BP: 83/43 93/64 92/53  94/60  Pulse: 102 130 124 124  Temp:   98.9 F (37.2 C)   TempSrc:   Oral   Resp: 20 20 18 18   Height:   6\' 4"  (1.93 m)   Weight:   91 kg (200 lb 9.9 oz)   SpO2:   100% 98%   No intake or output data in the 24 hours ending 08/14/14 0943 Filed Weights   08/13/14 1253 08/14/14 0527  Weight: 90.719 kg (200 lb) 91 kg (200 lb 9.9 oz)    Exam:  General: Alert, awake, oriented x3, in no acute distress.  HEENT: No bruits, no goiter.  Heart: Regular rate and rhythm. Lungs: Good air movement,  Abdomen: Soft, nontender, nondistended, positive bowel sounds.     Data Reviewed: Basic Metabolic Panel:  Recent Labs Lab 08/13/14 0403 08/13/14 1402  NA 138 135*  K 5.1 3.8  CL 96 93*  CO2 21 24  GLUCOSE 186* 247*  BUN 55* 24*  CREATININE 9.42* 4.79*  CALCIUM 9.0 8.6   Liver Function Tests:  Recent Labs Lab 08/13/14 0403  AST 34  ALT 20  ALKPHOS 168*  BILITOT 1.5*  PROT 7.1  ALBUMIN 2.5*   No results for input(s): LIPASE, AMYLASE in the last 168 hours. No results for input(s): AMMONIA in the last 168  hours. CBC:  Recent Labs Lab 08/13/14 0403 08/13/14 1402  WBC 20.8* 14.7*  NEUTROABS 18.2* 12.5*  HGB 8.5* 8.1*  HCT 26.7* 25.9*  MCV 97.4 98.5  PLT 195 198   Cardiac Enzymes:  Recent Labs Lab 08/13/14 0403 08/13/14 1402  TROPONINI <0.30 <0.30   BNP (last 3 results) No results for input(s): PROBNP in the last 8760 hours. CBG:  Recent Labs Lab 08/13/14 0407 08/13/14 2026 08/13/14 2240 08/14/14 0612  GLUCAP 169* 169* 159* 174*    Recent Results (from the past 240 hour(s))  MRSA PCR Screening     Status: Abnormal   Collection Time: 08/14/14  2:05 AM  Result Value Ref Range Status   MRSA by PCR POSITIVE (A) NEGATIVE Final    Comment:        The GeneXpert MRSA Assay (FDA approved for NASAL specimens only), is one component of a comprehensive MRSA colonization surveillance program. It is not intended to diagnose MRSA infection nor to guide or monitor treatment for MRSA infections. RESULT CALLED TO, READ BACK BY AND VERIFIED WITH: D RICE,RN [id number]      Studies: Dg Chest 2 View  08/13/2014   CLINICAL DATA:  Weakness and abdominal pain.  Diabetes.  HTN.  EXAM: CHEST  2 VIEW  COMPARISON:  CT chest today  FINDINGS: Right pleural effusion,  loculated on CT. Right lower lobe atelectasis or infiltrate. This could represent pneumonia and empyema. Correlate with fever and white count.  Cardiac enlargement with mild vascular congestion. No edema. Left lung clear.  Stent in the right upper lobe pulmonary artery of uncertain significance. This is unchanged from a CT of 12/06/2011.  IMPRESSION: Cardiac enlargement with vascular congestion.  Negative for edema.  Right pleural effusion and right lower lobe airspace disease. Possible pneumonia. Correlate with clinical symptoms.   Electronically Signed   By: Franchot Gallo M.D.   On: 08/13/2014 15:37   Ct Head Wo Contrast  08/13/2014   CLINICAL DATA:  Altered mental status and low-grade fever  EXAM: CT HEAD WITHOUT  CONTRAST  TECHNIQUE: Contiguous axial images were obtained from the base of the skull through the vertex without intravenous contrast.  COMPARISON:  February 11, 2011  FINDINGS: Moderate diffuse atrophy is stable. Prominence of the cisterna magna is an anatomic variant. There is no demonstrable intracranial mass, acute hemorrhage, extra-axial fluid collection, or midline shift. There is evidence of a prior infarct in the mid left parietal lobe, stable. There is evidence of a prior infarct in the mid portion of the left external capsule, stable. A small focus of calcification in this area may represent residua of prior hemorrhage. There is patchy small vessel disease throughout the centra semiovale bilaterally. There is no new gray-white compartment lesion. No acute appearing infarct appreciable. The bony calvarium appears intact. The mastoid air cells are clear.  IMPRESSION: Atrophy with prior infarcts and small vessel disease, stable. No acute hemorrhage. No mass or extra-axial fluid collection. No acute appearing infarct.   Electronically Signed   By: Lowella Grip M.D.   On: 08/13/2014 15:08   Ct Abdomen Pelvis W Contrast  08/13/2014   CLINICAL DATA:  Weakness.  Dialysis patient.  Diabetes.  EXAM: CT ABDOMEN AND PELVIS WITH CONTRAST  TECHNIQUE: Multidetector CT imaging of the abdomen and pelvis was performed using the standard protocol following bolus administration of intravenous contrast.  CONTRAST:  133mL OMNIPAQUE IOHEXOL 300 MG/ML  SOLN  COMPARISON:  CT abdomen pelvis 02/27/2014  FINDINGS: Loculated right lower pleural effusion measures 9 x 15 cm. This has enhancing wall. There was high-density in this fluid on the prior CT and this may be an organizing hemothorax. Empyema not excluded. If the patient has fever and white count, thoracentesis recommended. There is compressive atelectasis in the right lower lobe. Calcified granuloma left lower lobe unchanged. Small nodule left lower lobe not present but  this area was not scanned today.  Cardiac enlargement with coronary artery calcification and CABG.  Liver and spleen normal. Small calcified gallstones. Gallbladder is contracted and not thickened. Bile ducts nondilated. Pancreas and spleen are normal.  Small kidneys due to chronic renal failure. No renal mass or hydronephrosis.  Negative for bowel obstruction. No bowel thickening. Negative for diverticulitis. Normal appendix. Resolution of thickening of the cecum compared with the prior study which may be resolution of colitis. No underlying mass.  Advanced atherosclerotic disease without aortic aneurysm.  Chronic compression fracture L1 unchanged. Mild chronic fracture L4 unchanged. No acute spinal abnormality.  IMPRESSION: Large loculated right pleural effusion. This may represent contracting hemothorax based on the prior CT. If the patient has a fever or white count, thoracentesis recommended to exclude empyema.  Calcified gallstones.  Normal appendix.  Resolution of cecal thickening since the prior CT.   Electronically Signed   By: Franchot Gallo M.D.   On: 08/13/2014 15:18  Scheduled Meds: . allopurinol  100 mg Oral Daily  . amiodarone  200 mg Oral BID  . atorvastatin  20 mg Oral QPM  . [START ON 08/15/2014] ceFEPime (MAXIPIME) IV  2 g Intravenous Q T,Th,Sa-HD  . cinacalcet  30 mg Oral Q breakfast  . folic acid  1 mg Oral Daily  . insulin aspart  0-5 Units Subcutaneous QHS  . insulin aspart  0-9 Units Subcutaneous TID WC  . insulin glargine  10 Units Subcutaneous QHS  . metoprolol  75 mg Oral BID  . midodrine  5 mg Oral TID AC  . pantoprazole  40 mg Oral BID  . phytonadione (VITAMIN K) IV  5 mg Intravenous Once  . sevelamer carbonate  800 mg Oral TID WC  . sodium chloride  3 mL Intravenous Q12H  . sodium chloride  3 mL Intravenous Q12H  . traZODone  50 mg Oral QHS  . [START ON 08/15/2014] vancomycin  1,000 mg Intravenous Q T,Th,Sa-HD   Continuous Infusions: . amiodarone 60 mg/hr  (08/14/14 0518)  . amiodarone       [name] [name]  [facility] Pager [phone]. If 8PM-8AM, please contact night-coverage at [email or web address], password [email or web address] 08/14/2014, 9:43 AM  LOS: 1 day

## 2014-08-14 NOTE — Progress Notes (Signed)
Report given to [facility] RN and patient transferred per order. VSS, HR currently 120-130's BP 94/62. Neuro status intact. Patient and belongings taken in bed to [place] on monitor.

## 2014-08-14 NOTE — Consult Note (Signed)
[facility] Renal Consultation Note  Indication for Consultation:  Management of ESRD/hemodialysis; anemia, hypertension/volume and secondary hyperparathyroidism  HPI: [name] [name] is a 63 y.o. male with a history of CAD s/p CABG x 4 in 2008, mitral valve replacement in 2008, PVD s/p right AKA in 08/2013, atrial fibrillation on Coumadin, Diabetes Type 2, and ESRD on dialysis at [facility] in [place] who presented to [facility] yesterday with one day of worsening right-side abdominal pain and weakness. He denied any nausea, vomiting, fever, chills, or dyspnea, but reports decreased appetite and no bowel movement in three days.  CT of the abdomen showed a large loculated right pleural effusion, and he was transferred to [facility] with possible empyema and CT surgical consultation. He is currently in no distress and has no complaints.  Chart review: Apr 2008-- hx MV prolapse, MR presented w CHF symptoms. New afib and low EF 25%. Felt to need valve replacement and heart cath showed severe 3V CAD so pt had CABG, bioprosthetic MV replacement and closure of PFO. D/C"d home on coumadin, amio, avapro, others. Also DM, HTN, HL, gout, NSVT June 2012-- UGIB due to multiple ulcerated polypoid lesions in stomach, severe anemia got 3u prbc's. Polyps removed. Enterococcus bacteremia rx with vanc/gent. CAF, CM EF 45%, ESRD on HD TTW, DM2 and HTN Nov 2014-- osteo of toe on R foot, ESRD, afib, CM, hx cabg, hx MVR, DM2 > Rx with OP abx at HD Dec 2014-- presented septic w low BP and infected R foot, underwent R BKA and recovered well. CAF, ESRD, CABG, CM Jan 2015-- traumatic dehiscence of R BKA wound, closed by surgery. ESRD, anemia requiring prbc's, DM2, hx cabg/MVR, CM EF 45%, afib on coumadin Feb 2015-- R AKA done for breakdown and poor healing of BK stump, also esrd, afib on coumadin, CM, hx cabg/ mvr Jun 2015-- SBO, prob asp PNA, CDif rx with flagyl, SIRS , CAF, cabg/ MVR  Past Medical History   Diagnosis Date  . UGI bleed 02/12/11    On anticoagulation; EGD w/snare polypectomy-multiple polypoid lesions antrum(benign), Chronic active gastritis (NEGATIVE H pylori)  . Anemia, chronic disease   . Sepsis due to enterococcus 02/11/11  . Atrial fibrillation     On coumadin  . Coronary atherosclerosis of native coronary artery     Multivessel status post CABG  . Cardiomyopathy     LVEF 40-45% 01/2011  . Type 2 diabetes mellitus   . Essential hypertension, benign   . Hyperlipemia   . Gout   . S/P patent foramen ovale closure   . Hyperbilirubinemia 08/10/2013  . SBO (small bowel obstruction) 01/2014  . 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 Clostridium difficile colitis 01/2014  . Diverticulosis   . Internal hemorrhoids   . ESRD on hemodialysis     Started dialysis sometime around 2008-2009.  Gets HD in [place], [place] with Dr Hinda Lenis on a TTS schedule.  Runs 3.5 hours with dry wt 86.5 kg as of June 2015.  Had a L arm AVF which didn't work and has been using his R forearm AVF for several years.     Past Surgical History  Procedure Laterality Date  . Mitral valve replacement  01/05/2007    Bioprosthetic 32m Edwards precordial tissue (Dr. GServando Snare  . Coronary artery bypass graft  01/05/2007    LIMA-LAD, SVG-OM, seq. SVG-PDA, PLA-RCA  . Colonoscopy  06/23/2011    Dr. FOneida Alar multiple sessile and pedunculated polyps, moderate diverticulosis, internal hemorrhoids, +ADENOMATOUS  POLYPS, consider genetic testing, surveillance in October 2015   . Esophagogastroduodenoscopy  02/12/2011    CHRONIC ACTIVE GASTRITIS, NO H. pylori  . Transthoracic echocardiogram  11/2011    EF 25% w/ mod dilatation of LV & mod LVH; LA severely dilated; mod-severe dilation of RV with mod-severe decrease in RV function; mild MR & TR  . Cardioversion  04/19/2007    Dr. Marella Chimes  . Transthoracic echocardiogram  02/14/2011    EF 40-45%, mod conc LVH; ventricular septum with motion showing abnormal function, dyssynergy,  paradox; mild AS; mild-mod MR stenosis; LA severely dilated; aptrial septum bowed from L to R with increased LA pressure   . Amputation Right 07/14/2013    Procedure: AMPUTATION DIGIT;  Surgeon: [name], MD;  Location: [facility];  Service: General;  Laterality: Right;  . Amputation Right 08/08/2013    Procedure: AMPUTATION BELOW KNEE;  Surgeon: [name], MD;  Location: [facility];  Service: General;  Laterality: Right;  . Stump revision Right 09/19/2013    Procedure: STUMP REVISION;  Surgeon: Scherry Ran, MD;  Location: [facility];  Service: General;  Laterality: Right;  . Amputation Right 10/17/2013    Procedure: AMPUTATION ABOVE KNEE;  Surgeon: [name], MD;  Location: [facility];  Service: General;  Laterality: Right;  . Colonoscopy N/A 04/10/2014    Procedure: COLONOSCOPY;  Surgeon: [name], MD;  Location: [facility];  Service: Endoscopy;  Laterality: N/A;  42   Family History  Problem Relation Age of Onset  . Diabetes Mother   . Diabetes Father   . Diabetes Sister   . Colon cancer Neg Hx   . Colon polyps Neg Hx    Social History He quit smoking cigarettes about 7 years ago after an 8 pack-year history.  He has never used smokeless tobacco. He also quit alcohol and never used illicit drugs.  Allergies  Allergen Reactions  . Bacitracin Other (See Comments)    unknown  . Norvasc [Amlodipine Besylate] Other (See Comments)    unknown   Prior to Admission medications   Medication Sig Start Date End Date Taking? Authorizing Provider  albuterol (PROVENTIL) (2.5 MG/3ML) 0.083% nebulizer solution Take 3 mLs (2.5 mg total) by nebulization every 2 (two) hours as needed for wheezing. 02/21/14  Yes Kinnie Feil, MD  allopurinol (ZYLOPRIM) 100 MG tablet Take 100 mg by mouth daily.  05/15/11  Yes Historical Provider, MD  amiodarone (PACERONE) 200 MG tablet Take 200 mg by mouth 2 (two) times daily.  07/20/14  Yes Historical Provider, MD  atorvastatin (LIPITOR) 20 MG tablet  Take 1 tablet (20 mg total) by mouth every evening. 04/20/14  Yes Satira Sark, MD  B Complex-C-Zn-Folic Acid (DIALYVITE/ZINC PO) Take by mouth 3 (three) times a week. Patient has dialysis on Tuesdays, Thursdays, and Saturdays   Yes Historical Provider, MD  folic acid (FOLVITE) 1 MG tablet Take 1 mg by mouth daily.     Yes Historical Provider, MD  HYDROcodone-acetaminophen (NORCO/VICODIN) 5-325 MG per tablet Take 1 tablet by mouth every 6 (six) hours as needed for moderate pain. 08/06/14  Yes Carole Civil, MD  insulin glargine (LANTUS) 100 UNIT/ML injection Inject 10 Units into the skin at bedtime.   Yes Historical Provider, MD  metoprolol (LOPRESSOR) 50 MG tablet Take 75 mg by mouth 2 (two) times daily. 07/04/14  Yes Historical Provider, MD  midodrine (PROAMATINE) 5 MG tablet Take 1 tablet (5 mg total) by mouth 3 (  three) times daily before meals. 02/21/14  Yes Kinnie Feil, MD  pantoprazole (PROTONIX) 40 MG tablet Take 40 mg by mouth 2 (two) times daily.  05/25/11  Yes Historical Provider, MD  RENVELA 800 MG tablet Take 800 mg by mouth 3 (three) times daily with meals.  05/03/11  Yes Historical Provider, MD  SENSIPAR 30 MG tablet Take 30 mg by mouth daily. 01/22/14  Yes Historical Provider, MD  traMADol (ULTRAM) 50 MG tablet Take by mouth every 8 (eight) hours as needed for moderate pain.   Yes Historical Provider, MD  traZODone (DESYREL) 50 MG tablet Take 1 tablet (50 mg total) by mouth at bedtime. 08/13/13  Yes Nita Sells, MD  warfarin (COUMADIN) 2.5 MG tablet Take 1.25 tablets by mouth daily.  03/30/14  Yes Historical Provider, MD  metoprolol tartrate (LOPRESSOR) 25 MG tablet Take 0.5 tablets (12.5 mg total) by mouth 2 (two) times daily. Patient not taking: Reported on 08/13/2014 03/03/14   Allie Bossier, MD   Labs:  Results for orders placed or performed during the hospital encounter of 08/13/14 (from the past 48 hour(s))  CBC with Differential     Status: Abnormal   Collection  Time: 08/13/14  2:02 PM  Result Value Ref Range   WBC 14.7 (H) 4.0 - 10.5 K/uL   RBC 2.63 (L) 4.22 - 5.81 MIL/uL   Hemoglobin 8.1 (L) 13.0 - 17.0 g/dL   HCT 25.9 (L) 39.0 - 52.0 %   MCV 98.5 78.0 - 100.0 fL   MCH 30.8 26.0 - 34.0 pg   MCHC 31.3 30.0 - 36.0 g/dL   RDW 16.8 (H) 11.5 - 15.5 %   Platelets 198 150 - 400 K/uL   Neutrophils Relative % 86 (H) 43 - 77 %   Neutro Abs 12.5 (H) 1.7 - 7.7 K/uL   Lymphocytes Relative 4 (L) 12 - 46 %   Lymphs Abs 0.6 (L) 0.7 - 4.0 K/uL   Monocytes Relative 10 3 - 12 %   Monocytes Absolute 1.5 (H) 0.1 - 1.0 K/uL   Eosinophils Relative 0 0 - 5 %   Eosinophils Absolute 0.0 0.0 - 0.7 K/uL   Basophils Relative 0 0 - 1 %   Basophils Absolute 0.0 0.0 - 0.1 K/uL  Basic metabolic panel     Status: Abnormal   Collection Time: 08/13/14  2:02 PM  Result Value Ref Range   Sodium 135 (L) 137 - 147 mEq/L   Potassium 3.8 3.7 - 5.3 mEq/L    Comment: DELTA CHECK NOTED   Chloride 93 (L) 96 - 112 mEq/L   CO2 24 19 - 32 mEq/L   Glucose, Bld 247 (H) 70 - 99 mg/dL   BUN 24 (H) 6 - 23 mg/dL    Comment: DELTA CHECK NOTED   Creatinine, Ser 4.79 (H) 0.50 - 1.35 mg/dL    Comment: DELTA CHECK NOTED   Calcium 8.6 8.4 - 10.5 mg/dL   GFR calc non Af Amer 12 (L) >90 mL/min   GFR calc Af Amer 13 (L) >90 mL/min    Comment: (NOTE) The eGFR has been calculated using the CKD EPI equation. This calculation has not been validated in all clinical situations. eGFR's persistently <90 mL/min signify possible Chronic Kidney Disease.    Anion gap 18 (H) 5 - 15  Protime-INR     Status: Abnormal   Collection Time: 08/13/14  2:02 PM  Result Value Ref Range   Prothrombin Time 41.7 (H) 11.6 - 15.2 seconds  INR 4.32 (H) 0.00 - 1.49  Troponin I     Status: None   Collection Time: 08/13/14  2:02 PM  Result Value Ref Range   Troponin I <0.30 <0.30 ng/mL    Comment:        Due to the release kinetics of cTnI, a negative result within the first hours of the onset of symptoms does  not rule out myocardial infarction with certainty. If myocardial infarction is still suspected, repeat the test at appropriate intervals.   TSH     Status: None   Collection Time: 08/13/14  2:02 PM  Result Value Ref Range   TSH 1.210 0.350 - 4.500 uIU/mL    Comment: Performed at [facility]  Type and screen     Status: None   Collection Time: 08/13/14  5:45 PM  Result Value Ref Range   ABO/RH(D) O POS    Antibody Screen NEG    Sample Expiration 08/16/2014   CBG monitoring, ED     Status: Abnormal   Collection Time: 08/13/14  8:26 PM  Result Value Ref Range   Glucose-Capillary 169 (H) 70 - 99 mg/dL   Comment 1 Notify RN   Glucose, capillary     Status: Abnormal   Collection Time: 08/13/14 10:40 PM  Result Value Ref Range   Glucose-Capillary 159 (H) 70 - 99 mg/dL  MRSA PCR Screening     Status: Abnormal   Collection Time: 08/14/14  2:05 AM  Result Value Ref Range   MRSA by PCR POSITIVE (A) NEGATIVE    Comment:        The GeneXpert MRSA Assay (FDA approved for NASAL specimens only), is one component of a comprehensive MRSA colonization surveillance program. It is not intended to diagnose MRSA infection nor to guide or monitor treatment for MRSA infections. RESULT CALLED TO, READ BACK BY AND VERIFIED WITH: D RICE,RN [phone] WILDERK   Glucose, capillary     Status: Abnormal   Collection Time: 08/14/14  6:12 AM  Result Value Ref Range   Glucose-Capillary 174 (H) 70 - 99 mg/dL  Glucose, capillary     Status: Abnormal   Collection Time: 08/14/14 11:56 AM  Result Value Ref Range   Glucose-Capillary 207 (H) 70 - 99 mg/dL   Constitutional: negative for chills, fatigue, fevers and sweats Ears, nose, mouth, throat, and face: negative for earaches, hoarseness, nasal congestion and sore throat Respiratory: negative for cough, dyspnea on exertion, hemoptysis and sputum Cardiovascular: negative for chest pain, chest pressure/discomfort, dyspnea, orthopnea and  palpitations Gastrointestinal: positive for abdominal pain and constipation, negative for nausea and vomiting Genitourinary:negative, oliguric Musculoskeletal:negative for arthralgias, back pain, myalgias and neck pain Neurological: positive for weakness; negative for dizziness, headaches, paresthesia and speech problems  Physical Exam: Filed Vitals:   08/14/14 1159  BP: 107/67  Pulse: 48  Temp: 98.6 F (37 C)  Resp: 28     General appearance: alert, cooperative and no distress Head: Normocephalic, without obvious abnormality, atraumatic Neck: no adenopathy, no carotid bruit, no JVD and supple, symmetrical, trachea midline Resp: clear to auscultation bilaterally Cardio: regular rate and rhythm, S1, S2 normal, no murmur, click, rub or gallop GI: + BS, soft with RUQ tenderness Extremities: well-healed right AKA, no cyanosis or edema Neurologic: Grossly normal Dialysis Access: AVF @ RFA with + bruit   Dialysis Orders:   TTS @ [facility] in [place] 3.5 hrs     86 kg       2K/2.5Ca  400/600      No Heparin         AVF @ RFA       No Hectorol           Epogen 6000 U per HD         No Venofer  Assessment/Plan: 1. RUQ Pain - sec to R loculated pleural effusion per chest x-ray, Vancomycin & Cefepime empirically, Coumadin on hold, CT Surgery consulted. 2. ESRD -  HD on TTS @ [facility] in [place]; K 3.8.  HD tomorrow. 3. Hypertension/volume - BP 107/67 on Diltiazem 180 mg qd, Metoprolol 75 mg bid; 5 L over EDW, no signs of fluid overload. 4. Anemia - Hgb down to 8.1, Epogen 6000 U per HD. 5. Metabolic bone disease - Ca 8.6 (10.1 corrected), no Vitamin D, Sensipar 30 mg qd,Renvela 3 with meals, 1 with snacks. 6. Nutrition - Alb 2.5, renal carb-mod diet, vitamin. 7. Hx CABG/MVR - 01/05/2007. 8. Hx A-fib - on Amiodarone, Diltiazem, Coumadin (currently on hold). 9. DM Type 2 - insulin per primary.  [name],[name] 08/14/2014, 12:41 PM   Attending Nephrologist: Roney Jaffe, MD  Pt seen, examined  and agree w A/P as above. ESRD with R-sided abd pain and large loculated R pleural effusion. On empiric abx and CT Surg consulted. No need for HD today, plan HD on Sat per usual schedule.  Kelly Splinter MD pager [phone]    cell [phone] 08/14/2014, 3:38 PM

## 2014-08-15 DIAGNOSIS — J90 Pleural effusion, not elsewhere classified: Secondary | ICD-10-CM

## 2014-08-15 DIAGNOSIS — D649 Anemia, unspecified: Secondary | ICD-10-CM

## 2014-08-15 DIAGNOSIS — I4891 Unspecified atrial fibrillation: Secondary | ICD-10-CM

## 2014-08-15 DIAGNOSIS — A419 Sepsis, unspecified organism: Secondary | ICD-10-CM

## 2014-08-15 DIAGNOSIS — R6521 Severe sepsis with septic shock: Secondary | ICD-10-CM

## 2014-08-15 DIAGNOSIS — R570 Cardiogenic shock: Secondary | ICD-10-CM

## 2014-08-15 LAB — CBC
HCT: 23.9 % — ABNORMAL LOW (ref 39.0–52.0)
Hemoglobin: 7.7 g/dL — ABNORMAL LOW (ref 13.0–17.0)
MCH: 29.5 pg (ref 26.0–34.0)
MCHC: 32.2 g/dL (ref 30.0–36.0)
MCV: 91.6 fL (ref 78.0–100.0)
Platelets: 201 10*3/uL (ref 150–400)
RBC: 2.61 MIL/uL — ABNORMAL LOW (ref 4.22–5.81)
RDW: 16.5 % — AB (ref 11.5–15.5)
WBC: 10.4 10*3/uL (ref 4.0–10.5)

## 2014-08-15 LAB — GLUCOSE, CAPILLARY
GLUCOSE-CAPILLARY: 202 mg/dL — AB (ref 70–99)
Glucose-Capillary: 151 mg/dL — ABNORMAL HIGH (ref 70–99)
Glucose-Capillary: 217 mg/dL — ABNORMAL HIGH (ref 70–99)

## 2014-08-15 LAB — COMPREHENSIVE METABOLIC PANEL
ALT: 24 U/L (ref 0–53)
ANION GAP: 20 — AB (ref 5–15)
AST: 34 U/L (ref 0–37)
Albumin: 2.1 g/dL — ABNORMAL LOW (ref 3.5–5.2)
Alkaline Phosphatase: 151 U/L — ABNORMAL HIGH (ref 39–117)
BILIRUBIN TOTAL: 2 mg/dL — AB (ref 0.3–1.2)
BUN: 45 mg/dL — AB (ref 6–23)
CHLORIDE: 87 meq/L — AB (ref 96–112)
CO2: 23 meq/L (ref 19–32)
CREATININE: 7.21 mg/dL — AB (ref 0.50–1.35)
Calcium: 8.4 mg/dL (ref 8.4–10.5)
GFR calc Af Amer: 8 mL/min — ABNORMAL LOW (ref >90)
GFR calc non Af Amer: 7 mL/min — ABNORMAL LOW (ref >90)
Glucose, Bld: 242 mg/dL — ABNORMAL HIGH (ref 70–99)
Potassium: 4.7 mEq/L (ref 3.7–5.3)
Sodium: 130 mEq/L — ABNORMAL LOW (ref 137–147)
Total Protein: 6.5 g/dL (ref 6.0–8.3)

## 2014-08-15 LAB — PROTIME-INR
INR: 4.42 — ABNORMAL HIGH (ref 0.00–1.49)
INR: 1.8 — ABNORMAL HIGH (ref 0.00–1.49)
Prothrombin Time: 42.5 seconds — ABNORMAL HIGH (ref 11.6–15.2)
Prothrombin Time: 21.1 seconds — ABNORMAL HIGH (ref 11.6–15.2)

## 2014-08-15 LAB — PRO B NATRIURETIC PEPTIDE

## 2014-08-15 LAB — LACTIC ACID, PLASMA: LACTIC ACID, VENOUS: 1.6 mmol/L (ref 0.5–2.2)

## 2014-08-15 LAB — HEPATITIS B SURFACE ANTIGEN: Hepatitis B Surface Ag: NEGATIVE

## 2014-08-15 MED ORDER — PENTAFLUOROPROP-TETRAFLUOROETH EX AERO: 1.0000 "application " | INHALATION_SPRAY | CUTANEOUS | Status: DC | PRN

## 2014-08-15 MED ORDER — NEPRO/CARBSTEADY PO LIQD
237.0000 mL | ORAL | Status: DC | PRN
  Filled 2014-08-15: qty 237

## 2014-08-15 MED ORDER — LIDOCAINE HCL (PF) 1 % IJ SOLN: 5.0000 mL | INTRAMUSCULAR | Status: DC | PRN

## 2014-08-15 MED ORDER — LIDOCAINE-PRILOCAINE 2.5-2.5 % EX CREA
1.0000 "application " | TOPICAL_CREAM | CUTANEOUS | Status: DC | PRN
  Filled 2014-08-15: qty 5

## 2014-08-15 MED ORDER — SODIUM CHLORIDE 0.9 % IV BOLUS (SEPSIS)
500.0000 mL | Freq: Once | INTRAVENOUS | Status: AC
  Administered 2014-08-15: 500 mL via INTRAVENOUS

## 2014-08-15 MED ORDER — SODIUM CHLORIDE 0.9 % IV SOLN: 100.0000 mL | INTRAVENOUS | Status: DC | PRN

## 2014-08-15 MED ORDER — MIDODRINE HCL 5 MG PO TABS
ORAL_TABLET | ORAL | Status: AC
  Filled 2014-08-15: qty 1

## 2014-08-15 MED ORDER — HEPARIN SODIUM (PORCINE) 1000 UNIT/ML DIALYSIS: 1000.0000 [IU] | INTRAMUSCULAR | Status: DC | PRN

## 2014-08-15 MED ORDER — ALTEPLASE 2 MG IJ SOLR
2.0000 mg | Freq: Once | INTRAMUSCULAR | Status: DC | PRN
  Filled 2014-08-15: qty 2

## 2014-08-15 MED ORDER — NOREPINEPHRINE BITARTRATE 1 MG/ML IV SOLN
0.0000 ug/min | INTRAVENOUS | Status: DC
  Filled 2014-08-15: qty 16

## 2014-08-15 MED ORDER — VITAMIN K1 10 MG/ML IJ SOLN
10.0000 mg | Freq: Once | INTRAVENOUS | Status: AC
  Administered 2014-08-15: 10 mg via INTRAVENOUS
  Filled 2014-08-15: qty 1

## 2014-08-15 NOTE — Progress Notes (Signed)
[facility] Progress Note   Subjective: no complaints  Filed Vitals:   08/15/14 0400 08/15/14 0500 08/15/14 0600 08/15/14 0700  BP: 111/56 105/57 103/58 105/49  Pulse:      Temp:      TempSrc:      Resp: 18 17 18 21   Height:      Weight:      SpO2: 100% 100% 99% 97%   Exam: Alert, no distress, calm No jvd Chest clear bilat RRR no MRG Abd soft, NTND, mild RUQ tenderness R AKA clean wound and no LE edema  Neuro is nf, Ox 3 AVF RFA with + bruit  Dialysis Orders: TTS [facility] [place] 3.5 hrs 86 kg 2K/2.5Ca 400/600 No Heparin AVF @ RFA  No Hectorol Epogen 6000 U per HD No Venofer  Assessment/Plan: 1. RUQ Pain / loculated R pleural effusion / Duncan Dull 20k - on empiric IV abx.  Seen by CT Surg, recommended thoracentesis when INR down to see if fluid appears infected.  Suspecting old hemothorax 2. ESRD - HD TTS 3. HTN/volume - up 5 kg, on MTP/ diltiazem , BP's soft 4. Anemia - Hgb down 7.7, on EPO 5. Metabolic bone disease - Ca 8.6 (10.1 corrected), no Vitamin D, Sensipar 30 mg qd,Renvela 3 with meals, 1 with snacks. 6. Nutrition - Alb 2.5, renal carb-mod diet, vitamin. 7. Hx CABG/MVR - 01/05/2007. 8. Hx A-fib - on Amiodarone, Diltiazem (and coumadin currently on hold). 9. DM Type 2 - insulin per primary.  Plan- HD today , max UF as BP will tolerate, get standing wt.  Will follow.     Kelly Splinter MD  pager [phone]    cell [phone]  08/15/2014, 8:18 AM     Recent Labs Lab 08/13/14 0403 08/13/14 1402 08/15/14 0245  NA 138 135* 130*  K 5.1 3.8 4.7  CL 96 93* 87*  CO2 21 24 23   GLUCOSE 186* 247* 242*  BUN 55* 24* 45*  CREATININE 9.42* 4.79* 7.21*  CALCIUM 9.0 8.6 8.4    Recent Labs Lab 08/13/14 0403 08/15/14 0245  AST 34 34  ALT 20 24  ALKPHOS 168* 151*  BILITOT 1.5* 2.0*  PROT 7.1 6.5  ALBUMIN 2.5* 2.1*    Recent Labs Lab 08/13/14 0403 08/13/14 1402 08/15/14 0245  WBC 20.8*  14.7* 10.4  NEUTROABS 18.2* 12.5*  --   HGB 8.5* 8.1* 7.7*  HCT 26.7* 25.9* 23.9*  MCV 97.4 98.5 91.6  PLT 195 198 201   . allopurinol  100 mg Oral Daily  . amiodarone  200 mg Oral BID  . atorvastatin  20 mg Oral QPM  . ceFEPime (MAXIPIME) IV  2 g Intravenous Q T,Th,Sa-HD  . cinacalcet  30 mg Oral Q breakfast  . folic acid  1 mg Oral Daily  . insulin aspart  0-5 Units Subcutaneous QHS  . insulin aspart  0-9 Units Subcutaneous TID WC  . insulin glargine  10 Units Subcutaneous QHS  . metoprolol  75 mg Oral BID  . midodrine  5 mg Oral TID AC  . pantoprazole  40 mg Oral BID  . phytonadione (VITAMIN K) IV  10 mg Intravenous Once  . phytonadione (VITAMIN K) IV  5 mg Intravenous Once  . sevelamer carbonate  800 mg Oral TID WC  . sodium chloride  3 mL Intravenous Q12H  . sodium chloride  3 mL Intravenous Q12H  . traZODone  50 mg Oral QHS  . vancomycin  1,000 mg Intravenous Q T,Th,Sa-HD   .  amiodarone 30 mg/hr (08/15/14 0803)   albuterol, HYDROcodone-acetaminophen, ondansetron **OR** ondansetron (ZOFRAN) IV, traMADol

## 2014-08-15 NOTE — Consult Note (Signed)
CONSULTATION NOTE  Reason for Consult: A-fib with RVR, hypotension  Requesting Physician: Dr. Waldron Labs  Cardiologist: Dr. Domenic Polite  HPI: This is a 63 y.o. male with a past medical history significant for CAD is post CABG 4 and history of bioprosthetic mitral valve replacement with left atrial appendage closure and right-sided Maze procedure for perioperative AF in 2008. He also has end-stage renal disease on dialysis. He has significant cardiomyopathy with EF of 15-20% and grade 3 diastolic dysfunction with minimal aortic stenosis. He's been maintained on warfarin and amiodarone. He was last seen by Dr. Domenic Polite in August 2015. Visit discussion about possible AICD but because of the risk of infection on dialysis and was felt that he was not a candidate that time.  He now presents with abdominal pain, anorexia and bloody stools. He is on warfarin for his AF. CT scan of the abdomen also caught part of the chest and was treated a large loculated pleural effusion. He does have a high white blood cell count of 20,000 and is on broad-spectrum antibiotics. He denied any chest pain was but did but was describing right flank pain. Troponin is negative x 2 this admission. He has been seen by CT surgery recommended correcting INR and performing thoracentesis, and if the effusion is not improved considering VATS or percutaneous drainage. He did undergo hemodialysis today and about 3.5 L of fluid was taken off. He is returned a hypotensive with blood pressure in the 80s over 40s. Heart rate remains in the 130s on IV amiodarone. Cardiology is consult for management opinion regarding his atrial fibrillation.  PMHx:  Past Medical History  Diagnosis Date  . UGI bleed 02/12/11    On anticoagulation; EGD w/snare polypectomy-multiple polypoid lesions antrum(benign), Chronic active gastritis (NEGATIVE H pylori)  . Anemia, chronic disease   . Sepsis due to enterococcus 02/11/11  . Atrial fibrillation     On  coumadin  . Coronary atherosclerosis of native coronary artery     Multivessel status post CABG  . Cardiomyopathy     LVEF 40-45% 01/2011  . Type 2 diabetes mellitus   . Essential hypertension, benign   . Hyperlipemia   . Gout   . S/P patent foramen ovale closure   . Hyperbilirubinemia 08/10/2013  . SBO (small bowel obstruction) 01/2014  . 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 Clostridium difficile colitis 01/2014  . Diverticulosis   . Internal hemorrhoids   . ESRD on hemodialysis     Started dialysis sometime around 2008-2009.  Gets HD in [place], [place] with Dr Hinda Lenis on a TTS schedule.  Runs 3.5 hours with dry wt 86.5 kg as of June 2015.  Had a L arm AVF which didn't work and has been using his R forearm AVF for several years.     Past Surgical History  Procedure Laterality Date  . Mitral valve replacement  01/05/2007    Bioprosthetic 53m Edwards precordial tissue (Dr. GServando Snare  . Coronary artery bypass graft  01/05/2007    LIMA-LAD, SVG-OM, seq. SVG-PDA, PLA-RCA  . Colonoscopy  06/23/2011    Dr. FOneida Alar multiple sessile and pedunculated polyps, moderate diverticulosis, internal hemorrhoids, +ADENOMATOUS POLYPS, consider genetic testing, surveillance in October 2015   . Esophagogastroduodenoscopy  02/12/2011    CHRONIC ACTIVE GASTRITIS, NO H. pylori  . Transthoracic echocardiogram  11/2011    EF 25% w/ mod dilatation of LV & mod LVH; LA severely dilated; mod-severe dilation of RV with mod-severe decrease in RV function; mild MR &  TR  . Cardioversion  04/19/2007    Dr. Marella Chimes  . Transthoracic echocardiogram  02/14/2011    EF 40-45%, mod conc LVH; ventricular septum with motion showing abnormal function, dyssynergy, paradox; mild AS; mild-mod MR stenosis; LA severely dilated; aptrial septum bowed from L to R with increased LA pressure   . Amputation Right 07/14/2013    Procedure: AMPUTATION DIGIT;  Surgeon: [name], MD;  Location: [facility];  Service: General;  Laterality: Right;    . Amputation Right 08/08/2013    Procedure: AMPUTATION BELOW KNEE;  Surgeon: [name], MD;  Location: [facility];  Service: General;  Laterality: Right;  . Stump revision Right 09/19/2013    Procedure: STUMP REVISION;  Surgeon: Scherry Ran, MD;  Location: [facility];  Service: General;  Laterality: Right;  . Amputation Right 10/17/2013    Procedure: AMPUTATION ABOVE KNEE;  Surgeon: [name], MD;  Location: [facility];  Service: General;  Laterality: Right;  . Colonoscopy N/A 04/10/2014    Procedure: COLONOSCOPY;  Surgeon: [name], MD;  Location: [facility];  Service: Endoscopy;  Laterality: N/A;  830    FAMHx: Family History  Problem Relation Age of Onset  . Diabetes Mother   . Diabetes Father   . Diabetes Sister   . Colon cancer Neg Hx   . Colon polyps Neg Hx     SOCHx:  reports that he quit smoking about 7 years ago. His smoking use included Cigarettes. He started smoking about 45 years ago. He has a 8 pack-year smoking history. He has never used smokeless tobacco. He reports that he does not drink alcohol or use illicit drugs.  ALLERGIES: Allergies  Allergen Reactions  . Bacitracin Other (See Comments)    unknown  . Norvasc [Amlodipine Besylate] Other (See Comments)    unknown    ROS: A comprehensive review of systems was negative except for: Constitutional: positive for fatigue Respiratory: positive for dyspnea on exertion Cardiovascular: positive for chest pressure/discomfort  HOME MEDICATIONS: Prescriptions prior to admission  Medication Sig Dispense Refill Last Dose  . albuterol (PROVENTIL) (2.5 MG/3ML) 0.083% nebulizer solution Take 3 mLs (2.5 mg total) by nebulization every 2 (two) hours as needed for wheezing. 75 mL 12 unknown  . allopurinol (ZYLOPRIM) 100 MG tablet Take 100 mg by mouth daily.    08/12/2014 at Unknown time  . amiodarone (PACERONE) 200 MG tablet Take 200 mg by mouth 2 (two) times daily.    08/12/2014 at Unknown time  . atorvastatin  (LIPITOR) 20 MG tablet Take 1 tablet (20 mg total) by mouth every evening. 30 tablet 3 08/12/2014 at Unknown time  . B Complex-C-Zn-Folic Acid (DIALYVITE/ZINC PO) Take by mouth 3 (three) times a week. Patient has dialysis on Tuesdays, Thursdays, and Saturdays   55/97/4163  . folic acid (FOLVITE) 1 MG tablet Take 1 mg by mouth daily.     08/12/2014 at Unknown time  . HYDROcodone-acetaminophen (NORCO/VICODIN) 5-325 MG per tablet Take 1 tablet by mouth every 6 (six) hours as needed for moderate pain. 100 tablet 0 08/12/2014 at Unknown time  . insulin glargine (LANTUS) 100 UNIT/ML injection Inject 10 Units into the skin at bedtime.   08/12/2014 at Unknown time  . metoprolol (LOPRESSOR) 50 MG tablet Take 75 mg by mouth 2 (two) times daily.   08/12/2014 at 800 pm  . midodrine (PROAMATINE) 5 MG tablet Take 1 tablet (5 mg total) by mouth 3 (three) times daily before meals. 60 tablet  0 08/12/2014 at Unknown time  . pantoprazole (PROTONIX) 40 MG tablet Take 40 mg by mouth 2 (two) times daily.    08/12/2014 at Unknown time  . RENVELA 800 MG tablet Take 800 mg by mouth 3 (three) times daily with meals.    08/12/2014 at Unknown time  . SENSIPAR 30 MG tablet Take 30 mg by mouth daily.   08/12/2014 at Unknown time  . traMADol (ULTRAM) 50 MG tablet Take by mouth every 8 (eight) hours as needed for moderate pain.   08/12/2014 at Unknown time  . traZODone (DESYREL) 50 MG tablet Take 1 tablet (50 mg total) by mouth at bedtime. 30 tablet 0 08/12/2014 at Unknown time  . warfarin (COUMADIN) 2.5 MG tablet Take 1.25 tablets by mouth daily.    08/12/2014 at Unknown time  . metoprolol tartrate (LOPRESSOR) 25 MG tablet Take 0.5 tablets (12.5 mg total) by mouth 2 (two) times daily. (Patient not taking: Reported on 08/13/2014) 30 tablet 0 [place]: Prior to Admission:  Prescriptions prior to admission  Medication Sig Dispense Refill Last Dose  . albuterol (PROVENTIL) (2.5 MG/3ML) 0.083% nebulizer  solution Take 3 mLs (2.5 mg total) by nebulization every 2 (two) hours as needed for wheezing. 75 mL 12 unknown  . allopurinol (ZYLOPRIM) 100 MG tablet Take 100 mg by mouth daily.    08/12/2014 at Unknown time  . amiodarone (PACERONE) 200 MG tablet Take 200 mg by mouth 2 (two) times daily.    08/12/2014 at Unknown time  . atorvastatin (LIPITOR) 20 MG tablet Take 1 tablet (20 mg total) by mouth every evening. 30 tablet 3 08/12/2014 at Unknown time  . B Complex-C-Zn-Folic Acid (DIALYVITE/ZINC PO) Take by mouth 3 (three) times a week. Patient has dialysis on Tuesdays, Thursdays, and Saturdays   40/98/1191  . folic acid (FOLVITE) 1 MG tablet Take 1 mg by mouth daily.     08/12/2014 at Unknown time  . HYDROcodone-acetaminophen (NORCO/VICODIN) 5-325 MG per tablet Take 1 tablet by mouth every 6 (six) hours as needed for moderate pain. 100 tablet 0 08/12/2014 at Unknown time  . insulin glargine (LANTUS) 100 UNIT/ML injection Inject 10 Units into the skin at bedtime.   08/12/2014 at Unknown time  . metoprolol (LOPRESSOR) 50 MG tablet Take 75 mg by mouth 2 (two) times daily.   08/12/2014 at 800 pm  . midodrine (PROAMATINE) 5 MG tablet Take 1 tablet (5 mg total) by mouth 3 (three) times daily before meals. 60 tablet 0 08/12/2014 at Unknown time  . pantoprazole (PROTONIX) 40 MG tablet Take 40 mg by mouth 2 (two) times daily.    08/12/2014 at Unknown time  . RENVELA 800 MG tablet Take 800 mg by mouth 3 (three) times daily with meals.    08/12/2014 at Unknown time  . SENSIPAR 30 MG tablet Take 30 mg by mouth daily.   08/12/2014 at Unknown time  . traMADol (ULTRAM) 50 MG tablet Take by mouth every 8 (eight) hours as needed for moderate pain.   08/12/2014 at Unknown time  . traZODone (DESYREL) 50 MG tablet Take 1 tablet (50 mg total) by mouth at bedtime. 30 tablet 0 08/12/2014 at Unknown time  . warfarin (COUMADIN) 2.5 MG tablet Take 1.25 tablets by mouth daily.    08/12/2014 at Unknown time  . metoprolol tartrate  (LOPRESSOR) 25 MG tablet Take 0.5 tablets (12.5 mg total) by mouth 2 (two) times daily. (Patient not taking: Reported on 08/13/2014) 30 tablet 0 Taking  VITALS: Blood pressure 80/47, pulse 130, temperature 100.3 F (37.9 C), temperature source Oral, resp. rate 23, height 6' 4"  (1.93 m), weight 192 lb 14.4 oz (87.5 kg), SpO2 96 %.  PHYSICAL EXAM: General appearance: alert, mild distress and toxic Neck: JVD - 10 cm above sternal notch and no carotid bruit Lungs: diminished breath sounds bilaterally and dullness to percussion RLL, RML and RUL Heart: irregularly irregular rhythm and tachycardic Abdomen: soft, non-tender; bowel sounds normal; no masses,  no organomegaly Extremities: edema 1+ Pulses: 2+ and symmetric Skin: Skin color, texture, turgor normal. No rashes or lesions Neurologic: Mental status: Somewhat somnolent, arouses to voice, follows commands Psych: Normal  LABS: Results for orders placed or performed during the hospital encounter of 08/13/14 (from the past 48 hour(s))  Type and screen     Status: None   Collection Time: 08/13/14  5:45 PM  Result Value Ref Range   ABO/RH(D) O POS    Antibody Screen NEG    Sample Expiration 08/16/2014   Blood culture (routine x 2)     Status: None (Preliminary result)   Collection Time: 08/13/14  5:45 PM  Result Value Ref Range   Specimen Description BLOOD LEFT HAND    Special Requests BOTTLES DRAWN AEROBIC ONLY 4CC    Culture NO GROWTH 2 DAYS    Report Status PENDING   Blood culture (routine x 2)     Status: None (Preliminary result)   Collection Time: 08/13/14  5:55 PM  Result Value Ref Range   Specimen Description BLOOD LEFT ARM    Special Requests      BOTTLES DRAWN AEROBIC AND ANAEROBIC AEB=8CC ANA=6CC   Culture NO GROWTH 2 DAYS    Report Status PENDING   CBG monitoring, ED     Status: Abnormal   Collection Time: 08/13/14  8:26 PM  Result Value Ref Range   Glucose-Capillary 169 (H) 70 - 99 mg/dL   Comment 1 Notify RN     Glucose, capillary     Status: Abnormal   Collection Time: 08/13/14 10:40 PM  Result Value Ref Range   Glucose-Capillary 159 (H) 70 - 99 mg/dL  MRSA PCR Screening     Status: Abnormal   Collection Time: 08/14/14  2:05 AM  Result Value Ref Range   MRSA by PCR POSITIVE (A) NEGATIVE    Comment:        The GeneXpert MRSA Assay (FDA approved for NASAL specimens only), is one component of a comprehensive MRSA colonization surveillance program. It is not intended to diagnose MRSA infection nor to guide or monitor treatment for MRSA infections. RESULT CALLED TO, READ BACK BY AND VERIFIED WITH: D RICE,RN [id number] WILDERK   Glucose, capillary     Status: Abnormal   Collection Time: 08/14/14  6:12 AM  Result Value Ref Range   Glucose-Capillary 174 (H) 70 - 99 mg/dL  Glucose, capillary     Status: Abnormal   Collection Time: 08/14/14 11:56 AM  Result Value Ref Range   Glucose-Capillary 207 (H) 70 - 99 mg/dL  Glucose, capillary     Status: Abnormal   Collection Time: 08/14/14  4:54 PM  Result Value Ref Range   Glucose-Capillary 176 (H) 70 - 99 mg/dL   Comment 1 Capillary Sample   Glucose, capillary     Status: Abnormal   Collection Time: 08/14/14  9:31 PM  Result Value Ref Range   Glucose-Capillary 151 (H) 70 - 99 mg/dL   Comment 1 Capillary Sample  Comprehensive metabolic panel     Status: Abnormal   Collection Time: 08/15/14  2:45 AM  Result Value Ref Range   Sodium 130 (L) 137 - 147 mEq/L   Potassium 4.7 3.7 - 5.3 mEq/L   Chloride 87 (L) 96 - 112 mEq/L   CO2 23 19 - 32 mEq/L   Glucose, Bld 242 (H) 70 - 99 mg/dL   BUN 45 (H) 6 - 23 mg/dL   Creatinine, Ser 7.21 (H) 0.50 - 1.35 mg/dL   Calcium 8.4 8.4 - 10.5 mg/dL   Total Protein 6.5 6.0 - 8.3 g/dL   Albumin 2.1 (L) 3.5 - 5.2 g/dL   AST 34 0 - 37 U/L   ALT 24 0 - 53 U/L   Alkaline Phosphatase 151 (H) 39 - 117 U/L   Total Bilirubin 2.0 (H) 0.3 - 1.2 mg/dL   GFR calc non Af Amer 7 (L) >90 mL/min   GFR calc Af Amer 8  (L) >90 mL/min    Comment: (NOTE) The eGFR has been calculated using the CKD EPI equation. This calculation has not been validated in all clinical situations. eGFR's persistently <90 mL/min signify possible Chronic Kidney Disease.    Anion gap 20 (H) 5 - 15  CBC     Status: Abnormal   Collection Time: 08/15/14  2:45 AM  Result Value Ref Range   WBC 10.4 4.0 - 10.5 K/uL   RBC 2.61 (L) 4.22 - 5.81 MIL/uL   Hemoglobin 7.7 (L) 13.0 - 17.0 g/dL   HCT 23.9 (L) 39.0 - 52.0 %   MCV 91.6 78.0 - 100.0 fL   MCH 29.5 26.0 - 34.0 pg   MCHC 32.2 30.0 - 36.0 g/dL   RDW 16.5 (H) 11.5 - 15.5 %   Platelets 201 150 - 400 K/uL  Protime-INR     Status: Abnormal   Collection Time: 08/15/14  2:45 AM  Result Value Ref Range   Prothrombin Time 42.5 (H) 11.6 - 15.2 seconds   INR 4.42 (H) 0.00 - 1.49  Pro b natriuretic peptide     Status: Abnormal   Collection Time: 08/15/14  2:45 AM  Result Value Ref Range   Pro B Natriuretic peptide (BNP) >70000.0 (H) 0 - 125 pg/mL  Hepatitis B surface antigen     Status: None   Collection Time: 08/15/14  8:53 AM  Result Value Ref Range   Hepatitis B Surface Ag NEGATIVE NEGATIVE    Comment: Performed at [facility]  Glucose, capillary     Status: Abnormal   Collection Time: 08/15/14 12:55 PM  Result Value Ref Range   Glucose-Capillary 151 (H) 70 - 99 mg/dL    IMAGING: No results found.  HOSPITAL DIAGNOSES: Principal Problem:   Loculated pleural effusion Active Problems:   Atrial fibrillation   Markes term current use of anticoagulant therapy   ESRD (end stage renal disease) on dialysis   DM type 2 causing ESRD   Septic shock   Pleural effusion, right   Recommendations: 1. Atrial fibrillation with rapid ventricular response-at this point blood pressure is limiting treatment options. He is currently on IV amiodarone. There is little room for additional AV nodal blockers. I would continue IV amiodarone. Given his loculated pleural effusion and  concern for infection/sepsis, is not clear that cardioversion would hold or be successful. 2. Septic and/or cardiogenic shock-severely decreased ejection fraction now with MAP in the 50s, he is reporting some chest discomfort with persistent tachycardia. His hypotensive may been related  to significant volume or removal. Consider volume challenge. If he does not respond to this we may need to consider pressors - favoring Levophed or Neo-Synephrine. 3. Ischemic cardiomyopathy EF 15-20% - very challenging to manage given end-stage renal disease. Volume status management would be per nephrology. His atrial fibrillation with rapid ventricular response is compromising cardiac output.  4. Loculated right pleural effusion-plan for percutaneous drainage, high suspicion for hemothorax. Is not clear whether there is ongoing bleeding. His H&H is declining. 5. End-stage renal disease on hemodialysis- volume management per nephrology. It's unclear whether he is going to tolerate significant volume shifts with hemodialysis. He may need CVVH. 6. Anemia - suspect acute blood loss on anemia related to chronic kidney disease. Consider transfusion of 2U PRBC's. 7. Hypoalbuminemia-may benefit from colloid with blood transfusion to increase oncotic pressure.  Thanks for consulting [place]. We will follow along closely with you.  Time Spent Directly with Patient: 60 minutes of direct, critical care time  CRITICAL CARE:  The patient is critically ill with multi-organ system failure and requires high complexity decision making for assessment and support, frequent evaluation and titration of therapies, application of advanced monitoring technologies and extensive interpretation of multiple databases.  Pixie Casino, MD, [facility] Attending Cardiologist [facility]  Natha Guin C 08/15/2014, 3:32 PM

## 2014-08-15 NOTE — Consult Note (Signed)
PULMONARY / CRITICAL CARE MEDICINE   Name: [name] [name] MRN: [id number] DOB: 06/29/1951    ADMISSION DATE:  08/13/2014 CONSULTATION DATE:  12/19  REFERRING MD :  Elgergawy   CHIEF COMPLAINT:  Shock and AF w/ RVR   INITIAL PRESENTATION:  This is a 63 year old male w/ complicated med hx: ESRD, type II DM, CABG/ MVR in 2008, CHF, cardiomyopathy (EF 15-20 %), atrial fibrillation, GI bleeds, SBP, pneumatosis coli,  c diff colitis. Admitted 12/17 w/ working dx of secondary infection of chronic loculated right effusion. Was being treated w/ ABX, correcting INR w/ Vit K w/ planned thoracentesis and possible VATS. On 12/19 PCCM was consulted for hypotension when he returned from HD w/ SBP in 70s    STUDIES:  CT abd 12/17: Large loculated right pleural effusion. This may represent contracting hemothorax based on the prior CT.Liver and spleen normal. Small calcified gallstones. Gallbladder is contracted and not thickened. Bile ducts nondilated. Pancreas and spleen are normal.Small kidneys due to chronic renal failure. No renal mass or hydronephrosis.Negative for bowel obstruction. No bowel thickening. Negative for diverticulitis. Normal appendix. Resolution of thickening of the cecum compared with the prior study which may be resolution of colitis  SIGNIFICANT EVENTS: 12/17: admitted for sepsis felt to be d/t secondary infxn of right chronic effusion. ABX started 12/18 seen by thoracic. Recommended thoracentesis but INR was supra therapeutic  12/19 hypotensive after HD  HISTORY OF PRESENT ILLNESS:   63 yo man with a complicated medical history significant for ESRD, type II DM, CABG/ MVR in 2008, CHF, cardiomyopathy (EF 15-20 %), atrial fibrillation, GI bleeds, SBP, pneumatosis coli,  c diff colitis. Presented to ER 12/17 w/ cc:  right flank pain which started about 4 days prior It waxes and wanes.. His appetite has been poor. His wife noted blood in stools. He denied cough, shortness of breath and  fever. However, his WBC was elevated at 20K on admission. He was started on vanco mycin and cefepime empirically.He had a CT in the ED which showed a loculated right pleural effusion. This is smaller than it was in July but chronic in nature (we noted back in July). He was admitted to the medical service. Treated w/ IV abx, seen by thoracic surg given concern that the pleural space might be infected, cards for AF w/ RVR and renal for HD support. On am rounds 12/19 he had no complaints. Went for HD. Had net neg 3.5 liters. Returned to the SDU tachycardic and hypotensive. PCCM asked to assess.   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  has past surgical history that includes Mitral valve replacement (01/05/2007); Coronary artery bypass graft (01/05/2007); Colonoscopy (06/23/2011); Esophagogastroduodenoscopy (02/12/2011); transthoracic echocardiogram (11/2011); Cardioversion (04/19/2007); transthoracic echocardiogram (02/14/2011); Amputation (Right, 07/14/2013); Amputation (Right, 08/08/2013); Stump revision (Right, 09/19/2013); Amputation (Right, 10/17/2013); and Colonoscopy (N/A, 04/10/2014). Prior to Admission medications   Medication Sig Start Date End Date Taking? Authorizing Provider  albuterol (PROVENTIL) (2.5 MG/3ML) 0.083% nebulizer solution Take 3 mLs (2.5 mg total) by nebulization every 2 (two) hours as needed for wheezing. 02/21/14  Yes Kinnie Feil, MD  allopurinol (ZYLOPRIM) 100 MG tablet  Take 100 mg by mouth  daily.  05/15/11  Yes Historical Provider, MD  amiodarone (PACERONE) 200 MG tablet Take 200 mg by mouth 2 (two) times daily.  07/20/14  Yes Historical Provider, MD  atorvastatin (LIPITOR) 20 MG tablet Take 1 tablet (20 mg total) by mouth every evening. 04/20/14  Yes Satira Sark, MD  B Complex-C-Zn-Folic Acid (DIALYVITE/ZINC PO) Take by mouth 3 (three) times a week. Patient has dialysis on Tuesdays, Thursdays, and Saturdays   Yes Historical Provider, MD  folic acid (FOLVITE) 1 MG tablet Take 1 mg by mouth daily.     Yes Historical Provider, MD  HYDROcodone-acetaminophen (NORCO/VICODIN) 5-325 MG per tablet Take 1 tablet by mouth every 6 (six) hours as needed for moderate pain. 08/06/14  Yes Carole Civil, MD  insulin glargine (LANTUS) 100 UNIT/ML injection Inject 10 Units into the skin at bedtime.   Yes Historical Provider, MD  metoprolol (LOPRESSOR) 50 MG tablet Take 75 mg by mouth 2 (two) times daily. 07/04/14  Yes Historical Provider, MD  midodrine (PROAMATINE) 5 MG tablet Take 1 tablet (5 mg total) by mouth 3 (three) times daily before meals. 02/21/14  Yes Kinnie Feil, MD  pantoprazole (PROTONIX) 40 MG tablet Take 40 mg by mouth 2 (two) times daily.  05/25/11  Yes Historical Provider, MD  RENVELA 800 MG tablet Take 800 mg by mouth 3 (three) times daily with meals.  05/03/11  Yes Historical Provider, MD  SENSIPAR 30 MG tablet Take 30 mg by mouth daily. 01/22/14  Yes Historical Provider, MD  traMADol (ULTRAM) 50 MG tablet Take by mouth every 8 (eight) hours as needed for moderate pain.   Yes Historical Provider, MD  traZODone (DESYREL) 50 MG tablet Take 1 tablet (50 mg total) by mouth at bedtime. 08/13/13  Yes Nita Sells, MD  warfarin (COUMADIN) 2.5 MG tablet Take 1.25 tablets by mouth daily.  03/30/14  Yes Historical Provider, MD  metoprolol tartrate (LOPRESSOR) 25 MG tablet Take 0.5 tablets (12.5 mg total) by mouth 2 (two) times daily. Patient not taking: Reported on 08/13/2014  03/03/14   Allie Bossier, MD   Allergies  Allergen Reactions  . Bacitracin Other (See Comments)    unknown  . Norvasc [Amlodipine Besylate] Other (See Comments)    unknown    FAMILY HISTORY:  indicated that his mother is deceased. He indicated that his father is deceased. He indicated that both of his sisters are alive. He indicated that only one of his two brothers is alive.  SOCIAL HISTORY:  reports that he quit smoking about 7 years ago. His smoking use included Cigarettes. He started smoking about 45 years ago. He has a 8 pack-year smoking history. He has never used smokeless tobacco. He reports that he does not drink alcohol or use illicit drugs.  REVIEW OF SYSTEMS:   Review of Systems:   Bolds are positive  Constitutional: weight loss, gain, night sweats, Fevers, chills, fatigue .  HEENT: headaches, Sore throat, sneezing, nasal congestion, post nasal drip, Difficulty swallowing, Tooth/dental problems, visual complaints visual changes, ear ache CV:  chest pain, radiates: ,Orthopnea, PND, swelling in lower extremities, dizziness, palpitations, syncope.  GI  heartburn, indigestion, abdominal pain, nausea, vomiting, diarrhea, change in bowel habits, loss of appetite, bloody stools.  Resp: cough, productive: , hemoptysis, dyspnea, chest pain, pleuritc right flank, improved .  Skin: rash or itching or icterus GU: dysuria, change in color of urine, urgency or frequency. flank pain, hematuria  MS: joint pain or  swelling. decreased range of motion  Psych: change in mood or affect. depression or anxiety.  Neuro: difficulty with speech, weakness, numbness, ataxia   SUBJECTIVE:  No distress  VITAL SIGNS: Temp:  [98 F (36.7 C)-100.3 F (37.9 C)] 100.3 F (37.9 C) (12/19 1518) Pulse Rate:  [45-143] 71 (12/19 1645) Resp:  [17-27] 24 (12/19 1645) BP: (78-118)/(38-86) 92/63 mmHg (12/19 1645) SpO2:  [96 %-100 %] 97 % (12/19 1645) Weight:  [87.5 kg (192 lb 14.4 oz)-91.3 kg (201 lb 4.5  oz)] 87.5 kg (192 lb 14.4 oz) (12/19 1217) HEMODYNAMICS:   VENTILATOR SETTINGS:   INTAKE / OUTPUT:  Intake/Output Summary (Last 24 hours) at 08/15/14 1754 Last data filed at 08/15/14 1217  Gross per 24 hour  Intake  453.8 ml  Output   3200 ml  Net -2746.2 ml    PHYSICAL EXAMINATION: General:  63 year old male, in no acute distress.  Neuro:  Awake, alert, no focal def  HEENT:  [place], no JVd  Cardiovascular:  Tachy irreg  Lungs:  Clear, decreased in right, no accessory muscle use  Abdomen:  Non-tender + bowel sounds  Musculoskeletal:  Intact, + prior right AKA  Skin:  Intact   LABS:  CBC  Recent Labs Lab 08/13/14 0403 08/13/14 1402 08/15/14 0245  WBC 20.8* 14.7* 10.4  HGB 8.5* 8.1* 7.7*  HCT 26.7* 25.9* 23.9*  PLT 195 198 201   Coag's  Recent Labs Lab 08/13/14 1402 08/15/14 0245 08/15/14 1510  INR 4.32* 4.42* 1.80*   BMET  Recent Labs Lab 08/13/14 0403 08/13/14 1402 08/15/14 0245  NA 138 135* 130*  K 5.1 3.8 4.7  CL 96 93* 87*  CO2 21 24 23   BUN 55* 24* 45*  CREATININE 9.42* 4.79* 7.21*  GLUCOSE 186* 247* 242*   Electrolytes  Recent Labs Lab 08/13/14 0403 08/13/14 1402 08/15/14 0245  CALCIUM 9.0 8.6 8.4   Sepsis Markers No results for input(s): LATICACIDVEN, PROCALCITON, O2SATVEN in the last 168 hours. ABG No results for input(s): PHART, PCO2ART, PO2ART in the last 168 hours. Liver Enzymes  Recent Labs Lab 08/13/14 0403 08/15/14 0245  AST 34 34  ALT 20 24  ALKPHOS 168* 151*  BILITOT 1.5* 2.0*  ALBUMIN 2.5* 2.1*   Cardiac Enzymes  Recent Labs Lab 08/13/14 0403 08/13/14 1402 08/15/14 0245  TROPONINI <0.30 <0.30  --   PROBNP  --   --  >70000.0*   Glucose  Recent Labs Lab 08/14/14 0612 08/14/14 1156 08/14/14 1654 08/14/14 2131 08/15/14 1255 08/15/14 1516  GLUCAP 174* 207* 176* 151* 151* 202*    Imaging No results found.   ASSESSMENT / PLAN:  PULMONARY OETT  A: Loculated right pleural effusion. Old  hemothorax. Seen by thoracic surgery, likely now w/ secondary infection of pleural space  P:   Wean FIo2 pulm hygiene Diagnostic thora planned for 12/20 Thoracic surg following   CARDIOVASCULAR CVL A:  Hypotension/shock. Presume a mix of sepsis and hypovolemia s/p HD w/ net > 3 liters removed.  AF w/ RVR Known ICM w/ EF 15-20% P:  Repeat CBC Volume challenge w/ total 1.5 liters Ck lactic acid Ck cortisol  Neo if hypotensive  Cont tele See ID section   RENAL A:  ESRD P:   HD per renal   GASTROINTESTINAL A:  No acute  P:   Diet as tolerated   HEMATOLOGIC A:   Anemia of chronic disease Coumadin induced coagulopathy: s/p vit K  P:  Trend INR  Trend CBC May  consider IV heparin   INFECTIOUS A:  Septic shock in setting of probable infected loculated effusion from old hemothorax   P:   BCx2 12/17>>> Cefepime 12/17>>> vanc 12/17>>> Will eval for Diagnostic thora Will likely need VATS  ENDOCRINE A:  DM w/ hyperglycemia  P:   ssi protocol   NEUROLOGIC A:  No acute  P:   RASS goal: na Supportive care     TODAY'S SUMMARY:   63 year old male w/ complicated med hx: ESRD, type II DM, CABG/ MVR in 2008, CHF, cardiomyopathy (EF 15-20 %), atrial fibrillation, GI bleeds, SBP, pneumatosis coli,  c diff colitis. Admitted 12/17 w/ working dx of secondary infection of chronic loculated right effusion. Was being treated w/ ABX, correcting INR w/ Vit K w/ planned thoracentesis and possible VATS. On 12/19 PCCM was consulted for hypotension when he returned from HD w/ SBP in 70s. He is responding nicely to IVFs (BP and tachycardia has improved). Will Make SBP goal > 90. Cont tele, abx and supportive care.   Erick Colace ACNP-BC [place] Pager # [phone] OR # [phone] if no answer   08/15/2014, 5:54 PM  Attending Note:  I have examined patient, reviewed labs, studies and notes. I have discussed the case with Jerrye Bushy and I agree with the data  and plans as amended above. Will support him with fluid, await sampling of his loculated pleural collection   Baltazar Apo, MD, PhD [facility] [phone] or if no answer [phone]

## 2014-08-15 NOTE — Progress Notes (Addendum)
[facility] PROGRESS NOTE   Assessment/Plan: Pleural effusion, right/ Loculated pleural effusion/empyema: - hold coumadin allow diet. Follow PT/INR -  CT surgery appreciated,  -  infected old hemothorax versus empyema, will need thoracocentesis to see if the fluid is infected, if it is, it will need VATS versus percutaneous drainage - started empirically on vanc and cefepime. - Vented milligrams of IV vitamin K, will check INR, cost with radiology, INR will need to be below 2.5 prior to ultrasound-guided thoracocentesis.  Coagulopathy with supra therapeutic INR: -Secondary to warfarin, continue to hold especially in anticipation of procedure, given 10 mg of IV vitamin K today.   ESRD (end stage renal disease) on dialysis: - Renal consulted - usual HD TTS  DM type 2 causing ESRD - Lantus insulin and sliding scale of insulin. - cbg's ACHS.  Atrial fibrillation/Gilberti term current use of anticoagulant therapy: - Rate uncontrolled, still on amiodarone drip, will consult cardiology. - hold coumaidn.    Code Status: full Family Communication: Family at bedside Disposition Plan: inpatient   Consultants:  Renal  CT Surgery  Procedures:  Ct abd and pelvis  Antibiotics:  vanc and cefepime 12.17.2015.  HPI/Subjective: abd pain improved.  Objective: Filed Vitals:   08/15/14 1130 08/15/14 1200 08/15/14 1217 08/15/14 1259  BP: 96/62 94/46 100/60   Pulse: 119 115 114   Temp:   98.1 F (36.7 C) 98.3 F (36.8 C)  TempSrc:   Oral Oral  Resp:   18   Height:      Weight:   87.5 kg (192 lb 14.4 oz)   SpO2:   98%     Intake/Output Summary (Last 24 hours) at 08/15/14 1441 Last data filed at 08/15/14 1217  Gross per 24 hour  Intake  503.9 ml  Output   3200 ml  Net -2696.1 ml   Filed Weights   08/15/14 0300 08/15/14 0830 08/15/14 1217  Weight: 91.3 kg (201 lb 4.5 oz) 90.7 kg (199 lb 15.3 oz) 87.5 kg (192 lb 14.4 oz)    Exam:  General: Alert, awake,  oriented x3, in no acute distress.  HEENT: No bruits, no goiter.  Heart: Irregular irregular, no rubs, gallops. Lungs: Good air movement,  Abdomen: Soft, nontender, nondistended, positive bowel sounds.     Data Reviewed: Basic Metabolic Panel:  Recent Labs Lab 08/13/14 0403 08/13/14 1402 08/15/14 0245  NA 138 135* 130*  K 5.1 3.8 4.7  CL 96 93* 87*  CO2 21 24 23   GLUCOSE 186* 247* 242*  BUN 55* 24* 45*  CREATININE 9.42* 4.79* 7.21*  CALCIUM 9.0 8.6 8.4   Liver Function Tests:  Recent Labs Lab 08/13/14 0403 08/15/14 0245  AST 34 34  ALT 20 24  ALKPHOS 168* 151*  BILITOT 1.5* 2.0*  PROT 7.1 6.5  ALBUMIN 2.5* 2.1*   No results for input(s): LIPASE, AMYLASE in the last 168 hours. No results for input(s): AMMONIA in the last 168 hours. CBC:  Recent Labs Lab 08/13/14 0403 08/13/14 1402 08/15/14 0245  WBC 20.8* 14.7* 10.4  NEUTROABS 18.2* 12.5*  --   HGB 8.5* 8.1* 7.7*  HCT 26.7* 25.9* 23.9*  MCV 97.4 98.5 91.6  PLT 195 198 201   Cardiac Enzymes:  Recent Labs Lab 08/13/14 0403 08/13/14 1402  TROPONINI <0.30 <0.30   BNP (last 3 results)  Recent Labs  08/15/14 0245  PROBNP >70000.0*   CBG:  Recent Labs Lab 08/14/14 0612 08/14/14 1156 08/14/14 1654 08/14/14 2131 08/15/14 [place]  174* 207* 176* 151* 151*    Recent Results (from the past 240 hour(s))  Blood culture (routine x 2)     Status: None (Preliminary result)   Collection Time: 08/13/14  5:45 PM  Result Value Ref Range Status   Specimen Description BLOOD LEFT HAND  Final   Special Requests BOTTLES DRAWN AEROBIC ONLY 4CC  Final   Culture NO GROWTH 2 DAYS  Final   Report Status PENDING  Incomplete  Blood culture (routine x 2)     Status: None (Preliminary result)   Collection Time: 08/13/14  5:55 PM  Result Value Ref Range Status   Specimen Description BLOOD LEFT ARM  Final   Special Requests   Final    BOTTLES DRAWN AEROBIC AND ANAEROBIC AEB=8CC ANA=6CC   Culture NO GROWTH  2 DAYS  Final   Report Status PENDING  Incomplete  MRSA PCR Screening     Status: Abnormal   Collection Time: 08/14/14  2:05 AM  Result Value Ref Range Status   MRSA by PCR POSITIVE (A) NEGATIVE Final    Comment:        The GeneXpert MRSA Assay (FDA approved for NASAL specimens only), is one component of a comprehensive MRSA colonization surveillance program. It is not intended to diagnose MRSA infection nor to guide or monitor treatment for MRSA infections. RESULT CALLED TO, READ BACK BY AND VERIFIED WITH: D RICE,RN [id number] [facility]      Studies: Dg Chest 2 View  08/13/2014   CLINICAL DATA:  Weakness and abdominal pain.  Diabetes.  HTN.  EXAM: CHEST  2 VIEW  COMPARISON:  CT chest today  FINDINGS: Right pleural effusion, loculated on CT. Right lower lobe atelectasis or infiltrate. This could represent pneumonia and empyema. Correlate with fever and white count.  Cardiac enlargement with mild vascular congestion. No edema. Left lung clear.  Stent in the right upper lobe pulmonary artery of uncertain significance. This is unchanged from a CT of 12/06/2011.  IMPRESSION: Cardiac enlargement with vascular congestion.  Negative for edema.  Right pleural effusion and right lower lobe airspace disease. Possible pneumonia. Correlate with clinical symptoms.   Electronically Signed   By: Franchot Gallo M.D.   On: 08/13/2014 15:37   Ct Head Wo Contrast  08/13/2014   CLINICAL DATA:  Altered mental status and low-grade fever  EXAM: CT HEAD WITHOUT CONTRAST  TECHNIQUE: Contiguous axial images were obtained from the base of the skull through the vertex without intravenous contrast.  COMPARISON:  February 11, 2011  FINDINGS: Moderate diffuse atrophy is stable. Prominence of the cisterna magna is an anatomic variant. There is no demonstrable intracranial mass, acute hemorrhage, extra-axial fluid collection, or midline shift. There is evidence of a prior infarct in the mid left parietal lobe, stable. There  is evidence of a prior infarct in the mid portion of the left external capsule, stable. A small focus of calcification in this area may represent residua of prior hemorrhage. There is patchy small vessel disease throughout the centra semiovale bilaterally. There is no new gray-white compartment lesion. No acute appearing infarct appreciable. The bony calvarium appears intact. The mastoid air cells are clear.  IMPRESSION: Atrophy with prior infarcts and small vessel disease, stable. No acute hemorrhage. No mass or extra-axial fluid collection. No acute appearing infarct.   Electronically Signed   By: Lowella Grip M.D.   On: 08/13/2014 15:08   Ct Abdomen Pelvis W Contrast  08/13/2014   CLINICAL DATA:  Weakness.  Dialysis  patient.  Diabetes.  EXAM: CT ABDOMEN AND PELVIS WITH CONTRAST  TECHNIQUE: Multidetector CT imaging of the abdomen and pelvis was performed using the standard protocol following bolus administration of intravenous contrast.  CONTRAST:  156mL OMNIPAQUE IOHEXOL 300 MG/ML  SOLN  COMPARISON:  CT abdomen pelvis 02/27/2014  FINDINGS: Loculated right lower pleural effusion measures 9 x 15 cm. This has enhancing wall. There was high-density in this fluid on the prior CT and this may be an organizing hemothorax. Empyema not excluded. If the patient has fever and white count, thoracentesis recommended. There is compressive atelectasis in the right lower lobe. Calcified granuloma left lower lobe unchanged. Small nodule left lower lobe not present but this area was not scanned today.  Cardiac enlargement with coronary artery calcification and CABG.  Liver and spleen normal. Small calcified gallstones. Gallbladder is contracted and not thickened. Bile ducts nondilated. Pancreas and spleen are normal.  Small kidneys due to chronic renal failure. No renal mass or hydronephrosis.  Negative for bowel obstruction. No bowel thickening. Negative for diverticulitis. Normal appendix. Resolution of thickening of  the cecum compared with the prior study which may be resolution of colitis. No underlying mass.  Advanced atherosclerotic disease without aortic aneurysm.  Chronic compression fracture L1 unchanged. Mild chronic fracture L4 unchanged. No acute spinal abnormality.  IMPRESSION: Large loculated right pleural effusion. This may represent contracting hemothorax based on the prior CT. If the patient has a fever or white count, thoracentesis recommended to exclude empyema.  Calcified gallstones.  Normal appendix.  Resolution of cecal thickening since the prior CT.   Electronically Signed   By: Franchot Gallo M.D.   On: 08/13/2014 15:18    Scheduled Meds: . allopurinol  100 mg Oral Daily  . amiodarone  200 mg Oral BID  . atorvastatin  20 mg Oral QPM  . ceFEPime (MAXIPIME) IV  2 g Intravenous Q T,Th,Sa-HD  . cinacalcet  30 mg Oral Q breakfast  . folic acid  1 mg Oral Daily  . insulin aspart  0-5 Units Subcutaneous QHS  . insulin aspart  0-9 Units Subcutaneous TID WC  . insulin glargine  10 Units Subcutaneous QHS  . metoprolol  75 mg Oral BID  . midodrine      . midodrine  5 mg Oral TID AC  . pantoprazole  40 mg Oral BID  . phytonadione (VITAMIN K) IV  5 mg Intravenous Once  . sevelamer carbonate  800 mg Oral TID WC  . sodium chloride  3 mL Intravenous Q12H  . sodium chloride  3 mL Intravenous Q12H  . traZODone  50 mg Oral QHS  . vancomycin  1,000 mg Intravenous Q T,Th,Sa-HD   Continuous Infusions: . amiodarone 30 mg/hr (08/15/14 0803)     [facility], [place]  [facility] Pager [phone]. If 8PM-8AM, please contact night-coverage at [email or web address], password [facility] 08/15/2014, 2:41 PM  LOS: 2 days

## 2014-08-15 NOTE — Procedures (Signed)
I was present at this dialysis session, have reviewed the session itself and made  appropriate changes  Kelly Splinter MD (pgr) [phone]    ([phone] 08/15/2014, 8:58 AM

## 2014-08-16 ENCOUNTER — Inpatient Hospital Stay (HOSPITAL_COMMUNITY): Payer: Medicare Other

## 2014-08-16 DIAGNOSIS — J189 Pneumonia, unspecified organism: Secondary | ICD-10-CM | POA: Insufficient documentation

## 2014-08-16 DIAGNOSIS — Z9889 Other specified postprocedural states: Secondary | ICD-10-CM | POA: Insufficient documentation

## 2014-08-16 LAB — CBC
HEMATOCRIT: 24.5 % — AB (ref 39.0–52.0)
Hemoglobin: 8 g/dL — ABNORMAL LOW (ref 13.0–17.0)
MCH: 30.2 pg (ref 26.0–34.0)
MCHC: 32.7 g/dL (ref 30.0–36.0)
MCV: 92.5 fL (ref 78.0–100.0)
PLATELETS: 194 10*3/uL (ref 150–400)
RBC: 2.65 MIL/uL — ABNORMAL LOW (ref 4.22–5.81)
RDW: 16.4 % — AB (ref 11.5–15.5)
WBC: 9 10*3/uL (ref 4.0–10.5)

## 2014-08-16 LAB — BODY FLUID CELL COUNT WITH DIFFERENTIAL
Lymphs, Fluid: 11 %
Monocyte-Macrophage-Serous Fluid: 10 % — ABNORMAL LOW (ref 50–90)
NEUTROPHIL FLUID: 79 % — AB (ref 0–25)
Total Nucleated Cell Count, Fluid: 4076 cu mm — ABNORMAL HIGH (ref 0–1000)

## 2014-08-16 LAB — GLUCOSE, CAPILLARY
Glucose-Capillary: 170 mg/dL — ABNORMAL HIGH (ref 70–99)
Glucose-Capillary: 141 mg/dL — ABNORMAL HIGH (ref 70–99)
Glucose-Capillary: 176 mg/dL — ABNORMAL HIGH (ref 70–99)
Glucose-Capillary: 202 mg/dL — ABNORMAL HIGH (ref 70–99)

## 2014-08-16 LAB — PREPARE RBC (CROSSMATCH)

## 2014-08-16 LAB — PH, BODY FLUID: pH, Fluid: 8

## 2014-08-16 LAB — LACTATE DEHYDROGENASE, PLEURAL OR PERITONEAL FLUID: LD, Fluid: 10 U/L (ref 3–23)

## 2014-08-16 LAB — LACTATE DEHYDROGENASE: LDH: 177 U/L (ref 94–250)

## 2014-08-16 LAB — CORTISOL: CORTISOL PLASMA: 34.3 ug/dL

## 2014-08-16 LAB — PROTEIN, BODY FLUID: TOTAL PROTEIN, FLUID: 4.8 g/dL

## 2014-08-16 LAB — PROTEIN, TOTAL: Total Protein: 6.6 g/dL (ref 6.0–8.3)

## 2014-08-16 MED ORDER — HEPARIN SODIUM (PORCINE) 1000 UNIT/ML DIALYSIS: 1000.0000 [IU] | INTRAMUSCULAR | Status: DC | PRN

## 2014-08-16 MED ORDER — LIDOCAINE HCL (PF) 1 % IJ SOLN: 5.0000 mL | INTRAMUSCULAR | Status: DC | PRN

## 2014-08-16 MED ORDER — LIDOCAINE HCL (PF) 1 % IJ SOLN
INTRAMUSCULAR | Status: AC
  Filled 2014-08-16: qty 10

## 2014-08-16 MED ORDER — PENTAFLUOROPROP-TETRAFLUOROETH EX AERO: 1.0000 "application " | INHALATION_SPRAY | CUTANEOUS | Status: DC | PRN

## 2014-08-16 MED ORDER — NEPRO/CARBSTEADY PO LIQD
237.0000 mL | ORAL | Status: DC | PRN
  Filled 2014-08-16: qty 237

## 2014-08-16 MED ORDER — ALTEPLASE 2 MG IJ SOLR
2.0000 mg | Freq: Once | INTRAMUSCULAR | Status: DC | PRN
  Filled 2014-08-16: qty 2

## 2014-08-16 MED ORDER — LIDOCAINE-PRILOCAINE 2.5-2.5 % EX CREA: 1.0000 "application " | TOPICAL_CREAM | CUTANEOUS | Status: DC | PRN

## 2014-08-16 MED ORDER — NEPRO/CARBSTEADY PO LIQD: 237.0000 mL | ORAL | Status: DC | PRN

## 2014-08-16 MED ORDER — MIDODRINE HCL 5 MG PO TABS
10.0000 mg | ORAL_TABLET | Freq: Two times a day (BID) | ORAL | Status: DC
  Administered 2014-08-16 – 2014-08-20 (×10): 10 mg via ORAL
  Filled 2014-08-16 (×12): qty 2

## 2014-08-16 MED ORDER — SODIUM CHLORIDE 0.9 % IV SOLN: 100.0000 mL | INTRAVENOUS | Status: DC | PRN

## 2014-08-16 MED ORDER — LIDOCAINE-PRILOCAINE 2.5-2.5 % EX CREA
1.0000 "application " | TOPICAL_CREAM | CUTANEOUS | Status: DC | PRN
  Filled 2014-08-16: qty 5

## 2014-08-16 MED ORDER — HEPARIN SODIUM (PORCINE) 5000 UNIT/ML IJ SOLN: 5000.0000 [IU] | Freq: Three times a day (TID) | INTRAMUSCULAR | Status: DC

## 2014-08-16 MED ORDER — SODIUM CHLORIDE 0.9 % IV SOLN
Freq: Once | INTRAVENOUS | Status: AC
  Administered 2014-08-16: 18:00:00 via INTRAVENOUS

## 2014-08-16 MED ORDER — ALTEPLASE 2 MG IJ SOLR: 2.0000 mg | Freq: Once | INTRAMUSCULAR | Status: DC | PRN

## 2014-08-16 MED ORDER — SODIUM CHLORIDE 0.9 % IV BOLUS (SEPSIS)
250.0000 mL | Freq: Once | INTRAVENOUS | Status: AC
  Administered 2014-08-16: 250 mL via INTRAVENOUS

## 2014-08-16 MED ORDER — HEPARIN SODIUM (PORCINE) 5000 UNIT/ML IJ SOLN
5000.0000 [IU] | Freq: Three times a day (TID) | INTRAMUSCULAR | Status: DC
  Administered 2014-08-16 – 2014-08-17 (×2): 5000 [IU] via SUBCUTANEOUS
  Filled 2014-08-16 (×5): qty 1

## 2014-08-16 NOTE — Progress Notes (Signed)
PCCM Interval Note  Pt responded to volume last night, now downstairs to get his thoracentesis. Await results - need to r/o empyema. We will follow with you  Baltazar Apo, MD, PhD 08/16/2014, 10:41 AM [facility] [phone] or if no answer [phone]

## 2014-08-16 NOTE — Procedures (Signed)
I was present at this dialysis session, have reviewed the session itself and made  appropriate changes  Kelly Splinter MD (pgr) [phone]    ([phone] 08/16/2014, 12:32 PM

## 2014-08-16 NOTE — Progress Notes (Signed)
[facility] PROGRESS NOTE   Admission history of present illness/brief narrative: This is a 63 year old male w/ complicated med hx: ESRD, type II DM, CABG/ MVR in 2008, CHF, cardiomyopathy (EF 15-20 %), atrial fibrillation, GI bleeds, SBP, pneumatosis coli, c diff colitis. Admitted 12/17 w/ working dx of secondary infection of chronic loculated right effusion. Was being treated w/ ABX IV vancomycin and cefepime since admission 12/17, seen by CT surgery, initially with supratherapeutic INR corrected w/ Vit K w/ planned thoracentesis and possible VATS.  Patient hospital stay complicated by hypotension on 12/19 after hemodialysis, felt to be septic/cardiac/hypovolemic shock, as well A. fib with RVR initially on Cardizem drip, but change to  amiodarone drip secondary to hypotension. Assessment/Plan: Pleural effusion, right/ Loculated pleural effusion/empyema: - Patient underwent thoracocentesis 12/20 with 1 L drained. -Workup still pending. -  CT surgery appreciated,  -  infected old hemothorax versus empyema, possible VATS versus percutaneous drainage as per CT surgery. - empirically on vanc and cefepime 12/17.  Hypotension: - Appears multifactorial cardiac, sepsis, bulimia. -Discussed with nephrology, patient is usually hypotensive after hemodialysis, that's why he is on midodrine, can't tolerate systolic blood pressure in the low 80s and high 70s as [place] patient is asymptomatic.  Coagulopathy with supra therapeutic INR: -Secondary to warfarin.  ESRD (end stage renal disease) on dialysis: - Renal consulted   DM type 2 causing ESRD - Lantus insulin and sliding scale of insulin. - cbg's ACHS.  Atrial fibrillation/Gerdeman term current use of anticoagulant therapy: - still on amiodarone drip, cardiology consult appreciated. - Continue to hold warfarin for possible need for surgical intervention, start subcutaneous heparin whenever INR is subtherapeutic.    Code Status:  full Family Communication: None at bedside Disposition Plan: Remains in stepdown   Consultants:  Renal  CT Surgery  PCCM  Procedures:  Ct abd and pelvis  Thoracocentesis with 1 L drained 12/20  Antibiotics:  vanc and cefepime 12.17.2015.  HPI/Subjective: abd pain improved.  Objective: Filed Vitals:   08/16/14 0600 08/16/14 0700 08/16/14 0822 08/16/14 0934  BP: 94/47 100/50  96/51  Pulse: 45 46    Temp:   98 F (36.7 C)   TempSrc:   Oral   Resp: 22 18    Height:      Weight:      SpO2: 99% 100%      Intake/Output Summary (Last 24 hours) at 08/16/14 1026 Last data filed at 08/16/14 0600  Gross per 24 hour  Intake    334 ml  Output   3200 ml  Net  -2866 ml   Filed Weights   08/15/14 0830 08/15/14 1217 08/16/14 0403  Weight: 90.7 kg (199 lb 15.3 oz) 87.5 kg (192 lb 14.4 oz) 89.2 kg (196 lb 10.4 oz)    Exam:  General: Alert, awake, oriented x3, in no acute distress.  HEENT: No bruits, no goiter.  Heart: Irregular irregular, no rubs, gallops. Lungs: Good air movement,  Abdomen: Soft, nontender, nondistended, positive bowel sounds.     Data Reviewed: Basic Metabolic Panel:  Recent Labs Lab 08/13/14 0403 08/13/14 1402 08/15/14 0245  NA 138 135* 130*  K 5.1 3.8 4.7  CL 96 93* 87*  CO2 21 24 23   GLUCOSE 186* 247* 242*  BUN 55* 24* 45*  CREATININE 9.42* 4.79* 7.21*  CALCIUM 9.0 8.6 8.4   Liver Function Tests:  Recent Labs Lab 08/13/14 0403 08/15/14 0245  AST 34 34  ALT 20 24  ALKPHOS 168* 151*  BILITOT 1.5* 2.0*  PROT 7.1 6.5  ALBUMIN 2.5* 2.1*   No results for input(s): LIPASE, AMYLASE in the last 168 hours. No results for input(s): AMMONIA in the last 168 hours. CBC:  Recent Labs Lab 08/13/14 0403 08/13/14 1402 08/15/14 0245 08/16/14 0430  WBC 20.8* 14.7* 10.4 9.0  NEUTROABS 18.2* 12.5*  --   --   HGB 8.5* 8.1* 7.7* 8.0*  HCT 26.7* 25.9* 23.9* 24.5*  MCV 97.4 98.5 91.6 92.5  PLT 195 198 201 194   Cardiac  Enzymes:  Recent Labs Lab 08/13/14 0403 08/13/14 1402  TROPONINI <0.30 <0.30   BNP (last 3 results)  Recent Labs  08/15/14 0245  PROBNP >70000.0*   CBG:  Recent Labs Lab 08/14/14 2131 08/15/14 1255 08/15/14 1516 08/15/14 2103 08/16/14 0813  GLUCAP 151* 151* 202* 217* 170*    Recent Results (from the past 240 hour(s))  Blood culture (routine x 2)     Status: None (Preliminary result)   Collection Time: 08/13/14  5:45 PM  Result Value Ref Range Status   Specimen Description BLOOD LEFT HAND  Final   Special Requests BOTTLES DRAWN AEROBIC ONLY 4CC  Final   Culture NO GROWTH 3 DAYS  Final   Report Status PENDING  Incomplete  Blood culture (routine x 2)     Status: None (Preliminary result)   Collection Time: 08/13/14  5:55 PM  Result Value Ref Range Status   Specimen Description BLOOD LEFT ARM  Final   Special Requests   Final    BOTTLES DRAWN AEROBIC AND ANAEROBIC AEB=8CC ANA=6CC   Culture NO GROWTH 3 DAYS  Final   Report Status PENDING  Incomplete  MRSA PCR Screening     Status: Abnormal   Collection Time: 08/14/14  2:05 AM  Result Value Ref Range Status   MRSA by PCR POSITIVE (A) NEGATIVE Final    Comment:        The GeneXpert MRSA Assay (FDA approved for NASAL specimens only), is one component of a comprehensive MRSA colonization surveillance program. It is not intended to diagnose MRSA infection nor to guide or monitor treatment for MRSA infections. RESULT CALLED TO, READ BACK BY AND VERIFIED WITH: D RICE,RN Anza      Studies: No results found.  Scheduled Meds: . allopurinol  100 mg Oral Daily  . atorvastatin  20 mg Oral QPM  . ceFEPime (MAXIPIME) IV  2 g Intravenous Q T,Th,Sa-HD  . cinacalcet  30 mg Oral Q breakfast  . folic acid  1 mg Oral Daily  . insulin aspart  0-5 Units Subcutaneous QHS  . insulin aspart  0-9 Units Subcutaneous TID WC  . insulin glargine  10 Units Subcutaneous QHS  . metoprolol  75 mg Oral BID  .  midodrine  10 mg Oral BID WC  . pantoprazole  40 mg Oral BID  . phytonadione (VITAMIN K) IV  5 mg Intravenous Once  . sevelamer carbonate  800 mg Oral TID WC  . sodium chloride  3 mL Intravenous Q12H  . sodium chloride  3 mL Intravenous Q12H  . traZODone  50 mg Oral QHS  . vancomycin  1,000 mg Intravenous Q T,Th,Sa-HD   Continuous Infusions: . amiodarone 30 mg/hr (08/16/14 0600)  . norepinephrine (LEVOPHED) Adult infusion     Time spent: 35 minutes  Sharone Picchi  [facility] Pager [phone]. If 8PM-8AM, please contact night-coverage at [email or web address], password [facility] 08/16/2014, 10:26 AM  LOS: 3 days

## 2014-08-16 NOTE — Progress Notes (Signed)
Dr. Waldron Labs notified of blood pressure 68/46, HR in the 140's. Dr. Waldron Labs will contact nephrology. Will continue to monitor. Patient is calm and comfortable at this time. Roselyn Reef Jancarlos Thrun,RN

## 2014-08-16 NOTE — Progress Notes (Signed)
DAILY PROGRESS NOTE  Subjective:  HR better controlled today, now in the 100-110 range. S/p thoracentesis earlier which revealed bloody fluid. Now seen on dialysis with a UF goal of 3500 cc. BP 106/61.  Objective:  Temp:  [98 F (36.7 C)-100.3 F (37.9 C)] 98 F (36.7 C) (12/20 0822) Pulse Rate:  [37-143] 70 (12/20 1130) Resp:  [11-27] 25 (12/20 1040) BP: (63-118)/(38-92) 118/92 mmHg (12/20 1130) SpO2:  [96 %-100 %] 99 % (12/20 1040) Weight:  [192 lb 14.4 oz (87.5 kg)-196 lb 10.4 oz (89.2 kg)] 193 lb 9 oz (87.8 kg) (12/20 1040) Weight change: -1 lb 5.2 oz (-0.6 kg)  Intake/Output from previous day: 12/19 0701 - 12/20 0700 In: 384.1 [I.V.:384.1] Out: 3200   Intake/Output from this shift:    Medications: Current Facility-Administered Medications  Medication Dose Route Frequency Provider Last Rate Last Dose  . 0.9 %  sodium chloride infusion  100 mL Intravenous PRN Sol Blazing, MD      . 0.9 %  sodium chloride infusion  100 mL Intravenous PRN Sol Blazing, MD      . albuterol (PROVENTIL) (2.5 MG/3ML) 0.083% nebulizer solution 2.5 mg  2.5 mg Nebulization Q2H PRN Nimish C Gosrani, MD      . allopurinol (ZYLOPRIM) tablet 100 mg  100 mg Oral Daily Nimish C Gosrani, MD   100 mg at 08/14/14 1100  . alteplase (CATHFLO ACTIVASE) injection 2 mg  2 mg Intracatheter Once PRN Sol Blazing, MD      . amiodarone (NEXTERONE PREMIX) 360 MG/200ML (1.8 mg/mL) IV infusion  30 mg/hr Intravenous Continuous Gardiner Barefoot, NP 16.7 mL/hr at 08/16/14 0600 30 mg/hr at 08/16/14 0600  . atorvastatin (LIPITOR) tablet 20 mg  20 mg Oral QPM Nimish C Gosrani, MD   20 mg at 08/15/14 1807  . ceFEPIme (MAXIPIME) 2 g in dextrose 5 % 50 mL IVPB  2 g Intravenous Q T,Th,Sa-HD Biagio Borg, RPH   2 g at 08/15/14 0955  . cinacalcet (SENSIPAR) tablet 30 mg  30 mg Oral Q breakfast Nimish C Anastasio Champion, MD   30 mg at 08/16/14 0844  . feeding supplement (NEPRO CARB STEADY) liquid 237 mL  237  mL Oral PRN Sol Blazing, MD      . folic acid (FOLVITE) tablet 1 mg  1 mg Oral Daily Nimish C Gosrani, MD   1 mg at 08/14/14 1100  . heparin injection 1,000 Units  1,000 Units Dialysis PRN Sol Blazing, MD      . HYDROcodone-acetaminophen (NORCO/VICODIN) 5-325 MG per tablet 1 tablet  1 tablet Oral Q6H PRN Nimish Luther Parody, MD      . insulin aspart (novoLOG) injection 0-5 Units  0-5 Units Subcutaneous QHS Doree Albee, MD   2 Units at 08/15/14 2111  . insulin aspart (novoLOG) injection 0-9 Units  0-9 Units Subcutaneous TID WC Doree Albee, MD   2 Units at 08/16/14 0844  . insulin glargine (LANTUS) injection 10 Units  10 Units Subcutaneous QHS Doree Albee, MD   10 Units at 08/15/14 2112  . lidocaine (PF) (XYLOCAINE) 1 % injection 5 mL  5 mL Intradermal PRN Sol Blazing, MD      . lidocaine-prilocaine (EMLA) cream 1 application  1 application Topical PRN Sol Blazing, MD      . metoprolol tartrate (LOPRESSOR) tablet 75 mg  75 mg Oral BID Gardiner Barefoot, NP   75 mg at 08/15/14 2112  .  midodrine (PROAMATINE) tablet 10 mg  10 mg Oral BID WC Sol Blazing, MD   10 mg at 08/16/14 0844  . norepinephrine (LEVOPHED) 16 mg in dextrose 5 % 250 mL (0.064 mg/mL) infusion  0-40 mcg/min Intravenous Titrated Phillips Climes, MD   0 mcg/min at 08/15/14 1630  . ondansetron (ZOFRAN) tablet 4 mg  4 mg Oral Q6H PRN Nimish C Gosrani, MD       Or  . ondansetron (ZOFRAN) injection 4 mg  4 mg Intravenous Q6H PRN Nimish Luther Parody, MD   4 mg at 08/14/14 1723  . pantoprazole (PROTONIX) EC tablet 40 mg  40 mg Oral BID Doree Albee, MD   40 mg at 08/15/14 2112  . pentafluoroprop-tetrafluoroeth (GEBAUERS) aerosol 1 application  1 application Topical PRN Sol Blazing, MD      . phytonadione (VITAMIN K) 5 mg in dextrose 5 % 50 mL IVPB  5 mg Intravenous Once Doree Albee, MD   5 mg at 08/16/14 0736  . sevelamer carbonate (RENVELA) tablet 800 mg  800 mg Oral TID WC Nimish C Anastasio Champion,  MD   800 mg at 08/16/14 0844  . sodium chloride 0.9 % injection 3 mL  3 mL Intravenous Q12H Nimish C Gosrani, MD   3 mL at 08/14/14 1052  . traMADol (ULTRAM) tablet 50 mg  50 mg Oral Q8H PRN Nimish C Gosrani, MD      . traZODone (DESYREL) tablet 50 mg  50 mg Oral QHS Doree Albee, MD   50 mg at 08/15/14 2112  . vancomycin (VANCOCIN) IVPB 1000 mg/200 mL premix  1,000 mg Intravenous Q T,Th,Sa-HD Lavonia Drafts Lilliston, RPH   1,000 mg at 08/15/14 1116    Physical Exam: General appearance: alert and no distress Lungs: diminished breath sounds RLL, RML and RUL Heart: irregularly irregular rhythm Extremities: edema trace LLE edema, right BKA  Lab Results: Results for orders placed or performed during the hospital encounter of 08/13/14 (from the past 48 hour(s))  Glucose, capillary     Status: Abnormal   Collection Time: 08/14/14  4:54 PM  Result Value Ref Range   Glucose-Capillary 176 (H) 70 - 99 mg/dL   Comment 1 Capillary Sample   Glucose, capillary     Status: Abnormal   Collection Time: 08/14/14  9:31 PM  Result Value Ref Range   Glucose-Capillary 151 (H) 70 - 99 mg/dL   Comment 1 Capillary Sample   Comprehensive metabolic panel     Status: Abnormal   Collection Time: 08/15/14  2:45 AM  Result Value Ref Range   Sodium 130 (L) 137 - 147 mEq/L   Potassium 4.7 3.7 - 5.3 mEq/L   Chloride 87 (L) 96 - 112 mEq/L   CO2 23 19 - 32 mEq/L   Glucose, Bld 242 (H) 70 - 99 mg/dL   BUN 45 (H) 6 - 23 mg/dL   Creatinine, Ser 7.21 (H) 0.50 - 1.35 mg/dL   Calcium 8.4 8.4 - 10.5 mg/dL   Total Protein 6.5 6.0 - 8.3 g/dL   Albumin 2.1 (L) 3.5 - 5.2 g/dL   AST 34 0 - 37 U/L   ALT 24 0 - 53 U/L   Alkaline Phosphatase 151 (H) 39 - 117 U/L   Total Bilirubin 2.0 (H) 0.3 - 1.2 mg/dL   GFR calc non Af Amer 7 (L) >90 mL/min   GFR calc Af Amer 8 (L) >90 mL/min    Comment: (NOTE) The eGFR has been calculated  using the CKD EPI equation. This calculation has not been validated in all clinical  situations. eGFR's persistently <90 mL/min signify possible Chronic Kidney Disease.    Anion gap 20 (H) 5 - 15  CBC     Status: Abnormal   Collection Time: 08/15/14  2:45 AM  Result Value Ref Range   WBC 10.4 4.0 - 10.5 K/uL   RBC 2.61 (L) 4.22 - 5.81 MIL/uL   Hemoglobin 7.7 (L) 13.0 - 17.0 g/dL   HCT 23.9 (L) 39.0 - 52.0 %   MCV 91.6 78.0 - 100.0 fL   MCH 29.5 26.0 - 34.0 pg   MCHC 32.2 30.0 - 36.0 g/dL   RDW 16.5 (H) 11.5 - 15.5 %   Platelets 201 150 - 400 K/uL  Protime-INR     Status: Abnormal   Collection Time: 08/15/14  2:45 AM  Result Value Ref Range   Prothrombin Time 42.5 (H) 11.6 - 15.2 seconds   INR 4.42 (H) 0.00 - 1.49  Pro b natriuretic peptide     Status: Abnormal   Collection Time: 08/15/14  2:45 AM  Result Value Ref Range   Pro B Natriuretic peptide (BNP) >70000.0 (H) 0 - 125 pg/mL  Hepatitis B surface antigen     Status: None   Collection Time: 08/15/14  8:53 AM  Result Value Ref Range   Hepatitis B Surface Ag NEGATIVE NEGATIVE    Comment: Performed at [facility]  Glucose, capillary     Status: Abnormal   Collection Time: 08/15/14 12:55 PM  Result Value Ref Range   Glucose-Capillary 151 (H) 70 - 99 mg/dL  Protime-INR     Status: Abnormal   Collection Time: 08/15/14  3:10 PM  Result Value Ref Range   Prothrombin Time 21.1 (H) 11.6 - 15.2 seconds   INR 1.80 (H) 0.00 - 1.49  Glucose, capillary     Status: Abnormal   Collection Time: 08/15/14  3:16 PM  Result Value Ref Range   Glucose-Capillary 202 (H) 70 - 99 mg/dL  Lactic acid, plasma     Status: None   Collection Time: 08/15/14  7:07 PM  Result Value Ref Range   Lactic Acid, Venous 1.6 0.5 - 2.2 mmol/L  Glucose, capillary     Status: Abnormal   Collection Time: 08/15/14  9:03 PM  Result Value Ref Range   Glucose-Capillary 217 (H) 70 - 99 mg/dL   Comment 1 Capillary Sample   Lactate dehydrogenase     Status: None   Collection Time: 08/16/14  4:30 AM  Result Value Ref Range   LDH 177 94 -  250 U/L  CBC     Status: Abnormal   Collection Time: 08/16/14  4:30 AM  Result Value Ref Range   WBC 9.0 4.0 - 10.5 K/uL   RBC 2.65 (L) 4.22 - 5.81 MIL/uL   Hemoglobin 8.0 (L) 13.0 - 17.0 g/dL   HCT 24.5 (L) 39.0 - 52.0 %   MCV 92.5 78.0 - 100.0 fL   MCH 30.2 26.0 - 34.0 pg   MCHC 32.7 30.0 - 36.0 g/dL   RDW 16.4 (H) 11.5 - 15.5 %   Platelets 194 150 - 400 K/uL  Glucose, capillary     Status: Abnormal   Collection Time: 08/16/14  8:13 AM  Result Value Ref Range   Glucose-Capillary 170 (H) 70 - 99 mg/dL    Imaging: Dg Chest 1 View  08/16/2014   CLINICAL DATA:  Status post thoracentesis  EXAM: CHEST -  1 VIEW  COMPARISON:  August 13, 2014  FINDINGS: There is no appreciable pneumothorax. There is less effusion on the right compared to recent prior study. There is asymmetric edema in the right lung with patchy consolidation in the right mid and lower lung zones. On the left, there is atelectatic change in the lateral base region. There is a slight degree of interstitial edema on the left. Heart is enlarged. Patient is status post mitral valve replacement as well as coronary artery bypass grafting. There is a degree of pulmonary venous hypertension. No adenopathy.  IMPRESSION: Less effusion on the right compared to recent prior study. There is right lower lobe consolidation. There is a degree of underlying congestive heart failure. There is somewhat more edema on the right than on the left. No pneumothorax.   Electronically Signed   By: Lowella Grip M.D.   On: 08/16/2014 11:01   US Thoracentesis Asp Pleural Space W/img Guide  08/16/2014   INDICATION: Patient with history of end-stage renal disease, CHF, chronic right effusion. Request is made for diagnostic and therapeutic right thoracentesis.  EXAM: ULTRASOUND GUIDED DIAGNOSTIC AND THERAPEUTIC RIGHT THORACENTESIS  COMPARISON:  None.  MEDICATIONS: None  COMPLICATIONS: None immediate  TECHNIQUE: Informed written consent was obtained from  the patient after a discussion of the risks, benefits and alternatives to treatment. A timeout was performed prior to the initiation of the procedure.  Initial ultrasound scanning demonstrates a moderate-to-large right pleural effusion. The lower chest was prepped and draped in the usual sterile fashion. 1% lidocaine was used for local anesthesia.  Under direct ultrasound guidance, a 19 gauge, 7-cm, Yueh catheter was introduced. An ultrasound image was saved for documentation purposes. The thoracentesis was performed. The catheter was removed and a dressing was applied. The patient tolerated the procedure well without immediate post procedural complication. The patient was escorted to have an upright chest radiograph.  FINDINGS: A total of approximately 1 liter of thick, dark bloody fluid was removed. Only the above amount of fluid could be aspirated at this time. Requested samples were sent to the laboratory.  IMPRESSION: Successful ultrasound-guided diagnostic and therapeutic right sided thoracentesis yielding 1 liter of pleural fluid.  Read by: Rowe Robert, PA-C   Electronically Signed   By: Markus Daft M.D.   On: 08/16/2014 10:31    Assessment:  1. Principal Problem: 2.   Loculated pleural effusion 3. Active Problems: 4.   Atrial fibrillation 5.   [place] term current use of anticoagulant therapy 6.   ESRD (end stage renal disease) on dialysis 7.   DM type 2 causing ESRD 8.   Septic shock 9.   Pleural effusion, right 10.   Atrial fibrillation with rapid ventricular response 11.   Absolute anemia 12.   Plan:  1. Continue IV amiodarone. Suspect his has valvular AF - modified atrial substrate with prior MAZE, will be difficult to control. Once he is optimized from volume and pulmonary standpoint, may wish to consider TEE-guided cardioversion, which should help cardiac output. Must be adequately loaded on amiodarone prior to this for any chance of success.  Time Spent Directly with Patient:  15  minutes  Length of Stay:  LOS: 3 days   Pixie Casino, MD, [facility] Attending Cardiologist [facility]  Shina Wass C 08/16/2014, 12:02 PM

## 2014-08-16 NOTE — Progress Notes (Signed)
[facility] for Vancomycin & Cefepime Indication: rule out sepsis  Allergies  Allergen Reactions  . Bacitracin Other (See Comments)    unknown  . Norvasc [Amlodipine Besylate] Other (See Comments)    unknown    Patient Measurements: Height: 6\' 4"  (193 cm) Weight: 189 lb 9.5 oz (86 kg) IBW/kg (Calculated) : 86.8  Vital Signs: Temp: 97.7 F (36.5 C) (12/20 1231) Temp Source: Oral (12/20 1231) BP: 74/53 mmHg (12/20 1231) Pulse Rate: 107 (12/20 1231) Intake/Output from previous day: 12/19 0701 - 12/20 0700 In: 384.1 [I.V.:384.1] Out: 3200  Intake/Output from this shift: Total I/O In: -  Out: 1023 [Other:1023]  Labs:  Recent Labs  08/13/14 1402 08/15/14 0245 08/16/14 0430  WBC 14.7* 10.4 9.0  HGB 8.1* 7.7* 8.0*  PLT 198 201 194  CREATININE 4.79* 7.21*  --    Estimated Creatinine Clearance: 12.4 mL/min (by C-G formula based on Cr of 7.21). No results for input(s): VANCOTROUGH, VANCOPEAK, VANCORANDOM, GENTTROUGH, GENTPEAK, GENTRANDOM, TOBRATROUGH, TOBRAPEAK, TOBRARND, AMIKACINPEAK, AMIKACINTROU, AMIKACIN in the last 72 hours.   Microbiology: Recent Results (from the past 720 hour(s))  Blood culture (routine x 2)     Status: None (Preliminary result)   Collection Time: 08/13/14  5:45 PM  Result Value Ref Range Status   Specimen Description BLOOD LEFT HAND  Final   Special Requests BOTTLES DRAWN AEROBIC ONLY 4CC  Final   Culture NO GROWTH 3 DAYS  Final   Report Status PENDING  Incomplete  Blood culture (routine x 2)     Status: None (Preliminary result)   Collection Time: 08/13/14  5:55 PM  Result Value Ref Range Status   Specimen Description BLOOD LEFT ARM  Final   Special Requests   Final    BOTTLES DRAWN AEROBIC AND ANAEROBIC AEB=8CC ANA=6CC   Culture NO GROWTH 3 DAYS  Final   Report Status PENDING  Incomplete  MRSA PCR Screening     Status: Abnormal   Collection Time: 08/14/14  2:05 AM  Result Value Ref Range Status   MRSA  by PCR POSITIVE (A) NEGATIVE Final    Comment:        The GeneXpert MRSA Assay (FDA approved for NASAL specimens only), is one component of a comprehensive MRSA colonization surveillance program. It is not intended to diagnose MRSA infection nor to guide or monitor treatment for MRSA infections. RESULT CALLED TO, READ BACK BY AND VERIFIED WITH: D RICE,RN [id number] [phone] [facility]     Anti-infectives    Start     Dose/Rate Route Frequency Ordered Stop   08/15/14 1200  ceFEPIme (MAXIPIME) 2 g in dextrose 5 % 50 mL IVPB  Status:  Discontinued     2 g100 mL/hr over 30 Minutes Intravenous Every T-Th-Sa (Hemodialysis) 08/13/14 2145 08/16/14 1352   08/15/14 1200  vancomycin (VANCOCIN) IVPB 1000 mg/200 mL premix  Status:  Discontinued     1,000 mg200 mL/hr over 60 Minutes Intravenous Every T-Th-Sa (Hemodialysis) 08/13/14 2145 08/16/14 1351   08/13/14 2359  ceFEPIme (MAXIPIME) 1 g in dextrose 5 % 50 mL IVPB     1 g100 mL/hr over 30 Minutes Intravenous  Once 08/13/14 2145 08/14/14 0056   08/13/14 1730  vancomycin (VANCOCIN) 1,500 mg in sodium chloride 0.9 % 500 mL IVPB     1,500 mg250 mL/hr over 120 Minutes Intravenous  Once 08/13/14 1723 08/13/14 2057   08/13/14 1715  ceFEPIme (MAXIPIME) 1 g in dextrose 5 % 50 mL IVPB  1 g100 mL/hr over 30 Minutes Intravenous  Once 08/13/14 1710 08/13/14 1845      Assessment: 20 yoM presenting on 08/13/2014 with abdominal pain. Abdominal CT + right pleural effusion and thoracentesis is pending to r/o empyema. CXR + PNA. He has ESRD requiring HD on TTS as outpatient. Day #4 of empiric antibiotics for r/o sepsis. Remains afebrile with WBC trending down. Underwent extra HD today which was aborted early 2/2 tachycardia and hypotension.   Vancomycin 12/17>> Cefepime 12/17>>  12/17 BCx>>ngtd 12/20 pleural fluid>> MRSA +  Goal of Therapy:  Pre-HD Vancomycin level 15-27mcg/ml  Plan:  - Will enter vancomycin and cefepime doses based on HD schedule - Pre-HD  vancomycin level tomorrow am - Monitor temp, WBC, C&S  Cason Luffman K. Velva Harman, PharmD Clinical Pharmacist - Resident Pager: [phone] Pharmacy: [phone] 08/16/2014 1:56 PM

## 2014-08-16 NOTE — Procedures (Signed)
US guided diagnostic/therapeutic right thoracentesis performed yielding 1 liter thick,dark bloody fluid. Only the above amount of fluid could be aspirated at this time. The fluid was sent to the lab for preordered studies. F/u CXR pending. No immediate complications.

## 2014-08-16 NOTE — Progress Notes (Addendum)
[facility] Progress Note   Subjective: no complaints  Filed Vitals:   08/16/14 0403 08/16/14 0500 08/16/14 0600 08/16/14 0700  BP: 105/48 102/52 94/47 100/50  Pulse: 45 46 45 46  Temp: 99 F (37.2 C)     TempSrc:      Resp: 19 20 22 18   Height:      Weight: 89.2 kg (196 lb 10.4 oz)     SpO2: 100% 100% 99% 100%   Exam: Alert, no distress, calm, looks slightly dypsneic +JVD Chest dec'd R base, L clear RRR no MRG Abd soft, NTND, mild RUQ tenderness R AKA clean wound and no LE edema  Neuro is nf, Ox 3 AVF RFA with + bruit  CXR 12/17 vasc congestion, large R effusion  Dialysis Orders: TTS [facility] 3.5 hrs 86 kg 2K/2.5Ca 400/600 No Heparin AVF @ RFA  No Hectorol Epogen 6000 U per HD No Venofer  Assessment: 1. RUQ Pain / loculated R pleural effusion - WBC down w IV abx 20 > 9k.  Per CT Surg will do thoracentesis when INR down.  2. ESRD - HD TTS 3. Chronic hypotension / vol excess - still vol overloaded, on MTP for afib 75 bid as at home and on midodrine. Allow BP's in 70-80's post HD, would not give fluid if mentation ok.  4. Anemia - Hgb down 7.7, on EPO 5. Metabolic bone disease - Ca 8.6 (10.1 corrected), no Vitamin D, Sensipar 30 mg qd,Renvela 3 with meals, 1 with snacks. 6. Nutrition - Alb 2.5, renal carb-mod diet, vitamin. 7. Hx CABG/MVR 2008 / CM EF 15-20% 8. Hx A-fib - amio and MTP (coumadin currently on hold)     9. DM Type 2 - insulin per primary.  Plan- extra HD today for volume, HD again tomorrow (holiday schedule). Increased midodrine 10 bid for now    Kelly Splinter MD  pager [phone]    cell [phone]  08/16/2014, 7:35 AM     Recent Labs Lab 08/13/14 0403 08/13/14 1402 08/15/14 0245  NA 138 135* 130*  K 5.1 3.8 4.7  CL 96 93* 87*  CO2 21 24 23   GLUCOSE 186* 247* 242*  BUN 55* 24* 45*  CREATININE 9.42* 4.79* 7.21*  CALCIUM 9.0 8.6 8.4    Recent Labs Lab  08/13/14 0403 08/15/14 0245  AST 34 34  ALT 20 24  ALKPHOS 168* 151*  BILITOT 1.5* 2.0*  PROT 7.1 6.5  ALBUMIN 2.5* 2.1*    Recent Labs Lab 08/13/14 0403 08/13/14 1402 08/15/14 0245 08/16/14 0430  WBC 20.8* 14.7* 10.4 9.0  NEUTROABS 18.2* 12.5*  --   --   HGB 8.5* 8.1* 7.7* 8.0*  HCT 26.7* 25.9* 23.9* 24.5*  MCV 97.4 98.5 91.6 92.5  PLT 195 198 201 194   . allopurinol  100 mg Oral Daily  . atorvastatin  20 mg Oral QPM  . ceFEPime (MAXIPIME) IV  2 g Intravenous Q T,Th,Sa-HD  . cinacalcet  30 mg Oral Q breakfast  . folic acid  1 mg Oral Daily  . insulin aspart  0-5 Units Subcutaneous QHS  . insulin aspart  0-9 Units Subcutaneous TID WC  . insulin glargine  10 Units Subcutaneous QHS  . metoprolol  75 mg Oral BID  . midodrine  5 mg Oral TID AC  . pantoprazole  40 mg Oral BID  . phytonadione (VITAMIN K) IV  5 mg Intravenous Once  . sevelamer carbonate  800 mg Oral TID WC  .  sodium chloride  3 mL Intravenous Q12H  . sodium chloride  3 mL Intravenous Q12H  . traZODone  50 mg Oral QHS  . vancomycin  1,000 mg Intravenous Q T,Th,Sa-HD   . amiodarone 30 mg/hr (08/16/14 0600)  . norepinephrine (LEVOPHED) Adult infusion     albuterol, HYDROcodone-acetaminophen, ondansetron **OR** ondansetron (ZOFRAN) IV, traMADol

## 2014-08-16 NOTE — Progress Notes (Signed)
Unable to remove much fluid today, heart rate went up into 130 range and had a short run 10 beats of NSVT so HD was aborted about half way through.  Pt was asymptomatic, BP went up initially then down into 80's.    Kelly Splinter MD (pgr) [phone]    [phone] 08/16/2014, 12:39 PM

## 2014-08-17 ENCOUNTER — Ambulatory Visit: Payer: Medicare Other | Admitting: Orthopedic Surgery

## 2014-08-17 DIAGNOSIS — J948 Other specified pleural conditions: Secondary | ICD-10-CM

## 2014-08-17 DIAGNOSIS — I953 Hypotension of hemodialysis: Secondary | ICD-10-CM

## 2014-08-17 LAB — RENAL FUNCTION PANEL
ALBUMIN: 1.9 g/dL — AB (ref 3.5–5.2)
ANION GAP: 16 — AB (ref 5–15)
Albumin: 2 g/dL — ABNORMAL LOW (ref 3.5–5.2)
Anion gap: 17 — ABNORMAL HIGH (ref 5–15)
BUN: 27 mg/dL — ABNORMAL HIGH (ref 6–23)
BUN: 30 mg/dL — AB (ref 6–23)
CHLORIDE: 95 meq/L — AB (ref 96–112)
CO2: 25 mEq/L (ref 19–32)
CO2: 25 mEq/L (ref 19–32)
Calcium: 8.9 mg/dL (ref 8.4–10.5)
Calcium: 8.9 mg/dL (ref 8.4–10.5)
Chloride: 94 mEq/L — ABNORMAL LOW (ref 96–112)
Creatinine, Ser: 5.4 mg/dL — ABNORMAL HIGH (ref 0.50–1.35)
Creatinine, Ser: 5.98 mg/dL — ABNORMAL HIGH (ref 0.50–1.35)
GFR calc Af Amer: 10 mL/min — ABNORMAL LOW (ref >90)
GFR calc non Af Amer: 10 mL/min — ABNORMAL LOW (ref >90)
GFR calc non Af Amer: 9 mL/min — ABNORMAL LOW (ref >90)
GFR, EST AFRICAN AMERICAN: 12 mL/min — AB (ref >90)
Glucose, Bld: 172 mg/dL — ABNORMAL HIGH (ref 70–99)
Glucose, Bld: 169 mg/dL — ABNORMAL HIGH (ref 70–99)
PHOSPHORUS: 4.1 mg/dL (ref 2.3–4.6)
POTASSIUM: 4 meq/L (ref 3.7–5.3)
Phosphorus: 3.8 mg/dL (ref 2.3–4.6)
Potassium: 4.2 mEq/L (ref 3.7–5.3)
SODIUM: 136 meq/L — AB (ref 137–147)
Sodium: 136 mEq/L — ABNORMAL LOW (ref 137–147)

## 2014-08-17 LAB — CBC
HCT: 25.7 % — ABNORMAL LOW (ref 39.0–52.0)
Hemoglobin: 8.5 g/dL — ABNORMAL LOW (ref 13.0–17.0)
MCH: 28.7 pg (ref 26.0–34.0)
MCHC: 33.1 g/dL (ref 30.0–36.0)
MCV: 86.8 fL (ref 78.0–100.0)
Platelets: 193 10*3/uL (ref 150–400)
RBC: 2.96 MIL/uL — ABNORMAL LOW (ref 4.22–5.81)
RDW: 16.8 % — AB (ref 11.5–15.5)
WBC: 10.7 10*3/uL — ABNORMAL HIGH (ref 4.0–10.5)

## 2014-08-17 LAB — HEPARIN LEVEL (UNFRACTIONATED)

## 2014-08-17 LAB — VANCOMYCIN, RANDOM: VANCOMYCIN RM: 15.2 ug/mL

## 2014-08-17 LAB — GLUCOSE, CAPILLARY
GLUCOSE-CAPILLARY: 171 mg/dL — AB (ref 70–99)
Glucose-Capillary: 154 mg/dL — ABNORMAL HIGH (ref 70–99)
Glucose-Capillary: 161 mg/dL — ABNORMAL HIGH (ref 70–99)
Glucose-Capillary: 193 mg/dL — ABNORMAL HIGH (ref 70–99)

## 2014-08-17 LAB — PROTIME-INR
INR: 1.38 (ref 0.00–1.49)
Prothrombin Time: 17.1 seconds — ABNORMAL HIGH (ref 11.6–15.2)

## 2014-08-17 MED ORDER — NEPRO/CARBSTEADY PO LIQD: 237.0000 mL | ORAL | Status: DC | PRN

## 2014-08-17 MED ORDER — MUPIROCIN 2 % EX OINT
1.0000 "application " | TOPICAL_OINTMENT | Freq: Two times a day (BID) | CUTANEOUS | Status: DC
  Administered 2014-08-17 – 2014-08-20 (×5): 1 via NASAL
  Filled 2014-08-17 (×2): qty 22

## 2014-08-17 MED ORDER — PENTAFLUOROPROP-TETRAFLUOROETH EX AERO: 1.0000 "application " | INHALATION_SPRAY | CUTANEOUS | Status: DC | PRN

## 2014-08-17 MED ORDER — GUAIFENESIN ER 600 MG PO TB12
600.0000 mg | ORAL_TABLET | Freq: Two times a day (BID) | ORAL | Status: DC
  Administered 2014-08-17 – 2014-08-20 (×6): 600 mg via ORAL
  Filled 2014-08-17 (×9): qty 1

## 2014-08-17 MED ORDER — WARFARIN - PHARMACIST DOSING INPATIENT: Freq: Every day | Status: DC

## 2014-08-17 MED ORDER — LIDOCAINE HCL (PF) 1 % IJ SOLN: 5.0000 mL | INTRAMUSCULAR | Status: DC | PRN

## 2014-08-17 MED ORDER — HEPARIN (PORCINE) IN NACL 100-0.45 UNIT/ML-% IJ SOLN
1400.0000 [IU]/h | INTRAMUSCULAR | Status: DC
  Administered 2014-08-17: 1400 [IU]/h via INTRAVENOUS
  Filled 2014-08-17 (×2): qty 250

## 2014-08-17 MED ORDER — CHLORHEXIDINE GLUCONATE CLOTH 2 % EX PADS
6.0000 | MEDICATED_PAD | Freq: Every day | CUTANEOUS | Status: DC
  Administered 2014-08-18 – 2014-08-20 (×3): 6 via TOPICAL

## 2014-08-17 MED ORDER — SODIUM CHLORIDE 0.9 % IV SOLN: 100.0000 mL | INTRAVENOUS | Status: DC | PRN

## 2014-08-17 MED ORDER — LIDOCAINE-PRILOCAINE 2.5-2.5 % EX CREA: 1.0000 "application " | TOPICAL_CREAM | CUTANEOUS | Status: DC | PRN

## 2014-08-17 MED ORDER — WARFARIN SODIUM 4 MG PO TABS
4.0000 mg | ORAL_TABLET | Freq: Once | ORAL | Status: AC
  Administered 2014-08-17: 4 mg via ORAL
  Filled 2014-08-17: qty 1

## 2014-08-17 MED ORDER — SODIUM CHLORIDE 0.9 % IV SOLN
INTRAVENOUS | Status: DC
  Administered 2014-08-17: 23:00:00 via INTRAVENOUS

## 2014-08-17 MED ORDER — HEPARIN (PORCINE) IN NACL 100-0.45 UNIT/ML-% IJ SOLN
2000.0000 [IU]/h | INTRAMUSCULAR | Status: DC
  Administered 2014-08-17 – 2014-08-18 (×2): 1650 [IU]/h via INTRAVENOUS
  Filled 2014-08-17 (×3): qty 250

## 2014-08-17 MED ORDER — HEPARIN SODIUM (PORCINE) 1000 UNIT/ML DIALYSIS: 1000.0000 [IU] | INTRAMUSCULAR | Status: DC | PRN

## 2014-08-17 MED ORDER — ALTEPLASE 2 MG IJ SOLR: 2.0000 mg | Freq: Once | INTRAMUSCULAR | Status: AC | PRN

## 2014-08-17 MED ORDER — HEPARIN SODIUM (PORCINE) 1000 UNIT/ML DIALYSIS: 20.0000 [IU]/kg | INTRAMUSCULAR | Status: DC | PRN

## 2014-08-17 MED ORDER — CEFEPIME HCL 1 G IJ SOLR
1.0000 g | INTRAMUSCULAR | Status: DC
  Administered 2014-08-18: 1 g via INTRAVENOUS
  Filled 2014-08-17 (×2): qty 1

## 2014-08-17 MED ORDER — VANCOMYCIN HCL IN DEXTROSE 1-5 GM/200ML-% IV SOLN
1000.0000 mg | INTRAVENOUS | Status: DC
  Administered 2014-08-18: 1000 mg via INTRAVENOUS
  Filled 2014-08-17 (×2): qty 200

## 2014-08-17 NOTE — Progress Notes (Signed)
Procedure(s) (LRB): TRANSESOPHAGEAL ECHOCARDIOGRAM (TEE) (N/A) CARDIOVERSION (N/A) Subjective: No flank pain presently   Objective: Vital signs in last 24 hours: Temp:  [97.4 F (36.3 C)-98.6 F (37 C)] 98.6 F (37 C) (12/21 1100) Pulse Rate:  [47-151] 59 (12/21 1300) Cardiac Rhythm:  [-] Atrial fibrillation (12/21 0800) Resp:  [15-30] 22 (12/21 1300) BP: (61-129)/(33-96) 102/57 mmHg (12/21 1300) SpO2:  [93 %-99 %] 98 % (12/21 1300) Weight:  [195 lb 5.2 oz (88.6 kg)] 195 lb 5.2 oz (88.6 kg) (12/21 0300)  Hemodynamic parameters for last 24 hours:    Intake/Output from previous day: 12/20 0701 - 12/21 0700 In: 817.5 [P.O.:80; I.V.:307.5; Blood:430] Out: 1023  Intake/Output this shift: Total I/O In: 123.1 [I.V.:123.1] Out: -   General appearance: alert and cooperative Neurologic: intact Heart: irregularly irregular rhythm Lungs: diminished breath sounds at right base  Lab Results:  Recent Labs  08/16/14 0430 08/17/14 0320  WBC 9.0 10.7*  HGB 8.0* 8.5*  HCT 24.5* 25.7*  PLT 194 193   BMET:  Recent Labs  08/17/14 0320 08/17/14 1022  NA 136* 136*  K 4.2 4.0  CL 94* 95*  CO2 25 25  GLUCOSE 172* 169*  BUN 27* 30*  CREATININE 5.40* 5.98*  CALCIUM 8.9 8.9    PT/INR:  Recent Labs  08/17/14 0320  LABPROT 17.1*  INR 1.38   ABG    Component Value Date/Time   PHART 7.406 02/13/2014 2235   HCO3 22.4 02/13/2014 2235   TCO2 20.4 02/13/2014 2235   ACIDBASEDEF 1.6 02/13/2014 2235   O2SAT 89.9 02/13/2014 2235   CBG (last 3)   Recent Labs  08/16/14 2120 08/17/14 0811 08/17/14 1210  GLUCAP 202* 154* 171*    Assessment/Plan: S/P Procedure(s) (LRB): TRANSESOPHAGEAL ECHOCARDIOGRAM (TEE) (N/A) CARDIOVERSION (N/A) -  Atrial fib with RVR and BP remain problematic Had thoracentesis yesterday 1L of bloody fluid drained- likely an old hemothorax He says it didn't change his breathing much Fluid was old blood so not surprising c/w exudate LDH of 10  and pH of 8 suggest it is not infected Will await final culture results I do not see an indication for surgery in this setting   LOS: 4 days    Nikkia Devoss C 08/17/2014

## 2014-08-17 NOTE — Progress Notes (Signed)
ANTICOAGULATION CONSULT NOTE - Initial Consult  Pharmacy Consult for Heparin Indication: atrial fibrillation  Allergies  Allergen Reactions  . Bacitracin Other (See Comments)    unknown  . Norvasc [Amlodipine Besylate] Other (See Comments)    unknown    Patient Measurements: Height: 6\' 4"  (193 cm) Weight: 195 lb 5.2 oz (88.6 kg) IBW/kg (Calculated) : 86.8 Heparin Dosing Weight: 88.6 kg  Vital Signs: Temp: 98.8 F (37.1 C) (12/21 1900) Temp Source: Oral (12/21 1900) BP: 93/45 mmHg (12/21 2000) Pulse Rate: 46 (12/21 2000)  Labs:  Recent Labs  08/15/14 0245 08/15/14 1510 08/16/14 0430 08/17/14 0320 08/17/14 1022 08/17/14 1930  HGB 7.7*  --  8.0* 8.5*  --   --   HCT 23.9*  --  24.5* 25.7*  --   --   PLT 201  --  194 193  --   --   LABPROT 42.5* 21.1*  --  17.1*  --   --   INR 4.42* 1.80*  --  1.38  --   --   HEPARINUNFRC  --   --   --   --   --  <0.10*  CREATININE 7.21*  --   --  5.40* 5.98*  --     Estimated Creatinine Clearance: 15.1 mL/min (by C-G formula based on Cr of 5.98).   Medical History: Past Medical History  Diagnosis Date  . UGI bleed 02/12/11    On anticoagulation; EGD w/snare polypectomy-multiple polypoid lesions antrum(benign), Chronic active gastritis (NEGATIVE H pylori)  . Anemia, chronic disease   . Sepsis due to enterococcus 02/11/11  . Atrial fibrillation     On coumadin  . Coronary atherosclerosis of native coronary artery     Multivessel status post CABG  . Cardiomyopathy     LVEF 40-45% 01/2011  . Type 2 diabetes mellitus   . Essential hypertension, benign   . Hyperlipemia   . Gout   . S/P patent foramen ovale closure   . Hyperbilirubinemia 08/10/2013  . SBO (small bowel obstruction) 01/2014  . 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 Clostridium difficile colitis 01/2014  . Diverticulosis   . Internal hemorrhoids   . ESRD on hemodialysis     Started dialysis sometime around 2008-2009.  Gets HD in [place], [place] with Dr Hinda Lenis on a TTS  schedule.  Runs 3.5 hours with dry wt 86.5 kg as of June 2015.  Had a L arm AVF which didn't work and has been using his R forearm AVF for several years.      Medications:  Scheduled:  . allopurinol  100 mg Oral Daily  . atorvastatin  20 mg Oral QPM  . [START ON 08/18/2014] ceFEPime (MAXIPIME) IV  1 g Intravenous Q T,Th,Sa-HD  . [START ON 08/18/2014] Chlorhexidine Gluconate Cloth  6 each Topical Q0600  . cinacalcet  30 mg Oral Q breakfast  . folic acid  1 mg Oral Daily  . guaiFENesin  600 mg Oral BID  . insulin aspart  0-5 Units Subcutaneous QHS  . insulin aspart  0-9 Units Subcutaneous TID WC  . insulin glargine  10 Units Subcutaneous QHS  . metoprolol  75 mg Oral BID  . midodrine  10 mg Oral BID WC  . mupirocin ointment  1 application Nasal BID  . pantoprazole  40 mg Oral BID  . phytonadione (VITAMIN K) IV  5 mg Intravenous Once  . sevelamer carbonate  800 mg Oral TID WC  . sodium chloride  3  mL Intravenous Q12H  . traZODone  50 mg Oral QHS  . [START ON 08/18/2014] vancomycin  1,000 mg Intravenous Q T,Th,Sa-HD  . Warfarin - Pharmacist Dosing Inpatient   Does not apply q1800    Assessment: 63yom on coumadin pta for afib, admitted with a supratherapeutic INR. Coumadin held (last dose 12/16) and INR reversed pending a thoracentesis. He received 10mg  vitamin k on 12/19. Thoracentesis done yesterday which yielded 1 liter of dark bloody fluid. INR is down to 1.3 - coumadin to resume today with heparin bridge as patient will undergo a TEE/DCCV tomorrow for his afib. He may require higher coumadin doses initially to overcome vitamin k resistance, but don't want to be too aggressive as he is on IV amiodarone. Home dose: 1.25mg  daily (1/2 of 2.5mg  tablet).  F/u heparin level undetectable on 1400 units/hr.  No bleeding or complications noted.  RN reports no issues with IV.  Won't bolus for now given recent hx of blood in stool.  Goal of Therapy:  Heparin level 0.3-0.7 units/ml Monitor  platelets by anticoagulation protocol: Yes   Plan:  1. Increase IV heparin to 1650 units/hr. 2. Recheck heparin level with AM labs. 3. Daily heparin level and CBC.  Uvaldo Rising, BCPS  Clinical Pharmacist Pager [phone]  08/17/2014 8:56 PM

## 2014-08-17 NOTE — Progress Notes (Addendum)
[facility] PROGRESS NOTE   Admission history of present illness/brief narrative: This is a 63 year old male w/ complicated med hx: ESRD, type II DM, CABG/ MVR in 2008, CHF, cardiomyopathy (EF 15-20 %), atrial fibrillation, GI bleeds, SBP, pneumatosis coli, c diff colitis. Admitted 12/17 w/ working dx of secondary infection of chronic loculated right effusion. Was being treated w/ ABX IV vancomycin and cefepime since admission 12/17, seen by CT surgery, initially with supratherapeutic INR corrected w/ Vit K w/ planned thoracentesis and possible VATS.  Patient hospital stay complicated by hypotension on 12/19 after hemodialysis, felt to be septic/cardiac/hypovolemic shock, as well A. fib with RVR initially on Cardizem drip, but change to  amiodarone drip secondary to hypotension. Patient had thoracocentesis on 08/16/14 with 1 L drained, appears exudate, but gram stains are negative, no growth so far. Assessment/Plan:  Pleural effusion, right/ Loculated pleural effusion/empyema: - Patient underwent thoracocentesis 12/20 with 1 L drained. -Workup still pending. -  CT surgery consult appreciated,  -  infected old hemothorax versus empyema, no no growth or poor fluid, Gram stain is negative, discussed with CT surgery, no plans for surgery as Swen[place] there is no infection apparent on the cultures, so would resume back on anticoagulation. - empirically on vanc and cefepime 12/17.  Hypotension: - Appears multifactorial cardiac, sepsis, hypovolemia. -Discussed with nephrology, patient is usually hypotensive after hemodialysis, that's why he is on midodrine, can tolerate systolic blood pressure in the low 80s and high 70s as Meetze patient is asymptomatic.  Atrial fibrillation/Mcpherson term current use of anticoagulant therapy: - still on amiodarone drip, cardiology consult appreciated. - Possible need for TEE guided cardioversion as per cardiology -Weighted this anticoagulation recommendation with  cardiology.  Coagulopathy with supra therapeutic INR: -Secondary to warfarin, corrected with vitamin K.  ESRD (end stage renal disease) on dialysis: - Renal consulted - One unit packed red blood cell transfusion 12/20  DM type 2 causing ESRD - Lantus insulin and sliding scale of insulin. - cbg's ACHS.   DVT prophylaxis - started on subcutaneous heparin.   Code Status: full Family Communication: None at bedside Disposition Plan: Remains in stepdown   Consultants:  Renal  CT Surgery  PCCM  Procedures:  Ct abd and pelvis  Thoracocentesis with 1 L drained 12/20  One unit packed red blood cell transfusion 12/20  Antibiotics:  vanc and cefepime 12.17.2015.  HPI/Subjective: Had could not sleep, no shortness of breath, no fever no chest pain overnight.  Objective: Filed Vitals:   08/17/14 0000 08/17/14 0300 08/17/14 0400 08/17/14 0808  BP: 87/42 90/37 90/37  96/42  Pulse: 47 48 47 71  Temp:  97.7 F (36.5 C)  98.4 F (36.9 C)  TempSrc:  Oral  Oral  Resp: 27 24 21 21   Height:      Weight:  88.6 kg (195 lb 5.2 oz)    SpO2: 96% 94% 93% 99%    Intake/Output Summary (Last 24 hours) at 08/17/14 0940 Last data filed at 08/17/14 0900  Gross per 24 hour  Intake 850.86 ml  Output   1023 ml  Net -172.14 ml   Filed Weights   08/16/14 1040 08/16/14 1231 08/17/14 0300  Weight: 87.8 kg (193 lb 9 oz) 86 kg (189 lb 9.5 oz) 88.6 kg (195 lb 5.2 oz)    Exam:  General: Alert, awake, oriented x3, in no acute distress.  HEENT: No bruits, no goiter.  Heart: Irregular irregular, no rubs, gallops. Lungs: Good air movement,  Abdomen: Soft, nontender,  nondistended, positive bowel sounds.  EXT Right AKA    Data Reviewed: Basic Metabolic Panel:  Recent Labs Lab 08/13/14 0403 08/13/14 1402 08/15/14 0245 08/17/14 0320  NA 138 135* 130* 136*  K 5.1 3.8 4.7 4.2  CL 96 93* 87* 94*  CO2 21 24 23 25   GLUCOSE 186* 247* 242* 172*  BUN 55* 24* 45* 27*  CREATININE  9.42* 4.79* 7.21* 5.40*  CALCIUM 9.0 8.6 8.4 8.9  PHOS  --   --   --  4.1   Liver Function Tests:  Recent Labs Lab 08/13/14 0403 08/15/14 0245 08/16/14 1616 08/17/14 0320  AST 34 34  --   --   ALT 20 24  --   --   ALKPHOS 168* 151*  --   --   BILITOT 1.5* 2.0*  --   --   PROT 7.1 6.5 6.6  --   ALBUMIN 2.5* 2.1*  --  1.9*   No results for input(s): LIPASE, AMYLASE in the last 168 hours. No results for input(s): AMMONIA in the last 168 hours. CBC:  Recent Labs Lab 08/13/14 0403 08/13/14 1402 08/15/14 0245 08/16/14 0430 08/17/14 0320  WBC 20.8* 14.7* 10.4 9.0 10.7*  NEUTROABS 18.2* 12.5*  --   --   --   HGB 8.5* 8.1* 7.7* 8.0* 8.5*  HCT 26.7* 25.9* 23.9* 24.5* 25.7*  MCV 97.4 98.5 91.6 92.5 86.8  PLT 195 198 201 194 193   Cardiac Enzymes:  Recent Labs Lab 08/13/14 0403 08/13/14 1402  TROPONINI <0.30 <0.30   BNP (last 3 results)  Recent Labs  08/15/14 0245  PROBNP >70000.0*   CBG:  Recent Labs Lab 08/16/14 0813 08/16/14 1324 08/16/14 1721 08/16/14 2120 08/17/14 0811  GLUCAP 170* 141* 176* 202* 154*    Recent Results (from the past 240 hour(s))  Blood culture (routine x 2)     Status: None (Preliminary result)   Collection Time: 08/13/14  5:45 PM  Result Value Ref Range Status   Specimen Description BLOOD LEFT HAND  Final   Special Requests BOTTLES DRAWN AEROBIC ONLY 4CC  Final   Culture NO GROWTH 3 DAYS  Final   Report Status PENDING  Incomplete  Blood culture (routine x 2)     Status: None (Preliminary result)   Collection Time: 08/13/14  5:55 PM  Result Value Ref Range Status   Specimen Description BLOOD LEFT ARM  Final   Special Requests   Final    BOTTLES DRAWN AEROBIC AND ANAEROBIC AEB=8CC ANA=6CC   Culture NO GROWTH 3 DAYS  Final   Report Status PENDING  Incomplete  MRSA PCR Screening     Status: Abnormal   Collection Time: 08/14/14  2:05 AM  Result Value Ref Range Status   MRSA by PCR POSITIVE (A) NEGATIVE Final    Comment:          The GeneXpert MRSA Assay (FDA approved for NASAL specimens only), is one component of a comprehensive MRSA colonization surveillance program. It is not intended to diagnose MRSA infection nor to guide or monitor treatment for MRSA infections. RESULT CALLED TO, READ BACK BY AND VERIFIED WITH: D RICE,RN [id number] [facility]   Body fluid culture     Status: None (Preliminary result)   Collection Time: 08/16/14 10:33 AM  Result Value Ref Range Status   Specimen Description PLEURAL RIGHT FLUID  Final   Special Requests NONE  Final   Gram Stain   Final    FEW WBC PRESENT, PREDOMINANTLY  PMN NO SQUAMOUS EPITHELIAL CELLS SEEN NO ORGANISMS SEEN Performed at [facility]    Culture   Final    NO GROWTH 1 DAY Performed at [facility]    Report Status PENDING  Incomplete     Studies: Dg Chest 1 View  08/16/2014   CLINICAL DATA:  Status post thoracentesis  EXAM: CHEST - 1 VIEW  COMPARISON:  August 13, 2014  FINDINGS: There is no appreciable pneumothorax. There is less effusion on the right compared to recent prior study. There is asymmetric edema in the right lung with patchy consolidation in the right mid and lower lung zones. On the left, there is atelectatic change in the lateral base region. There is a slight degree of interstitial edema on the left. Heart is enlarged. Patient is status post mitral valve replacement as well as coronary artery bypass grafting. There is a degree of pulmonary venous hypertension. No adenopathy.  IMPRESSION: Less effusion on the right compared to recent prior study. There is right lower lobe consolidation. There is a degree of underlying congestive heart failure. There is somewhat more edema on the right than on the left. No pneumothorax.   Electronically Signed   By: Lowella Grip M.D.   On: 08/16/2014 11:01   US Thoracentesis Asp Pleural Space W/img Guide  08/16/2014   INDICATION: Patient with history of end-stage renal disease, CHF,  chronic right effusion. Request is made for diagnostic and therapeutic right thoracentesis.  EXAM: ULTRASOUND GUIDED DIAGNOSTIC AND THERAPEUTIC RIGHT THORACENTESIS  COMPARISON:  None.  MEDICATIONS: None  COMPLICATIONS: None immediate  TECHNIQUE: Informed written consent was obtained from the patient after a discussion of the risks, benefits and alternatives to treatment. A timeout was performed prior to the initiation of the procedure.  Initial ultrasound scanning demonstrates a moderate-to-large right pleural effusion. The lower chest was prepped and draped in the usual sterile fashion. 1% lidocaine was used for local anesthesia.  Under direct ultrasound guidance, a 19 gauge, 7-cm, Yueh catheter was introduced. An ultrasound image was saved for documentation purposes. The thoracentesis was performed. The catheter was removed and a dressing was applied. The patient tolerated the procedure well without immediate post procedural complication. The patient was escorted to have an upright chest radiograph.  FINDINGS: A total of approximately 1 liter of thick, dark bloody fluid was removed. Only the above amount of fluid could be aspirated at this time. Requested samples were sent to the laboratory.  IMPRESSION: Successful ultrasound-guided diagnostic and therapeutic right sided thoracentesis yielding 1 liter of pleural fluid.  Read by: Rowe Robert, PA-C   Electronically Signed   By: Markus Daft M.D.   On: 08/16/2014 10:31    Scheduled Meds: . allopurinol  100 mg Oral Daily  . atorvastatin  20 mg Oral QPM  . cinacalcet  30 mg Oral Q breakfast  . folic acid  1 mg Oral Daily  . guaiFENesin  600 mg Oral BID  . heparin subcutaneous  5,000 Units Subcutaneous 3 times per day  . insulin aspart  0-5 Units Subcutaneous QHS  . insulin aspart  0-9 Units Subcutaneous TID WC  . insulin glargine  10 Units Subcutaneous QHS  . metoprolol  75 mg Oral BID  . midodrine  10 mg Oral BID WC  . pantoprazole  40 mg Oral BID  .  phytonadione (VITAMIN K) IV  5 mg Intravenous Once  . sevelamer carbonate  800 mg Oral TID WC  . sodium chloride  3 mL  Intravenous Q12H  . traZODone  50 mg Oral QHS   Continuous Infusions: . amiodarone 30 mg/hr (08/17/14 0900)   Time spent: 35 minutes  Kemberly Taves  [facility] Pager [phone]. If 8PM-8AM, please contact night-coverage at [email or web address], password [facility] 08/17/2014, 9:40 AM  LOS: 4 days

## 2014-08-17 NOTE — Progress Notes (Signed)
Assessment: 1. Hemothorax, right 2. ESRD - HD TTS Plan HD for AM 3. Chronic hypotension / vol excess - still vol overloaded, on MTP for afib 75 bid as at home and on midodrine. 4. 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 Hx A-fib - amio and MTP (coumadin currently on hold)  6. DM Type 2 - insulin per primary.  Subjective: Interval History: reports chronic low BP  Objective: Vital signs in last 24 hours: Temp:  [97.4 F (36.3 C)-98.6 F (37 C)] 98.6 F (37 C) (12/21 1100) Pulse Rate:  [47-153] 59 (12/21 1300) Resp:  [15-30] 22 (12/21 1300) BP: (61-129)/(33-96) 102/57 mmHg (12/21 1300) SpO2:  [93 %-99 %] 98 % (12/21 1300) Weight:  [88.6 kg (195 lb 5.2 oz)] 88.6 kg (195 lb 5.2 oz) (12/21 0300) Weight change: -2.9 kg (-6 lb 6.3 oz)  Intake/Output from previous day: 12/20 0701 - 12/21 0700 In: 817.5 [P.O.:80; I.V.:307.5; Blood:430] Out: 1023  Intake/Output this shift: Total I/O In: 123.1 [I.V.:123.1] Out: -   General appearance: alert and cooperative GI: soft, non-tender; bowel sounds normal; no masses,  no organomegaly Extremities: Right AKA. No Le edema  Lab Results:  Recent Labs  08/16/14 0430 08/17/14 0320  WBC 9.0 10.7*  HGB 8.0* 8.5*  HCT 24.5* 25.7*  PLT 194 193   BMET:  Recent Labs  08/17/14 0320 08/17/14 1022  NA 136* 136*  K 4.2 4.0  CL 94* 95*  CO2 25 25  GLUCOSE 172* 169*  BUN 27* 30*  CREATININE 5.40* 5.98*  CALCIUM 8.9 8.9   No results for input(s): PTH in the last 72 hours. Iron Studies: No results for input(s): IRON, TIBC, TRANSFERRIN, FERRITIN in the last 72 hours. Studies/Results: Dg Chest 1 View  08/16/2014   CLINICAL DATA:  Status post thoracentesis  EXAM: CHEST - 1 VIEW  COMPARISON:  August 13, 2014  FINDINGS: There is no appreciable pneumothorax. There is less effusion on the right compared to recent prior study. There is asymmetric edema in the right lung with patchy consolidation in the right mid and lower lung zones. On the  left, there is atelectatic change in the lateral base region. There is a slight degree of interstitial edema on the left. Heart is enlarged. Patient is status post mitral valve replacement as well as coronary artery bypass grafting. There is a degree of pulmonary venous hypertension. No adenopathy.  IMPRESSION: Less effusion on the right compared to recent prior study. There is right lower lobe consolidation. There is a degree of underlying congestive heart failure. There is somewhat more edema on the right than on the left. No pneumothorax.   Electronically Signed   By: Lowella Grip M.D.   On: 08/16/2014 11:01   US Thoracentesis Asp Pleural Space W/img Guide  08/16/2014   INDICATION: Patient with history of end-stage renal disease, CHF, chronic right effusion. Request is made for diagnostic and therapeutic right thoracentesis.  EXAM: ULTRASOUND GUIDED DIAGNOSTIC AND THERAPEUTIC RIGHT THORACENTESIS  COMPARISON:  None.  MEDICATIONS: None  COMPLICATIONS: None immediate  TECHNIQUE: Informed written consent was obtained from the patient after a discussion of the risks, benefits and alternatives to treatment. A timeout was performed prior to the initiation of the procedure.  Initial ultrasound scanning demonstrates a moderate-to-large right pleural effusion. The lower chest was prepped and draped in the usual sterile fashion. 1% lidocaine was used for local anesthesia.  Under direct ultrasound guidance, a 19 gauge, 7-cm, Yueh catheter was introduced. An ultrasound image was  saved for documentation purposes. The thoracentesis was performed. The catheter was removed and a dressing was applied. The patient tolerated the procedure well without immediate post procedural complication. The patient was escorted to have an upright chest radiograph.  FINDINGS: A total of approximately 1 liter of thick, dark bloody fluid was removed. Only the above amount of fluid could be aspirated at this time. Requested samples were  sent to the laboratory.  IMPRESSION: Successful ultrasound-guided diagnostic and therapeutic right sided thoracentesis yielding 1 liter of pleural fluid.  Read by: Rowe Robert, PA-C   Electronically Signed   By: Markus Daft M.D.   On: 08/16/2014 10:31   Scheduled: . allopurinol  100 mg Oral Daily  . atorvastatin  20 mg Oral QPM  . cinacalcet  30 mg Oral Q breakfast  . folic acid  1 mg Oral Daily  . guaiFENesin  600 mg Oral BID  . insulin aspart  0-5 Units Subcutaneous QHS  . insulin aspart  0-9 Units Subcutaneous TID WC  . insulin glargine  10 Units Subcutaneous QHS  . metoprolol  75 mg Oral BID  . midodrine  10 mg Oral BID WC  . pantoprazole  40 mg Oral BID  . phytonadione (VITAMIN K) IV  5 mg Intravenous Once  . sevelamer carbonate  800 mg Oral TID WC  . sodium chloride  3 mL Intravenous Q12H  . traZODone  50 mg Oral QHS  . warfarin  4 mg Oral ONCE-1800  . Warfarin - Pharmacist Dosing Inpatient   Does not apply q1800     LOS: 4 days   Glendell Fouse C 08/17/2014,2:30 PM

## 2014-08-17 NOTE — Care Management Note (Addendum)
    Page 1 of 2   08/20/2014     2:46:37 PM CARE MANAGEMENT NOTE 08/20/2014  Patient:  [name] [name], [name] [name]   Account Number:  [id number]  Date Initiated:  08/14/2014  Documentation initiated by:  Luz Lex  Subjective/Objective Assessment:   Admitted with Afib - on amio drip     Action/Plan:   Anticipated DC Date:  08/20/2014   Anticipated DC Plan:  [place]  CM consult      Choice offered to / List presented to:          [facility] arranged  HH-1 RN  Holiday.   Status of service:  Completed, signed off Medicare Important Message given?  YES (If response is "NO", the following Medicare IM given date fields will be blank) Date Medicare IM given:  08/17/2014 Medicare IM given by:  Elissa Hefty Date Additional Medicare IM given:  08/19/2014 Additional Medicare IM given by:  Elissa Hefty  Discharge Disposition:  [place]  Per UR Regulation:  Reviewed for med. necessity/level of care/duration of stay  If discussed at [place] of Stay Meetings, dates discussed:   08/18/2014  08/20/2014    Comments:  Contact:  Maselli,Shirley Spouse Taft Son [phone]  [phone]   08-20-14 2:40pm Luz Lex, RNBSN [phone] Plan for dc today .  Set up with [facility] - had previoiusly and wants them to return.  Referral made. To get Dialysis at [facility] in [place] - Referral made - orders sent.  To recieve dialysis on Sunday at 6:45am instead of Saturday.  Physician updated - ok with this - Wife notified.

## 2014-08-17 NOTE — Progress Notes (Signed)
PULMONARY / CRITICAL CARE MEDICINE   Name: [name] [name] MRN: [id number] DOB: Jul 16, 1951    ADMISSION DATE:  08/13/2014 CONSULTATION DATE:  12/19  REFERRING MD :  Elgergawy   CHIEF COMPLAINT:  Shock and AF w/ RVR   INITIAL PRESENTATION:  This is a 63 year old male w/ complicated med hx: ESRD, type II DM, CABG/ MVR in 2008, CHF, cardiomyopathy (EF 15-20 %), atrial fibrillation, GI bleeds, SBP, pneumatosis coli,  c diff colitis. Admitted 12/17 w/ working dx of secondary infection of chronic loculated right effusion. Was being treated w/ ABX, correcting INR w/ Vit K w/ planned thoracentesis and possible VATS. On 12/19 PCCM was consulted for hypotension when he returned from HD w/ SBP in 70s    STUDIES:  CT abd 12/17: Large loculated right pleural effusion. This may represent contracting hemothorax based on the prior CT.Liver and spleen normal. Small calcified gallstones. Gallbladder is contracted and not thickened. Bile ducts nondilated. Pancreas and spleen are normal.Small kidneys due to chronic renal failure. No renal mass or hydronephrosis.Negative for bowel obstruction. No bowel thickening. Negative for diverticulitis. Normal appendix. Resolution of thickening of the cecum compared with the prior study which may be resolution of colitis  SIGNIFICANT EVENTS: 12/17: admitted for sepsis felt to be d/t secondary infxn of right chronic effusion. ABX started 12/18 seen by thoracic. Recommended thoracentesis but INR was supra therapeutic  12/19 hypotensive after HD 12/20 thora done by IR.  SUBJECTIVE:  No events overnight, up in a chair eating breakfast.  VITAL SIGNS: Temp:  [97.4 F (36.3 C)-98.4 F (36.9 C)] 98.4 F (36.9 C) (12/21 0808) Pulse Rate:  [47-153] 71 (12/21 0808) Resp:  [20-27] 21 (12/21 0808) BP: (61-118)/(33-92) 96/42 mmHg (12/21 0808) SpO2:  [93 %-99 %] 99 % (12/21 0808) Weight:  [86 kg (189 lb 9.5 oz)-88.6 kg (195 lb 5.2 oz)] 88.6 kg (195 lb 5.2 oz) (12/21  0300) HEMODYNAMICS:   VENTILATOR SETTINGS:   INTAKE / OUTPUT:  Intake/Output Summary (Last 24 hours) at 08/17/14 5465 Last data filed at 08/17/14 0900  Gross per 24 hour  Intake 850.86 ml  Output   1023 ml  Net -172.14 ml    PHYSICAL EXAMINATION: General:  63 year old male, in no acute distress.  Neuro:  Awake, alert, no focal def  HEENT:  [place], no JVd  Cardiovascular:  Tachy irreg  Lungs:  CTA bilaterally, less BS on the right Abdomen:  Non-tender + bowel sounds  Musculoskeletal:  Intact, + prior right AKA  Skin:  Intact   LABS:  CBC  Recent Labs Lab 08/15/14 0245 08/16/14 0430 08/17/14 0320  WBC 10.4 9.0 10.7*  HGB 7.7* 8.0* 8.5*  HCT 23.9* 24.5* 25.7*  PLT 201 194 193   Coag's  Recent Labs Lab 08/15/14 0245 08/15/14 1510 08/17/14 0320  INR 4.42* 1.80* 1.38   BMET  Recent Labs Lab 08/13/14 1402 08/15/14 0245 08/17/14 0320  NA 135* 130* 136*  K 3.8 4.7 4.2  CL 93* 87* 94*  CO2 24 23 25   BUN 24* 45* 27*  CREATININE 4.79* 7.21* 5.40*  GLUCOSE 247* 242* 172*   Electrolytes  Recent Labs Lab 08/13/14 1402 08/15/14 0245 08/17/14 0320  CALCIUM 8.6 8.4 8.9  PHOS  --   --  4.1   Sepsis Markers  Recent Labs Lab 08/15/14 1907  LATICACIDVEN 1.6   ABG No results for input(s): PHART, PCO2ART, PO2ART in the last 168 hours. Liver Enzymes  Recent Labs Lab 08/13/14 0403 08/15/14  0245 08/17/14 0320  AST 34 34  --   ALT 20 24  --   ALKPHOS 168* 151*  --   BILITOT 1.5* 2.0*  --   ALBUMIN 2.5* 2.1* 1.9*   Cardiac Enzymes  Recent Labs Lab 08/13/14 0403 08/13/14 1402 08/15/14 0245  TROPONINI <0.30 <0.30  --   PROBNP  --   --  >70000.0*   Glucose  Recent Labs Lab 08/15/14 2103 08/16/14 0813 08/16/14 1324 08/16/14 1721 08/16/14 2120 08/17/14 0811  GLUCAP 217* 170* 141* 176* 202* 154*    Imaging Dg Chest 1 View  08/16/2014   CLINICAL DATA:  Status post thoracentesis  EXAM: CHEST - 1 VIEW  COMPARISON:  August 13, 2014   FINDINGS: There is no appreciable pneumothorax. There is less effusion on the right compared to recent prior study. There is asymmetric edema in the right lung with patchy consolidation in the right mid and lower lung zones. On the left, there is atelectatic change in the lateral base region. There is a slight degree of interstitial edema on the left. Heart is enlarged. Patient is status post mitral valve replacement as well as coronary artery bypass grafting. There is a degree of pulmonary venous hypertension. No adenopathy.  IMPRESSION: Less effusion on the right compared to recent prior study. There is right lower lobe consolidation. There is a degree of underlying congestive heart failure. There is somewhat more edema on the right than on the left. No pneumothorax.   Electronically Signed   By: Lowella Grip M.D.   On: 08/16/2014 11:01   US Thoracentesis Asp Pleural Space W/img Guide  08/16/2014   INDICATION: Patient with history of end-stage renal disease, CHF, chronic right effusion. Request is made for diagnostic and therapeutic right thoracentesis.  EXAM: ULTRASOUND GUIDED DIAGNOSTIC AND THERAPEUTIC RIGHT THORACENTESIS  COMPARISON:  None.  MEDICATIONS: None  COMPLICATIONS: None immediate  TECHNIQUE: Informed written consent was obtained from the patient after a discussion of the risks, benefits and alternatives to treatment. A timeout was performed prior to the initiation of the procedure.  Initial ultrasound scanning demonstrates a moderate-to-large right pleural effusion. The lower chest was prepped and draped in the usual sterile fashion. 1% lidocaine was used for local anesthesia.  Under direct ultrasound guidance, a 19 gauge, 7-cm, Yueh catheter was introduced. An ultrasound image was saved for documentation purposes. The thoracentesis was performed. The catheter was removed and a dressing was applied. The patient tolerated the procedure well without immediate post procedural complication. The  patient was escorted to have an upright chest radiograph.  FINDINGS: A total of approximately 1 liter of thick, dark bloody fluid was removed. Only the above amount of fluid could be aspirated at this time. Requested samples were sent to the laboratory.  IMPRESSION: Successful ultrasound-guided diagnostic and therapeutic right sided thoracentesis yielding 1 liter of pleural fluid.  Read by: Rowe Robert, PA-C   Electronically Signed   By: Markus Daft M.D.   On: 08/16/2014 10:31     ASSESSMENT / PLAN:  PULMONARY OETT  A: Loculated right pleural effusion. Old hemothorax. Seen by thoracic surgery, likely now w/ secondary infection of pleural space  Thora done, results noted, exudative by protein but not LDH criteria, cell counts are consistent with a para-pneumonic effusion but pH is normal and LDH is normal which is quite inconsistent with infected pleural space.  WBC normalized essentially and there is no fever even without anti-biotics, this would all be inconsistent with an infection in the  pleural space.  This is likely the natural progression of a hemothorax (high PMNs remains baffling to me however). P:   Wean FIo2 Pulm hygiene F/U on pleural fluid cultures. See discussion above, would not pursue pleural space as a source of infection any further at this point. Thoracic surg following and will defer further needs for interventions to CVTS.  CARDIOVASCULAR CVL none A:  Hypotension/shock. Resolved now, suspect related to chronic process, sepsis unlikely at this point.  AF w/ RVR Known ICM w/ EF 15-20% P:  Repeat CBC Cortisol level 34.3, no need for stress dose steroids Continue midodrine Cont tele  RENAL A:  ESRD P:   HD per renal   GASTROINTESTINAL A:  No acute  P:   Diet as tolerated   HEMATOLOGIC A:   Anemia of chronic disease Coumadin induced coagulopathy: s/p vit K  P:  Trend INR  Trend CBC May consider IV heparin now that INR is normal and restart of coumadin if  CVTS will not intervene.  INFECTIOUS A:  Septic shock in setting of probable infected loculated effusion from old hemothorax   P:   BCx2 12/17>>> Cefepime 12/17>>>12/19 vanc 12/17>>>12/19 Monitor of abx for now.  ENDOCRINE A:  DM w/ hyperglycemia  P:   SSI protocol   NEUROLOGIC A:  No acute  P:   RASS goal: na Supportive care   TODAY'S SUMMARY:  Very unlikely that chest space is infected, CVTS to evaluate, hold off abx, hypotension is a chronic process for patient, specially with how well mental status is doing.   Rush Farmer, M.D. [facility] Pulmonary/Critical Care Medicine. Pager: [phone]. After hours pager: [phone].  08/17/2014, 9:57 AM

## 2014-08-17 NOTE — Progress Notes (Signed)
ANTICOAGULATION CONSULT NOTE - Initial Consult ANTIBIOTIC CONSULT NOTE - Follow up [place] for Heparin and Coumadin, Vancomycin and Cefepime Indication: atrial fibrillation, rule out sepsis/pneumonia  Allergies  Allergen Reactions  . Bacitracin Other (See Comments)    unknown  . Norvasc [Amlodipine Besylate] Other (See Comments)    unknown    Patient Measurements: Height: 6\' 4"  (193 cm) Weight: 195 lb 5.2 oz (88.6 kg) IBW/kg (Calculated) : 86.8 Heparin Dosing Weight: 88.6kg  Vital Signs: Temp: 98.6 F (37 C) (12/21 1100) Temp Source: Oral (12/21 1100) BP: 94/43 mmHg (12/21 1100) Pulse Rate: 74 (12/21 1100)  Labs:  Recent Labs  08/15/14 0245 08/15/14 1510 08/16/14 0430 08/17/14 0320 08/17/14 1022  HGB 7.7*  --  8.0* 8.5*  --   HCT 23.9*  --  24.5* 25.7*  --   PLT 201  --  194 193  --   LABPROT 42.5* 21.1*  --  17.1*  --   INR 4.42* 1.80*  --  1.38  --   CREATININE 7.21*  --   --  5.40* 5.98*    Estimated Creatinine Clearance: 15.1 mL/min (by C-G formula based on Cr of 5.98).   Medical History: Past Medical History  Diagnosis Date  . UGI bleed 02/12/11    On anticoagulation; EGD w/snare polypectomy-multiple polypoid lesions antrum(benign), Chronic active gastritis (NEGATIVE H pylori)  . Anemia, chronic disease   . Sepsis due to enterococcus 02/11/11  . Atrial fibrillation     On coumadin  . Coronary atherosclerosis of native coronary artery     Multivessel status post CABG  . Cardiomyopathy     LVEF 40-45% 01/2011  . Type 2 diabetes mellitus   . Essential hypertension, benign   . Hyperlipemia   . Gout   . S/P patent foramen ovale closure   . Hyperbilirubinemia 08/10/2013  . SBO (small bowel obstruction) 01/2014  . 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 Clostridium difficile colitis 01/2014  . Diverticulosis   . Internal hemorrhoids   . ESRD on hemodialysis     Started dialysis sometime around 2008-2009.  Gets HD in [place], [place] with Dr  Hinda Lenis on a TTS schedule.  Runs 3.5 hours with dry wt 86.5 kg as of June 2015.  Had a L arm AVF which didn't work and has been using his R forearm AVF for several years.     Assessment: 63yom on coumadin pta for afib, admitted with a supratherapeutic INR. Coumadin held (last dose 12/16) and INR reversed pending a thoracentesis. He received 10mg  vitamin k on 12/19. Thoracentesis done yesterday which yielded 1 liter of dark bloody fluid. INR is down to 1.3 - coumadin to resume today with heparin bridge as patient will undergo a TEE/DCCV tomorrow for his afib. He may require higher coumadin doses initially to overcome vitamin k resistance, but don't want to be too aggressive as he is on IV amiodarone. Home dose: 1.25mg  daily (1/2 of 2.5mg  tablet)  Also continues on day #5 antibiotics for pneumonia, possible sepsis. Vancomycin level is at goal. Had an extra HD session yesterday but per patient will get back on usual schedule of TTS starting tomorrow.  Vancomycin 12/17>> 12/21 VR = 15.2 Cefepime 12/17>>  12/17 BCx>>ngtd 12/20 pleural fluid>>ngtd MRSA +   Goal of Therapy:  INR 2-3 Heparin level 0.3-0.7 units/ml Monitor platelets by anticoagulation protocol: Yes  Pre- HD vancomycin level 15-25   Plan:  1) Received sq heparin 5000 units at 0608 so  NO bolus, begin heparin at 1400 units/heer 2) Check 8 hour heparin level 3) Coumadin 4mg  x 1 4) Daily INR 5) Vancomycin 1g qHD TTS 6) Cefepime 2g qHD TTS  [name] 08/17/2014,1:07 PM

## 2014-08-17 NOTE — Progress Notes (Signed)
DAILY PROGRESS NOTE  Subjective:  HR better controlled today, now in the 100-110 range. Remains on IV amiodarone. Dialyzed yesterday with another 3500 cc of UF. S/p thoracentesis yesterday which revealed bloody fluid. Reviewed his records, there is evidence for sinus rhythm earlier this year - he most likely has valvular AF (failed MAZE in the past, s/p AVR).  Objective:  Temp:  [97.4 F (36.3 C)-98.4 F (36.9 C)] 98.4 F (36.9 C) (12/21 0808) Pulse Rate:  [47-153] 71 (12/21 0808) Resp:  [20-27] 21 (12/21 0808) BP: (61-118)/(33-92) 96/42 mmHg (12/21 0808) SpO2:  [93 %-99 %] 99 % (12/21 0808) Weight:  [189 lb 9.5 oz (86 kg)-195 lb 5.2 oz (88.6 kg)] 195 lb 5.2 oz (88.6 kg) (12/21 0300) Weight change: -6 lb 6.3 oz (-2.9 kg)  Intake/Output from previous day: 12/20 0701 - 12/21 0700 In: 817.5 [P.O.:80; I.V.:307.5; Blood:430] Out: 1023   Intake/Output from this shift: Total I/O In: 33.4 [I.V.:33.4] Out: -   Medications: Current Facility-Administered Medications  Medication Dose Route Frequency Provider Last Rate Last Dose  . albuterol (PROVENTIL) (2.5 MG/3ML) 0.083% nebulizer solution 2.5 mg  2.5 mg Nebulization Q2H PRN Nimish C Gosrani, MD      . allopurinol (ZYLOPRIM) tablet 100 mg  100 mg Oral Daily Nimish C Gosrani, MD   100 mg at 08/17/14 1042  . amiodarone (NEXTERONE PREMIX) 360 MG/200ML (1.8 mg/mL) IV infusion  30 mg/hr Intravenous Continuous Gardiner Barefoot, NP 16.7 mL/hr at 08/17/14 0900 30 mg/hr at 08/17/14 0900  . atorvastatin (LIPITOR) tablet 20 mg  20 mg Oral QPM Nimish C Gosrani, MD   20 mg at 08/16/14 1727  . cinacalcet (SENSIPAR) tablet 30 mg  30 mg Oral Q breakfast Nimish C Anastasio Champion, MD   30 mg at 08/17/14 0801  . folic acid (FOLVITE) tablet 1 mg  1 mg Oral Daily Nimish C Gosrani, MD   1 mg at 08/17/14 1042  . guaiFENesin (MUCINEX) 12 hr tablet 600 mg  600 mg Oral BID Phillips Climes, MD   600 mg at 08/17/14 1042  . HYDROcodone-acetaminophen (NORCO/VICODIN)  5-325 MG per tablet 1 tablet  1 tablet Oral Q6H PRN Nimish C Gosrani, MD      . insulin aspart (novoLOG) injection 0-5 Units  0-5 Units Subcutaneous QHS Doree Albee, MD   2 Units at 08/16/14 2144  . insulin aspart (novoLOG) injection 0-9 Units  0-9 Units Subcutaneous TID WC Doree Albee, MD   2 Units at 08/17/14 [phone]  . insulin glargine (LANTUS) injection 10 Units  10 Units Subcutaneous QHS Doree Albee, MD   10 Units at 08/16/14 2133  . metoprolol tartrate (LOPRESSOR) tablet 75 mg  75 mg Oral BID Gardiner Barefoot, NP   75 mg at 08/17/14 1042  . midodrine (PROAMATINE) tablet 10 mg  10 mg Oral BID WC Sol Blazing, MD   10 mg at 08/17/14 0801  . ondansetron (ZOFRAN) tablet 4 mg  4 mg Oral Q6H PRN Nimish C Gosrani, MD       Or  . ondansetron (ZOFRAN) injection 4 mg  4 mg Intravenous Q6H PRN Nimish Luther Parody, MD   4 mg at 08/14/14 1723  . pantoprazole (PROTONIX) EC tablet 40 mg  40 mg Oral BID Nimish C Anastasio Champion, MD   40 mg at 08/17/14 1042  . phytonadione (VITAMIN K) 5 mg in dextrose 5 % 50 mL IVPB  5 mg Intravenous Once Nimish Luther Parody, MD   5  mg at 08/16/14 0736  . sevelamer carbonate (RENVELA) tablet 800 mg  800 mg Oral TID WC Nimish C Anastasio Champion, MD   800 mg at 08/17/14 0801  . sodium chloride 0.9 % injection 3 mL  3 mL Intravenous Q12H Nimish C Gosrani, MD   3 mL at 08/17/14 1043  . traMADol (ULTRAM) tablet 50 mg  50 mg Oral Q8H PRN Doree Albee, MD      . traZODone (DESYREL) tablet 50 mg  50 mg Oral QHS Doree Albee, MD   50 mg at 08/16/14 2133    Physical Exam: General appearance: alert and no distress Lungs: diminished breath sounds RLL, RML and RUL Heart: irregularly irregular rhythm Extremities: edema trace LLE edema, right BKA  Lab Results: Results for orders placed or performed during the hospital encounter of 08/13/14 (from the past 48 hour(s))  Glucose, capillary     Status: Abnormal   Collection Time: 08/15/14 12:55 PM  Result Value Ref Range    Glucose-Capillary 151 (H) 70 - 99 mg/dL  Protime-INR     Status: Abnormal   Collection Time: 08/15/14  3:10 PM  Result Value Ref Range   Prothrombin Time 21.1 (H) 11.6 - 15.2 seconds   INR 1.80 (H) 0.00 - 1.49  Glucose, capillary     Status: Abnormal   Collection Time: 08/15/14  3:16 PM  Result Value Ref Range   Glucose-Capillary 202 (H) 70 - 99 mg/dL  Lactic acid, plasma     Status: None   Collection Time: 08/15/14  7:07 PM  Result Value Ref Range   Lactic Acid, Venous 1.6 0.5 - 2.2 mmol/L  Cortisol     Status: None   Collection Time: 08/15/14  7:07 PM  Result Value Ref Range   Cortisol, Plasma 34.3 ug/dL    Comment: (NOTE) AM:  4.3 - 22.4 ug/dL PM:  3.1 - 16.7 ug/dL Performed at [facility]   Glucose, capillary     Status: Abnormal   Collection Time: 08/15/14  9:03 PM  Result Value Ref Range   Glucose-Capillary 217 (H) 70 - 99 mg/dL   Comment 1 Capillary Sample   Lactate dehydrogenase     Status: None   Collection Time: 08/16/14  4:30 AM  Result Value Ref Range   LDH 177 94 - 250 U/L  CBC     Status: Abnormal   Collection Time: 08/16/14  4:30 AM  Result Value Ref Range   WBC 9.0 4.0 - 10.5 K/uL   RBC 2.65 (L) 4.22 - 5.81 MIL/uL   Hemoglobin 8.0 (L) 13.0 - 17.0 g/dL   HCT 24.5 (L) 39.0 - 52.0 %   MCV 92.5 78.0 - 100.0 fL   MCH 30.2 26.0 - 34.0 pg   MCHC 32.7 30.0 - 36.0 g/dL   RDW 16.4 (H) 11.5 - 15.5 %   Platelets 194 150 - 400 K/uL  Glucose, capillary     Status: Abnormal   Collection Time: 08/16/14  8:13 AM  Result Value Ref Range   Glucose-Capillary 170 (H) 70 - 99 mg/dL  Body fluid culture     Status: None (Preliminary result)   Collection Time: 08/16/14 10:33 AM  Result Value Ref Range   Specimen Description PLEURAL RIGHT FLUID    Special Requests NONE    Gram Stain      FEW WBC PRESENT, PREDOMINANTLY PMN NO SQUAMOUS EPITHELIAL CELLS SEEN NO ORGANISMS SEEN Performed at [facility]  NO GROWTH 1 DAY Performed at  [facility]    Report Status PENDING   Body fluid cell count with differential     Status: Abnormal   Collection Time: 08/16/14 10:34 AM  Result Value Ref Range   Fluid Type-FCT PLEURAL     Comment: RIGHT FLUID CORRECTED ON 12/20 AT 1130: PREVIOUSLY REPORTED AS Pleural R    Color, Fluid RED (A) YELLOW   Appearance, Fluid TURBID (A) CLEAR   WBC, Fluid 4076 (H) 0 - 1000 cu mm   Neutrophil Count, Fluid 79 (H) 0 - 25 %   Lymphs, Fluid 11 %   Monocyte-Macrophage-Serous Fluid 10 (L) 50 - 90 %  Protein, fluid - pleural or peritoneal     Status: None   Collection Time: 08/16/14 10:34 AM  Result Value Ref Range   Total protein, fluid 4.8 g/dL    Comment: (NOTE) No normal range established for this test Results should be evaluated in conjunction with serum values    Fluid Type-FTP PLEURAL     Comment: RIGHT FLUID CORRECTED ON 12/20 AT 1130: PREVIOUSLY REPORTED AS Pleural R   Lactate dehydrogenase, fluid - pleural or peritoneal     Status: None   Collection Time: 08/16/14 10:35 AM  Result Value Ref Range   LD, Fluid 10 3 - 23 U/L    Comment: (NOTE) Results should be evaluated in conjunction with serum values    Fluid Type-FLDH PLEURAL     Comment: RIGHT FLUID CORRECTED ON 12/20 AT 1133: PREVIOUSLY REPORTED AS Pleural R   pH, body fluid     Status: None   Collection Time: 08/16/14 10:35 AM  Result Value Ref Range   pH, Fluid Type      CORRECTED ON 12/20 AT 1135: PREVIOUSLY REPORTED AS PLEURAL R    Comment: CORRECTED ON 12/20 AT 2149: PREVIOUSLY REPORTED AS PLEURAL RIGHT FLUID, CORRECTED ON 12/20 AT 1135: PREVIOUSLY REPORTED AS Pleural R   pH, Fluid 8.00     Comment: Performed at [facility]  Glucose, capillary     Status: Abnormal   Collection Time: 08/16/14  1:24 PM  Result Value Ref Range   Glucose-Capillary 141 (H) 70 - 99 mg/dL  Prepare RBC     Status: None   Collection Time: 08/16/14  3:00 PM  Result Value Ref Range   Order Confirmation ORDER  PROCESSED BY BLOOD BANK   Protein, total     Status: None   Collection Time: 08/16/14  4:16 PM  Result Value Ref Range   Total Protein 6.6 6.0 - 8.3 g/dL  Type and screen     Status: None (Preliminary result)   Collection Time: 08/16/14  4:16 PM  Result Value Ref Range   ABO/RH(D) O POS    Antibody Screen NEG    Sample Expiration 08/19/2014    Unit Number [id number]    Blood Component Type RBC CPDA1, LR    Unit division 00    Status of Unit ISSUED    Transfusion Status OK TO TRANSFUSE    Crossmatch Result Compatible   Glucose, capillary     Status: Abnormal   Collection Time: 08/16/14  5:21 PM  Result Value Ref Range   Glucose-Capillary 176 (H) 70 - 99 mg/dL   Comment 1 Capillary Sample   Glucose, capillary     Status: Abnormal   Collection Time: 08/16/14  9:20 PM  Result Value Ref Range   Glucose-Capillary 202 (H) 70 - 99 mg/dL  Comment 1 Capillary Sample   CBC     Status: Abnormal   Collection Time: 08/17/14  3:20 AM  Result Value Ref Range   WBC 10.7 (H) 4.0 - 10.5 K/uL   RBC 2.96 (L) 4.22 - 5.81 MIL/uL   Hemoglobin 8.5 (L) 13.0 - 17.0 g/dL   HCT 25.7 (L) 39.0 - 52.0 %   MCV 86.8 78.0 - 100.0 fL   MCH 28.7 26.0 - 34.0 pg   MCHC 33.1 30.0 - 36.0 g/dL   RDW 16.8 (H) 11.5 - 15.5 %   Platelets 193 150 - 400 K/uL  Renal function panel     Status: Abnormal   Collection Time: 08/17/14  3:20 AM  Result Value Ref Range   Sodium 136 (L) 137 - 147 mEq/L   Potassium 4.2 3.7 - 5.3 mEq/L   Chloride 94 (L) 96 - 112 mEq/L   CO2 25 19 - 32 mEq/L   Glucose, Bld 172 (H) 70 - 99 mg/dL   BUN 27 (H) 6 - 23 mg/dL   Creatinine, Ser 5.40 (H) 0.50 - 1.35 mg/dL   Calcium 8.9 8.4 - 10.5 mg/dL   Phosphorus 4.1 2.3 - 4.6 mg/dL   Albumin 1.9 (L) 3.5 - 5.2 g/dL   GFR calc non Af Amer 10 (L) >90 mL/min   GFR calc Af Amer 12 (L) >90 mL/min    Comment: (NOTE) The eGFR has been calculated using the CKD EPI equation. This calculation has not been validated in all clinical  situations. eGFR's persistently <90 mL/min signify possible Chronic Kidney Disease.    Anion gap 17 (H) 5 - 15  Protime-INR     Status: Abnormal   Collection Time: 08/17/14  3:20 AM  Result Value Ref Range   Prothrombin Time 17.1 (H) 11.6 - 15.2 seconds   INR 1.38 0.00 - 1.49  Vancomycin, random     Status: None   Collection Time: 08/17/14  3:20 AM  Result Value Ref Range   Vancomycin Rm 15.2 ug/mL    Comment:        Random Vancomycin therapeutic range is dependent on dosage and time of specimen collection. A peak range is 20.0-40.0 ug/mL A trough range is 5.0-15.0 ug/mL          Glucose, capillary     Status: Abnormal   Collection Time: 08/17/14  8:11 AM  Result Value Ref Range   Glucose-Capillary 154 (H) 70 - 99 mg/dL   Comment 1 Capillary Sample     Imaging: Dg Chest 1 View  08/16/2014   CLINICAL DATA:  Status post thoracentesis  EXAM: CHEST - 1 VIEW  COMPARISON:  August 13, 2014  FINDINGS: There is no appreciable pneumothorax. There is less effusion on the right compared to recent prior study. There is asymmetric edema in the right lung with patchy consolidation in the right mid and lower lung zones. On the left, there is atelectatic change in the lateral base region. There is a slight degree of interstitial edema on the left. Heart is enlarged. Patient is status post mitral valve replacement as well as coronary artery bypass grafting. There is a degree of pulmonary venous hypertension. No adenopathy.  IMPRESSION: Less effusion on the right compared to recent prior study. There is right lower lobe consolidation. There is a degree of underlying congestive heart failure. There is somewhat more edema on the right than on the left. No pneumothorax.   Electronically Signed   By: Lowella Grip M.D.  On: 08/16/2014 11:01   US Thoracentesis Asp Pleural Space W/img Guide  08/16/2014   INDICATION: Patient with history of end-stage renal disease, CHF, chronic right effusion.  Request is made for diagnostic and therapeutic right thoracentesis.  EXAM: ULTRASOUND GUIDED DIAGNOSTIC AND THERAPEUTIC RIGHT THORACENTESIS  COMPARISON:  None.  MEDICATIONS: None  COMPLICATIONS: None immediate  TECHNIQUE: Informed written consent was obtained from the patient after a discussion of the risks, benefits and alternatives to treatment. A timeout was performed prior to the initiation of the procedure.  Initial ultrasound scanning demonstrates a moderate-to-large right pleural effusion. The lower chest was prepped and draped in the usual sterile fashion. 1% lidocaine was used for local anesthesia.  Under direct ultrasound guidance, a 19 gauge, 7-cm, Yueh catheter was introduced. An ultrasound image was saved for documentation purposes. The thoracentesis was performed. The catheter was removed and a dressing was applied. The patient tolerated the procedure well without immediate post procedural complication. The patient was escorted to have an upright chest radiograph.  FINDINGS: A total of approximately 1 liter of thick, dark bloody fluid was removed. Only the above amount of fluid could be aspirated at this time. Requested samples were sent to the laboratory.  IMPRESSION: Successful ultrasound-guided diagnostic and therapeutic right sided thoracentesis yielding 1 liter of pleural fluid.  Read by: Rowe Robert, PA-C   Electronically Signed   By: Markus Daft M.D.   On: 08/16/2014 10:31    Assessment:  Principal Problem:   Loculated pleural effusion Active Problems:   Atrial fibrillation   [place] term current use of anticoagulant therapy   ESRD (end stage renal disease) on dialysis   DM type 2 causing ESRD   Septic shock   Pleural effusion, right   Atrial fibrillation with rapid ventricular response   Absolute anemia   HCAP (healthcare-associated pneumonia)   S/P thoracentesis   Hemodialysis-associated hypotension   Plan:  1. Continue IV amiodarone. Suspect his has valvular AF - modified  atrial substrate with prior MAZE, will be difficult to control. Discussed TEE/Cardioversion with him, including risk and benefit ratio and he is agreeable. Since he is adequately loaded on amiodarone, I think he will have the best possible chance of converting. Will re-start IV heparin today and warfarin (DOAC's are contraindicated given likely valvular AF). Plan for TEE/Cardioversion tomorrow after dialysis. Keep NPO MN.  Time Spent Directly with Patient:  15 minutes  Length of Stay:  LOS: 4 days   Pixie Casino, MD, [facility] Attending Cardiologist [facility]  HILTY,Kenneth C 08/17/2014, 11:02 AM

## 2014-08-18 ENCOUNTER — Encounter (HOSPITAL_COMMUNITY): Payer: Self-pay | Admitting: *Deleted

## 2014-08-18 ENCOUNTER — Encounter (HOSPITAL_COMMUNITY)
Admission: EM | Disposition: A | Discharge status: Home / Self Care | Payer: Medicare Other | Source: Home / Self Care | Attending: Internal Medicine

## 2014-08-18 DIAGNOSIS — B9562 Methicillin resistant Staphylococcus aureus infection as the cause of diseases classified elsewhere: Secondary | ICD-10-CM

## 2014-08-18 DIAGNOSIS — I950 Idiopathic hypotension: Secondary | ICD-10-CM | POA: Insufficient documentation

## 2014-08-18 LAB — CULTURE, BLOOD (ROUTINE X 2)
CULTURE: NO GROWTH
Culture: NO GROWTH

## 2014-08-18 LAB — GLUCOSE, CAPILLARY
Glucose-Capillary: 158 mg/dL — ABNORMAL HIGH (ref 70–99)
Glucose-Capillary: 166 mg/dL — ABNORMAL HIGH (ref 70–99)
Glucose-Capillary: 165 mg/dL — ABNORMAL HIGH (ref 70–99)
Glucose-Capillary: 150 mg/dL — ABNORMAL HIGH (ref 70–99)

## 2014-08-18 LAB — CBC
HCT: 24.2 % — ABNORMAL LOW (ref 39.0–52.0)
Hemoglobin: 8.1 g/dL — ABNORMAL LOW (ref 13.0–17.0)
MCH: 28 pg (ref 26.0–34.0)
MCHC: 33.5 g/dL (ref 30.0–36.0)
MCV: 83.7 fL (ref 78.0–100.0)
Platelets: 189 10*3/uL (ref 150–400)
RBC: 2.89 MIL/uL — ABNORMAL LOW (ref 4.22–5.81)
RDW: 16.8 % — AB (ref 11.5–15.5)
WBC: 10.7 10*3/uL — ABNORMAL HIGH (ref 4.0–10.5)

## 2014-08-18 LAB — HEPARIN LEVEL (UNFRACTIONATED)
Heparin Unfractionated: <0.1 IU/mL — ABNORMAL LOW (ref 0.30–0.70)
Heparin Unfractionated: 0.16 IU/mL — ABNORMAL LOW (ref 0.30–0.70)
Heparin Unfractionated: <0.1 IU/mL — ABNORMAL LOW (ref 0.30–0.70)

## 2014-08-18 LAB — PREPARE RBC (CROSSMATCH)

## 2014-08-18 LAB — PROTIME-INR
INR: 1.5 — ABNORMAL HIGH (ref 0.00–1.49)
Prothrombin Time: 18.2 seconds — ABNORMAL HIGH (ref 11.6–15.2)

## 2014-08-18 SURGERY — CANCELLED PROCEDURE
Anesthesia: Monitor Anesthesia Care

## 2014-08-18 MED ORDER — VANCOMYCIN HCL IN DEXTROSE 1-5 GM/200ML-% IV SOLN
1000.0000 mg | INTRAVENOUS | Status: DC
  Administered 2014-08-20: 1000 mg via INTRAVENOUS
  Filled 2014-08-18 (×2): qty 200

## 2014-08-18 MED ORDER — SODIUM CHLORIDE 0.9 % IV SOLN
Freq: Once | INTRAVENOUS | Status: AC
  Administered 2014-08-18: 16:00:00 via INTRAVENOUS

## 2014-08-18 MED ORDER — HEPARIN (PORCINE) IN NACL 100-0.45 UNIT/ML-% IJ SOLN
2500.0000 [IU]/h | INTRAMUSCULAR | Status: DC
  Administered 2014-08-18: 2350 [IU]/h via INTRAVENOUS
  Administered 2014-08-19: 2500 [IU]/h via INTRAVENOUS
  Filled 2014-08-18 (×5): qty 250

## 2014-08-18 MED ORDER — SODIUM CHLORIDE 0.9 % IV BOLUS (SEPSIS)
250.0000 mL | Freq: Once | INTRAVENOUS | Status: AC
  Administered 2014-08-18: 250 mL via INTRAVENOUS

## 2014-08-18 MED ORDER — WARFARIN SODIUM 2.5 MG PO TABS
2.5000 mg | ORAL_TABLET | Freq: Once | ORAL | Status: AC
  Administered 2014-08-18: 2.5 mg via ORAL
  Filled 2014-08-18 (×2): qty 1

## 2014-08-18 MED ORDER — ACETAMINOPHEN 325 MG PO TABS
650.0000 mg | ORAL_TABLET | Freq: Once | ORAL | Status: AC
  Administered 2014-08-18: 650 mg via ORAL

## 2014-08-18 NOTE — Progress Notes (Addendum)
ANTICOAGULATION CONSULT NOTE - Follow up [place] for Heparin and Coumadin Indication: atrial fibrillation  Allergies  Allergen Reactions  . Bacitracin Other (See Comments)    unknown  . Norvasc [Amlodipine Besylate] Other (See Comments)    unknown    Patient Measurements: Height: 6\' 4"  (193 cm) Weight: 195 lb 12.3 oz (88.8 kg) IBW/kg (Calculated) : 86.8 Heparin Dosing Weight: 88.6kg  Vital Signs: Temp: 99.2 F (37.3 C) (12/22 1225) Temp Source: Oral (12/22 1225) BP: 109/43 mmHg (12/22 1316) Pulse Rate: 111 (12/22 1316)  Labs:  Recent Labs  08/15/14 1510  08/16/14 0430 08/17/14 0320 08/17/14 1022 08/17/14 1930 08/18/14 0249 08/18/14 1207  HGB  --   < > 8.0* 8.5*  --   --  8.1*  --   HCT  --   --  24.5* 25.7*  --   --  24.2*  --   PLT  --   --  194 193  --   --  189  --   LABPROT 21.1*  --   --  17.1*  --   --  18.2*  --   INR 1.80*  --   --  1.38  --   --  1.50*  --   HEPARINUNFRC  --   --   --   --   --  <0.10* <0.10* 0.16*  CREATININE  --   --   --  5.40* 5.98*  --   --   --   < > = values in this interval not displayed.  Estimated Creatinine Clearance: 15.1 mL/min (by C-G formula based on Cr of 5.98).  Assessment: 63yom on coumadin pta for afib, admitted with a supratherapeutic INR. Coumadin held (last dose 12/16) and INR reversed pending a thoracentesis. He received 10mg  vitamin k on 12/19. Thoracentesis done 12/20 which yielded 1 liter of dark bloody fluid. Coumadin resumed yesterday with heparin bridge pending TEE/DCCV today. Heparin level remains below goal despite being on almost 23 units/kg/hr. No issues with infusion noted. INR 1.5 after first dose of coumadin. Continues on IV amiodarone.  Home dose: 1.25mg  daily (1/2 of 2.5mg  tablet)   Goal of Therapy:  INR 2-3 Heparin level 0.3-0.7 units/ml Monitor platelets by anticoagulation protocol: Yes     Plan:  1) Increase heparin to 2350 units/hr 2) Check 8 hour heparin level 3)  Decrease coumadin to 2.5mg  x 1 tonight 4) Daily INR  [name] 08/18/2014,1:36 PM  Addendum:  Vancomycin and Cefepime initially discontinued this morning but now patient growing MRSA in his pleural fluid culture. ID consulted and added back vancomycin which is to continue through 12/31. Patient received 1000mg  of vancomycin with dialysis today (prior to it being discontinued). Usual HD schedule TTS.  Vancomycin 12/17>> 12/21 VR = 15.2 Cefepime 12/17>>12/22  12/17 BCx>>ngtd 12/20 pleural fluid>> MRSA MRSA +  Plan: 1) Continue with vancomycin 1000mg  qHD TTS 2) Will follow up holiday dialysis schedule and adjust if needed 3) Would check another vancomycin level this weekend to ensure therapeutic  [name] 08/18/2014, 3:20 PM

## 2014-08-18 NOTE — Consult Note (Signed)
[name] for Infectious Disease     Reason for Consult: MRSA in pleural effusion    Referring Physician: Dr. Waldron Labs  Principal Problem:   Loculated pleural effusion Active Problems:   Atrial fibrillation   [place] term current use of anticoagulant therapy   ESRD (end stage renal disease) on dialysis   DM type 2 causing ESRD   Septic shock   Pleural effusion, right   Atrial fibrillation with rapid ventricular response   Absolute anemia   HCAP (healthcare-associated pneumonia)   S/P thoracentesis   Hemodialysis-associated hypotension   Idiopathic hypotension   . allopurinol  100 mg Oral Daily  . atorvastatin  20 mg Oral QPM  . Chlorhexidine Gluconate Cloth  6 each Topical Q0600  . cinacalcet  30 mg Oral Q breakfast  . folic acid  1 mg Oral Daily  . guaiFENesin  600 mg Oral BID  . insulin aspart  0-5 Units Subcutaneous QHS  . insulin aspart  0-9 Units Subcutaneous TID WC  . insulin glargine  10 Units Subcutaneous QHS  . metoprolol  75 mg Oral BID  . midodrine  10 mg Oral BID WC  . mupirocin ointment  1 application Nasal BID  . pantoprazole  40 mg Oral BID  . phytonadione (VITAMIN K) IV  5 mg Intravenous Once  . sevelamer carbonate  800 mg Oral TID WC  . sodium chloride  3 mL Intravenous Q12H  . traZODone  50 mg Oral QHS  . warfarin  2.5 mg Oral ONCE-1800  . Warfarin - Pharmacist Dosing Inpatient   Does not apply q1800    Recommendations: Vancomycin after dialysis for 2 weeks through 12/31 Routine HIV, hep C screening  Assessment: MRSA in pleural effusion.  Antibiotics: Vancomycin day 6  HPI: [name] [name] is a 63 y.o. male with ESRD on dialysis, afib, CAD, cardiomyopathy, DM who presented 12/17 with abdominal pain.  CT did reveal large loculated pleural effusion.  He was started on empiric vancomycin and did have thoracentesis concerning for hemothorax. WBCs elevated.  Now has grown MRSA.     Review of Systems: A comprehensive review of systems was  negative.  Past Medical History  Diagnosis Date  . UGI bleed 02/12/11    On anticoagulation; EGD w/snare polypectomy-multiple polypoid lesions antrum(benign), Chronic active gastritis (NEGATIVE H pylori)  . Anemia, chronic disease   . Sepsis due to enterococcus 02/11/11  . Atrial fibrillation     On coumadin  . Coronary atherosclerosis of native coronary artery     Multivessel status post CABG  . Cardiomyopathy     LVEF 40-45% 01/2011  . Type 2 diabetes mellitus   . Essential hypertension, benign   . Hyperlipemia   . Gout   . S/P patent foramen ovale closure   . Hyperbilirubinemia 08/10/2013  . SBO (small bowel obstruction) 01/2014  . 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 Clostridium difficile colitis 01/2014  . Diverticulosis   . Internal hemorrhoids   . ESRD on hemodialysis     Started dialysis sometime around 2008-2009.  Gets HD in [place], [place] with Dr Hinda Lenis on a TTS schedule.  Runs 3.5 hours with dry wt 86.5 kg as of June 2015.  Had a L arm AVF which didn't work and has been using his R forearm AVF for several years.      History  Substance Use Topics  . Smoking status: Former Smoker -- 1.00 packs/day for 8 years    Types:  Cigarettes    Start date: 04/27/1969    Quit date: 11/27/2006  . Smokeless tobacco: Never Used     Comment: quit about 5 yrs  . Alcohol Use: No    Family History  Problem Relation Age of Onset  . Diabetes Mother   . Diabetes Father   . Diabetes Sister   . Colon cancer Neg Hx   . Colon polyps Neg Hx    Allergies  Allergen Reactions  . Bacitracin Other (See Comments)    unknown  . Norvasc [Amlodipine Besylate] Other (See Comments)    unknown    OBJECTIVE: Blood pressure 95/45, pulse 72, temperature 99.2 F (37.3 C), temperature source Oral, resp. rate 24, height 6\' 4"  (1.93 m), weight 195 lb 12.3 oz (88.8 kg), SpO2 93 %. General: nad Skin: no rashes Lungs: CTA Cor:irr Abdomen: soft   Microbiology: Recent Results (from the past 240  hour(s))  Blood culture (routine x 2)     Status: None   Collection Time: 08/13/14  5:45 PM  Result Value Ref Range Status   Specimen Description BLOOD LEFT HAND  Final   Special Requests BOTTLES DRAWN AEROBIC ONLY 4CC  Final   Culture NO GROWTH 5 DAYS  Final   Report Status 08/18/2014 FINAL  Final  Blood culture (routine x 2)     Status: None   Collection Time: 08/13/14  5:55 PM  Result Value Ref Range Status   Specimen Description BLOOD LEFT ARM  Final   Special Requests   Final    BOTTLES DRAWN AEROBIC AND ANAEROBIC AEB=8CC ANA=6CC   Culture NO GROWTH 5 DAYS  Final   Report Status 08/18/2014 FINAL  Final  MRSA PCR Screening     Status: Abnormal   Collection Time: 08/14/14  2:05 AM  Result Value Ref Range Status   MRSA by PCR POSITIVE (A) NEGATIVE Final    Comment:        The GeneXpert MRSA Assay (FDA approved for NASAL specimens only), is one component of a comprehensive MRSA colonization surveillance program. It is not intended to diagnose MRSA infection nor to guide or monitor treatment for MRSA infections. RESULT CALLED TO, READ BACK BY AND VERIFIED WITH: D RICE,RN [phone] [facility]   Body fluid culture     Status: None (Preliminary result)   Collection Time: 08/16/14 10:33 AM  Result Value Ref Range Status   Specimen Description PLEURAL RIGHT FLUID  Final   Special Requests NONE  Final   Gram Stain   Final    FEW WBC PRESENT, PREDOMINANTLY PMN NO SQUAMOUS EPITHELIAL CELLS SEEN NO ORGANISMS SEEN Performed at [facility]    Culture   Final    FEW METHICILLIN RESISTANT STAPHYLOCOCCUS AUREUS Note: RIFAMPIN AND GENTAMICIN SHOULD NOT BE USED AS SINGLE DRUGS FOR TREATMENT OF STAPH INFECTIONS. CRITICAL RESULT CALLED TO, READ BACK BY AND VERIFIED WITH: AMANDA RAWSON 08/18/14 1245 BY SMITHERSJ Performed at [facility]    Report Status PENDING  Incomplete    Scharlene Gloss, [place] for Infectious Disease [facility] [email or web address] [phone] pager  [phone] cell 08/18/2014, 2:58 PM

## 2014-08-18 NOTE — Progress Notes (Signed)
ANTICOAGULATION CONSULT NOTE - Follow Up Consult  Pharmacy Consult for heparin Indication: atrial fibrillation  Labs:  Recent Labs  08/15/14 1510  08/16/14 0430 08/17/14 0320 08/17/14 1022 08/17/14 1930 08/18/14 0249  HGB  --   < > 8.0* 8.5*  --   --  8.1*  HCT  --   --  24.5* 25.7*  --   --  24.2*  PLT  --   --  194 193  --   --  189  LABPROT 21.1*  --   --  17.1*  --   --  18.2*  INR 1.80*  --   --  1.38  --   --  1.50*  HEPARINUNFRC  --   --   --   --   --  <0.10* <0.10*  CREATININE  --   --   --  5.40* 5.98*  --   --   < > = values in this interval not displayed.   Assessment: 63yo male remains undetectable on heparin despite rate increase; Hgb low but stable.  Goal of Therapy:  Heparin level 0.3-0.7 units/ml   Plan:  Will increase heparin gtt by 4 units/kg/hr to 2000 units/hr and check level in [place], PharmD, BCPS  08/18/2014,4:31 AM

## 2014-08-18 NOTE — Progress Notes (Signed)
DAILY PROGRESS NOTE  Subjective:  Hypotensive after dialysis again today, but mentating well. Plan for TEE/DCCV today at 1300.  Objective:  Temp:  [98.2 F (36.8 C)-99.8 F (37.7 C)] 98.2 F (36.8 C) (12/22 1052) Pulse Rate:  [43-110] 110 (12/22 1052) Resp:  [14-30] 24 (12/22 1052) BP: (79-114)/(38-87) 83/52 mmHg (12/22 1052) SpO2:  [93 %-99 %] 97 % (12/22 1052) Weight:  [195 lb 12.3 oz (88.8 kg)-198 lb 6.6 oz (90 kg)] 195 lb 12.3 oz (88.8 kg) (12/22 1052) Weight change: 2 lb 6.8 oz (1.1 kg)  Intake/Output from previous day: 12/21 0701 - 12/22 0700 In: 1402.9 [P.O.:600; I.V.:802.9] Out: -   Intake/Output from this shift: Total I/O In: -  Out: 1500 [Other:1500]  Medications: Current Facility-Administered Medications  Medication Dose Route Frequency Provider Last Rate Last Dose  . 0.9 %  sodium chloride infusion   Intravenous Continuous Pixie Casino, MD 20 mL/hr at 08/17/14 2329    . albuterol (PROVENTIL) (2.5 MG/3ML) 0.083% nebulizer solution 2.5 mg  2.5 mg Nebulization Q2H PRN Nimish C Gosrani, MD      . allopurinol (ZYLOPRIM) tablet 100 mg  100 mg Oral Daily Nimish C Gosrani, MD   100 mg at 08/17/14 1042  . amiodarone (NEXTERONE PREMIX) 360 MG/200ML (1.8 mg/mL) IV infusion  30 mg/hr Intravenous Continuous Gardiner Barefoot, NP 16.7 mL/hr at 08/18/14 0428 30 mg/hr at 08/18/14 0428  . atorvastatin (LIPITOR) tablet 20 mg  20 mg Oral QPM Nimish C Gosrani, MD   20 mg at 08/17/14 1841  . Chlorhexidine Gluconate Cloth 2 % PADS 6 each  6 each Topical Q0600 Phillips Climes, MD   6 each at 08/18/14 0403  . cinacalcet (SENSIPAR) tablet 30 mg  30 mg Oral Q breakfast Nimish C Anastasio Champion, MD   30 mg at 08/17/14 0801  . folic acid (FOLVITE) tablet 1 mg  1 mg Oral Daily Nimish C Gosrani, MD   1 mg at 08/17/14 1042  . guaiFENesin (MUCINEX) 12 hr tablet 600 mg  600 mg Oral BID Phillips Climes, MD   600 mg at 08/17/14 2133  . heparin ADULT infusion 100 units/mL (25000 units/250 mL)   2,000 Units/hr Intravenous Continuous Rogue Bussing, RPH 20 mL/hr at 08/18/14 0432 2,000 Units/hr at 08/18/14 0432  . HYDROcodone-acetaminophen (NORCO/VICODIN) 5-325 MG per tablet 1 tablet  1 tablet Oral Q6H PRN Nimish C Gosrani, MD      . insulin aspart (novoLOG) injection 0-5 Units  0-5 Units Subcutaneous QHS Doree Albee, MD   2 Units at 08/16/14 2144  . insulin aspart (novoLOG) injection 0-9 Units  0-9 Units Subcutaneous TID WC Doree Albee, MD   2 Units at 08/17/14 1842  . insulin glargine (LANTUS) injection 10 Units  10 Units Subcutaneous QHS Doree Albee, MD   10 Units at 08/17/14 2134  . metoprolol tartrate (LOPRESSOR) tablet 75 mg  75 mg Oral BID Gardiner Barefoot, NP   75 mg at 08/17/14 2134  . midodrine (PROAMATINE) tablet 10 mg  10 mg Oral BID WC Sol Blazing, MD   10 mg at 08/18/14 9604  . mupirocin ointment (BACTROBAN) 2 % 1 application  1 application Nasal BID Phillips Climes, MD   1 application at 54/09/81 2141  . ondansetron (ZOFRAN) tablet 4 mg  4 mg Oral Q6H PRN Nimish C Gosrani, MD       Or  . ondansetron (ZOFRAN) injection 4 mg  4 mg Intravenous Q6H PRN Nimish  Luther Parody, MD   4 mg at 08/14/14 1723  . pantoprazole (PROTONIX) EC tablet 40 mg  40 mg Oral BID Doree Albee, MD   40 mg at 08/17/14 2133  . phytonadione (VITAMIN K) 5 mg in dextrose 5 % 50 mL IVPB  5 mg Intravenous Once Doree Albee, MD   5 mg at 08/16/14 0736  . sevelamer carbonate (RENVELA) tablet 800 mg  800 mg Oral TID WC Nimish C Anastasio Champion, MD   800 mg at 08/17/14 1841  . sodium chloride 0.9 % injection 3 mL  3 mL Intravenous Q12H Nimish C Gosrani, MD   3 mL at 08/17/14 1043  . traMADol (ULTRAM) tablet 50 mg  50 mg Oral Q8H PRN Nimish C Gosrani, MD      . traZODone (DESYREL) tablet 50 mg  50 mg Oral QHS Doree Albee, MD   50 mg at 08/17/14 2134  . Warfarin - Pharmacist Dosing Inpatient   Does not apply q1800 [name], [facility]        Physical Exam: General appearance:  alert and no distress Lungs: diminished breath sounds RLL, RML and RUL Heart: irregularly irregular rhythm Extremities: edema trace LLE edema, right BKA  Lab Results: Results for orders placed or performed during the hospital encounter of 08/13/14 (from the past 48 hour(s))  Glucose, capillary     Status: Abnormal   Collection Time: 08/16/14  1:24 PM  Result Value Ref Range   Glucose-Capillary 141 (H) 70 - 99 mg/dL  Prepare RBC     Status: None   Collection Time: 08/16/14  3:00 PM  Result Value Ref Range   Order Confirmation ORDER PROCESSED BY BLOOD BANK   Protein, total     Status: None   Collection Time: 08/16/14  4:16 PM  Result Value Ref Range   Total Protein 6.6 6.0 - 8.3 g/dL  Type and screen     Status: None   Collection Time: 08/16/14  4:16 PM  Result Value Ref Range   ABO/RH(D) O POS    Antibody Screen NEG    Sample Expiration 08/19/2014    Unit Number [id number]    Blood Component Type RBC CPDA1, LR    Unit division 00    Status of Unit ISSUED,FINAL    Transfusion Status OK TO TRANSFUSE    Crossmatch Result Compatible   Glucose, capillary     Status: Abnormal   Collection Time: 08/16/14  5:21 PM  Result Value Ref Range   Glucose-Capillary 176 (H) 70 - 99 mg/dL   Comment 1 Capillary Sample   Glucose, capillary     Status: Abnormal   Collection Time: 08/16/14  9:20 PM  Result Value Ref Range   Glucose-Capillary 202 (H) 70 - 99 mg/dL   Comment 1 Capillary Sample   CBC     Status: Abnormal   Collection Time: 08/17/14  3:20 AM  Result Value Ref Range   WBC 10.7 (H) 4.0 - 10.5 K/uL   RBC 2.96 (L) 4.22 - 5.81 MIL/uL   Hemoglobin 8.5 (L) 13.0 - 17.0 g/dL   HCT 25.7 (L) 39.0 - 52.0 %   MCV 86.8 78.0 - 100.0 fL   MCH 28.7 26.0 - 34.0 pg   MCHC 33.1 30.0 - 36.0 g/dL   RDW 16.8 (H) 11.5 - 15.5 %   Platelets 193 150 - 400 K/uL  Renal function panel     Status: Abnormal   Collection Time: 08/17/14  3:20 AM  Result Value Ref Range   Sodium 136 (L) 137 - 147 mEq/L    Potassium 4.2 3.7 - 5.3 mEq/L   Chloride 94 (L) 96 - 112 mEq/L   CO2 25 19 - 32 mEq/L   Glucose, Bld 172 (H) 70 - 99 mg/dL   BUN 27 (H) 6 - 23 mg/dL   Creatinine, Ser 5.40 (H) 0.50 - 1.35 mg/dL   Calcium 8.9 8.4 - 10.5 mg/dL   Phosphorus 4.1 2.3 - 4.6 mg/dL   Albumin 1.9 (L) 3.5 - 5.2 g/dL   GFR calc non Af Amer 10 (L) >90 mL/min   GFR calc Af Amer 12 (L) >90 mL/min    Comment: (NOTE) The eGFR has been calculated using the CKD EPI equation. This calculation has not been validated in all clinical situations. eGFR's persistently <90 mL/min signify possible Chronic Kidney Disease.    Anion gap 17 (H) 5 - 15  Protime-INR     Status: Abnormal   Collection Time: 08/17/14  3:20 AM  Result Value Ref Range   Prothrombin Time 17.1 (H) 11.6 - 15.2 seconds   INR 1.38 0.00 - 1.49  Vancomycin, random     Status: None   Collection Time: 08/17/14  3:20 AM  Result Value Ref Range   Vancomycin Rm 15.2 ug/mL    Comment:        Random Vancomycin therapeutic range is dependent on dosage and time of specimen collection. A peak range is 20.0-40.0 ug/mL A trough range is 5.0-15.0 ug/mL          Glucose, capillary     Status: Abnormal   Collection Time: 08/17/14  8:11 AM  Result Value Ref Range   Glucose-Capillary 154 (H) 70 - 99 mg/dL   Comment 1 Capillary Sample   Renal function panel     Status: Abnormal   Collection Time: 08/17/14 10:22 AM  Result Value Ref Range   Sodium 136 (L) 137 - 147 mEq/L   Potassium 4.0 3.7 - 5.3 mEq/L   Chloride 95 (L) 96 - 112 mEq/L   CO2 25 19 - 32 mEq/L   Glucose, Bld 169 (H) 70 - 99 mg/dL   BUN 30 (H) 6 - 23 mg/dL   Creatinine, Ser 5.98 (H) 0.50 - 1.35 mg/dL   Calcium 8.9 8.4 - 10.5 mg/dL   Phosphorus 3.8 2.3 - 4.6 mg/dL   Albumin 2.0 (L) 3.5 - 5.2 g/dL   GFR calc non Af Amer 9 (L) >90 mL/min   GFR calc Af Amer 10 (L) >90 mL/min    Comment: (NOTE) The eGFR has been calculated using the CKD EPI equation. This calculation has not been validated in  all clinical situations. eGFR's persistently <90 mL/min signify possible Chronic Kidney Disease.    Anion gap 16 (H) 5 - 15  Glucose, capillary     Status: Abnormal   Collection Time: 08/17/14 12:10 PM  Result Value Ref Range   Glucose-Capillary 171 (H) 70 - 99 mg/dL  Glucose, capillary     Status: Abnormal   Collection Time: 08/17/14  4:56 PM  Result Value Ref Range   Glucose-Capillary 161 (H) 70 - 99 mg/dL   Comment 1 Capillary Sample   Heparin level (unfractionated)     Status: Abnormal   Collection Time: 08/17/14  7:30 PM  Result Value Ref Range   Heparin Unfractionated <0.10 (L) 0.30 - 0.70 IU/mL    Comment:        IF HEPARIN RESULTS ARE BELOW EXPECTED  VALUES, AND PATIENT DOSAGE HAS BEEN CONFIRMED, SUGGEST FOLLOW UP TESTING OF ANTITHROMBIN III LEVELS.   Glucose, capillary     Status: Abnormal   Collection Time: 08/17/14  9:16 PM  Result Value Ref Range   Glucose-Capillary 193 (H) 70 - 99 mg/dL   Comment 1 Capillary Sample   CBC     Status: Abnormal   Collection Time: 08/18/14  2:49 AM  Result Value Ref Range   WBC 10.7 (H) 4.0 - 10.5 K/uL   RBC 2.89 (L) 4.22 - 5.81 MIL/uL   Hemoglobin 8.1 (L) 13.0 - 17.0 g/dL   HCT 24.2 (L) 39.0 - 52.0 %   MCV 83.7 78.0 - 100.0 fL   MCH 28.0 26.0 - 34.0 pg   MCHC 33.5 30.0 - 36.0 g/dL   RDW 16.8 (H) 11.5 - 15.5 %   Platelets 189 150 - 400 K/uL  Heparin level (unfractionated)     Status: Abnormal   Collection Time: 08/18/14  2:49 AM  Result Value Ref Range   Heparin Unfractionated <0.10 (L) 0.30 - 0.70 IU/mL    Comment:        IF HEPARIN RESULTS ARE BELOW EXPECTED VALUES, AND PATIENT DOSAGE HAS BEEN CONFIRMED, SUGGEST FOLLOW UP TESTING OF ANTITHROMBIN III LEVELS.   Protime-INR     Status: Abnormal   Collection Time: 08/18/14  2:49 AM  Result Value Ref Range   Prothrombin Time 18.2 (H) 11.6 - 15.2 seconds   INR 1.50 (H) 0.00 - 1.49    Imaging: No results found.  Assessment:  Principal Problem:   Loculated pleural  effusion Active Problems:   Atrial fibrillation   [place] term current use of anticoagulant therapy   ESRD (end stage renal disease) on dialysis   DM type 2 causing ESRD   Septic shock   Pleural effusion, right   Atrial fibrillation with rapid ventricular response   Absolute anemia   HCAP (healthcare-associated pneumonia)   S/P thoracentesis   Hemodialysis-associated hypotension   Idiopathic hypotension   Plan:  Continue IV amiodarone. Plan for TEE/Cardioversion today. Heparin has been subtherapeutic. INR is 1.5 today, getting warfarin. If he converts, would switch to po amiodarone going forward. May be able to decrease bblocker if he establishes sinus, given hypotension with dialysis.  Time Spent Directly with Patient:  15 minutes  Length of Stay:  LOS: 5 days   Pixie Casino, MD, [facility] Attending Cardiologist [facility]  HILTY,Kenneth C 08/18/2014, 11:54 AM

## 2014-08-18 NOTE — Procedures (Signed)
Tolerating hemodialysis.  BP remains low, but this is chronic.  He is mentating and at baseline. Jannett Schmall C

## 2014-08-18 NOTE — Progress Notes (Signed)
IV team called due to pt needing an IV and being a hard stick. Pt has two IV's already in place: one placed today by IV team and one expired on 12/21. IV nurse unable to get IV after a couple of attempts; another nurse called but was also unsuccessful. Pt has both amio and heparin gtts which are incompatible with each other. Due to this we will keep the expired IV in place. IV team did change old IV dressing and we will monitor its condition closely.

## 2014-08-18 NOTE — Progress Notes (Signed)
[facility] PROGRESS NOTE   Admission history of present illness/brief narrative: This is a 63 year old male w/ complicated med hx: ESRD, type II DM, CABG/ MVR in 2008, CHF, cardiomyopathy (EF 15-20 %), atrial fibrillation, GI bleeds, SBP, pneumatosis coli, c diff colitis. Admitted 12/17 w/ working dx of secondary infection of chronic loculated right effusion. Was being treated w/ ABX IV vancomycin and cefepime since admission 12/17, seen by CT surgery, initially with supratherapeutic INR corrected w/ Vit K w/ planned thoracentesis and possible VATS.  Patient hospital stay complicated by hypotension on 12/19 after hemodialysis, felt to be septic/cardiac/hypovolemic shock, as well A. fib with RVR initially on Cardizem drip, but change to  amiodarone drip secondary to hypotension. Patient had thoracocentesis on 08/16/14 with 1 L drained, appears exudate, but gram stains are negative, no growth so far. Assessment/Plan:  Pleural effusion, right/ Loculated pleural effusion: - Patient underwent thoracocentesis 12/20 with 1 L drained. - Still be having mixed picture, as exudative by protein criteria but not by LDH criteria, cell count consistent with a parapneumonic effusion, normal pH and LDH and consistent with infected pleural space, pulmonary input appreciated, this is likely the natural progression of hemothorax. -  CT surgery consult appreciated, no indication of surgery in the current setting as fluid does not appear to be infected. - no no growth or poor fluid, Gram stain is negative, discussed with CT surgery, no plans for surgery as [place] there is no infection apparent on the cultures, so would resume back on anticoagulation. - empirically on vanc and cefepime 12/17 -12/21, will discontinue IV antibiotics giving no evidence of infected pleural fluid.  Hypotension: - Appears multifactorial cardiac, sepsis, hypovolemia. -Discussed with nephrology, patient is usually hypotensive after  hemodialysis, that's why he is on midodrine, can tolerate systolic blood pressure in the low 80s and high 70s as [place] patient is asymptomatic.  Atrial fibrillation/[place] term current use of anticoagulant therapy: - still on amiodarone drip, cardiology consult appreciated. - for TEE guided cardioversion as per cardiology Resume back on warfarin  Coagulopathy with supra therapeutic INR: -Secondary to warfarin, corrected with vitamin K.  ESRD (end stage renal disease) on dialysis: - Renal consulted - One unit packed red blood cell transfusion 12/20  DM type 2 causing ESRD - Lantus insulin and sliding scale of insulin. - cbg's ACHS.   DVT prophylaxis - started on subcutaneous heparin.   Code Status: full Family Communication: None at bedside Disposition Plan: Remains in stepdown   Consultants:  Renal  CT Surgery  PCCM  Cardiology  Procedures:  Ct abd and pelvis  Thoracocentesis with 1 L drained 12/20  One unit packed red blood cell transfusion 12/20  Antibiotics:  vanc and cefepime 12.17.2015-12/22.  HPI/Subjective: Had could not sleep, no shortness of breath, no fever no chest pain overnight.  Objective: Filed Vitals:   08/18/14 0830 08/18/14 0900 08/18/14 0930 08/18/14 1000  BP: 85/51 88/56 91/64  79/51  Pulse: 100 90 98 100  Temp:      TempSrc:      Resp: 23 24 26 24   Height:      Weight:      SpO2: 97% 96% 98% 97%    Intake/Output Summary (Last 24 hours) at 08/18/14 1018 Last data filed at 08/18/14 0600  Gross per 24 hour  Intake 1112.79 ml  Output      0 ml  Net 1112.79 ml   Filed Weights   08/17/14 0300 08/18/14 0300 08/18/14 0720  Weight: 88.6  kg (195 lb 5.2 oz) 88.9 kg (195 lb 15.8 oz) 90 kg (198 lb 6.6 oz)    Exam:  General: Alert, awake, oriented x3, in no acute distress.  HEENT: No bruits, no goiter.  Heart: Irregular irregular, no rubs, gallops. Lungs: Good air movement,  Abdomen: Soft, nontender, nondistended, positive bowel  sounds.  EXT Right AKA    Data Reviewed: Basic Metabolic Panel:  Recent Labs Lab 08/13/14 0403 08/13/14 1402 08/15/14 0245 08/17/14 0320 08/17/14 1022  NA 138 135* 130* 136* 136*  K 5.1 3.8 4.7 4.2 4.0  CL 96 93* 87* 94* 95*  CO2 21 24 23 25 25   GLUCOSE 186* 247* 242* 172* 169*  BUN 55* 24* 45* 27* 30*  CREATININE 9.42* 4.79* 7.21* 5.40* 5.98*  CALCIUM 9.0 8.6 8.4 8.9 8.9  PHOS  --   --   --  4.1 3.8   Liver Function Tests:  Recent Labs Lab 08/13/14 0403 08/15/14 0245 08/16/14 1616 08/17/14 0320 08/17/14 1022  AST 34 34  --   --   --   ALT 20 24  --   --   --   ALKPHOS 168* 151*  --   --   --   BILITOT 1.5* 2.0*  --   --   --   PROT 7.1 6.5 6.6  --   --   ALBUMIN 2.5* 2.1*  --  1.9* 2.0*   No results for input(s): LIPASE, AMYLASE in the last 168 hours. No results for input(s): AMMONIA in the last 168 hours. CBC:  Recent Labs Lab 08/13/14 0403 08/13/14 1402 08/15/14 0245 08/16/14 0430 08/17/14 0320 08/18/14 0249  WBC 20.8* 14.7* 10.4 9.0 10.7* 10.7*  NEUTROABS 18.2* 12.5*  --   --   --   --   HGB 8.5* 8.1* 7.7* 8.0* 8.5* 8.1*  HCT 26.7* 25.9* 23.9* 24.5* 25.7* 24.2*  MCV 97.4 98.5 91.6 92.5 86.8 83.7  PLT 195 198 201 194 193 189   Cardiac Enzymes:  Recent Labs Lab 08/13/14 0403 08/13/14 1402  TROPONINI <0.30 <0.30   BNP (last 3 results)  Recent Labs  08/15/14 0245  PROBNP >70000.0*   CBG:  Recent Labs Lab 08/16/14 2120 08/17/14 0811 08/17/14 1210 08/17/14 1656 08/17/14 2116  GLUCAP 202* 154* 171* 161* 193*    Recent Results (from the past 240 hour(s))  Blood culture (routine x 2)     Status: None (Preliminary result)   Collection Time: 08/13/14  5:45 PM  Result Value Ref Range Status   Specimen Description BLOOD LEFT HAND  Final   Special Requests BOTTLES DRAWN AEROBIC ONLY 4CC  Final   Culture NO GROWTH 4 DAYS  Final   Report Status PENDING  Incomplete  Blood culture (routine x 2)     Status: None (Preliminary result)    Collection Time: 08/13/14  5:55 PM  Result Value Ref Range Status   Specimen Description BLOOD LEFT ARM  Final   Special Requests   Final    BOTTLES DRAWN AEROBIC AND ANAEROBIC AEB=8CC ANA=6CC   Culture NO GROWTH 4 DAYS  Final   Report Status PENDING  Incomplete  MRSA PCR Screening     Status: Abnormal   Collection Time: 08/14/14  2:05 AM  Result Value Ref Range Status   MRSA by PCR POSITIVE (A) NEGATIVE Final    Comment:        The GeneXpert MRSA Assay (FDA approved for NASAL specimens only), is one component of a comprehensive MRSA  colonization surveillance program. It is not intended to diagnose MRSA infection nor to guide or monitor treatment for MRSA infections. RESULT CALLED TO, READ BACK BY AND VERIFIED WITH: D RICE,RN [phone] [facility]   Body fluid culture     Status: None (Preliminary result)   Collection Time: 08/16/14 10:33 AM  Result Value Ref Range Status   Specimen Description PLEURAL RIGHT FLUID  Final   Special Requests NONE  Final   Gram Stain   Final    FEW WBC PRESENT, PREDOMINANTLY PMN NO SQUAMOUS EPITHELIAL CELLS SEEN NO ORGANISMS SEEN Performed at [facility]    Culture   Final    NO GROWTH 1 DAY Performed at [facility]    Report Status PENDING  Incomplete     Studies: Dg Chest 1 View  08/16/2014   CLINICAL DATA:  Status post thoracentesis  EXAM: CHEST - 1 VIEW  COMPARISON:  August 13, 2014  FINDINGS: There is no appreciable pneumothorax. There is less effusion on the right compared to recent prior study. There is asymmetric edema in the right lung with patchy consolidation in the right mid and lower lung zones. On the left, there is atelectatic change in the lateral base region. There is a slight degree of interstitial edema on the left. Heart is enlarged. Patient is status post mitral valve replacement as well as coronary artery bypass grafting. There is a degree of pulmonary venous hypertension. No adenopathy.  IMPRESSION:  Less effusion on the right compared to recent prior study. There is right lower lobe consolidation. There is a degree of underlying congestive heart failure. There is somewhat more edema on the right than on the left. No pneumothorax.   Electronically Signed   By: Lowella Grip M.D.   On: 08/16/2014 11:01   US Thoracentesis Asp Pleural Space W/img Guide  08/16/2014   INDICATION: Patient with history of end-stage renal disease, CHF, chronic right effusion. Request is made for diagnostic and therapeutic right thoracentesis.  EXAM: ULTRASOUND GUIDED DIAGNOSTIC AND THERAPEUTIC RIGHT THORACENTESIS  COMPARISON:  None.  MEDICATIONS: None  COMPLICATIONS: None immediate  TECHNIQUE: Informed written consent was obtained from the patient after a discussion of the risks, benefits and alternatives to treatment. A timeout was performed prior to the initiation of the procedure.  Initial ultrasound scanning demonstrates a moderate-to-large right pleural effusion. The lower chest was prepped and draped in the usual sterile fashion. 1% lidocaine was used for local anesthesia.  Under direct ultrasound guidance, a 19 gauge, 7-cm, Yueh catheter was introduced. An ultrasound image was saved for documentation purposes. The thoracentesis was performed. The catheter was removed and a dressing was applied. The patient tolerated the procedure well without immediate post procedural complication. The patient was escorted to have an upright chest radiograph.  FINDINGS: A total of approximately 1 liter of thick, dark bloody fluid was removed. Only the above amount of fluid could be aspirated at this time. Requested samples were sent to the laboratory.  IMPRESSION: Successful ultrasound-guided diagnostic and therapeutic right sided thoracentesis yielding 1 liter of pleural fluid.  Read by: Rowe Robert, PA-C   Electronically Signed   By: Markus Daft M.D.   On: 08/16/2014 10:31    Scheduled Meds: . allopurinol  100 mg Oral Daily  .  atorvastatin  20 mg Oral QPM  . ceFEPime (MAXIPIME) IV  1 g Intravenous Q T,Th,Sa-HD  . Chlorhexidine Gluconate Cloth  6 each Topical Q0600  . cinacalcet  30 mg Oral Q  breakfast  . folic acid  1 mg Oral Daily  . guaiFENesin  600 mg Oral BID  . insulin aspart  0-5 Units Subcutaneous QHS  . insulin aspart  0-9 Units Subcutaneous TID WC  . insulin glargine  10 Units Subcutaneous QHS  . metoprolol  75 mg Oral BID  . midodrine  10 mg Oral BID WC  . mupirocin ointment  1 application Nasal BID  . pantoprazole  40 mg Oral BID  . phytonadione (VITAMIN K) IV  5 mg Intravenous Once  . sevelamer carbonate  800 mg Oral TID WC  . sodium chloride  3 mL Intravenous Q12H  . traZODone  50 mg Oral QHS  . vancomycin  1,000 mg Intravenous Q T,Th,Sa-HD  . Warfarin - Pharmacist Dosing Inpatient   Does not apply q1800   Continuous Infusions: . sodium chloride 20 mL/hr at 08/17/14 2329  . amiodarone 30 mg/hr (08/18/14 0428)  . heparin 2,000 Units/hr (08/18/14 0432)   Time spent: 35 minutes  [facility], Mihran Lebarron  [facility] Pager [phone]. If 8PM-8AM, please contact night-coverage at [email or web address], password [facility] 08/18/2014, 10:18 AM  LOS: 5 days

## 2014-08-18 NOTE — Progress Notes (Signed)
[place] aware of MRSA from fluid culture sent yesterday

## 2014-08-18 NOTE — Progress Notes (Signed)
Not accurate

## 2014-08-18 NOTE — Progress Notes (Signed)
PULMONARY / CRITICAL CARE MEDICINE   Name: [name] [name] MRN: [id number] DOB: [birth year]    ADMISSION DATE:  08/13/2014 CONSULTATION DATE:  12/19  REFERRING MD :  Elgergawy   CHIEF COMPLAINT:  Shock and AF w/ RVR   INITIAL PRESENTATION:  This is a 63 year old male w/ complicated med hx: ESRD, type II DM, CABG/ MVR in 2008, CHF, cardiomyopathy (EF 15-20 %), atrial fibrillation, GI bleeds, SBP, pneumatosis coli,  c diff colitis. Admitted 12/17 w/ working dx of secondary infection of chronic loculated right effusion. Was being treated w/ ABX, correcting INR w/ Vit K w/ planned thoracentesis and possible VATS. On 12/19 PCCM was consulted for hypotension when he returned from HD w/ SBP in 70s    STUDIES:  CT abd 12/17: Large loculated right pleural effusion. This may represent contracting hemothorax based on the prior CT.Liver and spleen normal. Small calcified gallstones. Gallbladder is contracted and not thickened. Bile ducts nondilated. Pancreas and spleen are normal.Small kidneys due to chronic renal failure. No renal mass or hydronephrosis.Negative for bowel obstruction. No bowel thickening. Negative for diverticulitis. Normal appendix. Resolution of thickening of the cecum compared with the prior study which may be resolution of colitis  SIGNIFICANT EVENTS: 12/17: admitted for sepsis felt to be d/t secondary infxn of right chronic effusion. ABX started 12/18 seen by thoracic. Recommended thoracentesis but INR was supra therapeutic  12/19 hypotensive after HD 12/20 thora done by IR.  SUBJECTIVE:  No events overnight, up in a chair eating breakfast.  VITAL SIGNS: Temp:  [98.2 F (36.8 C)-99.8 F (37.7 C)] 98.2 F (36.8 C) (12/22 1052) Pulse Rate:  [43-110] 110 (12/22 1052) Resp:  [14-30] 24 (12/22 1052) BP: (79-114)/(38-87) 83/52 mmHg (12/22 1052) SpO2:  [93 %-99 %] 97 % (12/22 1052) Weight:  [88.8 kg (195 lb 12.3 oz)-90 kg (198 lb 6.6 oz)] 88.8 kg (195 lb 12.3 oz) (12/22  1052) HEMODYNAMICS:   VENTILATOR SETTINGS:   INTAKE / OUTPUT:  Intake/Output Summary (Last 24 hours) at 08/18/14 1130 Last data filed at 08/18/14 1052  Gross per 24 hour  Intake 1096.09 ml  Output   1500 ml  Net -403.91 ml    PHYSICAL EXAMINATION: General:  63 year old male, in no acute distress.  Neuro:  Awake, alert, no focal def  HEENT:  [place], no JVd  Cardiovascular:  Tachy irreg  Lungs:  CTA bilaterally, less BS on the right Abdomen:  Non-tender + bowel sounds  Musculoskeletal:  Intact, + prior right AKA  Skin:  Intact   LABS:  CBC  Recent Labs Lab 08/16/14 0430 08/17/14 0320 08/18/14 0249  WBC 9.0 10.7* 10.7*  HGB 8.0* 8.5* 8.1*  HCT 24.5* 25.7* 24.2*  PLT 194 193 189   Coag's  Recent Labs Lab 08/15/14 1510 08/17/14 0320 08/18/14 0249  INR 1.80* 1.38 1.50*   BMET  Recent Labs Lab 08/15/14 0245 08/17/14 0320 08/17/14 1022  NA 130* 136* 136*  K 4.7 4.2 4.0  CL 87* 94* 95*  CO2 23 25 25   BUN 45* 27* 30*  CREATININE 7.21* 5.40* 5.98*  GLUCOSE 242* 172* 169*   Electrolytes  Recent Labs Lab 08/15/14 0245 08/17/14 0320 08/17/14 1022  CALCIUM 8.4 8.9 8.9  PHOS  --  4.1 3.8   Sepsis Markers  Recent Labs Lab 08/15/14 1907  LATICACIDVEN 1.6   ABG No results for input(s): PHART, PCO2ART, PO2ART in the last 168 hours. Liver Enzymes  Recent Labs Lab 08/13/14 0403 08/15/14 0245 08/17/14  0320 08/17/14 1022  AST 34 34  --   --   ALT 20 24  --   --   ALKPHOS 168* 151*  --   --   BILITOT 1.5* 2.0*  --   --   ALBUMIN 2.5* 2.1* 1.9* 2.0*   Cardiac Enzymes  Recent Labs Lab 08/13/14 0403 08/13/14 1402 08/15/14 0245  TROPONINI <0.30 <0.30  --   PROBNP  --   --  >70000.0*   Glucose  Recent Labs Lab 08/16/14 1721 08/16/14 2120 08/17/14 0811 08/17/14 1210 08/17/14 1656 08/17/14 2116  GLUCAP 176* 202* 154* 171* 161* 193*    Imaging No results found.   ASSESSMENT / PLAN:  PULMONARY OETT  A: Loculated right pleural  effusion. Old hemothorax. Seen by thoracic surgery, likely now w/ secondary infection of pleural space  Thora done, results noted, exudative by protein but not LDH criteria, cell counts are consistent with a para-pneumonic effusion but pH is normal and LDH is normal which is quite inconsistent with infected pleural space.  WBC normalized essentially and there is no fever even without anti-biotics, this would all be inconsistent with an infection in the pleural space.  This is likely the natural progression of a hemothorax. P:   Wean FIo2 Pulm hygiene F/U on pleural fluid cultures. Thoracic surg following and will defer further needs for interventions to CVTS.  CARDIOVASCULAR CVL none A:  Hypotension/shock. Resolved now, suspect related to chronic process, sepsis unlikely at this point.  AF w/ RVR Known ICM w/ EF 15-20% P:  Cortisol level 34.3, no need for stress dose steroids Continue midodrine Cont tele Patient still on lopressor 75 mg BID, would recommend holding that or at least decreasing it given hypotension.  RENAL A:  ESRD P:   HD per renal   GASTROINTESTINAL A:  No acute  P:   Diet as tolerated   HEMATOLOGIC A:   Anemia of chronic disease Coumadin induced coagulopathy: s/p vit K  P:  Trend INR  Trend CBC May consider IV heparin now that INR is normal and restart of coumadin if CVTS will not intervene.  INFECTIOUS A:  Septic shock in setting of probable infected loculated effusion from old hemothorax   P:   BCx2 12/17>>> Cefepime 12/17>>>12/19 vanc 12/17>>>12/19 Monitor of abx for now.  ENDOCRINE A:  DM w/ hyperglycemia  P:   SSI protocol   NEUROLOGIC A:  No acute  P:   RASS goal: na Supportive care   TODAY'S SUMMARY:  Very unlikely that chest space is infected, CVTS to evaluate, hold off abx, hypotension is a chronic process for patient, specially with how well mental status is doing.   Recommend decreasing or stopping lopressor while hypotensive.   PCCM will sign off, please call back if needed.  Rush Farmer, M.D. [facility] Pulmonary/Critical Care Medicine. Pager: [phone]. After hours pager: [phone].  08/18/2014, 11:30 AM

## 2014-08-19 ENCOUNTER — Inpatient Hospital Stay (HOSPITAL_COMMUNITY): Payer: Medicare Other | Admitting: Anesthesiology

## 2014-08-19 ENCOUNTER — Encounter (HOSPITAL_COMMUNITY)
Admission: EM | Disposition: A | Discharge status: Home / Self Care | Payer: Medicare Other | Source: Home / Self Care | Attending: Internal Medicine

## 2014-08-19 ENCOUNTER — Encounter (HOSPITAL_COMMUNITY): Payer: Self-pay

## 2014-08-19 DIAGNOSIS — I4891 Unspecified atrial fibrillation: Secondary | ICD-10-CM

## 2014-08-19 DIAGNOSIS — I340 Nonrheumatic mitral (valve) insufficiency: Secondary | ICD-10-CM

## 2014-08-19 DIAGNOSIS — I950 Idiopathic hypotension: Secondary | ICD-10-CM

## 2014-08-19 HISTORY — PX: CARDIOVERSION: SHX1299

## 2014-08-19 HISTORY — PX: TEE WITHOUT CARDIOVERSION: SHX5443

## 2014-08-19 LAB — BODY FLUID CULTURE

## 2014-08-19 LAB — TYPE AND SCREEN
ABO/RH(D): O POS
Antibody Screen: NEGATIVE
UNIT DIVISION: 0
Unit division: 0

## 2014-08-19 LAB — PROTIME-INR
INR: 2.36 — ABNORMAL HIGH (ref 0.00–1.49)
Prothrombin Time: 26 seconds — ABNORMAL HIGH (ref 11.6–15.2)

## 2014-08-19 LAB — BASIC METABOLIC PANEL
ANION GAP: 13 (ref 5–15)
BUN: 24 mg/dL — ABNORMAL HIGH (ref 6–23)
CALCIUM: 8.4 mg/dL (ref 8.4–10.5)
CHLORIDE: 99 meq/L (ref 96–112)
CO2: 27 mmol/L (ref 19–32)
Creatinine, Ser: 5.56 mg/dL — ABNORMAL HIGH (ref 0.50–1.35)
GFR calc Af Amer: 11 mL/min — ABNORMAL LOW (ref >90)
GFR calc non Af Amer: 10 mL/min — ABNORMAL LOW (ref >90)
Glucose, Bld: 156 mg/dL — ABNORMAL HIGH (ref 70–99)
Potassium: 3.4 mmol/L — ABNORMAL LOW (ref 3.5–5.1)
SODIUM: 139 mmol/L (ref 135–145)

## 2014-08-19 LAB — HIV ANTIBODY (ROUTINE TESTING W REFLEX): HIV: NONREACTIVE

## 2014-08-19 LAB — GLUCOSE, CAPILLARY
Glucose-Capillary: 139 mg/dL — ABNORMAL HIGH (ref 70–99)
Glucose-Capillary: 108 mg/dL — ABNORMAL HIGH (ref 70–99)
Glucose-Capillary: 134 mg/dL — ABNORMAL HIGH (ref 70–99)
Glucose-Capillary: 115 mg/dL — ABNORMAL HIGH (ref 70–99)

## 2014-08-19 LAB — CBC
HEMATOCRIT: 27.4 % — AB (ref 39.0–52.0)
HEMOGLOBIN: 9.3 g/dL — AB (ref 13.0–17.0)
MCH: 27.9 pg (ref 26.0–34.0)
MCHC: 33.9 g/dL (ref 30.0–36.0)
MCV: 82.3 fL (ref 78.0–100.0)
Platelets: 228 10*3/uL (ref 150–400)
RBC: 3.33 MIL/uL — ABNORMAL LOW (ref 4.22–5.81)
RDW: 17.1 % — ABNORMAL HIGH (ref 11.5–15.5)
WBC: 10.5 10*3/uL (ref 4.0–10.5)

## 2014-08-19 LAB — HEPARIN LEVEL (UNFRACTIONATED)
Heparin Unfractionated: 0.19 IU/mL — ABNORMAL LOW (ref 0.30–0.70)
Heparin Unfractionated: 0.2 IU/mL — ABNORMAL LOW (ref 0.30–0.70)

## 2014-08-19 LAB — HEPATITIS C ANTIBODY: HCV Ab: NEGATIVE

## 2014-08-19 SURGERY — ECHOCARDIOGRAM, TRANSESOPHAGEAL
Anesthesia: Monitor Anesthesia Care

## 2014-08-19 MED ORDER — ALTEPLASE 2 MG IJ SOLR
2.0000 mg | Freq: Once | INTRAMUSCULAR | Status: AC | PRN
  Filled 2014-08-19: qty 2

## 2014-08-19 MED ORDER — AMIODARONE HCL 200 MG PO TABS
400.0000 mg | ORAL_TABLET | Freq: Every day | ORAL | Status: DC
  Administered 2014-08-19 – 2014-08-20 (×2): 400 mg via ORAL
  Filled 2014-08-19 (×2): qty 2

## 2014-08-19 MED ORDER — WARFARIN SODIUM 1 MG PO TABS
1.0000 mg | ORAL_TABLET | Freq: Once | ORAL | Status: AC
  Administered 2014-08-19: 1 mg via ORAL
  Filled 2014-08-19: qty 1

## 2014-08-19 MED ORDER — LIDOCAINE HCL (PF) 1 % IJ SOLN: 5.0000 mL | INTRAMUSCULAR | Status: DC | PRN

## 2014-08-19 MED ORDER — SODIUM CHLORIDE 0.9 % IV SOLN
INTRAVENOUS | Status: DC | PRN
  Administered 2014-08-19: 09:00:00 via INTRAVENOUS

## 2014-08-19 MED ORDER — HEPARIN SODIUM (PORCINE) 1000 UNIT/ML DIALYSIS
1000.0000 [IU] | INTRAMUSCULAR | Status: DC | PRN
  Filled 2014-08-19: qty 1

## 2014-08-19 MED ORDER — SODIUM CHLORIDE 0.9 % IV SOLN: 100.0000 mL | INTRAVENOUS | Status: DC | PRN

## 2014-08-19 MED ORDER — NEPRO/CARBSTEADY PO LIQD
237.0000 mL | ORAL | Status: DC | PRN
  Filled 2014-08-19: qty 237

## 2014-08-19 MED ORDER — HEPARIN SODIUM (PORCINE) 1000 UNIT/ML DIALYSIS
20.0000 [IU]/kg | INTRAMUSCULAR | Status: DC | PRN
  Filled 2014-08-19: qty 2

## 2014-08-19 MED ORDER — SODIUM CHLORIDE 0.9 % IV SOLN: INTRAVENOUS | Status: DC

## 2014-08-19 MED ORDER — PROPOFOL INFUSION 10 MG/ML OPTIME
INTRAVENOUS | Status: DC | PRN
  Administered 2014-08-19: 25 ug/kg/min via INTRAVENOUS

## 2014-08-19 MED ORDER — LIDOCAINE-PRILOCAINE 2.5-2.5 % EX CREA
1.0000 "application " | TOPICAL_CREAM | CUTANEOUS | Status: DC | PRN
  Filled 2014-08-19: qty 5

## 2014-08-19 MED ORDER — PENTAFLUOROPROP-TETRAFLUOROETH EX AERO: 1.0000 "application " | INHALATION_SPRAY | CUTANEOUS | Status: DC | PRN

## 2014-08-19 NOTE — Anesthesia Procedure Notes (Signed)
Procedure Name: MAC Date/Time: 08/19/2014 9:25 AM Performed by: Kyung Rudd Pre-anesthesia Checklist: Patient identified, Emergency Drugs available, Suction available, Patient being monitored and Timeout performed Patient Re-evaluated:Patient Re-evaluated prior to inductionOxygen Delivery Method: Nasal cannula Intubation Type: IV induction Placement Confirmation: positive ETCO2

## 2014-08-19 NOTE — Progress Notes (Signed)
Assessment: 1. Hemothorax, right 2. ESRD - HD TTS Plan HD for AM 3. Chronic hypotension 4. 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 Hx A-fib - s/p DCCV today  6. DM Type 2 - insulin per primary.  Subjective: Interval History: s/p DCCV today  Objective: Vital signs in last 24 hours: Temp:  [97.7 F (36.5 C)-99.8 F (37.7 C)] 97.9 F (36.6 C) (12/23 1200) Pulse Rate:  [30-151] 65 (12/23 1200) Resp:  [15-30] 26 (12/23 1200) BP: (74-119)/(34-65) 86/47 mmHg (12/23 1200) SpO2:  [64 %-100 %] 86 % (12/23 1200) Weight:  [88.4 kg (194 lb 14.2 oz)] 88.4 kg (194 lb 14.2 oz) (12/23 0347) Weight change: 1.1 kg (2 lb 6.8 oz)  Intake/Output from previous day: 12/22 0701 - 12/23 0700 In: 1657 [P.O.:240; I.V.:1082; Blood:335] Out: 1500  Intake/Output this shift: Total I/O In: 383.5 [I.V.:383.5] Out: -   General appearance: alert and cooperative Cardio: regular rate and rhythm, S1, S2 normal, no murmur, click, rub or gallop Extremities: bilat amputee  Lab Results:  Recent Labs  08/18/14 0249 08/19/14 0334  WBC 10.7* 10.5  HGB 8.1* 9.3*  HCT 24.2* 27.4*  PLT 189 228   BMET:  Recent Labs  08/17/14 1022 08/19/14 0334  NA 136* 139  K 4.0 3.4*  CL 95* 99  CO2 25 27  GLUCOSE 169* 156*  BUN 30* 24*  CREATININE 5.98* 5.56*  CALCIUM 8.9 8.4   No results for input(s): PTH in the last 72 hours. Iron Studies: No results for input(s): IRON, TIBC, TRANSFERRIN, FERRITIN in the last 72 hours. Studies/Results: No results found.  Scheduled: . allopurinol  100 mg Oral Daily  . amiodarone  400 mg Oral Daily  . atorvastatin  20 mg Oral QPM  . Chlorhexidine Gluconate Cloth  6 each Topical Q0600  . cinacalcet  30 mg Oral Q breakfast  . folic acid  1 mg Oral Daily  . guaiFENesin  600 mg Oral BID  . insulin aspart  0-5 Units Subcutaneous QHS  . insulin aspart  0-9 Units Subcutaneous TID WC  . insulin glargine  10 Units Subcutaneous QHS  . metoprolol  75 mg Oral BID  . midodrine  10  mg Oral BID WC  . mupirocin ointment  1 application Nasal BID  . pantoprazole  40 mg Oral BID  . phytonadione (VITAMIN K) IV  5 mg Intravenous Once  . sevelamer carbonate  800 mg Oral TID WC  . sodium chloride  3 mL Intravenous Q12H  . traZODone  50 mg Oral QHS  . [START ON 08/20/2014] vancomycin  1,000 mg Intravenous Q T,Th,Sa-HD  . Warfarin - Pharmacist Dosing Inpatient   Does not apply q1800      LOS: 6 days   Mikita Lesmeister C 08/19/2014,2:34 PM

## 2014-08-19 NOTE — Progress Notes (Signed)
TEE without thrombus, successful DCCV. Maintaining sinus rhythm. Will d/c IV amiodarone and switch to po amiodarone 400 mg daily. Recommend follow-up after discharge with Dr. Domenic Polite in [place].  Cardiology will sign-off, call with questions.  Pixie Casino, MD, [facility] Attending Cardiologist [place]

## 2014-08-19 NOTE — Anesthesia Postprocedure Evaluation (Signed)
  Anesthesia Post-op Note  Patient: [name] [name]  Procedure(s) Performed: Procedure(s): CANCELLED PROCEDURE  Patient Location: PACU  Anesthesia Type:MAC  Level of Consciousness: awake and alert   Airway and Oxygen Therapy: Patient Spontanous Breathing and Patient connected to nasal cannula oxygen  Post-op Pain: none  Post-op Assessment: Post-op Vital signs reviewed  Post-op Vital Signs: Reviewed and stable  Last Vitals:  Filed Vitals:   08/19/14 0824  BP: 100/52  Pulse:   Temp: 36.8 C  Resp: 16    Complications: No apparent anesthesia complications

## 2014-08-19 NOTE — Progress Notes (Signed)
[facility] PROGRESS NOTE   Admission history of present illness/brief narrative: This is a 63 year old male w/ complicated med hx: ESRD, type II DM, CABG/ MVR in 2008, CHF, cardiomyopathy (EF 15-20 %), atrial fibrillation, GI bleeds, SBP, pneumatosis coli, c diff colitis. Admitted 12/17 w/ working dx of secondary infection of chronic loculated right effusion. Was being treated w/ ABX IV vancomycin and cefepime since admission 12/17, seen by CT surgery, initially with supratherapeutic INR corrected w/ Vit K w/ planned thoracentesis and possible VATS.  Patient hospital stay complicated by hypotension on 12/19 after hemodialysis, felt to be septic/cardiac/hypovolemic shock, as well A. fib with RVR initially on Cardizem drip, but change to  amiodarone drip secondary to hypotension. Patient had thoracocentesis on 08/16/14 with 1 L drained, appears exudate, pleural effusion culture growing MRSA , a shunt was started on IV vancomycin, to finish total of 2 weeks as per ID recommendation . -Patient had DCCV done 12/23 , and at atrial fibrillation converted to normal sinus rhythm .  Assessment/Plan:  Pleural effusion, right/ Loculated pleural effusion: - Patient underwent thoracocentesis 12/20 with 1 L drained. - Still be having mixed picture, as exudative by protein criteria but not by LDH criteria, cell count consistent with a parapneumonic effusion, normal pH and LDH and consistent with infected pleural space, pulmonary input appreciated, this is likely the natural progression of hemothorax. -  CT surgery consult appreciated, no indication of surgery in the current setting as fluid does not appear to be infected. - no no growth or poor fluid, Gram stain is negative, discussed with CT surgery, no plans for surgery as [place] there is no infection apparent on the cultures, so would resume back on anticoagulation. - empirically on vanc and cefepime 12/17 -12/21pleural effusion culture growing MRSA ,  so patient resumed on IV vancomycin to finish total of 2 weeks .  Hypotension: - Appears multifactorial cardiac, sepsis, hypovolemia. -Discussed with nephrology, patient is usually hypotensive after hemodialysis, that's why he is on midodrine, can tolerate systolic blood pressure in the low 80s and high 70s as [place] patient is asymptomatic.  Atrial fibrillation/[place] term current use of anticoagulant therapy:Initially - Lollie Marrow on amiodarone drip, cardiology consult appreciated. - for TEE guided cardioversion 12/23, TEE without thrombosis, successful DCCV, converted to normal sinus rhythm  -Secondary to warfarin, corrected with vitamin K.  ESRD (end stage renal disease) on dialysis: - Renal consulted - One unit packed red blood cell transfusion 12/20, 12/22  DM type 2 causing ESRD - Lantus insulin and sliding scale of insulin. - cbg's ACHS.   DVT prophylaxis - started on subcutaneous heparin.   Code Status: full Family Communication: wife at bedside Disposition : transfer to telemetry   Consultants:  Renal  CT Surgery  PCCM  Cardiology  Procedures:  Ct abd and pelvis  Thoracocentesis with 1 L drained 12/20  One unit packed red blood cell transfusion 12/20,12/22  TEE with cardioversion 12/23      Antibiotics   vanc and cefepime 12.17.2015-12/22.  HPI/Subjective: Had could not sleep, no shortness of breath, no fever no chest pain overnight.  Objective: Filed Vitals:   08/19/14 1044 08/19/14 1110 08/19/14 1130 08/19/14 1200  BP:  94/65 87/44 86/47   Pulse: 57  30 65  Temp:    97.9 F (36.6 C)  TempSrc:    Oral  Resp: 16 24 22 26   Height:      Weight:      SpO2:   64% 86%  Intake/Output Summary (Last 24 hours) at 08/19/14 1426 Last data filed at 08/19/14 1100  Gross per 24 hour  Intake 2040.48 ml  Output      0 ml  Net 2040.48 ml   Filed Weights   08/18/14 0720 08/18/14 1052 08/19/14 0347  Weight: 90 kg (198 lb 6.6 oz) 88.8 kg (195 lb 12.3  oz) 88.4 kg (194 lb 14.2 oz)    Exam:  General: Alert, awake, oriented x3, in no acute distress.  HEENT: No bruits, no goiter.  Heart: Irregular irregular, no rubs, gallops. Lungs: Good air movement,  Abdomen: Soft, nontender, nondistended, positive bowel sounds.  EXT Right AKA    Data Reviewed: Basic Metabolic Panel:  Recent Labs Lab 08/13/14 1402 08/15/14 0245 08/17/14 0320 08/17/14 1022 08/19/14 0334  NA 135* 130* 136* 136* 139  K 3.8 4.7 4.2 4.0 3.4*  CL 93* 87* 94* 95* 99  CO2 24 23 25 25 27   GLUCOSE 247* 242* 172* 169* 156*  BUN 24* 45* 27* 30* 24*  CREATININE 4.79* 7.21* 5.40* 5.98* 5.56*  CALCIUM 8.6 8.4 8.9 8.9 8.4  PHOS  --   --  4.1 3.8  --    Liver Function Tests:  Recent Labs Lab 08/13/14 0403 08/15/14 0245 08/16/14 1616 08/17/14 0320 08/17/14 1022  AST 34 34  --   --   --   ALT 20 24  --   --   --   ALKPHOS 168* 151*  --   --   --   BILITOT 1.5* 2.0*  --   --   --   PROT 7.1 6.5 6.6  --   --   ALBUMIN 2.5* 2.1*  --  1.9* 2.0*   No results for input(s): LIPASE, AMYLASE in the last 168 hours. No results for input(s): AMMONIA in the last 168 hours. CBC:  Recent Labs Lab 08/13/14 0403 08/13/14 1402 08/15/14 0245 08/16/14 0430 08/17/14 0320 08/18/14 0249 08/19/14 0334  WBC 20.8* 14.7* 10.4 9.0 10.7* 10.7* 10.5  NEUTROABS 18.2* 12.5*  --   --   --   --   --   HGB 8.5* 8.1* 7.7* 8.0* 8.5* 8.1* 9.3*  HCT 26.7* 25.9* 23.9* 24.5* 25.7* 24.2* 27.4*  MCV 97.4 98.5 91.6 92.5 86.8 83.7 82.3  PLT 195 198 201 194 193 189 228   Cardiac Enzymes:  Recent Labs Lab 08/13/14 0403 08/13/14 1402  TROPONINI <0.30 <0.30   BNP (last 3 results)  Recent Labs  08/15/14 0245  PROBNP >70000.0*   CBG:  Recent Labs Lab 08/18/14 1215 08/18/14 1645 08/18/14 2114 08/19/14 0742 08/19/14 1217  GLUCAP 158* 165* 150* 139* 108*    Recent Results (from the past 240 hour(s))  Blood culture (routine x 2)     Status: None   Collection Time: 08/13/14   5:45 PM  Result Value Ref Range Status   Specimen Description BLOOD LEFT HAND  Final   Special Requests BOTTLES DRAWN AEROBIC ONLY 4CC  Final   Culture NO GROWTH 5 DAYS  Final   Report Status 08/18/2014 FINAL  Final  Blood culture (routine x 2)     Status: None   Collection Time: 08/13/14  5:55 PM  Result Value Ref Range Status   Specimen Description BLOOD LEFT ARM  Final   Special Requests   Final    BOTTLES DRAWN AEROBIC AND ANAEROBIC AEB=8CC ANA=6CC   Culture NO GROWTH 5 DAYS  Final   Report Status 08/18/2014 FINAL  Final  MRSA  PCR Screening     Status: Abnormal   Collection Time: 08/14/14  2:05 AM  Result Value Ref Range Status   MRSA by PCR POSITIVE (A) NEGATIVE Final    Comment:        The GeneXpert MRSA Assay (FDA approved for NASAL specimens only), is one component of a comprehensive MRSA colonization surveillance program. It is not intended to diagnose MRSA infection nor to guide or monitor treatment for MRSA infections. RESULT CALLED TO, READ BACK BY AND VERIFIED WITH: D RICE,RN [id number] [facility]   Body fluid culture     Status: None   Collection Time: 08/16/14 10:33 AM  Result Value Ref Range Status   Specimen Description PLEURAL RIGHT FLUID  Final   Special Requests NONE  Final   Gram Stain   Final    FEW WBC PRESENT, PREDOMINANTLY PMN NO SQUAMOUS EPITHELIAL CELLS SEEN NO ORGANISMS SEEN Performed at [facility]    Culture   Final    FEW METHICILLIN RESISTANT STAPHYLOCOCCUS AUREUS Note: RIFAMPIN AND GENTAMICIN SHOULD NOT BE USED AS SINGLE DRUGS FOR TREATMENT OF STAPH INFECTIONS. CRITICAL RESULT CALLED TO, READ BACK BY AND VERIFIED WITH: AMANDA RAWSON 08/18/14 1245 BY SMITHERSJ This organism is presumed to be Clindamycin resistant  based on detection of inducible Clindamycin resistance. Performed at [facility]    Report Status 08/19/2014 FINAL  Final   Organism ID, Bacteria METHICILLIN RESISTANT STAPHYLOCOCCUS AUREUS  Final       Susceptibility   Methicillin resistant staphylococcus aureus - MIC*    CLINDAMYCIN RESISTANT      ERYTHROMYCIN >=8 RESISTANT Resistant     GENTAMICIN <=0.5 SENSITIVE Sensitive     LEVOFLOXACIN >=8 RESISTANT Resistant     OXACILLIN >=4 RESISTANT Resistant     PENICILLIN >=0.5 RESISTANT Resistant     RIFAMPIN <=0.5 SENSITIVE Sensitive     TRIMETH/SULFA <=10 SENSITIVE Sensitive     VANCOMYCIN 1 SENSITIVE Sensitive     TETRACYCLINE <=1 SENSITIVE Sensitive     * FEW METHICILLIN RESISTANT STAPHYLOCOCCUS AUREUS     Studies: No results found.  Scheduled Meds: . allopurinol  100 mg Oral Daily  . amiodarone  400 mg Oral Daily  . atorvastatin  20 mg Oral QPM  . Chlorhexidine Gluconate Cloth  6 each Topical Q0600  . cinacalcet  30 mg Oral Q breakfast  . folic acid  1 mg Oral Daily  . guaiFENesin  600 mg Oral BID  . insulin aspart  0-5 Units Subcutaneous QHS  . insulin aspart  0-9 Units Subcutaneous TID WC  . insulin glargine  10 Units Subcutaneous QHS  . metoprolol  75 mg Oral BID  . midodrine  10 mg Oral BID WC  . mupirocin ointment  1 application Nasal BID  . pantoprazole  40 mg Oral BID  . phytonadione (VITAMIN K) IV  5 mg Intravenous Once  . sevelamer carbonate  800 mg Oral TID WC  . sodium chloride  3 mL Intravenous Q12H  . traZODone  50 mg Oral QHS  . [START ON 08/20/2014] vancomycin  1,000 mg Intravenous Q T,Th,Sa-HD  . Warfarin - Pharmacist Dosing Inpatient   Does not apply q1800   Continuous Infusions: . sodium chloride 20 mL/hr at 08/19/14 1100  . sodium chloride     Time spent: 35 minutes  ELGERGAWY, DAWOOD  [facility] Pager [phone]. If 8PM-8AM, please contact night-coverage at [email or web address], password [facility] 08/19/2014, 2:26 PM  LOS: 6 days

## 2014-08-19 NOTE — Anesthesia Preprocedure Evaluation (Addendum)
Anesthesia Evaluation  Patient identified by MRN, date of birth, ID band Patient awake    Reviewed: Allergy & Precautions, H&P , NPO status , Patient's Chart, lab work & pertinent test results  Airway Mallampati: II       Dental  (+) Partial Upper, Partial Lower, Dental Advisory Given   Pulmonary neg pulmonary ROS, former smoker,  breath sounds clear to auscultation        Cardiovascular hypertension, Pt. on home beta blockers negative cardio ROS  Rhythm:Irregular  EF 20%   Neuro/Psych negative neurological ROS  negative psych ROS   GI/Hepatic negative GI ROS, Neg liver ROS, GERD-  Medicated,  Endo/Other  negative endocrine ROSdiabetes, Poorly Controlled  Renal/GU Renal InsufficiencyRenal diseasenegative Renal ROSGFR 10  negative genitourinary   Musculoskeletal negative musculoskeletal ROS (+)   Abdominal (+) + obese,   Peds negative pediatric ROS (+)  Hematology negative hematology ROS (+) anemia ,   Anesthesia Other Findings   Reproductive/Obstetrics negative OB ROS                       Anesthesia Physical Anesthesia Plan  ASA: III  Anesthesia Plan: MAC   Post-op Pain Management:    Induction: Intravenous  Airway Management Planned: Nasal Cannula  Additional Equipment:   Intra-op Plan:   Post-operative Plan:   Informed Consent: I have reviewed the patients History and Physical, chart, labs and discussed the procedure including the risks, benefits and alternatives for the proposed anesthesia with the patient or authorized representative who has indicated his/her understanding and acceptance.   Dental advisory given  Plan Discussed with: CRNA and Anesthesiologist  Anesthesia Plan Comments:         Anesthesia Quick Evaluation

## 2014-08-19 NOTE — Progress Notes (Signed)
ANTICOAGULATION CONSULT NOTE - Follow Up Consult  Pharmacy Consult for heparin Indication: atrial fibrillation  Labs:  Recent Labs  08/17/14 0320 08/17/14 1022  08/18/14 0249 08/18/14 1207 08/18/14 2208 08/19/14 0334  HGB 8.5*  --   --  8.1*  --   --  9.3*  HCT 25.7*  --   --  24.2*  --   --  27.4*  PLT 193  --   --  189  --   --  228  LABPROT 17.1*  --   --  18.2*  --   --   --   INR 1.38  --   --  1.50*  --   --   --   HEPARINUNFRC  --   --   < > <0.10* 0.16* <0.10* 0.20*  CREATININE 5.40* 5.98*  --   --   --   --   --   < > = values in this interval not displayed.   Assessment: 63yo male remains subtherapeutic on heparin after resumed though getting closer to goal.  Goal of Therapy:  Heparin level 0.3-0.7 units/ml   Plan:  Will increase heparin gtt by 2 units/kg/hr to 2500 units/hr and check level in [place], PharmD, BCPS  08/19/2014,4:40 AM

## 2014-08-19 NOTE — Progress Notes (Signed)
PT Cancellation Note  Patient Details Name: [name] [name] MRN: [id number] DOB: [birth year]   Cancelled Treatment:    Reason Eval/Treat Not Completed: Patient at procedure or test/unavailable   Dniya Neuhaus 08/19/2014, 10:50 AM

## 2014-08-19 NOTE — CV Procedure (Signed)
See full TEE report in [employer]; patient sedated by anesthesia with diprovan 160 mg IV total; no LA thrombus; LAA previously ligated; patient subsequently underwent synchronized DCCV with 120 J to NSR; no immediate complications; continue coumadin. Kirk Ruths

## 2014-08-19 NOTE — Transfer of Care (Signed)
Immediate Anesthesia Transfer of Care Note  Patient: [name] [name]  Procedure(s) Performed: Procedure(s): CANCELLED PROCEDURE  Patient Location: Endoscopy Unit  Anesthesia Type:MAC  Level of Consciousness: awake, alert  and oriented  Airway & Oxygen Therapy: Patient Spontanous Breathing and Patient connected to nasal cannula oxygen  Post-op Assessment: Report given to PACU RN, Post -op Vital signs reviewed and stable and Patient moving all extremities X 4  Post vital signs: Reviewed and stable  Complications: No apparent anesthesia complications

## 2014-08-19 NOTE — Progress Notes (Signed)
  Echocardiogram Echocardiogram Transesophageal has been performed.  [name] 08/19/2014, 9:47 AM

## 2014-08-19 NOTE — Progress Notes (Signed)
ANTICOAGULATION CONSULT NOTE - Follow Up Consult  Pharmacy Consult for Heparin  Indication: atrial fibrillation  Labs:  Recent Labs  08/16/14 0430 08/17/14 0320 08/17/14 1022  08/18/14 0249 08/18/14 1207 08/18/14 2208  HGB 8.0* 8.5*  --   --  8.1*  --   --   HCT 24.5* 25.7*  --   --  24.2*  --   --   PLT 194 193  --   --  189  --   --   LABPROT  --  17.1*  --   --  18.2*  --   --   INR  --  1.38  --   --  1.50*  --   --   HEPARINUNFRC  --   --   --   < > <0.10* 0.16* <0.10*  CREATININE  --  5.40* 5.98*  --   --   --   --   < > = values in this interval not displayed.   Assessment: Undetectable HL drawn ~2200 is INACCURATE. Heparin was OFF from ~1900-2200. Will continue heparin at current rate and check AM HL.   Goal of Therapy:  Heparin level 0.3-0.7 units/ml Monitor platelets by anticoagulation protocol: Yes   Plan:  -Continue heparin at 2350 units/hr -AM HL  Sadler, Teschner 08/19/2014,12:09 AM

## 2014-08-19 NOTE — Progress Notes (Signed)
ANTICOAGULATION + ANTIBIOTIC CONSULT NOTE - Follow Up Consult  Pharmacy Consult for Heparin and Coumadin; Vancomycin Indication: atrial fibrillation and MRSA in pleural effusion  Allergies  Allergen Reactions  . Bacitracin Other (See Comments)    unknown  . Norvasc [Amlodipine Besylate] Other (See Comments)    unknown    Patient Measurements: Height: 6\' 4"  (193 cm) Weight: 194 lb 14.2 oz (88.4 kg) IBW/kg (Calculated) : 86.8 Heparin Dosing Weight: 88.4 kg  Vital Signs: Temp: 98.2 F (36.8 C) (12/23 1004) Temp Source: Oral (12/23 1004) BP: 86/47 mmHg (12/23 1200) Pulse Rate: 30 (12/23 1130)  Labs:  Recent Labs  08/17/14 0320 08/17/14 1022  08/18/14 0249 08/18/14 1207 08/18/14 2208 08/19/14 0334  HGB 8.5*  --   --  8.1*  --   --  9.3*  HCT 25.7*  --   --  24.2*  --   --  27.4*  PLT 193  --   --  189  --   --  228  LABPROT 17.1*  --   --  18.2*  --   --  26.0*  INR 1.38  --   --  1.50*  --   --  2.36*  HEPARINUNFRC  --   --   < > <0.10* 0.16* <0.10* 0.19*  0.20*  CREATININE 5.40* 5.98*  --   --   --   --  5.56*  < > = values in this interval not displayed.  Estimated Creatinine Clearance: 16.3 mL/min (by C-G formula based on Cr of 5.56).  Assessment:   Heparin drip increased early this am with low level, but INR 1.38->1.50-> 2.36 after Coumadin 4 mg then 2.5 mg the last 2 days.  S/p TEE cardioversion this am.  On amiodarone drip, but was on amiodarone prior to admission, too.     Home Coumadin dose was 1.25 mg daily, but INR on admit 12/17 was 4.32.  Vitamin K 10 mg IV given on 12/19, thoracentesis done 12/20 when INR down to 1.80.    Day # 7 Vancomycin.     MRSA in pleural fluid culture 08/16/14. MIC to Vanc = 1. Vancomycin given with HD on 12/22.  Usual TTS HD.    MRSA screen positive - on CHG/Bactroban x 5 days.   Goal of Therapy:  INR 2-3 Monitor platelets by anticoagulation protocol: Yes   Plan:   Heparin drip discontinued with therapeutic INR (d/w Dr.  Stanford Breed).  Coumadin 1 mg today.  Daily PT/INR for now.  Continue Vancomycin 1 gram IV with HD-TTS.  Will follow up for any need to adjust timing of Vanc doses if  HD schedule changes due to the holiday.  Arty Baumgartner,  Pager: [phone] 08/19/2014,12:13 PM

## 2014-08-20 ENCOUNTER — Encounter (HOSPITAL_COMMUNITY): Payer: Self-pay | Admitting: Cardiology

## 2014-08-20 LAB — BASIC METABOLIC PANEL
ANION GAP: 15 (ref 5–15)
BUN: 37 mg/dL — ABNORMAL HIGH (ref 6–23)
CALCIUM: 8.3 mg/dL — AB (ref 8.4–10.5)
CO2: 23 mmol/L (ref 19–32)
Chloride: 100 mEq/L (ref 96–112)
Creatinine, Ser: 7.59 mg/dL — ABNORMAL HIGH (ref 0.50–1.35)
GFR, EST AFRICAN AMERICAN: 8 mL/min — AB (ref >90)
GFR, EST NON AFRICAN AMERICAN: 7 mL/min — AB (ref >90)
Glucose, Bld: 130 mg/dL — ABNORMAL HIGH (ref 70–99)
Potassium: 3.8 mmol/L (ref 3.5–5.1)
SODIUM: 138 mmol/L (ref 135–145)

## 2014-08-20 LAB — CBC
HEMATOCRIT: 26.1 % — AB (ref 39.0–52.0)
HEMOGLOBIN: 8.9 g/dL — AB (ref 13.0–17.0)
MCH: 28.1 pg (ref 26.0–34.0)
MCHC: 34.1 g/dL (ref 30.0–36.0)
MCV: 82.3 fL (ref 78.0–100.0)
PLATELETS: 198 10*3/uL (ref 150–400)
RBC: 3.17 MIL/uL — AB (ref 4.22–5.81)
RDW: 17.5 % — ABNORMAL HIGH (ref 11.5–15.5)
WBC: 9.4 10*3/uL (ref 4.0–10.5)

## 2014-08-20 LAB — GLUCOSE, CAPILLARY
GLUCOSE-CAPILLARY: 124 mg/dL — AB (ref 70–99)
GLUCOSE-CAPILLARY: 118 mg/dL — AB (ref 70–99)
Glucose-Capillary: 150 mg/dL — ABNORMAL HIGH (ref 70–99)

## 2014-08-20 LAB — PROTIME-INR
INR: 4.96 — ABNORMAL HIGH (ref 0.00–1.49)
Prothrombin Time: 46.5 seconds — ABNORMAL HIGH (ref 11.6–15.2)

## 2014-08-20 MED ORDER — VANCOMYCIN HCL IN DEXTROSE 1-5 GM/200ML-% IV SOLN: 1000.0000 mg | INTRAVENOUS | Status: AC

## 2014-08-20 MED ORDER — MIDODRINE HCL 10 MG PO TABS: 10.0000 mg | ORAL_TABLET | Freq: Two times a day (BID) | ORAL | Status: AC

## 2014-08-20 MED ORDER — AMIODARONE HCL 400 MG PO TABS: 400.0000 mg | ORAL_TABLET | Freq: Every day | ORAL | Status: DC

## 2014-08-20 NOTE — Progress Notes (Signed)
OT Cancellation Note  Patient Details Name: MAXIE SLOVACEK MRN: [id number] DOB: 07/21/1951   Cancelled Treatment:    Reason Eval/Treat Not Completed: Patient at procedure or test/ unavailable.  Will reattempt.   Darlina Rumpf [place], OTR/L [phone]  08/20/2014, 3:22 PM

## 2014-08-20 NOTE — Procedures (Signed)
Tolerating HD.  We are hopeful for no significant tachycardia post op since DCCV and will limit fluid removal. [name] [name]

## 2014-08-20 NOTE — Discharge Summary (Signed)
[name] [name], 63 y.o., DOB [birth year], MRN [id number]. Admission date: 08/13/2014 Discharge Date 08/20/2014 Primary MD [name], MD Admitting Physician Doree Albee, MD  Admission Diagnosis  Weakness [R53.1] HCAP (healthcare-associated pneumonia) [J18.9] Abdominal pain [R10.9] Anemia, unspecified anemia type [D64.9]  PCP please follow: - Check patient's CBC, BMP, INR level with next visit, warfarin is currently being held, INR to be checked on your next visit, resume warfarin at low dose as patient is very sensitive to warfarin, INR is 4.96 at day of discharge.  Discharge Diagnosis   Principal Problem:   Loculated pleural effusion Active Problems:   Atrial fibrillation   Barb term current use of anticoagulant therapy   ESRD (end stage renal disease) on dialysis   DM type 2 causing ESRD   Septic shock   Pleural effusion, right   Atrial fibrillation with rapid ventricular response   Absolute anemia   HCAP (healthcare-associated pneumonia)   S/P thoracentesis   Hemodialysis-associated hypotension   Idiopathic hypotension      Past Medical History  Diagnosis Date  . UGI bleed 02/12/11    On anticoagulation; EGD w/snare polypectomy-multiple polypoid lesions antrum(benign), Chronic active gastritis (NEGATIVE H pylori)  . Anemia, chronic disease   . Sepsis due to enterococcus 02/11/11  . Atrial fibrillation     On coumadin  . Coronary atherosclerosis of native coronary artery     Multivessel status post CABG  . Cardiomyopathy     LVEF 40-45% 01/2011  . Type 2 diabetes mellitus   . Essential hypertension, benign   . Hyperlipemia   . Gout   . S/P patent foramen ovale closure   . Hyperbilirubinemia 08/10/2013  . SBO (small bowel obstruction) 01/2014  . 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 Clostridium difficile colitis 01/2014  . Diverticulosis   . Internal hemorrhoids   . ESRD on hemodialysis     Started dialysis sometime around 2008-2009.  Gets HD in [place], [place] with  Dr Hinda Lenis on a TTS schedule.  Runs 3.5 hours with dry wt 86.5 kg as of June 2015.  Had a L arm AVF which didn't work and has been using his R forearm AVF for several years.      Past Surgical History  Procedure Laterality Date  . Mitral valve replacement  01/05/2007    Bioprosthetic 27mm Edwards precordial tissue (Dr. Servando Snare)  . Coronary artery bypass graft  01/05/2007    LIMA-LAD, SVG-OM, seq. SVG-PDA, PLA-RCA  . Colonoscopy  06/23/2011    Dr. Oneida Alar: multiple sessile and pedunculated polyps, moderate diverticulosis, internal hemorrhoids, +ADENOMATOUS POLYPS, consider genetic testing, surveillance in October 2015   . Esophagogastroduodenoscopy  02/12/2011    CHRONIC ACTIVE GASTRITIS, NO H. pylori  . Transthoracic echocardiogram  11/2011    EF 25% w/ mod dilatation of LV & mod LVH; LA severely dilated; mod-severe dilation of RV with mod-severe decrease in RV function; mild MR & TR  . Cardioversion  04/19/2007    Dr. Marella Chimes  . Transthoracic echocardiogram  02/14/2011    EF 40-45%, mod conc LVH; ventricular septum with motion showing abnormal function, dyssynergy, paradox; mild AS; mild-mod MR stenosis; LA severely dilated; aptrial septum bowed from L to R with increased LA pressure   . Amputation Right 07/14/2013    Procedure: AMPUTATION DIGIT;  Surgeon: [name], MD;  Location: [facility];  Service: General;  Laterality: Right;  . Amputation Right 08/08/2013    Procedure: AMPUTATION BELOW KNEE;  Surgeon: [name],  MD;  Location: [facility];  Service: General;  Laterality: Right;  . Stump revision Right 09/19/2013    Procedure: STUMP REVISION;  Surgeon: Scherry Ran, MD;  Location: [facility];  Service: General;  Laterality: Right;  . Amputation Right 10/17/2013    Procedure: AMPUTATION ABOVE KNEE;  Surgeon: [name], MD;  Location: [facility];  Service: General;  Laterality: Right;  . Colonoscopy N/A 04/10/2014    Procedure: COLONOSCOPY;  Surgeon: [name], MD;  Location:  [facility];  Service: Endoscopy;  Laterality: N/A;  830  . Tee without cardioversion N/A 08/19/2014    Procedure: TRANSESOPHAGEAL ECHOCARDIOGRAM (TEE);  Surgeon: [name], MD;  Location: [facility] ENDOSCOPY;  Service: Cardiovascular;  Laterality: N/A;  . Cardioversion N/A 08/19/2014    Procedure: CARDIOVERSION;  Surgeon: [name], MD;  Location: [facility] ENDOSCOPY;  Service: Cardiovascular;  Laterality: N/A;    Admission history of present illness/brief narrative: This is a 63 year old male w/ complicated med hx: ESRD, type II DM, CABG/ MVR in 2008, CHF, cardiomyopathy (EF 15-20 %), atrial fibrillation, GI bleeds, SBP, pneumatosis coli, c diff colitis. Admitted 12/17 w/ working dx of secondary infection of chronic loculated right effusion. Was being treated w/ ABX IV vancomycin and cefepime since admission 12/17, seen by CT surgery, initially with supratherapeutic INR corrected w/ Vit K for planned thoracentesis.Patient had thoracocentesis on 08/16/14 with 1 L drained, appears exudate, pleural effusion culture growing MRSA , a shunt was started on IV vancomycin, to finish total of 2 weeks as per ID recommendation .  Patient hospital stay complicated by hypotension on 12/19 after hemodialysis, felt to be septic/cardiac/hypovolemic shock, as well A. fib with RVR initially on Cardizem drip, but change to amiodarone drip secondary to hypotension. Patient continued to have significant hypotension and A. fib with RVR after each hemodialysis, SO Patient had DCCV done 12/23 , and at atrial fibrillation converted to normal sinus rhythm , where he is being discharged on amiodarone 400 mg oral daily, recommendation to follow with cardiology as an outpatient. Hospital Course See H&P, Labs, Consult and Test reports for all details in brief, patient was admitted for **  Principal Problem:   Loculated pleural effusion Active Problems:   Atrial fibrillation   Kasperski term current use of anticoagulant  therapy   ESRD (end stage renal disease) on dialysis   DM type 2 causing ESRD   Septic shock   Pleural effusion, right   Atrial fibrillation with rapid ventricular response   Absolute anemia   HCAP (healthcare-associated pneumonia)   S/P thoracentesis   Hemodialysis-associated hypotension   Idiopathic hypotension  Pleural effusion, right/ Loculated pleural effusion: - Patient underwent thoracocentesis 12/20 with 1 L drained. - e having mixed picture, as exudative by protein criteria but not by LDH criteria, cell count consistent with a parapneumonic effusion, normal pH and LDH and consistent with infected pleural space, pulmonary input appreciated, this is likely the natural progression of hemothorax. - CT surgery consult appreciated, - empirically on vanc and cefepime 12/17 -12/21pleural effusion culture growing MRSA , so patient resumed on IV vancomycin to finish total of 2 weeks , end date 08/27/14.Marland Kitchen  Hypotension: - Appears multifactorial cardiac, sepsis, hypovolemia. -Discussed with nephrology, patient is usually hypotensive after hemodialysis, that's why he is on midodrine, can tolerate systolic blood pressure in the low 80s and high 70s as Perri patient is asymptomatic. -DuraGen was increased during hospital stay, patient went from 5 mg oral 3 times a day  to 10 mg oral 2 times daily.  Atrial fibrillation/Blanck term current use of anticoagulant therapy:Initially - Initially on amiodarone drip, cardiology consult appreciated. -Had TEE guided cardioversion 12/23, TEE without thrombosis, successful DCCV, converted to normal sinus rhythm , will discharge on 400 mg amiodarone daily, to follow with Dr. Domenic Polite as an outpatient. - Resumed on warfarin, INR is supra therapeutic on discharge 4.96.  Coagulopathy with supratherapeutic INR. - Patient seems to be very sensitive for warfarin, initially came with supratherapeutic INR required multiple doses of IV vitamin K so it became less than  1.5 which was suitable for thoracocentesis. - Received 2.5 mg on 12/22 , and 1 mg on 12/23 , and INR became supratherapeutic at 4.9, so patient was instructed to hold his warfarin for the next 3 days to his next hemodialysis and have his INR checked then, if it's subtherapeutic, would recommend resume on very low dose warfarin and monitor closely.  ESRD (end stage renal disease) on dialysis: - Renal consulted - One unit packed red blood cell transfusion 12/20, 12/22  DM type 2 causing ESRD -Resume Lantus on discharge    Code Status: full Family Communication: wife at bedside Disposition : transfer to telemetry   Consultants:  Renal  CT Surgery  PCCM  Cardiology  Procedures:  Ct abd and pelvis  Thoracocentesis with 1 L drained 12/20  One unit packed red blood cell transfusion 12/20,12/22  TEE with cardioversion 12/23    Antibiotics   cefepime 12/17-12/22  Vancomycin 12/17 to continue until 12/31.   Results for orders placed or performed during the hospital encounter of 08/13/14  Blood culture (routine x 2)     Status: None   Collection Time: 08/13/14  5:45 PM  Result Value Ref Range Status   Specimen Description BLOOD LEFT HAND  Final   Special Requests BOTTLES DRAWN AEROBIC ONLY 4CC  Final   Culture NO GROWTH 5 DAYS  Final   Report Status 08/18/2014 FINAL  Final  Blood culture (routine x 2)     Status: None   Collection Time: 08/13/14  5:55 PM  Result Value Ref Range Status   Specimen Description BLOOD LEFT ARM  Final   Special Requests   Final    BOTTLES DRAWN AEROBIC AND ANAEROBIC AEB=8CC ANA=6CC   Culture NO GROWTH 5 DAYS  Final   Report Status 08/18/2014 FINAL  Final  MRSA PCR Screening     Status: Abnormal   Collection Time: 08/14/14  2:05 AM  Result Value Ref Range Status   MRSA by PCR POSITIVE (A) NEGATIVE Final    Comment:        The GeneXpert MRSA Assay (FDA approved for NASAL specimens only), is one component of a comprehensive MRSA  colonization surveillance program. It is not intended to diagnose MRSA infection nor to guide or monitor treatment for MRSA infections. RESULT CALLED TO, READ BACK BY AND VERIFIED WITH: D RICE,RN [id number] [facility]   Body fluid culture     Status: None   Collection Time: 08/16/14 10:33 AM  Result Value Ref Range Status   Specimen Description PLEURAL RIGHT FLUID  Final   Special Requests NONE  Final   Gram Stain   Final    FEW WBC PRESENT, PREDOMINANTLY PMN NO SQUAMOUS EPITHELIAL CELLS SEEN NO ORGANISMS SEEN Performed at [facility]    Culture   Final    FEW METHICILLIN RESISTANT STAPHYLOCOCCUS AUREUS Note: RIFAMPIN AND GENTAMICIN SHOULD NOT BE USED AS SINGLE DRUGS FOR TREATMENT  OF STAPH INFECTIONS. CRITICAL RESULT CALLED TO, READ BACK BY AND VERIFIED WITH: AMANDA RAWSON 08/18/14 1245 BY SMITHERSJ This organism is presumed to be Clindamycin resistant  based on detection of inducible Clindamycin resistance. Performed at [facility]    Report Status 08/19/2014 FINAL  Final   Organism ID, Bacteria METHICILLIN RESISTANT STAPHYLOCOCCUS AUREUS  Final      Susceptibility   Methicillin resistant staphylococcus aureus - MIC*    CLINDAMYCIN RESISTANT      ERYTHROMYCIN >=8 RESISTANT Resistant     GENTAMICIN <=0.5 SENSITIVE Sensitive     LEVOFLOXACIN >=8 RESISTANT Resistant     OXACILLIN >=4 RESISTANT Resistant     PENICILLIN >=0.5 RESISTANT Resistant     RIFAMPIN <=0.5 SENSITIVE Sensitive     TRIMETH/SULFA <=10 SENSITIVE Sensitive     VANCOMYCIN 1 SENSITIVE Sensitive     TETRACYCLINE <=1 SENSITIVE Sensitive     * FEW METHICILLIN RESISTANT STAPHYLOCOCCUS AUREUS    Significant Tests:  See full reports for all details    Dg Chest 1 View  08/16/2014   CLINICAL DATA:  Status post thoracentesis  EXAM: CHEST - 1 VIEW  COMPARISON:  August 13, 2014  FINDINGS: There is no appreciable pneumothorax. There is less effusion on the right compared to recent prior study. There  is asymmetric edema in the right lung with patchy consolidation in the right mid and lower lung zones. On the left, there is atelectatic change in the lateral base region. There is a slight degree of interstitial edema on the left. Heart is enlarged. Patient is status post mitral valve replacement as well as coronary artery bypass grafting. There is a degree of pulmonary venous hypertension. No adenopathy.  IMPRESSION: Less effusion on the right compared to recent prior study. There is right lower lobe consolidation. There is a degree of underlying congestive heart failure. There is somewhat more edema on the right than on the left. No pneumothorax.   Electronically Signed   By: Lowella Grip M.D.   On: 08/16/2014 11:01   Dg Chest 2 View  08/13/2014   CLINICAL DATA:  Weakness and abdominal pain.  Diabetes.  HTN.  EXAM: CHEST  2 VIEW  COMPARISON:  CT chest today  FINDINGS: Right pleural effusion, loculated on CT. Right lower lobe atelectasis or infiltrate. This could represent pneumonia and empyema. Correlate with fever and white count.  Cardiac enlargement with mild vascular congestion. No edema. Left lung clear.  Stent in the right upper lobe pulmonary artery of uncertain significance. This is unchanged from a CT of 12/06/2011.  IMPRESSION: Cardiac enlargement with vascular congestion.  Negative for edema.  Right pleural effusion and right lower lobe airspace disease. Possible pneumonia. Correlate with clinical symptoms.   Electronically Signed   By: Franchot Gallo M.D.   On: 08/13/2014 15:37   Dg Ankle Complete Left  08/07/2014   3 views left ankle for left ankle fibular fracture  This is a one-week follow-up x-ray in this very severe diabetic with  peripheral vascular disease  Calcifications are seen in the arterial tree in the lower leg and ankle  into the foot he has a nondisplaced Weber B type ankle fracture of the  fibula with an intact ankle mortise  Severe osteopenia is also noted from disuse   Impression stable nondisplaced fibular fracture  Dg Ankle Complete Left  07/28/2014   CLINICAL DATA:  Left ankle pain secondary to a fall 1 week ago.  EXAM: LEFT ANKLE COMPLETE - 3+ VIEW  COMPARISON:  None.  FINDINGS: There is a slightly displaced spiral fracture of the distal fibula. There are slight arthritic spurs on the distal tibia. There is extensive arterial vascular calcification around the ankle. Small ankle joint effusion.  IMPRESSION: Minimally displaced spiral fracture of the distal fibula.   Electronically Signed   By: Rozetta Nunnery M.D.   On: 07/28/2014 13:08   Ct Head Wo Contrast  08/13/2014   CLINICAL DATA:  Altered mental status and low-grade fever  EXAM: CT HEAD WITHOUT CONTRAST  TECHNIQUE: Contiguous axial images were obtained from the base of the skull through the vertex without intravenous contrast.  COMPARISON:  February 11, 2011  FINDINGS: Moderate diffuse atrophy is stable. Prominence of the cisterna magna is an anatomic variant. There is no demonstrable intracranial mass, acute hemorrhage, extra-axial fluid collection, or midline shift. There is evidence of a prior infarct in the mid left parietal lobe, stable. There is evidence of a prior infarct in the mid portion of the left external capsule, stable. A small focus of calcification in this area may represent residua of prior hemorrhage. There is patchy small vessel disease throughout the centra semiovale bilaterally. There is no new gray-white compartment lesion. No acute appearing infarct appreciable. The bony calvarium appears intact. The mastoid air cells are clear.  IMPRESSION: Atrophy with prior infarcts and small vessel disease, stable. No acute hemorrhage. No mass or extra-axial fluid collection. No acute appearing infarct.   Electronically Signed   By: Lowella Grip M.D.   On: 08/13/2014 15:08   Ct Abdomen Pelvis W Contrast  08/13/2014   CLINICAL DATA:  Weakness.  Dialysis patient.  Diabetes.  EXAM: CT ABDOMEN AND PELVIS  WITH CONTRAST  TECHNIQUE: Multidetector CT imaging of the abdomen and pelvis was performed using the standard protocol following bolus administration of intravenous contrast.  CONTRAST:  175mL OMNIPAQUE IOHEXOL 300 MG/ML  SOLN  COMPARISON:  CT abdomen pelvis 02/27/2014  FINDINGS: Loculated right lower pleural effusion measures 9 x 15 cm. This has enhancing wall. There was high-density in this fluid on the prior CT and this may be an organizing hemothorax. Empyema not excluded. If the patient has fever and white count, thoracentesis recommended. There is compressive atelectasis in the right lower lobe. Calcified granuloma left lower lobe unchanged. Small nodule left lower lobe not present but this area was not scanned today.  Cardiac enlargement with coronary artery calcification and CABG.  Liver and spleen normal. Small calcified gallstones. Gallbladder is contracted and not thickened. Bile ducts nondilated. Pancreas and spleen are normal.  Small kidneys due to chronic renal failure. No renal mass or hydronephrosis.  Negative for bowel obstruction. No bowel thickening. Negative for diverticulitis. Normal appendix. Resolution of thickening of the cecum compared with the prior study which may be resolution of colitis. No underlying mass.  Advanced atherosclerotic disease without aortic aneurysm.  Chronic compression fracture L1 unchanged. Mild chronic fracture L4 unchanged. No acute spinal abnormality.  IMPRESSION: Large loculated right pleural effusion. This may represent contracting hemothorax based on the prior CT. If the patient has a fever or white count, thoracentesis recommended to exclude empyema.  Calcified gallstones.  Normal appendix.  Resolution of cecal thickening since the prior CT.   Electronically Signed   By: Franchot Gallo M.D.   On: 08/13/2014 15:18   US Thoracentesis Asp Pleural Space W/img Guide  08/16/2014   INDICATION: Patient with history of end-stage renal disease, CHF, chronic right  effusion. Request is made for diagnostic and therapeutic  right thoracentesis.  EXAM: ULTRASOUND GUIDED DIAGNOSTIC AND THERAPEUTIC RIGHT THORACENTESIS  COMPARISON:  None.  MEDICATIONS: None  COMPLICATIONS: None immediate  TECHNIQUE: Informed written consent was obtained from the patient after a discussion of the risks, benefits and alternatives to treatment. A timeout was performed prior to the initiation of the procedure.  Initial ultrasound scanning demonstrates a moderate-to-large right pleural effusion. The lower chest was prepped and draped in the usual sterile fashion. 1% lidocaine was used for local anesthesia.  Under direct ultrasound guidance, a 19 gauge, 7-cm, Yueh catheter was introduced. An ultrasound image was saved for documentation purposes. The thoracentesis was performed. The catheter was removed and a dressing was applied. The patient tolerated the procedure well without immediate post procedural complication. The patient was escorted to have an upright chest radiograph.  FINDINGS: A total of approximately 1 liter of thick, dark bloody fluid was removed. Only the above amount of fluid could be aspirated at this time. Requested samples were sent to the laboratory.  IMPRESSION: Successful ultrasound-guided diagnostic and therapeutic right sided thoracentesis yielding 1 liter of pleural fluid.  Read by: Rowe Robert, PA-C   Electronically Signed   By: Markus Daft M.D.   On: 08/16/2014 10:31     Today   Subjective:   [name] [name] today has no headache,no chest abdominal pain,no new weakness tingling or numbness, feels much better wants to go home today.   Objective:   Blood pressure 107/57, pulse 73, temperature 97.6 F (36.4 C), temperature source Oral, resp. rate 27, height 6\' 4"  (1.93 m), weight 90.1 kg (198 lb 10.2 oz), SpO2 98 %.  Intake/Output Summary (Last 24 hours) at 08/20/14 1523 Last data filed at 08/20/14 4010  Gross per 24 hour  Intake    360 ml  Output      0 ml  Net     360 ml    Exam General: Alert, awake, oriented x3, in no acute distress.  HEENT: No bruits, no goiter.  Heart: RRR, no rubs, gallops. Lungs: Good air movement,  Abdomen: Soft, nontender, nondistended, positive bowel sounds.  EXT Right AKA  Data Review     CBC w Diff: Lab Results  Component Value Date   WBC 9.4 08/20/2014   HGB 8.9* 08/20/2014   HCT 26.1* 08/20/2014   HCT 35 05/04/2011   HCT 30.9 05/04/2011   PLT 198 08/20/2014   LYMPHOPCT 4* 08/13/2014   MONOPCT 10 08/13/2014   EOSPCT 0 08/13/2014   BASOPCT 0 08/13/2014   CMP: Lab Results  Component Value Date   NA 138 08/20/2014   K 3.8 08/20/2014   K 4.4 05/04/2011   CL 100 08/20/2014   CL 101 05/04/2011   CO2 23 08/20/2014   CO2 25 05/04/2011   BUN 37* 08/20/2014   CREATININE 7.59* 08/20/2014   PROT 6.6 08/16/2014   PROT 6.4 05/04/2011   ALBUMIN 2.0* 08/17/2014   BILITOT 2.0* 08/15/2014   ALKPHOS 151* 08/15/2014   AST 34 08/15/2014   AST 22 05/04/2011   ALT 24 08/15/2014  .  Micro Results Recent Results (from the past 240 hour(s))  Blood culture (routine x 2)     Status: None   Collection Time: 08/13/14  5:45 PM  Result Value Ref Range Status   Specimen Description BLOOD LEFT HAND  Final   Special Requests BOTTLES DRAWN AEROBIC ONLY 4CC  Final   Culture NO GROWTH 5 DAYS  Final   Report Status 08/18/2014 FINAL  Final  Blood  culture (routine x 2)     Status: None   Collection Time: 08/13/14  5:55 PM  Result Value Ref Range Status   Specimen Description BLOOD LEFT ARM  Final   Special Requests   Final    BOTTLES DRAWN AEROBIC AND ANAEROBIC AEB=8CC ANA=6CC   Culture NO GROWTH 5 DAYS  Final   Report Status 08/18/2014 FINAL  Final  MRSA PCR Screening     Status: Abnormal   Collection Time: 08/14/14  2:05 AM  Result Value Ref Range Status   MRSA by PCR POSITIVE (A) NEGATIVE Final    Comment:        The GeneXpert MRSA Assay (FDA approved for NASAL specimens only), is one component of  a comprehensive MRSA colonization surveillance program. It is not intended to diagnose MRSA infection nor to guide or monitor treatment for MRSA infections. RESULT CALLED TO, READ BACK BY AND VERIFIED WITH: D RICE,RN [id number] [facility]   Body fluid culture     Status: None   Collection Time: 08/16/14 10:33 AM  Result Value Ref Range Status   Specimen Description PLEURAL RIGHT FLUID  Final   Special Requests NONE  Final   Gram Stain   Final    FEW WBC PRESENT, PREDOMINANTLY PMN NO SQUAMOUS EPITHELIAL CELLS SEEN NO ORGANISMS SEEN Performed at [facility]    Culture   Final    FEW METHICILLIN RESISTANT STAPHYLOCOCCUS AUREUS Note: RIFAMPIN AND GENTAMICIN SHOULD NOT BE USED AS SINGLE DRUGS FOR TREATMENT OF STAPH INFECTIONS. CRITICAL RESULT CALLED TO, READ BACK BY AND VERIFIED WITH: AMANDA RAWSON 08/18/14 1245 BY SMITHERSJ This organism is presumed to be Clindamycin resistant  based on detection of inducible Clindamycin resistance. Performed at [facility]    Report Status 08/19/2014 FINAL  Final   Organism ID, Bacteria METHICILLIN RESISTANT STAPHYLOCOCCUS AUREUS  Final      Susceptibility   Methicillin resistant staphylococcus aureus - MIC*    CLINDAMYCIN RESISTANT      ERYTHROMYCIN >=8 RESISTANT Resistant     GENTAMICIN <=0.5 SENSITIVE Sensitive     LEVOFLOXACIN >=8 RESISTANT Resistant     OXACILLIN >=4 RESISTANT Resistant     PENICILLIN >=0.5 RESISTANT Resistant     RIFAMPIN <=0.5 SENSITIVE Sensitive     TRIMETH/SULFA <=10 SENSITIVE Sensitive     VANCOMYCIN 1 SENSITIVE Sensitive     TETRACYCLINE <=1 SENSITIVE Sensitive     * FEW METHICILLIN RESISTANT STAPHYLOCOCCUS AUREUS     Discharge Instructions      Follow-up Information    Follow up with [name], MD In 1 week.   Specialty:  Internal Medicine   Contact information:   [address] [place] [address] [phone]       Follow up with Rozann Lesches, MD. Schedule an  appointment as soon as possible for a visit in 1 week.   Specialty:  Cardiology   Contact information:   [place] [address] [phone]       Discharge Medications  Patient warfarin has been held on discharge secondary to supratherapeutic INR, please recheck INR in 3 days, and if below 3, resume warfarin at much lower dose, as INR 4.96 orally after receiving 2 doses of warfarin(2.5 mg 12/22 and 1 mg 12/23)   Medication List    STOP taking these medications        warfarin 2.5 MG tablet  Commonly known as:  COUMADIN      TAKE these medications  albuterol (2.5 MG/3ML) 0.083% nebulizer solution  Commonly known as:  PROVENTIL  Take 3 mLs (2.5 mg total) by nebulization every 2 (two) hours as needed for wheezing.     allopurinol 100 MG tablet  Commonly known as:  ZYLOPRIM  Take 100 mg by mouth daily.     amiodarone 400 MG tablet  Commonly known as:  PACERONE  Take 1 tablet (400 mg total) by mouth daily.     atorvastatin 20 MG tablet  Commonly known as:  LIPITOR  Take 1 tablet (20 mg total) by mouth every evening.     DIALYVITE/ZINC PO  Take by mouth 3 (three) times a week. Patient has dialysis on Tuesdays, Thursdays, and Saturdays     folic acid 1 MG tablet  Commonly known as:  FOLVITE  Take 1 mg by mouth daily.     HYDROcodone-acetaminophen 5-325 MG per tablet  Commonly known as:  NORCO/VICODIN  Take 1 tablet by mouth every 6 (six) hours as needed for moderate pain.     insulin glargine 100 UNIT/ML injection  Commonly known as:  LANTUS  Inject 10 Units into the skin at bedtime.     metoprolol 50 MG tablet  Commonly known as:  LOPRESSOR  Take 75 mg by mouth 2 (two) times daily.     midodrine 10 MG tablet  Commonly known as:  PROAMATINE  Take 1 tablet (10 mg total) by mouth 2 (two) times daily with a meal.     pantoprazole 40 MG tablet  Commonly known as:  PROTONIX  Take 40 mg by mouth 2 (two) times daily.     RENVELA 800 MG tablet   Generic drug:  sevelamer carbonate  Take 800 mg by mouth 3 (three) times daily with meals.     SENSIPAR 30 MG tablet  Generic drug:  cinacalcet  Take 30 mg by mouth daily.     traMADol 50 MG tablet  Commonly known as:  ULTRAM  Take by mouth every 8 (eight) hours as needed for moderate pain.     traZODone 50 MG tablet  Commonly known as:  DESYREL  Take 1 tablet (50 mg total) by mouth at bedtime.     vancomycin 1 GM/200ML Soln  Commonly known as:  VANCOCIN  Inject 200 mLs (1,000 mg total) into the vein Every Tuesday,Thursday,and Saturday with dialysis.         Total Time in preparing paper work, data evaluation and todays exam - 35 minutes  Laritza Vokes M.D on 08/20/2014 at 3:23 PM  [place]  [phone]

## 2014-08-20 NOTE — Progress Notes (Signed)
Feels better today  "can I go home?"  BP 90/50 mmHg  Pulse 63  Temp(Src) 98.1 F (36.7 C) (Oral)  Resp 18  Ht 6\' 4"  (1.93 m)  Wt 194 lb 14.2 oz (88.4 kg)  BMI 23.73 kg/m2  SpO2 97%   Intake/Output Summary (Last 24 hours) at 08/20/14 1127 Last data filed at 08/20/14 6160  Gross per 24 hour  Intake    380 ml  Output      0 ml  Net    380 ml    Remains on IV vancomycin  Denies CP or SOB  Complete 2 weeks of IV antibiotics and then follow clinically

## 2014-08-20 NOTE — Progress Notes (Signed)
ANTICOAGULATION CONSULT NOTE - Follow Up Consult  Pharmacy Consult for Warfarin Indication: atrial fibrillation  Allergies  Allergen Reactions  . Bacitracin Other (See Comments)    unknown  . Norvasc [Amlodipine Besylate] Other (See Comments)    unknown    Patient Measurements: Height: 6\' 4"  (193 cm) Weight: 194 lb 14.2 oz (88.4 kg) IBW/kg (Calculated) : 86.8  Vital Signs: Temp: 98.1 F (36.7 C) (12/24 0645) Temp Source: Oral (12/24 0645) BP: 90/50 mmHg (12/24 0645) Pulse Rate: 63 (12/24 0645)  Labs:  Recent Labs  08/17/14 1022  08/18/14 0249 08/18/14 1207 08/18/14 2208 08/19/14 0334 08/20/14 0524  HGB  --   < > 8.1*  --   --  9.3* 8.9*  HCT  --   --  24.2*  --   --  27.4* 26.1*  PLT  --   --  189  --   --  228 198  LABPROT  --   --  18.2*  --   --  26.0* 46.5*  INR  --   --  1.50*  --   --  2.36* 4.96*  HEPARINUNFRC  --   < > <0.10* 0.16* <0.10* 0.19*  0.20*  --   CREATININE 5.98*  --   --   --   --  5.56* 7.59*  < > = values in this interval not displayed.  Estimated Creatinine Clearance: 11.9 mL/min (by C-G formula based on Cr of 7.59).   Assessment: 60 YOM who continues on warfarin for hx Afib with a SUPRAtherapeutic INR this morning (INR 4.96 << 2.36, goal of 2-3). The patient is s/p a successful TEE guided DCCV on 12/23. Hgb/Hct/Plt slight drop - no overt s/sx of bleeding noted. Will hold warfarin today.  Goal of Therapy:  INR 2-3   Plan:  1. Hold warfarin today 2. Will continue to monitor for any signs/symptoms of bleeding and will follow up with PT/INR in the a.m.   Alycia Rossetti, PharmD, BCPS Clinical Pharmacist Pager: [phone] 08/20/2014 8:12 AM

## 2014-08-20 NOTE — Evaluation (Signed)
Physical Therapy Evaluation Patient Details Name: [name] [name] MRN: [id number] DOB: 1950/09/01 Today's Date: 08/20/2014   History of Present Illness  This is a 63 year old man who presented to the emergency room with a one-day history of abdominal pain found to have pleural effusion s/p thoracentesis 12/20 and  DCCV 12/23 with Afib to NSR. Hx of ESRD and R AKA  Clinical Impression  Pt moving well but limited by current weakness and fatigue from hospitalization and lack of mobility. Pt encouraged to increase mobility, be OOB daily with staff and have wife bring prosthesis to initiate gait. Pt will benefit from acute therapy to maximize strength, balance, mobility and function to decrease burden of care and return pt to PLOF.       Follow Up Recommendations Home health PT;Supervision for mobility/OOB      Equipment Recommendations  None recommended by PT    Recommendations for Other Services       Precautions / Restrictions Precautions Precautions: Fall Required Braces or Orthoses: Other Brace/Splint Other Brace/Splint: left cast boot secondary to fx per pt      Mobility  Bed Mobility Overal bed mobility: Needs Assistance Bed Mobility: Rolling;Sidelying to Sit Rolling: Supervision Sidelying to sit: Min assist       General bed mobility comments: cues for use of rail and assist to fully elevate trunk from surface  Transfers Overall transfer level: Needs assistance   Transfers: Lateral/Scoot Transfers          Lateral/Scoot Transfers: Min assist General transfer comment: pt unable to achieve standing at this time and performed lateral scoot transfer bed to chair. Pt actually scooted onto chair armrest then over into chair with cues and min assist.   Ambulation/Gait Ambulation/Gait assistance:  (unable at this time. Prosthesis not present)              Stairs            Wheelchair Mobility    Modified Rankin (Stroke Patients Only)       Balance  Overall balance assessment: Needs assistance   Sitting balance-Leahy Scale: Good       Standing balance-Leahy Scale: Poor                               Pertinent Vitals/Pain Pain Assessment: No/denies pain    Home Living Family/patient expects to be discharged to:: Private residence Living Arrangements: Spouse/significant other Available Help at Discharge: Family;Available 24 hours/day Type of Home: House Home Access: Ramped entrance     Home Layout: One level Home Equipment: Wheelchair - manual;Bedside commode;Walker - standard;Shower seat      Prior Function Level of Independence: Needs assistance   Gait / Transfers Assistance Needed: independent with a walker limited distance grossly 10' since ankle fx and mostly using WC  ADL's / Homemaking Assistance Needed: assist with bathing and dressing from wife        Hand Dominance        Extremity/Trunk Assessment   Upper Extremity Assessment: Generalized weakness           Lower Extremity Assessment: Generalized weakness      Cervical / Trunk Assessment: Normal  Communication   Communication: No difficulties  Cognition Arousal/Alertness: Awake/alert Behavior During Therapy: WFL for tasks assessed/performed Overall Cognitive Status: Within Functional Limits for tasks assessed  General Comments      Exercises General Exercises - Lower Extremity Ennen Arc Quad: AROM;10 reps;Left;Seated Hip Flexion/Marching: AROM;Seated;10 reps;Left      Assessment/Plan    PT Assessment Patient needs continued PT services  PT Diagnosis Difficulty walking;Generalized weakness   PT Problem List Decreased strength;Decreased activity tolerance;Decreased mobility;Decreased balance  PT Treatment Interventions DME instruction;Gait training;Therapeutic activities;Therapeutic exercise;Balance training;Functional mobility training;Patient/family education   PT Goals (Current goals can  be found in the Care Plan section) Acute Rehab PT Goals Patient Stated Goal: return home with wife PT Goal Formulation: With patient Time For Goal Achievement: 09/03/14 Potential to Achieve Goals: Good    Frequency Min 3X/week   Barriers to discharge Decreased caregiver support      Co-evaluation               End of Session   Activity Tolerance: Patient tolerated treatment well Patient left: in chair;with call bell/phone within reach Nurse Communication: Mobility status;Precautions         Time: 1130-1147 PT Time Calculation (min) (ACUTE ONLY): 17 min   Charges:   PT Evaluation $Initial PT Evaluation Tier I: 1 Procedure PT Treatments $Therapeutic Activity: 8-22 mins   PT G CodesMelford Aase 08/20/2014, 12:39 PM [name], [name]

## 2014-08-20 NOTE — Discharge Instructions (Signed)
Follow with Primary MD [name], MD in 7 days  Please hold your warfarin for next 3 day, and recheck your INR level in next hemodialysis, and have them resume your warfarin if INR level is below 3 .  Get CBC, CMP, 2 view Chest X ray checked  by Primary MD next visit.    Activity: As tolerated with Full fall precautions use walker/cane & assistance as needed   Disposition Home , with home PT.   Diet: renal Diet , with feeding assistance and aspiration precautions as needed.  For Heart failure patients - Check your Weight same time everyday, if you gain over 2 pounds, or you develop in leg swelling, experience more shortness of breath or chest pain, call your Primary MD immediately. Follow Cardiac Low Salt Diet and 1.8 lit/day fluid restriction.   On your next visit with your primary care physician please Get Medicines reviewed and adjusted.   Please request your Prim.MD to go over all Hospital Tests and Procedure/Radiological results at the follow up, please get all Hospital records sent to your Prim MD by signing hospital release before you go home.   If you experience worsening of your admission symptoms, develop shortness of breath, life threatening emergency, suicidal or homicidal thoughts you must seek medical attention immediately by calling 911 or calling your MD immediately  if symptoms less severe.  You Must read complete instructions/literature along with all the possible adverse reactions/side effects for all the Medicines you take and that have been prescribed to you. Take any new Medicines after you have completely understood and accpet all the possible adverse reactions/side effects.   Do not drive, operating heavy machinery, perform activities at heights, swimming or participation in water activities or provide baby sitting services if your were admitted for syncope or siezures until you have seen by Primary MD or a Neurologist and advised to do so again.  Do not  drive when taking Pain medications.    Do not take more than prescribed Pain, Sleep and Anxiety Medications  Special Instructions: If you have smoked or chewed Tobacco  in the last 2 yrs please stop smoking, stop any regular Alcohol  and or any Recreational drug use.  Wear Seat belts while driving.   Please note  You were cared for by a hospitalist during your hospital stay. If you have any questions about your discharge medications or the care you received while you were in the hospital after you are discharged, you can call the unit and asked to speak with the hospitalist on call if the hospitalist that took care of you is not available. Once you are discharged, your primary care physician will handle any further medical issues. Please note that NO REFILLS for any discharge medications will be authorized once you are discharged, as it is imperative that you return to your primary care physician (or establish a relationship with a primary care physician if you do not have one) for your aftercare needs so that they can reassess your need for medications and monitor your lab values.

## 2014-08-25 ENCOUNTER — Ambulatory Visit (INDEPENDENT_AMBULATORY_CARE_PROVIDER_SITE_OTHER): Payer: Self-pay | Admitting: Orthopedic Surgery

## 2014-08-25 ENCOUNTER — Other Ambulatory Visit: Payer: Self-pay | Admitting: Cardiology

## 2014-08-25 ENCOUNTER — Ambulatory Visit (INDEPENDENT_AMBULATORY_CARE_PROVIDER_SITE_OTHER): Payer: Medicare Other

## 2014-08-25 VITALS — BP 103/67 | Ht 76.0 in | Wt 196.0 lb

## 2014-08-25 DIAGNOSIS — S82892A Other fracture of left lower leg, initial encounter for closed fracture: Secondary | ICD-10-CM

## 2014-08-25 NOTE — Patient Instructions (Signed)
Weightbearing as tolerated in the brace until next visit

## 2014-08-25 NOTE — Progress Notes (Signed)
Chief Complaint  Patient presents with  . Follow-up    follow up and xray left ankle, DOI 07/20/14    Fracture care left ankle this is day 36 for nondisplaced left distal fibular fracture treated with Cam Walker  X-rays today show fracture stable mortise intact  He has no tenderness at his fracture site however he does have 3 pressure sores grade 1 at the heel and at the metatarsal head #1 and #3  Therefore I'm going to place him back in his regular shoes I've asked his wife to check daily if problems let me know  X-rays again on January 21 and he should be able to be weightbearing as tolerated until then.

## 2014-08-31 ENCOUNTER — Encounter: Payer: Self-pay | Admitting: Cardiology

## 2014-08-31 ENCOUNTER — Ambulatory Visit (INDEPENDENT_AMBULATORY_CARE_PROVIDER_SITE_OTHER): Payer: Medicare Other | Admitting: Cardiology

## 2014-08-31 VITALS — BP 115/67 | HR 65 | Ht 76.0 in | Wt 205.0 lb

## 2014-08-31 DIAGNOSIS — I429 Cardiomyopathy, unspecified: Secondary | ICD-10-CM

## 2014-08-31 DIAGNOSIS — Z952 Presence of prosthetic heart valve: Secondary | ICD-10-CM

## 2014-08-31 DIAGNOSIS — J948 Other specified pleural conditions: Secondary | ICD-10-CM

## 2014-08-31 DIAGNOSIS — J90 Pleural effusion, not elsewhere classified: Secondary | ICD-10-CM

## 2014-08-31 DIAGNOSIS — I480 Paroxysmal atrial fibrillation: Secondary | ICD-10-CM

## 2014-08-31 DIAGNOSIS — Z954 Presence of other heart-valve replacement: Secondary | ICD-10-CM

## 2014-08-31 MED ORDER — AMIODARONE HCL 200 MG PO TABS: 200.0000 mg | ORAL_TABLET | Freq: Every day | ORAL | Status: AC

## 2014-08-31 NOTE — Patient Instructions (Signed)
   Decrease Amiodarone to 200mg  daily in about 2 weeks Continue all other medications.   Follow up in  6 weeks

## 2014-08-31 NOTE — Assessment & Plan Note (Signed)
Suspected nonischemic with global hypokinesis and severe LV dysfunction, although has known ischemic heart disease as well. Plan is to continue medical therapy as already outlined. No plan for ICD at this time.

## 2014-08-31 NOTE — Assessment & Plan Note (Signed)
Paroxysmal, currently in sinus rhythm. Amiodarone dose was recently increased during hospital stay. Continue amiodarone 400 mg daily for the next 2 weeks, then reduce to 200 mg daily. I reviewed the discharge summary, Coumadin was held for a few days due to supratherapeutic INR with plan to resume and have it rechecked at hemodialysis. Nursing is checking on current dose, and also who is to follow Coumadin going forward. Not checked in our office since August.

## 2014-08-31 NOTE — Progress Notes (Signed)
Reason for visit: Hospital follow-up, cardiomyopathy, PAF, valvular heart disease  Clinical Summary Mr. Stracener is a medically complex 64 y.o.male last seen in August 2015. Record review finds recent hospitalization in December with pneumonia and a loculated pleural effusion. He underwent thoracentesis with finding of exudative pleural effusion, culture positive MRSA. Treated with vancomycin per ID. Hospital stay also complicated by recurrent rapid atrial fibrillation ultimately requiring cardioversion due to persistence. He was placed on amiodarone at 400 mg daily at discharge (previously on 100 mg daily).  He is here with his wife. Still weak following recent illness, appetite is fair. He reports tolerating hemodialysis without hypotension, and no significant palpitations or chest pain.  Recent lab work in December showed AST 34, ALT 24, and TSH 1.2.  Followup echocardiogram in April revealed mildly dilated LV with mild LVH, LVEF 95-62%, grade 3 diastolic dysfunction, minimal aortic stenosis with mean gradient 7 mm mercury, grossly normal bioprosthetic mitral valve function with trivial mitral regurgitation, severe left atrial enlargement, moderately reduced RV contraction. I did talk with him about the possibility of a defibrillator, however his risk for infection would be increased particularly with ESRD, and decision was made to hold off on this. He did have a follow-up TEE during his recent hospitalization with similar LVEF.  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 Servando Snare in May of 2008. He has had paroxysmal atrial fibrillation, continues on Coumadin and amiodarone.  ECG from December 23 showed sinus bradycardia with right bundle-branch block and PVC.   Allergies  Allergen Reactions  . Bacitracin Other  (See Comments)    unknown  . Norvasc [Amlodipine Besylate] Other (See Comments)    unknown    Current Outpatient Prescriptions  Medication Sig Dispense Refill  . albuterol (PROVENTIL) (2.5 MG/3ML) 0.083% nebulizer solution Take 3 mLs (2.5 mg total) by nebulization every 2 (two) hours as needed for wheezing. 75 mL 12  . allopurinol (ZYLOPRIM) 100 MG tablet Take 100 mg by mouth daily.     Marland Kitchen amiodarone (PACERONE) 200 MG tablet Take 1 tablet (200 mg total) by mouth daily.    Marland Kitchen atorvastatin (LIPITOR) 20 MG tablet TAKE 1 TABLET IN THE EVENING. 30 tablet 0  . B Complex-C-Zn-Folic Acid (DIALYVITE/ZINC PO) Take by mouth 3 (three) times a week. Patient has dialysis on Tuesdays, Thursdays, and Saturdays    . folic acid (FOLVITE) 1 MG tablet Take 1 mg by mouth daily.      Marland Kitchen HYDROcodone-acetaminophen (NORCO/VICODIN) 5-325 MG per tablet Take 1 tablet by mouth every 6 (six) hours as needed for moderate pain. 100 tablet 0  . insulin glargine (LANTUS) 100 UNIT/ML injection Inject 10 Units into the skin at bedtime.    . metoprolol (LOPRESSOR) 50 MG tablet Take 75 mg by mouth 2 (two) times daily.    . midodrine (PROAMATINE) 10 MG tablet Take 1 tablet (10 mg total) by mouth 2 (two) times daily with a meal.    . pantoprazole (PROTONIX) 40 MG tablet Take 40 mg by mouth 2 (two) times daily.     Marland Kitchen RENVELA 800 MG tablet Take 800 mg by mouth 3 (three) times daily with meals.     . SENSIPAR 30 MG tablet Take 30 mg by mouth daily.    . traMADol (ULTRAM) 50 MG tablet Take by mouth every 8 (eight) hours  as needed for moderate pain.    . traZODone (DESYREL) 50 MG tablet Take 1 tablet (50 mg total) by mouth at bedtime. 30 tablet 0   No current facility-administered medications for this visit.    Past Medical History  Diagnosis Date  . UGI bleed 02/12/11    On anticoagulation; EGD w/snare polypectomy-multiple polypoid lesions antrum(benign), Chronic active gastritis (NEGATIVE H pylori)  . Anemia, chronic disease   .  Sepsis due to enterococcus 02/11/11  . Atrial fibrillation     On coumadin  . Coronary atherosclerosis of native coronary artery     Multivessel status post CABG  . Cardiomyopathy   . Type 2 diabetes mellitus   . Essential hypertension, benign   . Hyperlipemia   . Gout   . S/P patent foramen ovale closure   . Hyperbilirubinemia 08/10/2013  . SBO (small bowel obstruction) 01/2014  . 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 Clostridium difficile colitis 01/2014  . Diverticulosis   . Internal hemorrhoids   . ESRD on hemodialysis     Started 2008-2009. Dr Hinda Lenis on a TTS schedule - R forearm AVF for several years     Social History Mr. Kimberlin reports that he quit smoking about 7 years ago. His smoking use included Cigarettes. He started smoking about 45 years ago. He has a 8 pack-year smoking history. He has never used smokeless tobacco. Mr. Podgorski reports that he does not drink alcohol.  Review of Systems Complete review of systems negative except as otherwise outlined in the clinical summary and also the following.  Physical Examination Filed Vitals:   08/31/14 1618  BP: 115/67  Pulse: 65   Filed Weights   08/31/14 1618  Weight: 205 lb (92.987 kg)   Appears comfortable at rest, seated in wheelchair.  HEENT: Conjunctiva and lids normal, oropharynx clear.  Neck: Supple, no elevated JVP, no thyromegaly.  Lungs: Clear to auscultation with reduced breath sounds on the right base, nonlabored breathing at rest.  Cardiac: Regular rate and rhythm, S4, soft systolic murmur, no pericardial rub.  Abdomen: Soft, nontender, bowel sounds present.  Extremities: No pitting edema, status post right AKA. Dialysis fistula in the right arm.  Skin: Warm and dry.  Musculoskeletal: No kyphosis.  Neuropsychiatric: Alert and oriented x3, affect grossly appropriate.   Problem List and Plan   Atrial fibrillation Paroxysmal, currently in sinus rhythm. Amiodarone dose was recently increased  during hospital stay. Continue amiodarone 400 mg daily for the next 2 weeks, then reduce to 200 mg daily. I reviewed the discharge summary, Coumadin was held for a few days due to supratherapeutic INR with plan to resume and have it rechecked at hemodialysis. Nursing is checking on current dose, and also who is to follow Coumadin going forward. Not checked in our office since August.  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 Plan is to continue medical therapy as already outlined. No plan for ICD at this time.  Pleural effusion, right Exudative, culture positive MRSA. Patient is status post thoracentesis, continuing on IV vancomycin during hemodialysis per ID recommendations. He follows up with Dr. Gerarda Fraction later this week.  H/O mitral valve replacement Status post bioprosthetic MVR as outlined, stable.    Satira Sark, M.D., F.A.C.C.

## 2014-08-31 NOTE — Assessment & Plan Note (Signed)
Exudative, culture positive MRSA. Patient is status post thoracentesis, continuing on IV vancomycin during hemodialysis per ID recommendations. He follows up with Dr. Gerarda Fraction later this week.

## 2014-08-31 NOTE — Assessment & Plan Note (Signed)
Status post bioprosthetic MVR as outlined, stable.

## 2014-09-08 ENCOUNTER — Ambulatory Visit (INDEPENDENT_AMBULATORY_CARE_PROVIDER_SITE_OTHER): Payer: Medicare Other | Admitting: *Deleted

## 2014-09-08 DIAGNOSIS — Z952 Presence of prosthetic heart valve: Secondary | ICD-10-CM

## 2014-09-08 DIAGNOSIS — Z954 Presence of other heart-valve replacement: Secondary | ICD-10-CM

## 2014-09-08 DIAGNOSIS — I4891 Unspecified atrial fibrillation: Secondary | ICD-10-CM

## 2014-09-08 DIAGNOSIS — Z7901 Long term (current) use of anticoagulants: Secondary | ICD-10-CM

## 2014-09-08 LAB — POCT INR
INR: 5.6
INR: 5.6

## 2014-09-08 MED ORDER — WARFARIN SODIUM 1 MG PO TABS: ORAL_TABLET | ORAL | Status: DC

## 2014-09-09 ENCOUNTER — Encounter (HOSPITAL_COMMUNITY): Payer: Self-pay | Admitting: *Deleted

## 2014-09-09 ENCOUNTER — Emergency Department (HOSPITAL_COMMUNITY): Payer: Medicare Other

## 2014-09-09 ENCOUNTER — Emergency Department (HOSPITAL_COMMUNITY)
Admission: EM | Admit: 2014-09-09 | Discharge: 2014-09-09 | Disposition: A | Discharge status: Home / Self Care | Payer: Medicare Other | Attending: Emergency Medicine | Admitting: Emergency Medicine

## 2014-09-09 DIAGNOSIS — Z794 Long term (current) use of insulin: Secondary | ICD-10-CM | POA: Insufficient documentation

## 2014-09-09 DIAGNOSIS — E785 Hyperlipidemia, unspecified: Secondary | ICD-10-CM | POA: Diagnosis not present

## 2014-09-09 DIAGNOSIS — Z8719 Personal history of other diseases of the digestive system: Secondary | ICD-10-CM | POA: Diagnosis not present

## 2014-09-09 DIAGNOSIS — I120 Hypertensive chronic kidney disease with stage 5 chronic kidney disease or end stage renal disease: Secondary | ICD-10-CM | POA: Diagnosis not present

## 2014-09-09 DIAGNOSIS — Z79899 Other long term (current) drug therapy: Secondary | ICD-10-CM | POA: Diagnosis not present

## 2014-09-09 DIAGNOSIS — L0231 Cutaneous abscess of buttock: Secondary | ICD-10-CM | POA: Insufficient documentation

## 2014-09-09 DIAGNOSIS — Z8673 Personal history of transient ischemic attack (TIA), and cerebral infarction without residual deficits: Secondary | ICD-10-CM | POA: Insufficient documentation

## 2014-09-09 DIAGNOSIS — I2510 Atherosclerotic heart disease of native coronary artery without angina pectoris: Secondary | ICD-10-CM | POA: Insufficient documentation

## 2014-09-09 DIAGNOSIS — Z951 Presence of aortocoronary bypass graft: Secondary | ICD-10-CM | POA: Diagnosis not present

## 2014-09-09 DIAGNOSIS — N186 End stage renal disease: Secondary | ICD-10-CM | POA: Diagnosis not present

## 2014-09-09 DIAGNOSIS — Z87891 Personal history of nicotine dependence: Secondary | ICD-10-CM | POA: Diagnosis not present

## 2014-09-09 DIAGNOSIS — L0291 Cutaneous abscess, unspecified: Secondary | ICD-10-CM

## 2014-09-09 DIAGNOSIS — Z9889 Other specified postprocedural states: Secondary | ICD-10-CM | POA: Insufficient documentation

## 2014-09-09 DIAGNOSIS — Z992 Dependence on renal dialysis: Secondary | ICD-10-CM | POA: Insufficient documentation

## 2014-09-09 DIAGNOSIS — Z8619 Personal history of other infectious and parasitic diseases: Secondary | ICD-10-CM | POA: Diagnosis not present

## 2014-09-09 DIAGNOSIS — E119 Type 2 diabetes mellitus without complications: Secondary | ICD-10-CM | POA: Diagnosis not present

## 2014-09-09 DIAGNOSIS — R791 Abnormal coagulation profile: Secondary | ICD-10-CM | POA: Insufficient documentation

## 2014-09-09 DIAGNOSIS — D649 Anemia, unspecified: Secondary | ICD-10-CM | POA: Diagnosis not present

## 2014-09-09 DIAGNOSIS — M109 Gout, unspecified: Secondary | ICD-10-CM | POA: Diagnosis not present

## 2014-09-09 DIAGNOSIS — N189 Chronic kidney disease, unspecified: Secondary | ICD-10-CM

## 2014-09-09 LAB — CBC WITH DIFFERENTIAL/PLATELET
BASOS ABS: 0 10*3/uL (ref 0.0–0.1)
BASOS PCT: 0 % (ref 0–1)
Eosinophils Absolute: 0.1 10*3/uL (ref 0.0–0.7)
Eosinophils Relative: 1 % (ref 0–5)
HCT: 24.8 % — ABNORMAL LOW (ref 39.0–52.0)
Hemoglobin: 7.7 g/dL — ABNORMAL LOW (ref 13.0–17.0)
LYMPHS PCT: 9 % — AB (ref 12–46)
Lymphs Abs: 1.1 10*3/uL (ref 0.7–4.0)
MCH: 29.1 pg (ref 26.0–34.0)
MCHC: 31 g/dL (ref 30.0–36.0)
MCV: 93.6 fL (ref 78.0–100.0)
MONO ABS: 0.9 10*3/uL (ref 0.1–1.0)
MONOS PCT: 7 % (ref 3–12)
NEUTROS PCT: 83 % — AB (ref 43–77)
Neutro Abs: 10.1 10*3/uL — ABNORMAL HIGH (ref 1.7–7.7)
Platelets: 355 10*3/uL (ref 150–400)
RBC: 2.65 MIL/uL — ABNORMAL LOW (ref 4.22–5.81)
RDW: 19.4 % — ABNORMAL HIGH (ref 11.5–15.5)
WBC: 12.3 10*3/uL — ABNORMAL HIGH (ref 4.0–10.5)

## 2014-09-09 LAB — BASIC METABOLIC PANEL
Anion gap: 11 (ref 5–15)
BUN: 34 mg/dL — AB (ref 6–23)
CHLORIDE: 100 meq/L (ref 96–112)
CO2: 28 mmol/L (ref 19–32)
Calcium: 8 mg/dL — ABNORMAL LOW (ref 8.4–10.5)
Creatinine, Ser: 6.24 mg/dL — ABNORMAL HIGH (ref 0.50–1.35)
GFR, EST AFRICAN AMERICAN: 10 mL/min — AB (ref >90)
GFR, EST NON AFRICAN AMERICAN: 8 mL/min — AB (ref >90)
GLUCOSE: 183 mg/dL — AB (ref 70–99)
Potassium: 4.3 mmol/L (ref 3.5–5.1)
Sodium: 139 mmol/L (ref 135–145)

## 2014-09-09 LAB — PROTIME-INR
INR: 4.74 — ABNORMAL HIGH (ref 0.00–1.49)
PROTHROMBIN TIME: 44.9 s — AB (ref 11.6–15.2)

## 2014-09-09 LAB — CBG MONITORING, ED: GLUCOSE-CAPILLARY: 192 mg/dL — AB (ref 70–99)

## 2014-09-09 MED ORDER — AMOXICILLIN-POT CLAVULANATE 875-125 MG PO TABS: 1.0000 | ORAL_TABLET | Freq: Two times a day (BID) | ORAL | Status: DC

## 2014-09-09 MED ORDER — SODIUM CHLORIDE 0.9 % IV SOLN
3.0000 g | Freq: Once | INTRAVENOUS | Status: AC
  Administered 2014-09-09: 3 g via INTRAVENOUS
  Filled 2014-09-09: qty 3

## 2014-09-09 MED ORDER — IOHEXOL 300 MG/ML  SOLN
100.0000 mL | Freq: Once | INTRAMUSCULAR | Status: AC | PRN
  Administered 2014-09-09: 100 mL via INTRAVENOUS

## 2014-09-09 MED ORDER — AMPICILLIN-SULBACTAM SODIUM 3 (2-1) G IV SOLR: 3.0000 g | Freq: Once | INTRAVENOUS | Status: DC

## 2014-09-09 NOTE — ED Notes (Signed)
Pt states boil to buttock, states draining to area. Sore to left foot, which pt states is healing. Non-healing sore to right 5th digit x 1 mo. States sensation is diminished to tip of finger, stating all the fingers are numb.

## 2014-09-09 NOTE — ED Notes (Signed)
CBG 192 

## 2014-09-09 NOTE — ED Provider Notes (Signed)
CSN: [id number]     Arrival date & time 09/09/14  1116 History  This chart was scribed for [name] Cable, MD by Edison Simon, ED Scribe. This patient was seen in room APA12/APA12 and the patient's care was started at 12:07 PM.    Chief Complaint  Patient presents with  . Abscess   Patient is a 64 y.o. male presenting with abscess. The history is provided by the patient. No language interpreter was used.  Abscess Location:  Ano-genital Ano-genital abscess location:  Perineum Abscess quality: draining   Red streaking: no   Duration:  1 month Progression:  Worsening Chronicity:  New Context: diabetes   Relieved by:  None tried Worsened by:  Nothing tried Ineffective treatments:  None tried Associated symptoms: no fever and no vomiting     HPI Comments: [name] [name] is a 64 y.o. male dialysis patient for 7 years who presents to the Emergency Department complaining of abscess near his buttocks that increased gradually in size since it appeared 1 month ago. He denies any pain. He states he is diabetic and his sugars have been a little high lately. He has dialysis on Tuesdays and Thursdays, last done yesterday. He states he is given antibiotics at dialysis. He states he uses Coumadin. He denies fever, vomiting, chest pain, abdominal pain, or cough.  Past Medical History  Diagnosis Date  . UGI bleed 02/12/11    On anticoagulation; EGD w/snare polypectomy-multiple polypoid lesions antrum(benign), Chronic active gastritis (NEGATIVE H pylori)  . Anemia, chronic disease   . Sepsis due to enterococcus 02/11/11  . Atrial fibrillation     On coumadin  . Coronary atherosclerosis of native coronary artery     Multivessel status post CABG  . Cardiomyopathy   . Type 2 diabetes mellitus   . Essential hypertension, benign   . Hyperlipemia   . Gout   . S/P patent foramen ovale closure   . Hyperbilirubinemia 08/10/2013  . SBO (small bowel obstruction) 01/2014  . Pneumatosis coli 01/2014    Cecum   . Clostridium difficile colitis 01/2014  . Diverticulosis   . Internal hemorrhoids   . ESRD on hemodialysis     Started 2008-2009. Dr Hinda Lenis on a TTS schedule - R forearm AVF for several years     Past Surgical History  Procedure Laterality Date  . Mitral valve replacement  01/05/2007    Bioprosthetic 12mm Edwards precordial tissue (Dr. Servando Snare)  . Coronary artery bypass graft  01/05/2007    LIMA-LAD, SVG-OM, seq. SVG-PDA, PLA-RCA  . Colonoscopy  06/23/2011    Dr. Oneida Alar: multiple sessile and pedunculated polyps, moderate diverticulosis, internal hemorrhoids, +ADENOMATOUS POLYPS, consider genetic testing, surveillance in October 2015   . Esophagogastroduodenoscopy  02/12/2011    CHRONIC ACTIVE GASTRITIS, NO H. pylori  . Transthoracic echocardiogram  11/2011    EF 25% w/ mod dilatation of LV & mod LVH; LA severely dilated; mod-severe dilation of RV with mod-severe decrease in RV function; mild MR & TR  . Cardioversion  04/19/2007    Dr. Marella Chimes  . Transthoracic echocardiogram  02/14/2011    EF 40-45%, mod conc LVH; ventricular septum with motion showing abnormal function, dyssynergy, paradox; mild AS; mild-mod MR stenosis; LA severely dilated; aptrial septum bowed from L to R with increased LA pressure   . Amputation Right 07/14/2013    Procedure: AMPUTATION DIGIT;  Surgeon: [name], MD;  Location: [facility];  Service: General;  Laterality: Right;  . Amputation Right 08/08/2013  Procedure: AMPUTATION BELOW KNEE;  Surgeon: [name], MD;  Location: [facility];  Service: General;  Laterality: Right;  . Stump revision Right 09/19/2013    Procedure: STUMP REVISION;  Surgeon: Scherry Ran, MD;  Location: [facility];  Service: General;  Laterality: Right;  . Amputation Right 10/17/2013    Procedure: AMPUTATION ABOVE KNEE;  Surgeon: [name], MD;  Location: [facility];  Service: General;  Laterality: Right;  . Colonoscopy N/A 04/10/2014    Procedure: COLONOSCOPY;  Surgeon: [name], MD;  Location: [facility];  Service: Endoscopy;  Laterality: N/A;  830  . Tee without cardioversion N/A 08/19/2014    Procedure: TRANSESOPHAGEAL ECHOCARDIOGRAM (TEE);  Surgeon: [name], MD;  Location: [facility] ENDOSCOPY;  Service: Cardiovascular;  Laterality: N/A;  . Cardioversion N/A 08/19/2014    Procedure: CARDIOVERSION;  Surgeon: [name], MD;  Location: [facility] ENDOSCOPY;  Service: Cardiovascular;  Laterality: N/A;   Family History  Problem Relation Age of Onset  . Diabetes Mother   . Diabetes Father   . Diabetes Sister   . Colon cancer Neg Hx   . Colon polyps Neg Hx    History  Substance Use Topics  . Smoking status: Former Smoker -- 1.00 packs/day for 8 years    Types: Cigarettes    Start date: 04/27/1969    Quit date: 11/27/2006  . Smokeless tobacco: Never Used     Comment: quit about 5 yrs  . Alcohol Use: No    Review of Systems  Constitutional: Negative for fever.  Respiratory: Negative for cough.   Cardiovascular: Negative for chest pain.  Gastrointestinal: Negative for vomiting and abdominal pain.  Skin:       abscess  Hematological: Bruises/bleeds easily.  All other systems reviewed and are negative.     Allergies  Bacitracin and Norvasc  Home Medications   Prior to Admission medications   Medication Sig Start Date End Date Taking? Authorizing Provider  albuterol (PROVENTIL) (2.5 MG/3ML) 0.083% nebulizer solution Take 3 mLs (2.5 mg total) by nebulization every 2 (two) hours as needed for wheezing. 02/21/14   Kinnie Feil, MD  allopurinol (ZYLOPRIM) 100 MG tablet Take 100 mg by mouth daily.  05/15/11   Historical Provider, MD  amiodarone (PACERONE) 200 MG tablet Take 1 tablet (200 mg total) by mouth daily. 08/31/14   Satira Sark, MD  atorvastatin (LIPITOR) 20 MG tablet TAKE 1 TABLET IN THE EVENING. 08/25/14   Satira Sark, MD  B Complex-C-Zn-Folic Acid (DIALYVITE/ZINC PO) Take by mouth 3 (three) times a week. Patient has  dialysis on Tuesdays, Thursdays, and Saturdays    Historical Provider, MD  folic acid (FOLVITE) 1 MG tablet Take 1 mg by mouth daily.      Historical Provider, MD  HYDROcodone-acetaminophen (NORCO/VICODIN) 5-325 MG per tablet Take 1 tablet by mouth every 6 (six) hours as needed for moderate pain. 08/06/14   Carole Civil, MD  insulin glargine (LANTUS) 100 UNIT/ML injection Inject 10 Units into the skin at bedtime.    Historical Provider, MD  metoprolol (LOPRESSOR) 50 MG tablet Take 75 mg by mouth 2 (two) times daily. 07/04/14   Historical Provider, MD  midodrine (PROAMATINE) 10 MG tablet Take 1 tablet (10 mg total) by mouth 2 (two) times daily with a meal. 08/20/14   Phillips Climes, MD  pantoprazole (PROTONIX) 40 MG tablet Take 40 mg by mouth 2 (two) times daily.  05/25/11   Historical Provider, MD  RENVELA  800 MG tablet Take 800 mg by mouth 3 (three) times daily with meals.  05/03/11   Historical Provider, MD  SENSIPAR 30 MG tablet Take 30 mg by mouth daily. 01/22/14   Historical Provider, MD  traMADol (ULTRAM) 50 MG tablet Take by mouth every 8 (eight) hours as needed for moderate pain.    Historical Provider, MD  traZODone (DESYREL) 50 MG tablet Take 1 tablet (50 mg total) by mouth at bedtime. 08/13/13   Nita Sells, MD  warfarin (COUMADIN) 1 MG tablet Take 1/2 tablet daily 09/08/14   Satira Sark, MD   BP 136/65 mmHg  Pulse 68  Temp(Src) 98.1 F (36.7 C) (Oral)  Resp 18  Ht 6\' 4"  (1.93 m)  Wt 205 lb (92.987 kg)  BMI 24.96 kg/m2  SpO2 98% Physical Exam  Nursing note and vitals reviewed.   CONSTITUTIONAL: Well developed/well nourished HEAD: Normocephalic/atraumatic EYES: EOMI/PERRL ENMT: Mucous membranes moist NECK: supple no meningeal signs SPINE/BACK:entire spine nontender CV: S1/S2 noted, no murmurs/rubs/gallops noted LUNGS: Lungs are clear to auscultation bilaterally, no apparent distress ABDOMEN: soft, nontender, no rebound or guarding, bowel sounds noted  throughout abdomen GU:no cva tenderness Rectal: draining abscess noted in perineum, no crepitus, no scrotal tenderness NEURO: Pt is awake/alert/appropriate, moves all extremitiesx4.  No facial droop.   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    COORDINATION OF CARE: 12:18 PM Discussed treatment plan with patient at beside, the patient agrees with the plan and has no further questions at this time. 2:22 PM Discussed imaging with dr Arnoldo Morale Recommends continuing augmentin He can see in office next week or he can see his PCP Wound is actively draining at this time Pt is well appearing and nontoxic at this time Per records in [facility] he has already been given instructions for his coumadin dosing   Labs Review Labs Reviewed  BASIC METABOLIC PANEL - Abnormal; Notable for the following:    Glucose, Bld 183 (*)    BUN 34 (*)    Creatinine, Ser 6.24 (*)    Calcium 8.0 (*)    GFR calc non Af Amer 8 (*)    GFR calc Af Amer 10 (*)    All other components within normal limits  CBC WITH DIFFERENTIAL - Abnormal; Notable for the following:    WBC 12.3 (*)    RBC 2.65 (*)    Hemoglobin 7.7 (*)    HCT 24.8 (*)    RDW 19.4 (*)    Neutrophils Relative % 83 (*)    Neutro Abs 10.1 (*)    Lymphocytes Relative 9 (*)    All other components within normal limits  PROTIME-INR - Abnormal; Notable for the following:    Prothrombin Time 44.9 (*)    INR 4.74 (*)    All other components within normal limits  CBG MONITORING, ED - Abnormal; Notable for the following:    Glucose-Capillary  192 (*)    All other components within normal limits     Medications  Ampicillin-Sulbactam (UNASYN) 3 g in sodium chloride 0.9 % 100 mL IVPB (0 g Intravenous Stopped 09/09/14 1347)  iohexol (  OMNIPAQUE) 300 MG/ML solution 100 mL (100 mLs Intravenous Contrast Given 09/09/14 1309)    MDM   Final diagnoses:  Abscess  Chronic renal failure, unspecified stage  Left buttock abscess  Supratherapeutic INR    Nursing notes including past medical history and social history reviewed and considered in documentation Labs/vital reviewed myself and considered during evaluation    I personally performed the services described in this documentation, which was scribed in my presence. The recorded information has been reviewed and is accurate.      [name] Cable, MD 09/09/14 [phone]

## 2014-09-09 NOTE — Discharge Instructions (Signed)

## 2014-09-15 ENCOUNTER — Ambulatory Visit (INDEPENDENT_AMBULATORY_CARE_PROVIDER_SITE_OTHER): Payer: Medicare Other | Admitting: *Deleted

## 2014-09-15 DIAGNOSIS — Z954 Presence of other heart-valve replacement: Secondary | ICD-10-CM

## 2014-09-15 DIAGNOSIS — Z952 Presence of prosthetic heart valve: Secondary | ICD-10-CM

## 2014-09-15 DIAGNOSIS — I4891 Unspecified atrial fibrillation: Secondary | ICD-10-CM

## 2014-09-15 DIAGNOSIS — Z7901 Long term (current) use of anticoagulants: Secondary | ICD-10-CM

## 2014-09-15 LAB — POCT INR: INR: 2.2

## 2014-09-17 ENCOUNTER — Encounter: Payer: Self-pay | Admitting: Orthopedic Surgery

## 2014-09-17 ENCOUNTER — Ambulatory Visit (INDEPENDENT_AMBULATORY_CARE_PROVIDER_SITE_OTHER): Payer: Self-pay | Admitting: Orthopedic Surgery

## 2014-09-17 ENCOUNTER — Ambulatory Visit (INDEPENDENT_AMBULATORY_CARE_PROVIDER_SITE_OTHER): Payer: Medicare Other

## 2014-09-17 VITALS — BP 91/58 | Ht 76.0 in | Wt 205.0 lb

## 2014-09-17 DIAGNOSIS — S82892A Other fracture of left lower leg, initial encounter for closed fracture: Secondary | ICD-10-CM

## 2014-09-17 NOTE — Progress Notes (Signed)
Patient ID: [name] [name], male   DOB: 15-Jun-1951, 64 y.o.   MRN: [id number] Chief Complaint  Patient presents with  . Follow-up    3 week recheck + xray Left ankle fx, DOI 07/20/14    8 weeks post closed treatment for lateral malleolus fracture  Patient has developed 3 ulcerations in the foot 1 near the heel superficial 1 near the second and 1 near the first metatarsals also superficial ulcers  These are dressed sterilely  These will be followed by podiatry  I've also ordered diabetic shoes  Far as the x-ray goes this fracture shows no change in position of the mortise  He is stable for release from care provided he follows up for the ulcer treatment

## 2014-09-17 NOTE — Patient Instructions (Signed)
WILL REFER TO DR Caprice Beaver FOR DIABETIC FOOT CARE

## 2014-09-21 ENCOUNTER — Other Ambulatory Visit: Payer: Self-pay | Admitting: Cardiology

## 2014-09-21 ENCOUNTER — Other Ambulatory Visit: Payer: Self-pay | Admitting: *Deleted

## 2014-09-21 ENCOUNTER — Telehealth: Payer: Self-pay | Admitting: *Deleted

## 2014-09-21 DIAGNOSIS — E13621 Other specified diabetes mellitus with foot ulcer: Secondary | ICD-10-CM

## 2014-09-21 DIAGNOSIS — L97529 Non-pressure chronic ulcer of other part of left foot with unspecified severity: Principal | ICD-10-CM

## 2014-09-21 NOTE — Telephone Encounter (Signed)
REFERRAL FAXED TO DR Caprice Beaver

## 2014-09-24 ENCOUNTER — Ambulatory Visit (INDEPENDENT_AMBULATORY_CARE_PROVIDER_SITE_OTHER): Payer: Medicare Other | Admitting: *Deleted

## 2014-09-24 DIAGNOSIS — Z7901 Long term (current) use of anticoagulants: Secondary | ICD-10-CM

## 2014-09-24 DIAGNOSIS — Z954 Presence of other heart-valve replacement: Secondary | ICD-10-CM

## 2014-09-24 DIAGNOSIS — Z952 Presence of prosthetic heart valve: Secondary | ICD-10-CM

## 2014-09-24 DIAGNOSIS — I4891 Unspecified atrial fibrillation: Secondary | ICD-10-CM

## 2014-09-24 LAB — POCT INR: INR: 1.8

## 2014-09-29 ENCOUNTER — Encounter (HOSPITAL_COMMUNITY): Payer: Self-pay | Admitting: Emergency Medicine

## 2014-09-29 ENCOUNTER — Emergency Department (HOSPITAL_COMMUNITY)
Admission: EM | Admit: 2014-09-29 | Discharge: 2014-09-29 | Disposition: A | Discharge status: Home / Self Care | Payer: Medicare Other | Attending: Emergency Medicine | Admitting: Emergency Medicine

## 2014-09-29 DIAGNOSIS — K610 Anal abscess: Secondary | ICD-10-CM | POA: Diagnosis present

## 2014-09-29 DIAGNOSIS — Z951 Presence of aortocoronary bypass graft: Secondary | ICD-10-CM | POA: Insufficient documentation

## 2014-09-29 DIAGNOSIS — Z8619 Personal history of other infectious and parasitic diseases: Secondary | ICD-10-CM | POA: Diagnosis not present

## 2014-09-29 DIAGNOSIS — Z9889 Other specified postprocedural states: Secondary | ICD-10-CM | POA: Diagnosis not present

## 2014-09-29 DIAGNOSIS — N186 End stage renal disease: Secondary | ICD-10-CM | POA: Diagnosis not present

## 2014-09-29 DIAGNOSIS — Z87891 Personal history of nicotine dependence: Secondary | ICD-10-CM | POA: Diagnosis not present

## 2014-09-29 DIAGNOSIS — M109 Gout, unspecified: Secondary | ICD-10-CM | POA: Diagnosis not present

## 2014-09-29 DIAGNOSIS — Z79899 Other long term (current) drug therapy: Secondary | ICD-10-CM | POA: Diagnosis not present

## 2014-09-29 DIAGNOSIS — L02215 Cutaneous abscess of perineum: Secondary | ICD-10-CM

## 2014-09-29 DIAGNOSIS — E785 Hyperlipidemia, unspecified: Secondary | ICD-10-CM | POA: Insufficient documentation

## 2014-09-29 DIAGNOSIS — Z992 Dependence on renal dialysis: Secondary | ICD-10-CM | POA: Insufficient documentation

## 2014-09-29 DIAGNOSIS — I2510 Atherosclerotic heart disease of native coronary artery without angina pectoris: Secondary | ICD-10-CM | POA: Insufficient documentation

## 2014-09-29 DIAGNOSIS — Z7982 Long term (current) use of aspirin: Secondary | ICD-10-CM | POA: Insufficient documentation

## 2014-09-29 DIAGNOSIS — I120 Hypertensive chronic kidney disease with stage 5 chronic kidney disease or end stage renal disease: Secondary | ICD-10-CM | POA: Diagnosis not present

## 2014-09-29 DIAGNOSIS — Z8673 Personal history of transient ischemic attack (TIA), and cerebral infarction without residual deficits: Secondary | ICD-10-CM | POA: Insufficient documentation

## 2014-09-29 DIAGNOSIS — Z862 Personal history of diseases of the blood and blood-forming organs and certain disorders involving the immune mechanism: Secondary | ICD-10-CM | POA: Insufficient documentation

## 2014-09-29 DIAGNOSIS — Z794 Long term (current) use of insulin: Secondary | ICD-10-CM | POA: Insufficient documentation

## 2014-09-29 DIAGNOSIS — E119 Type 2 diabetes mellitus without complications: Secondary | ICD-10-CM | POA: Insufficient documentation

## 2014-09-29 LAB — COMPREHENSIVE METABOLIC PANEL
ALK PHOS: 127 U/L — AB (ref 39–117)
ALT: 14 U/L (ref 0–53)
AST: 29 U/L (ref 0–37)
Albumin: 2.3 g/dL — ABNORMAL LOW (ref 3.5–5.2)
Anion gap: 11 (ref 5–15)
BILIRUBIN TOTAL: 1.7 mg/dL — AB (ref 0.3–1.2)
BUN: 18 mg/dL (ref 6–23)
CO2: 28 mmol/L (ref 19–32)
Calcium: 7.9 mg/dL — ABNORMAL LOW (ref 8.4–10.5)
Chloride: 99 mmol/L (ref 96–112)
Creatinine, Ser: 3.92 mg/dL — ABNORMAL HIGH (ref 0.50–1.35)
GFR calc non Af Amer: 15 mL/min — ABNORMAL LOW (ref >90)
GFR, EST AFRICAN AMERICAN: 17 mL/min — AB (ref >90)
Glucose, Bld: 224 mg/dL — ABNORMAL HIGH (ref 70–99)
Potassium: 3.8 mmol/L (ref 3.5–5.1)
Sodium: 138 mmol/L (ref 135–145)
TOTAL PROTEIN: 8.2 g/dL (ref 6.0–8.3)

## 2014-09-29 LAB — CBC WITH DIFFERENTIAL/PLATELET
BASOS ABS: 0 10*3/uL (ref 0.0–0.1)
Basophils Relative: 0 % (ref 0–1)
Eosinophils Absolute: 0.2 10*3/uL (ref 0.0–0.7)
Eosinophils Relative: 1 % (ref 0–5)
HEMATOCRIT: 33.8 % — AB (ref 39.0–52.0)
HEMOGLOBIN: 10.2 g/dL — AB (ref 13.0–17.0)
Lymphocytes Relative: 5 % — ABNORMAL LOW (ref 12–46)
Lymphs Abs: 0.8 10*3/uL (ref 0.7–4.0)
MCH: 28.7 pg (ref 26.0–34.0)
MCHC: 30.2 g/dL (ref 30.0–36.0)
MCV: 94.9 fL (ref 78.0–100.0)
MONOS PCT: 5 % (ref 3–12)
Monocytes Absolute: 0.9 10*3/uL (ref 0.1–1.0)
NEUTROS ABS: 14.3 10*3/uL — AB (ref 1.7–7.7)
NEUTROS PCT: 89 % — AB (ref 43–77)
Platelets: 308 10*3/uL (ref 150–400)
RBC: 3.56 MIL/uL — ABNORMAL LOW (ref 4.22–5.81)
RDW: 18.2 % — ABNORMAL HIGH (ref 11.5–15.5)
WBC: 16.2 10*3/uL — ABNORMAL HIGH (ref 4.0–10.5)

## 2014-09-29 LAB — CBG MONITORING, ED: Glucose-Capillary: 205 mg/dL — ABNORMAL HIGH (ref 70–99)

## 2014-09-29 MED ORDER — LIDOCAINE-EPINEPHRINE (PF) 2 %-1:200000 IJ SOLN
10.0000 mL | Freq: Once | INTRAMUSCULAR | Status: AC
  Administered 2014-09-29: 10 mL
  Filled 2014-09-29: qty 20

## 2014-09-29 NOTE — ED Notes (Signed)
Pt tolerating fluids well. 

## 2014-09-29 NOTE — ED Notes (Signed)
MD at bedside. 

## 2014-09-29 NOTE — ED Provider Notes (Signed)
CSN: [id number]     Arrival date & time 09/29/14  1112 History  This chart was scribed for Mirna Mires, MD by Lowella Petties, ED Scribe. The patient was seen in room APA04/APA04. Patient's care was started at 1:06 PM.   Chief Complaint  Patient presents with  . Abscess   The history is provided by the patient. No language interpreter was used.   HPI Comments: LUCAH PETTA is a 64 y.o. male with a history of DM who presents to the Emergency Department complaining of a burning, painful, abscess on the back of his scrotum which has become gradually worse over the past 2 weeks. He states that the pain is exacerbated by sitting, and he denies any drainage. He reports that he was recently treated here in the ED for an abscess in the same area, and that they told him to return if it came back. He reports that he followed up with Dr. Arnoldo Morale about this. He denies fever, diaphoresis, nausea, vomiting, diarrhea, and dysuria. He reports a history of dialysis which he receives MWF, and he states that he had this treatment today. He denies a history of MRSA.   Past Medical History  Diagnosis Date  . UGI bleed 02/12/11    On anticoagulation; EGD w/snare polypectomy-multiple polypoid lesions antrum(benign), Chronic active gastritis (NEGATIVE H pylori)  . Anemia, chronic disease   . Sepsis due to enterococcus 02/11/11  . Atrial fibrillation     On coumadin  . Coronary atherosclerosis of native coronary artery     Multivessel status post CABG  . Cardiomyopathy   . Type 2 diabetes mellitus   . Essential hypertension, benign   . Hyperlipemia   . Gout   . S/P patent foramen ovale closure   . Hyperbilirubinemia 08/10/2013  . SBO (small bowel obstruction) 01/2014  . 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 Clostridium difficile colitis 01/2014  . Diverticulosis   . Internal hemorrhoids   . ESRD on hemodialysis     Started 2008-2009. Dr Hinda Lenis on a TTS schedule - R forearm AVF for several years     Past  Surgical History  Procedure Laterality Date  . Mitral valve replacement  01/05/2007    Bioprosthetic 56mm Edwards precordial tissue (Dr. Servando Snare)  . Coronary artery bypass graft  01/05/2007    LIMA-LAD, SVG-OM, seq. SVG-PDA, PLA-RCA  . Colonoscopy  06/23/2011    Dr. Oneida Alar: multiple sessile and pedunculated polyps, moderate diverticulosis, internal hemorrhoids, +ADENOMATOUS POLYPS, consider genetic testing, surveillance in October 2015   . Esophagogastroduodenoscopy  02/12/2011    CHRONIC ACTIVE GASTRITIS, NO H. pylori  . Transthoracic echocardiogram  11/2011    EF 25% w/ mod dilatation of LV & mod LVH; LA severely dilated; mod-severe dilation of RV with mod-severe decrease in RV function; mild MR & TR  . Cardioversion  04/19/2007    Dr. Marella Chimes  . Transthoracic echocardiogram  02/14/2011    EF 40-45%, mod conc LVH; ventricular septum with motion showing abnormal function, dyssynergy, paradox; mild AS; mild-mod MR stenosis; LA severely dilated; aptrial septum bowed from L to R with increased LA pressure   . Amputation Right 07/14/2013    Procedure: AMPUTATION DIGIT;  Surgeon: [name], MD;  Location: [facility];  Service: General;  Laterality: Right;  . Amputation Right 08/08/2013    Procedure: AMPUTATION BELOW KNEE;  Surgeon: [name], MD;  Location: [facility];  Service: General;  Laterality: Right;  . Stump revision  Right 09/19/2013    Procedure: STUMP REVISION;  Surgeon: Scherry Ran, MD;  Location: [facility];  Service: General;  Laterality: Right;  . Amputation Right 10/17/2013    Procedure: AMPUTATION ABOVE KNEE;  Surgeon: [name], MD;  Location: [facility];  Service: General;  Laterality: Right;  . Colonoscopy N/A 04/10/2014    Procedure: COLONOSCOPY;  Surgeon: [name], MD;  Location: [facility];  Service: Endoscopy;  Laterality: N/A;  830  . Tee without cardioversion N/A 08/19/2014    Procedure: TRANSESOPHAGEAL ECHOCARDIOGRAM (TEE);  Surgeon: [name], MD;   Location: [facility] ENDOSCOPY;  Service: Cardiovascular;  Laterality: N/A;  . Cardioversion N/A 08/19/2014    Procedure: CARDIOVERSION;  Surgeon: [name], MD;  Location: [facility] ENDOSCOPY;  Service: Cardiovascular;  Laterality: N/A;   Family History  Problem Relation Age of Onset  . Diabetes Mother   . Diabetes Father   . Diabetes Sister   . Colon cancer Neg Hx   . Colon polyps Neg Hx    History  Substance Use Topics  . Smoking status: Former Smoker -- 1.00 packs/day for 8 years    Types: Cigarettes    Start date: 04/27/1969    Quit date: 11/27/2006  . Smokeless tobacco: Never Used     Comment: quit about 5 yrs  . Alcohol Use: No    Review of Systems  Constitutional: Positive for chills. Negative for fever and diaphoresis.  HENT: Negative for sore throat.   Gastrointestinal: Negative for nausea, vomiting, abdominal pain, diarrhea and abdominal distention.  Genitourinary: Negative for dysuria.  Musculoskeletal: Negative for back pain.  Skin: Positive for wound (abscess). Negative for rash.  Neurological: Negative for weakness, light-headedness and headaches.   A complete 10 system review of systems was obtained and all systems are negative except as noted in the HPI and PMH.   Allergies  Bacitracin and Norvasc  Home Medications   Prior to Admission medications   Medication Sig Start Date End Date Taking? Authorizing Provider  albuterol (PROVENTIL) (2.5 MG/3ML) 0.083% nebulizer solution Take 3 mLs (2.5 mg total) by nebulization every 2 (two) hours as needed for wheezing. 02/21/14  Yes Kinnie Feil, MD  allopurinol (ZYLOPRIM) 100 MG tablet Take 100 mg by mouth daily.  05/15/11  Yes Historical Provider, MD  amiodarone (PACERONE) 200 MG tablet Take 1 tablet (200 mg total) by mouth daily. 08/31/14  Yes Satira Sark, MD  aspirin EC 81 MG tablet Take 81 mg by mouth daily.   Yes Historical Provider, MD  atorvastatin (LIPITOR) 20 MG tablet TAKE 1 TABLET IN THE EVENING--NEED APPT  FOR FURTHER REFILLS. 09/21/14  Yes Satira Sark, MD  FLUoxetine (PROZAC) 20 MG capsule Take 20 mg by mouth daily. 09/21/14  Yes Historical Provider, MD  folic acid (FOLVITE) 1 MG tablet Take 1 mg by mouth daily.     Yes Historical Provider, MD  HYDROcodone-acetaminophen (NORCO/VICODIN) 5-325 MG per tablet Take 1 tablet by mouth every 6 (six) hours as needed for moderate pain. 08/06/14  Yes Carole Civil, MD  insulin glargine (LANTUS) 100 UNIT/ML injection Inject 10 Units into the skin at bedtime.   Yes Historical Provider, MD  metoprolol (LOPRESSOR) 50 MG tablet Take 75 mg by mouth 2 (two) times daily. 07/04/14  Yes Historical Provider, MD  midodrine (PROAMATINE) 10 MG tablet Take 1 tablet (10 mg total) by mouth 2 (two) times daily with a meal. 08/20/14  Yes Phillips Climes, MD  pantoprazole (PROTONIX)  40 MG tablet Take 40 mg by mouth 2 (two) times daily.  05/25/11  Yes Historical Provider, MD  RENVELA 800 MG tablet Take 800 mg by mouth 3 (three) times daily with meals.  05/03/11  Yes Historical Provider, MD  SENSIPAR 30 MG tablet Take 30 mg by mouth daily. 01/22/14  Yes Historical Provider, MD  traZODone (DESYREL) 50 MG tablet Take 1 tablet (50 mg total) by mouth at bedtime. 08/13/13  Yes Nita Sells, MD  warfarin (COUMADIN) 1 MG tablet Take 1/2 tablet daily Patient taking differently: Take 1/2 tablet qhs 09/08/14  Yes Satira Sark, MD  amoxicillin-clavulanate (AUGMENTIN) 875-125 MG per tablet Take 1 tablet by mouth 2 (two) times daily. One po bid x 7 days Patient not taking: Reported on 09/29/2014 09/09/14   Sharyon Cable, MD  B Complex-C-Zn-Folic Acid (DIALYVITE/ZINC PO) Take by mouth 3 (three) times a week. Patient has dialysis on Tuesdays, Thursdays, and Saturdays    Historical Provider, MD  traMADol (ULTRAM) 50 MG tablet Take by mouth every 8 (eight) hours as needed for moderate pain.    Historical Provider, MD   Triage Vitals: BP 117/72 mmHg  Pulse 76  Temp(Src) 98.3 F  (36.8 C) (Oral)  Resp 16  Ht 6\' 4"  (1.93 m)  Wt 250 lb (113.399 kg)  BMI 30.44 kg/m2  SpO2 97% Physical Exam  Constitutional: He is oriented to person, place, and time. He appears well-developed and well-nourished. No distress.  HENT:  Head: Normocephalic and atraumatic.  Mouth/Throat: Oropharynx is clear and moist.  Eyes: Conjunctivae are normal. Pupils are equal, round, and reactive to light.  Neck: Normal range of motion. Neck supple. No tracheal deviation present.  Cardiovascular: Normal rate, regular rhythm and normal heart sounds.   No murmur heard. Pulmonary/Chest: Effort normal and breath sounds normal. No respiratory distress. He has no wheezes. He exhibits no tenderness.  Abdominal: Soft. Bowel sounds are normal. He exhibits no distension. There is no tenderness. There is no rebound and no guarding.  Genitourinary:  5-6 cm abscess between anus and base of scrotum/midline. No cellulitis. No crepitus.   Musculoskeletal: Normal range of motion. He exhibits no edema.  Neurological: He is alert and oriented to person, place, and time.  Skin: Skin is warm and dry.  5-6 CM diameter abscess in perineal area between base of scrotum and anus.   Psychiatric: He has a normal mood and affect. His behavior is normal.  Nursing note and vitals reviewed.   ED Course  Procedures (including critical care time) DIAGNOSTIC STUDIES: Oxygen Saturation is 97% on room air, normal by my interpretation.    COORDINATION OF CARE: 1:12 PM-Discussed treatment plan which includes I&D with pt at bedside and pt agreed to plan.   Labs Review Labs Reviewed  CBC WITH DIFFERENTIAL/PLATELET - Abnormal; Notable for the following:    WBC 16.2 (*)    RBC 3.56 (*)    Hemoglobin 10.2 (*)    HCT 33.8 (*)    RDW 18.2 (*)    Neutrophils Relative % 89 (*)    Neutro Abs 14.3 (*)    Lymphocytes Relative 5 (*)    All other components within normal limits  CBG MONITORING, ED - Abnormal; Notable for the  following:    Glucose-Capillary 205 (*)    All other components within normal limits  COMPREHENSIVE METABOLIC PANEL    Imaging Review No results found.   EKG Interpretation None      MDM   I personally  performed the services described in this documentation, which was scribed in my presence. The recorded information has been reviewed and considered. Mirna Mires, MD  Reviewed nursing notes and prior charts for additional history.   Pt declines any pain medication.  INCISION AND DRAINAGE Performed by: Mirna Mires Consent: Verbal consent obtained. Risks and benefits: risks, benefits and alternatives were discussed Type: abscess  Body area: perineal  Anesthesia: local infiltration  Incision was made with a scalpel.  Local anesthetic: lidocaine 2% w epinephrine  Anesthetic total: 5 ml  Complexity: complex Blunt dissection to break up loculations  Drainage: purulent  Drainage amount: large  Packing material: 1/2 in iodoform gauze  Patient tolerance: Patient tolerated the procedure well with no immediate complications.  Pt tolerated well. Pain appears controlled. Pt declines pain med in ED.  Sterile dressing.  Drainage from abscess smells feculent.  Given recurrent abscess in region, will refer to gen surg follow up.  Pt tolerating po fluids. States feels well. No nv. No faintness or dizziness.  Pt currently appears stable for d/c.  Return precautions discussed.   Recheck 1445, pt alert, content, comfortable appearing. No fever or chills. No pain. Tolerating po.  Pt states feels ready for d/c to home.       Mirna Mires, MD 09/29/14 [phone]

## 2014-09-29 NOTE — ED Notes (Signed)
PT c/o abscess to scrotum x2 weeks. PT c/o swelling to scrotum and is painful. PT describes the abscess has drained some fluid but denies any drainage from area today. PT denies any urinary symptoms and is a dialysis pt on tu/thur/sat and took dialysis today.

## 2014-09-29 NOTE — Discharge Instructions (Signed)
It was our pleasure to provide your ER care today - we hope that you feel better.  Keep area very clean.   May try sitz baths for comfort/symptom relief.  Take tylenol/advil as need.  Follow up with general surgeon in the next couple days for recheck and possible packing removal (you may return to ED for recheck in 2 days time if unable to arrange follow up with surgeon).  Return to ER right away if worse, high fevers, weak/faint, severe pain, spreading redness/increased swelling, other concern.        Abscess An abscess is an infected area that contains a collection of pus and debris.It can occur in almost any part of the body. An abscess is also known as a furuncle or boil. CAUSES  An abscess occurs when tissue gets infected. This can occur from blockage of oil or sweat glands, infection of hair follicles, or a minor injury to the skin. As the body tries to fight the infection, pus collects in the area and creates pressure under the skin. This pressure causes pain. People with weakened immune systems have difficulty fighting infections and get certain abscesses more often.  SYMPTOMS Usually an abscess develops on the skin and becomes a painful mass that is red, warm, and tender. If the abscess forms under the skin, you may feel a moveable soft area under the skin. Some abscesses break open (rupture) on their own, but most will continue to get worse without care. The infection can spread deeper into the body and eventually into the bloodstream, causing you to feel ill.  DIAGNOSIS  Your caregiver will take your medical history and perform a physical exam. A sample of fluid may also be taken from the abscess to determine what is causing your infection. TREATMENT  Your caregiver may prescribe antibiotic medicines to fight the infection. However, taking antibiotics alone usually does not cure an abscess. Your caregiver may need to make a small cut (incision) in the abscess to drain the pus.  In some cases, gauze is packed into the abscess to reduce pain and to continue draining the area. HOME CARE INSTRUCTIONS  1. Only take over-the-counter or prescription medicines for pain, discomfort, or fever as directed by your caregiver. 2. If you were prescribed antibiotics, take them as directed. Finish them even if you start to feel better. 3. If gauze is used, follow your caregiver's directions for changing the gauze. 4. To avoid spreading the infection: 1. Keep your draining abscess covered with a bandage. 2. Wash your hands well. 3. Do not share personal care items, towels, or whirlpools with others. 4. Avoid skin contact with others. 5. Keep your skin and clothes clean around the abscess. 6. Keep all follow-up appointments as directed by your caregiver. SEEK MEDICAL CARE IF:   You have increased pain, swelling, redness, fluid drainage, or bleeding.  You have muscle aches, chills, or a general ill feeling.  You have a fever. MAKE SURE YOU:   Understand these instructions.  Will watch your condition.  Will get help right away if you are not doing well or get worse. Document Released: 05/24/2005 Document Revised: 02/13/2012 Document Reviewed: 10/27/2011 [facility] Patient Information 2015 [place], [place]. This information is not intended to replace advice given to you by your health care provider. Make sure you discuss any questions you have with your health care provider.     Sitz Bath A sitz bath is a warm water bath taken in the sitting position that covers only the  hips and buttocks. It may be used for either healing or hygiene purposes. Sitz baths are also used to relieve pain, itching, or muscle spasms. The water may contain medicine. Moist heat will help you heal and relax.  HOME CARE INSTRUCTIONS  Take 3 to 4 sitz baths a day. 7. Fill the bathtub half full with warm water. 8. Sit in the water and open the drain a little. 9. Turn on the warm water to keep the tub half  full. Keep the water running constantly. 10. Soak in the water for 15 to 20 minutes. 11. After the sitz bath, pat the affected area dry first. SEEK MEDICAL CARE IF:  You get worse instead of better. Stop the sitz baths if you get worse. MAKE SURE YOU:  Understand these instructions.  Will watch your condition.  Will get help right away if you are not doing well or get worse. Document Released: 05/06/2004 Document Revised: 05/08/2012 Document Reviewed: 11/11/2010 [facility] Patient Information 2015 [place], [place]. This information is not intended to replace advice given to you by your health care provider. Make sure you discuss any questions you have with your health care provider.

## 2014-10-02 ENCOUNTER — Encounter (HOSPITAL_COMMUNITY): Payer: Self-pay | Admitting: *Deleted

## 2014-10-02 ENCOUNTER — Emergency Department (HOSPITAL_COMMUNITY)
Admission: EM | Admit: 2014-10-02 | Discharge: 2014-10-02 | Disposition: A | Discharge status: Home / Self Care | Payer: Medicare Other | Attending: Emergency Medicine | Admitting: Emergency Medicine

## 2014-10-02 DIAGNOSIS — I2510 Atherosclerotic heart disease of native coronary artery without angina pectoris: Secondary | ICD-10-CM | POA: Diagnosis not present

## 2014-10-02 DIAGNOSIS — Z87891 Personal history of nicotine dependence: Secondary | ICD-10-CM | POA: Diagnosis not present

## 2014-10-02 DIAGNOSIS — Z8619 Personal history of other infectious and parasitic diseases: Secondary | ICD-10-CM | POA: Insufficient documentation

## 2014-10-02 DIAGNOSIS — Z794 Long term (current) use of insulin: Secondary | ICD-10-CM | POA: Insufficient documentation

## 2014-10-02 DIAGNOSIS — E119 Type 2 diabetes mellitus without complications: Secondary | ICD-10-CM | POA: Insufficient documentation

## 2014-10-02 DIAGNOSIS — I120 Hypertensive chronic kidney disease with stage 5 chronic kidney disease or end stage renal disease: Secondary | ICD-10-CM | POA: Diagnosis not present

## 2014-10-02 DIAGNOSIS — Z79899 Other long term (current) drug therapy: Secondary | ICD-10-CM | POA: Insufficient documentation

## 2014-10-02 DIAGNOSIS — Z7982 Long term (current) use of aspirin: Secondary | ICD-10-CM | POA: Diagnosis not present

## 2014-10-02 DIAGNOSIS — Z951 Presence of aortocoronary bypass graft: Secondary | ICD-10-CM | POA: Insufficient documentation

## 2014-10-02 DIAGNOSIS — I4891 Unspecified atrial fibrillation: Secondary | ICD-10-CM | POA: Insufficient documentation

## 2014-10-02 DIAGNOSIS — Z992 Dependence on renal dialysis: Secondary | ICD-10-CM | POA: Insufficient documentation

## 2014-10-02 DIAGNOSIS — Z8719 Personal history of other diseases of the digestive system: Secondary | ICD-10-CM | POA: Insufficient documentation

## 2014-10-02 DIAGNOSIS — Z4801 Encounter for change or removal of surgical wound dressing: Secondary | ICD-10-CM | POA: Diagnosis not present

## 2014-10-02 DIAGNOSIS — N186 End stage renal disease: Secondary | ICD-10-CM | POA: Insufficient documentation

## 2014-10-02 DIAGNOSIS — Z7901 Long term (current) use of anticoagulants: Secondary | ICD-10-CM | POA: Insufficient documentation

## 2014-10-02 DIAGNOSIS — Z5189 Encounter for other specified aftercare: Secondary | ICD-10-CM

## 2014-10-02 NOTE — ED Notes (Signed)
Wound dressed with 4x4s and paper per orders of Lily Kocher

## 2014-10-02 NOTE — ED Notes (Addendum)
Pt states he was supposed to follow up with his doctor for a wound recheck but is unable to see him until next week. Pt states packing was placed two days ago to scrotal area. States area is feeling much better. States CBG this a.m. At home Was 146.

## 2014-10-02 NOTE — ED Provider Notes (Signed)
CSN: [id number]     Arrival date & time 10/02/14  6010 History   First MD Initiated Contact with Patient 10/02/14 1053     Chief Complaint  Patient presents with  . Wound Check     (Consider location/radiation/quality/duration/timing/severity/associated sxs/prior Treatment) HPI Comments: Patient is a 64 year old male who presents to the emergency department with a requests for evaluation and recheck of his abscess. The patient was seen in the emergency department on February 2 at which time he was noted to have an abscess in the perineal area. The patient received incision and drainage at the time. He was to follow-up with Dr. Arnoldo Morale for surgical evaluation, but states he cannot get an appointment for 2 weeks. He presents now for removal of packing from this area. The patient denies any high fever. He's not had any new or excessive pain. He states he's been getting very good drainage from the area.  Patient is a 64 y.o. male presenting with wound check. The history is provided by the patient.  Wound Check This is a new problem. Associated symptoms include arthralgias. Pertinent negatives include no abdominal pain, chest pain, coughing or neck pain.    Past Medical History  Diagnosis Date  . UGI bleed 02/12/11    On anticoagulation; EGD w/snare polypectomy-multiple polypoid lesions antrum(benign), Chronic active gastritis (NEGATIVE H pylori)  . Anemia, chronic disease   . Sepsis due to enterococcus 02/11/11  . Atrial fibrillation     On coumadin  . Coronary atherosclerosis of native coronary artery     Multivessel status post CABG  . Cardiomyopathy   . Type 2 diabetes mellitus   . Essential hypertension, benign   . Hyperlipemia   . Gout   . S/P patent foramen ovale closure   . Hyperbilirubinemia 08/10/2013  . SBO (small bowel obstruction) 01/2014  . 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 Clostridium difficile colitis 01/2014  . Diverticulosis   . Internal hemorrhoids   . ESRD on  hemodialysis     Started 2008-2009. Dr Hinda Lenis on a TTS schedule - R forearm AVF for several years     Past Surgical History  Procedure Laterality Date  . Mitral valve replacement  01/05/2007    Bioprosthetic 61mm Edwards precordial tissue (Dr. Servando Snare)  . Coronary artery bypass graft  01/05/2007    LIMA-LAD, SVG-OM, seq. SVG-PDA, PLA-RCA  . Colonoscopy  06/23/2011    Dr. Oneida Alar: multiple sessile and pedunculated polyps, moderate diverticulosis, internal hemorrhoids, +ADENOMATOUS POLYPS, consider genetic testing, surveillance in October 2015   . Esophagogastroduodenoscopy  02/12/2011    CHRONIC ACTIVE GASTRITIS, NO H. pylori  . Transthoracic echocardiogram  11/2011    EF 25% w/ mod dilatation of LV & mod LVH; LA severely dilated; mod-severe dilation of RV with mod-severe decrease in RV function; mild MR & TR  . Cardioversion  04/19/2007    Dr. Marella Chimes  . Transthoracic echocardiogram  02/14/2011    EF 40-45%, mod conc LVH; ventricular septum with motion showing abnormal function, dyssynergy, paradox; mild AS; mild-mod MR stenosis; LA severely dilated; aptrial septum bowed from L to R with increased LA pressure   . Amputation Right 07/14/2013    Procedure: AMPUTATION DIGIT;  Surgeon: [name], MD;  Location: [facility];  Service: General;  Laterality: Right;  . Amputation Right 08/08/2013    Procedure: AMPUTATION BELOW KNEE;  Surgeon: [name], MD;  Location: [facility];  Service: General;  Laterality: Right;  . Stump revision Right  09/19/2013    Procedure: STUMP REVISION;  Surgeon: Scherry Ran, MD;  Location: [facility];  Service: General;  Laterality: Right;  . Amputation Right 10/17/2013    Procedure: AMPUTATION ABOVE KNEE;  Surgeon: [name], MD;  Location: [facility];  Service: General;  Laterality: Right;  . Colonoscopy N/A 04/10/2014    Procedure: COLONOSCOPY;  Surgeon: [name], MD;  Location: [facility];  Service: Endoscopy;  Laterality: N/A;  830  . Tee without  cardioversion N/A 08/19/2014    Procedure: TRANSESOPHAGEAL ECHOCARDIOGRAM (TEE);  Surgeon: [name], MD;  Location: [facility] ENDOSCOPY;  Service: Cardiovascular;  Laterality: N/A;  . Cardioversion N/A 08/19/2014    Procedure: CARDIOVERSION;  Surgeon: [name], MD;  Location: [facility] ENDOSCOPY;  Service: Cardiovascular;  Laterality: N/A;   Family History  Problem Relation Age of Onset  . Diabetes Mother   . Diabetes Father   . Diabetes Sister   . Colon cancer Neg Hx   . Colon polyps Neg Hx    History  Substance Use Topics  . Smoking status: Former Smoker -- 1.00 packs/day for 8 years    Types: Cigarettes    Start date: 04/27/1969    Quit date: 11/27/2006  . Smokeless tobacco: Never Used     Comment: quit about 5 yrs  . Alcohol Use: No    Review of Systems  Constitutional: Negative for activity change.       All ROS Neg except as noted in HPI  HENT: Negative.   Eyes: Negative for photophobia and discharge.  Respiratory: Negative for cough, shortness of breath and wheezing.   Cardiovascular: Negative for chest pain and palpitations.  Gastrointestinal: Negative for abdominal pain and blood in stool.  Genitourinary: Negative for dysuria, frequency and hematuria.  Musculoskeletal: Positive for arthralgias. Negative for back pain and neck pain.  Skin: Positive for wound.  Neurological: Negative for dizziness, seizures and speech difficulty.  Psychiatric/Behavioral: Negative for hallucinations and confusion.      Allergies  Bacitracin and Norvasc  Home Medications   Prior to Admission medications   Medication Sig Start Date End Date Taking? Authorizing Provider  albuterol (PROVENTIL) (2.5 MG/3ML) 0.083% nebulizer solution Take 3 mLs (2.5 mg total) by nebulization every 2 (two) hours as needed for wheezing. 02/21/14  Yes Kinnie Feil, MD  allopurinol (ZYLOPRIM) 100 MG tablet Take 100 mg by mouth daily.  05/15/11  Yes Historical Provider, MD  amiodarone (PACERONE) 200  MG tablet Take 1 tablet (200 mg total) by mouth daily. 08/31/14  Yes Satira Sark, MD  aspirin EC 81 MG tablet Take 81 mg by mouth daily.   Yes Historical Provider, MD  atorvastatin (LIPITOR) 20 MG tablet TAKE 1 TABLET IN THE EVENING--NEED APPT FOR FURTHER REFILLS. 09/21/14  Yes Satira Sark, MD  B Complex-C-Zn-Folic Acid (DIALYVITE/ZINC PO) Take by mouth 3 (three) times a week. Patient has dialysis on Tuesdays, Thursdays, and Saturdays   Yes Historical Provider, MD  FLUoxetine (PROZAC) 20 MG capsule Take 20 mg by mouth daily. 09/21/14  Yes Historical Provider, MD  folic acid (FOLVITE) 1 MG tablet Take 1 mg by mouth daily.     Yes Historical Provider, MD  HYDROcodone-acetaminophen (NORCO/VICODIN) 5-325 MG per tablet Take 1 tablet by mouth every 6 (six) hours as needed for moderate pain. 08/06/14  Yes Carole Civil, MD  insulin glargine (LANTUS) 100 UNIT/ML injection Inject 10 Units into the skin at bedtime.   Yes Historical Provider, MD  metoprolol (LOPRESSOR) 50 MG tablet Take 75 mg by mouth 2 (two) times daily. 07/04/14  Yes Historical Provider, MD  midodrine (PROAMATINE) 10 MG tablet Take 1 tablet (10 mg total) by mouth 2 (two) times daily with a meal. 08/20/14  Yes Dawood Elgergawy, MD  pantoprazole (PROTONIX) 40 MG tablet Take 40 mg by mouth 2 (two) times daily.  05/25/11  Yes Historical Provider, MD  RENVELA 800 MG tablet Take 800 mg by mouth 3 (three) times daily with meals.  05/03/11  Yes Historical Provider, MD  SENSIPAR 30 MG tablet Take 30 mg by mouth daily. 01/22/14  Yes Historical Provider, MD  traMADol (ULTRAM) 50 MG tablet Take by mouth every 8 (eight) hours as needed for moderate pain.   Yes Historical Provider, MD  traZODone (DESYREL) 50 MG tablet Take 1 tablet (50 mg total) by mouth at bedtime. 08/13/13  Yes Nita Sells, MD  warfarin (COUMADIN) 1 MG tablet Take 1/2 tablet daily Patient taking differently: Take 1/2 tablet qhs 09/08/14  Yes Satira Sark, MD   amoxicillin-clavulanate (AUGMENTIN) 875-125 MG per tablet Take 1 tablet by mouth 2 (two) times daily. One po bid x 7 days Patient not taking: Reported on 09/29/2014 09/09/14   Sharyon Cable, MD   BP 133/64 mmHg  Pulse 65  Temp(Src) 97.9 F (36.6 C) (Oral)  Resp 16  SpO2 99% Physical Exam  Constitutional: He is oriented to person, place, and time. He appears well-developed and well-nourished.  Non-toxic appearance.  HENT:  Head: Normocephalic.  Right Ear: Tympanic membrane and external ear normal.  Left Ear: Tympanic membrane and external ear normal.  Eyes: EOM and lids are normal. Pupils are equal, round, and reactive to light.  Neck: Normal range of motion. Neck supple. Carotid bruit is not present.  Cardiovascular: Normal rate, regular rhythm, normal heart sounds, intact distal pulses and normal pulses.   Pulmonary/Chest: Breath sounds normal. No respiratory distress.  Abdominal: Soft. Bowel sounds are normal. There is no tenderness. There is no guarding.  Genitourinary:  The incision and drainage area of the peroneal region is progressing nicely. There is moderate drainage from the site. There no red streaks appreciated. According to family members the swelling has improved tremendously. Is no testicular tenderness. There is no increased redness of the scrotal sac. There is no penile tenderness. The packing has been removed prior to the patient's arrival to the emergency room.  Musculoskeletal: Normal range of motion.  Right above-knee amputation.  Lymphadenopathy:       Head (right side): No submandibular adenopathy present.       Head (left side): No submandibular adenopathy present.    He has no cervical adenopathy.  Neurological: He is alert and oriented to person, place, and time. He has normal strength. No cranial nerve deficit or sensory deficit.  Skin: Skin is warm and dry.  Psychiatric: He has a normal mood and affect. His speech is normal.  Nursing note and vitals  reviewed.   ED Course  Procedures (including critical care time) Labs Review Labs Reviewed - No data to display  Imaging Review No results found.   EKG Interpretation None      MDM  Vital signs are within normal limits. The packing to the perineal area was removed prior to the patient's arrival in the emergency department. No red streaks appreciated, or signs of advancing infection. A new dressing was applied. The patient will follow-up with Dr. Arnoldo Morale as point. Patient will return to the emergency department  if any emergent changes, problems, or concerns.    Final diagnoses:  None    **I have reviewed nursing notes, vital signs, and all appropriate lab and imaging results for this patient.Lenox Ahr, PA-C 10/02/14 1141  Fredia Sorrow, MD 10/02/14 1150

## 2014-10-02 NOTE — Discharge Instructions (Signed)
The incision and drainage of wound is healing and progressing nicely. Please continue to cleanse daily with soap and water, and apply dressing. Please keep the appointment with Dr. Arnoldo Morale as scheduled. Please return to the emergency department if any unusual high fevers, unusual redness, or red streaking the area of the incision and drainage site. Wound Check Your wound appears healthy today. Your wound will heal gradually over time. Eventually a scar will form that will fade with time. FACTORS THAT AFFECT SCAR FORMATION:  People differ in the severity in which they scar.  Scar severity varies according to location, size, and the traits you inherited from your parents (genetic predisposition).  Irritation to the wound from infection, rubbing, or chemical exposure will increase the amount of scar formation. HOME CARE INSTRUCTIONS   If you were given a dressing, you should change it at least once a day or as instructed by your caregiver. If the bandage sticks, soak it off with a solution of hydrogen peroxide.  If the bandage becomes wet, dirty, or develops a bad smell, change it as soon as possible.  Look for signs of infection.  Only take over-the-counter or prescription medicines for pain, discomfort, or fever as directed by your caregiver. SEEK IMMEDIATE MEDICAL CARE IF:   You have redness, swelling, or increasing pain in the wound.  You notice pus coming from the wound.  You have a fever.  You notice a bad smell coming from the wound or dressing. Document Released: 05/20/2004 Document Revised: 11/06/2011 Document Reviewed: 08/14/2005 [facility] Patient Information 2015 [place], [place]. This information is not intended to replace advice given to you by your health care provider. Make sure you discuss any questions you have with your health care provider.

## 2014-10-02 NOTE — ED Notes (Signed)
Patient given discharge instructions, teach back method used. No questions. No pain/complaints at time of discharge. Patient instructed to stop at registration window to complete any additional paperwork. Patient left ED with spouse in wheelchair from home.

## 2014-10-05 NOTE — Telephone Encounter (Signed)
Called to follow up on referral, Dr Foye Spurling office states they have been unable to reach patient after multiple attempts

## 2014-10-08 ENCOUNTER — Ambulatory Visit (INDEPENDENT_AMBULATORY_CARE_PROVIDER_SITE_OTHER): Payer: Medicare Other | Admitting: *Deleted

## 2014-10-08 DIAGNOSIS — Z952 Presence of prosthetic heart valve: Secondary | ICD-10-CM

## 2014-10-08 DIAGNOSIS — I4891 Unspecified atrial fibrillation: Secondary | ICD-10-CM

## 2014-10-08 DIAGNOSIS — Z954 Presence of other heart-valve replacement: Secondary | ICD-10-CM

## 2014-10-08 DIAGNOSIS — Z7901 Long term (current) use of anticoagulants: Secondary | ICD-10-CM

## 2014-10-08 LAB — POCT INR
INR: 1.8
INR: 1.8

## 2014-10-19 ENCOUNTER — Ambulatory Visit (INDEPENDENT_AMBULATORY_CARE_PROVIDER_SITE_OTHER): Payer: PRIVATE HEALTH INSURANCE | Admitting: *Deleted

## 2014-10-19 ENCOUNTER — Encounter: Payer: Self-pay | Admitting: *Deleted

## 2014-10-19 ENCOUNTER — Ambulatory Visit (INDEPENDENT_AMBULATORY_CARE_PROVIDER_SITE_OTHER): Payer: Medicare Other | Admitting: Cardiology

## 2014-10-19 ENCOUNTER — Encounter: Payer: Self-pay | Admitting: Cardiology

## 2014-10-19 ENCOUNTER — Encounter (INDEPENDENT_AMBULATORY_CARE_PROVIDER_SITE_OTHER): Payer: Medicare Other

## 2014-10-19 VITALS — BP 119/69 | HR 65 | Ht 76.0 in | Wt 196.0 lb

## 2014-10-19 DIAGNOSIS — I4891 Unspecified atrial fibrillation: Secondary | ICD-10-CM

## 2014-10-19 DIAGNOSIS — I429 Cardiomyopathy, unspecified: Secondary | ICD-10-CM

## 2014-10-19 DIAGNOSIS — N186 End stage renal disease: Secondary | ICD-10-CM

## 2014-10-19 DIAGNOSIS — Z7901 Long term (current) use of anticoagulants: Secondary | ICD-10-CM

## 2014-10-19 DIAGNOSIS — I2510 Atherosclerotic heart disease of native coronary artery without angina pectoris: Secondary | ICD-10-CM

## 2014-10-19 DIAGNOSIS — I480 Paroxysmal atrial fibrillation: Secondary | ICD-10-CM

## 2014-10-19 DIAGNOSIS — Z952 Presence of prosthetic heart valve: Secondary | ICD-10-CM

## 2014-10-19 DIAGNOSIS — Z954 Presence of other heart-valve replacement: Secondary | ICD-10-CM

## 2014-10-19 DIAGNOSIS — E785 Hyperlipidemia, unspecified: Secondary | ICD-10-CM

## 2014-10-19 LAB — POCT INR: INR: 1.8

## 2014-10-19 NOTE — Addendum Note (Signed)
Addended by: Julian Hy T on: 10/19/2014 01:40 PM   Modules accepted: Orders, Medications

## 2014-10-19 NOTE — Patient Instructions (Addendum)
Your physician recommends that you schedule a follow-up appointment in: 4 months with Dr. Domenic Polite  Your physician has recommended you make the following change in your medication:   Mojave  Your physician recommends that you return for lab work LIPIDS NOW  TSH/HFT [place] NEXT VISIT  Thank you for choosing [facility]!!

## 2014-10-19 NOTE — Progress Notes (Signed)
Cardiology Office Note  Date: 10/19/2014   ID: [name] [name], [name] [name] [birth year], MRN [id number]  PCP: [name], MD  Primary Cardiologist: Rozann Lesches, MD   Chief Complaint  Patient presents with  . Coronary Artery Disease  . Cardiomyopathy  . Atrial Fibrillation    History of Present Illness: [name] [name] is a medically complex 64 y.o. male seen recently in January. He presents with his wife for a follow-up visit. Overall, he reports being back to baseline. Remains functionally limited, indicates that he has been tolerating hemodialysis. He denies any palpitations or chest pain.  Coumadin now being followed by our anticoagulation clinic. He reports no bleeding episodes. We did discuss stopping his aspirin to reduce risk of future bleeds, he has a prior history of GI bleeding.  We reviewed his additional cardiac medications. His wife states that they are due for a refill of Lipitor, but is not certain that he has been taking this for the last few months anyway. This was not indicated on his discharge summary from December 2015. He has not had a recent lipid panel.  Past Medical History  Diagnosis Date  . UGI bleed 02/12/11    On anticoagulation; EGD w/snare polypectomy-multiple polypoid lesions antrum(benign), Chronic active gastritis (NEGATIVE H pylori)  . Anemia, chronic disease   . Sepsis due to enterococcus 02/11/11  . Atrial fibrillation     On coumadin  . Coronary atherosclerosis of native coronary artery     Multivessel status post CABG  . Cardiomyopathy   . Type 2 diabetes mellitus   . Essential hypertension, benign   . Hyperlipemia   . Gout   . S/P patent foramen ovale closure   . Hyperbilirubinemia 08/10/2013  . SBO (small bowel obstruction) 01/2014  . 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 Clostridium difficile colitis 01/2014  . Diverticulosis   . Internal hemorrhoids   . ESRD on hemodialysis     Started 2008-2009. Dr Hinda Lenis on a TTS schedule - R  forearm AVF for several years      Past Surgical History  Procedure Laterality Date  . Mitral valve replacement  01/05/2007    Bioprosthetic 65mm Edwards precordial tissue (Dr. Servando Snare)  . Coronary artery bypass graft  01/05/2007    LIMA-LAD, SVG-OM, seq. SVG-PDA, PLA-RCA  . Colonoscopy  06/23/2011    Dr. Oneida Alar: multiple sessile and pedunculated polyps, moderate diverticulosis, internal hemorrhoids, +ADENOMATOUS POLYPS, consider genetic testing, surveillance in October 2015   . Esophagogastroduodenoscopy  02/12/2011    CHRONIC ACTIVE GASTRITIS, NO H. pylori  . Transthoracic echocardiogram  11/2011    EF 25% w/ mod dilatation of LV & mod LVH; LA severely dilated; mod-severe dilation of RV with mod-severe decrease in RV function; mild MR & TR  . Cardioversion  04/19/2007    Dr. Marella Chimes  . Transthoracic echocardiogram  02/14/2011    EF 40-45%, mod conc LVH; ventricular septum with motion showing abnormal function, dyssynergy, paradox; mild AS; mild-mod MR stenosis; LA severely dilated; aptrial septum bowed from L to R with increased LA pressure   . Amputation Right 07/14/2013    Procedure: AMPUTATION DIGIT;  Surgeon: [name], MD;  Location: [facility];  Service: General;  Laterality: Right;  . Amputation Right 08/08/2013    Procedure: AMPUTATION BELOW KNEE;  Surgeon: [name], MD;  Location: [facility];  Service: General;  Laterality: Right;  . Stump revision Right 09/19/2013    Procedure: STUMP REVISION;  Surgeon: Gwyndolyn Saxon  Hulan Amato, MD;  Location: [facility];  Service: General;  Laterality: Right;  . Amputation Right 10/17/2013    Procedure: AMPUTATION ABOVE KNEE;  Surgeon: [name], MD;  Location: [facility];  Service: General;  Laterality: Right;  . Colonoscopy N/A 04/10/2014    Procedure: COLONOSCOPY;  Surgeon: [name], MD;  Location: [facility];  Service: Endoscopy;  Laterality: N/A;  830  . Tee without cardioversion N/A 08/19/2014    Procedure: TRANSESOPHAGEAL  ECHOCARDIOGRAM (TEE);  Surgeon: [name], MD;  Location: [facility] ENDOSCOPY;  Service: Cardiovascular;  Laterality: N/A;  . Cardioversion N/A 08/19/2014    Procedure: CARDIOVERSION;  Surgeon: [name], MD;  Location: [facility] ENDOSCOPY;  Service: Cardiovascular;  Laterality: N/A;    Current Outpatient Prescriptions  Medication Sig Dispense Refill  . albuterol (PROVENTIL) (2.5 MG/3ML) 0.083% nebulizer solution Take 3 mLs (2.5 mg total) by nebulization every 2 (two) hours as needed for wheezing. 75 mL 12  . allopurinol (ZYLOPRIM) 100 MG tablet Take 100 mg by mouth daily.     Marland Kitchen amiodarone (PACERONE) 200 MG tablet Take 1 tablet (200 mg total) by mouth daily.    Marland Kitchen atorvastatin (LIPITOR) 20 MG tablet TAKE 1 TABLET IN THE EVENING--NEED APPT FOR FURTHER REFILLS. (Patient taking differently: TAKE 1 TABLET IN THE EVENING) 30 tablet 6  . B Complex-C-Zn-Folic Acid (DIALYVITE/ZINC PO) Take by mouth 3 (three) times a week. Patient has dialysis on Tuesdays, Thursdays, and Saturdays    . FLUoxetine (PROZAC) 20 MG capsule Take 20 mg by mouth daily.    . folic acid (FOLVITE) 1 MG tablet Take 1 mg by mouth daily.      Marland Kitchen HYDROcodone-acetaminophen (NORCO/VICODIN) 5-325 MG per tablet Take 1 tablet by mouth every 6 (six) hours as needed for moderate pain. 100 tablet 0  . insulin glargine (LANTUS) 100 UNIT/ML injection Inject 10 Units into the skin at bedtime.    . metoprolol (LOPRESSOR) 50 MG tablet Take 75 mg by mouth 2 (two) times daily.    . midodrine (PROAMATINE) 10 MG tablet Take 1 tablet (10 mg total) by mouth 2 (two) times daily with a meal.    . pantoprazole (PROTONIX) 40 MG tablet Take 40 mg by mouth 2 (two) times daily.     Marland Kitchen RENVELA 800 MG tablet Take 800 mg by mouth 3 (three) times daily with meals.     . SENSIPAR 30 MG tablet Take 30 mg by mouth daily.    . traMADol (ULTRAM) 50 MG tablet Take by mouth every 8 (eight) hours as needed for moderate pain.    . traZODone (DESYREL) 50 MG tablet Take 1  tablet (50 mg total) by mouth at bedtime. 30 tablet 0  . warfarin (COUMADIN) 1 MG tablet Take 1/2 tablet daily (Patient taking differently: Take 1/2 tablet qhs) 30 tablet 3   No current facility-administered medications for this visit.    Allergies:  Bacitracin and Norvasc   Social History: The patient  reports that he quit smoking about 7 years ago. His smoking use included Cigarettes. He started smoking about 45 years ago. He has a 8 pack-year smoking history. He has never used smokeless tobacco. He reports that he does not drink alcohol or use illicit drugs.   ROS:  Please see the history of present illness. Otherwise, complete review of systems is positive for none.  All other systems are reviewed and negative.    Physical Exam: VS:  BP 119/69 mmHg  Pulse 65  Ht 6\' 4"  (1.93 m)  Wt 196 lb (88.905 kg)  BMI 23.87 kg/m2  SpO2 96%, BMI Body mass index is 23.87 kg/(m^2).  Wt Readings from Last 3 Encounters:  10/19/14 196 lb (88.905 kg)  09/29/14 250 lb (113.399 kg)  09/17/14 205 lb (92.987 kg)     Appears comfortable at rest, seated in wheelchair.  HEENT: Conjunctiva and lids normal, oropharynx clear.  Neck: Supple, no elevated JVP, no thyromegaly.  Lungs: Clear to auscultation with reduced breath sounds on the right base, nonlabored breathing at rest.  Cardiac: Regular rate and rhythm, S4, soft systolic murmur, no pericardial rub.  Abdomen: Soft, nontender, bowel sounds present.  Extremities: No pitting edema, status post right AKA. Dialysis fistula in the right arm.  Skin: Warm and dry.  Musculoskeletal: No kyphosis.  Neuropsychiatric: Alert and oriented x3, affect grossly appropriate.   ECG: ECG is not ordered today.   Recent Labwork: 03/02/2014: Magnesium 1.8 08/13/2014: TSH 1.210 08/15/2014: Pro B Natriuretic peptide (BNP) >70000.0* 09/29/2014: ALT 14; AST 29; BUN 18; Creatinine 3.92*; Hemoglobin 10.2*; Platelets 308; Potassium 3.8; Sodium 138     Component  Value Date/Time   CHOL 60 03/02/2014 0320   TRIG 54 03/02/2014 0320   HDL 27* 03/02/2014 0320   CHOLHDL 2.2 03/02/2014 0320   VLDL 11 03/02/2014 0320   LDLCALC 22 03/02/2014 0320    Other Studies Reviewed Today:  Echocardiogram in April 2015 revealed mildly dilated LV with mild LVH, LVEF 15-94%, grade 3 diastolic dysfunction, minimal aortic stenosis with mean gradient 7 mm mercury, grossly normal bioprosthetic mitral valve function with trivial mitral regurgitation, severe left atrial enlargement, moderately reduced RV contraction.   Assessment and Plan:  1. Multivessel CAD status post CABG with concurrent mitral valve replacement in 2008. Patient is clinically stable without active angina symptoms. We will stop aspirin since he is concurrently on Coumadin to reduce risk of future bleeding. Otherwise continue Lopressor.  2. Mixed cardiomyopathy, LVEF approximately 20% as outlined above. He is on beta blocker, no ACE inhibitor or ARB with history of hypotension requiring ProAmatine. We have discussed ICD options and decision is not to pursue this.  3. End-stage renal disease on hemodialysis.  4. History of orthostatic hypotension, on ProAmatine.  5. Paroxysmal atrial fibrillation, heart rate regular today. Continue amiodarone, follow-up LFTs and TSH for next visit.  6. History of hyperlipidemia, previously on Lipitor. Uncertain exactly how Harts he has been off of the medication. We will follow-up with a lipid panel.  Current medicines are reviewed at length with the patient today.  The patient has concerns regarding medicines. As outlined above, we will recheck lipid panel prior to reinstituting Lipitor, also stopping aspirin.    Orders Placed This Encounter  Procedures  . Lipid Profile  . Hepatic function panel  . TSH    Disposition: FU with me in 4 months.   Signed, Satira Sark, MD, [facility] 10/19/2014 1:30 PM    [facility] [place], [place], [place] [address] Phone: [phone]; Fax: [phone]

## 2014-11-06 ENCOUNTER — Telehealth: Payer: Self-pay | Admitting: *Deleted

## 2014-11-06 NOTE — Telephone Encounter (Signed)
Patient informed that requested lab work will need to be done either at [facility] or [place] lab due to nurse at dialysis stating they couldn't run additional labs unless their physician orders them.

## 2014-11-09 ENCOUNTER — Emergency Department (HOSPITAL_COMMUNITY): Payer: Medicare Other

## 2014-11-09 ENCOUNTER — Encounter (HOSPITAL_COMMUNITY): Payer: Self-pay | Admitting: Anesthesiology

## 2014-11-09 ENCOUNTER — Encounter: Payer: Self-pay | Admitting: *Deleted

## 2014-11-09 ENCOUNTER — Inpatient Hospital Stay (HOSPITAL_COMMUNITY)
Admission: EM | Admit: 2014-11-09 | Discharge: 2014-11-19 | DRG: 186 | Disposition: A | Discharge status: Home Health | Payer: Medicare Other | Attending: Internal Medicine | Admitting: Internal Medicine

## 2014-11-09 ENCOUNTER — Encounter (HOSPITAL_COMMUNITY): Payer: Self-pay

## 2014-11-09 DIAGNOSIS — Z952 Presence of prosthetic heart valve: Secondary | ICD-10-CM | POA: Diagnosis not present

## 2014-11-09 DIAGNOSIS — Z992 Dependence on renal dialysis: Secondary | ICD-10-CM | POA: Diagnosis not present

## 2014-11-09 DIAGNOSIS — Z8774 Personal history of (corrected) congenital malformations of heart and circulatory system: Secondary | ICD-10-CM | POA: Diagnosis not present

## 2014-11-09 DIAGNOSIS — E119 Type 2 diabetes mellitus without complications: Secondary | ICD-10-CM | POA: Diagnosis present

## 2014-11-09 DIAGNOSIS — Z4682 Encounter for fitting and adjustment of non-vascular catheter: Secondary | ICD-10-CM

## 2014-11-09 DIAGNOSIS — I509 Heart failure, unspecified: Secondary | ICD-10-CM | POA: Diagnosis present

## 2014-11-09 DIAGNOSIS — N186 End stage renal disease: Secondary | ICD-10-CM | POA: Diagnosis present

## 2014-11-09 DIAGNOSIS — Z794 Long term (current) use of insulin: Secondary | ICD-10-CM

## 2014-11-09 DIAGNOSIS — I120 Hypertensive chronic kidney disease with stage 5 chronic kidney disease or end stage renal disease: Secondary | ICD-10-CM | POA: Diagnosis present

## 2014-11-09 DIAGNOSIS — Z89611 Acquired absence of right leg above knee: Secondary | ICD-10-CM

## 2014-11-09 DIAGNOSIS — Z79899 Other long term (current) drug therapy: Secondary | ICD-10-CM

## 2014-11-09 DIAGNOSIS — J90 Pleural effusion, not elsewhere classified: Principal | ICD-10-CM | POA: Diagnosis present

## 2014-11-09 DIAGNOSIS — Z79891 Long term (current) use of opiate analgesic: Secondary | ICD-10-CM

## 2014-11-09 DIAGNOSIS — D638 Anemia in other chronic diseases classified elsewhere: Secondary | ICD-10-CM | POA: Diagnosis present

## 2014-11-09 DIAGNOSIS — Z951 Presence of aortocoronary bypass graft: Secondary | ICD-10-CM | POA: Diagnosis not present

## 2014-11-09 DIAGNOSIS — Z87891 Personal history of nicotine dependence: Secondary | ICD-10-CM | POA: Diagnosis not present

## 2014-11-09 DIAGNOSIS — Z7901 Long term (current) use of anticoagulants: Secondary | ICD-10-CM

## 2014-11-09 DIAGNOSIS — Z789 Other specified health status: Secondary | ICD-10-CM | POA: Diagnosis not present

## 2014-11-09 DIAGNOSIS — I2510 Atherosclerotic heart disease of native coronary artery without angina pectoris: Secondary | ICD-10-CM | POA: Diagnosis present

## 2014-11-09 DIAGNOSIS — I4891 Unspecified atrial fibrillation: Secondary | ICD-10-CM | POA: Diagnosis present

## 2014-11-09 DIAGNOSIS — J869 Pyothorax without fistula: Secondary | ICD-10-CM | POA: Diagnosis present

## 2014-11-09 DIAGNOSIS — I482 Chronic atrial fibrillation: Secondary | ICD-10-CM | POA: Diagnosis present

## 2014-11-09 DIAGNOSIS — E1122 Type 2 diabetes mellitus with diabetic chronic kidney disease: Secondary | ICD-10-CM | POA: Diagnosis present

## 2014-11-09 DIAGNOSIS — I480 Paroxysmal atrial fibrillation: Secondary | ICD-10-CM | POA: Diagnosis not present

## 2014-11-09 DIAGNOSIS — Z9689 Presence of other specified functional implants: Secondary | ICD-10-CM

## 2014-11-09 DIAGNOSIS — N2581 Secondary hyperparathyroidism of renal origin: Secondary | ICD-10-CM | POA: Diagnosis present

## 2014-11-09 DIAGNOSIS — R0602 Shortness of breath: Secondary | ICD-10-CM | POA: Diagnosis present

## 2014-11-09 DIAGNOSIS — J939 Pneumothorax, unspecified: Secondary | ICD-10-CM

## 2014-11-09 DIAGNOSIS — E785 Hyperlipidemia, unspecified: Secondary | ICD-10-CM | POA: Diagnosis present

## 2014-11-09 DIAGNOSIS — I739 Peripheral vascular disease, unspecified: Secondary | ICD-10-CM | POA: Diagnosis present

## 2014-11-09 DIAGNOSIS — Z8614 Personal history of Methicillin resistant Staphylococcus aureus infection: Secondary | ICD-10-CM

## 2014-11-09 DIAGNOSIS — Z888 Allergy status to other drugs, medicaments and biological substances status: Secondary | ICD-10-CM

## 2014-11-09 DIAGNOSIS — A047 Enterocolitis due to Clostridium difficile: Secondary | ICD-10-CM | POA: Diagnosis present

## 2014-11-09 LAB — CBC WITH DIFFERENTIAL/PLATELET
Basophils Absolute: 0 10*3/uL (ref 0.0–0.1)
Basophils Relative: 0 % (ref 0–1)
Eosinophils Absolute: 0.3 10*3/uL (ref 0.0–0.7)
Eosinophils Relative: 3 % (ref 0–5)
HEMATOCRIT: 33 % — AB (ref 39.0–52.0)
Hemoglobin: 10 g/dL — ABNORMAL LOW (ref 13.0–17.0)
LYMPHS ABS: 0.9 10*3/uL (ref 0.7–4.0)
LYMPHS PCT: 11 % — AB (ref 12–46)
MCH: 28.5 pg (ref 26.0–34.0)
MCHC: 30.3 g/dL (ref 30.0–36.0)
MCV: 94 fL (ref 78.0–100.0)
Monocytes Absolute: 0.7 10*3/uL (ref 0.1–1.0)
Monocytes Relative: 9 % (ref 3–12)
NEUTROS ABS: 6.1 10*3/uL (ref 1.7–7.7)
Neutrophils Relative %: 77 % (ref 43–77)
PLATELETS: 238 10*3/uL (ref 150–400)
RBC: 3.51 MIL/uL — ABNORMAL LOW (ref 4.22–5.81)
RDW: 16.8 % — ABNORMAL HIGH (ref 11.5–15.5)
WBC: 7.9 10*3/uL (ref 4.0–10.5)

## 2014-11-09 LAB — BASIC METABOLIC PANEL
Anion gap: 12 (ref 5–15)
BUN: 30 mg/dL — ABNORMAL HIGH (ref 6–23)
CO2: 28 mmol/L (ref 19–32)
CREATININE: 6.85 mg/dL — AB (ref 0.50–1.35)
Calcium: 7.8 mg/dL — ABNORMAL LOW (ref 8.4–10.5)
Chloride: 100 mmol/L (ref 96–112)
GFR calc non Af Amer: 8 mL/min — ABNORMAL LOW (ref >90)
GFR, EST AFRICAN AMERICAN: 9 mL/min — AB (ref >90)
GLUCOSE: 92 mg/dL (ref 70–99)
Potassium: 4.4 mmol/L (ref 3.5–5.1)
Sodium: 140 mmol/L (ref 135–145)

## 2014-11-09 LAB — CBG MONITORING, ED: Glucose-Capillary: 159 mg/dL — ABNORMAL HIGH (ref 70–99)

## 2014-11-09 LAB — MRSA PCR SCREENING: MRSA by PCR: POSITIVE — AB

## 2014-11-09 LAB — TROPONIN I: Troponin I: <0.03 ng/mL (ref <0.031)

## 2014-11-09 LAB — PROTIME-INR
INR: 1.31 (ref 0.00–1.49)
PROTHROMBIN TIME: 16.4 s — AB (ref 11.6–15.2)

## 2014-11-09 MED ORDER — INSULIN ASPART 100 UNIT/ML ~~LOC~~ SOLN: 0.0000 [IU] | Freq: Every day | SUBCUTANEOUS | Status: DC

## 2014-11-09 MED ORDER — PANTOPRAZOLE SODIUM 40 MG PO TBEC
40.0000 mg | DELAYED_RELEASE_TABLET | Freq: Two times a day (BID) | ORAL | Status: DC
  Administered 2014-11-09 – 2014-11-19 (×18): 40 mg via ORAL
  Filled 2014-11-09 (×16): qty 1

## 2014-11-09 MED ORDER — TRAMADOL HCL 50 MG PO TABS
50.0000 mg | ORAL_TABLET | Freq: Two times a day (BID) | ORAL | Status: DC | PRN
  Administered 2014-11-10: 50 mg via ORAL
  Filled 2014-11-09: qty 1

## 2014-11-09 MED ORDER — ALBUTEROL SULFATE (2.5 MG/3ML) 0.083% IN NEBU: 2.5000 mg | INHALATION_SOLUTION | RESPIRATORY_TRACT | Status: DC | PRN

## 2014-11-09 MED ORDER — ALLOPURINOL 100 MG PO TABS
100.0000 mg | ORAL_TABLET | Freq: Every day | ORAL | Status: DC
  Administered 2014-11-09 – 2014-11-19 (×9): 100 mg via ORAL
  Filled 2014-11-09 (×11): qty 1

## 2014-11-09 MED ORDER — ONDANSETRON HCL 4 MG PO TABS: 4.0000 mg | ORAL_TABLET | Freq: Four times a day (QID) | ORAL | Status: DC | PRN

## 2014-11-09 MED ORDER — IOHEXOL 300 MG/ML  SOLN
80.0000 mL | Freq: Once | INTRAMUSCULAR | Status: AC | PRN
  Administered 2014-11-09: 80 mL via INTRAVENOUS

## 2014-11-09 MED ORDER — SODIUM CHLORIDE 0.9 % IJ SOLN
3.0000 mL | Freq: Two times a day (BID) | INTRAMUSCULAR | Status: DC
  Administered 2014-11-10 – 2014-11-17 (×8): 3 mL via INTRAVENOUS

## 2014-11-09 MED ORDER — AMIODARONE HCL 200 MG PO TABS
200.0000 mg | ORAL_TABLET | Freq: Every day | ORAL | Status: DC
Start: 2014-11-09 — End: 2014-11-19
  Administered 2014-11-09 – 2014-11-18 (×9): 200 mg via ORAL
  Filled 2014-11-09 (×11): qty 1

## 2014-11-09 MED ORDER — SEVELAMER CARBONATE 800 MG PO TABS
800.0000 mg | ORAL_TABLET | Freq: Three times a day (TID) | ORAL | Status: DC
  Administered 2014-11-09 – 2014-11-19 (×23): 800 mg via ORAL
  Filled 2014-11-09 (×32): qty 1

## 2014-11-09 MED ORDER — INSULIN GLARGINE 100 UNIT/ML ~~LOC~~ SOLN
10.0000 [IU] | Freq: Every day | SUBCUTANEOUS | Status: DC
  Administered 2014-11-09 – 2014-11-18 (×9): 10 [IU] via SUBCUTANEOUS
  Filled 2014-11-09 (×13): qty 0.1

## 2014-11-09 MED ORDER — CINACALCET HCL 30 MG PO TABS
30.0000 mg | ORAL_TABLET | Freq: Every day | ORAL | Status: DC
  Administered 2014-11-10 – 2014-11-15 (×6): 30 mg via ORAL
  Filled 2014-11-09 (×8): qty 1

## 2014-11-09 MED ORDER — TRAZODONE HCL 50 MG PO TABS
50.0000 mg | ORAL_TABLET | Freq: Every day | ORAL | Status: DC
  Administered 2014-11-09 – 2014-11-18 (×10): 50 mg via ORAL
  Filled 2014-11-09 (×11): qty 1

## 2014-11-09 MED ORDER — WARFARIN 0.5 MG HALF TABLET: 0.5000 mg | ORAL_TABLET | Freq: Every day | ORAL | Status: DC

## 2014-11-09 MED ORDER — SODIUM CHLORIDE 0.9 % IJ SOLN
3.0000 mL | Freq: Two times a day (BID) | INTRAMUSCULAR | Status: DC
  Administered 2014-11-09 – 2014-11-18 (×11): 3 mL via INTRAVENOUS

## 2014-11-09 MED ORDER — FOLIC ACID 1 MG PO TABS
1.0000 mg | ORAL_TABLET | Freq: Every day | ORAL | Status: DC
  Administered 2014-11-11: 1 mg via ORAL
  Filled 2014-11-09 (×2): qty 1

## 2014-11-09 MED ORDER — WARFARIN SODIUM 2 MG PO TABS
2.0000 mg | ORAL_TABLET | Freq: Once | ORAL | Status: DC
Start: 2014-11-09 — End: 2014-11-10
  Filled 2014-11-09: qty 1

## 2014-11-09 MED ORDER — METOPROLOL TARTRATE 25 MG PO TABS
75.0000 mg | ORAL_TABLET | Freq: Two times a day (BID) | ORAL | Status: DC
  Administered 2014-11-09 – 2014-11-11 (×3): 75 mg via ORAL
  Filled 2014-11-09 (×5): qty 1

## 2014-11-09 MED ORDER — ONDANSETRON HCL 4 MG/2ML IJ SOLN: 4.0000 mg | Freq: Four times a day (QID) | INTRAMUSCULAR | Status: DC | PRN

## 2014-11-09 MED ORDER — FLUOXETINE HCL 20 MG PO CAPS
20.0000 mg | ORAL_CAPSULE | Freq: Every day | ORAL | Status: DC
  Administered 2014-11-10 – 2014-11-19 (×9): 20 mg via ORAL
  Filled 2014-11-09 (×10): qty 1

## 2014-11-09 MED ORDER — MIDODRINE HCL 5 MG PO TABS
10.0000 mg | ORAL_TABLET | Freq: Two times a day (BID) | ORAL | Status: DC
  Administered 2014-11-09 – 2014-11-19 (×17): 10 mg via ORAL
  Filled 2014-11-09 (×22): qty 2

## 2014-11-09 MED ORDER — INSULIN ASPART 100 UNIT/ML ~~LOC~~ SOLN
0.0000 [IU] | Freq: Three times a day (TID) | SUBCUTANEOUS | Status: DC
  Administered 2014-11-11: 1 [IU] via SUBCUTANEOUS
  Administered 2014-11-11: 2 [IU] via SUBCUTANEOUS
  Administered 2014-11-17 – 2014-11-18 (×3): 1 [IU] via SUBCUTANEOUS

## 2014-11-09 MED ORDER — WARFARIN - PHARMACIST DOSING INPATIENT: Status: DC

## 2014-11-09 NOTE — H&P (Addendum)
[facility] History and Physical  MEILECH VIRTS YJE:[id number] DOB: 05/20/1951 DOA: 11/09/2014  Referring physician: ER PCP: [name], MD   Chief Complaint: Dyspnea  HPI: [name] [name] is a 64 y.o. male  This is a 64 year old man who presents with a three-day history of increasing shortness of breath. He has a cough productive mainly of white sputum but occasionally yellow sputum. He denies any fever. There is no nausea, vomiting, abdominal pain. He has end-stage renal disease on hemodialysis and gets dialysis on Tuesdays, Thursdays and Saturdays. Evaluation in the emergency room showed him to have a large loculated right-sided pleural effusion. In December of last year he was admitted with what appeared to be a loculated effusion also on the right side associated with pneumonia. At that time he had thoracentesis and was treated with antibiotics. He did well after this. He now returns with a recurrent right pleural effusion. He is going to be admitted for further management.   Review of Systems:  Apart from symptoms above, all systems negative.  Past Medical History  Diagnosis Date  . UGI bleed 02/12/11    On anticoagulation; EGD w/snare polypectomy-multiple polypoid lesions antrum(benign), Chronic active gastritis (NEGATIVE H pylori)  . Anemia, chronic disease   . Sepsis due to enterococcus 02/11/11  . Atrial fibrillation     On coumadin  . Coronary atherosclerosis of native coronary artery     Multivessel status post CABG  . Cardiomyopathy   . Type 2 diabetes mellitus   . Essential hypertension, benign   . Hyperlipemia   . Gout   . S/P patent foramen ovale closure   . Hyperbilirubinemia 08/10/2013  . SBO (small bowel obstruction) 01/2014  . 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 Clostridium difficile colitis 01/2014  . Diverticulosis   . Internal hemorrhoids   . ESRD on hemodialysis     Started 2008-2009. Dr Hinda Lenis on a TTS schedule - R forearm AVF for several years      Past Surgical History  Procedure Laterality Date  . Mitral valve replacement  01/05/2007    Bioprosthetic 29mm Edwards precordial tissue (Dr. Servando Snare)  . Coronary artery bypass graft  01/05/2007    LIMA-LAD, SVG-OM, seq. SVG-PDA, PLA-RCA  . Colonoscopy  06/23/2011    Dr. Oneida Alar: multiple sessile and pedunculated polyps, moderate diverticulosis, internal hemorrhoids, +ADENOMATOUS POLYPS, consider genetic testing, surveillance in October 2015   . Esophagogastroduodenoscopy  02/12/2011    CHRONIC ACTIVE GASTRITIS, NO H. pylori  . Transthoracic echocardiogram  11/2011    EF 25% w/ mod dilatation of LV & mod LVH; LA severely dilated; mod-severe dilation of RV with mod-severe decrease in RV function; mild MR & TR  . Cardioversion  04/19/2007    Dr. Marella Chimes  . Transthoracic echocardiogram  02/14/2011    EF 40-45%, mod conc LVH; ventricular septum with motion showing abnormal function, dyssynergy, paradox; mild AS; mild-mod MR stenosis; LA severely dilated; aptrial septum bowed from L to R with increased LA pressure   . Amputation Right 07/14/2013    Procedure: AMPUTATION DIGIT;  Surgeon: [name], MD;  Location: [facility];  Service: General;  Laterality: Right;  . Amputation Right 08/08/2013    Procedure: AMPUTATION BELOW KNEE;  Surgeon: [name], MD;  Location: [facility];  Service: General;  Laterality: Right;  . Stump revision Right 09/19/2013    Procedure: STUMP REVISION;  Surgeon: Scherry Ran, MD;  Location: [facility];  Service: General;  Laterality: Right;  .  Amputation Right 10/17/2013    Procedure: AMPUTATION ABOVE KNEE;  Surgeon: [name], MD;  Location: [facility];  Service: General;  Laterality: Right;  . Colonoscopy N/A 04/10/2014    Procedure: COLONOSCOPY;  Surgeon: [name], MD;  Location: [facility];  Service: Endoscopy;  Laterality: N/A;  830  . Tee without cardioversion N/A 08/19/2014    Procedure: TRANSESOPHAGEAL ECHOCARDIOGRAM (TEE);  Surgeon: [name], MD;  Location: [facility] ENDOSCOPY;  Service: Cardiovascular;  Laterality: N/A;  . Cardioversion N/A 08/19/2014    Procedure: CARDIOVERSION;  Surgeon: [name], MD;  Location: [facility] ENDOSCOPY;  Service: Cardiovascular;  Laterality: N/A;   Social History:  reports that he quit smoking about 7 years ago. His smoking use included Cigarettes. He started smoking about 45 years ago. He has a 8 pack-year smoking history. He has never used smokeless tobacco. He reports that he does not drink alcohol or use illicit drugs.  Allergies  Allergen Reactions  . Bacitracin Other (See Comments)    unknown  . Norvasc [Amlodipine Besylate] Other (See Comments)    unknown    Family History  Problem Relation Age of Onset  . Diabetes Mother   . Diabetes Father   . Diabetes Sister   . Colon cancer Neg Hx   . Colon polyps Neg Hx     Prior to Admission medications   Medication Sig Start Date End Date Taking? Authorizing Provider  albuterol (PROVENTIL) (2.5 MG/3ML) 0.083% nebulizer solution Take 3 mLs (2.5 mg total) by nebulization every 2 (two) hours as needed for wheezing. 02/21/14  Yes Kinnie Feil, MD  allopurinol (ZYLOPRIM) 100 MG tablet Take 100 mg by mouth daily.  05/15/11  Yes Historical Provider, MD  amiodarone (PACERONE) 200 MG tablet Take 1 tablet (200 mg total) by mouth daily. 08/31/14  Yes Satira Sark, MD  B Complex-C-Zn-Folic Acid (DIALYVITE/ZINC PO) Take by mouth 3 (three) times a week. Patient has dialysis on Tuesdays, Thursdays, and Saturdays   Yes Historical Provider, MD  FLUoxetine (PROZAC) 20 MG capsule Take 20 mg by mouth daily. 09/21/14  Yes Historical Provider, MD  folic acid (FOLVITE) 1 MG tablet Take 1 mg by mouth daily.     Yes Historical Provider, MD  insulin glargine (LANTUS) 100 UNIT/ML injection Inject 10 Units into the skin at bedtime.   Yes Historical Provider, MD  metoprolol (LOPRESSOR) 50 MG tablet Take 75 mg by mouth 2 (two) times daily. 07/04/14  Yes Historical  Provider, MD  midodrine (PROAMATINE) 10 MG tablet Take 1 tablet (10 mg total) by mouth 2 (two) times daily with a meal. 08/20/14  Yes Silver Huguenin Elgergawy, MD  pantoprazole (PROTONIX) 40 MG tablet Take 40 mg by mouth 2 (two) times daily.  05/25/11  Yes Historical Provider, MD  RENVELA 800 MG tablet Take 800 mg by mouth 3 (three) times daily with meals.  05/03/11  Yes Historical Provider, MD  SENSIPAR 30 MG tablet Take 30 mg by mouth daily. 01/22/14  Yes Historical Provider, MD  traMADol (ULTRAM) 50 MG tablet Take by mouth every 8 (eight) hours as needed for moderate pain.   Yes Historical Provider, MD  traZODone (DESYREL) 50 MG tablet Take 1 tablet (50 mg total) by mouth at bedtime. 08/13/13  Yes Nita Sells, MD  warfarin (COUMADIN) 1 MG tablet Take 1/2 tablet daily Patient taking differently: Take 1/2 tablet qhs 09/08/14  Yes Satira Sark, MD  HYDROcodone-acetaminophen (NORCO/VICODIN) 5-325 MG per tablet Take 1 tablet  by mouth every 6 (six) hours as needed for moderate pain. 08/06/14   Carole Civil, MD   Physical Exam: Filed Vitals:   11/09/14 1303 11/09/14 1345 11/09/14 1400 11/09/14 1434  BP: 126/91 132/84 140/71 143/65  Pulse: 70 77 76 77  Temp:      TempSrc:      Resp: 20 18  18   Height:      Weight:      SpO2: 95% 95% 94% 94%    Wt Readings from Last 3 Encounters:  11/09/14 90.719 kg (200 lb)  10/19/14 88.905 kg (196 lb)  09/29/14 113.399 kg (250 lb)    General:  Appears calm and comfortable. There is no increased work of breathing. He is not toxic or septic. Eyes: PERRL, normal lids, irises & conjunctiva ENT: grossly normal hearing, lips & tongue Neck: no LAD, masses or thyromegaly Cardiovascular: Irregularly irregular, consistent with atrial fibrillation. He is not clinically in heart failure. Telemetry: Atrial fibrillation. Ventricular rate is controlled. Respiratory: Increased dullness and reduced air entry on the right mid and lower zones. There does appear  to be some bronchial breathing on this side also. Abdomen: soft, ntnd Skin: no rash or induration seen on limited exam Musculoskeletal: grossly normal tone BUE/BLE Psychiatric: grossly normal mood and affect, speech fluent and appropriate Neurologic: grossly non-focal.          Labs on Admission:  Basic Metabolic Panel:  Recent Labs Lab 11/09/14 1055  NA 140  K 4.4  CL 100  CO2 28  GLUCOSE 92  BUN 30*  CREATININE 6.85*  CALCIUM 7.8*   Liver Function Tests: No results for input(s): AST, ALT, ALKPHOS, BILITOT, PROT, ALBUMIN in the last 168 hours. No results for input(s): LIPASE, AMYLASE in the last 168 hours. No results for input(s): AMMONIA in the last 168 hours. CBC:  Recent Labs Lab 11/09/14 1055  WBC 7.9  NEUTROABS 6.1  HGB 10.0*  HCT 33.0*  MCV 94.0  PLT 238   Cardiac Enzymes:  Recent Labs Lab 11/09/14 1055  TROPONINI <0.03    BNP (last 3 results) No results for input(s): BNP in the last 8760 hours.  ProBNP (last 3 results)  Recent Labs  08/15/14 0245  PROBNP >70000.0*    CBG: No results for input(s): GLUCAP in the last 168 hours.  Radiological Exams on Admission: Dg Chest 2 View  11/09/2014   CLINICAL DATA:  Shortness of breath  EXAM: CHEST  2 VIEW  COMPARISON:  11/09/2014  FINDINGS: Unchanged cardiomegaly. Aortic and hilar contours or stable from prior. Patient is status post CABG and mitral valve replacement.  No interstitial coarsening with improved lung volumes. There is a moderate right pleural effusion with posterior loculation. The appearance is stable from two-view chest x-ray 08/13/2014, a peripherally enhancing loculated effusion based on abdominal CT 08/13/2014. Prominent right hilar opacity persists without evidence of hilar adenopathy or mass in the lateral projection. This appearance is likely from the pleural fluid.  Remote L1 compression fracture with advanced height loss. No acute osseous findings.  IMPRESSION: 1. Chronic, loculated  right pleural effusion with atelectasis. 2. No evidence for pulmonary edema on this better inflated study.   Electronically Signed   By: Monte Fantasia M.D.   On: 11/09/2014 10:52   Ct Chest W Contrast  11/09/2014   CLINICAL DATA:  Shortness of breath.  Productive cough.  EXAM: CT CHEST WITH CONTRAST  TECHNIQUE: Multidetector CT imaging of the chest was performed during intravenous contrast administration.  CONTRAST:  40mL OMNIPAQUE IOHEXOL 300 MG/ML  SOLN  COMPARISON:  12/06/2011  FINDINGS: THORACIC INLET/BODY WALL:  No acute abnormality.  MEDIASTINUM:  Stable cardiomegaly, with especially prominent left atrial enlargement. The patient is status post mitral valve replacement and CABG. Portions of the LIMA and venous grafts which are large enough to evaluate are enhancing. No aortic aneurysm or dissection. No evidence of pulmonary embolism. Adenopathy stable to decreased from 2013, with right lower peritracheal and subcarinal nodes remaining the most prominent. A 15 mm metallic body (with the appearance of stent) within a right upper lobe pulmonary artery is stable from previous. There is no associated vessel occlusion.  LUNG WINDOWS:  Chronic loculated pleural effusion in the posterior and lower right chest with thick enhancing rim. The fluid collection is large, measuring 18 cm in craniocaudal span by 10 cm in thickness. The appearance is stable compared to visible portions on abdominal CT 08/13/2014. Based on abdominal CT favor remote hemothorax. Opacification along the chronic pleural effusion has heterogeneous enhancement, swirling interstitium, and subpleural rounded morphology. This is predominantly within the right lower lobe, although also present along the posterior right upper lobe and right middle lobe. There is small, loculated pleural effusion in the anterior and upper right hemi thorax without pleural nodularity or enhancement in this location. Trace layering left pleural effusion. Stable left  lower lobe pulmonary nodule at 6 mm. Given stability from 2013 this is considered benign. Mild apical emphysema and air trapping.  UPPER ABDOMEN:  Hepatic fissure enlargement and subtle liver surface nodularity could reflect early cirrhosis. Cholelithiasis without evidence of acute cholecystitis.  OSSEOUS:  Remote L1 compression fracture with greater than 75% height loss. Remote left posterior rib fractures.  IMPRESSION: 1. Chronic large and complex right pleural effusion, remote hemothorax based on abdominal CT 02/27/2014. 2. Collapsed and distorted right lower lobe secondary to #1, likely with developing rounded atelectasis. Cannot exclude superimposed infection. 3. Indeterminate, small loculated pleural effusion in the right upper chest. 4. Multiple incidental findings are stable from 2013 and noted above.   Electronically Signed   By: Monte Fantasia M.D.   On: 11/09/2014 12:47   Dg Chest Portable 1 View  11/09/2014   CLINICAL DATA:  Shortness of breath for 1 week.  Dialysis.  EXAM: PORTABLE CHEST - 1 VIEW  COMPARISON:  08/16/2014  FINDINGS: There is chronic cardiomegaly post CABG and mitral valve replacement. Aortic contours are stable from prior. Pulmonary venous congestion and mild pulmonary edema with a large right pleural effusion. New circumscribed opacity in the right hilum, likely fissural fluid.  IMPRESSION: 1. Pulmonary edema with chronic moderate right pleural effusion. 2. A right hilar opacity is likely fissural fluid. When clinically able recommend two-view chest x-ray.   Electronically Signed   By: Monte Fantasia M.D.   On: 11/09/2014 10:01    EKG: Independently reviewed. Atrial fibrillation. No acute ST-T wave changes.  Assessment/Plan   1. Recurrent right pleural effusion. The size is moderate to large. He will need probable thoracentesis again. We will ask thoracic surgery to consult on him. We will cover him with antibiotics empirically. 2. Chronic atrial fibrillation on chronic  anticoagulations therapy. We will need to hold warfarin for the time being. 3. End-stage renal disease on hemodialysis. We will ask nephrology to see him. 4. Diabetes. Continue with home medications and sliding scale.  Further recommendations will depend on patient's hospital progress. We will transfer the patient to [facility]  hospital for further management. I have spoken  with my colleague Dr. Maryland Pink, who has accepted the patient in transfer.  Code Status: Full code.  DVT Prophylaxis: Warfarin.  Family Communication: I discussed the plan with the patient at the bedside.   Disposition Plan: Home when medically stable.   Time spent: 60 minutes.  Doree Albee [facility] Pager [phone].   I have spoken to the nurse for thoracic surgery and she will give information to Dr. Roxan Hockey to see the patient.

## 2014-11-09 NOTE — Progress Notes (Signed)
Report received from [facility] at 1800 and pt arrived to the room via stretcher by EMS at 1850. 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 Reported off to incoming RN. Francis Gaines Felice Hope RN.

## 2014-11-09 NOTE — ED Notes (Signed)
Pt c/o cough and sob x 1 week.  Reports cough is productive at times.  Denies fever, denies pain.

## 2014-11-09 NOTE — ED Notes (Signed)
New order requested for admission to [facility] by Dr. Anastasio Champion.

## 2014-11-09 NOTE — Progress Notes (Signed)
ANTICOAGULATION CONSULT NOTE - Initial Consult  Pharmacy Consult for Coumadin (chronic Rx PTA) Indication: atrial fibrillation  Allergies  Allergen Reactions  . Bacitracin Other (See Comments)    unknown  . Norvasc [Amlodipine Besylate] Other (See Comments)    unknown    Patient Measurements: Height: 6\' 4"  (193 cm) Weight: 200 lb (90.719 kg) IBW/kg (Calculated) : 86.8  Vital Signs: Temp: 98.2 F (36.8 C) (03/14 0947) Temp Source: Oral (03/14 0947) BP: 123/62 mmHg (03/14 1606) Pulse Rate: 83 (03/14 1606)  Labs:  Recent Labs  11/09/14 1055  HGB 10.0*  HCT 33.0*  PLT 238  LABPROT 16.4*  INR 1.31  CREATININE 6.85*  TROPONINI <0.03    Estimated Creatinine Clearance: 13.2 mL/min (by C-G formula based on Cr of 6.85).   Medical History: Past Medical History  Diagnosis Date  . UGI bleed 02/12/11    On anticoagulation; EGD w/snare polypectomy-multiple polypoid lesions antrum(benign), Chronic active gastritis (NEGATIVE H pylori)  . Anemia, chronic disease   . Sepsis due to enterococcus 02/11/11  . Atrial fibrillation     On coumadin  . Coronary atherosclerosis of native coronary artery     Multivessel status post CABG  . Cardiomyopathy   . Type 2 diabetes mellitus   . Essential hypertension, benign   . Hyperlipemia   . Gout   . S/P patent foramen ovale closure   . Hyperbilirubinemia 08/10/2013  . SBO (small bowel obstruction) 01/2014  . 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 Clostridium difficile colitis 01/2014  . Diverticulosis   . Internal hemorrhoids   . ESRD on hemodialysis     Started 2008-2009. Dr Hinda Lenis on a TTS schedule - R forearm AVF for several years      Medications:   (Not in a hospital admission)  Assessment: 64yo male on chronic Coumadin PTA for h/o afib.  INR is below goal on admission.  Dosing from Coumadin clinic noted below.  Anticoagulation Monitoring 10/19/2014  INR goal 2.0-3.0  Assoc. INR Date 10/19/2014  Associated INR 1.8  Pt.  deviation No  Current weekly dose 4.5 mg  Sunday dose 1 mg  Monday dose 1 mg  Tuesday dose 0.5 mg  Wednesday dose 1 mg  Thursday dose 0.5 mg  Friday dose 1 mg  Saturday dose 0.5 mg  Weekly dose 5.5 mg  Dose description DOSE CHANGE: . . .  Return date 11/09/2014  VISIT REPORT    Goal of Therapy:  INR 2-3 Monitor platelets by anticoagulation protocol: Yes   Plan:  Coumadin 2mg  po today x 1 (to boost INR) INR daily until stable.  [name] A 11/09/2014,4:33 PM

## 2014-11-09 NOTE — Progress Notes (Signed)
Attempted to get report from ED RN, she was unable to give report at this time.

## 2014-11-09 NOTE — ED Notes (Signed)
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 Dr Dina Rich made aware.

## 2014-11-09 NOTE — ED Notes (Signed)
Report given to [facility]  

## 2014-11-09 NOTE — ED Notes (Signed)
X2 unsuccessful IV attempts made by Domenica Reamer RN. Alinda Money RN to go to room and attempt IV.

## 2014-11-09 NOTE — ED Provider Notes (Signed)
CSN: [id number]     Arrival date & time 11/09/14  9735 History  This chart was scribed for Merryl Hacker, MD by Edison Simon, ED Scribe. This patient was seen in room APA01/APA01 and the patient's care was started at 9:48 AM.    Chief Complaint  Patient presents with  . Shortness of Breath   The history is provided by the patient. No language interpreter was used.    HPI Comments: HARU SHAFF is a 64 y.o. male who presents to the Emergency Department complaining of SOB with onset 1 week ago. He states he has not had similar symptoms before. He states it is worse when laying flat and with exertion. He reports associated cough that produces white phlegm. He has not seen his PCP. He is on dialysis on Tuesday Thursday Saturday, last 2 days ago. He denies chest pain, fever, abdomina pain, nausea, vomiting, or diarrhea.  Of note, patient admitted and December and found to have loculated right-sided pleural effusion requiring drainage.  Past Medical History  Diagnosis Date  . UGI bleed 02/12/11    On anticoagulation; EGD w/snare polypectomy-multiple polypoid lesions antrum(benign), Chronic active gastritis (NEGATIVE H pylori)  . Anemia, chronic disease   . Sepsis due to enterococcus 02/11/11  . Atrial fibrillation     On coumadin  . Coronary atherosclerosis of native coronary artery     Multivessel status post CABG  . Cardiomyopathy   . Type 2 diabetes mellitus   . Essential hypertension, benign   . Hyperlipemia   . Gout   . S/P patent foramen ovale closure   . Hyperbilirubinemia 08/10/2013  . SBO (small bowel obstruction) 01/2014  . 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 Clostridium difficile colitis 01/2014  . Diverticulosis   . Internal hemorrhoids   . ESRD on hemodialysis     Started 2008-2009. Dr Hinda Lenis on a TTS schedule - R forearm AVF for several years     Past Surgical History  Procedure Laterality Date  . Mitral valve replacement  01/05/2007    Bioprosthetic 51mm Edwards  precordial tissue (Dr. Servando Snare)  . Coronary artery bypass graft  01/05/2007    LIMA-LAD, SVG-OM, seq. SVG-PDA, PLA-RCA  . Colonoscopy  06/23/2011    Dr. Oneida Alar: multiple sessile and pedunculated polyps, moderate diverticulosis, internal hemorrhoids, +ADENOMATOUS POLYPS, consider genetic testing, surveillance in October 2015   . Esophagogastroduodenoscopy  02/12/2011    CHRONIC ACTIVE GASTRITIS, NO H. pylori  . Transthoracic echocardiogram  11/2011    EF 25% w/ mod dilatation of LV & mod LVH; LA severely dilated; mod-severe dilation of RV with mod-severe decrease in RV function; mild MR & TR  . Cardioversion  04/19/2007    Dr. Marella Chimes  . Transthoracic echocardiogram  02/14/2011    EF 40-45%, mod conc LVH; ventricular septum with motion showing abnormal function, dyssynergy, paradox; mild AS; mild-mod MR stenosis; LA severely dilated; aptrial septum bowed from L to R with increased LA pressure   . Amputation Right 07/14/2013    Procedure: AMPUTATION DIGIT;  Surgeon: [name], MD;  Location: [facility];  Service: General;  Laterality: Right;  . Amputation Right 08/08/2013    Procedure: AMPUTATION BELOW KNEE;  Surgeon: [name], MD;  Location: [facility];  Service: General;  Laterality: Right;  . Stump revision Right 09/19/2013    Procedure: STUMP REVISION;  Surgeon: Scherry Ran, MD;  Location: [facility];  Service: General;  Laterality: Right;  . Amputation Right 10/17/2013  Procedure: AMPUTATION ABOVE KNEE;  Surgeon: [name], MD;  Location: [facility];  Service: General;  Laterality: Right;  . Colonoscopy N/A 04/10/2014    Procedure: COLONOSCOPY;  Surgeon: [name], MD;  Location: [facility];  Service: Endoscopy;  Laterality: N/A;  830  . Tee without cardioversion N/A 08/19/2014    Procedure: TRANSESOPHAGEAL ECHOCARDIOGRAM (TEE);  Surgeon: [name], MD;  Location: [facility] ENDOSCOPY;  Service: Cardiovascular;  Laterality: N/A;  . Cardioversion N/A 08/19/2014    Procedure:  CARDIOVERSION;  Surgeon: [name], MD;  Location: [facility] ENDOSCOPY;  Service: Cardiovascular;  Laterality: N/A;   Family History  Problem Relation Age of Onset  . Diabetes Mother   . Diabetes Father   . Diabetes Sister   . Colon cancer Neg Hx   . Colon polyps Neg Hx    History  Substance Use Topics  . Smoking status: Former Smoker -- 1.00 packs/day for 8 years    Types: Cigarettes    Start date: 04/27/1969    Quit date: 11/27/2006  . Smokeless tobacco: Never Used     Comment: quit about 5 yrs  . Alcohol Use: No    Review of Systems  Constitutional: Negative.  Negative for fever.  Respiratory: Positive for cough and shortness of breath. Negative for chest tightness and wheezing.   Cardiovascular: Negative.  Negative for chest pain.  Gastrointestinal: Negative.  Negative for abdominal pain.  Genitourinary: Negative.  Negative for dysuria.  Musculoskeletal: Negative for back pain.  Neurological: Negative for headaches.  All other systems reviewed and are negative.     Allergies  Bacitracin and Norvasc  Home Medications   Prior to Admission medications   Medication Sig Start Date End Date Taking? Authorizing Provider  albuterol (PROVENTIL) (2.5 MG/3ML) 0.083% nebulizer solution Take 3 mLs (2.5 mg total) by nebulization every 2 (two) hours as needed for wheezing. 02/21/14  Yes Kinnie Feil, MD  allopurinol (ZYLOPRIM) 100 MG tablet Take 100 mg by mouth daily.  05/15/11  Yes Historical Provider, MD  amiodarone (PACERONE) 200 MG tablet Take 1 tablet (200 mg total) by mouth daily. 08/31/14  Yes Satira Sark, MD  B Complex-C-Zn-Folic Acid (DIALYVITE/ZINC PO) Take by mouth 3 (three) times a week. Patient has dialysis on Tuesdays, Thursdays, and Saturdays   Yes Historical Provider, MD  FLUoxetine (PROZAC) 20 MG capsule Take 20 mg by mouth daily. 09/21/14  Yes Historical Provider, MD  folic acid (FOLVITE) 1 MG tablet Take 1 mg by mouth daily.     Yes Historical Provider, MD   insulin glargine (LANTUS) 100 UNIT/ML injection Inject 10 Units into the skin at bedtime.   Yes Historical Provider, MD  metoprolol (LOPRESSOR) 50 MG tablet Take 75 mg by mouth 2 (two) times daily. 07/04/14  Yes Historical Provider, MD  midodrine (PROAMATINE) 10 MG tablet Take 1 tablet (10 mg total) by mouth 2 (two) times daily with a meal. 08/20/14  Yes Silver Huguenin Elgergawy, MD  pantoprazole (PROTONIX) 40 MG tablet Take 40 mg by mouth 2 (two) times daily.  05/25/11  Yes Historical Provider, MD  RENVELA 800 MG tablet Take 800 mg by mouth 3 (three) times daily with meals.  05/03/11  Yes Historical Provider, MD  SENSIPAR 30 MG tablet Take 30 mg by mouth daily. 01/22/14  Yes Historical Provider, MD  traMADol (ULTRAM) 50 MG tablet Take by mouth every 8 (eight) hours as needed for moderate pain.   Yes Historical Provider, MD  traZODone (DESYREL)  50 MG tablet Take 1 tablet (50 mg total) by mouth at bedtime. 08/13/13  Yes Nita Sells, MD  warfarin (COUMADIN) 1 MG tablet Take 1/2 tablet daily Patient taking differently: Take 1/2 tablet qhs 09/08/14  Yes Satira Sark, MD  HYDROcodone-acetaminophen (NORCO/VICODIN) 5-325 MG per tablet Take 1 tablet by mouth every 6 (six) hours as needed for moderate pain. Patient not taking: Reported on 11/09/2014 08/06/14   Carole Civil, MD   BP 126/91 mmHg  Pulse 70  Temp(Src) 98.2 F (36.8 C) (Oral)  Resp 20  Ht 6\' 4"  (1.93 m)  Wt 200 lb (90.719 kg)  BMI 24.35 kg/m2  SpO2 95% Physical Exam  Constitutional: He is oriented to person, place, and time. No distress.  HENT:  Head: Normocephalic and atraumatic.  Mouth/Throat: Oropharynx is clear and moist.  Eyes: Pupils are equal, round, and reactive to light.  Neck: Neck supple.  Cardiovascular: Normal rate and normal heart sounds.   No murmur heard. Irregular rhythm  Pulmonary/Chest: Effort normal. No respiratory distress. He has no wheezes.  Decreased breath sounds right lower lobe  Abdominal: Soft.  Bowel sounds are normal. There is no tenderness. There is no rebound.  Musculoskeletal: He exhibits no edema.  Right AKA  Neurological: He is alert and oriented to person, place, and time.  Skin: Skin is warm and dry.  Psychiatric: He has a normal mood and affect.  Nursing note and vitals reviewed.   ED Course  Procedures (including critical care time)  DIAGNOSTIC STUDIES: Oxygen Saturation is 99% on room air, normal by my interpretation.    COORDINATION OF CARE: 9:50 AM Discussed treatment plan with patient at beside, the patient agrees with the plan and has no further questions at this time.   Labs Review Labs Reviewed  CBC WITH DIFFERENTIAL/PLATELET - Abnormal; Notable for the following:    RBC 3.51 (*)    Hemoglobin 10.0 (*)    HCT 33.0 (*)    RDW 16.8 (*)    Lymphocytes Relative 11 (*)    All other components within normal limits  BASIC METABOLIC PANEL - Abnormal; Notable for the following:    BUN 30 (*)    Creatinine, Ser 6.85 (*)    Calcium 7.8 (*)    GFR calc non Af Amer 8 (*)    GFR calc Af Amer 9 (*)    All other components within normal limits  PROTIME-INR - Abnormal; Notable for the following:    Prothrombin Time 16.4 (*)    All other components within normal limits  TROPONIN I    Imaging Review Dg Chest 2 View  11/09/2014   CLINICAL DATA:  Shortness of breath  EXAM: CHEST  2 VIEW  COMPARISON:  11/09/2014  FINDINGS: Unchanged cardiomegaly. Aortic and hilar contours or stable from prior. Patient is status post CABG and mitral valve replacement.  No interstitial coarsening with improved lung volumes. There is a moderate right pleural effusion with posterior loculation. The appearance is stable from two-view chest x-ray 08/13/2014, a peripherally enhancing loculated effusion based on abdominal CT 08/13/2014. Prominent right hilar opacity persists without evidence of hilar adenopathy or mass in the lateral projection. This appearance is likely from the pleural fluid.   Remote L1 compression fracture with advanced height loss. No acute osseous findings.  IMPRESSION: 1. Chronic, loculated right pleural effusion with atelectasis. 2. No evidence for pulmonary edema on this better inflated study.   Electronically Signed   By: Neva Seat.D.  On: 11/09/2014 10:52   Ct Chest W Contrast  11/09/2014   CLINICAL DATA:  Shortness of breath.  Productive cough.  EXAM: CT CHEST WITH CONTRAST  TECHNIQUE: Multidetector CT imaging of the chest was performed during intravenous contrast administration.  CONTRAST:  49mL OMNIPAQUE IOHEXOL 300 MG/ML  SOLN  COMPARISON:  12/06/2011  FINDINGS: THORACIC INLET/BODY WALL:  No acute abnormality.  MEDIASTINUM:  Stable cardiomegaly, with especially prominent left atrial enlargement. The patient is status post mitral valve replacement and CABG. Portions of the LIMA and venous grafts which are large enough to evaluate are enhancing. No aortic aneurysm or dissection. No evidence of pulmonary embolism. Adenopathy stable to decreased from 2013, with right lower peritracheal and subcarinal nodes remaining the most prominent. A 15 mm metallic body (with the appearance of stent) within a right upper lobe pulmonary artery is stable from previous. There is no associated vessel occlusion.  LUNG WINDOWS:  Chronic loculated pleural effusion in the posterior and lower right chest with thick enhancing rim. The fluid collection is large, measuring 18 cm in craniocaudal span by 10 cm in thickness. The appearance is stable compared to visible portions on abdominal CT 08/13/2014. Based on abdominal CT favor remote hemothorax. Opacification along the chronic pleural effusion has heterogeneous enhancement, swirling interstitium, and subpleural rounded morphology. This is predominantly within the right lower lobe, although also present along the posterior right upper lobe and right middle lobe. There is small, loculated pleural effusion in the anterior and upper right hemi  thorax without pleural nodularity or enhancement in this location. Trace layering left pleural effusion. Stable left lower lobe pulmonary nodule at 6 mm. Given stability from 2013 this is considered benign. Mild apical emphysema and air trapping.  UPPER ABDOMEN:  Hepatic fissure enlargement and subtle liver surface nodularity could reflect early cirrhosis. Cholelithiasis without evidence of acute cholecystitis.  OSSEOUS:  Remote L1 compression fracture with greater than 75% height loss. Remote left posterior rib fractures.  IMPRESSION: 1. Chronic large and complex right pleural effusion, remote hemothorax based on abdominal CT 02/27/2014. 2. Collapsed and distorted right lower lobe secondary to #1, likely with developing rounded atelectasis. Cannot exclude superimposed infection. 3. Indeterminate, small loculated pleural effusion in the right upper chest. 4. Multiple incidental findings are stable from 2013 and noted above.   Electronically Signed   By: Monte Fantasia M.D.   On: 11/09/2014 12:47   Dg Chest Portable 1 View  11/09/2014   CLINICAL DATA:  Shortness of breath for 1 week.  Dialysis.  EXAM: PORTABLE CHEST - 1 VIEW  COMPARISON:  08/16/2014  FINDINGS: There is chronic cardiomegaly post CABG and mitral valve replacement. Aortic contours are stable from prior. Pulmonary venous congestion and mild pulmonary edema with a large right pleural effusion. New circumscribed opacity in the right hilum, likely fissural fluid.  IMPRESSION: 1. Pulmonary edema with chronic moderate right pleural effusion. 2. A right hilar opacity is likely fissural fluid. When clinically able recommend two-view chest x-ray.   Electronically Signed   By: Monte Fantasia M.D.   On: 11/09/2014 10:01     EKG Interpretation   Date/Time:  Monday November 09 2014 10:04:08 EDT Ventricular Rate:  65 PR Interval:    QRS Duration: 111 QT Interval:  487 QTC Calculation: 506 R Axis:   3 Text Interpretation:  Atrial fibrillation  Ventricular premature complex  Anterior infarct, old Nonspecific T abnormalities, lateral leads Prolonged  QT interval Similar to prior Confirmed by HORTON  MD, COURTNEY ([id number]) on  11/09/2014 10:14:16 AM      MDM   Final diagnoses:  SOB (shortness of breath)  Pleural effusion    This is a 64 year old male with a history of loculated pleural effusion who presents with shortness of breath. Nontoxic on exam. Vital signs are reassuring. Afebrile. Decreased breath sounds on the right. Initial workup notable for chest x-ray with persistent right-sided pleural effusion. No evidence of leukocytosis. Patient is in rate controlled atrial fibrillation. CT scan of the chest obtained to better characterize pleural effusion. CT scan shows chronic large complex right pleural effusion. Unable to ambulate patient as he does not have his prosthetic device he is satting 94% on room air. Given the size of the pleural effusion, feel patient may benefit from drainage. He has had a productive cough but no fever. Will discuss empiric antibiotics with admitting hospitalist given history of MRSA  I personally performed the services described in this documentation, which was scribed in my presence. The recorded information has been reviewed and is accurate.   Merryl Hacker, MD 11/09/14 [phone]

## 2014-11-10 ENCOUNTER — Encounter (HOSPITAL_COMMUNITY)
Admission: EM | Disposition: A | Discharge status: Home Health | Payer: Self-pay | Source: Home / Self Care | Attending: Internal Medicine

## 2014-11-10 ENCOUNTER — Ambulatory Visit (HOSPITAL_COMMUNITY): Payer: Medicare Other

## 2014-11-10 DIAGNOSIS — J90 Pleural effusion, not elsewhere classified: Secondary | ICD-10-CM | POA: Insufficient documentation

## 2014-11-10 DIAGNOSIS — I482 Chronic atrial fibrillation: Secondary | ICD-10-CM

## 2014-11-10 LAB — RENAL FUNCTION PANEL
Albumin: 2.4 g/dL — ABNORMAL LOW (ref 3.5–5.2)
Anion gap: 18 — ABNORMAL HIGH (ref 5–15)
BUN: 42 mg/dL — ABNORMAL HIGH (ref 6–23)
CO2: 21 mmol/L (ref 19–32)
CREATININE: 8.35 mg/dL — AB (ref 0.50–1.35)
Calcium: 7.7 mg/dL — ABNORMAL LOW (ref 8.4–10.5)
Chloride: 97 mmol/L (ref 96–112)
GFR calc Af Amer: 7 mL/min — ABNORMAL LOW (ref >90)
GFR calc non Af Amer: 6 mL/min — ABNORMAL LOW (ref >90)
GLUCOSE: 141 mg/dL — AB (ref 70–99)
PHOSPHORUS: 5.3 mg/dL — AB (ref 2.3–4.6)
Potassium: 5.8 mmol/L — ABNORMAL HIGH (ref 3.5–5.1)
Sodium: 136 mmol/L (ref 135–145)

## 2014-11-10 LAB — CBC
HCT: 33 % — ABNORMAL LOW (ref 39.0–52.0)
Hemoglobin: 10.2 g/dL — ABNORMAL LOW (ref 13.0–17.0)
MCH: 27.9 pg (ref 26.0–34.0)
MCHC: 30.9 g/dL (ref 30.0–36.0)
MCV: 90.2 fL (ref 78.0–100.0)
Platelets: 295 10*3/uL (ref 150–400)
RBC: 3.66 MIL/uL — ABNORMAL LOW (ref 4.22–5.81)
RDW: 16.4 % — ABNORMAL HIGH (ref 11.5–15.5)
WBC: 9.2 10*3/uL (ref 4.0–10.5)

## 2014-11-10 LAB — GLUCOSE, CAPILLARY
Glucose-Capillary: 126 mg/dL — ABNORMAL HIGH (ref 70–99)
Glucose-Capillary: 111 mg/dL — ABNORMAL HIGH (ref 70–99)
Glucose-Capillary: 106 mg/dL — ABNORMAL HIGH (ref 70–99)
Glucose-Capillary: 128 mg/dL — ABNORMAL HIGH (ref 70–99)

## 2014-11-10 LAB — HEPATITIS B SURFACE ANTIGEN: HEP B S AG: NEGATIVE

## 2014-11-10 SURGERY — BRONCHOSCOPY, VIDEO-ASSISTED
Anesthesia: General | Site: Chest | Laterality: Right

## 2014-11-10 MED ORDER — CHLORHEXIDINE GLUCONATE CLOTH 2 % EX PADS
6.0000 | MEDICATED_PAD | Freq: Every day | CUTANEOUS | Status: AC
  Administered 2014-11-11 – 2014-11-15 (×5): 6 via TOPICAL

## 2014-11-10 MED ORDER — MIDAZOLAM HCL 2 MG/2ML IJ SOLN
INTRAMUSCULAR | Status: AC
  Filled 2014-11-10: qty 2

## 2014-11-10 MED ORDER — DARBEPOETIN ALFA 60 MCG/0.3ML IJ SOSY
60.0000 ug | PREFILLED_SYRINGE | INTRAMUSCULAR | Status: DC
Start: 2014-11-12 — End: 2014-11-15
  Administered 2014-11-12: 60 ug via INTRAVENOUS
  Filled 2014-11-10: qty 0.3

## 2014-11-10 MED ORDER — VANCOMYCIN HCL IN DEXTROSE 1-5 GM/200ML-% IV SOLN
1000.0000 mg | INTRAVENOUS | Status: DC
  Administered 2014-11-14: 1000 mg via INTRAVENOUS
  Filled 2014-11-10 (×4): qty 200

## 2014-11-10 MED ORDER — HEPARIN SODIUM (PORCINE) 5000 UNIT/ML IJ SOLN
5000.0000 [IU] | Freq: Three times a day (TID) | INTRAMUSCULAR | Status: DC
  Administered 2014-11-11: 5000 [IU] via SUBCUTANEOUS
  Filled 2014-11-10 (×5): qty 1

## 2014-11-10 MED ORDER — PIPERACILLIN-TAZOBACTAM IN DEX 2-0.25 GM/50ML IV SOLN
2.2500 g | Freq: Three times a day (TID) | INTRAVENOUS | Status: DC
  Administered 2014-11-10 – 2014-11-15 (×14): 2.25 g via INTRAVENOUS
  Filled 2014-11-10 (×19): qty 50

## 2014-11-10 MED ORDER — VANCOMYCIN HCL IN DEXTROSE 1-5 GM/200ML-% IV SOLN
1000.0000 mg | Freq: Once | INTRAVENOUS | Status: AC
  Administered 2014-11-10: 1000 mg via INTRAVENOUS
  Filled 2014-11-10 (×2): qty 200

## 2014-11-10 MED ORDER — FENTANYL CITRATE 0.05 MG/ML IJ SOLN
INTRAMUSCULAR | Status: AC | PRN
  Administered 2014-11-10: 50 ug via INTRAVENOUS

## 2014-11-10 MED ORDER — MIDODRINE HCL 5 MG PO TABS
ORAL_TABLET | ORAL | Status: AC
  Filled 2014-11-10: qty 2

## 2014-11-10 MED ORDER — PIPERACILLIN-TAZOBACTAM IN DEX 2-0.25 GM/50ML IV SOLN: 2.2500 g | Freq: Three times a day (TID) | INTRAVENOUS | Status: DC

## 2014-11-10 MED ORDER — MIDAZOLAM HCL 2 MG/2ML IJ SOLN
INTRAMUSCULAR | Status: AC | PRN
  Administered 2014-11-10: 1 mg via INTRAVENOUS

## 2014-11-10 MED ORDER — MUPIROCIN 2 % EX OINT
1.0000 "application " | TOPICAL_OINTMENT | Freq: Two times a day (BID) | CUTANEOUS | Status: AC
  Administered 2014-11-10 – 2014-11-15 (×9): 1 via NASAL
  Filled 2014-11-10: qty 22

## 2014-11-10 MED ORDER — SODIUM CHLORIDE 0.9 % IV SOLN
62.5000 mg | INTRAVENOUS | Status: DC
  Administered 2014-11-12: 62.5 mg via INTRAVENOUS
  Filled 2014-11-10 (×5): qty 5

## 2014-11-10 MED ORDER — LIDOCAINE-EPINEPHRINE 1 %-1:100000 IJ SOLN
INTRAMUSCULAR | Status: AC
  Filled 2014-11-10: qty 1

## 2014-11-10 MED ORDER — FENTANYL CITRATE 0.05 MG/ML IJ SOLN
INTRAMUSCULAR | Status: AC
  Filled 2014-11-10: qty 2

## 2014-11-10 MED ORDER — VANCOMYCIN HCL 10 G IV SOLR
2000.0000 mg | Freq: Once | INTRAVENOUS | Status: AC
  Administered 2014-11-10: 2000 mg via INTRAVENOUS
  Filled 2014-11-10: qty 2000

## 2014-11-10 MED ORDER — WARFARIN SODIUM 2 MG PO TABS
2.0000 mg | ORAL_TABLET | Freq: Once | ORAL | Status: DC
Start: 2014-11-10 — End: 2014-11-10
  Filled 2014-11-10: qty 1

## 2014-11-10 NOTE — Sedation Documentation (Signed)
Pt transferred to bed via slideboard,still very drowsy.

## 2014-11-10 NOTE — Sedation Documentation (Signed)
Dr. Pascal Lux sutured CT in place. Pt w/ eyes closed, tolerating well.

## 2014-11-10 NOTE — Progress Notes (Addendum)
[place] NOTE   Pharmacy Consult for Coumadin (chronic Rx PTA); Vancomycin & Zosyn Indication: atrial fibrillation; complex pleural effusion   Allergies  Allergen Reactions  . Bacitracin Other (See Comments)    unknown  . Norvasc [Amlodipine Besylate] Other (See Comments)    unknown    Patient Measurements: Height: 6\' 4"  (193 cm) Weight: 200 lb (90.719 kg) IBW/kg (Calculated) : 86.8  Vital Signs: Temp: 97.7 F (36.5 C) (03/15 0454) Temp Source: Oral (03/15 0454) BP: 133/89 mmHg (03/15 0454) Pulse Rate: 82 (03/15 0454)  Labs:  Recent Labs  11/09/14 1055  HGB 10.0*  HCT 33.0*  PLT 238  LABPROT 16.4*  INR 1.31  CREATININE 6.85*  TROPONINI <0.03    Estimated Creatinine Clearance: 13.2 mL/min (by C-G formula based on Cr of 6.85).   Medical History: Past Medical History  Diagnosis Date  . UGI bleed 02/12/11    On anticoagulation; EGD w/snare polypectomy-multiple polypoid lesions antrum(benign), Chronic active gastritis (NEGATIVE H pylori)  . Anemia, chronic disease   . Sepsis due to enterococcus 02/11/11  . Atrial fibrillation     On coumadin  . Coronary atherosclerosis of native coronary artery     Multivessel status post CABG  . Cardiomyopathy   . Type 2 diabetes mellitus   . Essential hypertension, benign   . Hyperlipemia   . Gout   . S/P patent foramen ovale closure   . Hyperbilirubinemia 08/10/2013  . SBO (small bowel obstruction) 01/2014  . 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 Clostridium difficile colitis 01/2014  . Diverticulosis   . Internal hemorrhoids   . ESRD on hemodialysis     Started 2008-2009. Dr Hinda Lenis on a TTS schedule - R forearm AVF for several years      Medications:  Prescriptions prior to admission  Medication Sig Dispense Refill Last Dose  . albuterol (PROVENTIL) (2.5 MG/3ML) 0.083% nebulizer solution Take 3 mLs (2.5 mg total) by nebulization every 2 (two) hours as needed for wheezing. 75 mL 12  11/06/2014  . allopurinol (ZYLOPRIM) 100 MG tablet Take 100 mg by mouth daily.    11/08/2014 at Unknown time  . amiodarone (PACERONE) 200 MG tablet Take 1 tablet (200 mg total) by mouth daily.   2-3 days  . B Complex-C-Zn-Folic Acid (DIALYVITE/ZINC PO) Take by mouth 3 (three) times a week. Patient has dialysis on Tuesdays, Thursdays, and Saturdays   11/07/2014  . FLUoxetine (PROZAC) 20 MG capsule Take 20 mg by mouth daily.   11/08/2014 at Unknown time  . folic acid (FOLVITE) 1 MG tablet Take 1 mg by mouth daily.     11/08/2014 at Unknown time  . insulin glargine (LANTUS) 100 UNIT/ML injection Inject 10 Units into the skin at bedtime.   11/08/2014 at Unknown time  . metoprolol (LOPRESSOR) 50 MG tablet Take 75 mg by mouth 2 (two) times daily.   11/08/2014 at 830 pm  . midodrine (PROAMATINE) 10 MG tablet Take 1 tablet (10 mg total) by mouth 2 (two) times daily with a meal.   11/08/2014 at Unknown time  . pantoprazole (PROTONIX) 40 MG tablet Take 40 mg by mouth 2 (two) times daily.    11/08/2014 at Unknown time  . RENVELA 800 MG tablet Take 800 mg by mouth 3 (three) times daily with meals.    11/08/2014 at Unknown time  . SENSIPAR 30 MG tablet Take 30 mg by mouth daily.   11/08/2014 at Unknown time  . traMADol (ULTRAM) 50  MG tablet Take by mouth every 8 (eight) hours as needed for moderate pain.   unknown  . traZODone (DESYREL) 50 MG tablet Take 1 tablet (50 mg total) by mouth at bedtime. 30 tablet 0 11/08/2014 at Unknown time  . warfarin (COUMADIN) 1 MG tablet Take 1/2 tablet daily (Patient taking differently: Take 1/2 tablet qhs) 30 tablet 3 11/08/2014 at Unknown time  . HYDROcodone-acetaminophen (NORCO/VICODIN) 5-325 MG per tablet Take 1 tablet by mouth every 6 (six) hours as needed for moderate pain. 100 tablet 0     Assessment: 64yo male who presented with 3 day history of increasing shortness of breath with productive cough. Patient is on chronic HD on T/T/S   ID: Chest CT showed large and complex right  pleural effusion in right lung. He will likely need thoracentesis. Pharmacy consulted to start empiric Vancomycin and Zosyn. WBC is wnl. Pt is afebrile.   AC: on chronic Coumadin PTA for h/o afib.  INR was sub-therapeutic on admission and yesterday's dose was ordered but not given. No INR collected today but likely remains sub-therapeutic. Patient takes 0.5 mg on T/T/S and 1 mg on all other days    Goal of Therapy:  INR 2-3 Monitor platelets by anticoagulation protocol: Yes   Plan:  Coumadin 2mg  po today x 1 (to boost INR) Vancomycin 2 gm IV load dose. Will order maintenance dose once HD scheduled Zosyn 2.25 gm IV Q 8 hours  Monitor CBC and clinical progress  INR daily until stable. F/u HD schedule   Albertina Parr, PharmD., BCPS Clinical Pharmacist Pager [phone]   Addendum: HD plans for T-Th-Sat.  First tonight.  Plan: Vanc 1g post HD T/Th/Sat.  Follow-up for changes.   Sloan Leiter, PharmD, BCPS Clinical Pharmacist [phone] 11/10/2014, 7:51 PM

## 2014-11-10 NOTE — Sedation Documentation (Signed)
MRSA precautions

## 2014-11-10 NOTE — Procedures (Signed)
Patient was seen on dialysis and the procedure was supervised.  BFR 400  Via AVF BP is  101/65.   Patient appears to be tolerating treatment well  [name] [name] A 11/10/2014

## 2014-11-10 NOTE — Progress Notes (Signed)
[facility] PROGRESS NOTE  RUCKER PRIDGEON ALP:[id number] DOB: 04-18-1951 DOA: 11/09/2014  PCP: [name], MD  Brief HPI: 64 year old (taken male presented with three-day history of shortness of breath. He has a previously known history of pleural effusion. He has had MRSA in that effusion in December. He was treated with intravenous vancomycin for possibly 2 weeks. He presented with shortness of breath. He was found to have a complex fluid collection in his right thorax. He was transferred to [facility] for further management.  Past medical history:  Past Medical History  Diagnosis Date  . UGI bleed 02/12/11    On anticoagulation; EGD w/snare polypectomy-multiple polypoid lesions antrum(benign), Chronic active gastritis (NEGATIVE H pylori)  . Anemia, chronic disease   . Sepsis due to enterococcus 02/11/11  . Atrial fibrillation     On coumadin  . Coronary atherosclerosis of native coronary artery     Multivessel status post CABG  . Cardiomyopathy   . Type 2 diabetes mellitus   . Essential hypertension, benign   . Hyperlipemia   . Gout   . S/P patent foramen ovale closure   . Hyperbilirubinemia 08/10/2013  . SBO (small bowel obstruction) 01/2014  . 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 Clostridium difficile colitis 01/2014  . Diverticulosis   . Internal hemorrhoids   . ESRD on hemodialysis     Started 2008-2009. Dr Hinda Lenis on a TTS schedule - R forearm AVF for several years      Consultants: Cardiothoracic, interventional radiology   Procedures: chest tube placement in the right thoracic cavity 3/18  Antibiotics: Vancomycin and Zosyn 3/15  Subjective: Patient complains of some shortness of breath mainly with exertion. Denies any chest pain. No dizziness or light headedness.  Objective: Vital Signs  Filed Vitals:   11/09/14 1753 11/09/14 1852 11/09/14 2100 11/10/14 0454  BP: 111/87 141/73  133/89  Pulse: 87 83  82  Temp: 98.2 F (36.8 C) 98.2 F (36.8 C)   97.7 F (36.5 C)  TempSrc:  Oral  Oral  Resp: 20 20  19   Height:   6\' 4"  (1.93 m)   Weight:      SpO2: 96% 100%  95%    Intake/Output Summary (Last 24 hours) at 11/10/14 3532 Last data filed at 11/09/14 2359  Gross per 24 hour  Intake      3 ml  Output      0 ml  Net      3 ml   Filed Weights   11/09/14 0941  Weight: 90.719 kg (200 lb)   General appearance: alert, cooperative, appears stated age and no distress Resp: Dullness to percussion, right lung more than half way up. No wheezing or crackles in the left lung. Cardio: regular rate and rhythm, S1, S2 normal, no murmur, click, rub or gallop GI: soft, non-tender; bowel sounds normal; no masses,  no organomegaly Extremities: extremities normal, atraumatic, no cyanosis or edema Neurologic: Alert and oriented 3. No focal neurological deficits are noted.  Lab Results:  Basic Metabolic Panel:  Recent Labs Lab 11/09/14 1055  NA 140  K 4.4  CL 100  CO2 28  GLUCOSE 92  BUN 30*  CREATININE 6.85*  CALCIUM 7.8*   CBC:  Recent Labs Lab 11/09/14 1055  WBC 7.9  NEUTROABS 6.1  HGB 10.0*  HCT 33.0*  MCV 94.0  PLT 238   Cardiac Enzymes:  Recent Labs Lab 11/09/14 1055  TROPONINI <0.03   CBG:  Recent Labs  Lab 11/09/14 1728 11/09/14 2356 11/10/14 0814  GLUCAP 159* 126* 111*    Recent Results (from the past 240 hour(s))  MRSA PCR Screening     Status: Abnormal   Collection Time: 11/09/14  7:07 PM  Result Value Ref Range Status   MRSA by PCR POSITIVE (A) NEGATIVE Final    Comment:        The GeneXpert MRSA Assay (FDA approved for NASAL specimens only), is one component of a comprehensive MRSA colonization surveillance program. It is not intended to diagnose MRSA infection nor to guide or monitor treatment for MRSA infections. RESULT CALLED TO, READ BACK BY AND VERIFIED WITH: SPRADLING,S RN [id number] 11/09/14 MITCHELL,L       Studies/Results: Dg Chest 2 View  11/09/2014   CLINICAL DATA:  Shortness  of breath  EXAM: CHEST  2 VIEW  COMPARISON:  11/09/2014  FINDINGS: Unchanged cardiomegaly. Aortic and hilar contours or stable from prior. Patient is status post CABG and mitral valve replacement.  No interstitial coarsening with improved lung volumes. There is a moderate right pleural effusion with posterior loculation. The appearance is stable from two-view chest x-ray 08/13/2014, a peripherally enhancing loculated effusion based on abdominal CT 08/13/2014. Prominent right hilar opacity persists without evidence of hilar adenopathy or mass in the lateral projection. This appearance is likely from the pleural fluid.  Remote L1 compression fracture with advanced height loss. No acute osseous findings.  IMPRESSION: 1. Chronic, loculated right pleural effusion with atelectasis. 2. No evidence for pulmonary edema on this better inflated study.   Electronically Signed   By: Monte Fantasia M.D.   On: 11/09/2014 10:52   Ct Chest W Contrast  11/09/2014   CLINICAL DATA:  Shortness of breath.  Productive cough.  EXAM: CT CHEST WITH CONTRAST  TECHNIQUE: Multidetector CT imaging of the chest was performed during intravenous contrast administration.  CONTRAST:  71mL OMNIPAQUE IOHEXOL 300 MG/ML  SOLN  COMPARISON:  12/06/2011  FINDINGS: THORACIC INLET/BODY WALL:  No acute abnormality.  MEDIASTINUM:  Stable cardiomegaly, with especially prominent left atrial enlargement. The patient is status post mitral valve replacement and CABG. Portions of the LIMA and venous grafts which are large enough to evaluate are enhancing. No aortic aneurysm or dissection. No evidence of pulmonary embolism. Adenopathy stable to decreased from 2013, with right lower peritracheal and subcarinal nodes remaining the most prominent. A 15 mm metallic body (with the appearance of stent) within a right upper lobe pulmonary artery is stable from previous. There is no associated vessel occlusion.  LUNG WINDOWS:  Chronic loculated pleural effusion in the  posterior and lower right chest with thick enhancing rim. The fluid collection is large, measuring 18 cm in craniocaudal span by 10 cm in thickness. The appearance is stable compared to visible portions on abdominal CT 08/13/2014. Based on abdominal CT favor remote hemothorax. Opacification along the chronic pleural effusion has heterogeneous enhancement, swirling interstitium, and subpleural rounded morphology. This is predominantly within the right lower lobe, although also present along the posterior right upper lobe and right middle lobe. There is small, loculated pleural effusion in the anterior and upper right hemi thorax without pleural nodularity or enhancement in this location. Trace layering left pleural effusion. Stable left lower lobe pulmonary nodule at 6 mm. Given stability from 2013 this is considered benign. Mild apical emphysema and air trapping.  UPPER ABDOMEN:  Hepatic fissure enlargement and subtle liver surface nodularity could reflect early cirrhosis. Cholelithiasis without evidence of acute cholecystitis.  OSSEOUS:  Remote  L1 compression fracture with greater than 75% height loss. Remote left posterior rib fractures.  IMPRESSION: 1. Chronic large and complex right pleural effusion, remote hemothorax based on abdominal CT 02/27/2014. 2. Collapsed and distorted right lower lobe secondary to #1, likely with developing rounded atelectasis. Cannot exclude superimposed infection. 3. Indeterminate, small loculated pleural effusion in the right upper chest. 4. Multiple incidental findings are stable from 2013 and noted above.   Electronically Signed   By: Monte Fantasia M.D.   On: 11/09/2014 12:47   Dg Chest Portable 1 View  11/09/2014   CLINICAL DATA:  Shortness of breath for 1 week.  Dialysis.  EXAM: PORTABLE CHEST - 1 VIEW  COMPARISON:  08/16/2014  FINDINGS: There is chronic cardiomegaly post CABG and mitral valve replacement. Aortic contours are stable from prior. Pulmonary venous congestion  and mild pulmonary edema with a large right pleural effusion. New circumscribed opacity in the right hilum, likely fissural fluid.  IMPRESSION: 1. Pulmonary edema with chronic moderate right pleural effusion. 2. A right hilar opacity is likely fissural fluid. When clinically able recommend two-view chest x-ray.   Electronically Signed   By: Monte Fantasia M.D.   On: 11/09/2014 10:01    Medications:  Scheduled: . allopurinol  100 mg Oral Daily  . amiodarone  200 mg Oral Daily  . cinacalcet  30 mg Oral Q breakfast  . fentaNYL      . FLUoxetine  20 mg Oral Daily  . folic acid  1 mg Oral Daily  . insulin aspart  0-5 Units Subcutaneous QHS  . insulin aspart  0-9 Units Subcutaneous TID WC  . insulin glargine  10 Units Subcutaneous QHS  . lidocaine-EPINEPHrine      . metoprolol  75 mg Oral BID  . midazolam      . midodrine  10 mg Oral BID WC  . pantoprazole  40 mg Oral BID  . piperacillin-tazobactam (ZOSYN)  IV  2.25 g Intravenous Q8H  . sevelamer carbonate  800 mg Oral TID WC  . sodium chloride  3 mL Intravenous Q12H  . sodium chloride  3 mL Intravenous Q12H  . traZODone  50 mg Oral QHS  . vancomycin  2,000 mg Intravenous Once  . warfarin  2 mg Oral ONCE-1800  . Warfarin - Pharmacist Dosing Inpatient   Does not apply Q24H   Continuous:  OEU:[id number], ondansetron **OR** ondansetron (ZOFRAN) IV, traMADol  Assessment/Plan:  Active Problems:   Atrial fibrillation   ESRD (end stage renal disease) on dialysis   DM type 2 causing ESRD   Anemia of chronic disease   Recurrent right pleural effusion    Recurrent right pleural effusion The size is moderate to large. Even though his WBC is normal he has had some cough with yellowish expectoration. He has a known history of MRSA in the pleural fluid back in December. He'll be placed on vancomycin and Zosyn. He has been seen by cardiothoracic surgery. Plan is for chest tube placement by IR as he is not a good surgical candidate. Fluid should  be sent for cultures.  Chronic atrial fibrillation On chronic anticoagulations therapy with warfarin. INR was subtherapeutic yesterday. We will need to hold warfarin for the time being. Will need guidance from IR as to when anticoagulation can be reinitiated. His rate seems to be well controlled. Continue beta blocker. He is also on amiodarone.  End-stage renal disease on hemodialysis Nephrology is following. He is dialyzed Tuesday, Thursday, Saturday.   Type 2 diabetes  mellitus  Continue with home medications and sliding scale.  Incidental Cholelithiasis This was seen on the CT scan. He's asymptomatic. LFTs were normal. Abdomen is benign.  DVT Prophylaxis: Start heparin from tomorrow since warfarin is on hold.    Code Status: Full code  Family Communication: Discussed with the patient  Disposition Plan: Await chest tube placement.    LOS: 1 day   [facility] Pager [phone] 11/10/2014, 9:37 AM  If 7PM-7AM, please contact night-coverage at [email or web address], password [facility]

## 2014-11-10 NOTE — Sedation Documentation (Signed)
Patient is resting comfortably. Eyes closed, no grimacing or moaning noted.

## 2014-11-10 NOTE — Consult Note (Signed)
[address]       [place] [address]             [place] Record #[id number] Date of Birth: 06-Oct-1950  Referring: [place] [facility]: [name], MD  Chief Complaint:    Chief Complaint  Patient presents with  . Shortness of Breath    History of Present Illness:     [name] is a 64 yo [place] American gentleman with multiple medical problems.  He has ESRD on dialysis, HTN, DM, CAD S/P CABG/MVR in 2008, Right below the knee amputation  and history of nicotine abuse.  The patient presented to [facility] ED with complaints of cough and shortness of breath for the past week.  This was exacerabated by exertion or lying flat.  The cough is productive of white phlegm, but he denied fever, chest pain, nausea, vomiting , or diarrhea.  The patient was recently admitted in December 2015 for CHF exacerbation at which time he was found to have a large right sided pleural effusion which required drainage via Thoracentesis.  He was evaluated by TCTS at that time and he was not felt to need surgical intervention as this was most likely chronic in nature and had been improved from previous scans.  Also during that admission he underwent cardioversion for atrial fibrillation, which was unsuccessful.  He remains in rate controlled Atrial Fibrillation and takes Coumadin for this.  Work up in the ED with CXR showed a right sided pleural effusion.  Further workup with CT scan confirmed the presence of a large complex right sided pleural effusion.  Due to this the patient was admitted by the hospitalist service and transferred to [facility] for further care.  Currently the patient complains of shortness of breath which is mainly present when the patient is lying flat.  He is not on oxygen and has no other complaints.  Current Activity/ Functional Status: Patient is independent with mobility/ambulation, transfers, ADL's, IADL's.   Zubrod Score: At the time of surgery this patient's most appropriate activity status/level should be described as: []     0    Normal activity, no symptoms []     1    Restricted in physical strenuous activity but ambulatory, able to do out light work []     2    Ambulatory and capable of self care, unable to do work activities, up and about                 more than 50%  Of the time                            [x]     3    Only limited self care, in bed greater than 50% of waking hours []     4    Completely disabled, no self care, confined to bed or chair []     5    Moribund  Past Medical History  Diagnosis Date  . UGI bleed 02/12/11    On anticoagulation; EGD w/snare polypectomy-multiple polypoid lesions antrum(benign), Chronic active gastritis (NEGATIVE H pylori)  . Anemia, chronic disease   . Sepsis due to enterococcus 02/11/11  . Atrial fibrillation     On coumadin  . Coronary atherosclerosis of native coronary artery     Multivessel status post CABG  . Cardiomyopathy   .  Type 2 diabetes mellitus   . Essential hypertension, benign   . Hyperlipemia   . Gout   . S/P patent foramen ovale closure   . Hyperbilirubinemia 08/10/2013  . SBO (small bowel obstruction) 01/2014  . 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 Clostridium difficile colitis 01/2014  . Diverticulosis   . Internal hemorrhoids   . ESRD on hemodialysis     Started 2008-2009. Dr Hinda Lenis on a TTS schedule - R forearm AVF for several years      Past Surgical History  Procedure Laterality Date  . Mitral valve replacement  01/05/2007    Bioprosthetic 24mm Edwards precordial tissue (Dr. Servando Snare)  . Coronary artery bypass graft  01/05/2007    LIMA-LAD, SVG-OM, seq. SVG-PDA, PLA-RCA  . Colonoscopy  06/23/2011    Dr. Oneida Alar: multiple sessile and pedunculated polyps, moderate diverticulosis, internal hemorrhoids, +ADENOMATOUS POLYPS, consider genetic testing, surveillance in October 2015   . Esophagogastroduodenoscopy  02/12/2011     CHRONIC ACTIVE GASTRITIS, NO H. pylori  . Transthoracic echocardiogram  11/2011    EF 25% w/ mod dilatation of LV & mod LVH; LA severely dilated; mod-severe dilation of RV with mod-severe decrease in RV function; mild MR & TR  . Cardioversion  04/19/2007    Dr. Marella Chimes  . Transthoracic echocardiogram  02/14/2011    EF 40-45%, mod conc LVH; ventricular septum with motion showing abnormal function, dyssynergy, paradox; mild AS; mild-mod MR stenosis; LA severely dilated; aptrial septum bowed from L to R with increased LA pressure   . Amputation Right 07/14/2013    Procedure: AMPUTATION DIGIT;  Surgeon: [name], MD;  Location: [facility];  Service: General;  Laterality: Right;  . Amputation Right 08/08/2013    Procedure: AMPUTATION BELOW KNEE;  Surgeon: [name], MD;  Location: [facility];  Service: General;  Laterality: Right;  . Stump revision Right 09/19/2013    Procedure: STUMP REVISION;  Surgeon: Scherry Ran, MD;  Location: [facility];  Service: General;  Laterality: Right;  . Amputation Right 10/17/2013    Procedure: AMPUTATION ABOVE KNEE;  Surgeon: [name], MD;  Location: [facility];  Service: General;  Laterality: Right;  . Colonoscopy N/A 04/10/2014    Procedure: COLONOSCOPY;  Surgeon: [name], MD;  Location: [facility];  Service: Endoscopy;  Laterality: N/A;  830  . Tee without cardioversion N/A 08/19/2014    Procedure: TRANSESOPHAGEAL ECHOCARDIOGRAM (TEE);  Surgeon: [name], MD;  Location: [facility] ENDOSCOPY;  Service: Cardiovascular;  Laterality: N/A;  . Cardioversion N/A 08/19/2014    Procedure: CARDIOVERSION;  Surgeon: [name], MD;  Location: [facility] ENDOSCOPY;  Service: Cardiovascular;  Laterality: N/A;    History  Smoking status  . Former Smoker -- 1.00 packs/day for 8 years  . Types: Cigarettes  . Start date: 04/27/1969  . Quit date: 11/27/2006  Smokeless tobacco  . Never Used    Comment: quit about 5 yrs    History  Alcohol Use No     History   Social History  . Marital Status: Married    Spouse Name: N/A  . Number of Children: 1  . Years of Education: N/A   Occupational History  . Retired     [employer]  .     Social History Main Topics  . Smoking status: Former Smoker -- 1.00 packs/day for 8 years    Types: Cigarettes    Start date: 04/27/1969    Quit date: 11/27/2006  .  Smokeless tobacco: Never Used     Comment: quit about 5 yrs  . Alcohol Use: No  . Drug Use: No  . Sexual Activity: Not on file   Other Topics Concern  . Not on file   Social History Narrative   ONE CHILD AGE 33-MALE. LIVES IN .    Allergies  Allergen Reactions  . Bacitracin Other (See Comments)    unknown  . Norvasc [Amlodipine Besylate] Other (See Comments)    unknown    Current Facility-Administered Medications  Medication Dose Route Frequency Provider Last Rate Last Dose  . albuterol (PROVENTIL) (2.5 MG/3ML) 0.083% nebulizer solution 2.5 mg  2.5 mg Nebulization Q2H PRN Nimish C Gosrani, MD      . allopurinol (ZYLOPRIM) tablet 100 mg  100 mg Oral Daily Nimish C Anastasio Champion, MD   100 mg at 11/09/14 2355  . amiodarone (PACERONE) tablet 200 mg  200 mg Oral Daily Doree Albee, MD   200 mg at 11/09/14 2355  . cinacalcet (SENSIPAR) tablet 30 mg  30 mg Oral Q breakfast Nimish C Anastasio Champion, MD   30 mg at 11/10/14 0732  . FLUoxetine (PROZAC) capsule 20 mg  20 mg Oral Daily Nimish C Gosrani, MD      . folic acid (FOLVITE) tablet 1 mg  1 mg Oral Daily Nimish C Gosrani, MD      . insulin aspart (novoLOG) injection 0-5 Units  0-5 Units Subcutaneous QHS Nimish C Gosrani, MD      . insulin aspart (novoLOG) injection 0-9 Units  0-9 Units Subcutaneous TID WC Nimish C Gosrani, MD      . insulin glargine (LANTUS) injection 10 Units  10 Units Subcutaneous QHS Doree Albee, MD   10 Units at 11/09/14 2357  . metoprolol tartrate (LOPRESSOR) tablet 75 mg  75 mg Oral BID Doree Albee, MD   75 mg at 11/09/14 2354  .  midodrine (PROAMATINE) tablet 10 mg  10 mg Oral BID WC Nimish Luther Parody, MD   10 mg at 11/09/14 2354  . ondansetron (ZOFRAN) tablet 4 mg  4 mg Oral Q6H PRN Nimish Luther Parody, MD       Or  . ondansetron (ZOFRAN) injection 4 mg  4 mg Intravenous Q6H PRN Nimish C Gosrani, MD      . pantoprazole (PROTONIX) EC tablet 40 mg  40 mg Oral BID Doree Albee, MD   40 mg at 11/09/14 2354  . sevelamer carbonate (RENVELA) tablet 800 mg  800 mg Oral TID WC Nimish C Anastasio Champion, MD   800 mg at 11/09/14 2355  . sodium chloride 0.9 % injection 3 mL  3 mL Intravenous Q12H Nimish C Gosrani, MD      . sodium chloride 0.9 % injection 3 mL  3 mL Intravenous Q12H Nimish C Gosrani, MD   3 mL at 11/09/14 2359  . traMADol (ULTRAM) tablet 50 mg  50 mg Oral Q12H PRN Doree Albee, MD   50 mg at 11/10/14 0457  . traZODone (DESYREL) tablet 50 mg  50 mg Oral QHS Nimish C Anastasio Champion, MD   50 mg at 11/09/14 2354  . warfarin (COUMADIN) tablet 2 mg  2 mg Oral Once Nimish Luther Parody, MD      . Warfarin - Pharmacist Dosing Inpatient   Does not apply Q24H Nimish Luther Parody, MD        Prescriptions prior to admission  Medication Sig Dispense Refill Last Dose  . albuterol (PROVENTIL) (2.5 MG/3ML)  0.083% nebulizer solution Take 3 mLs (2.5 mg total) by nebulization every 2 (two) hours as needed for wheezing. 75 mL 12 11/06/2014  . allopurinol (ZYLOPRIM) 100 MG tablet Take 100 mg by mouth daily.    11/08/2014 at Unknown time  . amiodarone (PACERONE) 200 MG tablet Take 1 tablet (200 mg total) by mouth daily.   2-3 days  . B Complex-C-Zn-Folic Acid (DIALYVITE/ZINC PO) Take by mouth 3 (three) times a week. Patient has dialysis on Tuesdays, Thursdays, and Saturdays   11/07/2014  . FLUoxetine (PROZAC) 20 MG capsule Take 20 mg by mouth daily.   11/08/2014 at Unknown time  . folic acid (FOLVITE) 1 MG tablet Take 1 mg by mouth daily.     11/08/2014 at Unknown time  . insulin glargine (LANTUS) 100 UNIT/ML injection Inject 10 Units into the skin at  bedtime.   11/08/2014 at Unknown time  . metoprolol (LOPRESSOR) 50 MG tablet Take 75 mg by mouth 2 (two) times daily.   11/08/2014 at 830 pm  . midodrine (PROAMATINE) 10 MG tablet Take 1 tablet (10 mg total) by mouth 2 (two) times daily with a meal.   11/08/2014 at Unknown time  . pantoprazole (PROTONIX) 40 MG tablet Take 40 mg by mouth 2 (two) times daily.    11/08/2014 at Unknown time  . RENVELA 800 MG tablet Take 800 mg by mouth 3 (three) times daily with meals.    11/08/2014 at Unknown time  . SENSIPAR 30 MG tablet Take 30 mg by mouth daily.   11/08/2014 at Unknown time  . traMADol (ULTRAM) 50 MG tablet Take by mouth every 8 (eight) hours as needed for moderate pain.   unknown  . traZODone (DESYREL) 50 MG tablet Take 1 tablet (50 mg total) by mouth at bedtime. 30 tablet 0 11/08/2014 at Unknown time  . warfarin (COUMADIN) 1 MG tablet Take 1/2 tablet daily (Patient taking differently: Take 1/2 tablet qhs) 30 tablet 3 11/08/2014 at Unknown time  . HYDROcodone-acetaminophen (NORCO/VICODIN) 5-325 MG per tablet Take 1 tablet by mouth every 6 (six) hours as needed for moderate pain. 100 tablet 0     Family History  Problem Relation Age of Onset  . Diabetes Mother   . Diabetes Father   . Diabetes Sister   . Colon cancer Neg Hx   . Colon polyps Neg Hx      Review of Systems:      Cardiac Review of Systems: Y or N  Chest Pain [  n  ]  Resting SOB [ n  ] Exertional SOB  [x  ]  Orthopnea [  ]   Pedal Edema [   ]    Palpitations [x  ] Syncope  [  ]   Presyncope [   ]  General Review of Systems: [Y] = yes [  ]=no Constitional: recent weight change [  ]; anorexia [  ]; fatigue [  ]; nausea [  ]; night sweats [  ]; fever [n  ]; or chills [  ]                                                               Dental: poor dentition[  ]; Last Dentist visit:   Eye : blurred vision [  ];  diplopia [   ]; vision changes [  ];  Amaurosis fugax[  ]; Resp: cough [  y];  wheezing[  ];  hemoptysis[  ]; shortness of  breath[  y]; paroxysmal nocturnal dyspnea[y  ]; dyspnea on exertion[y  ]; or orthopnea[  ];  GI:  gallstones[  ], vomiting[  ];  dysphagia[  ]; melena[  ];  hematochezia [  ]; heartburn[  ];   Hx of  Colonoscopy[  ]; GU: kidney stones [  ]; hematuria[  ];   dysuria [  ];  nocturia[  ];  history of     obstruction [  ]; urinary frequency [  ]             Skin: rash, swelling[  ];, hair loss[  ];  peripheral edema[  ];  or itching[  ]; Musculosketetal: myalgias[  ];  joint swelling[  ];  joint erythema[  ];  joint pain[  ];  back pain[  ];  Heme/Lymph: bruising[  ];  bleeding[  ];  anemia[  ];  Neuro: TIA[  ];  headaches[  ];  stroke[  ];  vertigo[  ];  seizures[  ];   paresthesias[  ];  difficulty walking[  ];  Psych:depression[  ]; anxiety[  ];  Endocrine: diabetes[ y ];  thyroid dysfunction[  ];  Immunizations: Flu [ ? ]; Pneumococcal[?  ];  Other:  Physical Exam: BP 133/89 mmHg  Pulse 82  Temp(Src) 97.7 F (36.5 C) (Oral)  Resp 19  Ht 6\' 4"  (1.93 m)  Wt 200 lb (90.719 kg)  BMI 24.35 kg/m2  SpO2 95%   General appearance: alert, cooperative and no distress Head: Normocephalic, without obvious abnormality, atraumatic Neck: no adenopathy, no carotid bruit, no JVD, supple, symmetrical, trachea midline and thyroid not enlarged, symmetric, no tenderness/mass/nodules Lymph nodes: Cervical, supraclavicular, and axillary nodes normal. Resp: diminished breath sounds on right Cardio: irregularly irregular rhythm GI: soft, non-tender; bowel sounds normal; no masses,  no organomegaly Extremities: right below the knee amputation, LLE WNL Neurologic: Grossly normal  Diagnostic Studies & Laboratory data:     Recent Radiology Findings:   Dg Chest 2 View  11/09/2014   CLINICAL DATA:  Shortness of breath  EXAM: CHEST  2 VIEW  COMPARISON:  11/09/2014  FINDINGS: Unchanged cardiomegaly. Aortic and hilar contours or stable from prior. Patient is status post CABG and mitral valve replacement.  No  interstitial coarsening with improved lung volumes. There is a moderate right pleural effusion with posterior loculation. The appearance is stable from two-view chest x-ray 08/13/2014, a peripherally enhancing loculated effusion based on abdominal CT 08/13/2014. Prominent right hilar opacity persists without evidence of hilar adenopathy or mass in the lateral projection. This appearance is likely from the pleural fluid.  Remote L1 compression fracture with advanced height loss. No acute osseous findings.  IMPRESSION: 1. Chronic, loculated right pleural effusion with atelectasis. 2. No evidence for pulmonary edema on this better inflated study.   Electronically Signed   By: Monte Fantasia M.D.   On: 11/09/2014 10:52   Ct Chest W Contrast  11/09/2014   CLINICAL DATA:  Shortness of breath.  Productive cough.  EXAM: CT CHEST WITH CONTRAST  TECHNIQUE: Multidetector CT imaging of the chest was performed during intravenous contrast administration.  CONTRAST:  10mL OMNIPAQUE IOHEXOL 300 MG/ML  SOLN  COMPARISON:  12/06/2011  FINDINGS: THORACIC INLET/BODY WALL:  No acute abnormality.  MEDIASTINUM:  Stable cardiomegaly, with especially prominent left atrial enlargement. The  patient is status post mitral valve replacement and CABG. Portions of the LIMA and venous grafts which are large enough to evaluate are enhancing. No aortic aneurysm or dissection. No evidence of pulmonary embolism. Adenopathy stable to decreased from 2013, with right lower peritracheal and subcarinal nodes remaining the most prominent. A 15 mm metallic body (with the appearance of stent) within a right upper lobe pulmonary artery is stable from previous. There is no associated vessel occlusion.  LUNG WINDOWS:  Chronic loculated pleural effusion in the posterior and lower right chest with thick enhancing rim. The fluid collection is large, measuring 18 cm in craniocaudal span by 10 cm in thickness. The appearance is stable compared to visible portions  on abdominal CT 08/13/2014. Based on abdominal CT favor remote hemothorax. Opacification along the chronic pleural effusion has heterogeneous enhancement, swirling interstitium, and subpleural rounded morphology. This is predominantly within the right lower lobe, although also present along the posterior right upper lobe and right middle lobe. There is small, loculated pleural effusion in the anterior and upper right hemi thorax without pleural nodularity or enhancement in this location. Trace layering left pleural effusion. Stable left lower lobe pulmonary nodule at 6 mm. Given stability from 2013 this is considered benign. Mild apical emphysema and air trapping.  UPPER ABDOMEN:  Hepatic fissure enlargement and subtle liver surface nodularity could reflect early cirrhosis. Cholelithiasis without evidence of acute cholecystitis.  OSSEOUS:  Remote L1 compression fracture with greater than 75% height loss. Remote left posterior rib fractures.  IMPRESSION: 1. Chronic large and complex right pleural effusion, remote hemothorax based on abdominal CT 02/27/2014. 2. Collapsed and distorted right lower lobe secondary to #1, likely with developing rounded atelectasis. Cannot exclude superimposed infection. 3. Indeterminate, small loculated pleural effusion in the right upper chest. 4. Multiple incidental findings are stable from 2013 and noted above.   Electronically Signed   By: Monte Fantasia M.D.   On: 11/09/2014 12:47   Dg Chest Portable 1 View  11/09/2014   CLINICAL DATA:  Shortness of breath for 1 week.  Dialysis.  EXAM: PORTABLE CHEST - 1 VIEW  COMPARISON:  08/16/2014  FINDINGS: There is chronic cardiomegaly post CABG and mitral valve replacement. Aortic contours are stable from prior. Pulmonary venous congestion and mild pulmonary edema with a large right pleural effusion. New circumscribed opacity in the right hilum, likely fissural fluid.  IMPRESSION: 1. Pulmonary edema with chronic moderate right pleural  effusion. 2. A right hilar opacity is likely fissural fluid. When clinically able recommend two-view chest x-ray.   Electronically Signed   By: Monte Fantasia M.D.   On: 11/09/2014 10:01     I have independently reviewed the above radiologic studies.  Recent Lab Findings: Lab Results  Component Value Date   WBC 7.9 11/09/2014   HGB 10.0* 11/09/2014   HCT 33.0* 11/09/2014   PLT 238 11/09/2014   GLUCOSE 92 11/09/2014   CHOL 60 03/02/2014   TRIG 54 03/02/2014   HDL 27* 03/02/2014   LDLCALC 22 03/02/2014   ALT 14 09/29/2014   AST 29 09/29/2014   NA 140 11/09/2014   K 4.4 11/09/2014   CL 100 11/09/2014   CREATININE 6.85* 11/09/2014   BUN 30* 11/09/2014   CO2 28 11/09/2014   TSH 1.210 08/13/2014   INR 1.31 11/09/2014   HGBA1C 5.9* 03/02/2014   Assessment / Plan:      1. Right  Loculated Pleural Effusion, Chronic- this has been present since July.  He was evaluated  by Dr. Roxan Hockey 07/2014 at which time surgical intervention was not indicated, but thoracentesis was performed.  It is felt this is likely an is a poor does not make the best surgical candidate. 2. Dispo-Patient has been NPO since last night and having IR consult, for CT guided chest tube placement/drainage- orders have been placed and we have spoken with IR    Grace Isaac MD      [address] ,[place] [address] Office [phone]   Beeper [phone]  11/10/2014 8:33 AM

## 2014-11-10 NOTE — Consult Note (Signed)
Chief Complaint: Chief Complaint  Patient presents with  . Shortness of Breath  recurrent Rt loculated pleural effusion  Referring Physician(s): Dr Servando Snare  History of Present Illness: [name] [name] is a 64 y.o. male   Pt comes to ED with increasing cough and shortness of breath x 3 days Work up reveals loculated Rt pleural effusion Was seen Dec 2015 for same---treated for pneumonia with antibiotics and did well Pt has been seen by Thoracic surgery Dr Servando Snare Requesting Rt chest tube drain placement for recurrent Rt loculated pleural effusion Dr Pascal Lux has reviewed imaging and has discussed with Dr Mosetta Pigeon chest tube placement Hx ESRD; Afib; CAD; DM On coumadin for Afib: INR 1.31 today   Past Medical History  Diagnosis Date  . UGI bleed 02/12/11    On anticoagulation; EGD w/snare polypectomy-multiple polypoid lesions antrum(benign), Chronic active gastritis (NEGATIVE H pylori)  . Anemia, chronic disease   . Sepsis due to enterococcus 02/11/11  . Atrial fibrillation     On coumadin  . Coronary atherosclerosis of native coronary artery     Multivessel status post CABG  . Cardiomyopathy   . Type 2 diabetes mellitus   . Essential hypertension, benign   . Hyperlipemia   . Gout   . S/P patent foramen ovale closure   . Hyperbilirubinemia 08/10/2013  . SBO (small bowel obstruction) 01/2014  . 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 Clostridium difficile colitis 01/2014  . Diverticulosis   . Internal hemorrhoids   . ESRD on hemodialysis     Started 2008-2009. Dr Hinda Lenis on a TTS schedule - R forearm AVF for several years      Past Surgical History  Procedure Laterality Date  . Mitral valve replacement  01/05/2007    Bioprosthetic 31mm Edwards precordial tissue (Dr. Servando Snare)  . Coronary artery bypass graft  01/05/2007    LIMA-LAD, SVG-OM, seq. SVG-PDA, PLA-RCA  . Colonoscopy  06/23/2011    Dr. Oneida Alar: multiple sessile and pedunculated polyps, moderate  diverticulosis, internal hemorrhoids, +ADENOMATOUS POLYPS, consider genetic testing, surveillance in October 2015   . Esophagogastroduodenoscopy  02/12/2011    CHRONIC ACTIVE GASTRITIS, NO H. pylori  . Transthoracic echocardiogram  11/2011    EF 25% w/ mod dilatation of LV & mod LVH; LA severely dilated; mod-severe dilation of RV with mod-severe decrease in RV function; mild MR & TR  . Cardioversion  04/19/2007    Dr. Marella Chimes  . Transthoracic echocardiogram  02/14/2011    EF 40-45%, mod conc LVH; ventricular septum with motion showing abnormal function, dyssynergy, paradox; mild AS; mild-mod MR stenosis; LA severely dilated; aptrial septum bowed from L to R with increased LA pressure   . Amputation Right 07/14/2013    Procedure: AMPUTATION DIGIT;  Surgeon: [name], MD;  Location: [facility];  Service: General;  Laterality: Right;  . Amputation Right 08/08/2013    Procedure: AMPUTATION BELOW KNEE;  Surgeon: [name], MD;  Location: [facility];  Service: General;  Laterality: Right;  . Stump revision Right 09/19/2013    Procedure: STUMP REVISION;  Surgeon: Scherry Ran, MD;  Location: [facility];  Service: General;  Laterality: Right;  . Amputation Right 10/17/2013    Procedure: AMPUTATION ABOVE KNEE;  Surgeon: [name], MD;  Location: [facility];  Service: General;  Laterality: Right;  . Colonoscopy N/A 04/10/2014    Procedure: COLONOSCOPY;  Surgeon: [name], MD;  Location: [facility];  Service: Endoscopy;  Laterality:  N/A;  830  . Tee without cardioversion N/A 08/19/2014    Procedure: TRANSESOPHAGEAL ECHOCARDIOGRAM (TEE);  Surgeon: [name], MD;  Location: [facility];  Service: Cardiovascular;  Laterality: N/A;  . Cardioversion N/A 08/19/2014    Procedure: CARDIOVERSION;  Surgeon: [name], MD;  Location: [facility] ENDOSCOPY;  Service: Cardiovascular;  Laterality: N/A;    Allergies: Bacitracin and Norvasc  Medications: Prior to Admission medications     Medication Sig Start Date End Date Taking? Authorizing Provider  albuterol (PROVENTIL) (2.5 MG/3ML) 0.083% nebulizer solution Take 3 mLs (2.5 mg total) by nebulization every 2 (two) hours as needed for wheezing. 02/21/14  Yes Kinnie Feil, MD  allopurinol (ZYLOPRIM) 100 MG tablet Take 100 mg by mouth daily.  05/15/11  Yes Historical Provider, MD  amiodarone (PACERONE) 200 MG tablet Take 1 tablet (200 mg total) by mouth daily. 08/31/14  Yes Satira Sark, MD  B Complex-C-Zn-Folic Acid (DIALYVITE/ZINC PO) Take by mouth 3 (three) times a week. Patient has dialysis on Tuesdays, Thursdays, and Saturdays   Yes Historical Provider, MD  FLUoxetine (PROZAC) 20 MG capsule Take 20 mg by mouth daily. 09/21/14  Yes Historical Provider, MD  folic acid (FOLVITE) 1 MG tablet Take 1 mg by mouth daily.     Yes Historical Provider, MD  insulin glargine (LANTUS) 100 UNIT/ML injection Inject 10 Units into the skin at bedtime.   Yes Historical Provider, MD  metoprolol (LOPRESSOR) 50 MG tablet Take 75 mg by mouth 2 (two) times daily. 07/04/14  Yes Historical Provider, MD  midodrine (PROAMATINE) 10 MG tablet Take 1 tablet (10 mg total) by mouth 2 (two) times daily with a meal. 08/20/14  Yes Silver Huguenin Elgergawy, MD  pantoprazole (PROTONIX) 40 MG tablet Take 40 mg by mouth 2 (two) times daily.  05/25/11  Yes Historical Provider, MD  RENVELA 800 MG tablet Take 800 mg by mouth 3 (three) times daily with meals.  05/03/11  Yes Historical Provider, MD  SENSIPAR 30 MG tablet Take 30 mg by mouth daily. 01/22/14  Yes Historical Provider, MD  traMADol (ULTRAM) 50 MG tablet Take by mouth every 8 (eight) hours as needed for moderate pain.   Yes Historical Provider, MD  traZODone (DESYREL) 50 MG tablet Take 1 tablet (50 mg total) by mouth at bedtime. 08/13/13  Yes Nita Sells, MD  warfarin (COUMADIN) 1 MG tablet Take 1/2 tablet daily Patient taking differently: Take 1/2 tablet qhs 09/08/14  Yes Satira Sark, MD   HYDROcodone-acetaminophen (NORCO/VICODIN) 5-325 MG per tablet Take 1 tablet by mouth every 6 (six) hours as needed for moderate pain. 08/06/14   Carole Civil, MD     Family History  Problem Relation Age of Onset  . Diabetes Mother   . Diabetes Father   . Diabetes Sister   . Colon cancer Neg Hx   . Colon polyps Neg Hx     History   Social History  . Marital Status: Married    Spouse Name: N/A  . Number of Children: 1  . Years of Education: N/A   Occupational History  . Retired     [employer]  .     Social History Main Topics  . Smoking status: Former Smoker -- 1.00 packs/day for 8 years    Types: Cigarettes    Start date: 04/27/1969    Quit date: 11/27/2006  . Smokeless tobacco: Never Used     Comment: quit about 5 yrs  . Alcohol Use: No  .  Drug Use: No  . Sexual Activity: Not on file   Other Topics Concern  . None   Social History Narrative   ONE CHILD AGE 24-MALE. LIVES IN [place].     Review of Systems: A 12 point ROS discussed and pertinent positives are indicated in the HPI above.  All other systems are negative.  Review of Systems  Constitutional: Positive for activity change. Negative for fever and fatigue.  Respiratory: Positive for cough and shortness of breath. Negative for choking.   Cardiovascular: Positive for chest pain.  Gastrointestinal: Negative for nausea and vomiting.  Musculoskeletal: Negative for back pain.  Neurological: Positive for weakness.  Psychiatric/Behavioral: Negative for behavioral problems and confusion.    Vital Signs: BP 133/89 mmHg  Pulse 82  Temp(Src) 97.7 F (36.5 C) (Oral)  Resp 19  Ht 6\' 4"  (1.93 m)  Wt 90.719 kg (200 lb)  BMI 24.35 kg/m2  SpO2 95%  Physical Exam  Constitutional: He is oriented to person, place, and time. He appears well-nourished.  Cardiovascular: Normal rate and regular rhythm.   No murmur heard. Pulmonary/Chest: Effort normal. He has wheezes.  Abdominal: Soft. Bowel  sounds are normal.  Musculoskeletal: Normal range of motion.  R AKA  Neurological: He is alert and oriented to person, place, and time.  Skin: Skin is warm and dry.  Psychiatric: He has a normal mood and affect. His behavior is normal. Judgment and thought content normal.  Nursing note and vitals reviewed.   Mallampati Score:  MD Evaluation Airway: WNL Heart: WNL Abdomen: WNL Chest/ Lungs: WNL ASA  Classification: 3 Mallampati/Airway Score: One  Imaging: Dg Chest 2 View  11/09/2014   CLINICAL DATA:  Shortness of breath  EXAM: CHEST  2 VIEW  COMPARISON:  11/09/2014  FINDINGS: Unchanged cardiomegaly. Aortic and hilar contours or stable from prior. Patient is status post CABG and mitral valve replacement.  No interstitial coarsening with improved lung volumes. There is a moderate right pleural effusion with posterior loculation. The appearance is stable from two-view chest x-ray 08/13/2014, a peripherally enhancing loculated effusion based on abdominal CT 08/13/2014. Prominent right hilar opacity persists without evidence of hilar adenopathy or mass in the lateral projection. This appearance is likely from the pleural fluid.  Remote L1 compression fracture with advanced height loss. No acute osseous findings.  IMPRESSION: 1. Chronic, loculated right pleural effusion with atelectasis. 2. No evidence for pulmonary edema on this better inflated study.   Electronically Signed   By: Monte Fantasia M.D.   On: 11/09/2014 10:52   Ct Chest W Contrast  11/09/2014   CLINICAL DATA:  Shortness of breath.  Productive cough.  EXAM: CT CHEST WITH CONTRAST  TECHNIQUE: Multidetector CT imaging of the chest was performed during intravenous contrast administration.  CONTRAST:  83mL OMNIPAQUE IOHEXOL 300 MG/ML  SOLN  COMPARISON:  12/06/2011  FINDINGS: THORACIC INLET/BODY WALL:  No acute abnormality.  MEDIASTINUM:  Stable cardiomegaly, with especially prominent left atrial enlargement. The patient is status post  mitral valve replacement and CABG. Portions of the LIMA and venous grafts which are large enough to evaluate are enhancing. No aortic aneurysm or dissection. No evidence of pulmonary embolism. Adenopathy stable to decreased from 2013, with right lower peritracheal and subcarinal nodes remaining the most prominent. A 15 mm metallic body (with the appearance of stent) within a right upper lobe pulmonary artery is stable from previous. There is no associated vessel occlusion.  LUNG WINDOWS:  Chronic loculated pleural effusion in the posterior and lower  right chest with thick enhancing rim. The fluid collection is large, measuring 18 cm in craniocaudal span by 10 cm in thickness. The appearance is stable compared to visible portions on abdominal CT 08/13/2014. Based on abdominal CT favor remote hemothorax. Opacification along the chronic pleural effusion has heterogeneous enhancement, swirling interstitium, and subpleural rounded morphology. This is predominantly within the right lower lobe, although also present along the posterior right upper lobe and right middle lobe. There is small, loculated pleural effusion in the anterior and upper right hemi thorax without pleural nodularity or enhancement in this location. Trace layering left pleural effusion. Stable left lower lobe pulmonary nodule at 6 mm. Given stability from 2013 this is considered benign. Mild apical emphysema and air trapping.  UPPER ABDOMEN:  Hepatic fissure enlargement and subtle liver surface nodularity could reflect early cirrhosis. Cholelithiasis without evidence of acute cholecystitis.  OSSEOUS:  Remote L1 compression fracture with greater than 75% height loss. Remote left posterior rib fractures.  IMPRESSION: 1. Chronic large and complex right pleural effusion, remote hemothorax based on abdominal CT 02/27/2014. 2. Collapsed and distorted right lower lobe secondary to #1, likely with developing rounded atelectasis. Cannot exclude superimposed  infection. 3. Indeterminate, small loculated pleural effusion in the right upper chest. 4. Multiple incidental findings are stable from 2013 and noted above.   Electronically Signed   By: Monte Fantasia M.D.   On: 11/09/2014 12:47   Dg Chest Portable 1 View  11/09/2014   CLINICAL DATA:  Shortness of breath for 1 week.  Dialysis.  EXAM: PORTABLE CHEST - 1 VIEW  COMPARISON:  08/16/2014  FINDINGS: There is chronic cardiomegaly post CABG and mitral valve replacement. Aortic contours are stable from prior. Pulmonary venous congestion and mild pulmonary edema with a large right pleural effusion. New circumscribed opacity in the right hilum, likely fissural fluid.  IMPRESSION: 1. Pulmonary edema with chronic moderate right pleural effusion. 2. A right hilar opacity is likely fissural fluid. When clinically able recommend two-view chest x-ray.   Electronically Signed   By: Monte Fantasia M.D.   On: 11/09/2014 10:01    Labs:  CBC:  Recent Labs  08/20/14 0524 09/09/14 1242 09/29/14 1257 11/09/14 1055  WBC 9.4 12.3* 16.2* 7.9  HGB 8.9* 7.7* 10.2* 10.0*  HCT 26.1* 24.8* 33.8* 33.0*  PLT 198 355 308 238    COAGS:  Recent Labs  10/08/14 1240 10/08/14 1243 10/19/14 1712 11/09/14 1055  INR 1.8 1.8 1.8 1.31    BMP:  Recent Labs  08/20/14 0524 09/09/14 1242 09/29/14 1257 11/09/14 1055  NA 138 139 138 140  K 3.8 4.3 3.8 4.4  CL 100 100 99 100  CO2 23 28 28 28   GLUCOSE 130* 183* 224* 92  BUN 37* 34* 18 30*  CALCIUM 8.3* 8.0* 7.9* 7.8*  CREATININE 7.59* 6.24* 3.92* 6.85*  GFRNONAA 7* 8* 15* 8*  GFRAA 8* 10* 17* 9*    LIVER FUNCTION TESTS:  Recent Labs  02/27/14 2022  08/13/14 0403 08/15/14 0245 08/16/14 1616 08/17/14 0320 08/17/14 1022 09/29/14 1257  BILITOT 1.2  --  1.5* 2.0*  --   --   --  1.7*  AST 19  --  34 34  --   --   --  29  ALT 10  --  20 24  --   --   --  14  ALKPHOS 92  --  168* 151*  --   --   --  127*  PROT 7.0  --  7.1 6.5 6.6  --   --  8.2  ALBUMIN  2.1*  < > 2.5* 2.1*  --  1.9* 2.0* 2.3*  < > = values in this interval not displayed.  TUMOR MARKERS: No results for input(s): AFPTM, CEA, CA199, CHROMGRNA in the last 8760 hours.  Assessment and Plan:  Recurrent Rt loculated pleural effusion Now scheduled for chest tube drain placement Pt aware of procedure benefits and risks including but not limited to: Infection; bleeding; pneumothorax; damage to surrounding structures Agreeable to proceed Consent signed and in chart   Thank you for this interesting consult.  I greatly enjoyed meeting KENSHIN SPLAWN and look forward to participating in their care.  Signed: Ramsey Guadamuz A 11/10/2014, 10:05 AM   I spent a total of 40 Minutes  in face to face in clinical consultation, greater than 50% of which was counseling/coordinating care for Rt chest tube drain placement

## 2014-11-10 NOTE — Sedation Documentation (Signed)
14Fr chest tube catheter placed. Pt tolerated well.

## 2014-11-10 NOTE — Consult Note (Signed)
[facility] Renal Consultation Note  Indication for Consultation:  Management of ESRD/hemodialysis; anemia, hypertension/volume and secondary hyperparathyroidism  HPI: [name] [name] is a 64 y.o. male On TTS hemodialysis ([facility] Dr.Befakadu follows started HD 2008-2009) admitted with increasing  SOB the past  3 days. His last HD was Saturday on schedule using his R A AVF wuth out reported problems. In the ER noted  to have a recurrent  large loculated right-sided pleural effusion.  Also December  2015 was admitted with what appeared to be a loculated effusion also on the right side associated with Pneumonia  and he had thoracentesis and was treated with antibiotics  did well and was discharged home .He has been seen by cardiothoracic surgery in past but not felt to be a stable OR candidate and IR consulted for chest tube.Ho A fib on coumadin with sub therapeutic level  on admit. He denies any fever and  reports  a cough productive with  white sputum and occasionally yellow sputum.He denies  nausea, vomiting, or abdominal pain.  He is now sp  successful CT guided right sided 14 Fr chest tube placement yielding 1 L of brown colored purulent material that was sent to Lab. No sob now. Feels better following procedure    Past Medical History  Diagnosis Date  . UGI bleed 02/12/11    On anticoagulation; EGD w/snare polypectomy-multiple polypoid lesions antrum(benign), Chronic active gastritis (NEGATIVE H pylori)  . Anemia, chronic disease   . Sepsis due to enterococcus 02/11/11  . Atrial fibrillation     On coumadin  . Coronary atherosclerosis of native coronary artery     Multivessel status post CABG  . Cardiomyopathy   . Type 2 diabetes mellitus   . Essential hypertension, benign   . Hyperlipemia   . Gout   . S/P patent foramen ovale closure   . Hyperbilirubinemia 08/10/2013  . SBO (small bowel obstruction) 01/2014  . 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 Clostridium  difficile colitis 01/2014  . Diverticulosis   . Internal hemorrhoids   . ESRD on hemodialysis     Started 2008-2009. Dr Hinda Lenis on a TTS schedule - R forearm AVF for several years      Past Surgical History  Procedure Laterality Date  . Mitral valve replacement  01/05/2007    Bioprosthetic 98m Edwards precordial tissue (Dr. GServando Snare  . Coronary artery bypass graft  01/05/2007    LIMA-LAD, SVG-OM, seq. SVG-PDA, PLA-RCA  . Colonoscopy  06/23/2011    Dr. FOneida Alar multiple sessile and pedunculated polyps, moderate diverticulosis, internal hemorrhoids, +ADENOMATOUS POLYPS, consider genetic testing, surveillance in October 2015   . Esophagogastroduodenoscopy  02/12/2011    CHRONIC ACTIVE GASTRITIS, NO H. pylori  . Transthoracic echocardiogram  11/2011    EF 25% w/ mod dilatation of LV & mod LVH; LA severely dilated; mod-severe dilation of RV with mod-severe decrease in RV function; mild MR & TR  . Cardioversion  04/19/2007    Dr. RMarella Chimes . Transthoracic echocardiogram  02/14/2011    EF 40-45%, mod conc LVH; ventricular septum with motion showing abnormal function, dyssynergy, paradox; mild AS; mild-mod MR stenosis; LA severely dilated; aptrial septum bowed from L to R with increased LA pressure   . Amputation Right 07/14/2013    Procedure: AMPUTATION DIGIT;  Surgeon: [name] MD;  Location: [facility];  Service: General;  Laterality: Right;  . Amputation Right 08/08/2013    Procedure: AMPUTATION BELOW KNEE;  Surgeon:  [name], MD;  Location: [facility];  Service: General;  Laterality: Right;  . Stump revision Right 09/19/2013    Procedure: STUMP REVISION;  Surgeon: Scherry Ran, MD;  Location: [facility];  Service: General;  Laterality: Right;  . Amputation Right 10/17/2013    Procedure: AMPUTATION ABOVE KNEE;  Surgeon: [name], MD;  Location: [facility];  Service: General;  Laterality: Right;  . Colonoscopy N/A 04/10/2014    Procedure: COLONOSCOPY;  Surgeon: [name], MD;   Location: [facility];  Service: Endoscopy;  Laterality: N/A;  830  . Tee without cardioversion N/A 08/19/2014    Procedure: TRANSESOPHAGEAL ECHOCARDIOGRAM (TEE);  Surgeon: [name], MD;  Location: [facility] ENDOSCOPY;  Service: Cardiovascular;  Laterality: N/A;  . Cardioversion N/A 08/19/2014    Procedure: CARDIOVERSION;  Surgeon: [name], MD;  Location: [facility] ENDOSCOPY;  Service: Cardiovascular;  Laterality: N/A;      Family History  Problem Relation Age of Onset  . Diabetes Mother   . Diabetes Father   . Diabetes Sister   . Colon cancer Neg Hx   . Colon polyps Neg Hx   Soc= Lives with  Wife in [place] area and     reports that he quit smoking about 7 years ago. His smoking use included Cigarettes. He started smoking about 45 years ago. He has a 8 pack-year smoking history. He has never used smokeless tobacco. He reports that he does not drink alcohol or use illicit drugs.   Allergies  Allergen Reactions  . Bacitracin Other (See Comments)    unknown  . Norvasc [Amlodipine Besylate] Other (See Comments)    unknown    Prior to Admission medications   Medication Sig Start Date End Date Taking? Authorizing Provider  albuterol (PROVENTIL) (2.5 MG/3ML) 0.083% nebulizer solution Take 3 mLs (2.5 mg total) by nebulization every 2 (two) hours as needed for wheezing. 02/21/14  Yes Kinnie Feil, MD  allopurinol (ZYLOPRIM) 100 MG tablet Take 100 mg by mouth daily.  05/15/11  Yes Historical Provider, MD  amiodarone (PACERONE) 200 MG tablet Take 1 tablet (200 mg total) by mouth daily. 08/31/14  Yes Satira Sark, MD  B Complex-C-Zn-Folic Acid (DIALYVITE/ZINC PO) Take by mouth 3 (three) times a week. Patient has dialysis on Tuesdays, Thursdays, and Saturdays   Yes Historical Provider, MD  FLUoxetine (PROZAC) 20 MG capsule Take 20 mg by mouth daily. 09/21/14  Yes Historical Provider, MD  folic acid (FOLVITE) 1 MG tablet Take 1 mg by mouth daily.     Yes Historical Provider, MD  insulin  glargine (LANTUS) 100 UNIT/ML injection Inject 10 Units into the skin at bedtime.   Yes Historical Provider, MD  metoprolol (LOPRESSOR) 50 MG tablet Take 75 mg by mouth 2 (two) times daily. 07/04/14  Yes Historical Provider, MD  midodrine (PROAMATINE) 10 MG tablet Take 1 tablet (10 mg total) by mouth 2 (two) times daily with a meal. 08/20/14  Yes Silver Huguenin Elgergawy, MD  pantoprazole (PROTONIX) 40 MG tablet Take 40 mg by mouth 2 (two) times daily.  05/25/11  Yes Historical Provider, MD  RENVELA 800 MG tablet Take 800 mg by mouth 3 (three) times daily with meals.  05/03/11  Yes Historical Provider, MD  SENSIPAR 30 MG tablet Take 30 mg by mouth daily. 01/22/14  Yes Historical Provider, MD  traMADol (ULTRAM) 50 MG tablet Take by mouth every 8 (eight) hours as needed for moderate pain.   Yes Historical Provider, MD  traZODone (DESYREL) 50 MG tablet Take 1 tablet (50 mg total) by mouth at bedtime. 08/13/13  Yes Nita Sells, MD  warfarin (COUMADIN) 1 MG tablet Take 1/2 tablet daily Patient taking differently: Take 1/2 tablet qhs 09/08/14  Yes Satira Sark, MD  HYDROcodone-acetaminophen (NORCO/VICODIN) 5-325 MG per tablet Take 1 tablet by mouth every 6 (six) hours as needed for moderate pain. 08/06/14   Carole Civil, MD    AFB:XUXYBFXOV, ondansetron **OR** ondansetron [facility]) IV, traMADol  Results for orders placed or performed during the hospital encounter of 11/09/14 (from the past 48 hour(s))  CBC with Differential     Status: Abnormal   Collection Time: 11/09/14 10:55 AM  Result Value Ref Range   WBC 7.9 4.0 - 10.5 K/uL   RBC 3.51 (L) 4.22 - 5.81 MIL/uL   Hemoglobin 10.0 (L) 13.0 - 17.0 g/dL   HCT 33.0 (L) 39.0 - 52.0 %   MCV 94.0 78.0 - 100.0 fL   MCH 28.5 26.0 - 34.0 pg   MCHC 30.3 30.0 - 36.0 g/dL   RDW 16.8 (H) 11.5 - 15.5 %   Platelets 238 150 - 400 K/uL   Neutrophils Relative % 77 43 - 77 %   Neutro Abs 6.1 1.7 - 7.7 K/uL   Lymphocytes Relative 11 (L) 12 - 46 %   Lymphs  Abs 0.9 0.7 - 4.0 K/uL   Monocytes Relative 9 3 - 12 %   Monocytes Absolute 0.7 0.1 - 1.0 K/uL   Eosinophils Relative 3 0 - 5 %   Eosinophils Absolute 0.3 0.0 - 0.7 K/uL   Basophils Relative 0 0 - 1 %   Basophils Absolute 0.0 0.0 - 0.1 K/uL  Basic metabolic panel     Status: Abnormal   Collection Time: 11/09/14 10:55 AM  Result Value Ref Range   Sodium 140 135 - 145 mmol/L   Potassium 4.4 3.5 - 5.1 mmol/L   Chloride 100 96 - 112 mmol/L   CO2 28 19 - 32 mmol/L   Glucose, Bld 92 70 - 99 mg/dL   BUN 30 (H) 6 - 23 mg/dL   Creatinine, Ser 6.85 (H) 0.50 - 1.35 mg/dL   Calcium 7.8 (L) 8.4 - 10.5 mg/dL   GFR calc non Af Amer 8 (L) >90 mL/min   GFR calc Af Amer 9 (L) >90 mL/min    Comment: (NOTE) The eGFR has been calculated using the CKD EPI equation. This calculation has not been validated in all clinical situations. eGFR's persistently <90 mL/min signify possible Chronic Kidney Disease.    Anion gap 12 5 - 15  Troponin I     Status: None   Collection Time: 11/09/14 10:55 AM  Result Value Ref Range   Troponin I <0.03 <0.031 ng/mL    Comment:        NO INDICATION OF MYOCARDIAL INJURY.   Protime-INR     Status: Abnormal   Collection Time: 11/09/14 10:55 AM  Result Value Ref Range   Prothrombin Time 16.4 (H) 11.6 - 15.2 seconds   INR 1.31 0.00 - 1.49  CBG monitoring, ED     Status: Abnormal   Collection Time: 11/09/14  5:28 PM  Result Value Ref Range   Glucose-Capillary 159 (H) 70 - 99 mg/dL  MRSA PCR Screening     Status: Abnormal   Collection Time: 11/09/14  7:07 PM  Result Value Ref Range   MRSA by PCR POSITIVE (A) NEGATIVE    Comment:  The GeneXpert MRSA Assay (FDA approved for NASAL specimens only), is one component of a comprehensive MRSA colonization surveillance program. It is not intended to diagnose MRSA infection nor to guide or monitor treatment for MRSA infections. RESULT CALLED TO, READ BACK BY AND VERIFIED WITH: SPRADLING,S RN [id number] 11/09/14  MITCHELL,L   Glucose, capillary     Status: Abnormal   Collection Time: 11/09/14 11:56 PM  Result Value Ref Range   Glucose-Capillary 126 (H) 70 - 99 mg/dL  Glucose, capillary     Status: Abnormal   Collection Time: 11/10/14  8:14 AM  Result Value Ref Range   Glucose-Capillary 111 (H) 70 - 99 mg/dL  Glucose, capillary     Status: Abnormal   Collection Time: 11/10/14 11:19 AM  Result Value Ref Range   Glucose-Capillary 106 (H) 70 - 99 mg/dL     ROS: see hpi for pos  Physical Exam: Filed Vitals:   11/10/14 1223  BP: 135/75  Pulse: 77  Temp:   Resp: 18     General: Alert pleasant Elderly BM NAD/ appropriate HEENT: Conception, non icteric/ MMM Neck:no jvd Heart: RRR no mur, rub or gallop Lungs: Decreased bs R side / Left CTA/ R Chest tube in place Abdomen:  BS +, soft, NT, ND Extremities: R AKA/ L 1+ pedal edema Skin: no overt rash, warm dry Neuro: OX3 ,alert, moves all extrem. Dialysis Access: Pos . Bruit  R FA AVF  Dialysis Orders: Center: [facility]   on TTS . EDW 86kg HD Bath 2k  Time 210 min Heparin 0. Access  R A AVF BFR 400 DFR 600    Zemplar 0 mcg IV/HD Epogen 8000   Units IV/HD  Venofer 26m weekly  Assessment/Plan 1. Recurrent R Pl. Effusion- sp Chest tube and Thoracentesis wu per admit 2. ESRD -  HD TTS K 4.4 via AVF 3. Hypertension/volume  - 135/75 bp / edw adjust  Down  Sp thoracentesis and hd today / on midodrine 158mbid  4. Anemia  - esa / fe  hgb 10.0 5. Metabolic bone disease -  Vit d and binder renvela / sensipar 6. Chronic A . Fib- On coumadin/ amiodarone, lopressor / coumadin  held with  CT placement 7. DM - per admit 8. HO MVR Bioprosthetic/ CM/ CABG/ R AKA  DaErnest HaberPA-C [phone]/15/2016, 1:00 PM   Patient seen and examined, agree with above note with above modifications. Chronically ill male with ESRD with recent PNA associated with pleural effusion requiring thoracentesis.  He now presents with recurrent  pleural effusion s/p chest tube with drainage of a liter of purulent material.  Today is his dialysis day so will be done later today KeCorliss ParishMD 11/10/2014

## 2014-11-10 NOTE — Procedures (Signed)
Technically successful CT guided right sided 14 Fr chest tube placement yielding 1 L of brown colored purulent material.  Samples sent to laboratory.  CT connected to pleur-vac.  No immediate post procedural complications.

## 2014-11-10 NOTE — Sedation Documentation (Signed)
Tried to call report x 2 to [place].  S/w Lauren but unable to give report.

## 2014-11-10 NOTE — Progress Notes (Signed)
Utilization review completed.  

## 2014-11-10 NOTE — Progress Notes (Signed)
[facility]  Patient Status: Active (receiving services up to time of hospitalization)  [facility] is providing the following services: PT  If patient discharges after hours, please call [phone].   [name] [name] 11/10/2014, 12:28 PM

## 2014-11-10 NOTE — Sedation Documentation (Signed)
Patient is resting comfortably. 

## 2014-11-10 NOTE — Progress Notes (Deleted)
Patient ID: [name] [name], male   DOB: Aug 13, 1951, 64 y.o.   MRN: [id number] EVENING ROUNDS NOTE :     [address]       [place] [address]             [phone]                   Procedure(s) (LRB): VIDEO BRONCHOSCOPY (N/A) VIDEO ASSISTED THORACOSCOPY (Right) DRAINAGE OF PLEURAL EFFUSION (Right) PLEURAL BIOPSY (N/A)  Total Length of Stay:  LOS: 1 day  BP 95/42 mmHg  Pulse 61  Temp(Src) 97.4 F (36.3 C) (Oral)  Resp 17  Ht 6\' 4"  (1.93 m)  Wt 192 lb 10.9 oz (87.4 kg)  BMI 23.46 kg/m2  SpO2 98%  .Intake/Output      03/14 0701 - 03/15 0700 03/15 0701 - 03/16 0700   I.V. (mL/kg) 3 (0)    Total Intake(mL/kg) 3 (0)    Chest Tube  1030   Total Output   1030   Net +3 -1030              Lab Results  Component Value Date   WBC 9.2 11/10/2014   HGB 10.2* 11/10/2014   HCT 33.0* 11/10/2014   PLT 295 11/10/2014   GLUCOSE 141* 11/10/2014   CHOL 60 03/02/2014   TRIG 54 03/02/2014   HDL 27* 03/02/2014   LDLCALC 22 03/02/2014   ALT 14 09/29/2014   AST 29 09/29/2014   NA 136 11/10/2014   K 5.8* 11/10/2014   CL 97 11/10/2014   CREATININE 8.35* 11/10/2014   BUN 42* 11/10/2014   CO2 21 11/10/2014   TSH 1.210 08/13/2014   INR 1.31 11/09/2014   HGBA1C 5.9* 03/02/2014   Stable post op minimal drainage    Grace Isaac MD  Beeper [phone] Office [phone] 11/10/2014 6:59 PM

## 2014-11-10 NOTE — Care Management Note (Signed)
    Page 1 of 2   11/19/2014     4:22:46 PM CARE MANAGEMENT NOTE 11/19/2014  Patient:  [name] [name], [name] [name]   Account Number:  [id number]  Date Initiated:  11/10/2014  Documentation initiated by:  Marvetta Gibbons  Subjective/Objective Assessment:   Pt admitted with pl. effusion     Action/Plan:   PTA pt lived at home- active with [facility] for HH-PT - will need resumption order for discharge- NCM to follow   Anticipated DC Date:  11/18/2014   Anticipated DC Plan:  [place]  CM consult      [facility]  Resumption Of Svcs/PTA Provider   Choice offered to / List presented to:  C-1 Patient        [place] arranged  HH-1 RN  [place].   Status of service:  Completed, signed off Medicare Important Message given?  YES (If response is "NO", the following Medicare IM given date fields will be blank) Date Medicare IM given:  11/13/2014 Medicare IM given by:  Marvetta Gibbons Date Additional Medicare IM given:  11/19/2014 Additional Medicare IM given by:  Marvetta Gibbons  Discharge Disposition:  [place]  Per UR Regulation:  Reviewed for med. necessity/level of care/duration of stay  If discussed at [place] of Stay Meetings, dates discussed:   11/17/2014  11/19/2014    Comments:  11/19/14- 1300- Marvetta Gibbons RN, BSN [phone] Pt for d/c home today with lovenox- will resume HH with [facility] and add RN for INR checks friday and Sunday- spoke with Lelan Pons with [facility] regarding [facility] needs and resumption of care. Spoke with pt at bedside who is agreeable to [facility] plans

## 2014-11-11 ENCOUNTER — Inpatient Hospital Stay (HOSPITAL_COMMUNITY): Payer: Medicare Other

## 2014-11-11 DIAGNOSIS — D638 Anemia in other chronic diseases classified elsewhere: Secondary | ICD-10-CM

## 2014-11-11 DIAGNOSIS — J90 Pleural effusion, not elsewhere classified: Secondary | ICD-10-CM | POA: Insufficient documentation

## 2014-11-11 LAB — GLUCOSE, CAPILLARY
Glucose-Capillary: 103 mg/dL — ABNORMAL HIGH (ref 70–99)
Glucose-Capillary: 154 mg/dL — ABNORMAL HIGH (ref 70–99)
Glucose-Capillary: 147 mg/dL — ABNORMAL HIGH (ref 70–99)
Glucose-Capillary: 91 mg/dL (ref 70–99)

## 2014-11-11 LAB — BASIC METABOLIC PANEL
Anion gap: 12 (ref 5–15)
BUN: 18 mg/dL (ref 6–23)
CO2: 27 mmol/L (ref 19–32)
Calcium: 7.8 mg/dL — ABNORMAL LOW (ref 8.4–10.5)
Chloride: 98 mmol/L (ref 96–112)
Creatinine, Ser: 5.16 mg/dL — ABNORMAL HIGH (ref 0.50–1.35)
GFR calc non Af Amer: 11 mL/min — ABNORMAL LOW (ref >90)
GFR, EST AFRICAN AMERICAN: 12 mL/min — AB (ref >90)
Glucose, Bld: 99 mg/dL (ref 70–99)
Potassium: 4.5 mmol/L (ref 3.5–5.1)
SODIUM: 137 mmol/L (ref 135–145)

## 2014-11-11 LAB — CBC
HEMATOCRIT: 34.6 % — AB (ref 39.0–52.0)
Hemoglobin: 10.6 g/dL — ABNORMAL LOW (ref 13.0–17.0)
MCH: 27.7 pg (ref 26.0–34.0)
MCHC: 30.6 g/dL (ref 30.0–36.0)
MCV: 90.3 fL (ref 78.0–100.0)
PLATELETS: 243 10*3/uL (ref 150–400)
RBC: 3.83 MIL/uL — ABNORMAL LOW (ref 4.22–5.81)
RDW: 16.4 % — AB (ref 11.5–15.5)
WBC: 8.8 10*3/uL (ref 4.0–10.5)

## 2014-11-11 LAB — HEPARIN LEVEL (UNFRACTIONATED): Heparin Unfractionated: 0.2 IU/mL — ABNORMAL LOW (ref 0.30–0.70)

## 2014-11-11 MED ORDER — HEPARIN (PORCINE) IN NACL 100-0.45 UNIT/ML-% IJ SOLN
1450.0000 [IU]/h | INTRAMUSCULAR | Status: DC
  Administered 2014-11-11: 1250 [IU]/h via INTRAVENOUS
  Administered 2014-11-12: 1450 [IU]/h via INTRAVENOUS
  Filled 2014-11-11 (×2): qty 250

## 2014-11-11 MED ORDER — HEPARIN BOLUS VIA INFUSION
2000.0000 [IU] | Freq: Once | INTRAVENOUS | Status: AC
  Administered 2014-11-11: 2000 [IU] via INTRAVENOUS
  Filled 2014-11-11: qty 2000

## 2014-11-11 MED ORDER — RENA-VITE PO TABS
1.0000 | ORAL_TABLET | Freq: Every day | ORAL | Status: DC
  Administered 2014-11-11 – 2014-11-18 (×8): 1 via ORAL
  Filled 2014-11-11 (×10): qty 1

## 2014-11-11 MED ORDER — HEPARIN BOLUS VIA INFUSION
1250.0000 [IU] | Freq: Once | INTRAVENOUS | Status: AC
  Administered 2014-11-11: 1250 [IU] via INTRAVENOUS
  Filled 2014-11-11: qty 1250

## 2014-11-11 MED ORDER — METOPROLOL TARTRATE 50 MG PO TABS
50.0000 mg | ORAL_TABLET | Freq: Two times a day (BID) | ORAL | Status: DC
  Administered 2014-11-11: 50 mg via ORAL
  Filled 2014-11-11 (×3): qty 1

## 2014-11-11 NOTE — Progress Notes (Signed)
Referring Physician(s): Dr Servando Snare  Subjective:  R recurrent loculated pleural effusion Rt chest tube drain placed 3/15 Chest tube was off this am---(stop cock was in off position) No output overnight  Allergies: Bacitracin and Norvasc  Medications: Prior to Admission medications   Medication Sig Start Date End Date Taking? Authorizing Provider  albuterol (PROVENTIL) (2.5 MG/3ML) 0.083% nebulizer solution Take 3 mLs (2.5 mg total) by nebulization every 2 (two) hours as needed for wheezing. 02/21/14  Yes Kinnie Feil, MD  allopurinol (ZYLOPRIM) 100 MG tablet Take 100 mg by mouth daily.  05/15/11  Yes Historical Provider, MD  amiodarone (PACERONE) 200 MG tablet Take 1 tablet (200 mg total) by mouth daily. 08/31/14  Yes Satira Sark, MD  B Complex-C-Zn-Folic Acid (DIALYVITE/ZINC PO) Take by mouth 3 (three) times a week. Patient has dialysis on Tuesdays, Thursdays, and Saturdays   Yes Historical Provider, MD  FLUoxetine (PROZAC) 20 MG capsule Take 20 mg by mouth daily. 09/21/14  Yes Historical Provider, MD  folic acid (FOLVITE) 1 MG tablet Take 1 mg by mouth daily.     Yes Historical Provider, MD  insulin glargine (LANTUS) 100 UNIT/ML injection Inject 10 Units into the skin at bedtime.   Yes Historical Provider, MD  metoprolol (LOPRESSOR) 50 MG tablet Take 75 mg by mouth 2 (two) times daily. 07/04/14  Yes Historical Provider, MD  midodrine (PROAMATINE) 10 MG tablet Take 1 tablet (10 mg total) by mouth 2 (two) times daily with a meal. 08/20/14  Yes Silver Huguenin Elgergawy, MD  pantoprazole (PROTONIX) 40 MG tablet Take 40 mg by mouth 2 (two) times daily.  05/25/11  Yes Historical Provider, MD  RENVELA 800 MG tablet Take 800 mg by mouth 3 (three) times daily with meals.  05/03/11  Yes Historical Provider, MD  SENSIPAR 30 MG tablet Take 30 mg by mouth daily. 01/22/14  Yes Historical Provider, MD  traMADol (ULTRAM) 50 MG tablet Take by mouth every 8 (eight) hours as needed for moderate pain.    Yes Historical Provider, MD  traZODone (DESYREL) 50 MG tablet Take 1 tablet (50 mg total) by mouth at bedtime. 08/13/13  Yes Nita Sells, MD  warfarin (COUMADIN) 1 MG tablet Take 1/2 tablet daily Patient taking differently: Take 1/2 tablet qhs 09/08/14  Yes Satira Sark, MD  HYDROcodone-acetaminophen (NORCO/VICODIN) 5-325 MG per tablet Take 1 tablet by mouth every 6 (six) hours as needed for moderate pain. 08/06/14   Carole Civil, MD     Vital Signs: BP 120/42 mmHg  Pulse 84  Temp(Src) 98.3 F (36.8 C) (Oral)  Resp 22  Ht 6\' 4"  (1.93 m)  Wt 85.4 kg (188 lb 4.4 oz)  BMI 22.93 kg/m2  SpO2 93%  Physical Exam  Pulmonary/Chest: Effort normal. He has no wheezes.  Skin:  Site of Rt chest tube drain clean and dry NT; no bleeding   Nursing note and vitals reviewed. opened stop cock to drain and immediately 300 cc of brown fluid was drained into Pleur vac  Imaging: Dg Chest 2 View  11/09/2014   CLINICAL DATA:  Shortness of breath  EXAM: CHEST  2 VIEW  COMPARISON:  11/09/2014  FINDINGS: Unchanged cardiomegaly. Aortic and hilar contours or stable from prior. Patient is status post CABG and mitral valve replacement.  No interstitial coarsening with improved lung volumes. There is a moderate right pleural effusion with posterior loculation. The appearance is stable from two-view chest x-ray 08/13/2014, a peripherally enhancing loculated effusion based on  abdominal CT 08/13/2014. Prominent right hilar opacity persists without evidence of hilar adenopathy or mass in the lateral projection. This appearance is likely from the pleural fluid.  Remote L1 compression fracture with advanced height loss. No acute osseous findings.  IMPRESSION: 1. Chronic, loculated right pleural effusion with atelectasis. 2. No evidence for pulmonary edema on this better inflated study.   Electronically Signed   By: Monte Fantasia M.D.   On: 11/09/2014 10:52   Ct Chest W Contrast  11/09/2014   CLINICAL  DATA:  Shortness of breath.  Productive cough.  EXAM: CT CHEST WITH CONTRAST  TECHNIQUE: Multidetector CT imaging of the chest was performed during intravenous contrast administration.  CONTRAST:  91mL OMNIPAQUE IOHEXOL 300 MG/ML  SOLN  COMPARISON:  12/06/2011  FINDINGS: THORACIC INLET/BODY WALL:  No acute abnormality.  MEDIASTINUM:  Stable cardiomegaly, with especially prominent left atrial enlargement. The patient is status post mitral valve replacement and CABG. Portions of the LIMA and venous grafts which are large enough to evaluate are enhancing. No aortic aneurysm or dissection. No evidence of pulmonary embolism. Adenopathy stable to decreased from 2013, with right lower peritracheal and subcarinal nodes remaining the most prominent. A 15 mm metallic body (with the appearance of stent) within a right upper lobe pulmonary artery is stable from previous. There is no associated vessel occlusion.  LUNG WINDOWS:  Chronic loculated pleural effusion in the posterior and lower right chest with thick enhancing rim. The fluid collection is large, measuring 18 cm in craniocaudal span by 10 cm in thickness. The appearance is stable compared to visible portions on abdominal CT 08/13/2014. Based on abdominal CT favor remote hemothorax. Opacification along the chronic pleural effusion has heterogeneous enhancement, swirling interstitium, and subpleural rounded morphology. This is predominantly within the right lower lobe, although also present along the posterior right upper lobe and right middle lobe. There is small, loculated pleural effusion in the anterior and upper right hemi thorax without pleural nodularity or enhancement in this location. Trace layering left pleural effusion. Stable left lower lobe pulmonary nodule at 6 mm. Given stability from 2013 this is considered benign. Mild apical emphysema and air trapping.  UPPER ABDOMEN:  Hepatic fissure enlargement and subtle liver surface nodularity could reflect early  cirrhosis. Cholelithiasis without evidence of acute cholecystitis.  OSSEOUS:  Remote L1 compression fracture with greater than 75% height loss. Remote left posterior rib fractures.  IMPRESSION: 1. Chronic large and complex right pleural effusion, remote hemothorax based on abdominal CT 02/27/2014. 2. Collapsed and distorted right lower lobe secondary to #1, likely with developing rounded atelectasis. Cannot exclude superimposed infection. 3. Indeterminate, small loculated pleural effusion in the right upper chest. 4. Multiple incidental findings are stable from 2013 and noted above.   Electronically Signed   By: Monte Fantasia M.D.   On: 11/09/2014 12:47   Dg Chest Portable 1 View  11/09/2014   CLINICAL DATA:  Shortness of breath for 1 week.  Dialysis.  EXAM: PORTABLE CHEST - 1 VIEW  COMPARISON:  08/16/2014  FINDINGS: There is chronic cardiomegaly post CABG and mitral valve replacement. Aortic contours are stable from prior. Pulmonary venous congestion and mild pulmonary edema with a large right pleural effusion. New circumscribed opacity in the right hilum, likely fissural fluid.  IMPRESSION: 1. Pulmonary edema with chronic moderate right pleural effusion. 2. A right hilar opacity is likely fissural fluid. When clinically able recommend two-view chest x-ray.   Electronically Signed   By: Monte Fantasia M.D.   On:  11/09/2014 10:01   Ct Perc Pleural Drain W/indwell Cath W/img Guide  11/10/2014   INDICATION: Recurrent symptomatic right-sided pleural effusion - as patient has multiple medical comorbidities and is a poor operative candidate, request made by consulting thoracic surgeon, Dr. Servando Snare, for placement of a CT-guided large bore catheter.  EXAM: CT PERC PLEURAL DRAIN W/INDWELL CATH W/IMG GUIDE  COMPARISON:  Chest CT - 11/09/2014; ultrasound-guided right-sided thoracentesis - 08/16/2014  MEDICATIONS: The patient is currently admitted to the hospital and receiving intravenous antibiotics. The antibiotics  were administered within an appropriate time frame prior to the initiation of the procedure.  ANESTHESIA/SEDATION: Fentanyl 50 mcg IV; Versed 1 mg IV  Total Moderate Sedation time  22 minutes  CONTRAST:  None  COMPLICATIONS: None immediate  PROCEDURE: Informed written consent was obtained from the patient after a discussion of the risks, benefits and alternatives to treatment. The patient was placed supine, slightly LPO on the CT gantry and a pre procedural CT was performed re-demonstrating the known complex right-sided pleural effusion. The procedure was planned. A timeout was performed prior to the initiation of the procedure.  The right lateral chest was prepped and draped in the usual sterile fashion. The overlying soft tissues were anesthetized with 1% lidocaine with epinephrine. Appropriate trajectory was planned with the use of a 22 gauge spinal needle. An 18 gauge trocar needle was advanced into the abscess/fluid collection and a short Amplatz super stiff wire was coiled within the collection. Appropriate positioning was confirmed with a limited CT scan. The tract was serially dilated allowing placement of a 14 French all-purpose drainage catheter. Appropriate positioning was confirmed with a limited postprocedural CT scan.  Approximately 1 L foul smelling brown fluid was aspirated from the right pleural space. Following removal for approximately 1 L of purulent material, limited CT scanning was performed demonstrating development of a small loculated ex vacuo pneumothorax within the right basilar pleural space. The tube was connected to a pleural vac and sutured in place. A dressing was placed. The patient tolerated the procedure well without immediate post procedural complication.  IMPRESSION: Successful CT guided placement of a 110 French all purpose drain catheter into the right pleural space with aspiration of 1 L of purulent, brown-colored fluid. Samples were sent to the laboratory as requested by the  ordering clinical team.   Electronically Signed   By: Sandi Mariscal M.D.   On: 11/10/2014 16:23    Labs:  CBC:  Recent Labs  09/29/14 1257 11/09/14 1055 11/10/14 1700 11/11/14 0437  WBC 16.2* 7.9 9.2 8.8  HGB 10.2* 10.0* 10.2* 10.6*  HCT 33.8* 33.0* 33.0* 34.6*  PLT 308 238 295 243    COAGS:  Recent Labs  10/08/14 1240 10/08/14 1243 10/19/14 1712 11/09/14 1055  INR 1.8 1.8 1.8 1.31    BMP:  Recent Labs  09/29/14 1257 11/09/14 1055 11/10/14 1700 11/11/14 0437  NA 138 140 136 137  K 3.8 4.4 5.8* 4.5  CL 99 100 97 98  CO2 28 28 21 27   GLUCOSE 224* 92 141* 99  BUN 18 30* 42* 18  CALCIUM 7.9* 7.8* 7.7* 7.8*  CREATININE 3.92* 6.85* 8.35* 5.16*  GFRNONAA 15* 8* 6* 11*  GFRAA 17* 9* 7* 12*    LIVER FUNCTION TESTS:  Recent Labs  02/27/14 2022  08/13/14 0403 08/15/14 0245 08/16/14 1616 08/17/14 0320 08/17/14 1022 09/29/14 1257 11/10/14 1700  BILITOT 1.2  --  1.5* 2.0*  --   --   --  1.7*  --  AST 19  --  34 34  --   --   --  29  --   ALT 10  --  20 24  --   --   --  14  --   ALKPHOS 92  --  168* 151*  --   --   --  127*  --   PROT 7.0  --  7.1 6.5 6.6  --   --  8.2  --   ALBUMIN 2.1*  < > 2.5* 2.1*  --  1.9* 2.0* 2.3* 2.4*  < > = values in this interval not displayed.  Assessment and Plan:  Rt chest tube drain intact Working well now Will follow Plan per Dr Servando Snare  Signed: Monia Sabal A 11/11/2014, 9:31 AM   I spent a total of 15 Minutes in face to face in clinical consultation/evaluation, greater than 50% of which was counseling/coordinating care for Rt chest tube drain

## 2014-11-11 NOTE — Progress Notes (Signed)
Subjective: Interval History: has no complaint, feels better with CT.  Objective: Vital signs in last 24 hours: Temp:  [97.3 F (36.3 C)-98.3 F (36.8 C)] 98.3 F (36.8 C) (03/16 1324) Pulse Rate:  [61-89] 84 (03/16 0608) Resp:  [17-27] 22 (03/16 0608) BP: (90-145)/(42-91) 120/42 mmHg (03/16 0608) SpO2:  [93 %-100 %] 93 % (03/16 0608) Weight:  [85.4 kg (188 lb 4.4 oz)-87.4 kg (192 lb 10.9 oz)] 85.4 kg (188 lb 4.4 oz) (03/15 2026) Weight change: -3.319 kg (-7 lb 5.1 oz)  Intake/Output from previous day: 03/15 0701 - 03/16 0700 In: -  Out: 3030 [Chest Tube:1030] Intake/Output this shift: Total I/O In: 120 [P.O.:120] Out: -   General appearance: alert, cooperative and no distress Resp: diminished breath sounds bilaterally and rales RLL Chest wall: R CT Cardio: S1, S2 normal and systolic murmur: systolic ejection 2/6, decrescendo at 2nd left intercostal space GI: pos bs, soft Extremities: AVF RLA, R AKA  Lab Results:  Recent Labs  11/10/14 1700 11/11/14 0437  WBC 9.2 8.8  HGB 10.2* 10.6*  HCT 33.0* 34.6*  PLT 295 243   BMET:  Recent Labs  11/10/14 1700 11/11/14 0437  NA 136 137  K 5.8* 4.5  CL 97 98  CO2 21 27  GLUCOSE 141* 99  BUN 42* 18  CREATININE 8.35* 5.16*  CALCIUM 7.7* 7.8*   No results for input(s): PTH in the last 72 hours. Iron Studies: No results for input(s): IRON, TIBC, TRANSFERRIN, FERRITIN in the last 72 hours.  Studies/Results: Dg Chest 2 View  11/09/2014   CLINICAL DATA:  Shortness of breath  EXAM: CHEST  2 VIEW  COMPARISON:  11/09/2014  FINDINGS: Unchanged cardiomegaly. Aortic and hilar contours or stable from prior. Patient is status post CABG and mitral valve replacement.  No interstitial coarsening with improved lung volumes. There is a moderate right pleural effusion with posterior loculation. The appearance is stable from two-view chest x-ray 08/13/2014, a peripherally enhancing loculated effusion based on abdominal CT 08/13/2014.  Prominent right hilar opacity persists without evidence of hilar adenopathy or mass in the lateral projection. This appearance is likely from the pleural fluid.  Remote L1 compression fracture with advanced height loss. No acute osseous findings.  IMPRESSION: 1. Chronic, loculated right pleural effusion with atelectasis. 2. No evidence for pulmonary edema on this better inflated study.   Electronically Signed   By: Monte Fantasia M.D.   On: 11/09/2014 10:52   Ct Chest W Contrast  11/09/2014   CLINICAL DATA:  Shortness of breath.  Productive cough.  EXAM: CT CHEST WITH CONTRAST  TECHNIQUE: Multidetector CT imaging of the chest was performed during intravenous contrast administration.  CONTRAST:  73mL OMNIPAQUE IOHEXOL 300 MG/ML  SOLN  COMPARISON:  12/06/2011  FINDINGS: THORACIC INLET/BODY WALL:  No acute abnormality.  MEDIASTINUM:  Stable cardiomegaly, with especially prominent left atrial enlargement. The patient is status post mitral valve replacement and CABG. Portions of the LIMA and venous grafts which are large enough to evaluate are enhancing. No aortic aneurysm or dissection. No evidence of pulmonary embolism. Adenopathy stable to decreased from 2013, with right lower peritracheal and subcarinal nodes remaining the most prominent. A 15 mm metallic body (with the appearance of stent) within a right upper lobe pulmonary artery is stable from previous. There is no associated vessel occlusion.  LUNG WINDOWS:  Chronic loculated pleural effusion in the posterior and lower right chest with thick enhancing rim. The fluid collection is large, measuring 18 cm in craniocaudal  span by 10 cm in thickness. The appearance is stable compared to visible portions on abdominal CT 08/13/2014. Based on abdominal CT favor remote hemothorax. Opacification along the chronic pleural effusion has heterogeneous enhancement, swirling interstitium, and subpleural rounded morphology. This is predominantly within the right lower lobe,  although also present along the posterior right upper lobe and right middle lobe. There is small, loculated pleural effusion in the anterior and upper right hemi thorax without pleural nodularity or enhancement in this location. Trace layering left pleural effusion. Stable left lower lobe pulmonary nodule at 6 mm. Given stability from 2013 this is considered benign. Mild apical emphysema and air trapping.  UPPER ABDOMEN:  Hepatic fissure enlargement and subtle liver surface nodularity could reflect early cirrhosis. Cholelithiasis without evidence of acute cholecystitis.  OSSEOUS:  Remote L1 compression fracture with greater than 75% height loss. Remote left posterior rib fractures.  IMPRESSION: 1. Chronic large and complex right pleural effusion, remote hemothorax based on abdominal CT 02/27/2014. 2. Collapsed and distorted right lower lobe secondary to #1, likely with developing rounded atelectasis. Cannot exclude superimposed infection. 3. Indeterminate, small loculated pleural effusion in the right upper chest. 4. Multiple incidental findings are stable from 2013 and noted above.   Electronically Signed   By: Monte Fantasia M.D.   On: 11/09/2014 12:47   Ct Perc Pleural Drain W/indwell Cath W/img Guide  11/10/2014   INDICATION: Recurrent symptomatic right-sided pleural effusion - as patient has multiple medical comorbidities and is a poor operative candidate, request made by consulting thoracic surgeon, Dr. Servando Snare, for placement of a CT-guided large bore catheter.  EXAM: CT PERC PLEURAL DRAIN W/INDWELL CATH W/IMG GUIDE  COMPARISON:  Chest CT - 11/09/2014; ultrasound-guided right-sided thoracentesis - 08/16/2014  MEDICATIONS: The patient is currently admitted to the hospital and receiving intravenous antibiotics. The antibiotics were administered within an appropriate time frame prior to the initiation of the procedure.  ANESTHESIA/SEDATION: Fentanyl 50 mcg IV; Versed 1 mg IV  Total Moderate Sedation time  22  minutes  CONTRAST:  None  COMPLICATIONS: None immediate  PROCEDURE: Informed written consent was obtained from the patient after a discussion of the risks, benefits and alternatives to treatment. The patient was placed supine, slightly LPO on the CT gantry and a pre procedural CT was performed re-demonstrating the known complex right-sided pleural effusion. The procedure was planned. A timeout was performed prior to the initiation of the procedure.  The right lateral chest was prepped and draped in the usual sterile fashion. The overlying soft tissues were anesthetized with 1% lidocaine with epinephrine. Appropriate trajectory was planned with the use of a 22 gauge spinal needle. An 18 gauge trocar needle was advanced into the abscess/fluid collection and a short Amplatz super stiff wire was coiled within the collection. Appropriate positioning was confirmed with a limited CT scan. The tract was serially dilated allowing placement of a 14 French all-purpose drainage catheter. Appropriate positioning was confirmed with a limited postprocedural CT scan.  Approximately 1 [name] foul smelling brown fluid was aspirated from the right pleural space. Following removal for approximately 1 [name] of purulent material, limited CT scanning was performed demonstrating development of a small loculated ex vacuo pneumothorax within the right basilar pleural space. The tube was connected to a pleural vac and sutured in place. A dressing was placed. The patient tolerated the procedure well without immediate post procedural complication.  IMPRESSION: Successful CT guided placement of a 1 French all purpose drain catheter into the right pleural space with  aspiration of 1 [name] of purulent, brown-colored fluid. Samples were sent to the laboratory as requested by the ordering clinical team.   Electronically Signed   By: Sandi Mariscal M.D.   On: 11/10/2014 16:23    I have reviewed the patient's current medications.  Assessment/Plan: 1 ESRD  For  HD in am 2 Anemia follow on EPO 3 HPTH meds 4 Empyema post CT with drainage on AB 5 PVD 6 DM controlled 7 CAD P HD, AB, epo, Fe    LOS: 2 days   [name] [name],[name] [name] 11/11/2014,10:23 AM

## 2014-11-11 NOTE — Progress Notes (Signed)
PROGRESS NOTE  [name] [name] UYQ:[id number] DOB: 05/05/1951 DOA: 11/09/2014 PCP: [name], MD  HPI/Recap of past 51 hours: 64 year old male with past medical history of atrial fibrillation, diabetes mellitus and end-stage renal disease on hemodialysis as well as previous MRSA pleural effusion admitted on 3/14 for 3 days of shortness of breath and found to have complex fluid collection in right thorax, transferred to [facility].  Patient seen by cardiothoracic surgery and right sided chest tube placed. No episodes overnight. Chest tube drain accidentally turned off, restarted this morning and draining well. Patient's only complaint today is of soreness on that side at chest tube insertion.  Assessment/Plan: Active Problems:   Atrial fibrillation: On chronic anticoagulation. Discussed with vascular surgery and started on IV heparin. Continue amiodarone and beta blocker   ESRD (end stage renal disease) on dialysis: Nephrology following   DM type 2 causing ESRD: Blood sugars remained stable, under 150   Anemia of chronic disease: Overall stable staying above 10   Recurrent right pleural effusion: Status post chest tube. Appreciate cardiothoracic surgery help    Code Status: Full code  Family Communication: Left message with family  Disposition Plan: Home once chest tube removed   Consultants:  Cardiothoracic surgery  Procedures:  Chest tube placement done 3/15  Antibiotics:  IV vancomycin 3/15-present  IV Zosyn 3/15-present   Objective: BP 111/41 mmHg  Pulse 89  Temp(Src) 98.9 F (37.2 C) (Oral)  Resp 20  Ht 6\' 4"  (1.93 m)  Wt 85.4 kg (188 lb 4.4 oz)  BMI 22.93 kg/m2  SpO2 97%  Intake/Output Summary (Last 24 hours) at 11/11/14 1853 Last data filed at 11/11/14 1800  Gross per 24 hour  Intake 670.63 ml  Output   2550 ml  Net -1879.37 ml   Filed Weights   11/09/14 0941 11/10/14 1640 11/10/14 2026  Weight: 90.719 kg (200 lb) 87.4  kg (192 lb 10.9 oz) 85.4 kg (188 lb 4.4 oz)    Exam:   General:  Alert and oriented 3, no acute distress  Cardiovascular: Irregular rhythm, rate controlled  Respiratory: Clear to auscultation bilaterally  Abdomen: Soft, nontender, nondistended, positive bowel sounds  Musculoskeletal: No clubbing or cyanosis or edema   Data Reviewed: Basic Metabolic Panel:  Recent Labs Lab 11/09/14 1055 11/10/14 1700 11/11/14 0437  NA 140 136 137  K 4.4 5.8* 4.5  CL 100 97 98  CO2 28 21 27   GLUCOSE 92 141* 99  BUN 30* 42* 18  CREATININE 6.85* 8.35* 5.16*  CALCIUM 7.8* 7.7* 7.8*  PHOS  --  5.3*  --    Liver Function Tests:  Recent Labs Lab 11/10/14 1700  ALBUMIN 2.4*   No results for input(s): LIPASE, AMYLASE in the last 168 hours. No results for input(s): AMMONIA in the last 168 hours. CBC:  Recent Labs Lab 11/09/14 1055 11/10/14 1700 11/11/14 0437  WBC 7.9 9.2 8.8  NEUTROABS 6.1  --   --   HGB 10.0* 10.2* 10.6*  HCT 33.0* 33.0* 34.6*  MCV 94.0 90.2 90.3  PLT 238 295 243   Cardiac Enzymes:    Recent Labs Lab 11/09/14 1055  TROPONINI <0.03   BNP (last 3 results) No results for input(s): BNP in the last 8760 hours.  ProBNP (last 3 results)  Recent Labs  08/15/14 0245  PROBNP >70000.0*    CBG:  Recent Labs Lab 11/10/14 0814 11/10/14 1119 11/10/14 2254 11/11/14 0604 11/11/14 1606  GLUCAP 111* 106* 128* 103*  147*    Recent Results (from the past 240 hour(s))  MRSA PCR Screening     Status: Abnormal   Collection Time: 11/09/14  7:07 PM  Result Value Ref Range Status   MRSA by PCR POSITIVE (A) NEGATIVE Final    Comment:        The GeneXpert MRSA Assay (FDA approved for NASAL specimens only), is one component of a comprehensive MRSA colonization surveillance program. It is not intended to diagnose MRSA infection nor to guide or monitor treatment for MRSA infections. RESULT CALLED TO, READ BACK BY AND VERIFIED WITH: SPRADLING,S RN 2243  11/09/14 MITCHELL,L   Culture, routine-abscess     Status: None (Preliminary result)   Collection Time: 11/10/14  2:38 PM  Result Value Ref Range Status   Specimen Description ABSCESS RIGHT PLEURAL  Final   Special Requests NONE  Final   Gram Stain   Final    ABUNDANT WBC PRESENT,BOTH PMN AND MONONUCLEAR NO SQUAMOUS EPITHELIAL CELLS SEEN NO ORGANISMS SEEN Performed at [facility]    Culture NO GROWTH Performed at [facility]   Final   Report Status PENDING  Incomplete     Studies: Ct Perc Pleural Drain W/indwell Cath W/img Guide  11/10/2014   INDICATION: Recurrent symptomatic right-sided pleural effusion - as patient has multiple medical comorbidities and is a poor operative candidate, request made by consulting thoracic surgeon, Dr. Servando Snare, for placement of a CT-guided large bore catheter.  EXAM: CT PERC PLEURAL DRAIN W/INDWELL CATH W/IMG GUIDE  COMPARISON:  Chest CT - 11/09/2014; ultrasound-guided right-sided thoracentesis - 08/16/2014  MEDICATIONS: The patient is currently admitted to the hospital and receiving intravenous antibiotics. The antibiotics were administered within an appropriate time frame prior to the initiation of the procedure.  ANESTHESIA/SEDATION: Fentanyl 50 mcg IV; Versed 1 mg IV  Total Moderate Sedation time  22 minutes  CONTRAST:  None  COMPLICATIONS: None immediate  PROCEDURE: Informed written consent was obtained from the patient after a discussion of the risks, benefits and alternatives to treatment. The patient was placed supine, slightly LPO on the CT gantry and a pre procedural CT was performed re-demonstrating the known complex right-sided pleural effusion. The procedure was planned. A timeout was performed prior to the initiation of the procedure.  The right lateral chest was prepped and draped in the usual sterile fashion. The overlying soft tissues were anesthetized with 1% lidocaine with epinephrine. Appropriate trajectory was planned with  the use of a 22 gauge spinal needle. An 18 gauge trocar needle was advanced into the abscess/fluid collection and a short Amplatz super stiff wire was coiled within the collection. Appropriate positioning was confirmed with a limited CT scan. The tract was serially dilated allowing placement of a 14 French all-purpose drainage catheter. Appropriate positioning was confirmed with a limited postprocedural CT scan.  Approximately 1 L foul smelling brown fluid was aspirated from the right pleural space. Following removal for approximately 1 L of purulent material, limited CT scanning was performed demonstrating development of a small loculated ex vacuo pneumothorax within the right basilar pleural space. The tube was connected to a pleural vac and sutured in place. A dressing was placed. The patient tolerated the procedure well without immediate post procedural complication.  IMPRESSION: Successful CT guided placement of a 3 French all purpose drain catheter into the right pleural space with aspiration of 1 L of purulent, brown-colored fluid. Samples were sent to the laboratory as requested by the ordering clinical team.   Electronically  Signed   By: Sandi Mariscal M.D.   On: 11/10/2014 16:23    Scheduled Meds: . allopurinol  100 mg Oral Daily  . amiodarone  200 mg Oral Daily  . Chlorhexidine Gluconate Cloth  6 each Topical Q0600  . cinacalcet  30 mg Oral Q breakfast  . [START ON 11/12/2014] Darbepoetin Alfa  60 mcg Intravenous Q Thu-HD  . [START ON 11/12/2014] ferric gluconate (FERRLECIT/NULECIT) IV  62.5 mg Intravenous Q Thu-1800  . FLUoxetine  20 mg Oral Daily  . insulin aspart  0-5 Units Subcutaneous QHS  . insulin aspart  0-9 Units Subcutaneous TID WC  . insulin glargine  10 Units Subcutaneous QHS  . metoprolol  50 mg Oral BID  . midodrine  10 mg Oral BID WC  . multivitamin  1 tablet Oral QHS  . mupirocin ointment  1 application Nasal BID  . pantoprazole  40 mg Oral BID  . piperacillin-tazobactam  (ZOSYN)  IV  2.25 g Intravenous Q8H  . sevelamer carbonate  800 mg Oral TID WC  . sodium chloride  3 mL Intravenous Q12H  . sodium chloride  3 mL Intravenous Q12H  . traZODone  50 mg Oral QHS  . [START ON 11/12/2014] vancomycin  1,000 mg Intravenous Q T,Th,Sa-HD    Continuous Infusions: . heparin 1,250 Units/hr (11/11/14 1221)     Time spent: 15 min  [facility] Pager [phone]. If 7PM-7AM, please contact night-coverage at [email or web address], password [facility] 11/11/2014, 6:53 PM  LOS: 2 days

## 2014-11-11 NOTE — Progress Notes (Addendum)
[address]       [place] [address]             [phone]            Procedure(s) (LRB): VIDEO BRONCHOSCOPY (N/A) VIDEO ASSISTED THORACOSCOPY (Right) DRAINAGE OF PLEURAL EFFUSION (Right) PLEURAL BIOPSY (N/A)  Subjective: No complaints this am.  Breathing stable.   Objective: Vital signs in last 24 hours: Patient Vitals for the past 24 hrs:  BP Temp Temp src Pulse Resp SpO2 Weight  11/11/14 0608 (!) 120/42 mmHg 98.3 F (36.8 C) Oral 84 (!) 22 93 % -  11/10/14 2026 99/67 mmHg 97.3 F (36.3 C) Oral 78 (!) 26 95 % 188 lb 4.4 oz (85.4 kg)  11/10/14 2000 - - - 82 - - -  11/10/14 1930 114/83 mmHg - - 76 19 - -  11/10/14 1900 129/78 mmHg - - 78 20 - -  11/10/14 1830 (!) 95/42 mmHg - - 61 17 - -  11/10/14 1800 101/65 mmHg - - 76 18 - -  11/10/14 1730 (!) 145/62 mmHg - - 75 20 - -  11/10/14 1700 90/68 mmHg - - 78 (!) 27 - -  11/10/14 1645 118/72 mmHg - - 87 (!) 21 - -  11/10/14 1640 120/90 mmHg 97.4 F (36.3 C) Oral 78 (!) 27 98 % 192 lb 10.9 oz (87.4 kg)  11/10/14 1429 122/62 mmHg 98 F (36.7 C) Oral 74 18 95 % -  11/10/14 1355 119/60 mmHg - - 83 (!) 21 94 % -  11/10/14 1342 (!) 133/91 mmHg - - 89 18 96 % -  11/10/14 1337 (!) 127/59 mmHg - - 81 17 96 % -  11/10/14 1332 (!) 141/87 mmHg - - 88 19 97 % -  11/10/14 1327 128/67 mmHg - - 85 20 98 % -  11/10/14 1324 (!) 144/88 mmHg - - 86 20 100 % -  11/10/14 1223 135/75 mmHg - - 77 18 99 % -   Current Weight  11/10/14 188 lb 4.4 oz (85.4 kg)     Intake/Output from previous day: 03/15 0701 - 03/16 0700 In: -  Out: 3030 [Chest Tube:1030]    PHYSICAL EXAM:  Heart: RRR Lungs: Decreased BS R base Chest tube: Site stable, no air leak    Lab Results: CBC: Recent Labs  11/10/14 1700 11/11/14 0437  WBC 9.2 8.8  HGB 10.2* 10.6*  HCT 33.0* 34.6*  PLT 295 243   BMET:  Recent Labs  11/10/14 1700 11/11/14 0437  NA 136 137  K 5.8* 4.5  CL 97 98  CO2 21 27  GLUCOSE 141* 99  BUN  42* 18  CREATININE 8.35* 5.16*  CALCIUM 7.7* 7.8*    PT/INR:  Recent Labs  11/09/14 1055  LABPROT 16.4*  INR 1.31      Assessment/Plan: S/P Procedure(s) (LRB): VIDEO BRONCHOSCOPY (N/A) VIDEO ASSISTED THORACOSCOPY (Right) DRAINAGE OF PLEURAL EFFUSION (Right) PLEURAL BIOPSY (N/A) CT with no output documented overnight.  RN examined pt and found stopcock connecting CT to Pleurovac was closed, and when I checked it, the clamp was on.  After everything was opened up, around 325 ml sanguinous fluid drained into Pleurovac without problem.  Presently tube is functioning appropriately.  Continue CT, will follow along. All other management per hospitalist service.   LOS: 2 days    COLLINS,GINA H 11/11/2014  Ct now open, about 400 ml more out  Will get follow up chest  xray Bridge with Lovenox for Afib and tissue mitral valve I have seen and examined Marice Potter and agree with the above assessment  and plan.  Grace Isaac MD Beeper [phone] Office [phone] 11/11/2014 10:02 AM

## 2014-11-11 NOTE — Progress Notes (Addendum)
ANTICOAGULATION CONSULT NOTE - Initial Consult  Pharmacy Consult for Heparin  Indication: atrial fibrillation  Allergies  Allergen Reactions  . Bacitracin Other (See Comments)    unknown  . Norvasc [Amlodipine Besylate] Other (See Comments)    unknown    Patient Measurements: Height: 6\' 4"  (193 cm) Weight: 188 lb 4.4 oz (85.4 kg) IBW/kg (Calculated) : 86.8  Vital Signs: Temp: 98.3 F (36.8 C) (03/16 0608) Temp Source: Oral (03/16 0608) BP: 120/42 mmHg (03/16 0608) Pulse Rate: 84 (03/16 0608)  Labs:  Recent Labs  11/09/14 1055 11/10/14 1700 11/11/14 0437  HGB 10.0* 10.2* 10.6*  HCT 33.0* 33.0* 34.6*  PLT 238 295 243  LABPROT 16.4*  --   --   INR 1.31  --   --   CREATININE 6.85* 8.35* 5.16*  TROPONINI <0.03  --   --     Estimated Creatinine Clearance: 17.2 mL/min (by C-G formula based on Cr of 5.16).   Medical History: Past Medical History  Diagnosis Date  . UGI bleed 02/12/11    On anticoagulation; EGD w/snare polypectomy-multiple polypoid lesions antrum(benign), Chronic active gastritis (NEGATIVE H pylori)  . Anemia, chronic disease   . Sepsis due to enterococcus 02/11/11  . Atrial fibrillation     On coumadin  . Coronary atherosclerosis of native coronary artery     Multivessel status post CABG  . Cardiomyopathy   . Type 2 diabetes mellitus   . Essential hypertension, benign   . Hyperlipemia   . Gout   . S/P patent foramen ovale closure   . Hyperbilirubinemia 08/10/2013  . SBO (small bowel obstruction) 01/2014  . 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 Clostridium difficile colitis 01/2014  . Diverticulosis   . Internal hemorrhoids   . ESRD on hemodialysis     Started 2008-2009. Dr Hinda Lenis on a TTS schedule - R forearm AVF for several years      Medications:  Prescriptions prior to admission  Medication Sig Dispense Refill Last Dose  . albuterol (PROVENTIL) (2.5 MG/3ML) 0.083% nebulizer solution Take 3 mLs (2.5 mg total) by nebulization every 2  (two) hours as needed for wheezing. 75 mL 12 11/06/2014  . allopurinol (ZYLOPRIM) 100 MG tablet Take 100 mg by mouth daily.    11/08/2014 at Unknown time  . amiodarone (PACERONE) 200 MG tablet Take 1 tablet (200 mg total) by mouth daily.   2-3 days  . B Complex-C-Zn-Folic Acid (DIALYVITE/ZINC PO) Take by mouth 3 (three) times a week. Patient has dialysis on Tuesdays, Thursdays, and Saturdays   11/07/2014  . FLUoxetine (PROZAC) 20 MG capsule Take 20 mg by mouth daily.   11/08/2014 at Unknown time  . folic acid (FOLVITE) 1 MG tablet Take 1 mg by mouth daily.     11/08/2014 at Unknown time  . insulin glargine (LANTUS) 100 UNIT/ML injection Inject 10 Units into the skin at bedtime.   11/08/2014 at Unknown time  . metoprolol (LOPRESSOR) 50 MG tablet Take 75 mg by mouth 2 (two) times daily.   11/08/2014 at 830 pm  . midodrine (PROAMATINE) 10 MG tablet Take 1 tablet (10 mg total) by mouth 2 (two) times daily with a meal.   11/08/2014 at Unknown time  . pantoprazole (PROTONIX) 40 MG tablet Take 40 mg by mouth 2 (two) times daily.    11/08/2014 at Unknown time  . RENVELA 800 MG tablet Take 800 mg by mouth 3 (three) times daily with meals.    11/08/2014 at Unknown time  .  SENSIPAR 30 MG tablet Take 30 mg by mouth daily.   11/08/2014 at Unknown time  . traMADol (ULTRAM) 50 MG tablet Take by mouth every 8 (eight) hours as needed for moderate pain.   unknown  . traZODone (DESYREL) 50 MG tablet Take 1 tablet (50 mg total) by mouth at bedtime. 30 tablet 0 11/08/2014 at Unknown time  . warfarin (COUMADIN) 1 MG tablet Take 1/2 tablet daily (Patient taking differently: Take 1/2 tablet qhs) 30 tablet 3 11/08/2014 at Unknown time  . HYDROcodone-acetaminophen (NORCO/VICODIN) 5-325 MG per tablet Take 1 tablet by mouth every 6 (six) hours as needed for moderate pain. 100 tablet 0     Assessment: 40 YOM who presented with a 3 day history of increasing shortness of breath with productive cough. He was on chronic Coumadin PTA for Afib.  Coumadin was held for chest tube placement yesterday. Pharmacy consulted to bridge with IV heparin. Patient received a SQ dose of heparin at ~ 0530 today. H/H remains stable. Plt wnl.   Goal of Therapy:  Heparin level 0.3-0.7 units/ml Monitor platelets by anticoagulation protocol: Yes   Plan:  -Give a smaller heparin IV bolus of 200 units followed by heparin infusion at 1250 units/hr  -F/u 8 hr HL  -Monitor daily CBC, HL and s/s of bleeding -F/u transition to oral anticoagulation    Albertina Parr, PharmD., BCPS Clinical Pharmacist Pager [phone]   Addendum: Heparin level is subtherapeutic at 0.2 on 1250 units/hr. No bleeding noted. Spoke with RN and no problems infusing and no interruption.  Heparin 1250 unit IV bolus then increase rate to 1450 units/hr 8 hr heparin level Monitor for s/sx of bleeding  [facility], Pharm.D., BCPS Clinical Pharmacist Pager: [phone] 11/11/2014 9:21 PM

## 2014-11-12 ENCOUNTER — Inpatient Hospital Stay (HOSPITAL_COMMUNITY): Payer: Medicare Other

## 2014-11-12 DIAGNOSIS — Z9689 Presence of other specified functional implants: Secondary | ICD-10-CM | POA: Insufficient documentation

## 2014-11-12 DIAGNOSIS — Z789 Other specified health status: Secondary | ICD-10-CM

## 2014-11-12 LAB — GLUCOSE, CAPILLARY
Glucose-Capillary: 129 mg/dL — ABNORMAL HIGH (ref 70–99)
Glucose-Capillary: 85 mg/dL (ref 70–99)
Glucose-Capillary: 123 mg/dL — ABNORMAL HIGH (ref 70–99)
Glucose-Capillary: 150 mg/dL — ABNORMAL HIGH (ref 70–99)

## 2014-11-12 LAB — CBC
HEMATOCRIT: 31 % — AB (ref 39.0–52.0)
Hemoglobin: 9.5 g/dL — ABNORMAL LOW (ref 13.0–17.0)
MCH: 27.3 pg (ref 26.0–34.0)
MCHC: 30.6 g/dL (ref 30.0–36.0)
MCV: 89.1 fL (ref 78.0–100.0)
Platelets: 204 10*3/uL (ref 150–400)
RBC: 3.48 MIL/uL — ABNORMAL LOW (ref 4.22–5.81)
RDW: 16.5 % — ABNORMAL HIGH (ref 11.5–15.5)
WBC: 8.3 10*3/uL (ref 4.0–10.5)

## 2014-11-12 LAB — HEPARIN LEVEL (UNFRACTIONATED)
Heparin Unfractionated: 0.29 IU/mL — ABNORMAL LOW (ref 0.30–0.70)
Heparin Unfractionated: <0.1 IU/mL — ABNORMAL LOW (ref 0.30–0.70)

## 2014-11-12 MED ORDER — PENTAFLUOROPROP-TETRAFLUOROETH EX AERO: 1.0000 "application " | INHALATION_SPRAY | CUTANEOUS | Status: DC | PRN

## 2014-11-12 MED ORDER — ALTEPLASE 2 MG IJ SOLR
2.0000 mg | Freq: Once | INTRAMUSCULAR | Status: DC | PRN
  Filled 2014-11-12: qty 2

## 2014-11-12 MED ORDER — LIDOCAINE HCL (PF) 1 % IJ SOLN: 5.0000 mL | INTRAMUSCULAR | Status: DC | PRN

## 2014-11-12 MED ORDER — HEPARIN SODIUM (PORCINE) 1000 UNIT/ML DIALYSIS
1000.0000 [IU] | INTRAMUSCULAR | Status: DC | PRN
  Filled 2014-11-12: qty 1

## 2014-11-12 MED ORDER — HEPARIN BOLUS VIA INFUSION
1200.0000 [IU] | Freq: Once | INTRAVENOUS | Status: AC
  Administered 2014-11-12: 1200 [IU] via INTRAVENOUS
  Filled 2014-11-12: qty 1200

## 2014-11-12 MED ORDER — HEPARIN SODIUM (PORCINE) 1000 UNIT/ML DIALYSIS
100.0000 [IU]/kg | INTRAMUSCULAR | Status: DC | PRN
  Filled 2014-11-12: qty 9

## 2014-11-12 MED ORDER — METOPROLOL TARTRATE 25 MG PO TABS
25.0000 mg | ORAL_TABLET | Freq: Two times a day (BID) | ORAL | Status: DC
  Administered 2014-11-12: 25 mg via ORAL
  Filled 2014-11-12 (×3): qty 1

## 2014-11-12 MED ORDER — SODIUM CHLORIDE 0.9 % IV SOLN: 100.0000 mL | INTRAVENOUS | Status: DC | PRN

## 2014-11-12 MED ORDER — NEPRO/CARBSTEADY PO LIQD
237.0000 mL | ORAL | Status: DC | PRN
  Filled 2014-11-12: qty 237

## 2014-11-12 MED ORDER — DARBEPOETIN ALFA 60 MCG/0.3ML IJ SOSY
PREFILLED_SYRINGE | INTRAMUSCULAR | Status: AC
  Administered 2014-11-12: 60 ug via INTRAVENOUS
  Filled 2014-11-12: qty 0.3

## 2014-11-12 MED ORDER — HEPARIN (PORCINE) IN NACL 100-0.45 UNIT/ML-% IJ SOLN
1750.0000 [IU]/h | INTRAMUSCULAR | Status: DC
  Administered 2014-11-12 (×2): 1600 [IU]/h via INTRAVENOUS
  Filled 2014-11-12 (×3): qty 250

## 2014-11-12 MED ORDER — LIDOCAINE-PRILOCAINE 2.5-2.5 % EX CREA
1.0000 "application " | TOPICAL_CREAM | CUTANEOUS | Status: DC | PRN
  Filled 2014-11-12: qty 5

## 2014-11-12 NOTE — Clinical Documentation Improvement (Addendum)
  Please document the Probable, Suspected, or Known cause of the patient's recurrent Right Sided Pleural Effusion.  Thank You, Erling Conte ,RN Clinical Documentation Specialist:  [phone]  [facility]

## 2014-11-12 NOTE — Progress Notes (Signed)
PROGRESS NOTE  [name] [name] HDQ:[id number] DOB: April 29, 1951 DOA: 11/09/2014 PCP: [name], MD  HPI/Recap of past 7 hours: 64 year old male with past medical history of atrial fibrillation, diabetes mellitus and end-stage renal disease on hemodialysis as well as previous MRSA pleural effusion admitted on 3/14 for 3 days of shortness of breath and found to have complex fluid collection in right thorax, transferred [facility].  Patient seen by cardiothoracic surgery and right sided chest tube placed. Drained from most of 3/16.  Follow-up chest x-ray today notes persistent loculated effusion. Patient's only continued complaint today is of soreness on that side at chest tube insertion.  Assessment/Plan: Active Problems:   Atrial fibrillation: On chronic anticoagulation. Discussed with vascular surgery and started on IV heparin. Continue amiodarone and beta blocker   ESRD (end stage renal disease) on dialysis: Nephrology following. Tolerated HD today   DM type 2 causing ESRD: Blood sugars remained stable, under 150   Anemia of chronic disease: Overall stable staying above 10   Recurrent right pleural effusion: Status post chest tube. Appreciate cardiothoracic surgery help, continue chest tube    Code Status: Full code  Family Communication: Left message with family  Disposition Plan: Home once chest tube removed   Consultants:  Cardiothoracic surgery  Procedures:  Chest tube placement done 3/15  Antibiotics:  IV vancomycin 3/15-present  IV Zosyn 3/15-present   Objective: BP 90/76 mmHg  Pulse 46  Temp(Src) 98.1 F (36.7 C) (Oral)  Resp 18  Ht 6\' 4"  (1.93 m)  Wt 85.4 kg (188 lb 4.4 oz)  BMI 22.93 kg/m2  SpO2 100%  Intake/Output Summary (Last 24 hours) at 11/12/14 1708 Last data filed at 11/12/14 1500  Gross per 24 hour  Intake 800.63 ml  Output   4020 ml  Net -3219.37 ml   Filed Weights   11/09/14 0941 11/10/14 1640 11/10/14  2026  Weight: 90.719 kg (200 lb) 87.4 kg (192 lb 10.9 oz) 85.4 kg (188 lb 4.4 oz)    Exam: No change  General:  Alert and oriented 3, no acute distress  Cardiovascular: Irregular rhythm, rate controlled  Respiratory: Clear to auscultation bilaterally  Abdomen: Soft, nontender, nondistended, positive bowel sounds  Musculoskeletal: No clubbing or cyanosis or edema   Data Reviewed: Basic Metabolic Panel:  Recent Labs Lab 11/09/14 1055 11/10/14 1700 11/11/14 0437  NA 140 136 137  K 4.4 5.8* 4.5  CL 100 97 98  CO2 28 21 27   GLUCOSE 92 141* 99  BUN 30* 42* 18  CREATININE 6.85* 8.35* 5.16*  CALCIUM 7.8* 7.7* 7.8*  PHOS  --  5.3*  --    Liver Function Tests:  Recent Labs Lab 11/10/14 1700  ALBUMIN 2.4*   No results for input(s): LIPASE, AMYLASE in the last 168 hours. No results for input(s): AMMONIA in the last 168 hours. CBC:  Recent Labs Lab 11/09/14 1055 11/10/14 1700 11/11/14 0437 11/12/14 0602  WBC 7.9 9.2 8.8 8.3  NEUTROABS 6.1  --   --   --   HGB 10.0* 10.2* 10.6* 9.5*  HCT 33.0* 33.0* 34.6* 31.0*  MCV 94.0 90.2 90.3 89.1  PLT 238 295 243 204   Cardiac Enzymes:    Recent Labs Lab 11/09/14 1055  TROPONINI <0.03   BNP (last 3 results) No results for input(s): BNP in the last 8760 hours.  ProBNP (last 3 results)  Recent Labs  08/15/14 0245  PROBNP >70000.0*    CBG:  Recent Labs  Lab 11/11/14 1134 11/11/14 1606 11/11/14 2146 11/12/14 0617 11/12/14 1346  GLUCAP 154* 147* 91 129* 85    Recent Results (from the past 240 hour(s))  MRSA PCR Screening     Status: Abnormal   Collection Time: 11/09/14  7:07 PM  Result Value Ref Range Status   MRSA by PCR POSITIVE (A) NEGATIVE Final    Comment:        The GeneXpert MRSA Assay (FDA approved for NASAL specimens only), is one component of a comprehensive MRSA colonization surveillance program. It is not intended to diagnose MRSA infection nor to guide or monitor treatment for MRSA  infections. RESULT CALLED TO, READ BACK BY AND VERIFIED WITH: SPRADLING,S RN 2243 11/09/14 MITCHELL,L   Culture, routine-abscess     Status: None (Preliminary result)   Collection Time: 11/10/14  2:38 PM  Result Value Ref Range Status   Specimen Description ABSCESS RIGHT PLEURAL  Final   Special Requests NONE  Final   Gram Stain   Final    ABUNDANT WBC PRESENT,BOTH PMN AND MONONUCLEAR NO SQUAMOUS EPITHELIAL CELLS SEEN NO ORGANISMS SEEN Performed at [facility]    Culture   Final    NO GROWTH 1 DAY Performed at [facility]    Report Status PENDING  Incomplete     Studies: Dg Chest Port 1 View  11/11/2014   CLINICAL DATA:  64 year old male with chest pain, pleural effusion. Initial encounter.  EXAM: PORTABLE CHEST - 1 VIEW  COMPARISON:  CT-guided chest tube placement images 31516 and earlier.  FINDINGS: Portable AP semi upright view at 1104 hours. Percutaneous pigtail right pleural drain remains in place with regressed thick walled pleural collection. Lower lung volumes compared to 11/09/2014. Stable cardiac size and mediastinal contours. No new cardiopulmonary abnormality identified.  IMPRESSION: Regressed right lung base pleural collection following percutaneous drainage catheter placement. No new cardiopulmonary abnormality identified.   Electronically Signed   By: Genevie Ann M.D.   On: 11/11/2014 11:54    Scheduled Meds: . allopurinol  100 mg Oral Daily  . amiodarone  200 mg Oral Daily  . Chlorhexidine Gluconate Cloth  6 each Topical Q0600  . cinacalcet  30 mg Oral Q breakfast  . Darbepoetin Alfa  60 mcg Intravenous Q Thu-HD  . ferric gluconate (FERRLECIT/NULECIT) IV  62.5 mg Intravenous Q Thu-1800  . FLUoxetine  20 mg Oral Daily  . insulin aspart  0-5 Units Subcutaneous QHS  . insulin aspart  0-9 Units Subcutaneous TID WC  . insulin glargine  10 Units Subcutaneous QHS  . metoprolol  25 mg Oral BID  . midodrine  10 mg Oral BID WC  . multivitamin  1 tablet Oral  QHS  . mupirocin ointment  1 application Nasal BID  . pantoprazole  40 mg Oral BID  . piperacillin-tazobactam (ZOSYN)  IV  2.25 g Intravenous Q8H  . sevelamer carbonate  800 mg Oral TID WC  . sodium chloride  3 mL Intravenous Q12H  . sodium chloride  3 mL Intravenous Q12H  . traZODone  50 mg Oral QHS  . vancomycin  1,000 mg Intravenous Q T,Th,Sa-HD    Continuous Infusions: . heparin 1,600 Units/hr (11/12/14 0854)     Time spent: 15 min  [facility] Pager [phone]. If 7PM-7AM, please contact night-coverage at [email or web address], password [facility] 11/12/2014, 5:08 PM  LOS: 3 days

## 2014-11-12 NOTE — Procedures (Signed)
I was present at this session.  I have reviewed the session itself and made appropriate changes.  BP low to start. tol well. Will lower goal.   Cut meds  Whitten Andreoni,Bertrum L 3/17/20169:25 AM

## 2014-11-12 NOTE — Progress Notes (Signed)
Chief Complaint: Chief Complaint  Patient presents with  . Shortness of Breath  R loculated pleural effusion  Referring Physician(s): Dr Servando Snare  History of Present Illness: [name] [name] is a 64 y.o. male   Pt some better today Just back from dialysis Chest tube drain intact Good output CXR this am---sl improved  Past Medical History  Diagnosis Date  . UGI bleed 02/12/11    On anticoagulation; EGD w/snare polypectomy-multiple polypoid lesions antrum(benign), Chronic active gastritis (NEGATIVE H pylori)  . Anemia, chronic disease   . Sepsis due to enterococcus 02/11/11  . Atrial fibrillation     On coumadin  . Coronary atherosclerosis of native coronary artery     Multivessel status post CABG  . Cardiomyopathy   . Type 2 diabetes mellitus   . Essential hypertension, benign   . Hyperlipemia   . Gout   . S/P patent foramen ovale closure   . Hyperbilirubinemia 08/10/2013  . SBO (small bowel obstruction) 01/2014  . 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 Clostridium difficile colitis 01/2014  . Diverticulosis   . Internal hemorrhoids   . ESRD on hemodialysis     Started 2008-2009. Dr Hinda Lenis on a TTS schedule - R forearm AVF for several years      Past Surgical History  Procedure Laterality Date  . Mitral valve replacement  01/05/2007    Bioprosthetic 75mm Edwards precordial tissue (Dr. Servando Snare)  . Coronary artery bypass graft  01/05/2007    LIMA-LAD, SVG-OM, seq. SVG-PDA, PLA-RCA  . Colonoscopy  06/23/2011    Dr. Oneida Alar: multiple sessile and pedunculated polyps, moderate diverticulosis, internal hemorrhoids, +ADENOMATOUS POLYPS, consider genetic testing, surveillance in October 2015   . Esophagogastroduodenoscopy  02/12/2011    CHRONIC ACTIVE GASTRITIS, NO H. pylori  . Transthoracic echocardiogram  11/2011    EF 25% w/ mod dilatation of LV & mod LVH; LA severely dilated; mod-severe dilation of RV with mod-severe decrease in RV function; mild MR & TR  . Cardioversion   04/19/2007    Dr. Marella Chimes  . Transthoracic echocardiogram  02/14/2011    EF 40-45%, mod conc LVH; ventricular septum with motion showing abnormal function, dyssynergy, paradox; mild AS; mild-mod MR stenosis; LA severely dilated; aptrial septum bowed from L to R with increased LA pressure   . Amputation Right 07/14/2013    Procedure: AMPUTATION DIGIT;  Surgeon: [name], MD;  Location: [facility];  Service: General;  Laterality: Right;  . Amputation Right 08/08/2013    Procedure: AMPUTATION BELOW KNEE;  Surgeon: [name], MD;  Location: [facility];  Service: General;  Laterality: Right;  . Stump revision Right 09/19/2013    Procedure: STUMP REVISION;  Surgeon: Scherry Ran, MD;  Location: [facility];  Service: General;  Laterality: Right;  . Amputation Right 10/17/2013    Procedure: AMPUTATION ABOVE KNEE;  Surgeon: [name], MD;  Location: [facility];  Service: General;  Laterality: Right;  . Colonoscopy N/A 04/10/2014    Procedure: COLONOSCOPY;  Surgeon: [name], MD;  Location: [facility];  Service: Endoscopy;  Laterality: N/A;  830  . Tee without cardioversion N/A 08/19/2014    Procedure: TRANSESOPHAGEAL ECHOCARDIOGRAM (TEE);  Surgeon: [name], MD;  Location: [facility] ENDOSCOPY;  Service: Cardiovascular;  Laterality: N/A;  . Cardioversion N/A 08/19/2014    Procedure: CARDIOVERSION;  Surgeon: [name], MD;  Location: [facility] ENDOSCOPY;  Service: Cardiovascular;  Laterality: N/A;    Allergies: Bacitracin and Norvasc  Medications: Prior to Admission medications   Medication Sig Start Date End Date Taking? Authorizing Provider  albuterol (PROVENTIL) (2.5 MG/3ML) 0.083% nebulizer solution Take 3 mLs (2.5 mg total) by nebulization every 2 (two) hours as needed for wheezing. 02/21/14  Yes Kinnie Feil, MD  allopurinol (ZYLOPRIM) 100 MG tablet Take 100 mg by mouth daily.  05/15/11  Yes Historical Provider, MD  amiodarone (PACERONE) 200 MG tablet Take 1 tablet (200 mg  total) by mouth daily. 08/31/14  Yes Satira Sark, MD  B Complex-C-Zn-Folic Acid (DIALYVITE/ZINC PO) Take by mouth 3 (three) times a week. Patient has dialysis on Tuesdays, Thursdays, and Saturdays   Yes Historical Provider, MD  FLUoxetine (PROZAC) 20 MG capsule Take 20 mg by mouth daily. 09/21/14  Yes Historical Provider, MD  folic acid (FOLVITE) 1 MG tablet Take 1 mg by mouth daily.     Yes Historical Provider, MD  insulin glargine (LANTUS) 100 UNIT/ML injection Inject 10 Units into the skin at bedtime.   Yes Historical Provider, MD  metoprolol (LOPRESSOR) 50 MG tablet Take 75 mg by mouth 2 (two) times daily. 07/04/14  Yes Historical Provider, MD  midodrine (PROAMATINE) 10 MG tablet Take 1 tablet (10 mg total) by mouth 2 (two) times daily with a meal. 08/20/14  Yes Silver Huguenin Elgergawy, MD  pantoprazole (PROTONIX) 40 MG tablet Take 40 mg by mouth 2 (two) times daily.  05/25/11  Yes Historical Provider, MD  RENVELA 800 MG tablet Take 800 mg by mouth 3 (three) times daily with meals.  05/03/11  Yes Historical Provider, MD  SENSIPAR 30 MG tablet Take 30 mg by mouth daily. 01/22/14  Yes Historical Provider, MD  traMADol (ULTRAM) 50 MG tablet Take by mouth every 8 (eight) hours as needed for moderate pain.   Yes Historical Provider, MD  traZODone (DESYREL) 50 MG tablet Take 1 tablet (50 mg total) by mouth at bedtime. 08/13/13  Yes Nita Sells, MD  warfarin (COUMADIN) 1 MG tablet Take 1/2 tablet daily Patient taking differently: Take 1/2 tablet qhs 09/08/14  Yes Satira Sark, MD  HYDROcodone-acetaminophen (NORCO/VICODIN) 5-325 MG per tablet Take 1 tablet by mouth every 6 (six) hours as needed for moderate pain. 08/06/14   Carole Civil, MD     Family History  Problem Relation Age of Onset  . Diabetes Mother   . Diabetes Father   . Diabetes Sister   . Colon cancer Neg Hx   . Colon polyps Neg Hx     History   Social History  . Marital Status: Married    Spouse Name: N/A  . Number  of Children: 1  . Years of Education: N/A   Occupational History  . Retired     [employer]  .     Social History Main Topics  . Smoking status: Former Smoker -- 1.00 packs/day for 8 years    Types: Cigarettes    Start date: 04/27/1969    Quit date: 11/27/2006  . Smokeless tobacco: Never Used     Comment: quit about 5 yrs  . Alcohol Use: No  . Drug Use: No  . Sexual Activity: Not on file   Other Topics Concern  . None   Social History Narrative   ONE CHILD AGE 89-MALE. LIVES IN [place].      Review of Systems: A 12 point ROS discussed and pertinent positives are indicated in the HPI above.  All other systems are negative.  Review of Systems  Vital Signs: BP 89/57 mmHg  Pulse 46  Temp(Src) 98.1 F (36.7 C) (Oral)  Resp 18  Ht 6\' 4"  (1.93 m)  Wt 85.4 kg (188 lb 4.4 oz)  BMI 22.93 kg/m2  SpO2 100%  Physical Exam  Pulmonary/Chest: Effort normal. He has no wheezes.  Skin:  Site of chest tube clean and dry NT No bleeding No air leak in pleur vac chamber 800 cc brown fluid in pleur vac  Nursing note and vitals reviewed.   Mallampati Score:  MD Evaluation Airway: WNL Heart: WNL Abdomen: WNL Chest/ Lungs: WNL ASA  Classification: 3 Mallampati/Airway Score: One  Imaging: Dg Chest 2 View  11/12/2014   CLINICAL DATA:  Chest tube, essential benign hypertension, type 2 diabetes, coronary artery disease post CABG, atrial fibrillation, end-stage renal disease  EXAM: CHEST  2 VIEW  COMPARISON:  Portable exam of 11/11/2014, chest CT 11/09/2014  FINDINGS: RIGHT basilar pigtail thoracostomy tube unchanged.  Enlargement of cardiac silhouette post MVR and CABG.  Pulmonary vascular congestion.  Persistent atelectasis of RIGHT upper and RIGHT lower lobes.  Persistent loculated RIGHT pleural effusion.  Probable mild pulmonary edema.  No pneumothorax.  Bones demineralized.  IMPRESSION: Enlargement of cardiac silhouette with pulmonary vascular congestion and  probable while pulmonary edema.  Persistent loculated RIGHT pleural effusion and RIGHT lung atelectasis.   Electronically Signed   By: Lavonia Dana M.D.   On: 11/12/2014 08:10   Dg Chest 2 View  11/09/2014   CLINICAL DATA:  Shortness of breath  EXAM: CHEST  2 VIEW  COMPARISON:  11/09/2014  FINDINGS: Unchanged cardiomegaly. Aortic and hilar contours or stable from prior. Patient is status post CABG and mitral valve replacement.  No interstitial coarsening with improved lung volumes. There is a moderate right pleural effusion with posterior loculation. The appearance is stable from two-view chest x-ray 08/13/2014, a peripherally enhancing loculated effusion based on abdominal CT 08/13/2014. Prominent right hilar opacity persists without evidence of hilar adenopathy or mass in the lateral projection. This appearance is likely from the pleural fluid.  Remote L1 compression fracture with advanced height loss. No acute osseous findings.  IMPRESSION: 1. Chronic, loculated right pleural effusion with atelectasis. 2. No evidence for pulmonary edema on this better inflated study.   Electronically Signed   By: Monte Fantasia M.D.   On: 11/09/2014 10:52   Ct Chest W Contrast  11/09/2014   CLINICAL DATA:  Shortness of breath.  Productive cough.  EXAM: CT CHEST WITH CONTRAST  TECHNIQUE: Multidetector CT imaging of the chest was performed during intravenous contrast administration.  CONTRAST:  75mL OMNIPAQUE IOHEXOL 300 MG/ML  SOLN  COMPARISON:  12/06/2011  FINDINGS: THORACIC INLET/BODY WALL:  No acute abnormality.  MEDIASTINUM:  Stable cardiomegaly, with especially prominent left atrial enlargement. The patient is status post mitral valve replacement and CABG. Portions of the LIMA and venous grafts which are large enough to evaluate are enhancing. No aortic aneurysm or dissection. No evidence of pulmonary embolism. Adenopathy stable to decreased from 2013, with right lower peritracheal and subcarinal nodes remaining the  most prominent. A 15 mm metallic body (with the appearance of stent) within a right upper lobe pulmonary artery is stable from previous. There is no associated vessel occlusion.  LUNG WINDOWS:  Chronic loculated pleural effusion in the posterior and lower right chest with thick enhancing rim. The fluid collection is large, measuring 18 cm in craniocaudal span by 10 cm in thickness. The appearance is stable compared to visible portions on abdominal CT  08/13/2014. Based on abdominal CT favor remote hemothorax. Opacification along the chronic pleural effusion has heterogeneous enhancement, swirling interstitium, and subpleural rounded morphology. This is predominantly within the right lower lobe, although also present along the posterior right upper lobe and right middle lobe. There is small, loculated pleural effusion in the anterior and upper right hemi thorax without pleural nodularity or enhancement in this location. Trace layering left pleural effusion. Stable left lower lobe pulmonary nodule at 6 mm. Given stability from 2013 this is considered benign. Mild apical emphysema and air trapping.  UPPER ABDOMEN:  Hepatic fissure enlargement and subtle liver surface nodularity could reflect early cirrhosis. Cholelithiasis without evidence of acute cholecystitis.  OSSEOUS:  Remote L1 compression fracture with greater than 75% height loss. Remote left posterior rib fractures.  IMPRESSION: 1. Chronic large and complex right pleural effusion, remote hemothorax based on abdominal CT 02/27/2014. 2. Collapsed and distorted right lower lobe secondary to #1, likely with developing rounded atelectasis. Cannot exclude superimposed infection. 3. Indeterminate, small loculated pleural effusion in the right upper chest. 4. Multiple incidental findings are stable from 2013 and noted above.   Electronically Signed   By: Monte Fantasia M.D.   On: 11/09/2014 12:47   Dg Chest Port 1 View  11/11/2014   CLINICAL DATA:  64 year old male  with chest pain, pleural effusion. Initial encounter.  EXAM: PORTABLE CHEST - 1 VIEW  COMPARISON:  CT-guided chest tube placement images 31516 and earlier.  FINDINGS: Portable AP semi upright view at 1104 hours. Percutaneous pigtail right pleural drain remains in place with regressed thick walled pleural collection. Lower lung volumes compared to 11/09/2014. Stable cardiac size and mediastinal contours. No new cardiopulmonary abnormality identified.  IMPRESSION: Regressed right lung base pleural collection following percutaneous drainage catheter placement. No new cardiopulmonary abnormality identified.   Electronically Signed   By: Genevie Ann M.D.   On: 11/11/2014 11:54   Dg Chest Portable 1 View  11/09/2014   CLINICAL DATA:  Shortness of breath for 1 week.  Dialysis.  EXAM: PORTABLE CHEST - 1 VIEW  COMPARISON:  08/16/2014  FINDINGS: There is chronic cardiomegaly post CABG and mitral valve replacement. Aortic contours are stable from prior. Pulmonary venous congestion and mild pulmonary edema with a large right pleural effusion. New circumscribed opacity in the right hilum, likely fissural fluid.  IMPRESSION: 1. Pulmonary edema with chronic moderate right pleural effusion. 2. A right hilar opacity is likely fissural fluid. When clinically able recommend two-view chest x-ray.   Electronically Signed   By: Monte Fantasia M.D.   On: 11/09/2014 10:01   Ct Perc Pleural Drain W/indwell Cath W/img Guide  11/10/2014   INDICATION: Recurrent symptomatic right-sided pleural effusion - as patient has multiple medical comorbidities and is a poor operative candidate, request made by consulting thoracic surgeon, Dr. Servando Snare, for placement of a CT-guided large bore catheter.  EXAM: CT PERC PLEURAL DRAIN W/INDWELL CATH W/IMG GUIDE  COMPARISON:  Chest CT - 11/09/2014; ultrasound-guided right-sided thoracentesis - 08/16/2014  MEDICATIONS: The patient is currently admitted to the hospital and receiving intravenous antibiotics.  The antibiotics were administered within an appropriate time frame prior to the initiation of the procedure.  ANESTHESIA/SEDATION: Fentanyl 50 mcg IV; Versed 1 mg IV  Total Moderate Sedation time  22 minutes  CONTRAST:  None  COMPLICATIONS: None immediate  PROCEDURE: Informed written consent was obtained from the patient after a discussion of the risks, benefits and alternatives to treatment. The patient was placed supine, slightly LPO on the  CT gantry and a pre procedural CT was performed re-demonstrating the known complex right-sided pleural effusion. The procedure was planned. A timeout was performed prior to the initiation of the procedure.  The right lateral chest was prepped and draped in the usual sterile fashion. The overlying soft tissues were anesthetized with 1% lidocaine with epinephrine. Appropriate trajectory was planned with the use of a 22 gauge spinal needle. An 18 gauge trocar needle was advanced into the abscess/fluid collection and a short Amplatz super stiff wire was coiled within the collection. Appropriate positioning was confirmed with a limited CT scan. The tract was serially dilated allowing placement of a 14 French all-purpose drainage catheter. Appropriate positioning was confirmed with a limited postprocedural CT scan.  Approximately 1 L foul smelling brown fluid was aspirated from the right pleural space. Following removal for approximately 1 L of purulent material, limited CT scanning was performed demonstrating development of a small loculated ex vacuo pneumothorax within the right basilar pleural space. The tube was connected to a pleural vac and sutured in place. A dressing was placed. The patient tolerated the procedure well without immediate post procedural complication.  IMPRESSION: Successful CT guided placement of a 16 French all purpose drain catheter into the right pleural space with aspiration of 1 L of purulent, brown-colored fluid. Samples were sent to the laboratory as  requested by the ordering clinical team.   Electronically Signed   By: Sandi Mariscal M.D.   On: 11/10/2014 16:23    Labs:  CBC:  Recent Labs  11/09/14 1055 11/10/14 1700 11/11/14 0437 11/12/14 0602  WBC 7.9 9.2 8.8 8.3  HGB 10.0* 10.2* 10.6* 9.5*  HCT 33.0* 33.0* 34.6* 31.0*  PLT 238 295 243 204    COAGS:  Recent Labs  10/08/14 1240 10/08/14 1243 10/19/14 1712 11/09/14 1055  INR 1.8 1.8 1.8 1.31    BMP:  Recent Labs  09/29/14 1257 11/09/14 1055 11/10/14 1700 11/11/14 0437  NA 138 140 136 137  K 3.8 4.4 5.8* 4.5  CL 99 100 97 98  CO2 28 28 21 27   GLUCOSE 224* 92 141* 99  BUN 18 30* 42* 18  CALCIUM 7.9* 7.8* 7.7* 7.8*  CREATININE 3.92* 6.85* 8.35* 5.16*  GFRNONAA 15* 8* 6* 11*  GFRAA 17* 9* 7* 12*    LIVER FUNCTION TESTS:  Recent Labs  02/27/14 2022  08/13/14 0403 08/15/14 0245 08/16/14 1616 08/17/14 0320 08/17/14 1022 09/29/14 1257 11/10/14 1700  BILITOT 1.2  --  1.5* 2.0*  --   --   --  1.7*  --   AST 19  --  34 34  --   --   --  29  --   ALT 10  --  20 24  --   --   --  14  --   ALKPHOS 92  --  168* 151*  --   --   --  127*  --   PROT 7.0  --  7.1 6.5 6.6  --   --  8.2  --   ALBUMIN 2.1*  < > 2.5* 2.1*  --  1.9* 2.0* 2.3* 2.4*  < > = values in this interval not displayed.  TUMOR MARKERS: No results for input(s): AFPTM, CEA, CA199, CHROMGRNA in the last 8760 hours.  Assessment and Plan:  R chest tube drain intact Better daily Will follow   Thank you for this interesting consult.  I greatly enjoyed meeting TRISTEN LUCE and look forward to participating  in their care.  Signed: Shany Marinez A 11/12/2014, 2:13 PM   I spent a total of 15 minutes in face to face in clinical consultation, greater than 50% of which was counseling/coordinating care for Rt chest tube drain

## 2014-11-12 NOTE — Progress Notes (Signed)
Subjective: Interval History: has no complaint, in gen feels better after drainage..  Objective: Vital signs in last 24 hours: Temp:  [98.1 F (36.7 C)-98.9 F (37.2 C)] 98.1 F (36.7 C) (03/17 0458) Pulse Rate:  [40-89] 89 (03/17 0857) Resp:  [18-24] 24 (03/17 0857) BP: (98-115)/(41-102) 98/63 mmHg (03/17 0857) SpO2:  [97 %-100 %] 98 % (03/17 0458) Weight change:   Intake/Output from previous day: 03/16 0701 - 03/17 0700 In: 920.6 [P.O.:850; I.V.:70.6] Out: 550 [Chest Tube:550] Intake/Output this shift: Total I/O In: 120 [P.O.:120] Out: 200 [Chest Tube:200]  General appearance: alert, cooperative and no distress Resp: rales RLL and rhonchi RLL Chest wall: R CT Cardio: S1, S2 normal and systolic murmur: holosystolic 2/6, blowing at apex GI: soft, non-tender; bowel sounds normal; no masses,  no organomegaly Extremities: AKA, R AVF  Lab Results:  Recent Labs  11/11/14 0437 11/12/14 0602  WBC 8.8 8.3  HGB 10.6* 9.5*  HCT 34.6* 31.0*  PLT 243 204   BMET:  Recent Labs  11/10/14 1700 11/11/14 0437  NA 136 137  K 5.8* 4.5  CL 97 98  CO2 21 27  GLUCOSE 141* 99  BUN 42* 18  CREATININE 8.35* 5.16*  CALCIUM 7.7* 7.8*   No results for input(s): PTH in the last 72 hours. Iron Studies: No results for input(s): IRON, TIBC, TRANSFERRIN, FERRITIN in the last 72 hours.  Studies/Results: Dg Chest 2 View  11/12/2014   CLINICAL DATA:  Chest tube, essential benign hypertension, type 2 diabetes, coronary artery disease post CABG, atrial fibrillation, end-stage renal disease  EXAM: CHEST  2 VIEW  COMPARISON:  Portable exam of 11/11/2014, chest CT 11/09/2014  FINDINGS: RIGHT basilar pigtail thoracostomy tube unchanged.  Enlargement of cardiac silhouette post MVR and CABG.  Pulmonary vascular congestion.  Persistent atelectasis of RIGHT upper and RIGHT lower lobes.  Persistent loculated RIGHT pleural effusion.  Probable mild pulmonary edema.  No pneumothorax.  Bones demineralized.   IMPRESSION: Enlargement of cardiac silhouette with pulmonary vascular congestion and probable while pulmonary edema.  Persistent loculated RIGHT pleural effusion and RIGHT lung atelectasis.   Electronically Signed   By: Lavonia Dana M.D.   On: 11/12/2014 08:10   Dg Chest Port 1 View  11/11/2014   CLINICAL DATA:  64 year old male with chest pain, pleural effusion. Initial encounter.  EXAM: PORTABLE CHEST - 1 VIEW  COMPARISON:  CT-guided chest tube placement images 31516 and earlier.  FINDINGS: Portable AP semi upright view at 1104 hours. Percutaneous pigtail right pleural drain remains in place with regressed thick walled pleural collection. Lower lung volumes compared to 11/09/2014. Stable cardiac size and mediastinal contours. No new cardiopulmonary abnormality identified.  IMPRESSION: Regressed right lung base pleural collection following percutaneous drainage catheter placement. No new cardiopulmonary abnormality identified.   Electronically Signed   By: Genevie Ann M.D.   On: 11/11/2014 11:54   Ct Perc Pleural Drain W/indwell Cath W/img Guide  11/10/2014   INDICATION: Recurrent symptomatic right-sided pleural effusion - as patient has multiple medical comorbidities and is a poor operative candidate, request made by consulting thoracic surgeon, Dr. Servando Snare, for placement of a CT-guided large bore catheter.  EXAM: CT PERC PLEURAL DRAIN W/INDWELL CATH W/IMG GUIDE  COMPARISON:  Chest CT - 11/09/2014; ultrasound-guided right-sided thoracentesis - 08/16/2014  MEDICATIONS: The patient is currently admitted to the hospital and receiving intravenous antibiotics. The antibiotics were administered within an appropriate time frame prior to the initiation of the procedure.  ANESTHESIA/SEDATION: Fentanyl 50 mcg IV; Versed 1  mg IV  Total Moderate Sedation time  22 minutes  CONTRAST:  None  COMPLICATIONS: None immediate  PROCEDURE: Informed written consent was obtained from the patient after a discussion of the risks,  benefits and alternatives to treatment. The patient was placed supine, slightly LPO on the CT gantry and a pre procedural CT was performed re-demonstrating the known complex right-sided pleural effusion. The procedure was planned. A timeout was performed prior to the initiation of the procedure.  The right lateral chest was prepped and draped in the usual sterile fashion. The overlying soft tissues were anesthetized with 1% lidocaine with epinephrine. Appropriate trajectory was planned with the use of a 22 gauge spinal needle. An 18 gauge trocar needle was advanced into the abscess/fluid collection and a short Amplatz super stiff wire was coiled within the collection. Appropriate positioning was confirmed with a limited CT scan. The tract was serially dilated allowing placement of a 14 French all-purpose drainage catheter. Appropriate positioning was confirmed with a limited postprocedural CT scan.  Approximately 1 L foul smelling brown fluid was aspirated from the right pleural space. Following removal for approximately 1 L of purulent material, limited CT scanning was performed demonstrating development of a small loculated ex vacuo pneumothorax within the right basilar pleural space. The tube was connected to a pleural vac and sutured in place. A dressing was placed. The patient tolerated the procedure well without immediate post procedural complication.  IMPRESSION: Successful CT guided placement of a 88 French all purpose drain catheter into the right pleural space with aspiration of 1 L of purulent, brown-colored fluid. Samples were sent to the laboratory as requested by the ordering clinical team.   Electronically Signed   By: Sandi Mariscal M.D.   On: 11/10/2014 16:23    I have reviewed the patient's current medications.  Assessment/Plan: 1  ESRD  For hd, bp low, cut metop.  Vol xs mild 2 DM controlled 3 PVD 4 Anemia stable 5 HPTH 6 Empyema per Throracic ? Need for Zosyn 7 Afib rate controlled and  anticoag P HD, epo, ab,     LOS: 3 days   Corryn Madewell,Chue L 11/12/2014,9:27 AM

## 2014-11-12 NOTE — Progress Notes (Signed)
ANTICOAGULATION CONSULT NOTE - Follow Up Consult  Pharmacy Consult for heparin Indication: atrial fibrillation  Allergies  Allergen Reactions  . Bacitracin Other (See Comments)    unknown  . Norvasc [Amlodipine Besylate] Other (See Comments)    unknown    Patient Measurements: Height: 6\' 4"  (193 cm) Weight: 188 lb 4.4 oz (85.4 kg) IBW/kg (Calculated) : 86.8 Heparin Dosing Weight: 85 kg  Vital Signs: Temp: 98.1 F (36.7 C) (03/17 1300) Temp Source: Oral (03/17 1300) BP: 90/76 mmHg (03/17 1500) Pulse Rate: 46 (03/17 1300)  Labs:  Recent Labs  11/10/14 1700 11/11/14 0437 11/11/14 2019 11/12/14 0602 11/12/14 1520  HGB 10.2* 10.6*  --  9.5*  --   HCT 33.0* 34.6*  --  31.0*  --   PLT 295 243  --  204  --   HEPARINUNFRC  --   --  0.20* 0.29* <0.10*  CREATININE 8.35* 5.16*  --   --   --     Estimated Creatinine Clearance: 17.2 mL/min (by C-G formula based on Cr of 5.16).   Medications:  Infusions:  . heparin 1,600 Units/hr (11/12/14 9030)    Assessment: 64 y/o male who presented on 3/14 with a 3 day history of increasing shortness of breath with productive cough. He was on chronic Coumadin PTA for Afib. Coumadin was held for chest tube placement 3/16. He continues on IV heparin.  Heparin level is subtherapeutic at <0.1. Spoke with floor RN who reports when he came back from HD, the IV site where the heparin is infusing was not working properly and unknown for how [place] it was not working. She fixed the issue and heparin has been infusing properly since ~16:00. No bleeding noted.   Goal of Therapy:  Heparin level 0.3-0.7 units/ml Monitor platelets by anticoagulation protocol: Yes   Plan:  - Continue heparin drip at 1600 units/hr - Recheck 8 hr heparin level - Daily heparin level and CBC - Monitor for s/sx of bleeding  [facility], Ono.D., BCPS Clinical Pharmacist Pager: [phone] 11/12/2014 5:11 PM

## 2014-11-12 NOTE — Progress Notes (Signed)
ANTICOAGULATION CONSULT NOTE - Follow-up  Pharmacy Consult for Heparin  Indication: atrial fibrillation  Allergies  Allergen Reactions  . Bacitracin Other (See Comments)    unknown  . Norvasc [Amlodipine Besylate] Other (See Comments)    unknown    Patient Measurements: Height: 6\' 4"  (193 cm) Weight: 188 lb 4.4 oz (85.4 kg) IBW/kg (Calculated) : 86.8  Vital Signs: Temp: 98.1 F (36.7 C) (03/17 0458) Temp Source: Oral (03/17 0458) BP: 109/43 mmHg (03/17 0458) Pulse Rate: 81 (03/17 0458)  Labs:  Recent Labs  11/09/14 1055 11/10/14 1700 11/11/14 0437 11/11/14 2019 11/12/14 0602  HGB 10.0* 10.2* 10.6*  --  9.5*  HCT 33.0* 33.0* 34.6*  --  31.0*  PLT 238 295 243  --  204  LABPROT 16.4*  --   --   --   --   INR 1.31  --   --   --   --   HEPARINUNFRC  --   --   --  0.20* 0.29*  CREATININE 6.85* 8.35* 5.16*  --   --   TROPONINI <0.03  --   --   --   --     Estimated Creatinine Clearance: 17.2 mL/min (by C-G formula based on Cr of 5.16).   Medical History: Past Medical History  Diagnosis Date  . UGI bleed 02/12/11    On anticoagulation; EGD w/snare polypectomy-multiple polypoid lesions antrum(benign), Chronic active gastritis (NEGATIVE H pylori)  . Anemia, chronic disease   . Sepsis due to enterococcus 02/11/11  . Atrial fibrillation     On coumadin  . Coronary atherosclerosis of native coronary artery     Multivessel status post CABG  . Cardiomyopathy   . Type 2 diabetes mellitus   . Essential hypertension, benign   . Hyperlipemia   . Gout   . S/P patent foramen ovale closure   . Hyperbilirubinemia 08/10/2013  . SBO (small bowel obstruction) 01/2014  . 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 Clostridium difficile colitis 01/2014  . Diverticulosis   . Internal hemorrhoids   . ESRD on hemodialysis     Started 2008-2009. Dr Hinda Lenis on a TTS schedule - R forearm AVF for several years      Medications:  Prescriptions prior to admission  Medication Sig Dispense  Refill Last Dose  . albuterol (PROVENTIL) (2.5 MG/3ML) 0.083% nebulizer solution Take 3 mLs (2.5 mg total) by nebulization every 2 (two) hours as needed for wheezing. 75 mL 12 11/06/2014  . allopurinol (ZYLOPRIM) 100 MG tablet Take 100 mg by mouth daily.    11/08/2014 at Unknown time  . amiodarone (PACERONE) 200 MG tablet Take 1 tablet (200 mg total) by mouth daily.   2-3 days  . B Complex-C-Zn-Folic Acid (DIALYVITE/ZINC PO) Take by mouth 3 (three) times a week. Patient has dialysis on Tuesdays, Thursdays, and Saturdays   11/07/2014  . FLUoxetine (PROZAC) 20 MG capsule Take 20 mg by mouth daily.   11/08/2014 at Unknown time  . folic acid (FOLVITE) 1 MG tablet Take 1 mg by mouth daily.     11/08/2014 at Unknown time  . insulin glargine (LANTUS) 100 UNIT/ML injection Inject 10 Units into the skin at bedtime.   11/08/2014 at Unknown time  . metoprolol (LOPRESSOR) 50 MG tablet Take 75 mg by mouth 2 (two) times daily.   11/08/2014 at 830 pm  . midodrine (PROAMATINE) 10 MG tablet Take 1 tablet (10 mg total) by mouth 2 (two) times daily with a meal.  11/08/2014 at Unknown time  . pantoprazole (PROTONIX) 40 MG tablet Take 40 mg by mouth 2 (two) times daily.    11/08/2014 at Unknown time  . RENVELA 800 MG tablet Take 800 mg by mouth 3 (three) times daily with meals.    11/08/2014 at Unknown time  . SENSIPAR 30 MG tablet Take 30 mg by mouth daily.   11/08/2014 at Unknown time  . traMADol (ULTRAM) 50 MG tablet Take by mouth every 8 (eight) hours as needed for moderate pain.   unknown  . traZODone (DESYREL) 50 MG tablet Take 1 tablet (50 mg total) by mouth at bedtime. 30 tablet 0 11/08/2014 at Unknown time  . warfarin (COUMADIN) 1 MG tablet Take 1/2 tablet daily (Patient taking differently: Take 1/2 tablet qhs) 30 tablet 3 11/08/2014 at Unknown time  . HYDROcodone-acetaminophen (NORCO/VICODIN) 5-325 MG per tablet Take 1 tablet by mouth every 6 (six) hours as needed for moderate pain. 100 tablet 0     Assessment: [name] [name]  who presented on 11/09/14 with a 3 day history of increasing shortness of breath with productive cough. He was on chronic Coumadin PTA for Afib. Coumadin was held for chest tube placement 11/11/14. Pharmacy has been consulted to dose IV heparin. Patient received a SQ dose of heparin at ~ 0530 on 11/11/14. On 11/11/14 patient also received a 2000 unit bolus at 1130 followed by a drip rate of 1250 units/hr, then a 1250 unit bolus followed by an increase in the drip rate to 1450 units/hr since the heparin level was SUBtherapeutic at 0.2 units/mL.   Today's heparin level was also SUBtherapeutic at 0.29 units/mL (slightly below goal of 0.3-0.7), will bolus and increase rate to ensure therapeutic range. H/H remains low, but stable and PLTs are WNL. No signs or symptoms of bleeding noted.   Goal of Therapy:  Heparin level 0.3-0.7 units/ml Monitor platelets by anticoagulation protocol: Yes   Plan:  -Give a 1200 unit bolus, followed by increasing the drip rate to 1600 units/hr -Follow up 8 hr heparin level -Monitor daily CBC, HL and s/s of bleeding -Folllow up transition to oral anticoagulation    Gloriajean Dell, PharmD Candidate

## 2014-11-12 NOTE — Progress Notes (Addendum)
      [address]       [place] [address]             [phone]         Procedure(s) (LRB): VIDEO BRONCHOSCOPY (N/A) VIDEO ASSISTED THORACOSCOPY (Right) DRAINAGE OF PLEURAL EFFUSION (Right) PLEURAL BIOPSY (N/A)  Subjective: He is on HD this am. Only complaint is occasional "abdominal tightness". Denies nausea or emesis.  Objective: Vital signs in last 24 hours: Temp:  [98.1 F (36.7 C)-98.9 F (37.2 C)] 98.1 F (36.7 C) (03/17 0458) Pulse Rate:  [40-89] 81 (03/17 0458) Cardiac Rhythm:  [-] Atrial fibrillation (03/16 2000) Resp:  [18-22] 18 (03/17 0458) BP: (104-115)/(41-102) 109/43 mmHg (03/17 0458) SpO2:  [97 %-100 %] 98 % (03/17 0458)     Intake/Output from previous day: 03/16 0701 - 03/17 0700 In: 920.6 [P.O.:850; I.V.:70.6] Out: 550 [Chest Tube:550]   Physical Exam:  Cardiovascular: IRRR Pulmonary: Diminished right base and mostly clear on left; no rales, wheezes, or rhonchi. Abdomen: Soft, non tender, bowel sounds present. Wounds: Dressing is clean and dry.  tion. Chest Tube: to water seal, no air leak  Lab Results: CBC: Recent Labs  11/11/14 0437 11/12/14 0602  WBC 8.8 8.3  HGB 10.6* 9.5*  HCT 34.6* 31.0*  PLT 243 204   BMET:  Recent Labs  11/10/14 1700 11/11/14 0437  NA 136 137  K 5.8* 4.5  CL 97 98  CO2 21 27  GLUCOSE 141* 99  BUN 42* 18  CREATININE 8.35* 5.16*  CALCIUM 7.7* 7.8*    PT/INR:  Recent Labs  11/09/14 1055  LABPROT 16.4*  INR 1.31   ABG:  INR: Will add last result for INR, ABG once components are confirmed Will add last 4 CBG results once components are confirmed  Assessment/Plan:  1. CV - History of a fib on chronic anticoagulation. On Amiodarone 200 mg daily and Lopressor 25 bid. On heparin drip. 2.  Pulmonary - Chest tube with 200 cc last 24 hours. Chest tube is to suction. CXR this am shows enlargement of cardiac silhouette, pulmonary vascular congestion, and persistent, loculated right  pleural effusion and atelectasis. As discussed with Dr. Servando Snare, CT to remain. Unknown cause of right pleural effusion but suspect previous bleed/hematoma from months ago.  3. Anemia of chronic disease-H and H down to 9.5 and 31. On Epo and Nulecit 4. ESRD-on HD. Per nephrology 5.ID-On Vanco and Zosyn. Pleural abscess shows no orgnaisms, no growth to date 6.DM-CBGs 147/129. On Insulin. Per medicine  ZIMMERMAN,DONIELLE MPA-C 11/12/2014,8:49 AM  Chest xray improved I have seen and examined [name] [name] and agree with the above assessment  and plan.  Grace Isaac MD Beeper [phone] Office [phone] 11/12/2014 5:50 PM

## 2014-11-13 LAB — HEPARIN LEVEL (UNFRACTIONATED)
HEPARIN UNFRACTIONATED: 0.28 [IU]/mL — AB (ref 0.30–0.70)
Heparin Unfractionated: 0.28 IU/mL — ABNORMAL LOW (ref 0.30–0.70)
Heparin Unfractionated: 0.39 IU/mL (ref 0.30–0.70)

## 2014-11-13 LAB — GLUCOSE, CAPILLARY
Glucose-Capillary: 99 mg/dL (ref 70–99)
Glucose-Capillary: 129 mg/dL — ABNORMAL HIGH (ref 70–99)
Glucose-Capillary: 138 mg/dL — ABNORMAL HIGH (ref 70–99)

## 2014-11-13 LAB — CBC
HCT: 32 % — ABNORMAL LOW (ref 39.0–52.0)
HEMOGLOBIN: 10 g/dL — AB (ref 13.0–17.0)
MCH: 27.8 pg (ref 26.0–34.0)
MCHC: 31.3 g/dL (ref 30.0–36.0)
MCV: 88.9 fL (ref 78.0–100.0)
Platelets: 210 10*3/uL (ref 150–400)
RBC: 3.6 MIL/uL — AB (ref 4.22–5.81)
RDW: 16.3 % — ABNORMAL HIGH (ref 11.5–15.5)
WBC: 8.2 10*3/uL (ref 4.0–10.5)

## 2014-11-13 MED ORDER — HEPARIN (PORCINE) IN NACL 100-0.45 UNIT/ML-% IJ SOLN
1900.0000 [IU]/h | INTRAMUSCULAR | Status: DC
  Administered 2014-11-13 – 2014-11-18 (×6): 1900 [IU]/h via INTRAVENOUS
  Filled 2014-11-13 (×14): qty 250

## 2014-11-13 NOTE — Clinical Documentation Improvement (Signed)
  Query #1 "CHF" documented in current medical record.  If known or able to determine, please document the ACUITY and TYPE of Heart Failure:  Acuity - Acute; Chronic; Acute on Chronic  AND  Type - Systolic; Diastolic; Combined   Thank You, Erling Conte ,RN Clinical Documentation Specialist:  [place] Information Management

## 2014-11-13 NOTE — Progress Notes (Addendum)
       [address]       [place] [address]             [phone]            Procedure(s) (LRB): VIDEO BRONCHOSCOPY (N/A) VIDEO ASSISTED THORACOSCOPY (Right) DRAINAGE OF PLEURAL EFFUSION (Right) PLEURAL BIOPSY (N/A)  Subjective: Stable, no complaints this am.  Objective: Vital signs in last 24 hours: Patient Vitals for the past 24 hrs:  BP Temp Temp src Pulse Resp SpO2 Weight  11/13/14 0601 99/63 mmHg 98.4 F (36.9 C) Oral 83 18 98 % 179 lb 7.3 oz (81.4 kg)  11/12/14 2109 (!) 89/54 mmHg 98.3 F (36.8 C) Oral 96 18 99 % -  11/12/14 1500 90/76 mmHg - - - - - -  11/12/14 1300 (!) 89/57 mmHg 98.1 F (36.7 C) Oral (!) 46 18 100 % -  11/12/14 1258 (!) 115/53 mmHg 97.4 F (36.3 C) Oral (!) 114 20 98 % -  11/12/14 1230 (!) 102/57 mmHg - - (!) 111 (!) 22 - -  11/12/14 1200 117/69 mmHg - - (!) 106 (!) 30 - -  11/12/14 1130 (!) 110/50 mmHg - - (!) 108 18 - -  11/12/14 1100 95/62 mmHg - - 100 16 - -  11/12/14 1030 (!) 132/102 mmHg - - 80 18 - -  11/12/14 1000 130/73 mmHg - - 96 16 - -  11/12/14 0930 118/87 mmHg - - 90 (!) 30 - -  11/12/14 0857 98/63 mmHg - - 89 (!) 24 - -  11/12/14 0844 (!) 104/49 mmHg 98.1 F (36.7 C) Oral 92 (!) 22 98 % -   Current Weight  11/13/14 179 lb 7.3 oz (81.4 kg)     Intake/Output from previous day: 03/17 0701 - 03/18 0700 In: 440 [P.O.:440] Out: 3821 [Stool:1; Chest Tube:320]    PHYSICAL EXAM:  Heart: Irr irr Lungs: Decreased in bases Wound: CT site stable Chest tube: No air leak    Lab Results: CBC: Recent Labs  11/12/14 0602 11/13/14 0058  WBC 8.3 8.2  HGB 9.5* 10.0*  HCT 31.0* 32.0*  PLT 204 210   BMET:  Recent Labs  11/10/14 1700 11/11/14 0437  NA 136 137  K 5.8* 4.5  CL 97 98  CO2 21 27  GLUCOSE 141* 99  BUN 42* 18  CREATININE 8.35* 5.16*  CALCIUM 7.7* 7.8*    PT/INR: No results for input(s): LABPROT, INR in the last 72 hours.    Assessment/Plan: S/P Procedure(s) (LRB): VIDEO  BRONCHOSCOPY (N/A) VIDEO ASSISTED THORACOSCOPY (Right) DRAINAGE OF PLEURAL EFFUSION (Right) PLEURAL BIOPSY (N/A) CT continues to drain, around 300 ml/past 24 hours.  Continue CT for now.  No CXR this am. Medical issues per renal, IM.    LOS: 4 days    COLLINS,GINA H 11/13/2014  Lab Results  Component Value Date   INR 1.31 11/09/2014   INR 1.8 10/19/2014   INR 1.8 10/08/2014    Follow up chest xray in am Leave chest tube for now I have seen and examined Florene Route Grey and agree with the above assessment  and plan.  Grace Isaac MD Beeper [phone] Office [phone] 11/13/2014 6:04 PM

## 2014-11-13 NOTE — Progress Notes (Signed)
Utilization review completed.  

## 2014-11-13 NOTE — Progress Notes (Signed)
Subjective: Interval History: has no complaint .  Objective: Vital signs in last 24 hours: Temp:  [97.4 F (36.3 C)-98.4 F (36.9 C)] 98.4 F (36.9 C) (03/18 0601) Pulse Rate:  [46-114] 83 (03/18 0601) Resp:  [16-30] 18 (03/18 0601) BP: (89-132)/(49-102) 99/63 mmHg (03/18 0601) SpO2:  [98 %-100 %] 98 % (03/18 0601) Weight:  [81.4 kg (179 lb 7.3 oz)] 81.4 kg (179 lb 7.3 oz) (03/18 0601) Weight change:   Intake/Output from previous day: 03/17 0701 - 03/18 0700 In: 440 [P.O.:440] Out: 3821 [Stool:1; Chest Tube:320] Intake/Output this shift:    General appearance: alert, cooperative and no distress Resp: diminished breath sounds RLL, rales RLL and rhonchi RLL Cardio: irregularly irregular rhythm, S1, S2 normal and systolic murmur: holosystolic 2/6, blowing at apex GI: soft, non-tender; bowel sounds normal; no masses,  no organomegaly Extremities: AVF RLA  RAKA  Lab Results:  Recent Labs  11/12/14 0602 11/13/14 0058  WBC 8.3 8.2  HGB 9.5* 10.0*  HCT 31.0* 32.0*  PLT 204 210   BMET:  Recent Labs  11/10/14 1700 11/11/14 0437  NA 136 137  K 5.8* 4.5  CL 97 98  CO2 21 27  GLUCOSE 141* 99  BUN 42* 18  CREATININE 8.35* 5.16*  CALCIUM 7.7* 7.8*   No results for input(s): PTH in the last 72 hours. Iron Studies: No results for input(s): IRON, TIBC, TRANSFERRIN, FERRITIN in the last 72 hours.  Studies/Results: Dg Chest 2 View  11/12/2014   CLINICAL DATA:  Chest tube, essential benign hypertension, type 2 diabetes, coronary artery disease post CABG, atrial fibrillation, end-stage renal disease  EXAM: CHEST  2 VIEW  COMPARISON:  Portable exam of 11/11/2014, chest CT 11/09/2014  FINDINGS: RIGHT basilar pigtail thoracostomy tube unchanged.  Enlargement of cardiac silhouette post MVR and CABG.  Pulmonary vascular congestion.  Persistent atelectasis of RIGHT upper and RIGHT lower lobes.  Persistent loculated RIGHT pleural effusion.  Probable mild pulmonary edema.  No  pneumothorax.  Bones demineralized.  IMPRESSION: Enlargement of cardiac silhouette with pulmonary vascular congestion and probable while pulmonary edema.  Persistent loculated RIGHT pleural effusion and RIGHT lung atelectasis.   Electronically Signed   By: Lavonia Dana M.D.   On: 11/12/2014 08:10   Dg Chest Port 1 View  11/11/2014   CLINICAL DATA:  64 year old male with chest pain, pleural effusion. Initial encounter.  EXAM: PORTABLE CHEST - 1 VIEW  COMPARISON:  CT-guided chest tube placement images 31516 and earlier.  FINDINGS: Portable AP semi upright view at 1104 hours. Percutaneous pigtail right pleural drain remains in place with regressed thick walled pleural collection. Lower lung volumes compared to 11/09/2014. Stable cardiac size and mediastinal contours. No new cardiopulmonary abnormality identified.  IMPRESSION: Regressed right lung base pleural collection following percutaneous drainage catheter placement. No new cardiopulmonary abnormality identified.   Electronically Signed   By: Genevie Ann M.D.   On: 11/11/2014 11:54    I have reviewed the patient's current medications.  Assessment/Plan: 1ESRD HD in am. 2 Afib rate controlled, and anticoag 3 Anemia stable 4 HPTH 5 PVD AKA 6 Empyema CT ,on AB would limit duration Zosyn and cont vanc, can give at HD 7 CAD 8 CM P HD, AB, limit duration, amio, anticoag   LOS: 4 days   Robbin Loughmiller,Antwoine L 11/13/2014,8:42 AM

## 2014-11-13 NOTE — Progress Notes (Signed)
Medicare Important Message given? YES  (If response is "NO", the following Medicare IM given date fields will be blank)  Date Medicare IM given: 11/13/14 Medicare IM given by:  Lorell Thibodaux  

## 2014-11-13 NOTE — Progress Notes (Signed)
ANTICOAGULATION CONSULT NOTE - Follow Up Consult  Pharmacy Consult for Heparin  Indication: atrial fibrillation  Allergies  Allergen Reactions  . Bacitracin Other (See Comments)    unknown  . Norvasc [Amlodipine Besylate] Other (See Comments)    unknown    Patient Measurements: Height: 6\' 4"  (193 cm) Weight: 188 lb 4.4 oz (85.4 kg) IBW/kg (Calculated) : 86.8  Vital Signs: Temp: 98.3 F (36.8 C) (03/17 2109) Temp Source: Oral (03/17 2109) BP: 89/54 mmHg (03/17 2109) Pulse Rate: 96 (03/17 2109)  Labs:  Recent Labs  11/10/14 1700 11/11/14 0437  11/12/14 0602 11/12/14 1520 11/13/14 0001  HGB 10.2* 10.6*  --  9.5*  --   --   HCT 33.0* 34.6*  --  31.0*  --   --   PLT 295 243  --  204  --   --   HEPARINUNFRC  --   --   < > 0.29* <0.10* 0.28*  CREATININE 8.35* 5.16*  --   --   --   --   < > = values in this interval not displayed.  Estimated Creatinine Clearance: 17.2 mL/min (by C-G formula based on Cr of 5.16). Assessment: Slightly sub-therapeutic heparin level, no issues per RN.   Goal of Therapy:  Heparin level 0.3-0.7 units/ml Monitor platelets by anticoagulation protocol: Yes   Plan:  -Increase heparin drip to 1750 units/hr -1000 HL -Daily CBC/HL -Monitor for bleeding  Martise, Waddell 11/13/2014,1:51 AM

## 2014-11-13 NOTE — Progress Notes (Signed)
PROGRESS NOTE  [name] [name] VVO:[id number] DOB: Aug 31, 1950 DOA: 11/09/2014 PCP: [name], MD  HPI/Recap of past 47 hours: 64 year old male with past medical history of atrial fibrillation, diabetes mellitus and end-stage renal disease on hemodialysis as well as previous MRSA pleural effusion admitted on 3/14 for 3 days of shortness of breath and found to have complex fluid collection in right thorax, transferred to [facility].  Patient seen by cardiothoracic surgery and right sided chest tube placed. This continued to drain since, approximately 5 [name] [name] Assessment/Plan: Active Problems:   Atrial fibrillation: On chronic anticoagulation. Discussed with vascular surgery and started on IV heparin. Continue amiodarone and beta blocker   ESRD (end stage renal disease) on dialysis: Nephrology following. Tolerated HD today   DM type 2 causing ESRD: Blood sugars remained stable, under 150   Anemia of chronic disease: Overall stable staying above 10   Recurrent right pleural effusion: Status post chest tube. Appreciate cardiothoracic surgery help, continue chest tube. Cause is suspected to be secondary to previous bleed several months ago   Code Status: Full code  Family Communication: Left message with family  Disposition Plan: Home once chest tube removed   Consultants:  Cardiothoracic surgery  Procedures:  Chest tube placement done 3/15  Antibiotics:  IV vancomycin 3/15-present  IV Zosyn 3/15-present   Objective: BP 109/97 mmHg  Pulse 110  Temp(Src) 98 F (36.7 C) (Oral)  Resp 18  Ht 6\' 4"  (1.93 m)  Wt 81.4 kg (179 lb 7.3 oz)  BMI 21.85 kg/m2  SpO2 98%  Intake/Output Summary (Last 24 hours) at 11/13/14 1633 Last data filed at 11/13/14 1500  Gross per 24 hour  Intake    930 ml  Output    101 ml  Net    829 ml   Filed Weights   11/10/14 1640 11/10/14 2026 11/13/14 0601  Weight: 87.4 kg (192 lb 10.9 oz) 85.4 kg (188 lb 4.4 oz) 81.4 kg  (179 lb 7.3 oz)    Exam: No change  General:  Alert and oriented 3, no acute distress  Cardiovascular: Irregular rhythm, rate controlled  Respiratory: Clear to auscultation bilaterally  Abdomen: Soft, nontender, nondistended, positive bowel sounds  Musculoskeletal: No clubbing or cyanosis or edema   Data Reviewed: Basic Metabolic Panel:  Recent Labs Lab 11/09/14 1055 11/10/14 1700 11/11/14 0437  NA 140 136 137  K 4.4 5.8* 4.5  CL 100 97 98  CO2 28 21 27   GLUCOSE 92 141* 99  BUN 30* 42* 18  CREATININE 6.85* 8.35* 5.16*  CALCIUM 7.8* 7.7* 7.8*  PHOS  --  5.3*  --    Liver Function Tests:  Recent Labs Lab 11/10/14 1700  ALBUMIN 2.4*   No results for input(s): LIPASE, AMYLASE in the last 168 hours. No results for input(s): AMMONIA in the last 168 hours. CBC:  Recent Labs Lab 11/09/14 1055 11/10/14 1700 11/11/14 0437 11/12/14 0602 11/13/14 0058  WBC 7.9 9.2 8.8 8.3 8.2  NEUTROABS 6.1  --   --   --   --   HGB 10.0* 10.2* 10.6* 9.5* 10.0*  HCT 33.0* 33.0* 34.6* 31.0* 32.0*  MCV 94.0 90.2 90.3 89.1 88.9  PLT 238 295 243 204 210   Cardiac Enzymes:    Recent Labs Lab 11/09/14 1055  TROPONINI <0.03   BNP (last 3 results) No results for input(s): BNP in the last 8760 hours.  ProBNP (last 3 results)  Recent Labs  08/15/14 0245  PROBNP >  70000.0*    CBG:  Recent Labs Lab 11/12/14 0617 11/12/14 1346 11/12/14 1632 11/12/14 2100 11/13/14 0637  GLUCAP 129* 85 123* 150* 99    Recent Results (from the past 240 hour(s))  MRSA PCR Screening     Status: Abnormal   Collection Time: 11/09/14  7:07 PM  Result Value Ref Range Status   MRSA by PCR POSITIVE (A) NEGATIVE Final    Comment:        The GeneXpert MRSA Assay (FDA approved for NASAL specimens only), is one component of a comprehensive MRSA colonization surveillance program. It is not intended to diagnose MRSA infection nor to guide or monitor treatment for MRSA infections. RESULT  CALLED TO, READ BACK BY AND VERIFIED WITH: SPRADLING,S RN [id number] 11/09/14 [name] [name],[name] [name]   Culture, routine-abscess     Status: None (Preliminary result)   Collection Time: 11/10/14  2:38 PM  Result Value Ref Range Status   Specimen Description ABSCESS RIGHT PLEURAL  Final   Special Requests NONE  Final   Gram Stain   Final    ABUNDANT WBC PRESENT,BOTH PMN AND MONONUCLEAR NO SQUAMOUS EPITHELIAL CELLS SEEN NO ORGANISMS SEEN Performed at [facility]    Culture   Final    NO GROWTH 2 DAYS Performed at [facility]    Report Status PENDING  Incomplete     Studies: Dg Chest 2 View  11/12/2014   CLINICAL DATA:  Chest tube, essential benign hypertension, type 2 diabetes, coronary artery disease post CABG, atrial fibrillation, end-stage renal disease  EXAM: CHEST  2 VIEW  COMPARISON:  Portable exam of 11/11/2014, chest CT 11/09/2014  FINDINGS: RIGHT basilar pigtail thoracostomy tube unchanged.  Enlargement of cardiac silhouette post MVR and CABG.  Pulmonary vascular congestion.  Persistent atelectasis of RIGHT upper and RIGHT lower lobes.  Persistent loculated RIGHT pleural effusion.  Probable mild pulmonary edema.  No pneumothorax.  Bones demineralized.  IMPRESSION: Enlargement of cardiac silhouette with pulmonary vascular congestion and probable while pulmonary edema.  Persistent loculated RIGHT pleural effusion and RIGHT lung atelectasis.   Electronically Signed   By: Lavonia Dana M.D.   On: 11/12/2014 08:10    Scheduled Meds: . allopurinol  100 mg Oral Daily  . amiodarone  200 mg Oral Daily  . Chlorhexidine Gluconate Cloth  6 each Topical Q0600  . cinacalcet  30 mg Oral Q breakfast  . Darbepoetin Alfa  60 mcg Intravenous Q Thu-HD  . ferric gluconate (FERRLECIT/NULECIT) IV  62.5 mg Intravenous Q Thu-1800  . FLUoxetine  20 mg Oral Daily  . insulin aspart  0-5 Units Subcutaneous QHS  . insulin aspart  0-9 Units Subcutaneous TID WC  . insulin glargine  10 Units Subcutaneous QHS    . midodrine  10 mg Oral BID WC  . multivitamin  1 tablet Oral QHS  . mupirocin ointment  1 application Nasal BID  . pantoprazole  40 mg Oral BID  . piperacillin-tazobactam (ZOSYN)  IV  2.25 g Intravenous Q8H  . sevelamer carbonate  800 mg Oral TID WC  . sodium chloride  3 mL Intravenous Q12H  . sodium chloride  3 mL Intravenous Q12H  . traZODone  50 mg Oral QHS  . vancomycin  1,000 mg Intravenous Q T,Th,Sa-HD    Continuous Infusions: . heparin 1,900 Units/hr (11/13/14 1153)     Time spent: 15 min  [facility] Pager [phone]. If 7PM-7AM, please contact night-coverage at [email or web address], password [facility] 11/13/2014, 4:33 PM  LOS: 4  days

## 2014-11-13 NOTE — Progress Notes (Addendum)
ANTICOAGULATION CONSULT NOTE - Follow Up Consult ANTIBIOTIC CONSULT NOTE - Follow Up Consult  Pharmacy Consult for Heparin; Vancomycin and Zosyn Indication: atrial fibrillation; complex pleural effusion  Allergies  Allergen Reactions  . Bacitracin Other (See Comments)    unknown  . Norvasc [Amlodipine Besylate] Other (See Comments)    unknown    Patient Measurements: Height: 6\' 4"  (193 cm) Weight: 179 lb 7.3 oz (81.4 kg) IBW/kg (Calculated) : 86.8  Vital Signs: Temp: 98.4 F (36.9 C) (03/18 0601) Temp Source: Oral (03/18 1054) BP: 99/63 mmHg (03/18 0601) Pulse Rate: 85 (03/18 1054)  Labs:  Recent Labs  11/10/14 1700 11/11/14 0437  11/12/14 0602 11/12/14 1520 11/13/14 0001 11/13/14 0058 11/13/14 0935  HGB 10.2* 10.6*  --  9.5*  --   --  10.0*  --   HCT 33.0* 34.6*  --  31.0*  --   --  32.0*  --   PLT 295 243  --  204  --   --  210  --   HEPARINUNFRC  --   --   < > 0.29* <0.10* 0.28*  --  0.28*  CREATININE 8.35* 5.16*  --   --   --   --   --   --   < > = values in this interval not displayed.  Estimated Creatinine Clearance: 16.4 mL/min (by C-G formula based on Cr of 5.16).   Medications:  Heparin @ 1750 units/hr  Assessment: 63yom continues on heparin for afib while coumadin remains on hold. Chest tube still in place and continues to drain. Heparin level is unchanged from this morning despite rate increase. CBC is stable. No bleeding reported.  Also continues on day #4 vancomycin and zosyn for a complex pleural effusion s/p VATS and drainage. He is on his usual HD schedule TTS and has been tolerating all of his sessions.  Vancomycin 3/15>> Zosyn 3/15>>   3/15 Pleural abscess>> ngtd  Goal of Therapy:  Heparin level 0.3-0.7 units/ml Monitor platelets by anticoagulation protocol: Yes  Pre-HD Vancomycin level 15-25   Plan:  1) Increase heparin to 1900 units/hr 2) Check heparin level in 8 hours 3) Continue vancomycin 1g IV qHD TTS 4) Continue zosyn 2.25g  IV q8  [name] 11/13/2014,11:18 AM

## 2014-11-13 NOTE — Progress Notes (Signed)
ANTICOAGULATION CONSULT NOTE - Follow Up Consult  Pharmacy Consult for Heparin Indication: atrial fibrillation  Allergies  Allergen Reactions  . Bacitracin Other (See Comments)    unknown  . Norvasc [Amlodipine Besylate] Other (See Comments)    unknown    Patient Measurements: Height: 6\' 4"  (193 cm) Weight: 179 lb 7.3 oz (81.4 kg) IBW/kg (Calculated) : 86.8 Heparin Dosing Weight: 85 kg  Vital Signs: Temp: 97.9 F (36.6 C) (03/18 2031) Temp Source: Oral (03/18 2031) BP: 120/77 mmHg (03/18 2031) Pulse Rate: 50 (03/18 2031)  Labs:  Recent Labs  11/11/14 0437  11/12/14 0602  11/13/14 0001 11/13/14 0058 11/13/14 0935 11/13/14 1951  HGB 10.6*  --  9.5*  --   --  10.0*  --   --   HCT 34.6*  --  31.0*  --   --  32.0*  --   --   PLT 243  --  204  --   --  210  --   --   HEPARINUNFRC  --   < > 0.29*  < > 0.28*  --  0.28* 0.39  CREATININE 5.16*  --   --   --   --   --   --   --   < > = values in this interval not displayed.  Estimated Creatinine Clearance: 16.4 mL/min (by C-G formula based on Cr of 5.16).   Medications:  Infusions:  . heparin 1,900 Units/hr (11/13/14 1153)    Assessment: 65 y/o male who presented on 3/14 with a 3 day history of increasing shortness of breath with productive cough. He was on chronic Coumadin PTA for Afib. Coumadin was held for chest tube placement 3/16. He continues on IV heparin.  Heparin level is now therapeutic on 1900 units/hr.  No IV issues or bleeding noted.  Goal of Therapy:  Heparin level 0.3-0.7 units/ml Monitor platelets by anticoagulation protocol: Yes   Plan:  Continue heparin drip at 1900 units/hr Daily heparin level and CBC Monitor for s/sx of bleeding  [employer], Pharm.D., BCPS Clinical Pharmacist Pager [phone] 11/13/2014 9:12 PM

## 2014-11-13 NOTE — Progress Notes (Signed)
Referring Physician(s): Dr Servando Snare  Subjective:  R loculated pleural effusion chest tube drain placed 3/15 Better daily 1 liter in pleurvac now (800)   Allergies: Bacitracin and Norvasc  Medications: Prior to Admission medications   Medication Sig Start Date End Date Taking? Authorizing Provider  albuterol (PROVENTIL) (2.5 MG/3ML) 0.083% nebulizer solution Take 3 mLs (2.5 mg total) by nebulization every 2 (two) hours as needed for wheezing. 02/21/14  Yes Kinnie Feil, MD  allopurinol (ZYLOPRIM) 100 MG tablet Take 100 mg by mouth daily.  05/15/11  Yes Historical Provider, MD  amiodarone (PACERONE) 200 MG tablet Take 1 tablet (200 mg total) by mouth daily. 08/31/14  Yes Satira Sark, MD  B Complex-C-Zn-Folic Acid (DIALYVITE/ZINC PO) Take by mouth 3 (three) times a week. Patient has dialysis on Tuesdays, Thursdays, and Saturdays   Yes Historical Provider, MD  FLUoxetine (PROZAC) 20 MG capsule Take 20 mg by mouth daily. 09/21/14  Yes Historical Provider, MD  folic acid (FOLVITE) 1 MG tablet Take 1 mg by mouth daily.     Yes Historical Provider, MD  insulin glargine (LANTUS) 100 UNIT/ML injection Inject 10 Units into the skin at bedtime.   Yes Historical Provider, MD  metoprolol (LOPRESSOR) 50 MG tablet Take 75 mg by mouth 2 (two) times daily. 07/04/14  Yes Historical Provider, MD  midodrine (PROAMATINE) 10 MG tablet Take 1 tablet (10 mg total) by mouth 2 (two) times daily with a meal. 08/20/14  Yes Silver Huguenin Elgergawy, MD  pantoprazole (PROTONIX) 40 MG tablet Take 40 mg by mouth 2 (two) times daily.  05/25/11  Yes Historical Provider, MD  RENVELA 800 MG tablet Take 800 mg by mouth 3 (three) times daily with meals.  05/03/11  Yes Historical Provider, MD  SENSIPAR 30 MG tablet Take 30 mg by mouth daily. 01/22/14  Yes Historical Provider, MD  traMADol (ULTRAM) 50 MG tablet Take by mouth every 8 (eight) hours as needed for moderate pain.   Yes Historical Provider, MD  traZODone (DESYREL) 50  MG tablet Take 1 tablet (50 mg total) by mouth at bedtime. 08/13/13  Yes Nita Sells, MD  warfarin (COUMADIN) 1 MG tablet Take 1/2 tablet daily Patient taking differently: Take 1/2 tablet qhs 09/08/14  Yes Satira Sark, MD  HYDROcodone-acetaminophen (NORCO/VICODIN) 5-325 MG per tablet Take 1 tablet by mouth every 6 (six) hours as needed for moderate pain. 08/06/14   Carole Civil, MD     Vital Signs: BP 109/97 mmHg  Pulse 110  Temp(Src) 98 F (36.7 C) (Oral)  Resp 18  Ht 6\' 4"  (1.93 m)  Wt 81.4 kg (179 lb 7.3 oz)  BMI 21.85 kg/m2  SpO2 98%  Physical Exam  Pulmonary/Chest: Effort normal and breath sounds normal.  Rt chest tube drain intact Output dark brown 1 liter in pleurvac No air leak Site clean and dry; NT No bleeding     Imaging: Dg Chest 2 View  11/12/2014   CLINICAL DATA:  Chest tube, essential benign hypertension, type 2 diabetes, coronary artery disease post CABG, atrial fibrillation, end-stage renal disease  EXAM: CHEST  2 VIEW  COMPARISON:  Portable exam of 11/11/2014, chest CT 11/09/2014  FINDINGS: RIGHT basilar pigtail thoracostomy tube unchanged.  Enlargement of cardiac silhouette post MVR and CABG.  Pulmonary vascular congestion.  Persistent atelectasis of RIGHT upper and RIGHT lower lobes.  Persistent loculated RIGHT pleural effusion.  Probable mild pulmonary edema.  No pneumothorax.  Bones demineralized.  IMPRESSION: Enlargement of cardiac silhouette  with pulmonary vascular congestion and probable while pulmonary edema.  Persistent loculated RIGHT pleural effusion and RIGHT lung atelectasis.   Electronically Signed   By: Lavonia Dana M.D.   On: 11/12/2014 08:10   Dg Chest Port 1 View  11/11/2014   CLINICAL DATA:  64 year old male with chest pain, pleural effusion. Initial encounter.  EXAM: PORTABLE CHEST - 1 VIEW  COMPARISON:  CT-guided chest tube placement images 31516 and earlier.  FINDINGS: Portable AP semi upright view at 1104 hours. Percutaneous  pigtail right pleural drain remains in place with regressed thick walled pleural collection. Lower lung volumes compared to 11/09/2014. Stable cardiac size and mediastinal contours. No new cardiopulmonary abnormality identified.  IMPRESSION: Regressed right lung base pleural collection following percutaneous drainage catheter placement. No new cardiopulmonary abnormality identified.   Electronically Signed   By: Genevie Ann M.D.   On: 11/11/2014 11:54   Ct Perc Pleural Drain W/indwell Cath W/img Guide  11/10/2014   INDICATION: Recurrent symptomatic right-sided pleural effusion - as patient has multiple medical comorbidities and is a poor operative candidate, request made by consulting thoracic surgeon, Dr. Servando Snare, for placement of a CT-guided large bore catheter.  EXAM: CT PERC PLEURAL DRAIN W/INDWELL CATH W/IMG GUIDE  COMPARISON:  Chest CT - 11/09/2014; ultrasound-guided right-sided thoracentesis - 08/16/2014  MEDICATIONS: The patient is currently admitted to the hospital and receiving intravenous antibiotics. The antibiotics were administered within an appropriate time frame prior to the initiation of the procedure.  ANESTHESIA/SEDATION: Fentanyl 50 mcg IV; Versed 1 mg IV  Total Moderate Sedation time  22 minutes  CONTRAST:  None  COMPLICATIONS: None immediate  PROCEDURE: Informed written consent was obtained from the patient after a discussion of the risks, benefits and alternatives to treatment. The patient was placed supine, slightly LPO on the CT gantry and a pre procedural CT was performed re-demonstrating the known complex right-sided pleural effusion. The procedure was planned. A timeout was performed prior to the initiation of the procedure.  The right lateral chest was prepped and draped in the usual sterile fashion. The overlying soft tissues were anesthetized with 1% lidocaine with epinephrine. Appropriate trajectory was planned with the use of a 22 gauge spinal needle. An 18 gauge trocar needle was  advanced into the abscess/fluid collection and a short Amplatz super stiff wire was coiled within the collection. Appropriate positioning was confirmed with a limited CT scan. The tract was serially dilated allowing placement of a 14 French all-purpose drainage catheter. Appropriate positioning was confirmed with a limited postprocedural CT scan.  Approximately 1 L foul smelling brown fluid was aspirated from the right pleural space. Following removal for approximately 1 L of purulent material, limited CT scanning was performed demonstrating development of a small loculated ex vacuo pneumothorax within the right basilar pleural space. The tube was connected to a pleural vac and sutured in place. A dressing was placed. The patient tolerated the procedure well without immediate post procedural complication.  IMPRESSION: Successful CT guided placement of a 62 French all purpose drain catheter into the right pleural space with aspiration of 1 L of purulent, brown-colored fluid. Samples were sent to the laboratory as requested by the ordering clinical team.   Electronically Signed   By: Sandi Mariscal M.D.   On: 11/10/2014 16:23    Labs:  CBC:  Recent Labs  11/10/14 1700 11/11/14 0437 11/12/14 0602 11/13/14 0058  WBC 9.2 8.8 8.3 8.2  HGB 10.2* 10.6* 9.5* 10.0*  HCT 33.0* 34.6* 31.0* 32.0*  PLT 295 243 204 210    COAGS:  Recent Labs  10/08/14 1240 10/08/14 1243 10/19/14 1712 11/09/14 1055  INR 1.8 1.8 1.8 1.31    BMP:  Recent Labs  09/29/14 1257 11/09/14 1055 11/10/14 1700 11/11/14 0437  NA 138 140 136 137  K 3.8 4.4 5.8* 4.5  CL 99 100 97 98  CO2 28 28 21 27   GLUCOSE 224* 92 141* 99  BUN 18 30* 42* 18  CALCIUM 7.9* 7.8* 7.7* 7.8*  CREATININE 3.92* 6.85* 8.35* 5.16*  GFRNONAA 15* 8* 6* 11*  GFRAA 17* 9* 7* 12*    LIVER FUNCTION TESTS:  Recent Labs  02/27/14 2022  08/13/14 0403 08/15/14 0245 08/16/14 1616 08/17/14 0320 08/17/14 1022 09/29/14 1257 11/10/14 1700    BILITOT 1.2  --  1.5* 2.0*  --   --   --  1.7*  --   AST 19  --  34 34  --   --   --  29  --   ALT 10  --  20 24  --   --   --  14  --   ALKPHOS 92  --  168* 151*  --   --   --  127*  --   PROT 7.0  --  7.1 6.5 6.6  --   --  8.2  --   ALBUMIN 2.1*  < > 2.5* 2.1*  --  1.9* 2.0* 2.3* 2.4*  < > = values in this interval not displayed.  Assessment and Plan:  Rt loculated pleural effusion Chest tube placed 3/15 Better Plan per Dr Servando Snare  Signed: Monia Sabal A 11/13/2014, 2:23 PM   I spent a total of 15 Minutes in face to face in clinical consultation/evaluation, greater than 50% of which was counseling/coordinating care for Rt chest tube

## 2014-11-14 ENCOUNTER — Inpatient Hospital Stay (HOSPITAL_COMMUNITY): Payer: Medicare Other

## 2014-11-14 LAB — GLUCOSE, CAPILLARY
Glucose-Capillary: 91 mg/dL (ref 70–99)
Glucose-Capillary: 105 mg/dL — ABNORMAL HIGH (ref 70–99)
Glucose-Capillary: 136 mg/dL — ABNORMAL HIGH (ref 70–99)

## 2014-11-14 LAB — CBC
HCT: 32.1 % — ABNORMAL LOW (ref 39.0–52.0)
HEMOGLOBIN: 10 g/dL — AB (ref 13.0–17.0)
MCH: 27.8 pg (ref 26.0–34.0)
MCHC: 31.2 g/dL (ref 30.0–36.0)
MCV: 89.2 fL (ref 78.0–100.0)
Platelets: 195 10*3/uL (ref 150–400)
RBC: 3.6 MIL/uL — ABNORMAL LOW (ref 4.22–5.81)
RDW: 16.1 % — ABNORMAL HIGH (ref 11.5–15.5)
WBC: 8.1 10*3/uL (ref 4.0–10.5)

## 2014-11-14 LAB — RENAL FUNCTION PANEL
ALBUMIN: 2 g/dL — AB (ref 3.5–5.2)
Anion gap: 13 (ref 5–15)
BUN: 24 mg/dL — AB (ref 6–23)
CALCIUM: 7.7 mg/dL — AB (ref 8.4–10.5)
CO2: 25 mmol/L (ref 19–32)
Chloride: 96 mmol/L (ref 96–112)
Creatinine, Ser: 7.03 mg/dL — ABNORMAL HIGH (ref 0.50–1.35)
GFR calc Af Amer: 8 mL/min — ABNORMAL LOW (ref >90)
GFR, EST NON AFRICAN AMERICAN: 7 mL/min — AB (ref >90)
Glucose, Bld: 134 mg/dL — ABNORMAL HIGH (ref 70–99)
Phosphorus: 3.2 mg/dL (ref 2.3–4.6)
Potassium: 3.9 mmol/L (ref 3.5–5.1)
Sodium: 134 mmol/L — ABNORMAL LOW (ref 135–145)

## 2014-11-14 LAB — CULTURE, ROUTINE-ABSCESS: CULTURE: NO GROWTH

## 2014-11-14 LAB — HEPARIN LEVEL (UNFRACTIONATED): Heparin Unfractionated: 0.49 IU/mL (ref 0.30–0.70)

## 2014-11-14 MED ORDER — SODIUM CHLORIDE 0.9 % IV SOLN: 100.0000 mL | INTRAVENOUS | Status: DC | PRN

## 2014-11-14 MED ORDER — NEPRO/CARBSTEADY PO LIQD
237.0000 mL | ORAL | Status: DC | PRN
  Filled 2014-11-14: qty 237

## 2014-11-14 MED ORDER — HEPARIN SODIUM (PORCINE) 1000 UNIT/ML DIALYSIS
100.0000 [IU]/kg | INTRAMUSCULAR | Status: DC | PRN
  Filled 2014-11-14: qty 9

## 2014-11-14 MED ORDER — HEPARIN SODIUM (PORCINE) 1000 UNIT/ML DIALYSIS
1000.0000 [IU] | INTRAMUSCULAR | Status: DC | PRN
  Filled 2014-11-14: qty 1

## 2014-11-14 MED ORDER — LIDOCAINE-PRILOCAINE 2.5-2.5 % EX CREA
1.0000 "application " | TOPICAL_CREAM | CUTANEOUS | Status: DC | PRN
  Filled 2014-11-14: qty 5

## 2014-11-14 MED ORDER — ALTEPLASE 2 MG IJ SOLR
2.0000 mg | Freq: Once | INTRAMUSCULAR | Status: DC | PRN
  Filled 2014-11-14: qty 2

## 2014-11-14 MED ORDER — LIDOCAINE HCL (PF) 1 % IJ SOLN: 5.0000 mL | INTRAMUSCULAR | Status: DC | PRN

## 2014-11-14 MED ORDER — PENTAFLUOROPROP-TETRAFLUOROETH EX AERO: 1.0000 "application " | INHALATION_SPRAY | CUTANEOUS | Status: DC | PRN

## 2014-11-14 NOTE — Progress Notes (Addendum)
      [address]       [place] [address]             [phone]         Procedure(s) (LRB): VIDEO BRONCHOSCOPY (N/A) VIDEO ASSISTED THORACOSCOPY (Right) DRAINAGE OF PLEURAL EFFUSION (Right) PLEURAL BIOPSY (N/A)  Subjective: He has no specific complaints this am.  Objective: Vital signs in last 24 hours: Temp:  [97.9 F (36.6 C)-98 F (36.7 C)] 98 F (36.7 C) (03/19 0332) Pulse Rate:  [50-110] 71 (03/19 0332) Cardiac Rhythm:  [-] Atrial fibrillation (03/18 1951) Resp:  [18-20] 20 (03/19 0332) BP: (109-132)/(77-97) 132/81 mmHg (03/19 0332) SpO2:  [93 %-100 %] 100 % (03/19 0332)     Intake/Output from previous day: 03/18 0701 - 03/19 0700 In: 930 [P.O.:480; IV Piggyback:450] Out: 241 [Stool:1; Chest Tube:240]   Physical Exam:  Cardiovascular: [facility] Pulmonary: Diminished right base and mostly clear on left; no rales, wheezes, or rhonchi. Abdomen: Soft, non tender, bowel sounds present. Wounds: Dressing is clean and dry.   Chest Tube: to water seal, no air leak  Lab Results: CBC:  Recent Labs  11/13/14 0058 11/14/14 0318  WBC 8.2 8.1  HGB 10.0* 10.0*  HCT 32.0* 32.1*  PLT 210 195   BMET: No results for input(s): NA, K, CL, CO2, GLUCOSE, BUN, CREATININE, CALCIUM in the last 72 hours.  PT/INR: No results for input(s): LABPROT, INR in the last 72 hours. ABG:  INR: Will add last result for INR, ABG once components are confirmed Will add last 4 CBG results once components are confirmed  Assessment/Plan:  1. CV - History of a fib on chronic anticoagulation. On Amiodarone 200 mg daily and Lopressor 25 bid. On heparin drip. 2.  Pulmonary - Chest tube with 240 cc last 24 hours?Chest tube is to suction. CXR this am shows persistent, loculated right pleural effusion and atelectasis.  Will remove chest tube once output decreases.Unknown cause of right pleural effusion but suspect previous bleed/hematoma from months ago.  3. Anemia of  chronic disease-H and H down to 10 and 32.1 On Epo and Nulecit 4. ESRD-HD T/TH/Sat. Per nephrology 5.ID-On Vanco and Zosyn. Pleural abscess shows no organisms, no growth to date 6.DM-CBGs 129/138/91. On Insulin. Per medicine  ZIMMERMAN,DONIELLE MPA-C 11/14/2014,9:23 AM  Agree with above. CXR stable.

## 2014-11-14 NOTE — Progress Notes (Signed)
ANTICOAGULATION CONSULT NOTE - Follow Up Consult  Pharmacy Consult for Heparin Indication: atrial fibrillation  Allergies  Allergen Reactions  . Bacitracin Other (See Comments)    unknown  . Norvasc [Amlodipine Besylate] Other (See Comments)    unknown    Patient Measurements: Height: 6\' 4"  (193 cm) Weight: 179 lb 7.3 oz (81.4 kg) IBW/kg (Calculated) : 86.8  Vital Signs: Temp: 98 F (36.7 C) (03/19 0332) Temp Source: Oral (03/19 0332) BP: 132/81 mmHg (03/19 0332) Pulse Rate: 71 (03/19 0332)  Labs:  Recent Labs  11/12/14 0602  11/13/14 0058 11/13/14 0935 11/13/14 1951 11/14/14 0318  HGB 9.5*  --  10.0*  --   --  10.0*  HCT 31.0*  --  32.0*  --   --  32.1*  PLT 204  --  210  --   --  195  HEPARINUNFRC 0.29*  < >  --  0.28* 0.39 0.49  < > = values in this interval not displayed.  Estimated Creatinine Clearance: 16.4 mL/min (by C-G formula based on Cr of 5.16).   Medications:  Heparin @ 1900 units/hr  Assessment: [name] [name] continues on heparin for afib while coumadin remains on hold. Chest tube now to suction. Heparin level is therapeutic. CBC is stable. No bleeding reported.  Goal of Therapy:  Heparin level 0.3-0.7 units/ml Monitor platelets by anticoagulation protocol: Yes     Plan:  1) Continue heparin at 1900 units/hr 2) Follow up heparin level, CBC in AM   [name] [name] 11/14/2014,11:49 AM

## 2014-11-14 NOTE — Progress Notes (Signed)
Subjective: Interval History: has no complaint ,not much appetite.  Objective: Vital signs in last 24 hours: Temp:  [97.9 F (36.6 C)-98 F (36.7 C)] 98 F (36.7 C) (03/19 0332) Pulse Rate:  [50-110] 71 (03/19 0332) Resp:  [18-20] 20 (03/19 0332) BP: (109-132)/(77-97) 132/81 mmHg (03/19 0332) SpO2:  [93 %-100 %] 100 % (03/19 0332) Weight change:   Intake/Output from previous day: 03/18 0701 - 03/19 0700 In: 930 [P.O.:480; IV Piggyback:450] Out: 241 [Stool:1; Chest Tube:240] Intake/Output this shift:    General appearance: alert, cooperative and no distress Resp: diminished breath sounds bilaterally, rhonchi RLL and R CT Cardio: S1, S2 normal and systolic murmur: holosystolic 2/6, blowing at apex GI: pos bs,liver down 4 cm,soft Extremities: AVF B&T, R AKA  Lab Results:  Recent Labs  11/13/14 0058 11/14/14 0318  WBC 8.2 8.1  HGB 10.0* 10.0*  HCT 32.0* 32.1*  PLT 210 195   BMET: No results for input(s): NA, K, CL, CO2, GLUCOSE, BUN, CREATININE, CALCIUM in the last 72 hours. No results for input(s): PTH in the last 72 hours. Iron Studies: No results for input(s): IRON, TIBC, TRANSFERRIN, FERRITIN in the last 72 hours.  Studies/Results: No results found.  I have reviewed the patient's current medications.  Assessment/Plan: 1 ESRD for HD 2 R empyema on AB, drainage. ? Cont Zosyn total of 10 d. Cont Vanco. 3 Anemia 4 DM controlled 5 Afib NSR now on Amio and anticoag 6 PVD 7 anemia stable on darbo 8 HPTH cinnacalcet 9 Hx MVR  P HD, AB,anticoag,amio cinnacalcet,darbo   LOS: 5 days   Abhi Moccia,Osamah L 11/14/2014,8:19 AM

## 2014-11-14 NOTE — Procedures (Signed)
I was present at this session.  I have reviewed the session itself and made appropriate changes. HD via LA avf. bp 90s . Access press ok.    Leeana Creer,Zenon L 3/19/20163:20 PM

## 2014-11-14 NOTE — Progress Notes (Signed)
PROGRESS NOTE  [name] [name] RWE:[id number] DOB: [birth year] DOA: 11/09/2014 PCP: [name], MD  HPI/Recap of past 15 hours: 64 year old male with past medical history of atrial fibrillation, diabetes mellitus and end-stage renal disease on hemodialysis as well as previous MRSA pleural effusion admitted on 3/14 for 3 days of shortness of breath and found to have complex fluid collection in right thorax, transferred to [facility].  Patient seen by cardiothoracic surgery and right sided chest tube placed. This continued to drain since, approximately 5 L.  little output in the last 24 hours, only reported 240 cc. Chest x-ray shows persistent loculated effusion. Assessment/Plan: Active Problems:   Atrial fibrillation: On chronic anticoagulation. Discussed with vascular surgery and started on IV heparin. Continue amiodarone and beta blocker   ESRD (end stage renal disease) on dialysis: Nephrology following. Underwent hemodialysis today   DM type 2 causing ESRD: Blood sugars remained stable, under 150   Anemia of chronic disease: Overall stable staying above 10   Recurrent right pleural effusion: Status post chest tube. Appreciate cardiothoracic surgery help, continue chest tube. Cause is suspected to be secondary to previous bleed several months ago. Chest tube out once effusion off   Code Status: Full code  Family Communication: Left message with family  Disposition Plan: Home once chest tube removed   Consultants:  Cardiothoracic surgery  Procedures:  Chest tube placement done 3/15  Antibiotics:  IV vancomycin 3/15-present  IV Zosyn 3/15-present   Objective: BP 97/72 mmHg  Pulse 109  Temp(Src) 98.4 F (36.9 C) (Oral)  Resp 16  Ht 6\' 4"  (1.93 m)  Wt 86.8 kg (191 lb 5.8 oz)  BMI 23.30 kg/m2  SpO2 100%  Intake/Output Summary (Last 24 hours) at 11/14/14 1644 Last data filed at 11/14/14 1021  Gross per 24 hour  Intake    240 ml  Output     142 ml  Net     98 ml   Filed Weights   11/10/14 2026 11/13/14 0601 11/14/14 1340  Weight: 85.4 kg (188 lb 4.4 oz) 81.4 kg (179 lb 7.3 oz) 86.8 kg (191 lb 5.8 oz)    Exam: No change from previous day  General:  Alert and oriented 3, no acute distress  Cardiovascular: Irregular rhythm, rate controlled  Respiratory: Clear to auscultation bilaterally  Abdomen: Soft, nontender, nondistended, positive bowel sounds  Musculoskeletal: No clubbing or cyanosis or edema   Data Reviewed: Basic Metabolic Panel:  Recent Labs Lab 11/09/14 1055 11/10/14 1700 11/11/14 0437 11/14/14 1438  NA 140 136 137 134*  K 4.4 5.8* 4.5 3.9  CL 100 97 98 96  CO2 28 21 27 25   GLUCOSE 92 141* 99 134*  BUN 30* 42* 18 24*  CREATININE 6.85* 8.35* 5.16* 7.03*  CALCIUM 7.8* 7.7* 7.8* 7.7*  PHOS  --  5.3*  --  3.2   Liver Function Tests:  Recent Labs Lab 11/10/14 1700 11/14/14 1438  ALBUMIN 2.4* 2.0*   No results for input(s): LIPASE, AMYLASE in the last 168 hours. No results for input(s): AMMONIA in the last 168 hours. CBC:  Recent Labs Lab 11/09/14 1055 11/10/14 1700 11/11/14 0437 11/12/14 0602 11/13/14 0058 11/14/14 0318  WBC 7.9 9.2 8.8 8.3 8.2 8.1  NEUTROABS 6.1  --   --   --   --   --   HGB 10.0* 10.2* 10.6* 9.5* 10.0* 10.0*  HCT 33.0* 33.0* 34.6* 31.0* 32.0* 32.1*  MCV 94.0 90.2 90.3 89.1 88.9 89.2  PLT 238 295 243 204 210 195   Cardiac Enzymes:    Recent Labs Lab 11/09/14 1055  TROPONINI <0.03   BNP (last 3 results) No results for input(s): BNP in the last 8760 hours.  ProBNP (last 3 results)  Recent Labs  08/15/14 0245  PROBNP >70000.0*    CBG:  Recent Labs Lab 11/13/14 0637 11/13/14 1631 11/13/14 2153 11/14/14 0611 11/14/14 1150  GLUCAP 99 129* 138* 91 105*    Recent Results (from the past 240 hour(s))  MRSA PCR Screening     Status: Abnormal   Collection Time: 11/09/14  7:07 PM  Result Value Ref Range Status   MRSA by PCR POSITIVE (A) NEGATIVE  Final    Comment:        The GeneXpert MRSA Assay (FDA approved for NASAL specimens only), is one component of a comprehensive MRSA colonization surveillance program. It is not intended to diagnose MRSA infection nor to guide or monitor treatment for MRSA infections. RESULT CALLED TO, READ BACK BY AND VERIFIED WITH: SPRADLING,S RN [id number] 11/09/14 MITCHELL,L   Culture, routine-abscess     Status: None   Collection Time: 11/10/14  2:38 PM  Result Value Ref Range Status   Specimen Description ABSCESS RIGHT PLEURAL  Final   Special Requests NONE  Final   Gram Stain   Final    ABUNDANT WBC PRESENT,BOTH PMN AND MONONUCLEAR NO SQUAMOUS EPITHELIAL CELLS SEEN NO ORGANISMS SEEN Performed at [facility]    Culture   Final    NO GROWTH 3 DAYS Performed at [facility]    Report Status 11/14/2014 FINAL  Final     Studies: No results found.  Scheduled Meds: . allopurinol  100 mg Oral Daily  . amiodarone  200 mg Oral Daily  . Chlorhexidine Gluconate Cloth  6 each Topical Q0600  . cinacalcet  30 mg Oral Q breakfast  . Darbepoetin Alfa  60 mcg Intravenous Q Thu-HD  . ferric gluconate (FERRLECIT/NULECIT) IV  62.5 mg Intravenous Q Thu-1800  . FLUoxetine  20 mg Oral Daily  . insulin aspart  0-5 Units Subcutaneous QHS  . insulin aspart  0-9 Units Subcutaneous TID WC  . insulin glargine  10 Units Subcutaneous QHS  . midodrine  10 mg Oral BID WC  . multivitamin  1 tablet Oral QHS  . mupirocin ointment  1 application Nasal BID  . pantoprazole  40 mg Oral BID  . piperacillin-tazobactam (ZOSYN)  IV  2.25 g Intravenous Q8H  . sevelamer carbonate  800 mg Oral TID WC  . sodium chloride  3 mL Intravenous Q12H  . sodium chloride  3 mL Intravenous Q12H  . traZODone  50 mg Oral QHS  . vancomycin  1,000 mg Intravenous Q T,Th,Sa-HD    Continuous Infusions: . heparin 1,900 Units/hr (11/14/14 1410)     Time spent: 15 min  [facility] Pager  [phone]. If 7PM-7AM, please contact night-coverage at [email or web address], password [email or web address] 11/14/2014, 4:44 PM  LOS: 5 days

## 2014-11-15 LAB — CBC
HEMATOCRIT: 32.1 % — AB (ref 39.0–52.0)
HEMOGLOBIN: 9.8 g/dL — AB (ref 13.0–17.0)
MCH: 27.7 pg (ref 26.0–34.0)
MCHC: 30.5 g/dL (ref 30.0–36.0)
MCV: 90.7 fL (ref 78.0–100.0)
PLATELETS: 200 10*3/uL (ref 150–400)
RBC: 3.54 MIL/uL — ABNORMAL LOW (ref 4.22–5.81)
RDW: 16.4 % — AB (ref 11.5–15.5)
WBC: 8.2 10*3/uL (ref 4.0–10.5)

## 2014-11-15 LAB — GLUCOSE, CAPILLARY
Glucose-Capillary: 116 mg/dL — ABNORMAL HIGH (ref 70–99)
Glucose-Capillary: 151 mg/dL — ABNORMAL HIGH (ref 70–99)
Glucose-Capillary: 90 mg/dL (ref 70–99)
Glucose-Capillary: 119 mg/dL — ABNORMAL HIGH (ref 70–99)

## 2014-11-15 LAB — HEPARIN LEVEL (UNFRACTIONATED): HEPARIN UNFRACTIONATED: 0.59 [IU]/mL (ref 0.30–0.70)

## 2014-11-15 LAB — CLOSTRIDIUM DIFFICILE BY PCR: Toxigenic C. Difficile by PCR: POSITIVE — AB

## 2014-11-15 MED ORDER — DARBEPOETIN ALFA 60 MCG/0.3ML IJ SOSY
100.0000 ug | PREFILLED_SYRINGE | INTRAMUSCULAR | Status: DC
  Administered 2014-11-19: 100 ug via INTRAVENOUS
  Filled 2014-11-15: qty 0.6

## 2014-11-15 NOTE — Progress Notes (Addendum)
      [address]       [place] [address]             [phone]         Procedure(s) (LRB): VIDEO BRONCHOSCOPY (N/A) VIDEO ASSISTED THORACOSCOPY (Right) DRAINAGE OF PLEURAL EFFUSION (Right) PLEURAL BIOPSY (N/A)  Subjective: He has no specific complaints this am.  Objective: Vital signs in last 24 hours: Temp:  [97.5 F (36.4 C)-98.4 F (36.9 C)] 97.5 F (36.4 C) (03/20 0509) Pulse Rate:  [89-112] 110 (03/20 0509) Cardiac Rhythm:  [-] Atrial fibrillation (03/20 0036) Resp:  [16-18] 18 (03/20 0509) BP: (85-121)/(62-89) 90/68 mmHg (03/20 0509) SpO2:  [92 %-96 %] 92 % (03/20 0509) Weight:  [180 lb 8.9 oz (81.9 kg)-191 lb 5.8 oz (86.8 kg)] 180 lb 8.9 oz (81.9 kg) (03/20 0509)     Intake/Output from previous day: 03/19 0701 - 03/20 0700 In: 240 [P.O.:240] Out: 1134 [Urine:2; [place]; Chest Tube:130]   Physical Exam:  Cardiovascular: [facility] Pulmonary: Diminished right base and mostly clear on left; no rales, wheezes, or rhonchi. Abdomen: Soft, non tender, bowel sounds present. Wounds: Dressing is clean and dry.   Chest Tube: to water seal, no air leak  Lab Results: CBC:  Recent Labs  11/14/14 0318 11/15/14 0535  WBC 8.1 8.2  HGB 10.0* 9.8*  HCT 32.1* 32.1*  PLT 195 200   BMET:   Recent Labs  11/14/14 1438  NA 134*  K 3.9  CL 96  CO2 25  GLUCOSE 134*  BUN 24*  CREATININE 7.03*  CALCIUM 7.7*    PT/INR: No results for input(s): LABPROT, INR in the last 72 hours. ABG:  INR: Will add last result for INR, ABG once components are confirmed Will add last 4 CBG results once components are confirmed  Assessment/Plan:  1. CV - History of a fib on chronic anticoagulation. A fib with HR in the 100's this am. On Amiodarone 200 mg daily and Lopressor 25 bid. On heparin drip. Monitor HR as may need to increase Amiodarone. 2.  Pulmonary - Chest tube with 240 cc last 24 hours. No air leak. CXR this am shows persistent, loculated right  pleural effusion and atelectasis.  Will remove chest tube once output decreases.Unknown cause of right pleural effusion but suspect previous bleed/hematoma from months ago.  3. Anemia of chronic disease-H and H down to 9.8 and 32.1 On Epo and Nulecit 4. ESRD-HD T/TH/Sat. Per nephrology 5.ID-On Vanco and Zosyn. Pleural abscess shows no organisms, no growth to date 6.DM-CBGs 105/136/116. On Insulin. Per medicine  ZIMMERMAN,DONIELLE MPA-C 11/15/2014,8:46 AM   Chart reviewed, patient examined, agree with above. Chest drain needs to stay in until minimal drainage.

## 2014-11-15 NOTE — Progress Notes (Signed)
ANTICOAGULATION CONSULT NOTE - Follow Up Consult  Pharmacy Consult for Heparin Indication: atrial fibrillation  Allergies  Allergen Reactions  . Bacitracin Other (See Comments)    unknown  . Norvasc [Amlodipine Besylate] Other (See Comments)    unknown    Patient Measurements: Height: 6\' 4"  (193 cm) Weight: 180 lb 8.9 oz (81.9 kg) IBW/kg (Calculated) : 86.8  Vital Signs: Temp: 97.5 F (36.4 C) (03/20 0509) Temp Source: Oral (03/20 0509) BP: 90/68 mmHg (03/20 0509) Pulse Rate: 110 (03/20 0509)  Labs:  Recent Labs  11/13/14 0058  11/13/14 1951 11/14/14 0318 11/14/14 1438 11/15/14 0535  HGB 10.0*  --   --  10.0*  --  9.8*  HCT 32.0*  --   --  32.1*  --  32.1*  PLT 210  --   --  195  --  200  HEPARINUNFRC  --   < > 0.39 0.49  --  0.59  CREATININE  --   --   --   --  7.03*  --   < > = values in this interval not displayed.  Estimated Creatinine Clearance: 12.1 mL/min (by C-G formula based on Cr of 7.03).   Medications:  Heparin @ 1900 units/hr  Assessment: 63yom continues on heparin for afib while coumadin remains on hold. Chest tube to suction yesterday but still with decent output (240cc/24h) - plan to remove once output decreases. Heparin level is therapeutic. CBC is stable. No bleeding reported.  Goal of Therapy:  Heparin level 0.3-0.7 units/ml Monitor platelets by anticoagulation protocol: Yes     Plan:  1) Continue heparin at 1900 units/hr 2) Follow up heparin level, CBC in AM   [name] 11/15/2014,10:59 AM

## 2014-11-15 NOTE — Progress Notes (Signed)
PROGRESS NOTE  [name] [name] CWC:[id number] DOB: [birth year] DOA: 11/09/2014 PCP: [name], MD  HPI/Recap of past 28 hours: 64 year old male with past medical history of atrial fibrillation, diabetes mellitus and end-stage renal disease on hemodialysis as well as previous MRSA pleural effusion admitted on 3/14 for 3 days of shortness of breath and found to have complex fluid collection in right thorax, transferred to [facility].  Patient seen by cardiothoracic surgery and right sided chest tube placed. This continued to drain since, over 6 L. Patient himself with no complaints. He reported some loose stooling the other day, but none today. By nursing protocol, stool sent for C. difficile which was positive.  Assessment/Plan: Active Problems:   Atrial fibrillation: On chronic anticoagulation. Discussed with vascular surgery and started on IV heparin. Continue amiodarone and beta blocker    ESRD (end stage renal disease) on dialysis: Nephrology following. For hemodialysis tomorrow.    DM type 2 causing ESRD: Blood sugars remained stable, under 150    Anemia of chronic disease: Overall stable staying above 10    Recurrent right pleural effusion: Status post chest tube. Appreciate cardiothoracic surgery help, continue chest tube. Cause is suspected to be secondary to previous bleed several months ago. Chest tube out once effusion off. Follow-up chest x-ray tomorrow  ? C. difficile culture: Suspect this is more C. difficile colonization. Patient had some reports of loose stools on 319, however no fever no white count and he's had no problems today. Would favor holding off on treating at this time   Code Status: Full code  Family Communication: Wife at the bedside  Disposition Plan: Home once chest tube removed   Consultants:  Cardiothoracic surgery  Procedures:  Chest tube placement done 3/15  Antibiotics:  IV vancomycin 3/15-3/20  IV Zosyn  3/15-3/20   Objective: BP 90/68 mmHg  Pulse 110  Temp(Src) 97.5 F (36.4 C) (Oral)  Resp 18  Ht 6\' 4"  (1.93 m)  Wt 81.9 kg (180 lb 8.9 oz)  BMI 21.99 kg/m2  SpO2 92%  Intake/Output Summary (Last 24 hours) at 11/15/14 1742 Last data filed at 11/15/14 0245  Gross per 24 hour  Intake      0 ml  Output   1132 ml  Net  -1132 ml   Filed Weights   11/13/14 0601 11/14/14 1340 11/15/14 0509  Weight: 81.4 kg (179 lb 7.3 oz) 86.8 kg (191 lb 5.8 oz) 81.9 kg (180 lb 8.9 oz)    Exam:   General:  Alert and oriented 3, no acute distress  Cardiovascular: Irregular rhythm, rate controlled  Respiratory: Clear to auscultation bilaterally, chest tube in place  Abdomen: Soft, nontender, nondistended, positive bowel sounds  Musculoskeletal: No clubbing or cyanosis or edema   Data Reviewed: Basic Metabolic Panel:  Recent Labs Lab 11/09/14 1055 11/10/14 1700 11/11/14 0437 11/14/14 1438  NA 140 136 137 134*  K 4.4 5.8* 4.5 3.9  CL 100 97 98 96  CO2 28 21 27 25   GLUCOSE 92 141* 99 134*  BUN 30* 42* 18 24*  CREATININE 6.85* 8.35* 5.16* 7.03*  CALCIUM 7.8* 7.7* 7.8* 7.7*  PHOS  --  5.3*  --  3.2   Liver Function Tests:  Recent Labs Lab 11/10/14 1700 11/14/14 1438  ALBUMIN 2.4* 2.0*   No results for input(s): LIPASE, AMYLASE in the last 168 hours. No results for input(s): AMMONIA in the last 168 hours. CBC:  Recent Labs Lab 11/09/14 1055  11/11/14  5638 11/12/14 0602 11/13/14 0058 11/14/14 0318 11/15/14 0535  WBC 7.9  < > 8.8 8.3 8.2 8.1 8.2  NEUTROABS 6.1  --   --   --   --   --   --   HGB 10.0*  < > 10.6* 9.5* 10.0* 10.0* 9.8*  HCT 33.0*  < > 34.6* 31.0* 32.0* 32.1* 32.1*  MCV 94.0  < > 90.3 89.1 88.9 89.2 90.7  PLT 238  < > 243 204 210 195 200  < > = values in this interval not displayed. Cardiac Enzymes:    Recent Labs Lab 11/09/14 1055  TROPONINI <0.03   BNP (last 3 results) No results for input(s): BNP in the last 8760 hours.  ProBNP (last 3  results)  Recent Labs  08/15/14 0245  PROBNP >70000.0*    CBG:  Recent Labs Lab 11/14/14 1150 11/14/14 2145 11/15/14 0701 11/15/14 1157 11/15/14 1633  GLUCAP 105* 136* 116* 151* 90    Recent Results (from the past 240 hour(s))  MRSA PCR Screening     Status: Abnormal   Collection Time: 11/09/14  7:07 PM  Result Value Ref Range Status   MRSA by PCR POSITIVE (A) NEGATIVE Final    Comment:        The GeneXpert MRSA Assay (FDA approved for NASAL specimens only), is one component of a comprehensive MRSA colonization surveillance program. It is not intended to diagnose MRSA infection nor to guide or monitor treatment for MRSA infections. RESULT CALLED TO, READ BACK BY AND VERIFIED WITH: SPRADLING,S RN [id number] 11/09/14 MITCHELL,L   Culture, routine-abscess     Status: None   Collection Time: 11/10/14  2:38 PM  Result Value Ref Range Status   Specimen Description ABSCESS RIGHT PLEURAL  Final   Special Requests NONE  Final   Gram Stain   Final    ABUNDANT WBC PRESENT,BOTH PMN AND MONONUCLEAR NO SQUAMOUS EPITHELIAL CELLS SEEN NO ORGANISMS SEEN Performed at [facility]    Culture   Final    NO GROWTH 3 DAYS Performed at [facility]    Report Status 11/14/2014 FINAL  Final  Clostridium Difficile by PCR     Status: Abnormal   Collection Time: 11/15/14  6:40 AM  Result Value Ref Range Status   C difficile by pcr POSITIVE (A) NEGATIVE Final    Comment: CRITICAL RESULT CALLED TO, READ BACK BY AND VERIFIED WITH: D.MURPHY,RN 11/15/14 @1158  BY V.WILKINS      Studies: Dg Chest Port 1 View  11/14/2014   CLINICAL DATA:  Loculated right pleural effusion, right chest tube  EXAM: PORTABLE CHEST - 1 VIEW  COMPARISON:  11/12/2014  FINDINGS: Stable right base pigtail chest tube. Stable appearance of the loculated right effusion including an anterior medial component extending along the right paratracheal region and hilum accounting for the chest x-ray abnormality.  Prior coronary bypass and mitral valve replacement. Left lung remains clear. Persistent right lung atelectasis. No enlarging pneumothorax.  IMPRESSION: Stable right base chest tube and loculated residual right pleural effusion.  Underlying right lung atelectasis.  No developing pneumothorax.   Electronically Signed   By: Jerilynn Mages.  Shick M.D.   On: 11/14/2014 10:00    Scheduled Meds: . allopurinol  100 mg Oral Daily  . amiodarone  200 mg Oral Daily  . cinacalcet  30 mg Oral Q breakfast  . [START ON 11/19/2014] Darbepoetin Alfa  100 mcg Intravenous Q Thu-HD  . ferric gluconate (FERRLECIT/NULECIT) IV  62.5 mg Intravenous Q  Thu-1800  . FLUoxetine  20 mg Oral Daily  . insulin aspart  0-5 Units Subcutaneous QHS  . insulin aspart  0-9 Units Subcutaneous TID WC  . insulin glargine  10 Units Subcutaneous QHS  . midodrine  10 mg Oral BID WC  . multivitamin  1 tablet Oral QHS  . mupirocin ointment  1 application Nasal BID  . pantoprazole  40 mg Oral BID  . sevelamer carbonate  800 mg Oral TID WC  . sodium chloride  3 mL Intravenous Q12H  . sodium chloride  3 mL Intravenous Q12H  . traZODone  50 mg Oral QHS    Continuous Infusions: . heparin 1,900 Units/hr (11/15/14 0234)     Time spent: 25 min  [facility] Pager [phone]. If 7PM-7AM, please contact night-coverage at [email or web address], password [facility] 11/15/2014, 5:42 PM  LOS: 6 days

## 2014-11-15 NOTE — Progress Notes (Signed)
Subjective: Interval History: has no complaint, not much appetite, sleepy today.  Objective: Vital signs in last 24 hours: Temp:  [97.5 F (36.4 C)-98.4 F (36.9 C)] 97.5 F (36.4 C) (03/20 0509) Pulse Rate:  [89-112] 110 (03/20 0509) Resp:  [16-18] 18 (03/20 0509) BP: (85-121)/(62-89) 90/68 mmHg (03/20 0509) SpO2:  [92 %-96 %] 92 % (03/20 0509) Weight:  [81.9 kg (180 lb 8.9 oz)-86.8 kg (191 lb 5.8 oz)] 81.9 kg (180 lb 8.9 oz) (03/20 0509) Weight change:   Intake/Output from previous day: 03/19 0701 - 03/20 0700 In: 240 [P.O.:240] Out: 1134 [Urine:2; [place]; Chest Tube:130] Intake/Output this shift:    General appearance: cooperative, no distress and slowed mentation Resp: diminished breath sounds bilaterally, rales RLL and rhonchi RLL Chest wall: R CT Cardio: irregularly irregular rhythm and systolic murmur: holosystolic 2/6, blowing at apex GI: pos bs, liver down 4 cm, soft Extremities: edema none and R AVF B&T, R AKA  Lab Results:  Recent Labs  11/14/14 0318 11/15/14 0535  WBC 8.1 8.2  HGB 10.0* 9.8*  HCT 32.1* 32.1*  PLT 195 200   BMET:  Recent Labs  11/14/14 1438  NA 134*  K 3.9  CL 96  CO2 25  GLUCOSE 134*  BUN 24*  CREATININE 7.03*  CALCIUM 7.7*   No results for input(s): PTH in the last 72 hours. Iron Studies: No results for input(s): IRON, TIBC, TRANSFERRIN, FERRITIN in the last 72 hours.  Studies/Results: Dg Chest Port 1 View  11/14/2014   CLINICAL DATA:  Loculated right pleural effusion, right chest tube  EXAM: PORTABLE CHEST - 1 VIEW  COMPARISON:  11/12/2014  FINDINGS: Stable right base pigtail chest tube. Stable appearance of the loculated right effusion including an anterior medial component extending along the right paratracheal region and hilum accounting for the chest x-ray abnormality. Prior coronary bypass and mitral valve replacement. Left lung remains clear. Persistent right lung atelectasis. No enlarging pneumothorax.  IMPRESSION:  Stable right base chest tube and loculated residual right pleural effusion.  Underlying right lung atelectasis.  No developing pneumothorax.   Electronically Signed   By: Jerilynn Mages.  Shick M.D.   On: 11/14/2014 10:00    I have reviewed the patient's current medications.  Assessment/Plan: 1 ESRD did well on HD. 2 Anemia ^ epo 3 Afib 4 empyema per CTS 5 AB on VANC/ZOSYN ? D/C Zosyn after 10 d 6 HPTH sensipar 7 CAD 8 PVD P CT, ab, HD on TTS sched    LOS: 6 days   Fronia Depass,Ashish L 11/15/2014,8:26 AM

## 2014-11-15 NOTE — Progress Notes (Signed)
Referring Physician(s): Dr Servando Snare  Subjective:  R loculated pleural effusion chest tube drain placed 3/15 Better daily   Allergies: Bacitracin and Norvasc  Medications: Prior to Admission medications   Medication Sig Start Date End Date Taking? Authorizing Provider  albuterol (PROVENTIL) (2.5 MG/3ML) 0.083% nebulizer solution Take 3 mLs (2.5 mg total) by nebulization every 2 (two) hours as needed for wheezing. 02/21/14  Yes Kinnie Feil, MD  allopurinol (ZYLOPRIM) 100 MG tablet Take 100 mg by mouth daily.  05/15/11  Yes Historical Provider, MD  amiodarone (PACERONE) 200 MG tablet Take 1 tablet (200 mg total) by mouth daily. 08/31/14  Yes Satira Sark, MD  B Complex-C-Zn-Folic Acid (DIALYVITE/ZINC PO) Take by mouth 3 (three) times a week. Patient has dialysis on Tuesdays, Thursdays, and Saturdays   Yes Historical Provider, MD  FLUoxetine (PROZAC) 20 MG capsule Take 20 mg by mouth daily. 09/21/14  Yes Historical Provider, MD  folic acid (FOLVITE) 1 MG tablet Take 1 mg by mouth daily.     Yes Historical Provider, MD  insulin glargine (LANTUS) 100 UNIT/ML injection Inject 10 Units into the skin at bedtime.   Yes Historical Provider, MD  metoprolol (LOPRESSOR) 50 MG tablet Take 75 mg by mouth 2 (two) times daily. 07/04/14  Yes Historical Provider, MD  midodrine (PROAMATINE) 10 MG tablet Take 1 tablet (10 mg total) by mouth 2 (two) times daily with a meal. 08/20/14  Yes Silver Huguenin Elgergawy, MD  pantoprazole (PROTONIX) 40 MG tablet Take 40 mg by mouth 2 (two) times daily.  05/25/11  Yes Historical Provider, MD  RENVELA 800 MG tablet Take 800 mg by mouth 3 (three) times daily with meals.  05/03/11  Yes Historical Provider, MD  SENSIPAR 30 MG tablet Take 30 mg by mouth daily. 01/22/14  Yes Historical Provider, MD  traMADol (ULTRAM) 50 MG tablet Take by mouth every 8 (eight) hours as needed for moderate pain.   Yes Historical Provider, MD  traZODone (DESYREL) 50 MG tablet Take 1 tablet (50 mg  total) by mouth at bedtime. 08/13/13  Yes Nita Sells, MD  warfarin (COUMADIN) 1 MG tablet Take 1/2 tablet daily Patient taking differently: Take 1/2 tablet qhs 09/08/14  Yes Satira Sark, MD  HYDROcodone-acetaminophen (NORCO/VICODIN) 5-325 MG per tablet Take 1 tablet by mouth every 6 (six) hours as needed for moderate pain. 08/06/14   Carole Civil, MD     Vital Signs: BP 90/68 mmHg  Pulse 110  Temp(Src) 97.5 F (36.4 C) (Oral)  Resp 18  Ht 6\' 4"  (1.93 m)  Wt 180 lb 8.9 oz (81.9 kg)  BMI 21.99 kg/m2  SpO2 92%  Physical Exam  Pulmonary/Chest: Effort normal and breath sounds normal.  Rt chest tube drain intact Site clean and dry; NT Output thin serosanguinous, 130 mL recorded past 24hr     Imaging: Dg Chest 2 View  11/12/2014   CLINICAL DATA:  Chest tube, essential benign hypertension, type 2 diabetes, coronary artery disease post CABG, atrial fibrillation, end-stage renal disease  EXAM: CHEST  2 VIEW  COMPARISON:  Portable exam of 11/11/2014, chest CT 11/09/2014  FINDINGS: RIGHT basilar pigtail thoracostomy tube unchanged.  Enlargement of cardiac silhouette post MVR and CABG.  Pulmonary vascular congestion.  Persistent atelectasis of RIGHT upper and RIGHT lower lobes.  Persistent loculated RIGHT pleural effusion.  Probable mild pulmonary edema.  No pneumothorax.  Bones demineralized.  IMPRESSION: Enlargement of cardiac silhouette with pulmonary vascular congestion and probable while pulmonary edema.  Persistent loculated RIGHT pleural effusion and RIGHT lung atelectasis.   Electronically Signed   By: Lavonia Dana M.D.   On: 11/12/2014 08:10   Dg Chest Port 1 View  11/14/2014   CLINICAL DATA:  Loculated right pleural effusion, right chest tube  EXAM: PORTABLE CHEST - 1 VIEW  COMPARISON:  11/12/2014  FINDINGS: Stable right base pigtail chest tube. Stable appearance of the loculated right effusion including an anterior medial component extending along the right  paratracheal region and hilum accounting for the chest x-ray abnormality. Prior coronary bypass and mitral valve replacement. Left lung remains clear. Persistent right lung atelectasis. No enlarging pneumothorax.  IMPRESSION: Stable right base chest tube and loculated residual right pleural effusion.  Underlying right lung atelectasis.  No developing pneumothorax.   Electronically Signed   By: Jerilynn Mages.  Shick M.D.   On: 11/14/2014 10:00   Dg Chest Port 1 View  11/11/2014   CLINICAL DATA:  64 year old male with chest pain, pleural effusion. Initial encounter.  EXAM: PORTABLE CHEST - 1 VIEW  COMPARISON:  CT-guided chest tube placement images [id number] and earlier.  FINDINGS: Portable AP semi upright view at 1104 hours. Percutaneous pigtail right pleural drain remains in place with regressed thick walled pleural collection. Lower lung volumes compared to 11/09/2014. Stable cardiac size and mediastinal contours. No new cardiopulmonary abnormality identified.  IMPRESSION: Regressed right lung base pleural collection following percutaneous drainage catheter placement. No new cardiopulmonary abnormality identified.   Electronically Signed   By: Genevie Ann M.D.   On: 11/11/2014 11:54    Labs:  CBC:  Recent Labs  11/12/14 0602 11/13/14 0058 11/14/14 0318 11/15/14 0535  WBC 8.3 8.2 8.1 8.2  HGB 9.5* 10.0* 10.0* 9.8*  HCT 31.0* 32.0* 32.1* 32.1*  PLT 204 210 195 200    COAGS:  Recent Labs  10/08/14 1240 10/08/14 1243 10/19/14 1712 11/09/14 1055  INR 1.8 1.8 1.8 1.31    BMP:  Recent Labs  11/09/14 1055 11/10/14 1700 11/11/14 0437 11/14/14 1438  NA 140 136 137 134*  K 4.4 5.8* 4.5 3.9  CL 100 97 98 96  CO2 28 21 27 25   GLUCOSE 92 141* 99 134*  BUN 30* 42* 18 24*  CALCIUM 7.8* 7.7* 7.8* 7.7*  CREATININE 6.85* 8.35* 5.16* 7.03*  GFRNONAA 8* 6* 11* 7*  GFRAA 9* 7* 12* 8*    LIVER FUNCTION TESTS:  Recent Labs  02/27/14 2022  08/13/14 0403 08/15/14 0245 08/16/14 1616  08/17/14 1022  09/29/14 1257 11/10/14 1700 11/14/14 1438  BILITOT 1.2  --  1.5* 2.0*  --   --   --  1.7*  --   --   AST 19  --  34 34  --   --   --  29  --   --   ALT 10  --  20 24  --   --   --  14  --   --   ALKPHOS 92  --  168* 151*  --   --   --  127*  --   --   PROT 7.0  --  7.1 6.5 6.6  --   --  8.2  --   --   ALBUMIN 2.1*  < > 2.5* 2.1*  --   < > 2.0* 2.3* 2.4* 2.0*  < > = values in this interval not displayed.  Assessment and Plan:  Rt loculated pleural effusion Chest tube placed 3/15 Better Plan per Dr Servando Snare, Output trending down.  SignedAscencion Dike 11/15/2014, 9:36 AM   I spent a total of 15 Minutes in face to face in clinical consultation/evaluation, greater than 50% of which was counseling/coordinating care for Rt chest tube

## 2014-11-16 ENCOUNTER — Inpatient Hospital Stay (HOSPITAL_COMMUNITY): Payer: Medicare Other

## 2014-11-16 LAB — CBC
HCT: 31.4 % — ABNORMAL LOW (ref 39.0–52.0)
HEMOGLOBIN: 9.7 g/dL — AB (ref 13.0–17.0)
MCH: 27.8 pg (ref 26.0–34.0)
MCHC: 30.9 g/dL (ref 30.0–36.0)
MCV: 90 fL (ref 78.0–100.0)
Platelets: 216 10*3/uL (ref 150–400)
RBC: 3.49 MIL/uL — ABNORMAL LOW (ref 4.22–5.81)
RDW: 16.4 % — AB (ref 11.5–15.5)
WBC: 7.9 10*3/uL (ref 4.0–10.5)

## 2014-11-16 LAB — GLUCOSE, CAPILLARY
Glucose-Capillary: 103 mg/dL — ABNORMAL HIGH (ref 70–99)
Glucose-Capillary: 116 mg/dL — ABNORMAL HIGH (ref 70–99)
Glucose-Capillary: 148 mg/dL — ABNORMAL HIGH (ref 70–99)
Glucose-Capillary: 84 mg/dL (ref 70–99)

## 2014-11-16 LAB — HEPARIN LEVEL (UNFRACTIONATED): HEPARIN UNFRACTIONATED: 0.57 [IU]/mL (ref 0.30–0.70)

## 2014-11-16 MED ORDER — CINACALCET HCL 30 MG PO TABS
30.0000 mg | ORAL_TABLET | Freq: Every day | ORAL | Status: DC
  Administered 2014-11-17 – 2014-11-19 (×3): 30 mg via ORAL
  Filled 2014-11-16 (×4): qty 1

## 2014-11-16 MED ORDER — WARFARIN SODIUM 1 MG PO TABS
1.0000 mg | ORAL_TABLET | Freq: Once | ORAL | Status: AC
  Administered 2014-11-16: 1 mg via ORAL
  Filled 2014-11-16: qty 1

## 2014-11-16 MED ORDER — WARFARIN - PHARMACIST DOSING INPATIENT
Freq: Every day | Status: DC
  Administered 2014-11-16 – 2014-11-17 (×2): 1

## 2014-11-16 NOTE — Progress Notes (Addendum)
[address]       [place] [address]             [phone]        Procedure(s) (LRB): VIDEO BRONCHOSCOPY (N/A) VIDEO ASSISTED THORACOSCOPY (Right) DRAINAGE OF PLEURAL EFFUSION (Right) PLEURAL BIOPSY (N/A) Subjective: breating is fairly comfortable, appears to have small air leak with cough. 40 ml out of CT yesterday, 70 so far today  Objective: Vital signs in last 24 hours: Temp:  [98 F (36.7 C)-98.1 F (36.7 C)] 98.1 F (36.7 C) (03/21 0557) Pulse Rate:  [52-65] 65 (03/21 0557) Cardiac Rhythm:  [-] Atrial fibrillation (03/20 0847) Resp:  [18] 18 (03/21 0557) BP: (87-114)/(59-66) 114/66 mmHg (03/21 0557) SpO2:  [100 %] 100 % (03/21 0557) Weight:  [182 lb 8.7 oz (82.8 kg)] 182 lb 8.7 oz (82.8 kg) (03/21 0557)  Hemodynamic parameters for last 24 hours:    Intake/Output from previous day: 03/20 0701 - 03/21 0700 In: -  Out: 112 [Urine:1; Stool:1; Chest Tube:110] Intake/Output this shift:    General appearance: alert, cooperative and no distress Heart: irregularly irregular rhythm and tachy Lungs: dim in right lower fields  Lab Results:  Recent Labs  11/15/14 0535 11/16/14 0525  WBC 8.2 7.9  HGB 9.8* 9.7*  HCT 32.1* 31.4*  PLT 200 216   BMET:  Recent Labs  11/14/14 1438  NA 134*  K 3.9  CL 96  CO2 25  GLUCOSE 134*  BUN 24*  CREATININE 7.03*  CALCIUM 7.7*    PT/INR: No results for input(s): LABPROT, INR in the last 72 hours. ABG    Component Value Date/Time   PHART 7.406 02/13/2014 2235   HCO3 22.4 02/13/2014 2235   TCO2 20.4 02/13/2014 2235   ACIDBASEDEF 1.6 02/13/2014 2235   O2SAT 89.9 02/13/2014 2235   CBG (last 3)   Recent Labs  11/15/14 1633 11/15/14 2125 11/16/14 0645  GLUCAP 90 119* 103*    Meds Scheduled Meds: . allopurinol  100 mg Oral Daily  . amiodarone  200 mg Oral Daily  . cinacalcet  30 mg Oral Q breakfast  . [START ON 11/19/2014] Darbepoetin Alfa  100 mcg Intravenous Q Thu-HD  . ferric  gluconate (FERRLECIT/NULECIT) IV  62.5 mg Intravenous Q Thu-1800  . FLUoxetine  20 mg Oral Daily  . insulin aspart  0-5 Units Subcutaneous QHS  . insulin aspart  0-9 Units Subcutaneous TID WC  . insulin glargine  10 Units Subcutaneous QHS  . midodrine  10 mg Oral BID WC  . multivitamin  1 tablet Oral QHS  . pantoprazole  40 mg Oral BID  . sevelamer carbonate  800 mg Oral TID WC  . sodium chloride  3 mL Intravenous Q12H  . sodium chloride  3 mL Intravenous Q12H  . traZODone  50 mg Oral QHS   Continuous Infusions: . heparin 1,900 Units/hr (11/15/14 0234)   PRN Meds:.albuterol, ondansetron **OR** ondansetron (ZOFRAN) IV, traMADol  Xrays Dg Chest Port 1 View  11/16/2014   CLINICAL DATA:  Pleural effusion.  Chest tube.  EXAM: PORTABLE CHEST - 1 VIEW  COMPARISON:  11/14/2014.  CT 11/09/2014.  FINDINGS: Right chest tube in stable position. Prior CABG and mitral valve replacement. Stable cardiomegaly. No pulmonary venous congestion. Right base and right anterior medial pleural fluid collections are stable. Right base pleural fluid collection is improved significantly from prior CT of 11/09/2014 . Anterior medial fluid collection remains unchanged. No focal infiltrate or pneumothorax.  IMPRESSION: 1. Right chest tube in stable position. Right base and right anterior medial pleural fluid collections are stable. The right base pleural fluid collection has improved significantly from 11/09/2014. The right anterior medial pleural fluid collection is unchanged. 2. Prior CABG and mitral valve replacement. Stable cardiomegaly. No pulmonary venous congestion.   Electronically Signed   By: Marcello Moores  Register   On: 11/16/2014 07:20   Dg Chest Port 1 View  11/14/2014   CLINICAL DATA:  Loculated right pleural effusion, right chest tube  EXAM: PORTABLE CHEST - 1 VIEW  COMPARISON:  11/12/2014  FINDINGS: Stable right base pigtail chest tube. Stable appearance of the loculated right effusion including an anterior medial  component extending along the right paratracheal region and hilum accounting for the chest x-ray abnormality. Prior coronary bypass and mitral valve replacement. Left lung remains clear. Persistent right lung atelectasis. No enlarging pneumothorax.  IMPRESSION: Stable right base chest tube and loculated residual right pleural effusion.  Underlying right lung atelectasis.  No developing pneumothorax.   Electronically Signed   By: Jerilynn Mages.  Shick M.D.   On: 11/14/2014 10:00    Assessment/Plan: S/P Procedure(s) (LRB): VIDEO BRONCHOSCOPY (N/A) VIDEO ASSISTED THORACOSCOPY (Right) DRAINAGE OF PLEURAL EFFUSION (Right) PLEURAL BIOPSY (N/A)  1 improving, keep chest drain in place 2 no leukocytosis or fevers 3 medical management as per primary service   LOS: 7 days    GOLD,WAYNE E 11/16/2014  Chest tube output decreasing, leave one more day I have seen and examined Marice Potter and agree with the above assessment  and plan.  Grace Isaac MD Beeper [phone] Office [phone] 11/16/2014 5:40 PM

## 2014-11-16 NOTE — Progress Notes (Signed)
Referring Physician(s): Dr Servando Snare  Subjective:  R loculated pleural effusion chest tube drain placed 3/15 No specific complaints   Allergies: Bacitracin and Norvasc  Medications: Prior to Admission medications   Medication Sig Start Date End Date Taking? Authorizing Provider  albuterol (PROVENTIL) (2.5 MG/3ML) 0.083% nebulizer solution Take 3 mLs (2.5 mg total) by nebulization every 2 (two) hours as needed for wheezing. 02/21/14  Yes Kinnie Feil, MD  allopurinol (ZYLOPRIM) 100 MG tablet Take 100 mg by mouth daily.  05/15/11  Yes Historical Provider, MD  amiodarone (PACERONE) 200 MG tablet Take 1 tablet (200 mg total) by mouth daily. 08/31/14  Yes Satira Sark, MD  B Complex-C-Zn-Folic Acid (DIALYVITE/ZINC PO) Take by mouth 3 (three) times a week. Patient has dialysis on Tuesdays, Thursdays, and Saturdays   Yes Historical Provider, MD  FLUoxetine (PROZAC) 20 MG capsule Take 20 mg by mouth daily. 09/21/14  Yes Historical Provider, MD  folic acid (FOLVITE) 1 MG tablet Take 1 mg by mouth daily.     Yes Historical Provider, MD  insulin glargine (LANTUS) 100 UNIT/ML injection Inject 10 Units into the skin at bedtime.   Yes Historical Provider, MD  metoprolol (LOPRESSOR) 50 MG tablet Take 75 mg by mouth 2 (two) times daily. 07/04/14  Yes Historical Provider, MD  midodrine (PROAMATINE) 10 MG tablet Take 1 tablet (10 mg total) by mouth 2 (two) times daily with a meal. 08/20/14  Yes Silver Huguenin Elgergawy, MD  pantoprazole (PROTONIX) 40 MG tablet Take 40 mg by mouth 2 (two) times daily.  05/25/11  Yes Historical Provider, MD  RENVELA 800 MG tablet Take 800 mg by mouth 3 (three) times daily with meals.  05/03/11  Yes Historical Provider, MD  SENSIPAR 30 MG tablet Take 30 mg by mouth daily. 01/22/14  Yes Historical Provider, MD  traMADol (ULTRAM) 50 MG tablet Take by mouth every 8 (eight) hours as needed for moderate pain.   Yes Historical Provider, MD  traZODone (DESYREL) 50 MG tablet Take 1  tablet (50 mg total) by mouth at bedtime. 08/13/13  Yes Nita Sells, MD  warfarin (COUMADIN) 1 MG tablet Take 1/2 tablet daily Patient taking differently: Take 1/2 tablet qhs 09/08/14  Yes Satira Sark, MD  HYDROcodone-acetaminophen (NORCO/VICODIN) 5-325 MG per tablet Take 1 tablet by mouth every 6 (six) hours as needed for moderate pain. 08/06/14   Carole Civil, MD     Vital Signs: BP 114/66 mmHg  Pulse 65  Temp(Src) 98.1 F (36.7 C) (Oral)  Resp 18  Ht 6\' 4"  (1.93 m)  Wt 182 lb 8.7 oz (82.8 kg)  BMI 22.23 kg/m2  SpO2 100%  Physical Exam  Pulmonary/Chest: Effort normal and breath sounds normal.  Rt chest tube drain intact Site clean and dry; NT Output thin serosanguinous, 110 mL recorded past 24hr     Imaging: Dg Chest Port 1 View  11/16/2014   CLINICAL DATA:  Pleural effusion.  Chest tube.  EXAM: PORTABLE CHEST - 1 VIEW  COMPARISON:  11/14/2014.  CT 11/09/2014.  FINDINGS: Right chest tube in stable position. Prior CABG and mitral valve replacement. Stable cardiomegaly. No pulmonary venous congestion. Right base and right anterior medial pleural fluid collections are stable. Right base pleural fluid collection is improved significantly from prior CT of 11/09/2014 . Anterior medial fluid collection remains unchanged. No focal infiltrate or pneumothorax.  IMPRESSION: 1. Right chest tube in stable position. Right base and right anterior medial pleural fluid collections are stable. The  right base pleural fluid collection has improved significantly from 11/09/2014. The right anterior medial pleural fluid collection is unchanged. 2. Prior CABG and mitral valve replacement. Stable cardiomegaly. No pulmonary venous congestion.   Electronically Signed   By: Marcello Moores  Register   On: 11/16/2014 07:20   Dg Chest Port 1 View  11/14/2014   CLINICAL DATA:  Loculated right pleural effusion, right chest tube  EXAM: PORTABLE CHEST - 1 VIEW  COMPARISON:  11/12/2014  FINDINGS: Stable  right base pigtail chest tube. Stable appearance of the loculated right effusion including an anterior medial component extending along the right paratracheal region and hilum accounting for the chest x-ray abnormality. Prior coronary bypass and mitral valve replacement. Left lung remains clear. Persistent right lung atelectasis. No enlarging pneumothorax.  IMPRESSION: Stable right base chest tube and loculated residual right pleural effusion.  Underlying right lung atelectasis.  No developing pneumothorax.   Electronically Signed   By: Jerilynn Mages.  Shick M.D.   On: 11/14/2014 10:00    Labs:  CBC:  Recent Labs  11/13/14 0058 11/14/14 0318 11/15/14 0535 11/16/14 0525  WBC 8.2 8.1 8.2 7.9  HGB 10.0* 10.0* 9.8* 9.7*  HCT 32.0* 32.1* 32.1* 31.4*  PLT 210 195 200 216    COAGS:  Recent Labs  10/08/14 1240 10/08/14 1243 10/19/14 1712 11/09/14 1055  INR 1.8 1.8 1.8 1.31    BMP:  Recent Labs  11/09/14 1055 11/10/14 1700 11/11/14 0437 11/14/14 1438  NA 140 136 137 134*  K 4.4 5.8* 4.5 3.9  CL 100 97 98 96  CO2 28 21 27 25   GLUCOSE 92 141* 99 134*  BUN 30* 42* 18 24*  CALCIUM 7.8* 7.7* 7.8* 7.7*  CREATININE 6.85* 8.35* 5.16* 7.03*  GFRNONAA 8* 6* 11* 7*  GFRAA 9* 7* 12* 8*    LIVER FUNCTION TESTS:  Recent Labs  02/27/14 2022  08/13/14 0403 08/15/14 0245 08/16/14 1616  08/17/14 1022 09/29/14 1257 11/10/14 1700 11/14/14 1438  BILITOT 1.2  --  1.5* 2.0*  --   --   --  1.7*  --   --   AST 19  --  34 34  --   --   --  29  --   --   ALT 10  --  20 24  --   --   --  14  --   --   ALKPHOS 92  --  168* 151*  --   --   --  127*  --   --   PROT 7.0  --  7.1 6.5 6.6  --   --  8.2  --   --   ALBUMIN 2.1*  < > 2.5* 2.1*  --   < > 2.0* 2.3* 2.4* 2.0*  < > = values in this interval not displayed.  Assessment and Plan:  Rt loculated pleural effusion Chest tube placed 3/15 Output trending down.  SignedAscencion Dike 11/16/2014, 12:42 PM   I spent a total of 15 Minutes in  face to face in clinical consultation/evaluation, greater than 50% of which was counseling/coordinating care for Rt chest tube

## 2014-11-16 NOTE — Progress Notes (Signed)
[facility] ROUNDING NOTE   Subjective:   Interval History: Chest tube still in place no dyspnea or complaints this am  Objective:  Vital signs in last 24 hours:  Temp:  [98 F (36.7 C)-98.1 F (36.7 C)] 98.1 F (36.7 C) (03/21 0557) Pulse Rate:  [52-65] 65 (03/21 0557) Resp:  [18] 18 (03/21 0557) BP: (87-114)/(59-66) 114/66 mmHg (03/21 0557) SpO2:  [100 %] 100 % (03/21 0557) Weight:  [82.8 kg (182 lb 8.7 oz)] 82.8 kg (182 lb 8.7 oz) (03/21 0557)  Weight change: -4 kg (-8 lb 13.1 oz) Filed Weights   11/14/14 1340 11/15/14 0509 11/16/14 0557  Weight: 86.8 kg (191 lb 5.8 oz) 81.9 kg (180 lb 8.9 oz) 82.8 kg (182 lb 8.7 oz)    Intake/Output: I/O last 3 completed shifts: In: -  Out: 244 [Urine:2; Stool:2; Chest Tube:240]   Intake/Output this shift:     CVS-  Irregular rate RS- diminished breath sounds bilaterally, rales RLL and rhonchi RLL ABD- BS present soft non-distended EXT- no edema R  AVF with thrill  R AKA   Basic Metabolic Panel:  Recent Labs Lab 11/09/14 1055 11/10/14 1700 11/11/14 0437 11/14/14 1438  NA 140 136 137 134*  K 4.4 5.8* 4.5 3.9  CL 100 97 98 96  CO2 28 21 27 25   GLUCOSE 92 141* 99 134*  BUN 30* 42* 18 24*  CREATININE 6.85* 8.35* 5.16* 7.03*  CALCIUM 7.8* 7.7* 7.8* 7.7*  PHOS  --  5.3*  --  3.2    Liver Function Tests:  Recent Labs Lab 11/10/14 1700 11/14/14 1438  ALBUMIN 2.4* 2.0*   No results for input(s): LIPASE, AMYLASE in the last 168 hours. No results for input(s): AMMONIA in the last 168 hours.  CBC:  Recent Labs Lab 11/09/14 1055  11/12/14 0602 11/13/14 0058 11/14/14 0318 11/15/14 0535 11/16/14 0525  WBC 7.9  < > 8.3 8.2 8.1 8.2 7.9  NEUTROABS 6.1  --   --   --   --   --   --   HGB 10.0*  < > 9.5* 10.0* 10.0* 9.8* 9.7*  HCT 33.0*  < > 31.0* 32.0* 32.1* 32.1* 31.4*  MCV 94.0  < > 89.1 88.9 89.2 90.7 90.0  PLT 238  < > 204 210 195 200 216  < > = values in this interval not displayed.  Cardiac  Enzymes:  Recent Labs Lab 11/09/14 1055  TROPONINI <0.03    BNP: Invalid input(s): POCBNP  CBG:  Recent Labs Lab 11/15/14 0701 11/15/14 1157 11/15/14 1633 11/15/14 2125 11/16/14 0645  GLUCAP 116* 151* 46 119* 103*    Microbiology: Results for orders placed or performed during the hospital encounter of 11/09/14  MRSA PCR Screening     Status: Abnormal   Collection Time: 11/09/14  7:07 PM  Result Value Ref Range Status   MRSA by PCR POSITIVE (A) NEGATIVE Final    Comment:        The GeneXpert MRSA Assay (FDA approved for NASAL specimens only), is one component of a comprehensive MRSA colonization surveillance program. It is not intended to diagnose MRSA infection nor to guide or monitor treatment for MRSA infections. RESULT CALLED TO, READ BACK BY AND VERIFIED WITH: SPRADLING,S RN 2243 11/09/14 MITCHELL,L   Culture, routine-abscess     Status: None   Collection Time: 11/10/14  2:38 PM  Result Value Ref Range Status   Specimen Description ABSCESS RIGHT PLEURAL  Final   Special Requests NONE  Final   Gram Stain   Final    ABUNDANT WBC PRESENT,BOTH PMN AND MONONUCLEAR NO SQUAMOUS EPITHELIAL CELLS SEEN NO ORGANISMS SEEN Performed at [facility]    Culture   Final    NO GROWTH 3 DAYS Performed at [facility]    Report Status 11/14/2014 FINAL  Final  Clostridium Difficile by PCR     Status: Abnormal   Collection Time: 11/15/14  6:40 AM  Result Value Ref Range Status   C difficile by pcr POSITIVE (A) NEGATIVE Final    Comment: CRITICAL RESULT CALLED TO, READ BACK BY AND VERIFIED WITH: D.MURPHY,RN 11/15/14 @1158  BY V.WILKINS     Coagulation Studies: No results for input(s): LABPROT, INR in the last 72 hours.  Urinalysis: No results for input(s): COLORURINE, LABSPEC, PHURINE, GLUCOSEU, HGBUR, BILIRUBINUR, KETONESUR, PROTEINUR, UROBILINOGEN, NITRITE, LEUKOCYTESUR in the last 72 hours.  Invalid input(s): APPERANCEUR    Imaging: Dg Chest  Port 1 View  11/16/2014   CLINICAL DATA:  Pleural effusion.  Chest tube.  EXAM: PORTABLE CHEST - 1 VIEW  COMPARISON:  11/14/2014.  CT 11/09/2014.  FINDINGS: Right chest tube in stable position. Prior CABG and mitral valve replacement. Stable cardiomegaly. No pulmonary venous congestion. Right base and right anterior medial pleural fluid collections are stable. Right base pleural fluid collection is improved significantly from prior CT of 11/09/2014 . Anterior medial fluid collection remains unchanged. No focal infiltrate or pneumothorax.  IMPRESSION: 1. Right chest tube in stable position. Right base and right anterior medial pleural fluid collections are stable. The right base pleural fluid collection has improved significantly from 11/09/2014. The right anterior medial pleural fluid collection is unchanged. 2. Prior CABG and mitral valve replacement. Stable cardiomegaly. No pulmonary venous congestion.   Electronically Signed   By: Marcello Moores  Register   On: 11/16/2014 07:20     Medications:   . heparin 1,900 Units/hr (11/15/14 0234)   . allopurinol  100 mg Oral Daily  . amiodarone  200 mg Oral Daily  . [START ON 11/19/2014] Darbepoetin Alfa  100 mcg Intravenous Q Thu-HD  . ferric gluconate (FERRLECIT/NULECIT) IV  62.5 mg Intravenous Q Thu-1800  . FLUoxetine  20 mg Oral Daily  . insulin aspart  0-5 Units Subcutaneous QHS  . insulin aspart  0-9 Units Subcutaneous TID WC  . insulin glargine  10 Units Subcutaneous QHS  . midodrine  10 mg Oral BID WC  . multivitamin  1 tablet Oral QHS  . pantoprazole  40 mg Oral BID  . sevelamer carbonate  800 mg Oral TID WC  . sodium chloride  3 mL Intravenous Q12H  . sodium chloride  3 mL Intravenous Q12H  . 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

## 2014-11-16 NOTE — Progress Notes (Signed)
PROGRESS NOTE  [name] [name] JME:[id number] DOB: [birth year] DOA: 11/09/2014 PCP: [name], MD  HPI/Recap of past 48 hours: 64 year old male with past medical history of atrial fibrillation, diabetes mellitus and end-stage renal disease on hemodialysis as well as previous MRSA pleural effusion admitted on 3/14 for 3 days of shortness of breath and found to have complex fluid collection in right thorax, transferred to [facility].  Patient seen by cardiothoracic surgery and right sided chest tube placed. This continued to drain since, over 6 L. Patient himself with no complaints. He reported some loose stooling on 3/19, but nothing since By nursing protocol, stool sent for C. difficile which was positive.  Patient with no complaints.  Assessment/Plan: Active Problems:   Atrial fibrillation: On chronic anticoagulation. Discussed with vascular surgery and started on IV heparin. Continue amiodarone and beta blocker. Have resumed Coumadin today    ESRD (end stage renal disease) on dialysis: Nephrology following. For hemodialysis tomorrow.    DM type 2 causing ESRD: Blood sugars remained stable, under 150    Anemia of chronic disease: Overall stable staying above 10    Recurrent right pleural effusion: Status post chest tube. Appreciate cardiothoracic surgery help, continue chest tube. Cause is suspected to be secondary to previous bleed several months ago. Follow-up chest x-ray today has much improvement, output decreasing. Chest surgery plans to remove tube possibly tomorrow  ? C. difficile culture: Suspect this is more C. difficile colonization. Patient had some reports of loose stools on 3/19, however no fever no white count and he's had no problems today. Would favor holding off on treating at this time   Code Status: Full code  Family Communication: Wife at the bedside  Disposition Plan: Home once chest tube removed   Consultants:  Cardiothoracic  surgery  Procedures:  Chest tube placement done 3/15  Antibiotics:  IV vancomycin 3/15-3/20  IV Zosyn 3/15-3/20   Objective: BP 114/66 mmHg  Pulse 65  Temp(Src) 98.1 F (36.7 C) (Oral)  Resp 18  Ht 6\' 4"  (1.93 m)  Wt 82.8 kg (182 lb 8.7 oz)  BMI 22.23 kg/m2  SpO2 100%  Intake/Output Summary (Last 24 hours) at 11/16/14 1830 Last data filed at 11/16/14 0947  Gross per 24 hour  Intake    240 ml  Output     72 ml  Net    168 ml   Filed Weights   11/14/14 1340 11/15/14 0509 11/16/14 0557  Weight: 86.8 kg (191 lb 5.8 oz) 81.9 kg (180 lb 8.9 oz) 82.8 kg (182 lb 8.7 oz)    Exam: Unchanged from previous day  General:  Alert and oriented 3, no acute distress  Cardiovascular: Irregular rhythm, rate controlled  Respiratory: Clear to auscultation bilaterally, chest tube in place  Abdomen: Soft, nontender, nondistended, positive bowel sounds  Musculoskeletal: No clubbing or cyanosis or edema   Data Reviewed: Basic Metabolic Panel:  Recent Labs Lab 11/10/14 1700 11/11/14 0437 11/14/14 1438  NA 136 137 134*  K 5.8* 4.5 3.9  CL 97 98 96  CO2 21 27 25   GLUCOSE 141* 99 134*  BUN 42* 18 24*  CREATININE 8.35* 5.16* 7.03*  CALCIUM 7.7* 7.8* 7.7*  PHOS 5.3*  --  3.2   Liver Function Tests:  Recent Labs Lab 11/10/14 1700 11/14/14 1438  ALBUMIN 2.4* 2.0*   No results for input(s): LIPASE, AMYLASE in the last 168 hours. No results for input(s): AMMONIA in the last 168 hours. CBC:  Recent Labs Lab 11/12/14 0602 11/13/14 0058 11/14/14 0318 11/15/14 0535 11/16/14 0525  WBC 8.3 8.2 8.1 8.2 7.9  HGB 9.5* 10.0* 10.0* 9.8* 9.7*  HCT 31.0* 32.0* 32.1* 32.1* 31.4*  MCV 89.1 88.9 89.2 90.7 90.0  PLT 204 210 195 200 216   Cardiac Enzymes:   No results for input(s): CKTOTAL, CKMB, CKMBINDEX, TROPONINI in the last 168 hours. BNP (last 3 results) No results for input(s): BNP in the last 8760 hours.  ProBNP (last 3 results)  Recent Labs  08/15/14 0245    PROBNP >70000.0*    CBG:  Recent Labs Lab 11/15/14 1633 11/15/14 2125 11/16/14 0645 11/16/14 1119 11/16/14 1713  GLUCAP 90 119* 103* 116* 148*    Recent Results (from the past 240 hour(s))  MRSA PCR Screening     Status: Abnormal   Collection Time: 11/09/14  7:07 PM  Result Value Ref Range Status   MRSA by PCR POSITIVE (A) NEGATIVE Final    Comment:        The GeneXpert MRSA Assay (FDA approved for NASAL specimens only), is one component of a comprehensive MRSA colonization surveillance program. It is not intended to diagnose MRSA infection nor to guide or monitor treatment for MRSA infections. RESULT CALLED TO, READ BACK BY AND VERIFIED WITH: SPRADLING,S RN 2243 11/09/14 MITCHELL,L   Culture, routine-abscess     Status: None   Collection Time: 11/10/14  2:38 PM  Result Value Ref Range Status   Specimen Description ABSCESS RIGHT PLEURAL  Final   Special Requests NONE  Final   Gram Stain   Final    ABUNDANT WBC PRESENT,BOTH PMN AND MONONUCLEAR NO SQUAMOUS EPITHELIAL CELLS SEEN NO ORGANISMS SEEN Performed at [facility]    Culture   Final    NO GROWTH 3 DAYS Performed at [facility]    Report Status 11/14/2014 FINAL  Final  Clostridium Difficile by PCR     Status: Abnormal   Collection Time: 11/15/14  6:40 AM  Result Value Ref Range Status   C difficile by pcr POSITIVE (A) NEGATIVE Final    Comment: CRITICAL RESULT CALLED TO, READ BACK BY AND VERIFIED WITH: D.MURPHY,RN 11/15/14 @1158  BY V.WILKINS      Studies: No results found.  Scheduled Meds: . allopurinol  100 mg Oral Daily  . amiodarone  200 mg Oral Daily  . [START ON 11/17/2014] cinacalcet  30 mg Oral Q breakfast  . [START ON 11/19/2014] Darbepoetin Alfa  100 mcg Intravenous Q Thu-HD  . ferric gluconate (FERRLECIT/NULECIT) IV  62.5 mg Intravenous Q Thu-1800  . FLUoxetine  20 mg Oral Daily  . insulin aspart  0-5 Units Subcutaneous QHS  . insulin aspart  0-9 Units Subcutaneous TID  WC  . insulin glargine  10 Units Subcutaneous QHS  . midodrine  10 mg Oral BID WC  . multivitamin  1 tablet Oral QHS  . pantoprazole  40 mg Oral BID  . sevelamer carbonate  800 mg Oral TID WC  . sodium chloride  3 mL Intravenous Q12H  . sodium chloride  3 mL Intravenous Q12H  . traZODone  50 mg Oral QHS  . Warfarin - Pharmacist Dosing Inpatient   Does not apply q1800    Continuous Infusions: . heparin 1,900 Units/hr (11/15/14 0234)     Time spent: 15 min  [facility] Pager [phone]. If 7PM-7AM, please contact night-coverage at [email or web address], password [facility] 11/16/2014, 6:30 PM  LOS: 7 days

## 2014-11-16 NOTE — Discharge Instructions (Signed)

## 2014-11-16 NOTE — Progress Notes (Signed)
ANTICOAGULATION CONSULT NOTE - Initial Consult  Pharmacy Consult for heparin/warfarin  Indication: atrial fibrillation  Allergies  Allergen Reactions  . Bacitracin Other (See Comments)    unknown  . Norvasc [Amlodipine Besylate] Other (See Comments)    unknown    Patient Measurements: Height: 6\' 4"  (193 cm) Weight: 182 lb 8.7 oz (82.8 kg) IBW/kg (Calculated) : 86.8 Heparin Dosing Weight: 83 kg  Vital Signs: Temp: 98.1 F (36.7 C) (03/21 0557) Temp Source: Oral (03/21 0557) BP: 114/66 mmHg (03/21 0557) Pulse Rate: 65 (03/21 0557)  Labs:  Recent Labs  11/14/14 0318 11/14/14 1438 11/15/14 0535 11/16/14 0525  HGB 10.0*  --  9.8* 9.7*  HCT 32.1*  --  32.1* 31.4*  PLT 195  --  200 216  HEPARINUNFRC 0.49  --  0.59 0.57  CREATININE  --  7.03*  --   --     Estimated Creatinine Clearance: 12.3 mL/min (by C-G formula based on Cr of 7.03).   Medical History: Past Medical History  Diagnosis Date  . UGI bleed 02/12/11    On anticoagulation; EGD w/snare polypectomy-multiple polypoid lesions antrum(benign), Chronic active gastritis (NEGATIVE H pylori)  . Anemia, chronic disease   . Sepsis due to enterococcus 02/11/11  . Atrial fibrillation     On coumadin  . Coronary atherosclerosis of native coronary artery     Multivessel status post CABG  . Cardiomyopathy   . Type 2 diabetes mellitus   . Essential hypertension, benign   . Hyperlipemia   . Gout   . S/P patent foramen ovale closure   . Hyperbilirubinemia 08/10/2013  . SBO (small bowel obstruction) 01/2014  . 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 Clostridium difficile colitis 01/2014  . Diverticulosis   . Internal hemorrhoids   . ESRD on hemodialysis     Started 2008-2009. Dr Hinda Lenis on a TTS schedule - R forearm AVF for several years      Medications:  Infusions:  . heparin 1,900 Units/hr (11/15/14 0234)    Assessment: Pt on warfarin PTA for hx of AFib.  PTA warfarin dose: 0.5 mg Tue/Thurs/Sat, 1 mg on all  other days.  Continues on heparin gtt while inpatient. HL therapeutic, h/h 9.7/31, plts wnl, plan to resume warfarin tonight.    Goal of Therapy:  INR 2-3 Heparin level 0.3-0.7 units/ml Monitor platelets by anticoagulation protocol: Yes   Plan:  -Heparin at 1900 units/hr -Warfarin 1 mg po x1 -Daily INR, HL, CBC -Monitor for s/sx bleeding   Hughes Better, PharmD, BCPS Clinical Pharmacist Pager: [phone] 11/16/2014 11:57 AM

## 2014-11-16 NOTE — Progress Notes (Signed)
Medicare Important Message given? YES  (If response is "NO", the following Medicare IM given date fields will be blank)  Date Medicare IM given: 11/16/14 Medicare IM given by:  Mykiah Schmuck  

## 2014-11-17 LAB — GLUCOSE, CAPILLARY
Glucose-Capillary: 121 mg/dL — ABNORMAL HIGH (ref 70–99)
Glucose-Capillary: 115 mg/dL — ABNORMAL HIGH (ref 70–99)
Glucose-Capillary: 143 mg/dL — ABNORMAL HIGH (ref 70–99)

## 2014-11-17 LAB — CBC
HEMATOCRIT: 30.3 % — AB (ref 39.0–52.0)
HEMOGLOBIN: 9.5 g/dL — AB (ref 13.0–17.0)
MCH: 27.7 pg (ref 26.0–34.0)
MCHC: 31.4 g/dL (ref 30.0–36.0)
MCV: 88.3 fL (ref 78.0–100.0)
Platelets: 202 10*3/uL (ref 150–400)
RBC: 3.43 MIL/uL — ABNORMAL LOW (ref 4.22–5.81)
RDW: 16.2 % — AB (ref 11.5–15.5)
WBC: 7.9 10*3/uL (ref 4.0–10.5)

## 2014-11-17 LAB — PROTIME-INR
INR: 1.46 (ref 0.00–1.49)
Prothrombin Time: 17.9 seconds — ABNORMAL HIGH (ref 11.6–15.2)

## 2014-11-17 LAB — HEPARIN LEVEL (UNFRACTIONATED): HEPARIN UNFRACTIONATED: 0.61 [IU]/mL (ref 0.30–0.70)

## 2014-11-17 MED ORDER — PENTAFLUOROPROP-TETRAFLUOROETH EX AERO: 1.0000 "application " | INHALATION_SPRAY | CUTANEOUS | Status: DC | PRN

## 2014-11-17 MED ORDER — SODIUM CHLORIDE 0.9 % IV SOLN: 100.0000 mL | INTRAVENOUS | Status: DC | PRN

## 2014-11-17 MED ORDER — LIDOCAINE-PRILOCAINE 2.5-2.5 % EX CREA: 1.0000 "application " | TOPICAL_CREAM | CUTANEOUS | Status: DC | PRN

## 2014-11-17 MED ORDER — ALTEPLASE 2 MG IJ SOLR
2.0000 mg | Freq: Once | INTRAMUSCULAR | Status: DC | PRN
  Filled 2014-11-17: qty 2

## 2014-11-17 MED ORDER — NEPRO/CARBSTEADY PO LIQD
237.0000 mL | ORAL | Status: DC | PRN
  Filled 2014-11-17: qty 237

## 2014-11-17 MED ORDER — LIDOCAINE HCL (PF) 1 % IJ SOLN: 5.0000 mL | INTRAMUSCULAR | Status: DC | PRN

## 2014-11-17 MED ORDER — HEPARIN SODIUM (PORCINE) 1000 UNIT/ML DIALYSIS
1000.0000 [IU] | INTRAMUSCULAR | Status: DC | PRN
  Filled 2014-11-17: qty 1

## 2014-11-17 MED ORDER — WARFARIN SODIUM 1 MG PO TABS
1.0000 mg | ORAL_TABLET | Freq: Once | ORAL | Status: AC
  Administered 2014-11-17: 1 mg via ORAL
  Filled 2014-11-17: qty 1

## 2014-11-17 MED ORDER — METOPROLOL TARTRATE 12.5 MG HALF TABLET
12.5000 mg | ORAL_TABLET | Freq: Two times a day (BID) | ORAL | Status: DC
  Administered 2014-11-17 – 2014-11-18 (×3): 12.5 mg via ORAL
  Filled 2014-11-17 (×7): qty 1

## 2014-11-17 NOTE — Progress Notes (Signed)
PROGRESS NOTE  [name] [name] ZSW:[id number] DOB: [birth year] DOA: 11/09/2014 PCP: [name], MD  HPI/Recap of past 63 hours: 64 year old male with past medical history of atrial fibrillation, diabetes mellitus and end-stage renal disease on hemodialysis as well as previous MRSA pleural effusion admitted on 3/14 for 3 days of shortness of breath and found to have complex fluid collection in right thorax, transferred to [facility].  Patient seen by cardiothoracic surgery and right sided chest tube placed. This continued to drain since, over 6 L. 3/21, chest x-ray noted significant improvement. by 3/22, drainage was minimal and cardiothoracic surgery removed tube.  Patient himself with no complaints.   He reported some loose stooling on 3/19, but nothing since By nursing protocol, stool sent for C. difficile which was positive. because he has no other clinical signs, this is being treated as colonization and not treated   Patient with no complaints. Tolerated chest tube out  Assessment/Plan: Active Problems:   Atrial fibrillation: On chronic anticoagulation. Discussed with vascular surgery and started on IV heparin. Continue amiodarone. Coumadin restarted 3/20.  INR today at 1.46. Heart rate has been slightly tachycardic, but blood pressure slightly low. Have restarted his metoprolol 75 twice a day at 12.5 by mouth twice a day. Depending on how his blood pressure and heart rate go, can titrate up for discharge     ESRD (end stage renal disease) on dialysis: Nephrology following. Next hemodialysis session scheduled for later today.    DM type 2 causing ESRD: Blood sugars remained stable, under 150    Anemia of chronic disease: Overall stable staying above 10    Recurrent right pleural effusion: Status post chest tube. Appreciate cardiothoracic surgery help, continue chest tube. Cause is suspected to be secondary to previous bleed several months ago. Chest tube  removed 3/22 with follow-up chest x-ray on 3/23  ? C. difficile culture: Suspect this is more C. difficile colonization. Patient had some reports of loose stools on 3/19, however no fever no white count and he's had no problems today. Would favor holding off on treating at this time   Code Status: Full code  Family Communication: Spoke with wife yesterday  Disposition Plan: Home likely tomorrow. Pending items are therapeutic INR (patient does not necessarily have to be in the hospital for this) and control heart rate/adjusting home dose of metoprolol   Consultants:  Cardiothoracic surgery  Procedures:  Chest tube placement done 3/15  Antibiotics:  IV vancomycin 3/15-3/20  IV Zosyn 3/15-3/20   Objective: BP 103/67 mmHg  Pulse 104  Temp(Src) 97.8 F (36.6 C) (Oral)  Resp 18  Ht 6\' 4"  (1.93 m)  Wt 83 kg (182 lb 15.7 oz)  BMI 22.28 kg/m2  SpO2 99%  Intake/Output Summary (Last 24 hours) at 11/17/14 1433 Last data filed at 11/17/14 0700  Gross per 24 hour  Intake    228 ml  Output      0 ml  Net    228 ml   Filed Weights   11/15/14 0509 11/16/14 0557 11/17/14 0850  Weight: 81.9 kg (180 lb 8.9 oz) 82.8 kg (182 lb 8.7 oz) 83 kg (182 lb 15.7 oz)    Exam:   General:  Alert and oriented 3, no acute distress  Cardiovascular: Irregular rhythm, rate controlled, borderline tachycardia  Respiratory: Clear to auscultation bilaterally  Abdomen: Soft, nontender, nondistended, positive bowel sounds  Musculoskeletal: No clubbing or cyanosis or edema   Data Reviewed: Basic Metabolic Panel:  Recent Labs Lab 11/10/14 1700 11/11/14 0437 11/14/14 1438  NA 136 137 134*  K 5.8* 4.5 3.9  CL 97 98 96  CO2 21 27 25   GLUCOSE 141* 99 134*  BUN 42* 18 24*  CREATININE 8.35* 5.16* 7.03*  CALCIUM 7.7* 7.8* 7.7*  PHOS 5.3*  --  3.2   Liver Function Tests:  Recent Labs Lab 11/10/14 1700 11/14/14 1438  ALBUMIN 2.4* 2.0*   No results for input(s): LIPASE, AMYLASE in  the last 168 hours. No results for input(s): AMMONIA in the last 168 hours. CBC:  Recent Labs Lab 11/13/14 0058 11/14/14 0318 11/15/14 0535 11/16/14 0525 11/17/14 0536  WBC 8.2 8.1 8.2 7.9 7.9  HGB 10.0* 10.0* 9.8* 9.7* 9.5*  HCT 32.0* 32.1* 32.1* 31.4* 30.3*  MCV 88.9 89.2 90.7 90.0 88.3  PLT 210 195 200 216 202   Cardiac Enzymes:   No results for input(s): CKTOTAL, CKMB, CKMBINDEX, TROPONINI in the last 168 hours. BNP (last 3 results) No results for input(s): BNP in the last 8760 hours.  ProBNP (last 3 results)  Recent Labs  08/15/14 0245  PROBNP >70000.0*    CBG:  Recent Labs Lab 11/16/14 1119 11/16/14 1713 11/16/14 2151 11/17/14 0550 11/17/14 1236  GLUCAP 116* 148* 84 121* 115*    Recent Results (from the past 240 hour(s))  MRSA PCR Screening     Status: Abnormal   Collection Time: 11/09/14  7:07 PM  Result Value Ref Range Status   MRSA by PCR POSITIVE (A) NEGATIVE Final    Comment:        The GeneXpert MRSA Assay (FDA approved for NASAL specimens only), is one component of a comprehensive MRSA colonization surveillance program. It is not intended to diagnose MRSA infection nor to guide or monitor treatment for MRSA infections. RESULT CALLED TO, READ BACK BY AND VERIFIED WITH: SPRADLING,S RN 2243 11/09/14 MITCHELL,L   Culture, routine-abscess     Status: None   Collection Time: 11/10/14  2:38 PM  Result Value Ref Range Status   Specimen Description ABSCESS RIGHT PLEURAL  Final   Special Requests NONE  Final   Gram Stain   Final    ABUNDANT WBC PRESENT,BOTH PMN AND MONONUCLEAR NO SQUAMOUS EPITHELIAL CELLS SEEN NO ORGANISMS SEEN Performed at [facility]    Culture   Final    NO GROWTH 3 DAYS Performed at [facility]    Report Status 11/14/2014 FINAL  Final  Clostridium Difficile by PCR     Status: Abnormal   Collection Time: 11/15/14  6:40 AM  Result Value Ref Range Status   C difficile by pcr POSITIVE (A) NEGATIVE  Final    Comment: CRITICAL RESULT CALLED TO, READ BACK BY AND VERIFIED WITH: D.MURPHY,RN 11/15/14 @1158  BY V.WILKINS      Studies: Dg Chest Port 1 View  11/16/2014   CLINICAL DATA:  Pleural effusion.  Chest tube.  EXAM: PORTABLE CHEST - 1 VIEW  COMPARISON:  11/14/2014.  CT 11/09/2014.  FINDINGS: Right chest tube in stable position. Prior CABG and mitral valve replacement. Stable cardiomegaly. No pulmonary venous congestion. Right base and right anterior medial pleural fluid collections are stable. Right base pleural fluid collection is improved significantly from prior CT of 11/09/2014 . Anterior medial fluid collection remains unchanged. No focal infiltrate or pneumothorax.  IMPRESSION: 1. Right chest tube in stable position. Right base and right anterior medial pleural fluid collections are stable. The right base pleural fluid collection has improved significantly from 11/09/2014.  The right anterior medial pleural fluid collection is unchanged. 2. Prior CABG and mitral valve replacement. Stable cardiomegaly. No pulmonary venous congestion.   Electronically Signed   By: Marcello Moores  Register   On: 11/16/2014 07:20    Scheduled Meds: . allopurinol  100 mg Oral Daily  . amiodarone  200 mg Oral Daily  . cinacalcet  30 mg Oral Q breakfast  . [START ON 11/19/2014] Darbepoetin Alfa  100 mcg Intravenous Q Thu-HD  . ferric gluconate (FERRLECIT/NULECIT) IV  62.5 mg Intravenous Q Thu-1800  . FLUoxetine  20 mg Oral Daily  . insulin aspart  0-5 Units Subcutaneous QHS  . insulin aspart  0-9 Units Subcutaneous TID WC  . insulin glargine  10 Units Subcutaneous QHS  . metoprolol tartrate  12.5 mg Oral BID  . midodrine  10 mg Oral BID WC  . multivitamin  1 tablet Oral QHS  . pantoprazole  40 mg Oral BID  . sevelamer carbonate  800 mg Oral TID WC  . sodium chloride  3 mL Intravenous Q12H  . sodium chloride  3 mL Intravenous Q12H  . traZODone  50 mg Oral QHS  . warfarin  1 mg Oral ONCE-1800  . Warfarin -  Pharmacist Dosing Inpatient   Does not apply q1800    Continuous Infusions: . heparin 1,900 Units/hr (11/15/14 0234)     Time spent: 25 min  [place] Pager [phone]. If 7PM-7AM, please contact night-coverage at [email or web address], password [email or web address] 11/17/2014, 2:33 PM  LOS: 8 days

## 2014-11-17 NOTE — Progress Notes (Addendum)
     [address]       [place] [address]             [phone]   Subjective:  Mr. Holley Wirt has no complaints this morning.  Objective: Vital signs in last 24 hours: Temp:  [97.6 F (36.4 C)-97.8 F (36.6 C)] 97.6 F (36.4 C) (03/22 0430) Pulse Rate:  [53-54] 53 (03/22 0430) Cardiac Rhythm:  [-] Atrial fibrillation (03/22 0849) Resp:  [17-18] 18 (03/22 0430) BP: (93-96)/(67-69) 96/67 mmHg (03/22 0430) SpO2:  [96 %-99 %] 99 % (03/22 0430)  Intake/Output from previous day: 03/21 0701 - 03/22 0700 In: 468 [P.O.:240; I.V.:228] Out: -   General appearance: alert, cooperative and no distress Heart: irregularly irregular rhythm Lungs: diminished breath sounds right base Abdomen: soft, non-tender; bowel sounds normal; no masses,  no organomegaly Wound: clean and dry  Lab Results:  Recent Labs  11/16/14 0525 11/17/14 0536  WBC 7.9 7.9  HGB 9.7* 9.5*  HCT 31.4* 30.3*  PLT 216 202   BMET:  Recent Labs  11/14/14 1438  NA 134*  K 3.9  CL 96  CO2 25  GLUCOSE 134*  BUN 24*  CREATININE 7.03*  CALCIUM 7.7*    PT/INR:  Recent Labs  11/17/14 0536  LABPROT 17.9*  INR 1.46   ABG    Component Value Date/Time   PHART 7.406 02/13/2014 2235   HCO3 22.4 02/13/2014 2235   TCO2 20.4 02/13/2014 2235   ACIDBASEDEF 1.6 02/13/2014 2235   O2SAT 89.9 02/13/2014 2235   CBG (last 3)   Recent Labs  11/16/14 1713 11/16/14 2151 11/17/14 0550  GLUCAP 148* 84 121*    Assessment/Plan:  DRAINAGE OF PLEURAL EFFUSION (Right) vis right pigtail cathter- unclear cause of the right effusion, suspect old intrapleural bleed with hematoma now with chronic effusion    1. Loculated right pleural effusion- chest tube with 20 cc of output in last 24 hours 2. Dispo- d/c chest tube, care per primary   LOS: 8 days    BARRETT, ERIN 11/17/2014   Chest tube out today I have seen and examined Marice Potter and agree with the above assessment  and plan.  Grace Isaac MD Beeper [phone] Office [phone] 11/17/2014 10:30 AM

## 2014-11-17 NOTE — Progress Notes (Signed)
Chest tube d/c per MD order; per PA; place vasoline gauze and tight pressure dressing to site; pt tolerated well; will cont. To monitor.

## 2014-11-17 NOTE — Procedures (Signed)
I have seen and examined this patient and agree with the plan of care . Patient seen and evaluated on dialysis with no complaints  [name] [name] W 11/17/2014, 10:08 AM

## 2014-11-17 NOTE — Progress Notes (Signed)
ANTICOAGULATION CONSULT NOTE - Initial Consult  Pharmacy Consult for heparin/warfarin  Indication: atrial fibrillation  Allergies  Allergen Reactions  . Bacitracin Other (See Comments)    unknown  . Norvasc [Amlodipine Besylate] Other (See Comments)    unknown    Patient Measurements: Height: 6\' 4"  (193 cm) Weight: 182 lb 8.7 oz (82.8 kg) IBW/kg (Calculated) : 86.8 Heparin Dosing Weight: 83 kg  Vital Signs: Temp: 97.6 F (36.4 C) (03/22 0430) Temp Source: Oral (03/22 0430) BP: 96/67 mmHg (03/22 0430) Pulse Rate: 53 (03/22 0430)  Labs:  Recent Labs  11/14/14 1438  11/15/14 0535 11/16/14 0525 11/17/14 0536  HGB  --   < > 9.8* 9.7* 9.5*  HCT  --   --  32.1* 31.4* 30.3*  PLT  --   --  200 216 202  LABPROT  --   --   --   --  17.9*  INR  --   --   --   --  1.46  HEPARINUNFRC  --   --  0.59 0.57 0.61  CREATININE 7.03*  --   --   --   --   < > = values in this interval not displayed.  Estimated Creatinine Clearance: 12.3 mL/min (by C-G formula based on Cr of 7.03).   Medical History: Past Medical History  Diagnosis Date  . UGI bleed 02/12/11    On anticoagulation; EGD w/snare polypectomy-multiple polypoid lesions antrum(benign), Chronic active gastritis (NEGATIVE H pylori)  . Anemia, chronic disease   . Sepsis due to enterococcus 02/11/11  . Atrial fibrillation     On coumadin  . Coronary atherosclerosis of native coronary artery     Multivessel status post CABG  . Cardiomyopathy   . Type 2 diabetes mellitus   . Essential hypertension, benign   . Hyperlipemia   . Gout   . S/P patent foramen ovale closure   . Hyperbilirubinemia 08/10/2013  . SBO (small bowel obstruction) 01/2014  . 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 Clostridium difficile colitis 01/2014  . Diverticulosis   . Internal hemorrhoids   . ESRD on hemodialysis     Started 2008-2009. Dr Hinda Lenis on a TTS schedule - R forearm AVF for several years      Medications:  Infusions:  . heparin 1,900  Units/hr (11/15/14 0234)    Assessment: Pt on warfarin PTA for hx of AFib.  PTA warfarin dose: 0.5 mg Tue/Thurs/Sat, 1 mg on all other days.  Continues on heparin gtt while inpatient. HL therapeutic, h/h 9.5/30, plts wnl, warfarin was resumed yesterday. INR 1.4   Goal of Therapy:  INR 2-3 Heparin level 0.3-0.7 units/ml Monitor platelets by anticoagulation protocol: Yes   Plan:  -Heparin at 1900 units/hr -Warfarin 1 mg po x1 -Daily INR, HL, CBC -Monitor for s/sx bleeding -F/u plan for chest tube removal   Hughes Better, PharmD, BCPS Clinical Pharmacist Pager: [phone] 11/17/2014 8:49 AM

## 2014-11-18 ENCOUNTER — Inpatient Hospital Stay (HOSPITAL_COMMUNITY): Payer: Medicare Other

## 2014-11-18 LAB — GLUCOSE, CAPILLARY
Glucose-Capillary: 125 mg/dL — ABNORMAL HIGH (ref 70–99)
Glucose-Capillary: 109 mg/dL — ABNORMAL HIGH (ref 70–99)
Glucose-Capillary: 144 mg/dL — ABNORMAL HIGH (ref 70–99)
Glucose-Capillary: 108 mg/dL — ABNORMAL HIGH (ref 70–99)

## 2014-11-18 LAB — CBC
HCT: 32.9 % — ABNORMAL LOW (ref 39.0–52.0)
HEMOGLOBIN: 10.1 g/dL — AB (ref 13.0–17.0)
MCH: 27.2 pg (ref 26.0–34.0)
MCHC: 30.7 g/dL (ref 30.0–36.0)
MCV: 88.7 fL (ref 78.0–100.0)
PLATELETS: 231 10*3/uL (ref 150–400)
RBC: 3.71 MIL/uL — ABNORMAL LOW (ref 4.22–5.81)
RDW: 16.2 % — ABNORMAL HIGH (ref 11.5–15.5)
WBC: 8.5 10*3/uL (ref 4.0–10.5)

## 2014-11-18 LAB — PROTIME-INR
INR: 1.4 (ref 0.00–1.49)
PROTHROMBIN TIME: 17.3 s — AB (ref 11.6–15.2)

## 2014-11-18 LAB — HEPARIN LEVEL (UNFRACTIONATED): Heparin Unfractionated: 0.55 IU/mL (ref 0.30–0.70)

## 2014-11-18 MED ORDER — WARFARIN SODIUM 2 MG PO TABS
2.0000 mg | ORAL_TABLET | Freq: Once | ORAL | Status: AC
  Administered 2014-11-18: 2 mg via ORAL
  Filled 2014-11-18: qty 1

## 2014-11-18 MED ORDER — WARFARIN SODIUM 1 MG PO TABS
1.0000 mg | ORAL_TABLET | Freq: Once | ORAL | Status: DC
  Filled 2014-11-18: qty 1

## 2014-11-18 NOTE — Progress Notes (Signed)
[facility] ROUNDING NOTE   Subjective:   Interval History: stable this morning no complaints.  Vital signs in last 24 hours:  Temp:  [97.6 F (36.4 C)-98.1 F (36.7 C)] 97.6 F (36.4 C) (03/23 0429) Pulse Rate:  [104-131] 108 (03/23 0429) Resp:  [13-20] 17 (03/23 0429) BP: (98-128)/(36-70) 121/36 mmHg (03/23 0429) SpO2:  [95 %] 95 % (03/23 0429) Weight:  [80.7 kg (177 lb 14.6 oz)] 80.7 kg (177 lb 14.6 oz) (03/23 0429)  Weight change:  Filed Weights   11/16/14 0557 11/17/14 0850 11/18/14 0429  Weight: 82.8 kg (182 lb 8.7 oz) 83 kg (182 lb 15.7 oz) 80.7 kg (177 lb 14.6 oz)    Intake/Output: I/O last 3 completed shifts: In: 1610 [P.O.:600; I.V.:665] Out: 3000 [Other:3000]   Intake/Output this shift:     CVS- RRR RS- CTA ABD- BS present soft non-distended EXT- no edema   Basic Metabolic Panel:  Recent Labs Lab 11/14/14 1438  NA 134*  K 3.9  CL 96  CO2 25  GLUCOSE 134*  BUN 24*  CREATININE 7.03*  CALCIUM 7.7*  PHOS 3.2    Liver Function Tests:  Recent Labs Lab 11/14/14 1438  ALBUMIN 2.0*   No results for input(s): LIPASE, AMYLASE in the last 168 hours. No results for input(s): AMMONIA in the last 168 hours.  CBC:  Recent Labs Lab 11/14/14 0318 11/15/14 0535 11/16/14 0525 11/17/14 0536 11/18/14 0520  WBC 8.1 8.2 7.9 7.9 8.5  HGB 10.0* 9.8* 9.7* 9.5* 10.1*  HCT 32.1* 32.1* 31.4* 30.3* 32.9*  MCV 89.2 90.7 90.0 88.3 88.7  PLT 195 200 216 202 231    Cardiac Enzymes: No results for input(s): CKTOTAL, CKMB, CKMBINDEX, TROPONINI in the last 168 hours.  BNP: Invalid input(s): POCBNP  CBG:  Recent Labs Lab 11/16/14 2151 11/17/14 0550 11/17/14 1236 11/17/14 2130 11/18/14 0631  GLUCAP 84 121* 115* 143* 125*    Microbiology: Results for orders placed or performed during the hospital encounter of 11/09/14  MRSA PCR Screening     Status: Abnormal   Collection Time: 11/09/14  7:07 PM  Result Value Ref Range Status    MRSA by PCR POSITIVE (A) NEGATIVE Final    Comment:        The GeneXpert MRSA Assay (FDA approved for NASAL specimens only), is one component of a comprehensive MRSA colonization surveillance program. It is not intended to diagnose MRSA infection nor to guide or monitor treatment for MRSA infections. RESULT CALLED TO, READ BACK BY AND VERIFIED WITH: SPRADLING,S RN 2243 11/09/14 MITCHELL,L   Culture, routine-abscess     Status: None   Collection Time: 11/10/14  2:38 PM  Result Value Ref Range Status   Specimen Description ABSCESS RIGHT PLEURAL  Final   Special Requests NONE  Final   Gram Stain   Final    ABUNDANT WBC PRESENT,BOTH PMN AND MONONUCLEAR NO SQUAMOUS EPITHELIAL CELLS SEEN NO ORGANISMS SEEN Performed at [facility]    Culture   Final    NO GROWTH 3 DAYS Performed at [facility]    Report Status 11/14/2014 FINAL  Final  Clostridium Difficile by PCR     Status: Abnormal   Collection Time: 11/15/14  6:40 AM  Result Value Ref Range Status   C difficile by pcr POSITIVE (A) NEGATIVE Final    Comment: CRITICAL RESULT CALLED TO, READ BACK BY AND VERIFIED WITH: D.MURPHY,RN 11/15/14 @1158  BY V.WILKINS     Coagulation Studies:  Recent Labs  11/17/14 0536 11/18/14 0510  LABPROT 17.9* 17.3*  INR 1.46 1.40    Urinalysis: No results for input(s): COLORURINE, LABSPEC, PHURINE, GLUCOSEU, HGBUR, BILIRUBINUR, KETONESUR, PROTEINUR, UROBILINOGEN, NITRITE, LEUKOCYTESUR in the last 72 hours.  Invalid input(s): APPERANCEUR    Imaging: Dg Chest 2 View  11/18/2014   CLINICAL DATA:  Chest soreness, removal of chest to  EXAM: CHEST  2 VIEW  COMPARISON:  Portable chest x-ray of 11/16/2014, and CT chest of 11/08/2004  FINDINGS: There is little change in opacity at the right lung base consistent with right effusion. Hazy opacity in the right mid lung also persists which may represent loculated effusion. The patient is again somewhat rotated toward the right with  somewhat prominent right paratracheal soft tissues, possibly due to loculated effusion as well. Cardiomegaly is stable. Minimal pulmonary vascular congestion cannot be excluded.  IMPRESSION: 1. Little change in right pleural effusion, volume loss on the right, and probable loculated effusion creating opacity in the right mid lung and right paratracheal region. 2. Cardiomegaly.  Question mild pulmonary vascular congestion. .   Electronically Signed   By: Ivar Drape M.D.   On: 11/18/2014 08:55     Medications:   . heparin 1,900 Units/hr (11/17/14 2329)   . allopurinol  100 mg Oral Daily  . amiodarone  200 mg Oral Daily  . cinacalcet  30 mg Oral Q breakfast  . [START ON 11/19/2014] Darbepoetin Alfa  100 mcg Intravenous Q Thu-HD  . ferric gluconate (FERRLECIT/NULECIT) IV  62.5 mg Intravenous Q Thu-1800  . FLUoxetine  20 mg Oral Daily  . insulin aspart  0-5 Units Subcutaneous QHS  . insulin aspart  0-9 Units Subcutaneous TID WC  . insulin glargine  10 Units Subcutaneous QHS  . metoprolol tartrate  12.5 mg Oral BID  . midodrine  10 mg Oral BID WC  . multivitamin  1 tablet Oral QHS  . pantoprazole  40 mg Oral BID  . sevelamer carbonate  800 mg Oral TID WC  . sodium chloride  3 mL Intravenous Q12H  . sodium chloride  3 mL Intravenous Q12H  . traZODone  50 mg Oral QHS  . warfarin  2 mg Oral ONCE-1800  . Warfarin - Pharmacist Dosing Inpatient   Does not apply q1800   albuterol, ondansetron **OR** ondansetron (ZOFRAN) IV, traMADol  Assessment/ Plan:   ESRD- ESRD TTS   ANEMIA- Hb stable on ESA  MBD- Ca corrected  Continue cinacalcet  HTN/VOL- stable  Access AVF  Plan dialysis in AM     LOS: 9 Serenidy Waltz W @TODAY @10 :35 AM

## 2014-11-18 NOTE — Progress Notes (Addendum)
ANTICOAGULATION CONSULT NOTE - Initial Consult  Pharmacy Consult for heparin/warfarin  Indication: atrial fibrillation  Allergies  Allergen Reactions  . Bacitracin Other (See Comments)    unknown  . Norvasc [Amlodipine Besylate] Other (See Comments)    unknown    Patient Measurements: Height: 6\' 4"  (193 cm) Weight: 177 lb 14.6 oz (80.7 kg) IBW/kg (Calculated) : 86.8 Heparin Dosing Weight: 83 kg  Vital Signs: Temp: 97.6 F (36.4 C) (03/23 0429) Temp Source: Oral (03/23 0429) BP: 121/36 mmHg (03/23 0429) Pulse Rate: 108 (03/23 0429)  Labs:  Recent Labs  11/16/14 0525 11/17/14 0536 11/18/14 0510 11/18/14 0520  HGB 9.7* 9.5*  --  10.1*  HCT 31.4* 30.3*  --  32.9*  PLT 216 202  --  231  LABPROT  --  17.9* 17.3*  --   INR  --  1.46 1.40  --   HEPARINUNFRC 0.57 0.61 0.55  --     Estimated Creatinine Clearance: 12 mL/min (by C-G formula based on Cr of 7.03).   Medical History: Past Medical History  Diagnosis Date  . UGI bleed 02/12/11    On anticoagulation; EGD w/snare polypectomy-multiple polypoid lesions antrum(benign), Chronic active gastritis (NEGATIVE H pylori)  . Anemia, chronic disease   . Sepsis due to enterococcus 02/11/11  . Atrial fibrillation     On coumadin  . Coronary atherosclerosis of native coronary artery     Multivessel status post CABG  . Cardiomyopathy   . Type 2 diabetes mellitus   . Essential hypertension, benign   . Hyperlipemia   . Gout   . S/P patent foramen ovale closure   . Hyperbilirubinemia 08/10/2013  . SBO (small bowel obstruction) 01/2014  . 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 Clostridium difficile colitis 01/2014  . Diverticulosis   . Internal hemorrhoids   . ESRD on hemodialysis     Started 2008-2009. Dr Hinda Lenis on a TTS schedule - R forearm AVF for several years      Medications:  Infusions:  . heparin 1,900 Units/hr (11/17/14 2329)    Assessment: Pt on warfarin PTA for hx of AFib.  PTA warfarin dose: 0.5 mg  Tue/Thurs/Sat, 1 mg on all other days.  Continues on heparin gtt while inpatient. HL therapeutic, h/h 10.1/32.9, plts wnl, warfarin was resumed on 3/20, current INR 1.4.   Goal of Therapy:  INR 2-3 Heparin level 0.3-0.7 units/ml Monitor platelets by anticoagulation protocol: Yes   Plan:  -Heparin at 1900 units/hr -Warfarin 2 mg po x1 -Daily INR, HL, CBC -Monitor for s/sx bleeding   Hughes Better, PharmD, BCPS Clinical Pharmacist Pager: [phone] 11/18/2014 8:41 AM

## 2014-11-18 NOTE — Progress Notes (Addendum)
       [address]       [place], [address]             [phone]            Procedure(s) (LRB): VIDEO BRONCHOSCOPY (N/A) VIDEO ASSISTED THORACOSCOPY (Right) DRAINAGE OF PLEURAL EFFUSION (Right) PLEURAL BIOPSY (N/A)  Subjective: Breathing stable, no new complaints.   Objective: Vital signs in last 24 hours: Patient Vitals for the past 24 hrs:  BP Temp Temp src Pulse Resp SpO2 Weight  11/18/14 0429 (!) 121/36 mmHg 97.6 F (36.4 C) Oral (!) 108 17 95 % 177 lb 14.6 oz (80.7 kg)  11/17/14 2134 (!) 102/56 mmHg 98.1 F (36.7 C) Oral (!) 115 18 95 % -  11/17/14 1730 (!) 98/57 mmHg 98 F (36.7 C) Oral (!) 120 20 - -  11/17/14 1700 101/67 mmHg - - (!) 120 - - -  11/17/14 1630 128/67 mmHg - - (!) 120 - - -  11/17/14 1600 (!) 106/58 mmHg - - (!) 131 17 - -  11/17/14 1530 103/68 mmHg - - (!) 117 - - -  11/17/14 1500 106/70 mmHg - - (!) 110 20 - -  11/17/14 1430 115/62 mmHg - - (!) 105 16 - -  11/17/14 1415 103/67 mmHg - - (!) 104 18 - -  11/17/14 1405 105/66 mmHg 97.8 F (36.6 C) Oral (!) 109 13 - -  11/17/14 0850 105/66 mmHg 97.8 F (36.6 C) Oral (!) 109 16 99 % 182 lb 15.7 oz (83 kg)   Current Weight  11/18/14 177 lb 14.6 oz (80.7 kg)     Intake/Output from previous day: 03/22 0701 - 03/23 0700 In: 1037 [P.O.:600; I.V.:437] Out: 3000     PHYSICAL EXAM:  Heart: Irr irr Lungs: Decreased BS in bases Wound: Chest tube site clean and dry    Lab Results: CBC: Recent Labs  11/17/14 0536 11/18/14 0520  WBC 7.9 8.5  HGB 9.5* 10.1*  HCT 30.3* 32.9*  PLT 202 231   BMET: No results for input(s): NA, K, CL, CO2, GLUCOSE, BUN, CREATININE, CALCIUM in the last 72 hours.  PT/INR:  Recent Labs  11/18/14 0510  LABPROT 17.3*  INR 1.40      Assessment/Plan: S/P Procedure(s) (LRB): VIDEO BRONCHOSCOPY (N/A) VIDEO ASSISTED THORACOSCOPY (Right) DRAINAGE OF PLEURAL EFFUSION (Right) PLEURAL BIOPSY (N/A) Stable following CT removal. Ok from  thoracic standpoint to d/c when medically stable.   LOS: 9 days    COLLINS,GINA H 11/18/2014  Follow up chest xray in 2-3 weeks I have seen and examined [name] [name] and agree with the above assessment  and plan.  Grace Isaac MD Beeper [phone] Office [phone] 11/18/2014 9:04 AM

## 2014-11-18 NOTE — Progress Notes (Signed)
[facility]                                                                              Patient Demographics  [name] [name], is a 65 y.o. male, DOB - November 03, 1950, QMV:[id number]  Admit date - 11/09/2014   Admitting Physician Grace Isaac, MD  Outpatient Primary MD for the patient is [name], MD  LOS - 9   Chief Complaint  Patient presents with  . Shortness of Breath      HPI on 11/09/2014 by Dr. Hurshel Party This is a 64 year old man who presents with a three-day history of increasing shortness of breath. He has a cough productive mainly of white sputum but occasionally yellow sputum. He denies any fever. There is no nausea, vomiting, abdominal pain. He has end-stage renal disease on hemodialysis and gets dialysis on Tuesdays, Thursdays and Saturdays. Evaluation in the emergency room showed him to have a large loculated right-sided pleural effusion. In December of last year he was admitted with what appeared to be a loculated effusion also on the right side associated with pneumonia. At that time he had thoracentesis and was treated with antibiotics. He did well after this. He now returns with a recurrent right pleural effusion. He is going to be admitted for further management.  Assessment & Plan   Recurrent right pleural effusion -Cardiothoracic surgery consulted and appreciated, patient did have chest tube in place which was removed 11/17/2014 -Chest x-ray 11/18/2014 shows a total change in right pleural effusion, volume loss on the right, probable loculated effusion creating opacity in the right midlung and right paratracheal region -Cause suspected to be secondary to previous bleed several months ago -Patient will need to follow-up with Dr. Servando Snare and have repeat CXR in 2-3 weeks  Atrial fibrillation -On chronic anticoagulation, INR currently subtherapeutic Coumadin recently restarted on 11/15/2014 -Continue heparin IV -Continue amiodarone, metoprolol     End-stage renal disease on hemodialysis -Nephrology consulted and appreciated  Type 2 diabetes mellitus -Continue, and some plain scale, CBG monitoring  Anemia of chronic disease -Hemoglobin remaining stable at 10  Question C. difficile culture -Possible C. difficile colonization, patient denies any diarrhea or loose stools at this time -Patient currently afebrile and no leukocytosis  Code Status: Full  Family Communication: None at bedside  Disposition Plan: Admitted, will need HD in the morning, INR subtherapeutic  Time Spent in minutes   30 minutes  Procedures  VATS Drainage of Right pleural effusion Chest tube placement 11/10/2014  Consults   Nephrology Interventional radiology Cardiothoracic surgery  DVT Prophylaxis  Coumadin/heparin  Lab Results  Component Value Date   PLT 231 11/18/2014    Medications  Scheduled Meds: . allopurinol  100 mg Oral Daily  . amiodarone  200 mg Oral Daily  . cinacalcet  30 mg Oral Q breakfast  . [START ON 11/19/2014] Darbepoetin Alfa  100 mcg Intravenous Q Thu-HD  . ferric gluconate (FERRLECIT/NULECIT) IV  62.5 mg Intravenous Q Thu-1800  . FLUoxetine  20 mg Oral Daily  . insulin aspart  0-5 Units Subcutaneous QHS  . insulin aspart  0-9 Units Subcutaneous TID WC  . insulin glargine  10 Units Subcutaneous QHS  .  metoprolol tartrate  12.5 mg Oral BID  . midodrine  10 mg Oral BID WC  . multivitamin  1 tablet Oral QHS  . pantoprazole  40 mg Oral BID  . sevelamer carbonate  800 mg Oral TID WC  . sodium chloride  3 mL Intravenous Q12H  . sodium chloride  3 mL Intravenous Q12H  . traZODone  50 mg Oral QHS  . warfarin  2 mg Oral ONCE-1800  . Warfarin - Pharmacist Dosing Inpatient   Does not apply q1800   Continuous Infusions: . heparin 1,900 Units/hr (11/17/14 2329)   PRN Meds:.albuterol, ondansetron **OR** ondansetron (ZOFRAN) IV, traMADol  Antibiotics    Anti-infectives    Start     Dose/Rate Route Frequency Ordered Stop    11/12/14 1200  vancomycin (VANCOCIN) IVPB 1000 mg/200 mL premix  Status:  Discontinued     1,000 mg 200 mL/hr over 60 Minutes Intravenous Every T-Th-Sa (Hemodialysis) 11/10/14 1951 11/15/14 1740   11/10/14 2000  vancomycin (VANCOCIN) IVPB 1000 mg/200 mL premix     1,000 mg 200 mL/hr over 60 Minutes Intravenous  Once 11/10/14 1950 11/10/14 2302   11/10/14 1600  piperacillin-tazobactam (ZOSYN) IVPB 2.25 g  Status:  Discontinued     2.25 g 100 mL/hr over 30 Minutes Intravenous Every 8 hours 11/10/14 1503 11/10/14 2334   11/10/14 1100  piperacillin-tazobactam (ZOSYN) IVPB 2.25 g  Status:  Discontinued     2.25 g 100 mL/hr over 30 Minutes Intravenous Every 8 hours 11/10/14 1011 11/15/14 1740   11/10/14 1030  vancomycin (VANCOCIN) 2,000 mg in sodium chloride 0.9 % 500 mL IVPB     2,000 mg 250 mL/hr over 120 Minutes Intravenous  Once 11/10/14 1010 11/10/14 1628        Subjective:   [name] [name] seen and examined today.  Patient has no complaints today, and wishes to go home. Denies any abdominal pain, chest pain, shortness of breath, diarrhea at this time.  Objective:   Filed Vitals:   11/17/14 1730 11/17/14 2134 11/18/14 0429 11/18/14 1100  BP: 98/57 102/56 121/36 127/100  Pulse: 120 115 108 108  Temp: 98 F (36.7 C) 98.1 F (36.7 C) 97.6 F (36.4 C)   TempSrc: Oral Oral Oral   Resp: 20 18 17    Height:      Weight:   80.7 kg (177 lb 14.6 oz)   SpO2:  95% 95%     Wt Readings from Last 3 Encounters:  11/18/14 80.7 kg (177 lb 14.6 oz)  10/19/14 88.905 kg (196 lb)  09/29/14 113.399 kg (250 lb)     Intake/Output Summary (Last 24 hours) at 11/18/14 1508 Last data filed at 11/18/14 0915  Gross per 24 hour  Intake    917 ml  Output   3002 ml  Net  -2085 ml    Exam  General: Well developed, well nourished, NAD  HEENT: NCAT, mucous membranes moist.   Cardiovascular: S1 S2 auscultated, irregularly irregular  Respiratory: Clear to auscultation   Abdomen: Soft,  nontender, nondistended, + bowel sounds  Extremities: warm dry without cyanosis clubbing or edema  Neuro: AAOx3, nonfocal  Data Review   Micro Results Recent Results (from the past 240 hour(s))  MRSA PCR Screening     Status: Abnormal   Collection Time: 11/09/14  7:07 PM  Result Value Ref Range Status   MRSA by PCR POSITIVE (A) NEGATIVE Final    Comment:        The GeneXpert MRSA Assay (FDA approved  for NASAL specimens only), is one component of a comprehensive MRSA colonization surveillance program. It is not intended to diagnose MRSA infection nor to guide or monitor treatment for MRSA infections. RESULT CALLED TO, READ BACK BY AND VERIFIED WITH: SPRADLING,S RN [id number] 11/09/14 MITCHELL,L   Culture, routine-abscess     Status: None   Collection Time: 11/10/14  2:38 PM  Result Value Ref Range Status   Specimen Description ABSCESS RIGHT PLEURAL  Final   Special Requests NONE  Final   Gram Stain   Final    ABUNDANT WBC PRESENT,BOTH PMN AND MONONUCLEAR NO SQUAMOUS EPITHELIAL CELLS SEEN NO ORGANISMS SEEN Performed at [facility]    Culture   Final    NO GROWTH 3 DAYS Performed at [facility]    Report Status 11/14/2014 FINAL  Final  Clostridium Difficile by PCR     Status: Abnormal   Collection Time: 11/15/14  6:40 AM  Result Value Ref Range Status   C difficile by pcr POSITIVE (A) NEGATIVE Final    Comment: CRITICAL RESULT CALLED TO, READ BACK BY AND VERIFIED WITH: D.MURPHY,RN 11/15/14 @1158  BY V.WILKINS     Radiology Reports Dg Chest 2 View  11/18/2014   CLINICAL DATA:  Chest soreness, removal of chest to  EXAM: CHEST  2 VIEW  COMPARISON:  Portable chest x-ray of 11/16/2014, and CT chest of 11/08/2004  FINDINGS: There is little change in opacity at the right lung base consistent with right effusion. Hazy opacity in the right mid lung also persists which may represent loculated effusion. The patient is again somewhat rotated toward the right with  somewhat prominent right paratracheal soft tissues, possibly due to loculated effusion as well. Cardiomegaly is stable. Minimal pulmonary vascular congestion cannot be excluded.  IMPRESSION: 1. Little change in right pleural effusion, volume loss on the right, and probable loculated effusion creating opacity in the right mid lung and right paratracheal region. 2. Cardiomegaly.  Question mild pulmonary vascular congestion. .   Electronically Signed   By: Ivar Drape M.D.   On: 11/18/2014 08:55   Dg Chest 2 View  11/12/2014   CLINICAL DATA:  Chest tube, essential benign hypertension, type 2 diabetes, coronary artery disease post CABG, atrial fibrillation, end-stage renal disease  EXAM: CHEST  2 VIEW  COMPARISON:  Portable exam of 11/11/2014, chest CT 11/09/2014  FINDINGS: RIGHT basilar pigtail thoracostomy tube unchanged.  Enlargement of cardiac silhouette post MVR and CABG.  Pulmonary vascular congestion.  Persistent atelectasis of RIGHT upper and RIGHT lower lobes.  Persistent loculated RIGHT pleural effusion.  Probable mild pulmonary edema.  No pneumothorax.  Bones demineralized.  IMPRESSION: Enlargement of cardiac silhouette with pulmonary vascular congestion and probable while pulmonary edema.  Persistent loculated RIGHT pleural effusion and RIGHT lung atelectasis.   Electronically Signed   By: Lavonia Dana M.D.   On: 11/12/2014 08:10   Dg Chest 2 View  11/09/2014   CLINICAL DATA:  Shortness of breath  EXAM: CHEST  2 VIEW  COMPARISON:  11/09/2014  FINDINGS: Unchanged cardiomegaly. Aortic and hilar contours or stable from prior. Patient is status post CABG and mitral valve replacement.  No interstitial coarsening with improved lung volumes. There is a moderate right pleural effusion with posterior loculation. The appearance is stable from two-view chest x-ray 08/13/2014, a peripherally enhancing loculated effusion based on abdominal CT 08/13/2014. Prominent right hilar opacity persists without evidence of  hilar adenopathy or mass in the lateral projection. This appearance is likely from the  pleural fluid.  Remote L1 compression fracture with advanced height loss. No acute osseous findings.  IMPRESSION: 1. Chronic, loculated right pleural effusion with atelectasis. 2. No evidence for pulmonary edema on this better inflated study.   Electronically Signed   By: Monte Fantasia M.D.   On: 11/09/2014 10:52   Ct Chest W Contrast  11/09/2014   CLINICAL DATA:  Shortness of breath.  Productive cough.  EXAM: CT CHEST WITH CONTRAST  TECHNIQUE: Multidetector CT imaging of the chest was performed during intravenous contrast administration.  CONTRAST:  58mL OMNIPAQUE IOHEXOL 300 MG/ML  SOLN  COMPARISON:  12/06/2011  FINDINGS: THORACIC INLET/BODY WALL:  No acute abnormality.  MEDIASTINUM:  Stable cardiomegaly, with especially prominent left atrial enlargement. The patient is status post mitral valve replacement and CABG. Portions of the LIMA and venous grafts which are large enough to evaluate are enhancing. No aortic aneurysm or dissection. No evidence of pulmonary embolism. Adenopathy stable to decreased from 2013, with right lower peritracheal and subcarinal nodes remaining the most prominent. A 15 mm metallic body (with the appearance of stent) within a right upper lobe pulmonary artery is stable from previous. There is no associated vessel occlusion.  LUNG WINDOWS:  Chronic loculated pleural effusion in the posterior and lower right chest with thick enhancing rim. The fluid collection is large, measuring 18 cm in craniocaudal span by 10 cm in thickness. The appearance is stable compared to visible portions on abdominal CT 08/13/2014. Based on abdominal CT favor remote hemothorax. Opacification along the chronic pleural effusion has heterogeneous enhancement, swirling interstitium, and subpleural rounded morphology. This is predominantly within the right lower lobe, although also present along the posterior right upper lobe  and right middle lobe. There is small, loculated pleural effusion in the anterior and upper right hemi thorax without pleural nodularity or enhancement in this location. Trace layering left pleural effusion. Stable left lower lobe pulmonary nodule at 6 mm. Given stability from 2013 this is considered benign. Mild apical emphysema and air trapping.  UPPER ABDOMEN:  Hepatic fissure enlargement and subtle liver surface nodularity could reflect early cirrhosis. Cholelithiasis without evidence of acute cholecystitis.  OSSEOUS:  Remote L1 compression fracture with greater than 75% height loss. Remote left posterior rib fractures.  IMPRESSION: 1. Chronic large and complex right pleural effusion, remote hemothorax based on abdominal CT 02/27/2014. 2. Collapsed and distorted right lower lobe secondary to #1, likely with developing rounded atelectasis. Cannot exclude superimposed infection. 3. Indeterminate, small loculated pleural effusion in the right upper chest. 4. Multiple incidental findings are stable from 2013 and noted above.   Electronically Signed   By: Monte Fantasia M.D.   On: 11/09/2014 12:47   Dg Chest Port 1 View  11/16/2014   CLINICAL DATA:  Pleural effusion.  Chest tube.  EXAM: PORTABLE CHEST - 1 VIEW  COMPARISON:  11/14/2014.  CT 11/09/2014.  FINDINGS: Right chest tube in stable position. Prior CABG and mitral valve replacement. Stable cardiomegaly. No pulmonary venous congestion. Right base and right anterior medial pleural fluid collections are stable. Right base pleural fluid collection is improved significantly from prior CT of 11/09/2014 . Anterior medial fluid collection remains unchanged. No focal infiltrate or pneumothorax.  IMPRESSION: 1. Right chest tube in stable position. Right base and right anterior medial pleural fluid collections are stable. The right base pleural fluid collection has improved significantly from 11/09/2014. The right anterior medial pleural fluid collection is unchanged.  2. Prior CABG and mitral valve replacement. Stable cardiomegaly. No pulmonary  venous congestion.   Electronically Signed   By: Marcello Moores  Register   On: 11/16/2014 07:20   Dg Chest Port 1 View  11/14/2014   CLINICAL DATA:  Loculated right pleural effusion, right chest tube  EXAM: PORTABLE CHEST - 1 VIEW  COMPARISON:  11/12/2014  FINDINGS: Stable right base pigtail chest tube. Stable appearance of the loculated right effusion including an anterior medial component extending along the right paratracheal region and hilum accounting for the chest x-ray abnormality. Prior coronary bypass and mitral valve replacement. Left lung remains clear. Persistent right lung atelectasis. No enlarging pneumothorax.  IMPRESSION: Stable right base chest tube and loculated residual right pleural effusion.  Underlying right lung atelectasis.  No developing pneumothorax.   Electronically Signed   By: Jerilynn Mages.  Shick M.D.   On: 11/14/2014 10:00   Dg Chest Port 1 View  11/11/2014   CLINICAL DATA:  64 year old male with chest pain, pleural effusion. Initial encounter.  EXAM: PORTABLE CHEST - 1 VIEW  COMPARISON:  CT-guided chest tube placement images 31516 and earlier.  FINDINGS: Portable AP semi upright view at 1104 hours. Percutaneous pigtail right pleural drain remains in place with regressed thick walled pleural collection. Lower lung volumes compared to 11/09/2014. Stable cardiac size and mediastinal contours. No new cardiopulmonary abnormality identified.  IMPRESSION: Regressed right lung base pleural collection following percutaneous drainage catheter placement. No new cardiopulmonary abnormality identified.   Electronically Signed   By: Genevie Ann M.D.   On: 11/11/2014 11:54   Dg Chest Portable 1 View  11/09/2014   CLINICAL DATA:  Shortness of breath for 1 week.  Dialysis.  EXAM: PORTABLE CHEST - 1 VIEW  COMPARISON:  08/16/2014  FINDINGS: There is chronic cardiomegaly post CABG and mitral valve replacement. Aortic contours are stable  from prior. Pulmonary venous congestion and mild pulmonary edema with a large right pleural effusion. New circumscribed opacity in the right hilum, likely fissural fluid.  IMPRESSION: 1. Pulmonary edema with chronic moderate right pleural effusion. 2. A right hilar opacity is likely fissural fluid. When clinically able recommend two-view chest x-ray.   Electronically Signed   By: Monte Fantasia M.D.   On: 11/09/2014 10:01   Ct Perc Pleural Drain W/indwell Cath W/img Guide  11/10/2014   INDICATION: Recurrent symptomatic right-sided pleural effusion - as patient has multiple medical comorbidities and is a poor operative candidate, request made by consulting thoracic surgeon, Dr. Servando Snare, for placement of a CT-guided large bore catheter.  EXAM: CT PERC PLEURAL DRAIN W/INDWELL CATH W/IMG GUIDE  COMPARISON:  Chest CT - 11/09/2014; ultrasound-guided right-sided thoracentesis - 08/16/2014  MEDICATIONS: The patient is currently admitted to the hospital and receiving intravenous antibiotics. The antibiotics were administered within an appropriate time frame prior to the initiation of the procedure.  ANESTHESIA/SEDATION: Fentanyl 50 mcg IV; Versed 1 mg IV  Total Moderate Sedation time  22 minutes  CONTRAST:  None  COMPLICATIONS: None immediate  PROCEDURE: Informed written consent was obtained from the patient after a discussion of the risks, benefits and alternatives to treatment. The patient was placed supine, slightly LPO on the CT gantry and a pre procedural CT was performed re-demonstrating the known complex right-sided pleural effusion. The procedure was planned. A timeout was performed prior to the initiation of the procedure.  The right lateral chest was prepped and draped in the usual sterile fashion. The overlying soft tissues were anesthetized with 1% lidocaine with epinephrine. Appropriate trajectory was planned with the use of a 22 gauge spinal needle. An  18 gauge trocar needle was advanced into the  abscess/fluid collection and a short Amplatz super stiff wire was coiled within the collection. Appropriate positioning was confirmed with a limited CT scan. The tract was serially dilated allowing placement of a 14 French all-purpose drainage catheter. Appropriate positioning was confirmed with a limited postprocedural CT scan.  Approximately 1 L foul smelling brown fluid was aspirated from the right pleural space. Following removal for approximately 1 L of purulent material, limited CT scanning was performed demonstrating development of a small loculated ex vacuo pneumothorax within the right basilar pleural space. The tube was connected to a pleural vac and sutured in place. A dressing was placed. The patient tolerated the procedure well without immediate post procedural complication.  IMPRESSION: Successful CT guided placement of a 69 French all purpose drain catheter into the right pleural space with aspiration of 1 L of purulent, brown-colored fluid. Samples were sent to the laboratory as requested by the ordering clinical team.   Electronically Signed   By: Sandi Mariscal M.D.   On: 11/10/2014 16:23    CBC  Recent Labs Lab 11/14/14 0318 11/15/14 0535 11/16/14 0525 11/17/14 0536 11/18/14 0520  WBC 8.1 8.2 7.9 7.9 8.5  HGB 10.0* 9.8* 9.7* 9.5* 10.1*  HCT 32.1* 32.1* 31.4* 30.3* 32.9*  PLT 195 200 216 202 231  MCV 89.2 90.7 90.0 88.3 88.7  MCH 27.8 27.7 27.8 27.7 27.2  MCHC 31.2 30.5 30.9 31.4 30.7  RDW 16.1* 16.4* 16.4* 16.2* 16.2*    Chemistries   Recent Labs Lab 11/14/14 1438  NA 134*  K 3.9  CL 96  CO2 25  GLUCOSE 134*  BUN 24*  CREATININE 7.03*  CALCIUM 7.7*   ------------------------------------------------------------------------------------------------------------------ estimated creatinine clearance is 12 mL/min (by C-G formula based on Cr of 7.03). ------------------------------------------------------------------------------------------------------------------ No  results for input(s): HGBA1C in the last 72 hours. ------------------------------------------------------------------------------------------------------------------ No results for input(s): CHOL, HDL, LDLCALC, TRIG, CHOLHDL, LDLDIRECT in the last 72 hours. ------------------------------------------------------------------------------------------------------------------ No results for input(s): TSH, T4TOTAL, T3FREE, THYROIDAB in the last 72 hours.  Invalid input(s): FREET3 ------------------------------------------------------------------------------------------------------------------ No results for input(s): VITAMINB12, FOLATE, FERRITIN, TIBC, IRON, RETICCTPCT in the last 72 hours.  Coagulation profile  Recent Labs Lab 11/17/14 0536 11/18/14 0510  INR 1.46 1.40    No results for input(s): DDIMER in the last 72 hours.  Cardiac Enzymes No results for input(s): CKMB, TROPONINI, MYOGLOBIN in the last 168 hours.  Invalid input(s): CK ------------------------------------------------------------------------------------------------------------------ Invalid input(s): POCBNP    Amariona Rathje D.O. on 11/18/2014 at 3:08 PM  Between 7am to 7pm - Pager - [phone]  After 7pm go to [email or web address] - password [email or web address]  And look for the night coverage person covering for me after hours  [facility] Office  [phone]

## 2014-11-19 LAB — BASIC METABOLIC PANEL
ANION GAP: 11 (ref 5–15)
BUN: 20 mg/dL (ref 6–23)
CHLORIDE: 99 mmol/L (ref 96–112)
CO2: 27 mmol/L (ref 19–32)
Calcium: 8.2 mg/dL — ABNORMAL LOW (ref 8.4–10.5)
Creatinine, Ser: 6.32 mg/dL — ABNORMAL HIGH (ref 0.50–1.35)
GFR calc non Af Amer: 8 mL/min — ABNORMAL LOW (ref >90)
GFR, EST AFRICAN AMERICAN: 10 mL/min — AB (ref >90)
Glucose, Bld: 117 mg/dL — ABNORMAL HIGH (ref 70–99)
Potassium: 4 mmol/L (ref 3.5–5.1)
SODIUM: 137 mmol/L (ref 135–145)

## 2014-11-19 LAB — CBC
HCT: 29.8 % — ABNORMAL LOW (ref 39.0–52.0)
Hemoglobin: 9.3 g/dL — ABNORMAL LOW (ref 13.0–17.0)
MCH: 27.4 pg (ref 26.0–34.0)
MCHC: 31.2 g/dL (ref 30.0–36.0)
MCV: 87.9 fL (ref 78.0–100.0)
PLATELETS: 221 10*3/uL (ref 150–400)
RBC: 3.39 MIL/uL — AB (ref 4.22–5.81)
RDW: 16.2 % — ABNORMAL HIGH (ref 11.5–15.5)
WBC: 8.8 10*3/uL (ref 4.0–10.5)

## 2014-11-19 LAB — PROTIME-INR
INR: 1.48 (ref 0.00–1.49)
PROTHROMBIN TIME: 18.1 s — AB (ref 11.6–15.2)

## 2014-11-19 LAB — HEPARIN LEVEL (UNFRACTIONATED): Heparin Unfractionated: 0.67 IU/mL (ref 0.30–0.70)

## 2014-11-19 LAB — GLUCOSE, CAPILLARY
Glucose-Capillary: 81 mg/dL (ref 70–99)
Glucose-Capillary: 74 mg/dL (ref 70–99)

## 2014-11-19 MED ORDER — NEPRO/CARBSTEADY PO LIQD: 237.0000 mL | ORAL | Status: AC | PRN

## 2014-11-19 MED ORDER — LIDOCAINE-PRILOCAINE 2.5-2.5 % EX CREA
1.0000 | TOPICAL_CREAM | CUTANEOUS | Status: DC | PRN
Start: 2014-11-19 — End: 2014-11-19

## 2014-11-19 MED ORDER — METOPROLOL TARTRATE 25 MG PO TABS: 12.5000 mg | ORAL_TABLET | Freq: Two times a day (BID) | ORAL | Status: AC

## 2014-11-19 MED ORDER — WARFARIN SODIUM 2.5 MG PO TABS
2.5000 mg | ORAL_TABLET | Freq: Once | ORAL | Status: DC
  Filled 2014-11-19: qty 1

## 2014-11-19 MED ORDER — NEPRO/CARBSTEADY PO LIQD: 237.0000 mL | ORAL | Status: DC | PRN

## 2014-11-19 MED ORDER — LIDOCAINE HCL (PF) 1 % IJ SOLN: 5.0000 mL | INTRAMUSCULAR | Status: DC | PRN

## 2014-11-19 MED ORDER — HEPARIN SODIUM (PORCINE) 1000 UNIT/ML DIALYSIS
1000.0000 [IU] | INTRAMUSCULAR | Status: DC | PRN
  Filled 2014-11-19: qty 1

## 2014-11-19 MED ORDER — MIDODRINE HCL 5 MG PO TABS
ORAL_TABLET | ORAL | Status: AC
  Filled 2014-11-19: qty 2

## 2014-11-19 MED ORDER — ENOXAPARIN SODIUM 80 MG/0.8ML ~~LOC~~ SOLN: 80.0000 mg | SUBCUTANEOUS | Status: DC

## 2014-11-19 MED ORDER — SODIUM CHLORIDE 0.9 % IV SOLN: 100.0000 mL | INTRAVENOUS | Status: DC | PRN

## 2014-11-19 MED ORDER — ALTEPLASE 2 MG IJ SOLR
2.0000 mg | Freq: Once | INTRAMUSCULAR | Status: DC | PRN
  Filled 2014-11-19: qty 2

## 2014-11-19 MED ORDER — DARBEPOETIN ALFA 100 MCG/0.5ML IJ SOSY
PREFILLED_SYRINGE | INTRAMUSCULAR | Status: AC
  Filled 2014-11-19: qty 0.5

## 2014-11-19 MED ORDER — PENTAFLUOROPROP-TETRAFLUOROETH EX AERO: 1.0000 "application " | INHALATION_SPRAY | CUTANEOUS | Status: DC | PRN

## 2014-11-19 MED ORDER — ENOXAPARIN SODIUM 80 MG/0.8ML ~~LOC~~ SOLN
80.0000 mg | SUBCUTANEOUS | Status: DC
  Administered 2014-11-19: 80 mg via SUBCUTANEOUS
  Filled 2014-11-19: qty 0.8

## 2014-11-19 MED ORDER — WARFARIN SODIUM 2.5 MG PO TABS: 2.5000 mg | ORAL_TABLET | Freq: Once | ORAL | Status: DC

## 2014-11-19 NOTE — Discharge Summary (Signed)
Physician Discharge Summary  [name] [name] BHA:[id number] DOB: 1951-05-04 DOA: 11/09/2014  PCP: [name], MD  Admit date: 11/09/2014 Discharge date: 11/19/2014  Time spent: 45 minutes  Recommendations for Outpatient Follow-up:  Patient will be discharged to home with home health services, PT and RN.  Patient will need to have his INR checked 2 days of discharge on 11/20/2014 and 11/22/2014. These results should be sent to his primary care physician's office. Patient will be discharged with Lovenox bridge as well. Patient should continue his medications as prescribed. Patient should follow a heart healthy/carbo modified diet. Patient to continue his dialysis treatments as scheduled. Patient will need to follow-up with Dr. Servando Snare within 2-3 weeks and have repeat chest x-ray at that time. Patient wants me up with his primary care physician in one week of discharge.   Discharge Diagnoses:  Recurrent right pleural effusion Atrial fibrillation End-stage renal disease on hemodialysis Type 2 diabetes mellitus Anemia of chronic disease Questionable C. difficile  Discharge Condition: Stable  Diet recommendation: Heart healthy/renal/carb modified  Filed Weights   11/17/14 0850 11/18/14 0429 11/19/14 0723  Weight: 83 kg (182 lb 15.7 oz) 80.7 kg (177 lb 14.6 oz) 82.5 kg (181 lb 14.1 oz)    History of present illness:  on 11/09/2014 by Dr. Hurshel Party This is a 64 year old man who presents with a three-day history of increasing shortness of breath. He has a cough productive mainly of white sputum but occasionally yellow sputum. He denies any fever. There is no nausea, vomiting, abdominal pain. He has end-stage renal disease on hemodialysis and gets dialysis on Tuesdays, Thursdays and Saturdays. Evaluation in the emergency room showed him to have a large loculated right-sided pleural effusion. In December of last year he was admitted with what appeared to be a loculated effusion also on the  right side associated with pneumonia. At that time he had thoracentesis and was treated with antibiotics. He did well after this. He now returns with a recurrent right pleural effusion. He is going to be admitted for further management.  Hospital Course:  Recurrent right pleural effusion -Cardiothoracic surgery consulted and appreciated, patient did have chest tube in place which was removed 11/17/2014 -Chest x-ray 11/18/2014 shows a total change in right pleural effusion, volume loss on the right, probable loculated effusion creating opacity in the right midlung and right paratracheal region -Cause suspected to be secondary to previous bleed several months ago -Patient will need to follow-up with Dr. Servando Snare and have repeat CXR in 2-3 weeks  Atrial fibrillation -On chronic anticoagulation, INR currently subtherapeutic Coumadin recently restarted on 11/15/2014 -INR current 1.48 -Patient initially placed on IV heparin, however, will discharge with SQ lovenox daily (spoke with nephrology, Dr. Justin Mend) -Patient was trained on administration of lovenox.  -Continue amiodarone, metoprolol -Continue coumadin with lovenox, check INR on 3/25 and 3/27.  End-stage renal disease on hemodialysis -Nephrology consulted and appreciated  Type 2 diabetes mellitus -Continue home regimen at discharge  Anemia of chronic disease -Hemoglobin remaining stable  Question C. difficile culture -Possible C. difficile colonization, patient denies any diarrhea or loose stools at this time -Patient currently afebrile and no leukocytosis  Procedures  VATS Drainage of Right pleural effusion Chest tube placement 11/10/2014  Consults  Nephrology Interventional radiology Cardiothoracic surgery  Discharge Exam: Filed Vitals:   11/19/14 1100  BP: 112/62  Pulse: 50  Temp:   Resp:    Exam  General: Well developed, well nourished, NAD  Cardiovascular: S1 S2 auscultated, irregularly  irregular  Respiratory: Clear to auscultation  Extremities: warm dry without cyanosis clubbing or edema  Discharge Instructions      Discharge Instructions    Discharge instructions    Complete by:  As directed   Patient will be discharged to home with home health services, PT and RN.  Patient will need to have his INR checked 2 days of discharge on 11/20/2014 and 11/22/2014. These results should be sent to his primary care physician's office. Patient will be discharged with Lovenox bridge as well. Patient should continue his medications as prescribed. Patient should follow a heart healthy/carbo modified diet. Patient to continue his dialysis treatments as scheduled. Patient will need to follow-up with Dr. Servando Snare within 2-3 weeks and have repeat chest x-ray at that time. Patient wants me up with his primary care physician in one week of discharge.   You were cared for by a hospitalist during your hospital stay. If you have any questions about your discharge medications or the care you received while you were in the hospital after you are discharged, you can call the unit and asked to speak with the hospitalist on call if the hospitalist that took care of you is not available. Once you are discharged, your primary care physician will handle any further medical issues. Please note that NO REFILLS for any discharge medications will be authorized once you are discharged, as it is imperative that you return to your primary care physician (or establish a relationship with a primary care physician if you do not have one) for your aftercare needs so that they can reassess your need for medications and monitor your lab values.            Medication List    STOP taking these medications        folic acid 1 MG tablet  Commonly known as:  FOLVITE     warfarin 1 MG tablet  Commonly known as:  COUMADIN      TAKE these medications        albuterol (2.5 MG/3ML) 0.083% nebulizer solution  Commonly  known as:  PROVENTIL  Take 3 mLs (2.5 mg total) by nebulization every 2 (two) hours as needed for wheezing.     allopurinol 100 MG tablet  Commonly known as:  ZYLOPRIM  Take 100 mg by mouth daily.     amiodarone 200 MG tablet  Commonly known as:  PACERONE  Take 1 tablet (200 mg total) by mouth daily.     DIALYVITE/ZINC PO  Take by mouth 3 (three) times a week. Patient has dialysis on Tuesdays, Thursdays, and Saturdays     enoxaparin 80 MG/0.8ML injection  Commonly known as:  LOVENOX  Inject 0.8 mLs (80 mg total) into the skin daily.     feeding supplement (NEPRO CARB STEADY) Liqd  Take 237 mLs by mouth as needed (missed meal during dialysis.).     FLUoxetine 20 MG capsule  Commonly known as:  PROZAC  Take 20 mg by mouth daily.     HYDROcodone-acetaminophen 5-325 MG per tablet  Commonly known as:  NORCO/VICODIN  Take 1 tablet by mouth every 6 (six) hours as needed for moderate pain.     insulin glargine 100 UNIT/ML injection  Commonly known as:  LANTUS  Inject 10 Units into the skin at bedtime.     metoprolol tartrate 25 MG tablet  Commonly known as:  LOPRESSOR  Take 0.5 tablets (12.5 mg total) by mouth 2 (two) times daily.  midodrine 10 MG tablet  Commonly known as:  PROAMATINE  Take 1 tablet (10 mg total) by mouth 2 (two) times daily with a meal.     pantoprazole 40 MG tablet  Commonly known as:  PROTONIX  Take 40 mg by mouth 2 (two) times daily.     RENVELA 800 MG tablet  Generic drug:  sevelamer carbonate  Take 800 mg by mouth 3 (three) times daily with meals.     SENSIPAR 30 MG tablet  Generic drug:  cinacalcet  Take 30 mg by mouth daily.     traMADol 50 MG tablet  Commonly known as:  ULTRAM  Take by mouth every 8 (eight) hours as needed for moderate pain.     traZODone 50 MG tablet  Commonly known as:  DESYREL  Take 1 tablet (50 mg total) by mouth at bedtime.       Allergies  Allergen Reactions  . Bacitracin Other (See Comments)    unknown  .  Norvasc [Amlodipine Besylate] Other (See Comments)    unknown   Follow-up Information    Follow up with Grace Isaac, MD On 12/11/2014.   Specialty:  Cardiothoracic Surgery   Why:  Have a chest x-ray at [facility] at 9:00, then see MD at 10:00   Contact information:   [address] [place] [address] [phone]       Follow up with [name], MD. Schedule an appointment as soon as possible for a visit in 1 week.   Specialty:  Internal Medicine   Why:  Hospital follow up, INR monitoring   Contact information:   [address] [place] [address] [phone]        The results of significant diagnostics from this hospitalization (including imaging, microbiology, ancillary and laboratory) are listed below for reference.    Significant Diagnostic Studies: Dg Chest 2 View  11/18/2014   CLINICAL DATA:  Chest soreness, removal of chest to  EXAM: CHEST  2 VIEW  COMPARISON:  Portable chest x-ray of 11/16/2014, and CT chest of 11/08/2004  FINDINGS: There is little change in opacity at the right lung base consistent with right effusion. Hazy opacity in the right mid lung also persists which may represent loculated effusion. The patient is again somewhat rotated toward the right with somewhat prominent right paratracheal soft tissues, possibly due to loculated effusion as well. Cardiomegaly is stable. Minimal pulmonary vascular congestion cannot be excluded.  IMPRESSION: 1. Little change in right pleural effusion, volume loss on the right, and probable loculated effusion creating opacity in the right mid lung and right paratracheal region. 2. Cardiomegaly.  Question mild pulmonary vascular congestion. .   Electronically Signed   By: Ivar Drape M.D.   On: 11/18/2014 08:55   Dg Chest 2 View  11/12/2014   CLINICAL DATA:  Chest tube, essential benign hypertension, type 2 diabetes, coronary artery disease post CABG, atrial fibrillation, end-stage renal  disease  EXAM: CHEST  2 VIEW  COMPARISON:  Portable exam of 11/11/2014, chest CT 11/09/2014  FINDINGS: RIGHT basilar pigtail thoracostomy tube unchanged.  Enlargement of cardiac silhouette post MVR and CABG.  Pulmonary vascular congestion.  Persistent atelectasis of RIGHT upper and RIGHT lower lobes.  Persistent loculated RIGHT pleural effusion.  Probable mild pulmonary edema.  No pneumothorax.  Bones demineralized.  IMPRESSION: Enlargement of cardiac silhouette with pulmonary vascular congestion and probable while pulmonary edema.  Persistent loculated RIGHT pleural effusion and RIGHT lung atelectasis.   Electronically  Signed   By: Lavonia Dana M.D.   On: 11/12/2014 08:10   Dg Chest 2 View  11/09/2014   CLINICAL DATA:  Shortness of breath  EXAM: CHEST  2 VIEW  COMPARISON:  11/09/2014  FINDINGS: Unchanged cardiomegaly. Aortic and hilar contours or stable from prior. Patient is status post CABG and mitral valve replacement.  No interstitial coarsening with improved lung volumes. There is a moderate right pleural effusion with posterior loculation. The appearance is stable from two-view chest x-ray 08/13/2014, a peripherally enhancing loculated effusion based on abdominal CT 08/13/2014. Prominent right hilar opacity persists without evidence of hilar adenopathy or mass in the lateral projection. This appearance is likely from the pleural fluid.  Remote L1 compression fracture with advanced height loss. No acute osseous findings.  IMPRESSION: 1. Chronic, loculated right pleural effusion with atelectasis. 2. No evidence for pulmonary edema on this better inflated study.   Electronically Signed   By: Monte Fantasia M.D.   On: 11/09/2014 10:52   Ct Chest W Contrast  11/09/2014   CLINICAL DATA:  Shortness of breath.  Productive cough.  EXAM: CT CHEST WITH CONTRAST  TECHNIQUE: Multidetector CT imaging of the chest was performed during intravenous contrast administration.  CONTRAST:  25mL OMNIPAQUE IOHEXOL 300 MG/ML   SOLN  COMPARISON:  12/06/2011  FINDINGS: THORACIC INLET/BODY WALL:  No acute abnormality.  MEDIASTINUM:  Stable cardiomegaly, with especially prominent left atrial enlargement. The patient is status post mitral valve replacement and CABG. Portions of the LIMA and venous grafts which are large enough to evaluate are enhancing. No aortic aneurysm or dissection. No evidence of pulmonary embolism. Adenopathy stable to decreased from 2013, with right lower peritracheal and subcarinal nodes remaining the most prominent. A 15 mm metallic body (with the appearance of stent) within a right upper lobe pulmonary artery is stable from previous. There is no associated vessel occlusion.  LUNG WINDOWS:  Chronic loculated pleural effusion in the posterior and lower right chest with thick enhancing rim. The fluid collection is large, measuring 18 cm in craniocaudal span by 10 cm in thickness. The appearance is stable compared to visible portions on abdominal CT 08/13/2014. Based on abdominal CT favor remote hemothorax. Opacification along the chronic pleural effusion has heterogeneous enhancement, swirling interstitium, and subpleural rounded morphology. This is predominantly within the right lower lobe, although also present along the posterior right upper lobe and right middle lobe. There is small, loculated pleural effusion in the anterior and upper right hemi thorax without pleural nodularity or enhancement in this location. Trace layering left pleural effusion. Stable left lower lobe pulmonary nodule at 6 mm. Given stability from 2013 this is considered benign. Mild apical emphysema and air trapping.  UPPER ABDOMEN:  Hepatic fissure enlargement and subtle liver surface nodularity could reflect early cirrhosis. Cholelithiasis without evidence of acute cholecystitis.  OSSEOUS:  Remote L1 compression fracture with greater than 75% height loss. Remote left posterior rib fractures.  IMPRESSION: 1. Chronic large and complex right  pleural effusion, remote hemothorax based on abdominal CT 02/27/2014. 2. Collapsed and distorted right lower lobe secondary to #1, likely with developing rounded atelectasis. Cannot exclude superimposed infection. 3. Indeterminate, small loculated pleural effusion in the right upper chest. 4. Multiple incidental findings are stable from 2013 and noted above.   Electronically Signed   By: Monte Fantasia M.D.   On: 11/09/2014 12:47   Dg Chest Port 1 View  11/16/2014   CLINICAL DATA:  Pleural effusion.  Chest tube.  EXAM:  PORTABLE CHEST - 1 VIEW  COMPARISON:  11/14/2014.  CT 11/09/2014.  FINDINGS: Right chest tube in stable position. Prior CABG and mitral valve replacement. Stable cardiomegaly. No pulmonary venous congestion. Right base and right anterior medial pleural fluid collections are stable. Right base pleural fluid collection is improved significantly from prior CT of 11/09/2014 . Anterior medial fluid collection remains unchanged. No focal infiltrate or pneumothorax.  IMPRESSION: 1. Right chest tube in stable position. Right base and right anterior medial pleural fluid collections are stable. The right base pleural fluid collection has improved significantly from 11/09/2014. The right anterior medial pleural fluid collection is unchanged. 2. Prior CABG and mitral valve replacement. Stable cardiomegaly. No pulmonary venous congestion.   Electronically Signed   By: Marcello Moores  Register   On: 11/16/2014 07:20   Dg Chest Port 1 View  11/14/2014   CLINICAL DATA:  Loculated right pleural effusion, right chest tube  EXAM: PORTABLE CHEST - 1 VIEW  COMPARISON:  11/12/2014  FINDINGS: Stable right base pigtail chest tube. Stable appearance of the loculated right effusion including an anterior medial component extending along the right paratracheal region and hilum accounting for the chest x-ray abnormality. Prior coronary bypass and mitral valve replacement. Left lung remains clear. Persistent right lung atelectasis.  No enlarging pneumothorax.  IMPRESSION: Stable right base chest tube and loculated residual right pleural effusion.  Underlying right lung atelectasis.  No developing pneumothorax.   Electronically Signed   By: Jerilynn Mages.  Shick M.D.   On: 11/14/2014 10:00   Dg Chest Port 1 View  11/11/2014   CLINICAL DATA:  64 year old male with chest pain, pleural effusion. Initial encounter.  EXAM: PORTABLE CHEST - 1 VIEW  COMPARISON:  CT-guided chest tube placement images 31516 and earlier.  FINDINGS: Portable AP semi upright view at 1104 hours. Percutaneous pigtail right pleural drain remains in place with regressed thick walled pleural collection. Lower lung volumes compared to 11/09/2014. Stable cardiac size and mediastinal contours. No new cardiopulmonary abnormality identified.  IMPRESSION: Regressed right lung base pleural collection following percutaneous drainage catheter placement. No new cardiopulmonary abnormality identified.   Electronically Signed   By: Genevie Ann M.D.   On: 11/11/2014 11:54   Dg Chest Portable 1 View  11/09/2014   CLINICAL DATA:  Shortness of breath for 1 week.  Dialysis.  EXAM: PORTABLE CHEST - 1 VIEW  COMPARISON:  08/16/2014  FINDINGS: There is chronic cardiomegaly post CABG and mitral valve replacement. Aortic contours are stable from prior. Pulmonary venous congestion and mild pulmonary edema with a large right pleural effusion. New circumscribed opacity in the right hilum, likely fissural fluid.  IMPRESSION: 1. Pulmonary edema with chronic moderate right pleural effusion. 2. A right hilar opacity is likely fissural fluid. When clinically able recommend two-view chest x-ray.   Electronically Signed   By: Monte Fantasia M.D.   On: 11/09/2014 10:01   Ct Perc Pleural Drain W/indwell Cath W/img Guide  11/10/2014   INDICATION: Recurrent symptomatic right-sided pleural effusion - as patient has multiple medical comorbidities and is a poor operative candidate, request made by consulting thoracic  surgeon, Dr. Servando Snare, for placement of a CT-guided large bore catheter.  EXAM: CT PERC PLEURAL DRAIN W/INDWELL CATH W/IMG GUIDE  COMPARISON:  Chest CT - 11/09/2014; ultrasound-guided right-sided thoracentesis - 08/16/2014  MEDICATIONS: The patient is currently admitted to the hospital and receiving intravenous antibiotics. The antibiotics were administered within an appropriate time frame prior to the initiation of the procedure.  ANESTHESIA/SEDATION: Fentanyl 50 mcg IV;  Versed 1 mg IV  Total Moderate Sedation time  22 minutes  CONTRAST:  None  COMPLICATIONS: None immediate  PROCEDURE: Informed written consent was obtained from the patient after a discussion of the risks, benefits and alternatives to treatment. The patient was placed supine, slightly LPO on the CT gantry and a pre procedural CT was performed re-demonstrating the known complex right-sided pleural effusion. The procedure was planned. A timeout was performed prior to the initiation of the procedure.  The right lateral chest was prepped and draped in the usual sterile fashion. The overlying soft tissues were anesthetized with 1% lidocaine with epinephrine. Appropriate trajectory was planned with the use of a 22 gauge spinal needle. An 18 gauge trocar needle was advanced into the abscess/fluid collection and a short Amplatz super stiff wire was coiled within the collection. Appropriate positioning was confirmed with a limited CT scan. The tract was serially dilated allowing placement of a 14 French all-purpose drainage catheter. Appropriate positioning was confirmed with a limited postprocedural CT scan.  Approximately 1 L foul smelling brown fluid was aspirated from the right pleural space. Following removal for approximately 1 L of purulent material, limited CT scanning was performed demonstrating development of a small loculated ex vacuo pneumothorax within the right basilar pleural space. The tube was connected to a pleural vac and sutured in place.  A dressing was placed. The patient tolerated the procedure well without immediate post procedural complication.  IMPRESSION: Successful CT guided placement of a 39 French all purpose drain catheter into the right pleural space with aspiration of 1 L of purulent, brown-colored fluid. Samples were sent to the laboratory as requested by the ordering clinical team.   Electronically Signed   By: Sandi Mariscal M.D.   On: 11/10/2014 16:23    Microbiology: Recent Results (from the past 240 hour(s))  MRSA PCR Screening     Status: Abnormal   Collection Time: 11/09/14  7:07 PM  Result Value Ref Range Status   MRSA by PCR POSITIVE (A) NEGATIVE Final    Comment:        The GeneXpert MRSA Assay (FDA approved for NASAL specimens only), is one component of a comprehensive MRSA colonization surveillance program. It is not intended to diagnose MRSA infection nor to guide or monitor treatment for MRSA infections. RESULT CALLED TO, READ BACK BY AND VERIFIED WITH: SPRADLING,S RN 2243 11/09/14 MITCHELL,L   Culture, routine-abscess     Status: None   Collection Time: 11/10/14  2:38 PM  Result Value Ref Range Status   Specimen Description ABSCESS RIGHT PLEURAL  Final   Special Requests NONE  Final   Gram Stain   Final    ABUNDANT WBC PRESENT,BOTH PMN AND MONONUCLEAR NO SQUAMOUS EPITHELIAL CELLS SEEN NO ORGANISMS SEEN Performed at [facility]    Culture   Final    NO GROWTH 3 DAYS Performed at [facility]    Report Status 11/14/2014 FINAL  Final  Clostridium Difficile by PCR     Status: Abnormal   Collection Time: 11/15/14  6:40 AM  Result Value Ref Range Status   C difficile by pcr POSITIVE (A) NEGATIVE Final    Comment: CRITICAL RESULT CALLED TO, READ BACK BY AND VERIFIED WITH: D.MURPHY,RN 11/15/14 @1158  BY V.WILKINS      Labs: Basic Metabolic Panel:  Recent Labs Lab 11/14/14 1438 11/19/14 0446  NA 134* 137  K 3.9 4.0  CL 96 99  CO2 25 27  GLUCOSE 134* 117*  BUN  24* 20  CREATININE 7.03* 6.32*  CALCIUM 7.7* 8.2*  PHOS 3.2  --    Liver Function Tests:  Recent Labs Lab 11/14/14 1438  ALBUMIN 2.0*   No results for input(s): LIPASE, AMYLASE in the last 168 hours. No results for input(s): AMMONIA in the last 168 hours. CBC:  Recent Labs Lab 11/15/14 0535 11/16/14 0525 11/17/14 0536 11/18/14 0520 11/19/14 0446  WBC 8.2 7.9 7.9 8.5 8.8  HGB 9.8* 9.7* 9.5* 10.1* 9.3*  HCT 32.1* 31.4* 30.3* 32.9* 29.8*  MCV 90.7 90.0 88.3 88.7 87.9  PLT 200 216 202 231 221   Cardiac Enzymes: No results for input(s): CKTOTAL, CKMB, CKMBINDEX, TROPONINI in the last 168 hours. BNP: BNP (last 3 results) No results for input(s): BNP in the last 8760 hours.  ProBNP (last 3 results)  Recent Labs  08/15/14 0245  PROBNP >70000.0*    CBG:  Recent Labs Lab 11/18/14 0631 11/18/14 1136 11/18/14 1640 11/18/14 2131 11/19/14 0605  GLUCAP 125* 109* 144* 108* 81    Signed:  Aileen Amore  [facility] 11/19/2014, 11:15 AM

## 2014-11-19 NOTE — Progress Notes (Signed)
Patient discharged to home. Discharge instructions given to the patient and his wife both verbalized understanding of instructions given. Patient taken out to private vehicle via wheelchair. afleming RN

## 2014-11-19 NOTE — Progress Notes (Signed)
      [address]       [place] [address]             [phone]       Procedure(s) (LRB): VIDEO BRONCHOSCOPY (N/A) VIDEO ASSISTED THORACOSCOPY (Right) DRAINAGE OF PLEURAL EFFUSION (Right) PLEURAL BIOPSY (N/A)  Subjective:  No new complaints  Objective: Vital signs in last 24 hours: Temp:  [97.8 F (36.6 C)-98.1 F (36.7 C)] 98.1 F (36.7 C) (03/24 0723) Pulse Rate:  [40-108] 96 (03/24 0810) Cardiac Rhythm:  [-] Atrial fibrillation (03/24 0700) Resp:  [18-20] 20 (03/24 0810) BP: (95-140)/(51-100) 97/65 mmHg (03/24 0810) SpO2:  [95 %-98 %] 96 % (03/24 0810) Weight:  [181 lb 14.1 oz (82.5 kg)] 181 lb 14.1 oz (82.5 kg) (03/24 0723)  Intake/Output from previous day: 03/23 0701 - 03/24 0700 In: 120 [P.O.:120] Out: 3 [Stool:3]  General appearance: alert, cooperative and no distress Heart: irregularly irregular rhythm Lungs: diminished breath sounds on rihgt Abdomen: soft, non-tender; bowel sounds normal; no masses,  no organomegaly Wound: clean and dry  Lab Results:  Recent Labs  11/18/14 0520 11/19/14 0446  WBC 8.5 8.8  HGB 10.1* 9.3*  HCT 32.9* 29.8*  PLT 231 221   BMET:  Recent Labs  11/19/14 0446  NA 137  K 4.0  CL 99  CO2 27  GLUCOSE 117*  BUN 20  CREATININE 6.32*  CALCIUM 8.2*    PT/INR:  Recent Labs  11/19/14 0446  LABPROT 18.1*  INR 1.48   ABG    Component Value Date/Time   PHART 7.406 02/13/2014 2235   HCO3 22.4 02/13/2014 2235   TCO2 20.4 02/13/2014 2235   ACIDBASEDEF 1.6 02/13/2014 2235   O2SAT 89.9 02/13/2014 2235   CBG (last 3)   Recent Labs  11/18/14 1640 11/18/14 2131 11/19/14 0605  GLUCAP 144* 108* 81    Assessment/Plan: S/P Procedure(s) (LRB): VIDEO BRONCHOSCOPY (N/A) VIDEO ASSISTED THORACOSCOPY (Right) DRAINAGE OF PLEURAL EFFUSION (Right) PLEURAL BIOPSY (N/A)  1. Chronic Loculated pleural effusion on right- chest tube removed 3/22, CXR stable, follow up appointment in chart 2. Dispo-  patient did not have a VATS, despite what is documented in progress notes., care per primary   LOS: 10 days    Ellwood Handler 11/19/2014

## 2014-11-19 NOTE — Progress Notes (Signed)
Medicare Important Message given? YES  (If response is "NO", the following Medicare IM given date fields will be blank)  Date Medicare IM given: 11/19/14 Medicare IM given by:  Dahlia Client [employer]

## 2014-11-19 NOTE — Procedures (Signed)
I have seen and examined this patient and agree with the plan of care. Patient was seen on dialysis and has no current issues  Hoping to be discharged today [facility] 11/19/2014, 10:03 AM

## 2014-11-19 NOTE — Progress Notes (Signed)
ANTICOAGULATION CONSULT NOTE - Initial Consult  Pharmacy Consult for heparin/warfarin  Indication: atrial fibrillation  Allergies  Allergen Reactions  . Bacitracin Other (See Comments)    unknown  . Norvasc [Amlodipine Besylate] Other (See Comments)    unknown    Patient Measurements: Height: 6\' 4"  (193 cm) Weight: 181 lb 14.1 oz (82.5 kg) IBW/kg (Calculated) : 86.8 Heparin Dosing Weight: 83 kg  Vital Signs: Temp: 97.5 F (36.4 C) (03/24 1108) Temp Source: Oral (03/24 1108) BP: 112/62 mmHg (03/24 1100) Pulse Rate: 50 (03/24 1100)  Labs:  Recent Labs  11/17/14 0536 11/18/14 0510 11/18/14 0520 11/19/14 0446  HGB 9.5*  --  10.1* 9.3*  HCT 30.3*  --  32.9* 29.8*  PLT 202  --  231 221  LABPROT 17.9* 17.3*  --  18.1*  INR 1.46 1.40  --  1.48  HEPARINUNFRC 0.61 0.55  --  0.67  CREATININE  --   --   --  6.32*    Estimated Creatinine Clearance: 13.6 mL/min (by C-G formula based on Cr of 6.32).   Medical History: Past Medical History  Diagnosis Date  . UGI bleed 02/12/11    On anticoagulation; EGD w/snare polypectomy-multiple polypoid lesions antrum(benign), Chronic active gastritis (NEGATIVE H pylori)  . Anemia, chronic disease   . Sepsis due to enterococcus 02/11/11  . Atrial fibrillation     On coumadin  . Coronary atherosclerosis of native coronary artery     Multivessel status post CABG  . Cardiomyopathy   . Type 2 diabetes mellitus   . Essential hypertension, benign   . Hyperlipemia   . Gout   . S/P patent foramen ovale closure   . Hyperbilirubinemia 08/10/2013  . SBO (small bowel obstruction) 01/2014  . 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 Clostridium difficile colitis 01/2014  . Diverticulosis   . Internal hemorrhoids   . ESRD on hemodialysis     Started 2008-2009. Dr Hinda Lenis on a TTS schedule - R forearm AVF for several years      Medications:  Infusions:     Assessment: Pt on warfarin PTA for hx of AFib.  PTA warfarin dose: 0.5 mg  Tue/Thurs/Sat, 1 mg on all other days.  Note that pt was historically SUBtherapeutic on this dose. Continues on heparin gtt while inpatient, planning to transition to Lovenox today in anticipation of discharge. HL therapeutic, h/h 9.3/29, plts wnl, warfarin was resumed on 3/20, current INR 1.48.    Goal of Therapy:  INR 2-3 Heparin level 0.3-0.7 units/ml Monitor platelets by anticoagulation protocol: Yes   Plan:  -Stop heparin for 1 hour then give Lovneox -Lovenox 80 mg  daily  -Warfarin 2.5 mg po x1 -Daily INR, HL, CBC -Monitor for s/sx bleeding   Hughes Better, PharmD, BCPS Clinical Pharmacist Pager: [phone] 11/19/2014 11:19 AM

## 2014-11-20 ENCOUNTER — Telehealth: Payer: Self-pay | Admitting: Cardiology

## 2014-11-20 ENCOUNTER — Ambulatory Visit (INDEPENDENT_AMBULATORY_CARE_PROVIDER_SITE_OTHER): Payer: Medicare Other | Admitting: Pharmacist

## 2014-11-20 DIAGNOSIS — I482 Chronic atrial fibrillation, unspecified: Secondary | ICD-10-CM

## 2014-11-20 DIAGNOSIS — Z952 Presence of prosthetic heart valve: Secondary | ICD-10-CM

## 2014-11-20 DIAGNOSIS — Z954 Presence of other heart-valve replacement: Secondary | ICD-10-CM

## 2014-11-20 LAB — POCT INR: INR: 1.7

## 2014-11-20 NOTE — Telephone Encounter (Signed)
Patient was recently in [place] 1.7 Pt 20.1

## 2014-11-20 NOTE — Telephone Encounter (Signed)
INR addressed.  See anti-coag note for details.

## 2014-11-23 ENCOUNTER — Ambulatory Visit (INDEPENDENT_AMBULATORY_CARE_PROVIDER_SITE_OTHER): Payer: Medicare Other | Admitting: *Deleted

## 2014-11-23 DIAGNOSIS — I4891 Unspecified atrial fibrillation: Secondary | ICD-10-CM

## 2014-11-23 DIAGNOSIS — Z952 Presence of prosthetic heart valve: Secondary | ICD-10-CM

## 2014-11-23 DIAGNOSIS — Z954 Presence of other heart-valve replacement: Secondary | ICD-10-CM

## 2014-11-23 LAB — POCT INR: INR: 3.2

## 2014-11-26 ENCOUNTER — Ambulatory Visit (INDEPENDENT_AMBULATORY_CARE_PROVIDER_SITE_OTHER): Payer: Medicare Other | Admitting: *Deleted

## 2014-11-26 DIAGNOSIS — Z954 Presence of other heart-valve replacement: Secondary | ICD-10-CM

## 2014-11-26 DIAGNOSIS — Z952 Presence of prosthetic heart valve: Secondary | ICD-10-CM

## 2014-11-26 DIAGNOSIS — I480 Paroxysmal atrial fibrillation: Secondary | ICD-10-CM

## 2014-11-26 LAB — PROTIME-INR: INR: 5.3 — AB (ref 0.9–1.1)

## 2014-11-30 ENCOUNTER — Ambulatory Visit (INDEPENDENT_AMBULATORY_CARE_PROVIDER_SITE_OTHER): Payer: Medicare Other | Admitting: *Deleted

## 2014-11-30 DIAGNOSIS — I480 Paroxysmal atrial fibrillation: Secondary | ICD-10-CM

## 2014-11-30 DIAGNOSIS — Z954 Presence of other heart-valve replacement: Secondary | ICD-10-CM

## 2014-11-30 DIAGNOSIS — Z952 Presence of prosthetic heart valve: Secondary | ICD-10-CM

## 2014-11-30 LAB — POCT INR: INR: 4.1

## 2014-12-03 ENCOUNTER — Ambulatory Visit (INDEPENDENT_AMBULATORY_CARE_PROVIDER_SITE_OTHER): Payer: Medicare Other | Admitting: *Deleted

## 2014-12-03 ENCOUNTER — Ambulatory Visit: Payer: Medicare Other | Admitting: Cardiothoracic Surgery

## 2014-12-03 DIAGNOSIS — Z952 Presence of prosthetic heart valve: Secondary | ICD-10-CM

## 2014-12-03 DIAGNOSIS — I480 Paroxysmal atrial fibrillation: Secondary | ICD-10-CM

## 2014-12-03 DIAGNOSIS — Z954 Presence of other heart-valve replacement: Secondary | ICD-10-CM

## 2014-12-03 LAB — POCT INR: INR: 2.6

## 2014-12-08 ENCOUNTER — Encounter (HOSPITAL_COMMUNITY): Payer: Self-pay | Admitting: Emergency Medicine

## 2014-12-08 ENCOUNTER — Emergency Department (HOSPITAL_COMMUNITY): Payer: Medicare Other

## 2014-12-08 ENCOUNTER — Inpatient Hospital Stay (HOSPITAL_COMMUNITY)
Admission: EM | Admit: 2014-12-08 | Discharge: 2014-12-27 | DRG: 853 | Disposition: E | Payer: Medicare Other | Attending: Internal Medicine | Admitting: Internal Medicine

## 2014-12-08 DIAGNOSIS — I4891 Unspecified atrial fibrillation: Secondary | ICD-10-CM | POA: Diagnosis present

## 2014-12-08 DIAGNOSIS — E11621 Type 2 diabetes mellitus with foot ulcer: Secondary | ICD-10-CM | POA: Diagnosis not present

## 2014-12-08 DIAGNOSIS — Z953 Presence of xenogenic heart valve: Secondary | ICD-10-CM | POA: Diagnosis not present

## 2014-12-08 DIAGNOSIS — R791 Abnormal coagulation profile: Secondary | ICD-10-CM | POA: Diagnosis not present

## 2014-12-08 DIAGNOSIS — D638 Anemia in other chronic diseases classified elsewhere: Secondary | ICD-10-CM | POA: Diagnosis not present

## 2014-12-08 DIAGNOSIS — I255 Ischemic cardiomyopathy: Secondary | ICD-10-CM | POA: Diagnosis not present

## 2014-12-08 DIAGNOSIS — I4892 Unspecified atrial flutter: Secondary | ICD-10-CM | POA: Diagnosis not present

## 2014-12-08 DIAGNOSIS — I4510 Unspecified right bundle-branch block: Secondary | ICD-10-CM | POA: Diagnosis not present

## 2014-12-08 DIAGNOSIS — Z66 Do not resuscitate: Secondary | ICD-10-CM | POA: Diagnosis present

## 2014-12-08 DIAGNOSIS — Z515 Encounter for palliative care: Secondary | ICD-10-CM

## 2014-12-08 DIAGNOSIS — Z7901 Long term (current) use of anticoagulants: Secondary | ICD-10-CM | POA: Diagnosis not present

## 2014-12-08 DIAGNOSIS — I482 Chronic atrial fibrillation, unspecified: Secondary | ICD-10-CM | POA: Diagnosis present

## 2014-12-08 DIAGNOSIS — I5023 Acute on chronic systolic (congestive) heart failure: Secondary | ICD-10-CM | POA: Diagnosis present

## 2014-12-08 DIAGNOSIS — J90 Pleural effusion, not elsewhere classified: Secondary | ICD-10-CM | POA: Diagnosis present

## 2014-12-08 DIAGNOSIS — J9601 Acute respiratory failure with hypoxia: Secondary | ICD-10-CM | POA: Diagnosis not present

## 2014-12-08 DIAGNOSIS — N186 End stage renal disease: Secondary | ICD-10-CM | POA: Diagnosis present

## 2014-12-08 DIAGNOSIS — A419 Sepsis, unspecified organism: Principal | ICD-10-CM | POA: Diagnosis present

## 2014-12-08 DIAGNOSIS — I120 Hypertensive chronic kidney disease with stage 5 chronic kidney disease or end stage renal disease: Secondary | ICD-10-CM | POA: Diagnosis present

## 2014-12-08 DIAGNOSIS — Z87891 Personal history of nicotine dependence: Secondary | ICD-10-CM

## 2014-12-08 DIAGNOSIS — J189 Pneumonia, unspecified organism: Secondary | ICD-10-CM | POA: Diagnosis not present

## 2014-12-08 DIAGNOSIS — I891 Lymphangitis: Secondary | ICD-10-CM | POA: Diagnosis present

## 2014-12-08 DIAGNOSIS — A047 Enterocolitis due to Clostridium difficile: Secondary | ICD-10-CM | POA: Diagnosis present

## 2014-12-08 DIAGNOSIS — I469 Cardiac arrest, cause unspecified: Secondary | ICD-10-CM | POA: Diagnosis not present

## 2014-12-08 DIAGNOSIS — I420 Dilated cardiomyopathy: Secondary | ICD-10-CM | POA: Diagnosis present

## 2014-12-08 DIAGNOSIS — E1122 Type 2 diabetes mellitus with diabetic chronic kidney disease: Secondary | ICD-10-CM | POA: Diagnosis present

## 2014-12-08 DIAGNOSIS — J9690 Respiratory failure, unspecified, unspecified whether with hypoxia or hypercapnia: Secondary | ICD-10-CM

## 2014-12-08 DIAGNOSIS — Z794 Long term (current) use of insulin: Secondary | ICD-10-CM

## 2014-12-08 DIAGNOSIS — I739 Peripheral vascular disease, unspecified: Secondary | ICD-10-CM | POA: Diagnosis present

## 2014-12-08 DIAGNOSIS — I481 Persistent atrial fibrillation: Secondary | ICD-10-CM | POA: Diagnosis not present

## 2014-12-08 DIAGNOSIS — E1165 Type 2 diabetes mellitus with hyperglycemia: Secondary | ICD-10-CM | POA: Diagnosis present

## 2014-12-08 DIAGNOSIS — E876 Hypokalemia: Secondary | ICD-10-CM | POA: Diagnosis present

## 2014-12-08 DIAGNOSIS — E1121 Type 2 diabetes mellitus with diabetic nephropathy: Secondary | ICD-10-CM | POA: Diagnosis present

## 2014-12-08 DIAGNOSIS — I4819 Other persistent atrial fibrillation: Secondary | ICD-10-CM | POA: Diagnosis present

## 2014-12-08 DIAGNOSIS — G931 Anoxic brain damage, not elsewhere classified: Secondary | ICD-10-CM | POA: Diagnosis not present

## 2014-12-08 DIAGNOSIS — E872 Acidosis: Secondary | ICD-10-CM | POA: Diagnosis not present

## 2014-12-08 DIAGNOSIS — L03116 Cellulitis of left lower limb: Secondary | ICD-10-CM | POA: Diagnosis present

## 2014-12-08 DIAGNOSIS — I5022 Chronic systolic (congestive) heart failure: Secondary | ICD-10-CM | POA: Diagnosis not present

## 2014-12-08 DIAGNOSIS — E1140 Type 2 diabetes mellitus with diabetic neuropathy, unspecified: Secondary | ICD-10-CM | POA: Diagnosis present

## 2014-12-08 DIAGNOSIS — I951 Orthostatic hypotension: Secondary | ICD-10-CM | POA: Diagnosis not present

## 2014-12-08 DIAGNOSIS — N2581 Secondary hyperparathyroidism of renal origin: Secondary | ICD-10-CM | POA: Diagnosis present

## 2014-12-08 DIAGNOSIS — I70762 Atherosclerosis of other type of bypass graft(s) of the extremities with gangrene, left leg: Secondary | ICD-10-CM

## 2014-12-08 DIAGNOSIS — Z89421 Acquired absence of other right toe(s): Secondary | ICD-10-CM

## 2014-12-08 DIAGNOSIS — A0472 Enterocolitis due to Clostridium difficile, not specified as recurrent: Secondary | ICD-10-CM | POA: Diagnosis present

## 2014-12-08 DIAGNOSIS — I96 Gangrene, not elsewhere classified: Secondary | ICD-10-CM | POA: Diagnosis present

## 2014-12-08 DIAGNOSIS — Z79899 Other long term (current) drug therapy: Secondary | ICD-10-CM | POA: Diagnosis not present

## 2014-12-08 DIAGNOSIS — I429 Cardiomyopathy, unspecified: Secondary | ICD-10-CM

## 2014-12-08 DIAGNOSIS — R6521 Severe sepsis with septic shock: Secondary | ICD-10-CM | POA: Diagnosis present

## 2014-12-08 DIAGNOSIS — I483 Typical atrial flutter: Secondary | ICD-10-CM | POA: Diagnosis present

## 2014-12-08 DIAGNOSIS — I2510 Atherosclerotic heart disease of native coronary artery without angina pectoris: Secondary | ICD-10-CM | POA: Diagnosis present

## 2014-12-08 DIAGNOSIS — Y95 Nosocomial condition: Secondary | ICD-10-CM | POA: Diagnosis present

## 2014-12-08 DIAGNOSIS — Z89611 Acquired absence of right leg above knee: Secondary | ICD-10-CM | POA: Diagnosis not present

## 2014-12-08 DIAGNOSIS — Z951 Presence of aortocoronary bypass graft: Secondary | ICD-10-CM | POA: Diagnosis not present

## 2014-12-08 DIAGNOSIS — L97529 Non-pressure chronic ulcer of other part of left foot with unspecified severity: Secondary | ICD-10-CM | POA: Diagnosis not present

## 2014-12-08 DIAGNOSIS — R0902 Hypoxemia: Secondary | ICD-10-CM

## 2014-12-08 DIAGNOSIS — I509 Heart failure, unspecified: Secondary | ICD-10-CM

## 2014-12-08 DIAGNOSIS — I480 Paroxysmal atrial fibrillation: Secondary | ICD-10-CM | POA: Diagnosis present

## 2014-12-08 DIAGNOSIS — I4901 Ventricular fibrillation: Secondary | ICD-10-CM | POA: Diagnosis not present

## 2014-12-08 DIAGNOSIS — Z992 Dependence on renal dialysis: Secondary | ICD-10-CM

## 2014-12-08 DIAGNOSIS — I9589 Other hypotension: Secondary | ICD-10-CM | POA: Diagnosis not present

## 2014-12-08 DIAGNOSIS — T8203XA Leakage of heart valve prosthesis, initial encounter: Secondary | ICD-10-CM | POA: Diagnosis present

## 2014-12-08 DIAGNOSIS — J9600 Acute respiratory failure, unspecified whether with hypoxia or hypercapnia: Secondary | ICD-10-CM | POA: Diagnosis not present

## 2014-12-08 DIAGNOSIS — Z954 Presence of other heart-valve replacement: Secondary | ICD-10-CM | POA: Diagnosis not present

## 2014-12-08 DIAGNOSIS — L0291 Cutaneous abscess, unspecified: Secondary | ICD-10-CM | POA: Diagnosis present

## 2014-12-08 HISTORY — DX: Pleural effusion, not elsewhere classified: J90

## 2014-12-08 HISTORY — DX: Unspecified right bundle-branch block: I45.10

## 2014-12-08 HISTORY — DX: Orthostatic hypotension: I95.1

## 2014-12-08 HISTORY — DX: Presence of xenogenic heart valve: Z95.3

## 2014-12-08 HISTORY — DX: Chronic systolic (congestive) heart failure: I50.22

## 2014-12-08 HISTORY — DX: Paroxysmal atrial fibrillation: I48.0

## 2014-12-08 LAB — PROTIME-INR
INR: 1.68 — ABNORMAL HIGH (ref 0.00–1.49)
Prothrombin Time: 20 seconds — ABNORMAL HIGH (ref 11.6–15.2)

## 2014-12-08 LAB — CBC WITH DIFFERENTIAL/PLATELET
Basophils Absolute: 0 10*3/uL (ref 0.0–0.1)
Basophils Relative: 0 % (ref 0–1)
EOS ABS: 0.1 10*3/uL (ref 0.0–0.7)
Eosinophils Relative: 0 % (ref 0–5)
HCT: 31 % — ABNORMAL LOW (ref 39.0–52.0)
Hemoglobin: 9.7 g/dL — ABNORMAL LOW (ref 13.0–17.0)
LYMPHS ABS: 0.8 10*3/uL (ref 0.7–4.0)
Lymphocytes Relative: 5 % — ABNORMAL LOW (ref 12–46)
MCH: 27.8 pg (ref 26.0–34.0)
MCHC: 31.3 g/dL (ref 30.0–36.0)
MCV: 88.8 fL (ref 78.0–100.0)
MONOS PCT: 6 % (ref 3–12)
Monocytes Absolute: 1.1 10*3/uL — ABNORMAL HIGH (ref 0.1–1.0)
NEUTROS ABS: 15.7 10*3/uL — AB (ref 1.7–7.7)
NEUTROS PCT: 89 % — AB (ref 43–77)
PLATELETS: 333 10*3/uL (ref 150–400)
RBC: 3.49 MIL/uL — ABNORMAL LOW (ref 4.22–5.81)
RDW: 16.9 % — ABNORMAL HIGH (ref 11.5–15.5)
WBC: 17.6 10*3/uL — ABNORMAL HIGH (ref 4.0–10.5)

## 2014-12-08 LAB — COMPREHENSIVE METABOLIC PANEL
ALK PHOS: 155 U/L — AB (ref 39–117)
ALT: 28 U/L (ref 0–53)
AST: 57 U/L — ABNORMAL HIGH (ref 0–37)
Albumin: 2.3 g/dL — ABNORMAL LOW (ref 3.5–5.2)
Anion gap: 15 (ref 5–15)
BUN: 20 mg/dL (ref 6–23)
CO2: 26 mmol/L (ref 19–32)
Calcium: 7.9 mg/dL — ABNORMAL LOW (ref 8.4–10.5)
Chloride: 95 mmol/L — ABNORMAL LOW (ref 96–112)
Creatinine, Ser: 3.96 mg/dL — ABNORMAL HIGH (ref 0.50–1.35)
GFR calc Af Amer: 17 mL/min — ABNORMAL LOW (ref >90)
GFR calc non Af Amer: 15 mL/min — ABNORMAL LOW (ref >90)
GLUCOSE: 193 mg/dL — AB (ref 70–99)
POTASSIUM: 3.8 mmol/L (ref 3.5–5.1)
SODIUM: 136 mmol/L (ref 135–145)
TOTAL PROTEIN: 7.9 g/dL (ref 6.0–8.3)
Total Bilirubin: 4.5 mg/dL — ABNORMAL HIGH (ref 0.3–1.2)

## 2014-12-08 LAB — I-STAT CG4 LACTIC ACID, ED: Lactic Acid, Venous: 6.45 mmol/L (ref 0.5–2.0)

## 2014-12-08 LAB — GLUCOSE, CAPILLARY: Glucose-Capillary: 151 mg/dL — ABNORMAL HIGH (ref 70–99)

## 2014-12-08 MED ORDER — SODIUM CHLORIDE 0.9 % IV SOLN
INTRAVENOUS | Status: DC
  Administered 2014-12-08 – 2014-12-12 (×3): via INTRAVENOUS

## 2014-12-08 MED ORDER — ONDANSETRON HCL 4 MG/2ML IJ SOLN
4.0000 mg | Freq: Four times a day (QID) | INTRAMUSCULAR | Status: DC | PRN
  Administered 2014-12-11: 4 mg via INTRAVENOUS
  Filled 2014-12-08: qty 2

## 2014-12-08 MED ORDER — ALBUTEROL SULFATE (2.5 MG/3ML) 0.083% IN NEBU: 2.5000 mg | INHALATION_SOLUTION | RESPIRATORY_TRACT | Status: DC | PRN

## 2014-12-08 MED ORDER — METOPROLOL TARTRATE 25 MG PO TABS
12.5000 mg | ORAL_TABLET | Freq: Two times a day (BID) | ORAL | Status: DC
  Administered 2014-12-09 (×2): 12.5 mg via ORAL
  Filled 2014-12-08 (×2): qty 1

## 2014-12-08 MED ORDER — PIPERACILLIN-TAZOBACTAM IN DEX 2-0.25 GM/50ML IV SOLN
INTRAVENOUS | Status: AC
  Filled 2014-12-08: qty 100

## 2014-12-08 MED ORDER — VANCOMYCIN HCL IN DEXTROSE 1-5 GM/200ML-% IV SOLN
INTRAVENOUS | Status: AC
  Filled 2014-12-08: qty 200

## 2014-12-08 MED ORDER — FLUOXETINE HCL 20 MG PO CAPS
20.0000 mg | ORAL_CAPSULE | Freq: Every day | ORAL | Status: DC
  Administered 2014-12-08 – 2014-12-22 (×15): 20 mg via ORAL
  Filled 2014-12-08 (×16): qty 1

## 2014-12-08 MED ORDER — CINACALCET HCL 30 MG PO TABS
30.0000 mg | ORAL_TABLET | Freq: Every day | ORAL | Status: DC
  Administered 2014-12-09 – 2014-12-10 (×2): 30 mg via ORAL
  Filled 2014-12-08 (×3): qty 1

## 2014-12-08 MED ORDER — PIPERACILLIN-TAZOBACTAM 3.375 G IVPB 30 MIN
3.3750 g | Freq: Once | INTRAVENOUS | Status: AC
  Administered 2014-12-08: 3.375 g via INTRAVENOUS
  Filled 2014-12-08: qty 50

## 2014-12-08 MED ORDER — TRAZODONE HCL 50 MG PO TABS
50.0000 mg | ORAL_TABLET | Freq: Every day | ORAL | Status: DC
  Administered 2014-12-08 – 2014-12-20 (×11): 50 mg via ORAL
  Filled 2014-12-08 (×13): qty 1

## 2014-12-08 MED ORDER — PANTOPRAZOLE SODIUM 40 MG PO TBEC
40.0000 mg | DELAYED_RELEASE_TABLET | Freq: Two times a day (BID) | ORAL | Status: DC
  Administered 2014-12-08 – 2014-12-11 (×5): 40 mg via ORAL
  Filled 2014-12-08 (×5): qty 1

## 2014-12-08 MED ORDER — WARFARIN - PHARMACIST DOSING INPATIENT: Status: DC

## 2014-12-08 MED ORDER — DILTIAZEM HCL 25 MG/5ML IV SOLN
10.0000 mg | Freq: Once | INTRAVENOUS | Status: AC
  Administered 2014-12-08: 10 mg via INTRAVENOUS
  Filled 2014-12-08: qty 5

## 2014-12-08 MED ORDER — INSULIN ASPART 100 UNIT/ML ~~LOC~~ SOLN
0.0000 [IU] | Freq: Three times a day (TID) | SUBCUTANEOUS | Status: DC
  Administered 2014-12-09 – 2014-12-10 (×2): 1 [IU] via SUBCUTANEOUS
  Administered 2014-12-11 – 2014-12-13 (×2): 2 [IU] via SUBCUTANEOUS
  Administered 2014-12-13 – 2014-12-15 (×2): 1 [IU] via SUBCUTANEOUS
  Administered 2014-12-16: 2 [IU] via SUBCUTANEOUS

## 2014-12-08 MED ORDER — TETANUS-DIPHTH-ACELL PERTUSSIS 5-2.5-18.5 LF-MCG/0.5 IM SUSP
0.5000 mL | Freq: Once | INTRAMUSCULAR | Status: AC
  Administered 2014-12-08: 0.5 mL via INTRAMUSCULAR
  Filled 2014-12-08: qty 0.5

## 2014-12-08 MED ORDER — HYDROCODONE-ACETAMINOPHEN 5-325 MG PO TABS
1.0000 | ORAL_TABLET | Freq: Four times a day (QID) | ORAL | Status: DC | PRN
  Administered 2014-12-09: 1 via ORAL
  Filled 2014-12-08: qty 1

## 2014-12-08 MED ORDER — WARFARIN SODIUM 5 MG PO TABS: 2.5000 mg | ORAL_TABLET | Freq: Once | ORAL | Status: DC

## 2014-12-08 MED ORDER — FLUOXETINE HCL 20 MG PO CAPS: 20.0000 mg | ORAL_CAPSULE | Freq: Every day | ORAL | Status: DC

## 2014-12-08 MED ORDER — ALLOPURINOL 100 MG PO TABS
100.0000 mg | ORAL_TABLET | Freq: Every day | ORAL | Status: DC
  Administered 2014-12-08 – 2014-12-22 (×15): 100 mg via ORAL
  Filled 2014-12-08 (×16): qty 1

## 2014-12-08 MED ORDER — SEVELAMER CARBONATE 800 MG PO TABS
800.0000 mg | ORAL_TABLET | Freq: Three times a day (TID) | ORAL | Status: DC
  Administered 2014-12-09 – 2014-12-10 (×6): 800 mg via ORAL
  Filled 2014-12-08 (×6): qty 1

## 2014-12-08 MED ORDER — ONDANSETRON HCL 4 MG PO TABS: 4.0000 mg | ORAL_TABLET | Freq: Four times a day (QID) | ORAL | Status: DC | PRN

## 2014-12-08 MED ORDER — WARFARIN SODIUM 1 MG PO TABS
1.0000 mg | ORAL_TABLET | Freq: Once | ORAL | Status: AC
  Administered 2014-12-08: 1 mg via ORAL
  Filled 2014-12-08 (×2): qty 1

## 2014-12-08 MED ORDER — SODIUM CHLORIDE 0.9 % IJ SOLN
3.0000 mL | Freq: Two times a day (BID) | INTRAMUSCULAR | Status: DC
  Administered 2014-12-09 – 2014-12-15 (×8): 3 mL via INTRAVENOUS

## 2014-12-08 MED ORDER — VANCOMYCIN HCL IN DEXTROSE 1-5 GM/200ML-% IV SOLN
1000.0000 mg | Freq: Once | INTRAVENOUS | Status: AC
Start: 2014-12-08 — End: 2014-12-08
  Administered 2014-12-08: 1000 mg via INTRAVENOUS
  Filled 2014-12-08: qty 200

## 2014-12-08 MED ORDER — VANCOMYCIN HCL IN DEXTROSE 1-5 GM/200ML-% IV SOLN
1000.0000 mg | Freq: Once | INTRAVENOUS | Status: AC
  Administered 2014-12-08: 1000 mg via INTRAVENOUS
  Filled 2014-12-08: qty 200

## 2014-12-08 MED ORDER — ACETAMINOPHEN 500 MG PO TABS
1000.0000 mg | ORAL_TABLET | Freq: Once | ORAL | Status: AC
  Administered 2014-12-08: 1000 mg via ORAL
  Filled 2014-12-08: qty 2

## 2014-12-08 MED ORDER — DILTIAZEM HCL 100 MG IV SOLR
5.0000 mg/h | INTRAVENOUS | Status: DC
  Administered 2014-12-08 – 2014-12-09 (×3): 5 mg/h via INTRAVENOUS
  Filled 2014-12-08: qty 100

## 2014-12-08 MED ORDER — CETYLPYRIDINIUM CHLORIDE 0.05 % MT LIQD
7.0000 mL | Freq: Two times a day (BID) | OROMUCOSAL | Status: DC
  Administered 2014-12-08 – 2014-12-22 (×27): 7 mL via OROMUCOSAL

## 2014-12-08 MED ORDER — MIDODRINE HCL 5 MG PO TABS
10.0000 mg | ORAL_TABLET | Freq: Two times a day (BID) | ORAL | Status: DC
  Administered 2014-12-09 – 2014-12-11 (×6): 10 mg via ORAL
  Filled 2014-12-08 (×7): qty 2

## 2014-12-08 MED ORDER — INSULIN ASPART 100 UNIT/ML ~~LOC~~ SOLN: 0.0000 [IU] | Freq: Every day | SUBCUTANEOUS | Status: DC

## 2014-12-08 MED ORDER — VANCOMYCIN HCL IN DEXTROSE 1-5 GM/200ML-% IV SOLN
1000.0000 mg | INTRAVENOUS | Status: DC
  Administered 2014-12-10 – 2014-12-15 (×3): 1000 mg via INTRAVENOUS
  Filled 2014-12-08 (×5): qty 200

## 2014-12-08 MED ORDER — AMIODARONE HCL 200 MG PO TABS
200.0000 mg | ORAL_TABLET | Freq: Every day | ORAL | Status: DC
  Administered 2014-12-09: 200 mg via ORAL
  Filled 2014-12-08: qty 1

## 2014-12-08 MED ORDER — SODIUM CHLORIDE 0.9 % IV BOLUS (SEPSIS)
1000.0000 mL | INTRAVENOUS | Status: AC
  Administered 2014-12-08 (×3): 1000 mL via INTRAVENOUS

## 2014-12-08 MED ORDER — PIPERACILLIN-TAZOBACTAM IN DEX 2-0.25 GM/50ML IV SOLN
2.2500 g | Freq: Three times a day (TID) | INTRAVENOUS | Status: DC
  Administered 2014-12-08 – 2014-12-09 (×2): 2.25 g via INTRAVENOUS
  Filled 2014-12-08 (×6): qty 50

## 2014-12-08 MED ORDER — MIDODRINE HCL 5 MG PO TABS
10.0000 mg | ORAL_TABLET | Freq: Once | ORAL | Status: AC
  Administered 2014-12-08: 10 mg via ORAL
  Filled 2014-12-08: qty 2

## 2014-12-08 MED ORDER — INSULIN GLARGINE 100 UNIT/ML ~~LOC~~ SOLN
10.0000 [IU] | Freq: Every day | SUBCUTANEOUS | Status: DC
  Administered 2014-12-08 – 2014-12-11 (×3): 10 [IU] via SUBCUTANEOUS
  Filled 2014-12-08 (×6): qty 0.1

## 2014-12-08 NOTE — ED Notes (Signed)
Patient moved to room 2 for cardioversion and better visualization of patient.

## 2014-12-08 NOTE — ED Notes (Signed)
Hospitalist at bedside at this time 

## 2014-12-08 NOTE — ED Notes (Signed)
Assisted pt on bedpan, pt had BM, back in bed on  Monitor, MD aware of vitals

## 2014-12-08 NOTE — H&P (Signed)
[facility] History and Physical  [name] [name] ZOX:[id number] DOB: July 26, 1951 DOA: 12/03/2014  Referring physician: ER PCP: [name], MD   Chief Complaint: Left foot pain.  HPI: [name] [name] is a 64 y.o. male  This is a 64 year old man, diabetic, history of atrial fibrillation, who presents with pain at the middle foot in this toe which started proximally 4 days ago accompanied by pain on the plantar surface of the left foot. This has been going on for proximally one month in the plantar aspect of the left foot. He has had nausea for the last 2-3 days. The patient has end-stage renal disease and is on dialysis and did receive dialysis today. His symptoms have not been improving. He has no abdominal pain or vomiting. He has had a fever. Evaluation in the emergency room shows him to be in atrial fibrillation with rapid ventricular response and also fever and hypotension. He is being fluid resuscitated and is now being admitted for sepsis from his cellulitis.   Review of Systems:  Apart from symptoms above, all systems negative.  Past Medical History  Diagnosis Date  . UGI bleed 02/12/11    On anticoagulation; EGD w/snare polypectomy-multiple polypoid lesions antrum(benign), Chronic active gastritis (NEGATIVE H pylori)  . Anemia, chronic disease   . Sepsis due to enterococcus 02/11/11  . Atrial fibrillation     On coumadin  . Coronary atherosclerosis of native coronary artery     Multivessel status post CABG  . Cardiomyopathy   . Type 2 diabetes mellitus   . Essential hypertension, benign   . Hyperlipemia   . Gout   . S/P patent foramen ovale closure   . Hyperbilirubinemia 08/10/2013  . SBO (small bowel obstruction) 01/2014  . 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 Clostridium difficile colitis 01/2014  . Diverticulosis   . Internal hemorrhoids   . ESRD on hemodialysis     Started 2008-2009. Dr Hinda Lenis on a TTS schedule - R forearm AVF for several years     Past  Surgical History  Procedure Laterality Date  . Mitral valve replacement  01/05/2007    Bioprosthetic 24mm Edwards precordial tissue (Dr. Servando Snare)  . Coronary artery bypass graft  01/05/2007    LIMA-LAD, SVG-OM, seq. SVG-PDA, PLA-RCA  . Colonoscopy  06/23/2011    Dr. Oneida Alar: multiple sessile and pedunculated polyps, moderate diverticulosis, internal hemorrhoids, +ADENOMATOUS POLYPS, consider genetic testing, surveillance in October 2015   . Esophagogastroduodenoscopy  02/12/2011    CHRONIC ACTIVE GASTRITIS, NO H. pylori  . Transthoracic echocardiogram  11/2011    EF 25% w/ mod dilatation of LV & mod LVH; LA severely dilated; mod-severe dilation of RV with mod-severe decrease in RV function; mild MR & TR  . Cardioversion  04/19/2007    Dr. Marella Chimes  . Transthoracic echocardiogram  02/14/2011    EF 40-45%, mod conc LVH; ventricular septum with motion showing abnormal function, dyssynergy, paradox; mild AS; mild-mod MR stenosis; LA severely dilated; aptrial septum bowed from L to R with increased LA pressure   . Amputation Right 07/14/2013    Procedure: AMPUTATION DIGIT;  Surgeon: [name], MD;  Location: [facility];  Service: General;  Laterality: Right;  . Amputation Right 08/08/2013    Procedure: AMPUTATION BELOW KNEE;  Surgeon: [name], MD;  Location: [facility] ORS;  Service: General;  Laterality: Right;  . Stump revision Right 09/19/2013    Procedure: STUMP REVISION;  Surgeon: Scherry Ran, MD;  Location: [facility]  ORS;  Service: General;  Laterality: Right;  . Amputation Right 10/17/2013    Procedure: AMPUTATION ABOVE KNEE;  Surgeon: [name], MD;  Location: [facility] ORS;  Service: General;  Laterality: Right;  . Colonoscopy N/A 04/10/2014    Procedure: COLONOSCOPY;  Surgeon: [name], MD;  Location: [facility] ENDO SUITE;  Service: Endoscopy;  Laterality: N/A;  830  . Tee without cardioversion N/A 08/19/2014    Procedure: TRANSESOPHAGEAL ECHOCARDIOGRAM (TEE);  Surgeon: [name], MD;   Location[facility] ENDOSCOPY;  Service: Cardiovascular;  Laterality: N/A;  . Cardioversion N/A 08/19/2014    Procedure: CARDIOVERSION;  Surgeon: [name], MD;  Location: [facility] ENDOSCOPY;  Service: Cardiovascular;  Laterality: N/A;   Social History:  reports that he quit smoking about 8 years ago. His smoking use included Cigarettes. He started smoking about 45 years ago. He has a 8 pack-year smoking history. He has never used smokeless tobacco. He reports that he does not drink alcohol or use illicit drugs.  Allergies  Allergen Reactions  . Bacitracin Other (See Comments)    unknown  . Norvasc [Amlodipine Besylate] Other (See Comments)    unknown    Family History  Problem Relation Age of Onset  . Diabetes Mother   . Diabetes Father   . Diabetes Sister   . Colon cancer Neg Hx   . Colon polyps Neg Hx     Prior to Admission medications   Medication Sig Start Date End Date Taking? Authorizing Provider  albuterol (PROVENTIL) (2.5 MG/3ML) 0.083% nebulizer solution Take 3 mLs (2.5 mg total) by nebulization every 2 (two) hours as needed for wheezing. 02/21/14  Yes Kinnie Feil, MD  allopurinol (ZYLOPRIM) 100 MG tablet Take 100 mg by mouth daily.  05/15/11  Yes Historical Provider, MD  amiodarone (PACERONE) 200 MG tablet Take 1 tablet (200 mg total) by mouth daily. 08/31/14  Yes Satira Sark, MD  B Complex-C-Zn-Folic Acid (DIALYVITE/ZINC PO) Take by mouth 3 (three) times a week. Patient has dialysis on Tuesdays, Thursdays, and Saturdays   Yes Historical Provider, MD  FLUoxetine (PROZAC) 20 MG capsule Take 20 mg by mouth daily. 09/21/14  Yes Historical Provider, MD  HYDROcodone-acetaminophen (NORCO/VICODIN) 5-325 MG per tablet Take 1 tablet by mouth every 6 (six) hours as needed for moderate pain. 08/06/14  Yes Carole Civil, MD  insulin glargine (LANTUS) 100 UNIT/ML injection Inject 10 Units into the skin at bedtime.   Yes Historical Provider, MD  metoprolol tartrate (LOPRESSOR) 25 MG  tablet Take 0.5 tablets (12.5 mg total) by mouth 2 (two) times daily. 11/19/14  Yes Maryann Mikhail, DO  midodrine (PROAMATINE) 10 MG tablet Take 1 tablet (10 mg total) by mouth 2 (two) times daily with a meal. 08/20/14  Yes Silver Huguenin Elgergawy, MD  pantoprazole (PROTONIX) 40 MG tablet Take 40 mg by mouth 2 (two) times daily.  05/25/11  Yes Historical Provider, MD  RENVELA 800 MG tablet Take 800 mg by mouth 3 (three) times daily with meals.  05/03/11  Yes Historical Provider, MD  SENSIPAR 30 MG tablet Take 30 mg by mouth daily. 01/22/14  Yes Historical Provider, MD  traMADol (ULTRAM) 50 MG tablet Take by mouth every 8 (eight) hours as needed for moderate pain.   Yes Historical Provider, MD  traZODone (DESYREL) 50 MG tablet Take 1 tablet (50 mg total) by mouth at bedtime. 08/13/13  Yes Nita Sells, MD  warfarin (COUMADIN) 2.5 MG tablet Take 1 tablet (2.5 mg total) by mouth  one time only at 6 PM. 11/19/14  Yes Maryann Mikhail, DO  enoxaparin (LOVENOX) 80 MG/0.8ML injection Inject 0.8 mLs (80 mg total) into the skin daily. Patient not taking: Reported on 12/11/2014 11/19/14   Cristal Ford, DO  Nutritional Supplements (FEEDING SUPPLEMENT, NEPRO CARB STEADY,) LIQD Take 237 mLs by mouth as needed (missed meal during dialysis.). 11/19/14   Cristal Ford, DO   Physical Exam: Filed Vitals:   12/04/2014 1645 11/28/2014 1734 12/26/2014 1737 12/04/2014 1753  BP: 74/46  87/65 86/70  Pulse:  118 118 120  Temp:   98.8 F (37.1 C)   TempSrc:   Oral   Resp: 28 25 23 20   Height:      Weight:      SpO2:  100% 100% 96%    Wt Readings from Last 3 Encounters:  12/07/2014 90.719 kg (200 lb)  11/19/14 79.9 kg (176 lb 2.4 oz)  10/19/14 88.905 kg (196 lb)    General:  Appears calm and comfortable. This is surprising in view of his blood pressure. He does not actually look toxic or septic clinically. Eyes: PERRL, normal lids, irises & conjunctiva ENT: grossly normal hearing, lips & tongue Neck: no LAD, masses or  thyromegaly Cardiovascular: Irregularly irregular, consistent with atrial fibrillation. Telemetry: Atrial fibrillation with rapid ventricular response. Respiratory: CTA bilaterally, no w/r/r. Normal respiratory effort. Abdomen: soft, ntnd Skin: Cellulitis and involving the left foot and spreading to the left shin area. In particular his left middle toe appears to be gangrenous. There are several ulcers on the plantar aspect of the left foot also. Musculoskeletal: grossly normal tone BUE/BLE Psychiatric: grossly normal mood and affect, speech fluent and appropriate Neurologic: grossly non-focal.          Labs on Admission:  Basic Metabolic Panel:  Recent Labs Lab 12/14/2014 1415  NA 136  K 3.8  CL 95*  CO2 26  GLUCOSE 193*  BUN 20  CREATININE 3.96*  CALCIUM 7.9*   Liver Function Tests:  Recent Labs Lab 12/22/2014 1415  AST 57*  ALT 28  ALKPHOS 155*  BILITOT 4.5*  PROT 7.9  ALBUMIN 2.3*   No results for input(s): LIPASE, AMYLASE in the last 168 hours. No results for input(s): AMMONIA in the last 168 hours. CBC:  Recent Labs Lab 12/09/2014 1415  WBC 17.6*  NEUTROABS 15.7*  HGB 9.7*  HCT 31.0*  MCV 88.8  PLT 333   Cardiac Enzymes: No results for input(s): CKTOTAL, CKMB, CKMBINDEX, TROPONINI in the last 168 hours.  BNP (last 3 results) No results for input(s): BNP in the last 8760 hours.  ProBNP (last 3 results)  Recent Labs  08/15/14 0245  PROBNP >70000.0*    CBG: No results for input(s): GLUCAP in the last 168 hours.  Radiological Exams on Admission: Dg Chest Port 1 View  12/24/2014   CLINICAL DATA:  Renal failure ; anemia  EXAM: PORTABLE CHEST - 1 VIEW  COMPARISON:  November 18, 2014 chest radiograph and chest CT November 09, 2014  FINDINGS: There is extensive airspace consolidation and loculated effusion throughout much of the right hemithorax, stable. There is underlying generalized interstitial edema. There is cardiomegaly with pulmonary venous  hypertension. Patient is status post median sternotomy. No bone lesions are appreciable.  IMPRESSION: Underlying congestive heart failure. Extensive loculated effusion and airspace consolidation throughout much of the right lung. These findings were present previously and are not felt to be changed significantly.   Electronically Signed   By: Lowella Grip III M.D.  On: 12/09/2014 14:58   Dg Foot Complete Left  12/22/2014   CLINICAL DATA:  Left middle toe pain, dark area, patient on dialysis.  EXAM: LEFT FOOT - COMPLETE 3+ VIEW  COMPARISON:  None.  FINDINGS: Five views of left foot submitted. No acute fracture or subluxation. Diffuse osteopenia. Extensive atherosclerotic vascular calcifications. Plantar spur of calcaneus.  IMPRESSION: No acute fracture or subluxation. Diffuse osteopenia. Atherosclerotic vascular calcifications. Plantar spur of calcaneus.   Electronically Signed   By: Lahoma Crocker M.D.   On: 12/26/2014 14:58    EKG: Independently reviewed. Atrial fibrillation with rapid ventricular response. No acute ST-T wave changes.  Assessment/Plan   1. Cellulitis of the left foot with gangrene in the left middle toe. We will treat him with broad-spectrum antibiotics in view of his diabetes with intravenous vancomycin and intravenous Zosyn. We will ask surgery to see this patient. I'm concerned about the left middle toe, this might require amputation. 2. Atrial fibrillation with rapid ventricle response. This is probably reflective of his sepsis and we will fluid resuscitate him. I agree with small dose Cardizem drip to see if this will control his atrial fibrillation better. He is already on anticoagulation with Coumadin. Coumadin will be continued unless he requires surgery for his gangrenous left little toe. 3. End-stage renal disease on hemodialysis. We will ask nephrology to see the patient. 4. Recurrent right pleural effusion. His chest x-ray shows this appearance, which is chronic. He does  not actually have any respiratory symptoms today. 5. Diabetes. Continue with home insulin and sliding scale.  Further recommendations will depend on patient's hospital progress.   Code Status: Full code.  DVT Prophylaxis: On warfarin.  Family Communication: I discussed the plan with the patient at the bedside.  Disposition Plan: Depending on progress.  Time spent: 60 minutes.  Doree Albee [facility] Pager [phone].

## 2014-12-08 NOTE — Progress Notes (Signed)
ANTIBIOTIC CONSULT NOTE  Pharmacy Consult for Vancomycin and Zosyn  Indication: cellulitis   Allergies  Allergen Reactions  . Bacitracin Other (See Comments)    unknown  . Norvasc [Amlodipine Besylate] Other (See Comments)    unknown    Patient Measurements: Height: 6\' 3"  (190.5 cm) Weight: 200 lb (90.719 kg) IBW/kg (Calculated) : 84.5  Vital Signs: Temp: 98.8 F (37.1 C) (04/12 1737) Temp Source: Oral (04/12 1737) BP: 82/67 mmHg (04/12 1801) Pulse Rate: 125 (04/12 1801) Intake/Output from previous day:   Intake/Output from this shift: Total I/O In: 1050 [I.V.:1050] Out: -   Labs:  Recent Labs  12/15/2014 1415  WBC 17.6*  HGB 9.7*  PLT 333  CREATININE 3.96*   Estimated Creatinine Clearance: 22.2 mL/min (by C-G formula based on Cr of 3.96). No results for input(s): VANCOTROUGH, VANCOPEAK, VANCORANDOM, GENTTROUGH, GENTPEAK, GENTRANDOM, TOBRATROUGH, TOBRAPEAK, TOBRARND, AMIKACINPEAK, AMIKACINTROU, AMIKACIN in the last 72 hours.   Microbiology: Recent Results (from the past 720 hour(s))  MRSA PCR Screening     Status: Abnormal   Collection Time: 11/09/14  7:07 PM  Result Value Ref Range Status   MRSA by PCR POSITIVE (A) NEGATIVE Final    Comment:        The GeneXpert MRSA Assay (FDA approved for NASAL specimens only), is one component of a comprehensive MRSA colonization surveillance program. It is not intended to diagnose MRSA infection nor to guide or monitor treatment for MRSA infections. RESULT CALLED TO, READ BACK BY AND VERIFIED WITH: SPRADLING,S RN [id number] 11/09/14 MITCHELL,L   Culture, routine-abscess     Status: None   Collection Time: 11/10/14  2:38 PM  Result Value Ref Range Status   Specimen Description ABSCESS RIGHT PLEURAL  Final   Special Requests NONE  Final   Gram Stain   Final    ABUNDANT WBC PRESENT,BOTH PMN AND MONONUCLEAR NO SQUAMOUS EPITHELIAL CELLS SEEN NO ORGANISMS SEEN Performed at [facility]    Culture   Final    NO  GROWTH 3 DAYS Performed at [facility]    Report Status 11/14/2014 FINAL  Final  Clostridium Difficile by PCR     Status: Abnormal   Collection Time: 11/15/14  6:40 AM  Result Value Ref Range Status   C difficile by pcr POSITIVE (A) NEGATIVE Final    Comment: CRITICAL RESULT CALLED TO, READ BACK BY AND VERIFIED WITH: D.MURPHY,RN 11/15/14 @1158  BY V.WILKINS     Anti-infectives    Start     Dose/Rate Route Frequency Ordered Stop   12/07/2014 1430  piperacillin-tazobactam (ZOSYN) IVPB 3.375 g     3.375 g 100 mL/hr over 30 Minutes Intravenous  Once 12/16/2014 1428 12/13/2014 1514   12/11/2014 1430  vancomycin (VANCOCIN) IVPB 1000 mg/200 mL premix     1,000 mg 200 mL/hr over 60 Minutes Intravenous  Once 12/18/2014 1428 12/22/2014 1627      Assessment: Okay for Protocol, received initial doses in ED.  ESRD (HD completed today - outpatient).  DM patient w/ gangrenous left toe.  Goal of Therapy:  Eradicate infection. Pre-Hemodialysis Vancomycin level goal range =15-25 mcg/ml  Plan:  Zosyn 2.25gm IV every 8 hours. Follow-up micro data, labs, vitals. Additional 1gm IV Vancomycin today to complete loading dose and then 1gm IV every HD. Measure antibiotic drug levels at steady state Follow up culture results  [name] [name] 12/17/2014,6:12 PM

## 2014-12-08 NOTE — ED Notes (Signed)
Notice dark area to middle left toe.  Rates pain 8/10.  Took tylenol yesterday.  Pt is a right AKA.

## 2014-12-08 NOTE — ED Notes (Signed)
Dr. Jacubowitz at bedside 

## 2014-12-08 NOTE — ED Provider Notes (Addendum)
CSN: [id number]     Arrival date & time 12/11/2014  1351 History   First MD Initiated Contact with Patient 12/18/2014 1410     Chief Complaint  Patient presents with  . Toe Pain   Level V caveat unstable vital signs  (Consider location/radiation/quality/duration/timing/severity/associated sxs/prior Treatment) HPI Complains of pain at left middle toe onset 4 days ago, accompanied by pain on plantar surface of left foot for prostate one month, at site of ulcers on foot. Patient also complains of mild nausea for the past 2 or 3 days. . Nothing makes symptoms better or worse. No vomiting no abdominal pain no shortness of breath no cough no other associated symptoms. No treatment prior to coming here. Past Medical History  Diagnosis Date  . UGI bleed 02/12/11    On anticoagulation; EGD w/snare polypectomy-multiple polypoid lesions antrum(benign), Chronic active gastritis (NEGATIVE H pylori)  . Anemia, chronic disease   . Sepsis due to enterococcus 02/11/11  . Atrial fibrillation     On coumadin  . Coronary atherosclerosis of native coronary artery     Multivessel status post CABG  . Cardiomyopathy   . Type 2 diabetes mellitus   . Essential hypertension, benign   . Hyperlipemia   . Gout   . S/P patent foramen ovale closure   . Hyperbilirubinemia 08/10/2013  . SBO (small bowel obstruction) 01/2014  . 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 Clostridium difficile colitis 01/2014  . Diverticulosis   . Internal hemorrhoids   . ESRD on hemodialysis     Started 2008-2009. Dr Hinda Lenis on a TTS schedule - R forearm AVF for several years     Past Surgical History  Procedure Laterality Date  . Mitral valve replacement  01/05/2007    Bioprosthetic 33mm Edwards precordial tissue (Dr. Servando Snare)  . Coronary artery bypass graft  01/05/2007    LIMA-LAD, SVG-OM, seq. SVG-PDA, PLA-RCA  . Colonoscopy  06/23/2011    Dr. Oneida Alar: multiple sessile and pedunculated polyps, moderate diverticulosis, internal  hemorrhoids, +ADENOMATOUS POLYPS, consider genetic testing, surveillance in October 2015   . Esophagogastroduodenoscopy  02/12/2011    CHRONIC ACTIVE GASTRITIS, NO H. pylori  . Transthoracic echocardiogram  11/2011    EF 25% w/ mod dilatation of LV & mod LVH; LA severely dilated; mod-severe dilation of RV with mod-severe decrease in RV function; mild MR & TR  . Cardioversion  04/19/2007    Dr. Marella Chimes  . Transthoracic echocardiogram  02/14/2011    EF 40-45%, mod conc LVH; ventricular septum with motion showing abnormal function, dyssynergy, paradox; mild AS; mild-mod MR stenosis; LA severely dilated; aptrial septum bowed from L to R with increased LA pressure   . Amputation Right 07/14/2013    Procedure: AMPUTATION DIGIT;  Surgeon: [name], MD;  Location: [facility];  Service: General;  Laterality: Right;  . Amputation Right 08/08/2013    Procedure: AMPUTATION BELOW KNEE;  Surgeon: [name], MD;  Location: [facility];  Service: General;  Laterality: Right;  . Stump revision Right 09/19/2013    Procedure: STUMP REVISION;  Surgeon: Scherry Ran, MD;  Location: [facility];  Service: General;  Laterality: Right;  . Amputation Right 10/17/2013    Procedure: AMPUTATION ABOVE KNEE;  Surgeon: [name], MD;  Location: [facility];  Service: General;  Laterality: Right;  . Colonoscopy N/A 04/10/2014    Procedure: COLONOSCOPY;  Surgeon: [name], MD;  Location: [facility];  Service: Endoscopy;  Laterality: N/A;  830  .  Tee without cardioversion N/A 08/19/2014    Procedure: TRANSESOPHAGEAL ECHOCARDIOGRAM (TEE);  Surgeon: [name], MD;  Location: [facility] ENDOSCOPY;  Service: Cardiovascular;  Laterality: N/A;  . Cardioversion N/A 08/19/2014    Procedure: CARDIOVERSION;  Surgeon: [name], MD;  Location: [facility]COPY;  Service: Cardiovascular;  Laterality: N/A;   Family History  Problem Relation Age of Onset  . Diabetes Mother   . Diabetes Father   . Diabetes Sister   . Colon  cancer Neg Hx   . Colon polyps Neg Hx    History  Substance Use Topics  . Smoking status: Former Smoker -- 1.00 packs/day for 8 years    Types: Cigarettes    Start date: 04/27/1969    Quit date: 11/27/2006  . Smokeless tobacco: Never Used     Comment: quit about 5 yrs  . Alcohol Use: No    Review of Systems  Unable to perform ROS Gastrointestinal: Positive for nausea.  Genitourinary:       . Makes only minimal urine. Hemodialysis patient. Last had hemodialysis today. Completed entire course  Skin: Positive for wound.       Wounds on left foot   unable to perform complete review of systems, unstable vital signs    Allergies  Bacitracin and Norvasc  Home Medications   Prior to Admission medications   Medication Sig Start Date End Date Taking? Authorizing Provider  albuterol (PROVENTIL) (2.5 MG/3ML) 0.083% nebulizer solution Take 3 mLs (2.5 mg total) by nebulization every 2 (two) hours as needed for wheezing. 02/21/14   Kinnie Feil, MD  allopurinol (ZYLOPRIM) 100 MG tablet Take 100 mg by mouth daily.  05/15/11   Historical Provider, MD  amiodarone (PACERONE) 200 MG tablet Take 1 tablet (200 mg total) by mouth daily. 08/31/14   Satira Sark, MD  B Complex-C-Zn-Folic Acid (DIALYVITE/ZINC PO) Take by mouth 3 (three) times a week. Patient has dialysis on Tuesdays, Thursdays, and Saturdays    Historical Provider, MD  enoxaparin (LOVENOX) 80 MG/0.8ML injection Inject 0.8 mLs (80 mg total) into the skin daily. 11/19/14   Maryann Mikhail, DO  FLUoxetine (PROZAC) 20 MG capsule Take 20 mg by mouth daily. 09/21/14   Historical Provider, MD  HYDROcodone-acetaminophen (NORCO/VICODIN) 5-325 MG per tablet Take 1 tablet by mouth every 6 (six) hours as needed for moderate pain. 08/06/14   Carole Civil, MD  insulin glargine (LANTUS) 100 UNIT/ML injection Inject 10 Units into the skin at bedtime.    Historical Provider, MD  metoprolol tartrate (LOPRESSOR) 25 MG tablet Take 0.5 tablets  (12.5 mg total) by mouth 2 (two) times daily. 11/19/14   Maryann Mikhail, DO  midodrine (PROAMATINE) 10 MG tablet Take 1 tablet (10 mg total) by mouth 2 (two) times daily with a meal. 08/20/14   Albertine Patricia, MD  Nutritional Supplements (FEEDING SUPPLEMENT, NEPRO CARB STEADY,) LIQD Take 237 mLs by mouth as needed (missed meal during dialysis.). 11/19/14   Maryann Mikhail, DO  pantoprazole (PROTONIX) 40 MG tablet Take 40 mg by mouth 2 (two) times daily.  05/25/11   Historical Provider, MD  RENVELA 800 MG tablet Take 800 mg by mouth 3 (three) times daily with meals.  05/03/11   Historical Provider, MD  SENSIPAR 30 MG tablet Take 30 mg by mouth daily. 01/22/14   Historical Provider, MD  traMADol (ULTRAM) 50 MG tablet Take by mouth every 8 (eight) hours as needed for moderate pain.    Historical Provider, MD  traZODone (DESYREL)  50 MG tablet Take 1 tablet (50 mg total) by mouth at bedtime. 08/13/13   Nita Sells, MD  warfarin (COUMADIN) 2.5 MG tablet Take 1 tablet (2.5 mg total) by mouth one time only at 6 PM. 11/19/14   Maryann Mikhail, DO   BP 95/60 mmHg  Pulse 147  Temp(Src) 99.4 F (37.4 C) (Oral)  Resp 16  Ht 6\' 3"  (1.905 m)  Wt 200 lb (90.719 kg)  BMI 25.00 kg/m2  SpO2 97% Physical Exam  Constitutional: No distress.  Chronically ill-appearing  HENT:  Head: Normocephalic and atraumatic.  Eyes: Conjunctivae are normal. Pupils are equal, round, and reactive to light.  Neck: Neck supple. No tracheal deviation present. No thyromegaly present.  Cardiovascular:  No murmur heard. Tachycardic irregularly irregular  Pulmonary/Chest: Effort normal and breath sounds normal.  Abdominal: Soft. Bowel sounds are normal. He exhibits no distension. There is no tenderness.  Musculoskeletal: Normal range of motion. He exhibits no edema or tenderness.  Right upper extremity with dialysis fistula with good thrill right lower extremity with AKA stump is clean and nontender. Right lower extremity  trace pedal edema. Dorsum of foot with dime-sized open wound at at third toe. Toe is hyperpigmented. There are 3 dime-sized ulcers which are clean-appearing on the plantar surface of the foot. Foot is diffusely reddened. Redness extends two thirds of the way up the shin. No inguinal nodes. DP and PT pulses are absent however for is warm. All other extremities without redness swelling or tenderness   Neurological: He is alert. Coordination normal.  Skin: Skin is warm and dry. No rash noted.  Psychiatric: He has a normal mood and affect.  Nursing note and vitals reviewed.   ED Course  Procedures (including critical care time) Labs Review Labs Reviewed - No data to display Code sepsis called based on fever, pulse, source being cellulitis of left lower extremity Imaging Review No results found.   EKG Interpretation   Date/Time:  Tuesday December 08 2014 14:11:44 EDT Ventricular Rate:  147 PR Interval:    QRS Duration: 114 QT Interval:  351 QTC Calculation: 549 R Axis:   -101 Text Interpretation:  Atrial fibrillation Probable lateral infarct, age  indeterminate Anterior infarct, old Prolonged QT interval SINCE LAST  TRACING HEART RATE HAS INCREASED Confirmed by Winfred Leeds  MD, Damariz Paganelli [phone])  on 12/18/2014 2:33:48 PM     3:30 PM patient alert talkative appears comfortable GCS =15  4:30 PM patient alert talkative no distress after treatment with intravenous fluids, 2 L normal saline infused and intravenous antibiotics. Denies dyspnea. Remains tachycardic  5:15 PM patient administered Cardizem 10 mg intravenous bolus, as his heart rate did not diminish after treatment with intravenous fluids 30 mL/kg and Tylenol. He remained talkative alert and in no distress. Infusion. He continues to deny shortness of breath. He is lying flat without dyspnea. Cardizem intravenous drip ordered by me at 5 mg per hour. Results for orders placed or performed during the hospital encounter of 12/12/2014  CBC WITH  DIFFERENTIAL  Result Value Ref Range   WBC 17.6 (H) 4.0 - 10.5 K/uL   RBC 3.49 (L) 4.22 - 5.81 MIL/uL   Hemoglobin 9.7 (L) 13.0 - 17.0 g/dL   HCT 31.0 (L) 39.0 - 52.0 %   MCV 88.8 78.0 - 100.0 fL   MCH 27.8 26.0 - 34.0 pg   MCHC 31.3 30.0 - 36.0 g/dL   RDW 16.9 (H) 11.5 - 15.5 %   Platelets 333 150 - 400 K/uL  Neutrophils Relative % 89 (H) 43 - 77 %   Neutro Abs 15.7 (H) 1.7 - 7.7 K/uL   Lymphocytes Relative 5 (L) 12 - 46 %   Lymphs Abs 0.8 0.7 - 4.0 K/uL   Monocytes Relative 6 3 - 12 %   Monocytes Absolute 1.1 (H) 0.1 - 1.0 K/uL   Eosinophils Relative 0 0 - 5 %   Eosinophils Absolute 0.1 0.0 - 0.7 K/uL   Basophils Relative 0 0 - 1 %   Basophils Absolute 0.0 0.0 - 0.1 K/uL  Comprehensive metabolic panel  Result Value Ref Range   Sodium 136 135 - 145 mmol/L   Potassium 3.8 3.5 - 5.1 mmol/L   Chloride 95 (L) 96 - 112 mmol/L   CO2 26 19 - 32 mmol/L   Glucose, Bld 193 (H) 70 - 99 mg/dL   BUN 20 6 - 23 mg/dL   Creatinine, Ser 3.96 (H) 0.50 - 1.35 mg/dL   Calcium 7.9 (L) 8.4 - 10.5 mg/dL   Total Protein 7.9 6.0 - 8.3 g/dL   Albumin 2.3 (L) 3.5 - 5.2 g/dL   AST 57 (H) 0 - 37 U/L   ALT 28 0 - 53 U/L   Alkaline Phosphatase 155 (H) 39 - 117 U/L   Total Bilirubin 4.5 (H) 0.3 - 1.2 mg/dL   GFR calc non Af Amer 15 (L) >90 mL/min   GFR calc Af Amer 17 (L) >90 mL/min   Anion gap 15 5 - 15  Protime-INR  Result Value Ref Range   Prothrombin Time 20.0 (H) 11.6 - 15.2 seconds   INR 1.68 (H) 0.00 - 1.49  I-Stat CG4 Lactic Acid, ED (not at [facility])  Result Value Ref Range   Lactic Acid, Venous 6.45 (HH) 0.5 - 2.0 mmol/L   Comment NOTIFIED PHYSICIAN    Dg Chest 2 View  11/18/2014   CLINICAL DATA:  Chest soreness, removal of chest to  EXAM: CHEST  2 VIEW  COMPARISON:  Portable chest x-ray of 11/16/2014, and CT chest of 11/08/2004  FINDINGS: There is little change in opacity at the right lung base consistent with right effusion. Hazy opacity in the right mid lung also persists which may  represent loculated effusion. The patient is again somewhat rotated toward the right with somewhat prominent right paratracheal soft tissues, possibly due to loculated effusion as well. Cardiomegaly is stable. Minimal pulmonary vascular congestion cannot be excluded.  IMPRESSION: 1. Little change in right pleural effusion, volume loss on the right, and probable loculated effusion creating opacity in the right mid lung and right paratracheal region. 2. Cardiomegaly.  Question mild pulmonary vascular congestion. .   Electronically Signed   By: Ivar Drape M.D.   On: 11/18/2014 08:55   Dg Chest 2 View  11/12/2014   CLINICAL DATA:  Chest tube, essential benign hypertension, type 2 diabetes, coronary artery disease post CABG, atrial fibrillation, end-stage renal disease  EXAM: CHEST  2 VIEW  COMPARISON:  Portable exam of 11/11/2014, chest CT 11/09/2014  FINDINGS: RIGHT basilar pigtail thoracostomy tube unchanged.  Enlargement of cardiac silhouette post MVR and CABG.  Pulmonary vascular congestion.  Persistent atelectasis of RIGHT upper and RIGHT lower lobes.  Persistent loculated RIGHT pleural effusion.  Probable mild pulmonary edema.  No pneumothorax.  Bones demineralized.  IMPRESSION: Enlargement of cardiac silhouette with pulmonary vascular congestion and probable while pulmonary edema.  Persistent loculated RIGHT pleural effusion and RIGHT lung atelectasis.   Electronically Signed   By: Crist Infante.D.  On: 11/12/2014 08:10   Dg Chest 2 View  11/09/2014   CLINICAL DATA:  Shortness of breath  EXAM: CHEST  2 VIEW  COMPARISON:  11/09/2014  FINDINGS: Unchanged cardiomegaly. Aortic and hilar contours or stable from prior. Patient is status post CABG and mitral valve replacement.  No interstitial coarsening with improved lung volumes. There is a moderate right pleural effusion with posterior loculation. The appearance is stable from two-view chest x-ray 08/13/2014, a peripherally enhancing loculated effusion based  on abdominal CT 08/13/2014. Prominent right hilar opacity persists without evidence of hilar adenopathy or mass in the lateral projection. This appearance is likely from the pleural fluid.  Remote L1 compression fracture with advanced height loss. No acute osseous findings.  IMPRESSION: 1. Chronic, loculated right pleural effusion with atelectasis. 2. No evidence for pulmonary edema on this better inflated study.   Electronically Signed   By: Monte Fantasia M.D.   On: 11/09/2014 10:52   Ct Chest W Contrast  11/09/2014   CLINICAL DATA:  Shortness of breath.  Productive cough.  EXAM: CT CHEST WITH CONTRAST  TECHNIQUE: Multidetector CT imaging of the chest was performed during intravenous contrast administration.  CONTRAST:  73mL OMNIPAQUE IOHEXOL 300 MG/ML  SOLN  COMPARISON:  12/06/2011  FINDINGS: THORACIC INLET/BODY WALL:  No acute abnormality.  MEDIASTINUM:  Stable cardiomegaly, with especially prominent left atrial enlargement. The patient is status post mitral valve replacement and CABG. Portions of the LIMA and venous grafts which are large enough to evaluate are enhancing. No aortic aneurysm or dissection. No evidence of pulmonary embolism. Adenopathy stable to decreased from 2013, with right lower peritracheal and subcarinal nodes remaining the most prominent. A 15 mm metallic body (with the appearance of stent) within a right upper lobe pulmonary artery is stable from previous. There is no associated vessel occlusion.  LUNG WINDOWS:  Chronic loculated pleural effusion in the posterior and lower right chest with thick enhancing rim. The fluid collection is large, measuring 18 cm in craniocaudal span by 10 cm in thickness. The appearance is stable compared to visible portions on abdominal CT 08/13/2014. Based on abdominal CT favor remote hemothorax. Opacification along the chronic pleural effusion has heterogeneous enhancement, swirling interstitium, and subpleural rounded morphology. This is predominantly  within the right lower lobe, although also present along the posterior right upper lobe and right middle lobe. There is small, loculated pleural effusion in the anterior and upper right hemi thorax without pleural nodularity or enhancement in this location. Trace layering left pleural effusion. Stable left lower lobe pulmonary nodule at 6 mm. Given stability from 2013 this is considered benign. Mild apical emphysema and air trapping.  UPPER ABDOMEN:  Hepatic fissure enlargement and subtle liver surface nodularity could reflect early cirrhosis. Cholelithiasis without evidence of acute cholecystitis.  OSSEOUS:  Remote L1 compression fracture with greater than 75% height loss. Remote left posterior rib fractures.  IMPRESSION: 1. Chronic large and complex right pleural effusion, remote hemothorax based on abdominal CT 02/27/2014. 2. Collapsed and distorted right lower lobe secondary to #1, likely with developing rounded atelectasis. Cannot exclude superimposed infection. 3. Indeterminate, small loculated pleural effusion in the right upper chest. 4. Multiple incidental findings are stable from 2013 and noted above.   Electronically Signed   By: Monte Fantasia M.D.   On: 11/09/2014 12:47   Dg Chest Port 1 View  12/21/2014   CLINICAL DATA:  Renal failure ; anemia  EXAM: PORTABLE CHEST - 1 VIEW  COMPARISON:  November 18, 2014  chest radiograph and chest CT November 09, 2014  FINDINGS: There is extensive airspace consolidation and loculated effusion throughout much of the right hemithorax, stable. There is underlying generalized interstitial edema. There is cardiomegaly with pulmonary venous hypertension. Patient is status post median sternotomy. No bone lesions are appreciable.  IMPRESSION: Underlying congestive heart failure. Extensive loculated effusion and airspace consolidation throughout much of the right lung. These findings were present previously and are not felt to be changed significantly.   Electronically Signed    By: Lowella Grip III M.D.   On: 12/15/2014 14:58   Dg Chest Port 1 View  11/16/2014   CLINICAL DATA:  Pleural effusion.  Chest tube.  EXAM: PORTABLE CHEST - 1 VIEW  COMPARISON:  11/14/2014.  CT 11/09/2014.  FINDINGS: Right chest tube in stable position. Prior CABG and mitral valve replacement. Stable cardiomegaly. No pulmonary venous congestion. Right base and right anterior medial pleural fluid collections are stable. Right base pleural fluid collection is improved significantly from prior CT of 11/09/2014 . Anterior medial fluid collection remains unchanged. No focal infiltrate or pneumothorax.  IMPRESSION: 1. Right chest tube in stable position. Right base and right anterior medial pleural fluid collections are stable. The right base pleural fluid collection has improved significantly from 11/09/2014. The right anterior medial pleural fluid collection is unchanged. 2. Prior CABG and mitral valve replacement. Stable cardiomegaly. No pulmonary venous congestion.   Electronically Signed   By: Marcello Moores  Register   On: 11/16/2014 07:20   Dg Chest Port 1 View  11/14/2014   CLINICAL DATA:  Loculated right pleural effusion, right chest tube  EXAM: PORTABLE CHEST - 1 VIEW  COMPARISON:  11/12/2014  FINDINGS: Stable right base pigtail chest tube. Stable appearance of the loculated right effusion including an anterior medial component extending along the right paratracheal region and hilum accounting for the chest x-ray abnormality. Prior coronary bypass and mitral valve replacement. Left lung remains clear. Persistent right lung atelectasis. No enlarging pneumothorax.  IMPRESSION: Stable right base chest tube and loculated residual right pleural effusion.  Underlying right lung atelectasis.  No developing pneumothorax.   Electronically Signed   By: Jerilynn Mages.  Shick M.D.   On: 11/14/2014 10:00   Dg Chest Port 1 View  11/11/2014   CLINICAL DATA:  64 year old male with chest pain, pleural effusion. Initial encounter.   EXAM: PORTABLE CHEST - 1 VIEW  COMPARISON:  CT-guided chest tube placement images 31516 and earlier.  FINDINGS: Portable AP semi upright view at 1104 hours. Percutaneous pigtail right pleural drain remains in place with regressed thick walled pleural collection. Lower lung volumes compared to 11/09/2014. Stable cardiac size and mediastinal contours. No new cardiopulmonary abnormality identified.  IMPRESSION: Regressed right lung base pleural collection following percutaneous drainage catheter placement. No new cardiopulmonary abnormality identified.   Electronically Signed   By: Genevie Ann M.D.   On: 11/11/2014 11:54   Dg Chest Portable 1 View  11/09/2014   CLINICAL DATA:  Shortness of breath for 1 week.  Dialysis.  EXAM: PORTABLE CHEST - 1 VIEW  COMPARISON:  08/16/2014  FINDINGS: There is chronic cardiomegaly post CABG and mitral valve replacement. Aortic contours are stable from prior. Pulmonary venous congestion and mild pulmonary edema with a large right pleural effusion. New circumscribed opacity in the right hilum, likely fissural fluid.  IMPRESSION: 1. Pulmonary edema with chronic moderate right pleural effusion. 2. A right hilar opacity is likely fissural fluid. When clinically able recommend two-view chest x-ray.   Electronically Signed  By: Monte Fantasia M.D.   On: 11/09/2014 10:01   Dg Foot Complete Left  12/18/2014   CLINICAL DATA:  Left middle toe pain, dark area, patient on dialysis.  EXAM: LEFT FOOT - COMPLETE 3+ VIEW  COMPARISON:  None.  FINDINGS: Five views of left foot submitted. No acute fracture or subluxation. Diffuse osteopenia. Extensive atherosclerotic vascular calcifications. Plantar spur of calcaneus.  IMPRESSION: No acute fracture or subluxation. Diffuse osteopenia. Atherosclerotic vascular calcifications. Plantar spur of calcaneus.   Electronically Signed   By: Lahoma Crocker M.D.   On: 12/20/2014 14:58   Ct Perc Pleural Drain W/indwell Cath W/img Guide  11/10/2014   INDICATION:  Recurrent symptomatic right-sided pleural effusion - as patient has multiple medical comorbidities and is a poor operative candidate, request made by consulting thoracic surgeon, Dr. Servando Snare, for placement of a CT-guided large bore catheter.  EXAM: CT PERC PLEURAL DRAIN W/INDWELL CATH W/IMG GUIDE  COMPARISON:  Chest CT - 11/09/2014; ultrasound-guided right-sided thoracentesis - 08/16/2014  MEDICATIONS: The patient is currently admitted to the hospital and receiving intravenous antibiotics. The antibiotics were administered within an appropriate time frame prior to the initiation of the procedure.  ANESTHESIA/SEDATION: Fentanyl 50 mcg IV; Versed 1 mg IV  Total Moderate Sedation time  22 minutes  CONTRAST:  None  COMPLICATIONS: None immediate  PROCEDURE: Informed written consent was obtained from the patient after a discussion of the risks, benefits and alternatives to treatment. The patient was placed supine, slightly LPO on the CT gantry and a pre procedural CT was performed re-demonstrating the known complex right-sided pleural effusion. The procedure was planned. A timeout was performed prior to the initiation of the procedure.  The right lateral chest was prepped and draped in the usual sterile fashion. The overlying soft tissues were anesthetized with 1% lidocaine with epinephrine. Appropriate trajectory was planned with the use of a 22 gauge spinal needle. An 18 gauge trocar needle was advanced into the abscess/fluid collection and a short Amplatz super stiff wire was coiled within the collection. Appropriate positioning was confirmed with a limited CT scan. The tract was serially dilated allowing placement of a 14 French all-purpose drainage catheter. Appropriate positioning was confirmed with a limited postprocedural CT scan.  Approximately 1 L foul smelling brown fluid was aspirated from the right pleural space. Following removal for approximately 1 L of purulent material, limited CT scanning was performed  demonstrating development of a small loculated ex vacuo pneumothorax within the right basilar pleural space. The tube was connected to a pleural vac and sutured in place. A dressing was placed. The patient tolerated the procedure well without immediate post procedural complication.  IMPRESSION: Successful CT guided placement of a 46 French all purpose drain catheter into the right pleural space with aspiration of 1 L of purulent, brown-colored fluid. Samples were sent to the laboratory as requested by the ordering clinical team.   Electronically Signed   By: Sandi Mariscal M.D.   On: 11/10/2014 16:23   xrays viewed by me MDM  Patient felt to be septic from cellulitis of of left foot and diabetic foot ulcers He is also in atrial fibrillation with rapid ventricular response. Spoke with Dr.Gosrani plan Admit step down unit. Diagnoses #1 sepsis #2 cellulitis of left foot with ascending lymphangitis #3atrial fibrillation with rapid ventricular response #4 anemia #5 hyperglycemia #6 hyperbilirubinemia #6 subtheraputic INR CRITICAL CARE Performed by: Orlie Dakin Total critical care time: 65 minute Critical care time was exclusive of separately billable procedures and treating  other patients. Critical care was necessary to treat or prevent imminent or life-threatening deterioration. Critical care was time spent personally by me on the following activities: development of treatment plan with patient and/or surrogate as well as nursing, discussions with consultants, evaluation of patient's response to treatment, examination of patient, obtaining history from patient or surrogate, ordering and performing treatments and interventions, ordering and review of laboratory studies, ordering and review of radiographic studies, pulse oximetry and re-evaluation of patient's condition. Final diagnoses:  None        Orlie Dakin, MD 12/05/2014 [place], MD 11/28/2014 1755

## 2014-12-08 NOTE — Progress Notes (Signed)
ANTICOAGULATION CONSULT NOTE - Initial Consult  Pharmacy Consult for Warfarin Indication: atrial fibrillation  Allergies  Allergen Reactions  . Bacitracin Other (See Comments)    unknown  . Norvasc [Amlodipine Besylate] Other (See Comments)    unknown    Patient Measurements: Height: 6\' 3"  (190.5 cm) Weight: 200 lb (90.719 kg) IBW/kg (Calculated) : 84.5  Vital Signs: Temp: 98.8 F (37.1 C) (04/12 1737) Temp Source: Oral (04/12 1737) BP: 82/67 mmHg (04/12 1801) Pulse Rate: 123 (04/12 1815)  Labs:  Recent Labs  12/05/2014 1415  HGB 9.7*  HCT 31.0*  PLT 333  LABPROT 20.0*  INR 1.68*  CREATININE 3.96*    Estimated Creatinine Clearance: 22.2 mL/min (by C-G formula based on Cr of 3.96).   Medical History: Past Medical History  Diagnosis Date  . UGI bleed 02/12/11    On anticoagulation; EGD w/snare polypectomy-multiple polypoid lesions antrum(benign), Chronic active gastritis (NEGATIVE H pylori)  . Anemia, chronic disease   . Sepsis due to enterococcus 02/11/11  . Atrial fibrillation     On coumadin  . Coronary atherosclerosis of native coronary artery     Multivessel status post CABG  . Cardiomyopathy   . Type 2 diabetes mellitus   . Essential hypertension, benign   . Hyperlipemia   . Gout   . S/P patent foramen ovale closure   . Hyperbilirubinemia 08/10/2013  . SBO (small bowel obstruction) 01/2014  . 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 Clostridium difficile colitis 01/2014  . Diverticulosis   . Internal hemorrhoids   . ESRD on hemodialysis     Started 2008-2009. Dr Hinda Lenis on a TTS schedule - R forearm AVF for several years      Medications:   (Not in a hospital admission)  Home Meds Reviewed Home Warfarin 0.5mg  Daily except no doses one Sunday and Wednesday.  Assessment: Okay for Protocol, INR below goal, INR fluctuating and multiple dose changes since last admission in March.  Goal of Therapy:  INR 2-3   Plan:  Warfarin 1mg  PO x 1. Daily  PT/INR  Pricilla Larsson 12/22/2014,6:30 PM

## 2014-12-08 NOTE — ED Notes (Signed)
Patient placed on continuous cardiac monitoring, continuous pulse ox

## 2014-12-08 NOTE — ED Notes (Signed)
Pt is dialysis pt, coming from there today. 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

## 2014-12-09 ENCOUNTER — Inpatient Hospital Stay (HOSPITAL_COMMUNITY): Payer: Medicare Other

## 2014-12-09 DIAGNOSIS — A419 Sepsis, unspecified organism: Secondary | ICD-10-CM | POA: Diagnosis present

## 2014-12-09 LAB — COMPREHENSIVE METABOLIC PANEL
ALK PHOS: 129 U/L — AB (ref 39–117)
ALT: 23 U/L (ref 0–53)
AST: 43 U/L — ABNORMAL HIGH (ref 0–37)
Albumin: 2 g/dL — ABNORMAL LOW (ref 3.5–5.2)
Anion gap: 10 (ref 5–15)
BILIRUBIN TOTAL: 5 mg/dL — AB (ref 0.3–1.2)
BUN: 23 mg/dL (ref 6–23)
CHLORIDE: 102 mmol/L (ref 96–112)
CO2: 26 mmol/L (ref 19–32)
Calcium: 7.8 mg/dL — ABNORMAL LOW (ref 8.4–10.5)
Creatinine, Ser: 4.49 mg/dL — ABNORMAL HIGH (ref 0.50–1.35)
GFR, EST AFRICAN AMERICAN: 15 mL/min — AB (ref >90)
GFR, EST NON AFRICAN AMERICAN: 13 mL/min — AB (ref >90)
GLUCOSE: 120 mg/dL — AB (ref 70–99)
Potassium: 3.6 mmol/L (ref 3.5–5.1)
SODIUM: 138 mmol/L (ref 135–145)
Total Protein: 6.7 g/dL (ref 6.0–8.3)

## 2014-12-09 LAB — CBC
HEMATOCRIT: 29.4 % — AB (ref 39.0–52.0)
HEMOGLOBIN: 9.3 g/dL — AB (ref 13.0–17.0)
MCH: 27.9 pg (ref 26.0–34.0)
MCHC: 31.6 g/dL (ref 30.0–36.0)
MCV: 88.3 fL (ref 78.0–100.0)
Platelets: 219 10*3/uL (ref 150–400)
RBC: 3.33 MIL/uL — AB (ref 4.22–5.81)
RDW: 16.8 % — AB (ref 11.5–15.5)
WBC: 13.8 10*3/uL — ABNORMAL HIGH (ref 4.0–10.5)

## 2014-12-09 LAB — PROTIME-INR
INR: 1.74 — ABNORMAL HIGH (ref 0.00–1.49)
Prothrombin Time: 20.5 seconds — ABNORMAL HIGH (ref 11.6–15.2)

## 2014-12-09 LAB — GLUCOSE, CAPILLARY
GLUCOSE-CAPILLARY: 136 mg/dL — AB (ref 70–99)
GLUCOSE-CAPILLARY: 87 mg/dL (ref 70–99)
Glucose-Capillary: 104 mg/dL — ABNORMAL HIGH (ref 70–99)
Glucose-Capillary: 115 mg/dL — ABNORMAL HIGH (ref 70–99)

## 2014-12-09 LAB — LACTIC ACID, PLASMA
LACTIC ACID, VENOUS: 2.2 mmol/L — AB (ref 0.5–2.0)
Lactic Acid, Venous: 1.5 mmol/L (ref 0.5–2.0)

## 2014-12-09 LAB — MRSA PCR SCREENING: MRSA BY PCR: NEGATIVE

## 2014-12-09 MED ORDER — WARFARIN SODIUM 1 MG PO TABS
1.0000 mg | ORAL_TABLET | Freq: Once | ORAL | Status: AC
  Administered 2014-12-09: 1 mg via ORAL
  Filled 2014-12-09: qty 1

## 2014-12-09 MED ORDER — PIPERACILLIN SOD-TAZOBACTAM SO 2.25 (2-0.25) G IV SOLR
2.2500 g | Freq: Three times a day (TID) | INTRAVENOUS | Status: DC
  Administered 2014-12-09 – 2014-12-19 (×29): 2.25 g via INTRAVENOUS
  Filled 2014-12-09 (×39): qty 2.25

## 2014-12-09 MED ORDER — SODIUM CHLORIDE 0.9 % IV BOLUS (SEPSIS): 500.0000 mL | Freq: Once | INTRAVENOUS | Status: DC

## 2014-12-09 NOTE — Care Management Note (Signed)
    Page 1 of 1   12/09/2014     3:18:54 PM CARE MANAGEMENT NOTE 12/09/2014  Patient:  OTTO, FELKINS   Account Number:  [id number]  Date Initiated:  12/09/2014  Documentation initiated by:  Jolene Provost  Subjective/Objective Assessment:   Pt is from home, lives with wife. Admitted for foot infection.  Pt says he was recently discharged from [facility] for [facility] RN, PT. Pt has walker and wheelchair at home. Pt uses a prosthesis for AKA. Pt on HD at [facility] in [place] on T/R/Sa.     Action/Plan:   Awaiting surgery consult for tx plan for gangrenous foot. Pt plans to discharge home, anticipate need for [facility]. Pt would like [facility] again if needed. Will cont to follow for CM needs.   Anticipated DC Date:  12/14/2014   Anticipated DC Plan:  [place]  CM consult      [facility] Choice  HOME HEALTH   Choice offered to / List presented to:  C-1 Patient           [place].   Status of service:  In process, will continue to follow Medicare Important Message given?   (If response is "NO", the following Medicare IM given date fields will be blank) Date Medicare IM given:   Medicare IM given by:   Date Additional Medicare IM given:   Additional Medicare IM given by:    Discharge Disposition:    Per UR Regulation:  Reviewed for med. necessity/level of care/duration of stay  If discussed at [place] of Stay Meetings, dates discussed:    Comments:  12/09/2014 [place], RN, MSN, CM

## 2014-12-09 NOTE — Care Management Utilization Note (Signed)
UR completed 

## 2014-12-09 NOTE — Progress Notes (Signed)
Called Dr. Aline Brochure ortho about Mr. Boyajian and Dr. Aline Brochure stated that general surgery would have to handle that.

## 2014-12-09 NOTE — Progress Notes (Signed)
ANTICOAGULATION CONSULT NOTE - follow up  Pharmacy Consult for Warfarin Indication: atrial fibrillation  Allergies  Allergen Reactions  . Bacitracin Other (See Comments)    unknown  . Norvasc [Amlodipine Besylate] Other (See Comments)    unknown   Patient Measurements: Height: 6\' 3"  (190.5 cm) Weight: 191 lb 2.2 oz (86.7 kg) IBW/kg (Calculated) : 84.5  Vital Signs: Temp: 98.4 F (36.9 C) (04/13 0747) Temp Source: Oral (04/13 0747) BP: 90/58 mmHg (04/13 0800) Pulse Rate: 27 (04/13 0800)  Labs:  Recent Labs  12/13/2014 1415 12/09/14 0438  HGB 9.7* 9.3*  HCT 31.0* 29.4*  PLT 333 219  LABPROT 20.0* 20.5*  INR 1.68* 1.74*  CREATININE 3.96* 4.49*   Estimated Creatinine Clearance: 19.6 mL/min (by C-G formula based on Cr of 4.49).  Medical History: Past Medical History  Diagnosis Date  . UGI bleed 02/12/11    On anticoagulation; EGD w/snare polypectomy-multiple polypoid lesions antrum(benign), Chronic active gastritis (NEGATIVE H pylori)  . Anemia, chronic disease   . Sepsis due to enterococcus 02/11/11  . Atrial fibrillation     On coumadin  . Coronary atherosclerosis of native coronary artery     Multivessel status post CABG  . Cardiomyopathy   . Type 2 diabetes mellitus   . Essential hypertension, benign   . Hyperlipemia   . Gout   . S/P patent foramen ovale closure   . Hyperbilirubinemia 08/10/2013  . SBO (small bowel obstruction) 01/2014  . 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 Clostridium difficile colitis 01/2014  . Diverticulosis   . Internal hemorrhoids   . ESRD on hemodialysis     Started 2008-2009. Dr Hinda Lenis on a TTS schedule - R forearm AVF for several years     Medications:  Prescriptions prior to admission  Medication Sig Dispense Refill Last Dose  . albuterol (PROVENTIL) (2.5 MG/3ML) 0.083% nebulizer solution Take 3 mLs (2.5 mg total) by nebulization every 2 (two) hours as needed for wheezing. 75 mL 12 11/06/2014  . allopurinol (ZYLOPRIM) 100 MG  tablet Take 100 mg by mouth daily.    12/07/2014 at Unknown time  . amiodarone (PACERONE) 200 MG tablet Take 1 tablet (200 mg total) by mouth daily.   12/05/2014 at Unknown time  . B Complex-C-Zn-Folic Acid (DIALYVITE/ZINC PO) Take by mouth 3 (three) times a week. Patient has dialysis on Tuesdays, Thursdays, and Saturdays   12/05/2014  . FLUoxetine (PROZAC) 20 MG capsule Take 20 mg by mouth daily.   12/05/2014  . HYDROcodone-acetaminophen (NORCO/VICODIN) 5-325 MG per tablet Take 1 tablet by mouth every 6 (six) hours as needed for moderate pain. 100 tablet 0 Past Week at Unknown time  . insulin glargine (LANTUS) 100 UNIT/ML injection Inject 10 Units into the skin at bedtime.   12/07/2014 at Unknown time  . metoprolol tartrate (LOPRESSOR) 25 MG tablet Take 0.5 tablets (12.5 mg total) by mouth 2 (two) times daily. 30 tablet 0 12/07/2014 at Unknown time  . midodrine (PROAMATINE) 10 MG tablet Take 1 tablet (10 mg total) by mouth 2 (two) times daily with a meal.   12/07/2014 at Unknown time  . pantoprazole (PROTONIX) 40 MG tablet Take 40 mg by mouth 2 (two) times daily.    12/07/2014 at Unknown time  . RENVELA 800 MG tablet Take 800 mg by mouth 3 (three) times daily with meals.    12/07/2014 at Unknown time  . SENSIPAR 30 MG tablet Take 30 mg by mouth daily.   12/07/2014 at Unknown time  .  traMADol (ULTRAM) 50 MG tablet Take by mouth every 8 (eight) hours as needed for moderate pain.   unknown  . traZODone (DESYREL) 50 MG tablet Take 1 tablet (50 mg total) by mouth at bedtime. 30 tablet 0 12/07/2014 at Unknown time  . warfarin (COUMADIN) 1 MG tablet Take 0.5 mg by mouth See admin instructions. Take 1/2 tablet daily (0.5mg ) daily except nothing on Sunday and Wednesday.     . Nutritional Supplements (FEEDING SUPPLEMENT, NEPRO CARB STEADY,) LIQD Take 237 mLs by mouth as needed (missed meal during dialysis.).  0     Home Meds Reviewed Home Warfarin 0.5mg  Daily except no doses one Sunday and Wednesday.  Medications Prior  to Admission  Medication Sig Dispense Refill  . albuterol (PROVENTIL) (2.5 MG/3ML) 0.083% nebulizer solution Take 3 mLs (2.5 mg total) by nebulization every 2 (two) hours as needed for wheezing. 75 mL 12  . allopurinol (ZYLOPRIM) 100 MG tablet Take 100 mg by mouth daily.     Marland Kitchen amiodarone (PACERONE) 200 MG tablet Take 1 tablet (200 mg total) by mouth daily.    . B Complex-C-Zn-Folic Acid (DIALYVITE/ZINC PO) Take by mouth 3 (three) times a week. Patient has dialysis on Tuesdays, Thursdays, and Saturdays    . FLUoxetine (PROZAC) 20 MG capsule Take 20 mg by mouth daily.    Marland Kitchen HYDROcodone-acetaminophen (NORCO/VICODIN) 5-325 MG per tablet Take 1 tablet by mouth every 6 (six) hours as needed for moderate pain. 100 tablet 0  . insulin glargine (LANTUS) 100 UNIT/ML injection Inject 10 Units into the skin at bedtime.    . metoprolol tartrate (LOPRESSOR) 25 MG tablet Take 0.5 tablets (12.5 mg total) by mouth 2 (two) times daily. 30 tablet 0  . midodrine (PROAMATINE) 10 MG tablet Take 1 tablet (10 mg total) by mouth 2 (two) times daily with a meal.    . pantoprazole (PROTONIX) 40 MG tablet Take 40 mg by mouth 2 (two) times daily.     Marland Kitchen RENVELA 800 MG tablet Take 800 mg by mouth 3 (three) times daily with meals.     . SENSIPAR 30 MG tablet Take 30 mg by mouth daily.    . traMADol (ULTRAM) 50 MG tablet Take by mouth every 8 (eight) hours as needed for moderate pain.    . traZODone (DESYREL) 50 MG tablet Take 1 tablet (50 mg total) by mouth at bedtime. 30 tablet 0  . warfarin (COUMADIN) 1 MG tablet Take 0.5 mg by mouth See admin instructions. Take 1/2 tablet daily (0.5mg ) daily except nothing on Sunday and Wednesday.    . Nutritional Supplements (FEEDING SUPPLEMENT, NEPRO CARB STEADY,) LIQD Take 237 mLs by mouth as needed (missed meal during dialysis.).  0    Assessment: Okay for Protocol, INR below goal, INR fluctuating and multiple dose changes since last admission in March.  [facility] records from [facility]  reviewed and noted:  Anticoagulation Monitoring 12/03/2014  INR goal 2.0-3.0  Assoc. INR Date 12/03/2014  Associated INR 2.6  Pt. deviation No  Current weekly dose 2 mg  Sunday dose 0 mg  Monday dose 0.5 mg  Tuesday dose 0.5 mg  Wednesday dose 0 mg  Thursday dose 0.5 mg  Friday dose 0.5 mg  Saturday dose 0.5 mg  Weekly dose 2.5 mg  Dose description Tablet Strength Change:  Start back using 1mg  tablet . . .  Return date 12/10/2014  VISIT REPORT     Goal of Therapy:  INR 2-3   Plan:  Warfarin 1mg   PO x 1 since INR below goal Daily PT/INR  [name] A 12/09/2014,11:09 AM

## 2014-12-09 NOTE — Progress Notes (Signed)
Sent a text to Dr. Arnoldo Morale for a surgical consult for [name] [name] office was closed at time of call.

## 2014-12-09 NOTE — Progress Notes (Addendum)
[facility] PROGRESS NOTE Interim History: 64 year old male with past medical history of diabetes, atrial fibrillation on Coumadin, end-stage renal disease that comes in left foot cellulitis and gangrene. Filed Weights   12/25/2014 1358 12/26/2014 1840 12/09/14 0500  Weight: 90.719 kg (200 lb) 85.9 kg (189 lb 6 oz) 86.7 kg (191 lb 2.2 oz)        Intake/Output Summary (Last 24 hours) at 12/09/14 8144 Last data filed at 12/09/14 0300  Gross per 24 hour  Intake 1888.41 ml  Output      0 ml  Net 1888.41 ml     Assessment/Plan: Sepsis due to Cellulitis of left foot and gangrene: - Started empirically on vancomycin and Zosyn, surgery consulted - Given IV fluids liter and continued 100 mL an hour. I will go having KVO this due to his renal disease. - He has mild JVD on physical exam lungs are clear.  Atrial fibrillation with RVR - At home he is on amiodarone which was continued along with metoprolol. - Started on low-dose diltiazem. INR subtherapeutic continue Coumadin per pharmacy.  Recurrent pleural effusion on right: - Appears to be chronic asymptomatic.  ESRD (end stage renal disease) on dialysis: - Nephrology has already been consulted Dr. Bing Quarry.  Diabetes mellitus with diabetic nephropathy: - We'll control in house continue current therapy.    Code Status: full Code Family Communication: None  Disposition Plan: Unable to determine, probably 5-6 days.   Consultants:  Surgery  renal  Procedures: ECHO: none  Antibiotics:  vanc and zosyn 4.12.2016  HPI/Subjective: He relates his pain is controlled, denies any shortness of breath.  Objective: Filed Vitals:   12/09/14 0400 12/09/14 0500 12/09/14 0700 12/09/14 0747  BP: 90/59 101/67 76/47   Pulse: 47 29 29   Temp: 98.2 F (36.8 C)   98.4 F (36.9 C)  TempSrc: Oral   Oral  Resp: 24 21 23    Height:      Weight:  86.7 kg (191 lb 2.2 oz)    SpO2: 99% 98% 90%      Exam:  General: Alert,  awake, oriented x3, in no acute distress.  HEENT: No bruits, no goiter. +JVD Heart: Regular rate and rhythm. Lungs: Good air movement, clear. Abdomen: Soft, nontender, nondistended, positive bowel sounds.  Neuro: Grossly intact, nonfocal.   Data Reviewed: Basic Metabolic Panel:  Recent Labs Lab 12/18/2014 1415 12/09/14 0438  NA 136 138  K 3.8 3.6  CL 95* 102  CO2 26 26  GLUCOSE 193* 120*  BUN 20 23  CREATININE 3.96* 4.49*  CALCIUM 7.9* 7.8*   Liver Function Tests:  Recent Labs Lab 11/27/2014 1415 12/09/14 0438  AST 57* 43*  ALT 28 23  ALKPHOS 155* 129*  BILITOT 4.5* 5.0*  PROT 7.9 6.7  ALBUMIN 2.3* 2.0*   No results for input(s): LIPASE, AMYLASE in the last 168 hours. No results for input(s): AMMONIA in the last 168 hours. CBC:  Recent Labs Lab 12/13/2014 1415 12/09/14 0438  WBC 17.6* 13.8*  NEUTROABS 15.7*  --   HGB 9.7* 9.3*  HCT 31.0* 29.4*  MCV 88.8 88.3  PLT 333 219   Cardiac Enzymes: No results for input(s): CKTOTAL, CKMB, CKMBINDEX, TROPONINI in the last 168 hours. BNP (last 3 results) No results for input(s): BNP in the last 8760 hours.  ProBNP (last 3 results)  Recent Labs  08/15/14 0245  PROBNP >70000.0*    CBG:  Recent Labs Lab 12/07/2014 2102 12/09/14 0733  GLUCAP 151* 104*  Recent Results (from the past 240 hour(s))  Blood Culture (routine x 2)     Status: None (Preliminary result)   Collection Time: 12/22/2014  2:48 PM  Result Value Ref Range Status   Specimen Description BLOOD LEFT FOREARM  Final   Special Requests BOTTLES DRAWN AEROBIC ONLY 4CC  Final   Culture PENDING  Incomplete   Report Status PENDING  Incomplete  Blood Culture (routine x 2)     Status: None (Preliminary result)   Collection Time: 11/29/2014  3:00 PM  Result Value Ref Range Status   Specimen Description BLOOD RIGHT HAND  Final   Special Requests   Final    BOTTLES DRAWN AEROBIC AND ANAEROBIC AEB=6CC ANA=4CC   Culture PENDING  Incomplete   Report Status  PENDING  Incomplete  MRSA PCR Screening     Status: None   Collection Time: 12/26/2014  6:40 PM  Result Value Ref Range Status   MRSA by PCR NEGATIVE NEGATIVE Final    Comment:        The GeneXpert MRSA Assay (FDA approved for NASAL specimens only), is one component of a comprehensive MRSA colonization surveillance program. It is not intended to diagnose MRSA infection nor to guide or monitor treatment for MRSA infections.      Studies: Dg Chest Port 1 View  12/02/2014   CLINICAL DATA:  Renal failure ; anemia  EXAM: PORTABLE CHEST - 1 VIEW  COMPARISON:  November 18, 2014 chest radiograph and chest CT November 09, 2014  FINDINGS: There is extensive airspace consolidation and loculated effusion throughout much of the right hemithorax, stable. There is underlying generalized interstitial edema. There is cardiomegaly with pulmonary venous hypertension. Patient is status post median sternotomy. No bone lesions are appreciable.  IMPRESSION: Underlying congestive heart failure. Extensive loculated effusion and airspace consolidation throughout much of the right lung. These findings were present previously and are not felt to be changed significantly.   Electronically Signed   By: Lowella Grip III M.D.   On: 12/19/2014 14:58   Dg Foot Complete Left  12/14/2014   CLINICAL DATA:  Left middle toe pain, dark area, patient on dialysis.  EXAM: LEFT FOOT - COMPLETE 3+ VIEW  COMPARISON:  None.  FINDINGS: Five views of left foot submitted. No acute fracture or subluxation. Diffuse osteopenia. Extensive atherosclerotic vascular calcifications. Plantar spur of calcaneus.  IMPRESSION: No acute fracture or subluxation. Diffuse osteopenia. Atherosclerotic vascular calcifications. Plantar spur of calcaneus.   Electronically Signed   By: Lahoma Crocker M.D.   On: 12/22/2014 14:58    Scheduled Meds: . allopurinol  100 mg Oral Daily  . amiodarone  200 mg Oral Daily  . antiseptic oral rinse  7 mL Mouth Rinse BID  .  cinacalcet  30 mg Oral Q breakfast  . FLUoxetine  20 mg Oral Daily  . insulin aspart  0-5 Units Subcutaneous QHS  . insulin aspart  0-9 Units Subcutaneous TID WC  . insulin glargine  10 Units Subcutaneous QHS  . metoprolol tartrate  12.5 mg Oral BID  . midodrine  10 mg Oral BID WC  . pantoprazole  40 mg Oral BID  . piperacillin-tazobactam (ZOSYN)  IV  2.25 g Intravenous 3 times per day  . sevelamer carbonate  800 mg Oral TID WC  . sodium chloride  3 mL Intravenous Q12H  . traZODone  50 mg Oral QHS  . [START ON 12/10/2014] vancomycin  1,000 mg Intravenous Q T,Th,Sa-HD  . Warfarin - Pharmacist Dosing Inpatient  Does not apply Q24H   Continuous Infusions: . sodium chloride 100 mL/hr at 12/09/14 0300  . diltiazem (CARDIZEM) infusion 5 mg/hr (12/09/14 0931)     Charlynne Cousins  [facility] Pager [phone] If 7PM-7AM, please contact night-coverage at [email or web address], password [facility] 12/09/2014, 8:23 AM  LOS: 1 day

## 2014-12-09 NOTE — Progress Notes (Signed)
I applied gauze to the tops of the third and fourth toe and in between the third and fourth toe along with kerlex wrap.  The third toe appeared black with no sensation stated by the patient.  The fourth toe had minimal bleeding around the toenail region.  Patient tolerated the dressing well.

## 2014-12-10 ENCOUNTER — Inpatient Hospital Stay (HOSPITAL_COMMUNITY): Payer: Medicare Other

## 2014-12-10 ENCOUNTER — Encounter (HOSPITAL_COMMUNITY): Payer: Self-pay | Admitting: Physician Assistant

## 2014-12-10 DIAGNOSIS — Z953 Presence of xenogenic heart valve: Secondary | ICD-10-CM

## 2014-12-10 DIAGNOSIS — I5022 Chronic systolic (congestive) heart failure: Secondary | ICD-10-CM

## 2014-12-10 DIAGNOSIS — J90 Pleural effusion, not elsewhere classified: Secondary | ICD-10-CM

## 2014-12-10 DIAGNOSIS — Z952 Presence of prosthetic heart valve: Secondary | ICD-10-CM

## 2014-12-10 DIAGNOSIS — I4891 Unspecified atrial fibrillation: Secondary | ICD-10-CM

## 2014-12-10 LAB — GLUCOSE, CAPILLARY
GLUCOSE-CAPILLARY: 80 mg/dL (ref 70–99)
GLUCOSE-CAPILLARY: 96 mg/dL (ref 70–99)
Glucose-Capillary: 144 mg/dL — ABNORMAL HIGH (ref 70–99)
Glucose-Capillary: 125 mg/dL — ABNORMAL HIGH (ref 70–99)

## 2014-12-10 LAB — LACTIC ACID, PLASMA
Lactic Acid, Venous: 1.1 mmol/L (ref 0.5–2.0)
Lactic Acid, Venous: 2.2 mmol/L (ref 0.5–2.0)

## 2014-12-10 LAB — CBC
HEMATOCRIT: 28 % — AB (ref 39.0–52.0)
HEMOGLOBIN: 9 g/dL — AB (ref 13.0–17.0)
MCH: 28 pg (ref 26.0–34.0)
MCHC: 32.1 g/dL (ref 30.0–36.0)
MCV: 87.2 fL (ref 78.0–100.0)
Platelets: 246 10*3/uL (ref 150–400)
RBC: 3.21 MIL/uL — AB (ref 4.22–5.81)
RDW: 16.8 % — ABNORMAL HIGH (ref 11.5–15.5)
WBC: 18.3 10*3/uL — AB (ref 4.0–10.5)

## 2014-12-10 LAB — PROTIME-INR
INR: 2.05 — ABNORMAL HIGH (ref 0.00–1.49)
Prothrombin Time: 23.3 seconds — ABNORMAL HIGH (ref 11.6–15.2)

## 2014-12-10 MED ORDER — AMIODARONE HCL IN DEXTROSE 360-4.14 MG/200ML-% IV SOLN
60.0000 mg/h | INTRAVENOUS | Status: AC
  Administered 2014-12-19: 60 mg/h via INTRAVENOUS

## 2014-12-10 MED ORDER — PENTAFLUOROPROP-TETRAFLUOROETH EX AERO
1.0000 "application " | INHALATION_SPRAY | CUTANEOUS | Status: DC | PRN
  Filled 2014-12-10: qty 30

## 2014-12-10 MED ORDER — ALTEPLASE 2 MG IJ SOLR
2.0000 mg | Freq: Once | INTRAMUSCULAR | Status: AC | PRN
  Filled 2014-12-10: qty 2

## 2014-12-10 MED ORDER — LIDOCAINE-PRILOCAINE 2.5-2.5 % EX CREA
1.0000 "application " | TOPICAL_CREAM | CUTANEOUS | Status: DC | PRN
  Filled 2014-12-10: qty 5

## 2014-12-10 MED ORDER — AMIODARONE LOAD VIA INFUSION
150.0000 mg | Freq: Once | INTRAVENOUS | Status: AC
  Administered 2014-12-19: 150 mg via INTRAVENOUS
  Filled 2014-12-10: qty 83.34

## 2014-12-10 MED ORDER — METOPROLOL TARTRATE 1 MG/ML IV SOLN: 5.0000 mg | Freq: Four times a day (QID) | INTRAVENOUS | Status: DC | PRN

## 2014-12-10 MED ORDER — SODIUM CHLORIDE 0.9 % IV BOLUS (SEPSIS)
250.0000 mL | Freq: Once | INTRAVENOUS | Status: AC
  Administered 2014-12-10: 250 mL via INTRAVENOUS

## 2014-12-10 MED ORDER — AMIODARONE IV BOLUS ONLY 150 MG/100ML
150.0000 mg | Freq: Once | INTRAVENOUS | Status: DC
  Administered 2014-12-10: 150 mg via INTRAVENOUS

## 2014-12-10 MED ORDER — EPOETIN ALFA 10000 UNIT/ML IJ SOLN
10000.0000 [IU] | INTRAMUSCULAR | Status: DC
  Administered 2014-12-10: 10000 [IU] via INTRAVENOUS
  Filled 2014-12-10 (×5): qty 1

## 2014-12-10 MED ORDER — EPOETIN ALFA 10000 UNIT/ML IJ SOLN
INTRAMUSCULAR | Status: AC
  Administered 2014-12-10: 10000 [IU] via INTRAVENOUS
  Filled 2014-12-10: qty 1

## 2014-12-10 MED ORDER — AMIODARONE HCL IN DEXTROSE 360-4.14 MG/200ML-% IV SOLN
INTRAVENOUS | Status: AC
  Filled 2014-12-10: qty 200

## 2014-12-10 MED ORDER — HEPARIN SODIUM (PORCINE) 1000 UNIT/ML DIALYSIS
1000.0000 [IU] | INTRAMUSCULAR | Status: DC | PRN
  Filled 2014-12-10: qty 1

## 2014-12-10 MED ORDER — LIDOCAINE HCL (PF) 1 % IJ SOLN: 5.0000 mL | INTRAMUSCULAR | Status: DC | PRN

## 2014-12-10 MED ORDER — NEPRO/CARBSTEADY PO LIQD
237.0000 mL | ORAL | Status: DC | PRN
  Filled 2014-12-10: qty 237

## 2014-12-10 MED ORDER — METOPROLOL TARTRATE 25 MG PO TABS
25.0000 mg | ORAL_TABLET | Freq: Two times a day (BID) | ORAL | Status: DC
  Administered 2014-12-10 – 2014-12-11 (×2): 25 mg via ORAL
  Filled 2014-12-10 (×4): qty 1

## 2014-12-10 MED ORDER — SODIUM CHLORIDE 0.9 % IV SOLN
100.0000 mL | INTRAVENOUS | Status: DC | PRN
  Administered 2014-12-13 – 2014-12-18 (×2): via INTRAVENOUS

## 2014-12-10 MED ORDER — SODIUM CHLORIDE 0.9 % IV SOLN: 100.0000 mL | INTRAVENOUS | Status: DC | PRN

## 2014-12-10 MED ORDER — AMIODARONE IV BOLUS ONLY 150 MG/100ML
INTRAVENOUS | Status: AC
  Administered 2014-12-10: 150 mg via INTRAVENOUS
  Filled 2014-12-10: qty 100

## 2014-12-10 MED ORDER — WARFARIN SODIUM 1 MG PO TABS
0.5000 mg | ORAL_TABLET | Freq: Once | ORAL | Status: AC
  Administered 2014-12-10: 0.5 mg via ORAL
  Filled 2014-12-10: qty 1

## 2014-12-10 MED ORDER — AMIODARONE HCL IN DEXTROSE 360-4.14 MG/200ML-% IV SOLN
30.0000 mg/h | INTRAVENOUS | Status: DC
  Administered 2014-12-10 – 2014-12-22 (×21): 30 mg/h via INTRAVENOUS
  Filled 2014-12-10 (×52): qty 200

## 2014-12-10 NOTE — Consult Note (Signed)
Cardiology Consultation Note  Patient ID: [name] [name], MRN: [id number], DOB/AGE: July 17, 1951 64 y.o. Admit date: 12/14/2014   Date of Consult: 12/10/2014 Primary Physician: [name], MD Primary Cardiologist: Domenic Polite  Chief Complaint: foot pain (gangrene) Reason for Consultation: atrial fib with RVR  HPI: [name] [name] is a 64 y/o M CAD s/p CABG in 2008 with bioprosthetic mitral valve replacement at that time, ESRD on HD, prior GI bleed, HTN, HLD, PAF on oral amiodarone/Coumadin (s/p TEE/DCCV in 40/9811), chronic systolic dysfunction (last EF 15% in 07/2014), anemia, R AKA, chronic right loculated pleural effusion s/p chest tube 10/2014 whom we are asked to see for atrial fibrillation. It appears he was in atrial fibrillation during last admission in March 2016. Cardiology was not involved during that admission. He presented to [facility] on 12/12/2014 with complaints of worsening pain and difficulty with walking in his left leg. He was found to have gangrene/cellulitis. He is on empiric antibiotics and surgery has been consulted.   He appears to have been in atrial fibrillation since admission with initial HR in the 140, and intermittent control of HR to 90s-100s but occasional RVR as he is doing now. HR is in the 120s, BP has ranged from 80s-90s during dialysis. He denies any current awareness of tachycardia. No chest pain, dyspnea, LEE, orthopnea. Last temp was afebrile. For some reason the computer says pulse is 25 but this is autopopulating from an unclear source. He has not had any bradycardia whatsoever. Labs notable for WBC 18k, Hgb 9.0, elevated lactic acid, albumin 2.0, INR initially 1.68 and now up around 2 after being managed by pharmacy. He is on metoprolol 25mg  BID. He is also on dilt gtt at 5mg /hr (no bolus). Home dose of amiodarone is 200mg  daily.  Past Medical History  Diagnosis Date  . UGI bleed 02/12/11    On anticoagulation; EGD w/snare polypectomy-multiple polypoid lesions  antrum(benign), Chronic active gastritis (NEGATIVE H pylori)  . Anemia, chronic disease   . Sepsis due to enterococcus 02/11/11  . Paroxysmal atrial fibrillation     On coumadin  . Coronary atherosclerosis of native coronary artery     a. Multivessel status post CABG 2008.  . Cardiomyopathy   . Type 2 diabetes mellitus   . Essential hypertension, benign   . Hyperlipemia   . Gout   . S/P patent foramen ovale closure   . Hyperbilirubinemia 08/10/2013  . SBO (small bowel obstruction) 01/2014  . 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 Clostridium difficile colitis 01/2014  . Diverticulosis   . Internal hemorrhoids   . ESRD on hemodialysis     Started 2008-2009. Dr Hinda Lenis on a TTS schedule - R forearm AVF for several years    . Orthostatic hypotension   . S/P mitral valve replacement with bioprosthetic valve     a. 2008 at time of CABG.  . Chronic systolic CHF (congestive heart failure)     a. Last EF 15% in 07/2014.  Marland Kitchen Loculated pleural effusion     a. chronic right loculated pleural effusion s/p chest tube 10/2014.      Most Recent Cardiac Studies: TEE 07/2014 Study Conclusions - Left ventricle: Systolic function was severely reduced. The estimated ejection fraction was 15%. Diffuse hypokinesis. - Aortic valve: Cusp separation was reduced. There was mild stenosis. - Mitral valve: A bioprosthesis was present. There was mild regurgitation. - Left atrium: The atrium was severely dilated. No evidence of thrombus in the atrial cavity; LAA ligated. There was  moderatecontinuous spontaneous echo contrast (&quot;smoke&quot;) in the cavity. - Right ventricle: The cavity size was mildly dilated. Systolic function was severely reduced. - Right atrium: The atrium was mildly dilated. - Atrial septum: No defect or patent foramen ovale was identified. - Tricuspid valve: No evidence of vegetation. Impressions: - Severe global reduction in LV function; 4 chamber  enlargement; left atrium with spontaneous contrast but no thrombus; LAA ligated; severe RV dysfunction; bioprosthetic MV with mild MR; calcifed aortic valve with mild AS (AVA 1.6 by planimetry; mean gradient 10 mmHg); mild TR.   Surgical History:  Past Surgical History  Procedure Laterality Date  . Mitral valve replacement  01/05/2007    Bioprosthetic 16mm Edwards precordial tissue (Dr. Servando Snare)  . Coronary artery bypass graft  01/05/2007    LIMA-LAD, SVG-OM, seq. SVG-PDA, PLA-RCA  . Colonoscopy  06/23/2011    Dr. Oneida Alar: multiple sessile and pedunculated polyps, moderate diverticulosis, internal hemorrhoids, +ADENOMATOUS POLYPS, consider genetic testing, surveillance in October 2015   . Esophagogastroduodenoscopy  02/12/2011    CHRONIC ACTIVE GASTRITIS, NO H. pylori  . Transthoracic echocardiogram  11/2011    EF 25% w/ mod dilatation of LV & mod LVH; LA severely dilated; mod-severe dilation of RV with mod-severe decrease in RV function; mild MR & TR  . Cardioversion  04/19/2007    Dr. Marella Chimes  . Transthoracic echocardiogram  02/14/2011    EF 40-45%, mod conc LVH; ventricular septum with motion showing abnormal function, dyssynergy, paradox; mild AS; mild-mod MR stenosis; LA severely dilated; aptrial septum bowed from L to R with increased LA pressure   . Amputation Right 07/14/2013    Procedure: AMPUTATION DIGIT;  Surgeon: [name], MD;  Location: [facility];  Service: General;  Laterality: Right;  . Amputation Right 08/08/2013    Procedure: AMPUTATION BELOW KNEE;  Surgeon: [name], MD;  Location: [facility];  Service: General;  Laterality: Right;  . Stump revision Right 09/19/2013    Procedure: STUMP REVISION;  Surgeon: Scherry Ran, MD;  Location: [facility];  Service: General;  Laterality: Right;  . Amputation Right 10/17/2013    Procedure: AMPUTATION ABOVE KNEE;  Surgeon: [name], MD;  Location: [facility];  Service: General;  Laterality: Right;  . Colonoscopy N/A  04/10/2014    Procedure: COLONOSCOPY;  Surgeon: [name], MD;  Location: [facility];  Service: Endoscopy;  Laterality: N/A;  830  . Tee without cardioversion N/A 08/19/2014    Procedure: TRANSESOPHAGEAL ECHOCARDIOGRAM (TEE);  Surgeon: [name], MD;  Location: [facility] ENDOSCOPY;  Service: Cardiovascular;  Laterality: N/A;  . Cardioversion N/A 08/19/2014    Procedure: CARDIOVERSION;  Surgeon: [name], MD;  Location: [facility] ENDOSCOPY;  Service: Cardiovascular;  Laterality: N/A;     Home Meds: Prior to Admission medications   Medication Sig Start Date End Date Taking? Authorizing Provider  albuterol (PROVENTIL) (2.5 MG/3ML) 0.083% nebulizer solution Take 3 mLs (2.5 mg total) by nebulization every 2 (two) hours as needed for wheezing. 02/21/14  Yes Kinnie Feil, MD  allopurinol (ZYLOPRIM) 100 MG tablet Take 100 mg by mouth daily.  05/15/11  Yes Historical Provider, MD  amiodarone (PACERONE) 200 MG tablet Take 1 tablet (200 mg total) by mouth daily. 08/31/14  Yes Satira Sark, MD  B Complex-C-Zn-Folic Acid (DIALYVITE/ZINC PO) Take by mouth 3 (three) times a week. Patient has dialysis on Tuesdays, Thursdays, and Saturdays   Yes Historical Provider, MD  FLUoxetine (PROZAC) 20 MG capsule Take 20 mg by  mouth daily. 09/21/14  Yes Historical Provider, MD  HYDROcodone-acetaminophen (NORCO/VICODIN) 5-325 MG per tablet Take 1 tablet by mouth every 6 (six) hours as needed for moderate pain. 08/06/14  Yes Carole Civil, MD  insulin glargine (LANTUS) 100 UNIT/ML injection Inject 10 Units into the skin at bedtime.   Yes Historical Provider, MD  metoprolol tartrate (LOPRESSOR) 25 MG tablet Take 0.5 tablets (12.5 mg total) by mouth 2 (two) times daily. 11/19/14  Yes Maryann Mikhail, DO  midodrine (PROAMATINE) 10 MG tablet Take 1 tablet (10 mg total) by mouth 2 (two) times daily with a meal. 08/20/14  Yes Silver Huguenin Elgergawy, MD  pantoprazole (PROTONIX) 40 MG tablet Take 40 mg by mouth 2 (two)  times daily.  05/25/11  Yes Historical Provider, MD  RENVELA 800 MG tablet Take 800 mg by mouth 3 (three) times daily with meals.  05/03/11  Yes Historical Provider, MD  SENSIPAR 30 MG tablet Take 30 mg by mouth daily. 01/22/14  Yes Historical Provider, MD  traMADol (ULTRAM) 50 MG tablet Take by mouth every 8 (eight) hours as needed for moderate pain.   Yes Historical Provider, MD  traZODone (DESYREL) 50 MG tablet Take 1 tablet (50 mg total) by mouth at bedtime. 08/13/13  Yes Nita Sells, MD  warfarin (COUMADIN) 1 MG tablet Take 0.5 mg by mouth See admin instructions. Take 1/2 tablet daily (0.5mg ) daily except nothing on Sunday and Wednesday.   Yes Historical Provider, MD  Nutritional Supplements (FEEDING SUPPLEMENT, NEPRO CARB STEADY,) LIQD Take 237 mLs by mouth as needed (missed meal during dialysis.). 11/19/14   Cristal Ford, DO    Inpatient Medications:  . allopurinol  100 mg Oral Daily  . amiodarone  200 mg Oral Daily  . antiseptic oral rinse  7 mL Mouth Rinse BID  . cinacalcet  30 mg Oral Q breakfast  . epoetin (EPOGEN/PROCRIT) injection  10,000 Units Intravenous Q T,Th,Sa-HD  . FLUoxetine  20 mg Oral Daily  . insulin aspart  0-5 Units Subcutaneous QHS  . insulin aspart  0-9 Units Subcutaneous TID WC  . insulin glargine  10 Units Subcutaneous QHS  . metoprolol tartrate  25 mg Oral BID  . midodrine  10 mg Oral BID WC  . pantoprazole  40 mg Oral BID  . piperacillin-tazobactam (ZOSYN)  IV  2.25 g Intravenous Q8H  . sevelamer carbonate  800 mg Oral TID WC  . sodium chloride  3 mL Intravenous Q12H  . traZODone  50 mg Oral QHS  . vancomycin  1,000 mg Intravenous Q T,Th,Sa-HD  . warfarin  0.5 mg Oral Once  . Warfarin - Pharmacist Dosing Inpatient   Does not apply Q24H   . sodium chloride 10 mL/hr at 12/10/14 1400  . diltiazem (CARDIZEM) infusion 5 mg/hr (12/10/14 1400)    Allergies:  Allergies  Allergen Reactions  . Bacitracin Other (See Comments)    unknown  . Norvasc  [Amlodipine Besylate] Other (See Comments)    unknown    History   Social History  . Marital Status: Married    Spouse Name: N/A  . Number of Children: 1  . Years of Education: N/A   Occupational History  . Retired     [place]  .     Social History Main Topics  . Smoking status: Former Smoker -- 1.00 packs/day for 8 years    Types: Cigarettes    Start date: 04/27/1969    Quit date: 11/27/2006  . Smokeless tobacco: Never Used  Comment: quit about 5 yrs  . Alcohol Use: No  . Drug Use: No  . Sexual Activity: Not on file   Other Topics Concern  . Not on file   Social History Narrative   ONE CHILD AGE 39-MALE. LIVES IN [place].     Family History  Problem Relation Age of Onset  . Diabetes Mother   . Diabetes Father   . Diabetes Sister   . Colon cancer Neg Hx   . Colon polyps Neg Hx      Review of Systems: All other systems reviewed and are otherwise negative except as noted above.  Labs:  Lab Results  Component Value Date   WBC 18.3* 12/10/2014   HGB 9.0* 12/10/2014   HCT 28.0* 12/10/2014   MCV 87.2 12/10/2014   PLT 246 12/10/2014    Recent Labs Lab 12/09/14 0438  NA 138  K 3.6  CL 102  CO2 26  BUN 23  CREATININE 4.49*  CALCIUM 7.8*  PROT 6.7  BILITOT 5.0*  ALKPHOS 129*  ALT 23  AST 43*  GLUCOSE 120*   Lab Results  Component Value Date   CHOL 60 03/02/2014   HDL 27* 03/02/2014   LDLCALC 22 03/02/2014   TRIG 54 03/02/2014   Radiology/Studies:  Mr Foot Left Wo Contrast  12/09/2014   CLINICAL DATA:  Disc pain and swelling of the left third toe with a draining wound on the plantar surface.  EXAM: MRI OF THE LEFT FOREFOOT WITHOUT CONTRAST  TECHNIQUE: Multiplanar, multisequence MR imaging was performed. No intravenous contrast was administered.  COMPARISON:  Plain films of the left foot 11/28/2014.  FINDINGS: A large ulceration is seen on the plantar surface of the foot at the level of the second and third MTP joints. No  underlying abscess is identified. No bone marrow signal abnormality to suggest osteomyelitis is identified. The ulceration appears to extend almost bone at the third MTP joint. Intrinsic musculature of the foot demonstrates atrophy. Mild subcutaneous edema is seen over the dorsum of the foot.  IMPRESSION: Deep ulceration on the plantar surface of the foot centered at the level of the third MTP joint without underlying abscess or osteomyelitis.   Electronically Signed   By: Inge Rise M.D.   On: 12/09/2014 16:13   [place] Lower Ext Art Left  12/10/2014   CLINICAL DATA:  64 year old male with decreased pulses and right above the knee amputation.  EXAM: UNILATERAL RIGHT LOWER EXTREMITY ARTERIAL DUPLEX SCAN  TECHNIQUE: Gray-scale sonography as well as color Doppler and duplex ultrasound was performed to evaluate the arteries of the lower extremity.  COMPARISON:  Prior CT pelvis 09/09/2014  FINDINGS: Sonographic interrogation of the right lower extremity vessels demonstrates severe ulcerated, irregular and calcified plaque beginning in the common femoral artery and extending to the ankle. Biphasic pulses are present within the common femoral and superficial femoral arteries. There is no evidence of focal stenosis. The deep femoral artery is patent.  The popliteal artery is heavily diseased and likely occluded for a short segment. Minimal flow is present within the anterior tibial and posterior tibial arteries.  IMPRESSION: 1. Heavily diseased popliteal artery with occlusion in the mid segment. 2. Very limited flow within the runoff vessels likely via collateral flow. 3. Irregular and calcified plaque throughout the common femoral and superficial femoral arteries without focal stenosis. 4. The profunda femoral artery remains patent. Signed,  Criselda Peaches, MD  [facility]  [facility]   Electronically Signed  By: Jacqulynn Cadet M.D.   On: 12/10/2014 15:40   Dg  Chest Port 1 View  12/04/2014   CLINICAL DATA:  Renal failure ; anemia  EXAM: PORTABLE CHEST - 1 VIEW  COMPARISON:  November 18, 2014 chest radiograph and chest CT November 09, 2014  FINDINGS: There is extensive airspace consolidation and loculated effusion throughout much of the right hemithorax, stable. There is underlying generalized interstitial edema. There is cardiomegaly with pulmonary venous hypertension. Patient is status post median sternotomy. No bone lesions are appreciable.  IMPRESSION: Underlying congestive heart failure. Extensive loculated effusion and airspace consolidation throughout much of the right lung. These findings were present previously and are not felt to be changed significantly.   Electronically Signed   By: Lowella Grip III M.D.   On: 12/15/2014 14:58    Dg Foot Complete Left  12/07/2014   CLINICAL DATA:  Left middle toe pain, dark area, patient on dialysis.  EXAM: LEFT FOOT - COMPLETE 3+ VIEW  COMPARISON:  None.  FINDINGS: Five views of left foot submitted. No acute fracture or subluxation. Diffuse osteopenia. Extensive atherosclerotic vascular calcifications. Plantar spur of calcaneus.  IMPRESSION: No acute fracture or subluxation. Diffuse osteopenia. Atherosclerotic vascular calcifications. Plantar spur of calcaneus.   Electronically Signed   By: Lahoma Crocker M.D.   On: 12/18/2014 14:58    Wt Readings from Last 3 Encounters:  12/10/14 196 lb 3.4 oz (89 kg)  12/09/14 191 lb (86.637 kg)  11/19/14 176 lb 2.4 oz (79.9 kg)   EKG: 12/04/2014: atrial fibrillation 147bpm with old anterior infarct, prolonged QT interval but QRS duration 168ms. Interestingly his last EKG had a RBBB morphology instead. However, looking back to prior EKGs his baseline morphology is the one seen during this admission.  Physical Exam: Blood pressure 119/97, pulse 25, temperature 98.1 F (36.7 C), temperature source Oral, resp. rate 17, height 6\' 3"  (1.905 m), weight 196 lb 3.4 oz (89 kg), SpO2 100  %. General: Well developed, well nourished AAM in no acute distress. Lying flat in bed without distress. Head: Normocephalic, atraumatic, sclera non-icteric, no xanthomas, nares are without discharge.  Neck:  JVD not elevated. Lungs: Diminished BS right base. Otherwise clear bilaterally to auscultation without wheezes, rales, or rhonchi. Breathing is unlabored. Heart: Irregularly irregular, tachycardic, with S1 S2. No murmurs, rubs, or gallops appreciated. Abdomen: Soft, non-tender, non-distended with normoactive bowel sounds. No hepatomegaly. No rebound/guarding. No obvious abdominal masses. Msk:  Strength and tone appear normal for age. Extremities: No clubbing or cyanosis. S/p R AKA. No edema.  Neuro: Alert and oriented X 3. No facial asymmetry. No focal deficit. Moves all extremities spontaneously. Psych:  Responds to questions appropriately with a normal affect.    Assessment and Plan:   1. Left lower extremity cellulitis/gangrene and sepsis with prior history of PVD s/p R AKA 2. Paroxysmal atrial fibrillation with RVR (in atrial fib likely since at least 10/2014) 3. ESRD on HD with hypotension during HD 4. CAD s/p CABG and bioprosthetic MVR 2008 5. Chronic systolic CHF EF 74% by TEE 07/2014 6. Intermittent RBBB 7. H/o orthostatic hypotension, on Proamantine 8. Recurrent loculated pleural effusion s/p chest tube 10/2014  According to the chart, the patient was last in NSR after TEE/DCCV in 07/2014. During last admission in 10/2014 for loculated pleural effusion he was back in atrial fibrillation. He was not seen by cardiology during that admission. He has remained in atrial fibrillation throughout this admission. Suspect intermittent tachycardia is driven by underlying clinical  picture of infection. WBC and lactate are rising again. Blood cultures pending. At this point given decreasing BP and low EF, will stop diltiazem and load with IV amiodarone bolus and drip. (D/w Dr. Aileen Fass who  has already entered order.) Continue BB as BP tolerates. He is not on ACE inhibitor or ARB due to hypotension. Doubt current role for electrical cardioversion as he is high risk for going back into atrial fib given underlying medical disturbance.  Signed, Melina Copa PA-C 12/10/2014, 4:39 PM Pager: [phone]   Attending note:  Patient seen and examined. Reviewed records and discussed the case with Ms. Purcell Mouton. Mr. Mano is a medically complex patient of mine, last seen in February of this year, at which time he was clinically stable from a cardiac perspective. Since last assessment on notes that he was admitted to the hospital in March with a right-sided loculated pleural effusion and required chest tube drainage. He was also noted to be in atrial fibrillation by ECG at that point, although rate controlled. He is currently admitted to the hospital with progressive leg pain and difficulty ambulating (has right AKA), subsequent findings of gangrene and cellulitis on the left, being managed with antibiotics and with potential surgery being discussed. He has been tachycardic since admission on April 12, ECGs reviewed and suggestive of atypical atrial flutter with 2:1 block, he has also had atrial fibrillation noted on telemetry. We are consulted to assist with his management. Outpatient regimen includes low-dose amiodarone and Coumadin as well as beta blocker therapy. Recent blood pressure has been low with hemodialysis, and he also has a history of orthostasis requiring ProAmatine. He remarkably does not have very many complaints at this time, specifically no chest pain or shortness of breath, generally a soreness in his left leg. On examination he appears relatively comfortable, lungs exhibit diminished breath sounds and cardiac exam reveals distant regular heart sounds. He is status post right AKA, edema and ulceration noted on the left foot. At this time our recommendation is to initiate IV amiodarone, stop  the diltiazem infusion, and try and continue beta blocker. He continues on Coumadin under direction of pharmacy with therapeutic INR. We do not plan to pursue a cardioversion attempt now as he is at high risk of recurring arrhythmia with current comorbidities. If surgery is being considered, it may be most reasonable to consider transferring him to the hospitalist team at [facility] with continued subspecialty follow-up, in light of his increased surgical risk.  Satira Sark, M.D., F.A.C.C.

## 2014-12-10 NOTE — Consult Note (Signed)
Patient seen, chart reviewed, full consult to follow.  U/S doppler of arteries, lower extremities, ordered.

## 2014-12-10 NOTE — Consult Note (Addendum)
Reason for Consult: End-stage disease Referring Physician: Dr. Ardell Isaacs [name] [name] is an 64 y.o. male.  HPI: He is a patient with history of diabetes, coronary artery disease status post bypass surgery, history of mitral valve replacement, history of atrial fibrillation with RVR, end-stage renal disease on maintenance hemodialysis presently came with complaints of pain and difficulty in walking using his left leg. Patient states that this pain is start about 4 days ago and seems to be worsening. Patient however when he was seen in dialysis unit he didn'[name] mention to anybody. When he was evaluated and was found to have gangrene/cellulitis of his 3rd . Presently patient is feeling better.  Past Medical History  Diagnosis Date  . UGI bleed 02/12/11    On anticoagulation; EGD [name]/[name] polypectomy-multiple polypoid lesions antrum(benign), Chronic active gastritis (NEGATIVE H pylori)  . Anemia, chronic disease   . Sepsis due to enterococcus 02/11/11  . Atrial fibrillation     On coumadin  . Coronary atherosclerosis of native coronary artery     Multivessel status post CABG  . Cardiomyopathy   . Type 2 diabetes mellitus   . Essential hypertension, benign   . Hyperlipemia   . Gout   . S/P patent foramen ovale closure   . Hyperbilirubinemia 08/10/2013  . SBO (small bowel obstruction) 01/2014  . 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 Clostridium difficile colitis 01/2014  . Diverticulosis   . Internal hemorrhoids   . ESRD on hemodialysis     Started 2008-2009. Dr [name] [name] on a TTS schedule - R forearm AVF for several years      Past Surgical History  Procedure Laterality Date  . Mitral valve replacement  01/05/2007    Bioprosthetic 28m [name] [name] (Dr. GServando [name]  . Coronary artery bypass graft  01/05/2007    LIMA-LAD, SVG-OM, seq. SVG-PDA, PLA-RCA  . Colonoscopy  06/23/2011    Dr. FOneida [name] multiple sessile and pedunculated polyps, moderate diverticulosis, internal hemorrhoids,  +ADENOMATOUS POLYPS, consider genetic testing, surveillance in October 2015   . Esophagogastroduodenoscopy  02/12/2011    CHRONIC ACTIVE GASTRITIS, NO H. pylori  . Transthoracic echocardiogram  11/2011    EF 25% [name]/ mod dilatation of LV & mod LVH; LA severely dilated; mod-severe dilation of RV with mod-severe decrease in RV function; mild MR & TR  . Cardioversion  04/19/2007    Dr. RMarella [name] . Transthoracic echocardiogram  02/14/2011    EF 40-45%, mod conc LVH; ventricular septum with motion showing abnormal function, dyssynergy, paradox; mild AS; mild-mod MR stenosis; LA severely dilated; aptrial septum bowed from L to R with increased LA pressure   . Amputation Right 07/14/2013    Procedure: AMPUTATION DIGIT;  Surgeon: [name] So MD;  Location: [facility];  Service: General;  Laterality: Right;  . Amputation Right 08/08/2013    Procedure: AMPUTATION BELOW KNEE;  Surgeon: [name] So MD;  Location: [facility];  Service: General;  Laterality: Right;  . Stump revision Right 09/19/2013    Procedure: STUMP REVISION;  Surgeon: WScherry Ran MD;  Location: [facility];  Service: General;  Laterality: Right;  . Amputation Right 10/17/2013    Procedure: AMPUTATION ABOVE KNEE;  Surgeon: [name] So MD;  Location: [facility];  Service: General;  Laterality: Right;  . Colonoscopy N/A 04/10/2014    Procedure: COLONOSCOPY;  Surgeon: SDanie Binder MD;  Location: [facility];  Service: Endoscopy;  Laterality: N/A;  830  . Tee without cardioversion N/A  08/19/2014    Procedure: TRANSESOPHAGEAL ECHOCARDIOGRAM (TEE);  Surgeon: [name] Perla, MD;  Location: [facility] ENDOSCOPY;  Service: Cardiovascular;  Laterality: N/A;  . Cardioversion N/A 08/19/2014    Procedure: CARDIOVERSION;  Surgeon: [name] Perla, MD;  Location: [facility] ENDOSCOPY;  Service: Cardiovascular;  Laterality: N/A;    Family History  Problem Relation Age of Onset  . Diabetes Mother   . Diabetes Father   . Diabetes Sister   . Colon cancer Neg  Hx   . Colon polyps Neg Hx     Social History:  reports that he quit smoking about 8 years ago. His smoking use included Cigarettes. He started smoking about 45 years ago. He has a 8 pack-year smoking history. He has never used smokeless tobacco. He reports that he does not drink alcohol or use illicit drugs.  Allergies:  Allergies  Allergen Reactions  . Bacitracin Other (See Comments)    unknown  . Norvasc [Amlodipine Besylate] Other (See Comments)    unknown    Medications: I have reviewed the patient's current medications.  Results for orders placed or performed during the hospital encounter of 12/21/2014 (from the past 48 hour(s))  CBC WITH DIFFERENTIAL     Status: Abnormal   Collection Time: 12/25/2014  2:15 PM  Result Value Ref Range   WBC 17.6 (H) 4.0 - 10.5 K/uL   RBC 3.49 (L) 4.22 - 5.81 MIL/uL   Hemoglobin 9.7 (L) 13.0 - 17.0 g/dL   HCT 31.0 (L) 39.0 - 52.0 %   MCV 88.8 78.0 - 100.0 fL   MCH 27.8 26.0 - 34.0 pg   MCHC 31.3 30.0 - 36.0 g/dL   RDW 16.9 (H) 11.5 - 15.5 %   Platelets 333 150 - 400 K/uL   Neutrophils Relative % 89 (H) 43 - 77 %   Neutro Abs 15.7 (H) 1.7 - 7.7 K/uL   Lymphocytes Relative 5 (L) 12 - 46 %   Lymphs Abs 0.8 0.7 - 4.0 K/uL   Monocytes Relative 6 3 - 12 %   Monocytes Absolute 1.1 (H) 0.1 - 1.0 K/uL   Eosinophils Relative 0 0 - 5 %   Eosinophils Absolute 0.1 0.0 - 0.7 K/uL   Basophils Relative 0 0 - 1 %   Basophils Absolute 0.0 0.0 - 0.1 K/uL  Comprehensive metabolic panel     Status: Abnormal   Collection Time: 12/11/2014  2:15 PM  Result Value Ref Range   Sodium 136 135 - 145 mmol/L   Potassium 3.8 3.5 - 5.1 mmol/L   Chloride 95 (L) 96 - 112 mmol/L   CO2 26 19 - 32 mmol/L   Glucose, Bld 193 (H) 70 - 99 mg/dL   BUN 20 6 - 23 mg/dL   Creatinine, Ser 3.96 (H) 0.50 - 1.35 mg/dL   Calcium 7.9 (L) 8.4 - 10.5 mg/dL   Total Protein 7.9 6.0 - 8.3 g/dL   Albumin 2.3 (L) 3.5 - 5.2 g/dL   AST 57 (H) 0 - 37 U/L   ALT 28 0 - 53 U/L   Alkaline  Phosphatase 155 (H) 39 - 117 U/L   Total Bilirubin 4.5 (H) 0.3 - 1.2 mg/dL   GFR calc non Af Amer 15 (L) >90 mL/min   GFR calc Af Amer 17 (L) >90 mL/min    Comment: (NOTE) The eGFR has been calculated using the CKD EPI equation. This calculation has not been validated in all clinical situations. eGFR's persistently <90 mL/min signify possible Chronic Kidney Disease.  Anion gap 15 5 - 15  Protime-INR     Status: Abnormal   Collection Time: 11/28/2014  2:15 PM  Result Value Ref Range   Prothrombin Time 20.0 (H) 11.6 - 15.2 seconds   INR 1.68 (H) 0.00 - 1.49  Blood Culture (routine x 2)     Status: None (Preliminary result)   Collection Time: 12/14/2014  2:48 PM  Result Value Ref Range   Specimen Description BLOOD LEFT FOREARM    Special Requests BOTTLES DRAWN AEROBIC ONLY 4CC    Culture NO GROWTH 1 DAY    Report Status PENDING   Blood Culture (routine x 2)     Status: None (Preliminary result)   Collection Time: 12/11/2014  3:00 PM  Result Value Ref Range   Specimen Description BLOOD RIGHT HAND    Special Requests      BOTTLES DRAWN AEROBIC AND ANAEROBIC AEB=6CC ANA=4CC   Culture NO GROWTH 1 DAY    Report Status PENDING   I-Stat CG4 Lactic Acid, ED (not at [facility])     Status: Abnormal   Collection Time: 12/26/2014  4:08 PM  Result Value Ref Range   Lactic Acid, Venous 6.45 (HH) 0.5 - 2.0 mmol/L   Comment NOTIFIED PHYSICIAN   MRSA PCR Screening     Status: None   Collection Time: 12/06/2014  6:40 PM  Result Value Ref Range   MRSA by PCR NEGATIVE NEGATIVE    Comment:        The GeneXpert MRSA Assay (FDA approved for NASAL specimens only), is one component of a comprehensive MRSA colonization surveillance program. It is not intended to diagnose MRSA infection nor to guide or monitor treatment for MRSA infections.   Glucose, capillary     Status: Abnormal   Collection Time: 12/24/2014  9:02 PM  Result Value Ref Range   Glucose-Capillary 151 (H) 70 - 99 mg/dL   Comment 1 Notify RN    Comprehensive metabolic panel     Status: Abnormal   Collection Time: 12/09/14  4:38 AM  Result Value Ref Range   Sodium 138 135 - 145 mmol/L   Potassium 3.6 3.5 - 5.1 mmol/L   Chloride 102 96 - 112 mmol/L   CO2 26 19 - 32 mmol/L   Glucose, Bld 120 (H) 70 - 99 mg/dL   BUN 23 6 - 23 mg/dL   Creatinine, Ser 4.49 (H) 0.50 - 1.35 mg/dL   Calcium 7.8 (L) 8.4 - 10.5 mg/dL   Total Protein 6.7 6.0 - 8.3 g/dL   Albumin 2.0 (L) 3.5 - 5.2 g/dL   AST 43 (H) 0 - 37 U/L   ALT 23 0 - 53 U/L   Alkaline Phosphatase 129 (H) 39 - 117 U/L   Total Bilirubin 5.0 (H) 0.3 - 1.2 mg/dL   GFR calc non Af Amer 13 (L) >90 mL/min   GFR calc Af Amer 15 (L) >90 mL/min    Comment: (NOTE) The eGFR has been calculated using the CKD EPI equation. This calculation has not been validated in all clinical situations. eGFR's persistently <90 mL/min signify possible Chronic Kidney Disease.    Anion gap 10 5 - 15  CBC     Status: Abnormal   Collection Time: 12/09/14  4:38 AM  Result Value Ref Range   WBC 13.8 (H) 4.0 - 10.5 K/uL   RBC 3.33 (L) 4.22 - 5.81 MIL/uL   Hemoglobin 9.3 (L) 13.0 - 17.0 g/dL   HCT 29.4 (L) 39.0 - 52.0 %  MCV 88.3 78.0 - 100.0 fL   MCH 27.9 26.0 - 34.0 pg   MCHC 31.6 30.0 - 36.0 g/dL   RDW 16.8 (H) 11.5 - 15.5 %   Platelets 219 150 - 400 K/uL    Comment: DELTA CHECK NOTED  Protime-INR     Status: Abnormal   Collection Time: 12/09/14  4:38 AM  Result Value Ref Range   Prothrombin Time 20.5 (H) 11.6 - 15.2 seconds   INR 1.74 (H) 0.00 - 1.49  Glucose, capillary     Status: Abnormal   Collection Time: 12/09/14  7:33 AM  Result Value Ref Range   Glucose-Capillary 104 (H) 70 - 99 mg/dL   Comment 1 Notify RN    Comment 2 Document in Chart   Lactic acid, plasma     Status: None   Collection Time: 12/09/14  8:56 AM  Result Value Ref Range   Lactic Acid, Venous 1.5 0.5 - 2.0 mmol/L  Glucose, capillary     Status: Abnormal   Collection Time: 12/09/14 11:37 AM  Result Value Ref Range    Glucose-Capillary 136 (H) 70 - 99 mg/dL   Comment 1 Notify RN    Comment 2 Document in Chart   Lactic acid, plasma     Status: Abnormal   Collection Time: 12/09/14 12:44 PM  Result Value Ref Range   Lactic Acid, Venous 2.2 (HH) 0.5 - 2.0 mmol/L    Comment: RESULT REPEATED AND VERIFIED CRITICAL RESULT CALLED TO, READ BACK BY AND VERIFIED WITH: HILTON,L. AT 1445 BY HUFFINES,S ON 12/09/14.   Glucose, capillary     Status: None   Collection Time: 12/09/14  4:22 PM  Result Value Ref Range   Glucose-Capillary 87 70 - 99 mg/dL   Comment 1 Notify RN    Comment 2 Document in Chart   Glucose, capillary     Status: Abnormal   Collection Time: 12/09/14  9:29 PM  Result Value Ref Range   Glucose-Capillary 115 (H) 70 - 99 mg/dL  Protime-INR     Status: Abnormal   Collection Time: 12/10/14  4:54 AM  Result Value Ref Range   Prothrombin Time 23.3 (H) 11.6 - 15.2 seconds   INR 2.05 (H) 0.00 - 1.49  CBC     Status: Abnormal   Collection Time: 12/10/14  4:54 AM  Result Value Ref Range   WBC 18.3 (H) 4.0 - 10.5 K/uL   RBC 3.21 (L) 4.22 - 5.81 MIL/uL   Hemoglobin 9.0 (L) 13.0 - 17.0 g/dL   HCT 28.0 (L) 39.0 - 52.0 %   MCV 87.2 78.0 - 100.0 fL   MCH 28.0 26.0 - 34.0 pg   MCHC 32.1 30.0 - 36.0 g/dL   RDW 16.8 (H) 11.5 - 15.5 %   Platelets 246 150 - 400 K/uL  Glucose, capillary     Status: None   Collection Time: 12/10/14  8:07 AM  Result Value Ref Range   Glucose-Capillary 80 70 - 99 mg/dL   Comment 1 Notify RN    Comment 2 Document in Chart   Lactic acid, plasma     Status: None   Collection Time: 12/10/14  8:11 AM  Result Value Ref Range   Lactic Acid, Venous 1.1 0.5 - 2.0 mmol/L    Mr Foot Left Wo Contrast  12/09/2014   CLINICAL DATA:  Disc pain and swelling of the left third toe with a draining wound on the plantar surface.  EXAM: MRI OF THE LEFT FOREFOOT WITHOUT  CONTRAST  TECHNIQUE: Multiplanar, multisequence MR imaging was performed. No intravenous contrast was administered.   COMPARISON:  Plain films of the left foot 12/22/2014.  FINDINGS: A large ulceration is seen on the plantar surface of the foot at the level of the second and third MTP joints. No underlying abscess is identified. No bone marrow signal abnormality to suggest osteomyelitis is identified. The ulceration appears to extend almost bone at the third MTP joint. Intrinsic musculature of the foot demonstrates atrophy. Mild subcutaneous edema is seen over the dorsum of the foot.  IMPRESSION: Deep ulceration on the plantar surface of the foot centered at the level of the third MTP joint without underlying abscess or osteomyelitis.   Electronically Signed   By: Inge Rise M.D.   On: 12/09/2014 16:13   Dg Chest Port 1 View  12/05/2014   CLINICAL DATA:  Renal failure ; anemia  EXAM: PORTABLE CHEST - 1 VIEW  COMPARISON:  November 18, 2014 chest radiograph and chest CT November 09, 2014  FINDINGS: There is extensive airspace consolidation and loculated effusion throughout much of the right hemithorax, stable. There is underlying generalized interstitial edema. There is cardiomegaly with pulmonary venous hypertension. Patient is status post median sternotomy. No bone lesions are appreciable.  IMPRESSION: Underlying congestive heart failure. Extensive loculated effusion and airspace consolidation throughout much of the right lung. These findings were present previously and are not felt to be changed significantly.   Electronically Signed   By: Lowella Grip III M.D.   On: 12/02/2014 14:58   Dg Foot Complete Left  12/19/2014   CLINICAL DATA:  Left middle toe pain, dark area, patient on dialysis.  EXAM: LEFT FOOT - COMPLETE 3+ VIEW  COMPARISON:  None.  FINDINGS: Five views of left foot submitted. No acute fracture or subluxation. Diffuse osteopenia. Extensive atherosclerotic vascular calcifications. Plantar spur of calcaneus.  IMPRESSION: No acute fracture or subluxation. Diffuse osteopenia. Atherosclerotic vascular  calcifications. Plantar spur of calcaneus.   Electronically Signed   By: Lahoma Crocker M.D.   On: 12/11/2014 14:58    Review of Systems  Constitutional: Positive for chills. Negative for fever.  Respiratory: Negative for shortness of breath.   Cardiovascular: Negative for orthopnea and claudication.       Irregular rate and rhythm and 2/6 systolic ejection murmur  Gastrointestinal: Positive for nausea. Negative for vomiting and abdominal pain.   Blood pressure 94/65, pulse 40, temperature 98.8 [name] (37.1 C), temperature source Oral, resp. rate 17, height 6' 3"  (1.905 m), weight 88.9 kg (195 lb 15.8 oz), SpO2 100 %. Physical Exam  Constitutional: He is oriented to person, place, and time. No distress.  Eyes: No scleral icterus.  Neck: No JVD present.  Cardiovascular: Normal rate.   Murmur heard. Respiratory: He has no wheezes. He has no rales.  Musculoskeletal: He exhibits edema.  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    Assessment/Plan: Problem #1 cellulitis/gangrene of his left toe. Patient presently afebrile but with elevated white blood cell count. He is on antibiotics. Problem #2 end-stage renal disease he status post hemodialysis on Tuesday. Patient is due for dialysis today. He has some nausea but no vomiting. Problem #3 history of mitral valve replacement Problem #4 history of cardiomyopathy. He denies any difficulty breathing but patient with some edema. Problem #5 anemia: His hemoglobin is below our target goal. Problem #6 history of diabetes Problem #7 history of atrial fibrillation:  His heart rate is controlled. Plan: We'll make arrangements for patient to get dialysis today We'll check his basic metabolic panel, CBC and phosphorus in the morning. We'll continue his Epogen 10,000 units IV after each dialysis.  Adriann Ballweg S 12/10/2014, 9:32 AM

## 2014-12-10 NOTE — Progress Notes (Signed)
[place] for Warfarin Indication: atrial fibrillation  Allergies  Allergen Reactions  . Bacitracin Other (See Comments)    unknown  . Norvasc [Amlodipine Besylate] Other (See Comments)    unknown   Patient Measurements: Height: 6\' 3"  (190.5 cm) Weight: 195 lb 15.8 oz (88.9 kg) IBW/kg (Calculated) : 84.5  Vital Signs: Temp: 98.8 F (37.1 C) (04/14 0822) Temp Source: Oral (04/14 0822) BP: 113/82 mmHg (04/14 0900) Pulse Rate: 34 (04/14 0900)  Labs:  Recent Labs  12/07/2014 1415 12/09/14 0438 12/10/14 0454  HGB 9.7* 9.3* 9.0*  HCT 31.0* 29.4* 28.0*  PLT 333 219 246  LABPROT 20.0* 20.5* 23.3*  INR 1.68* 1.74* 2.05*  CREATININE 3.96* 4.49*  --    Estimated Creatinine Clearance: 19.6 mL/min (by C-G formula based on Cr of 4.49).  Medical History: Past Medical History  Diagnosis Date  . UGI bleed 02/12/11    On anticoagulation; EGD w/snare polypectomy-multiple polypoid lesions antrum(benign), Chronic active gastritis (NEGATIVE H pylori)  . Anemia, chronic disease   . Sepsis due to enterococcus 02/11/11  . Atrial fibrillation     On coumadin  . Coronary atherosclerosis of native coronary artery     Multivessel status post CABG  . Cardiomyopathy   . Type 2 diabetes mellitus   . Essential hypertension, benign   . Hyperlipemia   . Gout   . S/P patent foramen ovale closure   . Hyperbilirubinemia 08/10/2013  . SBO (small bowel obstruction) 01/2014  . 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 Clostridium difficile colitis 01/2014  . Diverticulosis   . Internal hemorrhoids   . ESRD on hemodialysis     Started 2008-2009. Dr Hinda Lenis on a TTS schedule - R forearm AVF for several years     Medications:    Medications Prior to Admission  Medication Sig Dispense Refill  . albuterol (PROVENTIL) (2.5 MG/3ML) 0.083% nebulizer solution Take 3 mLs (2.5 mg total) by nebulization every 2 (two) hours as needed for wheezing. 75 mL 12  . allopurinol  (ZYLOPRIM) 100 MG tablet Take 100 mg by mouth daily.     Marland Kitchen amiodarone (PACERONE) 200 MG tablet Take 1 tablet (200 mg total) by mouth daily.    . B Complex-C-Zn-Folic Acid (DIALYVITE/ZINC PO) Take by mouth 3 (three) times a week. Patient has dialysis on Tuesdays, Thursdays, and Saturdays    . FLUoxetine (PROZAC) 20 MG capsule Take 20 mg by mouth daily.    Marland Kitchen HYDROcodone-acetaminophen (NORCO/VICODIN) 5-325 MG per tablet Take 1 tablet by mouth every 6 (six) hours as needed for moderate pain. 100 tablet 0  . insulin glargine (LANTUS) 100 UNIT/ML injection Inject 10 Units into the skin at bedtime.    . metoprolol tartrate (LOPRESSOR) 25 MG tablet Take 0.5 tablets (12.5 mg total) by mouth 2 (two) times daily. 30 tablet 0  . midodrine (PROAMATINE) 10 MG tablet Take 1 tablet (10 mg total) by mouth 2 (two) times daily with a meal.    . pantoprazole (PROTONIX) 40 MG tablet Take 40 mg by mouth 2 (two) times daily.     Marland Kitchen RENVELA 800 MG tablet Take 800 mg by mouth 3 (three) times daily with meals.     . SENSIPAR 30 MG tablet Take 30 mg by mouth daily.    . traMADol (ULTRAM) 50 MG tablet Take by mouth every 8 (eight) hours as needed for moderate pain.    . traZODone (DESYREL) 50 MG tablet Take 1 tablet (50 mg  total) by mouth at bedtime. 30 tablet 0  . warfarin (COUMADIN) 1 MG tablet Take 0.5 mg by mouth See admin instructions. Take 1/2 tablet daily (0.5mg ) daily except nothing on Sunday and Wednesday.    . Nutritional Supplements (FEEDING SUPPLEMENT, NEPRO CARB STEADY,) LIQD Take 237 mLs by mouth as needed (missed meal during dialysis.).  0    Assessment: 64 yo M with ESRD on chronic Coumadin for Afib.  INR below goal on admission, but within goal range today after increased warfarin dose.  Per outpatient AC flow sheet, patient has had fluctuating INR and multiple dose changes since last admission in March.  Current home dose listed above.   No bleeding noted.   Goal of Therapy:  INR 2-3   Plan:  Warfarin  0.5mg  PO x 1 today (per home regimen) Daily PT/INR  Biagio Borg 12/10/2014,10:21 AM

## 2014-12-10 NOTE — Progress Notes (Signed)
Called cardiology, secretary answered and stated she would let the doctor know of consult for A-Fib.

## 2014-12-10 NOTE — Clinical Documentation Improvement (Signed)
MD's, NP's, and PA's   Documentation of "unspecified Atrial Fibrillation" if possible please provide greater specificity of the type of Atrial Fibrillation.  Thank you    Possible Clinical Conditions?  Please clarify the type of atrial fibrillation:  ? Chronic ? Paroxysmal ? Permanent ? Persistent ? Other (please specify type) ? Unable to determine ? Unknown   Treatment: cont home dose of amiodarone and lopressor  Thank You, Ree Kida ,RN Clinical Documentation Specialist:  [place] [facility]

## 2014-12-10 NOTE — Procedures (Signed)
   HEMODIALYSIS TREATMENT NOTE:  4 hour heparin-free dialysis completed via right forearm AVF (15g ante/retrograde).  Goal NOT met:  BP unable to tolerate removal of 2 liters as ordered. Ultrafiltration was stopped whenever SBP<90; UF interrupted for 1.5 hours total.  720cc removed.  Pt asymptomatic with hypotension and tachycardia 120s.  All blood was reinfused and dialyzer was noted to be jaundiced.  Report given to Loni Muse, RN.  Rockwell Alexandria, RN, CDN

## 2014-12-10 NOTE — Clinical Documentation Improvement (Signed)
MD's, NP's, and PA'S   Patient with history of "Secondary Cardiomyopathy"  does this diagnosis apply to this admission.    Please clarify if the following diagnosis  Secondary Cardiomyopathy was:     Marland Kitchen Present at the time of admission . NOT present at the time of inpatient admission and it developed during the inpatient stay . Unable to clinically determine whether the condition was present on admission. . Documentation insufficient to determine if condition was present at the time of inpatient admission    Medicare rules require specification as to whether an inpatient diagnosis was present at the time of admission.      Thank You, Ree Kida ,RN Clinical Documentation Specialist:  [place] [facility]

## 2014-12-10 NOTE — Progress Notes (Signed)
Patient has two quarter size stage II ulcers on bottom of foot that are about 61mm deep. Did place gause between toes and lightly covered with kerlex then large sock

## 2014-12-10 NOTE — Progress Notes (Signed)
Patient is dialysis patient and Dr. Olevia Bowens ordered nephro consult this AM. Dr. Fran Lowes, MD was on the floor and I notified him of consult.

## 2014-12-10 NOTE — Progress Notes (Addendum)
[facility] PROGRESS NOTE Interim History: 64 year old male with past medical history of diabetes, atrial fibrillation on Coumadin, end-stage renal disease that comes in left foot cellulitis and gangrene. MRI was done that showed results as below surgery has been consulted. Filed Weights   12/09/2014 1840 12/09/14 0500 12/10/14 0500  Weight: 85.9 kg (189 lb 6 oz) 86.7 kg (191 lb 2.2 oz) 88.9 kg (195 lb 15.8 oz)        Intake/Output Summary (Last 24 hours) at 12/10/14 0753 Last data filed at 12/10/14 0600  Gross per 24 hour  Intake 1930.92 ml  Output      2 ml  Net 1928.92 ml     Assessment/Plan: Sepsis due to Cellulitis of left foot and gangrene: - Started empirically on vancomycin and Zosyn on 4.13.2016, surgery consulted renal lesions are pending. - MRIs below. - KVO this due to his renal disease. - He has mild JVD on physical exam lungs are clear. - Transfer to telemetry.  Atrial fibrillation with RVR - At home he is on amiodarone which was continued along with metoprolol. - We'll increase metoprolol, will bolus Amio and cont infusion and consult cardiology Continue Coumadin per pharmacy. - Inr therapeutic.  Recurrent pleural effusion on right: - Appears to be chronic asymptomatic.  ESRD (end stage renal disease) on dialysis: - Nephrology has already been consulted Dr. Bing Quarry, awaiting recommendations.  Diabetes mellitus with diabetic nephropathy: - We'll control in house continue current therapy.    Code Status: full Code Family Communication: None  Disposition Plan: Unable to determine, probably 5-6 days.   Consultants:  Surgery  renal  Procedures: ECHO: none  Antibiotics:  vanc and zosyn 4.12.2016  HPI/Subjective: No shortness of breath he relates his pain is better.  Objective: Filed Vitals:   12/10/14 0400 12/10/14 0500 12/10/14 0600 12/10/14 0700  BP: 106/60 100/66 99/60 94/65   Pulse: 69  102 40  Temp: 98.3 F (36.8 C)       TempSrc: Oral     Resp: 28 16 22 17   Height:      Weight:  88.9 kg (195 lb 15.8 oz)    SpO2: 100%  95% 100%     Exam:  General: Alert, awake, oriented x3, in no acute distress.  HEENT: No bruits, no goiter. +JVD Heart: Regular rate and rhythm. Lungs: Good air movement, clear. Abdomen: Soft, nontender, nondistended, positive bowel sounds.  Neuro: Grossly intact, nonfocal.   Data Reviewed: Basic Metabolic Panel:  Recent Labs Lab 12/14/2014 1415 12/09/14 0438  NA 136 138  K 3.8 3.6  CL 95* 102  CO2 26 26  GLUCOSE 193* 120*  BUN 20 23  CREATININE 3.96* 4.49*  CALCIUM 7.9* 7.8*   Liver Function Tests:  Recent Labs Lab 12/07/2014 1415 12/09/14 0438  AST 57* 43*  ALT 28 23  ALKPHOS 155* 129*  BILITOT 4.5* 5.0*  PROT 7.9 6.7  ALBUMIN 2.3* 2.0*   No results for input(s): LIPASE, AMYLASE in the last 168 hours. No results for input(s): AMMONIA in the last 168 hours. CBC:  Recent Labs Lab 11/28/2014 1415 12/09/14 0438  WBC 17.6* 13.8*  NEUTROABS 15.7*  --   HGB 9.7* 9.3*  HCT 31.0* 29.4*  MCV 88.8 88.3  PLT 333 219   Cardiac Enzymes: No results for input(s): CKTOTAL, CKMB, CKMBINDEX, TROPONINI in the last 168 hours. BNP (last 3 results) No results for input(s): BNP in the last 8760 hours.  ProBNP (last 3 results)  Recent Labs  08/15/14  0245  PROBNP >70000.0*    CBG:  Recent Labs Lab 12/04/2014 2102 12/09/14 0733 12/09/14 1137 12/09/14 1622 12/09/14 2129  GLUCAP 151* 104* 136* 87 115*    Recent Results (from the past 240 hour(s))  Blood Culture (routine x 2)     Status: None (Preliminary result)   Collection Time: 12/04/2014  2:48 PM  Result Value Ref Range Status   Specimen Description BLOOD LEFT FOREARM  Final   Special Requests BOTTLES DRAWN AEROBIC ONLY 4CC  Final   Culture NO GROWTH 1 DAY  Final   Report Status PENDING  Incomplete  Blood Culture (routine x 2)     Status: None (Preliminary result)   Collection Time: 12/12/2014  3:00 PM   Result Value Ref Range Status   Specimen Description BLOOD RIGHT HAND  Final   Special Requests   Final    BOTTLES DRAWN AEROBIC AND ANAEROBIC AEB=6CC ANA=4CC   Culture NO GROWTH 1 DAY  Final   Report Status PENDING  Incomplete  MRSA PCR Screening     Status: None   Collection Time: 12/02/2014  6:40 PM  Result Value Ref Range Status   MRSA by PCR NEGATIVE NEGATIVE Final    Comment:        The GeneXpert MRSA Assay (FDA approved for NASAL specimens only), is one component of a comprehensive MRSA colonization surveillance program. It is not intended to diagnose MRSA infection nor to guide or monitor treatment for MRSA infections.      Studies: Mr Foot Left Wo Contrast  12/09/2014   CLINICAL DATA:  Disc pain and swelling of the left third toe with a draining wound on the plantar surface.  EXAM: MRI OF THE LEFT FOREFOOT WITHOUT CONTRAST  TECHNIQUE: Multiplanar, multisequence MR imaging was performed. No intravenous contrast was administered.  COMPARISON:  Plain films of the left foot 12/13/2014.  FINDINGS: A large ulceration is seen on the plantar surface of the foot at the level of the second and third MTP joints. No underlying abscess is identified. No bone marrow signal abnormality to suggest osteomyelitis is identified. The ulceration appears to extend almost bone at the third MTP joint. Intrinsic musculature of the foot demonstrates atrophy. Mild subcutaneous edema is seen over the dorsum of the foot.  IMPRESSION: Deep ulceration on the plantar surface of the foot centered at the level of the third MTP joint without underlying abscess or osteomyelitis.   Electronically Signed   By: Inge Rise M.D.   On: 12/09/2014 16:13   Dg Chest Port 1 View  12/07/2014   CLINICAL DATA:  Renal failure ; anemia  EXAM: PORTABLE CHEST - 1 VIEW  COMPARISON:  November 18, 2014 chest radiograph and chest CT November 09, 2014  FINDINGS: There is extensive airspace consolidation and loculated effusion throughout  much of the right hemithorax, stable. There is underlying generalized interstitial edema. There is cardiomegaly with pulmonary venous hypertension. Patient is status post median sternotomy. No bone lesions are appreciable.  IMPRESSION: Underlying congestive heart failure. Extensive loculated effusion and airspace consolidation throughout much of the right lung. These findings were present previously and are not felt to be changed significantly.   Electronically Signed   By: Lowella Grip III M.D.   On: 12/09/2014 14:58   Dg Foot Complete Left  12/20/2014   CLINICAL DATA:  Left middle toe pain, dark area, patient on dialysis.  EXAM: LEFT FOOT - COMPLETE 3+ VIEW  COMPARISON:  None.  FINDINGS: Five views of left  foot submitted. No acute fracture or subluxation. Diffuse osteopenia. Extensive atherosclerotic vascular calcifications. Plantar spur of calcaneus.  IMPRESSION: No acute fracture or subluxation. Diffuse osteopenia. Atherosclerotic vascular calcifications. Plantar spur of calcaneus.   Electronically Signed   By: Lahoma Crocker M.D.   On: 12/04/2014 14:58    Scheduled Meds: . allopurinol  100 mg Oral Daily  . amiodarone  200 mg Oral Daily  . antiseptic oral rinse  7 mL Mouth Rinse BID  . cinacalcet  30 mg Oral Q breakfast  . FLUoxetine  20 mg Oral Daily  . insulin aspart  0-5 Units Subcutaneous QHS  . insulin aspart  0-9 Units Subcutaneous TID WC  . insulin glargine  10 Units Subcutaneous QHS  . metoprolol tartrate  12.5 mg Oral BID  . midodrine  10 mg Oral BID WC  . pantoprazole  40 mg Oral BID  . piperacillin-tazobactam (ZOSYN)  IV  2.25 g Intravenous Q8H  . sevelamer carbonate  800 mg Oral TID WC  . sodium chloride  3 mL Intravenous Q12H  . traZODone  50 mg Oral QHS  . vancomycin  1,000 mg Intravenous Q T,Th,Sa-HD  . Warfarin - Pharmacist Dosing Inpatient   Does not apply Q24H   Continuous Infusions: . sodium chloride 10 mL/hr at 12/10/14 0701  . diltiazem (CARDIZEM) infusion 5 mg/hr  (12/10/14 0701)    Time spent with him 20 minutes Charlynne Cousins  [facility] Pager [phone] If 7PM-7AM, please contact night-coverage at [email or web address], password [facility] 12/10/2014, 7:53 AM  LOS: 2 days

## 2014-12-11 LAB — BASIC METABOLIC PANEL
Anion gap: 11 (ref 5–15)
BUN: 17 mg/dL (ref 6–23)
CO2: 27 mmol/L (ref 19–32)
CREATININE: 4.03 mg/dL — AB (ref 0.50–1.35)
Calcium: 7.9 mg/dL — ABNORMAL LOW (ref 8.4–10.5)
Chloride: 100 mmol/L (ref 96–112)
GFR calc non Af Amer: 14 mL/min — ABNORMAL LOW (ref >90)
GFR, EST AFRICAN AMERICAN: 17 mL/min — AB (ref >90)
GLUCOSE: 109 mg/dL — AB (ref 70–99)
Potassium: 3.8 mmol/L (ref 3.5–5.1)
SODIUM: 138 mmol/L (ref 135–145)

## 2014-12-11 LAB — HEPATITIS B SURFACE ANTIGEN: Hepatitis B Surface Ag: NEGATIVE

## 2014-12-11 LAB — GLUCOSE, CAPILLARY
GLUCOSE-CAPILLARY: 99 mg/dL (ref 70–99)
GLUCOSE-CAPILLARY: 113 mg/dL — AB (ref 70–99)
Glucose-Capillary: 163 mg/dL — ABNORMAL HIGH (ref 70–99)
Glucose-Capillary: 155 mg/dL — ABNORMAL HIGH (ref 70–99)

## 2014-12-11 LAB — CLOSTRIDIUM DIFFICILE BY PCR: Toxigenic C. Difficile by PCR: POSITIVE — AB

## 2014-12-11 LAB — CBC
HEMATOCRIT: 26.8 % — AB (ref 39.0–52.0)
Hemoglobin: 8.6 g/dL — ABNORMAL LOW (ref 13.0–17.0)
MCH: 27.9 pg (ref 26.0–34.0)
MCHC: 32.1 g/dL (ref 30.0–36.0)
MCV: 87 fL (ref 78.0–100.0)
PLATELETS: 233 10*3/uL (ref 150–400)
RBC: 3.08 MIL/uL — AB (ref 4.22–5.81)
RDW: 17 % — ABNORMAL HIGH (ref 11.5–15.5)
WBC: 18.3 10*3/uL — ABNORMAL HIGH (ref 4.0–10.5)

## 2014-12-11 LAB — PHOSPHORUS: PHOSPHORUS: 2.5 mg/dL (ref 2.3–4.6)

## 2014-12-11 LAB — LACTIC ACID, PLASMA: Lactic Acid, Venous: 1.5 mmol/L (ref 0.5–2.0)

## 2014-12-11 LAB — PROTIME-INR
INR: 2.72 — ABNORMAL HIGH (ref 0.00–1.49)
PROTHROMBIN TIME: 29.1 s — AB (ref 11.6–15.2)

## 2014-12-11 LAB — HEPATITIS B SURFACE ANTIBODY,QUALITATIVE: HEP B S AB: REACTIVE

## 2014-12-11 MED ORDER — METRONIDAZOLE 500 MG PO TABS
500.0000 mg | ORAL_TABLET | Freq: Three times a day (TID) | ORAL | Status: DC
  Administered 2014-12-11: 500 mg via ORAL
  Filled 2014-12-11 (×3): qty 1

## 2014-12-11 MED ORDER — VANCOMYCIN 50 MG/ML ORAL SOLUTION
125.0000 mg | Freq: Four times a day (QID) | ORAL | Status: DC
  Filled 2014-12-11 (×2): qty 2.5

## 2014-12-11 MED ORDER — METOPROLOL TARTRATE 1 MG/ML IV SOLN
5.0000 mg | Freq: Four times a day (QID) | INTRAVENOUS | Status: DC | PRN
  Filled 2014-12-11: qty 5

## 2014-12-11 MED ORDER — SODIUM CHLORIDE 0.9 % IV BOLUS (SEPSIS)
500.0000 mL | Freq: Once | INTRAVENOUS | Status: AC
  Administered 2014-12-11: 500 mL via INTRAVENOUS

## 2014-12-11 MED ORDER — ACETAMINOPHEN 325 MG PO TABS
650.0000 mg | ORAL_TABLET | Freq: Once | ORAL | Status: AC
  Administered 2014-12-11: 650 mg via ORAL
  Filled 2014-12-11: qty 2

## 2014-12-11 MED ORDER — VANCOMYCIN 50 MG/ML ORAL SOLUTION
125.0000 mg | Freq: Four times a day (QID) | ORAL | Status: DC
  Administered 2014-12-11 – 2014-12-22 (×37): 125 mg via ORAL
  Filled 2014-12-11 (×51): qty 2.5

## 2014-12-11 MED ORDER — MIDODRINE HCL 5 MG PO TABS
10.0000 mg | ORAL_TABLET | Freq: Three times a day (TID) | ORAL | Status: DC
  Administered 2014-12-12 – 2014-12-22 (×33): 10 mg via ORAL
  Filled 2014-12-11 (×38): qty 2

## 2014-12-11 MED ORDER — SODIUM CHLORIDE 0.9 % IV BOLUS (SEPSIS)
250.0000 mL | Freq: Once | INTRAVENOUS | Status: AC
  Administered 2014-12-11: 250 mL via INTRAVENOUS

## 2014-12-11 MED ORDER — SODIUM CHLORIDE 0.9 % IV BOLUS (SEPSIS): 500.0000 mL | Freq: Once | INTRAVENOUS | Status: DC

## 2014-12-11 MED ORDER — FAMOTIDINE 20 MG PO TABS
20.0000 mg | ORAL_TABLET | Freq: Two times a day (BID) | ORAL | Status: DC
  Administered 2014-12-11 – 2014-12-12 (×2): 20 mg via ORAL
  Filled 2014-12-11 (×3): qty 1

## 2014-12-11 NOTE — Progress Notes (Signed)
[facility] PROGRESS NOTE Interim History: 64 year old male with past medical history CAD status post CABG 2008, with mitral valve replacement (bioprostetic), of diabetes mellitus, atrial fibrillation on Coumadin and amiodarone status post cardioversion in 2015, end-stage renal disease, chronic systolic heart failure with an EF of 15% in 2015, right AKA, right chronic loculated effusion came to [facility] for left foot pain and was found to have that comes in left foot cellulitis and gangrene and sepsis with lactic acid of 6 within improvement to 2.2 after IV hydration. She was started on vancomycin and Zosyn, his blood pressure remained stable but he remained with significant leukocytosis. MRI was done that showed results as below surgery has been consulted. ABI were order and showed Heavily diseased popliteal artery with occlusion in the mid segment  Very limited flow within the runoff vessels likely via collateral. He was started on IV diltiazem on admission for his A. fib with RVR on 12/10/2014 it was hard to control his HR and  he was started on IV amiodarone and cardiology was consulted. He continue hemodialysis per 12/10/2014 after after dialysis he became hypotensive and had to be given a 500 mL bolus of normal saline and bp stabilize. Cardiology and  IM deemed him high risk of surgery. So will transfer to [facility] Weights   12/10/14 0500 12/10/14 1215 12/11/14 0500  Weight: 88.9 kg (195 lb 15.8 oz) 89 kg (196 lb 3.4 oz) 89.5 kg (197 lb 5 oz)        Intake/Output Summary (Last 24 hours) at 12/11/14 0731 Last data filed at 12/11/14 0600  Gross per 24 hour  Intake 1494.72 ml  Output    720 ml  Net 774.72 ml     Assessment/Plan: Sepsis due to Cellulitis of left foot and gangrene (he is has a right AKA): - Started empirically on vancomycin and Zosyn on 4.13.2016, MRI was done that showed deep ulceration without abscess or osteomyelitis.  - Surgery consulted and  recommended ABIs that showed results as above. - His lactic acidosis improve after hydration and then worsened to 2.2. - He developed developed hypotension on 4.14.2016, 500 mL bolus of NS given and his blood pressure stabilized. - I have already talked with orthopedic surgeon Dr. Ninfa Linden who will see the patient once he gets to [facility]. - Surgery here at [facility] recommended amputation of the left extremity average discussed this with the patient and he agrees. - The patient is high risk for cardiopulmonary complications recommend transferring to [facility]. - Cultures have remained negative 2. Monocytosis has not improved.  Atrial fibrillation with RVR: - At home he is on amiodarone which was continued along with metoprolol. - He was started on IV diltiazem with heart control heart rate, stop on 12/10/2014. - INR is therapeutic, on 12/10/2014 his A. fib with RVR went into the 150s he was started on IV amiodarone and cardiology was consulted. - Cardiology came to high risk for surgery and recommended transfer to [facility]. - We'll hold his Coumadin for possible surgery.  Recurrent pleural effusion on right: - Appears to be chronic asymptomatic.  ESRD (end stage renal disease) on dialysis: - Nephrology has already been consulted Dr. Bing Quarry. - He is status post before HD 2, on his first hemodialysis only 2.4 L will be removed as he became hypotensive. On the second HD only 700 mL were removed as this RVR started to race.  Orthostatic hypotension: - On midodrine at home. - This was  held on admission due to hypotension.  Diabetes mellitus with diabetic nephropathy: - We'll control in house continue current therapy.    Code Status: full Code Family Communication: None  Disposition Plan: Unable to determine.   Consultants:  Surgery  Renal  Procedures: ECHO: none  Antibiotics:  vanc and zosyn 4.12.2016  HPI/Subjective: No shortness of breath he relates his pain is  better. Has remained asymptomatic.  Objective: Filed Vitals:   12/11/14 0515 12/11/14 0530 12/11/14 0545 12/11/14 0600  BP: 80/48 91/53 100/52 95/58  Pulse: 46 46 45   Temp:      TempSrc:      Resp: 22 23 20 25   Height:      Weight:      SpO2: 99% 99% 100%      Exam:  General: Alert, awake, oriented x3, in no acute distress.  HEENT: No bruits, no goiter. - JVD Heart: Regular rate and rhythm. Lungs: Good air movement, clear. Abdomen: Soft, nontender, nondistended, positive bowel sounds.  Neuro: Grossly intact, nonfocal.   Data Reviewed: Basic Metabolic Panel:  Recent Labs Lab 12/11/2014 1415 12/09/14 0438 12/11/14 0522  NA 136 138 138  K 3.8 3.6 3.8  CL 95* 102 100  CO2 26 26 27   GLUCOSE 193* 120* 109*  BUN 20 23 17   CREATININE 3.96* 4.49* 4.03*  CALCIUM 7.9* 7.8* 7.9*  PHOS  --   --  2.5   Liver Function Tests:  Recent Labs Lab 12/01/2014 1415 12/09/14 0438  AST 57* 43*  ALT 28 23  ALKPHOS 155* 129*  BILITOT 4.5* 5.0*  PROT 7.9 6.7  ALBUMIN 2.3* 2.0*   No results for input(s): LIPASE, AMYLASE in the last 168 hours. No results for input(s): AMMONIA in the last 168 hours. CBC:  Recent Labs Lab 12/13/2014 1415 12/09/14 0438 12/10/14 0454 12/11/14 0522  WBC 17.6* 13.8* 18.3* 18.3*  NEUTROABS 15.7*  --   --   --   HGB 9.7* 9.3* 9.0* 8.6*  HCT 31.0* 29.4* 28.0* 26.8*  MCV 88.8 88.3 87.2 87.0  PLT 333 219 246 233   Cardiac Enzymes: No results for input(s): CKTOTAL, CKMB, CKMBINDEX, TROPONINI in the last 168 hours. BNP (last 3 results) No results for input(s): BNP in the last 8760 hours.  ProBNP (last 3 results)  Recent Labs  08/15/14 0245  PROBNP >70000.0*    CBG:  Recent Labs Lab 12/09/14 2129 12/10/14 0807 12/10/14 1147 12/10/14 1637 12/10/14 2132  GLUCAP 115* 80 144* 96 125*    Recent Results (from the past 240 hour(s))  Blood Culture (routine x 2)     Status: None (Preliminary result)   Collection Time: 12/18/2014  2:48 PM    Result Value Ref Range Status   Specimen Description BLOOD LEFT FOREARM  Final   Special Requests BOTTLES DRAWN AEROBIC ONLY 4CC  Final   Culture NO GROWTH 3 DAYS  Final   Report Status PENDING  Incomplete  Blood Culture (routine x 2)     Status: None (Preliminary result)   Collection Time: 12/06/2014  3:00 PM  Result Value Ref Range Status   Specimen Description BLOOD RIGHT HAND  Final   Special Requests   Final    BOTTLES DRAWN AEROBIC AND ANAEROBIC AEB=6CC ANA=4CC   Culture NO GROWTH 3 DAYS  Final   Report Status PENDING  Incomplete  MRSA PCR Screening     Status: None   Collection Time: 12/14/2014  6:40 PM  Result Value Ref Range Status  MRSA by PCR NEGATIVE NEGATIVE Final    Comment:        The GeneXpert MRSA Assay (FDA approved for NASAL specimens only), is one component of a comprehensive MRSA colonization surveillance program. It is not intended to diagnose MRSA infection nor to guide or monitor treatment for MRSA infections.      Studies: Mr Foot Left Wo Contrast  12/09/2014   CLINICAL DATA:  Disc pain and swelling of the left third toe with a draining wound on the plantar surface.  EXAM: MRI OF THE LEFT FOREFOOT WITHOUT CONTRAST  TECHNIQUE: Multiplanar, multisequence MR imaging was performed. No intravenous contrast was administered.  COMPARISON:  Plain films of the left foot 11/28/2014.  FINDINGS: A large ulceration is seen on the plantar surface of the foot at the level of the second and third MTP joints. No underlying abscess is identified. No bone marrow signal abnormality to suggest osteomyelitis is identified. The ulceration appears to extend almost bone at the third MTP joint. Intrinsic musculature of the foot demonstrates atrophy. Mild subcutaneous edema is seen over the dorsum of the foot.  IMPRESSION: Deep ulceration on the plantar surface of the foot centered at the level of the third MTP joint without underlying abscess or osteomyelitis.   Electronically Signed    By: Inge Rise M.D.   On: 12/09/2014 16:13   [place] Lower Ext Art Left  12/10/2014   CLINICAL DATA:  64 year old male with decreased pulses and right above the knee amputation.  EXAM: UNILATERAL RIGHT LOWER EXTREMITY ARTERIAL DUPLEX SCAN  TECHNIQUE: Gray-scale sonography as well as color Doppler and duplex ultrasound was performed to evaluate the arteries of the lower extremity.  COMPARISON:  Prior CT pelvis 09/09/2014  FINDINGS: Sonographic interrogation of the right lower extremity vessels demonstrates severe ulcerated, irregular and calcified plaque beginning in the common femoral artery and extending to the ankle. Biphasic pulses are present within the common femoral and superficial femoral arteries. There is no evidence of focal stenosis. The deep femoral artery is patent.  The popliteal artery is heavily diseased and likely occluded for a short segment. Minimal flow is present within the anterior tibial and posterior tibial arteries.  IMPRESSION: 1. Heavily diseased popliteal artery with occlusion in the mid segment. 2. Very limited flow within the runoff vessels likely via collateral flow. 3. Irregular and calcified plaque throughout the common femoral and superficial femoral arteries without focal stenosis. 4. The profunda femoral artery remains patent. Signed,  Criselda Peaches, MD  [facility]  [facility]   Electronically Signed   By: Jacqulynn Cadet M.D.   On: 12/10/2014 15:40    Scheduled Meds: . allopurinol  100 mg Oral Daily  . amiodarone  150 mg Intravenous Once  . antiseptic oral rinse  7 mL Mouth Rinse BID  . cinacalcet  30 mg Oral Q breakfast  . epoetin (EPOGEN/PROCRIT) injection  10,000 Units Intravenous Q T,Th,Sa-HD  . FLUoxetine  20 mg Oral Daily  . insulin aspart  0-5 Units Subcutaneous QHS  . insulin aspart  0-9 Units Subcutaneous TID WC  . insulin glargine  10 Units Subcutaneous QHS  . metoprolol tartrate  25 mg Oral BID    . midodrine  10 mg Oral BID WC  . pantoprazole  40 mg Oral BID  . piperacillin-tazobactam (ZOSYN)  IV  2.25 g Intravenous Q8H  . sevelamer carbonate  800 mg Oral TID WC  . sodium chloride  3 mL Intravenous Q12H  .  traZODone  50 mg Oral QHS  . vancomycin  1,000 mg Intravenous Q T,Th,Sa-HD  . Warfarin - Pharmacist Dosing Inpatient   Does not apply Q24H   Continuous Infusions: . sodium chloride 10 mL/hr at 12/11/14 0600  . amiodarone 30 mg/hr (12/11/14 0600)    Time spent with him 35 min   Charlynne Cousins  [facility] Pager [phone] If 7PM-7AM, please contact night-coverage at [email or web address], password [facility] 12/11/2014, 7:31 AM  LOS: 3 days

## 2014-12-11 NOTE — Progress Notes (Signed)
Paged Rogue Bussing again with return call at [phone]; orders received to repeat 264ml bolus; will continue to monitor and document any changes

## 2014-12-11 NOTE — Progress Notes (Signed)
ANTIBIOTIC CONSULT NOTE  Pharmacy Consult for Vancomycin and Zosyn  Indication: cellulitis   Allergies  Allergen Reactions  . Bacitracin Other (See Comments)    unknown  . Norvasc [Amlodipine Besylate] Other (See Comments)    unknown    Patient Measurements: Height: 6\' 3"  (190.5 cm) Weight: 197 lb 5 oz (89.5 kg) IBW/kg (Calculated) : 84.5  Vital Signs: Temp: 98.1 F (36.7 C) (04/15 0400) Temp Source: Oral (04/15 0400) BP: 95/58 mmHg (04/15 0600) Pulse Rate: 45 (04/15 0545) Intake/Output from previous day: 04/14 0701 - 04/15 0700 In: 1734.7 [P.O.:480; I.V.:604.7; IV Piggyback:650] Out: 720  Intake/Output from this shift:    Labs:  Recent Labs  12/07/2014 1415 12/09/14 0438 12/10/14 0454 12/11/14 0522  WBC 17.6* 13.8* 18.3* 18.3*  HGB 9.7* 9.3* 9.0* 8.6*  PLT 333 219 246 233  CREATININE 3.96* 4.49*  --  4.03*   Estimated Creatinine Clearance: 21.8 mL/min (by C-G formula based on Cr of 4.03). No results for input(s): VANCOTROUGH, VANCOPEAK, VANCORANDOM, GENTTROUGH, GENTPEAK, GENTRANDOM, TOBRATROUGH, TOBRAPEAK, TOBRARND, AMIKACINPEAK, AMIKACINTROU, AMIKACIN in the last 72 hours.   Microbiology: Recent Results (from the past 720 hour(s))  Clostridium Difficile by PCR     Status: Abnormal   Collection Time: 11/15/14  6:40 AM  Result Value Ref Range Status   C difficile by pcr POSITIVE (A) NEGATIVE Final    Comment: CRITICAL RESULT CALLED TO, READ BACK BY AND VERIFIED WITH: D.MURPHY,RN 11/15/14 @1158  BY V.WILKINS   Blood Culture (routine x 2)     Status: None (Preliminary result)   Collection Time: 12/07/2014  2:48 PM  Result Value Ref Range Status   Specimen Description BLOOD LEFT FOREARM  Final   Special Requests BOTTLES DRAWN AEROBIC ONLY 4CC  Final   Culture NO GROWTH 3 DAYS  Final   Report Status PENDING  Incomplete  Blood Culture (routine x 2)     Status: None (Preliminary result)   Collection Time: 12/24/2014  3:00 PM  Result Value Ref Range Status   Specimen Description BLOOD RIGHT HAND  Final   Special Requests   Final    BOTTLES DRAWN AEROBIC AND ANAEROBIC AEB=6CC ANA=4CC   Culture NO GROWTH 3 DAYS  Final   Report Status PENDING  Incomplete  MRSA PCR Screening     Status: None   Collection Time: 12/22/2014  6:40 PM  Result Value Ref Range Status   MRSA by PCR NEGATIVE NEGATIVE Final    Comment:        The GeneXpert MRSA Assay (FDA approved for NASAL specimens only), is one component of a comprehensive MRSA colonization surveillance program. It is not intended to diagnose MRSA infection nor to guide or monitor treatment for MRSA infections.     Anti-infectives    Start     Dose/Rate Route Frequency Ordered Stop   12/10/14 1200  vancomycin (VANCOCIN) IVPB 1000 mg/200 mL premix     1,000 mg 200 mL/hr over 60 Minutes Intravenous Every T-Th-Sa (Hemodialysis) 12/20/2014 1821     12/09/14 1400  piperacillin-tazobactam (ZOSYN) 2.25 g in dextrose 5 % 50 mL IVPB     2.25 g 100 mL/hr over 30 Minutes Intravenous Every 8 hours 12/09/14 0824     12/07/2014 2200  piperacillin-tazobactam (ZOSYN) IVPB 2.25 g  Status:  Discontinued     2.25 g 100 mL/hr over 30 Minutes Intravenous 3 times per day 12/05/2014 1821 12/09/14 0823   12/03/2014 2000  vancomycin (VANCOCIN) IVPB 1000 mg/200 mL premix  1,000 mg 200 mL/hr over 60 Minutes Intravenous  Once 12/13/2014 1821 12/15/2014 2200   12/13/2014 1430  piperacillin-tazobactam (ZOSYN) IVPB 3.375 g     3.375 g 100 mL/hr over 30 Minutes Intravenous  Once 12/22/2014 1428 12/06/2014 1514   12/12/2014 1430  vancomycin (VANCOCIN) IVPB 1000 mg/200 mL premix     1,000 mg 200 mL/hr over 60 Minutes Intravenous  Once 12/17/2014 1428 12/15/2014 1627      Assessment: 64 yo M with hx DM, ESRD, hx R BKA presented with gangrenous left toe.  He continues on empiric, broad-spectrum antibiotics.  MRI negative for osteo or abscess.   His vitals remain unstable, persistent leukocytosis, lactic acid trending back up.   Plan  transfer to [facility] for ortho eval, possible amputation given high surgical risk.   Zosyn 4/12>> Vanc 4/12>>  Goal of Therapy:  Eradicate infection. Pre-Hemodialysis Vancomycin level =15-25 mcg/ml  Plan:  Zosyn 2.25gm IV every 8 hours. Vancomycin 1gm IV every HD (TTS) Measure antibiotic drug levels at steady state Follow up culture results  Biagio Borg 12/11/2014,8:13 AM

## 2014-12-11 NOTE — Progress Notes (Signed)
Pt transferring to [place] at [facility] today. Pt/family is aware and agreeable to transfer. Assessment is unchanged from this morning and receiving RN has been given report. Belongings sent with pt.

## 2014-12-11 NOTE — Progress Notes (Signed)
Cardiology service notified of patient's HR Afib 120-130s sustained. Was told that primary team should address as possible other reasons for uncontrolled heart rate. [name] [name] re-paged.

## 2014-12-11 NOTE — Progress Notes (Addendum)
BP 79/53 mmHg  Pulse 55  Temp(Src) 98.4 F (36.9 C) (Oral)  Resp 24  Ht 6\' 3"  (1.905 m)  Wt 89.5 kg (197 lb 5 oz)  BMI 24.66 kg/m2  SpO2 100% C. Dif PCR was checked in the morning has he had 3 watery stools and it was positive. Start him on Flagyl 500 mm by mouth 3 times a day first dose now.

## 2014-12-11 NOTE — Progress Notes (Signed)
Primary cardiologist: Dr. Satira Sark  Seen for followup: Atrial fibrillation/flutter, cardiomyopathy  Subjective:    Left leg soreness, no chest pain or palpitations, mildly short of breath.  Objective:   Temp:  [97.4 F (36.3 C)-100 F (37.8 C)] 98.1 F (36.7 C) (04/15 0400) Pulse Rate:  [25-135] 45 (04/15 0545) Resp:  [11-31] 25 (04/15 0600) BP: (50-119)/(20-97) 95/58 mmHg (04/15 0600) SpO2:  [90 %-100 %] 100 % (04/15 0545) FiO2 (%):  [28 %] 28 % (04/14 1430) Weight:  [196 lb 3.4 oz (89 kg)-197 lb 5 oz (89.5 kg)] 197 lb 5 oz (89.5 kg) (04/15 0500) Last BM Date: 12/10/14  Filed Weights   12/10/14 0500 12/10/14 1215 12/11/14 0500  Weight: 195 lb 15.8 oz (88.9 kg) 196 lb 3.4 oz (89 kg) 197 lb 5 oz (89.5 kg)    Intake/Output Summary (Last 24 hours) at 12/11/14 0836 Last data filed at 12/11/14 0600  Gross per 24 hour  Intake 1494.72 ml  Output    720 ml  Net 774.72 ml    Telemetry: Atrial fibrillation heart rate in the 90 to 100 range.  Exam:  General: Chronically ill-appearing, no distress.  Lungs: Decreased breath sounds, no rales.  Cardiac: Irregularly irregular, indistinct PMI.  Abdomen: NABS.  Extremities: Status post right AKA, ulceration and cellulitic changes left foot.  Lab Results:  Basic Metabolic Panel:  Recent Labs Lab 12/11/2014 1415 12/09/14 0438 12/11/14 0522  NA 136 138 138  K 3.8 3.6 3.8  CL 95* 102 100  CO2 26 26 27   GLUCOSE 193* 120* 109*  BUN 20 23 17   CREATININE 3.96* 4.49* 4.03*  CALCIUM 7.9* 7.8* 7.9*    Liver Function Tests:  Recent Labs Lab 12/05/2014 1415 12/09/14 0438  AST 57* 43*  ALT 28 23  ALKPHOS 155* 129*  BILITOT 4.5* 5.0*  PROT 7.9 6.7  ALBUMIN 2.3* 2.0*    CBC:  Recent Labs Lab 12/09/14 0438 12/10/14 0454 12/11/14 0522  WBC 13.8* 18.3* 18.3*  HGB 9.3* 9.0* 8.6*  HCT 29.4* 28.0* 26.8*  MCV 88.3 87.2 87.0  PLT 219 246 233    Coagulation:  Recent Labs Lab 12/09/14 0438  12/10/14 0454 12/11/14 0522  INR 1.74* 2.05* 2.72*    Echocardiogram (TEE) 08/19/2014: Study Conclusions  - Left ventricle: Systolic function was severely reduced. The estimated ejection fraction was 15%. Diffuse hypokinesis. - Aortic valve: Cusp separation was reduced. There was mild stenosis. - Mitral valve: A bioprosthesis was present. There was mild regurgitation. - Left atrium: The atrium was severely dilated. No evidence of thrombus in the atrial cavity; LAA ligated. There was moderatecontinuous spontaneous echo contrast (&quot;smoke&quot;) in the cavity. - Right ventricle: The cavity size was mildly dilated. Systolic function was severely reduced. - Right atrium: The atrium was mildly dilated. - Atrial septum: No defect or patent foramen ovale was identified. - Tricuspid valve: No evidence of vegetation.  Impressions:  - Severe global reduction in LV function; 4 chamber enlargement; left atrium with spontaneous contrast but no thrombus; LAA ligated; severe RV dysfunction; bioprosthetic MV with mild MR; calcifed aortic valve with mild AS (AVA 1.6 by planimetry; mean gradient 10 mmHg); mild TR.   Medications:   Scheduled Medications: . allopurinol  100 mg Oral Daily  . amiodarone  150 mg Intravenous Once  . antiseptic oral rinse  7 mL Mouth Rinse BID  . cinacalcet  30 mg Oral Q breakfast  . epoetin (EPOGEN/PROCRIT) injection  10,000 Units Intravenous Q T,Th,Sa-HD  .  FLUoxetine  20 mg Oral Daily  . insulin aspart  0-5 Units Subcutaneous QHS  . insulin aspart  0-9 Units Subcutaneous TID WC  . insulin glargine  10 Units Subcutaneous QHS  . metoprolol tartrate  25 mg Oral BID  . midodrine  10 mg Oral BID WC  . pantoprazole  40 mg Oral BID  . piperacillin-tazobactam (ZOSYN)  IV  2.25 g Intravenous Q8H  . sevelamer carbonate  800 mg Oral TID WC  . sodium chloride  3 mL Intravenous Q12H  . traZODone  50 mg Oral QHS  . vancomycin  1,000 mg  Intravenous Q T,Th,Sa-HD     Infusions: . sodium chloride 10 mL/hr at 12/11/14 0600  . amiodarone 30 mg/hr (12/11/14 0600)     PRN Medications:  sodium chloride, sodium chloride, albuterol, feeding supplement (NEPRO CARB STEADY), heparin, HYDROcodone-acetaminophen, lidocaine (PF), lidocaine-prilocaine, ondansetron **OR** ondansetron (ZOFRAN) IV, pentafluoroprop-tetrafluoroeth   Assessment:   1. Persistent atrial fibrillation/flutter with RVR, now placed on IV amiodarone with better heart rate control, continues on beta blocker and Coumadin otherwise. Last cardioversion was in December 2015. It looks like he went back into atrial fibrillation sometime in March. Known history of PAF, on oral amiodarone at home.  2. Cardiomyopathy, LVEF approximately 15% as of December 2015 at [facility]. Have not pushed for ICD in light of comorbidities and increase risk of infection.  3. History of bioprosthetic mitral valve replacement with pericardial valve for severe MR along with CABG x4, left atrial appendage closure and right-sided Maze procedure May of 2008.  4. Left lower extremity cellulitis with gangrene and ulceration, no history of PAD with previous right AKA. On broad-spectrum antibiotic coverage. Extensive PAD by follow-up ABI, amputation being considered by surgery. He is pending transfer to the internal medicine service at [facility]. Dr. Ninfa Linden to assess surgically.  5. End-stage renal disease on hemodialysis.  6. History of orthostatic hypotension, on Midrin at home.  7. Type 2 diabetes mellitus with nephropathy.   Plan/Discussion:    Patient being transferred to [facility] on the internal medicine service due to medical complexity and high risk for surgery. Cardiology will continue to follow. For now would maintain IV amiodarone and not plan on electrical cardioversion until postoperatively if needed. Currently therapeutic on Coumadin with INR 2.7.   Satira Sark,  M.D., F.A.C.C.

## 2014-12-11 NOTE — Progress Notes (Signed)
Jonette Eva, NP notified of patient's HR Afib 120-130s sustained with BP 87/57 on Amiodarone gtt infusing. Discussed to notify cardiology for recommendations. Cardiology paged.

## 2014-12-11 NOTE — Progress Notes (Signed)
[facility] paramedics were concerned about pt's BP and MAP. They called receiving physician (Dr. Dorothy Puffer) and in correspondence with Dr. Aileen Fass it was decided that pt should be given a one time bolus of 575mL to support his BP. 542mL bolus has been started.

## 2014-12-11 NOTE — Progress Notes (Signed)
Events over the last 24 hours noted. Cardiology consult noted. Patient being transferred to [facility] for further management treatment. I did review the arterial segmental Doppler study done yesterday. Will sign off.

## 2014-12-11 NOTE — Progress Notes (Signed)
Mid-level paged for low blood pressure; informed that patient had dialysis today and had 756mls removed and was also started on Amiodarone drip; call back was at 2137 with order received for 228ml bolus of NS

## 2014-12-11 NOTE — Progress Notes (Signed)
Pt's BP is in the high 70s-80s/60s. Pt denies chest pain, SOB, dyspnea, lightheadedness, or nausea. Pt is alert and oriented x 4. HR is 115. MD made aware and there are no new orders at this time. Lab called to alert nursing staff that pt is C.Diff positive. MD made aware and appropriate antibiotic will be ordered. Receiving nurse at [facility] has been updated.

## 2014-12-11 NOTE — Progress Notes (Signed)
Subjective: Interval History: has no complaint of nausea or vomiting. His leg pain is better. Presently denies any difficulty breathing..  Objective: Vital signs in last 24 hours: Temp:  [97.4 F (36.3 C)-100 F (37.8 C)] 98.1 F (36.7 C) (04/15 0400) Pulse Rate:  [25-135] 45 (04/15 0545) Resp:  [11-31] 25 (04/15 0600) BP: (50-119)/(20-97) 95/58 mmHg (04/15 0600) SpO2:  [90 %-100 %] 100 % (04/15 0545) FiO2 (%):  [28 %] 28 % (04/14 1430) Weight:  [89 kg (196 lb 3.4 oz)-89.5 kg (197 lb 5 oz)] 89.5 kg (197 lb 5 oz) (04/15 0500) Weight change: 0.1 kg (3.5 oz)  Intake/Output from previous day: 04/14 0701 - 04/15 0700 In: 1734.7 [P.O.:480; I.V.:604.7; IV Piggyback:650] Out: 720  Intake/Output this shift:    General appearance: alert, cooperative and no distress Resp: clear to auscultation bilaterally Cardio: irregularly irregular rhythm GI: soft, non-tender; bowel sounds normal; no masses,  no organomegaly Extremities: Cellulitis and gangrene of the circumflex left toe  Lab Results:  Recent Labs  12/10/14 0454 12/11/14 0522  WBC 18.3* 18.3*  HGB 9.0* 8.6*  HCT 28.0* 26.8*  PLT 246 233   BMET:  Recent Labs  12/09/14 0438 12/11/14 0522  NA 138 138  K 3.6 3.8  CL 102 100  CO2 26 27  GLUCOSE 120* 109*  BUN 23 17  CREATININE 4.49* 4.03*  CALCIUM 7.8* 7.9*   No results for input(s): PTH in the last 72 hours. Iron Studies: No results for input(s): IRON, TIBC, TRANSFERRIN, FERRITIN in the last 72 hours.  Studies/Results: Mr Foot Left Wo Contrast  12/09/2014   CLINICAL DATA:  Disc pain and swelling of the left third toe with a draining wound on the plantar surface.  EXAM: MRI OF THE LEFT FOREFOOT WITHOUT CONTRAST  TECHNIQUE: Multiplanar, multisequence MR imaging was performed. No intravenous contrast was administered.  COMPARISON:  Plain films of the left foot 12/17/2014.  FINDINGS: A large ulceration is seen on the plantar surface of the foot at the level of the second  and third MTP joints. No underlying abscess is identified. No bone marrow signal abnormality to suggest osteomyelitis is identified. The ulceration appears to extend almost bone at the third MTP joint. Intrinsic musculature of the foot demonstrates atrophy. Mild subcutaneous edema is seen over the dorsum of the foot.  IMPRESSION: Deep ulceration on the plantar surface of the foot centered at the level of the third MTP joint without underlying abscess or osteomyelitis.   Electronically Signed   By: Inge Rise M.D.   On: 12/09/2014 16:13   [place] Lower Ext Art Left  12/10/2014   CLINICAL DATA:  64 year old male with decreased pulses and right above the knee amputation.  EXAM: UNILATERAL RIGHT LOWER EXTREMITY ARTERIAL DUPLEX SCAN  TECHNIQUE: Gray-scale sonography as well as color Doppler and duplex ultrasound was performed to evaluate the arteries of the lower extremity.  COMPARISON:  Prior CT pelvis 09/09/2014  FINDINGS: Sonographic interrogation of the right lower extremity vessels demonstrates severe ulcerated, irregular and calcified plaque beginning in the common femoral artery and extending to the ankle. Biphasic pulses are present within the common femoral and superficial femoral arteries. There is no evidence of focal stenosis. The deep femoral artery is patent.  The popliteal artery is heavily diseased and likely occluded for a short segment. Minimal flow is present within the anterior tibial and posterior tibial arteries.  IMPRESSION: 1. Heavily diseased popliteal artery with occlusion in the mid segment. 2. Very limited flow within the  runoff vessels likely via collateral flow. 3. Irregular and calcified plaque throughout the common femoral and superficial femoral arteries without focal stenosis. 4. The profunda femoral artery remains patent. Signed,  Criselda Peaches, MD  [facility]  [facility]   Electronically Signed   By: Jacqulynn Cadet M.D.    On: 12/10/2014 15:40    I have reviewed the patient's current medications.  Assessment/Plan: Problem #1 end-stage renal disease: He is status post hemodialysis yesterday. Presently patient is asymptomatic. Problem #2 cellulitis/gangrene officer to. Presently ABI was done which show significant popliteal artery disease. Patient is being transferred to [facility] for further management. Problem #3  atrial fibrillation: His heart rate is controlled. Presently being followed by cardiology  Problem #4 anemia: Hemoglobin and hematocrit is below our target goal. Patient is on Epogen. Problem #5 diabetes Problem #6 hypotension: Mostly intradialytic. Patient on MetroGel as an outpatient. Problem #7 history of mitral valve replacement Problem #8 metabolic bone disease: Both his calcium and phosphorus is low. Patient presently on Sensipar and [place]. Plan: We'll DC Sensipar and Renvela. Patient does not need dialysis today and may require in the morning which is his regular schedule.   LOS: 3 days   Marcene Laskowski S 12/11/2014,9:10 AM

## 2014-12-11 NOTE — Progress Notes (Signed)
Mid level [place] paged after bolus complete; blood pressure still low with SBp in the 70's and 80's. Paged again at 2250, and again at 2300, no answer at this time, Hospitalist R Shanon Brow paged with return call

## 2014-12-11 NOTE — Progress Notes (Addendum)
Pt had 6 beat run of vtach. MD is aware, pt asymptomatic and now back in a-fib with rate control on IV amiodarone. No new orders at this time. Will continue to monitor.

## 2014-12-12 LAB — HEPATITIS B SURFACE ANTIGEN: Hepatitis B Surface Ag: NEGATIVE

## 2014-12-12 LAB — COMPREHENSIVE METABOLIC PANEL
ALBUMIN: 1.5 g/dL — AB (ref 3.5–5.2)
ALT: 18 U/L (ref 0–53)
AST: 38 U/L — AB (ref 0–37)
Alkaline Phosphatase: 116 U/L (ref 39–117)
Anion gap: 15 (ref 5–15)
BUN: 21 mg/dL (ref 6–23)
CALCIUM: 8.2 mg/dL — AB (ref 8.4–10.5)
CO2: 24 mmol/L (ref 19–32)
Chloride: 99 mmol/L (ref 96–112)
Creatinine, Ser: 5.16 mg/dL — ABNORMAL HIGH (ref 0.50–1.35)
GFR, EST AFRICAN AMERICAN: 12 mL/min — AB (ref >90)
GFR, EST NON AFRICAN AMERICAN: 11 mL/min — AB (ref >90)
Glucose, Bld: 91 mg/dL (ref 70–99)
Potassium: 3.9 mmol/L (ref 3.5–5.1)
SODIUM: 138 mmol/L (ref 135–145)
Total Bilirubin: 5 mg/dL — ABNORMAL HIGH (ref 0.3–1.2)
Total Protein: 6.3 g/dL (ref 6.0–8.3)

## 2014-12-12 LAB — CBC
HCT: 26.3 % — ABNORMAL LOW (ref 39.0–52.0)
Hemoglobin: 8.9 g/dL — ABNORMAL LOW (ref 13.0–17.0)
MCH: 27.6 pg (ref 26.0–34.0)
MCHC: 33.8 g/dL (ref 30.0–36.0)
MCV: 81.4 fL (ref 78.0–100.0)
Platelets: 254 10*3/uL (ref 150–400)
RBC: 3.23 MIL/uL — ABNORMAL LOW (ref 4.22–5.81)
RDW: 17.1 % — ABNORMAL HIGH (ref 11.5–15.5)
WBC: 21.7 10*3/uL — ABNORMAL HIGH (ref 4.0–10.5)

## 2014-12-12 LAB — PROTIME-INR
INR: 3.71 — AB (ref 0.00–1.49)
Prothrombin Time: 37 seconds — ABNORMAL HIGH (ref 11.6–15.2)

## 2014-12-12 LAB — GLUCOSE, CAPILLARY
GLUCOSE-CAPILLARY: 91 mg/dL (ref 70–99)
GLUCOSE-CAPILLARY: 95 mg/dL (ref 70–99)
Glucose-Capillary: 103 mg/dL — ABNORMAL HIGH (ref 70–99)
Glucose-Capillary: 111 mg/dL — ABNORMAL HIGH (ref 70–99)

## 2014-12-12 MED ORDER — MIDODRINE HCL 5 MG PO TABS
ORAL_TABLET | ORAL | Status: AC
  Filled 2014-12-12: qty 2

## 2014-12-12 MED ORDER — FAMOTIDINE 20 MG PO TABS
20.0000 mg | ORAL_TABLET | Freq: Every day | ORAL | Status: DC
  Administered 2014-12-13 – 2014-12-22 (×10): 20 mg via ORAL
  Filled 2014-12-12 (×11): qty 1

## 2014-12-12 MED ORDER — SODIUM CHLORIDE 0.9 % IV BOLUS (SEPSIS)
500.0000 mL | Freq: Once | INTRAVENOUS | Status: AC
  Administered 2014-12-12: 500 mL via INTRAVENOUS

## 2014-12-12 MED ORDER — SODIUM CHLORIDE 0.9 % IV BOLUS (SEPSIS)
1000.0000 mL | Freq: Once | INTRAVENOUS | Status: AC
  Administered 2014-12-12: 1000 mL via INTRAVENOUS

## 2014-12-12 MED ORDER — ACETAMINOPHEN 325 MG PO TABS: 650.0000 mg | ORAL_TABLET | Freq: Four times a day (QID) | ORAL | Status: DC | PRN

## 2014-12-12 MED ORDER — DARBEPOETIN ALFA 100 MCG/0.5ML IJ SOSY
100.0000 ug | PREFILLED_SYRINGE | INTRAMUSCULAR | Status: DC
  Administered 2014-12-12 – 2014-12-19 (×2): 100 ug via INTRAVENOUS
  Filled 2014-12-12: qty 0.5

## 2014-12-12 MED ORDER — DARBEPOETIN ALFA 100 MCG/0.5ML IJ SOSY
PREFILLED_SYRINGE | INTRAMUSCULAR | Status: AC
  Filled 2014-12-12: qty 0.5

## 2014-12-12 MED ORDER — DARBEPOETIN ALFA 100 MCG/0.5ML IJ SOSY: 100.0000 ug | PREFILLED_SYRINGE | INTRAMUSCULAR | Status: DC

## 2014-12-12 NOTE — Progress Notes (Signed)
PT HR sustaning 130's already on amio BP 81/36 Paged Jonette Eva who ordered 500 cc bolus will continue to monitor.

## 2014-12-12 NOTE — Progress Notes (Signed)
BP 75/48, notified M. Donnal Debar, NP. Stated that she discussed with CCM and to continue giving IV bolus. New orders for NS 500cc bolus and to decrease amiodarone back to 30mg /hr. New orders completed. Will continue to monitor.

## 2014-12-12 NOTE — Progress Notes (Addendum)
[place] TEAM 1 - Stepdown/ICU TEAM Progress Note  [name] [name] LNL:[id number] DOB: 07-12-1951 DOA: 12/09/2014 PCP: [name], MD  Admit HPI / Brief Narrative: 64 year old male with history of CAD status post CABG 2008 with mitral valve replacement (bioprostetic), diabetes mellitus, atrial fibrillation on Coumadin and amiodarone status post cardioversion in 2015, ESRD, chronic systolic heart failure with an EF of 15% in 2015, right AKA, and a right chronic loculated pleural effusion who came to [facility] for left foot pain and was found to have cellulitis and gangrene with severe sepsis (lactic acid of 6).   MRI showed deep ulceration but no evidence of osteo.  ABIs showed a heavily diseased popliteal artery with occlusion in the mid segmentand very limited flow within the runoff vessels.  On 04/14 his RVR became hard to control andhe was started on IV amiodarone and Cardiology was consulted.  04/14 after after dialysis he became hypotensive and had to be given multiple boluses of normal saline. Cardiology and IM deemed him high risk of surgery and he was therefore transferred to [facility].  HPI/Subjective: The patient is alert and conversant and denies new complaints today.  He is anxious to have his infected foot/toe taking care of.  He currently denies chest pain nausea vomiting abdominal pain or shortness of breath.  Assessment/Plan:  Left lower extremity cellulitis with gangrene and ulceration / necrotic 3rd toe Orthopedics is following with [place] - if patient tolerates hemodialysis today without severe hypotension or marked tachycardia it would then be reasonable to take him to the OR on 4/17 - will recheck INR in a.m. but given the extent of vascular disease and the peripheral nature of the planned procedure full reversal of warfarin may not be required prior to surgery (but will defer to Orthopedics) - if reversal is indicated will dose with FFP in morning  Persistent  atrial fibrillation/flutter with RVR Remains on amiodarone drip - heart rate currently reasonably controlled - RVR likely being driven by hypovolemia and sepsis physiology - chronic Coumadin currently being held for planned procedure but INR currently rising without dosing  End-stage renal disease on hemodialysis Nephrology following and to attend to hemodialysis today  Cardiomyopathy / severe chronic systolic congestive heart failure EF 15% December 2015 TEE Care will be exercised with volume administration - follow daily weights and strict Is - not a candidate for ACE inhibitor due to baseline hypertension  CAD Multivessel status post CABG x4 2008 Cardiology following - not a candidate for beta blockers or nitrates due to baseline hypotension  History of bioprosthetic mitral valve replacement 2008 with pericardial valve for severe MR  Does not require anticoagulation as is a tissue valve therefore okay to hold Coumadin for surgery  C. difficile colitis Continue oral vancomycin as per C. difficile treatment protocol - no evidence at present of acute complications  Type 2 diabetes mellitus with nephropathy CBGs currently well controlled  Code Status: FULL Family Communication: Discussed plan of care with patient and his wife at bedside Disposition Plan: SDU  Consultants: Nephrology Cardiology  Orthopedics   Procedures: None  Antibiotics: Zosyn 4/12 > Vanc 4/12 >  DVT prophylaxis: SCDs  Objective: Blood pressure 96/63, pulse 99, temperature 97.9 F (36.6 C), temperature source Oral, resp. rate 16, height 6\' 4"  (1.93 m), weight 92.1 kg (203 lb 0.7 oz), SpO2 100 %.  Intake/Output Summary (Last 24 hours) at 12/12/14 1222 Last data filed at 12/12/14 0606  Gross per 24 hour  Intake 2556.26 ml  Output      0 ml  Net 2556.26 ml   Exam: General: No acute respiratory distress Lungs: Clear to auscultation bilaterally without wheezes or crackles Cardiovascular: Regular rate  without murmur gallop or rub Abdomen: Nontender, nondistended, soft, bowel sounds positive, no rebound, no ascites, no appreciable mass Extremities: Status post right AKA - left foot dressed and dry but foul smell appreciable on exam  Data Reviewed: Basic Metabolic Panel:  Recent Labs Lab 12/10/2014 1415 12/09/14 0438 12/11/14 0522 12/12/14 0250  NA 136 138 138 138  K 3.8 3.6 3.8 3.9  CL 95* 102 100 99  CO2 26 26 27 24   GLUCOSE 193* 120* 109* 91  BUN 20 23 17 21   CREATININE 3.96* 4.49* 4.03* 5.16*  CALCIUM 7.9* 7.8* 7.9* 8.2*  PHOS  --   --  2.5  --     Liver Function Tests:  Recent Labs Lab 12/21/2014 1415 12/09/14 0438 12/12/14 0250  AST 57* 43* 38*  ALT 28 23 18   ALKPHOS 155* 129* 116  BILITOT 4.5* 5.0* 5.0*  PROT 7.9 6.7 6.3  ALBUMIN 2.3* 2.0* 1.5*   Coags:  Recent Labs Lab 11/27/2014 1415 12/09/14 0438 12/10/14 0454 12/11/14 0522  INR 1.68* 1.74* 2.05* 2.72*   CBC:  Recent Labs Lab 12/21/2014 1415 12/09/14 0438 12/10/14 0454 12/11/14 0522  WBC 17.6* 13.8* 18.3* 18.3*  NEUTROABS 15.7*  --   --   --   HGB 9.7* 9.3* 9.0* 8.6*  HCT 31.0* 29.4* 28.0* 26.8*  MCV 88.8 88.3 87.2 87.0  PLT 333 219 246 233   CBG:  Recent Labs Lab 12/11/14 1114 12/11/14 1613 12/11/14 2110 12/11/14 2312 12/12/14 0818  GLUCAP 163* 155* 113* 103* 111*    Recent Results (from the past 240 hour(s))  Blood Culture (routine x 2)     Status: None (Preliminary result)   Collection Time: 12/10/2014  2:48 PM  Result Value Ref Range Status   Specimen Description BLOOD LEFT FOREARM  Final   Special Requests BOTTLES DRAWN AEROBIC ONLY 4CC  Final   Culture NO GROWTH 4 DAYS  Final   Report Status PENDING  Incomplete  Blood Culture (routine x 2)     Status: None (Preliminary result)   Collection Time: 11/30/2014  3:00 PM  Result Value Ref Range Status   Specimen Description BLOOD RIGHT HAND  Final   Special Requests   Final    BOTTLES DRAWN AEROBIC AND ANAEROBIC AEB=6CC ANA=4CC    Culture NO GROWTH 4 DAYS  Final   Report Status PENDING  Incomplete  MRSA PCR Screening     Status: None   Collection Time: 12/17/2014  6:40 PM  Result Value Ref Range Status   MRSA by PCR NEGATIVE NEGATIVE Final    Comment:        The GeneXpert MRSA Assay (FDA approved for NASAL specimens only), is one component of a comprehensive MRSA colonization surveillance program. It is not intended to diagnose MRSA infection nor to guide or monitor treatment for MRSA infections.   Clostridium Difficile by PCR     Status: Abnormal   Collection Time: 12/11/14 10:12 AM  Result Value Ref Range Status   C difficile by pcr POSITIVE (A) NEGATIVE Final    Comment: CRITICAL RESULT CALLED TO, READ BACK BY AND VERIFIED WITH: S.HEATH AT 7616 ON 12/11/14 BY S.VANHOORNE      Studies:   Recent x-ray studies have been reviewed in detail by the Attending Physician  Scheduled Meds:  Scheduled Meds: . allopurinol  100 mg Oral Daily  . amiodarone  150 mg Intravenous Once  . antiseptic oral rinse  7 mL Mouth Rinse BID  . epoetin (EPOGEN/PROCRIT) injection  10,000 Units Intravenous Q T,Th,Sa-HD  . famotidine  20 mg Oral BID  . FLUoxetine  20 mg Oral Daily  . insulin aspart  0-5 Units Subcutaneous QHS  . insulin aspart  0-9 Units Subcutaneous TID WC  . midodrine  10 mg Oral TID WC  . piperacillin-tazobactam (ZOSYN)  IV  2.25 g Intravenous Q8H  . sodium chloride  3 mL Intravenous Q12H  . traZODone  50 mg Oral QHS  . vancomycin  125 mg Oral QID  . vancomycin  1,000 mg Intravenous Q T,Th,Sa-HD    Time spent on care of this patient: 35 mins   Jordyne Poehlman T , MD   [facility] Office  [phone] Pager - Text Page per Shea Evans as per below:  On-Call/Text Page:      [email or web address]      password [email or web address]  If 7PM-7AM, please contact night-coverage [email or web address] Password [email or web address] 12/12/2014, 12:22 PM   LOS: 4 days

## 2014-12-12 NOTE — Progress Notes (Signed)
Jonette Eva, NP notified of patient's sustained HR 120s, BP 84/59, temp 99.0 after 500cc NS bolus. New orders received to increase Amiodarone drip to 60mg /hr for at least 2 hours and then re-evaluate. Will continue to monitor.

## 2014-12-12 NOTE — Progress Notes (Signed)
1.5 hours after bolus HR still sustaniing 130's BP 85/52 paged Jonette Eva who ordered 1000 cc bolus, will continue to monitor

## 2014-12-12 NOTE — Consult Note (Signed)
[facility] Renal Consultation Note  Indication for Consultation:  Management of ESRD/hemodialysis; anemia, hypertension/volume and secondary hyperparathyroidism  HPI: [name] [name] is a 64 y.o. male with ESRD presumed sec DM ( on chronic HD  X 8 years TTS [facility] /Dr. Hinda Lenis follows in [place]) transferred from [facility] to [facility] yest. Secondary to Complexity of his medical problems,admitted with L  Foot gangrene 12/24/2014 with Hypotension/presummed sec to infection /Sepsis with  [facility] 12/15/2014 no growth /  CAD/ CABG/History of bioprosthetic mitral valve replacement with pericardial valve for severe MR /  A. Fib with RVR with known ho CM ef 15%( followed by Card  Dr.Samuel Delman Kitten)  He had HD last on Thursday 12/10/14  at [place] developed  tachycardia on IV amiodarone and transferred to [facility]/ now improved, with Card following closely on IV amiodarone.  Noted his blood pressure will not tolerate beta blockade / calcium blocker/and not a candidate for ICD . His Coumadin is on hold for possible surgery.          Currently some mild Left foot discomfort. Denies sob, chest pain ,or Abd pain.Discussed with him need to increase time 3.5 to 4 hours to prevent BP dropping on HD with his significant CM and low EF.     Past Medical History  Diagnosis Date  . UGI bleed 02/12/11    On anticoagulation; EGD w/snare polypectomy-multiple polypoid lesions antrum(benign), Chronic active gastritis (NEGATIVE H pylori)  . Anemia, chronic disease   . Sepsis due to enterococcus 02/11/11  . Paroxysmal atrial fibrillation     On coumadin  . Coronary atherosclerosis of native coronary artery     a. Multivessel status post CABG 2008.  . Cardiomyopathy   . Type 2 diabetes mellitus   . Essential hypertension, benign   . Hyperlipemia   . Gout   . S/P patent foramen ovale closure   . Hyperbilirubinemia 08/10/2013  . SBO (small bowel obstruction) 01/2014  . Pneumatosis coli 01/2014     Cecum  . Clostridium difficile colitis 01/2014  . Diverticulosis   . Internal hemorrhoids   . ESRD on hemodialysis     Started 2008-2009. Dr Hinda Lenis on a TTS schedule - R forearm AVF for several years    . Orthostatic hypotension   . S/P mitral valve replacement with bioprosthetic valve     a. 2008 at time of CABG.  . Chronic systolic CHF (congestive heart failure)     a. Last EF 15% in 07/2014.  Marland Kitchen Loculated pleural effusion     a. chronic right loculated pleural effusion s/p chest tube 10/2014.  Marland Kitchen RBBB     a. Intermittent.     Past Surgical History  Procedure Laterality Date  . Mitral valve replacement  01/05/2007    Bioprosthetic 92m Edwards precordial tissue (Dr. GServando Snare  . Coronary artery bypass graft  01/05/2007    LIMA-LAD, SVG-OM, seq. SVG-PDA, PLA-RCA  . Colonoscopy  06/23/2011    Dr. FOneida Alar multiple sessile and pedunculated polyps, moderate diverticulosis, internal hemorrhoids, +ADENOMATOUS POLYPS, consider genetic testing, surveillance in October 2015   . Esophagogastroduodenoscopy  02/12/2011    CHRONIC ACTIVE GASTRITIS, NO H. pylori  . Transthoracic echocardiogram  11/2011    EF 25% w/ mod dilatation of LV & mod LVH; LA severely dilated; mod-severe dilation of RV with mod-severe decrease in RV function; mild MR & TR  . Cardioversion  04/19/2007    Dr. RMarella Chimes . Transthoracic echocardiogram  02/14/2011    EF 40-45%, mod conc LVH; ventricular septum with motion showing abnormal function, dyssynergy, paradox; mild AS; mild-mod MR stenosis; LA severely dilated; aptrial septum bowed from L to R with increased LA pressure   . Amputation Right 07/14/2013    Procedure: AMPUTATION DIGIT;  Surgeon: [name], MD;  Location: [facility];  Service: General;  Laterality: Right;  . Amputation Right 08/08/2013    Procedure: AMPUTATION BELOW KNEE;  Surgeon: [name], MD;  Location: [facility];  Service: General;  Laterality: Right;  . Stump revision Right 09/19/2013     Procedure: STUMP REVISION;  Surgeon: Scherry Ran, MD;  Location: [facility];  Service: General;  Laterality: Right;  . Amputation Right 10/17/2013    Procedure: AMPUTATION ABOVE KNEE;  Surgeon: [name], MD;  Location: [facility];  Service: General;  Laterality: Right;  . Colonoscopy N/A 04/10/2014    Procedure: COLONOSCOPY;  Surgeon: [name], MD;  Location: [facility] SU[facility];  Service: Endoscopy;  Laterality: N/A;  830  . Tee without cardioversion N/A 08/19/2014    Procedure: TRANSESOPHAGEAL ECHOCARDIOGRAM (TEE);  Surgeon: [name], MD;  Location: [facility] ENDOSCOPY;  Service: Cardiovascular;  Laterality: N/A;  . Cardioversion N/A 08/19/2014    Procedure: CARDIOVERSION;  Surgeon: [name], MD;  Location: [facility] ENDOSCOPY;  Service: Cardiovascular;  Laterality: N/A;      Family History  Problem Relation Age of Onset  . Diabetes Mother   . Diabetes Father   . Diabetes Sister   . Colon cancer Neg Hx   . Colon polyps Neg Hx   Social ,Lives in [place]  With wife and     reports that he quit smoking about 8 years ago. His smoking use included Cigarettes. He started smoking about 45 years ago. He has a 8 pack-year smoking history. He has never used smokeless tobacco. He reports that he does not drink alcohol or use illicit drugs.   Allergies  Allergen Reactions  . Bacitracin Other (See Comments)    unknown  . Norvasc [Amlodipine Besylate] Other (See Comments)    unknown    Prior to Admission medications   Medication Sig Start Date End Date Taking? Authorizing Provider  albuterol (PROVENTIL) (2.5 MG/3ML) 0.083% nebulizer solution Take 3 mLs (2.5 mg total) by nebulization every 2 (two) hours as needed for wheezing. 02/21/14  Yes Kinnie Feil, MD  allopurinol (ZYLOPRIM) 100 MG tablet Take 100 mg by mouth daily.  05/15/11  Yes Historical Provider, MD  amiodarone (PACERONE) 200 MG tablet Take 1 tablet (200 mg total) by mouth daily. 08/31/14  Yes Satira Sark, MD  B  Complex-C-Zn-Folic Acid (DIALYVITE/ZINC PO) Take by mouth 3 (three) times a week. Patient has dialysis on Tuesdays, Thursdays, and Saturdays   Yes Historical Provider, MD  FLUoxetine (PROZAC) 20 MG capsule Take 20 mg by mouth daily. 09/21/14  Yes Historical Provider, MD  HYDROcodone-acetaminophen (NORCO/VICODIN) 5-325 MG per tablet Take 1 tablet by mouth every 6 (six) hours as needed for moderate pain. 08/06/14  Yes Carole Civil, MD  insulin glargine (LANTUS) 100 UNIT/ML injection Inject 10 Units into the skin at bedtime.   Yes Historical Provider, MD  metoprolol tartrate (LOPRESSOR) 25 MG tablet Take 0.5 tablets (12.5 mg total) by mouth 2 (two) times daily. 11/19/14  Yes Maryann Mikhail, DO  midodrine (PROAMATINE) 10 MG tablet Take 1 tablet (10 mg total) by mouth 2 (two) times daily with a meal. 08/20/14  Yes Dawood  S Elgergawy, MD  pantoprazole (PROTONIX) 40 MG tablet Take 40 mg by mouth 2 (two) times daily.  05/25/11  Yes Historical Provider, MD  RENVELA 800 MG tablet Take 800 mg by mouth 3 (three) times daily with meals.  05/03/11  Yes Historical Provider, MD  SENSIPAR 30 MG tablet Take 30 mg by mouth daily. 01/22/14  Yes Historical Provider, MD  traMADol (ULTRAM) 50 MG tablet Take by mouth every 8 (eight) hours as needed for moderate pain.   Yes Historical Provider, MD  traZODone (DESYREL) 50 MG tablet Take 1 tablet (50 mg total) by mouth at bedtime. 08/13/13  Yes Nita Sells, MD  warfarin (COUMADIN) 1 MG tablet Take 0.5 mg by mouth See admin instructions. Take 1/2 tablet daily (0.18m) daily except nothing on Sunday and Wednesday.   Yes Historical Provider, MD  Nutritional Supplements (FEEDING SUPPLEMENT, NEPRO CARB STEADY,) LIQD Take 237 mLs by mouth as needed (missed meal during dialysis.). 11/19/14   Maryann Mikhail, DO     Anti-infectives    Start     Dose/Rate Route Frequency Ordered Stop   12/11/14 2300  vancomycin (VANCOCIN) 50 mg/mL oral solution 125 mg     125 mg Oral 4 times  daily 12/11/14 2106 12/25/14 1759   12/11/14 1900  vancomycin (VANCOCIN) 50 mg/mL oral solution 125 mg  Status:  Discontinued     125 mg Oral 4 times daily 12/11/14 1840 12/11/14 2106   12/11/14 1600  metroNIDAZOLE (FLAGYL) tablet 500 mg  Status:  Discontinued     500 mg Oral 3 times per day 12/11/14 1556 12/11/14 1840   12/10/14 1200  vancomycin (VANCOCIN) IVPB 1000 mg/200 mL premix     1,000 mg 200 mL/hr over 60 Minutes Intravenous Every T-Th-Sa (Hemodialysis) 12/16/2014 1821     12/09/14 1400  piperacillin-tazobactam (ZOSYN) 2.25 g in dextrose 5 % 50 mL IVPB     2.25 g 100 mL/hr over 30 Minutes Intravenous Every 8 hours 12/09/14 0824     04 /12/16 2200  piperacillin-tazobactam (ZOSYN) IVPB 2.25 g  Status:  Discontinued     2.25 g 100 mL/hr over 30 Minutes Intravenous 3 times per day 11/29/2014 1821 12/09/14 0823   12/01/2014 2000  vancomycin (VANCOCIN) IVPB 1000 mg/200 mL premix     1,000 mg 200 mL/hr over 60 Minutes Intravenous  Once 12/10/2014 1821 12/26/2014 2200   12/19/2014 1430  piperacillin-tazobactam (ZOSYN) IVPB 3.375 g     3.375 g 100 mL/hr over 30 Minutes Intravenous  Once 12/07/2014 1428 12/10/2014 1514   12/10/2014 1430  vancomycin (VANCOCIN) IVPB 1000 mg/200 mL premix     1,000 mg 200 mL/hr over 60 Minutes Intravenous  Once 12/12/2014 1428 12/25/2014 1627      Results for orders placed or performed during the hospital encounter of 12/02/2014 (from the past 48 hour(s))  Glucose, capillary     Status: Abnormal   Collection Time: 12/10/14 11:47 AM  Result Value Ref Range   Glucose-Capillary 144 (H) 70 - 99 mg/dL   Comment 1 Notify RN    Comment 2 Document in Chart   Hepatitis B surface antigen     Status: None   Collection Time: 12/10/14 12:57 PM  Result Value Ref Range   Hepatitis B Surface Ag NEGATIVE NEGATIVE    Comment: Performed at [facility] Hepatitis B surface antibody     Status: None   Collection Time: 12/10/14 12:57 PM  Result Value Ref Range   Hep B S Ab Reactive  Comment: (NOTE)              Non Reactive: Inconsistent with immunity,                            less than 10 mIU/mL              Reactive:     Consistent with immunity,                            greater than 9.9 mIU/mL Performed At: [facility] [place], [place] [id number] Lindon Romp MD JS:[id number]   Glucose, capillary     Status: None   Collection Time: 12/10/14  4:37 PM  Result Value Ref Range   Glucose-Capillary 96 70 - 99 mg/dL   Comment 1 Notify RN    Comment 2 Document in Chart   Glucose, capillary     Status: Abnormal   Collection Time: 12/10/14  9:32 PM  Result Value Ref Range   Glucose-Capillary 125 (H) 70 - 99 mg/dL  Protime-INR     Status: Abnormal   Collection Time: 12/11/14  5:22 AM  Result Value Ref Range   Prothrombin Time 29.1 (H) 11.6 - 15.2 seconds   INR 2.72 (H) 0.00 - 1.49  CBC     Status: Abnormal   Collection Time: 12/11/14  5:22 AM  Result Value Ref Range   WBC 18.3 (H) 4.0 - 10.5 K/uL   RBC 3.08 (L) 4.22 - 5.81 MIL/uL   Hemoglobin 8.6 (L) 13.0 - 17.0 g/dL   HCT 26.8 (L) 39.0 - 52.0 %   MCV 87.0 78.0 - 100.0 fL   MCH 27.9 26.0 - 34.0 pg   MCHC 32.1 30.0 - 36.0 g/dL   RDW 17.0 (H) 11.5 - 15.5 %   Platelets 233 150 - 400 K/uL  Basic metabolic panel     Status: Abnormal   Collection Time: 12/11/14  5:22 AM  Result Value Ref Range   Sodium 138 135 - 145 mmol/L   Potassium 3.8 3.5 - 5.1 mmol/L   Chloride 100 96 - 112 mmol/L   CO2 27 19 - 32 mmol/L   Glucose, Bld 109 (H) 70 - 99 mg/dL   BUN 17 6 - 23 mg/dL   Creatinine, Ser 4.03 (H) 0.50 - 1.35 mg/dL   Calcium 7.9 (L) 8.4 - 10.5 mg/dL   GFR calc non Af Amer 14 (L) >90 mL/min   GFR calc Af Amer 17 (L) >90 mL/min    Comment: (NOTE) The eGFR has been calculated using the CKD EPI equation. This calculation has not been validated in all clinical situations. eGFR's persistently <90 mL/min signify possible Chronic Kidney Disease.    Anion gap 11 5 - 15  Phosphorus      Status: None   Collection Time: 12/11/14  5:22 AM  Result Value Ref Range   Phosphorus 2.5 2.3 - 4.6 mg/dL  Glucose, capillary     Status: None   Collection Time: 12/11/14  8:27 AM  Result Value Ref Range   Glucose-Capillary 99 70 - 99 mg/dL   Comment 1 Notify RN    Comment 2 Document in Chart   Clostridium Difficile by PCR     Status: Abnormal   Collection Time: 12/11/14 10:12 AM  Result Value Ref Range   C difficile by pcr POSITIVE (A) NEGATIVE    Comment:  CRITICAL RESULT CALLED TO, READ BACK BY AND VERIFIED WITH: S.HEATH AT 1538 ON 12/11/14 BY S.VANHOORNE   Glucose, capillary     Status: Abnormal   Collection Time: 12/11/14 11:14 AM  Result Value Ref Range   Glucose-Capillary 163 (H) 70 - 99 mg/dL   Comment 1 Notify RN    Comment 2 Document in Chart   Glucose, capillary     Status: Abnormal   Collection Time: 12/11/14  4:13 PM  Result Value Ref Range   Glucose-Capillary 155 (H) 70 - 99 mg/dL   Comment 1 Notify RN   Glucose, capillary     Status: Abnormal   Collection Time: 12/11/14  9:10 PM  Result Value Ref Range   Glucose-Capillary 113 (H) 70 - 99 mg/dL   Comment 1 Notify RN   Lactic acid, plasma     Status: None   Collection Time: 12/11/14  9:30 PM  Result Value Ref Range   Lactic Acid, Venous 1.5 0.5 - 2.0 mmol/L  Glucose, capillary     Status: Abnormal   Collection Time: 12/11/14 11:12 PM  Result Value Ref Range   Glucose-Capillary 103 (H) 70 - 99 mg/dL   Comment 1 Notify RN   Comprehensive metabolic panel     Status: Abnormal   Collection Time: 12/12/14  2:50 AM  Result Value Ref Range   Sodium 138 135 - 145 mmol/L   Potassium 3.9 3.5 - 5.1 mmol/L   Chloride 99 96 - 112 mmol/L   CO2 24 19 - 32 mmol/L   Glucose, Bld 91 70 - 99 mg/dL   BUN 21 6 - 23 mg/dL   Creatinine, Ser 5.16 (H) 0.50 - 1.35 mg/dL   Calcium 8.2 (L) 8.4 - 10.5 mg/dL   Total Protein 6.3 6.0 - 8.3 g/dL   Albumin 1.5 (L) 3.5 - 5.2 g/dL   AST 38 (H) 0 - 37 U/L   ALT 18 0 - 53 U/L    Alkaline Phosphatase 116 39 - 117 U/L   Total Bilirubin 5.0 (H) 0.3 - 1.2 mg/dL   GFR calc non Af Amer 11 (L) >90 mL/min   GFR calc Af Amer 12 (L) >90 mL/min    Comment: (NOTE) The eGFR has been calculated using the CKD EPI equation. This calculation has not been validated in all clinical situations. eGFR's persistently <90 mL/min signify possible Chronic Kidney Disease.    Anion gap 15 5 - 15    ROS: See hpi for positives and some reported Diarrhea  several days before admit. Main co of Left foot discomfort only.   Physical Exam: Filed Vitals:   12/12/14 0630  BP: 96/63  Pulse: 99  Temp:   Resp: 16     General: Alert , NAD , appropriate , cooperative HEENT: [place], MMM, Atraumatic Eyes: Nonicteric Neck: supple no jvd Heart: irreg, irreg hrt rate 88, no mur, rub or gallop Lungs: CTA bilat , unlabored breathing  Abdomen: BS pos., soft , NT ,ND Extremities:  R  AKA/ L pedal edema 2-3+ to patella almost/ Skin: L ft bandaged and foul odor, dry gangrenous toes/ no overt rash  Neuro: Ox3 moves all extrem. Dialysis Access: Pos bruit L avf   OP Dialysis Orders: Center: [facility] on TTS . EDW 82.0 kg HD Bath 2.0 K , 2,.5 Ca  Time 3.5 hrs Heparin no . Access LUA AVF BFR 400 DFR 600    Zemplar 0 mcg IV/HD Epogen 11,000   Units IV/HD  Venofer  0   Assessment/Plan 1. L lower extremity Gangrene-  On  IV antibiotic / amputation whn clear by Card  2. A.Fib with RVR- stable Hrt rate now/ card following on Amiodari=ne  BP not able to handle  BB/Coumadin restart after surgery 3. CAD with CABG/ Sig CM with EF 15 %/History of bioprosthetic mitral valve replacement with pericardial valve for severe MR  - increase HD time slightly to help prevent bp drop on hd  4. ESRD -  HD today on Schedule TTS/ needed to increase time to 4 hours sec. To Significant Cm/ CAD 5. Hypertension/volume  - currently 117/85 BP  Attempt 2 l uf  /has some pedal edema with CXR no excess fluid/ on Midodrine  6. Anemia  -  On epo/ use Aranesp 100 q sat in [facility] with  hgb 8.6 7. Metabolic bone disease -  No vit d / Ca 8.2 ( correct 10) use 2.0 bath  Today/ on sensipar and renvela binder 8. Nutrition - Alb 1.5 carb mod renal diet  Needs Protein supplement  9. DM - per admit  Ernest Haber, PA-C [place] [phone] 12/12/2014, 11:17 AM   Renal Attending: I agree with note as articulated above.  Mr Hofmann has ESRD followed in [facility] on HD TTS.  He has necrosis of a toe and will need some type of amputation to be determined.  We will support with dialysis and management of ESRD related matters. Haileigh Pitz C

## 2014-12-12 NOTE — Progress Notes (Addendum)
ANTICOAGULATION CONSULT NOTE - Initial Consult  Pharmacy Consult for Warfarin Indication: atrial fibrillation  Allergies  Allergen Reactions  . Bacitracin Other (See Comments)    unknown  . Norvasc [Amlodipine Besylate] Other (See Comments)    unknown    Patient Measurements: Height: 6\' 4"  (193 cm) Weight: 203 lb 7.8 oz (92.3 kg) IBW/kg (Calculated) : 86.8   Vital Signs: Temp: 97.9 F (36.6 C) (04/17 0833) Temp Source: Oral (04/17 0250) BP: 100/50 mmHg (04/17 0900) Pulse Rate: 141 (04/17 0900)  Labs:  Recent Labs  12/11/14 0522 12/12/14 0250 12/12/14 1253 12/12/14 1401 12/01/2014 0308  HGB 8.6*  --   --  8.9* 9.1*  HCT 26.8*  --   --  26.3* 27.8*  PLT 233  --   --  254 241  LABPROT 29.1*  --  37.0*  --  36.2*  INR 2.72*  --  3.71*  --  3.61*  CREATININE 4.03* 5.16*  --   --  3.35*    Estimated Creatinine Clearance: 27 mL/min (by C-G formula based on Cr of 3.35).   Medical History: Past Medical History  Diagnosis Date  . UGI bleed 02/12/11    On anticoagulation; EGD w/snare polypectomy-multiple polypoid lesions antrum(benign), Chronic active gastritis (NEGATIVE H pylori)  . Anemia, chronic disease   . Sepsis due to enterococcus 02/11/11  . Paroxysmal atrial fibrillation     On coumadin  . Coronary atherosclerosis of native coronary artery     a. Multivessel status post CABG 2008.  . Cardiomyopathy   . Type 2 diabetes mellitus   . Essential hypertension, benign   . Hyperlipemia   . Gout   . S/P patent foramen ovale closure   . Hyperbilirubinemia 08/10/2013  . SBO (small bowel obstruction) 01/2014  . 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 Clostridium difficile colitis 01/2014  . Diverticulosis   . Internal hemorrhoids   . ESRD on hemodialysis     Started 2008-2009. Dr Hinda Lenis on a TTS schedule - R forearm AVF for several years    . Orthostatic hypotension   . S/P mitral valve replacement with bioprosthetic valve     a. 2008 at time of CABG.  . Chronic  systolic CHF (congestive heart failure)     a. Last EF 15% in 07/2014.  Marland Kitchen Loculated pleural effusion     a. chronic right loculated pleural effusion s/p chest tube 10/2014.  Marland Kitchen RBBB     a. Intermittent.     Medications:  Scheduled:  . allopurinol  100 mg Oral Daily  . amiodarone  150 mg Intravenous Once  . antiseptic oral rinse  7 mL Mouth Rinse BID  . darbepoetin (ARANESP) injection - DIALYSIS  100 mcg Intravenous Q Sat-HD  . digoxin  0.25 mg Intravenous Q4H  . famotidine  20 mg Oral Daily  . FLUoxetine  20 mg Oral Daily  . insulin aspart  0-5 Units Subcutaneous QHS  . insulin aspart  0-9 Units Subcutaneous TID WC  . metoprolol tartrate  12.5 mg Oral BID  . midodrine  10 mg Oral TID WC  . piperacillin-tazobactam (ZOSYN)  IV  2.25 g Intravenous Q8H  . sodium chloride  500 mL Intravenous Once  . sodium chloride  3 mL Intravenous Q12H  . traZODone  50 mg Oral QHS  . vancomycin  125 mg Oral QID  . vancomycin  1,000 mg Intravenous Q T,Th,Sa-HD    Assessment: 65 yoM on coumadin PTA for hx Afib. Coumadin  was held since 4/15 pending surgery. S/P surgery on 4/17. Pharmacy consulted to restart coumadin in this patient. INR supratherapeutic at 3.61.  Reviewing previous notes from [facility], it appears his INR fluctuates and is very sensitive to coumadin.  Documented Home Dose: 0.5 mg daily except on Sunday and Wednesday, no coumadin.  Goal of Therapy:  INR 2-3 Monitor platelets by anticoagulation protocol: Yes   Plan:  -Hold warfarin X 1 tonight -Daily PT/INR -Monitor for signs of bleeding  Theron Arista, PharmD Clinical Pharmacist - Resident Pager: [phone] 4/17/201611:47 AM

## 2014-12-12 NOTE — Progress Notes (Signed)
Primary cardiologist: Dr. Satira Sark  Seen for followup: Atrial fibrillation/flutter, cardiomyopathy  Subjective:    Denies dyspnea or chest pain  Objective:   Temp:  [97.3 F (36.3 C)-99.2 F (37.3 C)] 97.3 F (36.3 C) (04/16 0350) Pulse Rate:  [25-138] 99 (04/16 0630) Resp:  [9-30] 16 (04/16 0630) BP: (73-116)/(38-93) 96/63 mmHg (04/16 0630) SpO2:  [79 %-100 %] 100 % (04/16 0630) Weight:  [201 lb 4.5 oz (91.3 kg)-203 lb 0.7 oz (92.1 kg)] 203 lb 0.7 oz (92.1 kg) (04/16 0350) Last BM Date: 12/12/14  Filed Weights   12/11/14 0500 12/11/14 1756 12/12/14 0350  Weight: 197 lb 5 oz (89.5 kg) 201 lb 4.5 oz (91.3 kg) 203 lb 0.7 oz (92.1 kg)    Intake/Output Summary (Last 24 hours) at 12/12/14 0933 Last data filed at 12/12/14 0606  Gross per 24 hour  Intake 2606.36 ml  Output      0 ml  Net 2606.36 ml    Telemetry: Atrial fibrillation heart rate in the 90 to 100 range.  Exam:  General: Chronically ill-appearing, no distress.  Neck: supple  Lungs: Decreased breath sounds bases  Cardiac: Irregular  Abdomen: NT/ND, no masses  Extremities: Status post right AKA, ulceration and cellulitic changes left foot.  Lab Results:  Basic Metabolic Panel:  Recent Labs Lab 12/09/14 0438 12/11/14 0522 12/12/14 0250  NA 138 138 138  K 3.6 3.8 3.9  CL 102 100 99  CO2 26 27 24   GLUCOSE 120* 109* 91  BUN 23 17 21   CREATININE 4.49* 4.03* 5.16*  CALCIUM 7.8* 7.9* 8.2*    Liver Function Tests:  Recent Labs Lab 12/01/2014 1415 12/09/14 0438 12/12/14 0250  AST 57* 43* 38*  ALT 28 23 18   ALKPHOS 155* 129* 116  BILITOT 4.5* 5.0* 5.0*  PROT 7.9 6.7 6.3  ALBUMIN 2.3* 2.0* 1.5*    CBC:  Recent Labs Lab 12/09/14 0438 12/10/14 0454 12/11/14 0522  WBC 13.8* 18.3* 18.3*  HGB 9.3* 9.0* 8.6*  HCT 29.4* 28.0* 26.8*  MCV 88.3 87.2 87.0  PLT 219 246 233    Coagulation:  Recent Labs Lab 12/09/14 0438 12/10/14 0454 12/11/14 0522  INR 1.74* 2.05* 2.72*      Echocardiogram (TEE) 08/19/2014: Study Conclusions  - Left ventricle: Systolic function was severely reduced. The estimated ejection fraction was 15%. Diffuse hypokinesis. - Aortic valve: Cusp separation was reduced. There was mild stenosis. - Mitral valve: A bioprosthesis was present. There was mild regurgitation. - Left atrium: The atrium was severely dilated. No evidence of thrombus in the atrial cavity; LAA ligated. There was moderatecontinuous spontaneous echo contrast (&quot;smoke&quot;) in the cavity. - Right ventricle: The cavity size was mildly dilated. Systolic function was severely reduced. - Right atrium: The atrium was mildly dilated. - Atrial septum: No defect or patent foramen ovale was identified. - Tricuspid valve: No evidence of vegetation.  Impressions:  - Severe global reduction in LV function; 4 chamber enlargement; left atrium with spontaneous contrast but no thrombus; LAA ligated; severe RV dysfunction; bioprosthetic MV with mild MR; calcifed aortic valve with mild AS (AVA 1.6 by planimetry; mean gradient 10 mmHg); mild TR.   Medications:   Scheduled Medications: . allopurinol  100 mg Oral Daily  . amiodarone  150 mg Intravenous Once  . antiseptic oral rinse  7 mL Mouth Rinse BID  . epoetin (EPOGEN/PROCRIT) injection  10,000 Units Intravenous Q T,Th,Sa-HD  . famotidine  20 mg Oral BID  . FLUoxetine  20  mg Oral Daily  . insulin aspart  0-5 Units Subcutaneous QHS  . insulin aspart  0-9 Units Subcutaneous TID WC  . midodrine  10 mg Oral TID WC  . piperacillin-tazobactam (ZOSYN)  IV  2.25 g Intravenous Q8H  . sodium chloride  3 mL Intravenous Q12H  . traZODone  50 mg Oral QHS  . vancomycin  125 mg Oral QID  . vancomycin  1,000 mg Intravenous Q T,Th,Sa-HD    Infusions: . sodium chloride 10 mL/hr at 12/12/14 0452  . amiodarone 30 mg/hr (12/12/14 0600)    PRN Medications: sodium chloride, sodium chloride, albuterol,  feeding supplement (NEPRO CARB STEADY), heparin, HYDROcodone-acetaminophen, lidocaine (PF), lidocaine-prilocaine, metoprolol, ondansetron **OR** ondansetron (ZOFRAN) IV, pentafluoroprop-tetrafluoroeth   Assessment:   1. Persistent atrial fibrillation/flutter with RVR, now placed on IV amiodarone with better heart rate control but still mildly elevated. Coumadin on hold for possible surgery. Last cardioversion was in December 2015. It looks like he went back into atrial fibrillation sometime in March. Known history of PAF, on oral amiodarone at home.  2. Cardiomyopathy, LVEF approximately 15% as of December 2015 at TEE. Have not pushed for ICD in light of comorbidities and increase risk of infection. BP will not allow beta blocker or ACEI at this point.  3. History of bioprosthetic mitral valve replacement with pericardial valve for severe MR along with CABG x4, left atrial appendage closure and right-sided Maze procedure May of 2008.  4. Left lower extremity cellulitis with gangrene and ulceration with previous right AKA. On broad-spectrum antibiotic coverage. Extensive PAD by follow-up ABI, amputation being considered by surgery.   5. End-stage renal disease on hemodialysis.  6. History of orthostatic hypotension, on Midrin at home.  7. Type 2 diabetes mellitus with nephropathy.   Plan/Discussion:    Patient remains in atrial fibrillation. His heart rate has improved but still mildly elevated. Continue IV amiodarone. His blood pressure will not tolerate beta blockade or calcium blocker. Coumadin is on hold for possible surgery. We will resume postoperatively. Can consider TEE guided cardioversion once all surgical seizures complete if heart rate difficult to control. His ejection fraction is severely reduced. Not a candidate for beta blocker, hydralazine/nitrates or ACE inhibitor given hypotension. Not a candidate for ICD given end-stage renal disease. Possible amputation tomorrow morning.  Surgery following. Will need to notify nephrology for dialysis. Other issues per primary care.   Kirk Ruths, M.D., F.A.C.C.

## 2014-12-12 NOTE — Consult Note (Signed)
Reason for Consult:  Necrotic 3rd toe on left foot Referring Physician:   [facility]  [name] [name] is an 64 y.o. male.  HPI:   64 yo male with multiple medical problems who was transferred from the medical service at [facility] last evening to the Hospitalist Service here at [facility].  He has a left foot with a necrotic, dry gangrenous toe that does need an amputation.  It was felt that he was to complex from a medical standpoint to tolerate surgery at [facility], thus necessitating a transfer.  He reports that this has been a slowly evolving problem with his foot.  Past Medical History  Diagnosis Date  . UGI bleed 02/12/11    On anticoagulation; EGD w/snare polypectomy-multiple polypoid lesions antrum(benign), Chronic active gastritis (NEGATIVE H pylori)  . Anemia, chronic disease   . Sepsis due to enterococcus 02/11/11  . Paroxysmal atrial fibrillation     On coumadin  . Coronary atherosclerosis of native coronary artery     a. Multivessel status post CABG 2008.  . Cardiomyopathy   . Type 2 diabetes mellitus   . Essential hypertension, benign   . Hyperlipemia   . Gout   . S/P patent foramen ovale closure   . Hyperbilirubinemia 08/10/2013  . SBO (small bowel obstruction) 01/2014  . 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 Clostridium difficile colitis 01/2014  . Diverticulosis   . Internal hemorrhoids   . ESRD on hemodialysis     Started 2008-2009. Dr Hinda Lenis on a TTS schedule - R forearm AVF for several years    . Orthostatic hypotension   . S/P mitral valve replacement with bioprosthetic valve     a. 2008 at time of CABG.  . Chronic systolic CHF (congestive heart failure)     a. Last EF 15% in 07/2014.  Marland Kitchen Loculated pleural effusion     a. chronic right loculated pleural effusion s/p chest tube 10/2014.  Marland Kitchen RBBB     a. Intermittent.     Past Surgical History  Procedure Laterality Date  . Mitral valve replacement  01/05/2007    Bioprosthetic 95m Edwards precordial  tissue (Dr. GServando Snare  . Coronary artery bypass graft  01/05/2007    LIMA-LAD, SVG-OM, seq. SVG-PDA, PLA-RCA  . Colonoscopy  06/23/2011    Dr. FOneida Alar multiple sessile and pedunculated polyps, moderate diverticulosis, internal hemorrhoids, +ADENOMATOUS POLYPS, consider genetic testing, surveillance in October 2015   . Esophagogastroduodenoscopy  02/12/2011    CHRONIC ACTIVE GASTRITIS, NO H. pylori  . Transthoracic echocardiogram  11/2011    EF 25% w/ mod dilatation of LV & mod LVH; LA severely dilated; mod-severe dilation of RV with mod-severe decrease in RV function; mild MR & TR  . Cardioversion  04/19/2007    Dr. RMarella Chimes . Transthoracic echocardiogram  02/14/2011    EF 40-45%, mod conc LVH; ventricular septum with motion showing abnormal function, dyssynergy, paradox; mild AS; mild-mod MR stenosis; LA severely dilated; aptrial septum bowed from L to R with increased LA pressure   . Amputation Right 07/14/2013    Procedure: AMPUTATION DIGIT;  Surgeon: [name] MD;  Location: [facility] ORS;  Service: General;  Laterality: Right;  . Amputation Right 08/08/2013    Procedure: AMPUTATION BELOW KNEE;  Surgeon: [name] MD;  Location: [facility] ORS;  Service: General;  Laterality: Right;  . Stump revision Right 09/19/2013    Procedure: STUMP REVISION;  Surgeon: WScherry Ran MD;  Location: [facility]  ORS;  Service: General;  Laterality: Right;  . Amputation Right 10/17/2013    Procedure: AMPUTATION ABOVE KNEE;  Surgeon: [name], MD;  Location: [facility] ORS;  Service: General;  Laterality: Right;  . Colonoscopy N/A 04/10/2014    Procedure: COLONOSCOPY;  Surgeon: [name], MD;  Location: [facility] ENDO SUITE;  Service: Endoscopy;  Laterality: N/A;  830  . Tee without cardioversion N/A 08/19/2014    Procedure: TRANSESOPHAGEAL ECHOCARDIOGRAM (TEE);  Surgeon: [name], MD;  Location: [facility] ENDOSCOPY;  Service: Cardiovascular;  Laterality: N/A;  . Cardioversion N/A 08/19/2014    Procedure:  CARDIOVERSION;  Surgeon: [name], MD;  Location: [facility] ENDOSCOPY;  Service: Cardiovascular;  Laterality: N/A;    Family History  Problem Relation Age of Onset  . Diabetes Mother   . Diabetes Father   . Diabetes Sister   . Colon cancer Neg Hx   . Colon polyps Neg Hx     Social History:  reports that he quit smoking about 8 years ago. His smoking use included Cigarettes. He started smoking about 45 years ago. He has a 8 pack-year smoking history. He has never used smokeless tobacco. He reports that he does not drink alcohol or use illicit drugs.  Allergies:  Allergies  Allergen Reactions  . Bacitracin Other (See Comments)    unknown  . Norvasc [Amlodipine Besylate] Other (See Comments)    unknown    Medications: I have reviewed the patient's current medications.  Results for orders placed or performed during the hospital encounter of 12/01/2014 (from the past 48 hour(s))  Lactic acid, plasma     Status: Abnormal   Collection Time: 12/10/14 10:32 AM  Result Value Ref Range   Lactic Acid, Venous 2.2 (HH) 0.5 - 2.0 mmol/L    Comment: CRITICAL RESULT CALLED TO, READ BACK BY AND VERIFIED WITH: PHILLIPS,C AT 11:10AM ON 12/10/14 BY FESTERMAN,C   Glucose, capillary     Status: Abnormal   Collection Time: 12/10/14 11:47 AM  Result Value Ref Range   Glucose-Capillary 144 (H) 70 - 99 mg/dL   Comment 1 Notify RN    Comment 2 Document in Chart   Hepatitis B surface antigen     Status: None   Collection Time: 12/10/14 12:57 PM  Result Value Ref Range   Hepatitis B Surface Ag NEGATIVE NEGATIVE    Comment: Performed at [facility]  Hepatitis B surface antibody     Status: None   Collection Time: 12/10/14 12:57 PM  Result Value Ref Range   Hep B S Ab Reactive     Comment: (NOTE)              Non Reactive: Inconsistent with immunity,                            less than 10 mIU/mL              Reactive:     Consistent with immunity,                            greater than  9.9 mIU/mL Performed At: [facility] [place], [place] [id number] Lindon Romp MD [id number]   Glucose, capillary     Status: None   Collection Time: 12/10/14  4:37 PM  Result Value Ref Range   Glucose-Capillary 96 70 - 99 mg/dL   Comment 1  Notify RN    Comment 2 Document in Chart   Glucose, capillary     Status: Abnormal   Collection Time: 12/10/14  9:32 PM  Result Value Ref Range   Glucose-Capillary 125 (H) 70 - 99 mg/dL  Protime-INR     Status: Abnormal   Collection Time: 12/11/14  5:22 AM  Result Value Ref Range   Prothrombin Time 29.1 (H) 11.6 - 15.2 seconds   INR 2.72 (H) 0.00 - 1.49  CBC     Status: Abnormal   Collection Time: 12/11/14  5:22 AM  Result Value Ref Range   WBC 18.3 (H) 4.0 - 10.5 K/uL   RBC 3.08 (L) 4.22 - 5.81 MIL/uL   Hemoglobin 8.6 (L) 13.0 - 17.0 g/dL   HCT 26.8 (L) 39.0 - 52.0 %   MCV 87.0 78.0 - 100.0 fL   MCH 27.9 26.0 - 34.0 pg   MCHC 32.1 30.0 - 36.0 g/dL   RDW 17.0 (H) 11.5 - 15.5 %   Platelets 233 150 - 400 K/uL  Basic metabolic panel     Status: Abnormal   Collection Time: 12/11/14  5:22 AM  Result Value Ref Range   Sodium 138 135 - 145 mmol/L   Potassium 3.8 3.5 - 5.1 mmol/L   Chloride 100 96 - 112 mmol/L   CO2 27 19 - 32 mmol/L   Glucose, Bld 109 (H) 70 - 99 mg/dL   BUN 17 6 - 23 mg/dL   Creatinine, Ser 4.03 (H) 0.50 - 1.35 mg/dL   Calcium 7.9 (L) 8.4 - 10.5 mg/dL   GFR calc non Af Amer 14 (L) >90 mL/min   GFR calc Af Amer 17 (L) >90 mL/min    Comment: (NOTE) The eGFR has been calculated using the CKD EPI equation. This calculation has not been validated in all clinical situations. eGFR's persistently <90 mL/min signify possible Chronic Kidney Disease.    Anion gap 11 5 - 15  Phosphorus     Status: None   Collection Time: 12/11/14  5:22 AM  Result Value Ref Range   Phosphorus 2.5 2.3 - 4.6 mg/dL  Glucose, capillary     Status: None   Collection Time: 12/11/14  8:27 AM  Result Value Ref Range    Glucose-Capillary 99 70 - 99 mg/dL   Comment 1 Notify RN    Comment 2 Document in Chart   Clostridium Difficile by PCR     Status: Abnormal   Collection Time: 12/11/14 10:12 AM  Result Value Ref Range   C difficile by pcr POSITIVE (A) NEGATIVE    Comment: CRITICAL RESULT CALLED TO, READ BACK BY AND VERIFIED WITH: S.HEATH AT 3295 ON 12/11/14 BY S.VANHOORNE   Glucose, capillary     Status: Abnormal   Collection Time: 12/11/14 11:14 AM  Result Value Ref Range   Glucose-Capillary 163 (H) 70 - 99 mg/dL   Comment 1 Notify RN    Comment 2 Document in Chart   Glucose, capillary     Status: Abnormal   Collection Time: 12/11/14  4:13 PM  Result Value Ref Range   Glucose-Capillary 155 (H) 70 - 99 mg/dL   Comment 1 Notify RN   Glucose, capillary     Status: Abnormal   Collection Time: 12/11/14  9:10 PM  Result Value Ref Range   Glucose-Capillary 113 (H) 70 - 99 mg/dL   Comment 1 Notify RN   Lactic acid, plasma     Status: None   Collection Time: 12/11/14  9:30 PM  Result Value Ref Range   Lactic Acid, Venous 1.5 0.5 - 2.0 mmol/L  Glucose, capillary     Status: Abnormal   Collection Time: 12/11/14 11:12 PM  Result Value Ref Range   Glucose-Capillary 103 (H) 70 - 99 mg/dL   Comment 1 Notify RN   Comprehensive metabolic panel     Status: Abnormal   Collection Time: 12/12/14  2:50 AM  Result Value Ref Range   Sodium 138 135 - 145 mmol/L   Potassium 3.9 3.5 - 5.1 mmol/L   Chloride 99 96 - 112 mmol/L   CO2 24 19 - 32 mmol/L   Glucose, Bld 91 70 - 99 mg/dL   BUN 21 6 - 23 mg/dL   Creatinine, Ser 5.16 (H) 0.50 - 1.35 mg/dL   Calcium 8.2 (L) 8.4 - 10.5 mg/dL   Total Protein 6.3 6.0 - 8.3 g/dL   Albumin 1.5 (L) 3.5 - 5.2 g/dL   AST 38 (H) 0 - 37 U/L   ALT 18 0 - 53 U/L   Alkaline Phosphatase 116 39 - 117 U/L   Total Bilirubin 5.0 (H) 0.3 - 1.2 mg/dL   GFR calc non Af Amer 11 (L) >90 mL/min   GFR calc Af Amer 12 (L) >90 mL/min    Comment: (NOTE) The eGFR has been calculated using the  CKD EPI equation. This calculation has not been validated in all clinical situations. eGFR's persistently <90 mL/min signify possible Chronic Kidney Disease.    Anion gap 15 5 - 15    [place] Lower Ext Art Left  12/10/2014   CLINICAL DATA:  65 year old male with decreased pulses and right above the knee amputation.  EXAM: UNILATERAL RIGHT LOWER EXTREMITY ARTERIAL DUPLEX SCAN  TECHNIQUE: Gray-scale sonography as well as color Doppler and duplex ultrasound was performed to evaluate the arteries of the lower extremity.  COMPARISON:  Prior CT pelvis 09/09/2014  FINDINGS: Sonographic interrogation of the right lower extremity vessels demonstrates severe ulcerated, irregular and calcified plaque beginning in the common femoral artery and extending to the ankle. Biphasic pulses are present within the common femoral and superficial femoral arteries. There is no evidence of focal stenosis. The deep femoral artery is patent.  The popliteal artery is heavily diseased and likely occluded for a short segment. Minimal flow is present within the anterior tibial and posterior tibial arteries.  IMPRESSION: 1. Heavily diseased popliteal artery with occlusion in the mid segment. 2. Very limited flow within the runoff vessels likely via collateral flow. 3. Irregular and calcified plaque throughout the common femoral and superficial femoral arteries without focal stenosis. 4. The profunda femoral artery remains patent. Signed,  Criselda Peaches, MD  [facility]  [facility]   Electronically Signed   By: Jacqulynn Cadet M.D.   On: 12/10/2014 15:40    ROS Blood pressure 96/63, pulse 99, temperature 97.3 F (36.3 C), temperature source Oral, resp. rate 16, height 6' 4"  (1.93 m), weight 92.1 kg (203 lb 0.7 oz), SpO2 100 %. Physical Exam  Musculoskeletal:       Feet:   He also has several diabetic ulcers on the plantar aspect of his foot that are all try and at least  partial-thickness   Assessment/Plan: Left foot with necrotic 3rd toe 1)  At the minimum he needs a left 3rd toe amputation.  May also benefit from a vascular surgery consult to assess possibility of improving the perfusion to his foot.  He may eventually  need a higher amputation level further down the road if his other foot ulcers worsen, but from my standpoint, all I would recommend for his acute foot needs is a left 3rd toe amputation. 2)  I do not plan on surgery today since he has not been seen yet by the primary service today.  I would like to post him for surgery tomorrow pending input from [facility].  He was seen at [facility] by Cardiology and Nephrology as well and today I think is his regular dialysis day.  BLACKMAN,CHRISTOPHER Y 12/12/2014, 9:10 AM

## 2014-12-12 NOTE — Progress Notes (Signed)
Jonette Eva, NP notified of patient's low BP. New orders received for 500cc NS bolus. Will continue to monitor.

## 2014-12-13 ENCOUNTER — Encounter (HOSPITAL_COMMUNITY): Admission: EM | Disposition: E | Payer: Self-pay | Source: Home / Self Care | Attending: Internal Medicine

## 2014-12-13 ENCOUNTER — Inpatient Hospital Stay (HOSPITAL_COMMUNITY): Payer: Medicare Other | Admitting: Anesthesiology

## 2014-12-13 HISTORY — PX: AMPUTATION: SHX166

## 2014-12-13 LAB — COMPREHENSIVE METABOLIC PANEL
ALBUMIN: 1.5 g/dL — AB (ref 3.5–5.2)
ALK PHOS: 122 U/L — AB (ref 39–117)
ALT: 18 U/L (ref 0–53)
AST: 47 U/L — AB (ref 0–37)
Anion gap: 16 — ABNORMAL HIGH (ref 5–15)
BILIRUBIN TOTAL: 5.1 mg/dL — AB (ref 0.3–1.2)
BUN: 11 mg/dL (ref 6–23)
CO2: 20 mmol/L (ref 19–32)
Calcium: 7.8 mg/dL — ABNORMAL LOW (ref 8.4–10.5)
Chloride: 100 mmol/L (ref 96–112)
Creatinine, Ser: 3.35 mg/dL — ABNORMAL HIGH (ref 0.50–1.35)
GFR calc Af Amer: 21 mL/min — ABNORMAL LOW (ref >90)
GFR calc non Af Amer: 18 mL/min — ABNORMAL LOW (ref >90)
GLUCOSE: 124 mg/dL — AB (ref 70–99)
Potassium: 4.4 mmol/L (ref 3.5–5.1)
Sodium: 136 mmol/L (ref 135–145)
TOTAL PROTEIN: 6.4 g/dL (ref 6.0–8.3)

## 2014-12-13 LAB — GLUCOSE, CAPILLARY
GLUCOSE-CAPILLARY: 137 mg/dL — AB (ref 70–99)
Glucose-Capillary: 166 mg/dL — ABNORMAL HIGH (ref 70–99)
Glucose-Capillary: 147 mg/dL — ABNORMAL HIGH (ref 70–99)

## 2014-12-13 LAB — CULTURE, BLOOD (ROUTINE X 2)
Culture: NO GROWTH
Culture: NO GROWTH

## 2014-12-13 LAB — CBC
HCT: 27.8 % — ABNORMAL LOW (ref 39.0–52.0)
HEMOGLOBIN: 9.1 g/dL — AB (ref 13.0–17.0)
MCH: 27.1 pg (ref 26.0–34.0)
MCHC: 32.7 g/dL (ref 30.0–36.0)
MCV: 82.7 fL (ref 78.0–100.0)
Platelets: 241 10*3/uL (ref 150–400)
RBC: 3.36 MIL/uL — AB (ref 4.22–5.81)
RDW: 17.6 % — ABNORMAL HIGH (ref 11.5–15.5)
WBC: 22.8 10*3/uL — ABNORMAL HIGH (ref 4.0–10.5)

## 2014-12-13 LAB — PROTIME-INR
INR: 3.61 — AB (ref 0.00–1.49)
Prothrombin Time: 36.2 seconds — ABNORMAL HIGH (ref 11.6–15.2)

## 2014-12-13 SURGERY — AMPUTATION DIGIT
Anesthesia: Regional | Site: Foot | Laterality: Left

## 2014-12-13 MED ORDER — LIDOCAINE HCL (CARDIAC) 20 MG/ML IV SOLN
INTRAVENOUS | Status: AC
  Filled 2014-12-13: qty 5

## 2014-12-13 MED ORDER — SODIUM CHLORIDE 0.9 % IV SOLN
Freq: Once | INTRAVENOUS | Status: AC
  Administered 2014-12-13 (×2): via INTRAVENOUS

## 2014-12-13 MED ORDER — SODIUM CHLORIDE 0.9 % IV BOLUS (SEPSIS)
500.0000 mL | Freq: Once | INTRAVENOUS | Status: AC
  Administered 2014-12-13: 500 mL via INTRAVENOUS

## 2014-12-13 MED ORDER — METOPROLOL TARTRATE 1 MG/ML IV SOLN
2.5000 mg | Freq: Once | INTRAVENOUS | Status: AC
  Administered 2014-12-13: 2.5 mg via INTRAVENOUS

## 2014-12-13 MED ORDER — ROCURONIUM BROMIDE 50 MG/5ML IV SOLN
INTRAVENOUS | Status: AC
  Filled 2014-12-13: qty 1

## 2014-12-13 MED ORDER — PHENYLEPHRINE HCL 10 MG/ML IJ SOLN
10.0000 mg | INTRAMUSCULAR | Status: DC | PRN
  Administered 2014-12-13: 80 ug/min via INTRAVENOUS

## 2014-12-13 MED ORDER — SUCCINYLCHOLINE CHLORIDE 20 MG/ML IJ SOLN
INTRAMUSCULAR | Status: AC
  Filled 2014-12-13: qty 1

## 2014-12-13 MED ORDER — MEPIVACAINE HCL 1.5 % IJ SOLN
INTRAMUSCULAR | Status: DC | PRN
  Administered 2014-12-13: 25 mL via PERINEURAL

## 2014-12-13 MED ORDER — PHENYLEPHRINE 40 MCG/ML (10ML) SYRINGE FOR IV PUSH (FOR BLOOD PRESSURE SUPPORT)
PREFILLED_SYRINGE | INTRAVENOUS | Status: AC
  Filled 2014-12-13: qty 10

## 2014-12-13 MED ORDER — BUPIVACAINE HCL (PF) 0.25 % IJ SOLN
INTRAMUSCULAR | Status: AC
  Filled 2014-12-13: qty 30

## 2014-12-13 MED ORDER — METOPROLOL TARTRATE 12.5 MG HALF TABLET
12.5000 mg | ORAL_TABLET | Freq: Two times a day (BID) | ORAL | Status: DC
  Administered 2014-12-13 (×2): 12.5 mg via ORAL
  Filled 2014-12-13 (×4): qty 1

## 2014-12-13 MED ORDER — PHENYLEPHRINE HCL 10 MG/ML IJ SOLN
INTRAMUSCULAR | Status: DC | PRN
  Administered 2014-12-13: 80 ug via INTRAVENOUS
  Administered 2014-12-13: 60 ug via INTRAVENOUS

## 2014-12-13 MED ORDER — WARFARIN - PHARMACIST DOSING INPATIENT: Freq: Every day | Status: DC

## 2014-12-13 MED ORDER — PROPOFOL 10 MG/ML IV BOLUS
INTRAVENOUS | Status: AC
  Filled 2014-12-13: qty 20

## 2014-12-13 MED ORDER — SODIUM CHLORIDE 0.9 % IR SOLN
Status: DC | PRN
  Administered 2014-12-13: 3000 mL

## 2014-12-13 MED ORDER — SODIUM CHLORIDE 0.9 % IJ SOLN
INTRAMUSCULAR | Status: AC
  Filled 2014-12-13: qty 10

## 2014-12-13 MED ORDER — FENTANYL CITRATE (PF) 250 MCG/5ML IJ SOLN
INTRAMUSCULAR | Status: AC
  Filled 2014-12-13: qty 5

## 2014-12-13 MED ORDER — GLYCOPYRROLATE 0.2 MG/ML IJ SOLN
INTRAMUSCULAR | Status: AC
  Filled 2014-12-13: qty 3

## 2014-12-13 MED ORDER — ARTIFICIAL TEARS OP OINT
TOPICAL_OINTMENT | OPHTHALMIC | Status: AC
  Filled 2014-12-13: qty 3.5

## 2014-12-13 MED ORDER — NEOSTIGMINE METHYLSULFATE 10 MG/10ML IV SOLN
INTRAVENOUS | Status: AC
  Filled 2014-12-13: qty 1

## 2014-12-13 MED ORDER — MIDAZOLAM HCL 2 MG/2ML IJ SOLN
INTRAMUSCULAR | Status: AC
  Filled 2014-12-13: qty 2

## 2014-12-13 MED ORDER — ONDANSETRON HCL 4 MG/2ML IJ SOLN
INTRAMUSCULAR | Status: AC
  Filled 2014-12-13: qty 2

## 2014-12-13 MED ORDER — DIGOXIN 0.25 MG/ML IJ SOLN
0.2500 mg | INTRAMUSCULAR | Status: AC
  Administered 2014-12-13 (×2): 0.25 mg via INTRAVENOUS
  Filled 2014-12-13 (×2): qty 1

## 2014-12-13 MED ORDER — EPHEDRINE SULFATE 50 MG/ML IJ SOLN
INTRAMUSCULAR | Status: AC
  Filled 2014-12-13: qty 1

## 2014-12-13 MED ORDER — FENTANYL CITRATE (PF) 250 MCG/5ML IJ SOLN
INTRAMUSCULAR | Status: DC | PRN
  Administered 2014-12-13: 50 ug via INTRAVENOUS

## 2014-12-13 MED ORDER — SODIUM CHLORIDE 0.9 % IV BOLUS (SEPSIS): 500.0000 mL | Freq: Once | INTRAVENOUS | Status: DC

## 2014-12-13 SURGICAL SUPPLY — 49 items
BANDAGE GAUZE 4  KLING STR (GAUZE/BANDAGES/DRESSINGS) IMPLANT
BLADE AVERAGE 25MMX9MM (BLADE)
BLADE AVERAGE 25X9 (BLADE) IMPLANT
BLADE MINI RND TIP GREEN BEAV (BLADE) IMPLANT
BNDG COHESIVE 1X5 TAN STRL LF (GAUZE/BANDAGES/DRESSINGS) IMPLANT
BNDG COHESIVE 4X5 TAN STRL (GAUZE/BANDAGES/DRESSINGS) ×3 IMPLANT
BNDG COHESIVE 6X5 TAN STRL LF (GAUZE/BANDAGES/DRESSINGS) IMPLANT
BNDG GAUZE ELAST 4 BULKY (GAUZE/BANDAGES/DRESSINGS) ×3 IMPLANT
BNDG GAUZE STRTCH 6 (GAUZE/BANDAGES/DRESSINGS) IMPLANT
CORDS BIPOLAR (ELECTRODE) ×3 IMPLANT
COVER SURGICAL LIGHT HANDLE (MISCELLANEOUS) ×3 IMPLANT
CUFF TOURNIQUET SINGLE 18IN (TOURNIQUET CUFF) IMPLANT
CUFF TOURNIQUET SINGLE 24IN (TOURNIQUET CUFF) IMPLANT
CUFF TOURNIQUET SINGLE 34IN LL (TOURNIQUET CUFF) IMPLANT
DRAPE U-SHAPE 47X51 STRL (DRAPES) ×3 IMPLANT
DURAPREP 26ML APPLICATOR (WOUND CARE) ×3 IMPLANT
ELECT REM PT RETURN 9FT ADLT (ELECTROSURGICAL) ×3
ELECTRODE REM PT RTRN 9FT ADLT (ELECTROSURGICAL) ×1 IMPLANT
GAUZE SPONGE 2X2 8PLY STRL LF (GAUZE/BANDAGES/DRESSINGS) IMPLANT
GAUZE SPONGE 4X4 12PLY STRL (GAUZE/BANDAGES/DRESSINGS) ×3 IMPLANT
GAUZE XEROFORM 1X8 LF (GAUZE/BANDAGES/DRESSINGS) ×3 IMPLANT
GLOVE BIO SURGEON STRL SZ8 (GLOVE) ×3 IMPLANT
GLOVE BIOGEL PI IND STRL 8 (GLOVE) IMPLANT
GLOVE BIOGEL PI INDICATOR 8 (GLOVE)
GLOVE ORTHO TXT STRL SZ7.5 (GLOVE) ×3 IMPLANT
GOWN STRL REUS W/ TWL LRG LVL3 (GOWN DISPOSABLE) IMPLANT
GOWN STRL REUS W/ TWL XL LVL3 (GOWN DISPOSABLE) ×2 IMPLANT
GOWN STRL REUS W/TWL LRG LVL3 (GOWN DISPOSABLE)
GOWN STRL REUS W/TWL XL LVL3 (GOWN DISPOSABLE) ×4
KIT BASIN OR (CUSTOM PROCEDURE TRAY) ×3 IMPLANT
KIT ROOM TURNOVER OR (KITS) ×3 IMPLANT
MANIFOLD NEPTUNE II (INSTRUMENTS) ×3 IMPLANT
NEEDLE HYPO 25GX1X1/2 BEV (NEEDLE) IMPLANT
NS IRRIG 1000ML POUR BTL (IV SOLUTION) ×3 IMPLANT
PACK ORTHO EXTREMITY (CUSTOM PROCEDURE TRAY) ×3 IMPLANT
PAD ARMBOARD 7.5X6 YLW CONV (MISCELLANEOUS) ×6 IMPLANT
PAD CAST 4YDX4 CTTN HI CHSV (CAST SUPPLIES) IMPLANT
PADDING CAST COTTON 4X4 STRL (CAST SUPPLIES)
SPECIMEN JAR SMALL (MISCELLANEOUS) ×3 IMPLANT
SPONGE GAUZE 2X2 STER 10/PKG (GAUZE/BANDAGES/DRESSINGS)
SUCTION FRAZIER TIP 10 FR DISP (SUCTIONS) ×3 IMPLANT
SUT ETHILON 2 0 FS 18 (SUTURE) ×6 IMPLANT
SUT VIC AB 2-0 FS1 27 (SUTURE) IMPLANT
SYR CONTROL 10ML LL (SYRINGE) IMPLANT
TOWEL OR 17X24 6PK STRL BLUE (TOWEL DISPOSABLE) ×3 IMPLANT
TOWEL OR 17X26 10 PK STRL BLUE (TOWEL DISPOSABLE) ×3 IMPLANT
TUBE CONNECTING 12'X1/4 (SUCTIONS) ×1
TUBE CONNECTING 12X1/4 (SUCTIONS) ×2 IMPLANT
WATER STERILE IRR 1000ML POUR (IV SOLUTION) IMPLANT

## 2014-12-13 NOTE — Progress Notes (Signed)
[facility] TEAM 1 - Stepdown/ICU TEAM Progress Note  JAMIE BELGER XNT:[id number] DOB: 04-Oct-1950 DOA: 12/15/2014 PCP: [name], MD  Admit HPI / Brief Narrative: 64 year old male with history of CAD status post CABG 2008 with mitral valve replacement (bioprostetic), diabetes mellitus, atrial fibrillation on Coumadin and amiodarone status post cardioversion in 2015, ESRD, chronic systolic heart failure with an EF of 15% in 2015, right AKA, and a right chronic loculated pleural effusion who came to [facility] for left foot pain and was found to have cellulitis and gangrene with severe sepsis (lactic acid of 6).   MRI showed deep ulceration but no evidence of osteo.  ABIs showed a heavily diseased popliteal artery with occlusion in the mid segmentand very limited flow within the runoff vessels.  On 04/14 his RVR became hard to control andhe was started on IV amiodarone and Cardiology was consulted.  04/14 after after dialysis he became hypotensive and had to be given multiple boluses of normal saline. Cardiology and IM deemed him high risk of surgery and he was therefore transferred to [facility].  HPI/Subjective: The patient is sedate having returned from the operating room this morning.  He does not appear to be uncomfortable and is in no apparent acute distress at this time.  He is not able to provide history due to his sedation.  Assessment/Plan:  Left lower extremity cellulitis with gangrene and ulceration / necrotic 3rd toe Orthopedics is following with [place] - status post I and D of left foot with third ray amputation - continue empiric antibiotics for now - will need eventual more extensive arterial evaluation  Persistent atrial fibrillation/flutter with RVR Remains on amiodarone drip - heart rate currently elevated again - Cardiology is adjusting medical therapy but his RVR is proving to be quite refractory - resume Coumadin therapy in a.m.  Chronic hypotension Blood pressure  remains low with systolics in the 96P but this appears to be his baseline - MAP is 60  End-stage renal disease on hemodialysis Nephrology following  Cardiomyopathy / severe chronic systolic congestive heart failure EF 15% December 2015 TEE Care will be exercised with volume administration - follow daily weights and strict Is - not a candidate for ACE inhibitor due to baseline hypertension - baseline weight appears to be approximately 80 kg - patient up approximately 10 kg at this point but without evidence of severe volume overload  CAD Multivessel status post CABG x4 2008 Cardiology following - not a candidate for beta blockers or nitrates due to baseline hypotension  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 difficile colitis Continue oral vancomycin as per C. difficile treatment protocol - no evidence at present of acute complications  Type 2 diabetes mellitus with nephropathy CBGs currently well controlled  Code Status: FULL Family Communication: Discussed plan of care with wife at bedside Disposition Plan: SDU  Consultants: Nephrology Cardiology  Orthopedics   Procedures: None  Antibiotics: Zosyn 4/12 > Vanc 4/12 >  DVT prophylaxis: SCDs  Objective: Blood pressure 100/50, pulse 141, temperature 97.9 F (36.6 C), temperature source Oral, resp. rate 18, height 6\' 4"  (1.93 m), weight 92.3 kg (203 lb 7.8 oz), SpO2 100 %.  Intake/Output Summary (Last 24 hours) at 12/20/2014 1055 Last data filed at 12/05/2014 0600  Gross per 24 hour  Intake  680.8 ml  Output   2000 ml  Net -1319.2 ml  Exam: General: No acute respiratory distress - sedated Lungs: Clear to auscultation bilaterally without wheezes or crackles Cardiovascular: Tachycardic at 130 bpm without murmur gallop or rub - JVD to angle of jaw Abdomen: Nontender, nondistended, soft, bowel sounds positive, no  rebound, no ascites, no appreciable mass Extremities: Status post right AKA - left foot dressed and dry post amputation/I&D  Data Reviewed: Basic Metabolic Panel:  Recent Labs Lab 12/04/2014 1415 12/09/14 0438 12/11/14 0522 12/12/14 0250 12/20/2014 0308  NA 136 138 138 138 136  K 3.8 3.6 3.8 3.9 4.4  CL 95* 102 100 99 100  CO2 26 26 27 24 20   GLUCOSE 193* 120* 109* 91 124*  BUN 20 23 17 21 11   CREATININE 3.96* 4.49* 4.03* 5.16* 3.35*  CALCIUM 7.9* 7.8* 7.9* 8.2* 7.8*  PHOS  --   --  2.5  --   --     Liver Function Tests:  Recent Labs Lab 12/25/2014 1415 12/09/14 0438 12/12/14 0250 12/12/2014 0308  AST 57* 43* 38* 47*  ALT 28 23 18 18   ALKPHOS 155* 129* 116 122*  BILITOT 4.5* 5.0* 5.0* 5.1*  PROT 7.9 6.7 6.3 6.4  ALBUMIN 2.3* 2.0* 1.5* 1.5*   Coags:  Recent Labs Lab 12/09/14 0438 12/10/14 0454 12/11/14 0522 12/12/14 1253 12/07/2014 0308  INR 1.74* 2.05* 2.72* 3.71* 3.61*   CBC:  Recent Labs Lab 12/09/2014 1415 12/09/14 0438 12/10/14 0454 12/11/14 0522 12/12/14 1401 12/11/2014 0308  WBC 17.6* 13.8* 18.3* 18.3* 21.7* 22.8*  NEUTROABS 15.7*  --   --   --   --   --   HGB 9.7* 9.3* 9.0* 8.6* 8.9* 9.1*  HCT 31.0* 29.4* 28.0* 26.8* 26.3* 27.8*  MCV 88.8 88.3 87.2 87.0 81.4 82.7  PLT 333 219 246 233 254 241   CBG:  Recent Labs Lab 12/11/14 2110 12/11/14 2312 12/12/14 0818 12/12/14 1250 12/12/14 2132  GLUCAP 113* 103* 111* 91 95    Recent Results (from the past 240 hour(s))  Blood Culture (routine x 2)     Status: None   Collection Time: 12/24/2014  2:48 PM  Result Value Ref Range Status   Specimen Description BLOOD LEFT FOREARM  Final   Special Requests BOTTLES DRAWN AEROBIC ONLY 4CC  Final   Culture NO GROWTH 5 DAYS  Final   Report Status 12/17/2014 FINAL  Final  Blood Culture (routine x 2)     Status: None   Collection Time: 12/07/2014  3:00 PM  Result Value Ref Range Status   Specimen Description BLOOD RIGHT HAND  Final   Special Requests   Final     BOTTLES DRAWN AEROBIC AND ANAEROBIC AEB=6CC ANA=4CC   Culture NO GROWTH 5 DAYS  Final   Report Status 12/09/2014 FINAL  Final  MRSA PCR Screening     Status: None   Collection Time: 12/24/2014  6:40 PM  Result Value Ref Range Status   MRSA by PCR NEGATIVE NEGATIVE Final    Comment:        The GeneXpert MRSA Assay (FDA approved for NASAL specimens only), is one component of a comprehensive MRSA colonization surveillance program. It is not intended to diagnose MRSA infection nor to guide or monitor treatment for MRSA infections.   Clostridium Difficile by PCR     Status: Abnormal   Collection Time: 12/11/14 10:12 AM  Result Value Ref Range Status   C difficile by pcr POSITIVE (A) NEGATIVE Final    Comment: CRITICAL RESULT CALLED TO, READ BACK BY  AND VERIFIED WITH: S.HEATH AT 0354 ON 12/11/14 BY S.VANHOORNE      Studies:   Recent x-ray studies have been reviewed in detail by the Attending Physician  Scheduled Meds:  Scheduled Meds: . allopurinol  100 mg Oral Daily  . amiodarone  150 mg Intravenous Once  . antiseptic oral rinse  7 mL Mouth Rinse BID  . darbepoetin (ARANESP) injection - DIALYSIS  100 mcg Intravenous Q Sat-HD  . digoxin  0.25 mg Intravenous Q4H  . famotidine  20 mg Oral Daily  . FLUoxetine  20 mg Oral Daily  . insulin aspart  0-5 Units Subcutaneous QHS  . insulin aspart  0-9 Units Subcutaneous TID WC  . metoprolol tartrate  12.5 mg Oral BID  . midodrine  10 mg Oral TID WC  . piperacillin-tazobactam (ZOSYN)  IV  2.25 g Intravenous Q8H  . sodium chloride  500 mL Intravenous Once  . sodium chloride  3 mL Intravenous Q12H  . traZODone  50 mg Oral QHS  . vancomycin  125 mg Oral QID  . vancomycin  1,000 mg Intravenous Q T,Th,Sa-HD    Time spent on care of this patient: 35 mins   MCCLUNG,JEFFREY T , MD   [facility] Office  [phone] Pager - Text Page per Shea Evans as per below:  On-Call/Text Page:      [email or web address]      password [email or web address]  If 7PM-7AM,  please contact night-coverage [email or web address] Password [email or web address] 12/16/2014, 10:55 AM   LOS: 5 days

## 2014-12-13 NOTE — Transfer of Care (Signed)
Immediate Anesthesia Transfer of Care Note  Patient: [name] [name]  Procedure(s) Performed: Procedure(s): AMPUTATION LEFT THIRD TOE (Left)  Patient Location: PACU  Anesthesia Type:MAC  Level of Consciousness: awake and alert   Airway & Oxygen Therapy: Patient Spontanous Breathing and Patient connected to nasal cannula oxygen  Post-op Assessment: Report given to RN and Post -op Vital signs reviewed and stable  Post vital signs: Reviewed and stable  Last Vitals:  Filed Vitals:   11/29/2014 0833  BP:   Pulse: 142  Temp:   Resp: 18    Complications: No apparent anesthesia complications

## 2014-12-13 NOTE — Anesthesia Procedure Notes (Signed)
Anesthesia Regional Block:  Ankle block  Pre-Anesthetic Checklist: ,, timeout performed, Correct Patient, Correct Site, Correct Laterality, Correct Procedure,, site marked, risks and benefits discussed, Surgical consent, Pre-op evaluation,  At surgeon's request  Laterality: Left  Prep: chloraprep       Needles:  Injection technique: Single-shot      Needle Gauge: 25 and 25 G    Additional Needles: Ankle block Narrative:   Performed by: Personally  Anesthesiologist: Suzette Battiest  Additional Notes: A functioning IV was confirmed and monitors were applied.  Sterile prep and drape, hand hygiene and sterile gloves were used.  Negative aspiration and test dose prior to incremental administration of local anesthetic using the 25 ga needle. 5 ports used.  The patient tolerated the procedure well.

## 2014-12-13 NOTE — Anesthesia Preprocedure Evaluation (Addendum)
Anesthesia Evaluation  Patient identified by MRN, date of birth, ID band Patient awake    Reviewed: Allergy & Precautions, NPO status , Patient's Chart, lab work & pertinent test results  Airway Mallampati: I  TM Distance: >3 FB Neck ROM: Full    Dental  (+) Poor Dentition, Dental Advisory Given   Pulmonary former smoker,  breath sounds clear to auscultation        Cardiovascular hypertension, Pt. on home beta blockers and Pt. on medications + CAD and +CHF + dysrhythmias Atrial Fibrillation Rhythm:Irregular Rate:Tachycardia     Neuro/Psych negative neurological ROS     GI/Hepatic   Endo/Other  diabetes, Well Controlled  Renal/GU ESRF and DialysisRenal diseaseK 4.4     Musculoskeletal   Abdominal   Peds  Hematology  (+) anemia , Hgb 9.1   Anesthesia Other Findings   Reproductive/Obstetrics                           Anesthesia Physical Anesthesia Plan  ASA: IV  Anesthesia Plan: Regional   Post-op Pain Management:    Induction: Intravenous  Airway Management Planned: Simple Face Mask and Natural Airway  Additional Equipment:   Intra-op Plan:   Post-operative Plan: Extubation in OR  Informed Consent: I have reviewed the patients History and Physical, chart, labs and discussed the procedure including the risks, benefits and alternatives for the proposed anesthesia with the patient or authorized representative who has indicated his/her understanding and acceptance.   Dental advisory given  Plan Discussed with: CRNA  Anesthesia Plan Comments: (Ankle block with standard monitors.)       Anesthesia Quick Evaluation

## 2014-12-13 NOTE — Progress Notes (Signed)
  Assessment/Plan 1. S/p Toe(Ray) amputation- 2. A.Fib with RVR- stable Hrt rate now/ card following on Amiodarone BP not able to handle BB 3. CAD with CABG/ Sig CM with EF 15 %/History of bioprosthetic mitral valve replacement with pericardial valve for severe MR - increase HD time slightly to help prevent bp drop on hd  4. ESRD - HD today on Schedule TTS/ needed to increase time to 4 hours sec.               Will check CXR to r/o CHF, suspect will need HD Monday for volume 5. Hypertension/volume - currently 117/85 BP Attempt 2 l uf /has some pedal edema with CXR no excess fluid/ on Midodrine  6. Anemia - On epo/ use Aranesp 100 q sat in mch with hgb 8.6 7. Metabolic bone disease - No vit d / Ca 8.2 ( correct 10) use 2.0 bath Today/ on sensipar and renvela binder 8. Nutrition - Alb 1.5 carb mod renal diet Needs Protein supplement  9. DM - per admit  Subjective: Interval History: Rapid afib and low BP  Objective: Vital signs in last 24 hours: Temp:  [97.6 F (36.4 C)-98.7 F (37.1 C)] 97.9 F (36.6 C) (04/17 0833) Pulse Rate:  [69-142] 139 (04/17 1100) Resp:  [12-27] 26 (04/17 1100) BP: (67-123)/(16-67) 98/64 mmHg (04/17 1201) SpO2:  [92 %-100 %] 100 % (04/17 1100) Weight:  [92.3 kg (203 lb 7.8 oz)] 92.3 kg (203 lb 7.8 oz) (04/16 1740) Weight change: 3.1 kg (6 lb 13.4 oz)  Intake/Output from previous day: 04/16 0701 - 04/17 0700 In: 680.8 [P.O.:280; I.V.:400.8] Out: 2000  Intake/Output this shift:    General appearance: alert and cooperative GI: left foot bandaged Extremities: Right BKA, left foot bandaged  Lab Results:  Recent Labs  12/12/14 1401 12/03/2014 0308  WBC 21.7* 22.8*  HGB 8.9* 9.1*  HCT 26.3* 27.8*  PLT 254 241   BMET:  Recent Labs  12/12/14 0250 11/28/2014 0308  NA 138 136  K 3.9 4.4  CL 99 100  CO2 24 20  GLUCOSE 91 124*  BUN 21 11  CREATININE 5.16* 3.35*  CALCIUM 8.2* 7.8*   No results for input(s): PTH in the last 72  hours. Iron Studies: No results for input(s): IRON, TIBC, TRANSFERRIN, FERRITIN in the last 72 hours. Studies/Results: No results found.  Scheduled: . allopurinol  100 mg Oral Daily  . amiodarone  150 mg Intravenous Once  . antiseptic oral rinse  7 mL Mouth Rinse BID  . darbepoetin (ARANESP) injection - DIALYSIS  100 mcg Intravenous Q Sat-HD  . digoxin  0.25 mg Intravenous Q4H  . famotidine  20 mg Oral Daily  . FLUoxetine  20 mg Oral Daily  . insulin aspart  0-5 Units Subcutaneous QHS  . insulin aspart  0-9 Units Subcutaneous TID WC  . metoprolol tartrate  12.5 mg Oral BID  . midodrine  10 mg Oral TID WC  . piperacillin-tazobactam (ZOSYN)  IV  2.25 g Intravenous Q8H  . sodium chloride  500 mL Intravenous Once  . sodium chloride  3 mL Intravenous Q12H  . traZODone  50 mg Oral QHS  . vancomycin  125 mg Oral QID  . vancomycin  1,000 mg Intravenous Q T,Th,Sa-HD  . Warfarin - Pharmacist Dosing Inpatient   Does not apply q1800    LOS: 5 days   Jenine Krisher C 12/08/2014,2:16 PM

## 2014-12-13 NOTE — Progress Notes (Addendum)
Primary cardiologist: Dr. Satira Sark  Seen for followup: Atrial fibrillation/flutter, cardiomyopathy  Subjective:    Denies dyspnea or chest pain  Objective:   Temp:  [97.6 F (36.4 C)-98.7 F (37.1 C)] 97.9 F (36.6 C) (04/17 0833) Pulse Rate:  [69-142] 141 (04/17 0900) Resp:  [12-26] 18 (04/17 0915) BP: (81-124)/(24-67) 100/50 mmHg (04/17 0900) SpO2:  [92 %-100 %] 100 % (04/17 0900) Weight:  [203 lb 7.8 oz (92.3 kg)-208 lb 1.8 oz (94.4 kg)] 203 lb 7.8 oz (92.3 kg) (04/16 1740) Last BM Date: 12/22/2014  Filed Weights   12/12/14 0350 12/12/14 1330 12/12/14 1740  Weight: 203 lb 0.7 oz (92.1 kg) 208 lb 1.8 oz (94.4 kg) 203 lb 7.8 oz (92.3 kg)    Intake/Output Summary (Last 24 hours) at 12/16/2014 1009 Last data filed at 12/04/2014 0600  Gross per 24 hour  Intake  680.8 ml  Output   2000 ml  Net -1319.2 ml    Telemetry: Atrial fibrillation heart rate in the 90 to 100 range.  Exam:  General: Chronically ill-appearing, no distress.  Neck: supple  Lungs: Decreased breath sounds bases  Cardiac: Irregular  Abdomen: NT/ND, no masses  Extremities: Status post right AKA, ulceration and cellulitic changes left foot.  Lab Results:  Basic Metabolic Panel:  Recent Labs Lab 12/11/14 0522 12/12/14 0250 12/19/2014 0308  NA 138 138 136  K 3.8 3.9 4.4  CL 100 99 100  CO2 27 24 20   GLUCOSE 109* 91 124*  BUN 17 21 11   CREATININE 4.03* 5.16* 3.35*  CALCIUM 7.9* 8.2* 7.8*    Liver Function Tests:  Recent Labs Lab 12/09/14 0438 12/12/14 0250 12/10/2014 0308  AST 43* 38* 47*  ALT 23 18 18   ALKPHOS 129* 116 122*  BILITOT 5.0* 5.0* 5.1*  PROT 6.7 6.3 6.4  ALBUMIN 2.0* 1.5* 1.5*    CBC:  Recent Labs Lab 12/11/14 0522 12/12/14 1401 12/20/2014 0308  WBC 18.3* 21.7* 22.8*  HGB 8.6* 8.9* 9.1*  HCT 26.8* 26.3* 27.8*  MCV 87.0 81.4 82.7  PLT 233 254 241    Coagulation:  Recent Labs Lab 12/11/14 0522 12/12/14 1253 12/25/2014 0308  INR 2.72* 3.71*  3.61*    Echocardiogram (TEE) 08/19/2014: Study Conclusions  - Left ventricle: Systolic function was severely reduced. The estimated ejection fraction was 15%. Diffuse hypokinesis. - Aortic valve: Cusp separation was reduced. There was mild stenosis. - Mitral valve: A bioprosthesis was present. There was mild regurgitation. - Left atrium: The atrium was severely dilated. No evidence of thrombus in the atrial cavity; LAA ligated. There was moderatecontinuous spontaneous echo contrast (&quot;smoke&quot;) in the cavity. - Right ventricle: The cavity size was mildly dilated. Systolic function was severely reduced. - Right atrium: The atrium was mildly dilated. - Atrial septum: No defect or patent foramen ovale was identified. - Tricuspid valve: No evidence of vegetation.  Impressions:  - Severe global reduction in LV function; 4 chamber enlargement; left atrium with spontaneous contrast but no thrombus; LAA ligated; severe RV dysfunction; bioprosthetic MV with mild MR; calcifed aortic valve with mild AS (AVA 1.6 by planimetry; mean gradient 10 mmHg); mild TR.   Medications:   Scheduled Medications: . allopurinol  100 mg Oral Daily  . amiodarone  150 mg Intravenous Once  . antiseptic oral rinse  7 mL Mouth Rinse BID  . darbepoetin (ARANESP) injection - DIALYSIS  100 mcg Intravenous Q Sat-HD  . famotidine  20 mg Oral Daily  . FLUoxetine  20 mg  Oral Daily  . insulin aspart  0-5 Units Subcutaneous QHS  . insulin aspart  0-9 Units Subcutaneous TID WC  . midodrine  10 mg Oral TID WC  . piperacillin-tazobactam (ZOSYN)  IV  2.25 g Intravenous Q8H  . sodium chloride  500 mL Intravenous Once  . sodium chloride  3 mL Intravenous Q12H  . traZODone  50 mg Oral QHS  . vancomycin  125 mg Oral QID  . vancomycin  1,000 mg Intravenous Q T,Th,Sa-HD    Infusions: . amiodarone 30 mg/hr (12/05/2014 0700)    PRN Medications: sodium chloride, sodium chloride,  acetaminophen, feeding supplement (NEPRO CARB STEADY), heparin, HYDROcodone-acetaminophen, lidocaine (PF), lidocaine-prilocaine, metoprolol, ondansetron **OR** ondansetron (ZOFRAN) IV, pentafluoroprop-tetrafluoroeth   Assessment:   1. Persistent atrial fibrillation/flutter with RVR, now placed on IV amiodarone, heart rate still elevated. Last cardioversion was in December 2015. It looks like he went back into atrial fibrillation sometime in March. Known history of PAF, on oral amiodarone at home.  2. Cardiomyopathy, LVEF approximately 15% as of December 2015 at TEE.   3. History of bioprosthetic mitral valve replacement with pericardial valve for severe MR along with CABG x4, left atrial appendage closure and right-sided Maze procedure May of 2008.  4. Left lower extremity cellulitis with gangrene and ulceration; previous right AKA. On broad-spectrum antibiotic coverage. Extensive PAD by follow-up ABI, amputation of digit performed this AM.   5. End-stage renal disease on hemodialysis.  6. History of orthostatic hypotension, on Midodrine at home.  7. Type 2 diabetes mellitus with nephropathy.   Plan/Discussion:    Patient remains in atrial fibrillation (WCT this AM; ? Aberrancy vs VT). His heart rate has improved but still elevated. Continue IV amiodarone. Add low dose metoprolol to see if BP tolerates. Resume coumadin now that digit amputation complete. Can consider TEE guided cardioversion once all surgical seizures complete if heart rate difficult to control. His ejection fraction is severely reduced. Not a candidate for hydralazine/nitrates or ACE inhibitor given hypotension. Not a candidate for ICD given end-stage renal disease. Other issues per primary care.   Kirk Ruths, M.D., F.A.C.C.  Addendum-HR remains elevated following 2.5 metoprolol IV; SBP 84; will give digoxin 0.25 mg IV and repeat in 4 hours; would not continue Lusty term given ESRD and interaction with  amiodarone. Kirk Ruths

## 2014-12-13 NOTE — Progress Notes (Signed)
After a total of 2 liter of normal saline PT bp 90/30 but HR is still 130's-140, paged M Lynch no new orders given at this time will continue to monitor

## 2014-12-13 NOTE — Progress Notes (Signed)
Patient ID: [name] [name], male   DOB: February 04, 1951, 64 y.o.   MRN: [id number] The plan is to proceed to the OR this am for an amputation of his left 3rd toe due to profound ischemia and gangrene.  He understands this fully.

## 2014-12-13 NOTE — Progress Notes (Signed)
After a total of 1.5 liter bolus pt.s HR still 130-140's AFIB with rvr 30 mg amiodarone still infusing BP 93/35 paged Jonette Eva who ordered 500 cc bolus will continue to monitor

## 2014-12-13 NOTE — Anesthesia Postprocedure Evaluation (Signed)
  Anesthesia Post-op Note  Patient: [name] [name]  Procedure(s) Performed: Procedure(s): AMPUTATION LEFT THIRD TOE (Left)  Patient Location: PACU  Anesthesia Type:MAC and Regional  Level of Consciousness: awake and alert   Airway and Oxygen Therapy: Patient Spontanous Breathing  Post-op Pain: none  Post-op Assessment: Post-op Vital signs reviewed  Post-op Vital Signs: Reviewed  Last Vitals:  Filed Vitals:   11/30/2014 0900  BP: 100/50  Pulse: 141  Temp:   Resp:     Complications: No apparent anesthesia complications

## 2014-12-13 NOTE — Progress Notes (Signed)
Orthopedic Tech Progress Note Patient Details:  [name] [name] 13-Sep-1950 [id number]  Ortho Devices Type of Ortho Device: Postop shoe/boot Ortho Device/Splint Location: lle Ortho Device/Splint Interventions: Application   [name] [name] 12/07/2014, 9:31 AM

## 2014-12-13 NOTE — Op Note (Signed)
[name], [name] NO.:  [id number]  MEDICAL RECORD NO.:  [id number]  LOCATION:  3S04C                        FACILITY:  [facility]  PHYSICIAN:  [name], M.D.DATE OF BIRTH:  1950-11-12  DATE OF PROCEDURE:  12/21/2014 DATE OF DISCHARGE:                              OPERATIVE REPORT   PREOPERATIVE DIAGNOSIS:  Left foot infection with necrotic ischemic gangrenous third toe.  POSTOPERATIVE DIAGNOSIS:  Left foot infection with necrotic ischemic gangrenous third toe.  PROCEDURES: 1. Irrigation and debridement of left foot including excision and     debridement of necrotic skin and fascia. 2. Left foot third ray amputation.  FINDINGS:  Necrotic gangrenous left foot third toe with overwhelming infection in the deep tissues of the left foot.  SURGEON:  Jean Rosenthal, MD.  ANESTHESIA:  Local left ankle block.  ANTIBIOTICS:  Currently on vancomycin and Zosyn.  BLOOD LOSS:  Minimal.  COMPLICATIONS:  None.  INDICATIONS:  [name] is a 64 year old individual with multiple medical problems who was transferred from [facility] a day ago due to necrotic left foot third toe.  He has been quite tachycardic with a high white blood cell count.  He is recommended due to the necrotic gangrenous toe and obvious infection in his foot today.  I and D of his foot and amputation of the third toe is warranted.  He understands that he wears a prosthesis on his right leg due to previous amputation on his foot.  He does have several pressure wounds that are all dry in his heel, lateral foot, and under the first MTP joint, but his third ray is going to be addressed today.  PROCEDURE DESCRIPTION:  After informed consent was obtained, appropriate left foot was marked.  Anesthesia obtained with a local block of the left ankle.  He was then brought to the operating room, placed supine on the operating table.  His left foot was prepped with DuraPrep  and sterile drapes.  Time-out was called to identify correct patient, correct left foot.  There was certainly a significant odor around the foot.  I excised out the great toe with amputation first of the great toe and then found gross purulence on the soft tissues.  I then used an oscillating saw and cut the third metatarsal proximal to expose more of the ray.  We used a #10 blade to ellipse out an eschar under the third ray and necrotic tissue all around the third ray of the necrotic fascia. I then used 3 L of pulsatile lavage solution to completely lavage out the foot in this area.  I then loosely reapproximated the skin with interrupted 2-0 nylon suture.  Xeroform and well-padded sterile dressing was applied.  He was taken back to the recovery room in stable condition.  All final counts were correct. There were no complications noted.  Postoperatively, he can weightbear as tolerated on that foot but if he does not clear this infection or has further ischemic changes, he may end up with an amputation of this leg as well.     [name], M.D.     CYB/MEDQ  D:  12/12/2014  T:  12/16/2014  Job:  [id number]

## 2014-12-13 NOTE — Brief Op Note (Signed)
12/26/2014 - 12/13/2014  8:29 AM  PATIENT:  [name] [name]  64 y.o. male  PRE-OPERATIVE DIAGNOSIS:  ischemic, gangrene left 3rd toe  POST-OPERATIVE DIAGNOSIS:  ischemic, gangrene left 3rd toe  PROCEDURE:  Procedure(s): AMPUTATION LEFT THIRD TOE (Left)  SURGEON:  Surgeon(s) and Role:    * [name], MD - Primary  ASSISTANTS: none   ANESTHESIA:   regional  EBL:   minimal  BLOOD ADMINISTERED:none  DRAINS: none   LOCAL MEDICATIONS USED:  NONE  SPECIMEN:  No Specimen  DISPOSITION OF SPECIMEN:  N/A  COUNTS:  YES  TOURNIQUET:    DICTATION: .Other Dictation: Dictation Number [id number]  PLAN OF CARE: Admit to inpatient   PATIENT DISPOSITION:  PACU - hemodynamically stable.   Delay start of Pharmacological VTE agent (>24hrs) due to surgical blood loss or risk of bleeding: no

## 2014-12-14 ENCOUNTER — Inpatient Hospital Stay (HOSPITAL_COMMUNITY): Payer: Medicare Other

## 2014-12-14 ENCOUNTER — Ambulatory Visit: Payer: Medicare Other | Admitting: Cardiothoracic Surgery

## 2014-12-14 ENCOUNTER — Encounter (HOSPITAL_COMMUNITY): Payer: Self-pay | Admitting: Orthopaedic Surgery

## 2014-12-14 DIAGNOSIS — I5023 Acute on chronic systolic (congestive) heart failure: Secondary | ICD-10-CM | POA: Diagnosis present

## 2014-12-14 DIAGNOSIS — J9600 Acute respiratory failure, unspecified whether with hypoxia or hypercapnia: Secondary | ICD-10-CM

## 2014-12-14 LAB — CBC
HEMATOCRIT: 28.8 % — AB (ref 39.0–52.0)
HEMOGLOBIN: 9.5 g/dL — AB (ref 13.0–17.0)
MCH: 26.8 pg (ref 26.0–34.0)
MCHC: 33 g/dL (ref 30.0–36.0)
MCV: 81.1 fL (ref 78.0–100.0)
Platelets: 307 10*3/uL (ref 150–400)
RBC: 3.55 MIL/uL — ABNORMAL LOW (ref 4.22–5.81)
RDW: 17.5 % — ABNORMAL HIGH (ref 11.5–15.5)
WBC: 27.6 10*3/uL — ABNORMAL HIGH (ref 4.0–10.5)

## 2014-12-14 LAB — GLUCOSE, CAPILLARY
GLUCOSE-CAPILLARY: 121 mg/dL — AB (ref 70–99)
GLUCOSE-CAPILLARY: 100 mg/dL — AB (ref 70–99)
Glucose-Capillary: 118 mg/dL — ABNORMAL HIGH (ref 70–99)
Glucose-Capillary: 123 mg/dL — ABNORMAL HIGH (ref 70–99)
Glucose-Capillary: 93 mg/dL (ref 70–99)

## 2014-12-14 LAB — BLOOD GAS, ARTERIAL
Acid-base deficit: 7 mmol/L — ABNORMAL HIGH (ref 0.0–2.0)
Acid-base deficit: 2.8 mmol/L — ABNORMAL HIGH (ref 0.0–2.0)
Bicarbonate: 17.6 mEq/L — ABNORMAL LOW (ref 20.0–24.0)
Bicarbonate: 22 mEq/L (ref 20.0–24.0)
Drawn by: 43098
Drawn by: 277551
FIO2: 0.31 %
FIO2: 0.45 %
O2 Saturation: 81.6 %
O2 Saturation: 71.1 %
Patient temperature: 98.6
Patient temperature: 98.6
TCO2: 18.6 mmol/L (ref 0–100)
TCO2: 23.3 mmol/L (ref 0–100)
pCO2 arterial: 33 mmHg — ABNORMAL LOW (ref 35.0–45.0)
pCO2 arterial: 41.6 mmHg (ref 35.0–45.0)
pH, Arterial: 7.346 — ABNORMAL LOW (ref 7.350–7.450)
pH, Arterial: 7.344 — ABNORMAL LOW (ref 7.350–7.450)
pO2, Arterial: 51.2 mmHg — ABNORMAL LOW (ref 80.0–100.0)
pO2, Arterial: 42.8 mmHg — ABNORMAL LOW (ref 80.0–100.0)

## 2014-12-14 LAB — RENAL FUNCTION PANEL
Albumin: 1.5 g/dL — ABNORMAL LOW (ref 3.5–5.2)
Anion gap: 17 — ABNORMAL HIGH (ref 5–15)
BUN: 22 mg/dL (ref 6–23)
CALCIUM: 8.5 mg/dL (ref 8.4–10.5)
CO2: 20 mmol/L (ref 19–32)
Chloride: 102 mmol/L (ref 96–112)
Creatinine, Ser: 4.42 mg/dL — ABNORMAL HIGH (ref 0.50–1.35)
GFR, EST AFRICAN AMERICAN: 15 mL/min — AB (ref >90)
GFR, EST NON AFRICAN AMERICAN: 13 mL/min — AB (ref >90)
GLUCOSE: 128 mg/dL — AB (ref 70–99)
Phosphorus: 3.4 mg/dL (ref 2.3–4.6)
Potassium: 4.8 mmol/L (ref 3.5–5.1)
SODIUM: 139 mmol/L (ref 135–145)

## 2014-12-14 LAB — PREPARE FRESH FROZEN PLASMA: UNIT DIVISION: 0

## 2014-12-14 LAB — PROTIME-INR
INR: 4.43 — AB (ref 0.00–1.49)
Prothrombin Time: 42.5 seconds — ABNORMAL HIGH (ref 11.6–15.2)

## 2014-12-14 MED ORDER — LORAZEPAM 2 MG/ML IJ SOLN
0.5000 mg | Freq: Once | INTRAMUSCULAR | Status: AC
  Administered 2014-12-14: 0.5 mg via INTRAVENOUS

## 2014-12-14 MED ORDER — PROTHROMBIN COMPLEX CONC HUMAN 500 UNITS IV KIT: 35.0000 [IU]/kg | PACK | INTRAVENOUS | Status: DC

## 2014-12-14 MED ORDER — VANCOMYCIN HCL IN DEXTROSE 750-5 MG/150ML-% IV SOLN
750.0000 mg | Freq: Once | INTRAVENOUS | Status: DC
  Filled 2014-12-14: qty 150

## 2014-12-14 MED ORDER — ALBUTEROL SULFATE (2.5 MG/3ML) 0.083% IN NEBU: 2.5000 mg | INHALATION_SOLUTION | Freq: Four times a day (QID) | RESPIRATORY_TRACT | Status: DC

## 2014-12-14 MED ORDER — LEVALBUTEROL HCL 1.25 MG/0.5ML IN NEBU
1.2500 mg | INHALATION_SOLUTION | Freq: Once | RESPIRATORY_TRACT | Status: AC
  Administered 2014-12-14: 1.25 mg via RESPIRATORY_TRACT
  Filled 2014-12-14: qty 0.5

## 2014-12-14 MED ORDER — SODIUM CHLORIDE 0.9 % IV SOLN
Freq: Once | INTRAVENOUS | Status: AC
  Administered 2014-12-14: 16:00:00 via INTRAVENOUS

## 2014-12-14 MED ORDER — HEPARIN SODIUM (PORCINE) 1000 UNIT/ML DIALYSIS
20.0000 [IU]/kg | INTRAMUSCULAR | Status: DC | PRN
  Filled 2014-12-14: qty 2

## 2014-12-14 MED ORDER — VITAMIN K1 10 MG/ML IJ SOLN
10.0000 mg | INTRAVENOUS | Status: AC
  Administered 2014-12-15: 10 mg via INTRAVENOUS
  Filled 2014-12-14: qty 1

## 2014-12-14 NOTE — Progress Notes (Signed)
ANTIBIOTIC CONSULT NOTE  Pharmacy Consult for Vancomycin and Zosyn  Indication: cellulitis   Allergies  Allergen Reactions  . Bacitracin Other (See Comments)    unknown  . Norvasc [Amlodipine Besylate] Other (See Comments)    unknown    Patient Measurements: Height: 6\' 4"  (193 cm) Weight: 212 lb 15.4 oz (96.6 kg) IBW/kg (Calculated) : 86.8  Vital Signs: Temp: 98 F (36.7 C) (04/18 1133) Temp Source: Axillary (04/18 1133) BP: 90/55 mmHg (04/18 1400) Pulse Rate: 107 (04/18 1400) Intake/Output from previous day: 04/17 0701 - 04/18 0700 In: 580.8 [P.O.:180; I.V.:400.8] Out: -  Intake/Output from this shift:    Labs:  Recent Labs  12/12/14 0250 12/12/14 1401 12/04/2014 0308 12/14/14 0227  WBC  --  21.7* 22.8* 27.6*  HGB  --  8.9* 9.1* 9.5*  PLT  --  254 241 307  CREATININE 5.16*  --  3.35* 4.42*   Estimated Creatinine Clearance: 20.5 mL/min (by C-G formula based on Cr of 4.42). No results for input(s): VANCOTROUGH, VANCOPEAK, VANCORANDOM, GENTTROUGH, GENTPEAK, GENTRANDOM, TOBRATROUGH, TOBRAPEAK, TOBRARND, AMIKACINPEAK, AMIKACINTROU, AMIKACIN in the last 72 hours.   Microbiology: Recent Results (from the past 720 hour(s))  Clostridium Difficile by PCR     Status: Abnormal   Collection Time: 11/15/14  6:40 AM  Result Value Ref Range Status   C difficile by pcr POSITIVE (A) NEGATIVE Final    Comment: CRITICAL RESULT CALLED TO, READ BACK BY AND VERIFIED WITH: D.MURPHY,RN 11/15/14 @1158  BY V.WILKINS   Blood Culture (routine x 2)     Status: None   Collection Time: 12/22/2014  2:48 PM  Result Value Ref Range Status   Specimen Description BLOOD LEFT FOREARM  Final   Special Requests BOTTLES DRAWN AEROBIC ONLY 4CC  Final   Culture NO GROWTH 5 DAYS  Final   Report Status 12/03/2014 FINAL  Final  Blood Culture (routine x 2)     Status: None   Collection Time: 12/26/2014  3:00 PM  Result Value Ref Range Status   Specimen Description BLOOD RIGHT HAND  Final   Special  Requests   Final    BOTTLES DRAWN AEROBIC AND ANAEROBIC AEB=6CC ANA=4CC   Culture NO GROWTH 5 DAYS  Final   Report Status 12/09/2014 FINAL  Final  MRSA PCR Screening     Status: None   Collection Time: 12/05/2014  6:40 PM  Result Value Ref Range Status   MRSA by PCR NEGATIVE NEGATIVE Final    Comment:        The GeneXpert MRSA Assay (FDA approved for NASAL specimens only), is one component of a comprehensive MRSA colonization surveillance program. It is not intended to diagnose MRSA infection nor to guide or monitor treatment for MRSA infections.   Clostridium Difficile by PCR     Status: Abnormal   Collection Time: 12/11/14 10:12 AM  Result Value Ref Range Status   C difficile by pcr POSITIVE (A) NEGATIVE Final    Comment: CRITICAL RESULT CALLED TO, READ BACK BY AND VERIFIED WITH: S.HEATH AT 1538 ON 12/11/14 BY S.VANHOORNE     Anti-infectives    Start     Dose/Rate Route Frequency Ordered Stop   12/11/14 2300  vancomycin (VANCOCIN) 50 mg/mL oral solution 125 mg     125 mg Oral 4 times daily 12/11/14 2106 12/25/14 1759   12/11/14 1900  vancomycin (VANCOCIN) 50 mg/mL oral solution 125 mg  Status:  Discontinued     125 mg Oral 4 times daily 12/11/14 1840 12/11/14  2106   12/11/14 1600  metroNIDAZOLE (FLAGYL) tablet 500 mg  Status:  Discontinued     500 mg Oral 3 times per day 12/11/14 1556 12/11/14 1840   12/10/14 1200  vancomycin (VANCOCIN) IVPB 1000 mg/200 mL premix     1,000 mg 200 mL/hr over 60 Minutes Intravenous Every T-Th-Sa (Hemodialysis) 12/05/2014 1821     12/09/14 1400  piperacillin-tazobactam (ZOSYN) 2.25 g in dextrose 5 % 50 mL IVPB     2.25 g 100 mL/hr over 30 Minutes Intravenous Every 8 hours 12/09/14 0824     12/16/2014 2200  piperacillin-tazobactam (ZOSYN) IVPB 2.25 g  Status:  Discontinued     2.25 g 100 mL/hr over 30 Minutes Intravenous 3 times per day 11/29/2014 1821 12/09/14 0823   12/07/2014 2000  vancomycin (VANCOCIN) IVPB 1000 mg/200 mL premix     1,000 mg 200  mL/hr over 60 Minutes Intravenous  Once 11/29/2014 1821 12/22/2014 2200   12/18/2014 1430  piperacillin-tazobactam (ZOSYN) IVPB 3.375 g     3.375 g 100 mL/hr over 30 Minutes Intravenous  Once 12/07/2014 1428 12/22/2014 1514   12/26/2014 1430  vancomycin (VANCOCIN) IVPB 1000 mg/200 mL premix     1,000 mg 200 mL/hr over 60 Minutes Intravenous  Once 11/29/2014 1428 12/15/2014 1627      Assessment: 64 yo M with hx DM, ESRD, hx R BKA presented with gangrenous left toe.  He continues on empiric, broad-spectrum antibiotics s/p I&D and third ray amputation.  He received extra HD today as volume overloaded.  Zosyn 4/12>> Vanc 4/12>>  Goal of Therapy:  Eradicate infection. Pre-Hemodialysis Vancomycin level =15-25 mcg/ml  Plan:  Zosyn 2.25gm IV every 8 hours. Vancomycin 1gm IV every HD (TTS) with additional dose of 750 mg IV today Measure antibiotic drug levels at steady state Follow up culture results  Adelma Bowdoin, [place] 12/14/2014,2:31 PM

## 2014-12-14 NOTE — Progress Notes (Signed)
Pt complaining of shortness of breath 02 saturation 96% on 3 liters via nasal cannula, placed patient on Venni mask @4  liters for patients comfort, paged K Schorr who ordered stat ABG, Stat portable and 0.5 ativan will continue to monitor

## 2014-12-14 NOTE — Progress Notes (Signed)
Subjective: Deneis dizziiness  SOme chest pressure  SOB   Objective: Filed Vitals:   12/14/14 0310 12/14/14 0354 12/14/14 0451 12/14/14 0700  BP: 140/109   81/55  Pulse: 115   137  Temp: 98.2 F (36.8 C)   97.7 F (36.5 C)  TempSrc: Oral   Oral  Resp: 27   20  Height:      Weight:      SpO2: 100% 97% 100% 84%   Weight change:   Intake/Output Summary (Last 24 hours) at 12/14/14 0905 Last data filed at 12/14/14 0600  Gross per 24 hour  Intake  580.8 ml  Output      0 ml  Net  580.8 ml    General: Alert, awake, oriented x3, in no acute distress Neck:  JVP is normal Heart: Irreg rate and rhythm  Tachy  No signif murmurs   Lungs: Rales bilateral bases   Exemities:  Tr LLE edema  S/p R AKA     Neuro: Grossly intact, nonfocal.  Tele:  Afib  120s   Lab Results: Results for orders placed or performed during the hospital encounter of 12/22/2014 (from the past 24 hour(s))  Glucose, capillary     Status: Abnormal   Collection Time: 12/17/2014 10:55 AM  Result Value Ref Range   Glucose-Capillary 137 (H) 70 - 99 mg/dL  Glucose, capillary     Status: Abnormal   Collection Time: 12/03/2014  4:44 PM  Result Value Ref Range   Glucose-Capillary 166 (H) 70 - 99 mg/dL   Comment 1 Notify RN    Comment 2 Document in Chart   Glucose, capillary     Status: Abnormal   Collection Time: 12/17/2014  9:26 PM  Result Value Ref Range   Glucose-Capillary 147 (H) 70 - 99 mg/dL  Protime-INR     Status: Abnormal   Collection Time: 12/14/14  2:27 AM  Result Value Ref Range   Prothrombin Time 42.5 (H) 11.6 - 15.2 seconds   INR 4.43 (H) 0.00 - 1.49  Renal function panel     Status: Abnormal   Collection Time: 12/14/14  2:27 AM  Result Value Ref Range   Sodium 139 135 - 145 mmol/L   Potassium 4.8 3.5 - 5.1 mmol/L   Chloride 102 96 - 112 mmol/L   CO2 20 19 - 32 mmol/L   Glucose, Bld 128 (H) 70 - 99 mg/dL   BUN 22 6 - 23 mg/dL   Creatinine, Ser 4.42 (H) 0.50 - 1.35 mg/dL   Calcium 8.5 8.4 - 10.5  mg/dL   Phosphorus 3.4 2.3 - 4.6 mg/dL   Albumin 1.5 (L) 3.5 - 5.2 g/dL   GFR calc non Af Amer 13 (L) >90 mL/min   GFR calc Af Amer 15 (L) >90 mL/min   Anion gap 17 (H) 5 - 15  CBC     Status: Abnormal   Collection Time: 12/14/14  2:27 AM  Result Value Ref Range   WBC 27.6 (H) 4.0 - 10.5 K/uL   RBC 3.55 (L) 4.22 - 5.81 MIL/uL   Hemoglobin 9.5 (L) 13.0 - 17.0 g/dL   HCT 28.8 (L) 39.0 - 52.0 %   MCV 81.1 78.0 - 100.0 fL   MCH 26.8 26.0 - 34.0 pg   MCHC 33.0 30.0 - 36.0 g/dL   RDW 17.5 (H) 11.5 - 15.5 %   Platelets 307 150 - 400 K/uL  Blood gas, arterial     Status: Abnormal   Collection Time:  12/14/14  3:30 AM  Result Value Ref Range   FIO2 0.31 %   Delivery systems VENTURI MASK    pH, Arterial 7.346 (L) 7.350 - 7.450   pCO2 arterial 33.0 (L) 35.0 - 45.0 mmHg   pO2, Arterial 51.2 (L) 80.0 - 100.0 mmHg   Bicarbonate 17.6 (L) 20.0 - 24.0 mEq/L   TCO2 18.6 0 - 100 mmol/L   Acid-base deficit 7.0 (H) 0.0 - 2.0 mmol/L   O2 Saturation 81.6 %   Patient temperature 98.6    Collection site LEFT RADIAL    Drawn by [phone]    Sample type ARTERIAL DRAW    Allens test (pass/fail) PASS PASS  Blood gas, arterial     Status: Abnormal   Collection Time: 12/14/14  7:00 AM  Result Value Ref Range   FIO2 0.45 %   Delivery systems VENTI MASK    pH, Arterial 7.344 (L) 7.350 - 7.450   pCO2 arterial 41.6 35.0 - 45.0 mmHg   pO2, Arterial 42.8 (L) 80.0 - 100.0 mmHg   Bicarbonate 22.0 20.0 - 24.0 mEq/L   TCO2 23.3 0 - 100 mmol/L   Acid-base deficit 2.8 (H) 0.0 - 2.0 mmol/L   O2 Saturation 71.1 %   Patient temperature 98.6    Collection site LEFT BRACHIAL    Drawn by [phone]    Sample type ARTERIAL DRAW   Glucose, capillary     Status: Abnormal   Collection Time: 12/14/14  8:15 AM  Result Value Ref Range   Glucose-Capillary 121 (H) 70 - 99 mg/dL    Studies/Results: No results found.  Medications:  Reviewed     @PROBHOSP @  1  Afib   Currently on IV amiodarone  Rates still increase  BP  will not allow any boluses.  Will not tolerate any there meds for now.  LA are large Conversion then maintaining SR will be challenging    Would increase rate of amio to 1 mg per min  FOllow BP over next hour  May start pressor  2.  Acute on chronic systolic/ diastolic CHF  LVEF 86%  Evid of volume increase on exam   Oxygenation improved with BiPAP  3.  ESRD  Needs dialysis.  Done 2 days ago and not able to take much off  Limited by LVEF/ HR/BP    Unfort overall prognosis is poor.    LOS: 6 days   Dorris Carnes 12/14/2014, 9:05 AM

## 2014-12-14 NOTE — Progress Notes (Signed)
K schorr up on floor ABG came back with o2 of 51% pt paced on 50 % venni mask, pt resting calmly after 0.5 of ativan will give neb treatment and recheck @ 0600

## 2014-12-14 NOTE — Progress Notes (Signed)
Dr. Doran Clay to notify  Of previous night events(ABG, CXR), no new orders given will continue to monitor.

## 2014-12-14 NOTE — Progress Notes (Signed)
[place] TEAM 1 - Stepdown/ICU TEAM Progress Note  KREGG CIHLAR WPY:[id number] DOB: [birth year] DOA: 12/07/2014 PCP: [name], MD  Admit HPI / Brief Narrative: 64 year old male with history of CAD status post CABG 2008 with mitral valve replacement (bioprostetic), diabetes mellitus, atrial fibrillation on Coumadin and amiodarone status post cardioversion in 2015, ESRD, chronic systolic heart failure with an EF of 15% in 2015, right AKA, and a right chronic loculated pleural effusion who came to [facility] for left foot pain and was found to have cellulitis and gangrene with severe sepsis (lactic acid of 6).   MRI showed deep ulceration but no evidence of osteo.  ABIs showed a heavily diseased popliteal artery with occlusion in the mid segmentand very limited flow within the runoff vessels.  On 04/14 his RVR became hard to control andhe was started on IV amiodarone and Cardiology was consulted.  04/14 after after dialysis he became hypotensive and had to be given multiple boluses of normal saline. Cardiology and IM deemed him high risk of surgery and he was therefore transferred to [facility].  HPI/Subjective: We have encountered difficulty with hypoxia today.  The patient however has tolerated this quite well and is alert and interactive.  He denies new complaints and states that he doesn't feel short of breath.  Venturi mask has not consistently kept his sats greater than 90% and therefore BiPAP is being initiated.  Nephrology is concerned about attempting dialysis today given his low blood pressure and persistent tachycardia.  Assessment/Plan:  Left lower extremity cellulitis with gangrene and ulceration / necrotic 3rd toe Orthopedics following - status post I and D of left foot with third ray amputation - continue empiric antibiotics for now as significant purulent material was encountered during the I&D - will need eventual more extensive arterial evaluation  Cardiomyopathy /  severe chronic systolic congestive heart failure EF 15% December 2015 TEE Acute decompensation with pulmonary edema due to volume expansion in attempt to treat RVR - follow daily weights and strict Is - not a candidate for ACE inhibitor due to baseline hypertension - baseline weight appears to be approximately 80 kg - now up 12 kg with pulmonary edema/hypoxia - +~7L this hospital stay - I am consulting the CHF team as the patient will likely require pressor/inotrope support to allow for fluid removal via dialysis  Persistent atrial fibrillation/flutter with RVR  Remains on amiodarone drip - heart rate proving very difficult to control - Cardiology is increasing his amiodarone drip this morning - Coumadin therapy continues  Chronic hypotension Blood pressure remains low with systolics in the 05L but this appears to be his baseline - MAP is 60 - he has however dipped into the 70s multiple occasions this morning  End-stage renal disease on hemodialysis - Tu/Th/Sat  Nephrology following - it would appear the patient would benefit from a dialysis treatment today - nephrology is presently concerned about proceeding due to his hypotension  CAD Multivessel status post CABG x4 2008 Cardiology following - not a candidate for beta blockers or nitrates due to baseline hypotension  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 difficile colitis Continue oral vancomycin as per C. difficile treatment protocol - no evidence at present of acute complications  Type 2 diabetes mellitus with nephropathy CBGs currently well controlled  Code Status: FULL Family Communication: No family present at time of evaluation today Disposition Plan:  SDU  Consultants: Nephrology Cardiology  Orthopedics  CHF Team  Procedures: None  Antibiotics: Zosyn 4/12 > Vanc 4/12 > Oral Vanc 4/12 >  DVT  prophylaxis: SCDs + warfarin  Objective: Blood pressure 102/74, pulse 132, temperature 97.7 F (36.5 C), temperature source Oral, resp. rate 24, height 6\' 4"  (1.93 m), weight 92.3 kg (203 lb 7.8 oz), SpO2 70 %.  Intake/Output Summary (Last 24 hours) at 12/14/14 0925 Last data filed at 12/14/14 0600  Gross per 24 hour  Intake  580.8 ml  Output      0 ml  Net  580.8 ml   Exam: General: Modest acute respiratory distress but currently tolerating BiPAP well - alert and conversant Lungs: Fine crackles diffusely without wheeze Cardiovascular: Tachycardic at 130-140 bpm without murmur gallop or rub - JVD to angle of jaw Abdomen: Nontender, nondistended, soft, bowel sounds positive, no rebound, no ascites, no appreciable mass Extremities: Status post right AKA - left foot dressed and dry post amputation/I&D  Data Reviewed: Basic Metabolic Panel:  Recent Labs Lab 12/09/14 0438 12/11/14 0522 12/12/14 0250 12/16/2014 0308 12/14/14 0227  NA 138 138 138 136 139  K 3.6 3.8 3.9 4.4 4.8  CL 102 100 99 100 102  CO2 26 27 24 20 20   GLUCOSE 120* 109* 91 124* 128*  BUN 23 17 21 11 22   CREATININE 4.49* 4.03* 5.16* 3.35* 4.42*  CALCIUM 7.8* 7.9* 8.2* 7.8* 8.5  PHOS  --  2.5  --   --  3.4    Liver Function Tests:  Recent Labs Lab 12/21/2014 1415 12/09/14 0438 12/12/14 0250 12/26/2014 0308 12/14/14 0227  AST 57* 43* 38* 47*  --   ALT 28 23 18 18   --   ALKPHOS 155* 129* 116 122*  --   BILITOT 4.5* 5.0* 5.0* 5.1*  --   PROT 7.9 6.7 6.3 6.4  --   ALBUMIN 2.3* 2.0* 1.5* 1.5* 1.5*   Coags:  Recent Labs Lab 12/10/14 0454 12/11/14 0522 12/12/14 1253 12/06/2014 0308 12/14/14 0227  INR 2.05* 2.72* 3.71* 3.61* 4.43*   CBC:  Recent Labs Lab 12/04/2014 1415  12/10/14 0454 12/11/14 0522 12/12/14 1401 12/12/2014 0308 12/14/14 0227  WBC 17.6*  < > 18.3* 18.3* 21.7* 22.8* 27.6*  NEUTROABS 15.7*  --   --   --   --   --   --   HGB 9.7*  < > 9.0* 8.6* 8.9* 9.1* 9.5*  HCT 31.0*  < > 28.0*  26.8* 26.3* 27.8* 28.8*  MCV 88.8  < > 87.2 87.0 81.4 82.7 81.1  PLT 333  < > 246 233 254 241 307  < > = values in this interval not displayed. CBG:  Recent Labs Lab 12/12/14 2132 12/16/2014 1055 12/09/2014 1644 12/20/2014 2126 12/14/14 0815  GLUCAP 95 137* 166* 147* 121*    Recent Results (from the past 240 hour(s))  Blood Culture (routine x 2)     Status: None   Collection Time: 12/22/2014  2:48 PM  Result Value Ref Range Status   Specimen Description BLOOD LEFT FOREARM  Final   Special Requests BOTTLES DRAWN AEROBIC ONLY 4CC  Final   Culture NO GROWTH 5 DAYS  Final   Report Status 12/18/2014 FINAL  Final  Blood Culture (routine x 2)     Status: None   Collection Time: 12/18/2014  3:00 PM  Result Value Ref Range Status   Specimen Description BLOOD RIGHT HAND  Final   Special Requests   Final  BOTTLES DRAWN AEROBIC AND ANAEROBIC AEB=6CC ANA=4CC   Culture NO GROWTH 5 DAYS  Final   Report Status 12/12/2014 FINAL  Final  MRSA PCR Screening     Status: None   Collection Time: 12/11/2014  6:40 PM  Result Value Ref Range Status   MRSA by PCR NEGATIVE NEGATIVE Final    Comment:        The GeneXpert MRSA Assay (FDA approved for NASAL specimens only), is one component of a comprehensive MRSA colonization surveillance program. It is not intended to diagnose MRSA infection nor to guide or monitor treatment for MRSA infections.   Clostridium Difficile by PCR     Status: Abnormal   Collection Time: 12/11/14 10:12 AM  Result Value Ref Range Status   C difficile by pcr POSITIVE (A) NEGATIVE Final    Comment: CRITICAL RESULT CALLED TO, READ BACK BY AND VERIFIED WITH: S.HEATH AT 7517 ON 12/11/14 BY S.VANHOORNE      Studies:   Recent x-ray studies have been reviewed in detail by the Attending Physician  Scheduled Meds:  Scheduled Meds: . allopurinol  100 mg Oral Daily  . amiodarone  150 mg Intravenous Once  . antiseptic oral rinse  7 mL Mouth Rinse BID  . darbepoetin (ARANESP)  injection - DIALYSIS  100 mcg Intravenous Q Sat-HD  . famotidine  20 mg Oral Daily  . FLUoxetine  20 mg Oral Daily  . insulin aspart  0-5 Units Subcutaneous QHS  . insulin aspart  0-9 Units Subcutaneous TID WC  . metoprolol tartrate  12.5 mg Oral BID  . midodrine  10 mg Oral TID WC  . piperacillin-tazobactam (ZOSYN)  IV  2.25 g Intravenous Q8H  . sodium chloride  500 mL Intravenous Once  . sodium chloride  3 mL Intravenous Q12H  . traZODone  50 mg Oral QHS  . vancomycin  125 mg Oral QID  . vancomycin  1,000 mg Intravenous Q T,Th,Sa-HD  . Warfarin - Pharmacist Dosing Inpatient   Does not apply q1800    Time spent on care of this patient: 35 mins   [facility] T , MD   [facility] Office  [phone] Pager - Text Page per Amion as per below:  On-Call/Text Page:      [email or web address]      password [email or web address]  If 7PM-7AM, please contact night-coverage [email or web address] Password [email or web address] 12/14/2014, 9:25 AM   LOS: 6 days

## 2014-12-14 NOTE — Progress Notes (Signed)
Pt lost second IV site, attempt for restart made with no success. IV team consulted. Dr. Thereasa Solo called regarding need for more advanced access. New orders received and will implement and continue to monitor.

## 2014-12-14 NOTE — Progress Notes (Signed)
ABG drawn Critical value results were called to Ruthell Rummage Nurse Practitioner.

## 2014-12-14 NOTE — Progress Notes (Signed)
Medicare Important Message given? YES (If response is "NO", the following Medicare IM given date fields will be blank) Date Medicare IM given:12/14/2014 Medicare IM given by: Whitman Hero

## 2014-12-14 NOTE — Progress Notes (Signed)
Shift event note:   Notified at approx 0300 re: pt's c/o SOB. RN stated sats hard to trace but when good plythe 02 sats 97-99% on 3L [place]. RN reported pt increasingly anxious re: SOB though objectively RN reports RR 27-30 w/ faint crackles in bil lower bases. 02 delivery changed to VM at 40%. Stat PCXR, ABG and Ativan 0.5 mg IV ordered. At bedside pt noted resting quietly w/ eyes closed. Awakens easilyand answers questions appropriately. BP-140/90's, T-98.2, HR-115-120 (remains in A-fib on Amiodarone qtt) RR 24-30, w/ 02 sats 97% on VM at 40% (when able to obtain adequate plythe). BBS noted w/ occasional fine crackles at bil lower bases w/ slightly decreased air movement and occasional spastic cough. Admits to being a smoker "for many years", denies known h/o COPD. Pt denies cp. ABG results reveal pH-7.34, pC02-33, p02-51.2 and bicarb of 17.6. PCXR reveals enlarged cardiac silhouette w/ central vascular congestion and mildly increased pulmonary edema pattern. There are R>L pleural effusions, loculated on the (R) similar to prior. After 30 min at bedside pt resting in NAD w/ 02 sats of 97-99% on VM at 50%. RT suggested BiPAP. Assessment/Plan: 1. Acute hypoxemic respiratory failure: In setting of pt w/ ESRD( HD Sat, 2L off though multiple boluses required after HD for hypotension), PCXR suggest increasing pulmonary edema (record indicates pt remains +6L), cardiomyopathy w/ associated severe systolic CHF and persistent A-Fib w/ RVR.  Will give a one-time Xopenex neb. RN to notify renal service just before shift change regarding increased pulmonary edema. Will continue VM at 50% for now and repeat ABG in am. Will defer further changes for now unless pt condition dictates as pt is resting in no distress. Will continue to monitor closely in SDU.   Jeryl Columbia, NP-C [facility] Pager [phone]

## 2014-12-14 NOTE — Progress Notes (Signed)
Patient ID: [name] [name], male   DOB: 01-25-1951, 64 y.o.   MRN: [id number] WBC still high today, but I don't think it is entirely related to his left foot.  I did change his dressing this evening and there as no gross purulence at this point and the wound looks better.  I placed a new dressing.  Given the magnitude of his infection, he needs to be continued on IV antibiotics for the next few days before transitioning oral antibiotics.

## 2014-12-14 NOTE — Progress Notes (Signed)
Advanced Heart Failure Rounding Note   Subjective:    Mr [name] [name] is a 64 year old man with a history of  64 y/o M CAD s/p CABG in 2008 with bioprosthetic mitral valve replacement at that time, ESRD on HD, prior GI bleed, HTN, HLD, PAF on oral amiodarone/Coumadin (s/p TEE/DCCV in 40/9811), chronic systolic dysfunction (last EF 15% in 07/2014), anemia, R AKA, chronic right loculated pleural effusion s/p chest tube 10/2014 .   Admitted to [facility] with necrotic L foot and A fib RVR. Placed on antibiotics.  He was started on cardizem that was later stopped and he was placed on amiodarone drip after he was evaluated by cardiology. Nephrology following along with last HD on 4/14. He transferred to [facility] on April 18 due to increased surgical risk.  BB stopped with hypotension.   He went to OR on 4/17 and had 3rd ray amputation. Over night he developed respiratory distress and placed on increased oxygen and now on BiPap.  CXR with Increased pulmonary edema and R and L pleural effusions --R>L. Hypotensive for the last few hours. Started back on CVVHD a few hours ago.     Objective:   Weight Range:  Vital Signs:   Temp:  [97.7 F (36.5 C)-100.7 F (38.2 C)] 98 F (36.7 C) (04/18 1133) Pulse Rate:  [90-139] 90 (04/18 1230) Resp:  [17-31] 22 (04/18 1230) BP: (81-140)/(45-109) 89/70 mmHg (04/18 1230) SpO2:  [84 %-100 %] 100 % (04/18 1133) FiO2 (%):  [50 %-100 %] 100 % (04/18 0900) Weight:  [212 lb 15.4 oz (96.6 kg)] 212 lb 15.4 oz (96.6 kg) (04/18 1133) Last BM Date: 12/14/14  Weight change: Filed Weights   12/12/14 1330 12/12/14 1740 12/14/14 1133  Weight: 208 lb 1.8 oz (94.4 kg) 203 lb 7.8 oz (92.3 kg) 212 lb 15.4 oz (96.6 kg)    Intake/Output:   Intake/Output Summary (Last 24 hours) at 12/14/14 1236 Last data filed at 12/14/14 0956  Gross per 24 hour  Intake  580.8 ml  Output      0 ml  Net  580.8 ml     Physical Exam: General:  Chronically ill appearing. On BiPAP HEENT: normal Neck:  supple. JVP to jaw. . Carotids 2+ bilat; no bruits. No lymphadenopathy or thryomegaly appreciated. Cor: PMI nondisplaced. Regular rate & rhythm. No rubs, gallops or murmurs. Lungs: Decreased RLL LLL Abdomen: soft, nontender, nondistended. No hepatosplenomegaly. No bruits or masses. Good bowel sounds. Extremities: no cyanosis, clubbing, rash, edema R AKA LLE foot dressing in tact Neuro: drowsy but does arouse. On Bipap. Has R AKA. moves all 4 extremities w/o difficulty. Affect pleasant  Telemetry:  A Fib 100-120  Labs: Basic Metabolic Panel:  Recent Labs Lab 12/09/14 0438 12/11/14 0522 12/12/14 0250 12/22/2014 0308 12/14/14 0227  NA 138 138 138 136 139  K 3.6 3.8 3.9 4.4 4.8  CL 102 100 99 100 102  CO2 26 27 24 20 20   GLUCOSE 120* 109* 91 124* 128*  BUN 23 17 21 11 22   CREATININE 4.49* 4.03* 5.16* 3.35* 4.42*  CALCIUM 7.8* 7.9* 8.2* 7.8* 8.5  PHOS  --  2.5  --   --  3.4    Liver Function Tests:  Recent Labs Lab 12/11/2014 1415 12/09/14 0438 12/12/14 0250 12/20/2014 0308 12/14/14 0227  AST 57* 43* 38* 47*  --   ALT 28 23 18 18   --   ALKPHOS 155* 129* 116 122*  --   BILITOT 4.5* 5.0* 5.0* 5.1*  --  PROT 7.9 6.7 6.3 6.4  --   ALBUMIN 2.3* 2.0* 1.5* 1.5* 1.5*   No results for input(s): LIPASE, AMYLASE in the last 168 hours. No results for input(s): AMMONIA in the last 168 hours.  CBC:  Recent Labs Lab 12/07/2014 1415  12/10/14 0454 12/11/14 0522 12/12/14 1401 12/13/14 0308 12/14/14 0227  WBC 17.6*  < > 18.3* 18.3* 21.7* 22.8* 27.6*  NEUTROABS 15.7*  --   --   --   --   --   --   HGB 9.7*  < > 9.0* 8.6* 8.9* 9.1* 9.5*  HCT 31.0*  < > 28.0* 26.8* 26.3* 27.8* 28.8*  MCV 88.8  < > 87.2 87.0 81.4 82.7 81.1  PLT 333  < > 246 233 254 241 307  < > = values in this interval not displayed.  Cardiac Enzymes: No results for input(s): CKTOTAL, CKMB, CKMBINDEX, TROPONINI in the last 168 hours.  BNP: BNP (last 3 results) No results for input(s): BNP in the last 8760  hours.  ProBNP (last 3 results)  Recent Labs  08/15/14 0245  PROBNP >70000.0*      Other results:  Imaging: Dg Chest Portable 1 View  12/14/2014   CLINICAL DATA:  CHF, shortness of breath.  EXAM: PORTABLE CHEST - 1 VIEW  COMPARISON:  12/09/2014  FINDINGS: Partially loculated right pleural effusion including an upper paramediastinal component, similar. Small left pleural effusion. Interstitial prominence. Cardiomediastinal contours partially obscured though aligned over those superior large. Central vascular congestion. No pneumothorax. Median sternotomy. Mitral valve replacement. CABG.  IMPRESSION: Enlarged cardiac silhouette with central vascular congestion and similar to mildly increased pulmonary edema pattern.  Right greater than left pleural effusions, loculated on the right, similar to prior.   Electronically Signed   By: Carlos Levering M.D.   On: 12/14/2014 04:16      Medications:     Scheduled Medications: . allopurinol  100 mg Oral Daily  . amiodarone  150 mg Intravenous Once  . antiseptic oral rinse  7 mL Mouth Rinse BID  . darbepoetin (ARANESP) injection - DIALYSIS  100 mcg Intravenous Q Sat-HD  . famotidine  20 mg Oral Daily  . FLUoxetine  20 mg Oral Daily  . insulin aspart  0-5 Units Subcutaneous QHS  . insulin aspart  0-9 Units Subcutaneous TID WC  . metoprolol tartrate  12.5 mg Oral BID  . midodrine  10 mg Oral TID WC  . piperacillin-tazobactam (ZOSYN)  IV  2.25 g Intravenous Q8H  . sodium chloride  3 mL Intravenous Q12H  . traZODone  50 mg Oral QHS  . vancomycin  125 mg Oral QID  . vancomycin  1,000 mg Intravenous Q T,Th,Sa-HD  . Warfarin - Pharmacist Dosing Inpatient   Does not apply q1800     Infusions: . amiodarone 30 mg/hr (12/14/14 0600)     PRN Medications:  sodium chloride, sodium chloride, acetaminophen, feeding supplement (NEPRO CARB STEADY), heparin, HYDROcodone-acetaminophen, lidocaine (PF), lidocaine-prilocaine, metoprolol, ondansetron  **OR** ondansetron (ZOFRAN) IV, pentafluoroprop-tetrafluoroeth   Assessment/Plan    1. Acute Respiratory Failure- On Bipap  2. Hypotension- has history of orthostatic hypotension and was on midodrine 10 mg twice a day at home. Continue midodrine 10 mg tid.     3. Persistent atrial fibrillation/flutter with RVR- Most recent DC-CV 07/2014. Back in A fib on admit. Continue amiodarone dip for rate control. Stop beta blocker with concern for low output. On coumadin per pharmacy. INR 4.43.   2. Cardiomyopathy, LVEF approximately 15% as  of December 2015 at TEE. Does not have ICD. Volume overload noted but getting HD now. Stop BB with concern for low output. No Ace with CKD. Will need central line as he will likely need pressores. Check CO-OX once placed. May need short term inotropes + or - Levo. Repeat ECHO.   3. History of bioprosthetic mitral valve replacement with pericardial valve for severe MR along with CABG x4, left atrial appendage closure and right-sided Maze procedure May of 2008.  4. LLE gangrene/cellulitis . 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 On vanc and zosyn  5. End-stage renal disease on hemodialysis.- Currently tolerating HD. SBP 90-100s  6. Pleural Effusions R> L   Will need to transfer to ICU if Levo added. Will ask CCM to place central line and see if needs thoracentesis.   Length of Stay: 6   CLEGG,AMY NP-C   12/14/2014, 12:36 PM  Advanced Heart Failure Team Pager [phone] (M-F; [place])  Please contact [facility] for night-coverage after hours (4p -7a ) and weekends on [email or web address]  Patient seen with NP, agree with the above note.  Patient with chronic systolic CHF/ischemic cardiomyopathy EF 15%, ESRD, bioprosthetic MV, paroxysmal atrial fibrillation presented with left foot gangrene and afib/RVR + acute/chronic systolic CHF.  He had L 3rd ray amputation.  Currently, volume overloaded and in atrial fibrillation.  SBP upper 80s, which has made HD difficult. Had HD earlier  today, do not have record of how much fluid was taken off.  He is on amiodarone gtt and midodrine.  - Needs to maintain BP at HD for fluid removal.  Would place central access to allow norepinephrine if needed.  Follow CVP and co-ox when central access placed.  Discussed CVL with Dr Thereasa Solo who will contact CCM.  - Continue amiodarone gtt for rate control.  Would consider DCCV (+/- TEE, will need to review INRs) once he is more diuresed.    40 minutes critical care time.   [name] 12/14/2014 4:39 PM

## 2014-12-14 NOTE — Progress Notes (Signed)
I went to check on the patient at this time and patient states he has been off BIPAP since he received Dialysis earlier. Patient is currently on 2 lpm [place] with o2 sats around 98% and all other vitals WNL. RT will continue to assist as needed.

## 2014-12-14 NOTE — Progress Notes (Signed)
Patient ID: [name] [name], male   DOB: September 03, 1950, 64 y.o.   MRN: [id number]  [facility] Progress Note    Subjective:   Feels SOB and having some chest pain   Objective:   BP 81/55 mmHg  Pulse 137  Temp(Src) 97.7 F (36.5 C) (Oral)  Resp 20  Ht 6\' 4"  (1.93 m)  Wt 92.3 kg (203 lb 7.8 oz)  BMI 24.78 kg/m2  SpO2 84%  Intake/Output: I/O last 3 completed shifts: In: 1261.6 [P.O.:460; I.V.:801.6] Out: -    Intake/Output this shift:    Weight change:   Physical Exam: Gen:WD elderly AAM in mild distress QPY:PPJKD and IRR Resp:bibasilar crackles with decreased BS at left base TOI:ZTIWPY Ext:s/p R AKA, tr edema of left leg, s/p amputation of 3rd left toe, right AVF +T/B  Labs: BMET  Recent Labs Lab 12/05/2014 1415 12/09/14 0438 12/11/14 0522 12/12/14 0250 12/02/2014 0308 12/14/14 0227  NA 136 138 138 138 136 139  K 3.8 3.6 3.8 3.9 4.4 4.8  CL 95* 102 100 99 100 102  CO2 26 26 27 24 20 20   GLUCOSE 193* 120* 109* 91 124* 128*  BUN 20 23 17 21 11 22   CREATININE 3.96* 4.49* 4.03* 5.16* 3.35* 4.42*  ALBUMIN 2.3* 2.0*  --  1.5* 1.5* 1.5*  CALCIUM 7.9* 7.8* 7.9* 8.2* 7.8* 8.5  PHOS  --   --  2.5  --   --  3.4   CBC  Recent Labs Lab 12/07/2014 1415  12/11/14 0522 12/12/14 1401 12/19/2014 0308 12/14/14 0227  WBC 17.6*  < > 18.3* 21.7* 22.8* 27.6*  NEUTROABS 15.7*  --   --   --   --   --   HGB 9.7*  < > 8.6* 8.9* 9.1* 9.5*  HCT 31.0*  < > 26.8* 26.3* 27.8* 28.8*  MCV 88.8  < > 87.0 81.4 82.7 81.1  PLT 333  < > 233 254 241 307  < > = values in this interval not displayed.  @IMGRELPRIORS @ Medications:    . allopurinol  100 mg Oral Daily  . amiodarone  150 mg Intravenous Once  . antiseptic oral rinse  7 mL Mouth Rinse BID  . darbepoetin (ARANESP) injection - DIALYSIS  100 mcg Intravenous Q Sat-HD  . famotidine  20 mg Oral Daily  . FLUoxetine  20 mg Oral Daily  . insulin aspart  0-5 Units Subcutaneous QHS  . insulin aspart  0-9 Units Subcutaneous TID  WC  . metoprolol tartrate  12.5 mg Oral BID  . midodrine  10 mg Oral TID WC  . piperacillin-tazobactam (ZOSYN)  IV  2.25 g Intravenous Q8H  . sodium chloride  500 mL Intravenous Once  . sodium chloride  3 mL Intravenous Q12H  . traZODone  50 mg Oral QHS  . vancomycin  125 mg Oral QID  . vancomycin  1,000 mg Intravenous Q T,Th,Sa-HD  . Warfarin - Pharmacist Dosing Inpatient   Does not apply K9983     Assessment/ Plan:   1. A fib with RVR- now complaining of chest pain and hypotensive.  Has developed CHF and is currently too unstable for HD with BP81/59 awaiting cardiology to evaluate patient and help control heart rate so we can attempt HD with UF 2. ESRD- normally TTS but off schedule due to the development of CHF in setting of A fib with RVR 1. Will need better rate control and improvement of BP in order to attempt HD.  Pt with severe  CMP and very sensitive to RVR. 3. SOB/SSCP- as above, new onset A fib with RVR now hypotensive, awaiting cardiology's input to help rate control. 4. 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 Nutrition: protein malnutrition- per primary 7. 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 CMP- EF15% difficulty maintaining BP due to A fib/RVR, will be a challenge for HD.  Poor overall prognosis given multiple end-stage disease processes, may need to consult palliative care to help set goals and limits of care as he is a full code 9. Hypotension: on midodrine but still with low BP due to RVR  Bethaney Oshana A 12/14/2014, 8:34 AM

## 2014-12-15 DIAGNOSIS — I429 Cardiomyopathy, unspecified: Secondary | ICD-10-CM

## 2014-12-15 DIAGNOSIS — I5023 Acute on chronic systolic (congestive) heart failure: Secondary | ICD-10-CM

## 2014-12-15 DIAGNOSIS — Z992 Dependence on renal dialysis: Secondary | ICD-10-CM

## 2014-12-15 DIAGNOSIS — Z953 Presence of xenogenic heart valve: Secondary | ICD-10-CM | POA: Diagnosis present

## 2014-12-15 DIAGNOSIS — A047 Enterocolitis due to Clostridium difficile: Secondary | ICD-10-CM

## 2014-12-15 DIAGNOSIS — I482 Chronic atrial fibrillation, unspecified: Secondary | ICD-10-CM | POA: Diagnosis present

## 2014-12-15 DIAGNOSIS — I483 Typical atrial flutter: Secondary | ICD-10-CM | POA: Diagnosis present

## 2014-12-15 DIAGNOSIS — Z954 Presence of other heart-valve replacement: Secondary | ICD-10-CM

## 2014-12-15 DIAGNOSIS — E1121 Type 2 diabetes mellitus with diabetic nephropathy: Secondary | ICD-10-CM

## 2014-12-15 DIAGNOSIS — A0472 Enterocolitis due to Clostridium difficile, not specified as recurrent: Secondary | ICD-10-CM | POA: Diagnosis present

## 2014-12-15 DIAGNOSIS — N186 End stage renal disease: Secondary | ICD-10-CM | POA: Diagnosis present

## 2014-12-15 LAB — RENAL FUNCTION PANEL
Albumin: 1.7 g/dL — ABNORMAL LOW (ref 3.5–5.2)
Anion gap: 15 (ref 5–15)
BUN: 14 mg/dL (ref 6–23)
CHLORIDE: 99 mmol/L (ref 96–112)
CO2: 27 mmol/L (ref 19–32)
CREATININE: 3.34 mg/dL — AB (ref 0.50–1.35)
Calcium: 8.6 mg/dL (ref 8.4–10.5)
GFR calc Af Amer: 21 mL/min — ABNORMAL LOW (ref >90)
GFR, EST NON AFRICAN AMERICAN: 18 mL/min — AB (ref >90)
Glucose, Bld: 103 mg/dL — ABNORMAL HIGH (ref 70–99)
POTASSIUM: 3.6 mmol/L (ref 3.5–5.1)
Phosphorus: 2.9 mg/dL (ref 2.3–4.6)
SODIUM: 141 mmol/L (ref 135–145)

## 2014-12-15 LAB — CBC
HCT: 28 % — ABNORMAL LOW (ref 39.0–52.0)
Hemoglobin: 9.1 g/dL — ABNORMAL LOW (ref 13.0–17.0)
MCH: 26.2 pg (ref 26.0–34.0)
MCHC: 32.5 g/dL (ref 30.0–36.0)
MCV: 80.7 fL (ref 78.0–100.0)
PLATELETS: 281 10*3/uL (ref 150–400)
RBC: 3.47 MIL/uL — ABNORMAL LOW (ref 4.22–5.81)
RDW: 17.8 % — AB (ref 11.5–15.5)
WBC: 24.1 10*3/uL — AB (ref 4.0–10.5)

## 2014-12-15 LAB — PREPARE FRESH FROZEN PLASMA
UNIT DIVISION: 0
Unit division: 0
Unit division: 0

## 2014-12-15 LAB — GLUCOSE, CAPILLARY
GLUCOSE-CAPILLARY: 138 mg/dL — AB (ref 70–99)
GLUCOSE-CAPILLARY: 118 mg/dL — AB (ref 70–99)
Glucose-Capillary: 88 mg/dL (ref 70–99)
Glucose-Capillary: 114 mg/dL — ABNORMAL HIGH (ref 70–99)

## 2014-12-15 LAB — PROTIME-INR
INR: 2.33 — ABNORMAL HIGH (ref 0.00–1.49)
Prothrombin Time: 25.8 seconds — ABNORMAL HIGH (ref 11.6–15.2)

## 2014-12-15 NOTE — Progress Notes (Signed)
[facility] TEAM 1 - Stepdown/ICU TEAM Progress Note  [name] [name] RCV:[id number] DOB: [birth year] DOA: 12/26/2014 PCP: [name], MD  Admit HPI / Brief Narrative: 64 year old BM PMHx CAD status post CABG 2008 with mitral valve replacement (bioprostetic), diabetes mellitus, atrial fibrillation on Coumadin and amiodarone status post cardioversion in 2015, ESRD Tu/Th/Sat, chronic systolic heart failure with an EF of 15% in 2015, right AKA, and a right chronic loculated pleural effusion. Came to [facility] for left foot pain and was found to have cellulitis and gangrene with severe sepsis (lactic acid of 6).   MRI showed deep ulceration but no evidence of osteo. ABIs showed a heavily diseased popliteal artery with occlusion in the mid segmentand very limited flow within the runoff vessels.  On 04/14 his A. fib RVR became hard to control andhe was started on IV amiodarone and Cardiology was consulted. 04/14 after after dialysis he became hypotensive and had to be given multiple boluses of normal saline. Cardiology and IM deemed him high risk of surgery and he was therefore transferred to [facility].  HPI/Subjective: 4/19 A/O 4, positive SOB which has been increasing over time. States does not use O2 at home  Assessment/Plan:  Left lower extremity cellulitis with gangrene and ulceration / necrotic 3rd toe -Orthopedics following - status post I and D of left foot with third ray amputation - continue empiric antibiotics for now as significant purulent material was encountered during the I&D  - will need eventual more extensive arterial evaluation  Cardiomyopathy / severe chronic systolic congestive heart failure EF 15% December 2015 TEE -Acute decompensation with pulmonary edema due to volume expansion in attempt to treat RVR  -Daily weights;  -Strict I&O; since admission + 5.7 L - not a candidate for ACE inhibitor due to baseline hypertension  - baseline weight~80 kg;  now up 12  kg with pulmonary edema/hypoxia.   -CHF team consulted as the patient will likely require pressor/inotrope support to allow for fluid removal via dialysis  Persistent atrial fibrillation/flutter with RVR  -Remains on amiodarone drip; heart rate proving very difficult to control   Chronic hypotension -Blood pressure with slight improvement, continue monitor closely   End-stage renal disease on hemodialysis - Tu/Th/Sat  -Nephrology following  -HD per nephrology; concerned about proceeding due to his hypotension -Continue Midrin 10 mg TID  CAD Multivessel status post CABG x4 2008 -Cardiology following - not a candidate for beta blockers or nitrates due to baseline hypotension  History of bioprosthetic mitral valve replacement 2008 with pericardial valve for severe MR  -Does not require anticoagulation as is a tissue valve   C. difficile colitis -Continue oral vancomycin as per C. difficile treatment protocol, no evidence at present of acute complications  Type 2 diabetes mellitus with nephropathy -CBGs currently well controlled    Code Status: FULL Family Communication: no family present at time of exam Disposition Plan: CIR vs SNF    Consultants: [facility]  Dr.Christopher [facility]  CHF Team   Procedure/Significant Events: 4/17 I &.D. Lt foot including excision/ debridement of necrotic skin and fascia.- Lt foot third ray amputation.   Culture  Antibiotics: Zosyn 4/12 > Vanc 4/12 > Oral Vanc 4/12 >   DVT prophylaxis: SCDs + warfarin   Devices    LINES / TUBES:      Continuous Infusions: . amiodarone 30 mg/hr (12/15/14 0935)    Objective: VITAL SIGNS: Temp: 98.1 F (36.7 C) (04/19 1919) Temp Source:  Oral (04/19 1919) BP: 107/58 mmHg (04/19 1700) Pulse Rate: 105 (04/19 1700) SPO2; FIO2:   Intake/Output Summary (Last 24 hours) at 12/15/14 1951 Last data filed at 12/15/14  1700  Gross per 24 hour  Intake 1899.3 ml  Output      0 ml  Net 1899.3 ml     Exam: General: A/O 4, mild acute respiratory distress Lungs: Clear to auscultation bilaterally without wheezes or crackles Cardiovascular: Tachycardic, Regular rhythm without murmur gallop or rub normal S1 and S2 Abdomen: Nontender, nondistended, soft, bowel sounds positive, no rebound, no ascites, no appreciable mass Extremities: No significant cyanosis, clubbing. Left foot covered and clean, mild serosanguineous fluid draining, negative purulent drainage; toes cold, blue however patient able to move all toes on command. Right AKA negative ulcerations well-healed scar.  Data Reviewed: Basic Metabolic Panel:  Recent Labs Lab 12/11/14 0522 12/12/14 0250 12/08/2014 0308 12/14/14 0227 12/15/14 0320  NA 138 138 136 139 141  K 3.8 3.9 4.4 4.8 3.6  CL 100 99 100 102 99  CO2 27 24 20 20 27   GLUCOSE 109* 91 124* 128* 103*  BUN 17 21 11 22 14   CREATININE 4.03* 5.16* 3.35* 4.42* 3.34*  CALCIUM 7.9* 8.2* 7.8* 8.5 8.6  PHOS 2.5  --   --  3.4 2.9   Liver Function Tests:  Recent Labs Lab 12/09/14 0438 12/12/14 0250 12/08/2014 0308 12/14/14 0227 12/15/14 0320  AST 43* 38* 47*  --   --   ALT 23 18 18   --   --   ALKPHOS 129* 116 122*  --   --   BILITOT 5.0* 5.0* 5.1*  --   --   PROT 6.7 6.3 6.4  --   --   ALBUMIN 2.0* 1.5* 1.5* 1.5* 1.7*   No results for input(s): LIPASE, AMYLASE in the last 168 hours. No results for input(s): AMMONIA in the last 168 hours. CBC:  Recent Labs Lab 12/11/14 0522 12/12/14 1401 12/08/2014 0308 12/14/14 0227 12/15/14 0320  WBC 18.3* 21.7* 22.8* 27.6* 24.1*  HGB 8.6* 8.9* 9.1* 9.5* 9.1*  HCT 26.8* 26.3* 27.8* 28.8* 28.0*  MCV 87.0 81.4 82.7 81.1 80.7  PLT 233 254 241 307 281   Cardiac Enzymes: No results for input(s): CKTOTAL, CKMB, CKMBINDEX, TROPONINI in the last 168 hours. BNP (last 3 results) No results for input(s): BNP in the last 8760 hours.  ProBNP (last  3 results)  Recent Labs  08/15/14 0245  PROBNP >70000.0*    CBG:  Recent Labs Lab 12/14/14 1724 12/14/14 2118 12/15/14 0833 12/15/14 1301 12/15/14 1659  GLUCAP 93 100* 88 114* 138*    Recent Results (from the past 240 hour(s))  Blood Culture (routine x 2)     Status: None   Collection Time: 12/16/2014  2:48 PM  Result Value Ref Range Status   Specimen Description BLOOD LEFT FOREARM  Final   Special Requests BOTTLES DRAWN AEROBIC ONLY 4CC  Final   Culture NO GROWTH 5 DAYS  Final   Report Status 11/29/2014 FINAL  Final  Blood Culture (routine x 2)     Status: None   Collection Time: 12/12/2014  3:00 PM  Result Value Ref Range Status   Specimen Description BLOOD RIGHT HAND  Final   Special Requests   Final    BOTTLES DRAWN AEROBIC AND ANAEROBIC AEB=6CC ANA=4CC   Culture NO GROWTH 5 DAYS  Final   Report Status 12/12/2014 FINAL  Final  MRSA PCR Screening     Status:  None   Collection Time: 12/19/2014  6:40 PM  Result Value Ref Range Status   MRSA by PCR NEGATIVE NEGATIVE Final    Comment:        The GeneXpert MRSA Assay (FDA approved for NASAL specimens only), is one component of a comprehensive MRSA colonization surveillance program. It is not intended to diagnose MRSA infection nor to guide or monitor treatment for MRSA infections.   Clostridium Difficile by PCR     Status: Abnormal   Collection Time: 12/11/14 10:12 AM  Result Value Ref Range Status   C difficile by pcr POSITIVE (A) NEGATIVE Final    Comment: CRITICAL RESULT CALLED TO, READ BACK BY AND VERIFIED WITH: S.HEATH AT 9470 ON 12/11/14 BY S.VANHOORNE      Studies:  Recent x-ray studies have been reviewed in detail by the Attending Physician  Scheduled Meds:  Scheduled Meds: . allopurinol  100 mg Oral Daily  . amiodarone  150 mg Intravenous Once  . antiseptic oral rinse  7 mL Mouth Rinse BID  . darbepoetin (ARANESP) injection - DIALYSIS  100 mcg Intravenous Q Sat-HD  . famotidine  20 mg Oral Daily    . FLUoxetine  20 mg Oral Daily  . insulin aspart  0-5 Units Subcutaneous QHS  . insulin aspart  0-9 Units Subcutaneous TID WC  . midodrine  10 mg Oral TID WC  . piperacillin-tazobactam (ZOSYN)  IV  2.25 g Intravenous Q8H  . sodium chloride  3 mL Intravenous Q12H  . traZODone  50 mg Oral QHS  . vancomycin  125 mg Oral QID  . vancomycin  1,000 mg Intravenous Q T,Th,Sa-HD  . vancomycin  750 mg Intravenous Once    Time spent on care of this patient: 40 mins   Hansini Clodfelter, Geraldo Docker , MD  [facility] Office  [phone] Pager [phone]  On-Call/Text Page:      [email or web address]      password [email or web address]  If 7PM-7AM, please contact night-coverage [email or web address] Password [email or web address] 12/15/2014, 7:51 PM   LOS: 7 days   Care during the described time interval was provided by me .  I have reviewed this patient's available data, including medical history, events of note, physical examination, radiology studies and test results as part of my evaluation  Dia Crawford, MD [phone] Pager

## 2014-12-15 NOTE — Progress Notes (Signed)
Placed pt on Bipap due to slight increased WOB. RN aware. RT will continue to monitor.

## 2014-12-15 NOTE — Progress Notes (Signed)
Patient ID: [name] [name], male   DOB: 1950-12-05, 64 y.o.   MRN: [id number]  [facility] Progress Note    Subjective:   Feels a little better today   Objective:   BP 108/59 mmHg  Pulse 94  Temp(Src) 97.3 F (36.3 C) (Oral)  Resp 27  Ht 6\' 4"  (1.93 m)  Wt 93 kg (205 lb 0.4 oz)  BMI 24.97 kg/m2  SpO2 100%  Intake/Output: I/O last 3 completed shifts: In: 2452.8 [P.O.:180; I.V.:854.8; IONGE:952; IV Piggyback:750] Out: 2900 [Other:2900]   Intake/Output this shift:    Weight change:   Physical Exam: Gen:WD WN AAM in NAD CVS:IRR IRRno rub Resp:CTA WUX:LKGMWN Ext:tr edema, R AVF +T/B  Labs: BMET  Recent Labs Lab 12/07/2014 1415 12/09/14 0438 12/11/14 0522 12/12/14 0250 12/07/2014 0308 12/14/14 0227 12/15/14 0320  NA 136 138 138 138 136 139 141  K 3.8 3.6 3.8 3.9 4.4 4.8 3.6  CL 95* 102 100 99 100 102 99  CO2 26 26 27 24 20 20 27   GLUCOSE 193* 120* 109* 91 124* 128* 103*  BUN 20 23 17 21 11 22 14   CREATININE 3.96* 4.49* 4.03* 5.16* 3.35* 4.42* 3.34*  ALBUMIN 2.3* 2.0*  --  1.5* 1.5* 1.5* 1.7*  CALCIUM 7.9* 7.8* 7.9* 8.2* 7.8* 8.5 8.6  PHOS  --   --  2.5  --   --  3.4 2.9   CBC  Recent Labs Lab 12/21/2014 1415  12/12/14 1401 12/19/2014 0308 12/14/14 0227 12/15/14 0320  WBC 17.6*  < > 21.7* 22.8* 27.6* 24.1*  NEUTROABS 15.7*  --   --   --   --   --   HGB 9.7*  < > 8.9* 9.1* 9.5* 9.1*  HCT 31.0*  < > 26.3* 27.8* 28.8* 28.0*  MCV 88.8  < > 81.4 82.7 81.1 80.7  PLT 333  < > 254 241 307 281  < > = values in this interval not displayed.  @[email or web address] @ Medications:    . allopurinol  100 mg Oral Daily  . amiodarone  150 mg Intravenous Once  . antiseptic oral rinse  7 mL Mouth Rinse BID  . darbepoetin (ARANESP) injection - DIALYSIS  100 mcg Intravenous Q Sat-HD  . famotidine  20 mg Oral Daily  . FLUoxetine  20 mg Oral Daily  . insulin aspart  0-5 Units Subcutaneous QHS  . insulin aspart  0-9 Units Subcutaneous TID WC  . midodrine  10 mg Oral  TID WC  . piperacillin-tazobactam (ZOSYN)  IV  2.25 g Intravenous Q8H  . sodium chloride  3 mL Intravenous Q12H  . traZODone  50 mg Oral QHS  . vancomycin  125 mg Oral QID  . vancomycin  1,000 mg Intravenous Q T,Th,Sa-HD  . vancomycin  750 mg Intravenous Once     Assessment/ Plan:   1. A fib with RVR- improved with IV amiodarone, appreciate cards input.  Will cont with UF as tolerated with HD. 2. ESRD- normally TTS but off schedule due to the development of CHF in setting of A fib with RVR 1. Pt with severe CMP and very sensitive to RVR but did tolerate UF of 2.5l yesterday.  Plan for HD tomorrow and will get back on schedule later this week. 3. SOB/SSCP- as above, new onset A fib with RVR now hypotensive, awaiting cardiology's input to help rate control. 4. 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 Nutrition: protein malnutrition- per primary 7. 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 CMP- EF15% difficulty maintaining BP due to A fib/RVR, will be a challenge for HD. Poor overall prognosis given multiple end-stage disease processes, may need to consult palliative care to help set goals and limits of care as he is a full code 9. Hypotension: on midodrine but still with low BP due to RVR  Ruvim Risko A 12/15/2014, 11:34 AM

## 2014-12-15 NOTE — Progress Notes (Signed)
Patient ID: [name] [name], male   DOB: 08-16-1951, 64 y.o.   MRN: [id number] Advanced Heart Failure Rounding Note   Subjective:    [name] [name] is a 64 year old man with a history of  64 y/o M CAD s/p CABG in 2008 with bioprosthetic mitral valve replacement at that time, ESRD on HD, prior GI bleed, HTN, HLD, PAF on oral amiodarone/Coumadin (s/p TEE/DCCV in 24/4628), chronic systolic dysfunction (last EF 15% in 07/2014), anemia, R AKA, chronic right loculated pleural effusion s/p chest tube 10/2014 .   Admitted to [facility] with necrotic L foot and A fib RVR. Placed on antibiotics.  He was started on cardizem that was later stopped and he was placed on amiodarone drip after he was evaluated by cardiology.  He transferred to [facility] on April 18 due to increased surgical risk.  BB stopped with hypotension.   He went to OR on 4/17 and had 3rd ray amputation. Over night he developed respiratory distress and placed on increased oxygen and now on BiPap.  CXR with Increased pulmonary edema and R and L pleural effusions --R>L. Hypotensive with SBP in 80s initially.    Today, he seems more stable.  SBP > 100, no pressors.  Does not yet have CVL.  Not short of breath at rest.  Remains in atrial fibrillation on amiodarone gtt.   Objective:   Weight Range:  Vital Signs:   Temp:  [97.5 F (36.4 C)-98.3 F (36.8 C)] 97.9 F (36.6 C) (04/19 0345) Pulse Rate:  [43-109] 94 (04/19 0834) Resp:  [16-29] 27 (04/19 0834) BP: (80-110)/(32-82) 108/59 mmHg (04/19 0834) SpO2:  [89 %-100 %] 100 % (04/19 0834) Weight:  [205 lb 0.4 oz (93 kg)-212 lb 15.4 oz (96.6 kg)] 205 lb 0.4 oz (93 kg) (04/18 1533) Last BM Date: 12/14/14  Weight change: Filed Weights   12/12/14 1740 12/14/14 1133 12/14/14 1533  Weight: 203 lb 7.8 oz (92.3 kg) 212 lb 15.4 oz (96.6 kg) 205 lb 0.4 oz (93 kg)    Intake/Output:   Intake/Output Summary (Last 24 hours) at 12/15/14 0920 Last data filed at 12/15/14 0351  Gross per 24 hour  Intake   1872 ml   Output   2900 ml  Net  -1028 ml     Physical Exam: General:  Chronically ill appearing. On BiPAP HEENT: normal Neck: supple. JVP to jaw.  Carotids 2+ bilat; no bruits. No lymphadenopathy or thryomegaly appreciated. Cor: PMI nondisplaced. Regular rate & rhythm. No rubs, gallops or murmurs. Lungs: Decreased breath sounds RLL LLL Abdomen: soft, nontender, nondistended. No hepatosplenomegaly. No bruits or masses. Good bowel sounds. Extremities: no cyanosis, clubbing, rash, edema R AKA LLE foot dressing in tact Neuro: drowsy but does arouse. On Bipap. Has R AKA. moves all 4 extremities w/o difficulty. Affect pleasant  Telemetry:  A Fib 100s  Labs: Basic Metabolic Panel:  Recent Labs Lab 12/11/14 0522 12/12/14 0250 12/09/2014 0308 12/14/14 0227 12/15/14 0320  NA 138 138 136 139 141  K 3.8 3.9 4.4 4.8 3.6  CL 100 99 100 102 99  CO2 27 24 20 20 27   GLUCOSE 109* 91 124* 128* 103*  BUN 17 21 11 22 14   CREATININE 4.03* 5.16* 3.35* 4.42* 3.34*  CALCIUM 7.9* 8.2* 7.8* 8.5 8.6  PHOS 2.5  --   --  3.4 2.9    Liver Function Tests:  Recent Labs Lab 12/20/2014 1415 12/09/14 0438 12/12/14 0250 12/07/2014 0308 12/14/14 0227 12/15/14 0320  AST 57* 43* 38*  47*  --   --   ALT 28 23 18 18   --   --   ALKPHOS 155* 129* 116 122*  --   --   BILITOT 4.5* 5.0* 5.0* 5.1*  --   --   PROT 7.9 6.7 6.3 6.4  --   --   ALBUMIN 2.3* 2.0* 1.5* 1.5* 1.5* 1.7*   No results for input(s): LIPASE, AMYLASE in the last 168 hours. No results for input(s): AMMONIA in the last 168 hours.  CBC:  Recent Labs Lab 12/17/2014 1415  12/11/14 0522 12/12/14 1401 12/13/14 0308 12/14/14 0227 12/15/14 0320  WBC 17.6*  < > 18.3* 21.7* 22.8* 27.6* 24.1*  NEUTROABS 15.7*  --   --   --   --   --   --   HGB 9.7*  < > 8.6* 8.9* 9.1* 9.5* 9.1*  HCT 31.0*  < > 26.8* 26.3* 27.8* 28.8* 28.0*  MCV 88.8  < > 87.0 81.4 82.7 81.1 80.7  PLT 333  < > 233 254 241 307 281  < > = values in this interval not  displayed.  Cardiac Enzymes: No results for input(s): CKTOTAL, CKMB, CKMBINDEX, TROPONINI in the last 168 hours.  BNP: BNP (last 3 results) No results for input(s): BNP in the last 8760 hours.  ProBNP (last 3 results)  Recent Labs  08/15/14 0245  PROBNP >70000.0*      Other results:  Imaging: Dg Chest Portable 1 View  12/14/2014   CLINICAL DATA:  CHF, shortness of breath.  EXAM: PORTABLE CHEST - 1 VIEW  COMPARISON:  12/22/2014  FINDINGS: Partially loculated right pleural effusion including an upper paramediastinal component, similar. Small left pleural effusion. Interstitial prominence. Cardiomediastinal contours partially obscured though aligned over those superior large. Central vascular congestion. No pneumothorax. Median sternotomy. Mitral valve replacement. CABG.  IMPRESSION: Enlarged cardiac silhouette with central vascular congestion and similar to mildly increased pulmonary edema pattern.  Right greater than left pleural effusions, loculated on the right, similar to prior.   Electronically Signed   By: Carlos Levering M.D.   On: 12/14/2014 04:16     Medications:     Scheduled Medications: . allopurinol  100 mg Oral Daily  . amiodarone  150 mg Intravenous Once  . antiseptic oral rinse  7 mL Mouth Rinse BID  . darbepoetin (ARANESP) injection - DIALYSIS  100 mcg Intravenous Q Sat-HD  . famotidine  20 mg Oral Daily  . FLUoxetine  20 mg Oral Daily  . insulin aspart  0-5 Units Subcutaneous QHS  . insulin aspart  0-9 Units Subcutaneous TID WC  . midodrine  10 mg Oral TID WC  . piperacillin-tazobactam (ZOSYN)  IV  2.25 g Intravenous Q8H  . sodium chloride  3 mL Intravenous Q12H  . traZODone  50 mg Oral QHS  . vancomycin  125 mg Oral QID  . vancomycin  1,000 mg Intravenous Q T,Th,Sa-HD  . vancomycin  750 mg Intravenous Once    Infusions: . amiodarone Stopped (12/14/14 1400)    PRN Medications: sodium chloride, sodium chloride, acetaminophen, feeding supplement  (NEPRO CARB STEADY), heparin, heparin, HYDROcodone-acetaminophen, lidocaine (PF), lidocaine-prilocaine, metoprolol, ondansetron **OR** ondansetron (ZOFRAN) IV, pentafluoroprop-tetrafluoroeth   Assessment/Plan    1. Acute Respiratory Failure: Pulmonary edema with acute/chronic systolic CHF.  Now on nasal cannula.  2. Hypotension: Has history of orthostatic hypotension in setting of ischemic cardiomyopathy and was on midodrine 10 mg twice a day at home. He has had trouble tolerating cardiac meds historically.  Suspect hypotension in the hospital may have been combination of cardiogenic and septic/vasodilatory shock.  BP now better, SBP > 100. - No need for pressors at this point, but will need to follow closely as he needs significant fluid off via HD.  - Some degree of hypotension has been a [place]-standing problem for [name] Cullinan.  Would continue midodrine at home dose.     3. Persistent atrial fibrillation with mild RVR: Most recent DC-CV 07/2014. Back in atrial fibrillation this admission. Continue amiodarone dip for rate control. Stopped beta blocker with concern for low output and low BP. On coumadin per pharmacy. INR was reversed for possible CVL placement.  - Would continue amiodarone gtt and warfarin.  When he has had more fluid removed (more euvolemic), would be reasonable to re-attempt DCCV (will likely need TEE guidance as I am not sure that INR has been persistently therapeutic).  2. Acute on chronic systolic CHF:  Ischemic cardiomyopathy, he has tolerated minimal cardiac meds at home.  LVEF approximately 15% as of December 2015 at TEE. Does not have ICD (suspect poor candidate given infection risk).  He remains volume overloaded on exam.  - Think he would benefit from daily HD for volume removal. Right now, think that his BP should tolerate (currently 120/50).  - If CVL placed, would get co-ox.  3. History of bioprosthetic mitral valve replacement for severe [name]: Reassess MV by echo.  4. CAD: s/p  CABG.  Stable, no chest pain.  Can restart home atorvastatin 20 daily.  5. 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 On vanc and zosyn 6. End-stage renal disease on hemodialysis: As above, needs fluid removal via HD.  7. Pleural Effusions R> L  Length of Stay: [place]  12/15/2014, 9:20 AM  Advanced Heart Failure Team Pager [phone] (M-F; 7a - 4p)  Please contact [facility] for night-coverage after hours (4p -7a ) and weekends on [email or web address]

## 2014-12-16 LAB — GLUCOSE, CAPILLARY
GLUCOSE-CAPILLARY: 115 mg/dL — AB (ref 70–99)
GLUCOSE-CAPILLARY: 90 mg/dL (ref 70–99)
Glucose-Capillary: 117 mg/dL — ABNORMAL HIGH (ref 70–99)
Glucose-Capillary: 134 mg/dL — ABNORMAL HIGH (ref 70–99)

## 2014-12-16 LAB — RENAL FUNCTION PANEL
ANION GAP: 14 (ref 5–15)
Albumin: 1.5 g/dL — ABNORMAL LOW (ref 3.5–5.2)
BUN: 23 mg/dL (ref 6–23)
CHLORIDE: 97 mmol/L (ref 96–112)
CO2: 26 mmol/L (ref 19–32)
Calcium: 8.5 mg/dL (ref 8.4–10.5)
Creatinine, Ser: 4.81 mg/dL — ABNORMAL HIGH (ref 0.50–1.35)
GFR calc Af Amer: 13 mL/min — ABNORMAL LOW (ref >90)
GFR, EST NON AFRICAN AMERICAN: 12 mL/min — AB (ref >90)
Glucose, Bld: 109 mg/dL — ABNORMAL HIGH (ref 70–99)
POTASSIUM: 4.2 mmol/L (ref 3.5–5.1)
Phosphorus: 2.9 mg/dL (ref 2.3–4.6)
SODIUM: 137 mmol/L (ref 135–145)

## 2014-12-16 LAB — PROTIME-INR
INR: 1.53 — ABNORMAL HIGH (ref 0.00–1.49)
Prothrombin Time: 18.6 seconds — ABNORMAL HIGH (ref 11.6–15.2)

## 2014-12-16 MED ORDER — VANCOMYCIN HCL IN DEXTROSE 750-5 MG/150ML-% IV SOLN
750.0000 mg | Freq: Once | INTRAVENOUS | Status: AC
  Administered 2014-12-16: 750 mg via INTRAVENOUS
  Filled 2014-12-16: qty 150

## 2014-12-16 MED ORDER — WARFARIN - PHARMACIST DOSING INPATIENT
Freq: Every day | Status: DC
  Administered 2014-12-16 – 2014-12-18 (×3)

## 2014-12-16 MED ORDER — SODIUM CHLORIDE 0.9 % IJ SOLN: 3.0000 mL | INTRAMUSCULAR | Status: DC | PRN

## 2014-12-16 MED ORDER — SODIUM CHLORIDE 0.9 % IV SOLN: 250.0000 mL | INTRAVENOUS | Status: DC

## 2014-12-16 MED ORDER — WARFARIN 0.5 MG HALF TABLET
0.5000 mg | ORAL_TABLET | Freq: Once | ORAL | Status: AC
  Administered 2014-12-16: 0.5 mg via ORAL
  Filled 2014-12-16: qty 1

## 2014-12-16 MED ORDER — ATORVASTATIN CALCIUM 20 MG PO TABS
20.0000 mg | ORAL_TABLET | Freq: Every day | ORAL | Status: DC
  Administered 2014-12-16 – 2014-12-22 (×7): 20 mg via ORAL
  Filled 2014-12-16 (×8): qty 1

## 2014-12-16 MED ORDER — SODIUM CHLORIDE 0.9 % IJ SOLN: 3.0000 mL | Freq: Two times a day (BID) | INTRAMUSCULAR | Status: DC

## 2014-12-16 MED ORDER — HEPARIN SODIUM (PORCINE) 1000 UNIT/ML DIALYSIS
20.0000 [IU]/kg | INTRAMUSCULAR | Status: DC | PRN
  Filled 2014-12-16: qty 2

## 2014-12-16 MED ORDER — HEPARIN (PORCINE) IN NACL 100-0.45 UNIT/ML-% IJ SOLN
1200.0000 [IU]/h | INTRAMUSCULAR | Status: DC
  Administered 2014-12-16: 1200 [IU]/h via INTRAVENOUS
  Filled 2014-12-16 (×2): qty 250

## 2014-12-16 NOTE — Progress Notes (Signed)
[facility] for Warfarin, heparin Indication: atrial fibrillation  Allergies  Allergen Reactions  . Bacitracin Other (See Comments)    unknown  . Norvasc [Amlodipine Besylate] Other (See Comments)    unknown    Patient Measurements: Height: 6\' 4"  (193 cm) Weight: 195 lb 15.8 oz (88.9 kg) IBW/kg (Calculated) : 86.8   Vital Signs: Temp: 97.5 F (36.4 C) (04/20 1150) Temp Source: Axillary (04/20 1150) BP: 99/71 mmHg (04/20 1150) Pulse Rate: 94 (04/20 1150)  Labs:  Recent Labs  12/14/14 0227 12/15/14 0320 12/16/14 0305 12/16/14 0841  HGB 9.5* 9.1*  --   --   HCT 28.8* 28.0*  --   --   PLT 307 281  --   --   LABPROT 42.5* 25.8* 18.6*  --   INR 4.43* 2.33* 1.53*  --   CREATININE 4.42* 3.34*  --  4.81*    Estimated Creatinine Clearance: 18.8 mL/min (by C-G formula based on Cr of 4.81).   Medical History: Past Medical History  Diagnosis Date  . UGI bleed 02/12/11    On anticoagulation; EGD w/snare polypectomy-multiple polypoid lesions antrum(benign), Chronic active gastritis (NEGATIVE H pylori)  . Anemia, chronic disease   . Sepsis due to enterococcus 02/11/11  . Paroxysmal atrial fibrillation     On coumadin  . Coronary atherosclerosis of native coronary artery     a. Multivessel status post CABG 2008.  . Cardiomyopathy   . Type 2 diabetes mellitus   . Essential hypertension, benign   . Hyperlipemia   . Gout   . S/P patent foramen ovale closure   . Hyperbilirubinemia 08/10/2013  . SBO (small bowel obstruction) 01/2014  . 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 Clostridium difficile colitis 01/2014  . Diverticulosis   . Internal hemorrhoids   . ESRD on hemodialysis     Started 2008-2009. Dr Hinda Lenis on a TTS schedule - R forearm AVF for several years    . Orthostatic hypotension   . S/P mitral valve replacement with bioprosthetic valve     a. 2008 at time of CABG.  . Chronic systolic CHF (congestive heart failure)     a.  Last EF 15% in 07/2014.  Marland Kitchen Loculated pleural effusion     a. chronic right loculated pleural effusion s/p chest tube 10/2014.  Marland Kitchen RBBB     a. Intermittent.     Medications:  Scheduled:  . allopurinol  100 mg Oral Daily  . amiodarone  150 mg Intravenous Once  . antiseptic oral rinse  7 mL Mouth Rinse BID  . darbepoetin (ARANESP) injection - DIALYSIS  100 mcg Intravenous Q Sat-HD  . famotidine  20 mg Oral Daily  . FLUoxetine  20 mg Oral Daily  . insulin aspart  0-5 Units Subcutaneous QHS  . insulin aspart  0-9 Units Subcutaneous TID WC  . midodrine  10 mg Oral TID WC  . piperacillin-tazobactam (ZOSYN)  IV  2.25 g Intravenous Q8H  . sodium chloride  3 mL Intravenous Q12H  . traZODone  50 mg Oral QHS  . vancomycin  125 mg Oral QID  . vancomycin  1,000 mg Intravenous Q T,Th,Sa-HD  . vancomycin  750 mg Intravenous Once  . vancomycin  750 mg Intravenous Once    Assessment: 17 yoM on coumadin PTA for hx Afib. Coumadin was held since 4/15 pending surgery. S/P surgery on 4/17. Pharmacy consulted to restart coumadin but no doses given as INR was elevated.  Vitamin K  10 mg IV given 4/19.  INR today 1.53  Coumadin to resume today with heparin bridge. Reviewing previous [facility] from [facility], it appears his INR fluctuates and is very sensitive to coumadin.  Documented Home Dose: 0.5 mg daily except on Sunday and Wednesday, no coumadin.  Goal of Therapy:  Heparin level 0.3-0.7 units/ml INR 2-3 Monitor platelets by anticoagulation protocol: Yes   Plan:  -Start heparin drip with no bolus at 1200 units/hr -Check HL 8 hours after start -Coumadin 0.5 mg po today -Daily PT/INR -Monitor for signs of bleeding  Thanks for allowing pharmacy to be a part of this patient's care.  Excell Seltzer, PharmD Clinical Pharmacist, [phone] 4/20/201612:11 PM

## 2014-12-16 NOTE — Progress Notes (Signed)
Patient ID: [name] [name], male   DOB: 08-06-1951, 64 y.o.   MRN: [id number] I changed his left foot dressing at the bedside.  I am concerned that his 4th toe is becoming necrotic as well.  I will need to have my partner Dr. Sharol Given come by and evaluate his left foot and assess the need for further surgery.  I did talk with [name] about this as well.

## 2014-12-16 NOTE — Progress Notes (Signed)
Patient ID: [name] [name], male   DOB: 17-Aug-1951, 64 y.o.   MRN: [id number] Advanced Heart Failure Rounding Note   Subjective:    [name] [name] is a 64 year old man with a history of  64 y/o M CAD s/p CABG in 2008 with bioprosthetic mitral valve replacement at that time, ESRD on HD, prior GI bleed, HTN, HLD, PAF on oral amiodarone/Coumadin (s/p TEE/DCCV in 82/4235), chronic systolic dysfunction (last EF 15% in 07/2014), anemia, R AKA, chronic right loculated pleural effusion s/p chest tube 10/2014 .   Admitted to [facility] with necrotic L foot and A fib RVR. Placed on antibiotics.  He was started on cardizem that was later stopped and he was placed on amiodarone drip after he was evaluated by cardiology.  He transferred to [facility] on April 18 due to increased surgical risk.  BB stopped with hypotension.   He went to OR on 4/17 and had 3rd ray amputation. Over night he developed respiratory distress and placed on increased oxygen and now on BiPap.  CXR with Increased pulmonary edema and R and L pleural effusions --R>L. Hypotensive with SBP in 80s initially.    Stable today, SBP 90s-100s, no pressors.  Not short of breath at rest.  Remains in atrial fibrillation on amiodarone gtt, HR 90s-100s.  HD done today, tolerated ok with 6 lb weight loss.   Objective:   Weight Range:  Vital Signs:   Temp:  [97.5 F (36.4 C)-98.1 F (36.7 C)] 97.9 F (36.6 C) (04/20 1200) Pulse Rate:  [25-110] 94 (04/20 1200) Resp:  [15-34] 22 (04/20 1200) BP: (83-115)/(50-84) 92/72 mmHg (04/20 1200) SpO2:  [94 %-99 %] 95 % (04/20 1200) FiO2 (%):  [40 %] 40 % (04/20 0802) Weight:  [195 lb 15.8 oz (88.9 kg)-201 lb 11.5 oz (91.5 kg)] 195 lb 15.8 oz (88.9 kg) (04/20 1150) Last BM Date: 12/14/14  Weight change: Filed Weights   12/14/14 1533 12/16/14 0500 12/16/14 1150  Weight: 205 lb 0.4 oz (93 kg) 201 lb 11.5 oz (91.5 kg) 195 lb 15.8 oz (88.9 kg)    Intake/Output:   Intake/Output Summary (Last 24 hours) at 12/16/14 1236 Last data  filed at 12/16/14 1200  Gross per 24 hour  Intake 1032.3 ml  Output   2659 ml  Net -1626.7 ml     Physical Exam: General:  Chronically ill appearing. On BiPAP HEENT: normal Neck: supple. JVP 10 cm.  Carotids 2+ bilat; no bruits. No lymphadenopathy or thryomegaly appreciated. Cor: PMI nondisplaced. Regular rate & rhythm. No rubs, gallops or murmurs. Lungs: Decreased breath sounds RLL LLL Abdomen: soft, nontender, nondistended. No hepatosplenomegaly. No bruits or masses. Good bowel sounds. Extremities: no cyanosis, clubbing, rash, edema R AKA LLE foot dressing in tact Neuro: drowsy but does arouse. On Bipap. Has R AKA. moves all 4 extremities w/o difficulty. Affect pleasant  Telemetry:  A Fib 90s-100s  Labs: Basic Metabolic Panel:  Recent Labs Lab 12/11/14 0522 12/12/14 0250 12/16/2014 0308 12/14/14 0227 12/15/14 0320 12/16/14 0841  NA 138 138 136 139 141 137  K 3.8 3.9 4.4 4.8 3.6 4.2  CL 100 99 100 102 99 97  CO2 27 24 20 20 27 26   GLUCOSE 109* 91 124* 128* 103* 109*  BUN 17 21 11 22 14 23   CREATININE 4.03* 5.16* 3.35* 4.42* 3.34* 4.81*  CALCIUM 7.9* 8.2* 7.8* 8.5 8.6 8.5  PHOS 2.5  --   --  3.4 2.9 2.9    Liver Function Tests:  Recent  Labs Lab 12/12/14 0250 12/02/2014 0308 12/14/14 0227 12/15/14 0320 12/16/14 0841  AST 38* 47*  --   --   --   ALT 18 18  --   --   --   ALKPHOS 116 122*  --   --   --   BILITOT 5.0* 5.1*  --   --   --   PROT 6.3 6.4  --   --   --   ALBUMIN 1.5* 1.5* 1.5* 1.7* 1.5*   No results for input(s): LIPASE, AMYLASE in the last 168 hours. No results for input(s): AMMONIA in the last 168 hours.  CBC:  Recent Labs Lab 12/11/14 0522 12/12/14 1401 12/26/2014 0308 12/14/14 0227 12/15/14 0320  WBC 18.3* 21.7* 22.8* 27.6* 24.1*  HGB 8.6* 8.9* 9.1* 9.5* 9.1*  HCT 26.8* 26.3* 27.8* 28.8* 28.0*  MCV 87.0 81.4 82.7 81.1 80.7  PLT 233 254 241 307 281    Cardiac Enzymes: No results for input(s): CKTOTAL, CKMB, CKMBINDEX, TROPONINI in the  last 168 hours.  BNP: BNP (last 3 results) No results for input(s): BNP in the last 8760 hours.  ProBNP (last 3 results)  Recent Labs  08/15/14 0245  PROBNP >70000.0*      Other results:  Imaging: No results found.   Medications:     Scheduled Medications: . allopurinol  100 mg Oral Daily  . amiodarone  150 mg Intravenous Once  . antiseptic oral rinse  7 mL Mouth Rinse BID  . darbepoetin (ARANESP) injection - DIALYSIS  100 mcg Intravenous Q Sat-HD  . famotidine  20 mg Oral Daily  . FLUoxetine  20 mg Oral Daily  . insulin aspart  0-5 Units Subcutaneous QHS  . insulin aspart  0-9 Units Subcutaneous TID WC  . midodrine  10 mg Oral TID WC  . piperacillin-tazobactam (ZOSYN)  IV  2.25 g Intravenous Q8H  . sodium chloride  3 mL Intravenous Q12H  . traZODone  50 mg Oral QHS  . vancomycin  125 mg Oral QID  . vancomycin  1,000 mg Intravenous Q T,Th,Sa-HD  . vancomycin  750 mg Intravenous Once  . vancomycin  750 mg Intravenous Once  . warfarin  0.5 mg Oral ONCE-1800  . Warfarin - Pharmacist Dosing Inpatient   Does not apply q1800    Infusions: . amiodarone 30 mg/hr (12/16/14 0953)  . heparin      PRN Medications: sodium chloride, sodium chloride, acetaminophen, feeding supplement (NEPRO CARB STEADY), heparin, heparin, HYDROcodone-acetaminophen, lidocaine (PF), lidocaine-prilocaine, metoprolol, ondansetron **OR** ondansetron (ZOFRAN) IV, pentafluoroprop-tetrafluoroeth   Assessment/Plan    1. Acute Respiratory Failure: Pulmonary edema with acute/chronic systolic CHF.  Now on nasal cannula.  2. Hypotension: Has history of orthostatic hypotension in setting of ischemic cardiomyopathy and was on midodrine 10 mg twice a day at home. He has had trouble tolerating cardiac meds historically.  Suspect hypotension in the hospital may have been combination of cardiogenic and septic/vasodilatory shock.  BP now better, SBP 90s-100s. - No need for pressors at this point.  - Some  degree of hypotension has been a [place]-standing problem for [name] [name].  Would continue midodrine at home dose.     3. Persistent atrial fibrillation with mild RVR: Most recent DC-CV 07/2014. Back in atrial fibrillation this admission. Continue amiodarone drip for rate control. Stopped beta blocker with concern for low output and low BP.  INR 1.5 today, start heparin gtt and coumadin per pharmacy.  Now that he has had a fair amount of fluid  off via HD, will arrange for TEE-guided DCCV.  Will try for tomorrow but uncertain if can fit in schedule, may be Friday.  2. Acute on chronic systolic CHF:  Ischemic cardiomyopathy, he has tolerated minimal cardiac meds at home.  LVEF approximately 15% as of December 2015 at TEE. Does not have ICD (suspect poor candidate given infection risk).  He remains volume overloaded on exam but getting better with HD.  - Think he would benefit from daily HD for volume removal. Right now, think that his BP should tolerate.  3. History of bioprosthetic mitral valve replacement for severe [name]: Reassess MV by echo.  4. CAD: s/p CABG.  Stable, no chest pain.  Can restart home atorvastatin 20 daily.  5. 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 On vanc and zosyn 6. End-stage renal disease on hemodialysis: As above, needs fluid removal via HD.  7. Pleural Effusions R> L  Length of Stay: 8   [name]  12/16/2014, 12:36 PM  Advanced Heart Failure Team Pager [phone] (M-F; 7a - 4p)  Please contact [facility] for night-coverage after hours (4p -7a ) and weekends on [email or web address]

## 2014-12-16 NOTE — Progress Notes (Signed)
TEAM 1 - Stepdown/ICU TEAM Progress Note  [name] [name] ZJI:[id number] DOB: 05/03/1951 DOA: 12/03/2014 PCP: [name], MD  Admit HPI / Brief Narrative: 64 year old male with history of CAD status post CABG 2008 with mitral valve replacement (bioprostetic), diabetes mellitus, atrial fibrillation on Coumadin and amiodarone status post cardioversion in 2015, ESRD, chronic systolic heart failure with an EF of 15% in 2015, right AKA, and a right chronic loculated pleural effusion who came to [facility] for left foot pain and was found to have cellulitis and gangrene with severe sepsis (lactic acid of 6).   MRI showed deep ulceration but no evidence of osteo.  ABIs showed a heavily diseased popliteal artery with occlusion in the mid segmentand very limited flow within the runoff vessels.  On 04/14 his RVR became hard to control andhe was started on IV amiodarone and Cardiology was consulted.  04/14 after after dialysis he became hypotensive and had to be given multiple boluses of normal saline. Cardiology and IM deemed him high risk of surgery and he was therefore transferred to [facility].  HPI/Subjective: The patient is dramatically improved today.  He is alert oriented and conversant.  He denies any complaints at present time other than a poor appetite.  He denies chest pain shortness of breath or abdominal pain.  Assessment/Plan:  Left lower extremity cellulitis with gangrene and ulceration / necrotic 3rd toe Orthopedics following - status post I and D of left foot with third ray amputation - continue empiric antibiotics - will need more extensive arterial evaluation when more stable clinically  Cardiomyopathy / severe chronic systolic congestive heart failure EF 15% December 2015 TEE Acute decompensation with pulmonary edema due to volume expansion in attempt to treat RVR - follow daily weights and strict Is - not a candidate for ACE inhibitor due to baseline hypotension -  baseline weight appears to be approximately 80 kg - now up 9 kg - CHF team following but patient appears to be stabilizing today and in much less distress  Persistent atrial fibrillation/flutter with RVR  Remains on amiodarone drip - heart rate proving very difficult to control - Cardiology dosing his amiodarone drip - Coumadin therapy reversed to facilitate central venous line placement and possible thoracentesis desire during acute decompensation but patient has now stabilized and these procedures are no longer felt to be required - IV heparin for now - Cardiology planning for TEE [facility]  Chronic hypotension Blood pressure much improved/more stable today  End-stage renal disease on hemodialysis - Tu/Th/Sat  Nephrology following - patient tolerating dialysis well  CAD Multivessel status post CABG x4 2008 Cardiology following - not a candidate for beta blockers or nitrates due to baseline hypotension  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 difficile colitis Continue oral vancomycin as per C. difficile treatment protocol - no evidence at present of acute complications  Type 2 diabetes mellitus with nephropathy CBGs currently well controlled  Code Status: FULL Family Communication: Spoke with patient and his wife at the bedside Disposition Plan: SDU  Consultants: Nephrology Cardiology  Orthopedics  CHF Team  Procedures: None  Antibiotics: Zosyn 4/12 > Vanc 4/12 > Oral Vanc 4/12 >  DVT prophylaxis: IV heparin  Objective: Blood pressure 92/72, pulse 94, temperature 97.9 F (36.6 C), temperature source Axillary, resp. rate 22, height 6\' 4"  (1.93 m), weight 88.9 kg (195 lb 15.8 oz), SpO2 95 %.  Intake/Output Summary (Last 24 hours) at 12/16/14 1323 Last data filed at 12/16/14 1200  Gross per 24 hour  Intake 1015.6 ml  Output   2659 ml  Net -1643.4 ml    Exam: General: No acute respiratory distress - alert and conversant Lungs: Fine crackles diffusely without wheeze Cardiovascular: Irregularly irregular - no appreciable murmur or rub Abdomen: Nontender, nondistended, soft, bowel sounds positive, no rebound, no ascites, no appreciable mass Extremities: Status post right AKA - left foot dressed and dry  Data Reviewed: Basic Metabolic Panel:  Recent Labs Lab 12/11/14 0522 12/12/14 0250 12/01/2014 0308 12/14/14 0227 12/15/14 0320 12/16/14 0841  NA 138 138 136 139 141 137  K 3.8 3.9 4.4 4.8 3.6 4.2  CL 100 99 100 102 99 97  CO2 27 24 20 20 27 26   GLUCOSE 109* 91 124* 128* 103* 109*  BUN 17 21 11 22 14 23   CREATININE 4.03* 5.16* 3.35* 4.42* 3.34* 4.81*  CALCIUM 7.9* 8.2* 7.8* 8.5 8.6 8.5  PHOS 2.5  --   --  3.4 2.9 2.9    Liver Function Tests:  Recent Labs Lab 12/12/14 0250 12/09/2014 0308 12/14/14 0227 12/15/14 0320 12/16/14 0841  AST 38* 47*  --   --   --   ALT 18 18  --   --   --   ALKPHOS 116 122*  --   --   --   BILITOT 5.0* 5.1*  --   --   --   PROT 6.3 6.4  --   --   --   ALBUMIN 1.5* 1.5* 1.5* 1.7* 1.5*   Coags:  Recent Labs Lab 12/12/14 1253 12/11/2014 0308 12/14/14 0227 12/15/14 0320 12/16/14 0305  INR 3.71* 3.61* 4.43* 2.33* 1.53*   CBC:  Recent Labs Lab 12/11/14 0522 12/12/14 1401 12/20/2014 0308 12/14/14 0227 12/15/14 0320  WBC 18.3* 21.7* 22.8* 27.6* 24.1*  HGB 8.6* 8.9* 9.1* 9.5* 9.1*  HCT 26.8* 26.3* 27.8* 28.8* 28.0*  MCV 87.0 81.4 82.7 81.1 80.7  PLT 233 254 241 307 281   CBG:  Recent Labs Lab 12/15/14 1301 12/15/14 1659 12/15/14 2117 12/16/14 0748 12/16/14 1214  GLUCAP 114* 138* 118* 117* 115*    Recent Results (from the past 240 hour(s))  Blood Culture (routine x 2)     Status: None   Collection Time: 12/24/2014  2:48 PM  Result Value Ref Range Status   Specimen Description BLOOD LEFT FOREARM  Final   Special Requests BOTTLES DRAWN AEROBIC ONLY 4CC  Final   Culture NO  GROWTH 5 DAYS  Final   Report Status 12/17/2014 FINAL  Final  Blood Culture (routine x 2)     Status: None   Collection Time: 11/29/2014  3:00 PM  Result Value Ref Range Status   Specimen Description BLOOD RIGHT HAND  Final   Special Requests   Final    BOTTLES DRAWN AEROBIC AND ANAEROBIC AEB=6CC ANA=4CC   Culture NO GROWTH 5 DAYS  Final   Report Status 12/02/2014 FINAL  Final  MRSA PCR Screening     Status: None   Collection Time: 12/04/2014  6:40 PM  Result Value Ref Range Status   MRSA by PCR NEGATIVE NEGATIVE Final    Comment:        The GeneXpert MRSA Assay (FDA approved for NASAL specimens only), is one component of a comprehensive MRSA colonization surveillance program. It is not intended to diagnose MRSA infection nor to guide or monitor treatment for MRSA infections.  Clostridium Difficile by PCR     Status: Abnormal   Collection Time: 12/11/14 10:12 AM  Result Value Ref Range Status   C difficile by pcr POSITIVE (A) NEGATIVE Final    Comment: CRITICAL RESULT CALLED TO, READ BACK BY AND VERIFIED WITH: S.HEATH AT 9449 ON 12/11/14 BY S.VANHOORNE      Studies:   Recent x-ray studies have been reviewed in detail by the Attending Physician  Scheduled Meds:  Scheduled Meds: . allopurinol  100 mg Oral Daily  . amiodarone  150 mg Intravenous Once  . antiseptic oral rinse  7 mL Mouth Rinse BID  . atorvastatin  20 mg Oral q1800  . darbepoetin (ARANESP) injection - DIALYSIS  100 mcg Intravenous Q Sat-HD  . famotidine  20 mg Oral Daily  . FLUoxetine  20 mg Oral Daily  . insulin aspart  0-5 Units Subcutaneous QHS  . insulin aspart  0-9 Units Subcutaneous TID WC  . midodrine  10 mg Oral TID WC  . piperacillin-tazobactam (ZOSYN)  IV  2.25 g Intravenous Q8H  . sodium chloride  3 mL Intravenous Q12H  . sodium chloride  3 mL Intravenous Q12H  . traZODone  50 mg Oral QHS  . vancomycin  125 mg Oral QID  . vancomycin  1,000 mg Intravenous Q T,Th,Sa-HD  . vancomycin  750 mg  Intravenous Once  . vancomycin  750 mg Intravenous Once  . warfarin  0.5 mg Oral ONCE-1800  . Warfarin - Pharmacist Dosing Inpatient   Does not apply q1800    Time spent on care of this patient: 35 mins   [facility] T , MD   [facility] Office  [phone] Pager - Text Page per Amion as per below:  On-Call/Text Page:      [email or web address]      password [email or web address]  If 7PM-7AM, please contact night-coverage [email or web address] Password [email or web address] 12/16/2014, 1:23 PM   LOS: 8 days

## 2014-12-16 NOTE — Progress Notes (Signed)
Dear Doctor: Thereasa Solo [name] patient has been identified as a candidate for PICC for the following reason (s): drug pH or osmolality (causing phlebitis, infiltration in 24 hours), poor veins/poor circulatory system (CHF, COPD, emphysema, diabetes, steroid use, IV drug abuse, etc.) and incompatible drugs (aminophyllin, TPN, heparin, given with an antibiotic) If you agree, please write an order for the indicated device. For any questions contact the Vascular Access Team at [phone] if no answer, please leave a message.  Thank you for supporting the early vascular access assessment program.

## 2014-12-16 NOTE — Progress Notes (Signed)
Bipap not needed at this time. RN stated that pt has been requesting to go on and off Bipap throughout the day and evening. RT will continue to monitor.

## 2014-12-16 NOTE — Procedures (Signed)
Patient was seen on dialysis and the procedure was supervised. BFR 400 Via R AVF BP is 99/83.  Patient appears to be tolerating treatment well.

## 2014-12-17 ENCOUNTER — Encounter (HOSPITAL_COMMUNITY): Admission: EM | Disposition: E | Payer: Self-pay | Source: Home / Self Care | Attending: Internal Medicine

## 2014-12-17 ENCOUNTER — Ambulatory Visit: Payer: Medicare Other | Admitting: Cardiothoracic Surgery

## 2014-12-17 DIAGNOSIS — I509 Heart failure, unspecified: Secondary | ICD-10-CM

## 2014-12-17 DIAGNOSIS — I9589 Other hypotension: Secondary | ICD-10-CM | POA: Diagnosis present

## 2014-12-17 DIAGNOSIS — L03116 Cellulitis of left lower limb: Secondary | ICD-10-CM | POA: Diagnosis present

## 2014-12-17 DIAGNOSIS — I4819 Other persistent atrial fibrillation: Secondary | ICD-10-CM | POA: Diagnosis present

## 2014-12-17 DIAGNOSIS — I420 Dilated cardiomyopathy: Secondary | ICD-10-CM | POA: Diagnosis present

## 2014-12-17 DIAGNOSIS — I2510 Atherosclerotic heart disease of native coronary artery without angina pectoris: Secondary | ICD-10-CM

## 2014-12-17 DIAGNOSIS — I481 Persistent atrial fibrillation: Secondary | ICD-10-CM

## 2014-12-17 DIAGNOSIS — I96 Gangrene, not elsewhere classified: Secondary | ICD-10-CM | POA: Diagnosis present

## 2014-12-17 LAB — BASIC METABOLIC PANEL
Anion gap: 15 (ref 5–15)
BUN: 16 mg/dL (ref 6–23)
CHLORIDE: 100 mmol/L (ref 96–112)
CO2: 24 mmol/L (ref 19–32)
Calcium: 8.6 mg/dL (ref 8.4–10.5)
Creatinine, Ser: 4.31 mg/dL — ABNORMAL HIGH (ref 0.50–1.35)
GFR calc Af Amer: 15 mL/min — ABNORMAL LOW (ref >90)
GFR calc non Af Amer: 13 mL/min — ABNORMAL LOW (ref >90)
Glucose, Bld: 95 mg/dL (ref 70–99)
Potassium: 4.1 mmol/L (ref 3.5–5.1)
SODIUM: 139 mmol/L (ref 135–145)

## 2014-12-17 LAB — GLUCOSE, CAPILLARY
GLUCOSE-CAPILLARY: 103 mg/dL — AB (ref 70–99)
Glucose-Capillary: 105 mg/dL — ABNORMAL HIGH (ref 70–99)
Glucose-Capillary: 117 mg/dL — ABNORMAL HIGH (ref 70–99)
Glucose-Capillary: 106 mg/dL — ABNORMAL HIGH (ref 70–99)

## 2014-12-17 LAB — CBC
HCT: 29 % — ABNORMAL LOW (ref 39.0–52.0)
Hemoglobin: 9.5 g/dL — ABNORMAL LOW (ref 13.0–17.0)
MCH: 26 pg (ref 26.0–34.0)
MCHC: 32.8 g/dL (ref 30.0–36.0)
MCV: 79.5 fL (ref 78.0–100.0)
PLATELETS: 311 10*3/uL (ref 150–400)
RBC: 3.65 MIL/uL — ABNORMAL LOW (ref 4.22–5.81)
RDW: 19.8 % — ABNORMAL HIGH (ref 11.5–15.5)
WBC: 23.9 10*3/uL — ABNORMAL HIGH (ref 4.0–10.5)

## 2014-12-17 LAB — PROTIME-INR
INR: 1.49 (ref 0.00–1.49)
Prothrombin Time: 18.2 seconds — ABNORMAL HIGH (ref 11.6–15.2)

## 2014-12-17 LAB — HEPARIN LEVEL (UNFRACTIONATED)
Heparin Unfractionated: <0.1 IU/mL — ABNORMAL LOW (ref 0.30–0.70)
Heparin Unfractionated: 0.27 IU/mL — ABNORMAL LOW (ref 0.30–0.70)
Heparin Unfractionated: <0.1 IU/mL — ABNORMAL LOW (ref 0.30–0.70)

## 2014-12-17 LAB — VANCOMYCIN, RANDOM: Vancomycin Rm: 38.2 ug/mL

## 2014-12-17 SURGERY — ECHOCARDIOGRAM, TRANSESOPHAGEAL
Anesthesia: Monitor Anesthesia Care

## 2014-12-17 MED ORDER — HEPARIN BOLUS VIA INFUSION
2500.0000 [IU] | INTRAVENOUS | Status: AC
  Administered 2014-12-17: 2500 [IU] via INTRAVENOUS
  Filled 2014-12-17: qty 2500

## 2014-12-17 MED ORDER — HEPARIN (PORCINE) IN NACL 100-0.45 UNIT/ML-% IJ SOLN
1850.0000 [IU]/h | INTRAMUSCULAR | Status: DC
  Administered 2014-12-17 (×2): 1450 [IU]/h via INTRAVENOUS
  Administered 2014-12-18: 1850 [IU]/h via INTRAVENOUS
  Filled 2014-12-17 (×3): qty 250

## 2014-12-17 MED ORDER — VANCOMYCIN HCL IN DEXTROSE 1-5 GM/200ML-% IV SOLN
1000.0000 mg | INTRAVENOUS | Status: DC
  Administered 2014-12-19 – 2014-12-22 (×2): 1000 mg via INTRAVENOUS
  Filled 2014-12-17 (×3): qty 200

## 2014-12-17 MED ORDER — WARFARIN 0.5 MG HALF TABLET
0.5000 mg | ORAL_TABLET | Freq: Once | ORAL | Status: AC
  Administered 2014-12-17: 0.5 mg via ORAL
  Filled 2014-12-17: qty 1

## 2014-12-17 MED ORDER — HEPARIN BOLUS VIA INFUSION
2500.0000 [IU] | Freq: Once | INTRAVENOUS | Status: AC
  Administered 2014-12-17: 2500 [IU] via INTRAVENOUS
  Filled 2014-12-17: qty 2500

## 2014-12-17 NOTE — Progress Notes (Addendum)
[place] for Warfarin, heparin Indication: atrial fibrillation  Allergies  Allergen Reactions  . Bacitracin Other (See Comments)    unknown  . Norvasc [Amlodipine Besylate] Other (See Comments)    unknown    Patient Measurements: Height: 6\' 4"  (193 cm) Weight: 195 lb 15.8 oz (88.9 kg) IBW/kg (Calculated) : 86.8   Vital Signs: Temp: 99 F (37.2 C) (04/20 2317) Temp Source: Oral (04/20 2317) BP: 99/60 mmHg (04/20 2319) Pulse Rate: 100 (04/21 0015)  Labs:  Recent Labs  12/14/14 0227 12/15/14 0320 12/16/14 0305 12/16/14 0841 12/16/14 2240  HGB 9.5* 9.1*  --   --   --   HCT 28.8* 28.0*  --   --   --   PLT 307 281  --   --   --   LABPROT 42.5* 25.8* 18.6*  --   --   INR 4.43* 2.33* 1.53*  --   --   HEPARINUNFRC  --   --   --   --  <0.10*  CREATININE 4.42* 3.34*  --  4.81*  --     Estimated Creatinine Clearance: 18.8 mL/min (by C-G formula based on Cr of 4.81).   Medical History: Past Medical History  Diagnosis Date  . UGI bleed 02/12/11    On anticoagulation; EGD w/snare polypectomy-multiple polypoid lesions antrum(benign), Chronic active gastritis (NEGATIVE H pylori)  . Anemia, chronic disease   . Sepsis due to enterococcus 02/11/11  . Paroxysmal atrial fibrillation     On coumadin  . Coronary atherosclerosis of native coronary artery     a. Multivessel status post CABG 2008.  . Cardiomyopathy   . Type 2 diabetes mellitus   . Essential hypertension, benign   . Hyperlipemia   . Gout   . S/P patent foramen ovale closure   . Hyperbilirubinemia 08/10/2013  . SBO (small bowel obstruction) 01/2014  . 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 Clostridium difficile colitis 01/2014  . Diverticulosis   . Internal hemorrhoids   . ESRD on hemodialysis     Started 2008-2009. Dr Hinda Lenis on a TTS schedule - R forearm AVF for several years    . Orthostatic hypotension   . S/P mitral valve replacement with bioprosthetic valve     a.  2008 at time of CABG.  . Chronic systolic CHF (congestive heart failure)     a. Last EF 15% in 07/2014.  Marland Kitchen Loculated pleural effusion     a. chronic right loculated pleural effusion s/p chest tube 10/2014.  Marland Kitchen RBBB     a. Intermittent.     Medications:  Scheduled:  . allopurinol  100 mg Oral Daily  . amiodarone  150 mg Intravenous Once  . antiseptic oral rinse  7 mL Mouth Rinse BID  . atorvastatin  20 mg Oral q1800  . darbepoetin (ARANESP) injection - DIALYSIS  100 mcg Intravenous Q Sat-HD  . famotidine  20 mg Oral Daily  . FLUoxetine  20 mg Oral Daily  . insulin aspart  0-5 Units Subcutaneous QHS  . insulin aspart  0-9 Units Subcutaneous TID WC  . midodrine  10 mg Oral TID WC  . piperacillin-tazobactam (ZOSYN)  IV  2.25 g Intravenous Q8H  . traZODone  50 mg Oral QHS  . vancomycin  125 mg Oral QID  . vancomycin  1,000 mg Intravenous Q T,Th,Sa-HD  . vancomycin  750 mg Intravenous Once  . Warfarin - Pharmacist Dosing Inpatient   Does not apply [phone]  Assessment: 29 yoM on coumadin PTA for hx Afib. Coumadin was held since 4/15 pending surgery. S/P Fem-pop surgery on 4/17. Pharmacy consulted to restart coumadin but no doses given as INR was elevated.  Vitamin K 10 mg IV given 4/19.  INR today 1.53  Coumadin to resume today with heparin bridge. Reviewing previous notes from [facility], it appears his INR fluctuates and is very sensitive to coumadin.  Documented Home Dose: 0.5 mg daily except on Sunday and Wednesday, no coumadin.  Lab delay in drawing heparin level. The 9 hour heparin level is undetectable, < 0.1 on 1200 units/hr IV heparin drip. RN reports that heparin has been infusing at 1200 units/hr without any problems and IV site looks good. No bleeding noted per RN.  Coumadin resumed 4/20 evening.    Goal of Therapy:  Heparin level 0.3-0.7 units/ml INR 2-3 Monitor platelets by anticoagulation protocol: Yes   Plan:  Give bolus of heparin 2500 units IV x1 Increase heparin  drip to1450 units/hr Check HL 6 hours after bolus & rate increase -Daily PT/INR -Monitor for signs of bleeding  Thanks for allowing pharmacy to be a part of this patient's care. Nicole Cella, RPh Clinical Pharmacist Pager: [phone] 4/21/201612:32 AM

## 2014-12-17 NOTE — Progress Notes (Signed)
TEAM 1 - Stepdown/ICU TEAM Progress Note  [name] [name] EVO:[id number] DOB: April 25, 1951 DOA: 12/11/2014 PCP: [name], MD  Admit HPI / Brief Narrative: 64 year old BM PMHx CAD status post CABG 2008 with mitral valve replacement (bioprostetic), diabetes mellitus, atrial fibrillation on Coumadin and amiodarone status post cardioversion in 2015, ESRD Tu/Th/Sat, chronic systolic heart failure with an EF of 15% in 2015, right AKA, and a right chronic loculated pleural effusion. Came to [facility] for left foot pain and was found to have cellulitis and gangrene with severe sepsis (lactic acid of 6).   MRI showed deep ulceration but no evidence of osteo. ABIs showed a heavily diseased popliteal artery with occlusion in the mid segmentand very limited flow within the runoff vessels.  On 04/14 his A. fib RVR became hard to control andhe was started on IV amiodarone and Cardiology was consulted. 04/14 after after dialysis he became hypotensive and had to be given multiple boluses of normal saline. Cardiology and IM deemed him high risk of surgery and he was therefore transferred to [facility].  HPI/Subjective: 4/21 A/O 4, states is aware that he may have one or more toes amputated.  Assessment/Plan:  Left lower extremity cellulitis with gangrene and ulceration / necrotic 3rd toe -Orthopedics following - status post I and D of left foot with third ray amputation  - continue empiric antibiotics for now as significant purulent material was encountered during the I&D  - will need eventual more extensive arterial evaluation; further surgery may be indicated will await orthopedic surgery recommendations  Cardiomyopathy / severe chronic systolic congestive heart failure EF 15% December 2015 TEE -Acute decompensation with pulmonary edema due to volume expansion in attempt to treat RVR  -Daily weights; on admission= 85.9 kg     4/21 weight= 88 kg -Strict I&O; since admission + 6.1  L - not a candidate for ACE inhibitor due to baseline hypertension  - baseline weight~80 kg;  now up 12 kg with pulmonary edema/hypoxia.   -CHF team consulted as the patient will likely require pressor/inotrope support to allow for fluid removal via dialysis  Persistent atrial fibrillation/flutter with RVR  -Remains on amiodarone drip; heart rate proving very difficult to control  - Cardiology planning for TEE [facility] on 4/22 if patient does not require further surgery  Chronic hypotension -Blood pressure with slight improvement, continue monitor closely   End-stage renal disease on hemodialysis - Tu/Th/Sat  -Nephrology following  -HD per nephrology; concerned about proceeding due to his hypotension -Continue Midrin 10 mg TID  CAD Multivessel status post CABG x4 2008 -Cardiology following - not a candidate for beta blockers or nitrates due to baseline hypotension  History of bioprosthetic mitral valve replacement 2008 with pericardial valve for severe MR  -Does not require anticoagulation as is a tissue valve   C. difficile colitis -Continue oral vancomycin as per C. difficile treatment protocol, no evidence at present of acute complications  Type 2 diabetes mellitus with nephropathy -CBGs currently well controlled    Code Status: FULL Family Communication: no family present at time of exam Disposition Plan: CIR vs SNF    Consultants: Dr.Joseph Coladonato [facility]  Dr.Christopher [facility]  CHF Team   Procedure/Significant Events: 4/17 I &.D. Lt foot including excision/ debridement of necrotic skin and fascia.- Lt foot third ray amputation.   Culture  Antibiotics: Zosyn 4/12 > Vanc 4/12 > Oral Vanc 4/12 >   DVT prophylaxis: SCDs + warfarin   Devices  LINES / TUBES:      Continuous Infusions: . amiodarone 30 mg/hr (11/30/2014 1036)  . heparin 1,850 Units/hr (12/03/2014 1811)    Objective: VITAL  SIGNS: Temp: 98.9 F (37.2 C) (04/21 1500) Temp Source: Oral (04/21 1500) BP: 89/56 mmHg (04/21 1532) Pulse Rate: 101 (04/21 1532) SPO2; FIO2:   Intake/Output Summary (Last 24 hours) at 12/05/2014 1904 Last data filed at 12/18/2014 1818  Gross per 24 hour  Intake 1334.21 ml  Output      0 ml  Net 1334.21 ml     Exam: General: A/O 4, mild acute respiratory distress Lungs: Clear to auscultation bilaterally without wheezes or crackles Cardiovascular: Tachycardic, Regular rhythm without murmur gallop or rub normal S1 and S2 Abdomen: Nontender, nondistended, soft, bowel sounds positive, no rebound, no ascites, no appreciable mass Extremities: No significant cyanosis, clubbing. Left foot covered and clean, mild serosanguineous fluid draining, negative purulent drainage; toes cold, blue however patient able to move all toes on command. Patient's third left metatarsal black cold to touch, unable to move on command. Right AKA negative ulcerations well-healed scar.  Data Reviewed: Basic Metabolic Panel:  Recent Labs Lab 12/11/14 0522  12/24/2014 0308 12/14/14 0227 12/15/14 0320 12/16/14 0841 12/05/2014 0345  NA 138  < > 136 139 141 137 139  K 3.8  < > 4.4 4.8 3.6 4.2 4.1  CL 100  < > 100 102 99 97 100  CO2 27  < > 20 20 27 26 24   GLUCOSE 109*  < > 124* 128* 103* 109* 95  BUN 17  < > 11 22 14 23 16   CREATININE 4.03*  < > 3.35* 4.42* 3.34* 4.81* 4.31*  CALCIUM 7.9*  < > 7.8* 8.5 8.6 8.5 8.6  PHOS 2.5  --   --  3.4 2.9 2.9  --   < > = values in this interval not displayed. Liver Function Tests:  Recent Labs Lab 12/12/14 0250 12/18/2014 0308 12/14/14 0227 12/15/14 0320 12/16/14 0841  AST 38* 47*  --   --   --   ALT 18 18  --   --   --   ALKPHOS 116 122*  --   --   --   BILITOT 5.0* 5.1*  --   --   --   PROT 6.3 6.4  --   --   --   ALBUMIN 1.5* 1.5* 1.5* 1.7* 1.5*   No results for input(s): LIPASE, AMYLASE in the last 168 hours. No results for input(s): AMMONIA in the last 168  hours. CBC:  Recent Labs Lab 12/12/14 1401 12/07/2014 0308 12/14/14 0227 12/15/14 0320 12/06/2014 0345  WBC 21.7* 22.8* 27.6* 24.1* 23.9*  HGB 8.9* 9.1* 9.5* 9.1* 9.5*  HCT 26.3* 27.8* 28.8* 28.0* 29.0*  MCV 81.4 82.7 81.1 80.7 79.5  PLT 254 241 307 281 311   Cardiac Enzymes: No results for input(s): CKTOTAL, CKMB, CKMBINDEX, TROPONINI in the last 168 hours. BNP (last 3 results) No results for input(s): BNP in the last 8760 hours.  ProBNP (last 3 results)  Recent Labs  08/15/14 0245  PROBNP >70000.0*    CBG:  Recent Labs Lab 12/16/14 1555 12/16/14 2149 12/16/2014 0758 12/16/2014 1137 12/04/2014 1725  GLUCAP 134* 90 105* 117* 106*    Recent Results (from the past 240 hour(s))  Blood Culture (routine x 2)     Status: None   Collection Time: 12/03/2014  2:48 PM  Result Value Ref Range Status   Specimen Description BLOOD LEFT FOREARM  Final   Special Requests BOTTLES DRAWN AEROBIC ONLY 4CC  Final   Culture NO GROWTH 5 DAYS  Final   Report Status 12/03/2014 FINAL  Final  Blood Culture (routine x 2)     Status: None   Collection Time: 12/03/2014  3:00 PM  Result Value Ref Range Status   Specimen Description BLOOD RIGHT HAND  Final   Special Requests   Final    BOTTLES DRAWN AEROBIC AND ANAEROBIC AEB=6CC ANA=4CC   Culture NO GROWTH 5 DAYS  Final   Report Status 11/29/2014 FINAL  Final  MRSA PCR Screening     Status: None   Collection Time: 12/05/2014  6:40 PM  Result Value Ref Range Status   MRSA by PCR NEGATIVE NEGATIVE Final    Comment:        The GeneXpert MRSA Assay (FDA approved for NASAL specimens only), is one component of a comprehensive MRSA colonization surveillance program. It is not intended to diagnose MRSA infection nor to guide or monitor treatment for MRSA infections.   Clostridium Difficile by PCR     Status: Abnormal   Collection Time: 12/11/14 10:12 AM  Result Value Ref Range Status   C difficile by pcr POSITIVE (A) NEGATIVE Final    Comment:  CRITICAL RESULT CALLED TO, READ BACK BY AND VERIFIED WITH: S.HEATH AT 7628 ON 12/11/14 BY S.VANHOORNE      Studies:  Recent x-ray studies have been reviewed in detail by the Attending Physician  Scheduled Meds:  Scheduled Meds: . allopurinol  100 mg Oral Daily  . amiodarone  150 mg Intravenous Once  . antiseptic oral rinse  7 mL Mouth Rinse BID  . atorvastatin  20 mg Oral q1800  . darbepoetin (ARANESP) injection - DIALYSIS  100 mcg Intravenous Q Sat-HD  . famotidine  20 mg Oral Daily  . FLUoxetine  20 mg Oral Daily  . insulin aspart  0-5 Units Subcutaneous QHS  . insulin aspart  0-9 Units Subcutaneous TID WC  . midodrine  10 mg Oral TID WC  . piperacillin-tazobactam (ZOSYN)  IV  2.25 g Intravenous Q8H  . traZODone  50 mg Oral QHS  . vancomycin  125 mg Oral QID  . [START ON 12/19/2014] vancomycin  1,000 mg Intravenous Q T,Th,Sa-HD  . Warfarin - Pharmacist Dosing Inpatient   Does not apply q1800    Time spent on care of this patient: 40 mins   Cammi Consalvo, Geraldo Docker , MD  [facility] Office  [phone] Pager - [phone]  On-Call/Text Page:      [email or web address]      password [email or web address]  If 7PM-7AM, please contact night-coverage [email or web address] Password [email or web address] 12/15/2014, 7:04 PM   LOS: 9 days   Care during the described time interval was provided by me .  I have reviewed this patient's available data, including medical history, events of note, physical examination, radiology studies and test results as part of my evaluation  Dia Crawford, MD [phone] Pager

## 2014-12-17 NOTE — Progress Notes (Signed)
ANTICOAGULATION/ANTIBIOTIC CONSULT NOTE  Pharmacy Consult for Warfarin, heparin, vancomycin Indication: atrial fibrillation, cellulitis  Allergies  Allergen Reactions  . Bacitracin Other (See Comments)    unknown  . Norvasc [Amlodipine Besylate] Other (See Comments)    unknown    Patient Measurements: Height: 6\' 4"  (193 cm) Weight: 194 lb 0.1 oz (88 kg) IBW/kg (Calculated) : 86.8   Vital Signs: Temp: 97.9 F (36.6 C) (04/21 0318) Temp Source: Oral (04/21 0318) BP: 93/55 mmHg (04/21 0845) Pulse Rate: 102 (04/21 0845)  Labs:  Recent Labs  12/15/14 0320 12/16/14 0305 12/16/14 0841 12/16/14 2240 12/25/2014 0345 12/13/2014 0650  HGB 9.1*  --   --   --  9.5*  --   HCT 28.0*  --   --   --  29.0*  --   PLT 281  --   --   --  311  --   LABPROT 25.8* 18.6*  --   --  18.2*  --   INR 2.33* 1.53*  --   --  1.49  --   HEPARINUNFRC  --   --   --  <0.10*  --  0.27*  CREATININE 3.34*  --  4.81*  --  4.31*  --     Estimated Creatinine Clearance: 21 mL/min (by C-G formula based on Cr of 4.31).   Medical History: Past Medical History  Diagnosis Date  . UGI bleed 02/12/11    On anticoagulation; EGD w/snare polypectomy-multiple polypoid lesions antrum(benign), Chronic active gastritis (NEGATIVE H pylori)  . Anemia, chronic disease   . Sepsis due to enterococcus 02/11/11  . Paroxysmal atrial fibrillation     On coumadin  . Coronary atherosclerosis of native coronary artery     a. Multivessel status post CABG 2008.  . Cardiomyopathy   . Type 2 diabetes mellitus   . Essential hypertension, benign   . Hyperlipemia   . Gout   . S/P patent foramen ovale closure   . Hyperbilirubinemia 08/10/2013  . SBO (small bowel obstruction) 01/2014  . 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 Clostridium difficile colitis 01/2014  . Diverticulosis   . Internal hemorrhoids   . ESRD on hemodialysis     Started 2008-2009. Dr Hinda Lenis on a TTS schedule - R forearm AVF for several years    . Orthostatic  hypotension   . S/P mitral valve replacement with bioprosthetic valve     a. 2008 at time of CABG.  . Chronic systolic CHF (congestive heart failure)     a. Last EF 15% in 07/2014.  Marland Kitchen Loculated pleural effusion     a. chronic right loculated pleural effusion s/p chest tube 10/2014.  Marland Kitchen RBBB     a. Intermittent.     Medications:  Scheduled:  . allopurinol  100 mg Oral Daily  . amiodarone  150 mg Intravenous Once  . antiseptic oral rinse  7 mL Mouth Rinse BID  . atorvastatin  20 mg Oral q1800  . darbepoetin (ARANESP) injection - DIALYSIS  100 mcg Intravenous Q Sat-HD  . famotidine  20 mg Oral Daily  . FLUoxetine  20 mg Oral Daily  . insulin aspart  0-5 Units Subcutaneous QHS  . insulin aspart  0-9 Units Subcutaneous TID WC  . midodrine  10 mg Oral TID WC  . piperacillin-tazobactam (ZOSYN)  IV  2.25 g Intravenous Q8H  . traZODone  50 mg Oral QHS  . vancomycin  125 mg Oral QID  . [START ON 12/19/2014] vancomycin  1,000 mg Intravenous Q T,Th,Sa-HD  . warfarin  0.5 mg Oral ONCE-1800  . Warfarin - Pharmacist Dosing Inpatient   Does not apply q1800    Assessment: 47 yoM on coumadin PTA for hx Afib. Coumadin was held since 4/15 pending surgery. S/P Fem-pop surgery on 4/17. Pharmacy consulted to restart coumadin but no doses given as INR was elevated.  Vitamin K 10 mg IV given 4/19.  INR today 1.49  Continues on heparin bridge. Heprain level this am is slightly below goal 0.27 units/ml Documented Home Dose: 0.5 mg daily except on Sunday and Wednesday, no coumadin. Vancomycin level this am 38.2 mcg/ml  Patient did receive dose yesterday after additional HD.   Goal of Therapy:  preHD vanc level 20-25 mcg/ml Heparin level 0.3-0.7 units/ml INR 2-3 Monitor platelets by anticoagulation protocol: Yes   Plan:  Icrease heparin drip to1550 units/hr Check HL 8 hours after rate increase Coumadin 0.5 mg today -Daily PT/INR -Monitor for signs of bleeding -Will hold vancomycin scheduled for  after dialysis today -f/u further dialysis  Thanks for allowing pharmacy to be a part of this patient's care.  Excell Seltzer, PharmD Clinical Pharmacist, [phone]  4/21/20168:57 AM

## 2014-12-17 NOTE — Progress Notes (Signed)
Pt placed on Bipap per pt request.

## 2014-12-17 NOTE — Consult Note (Signed)
Reason for Consult: Gangrene left foot status post foot third Ray amputation Referring Physician: Dr. Edd Fabian [name] is an 64 y.o. male.  HPI: Patient is a 64 year old gentleman with severe peripheral vascular disease coronary artery disease arrhythmia with a ejection fraction abilities 15% who is status post a third toe amputation who was seen for evaluation for foot salvage intervention.  Past Medical History  Diagnosis Date  . UGI bleed 02/12/11    On anticoagulation; EGD w/snare polypectomy-multiple polypoid lesions antrum(benign), Chronic active gastritis (NEGATIVE H pylori)  . Anemia, chronic disease   . Sepsis due to enterococcus 02/11/11  . Paroxysmal atrial fibrillation     On coumadin  . Coronary atherosclerosis of native coronary artery     a. Multivessel status post CABG 2008.  . Cardiomyopathy   . Type 2 diabetes mellitus   . Essential hypertension, benign   . Hyperlipemia   . Gout   . S/P patent foramen ovale closure   . Hyperbilirubinemia 08/10/2013  . SBO (small bowel obstruction) 01/2014  . 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 Clostridium difficile colitis 01/2014  . Diverticulosis   . Internal hemorrhoids   . ESRD on hemodialysis     Started 2008-2009. Dr Hinda Lenis on a TTS schedule - R forearm AVF for several years    . Orthostatic hypotension   . S/P mitral valve replacement with bioprosthetic valve     a. 2008 at time of CABG.  . Chronic systolic CHF (congestive heart failure)     a. Last EF 15% in 07/2014.  Marland Kitchen Loculated pleural effusion     a. chronic right loculated pleural effusion s/p chest tube 10/2014.  Marland Kitchen RBBB     a. Intermittent.     Past Surgical History  Procedure Laterality Date  . Mitral valve replacement  01/05/2007    Bioprosthetic 22m Edwards precordial tissue (Dr. GServando Snare  . Coronary artery bypass graft  01/05/2007    LIMA-LAD, SVG-OM, seq. SVG-PDA, PLA-RCA  . Colonoscopy  06/23/2011    Dr. FOneida Alar multiple sessile and pedunculated  polyps, moderate diverticulosis, internal hemorrhoids, +ADENOMATOUS POLYPS, consider genetic testing, surveillance in October 2015   . Esophagogastroduodenoscopy  02/12/2011    CHRONIC ACTIVE GASTRITIS, NO H. pylori  . Transthoracic echocardiogram  11/2011    EF 25% w/ mod dilatation of LV & mod LVH; LA severely dilated; mod-severe dilation of RV with mod-severe decrease in RV function; mild MR & TR  . Cardioversion  04/19/2007    Dr. RMarella Chimes . Transthoracic echocardiogram  02/14/2011    EF 40-45%, mod conc LVH; ventricular septum with motion showing abnormal function, dyssynergy, paradox; mild AS; mild-mod MR stenosis; LA severely dilated; aptrial septum bowed from L to R with increased LA pressure   . Amputation Right 07/14/2013    Procedure: AMPUTATION DIGIT;  Surgeon: [name] [name];  Location: [facility];  Service: General;  Laterality: Right;  . Amputation Right 08/08/2013    Procedure: AMPUTATION BELOW KNEE;  Surgeon: [name] [name];  Location: [facility];  Service: General;  Laterality: Right;  . Stump revision Right 09/19/2013    Procedure: STUMP REVISION;  Surgeon: WScherry Ran [name];  Location: [facility];  Service: General;  Laterality: Right;  . Amputation Right 10/17/2013    Procedure: AMPUTATION ABOVE KNEE;  Surgeon: [name] [name];  Location: [facility];  Service: General;  Laterality: Right;  . Colonoscopy N/A 04/10/2014    Procedure: COLONOSCOPY;  Surgeon:  [name] Binder, [name];  Location: [facility];  Service: Endoscopy;  Laterality: N/A;  830  . Tee without cardioversion N/A 08/19/2014    Procedure: TRANSESOPHAGEAL ECHOCARDIOGRAM (TEE);  Surgeon: [name] Perla, [name];  Location: [facility];  Service: Cardiovascular;  Laterality: N/A;  . Cardioversion N/A 08/19/2014    Procedure: CARDIOVERSION;  Surgeon: [name] Perla, [name];  Location: [facility];  Service: Cardiovascular;  Laterality: N/A;  . Amputation Left 12/21/2014    Procedure: AMPUTATION LEFT THIRD TOE;  Surgeon:  [name] Rossetti, [name];  Location: [place];  Service: Orthopedics;  Laterality: Left;    Family History  Problem Relation Age of Onset  . Diabetes Mother   . Diabetes Father   . Diabetes Sister   . Colon cancer Neg Hx   . Colon polyps Neg Hx     Social History:  reports that he quit smoking about 8 years ago. His smoking use included Cigarettes. He started smoking about 45 years ago. He has a 8 pack-year smoking history. He has never used smokeless tobacco. He reports that he does not drink alcohol or use illicit drugs.  Allergies:  Allergies  Allergen Reactions  . Bacitracin Other (See Comments)    unknown  . Norvasc [Amlodipine Besylate] Other (See Comments)    unknown    Medications: I have reviewed the patient's current medications.  Results for orders placed or performed during the hospital encounter of 12/07/2014 (from the past 48 hour(s))  Glucose, capillary     Status: Abnormal   Collection Time: 12/15/14  9:17 PM  Result Value Ref Range   Glucose-Capillary 118 (H) 70 - 99 mg/dL  Protime-INR     Status: Abnormal   Collection Time: 12/16/14  3:05 AM  Result Value Ref Range   Prothrombin Time 18.6 (H) 11.6 - 15.2 seconds   INR 1.53 (H) 0.00 - 1.49  Glucose, capillary     Status: Abnormal   Collection Time: 12/16/14  7:48 AM  Result Value Ref Range   Glucose-Capillary 117 (H) 70 - 99 mg/dL   Comment 1 Notify RN    Comment 2 Document in Chart   Renal function panel     Status: Abnormal   Collection Time: 12/16/14  8:41 AM  Result Value Ref Range   Sodium 137 135 - 145 mmol/L   Potassium 4.2 3.5 - 5.1 mmol/L   Chloride 97 96 - 112 mmol/L   CO2 26 19 - 32 mmol/L   Glucose, Bld 109 (H) 70 - 99 mg/dL   BUN 23 6 - 23 mg/dL   Creatinine, Ser 4.81 (H) 0.50 - 1.35 mg/dL   Calcium 8.5 8.4 - 10.5 mg/dL   Phosphorus 2.9 2.3 - 4.6 mg/dL   Albumin 1.5 (L) 3.5 - 5.2 g/dL   GFR calc non Af Amer 12 (L) >90 mL/min   GFR calc Af Amer 13 (L) >90 mL/min    Comment: (NOTE) The  eGFR has been calculated using the CKD EPI equation. This calculation has not been validated in all clinical situations. eGFR's persistently <90 mL/min signify possible Chronic Kidney Disease.    Anion gap 14 5 - 15  Glucose, capillary     Status: Abnormal   Collection Time: 12/16/14 12:14 PM  Result Value Ref Range   Glucose-Capillary 115 (H) 70 - 99 mg/dL  Glucose, capillary     Status: Abnormal   Collection Time: 12/16/14  3:55 PM  Result Value Ref Range   Glucose-Capillary 134 (H) 70 -  99 mg/dL  Glucose, capillary     Status: None   Collection Time: 12/16/14  9:49 PM  Result Value Ref Range   Glucose-Capillary 90 70 - 99 mg/dL  Heparin level (unfractionated)     Status: Abnormal   Collection Time: 12/16/14 10:40 PM  Result Value Ref Range   Heparin Unfractionated <0.10 (L) 0.30 - 0.70 IU/mL    Comment:        IF HEPARIN RESULTS ARE BELOW EXPECTED VALUES, AND PATIENT DOSAGE HAS BEEN CONFIRMED, SUGGEST FOLLOW UP TESTING OF ANTITHROMBIN III LEVELS.   Protime-INR     Status: Abnormal   Collection Time: 12/04/2014  3:45 AM  Result Value Ref Range   Prothrombin Time 18.2 (H) 11.6 - 15.2 seconds   INR 1.49 0.00 - 1.49  Vancomycin, random     Status: None   Collection Time: 12/07/2014  3:45 AM  Result Value Ref Range   Vancomycin Rm 38.2 ug/mL    Comment:        Random Vancomycin therapeutic range is dependent on dosage and time of specimen collection. A peak range is 20.0-40.0 ug/mL A trough range is 5.0-15.0 ug/mL          CBC     Status: Abnormal   Collection Time: 11/28/2014  3:45 AM  Result Value Ref Range   WBC 23.9 (H) 4.0 - 10.5 K/uL   RBC 3.65 (L) 4.22 - 5.81 MIL/uL   Hemoglobin 9.5 (L) 13.0 - 17.0 g/dL   HCT 29.0 (L) 39.0 - 52.0 %   MCV 79.5 78.0 - 100.0 fL   MCH 26.0 26.0 - 34.0 pg   MCHC 32.8 30.0 - 36.0 g/dL   RDW 19.8 (H) 11.5 - 15.5 %   Platelets 311 150 - 400 K/uL  Basic metabolic panel     Status: Abnormal   Collection Time: 12/09/2014  3:45 AM  Result  Value Ref Range   Sodium 139 135 - 145 mmol/L   Potassium 4.1 3.5 - 5.1 mmol/L   Chloride 100 96 - 112 mmol/L   CO2 24 19 - 32 mmol/L   Glucose, Bld 95 70 - 99 mg/dL   BUN 16 6 - 23 mg/dL   Creatinine, Ser 4.31 (H) 0.50 - 1.35 mg/dL   Calcium 8.6 8.4 - 10.5 mg/dL   GFR calc non Af Amer 13 (L) >90 mL/min   GFR calc Af Amer 15 (L) >90 mL/min    Comment: (NOTE) The eGFR has been calculated using the CKD EPI equation. This calculation has not been validated in all clinical situations. eGFR's persistently <90 mL/min signify possible Chronic Kidney Disease.    Anion gap 15 5 - 15  Heparin level (unfractionated)     Status: Abnormal   Collection Time: 12/07/2014  6:50 AM  Result Value Ref Range   Heparin Unfractionated 0.27 (L) 0.30 - 0.70 IU/mL    Comment:        IF HEPARIN RESULTS ARE BELOW EXPECTED VALUES, AND PATIENT DOSAGE HAS BEEN CONFIRMED, SUGGEST FOLLOW UP TESTING OF ANTITHROMBIN III LEVELS.   Glucose, capillary     Status: Abnormal   Collection Time: 12/05/2014  7:58 AM  Result Value Ref Range   Glucose-Capillary 105 (H) 70 - 99 mg/dL  Glucose, capillary     Status: Abnormal   Collection Time: 12/16/2014 11:37 AM  Result Value Ref Range   Glucose-Capillary 117 (H) 70 - 99 mg/dL  Heparin level (unfractionated)     Status: Abnormal   Collection  Time: 12/02/2014  5:01 PM  Result Value Ref Range   Heparin Unfractionated <0.10 (L) 0.30 - 0.70 IU/mL    Comment:        IF HEPARIN RESULTS ARE BELOW EXPECTED VALUES, AND PATIENT DOSAGE HAS BEEN CONFIRMED, SUGGEST FOLLOW UP TESTING OF ANTITHROMBIN III LEVELS.   Glucose, capillary     Status: Abnormal   Collection Time: 12/25/2014  5:25 PM  Result Value Ref Range   Glucose-Capillary 106 (H) 70 - 99 mg/dL   Comment 1 Notify RN    Comment 2 Document in Chart     No results found.  Review of Systems  All other systems reviewed and are negative.  Blood pressure 89/56, pulse 101, temperature 98.9 F (37.2 C), temperature source  Oral, resp. rate 20, height _0  (1.93 m), weight 88 kg (194 lb 0.1 oz), SpO2 90 %. Physical Exam On examination patient's foot is cold to the touch. The fourth toe is shriveled black and necrotic. There is ischemic ulcerations that extend to the hindfoot there are dorsal ischemic ulcers on the left foot as well as an ischemic ulcer on the hindfoot. His calf is also cool to the touch. There is good hair growth to the mid aspect of his calf. Assessment/Plan: Assessment: Progressive gangrenous changes left foot without foot salvage intervention options.  Plan: Options are to follow his foot conservatively there is no abscess no cellulitis no signs of infection. Surgical intervention would require at minimum a transtibial amputation. Patient may eventually require an above-the-knee amputation due to his severe peripheral vascular disease. I will discuss the patient's care with his wife and plan on surgery pending conversations with his wife. Patient is scheduled for cardioversion tomorrow morning. If surgery is required I will not need to stop or hold any of his anticoagulation medicine.  [name] [name] 12/09/2014, 8:08 PM

## 2014-12-17 NOTE — Progress Notes (Signed)
ANTICOAGULATION/ANTIBIOTIC CONSULT NOTE  Pharmacy Consult for Warfarin, heparin Indication: atrial fibrillation  Allergies  Allergen Reactions  . Bacitracin Other (See Comments)    unknown  . Norvasc [Amlodipine Besylate] Other (See Comments)    unknown    Patient Measurements: Height: 6\' 4"  (193 cm) Weight: 194 lb 0.1 oz (88 kg) IBW/kg (Calculated) : 86.8   Vital Signs: Temp: 98.9 F (37.2 C) (04/21 1500) Temp Source: Oral (04/21 1500) BP: 108/64 mmHg (04/21 1113) Pulse Rate: 106 (04/21 1455)  Labs:  Recent Labs  12/15/14 0320 12/16/14 0305 12/16/14 0841 12/16/14 2240 12/24/2014 0345 12/26/2014 0650 12/21/2014 1701  HGB 9.1*  --   --   --  9.5*  --   --   HCT 28.0*  --   --   --  29.0*  --   --   PLT 281  --   --   --  311  --   --   LABPROT 25.8* 18.6*  --   --  18.2*  --   --   INR 2.33* 1.53*  --   --  1.49  --   --   HEPARINUNFRC  --   --   --  <0.10*  --  0.27* <0.10*  CREATININE 3.34*  --  4.81*  --  4.31*  --   --     Estimated Creatinine Clearance: 21 mL/min (by C-G formula based on Cr of 4.31).   Medical History: Past Medical History  Diagnosis Date  . UGI bleed 02/12/11    On anticoagulation; EGD w/snare polypectomy-multiple polypoid lesions antrum(benign), Chronic active gastritis (NEGATIVE H pylori)  . Anemia, chronic disease   . Sepsis due to enterococcus 02/11/11  . Paroxysmal atrial fibrillation     On coumadin  . Coronary atherosclerosis of native coronary artery     a. Multivessel status post CABG 2008.  . Cardiomyopathy   . Type 2 diabetes mellitus   . Essential hypertension, benign   . Hyperlipemia   . Gout   . S/P patent foramen ovale closure   . Hyperbilirubinemia 08/10/2013  . SBO (small bowel obstruction) 01/2014  . 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 Clostridium difficile colitis 01/2014  . Diverticulosis   . Internal hemorrhoids   . ESRD on hemodialysis     Started 2008-2009. Dr Hinda Lenis on a TTS schedule - R forearm AVF for  several years    . Orthostatic hypotension   . S/P mitral valve replacement with bioprosthetic valve     a. 2008 at time of CABG.  . Chronic systolic CHF (congestive heart failure)     a. Last EF 15% in 07/2014.  Marland Kitchen Loculated pleural effusion     a. chronic right loculated pleural effusion s/p chest tube 10/2014.  Marland Kitchen RBBB     a. Intermittent.     Medications:  Scheduled:  . allopurinol  100 mg Oral Daily  . amiodarone  150 mg Intravenous Once  . antiseptic oral rinse  7 mL Mouth Rinse BID  . atorvastatin  20 mg Oral q1800  . darbepoetin (ARANESP) injection - DIALYSIS  100 mcg Intravenous Q Sat-HD  . famotidine  20 mg Oral Daily  . FLUoxetine  20 mg Oral Daily  . insulin aspart  0-5 Units Subcutaneous QHS  . insulin aspart  0-9 Units Subcutaneous TID WC  . midodrine  10 mg Oral TID WC  . piperacillin-tazobactam (ZOSYN)  IV  2.25 g Intravenous Q8H  . traZODone  50 mg Oral QHS  . vancomycin  125 mg Oral QID  . [START ON 12/19/2014] vancomycin  1,000 mg Intravenous Q T,Th,Sa-HD  . Warfarin - Pharmacist Dosing Inpatient   Does not apply q1800    Assessment: 40 yoM on coumadin PTA for hx Afib. Coumadin was held since 4/15 pending surgery. S/P Fem-pop surgery on 4/17. Pharmacy consulted to restart coumadin but no doses given as INR was elevated.  Vitamin K 10 mg IV given 4/19.  INR today 1.49  Continues on heparin bridge. Heprain level this am is slightly below goal 0.27 units/ml  Documented Home Dose: 0.5 mg daily except on Sunday and Wednesday, no coumadin.  Follow-up HL is undetectable on 1550 units/hr. Nurse reports no issues with infusion or with bleeding.  Goal of Therapy:  preHD vanc level 20-25 mcg/ml Heparin level 0.3-0.7 units/ml INR 2-3 Monitor platelets by anticoagulation protocol: Yes   Plan:  Bolus heparin 2500 units Increase heparin to 1850 units/hr Check HL 8 hours after rate increase Daily HL/PT/INR Monitor for signs of bleeding  Andrey Cota. Diona Foley,  PharmD Clinical Pharmacist Pager [phone] 4/21/20166:01 PM

## 2014-12-17 NOTE — Progress Notes (Signed)
Patient ID: [name] [name], male   DOB: 04-Dec-1950, 64 y.o.   MRN: [id number] Advanced Heart Failure Rounding Note   Subjective:    [name] [name] is a 64 y/o M with CAD s/p CABG in 2008 with bioprosthetic mitral valve replacement at that time, ESRD on HD, prior GI bleed, HTN, HLD, PAF on oral amiodarone/Coumadin (s/p TEE/DCCV in 66/2094), chronic systolic dysfunction (last EF 15% in 07/2014), anemia, R AKA, chronic right loculated pleural effusion s/p chest tube 10/2014 .   Admitted to [facility] with necrotic L foot and A fib RVR. Placed on antibiotics.  He was started on cardizem that was later stopped and he was placed on amiodarone drip after he was evaluated by cardiology.  He transferred to [facility] on April 18 due to increased surgical risk.  BB stopped with hypotension.   He went to OR on 4/17 and had 3rd ray amputation. Over night he developed respiratory distress and placed on increased oxygen and now on BiPap.  CXR with Increased pulmonary edema and R and L pleural effusions --R>L. Hypotensive with SBP in 80s initially.    Stable today, SBP 90s-100s, no pressors.  Not short of breath at rest.  Remains in atrial fibrillation on amiodarone gtt, HR 90s-100s.  We had considered doing TEE-DCCV today but no openings in endo for him.   Objective:   Weight Range:  Vital Signs:   Temp:  [97.5 F (36.4 C)-99.6 F (37.6 C)] 97.9 F (36.6 C) (04/21 0318) Pulse Rate:  [25-110] 102 (04/21 0845) Resp:  [14-44] 23 (04/21 0845) BP: (84-123)/(55-95) 93/55 mmHg (04/21 0845) SpO2:  [83 %-100 %] 98 % (04/21 0845) Weight:  [194 lb 0.1 oz (88 kg)-195 lb 15.8 oz (88.9 kg)] 194 lb 0.1 oz (88 kg) (04/21 0500) Last BM Date: 12/14/14  Weight change: Filed Weights   12/16/14 0500 12/16/14 1150 12/16/2014 0500  Weight: 201 lb 11.5 oz (91.5 kg) 195 lb 15.8 oz (88.9 kg) 194 lb 0.1 oz (88 kg)    Intake/Output:   Intake/Output Summary (Last 24 hours) at 12/03/2014 1057 Last data filed at 12/26/2014 0814  Gross per 24 hour   Intake 1328.88 ml  Output   2659 ml  Net -1330.12 ml     Physical Exam: General:  Chronically ill appearing. On BiPAP HEENT: normal Neck: supple. JVP 10 cm.  Carotids 2+ bilat; no bruits. No lymphadenopathy or thryomegaly appreciated. Cor: PMI nondisplaced. Regular rate & rhythm. No rubs, gallops or murmurs. Lungs: Decreased breath sounds RLL LLL Abdomen: soft, nontender, nondistended. No hepatosplenomegaly. No bruits or masses. Good bowel sounds. Extremities: no cyanosis, clubbing, rash, edema R AKA LLE foot dressing in tact Neuro: drowsy but does arouse. On Bipap. Has R AKA. moves all 4 extremities w/o difficulty. Affect pleasant  Telemetry:  A Fib 90s-100s  Labs: Basic Metabolic Panel:  Recent Labs Lab 12/11/14 0522  12/19/2014 0308 12/14/14 0227 12/15/14 0320 12/16/14 0841 12/16/2014 0345  NA 138  < > 136 139 141 137 139  K 3.8  < > 4.4 4.8 3.6 4.2 4.1  CL 100  < > 100 102 99 97 100  CO2 27  < > 20 20 27 26 24   GLUCOSE 109*  < > 124* 128* 103* 109* 95  BUN 17  < > 11 22 14 23 16   CREATININE 4.03*  < > 3.35* 4.42* 3.34* 4.81* 4.31*  CALCIUM 7.9*  < > 7.8* 8.5 8.6 8.5 8.6  PHOS 2.5  --   --  3.4 2.9 2.9  --   < > = values in this interval not displayed.  Liver Function Tests:  Recent Labs Lab 12/12/14 0250 12/03/2014 0308 12/14/14 0227 12/15/14 0320 12/16/14 0841  AST 38* 47*  --   --   --   ALT 18 18  --   --   --   ALKPHOS 116 122*  --   --   --   BILITOT 5.0* 5.1*  --   --   --   PROT 6.3 6.4  --   --   --   ALBUMIN 1.5* 1.5* 1.5* 1.7* 1.5*   No results for input(s): LIPASE, AMYLASE in the last 168 hours. No results for input(s): AMMONIA in the last 168 hours.  CBC:  Recent Labs Lab 12/12/14 1401 12/08/2014 0308 12/14/14 0227 12/15/14 0320 12/02/2014 0345  WBC 21.7* 22.8* 27.6* 24.1* 23.9*  HGB 8.9* 9.1* 9.5* 9.1* 9.5*  HCT 26.3* 27.8* 28.8* 28.0* 29.0*  MCV 81.4 82.7 81.1 80.7 79.5  PLT 254 241 307 281 311    Cardiac Enzymes: No results for  input(s): CKTOTAL, CKMB, CKMBINDEX, TROPONINI in the last 168 hours.  BNP: BNP (last 3 results) No results for input(s): BNP in the last 8760 hours.  ProBNP (last 3 results)  Recent Labs  08/15/14 0245  PROBNP >70000.0*      Other results:  Imaging: No results found.   Medications:     Scheduled Medications: . allopurinol  100 mg Oral Daily  . amiodarone  150 mg Intravenous Once  . antiseptic oral rinse  7 mL Mouth Rinse BID  . atorvastatin  20 mg Oral q1800  . darbepoetin (ARANESP) injection - DIALYSIS  100 mcg Intravenous Q Sat-HD  . famotidine  20 mg Oral Daily  . FLUoxetine  20 mg Oral Daily  . insulin aspart  0-5 Units Subcutaneous QHS  . insulin aspart  0-9 Units Subcutaneous TID WC  . midodrine  10 mg Oral TID WC  . piperacillin-tazobactam (ZOSYN)  IV  2.25 g Intravenous Q8H  . traZODone  50 mg Oral QHS  . vancomycin  125 mg Oral QID  . [START ON 12/19/2014] vancomycin  1,000 mg Intravenous Q T,Th,Sa-HD  . warfarin  0.5 mg Oral ONCE-1800  . Warfarin - Pharmacist Dosing Inpatient   Does not apply q1800    Infusions: . amiodarone 30 mg/hr (12/22/2014 1036)  . heparin 1,550 Units/hr (12/17/14 0854)    PRN Medications: sodium chloride, sodium chloride, acetaminophen, feeding supplement (NEPRO CARB STEADY), heparin, heparin, HYDROcodone-acetaminophen, lidocaine (PF), lidocaine-prilocaine, metoprolol, ondansetron **OR** ondansetron (ZOFRAN) IV, pentafluoroprop-tetrafluoroeth   Assessment/Plan    1. Acute Respiratory Failure: Pulmonary edema with acute/chronic systolic CHF.  Now on nasal cannula.  2. Hypotension: Has history of orthostatic hypotension in setting of ischemic cardiomyopathy and was on midodrine 10 mg twice a day at home. He has had trouble tolerating cardiac meds historically.  Suspect hypotension in the hospital may have been combination of cardiogenic and septic/vasodilatory shock.  BP now better, SBP 90s-100s. - No need for pressors at this  point.  - Some degree of hypotension has been a Mckimmy-standing problem for [name] [name].  Would continue midodrine at home dose.     3. Persistent atrial fibrillation with mild RVR: Most recent DC-CV 07/2014. Back in atrial fibrillation this admission. Continue amiodarone drip for rate control. Stopped beta blocker with concern for low output and low BP.  INR 1.4 today, on heparin gtt and coumadin per pharmacy.  Now that he has had a fair amount of fluid off via HD, I think that he would benefit from TEE-guided DCCV.  Had planned today but no openings in endo.  Now it appears that he may need additional surgery.  If he will need more surgery, would hold off on DCCV until after surgery so we can keep continuous anticoagulation.  If no surgery, he has scheduled TEE-DCCV tomorrow.  Hopefully decision on surgery/no surgery can be made today.  2. Acute on chronic systolic CHF:  Ischemic cardiomyopathy, he has tolerated minimal cardiac meds at home.  LVEF approximately 15% as of December 2015 at TEE. Does not have ICD (suspect poor candidate given infection risk).  He remains volume overloaded on exam but getting better with HD.  - Think he would benefit from daily HD at this point for volume removal. Right now, think that his BP should tolerate.  3. History of bioprosthetic mitral valve replacement for severe [name]  4. CAD: s/p CABG.  Stable, no chest pain.  On home atorvastatin 20 daily.  5. 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 On vanc and zosyn 6. End-stage renal disease on hemodialysis: As above, needs fluid removal via HD.  7. Pleural Effusions R> L  Length of Stay: 9   [name]  12/07/2014, 10:57 AM  Advanced Heart Failure Team Pager [phone] (M-F; 7a - 4p)  Please contact [facility] for night-coverage after hours (4p -7a ) and weekends on [email or web address]

## 2014-12-17 NOTE — Progress Notes (Addendum)
Per RN Pt just placed back on BiPAP. Per RN Pt will tolerate for and hour or so at a time before asking to take it off for a while.

## 2014-12-17 NOTE — Progress Notes (Signed)
Patient ID: [name] [name], male   DOB: [birth year], 64 y.o.   MRN: [id number]  [facility] Progress Note    Subjective:   No new complaints   Objective:   BP 93/55 mmHg  Pulse 102  Temp(Src) 97.9 F (36.6 C) (Oral)  Resp 23  Ht 6\' 4"  (1.93 m)  Wt 88 kg (194 lb 0.1 oz)  BMI 23.62 kg/m2  SpO2 98%  Intake/Output: I/O last 3 completed shifts: In: 2141.2 [P.O.:480; I.V.:1211.2; IV Piggyback:450] Out: 2659 [Other:2659]   Intake/Output this shift:  Total I/O In: 69.7 [I.V.:69.7] Out: -  Weight change: -2.6 kg (-5 lb 11.7 oz)  Physical Exam: Gen:WD WN AAM in nad OZH:YQMVH Resp:cta QIO:NGEXBM Ext:s/p RAKA, left 4th toe with ischemic changes and some bleeding on the dressing. R AVF +T/B  Labs: BMET  Recent Labs Lab 12/11/14 0522 12/12/14 0250 12/02/2014 0308 12/14/14 0227 12/15/14 0320 12/16/14 0841 12/07/2014 0345  NA 138 138 136 139 141 137 139  K 3.8 3.9 4.4 4.8 3.6 4.2 4.1  CL 100 99 100 102 99 97 100  CO2 27 24 20 20 27 26 24   GLUCOSE 109* 91 124* 128* 103* 109* 95  BUN 17 21 11 22 14 23 16   CREATININE 4.03* 5.16* 3.35* 4.42* 3.34* 4.81* 4.31*  ALBUMIN  --  1.5* 1.5* 1.5* 1.7* 1.5*  --   CALCIUM 7.9* 8.2* 7.8* 8.5 8.6 8.5 8.6  PHOS 2.5  --   --  3.4 2.9 2.9  --    CBC  Recent Labs Lab 11/27/2014 0308 12/14/14 0227 12/15/14 0320 12/14/2014 0345  WBC 22.8* 27.6* 24.1* 23.9*  HGB 9.1* 9.5* 9.1* 9.5*  HCT 27.8* 28.8* 28.0* 29.0*  MCV 82.7 81.1 80.7 79.5  PLT 241 307 281 311    @IMGRELPRIORS @ Medications:    . allopurinol  100 mg Oral Daily  . amiodarone  150 mg Intravenous Once  . antiseptic oral rinse  7 mL Mouth Rinse BID  . atorvastatin  20 mg Oral q1800  . darbepoetin (ARANESP) injection - DIALYSIS  100 mcg Intravenous Q Sat-HD  . famotidine  20 mg Oral Daily  . FLUoxetine  20 mg Oral Daily  . insulin aspart  0-5 Units Subcutaneous QHS  . insulin aspart  0-9 Units Subcutaneous TID WC  . midodrine  10 mg Oral TID WC  .  piperacillin-tazobactam (ZOSYN)  IV  2.25 g Intravenous Q8H  . traZODone  50 mg Oral QHS  . vancomycin  125 mg Oral QID  . [START ON 12/19/2014] vancomycin  1,000 mg Intravenous Q T,Th,Sa-HD  . warfarin  0.5 mg Oral ONCE-1800  . Warfarin - Pharmacist Dosing Inpatient   Does not apply [id number]     Assessment/ Plan:   1. [name] fib with RVR- improved with IV amiodarone, appreciate cards input. Will cont with UF as tolerated with HD. 2. ESRD- normally TTS but off schedule due to the development of CHF in setting of [name] fib with RVR 1. Plan for HD tomorrow and will get back on schedule on Saturday 3. SOB/SSCP- as above, new onset [name] fib with RVR now hypotensive, awaiting cardiology's input to help rate control. 4. PVD- now with ischemic changes to 4th digit on left foot, await eval by Dr. Sharol Given. 5. 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 Nutrition: protein malnutrition- per primary 8. 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 CMP- EF15% difficulty maintaining BP due to  [name] fib/RVR, will be [name] challenge for HD. Poor overall prognosis given multiple end-stage disease processes, may need to consult palliative care to help set goals and limits of care as he is [name] full code 10. Hypotension: on midodrine but still with low BP due to RVR  [name] [name] 12/22/2014, 10:53 AM

## 2014-12-17 NOTE — Progress Notes (Signed)
Echocardiogram 2D Echocardiogram has been performed.  Jalynn, Betzold 12/20/2014, 9:14 AM

## 2014-12-18 ENCOUNTER — Inpatient Hospital Stay (HOSPITAL_COMMUNITY): Payer: Medicare Other | Admitting: Anesthesiology

## 2014-12-18 ENCOUNTER — Encounter (HOSPITAL_COMMUNITY): Payer: Self-pay | Admitting: Anesthesiology

## 2014-12-18 ENCOUNTER — Encounter (HOSPITAL_COMMUNITY): Admission: EM | Disposition: E | Payer: Self-pay | Source: Home / Self Care | Attending: Internal Medicine

## 2014-12-18 DIAGNOSIS — I4891 Unspecified atrial fibrillation: Secondary | ICD-10-CM

## 2014-12-18 DIAGNOSIS — I9589 Other hypotension: Secondary | ICD-10-CM | POA: Diagnosis present

## 2014-12-18 HISTORY — PX: CARDIOVERSION: SHX1299

## 2014-12-18 HISTORY — PX: TEE WITHOUT CARDIOVERSION: SHX5443

## 2014-12-18 LAB — GLUCOSE, CAPILLARY
GLUCOSE-CAPILLARY: 92 mg/dL (ref 70–99)
Glucose-Capillary: 80 mg/dL (ref 70–99)
Glucose-Capillary: 83 mg/dL (ref 70–99)

## 2014-12-18 LAB — PROTIME-INR
INR: 1.6 — ABNORMAL HIGH (ref 0.00–1.49)
Prothrombin Time: 19.2 seconds — ABNORMAL HIGH (ref 11.6–15.2)

## 2014-12-18 LAB — CBC
HCT: 28.1 % — ABNORMAL LOW (ref 39.0–52.0)
Hemoglobin: 9.5 g/dL — ABNORMAL LOW (ref 13.0–17.0)
MCH: 26.1 pg (ref 26.0–34.0)
MCHC: 33.8 g/dL (ref 30.0–36.0)
MCV: 77.2 fL — AB (ref 78.0–100.0)
PLATELETS: 284 10*3/uL (ref 150–400)
RBC: 3.64 MIL/uL — ABNORMAL LOW (ref 4.22–5.81)
RDW: 20.6 % — ABNORMAL HIGH (ref 11.5–15.5)
WBC: 25.2 10*3/uL — AB (ref 4.0–10.5)

## 2014-12-18 LAB — APTT: aPTT: 77 seconds — ABNORMAL HIGH (ref 24–37)

## 2014-12-18 LAB — HEPARIN LEVEL (UNFRACTIONATED)
Heparin Unfractionated: <0.1 IU/mL — ABNORMAL LOW (ref 0.30–0.70)
Heparin Unfractionated: 0.19 IU/mL — ABNORMAL LOW (ref 0.30–0.70)

## 2014-12-18 SURGERY — ECHOCARDIOGRAM, TRANSESOPHAGEAL
Anesthesia: Monitor Anesthesia Care

## 2014-12-18 MED ORDER — PROPOFOL INFUSION 10 MG/ML OPTIME
INTRAVENOUS | Status: DC | PRN
  Administered 2014-12-18: 75 ug/kg/min via INTRAVENOUS

## 2014-12-18 MED ORDER — BUTAMBEN-TETRACAINE-BENZOCAINE 2-2-14 % EX AERO
INHALATION_SPRAY | CUTANEOUS | Status: DC | PRN
  Administered 2014-12-18: 2 via TOPICAL

## 2014-12-18 MED ORDER — HEPARIN (PORCINE) IN NACL 100-0.45 UNIT/ML-% IJ SOLN
2350.0000 [IU]/h | INTRAMUSCULAR | Status: DC
  Administered 2014-12-18 (×2): 2100 [IU]/h via INTRAVENOUS
  Administered 2014-12-19 – 2014-12-20 (×4): 2300 [IU]/h via INTRAVENOUS
  Administered 2014-12-21: 2350 [IU]/h via INTRAVENOUS
  Filled 2014-12-18 (×9): qty 250

## 2014-12-18 MED ORDER — HEPARIN BOLUS VIA INFUSION
2500.0000 [IU] | INTRAVENOUS | Status: AC
  Administered 2014-12-18: 2500 [IU] via INTRAVENOUS
  Filled 2014-12-18: qty 2500

## 2014-12-18 MED ORDER — WARFARIN 0.5 MG HALF TABLET
0.5000 mg | ORAL_TABLET | Freq: Once | ORAL | Status: AC
  Administered 2014-12-18: 0.5 mg via ORAL
  Filled 2014-12-18: qty 1

## 2014-12-18 NOTE — Anesthesia Preprocedure Evaluation (Addendum)
Anesthesia Evaluation  Patient identified by MRN, date of birth, ID band Patient awake    Reviewed: Allergy & Precautions, NPO status , Patient's Chart, lab work & pertinent test results  Airway Mallampati: I  TM Distance: >3 FB Neck ROM: Full    Dental  (+) Poor Dentition   Pulmonary former smoker,    Pulmonary exam normal       Cardiovascular hypertension, Pt. on medications + CAD and +CHF + dysrhythmias Atrial Fibrillation     Neuro/Psych    GI/Hepatic GERD-  Medicated and Controlled,  Endo/Other  diabetes, Type 2, Insulin Dependent  Renal/GU Dialysis and ESRFRenal disease     Musculoskeletal   Abdominal   Peds  Hematology   Anesthesia Other Findings   Reproductive/Obstetrics                           Anesthesia Physical Anesthesia Plan  ASA: III  Anesthesia Plan: MAC   Post-op Pain Management:    Induction: Intravenous  Airway Management Planned: Nasal Cannula  Additional Equipment:   Intra-op Plan:   Post-operative Plan:   Informed Consent: I have reviewed the patients History and Physical, chart, labs and discussed the procedure including the risks, benefits and alternatives for the proposed anesthesia with the patient or authorized representative who has indicated his/her understanding and acceptance.   Dental advisory given  Plan Discussed with: CRNA and Surgeon  Anesthesia Plan Comments:        Anesthesia Quick Evaluation

## 2014-12-18 NOTE — Progress Notes (Signed)
Patient ID: [name] [name], male   DOB: Jun 21, 1951, 64 y.o.   MRN: [id number] Advanced Heart Failure Rounding Note   Subjective:    [name] [name] is a 64 y/o M with CAD s/p CABG in 2008 with bioprosthetic mitral valve replacement at that time, ESRD on HD, prior GI bleed, HTN, HLD, PAF on oral amiodarone/Coumadin (s/p TEE/DCCV in 76/1607), chronic systolic dysfunction (last EF 15% in 07/2014), anemia, R AKA, chronic right loculated pleural effusion s/p chest tube 10/2014 .   Admitted to [facility] with necrotic L foot and A fib RVR. Placed on antibiotics.  He was started on cardizem that was later stopped and he was placed on amiodarone drip after he was evaluated by cardiology.  He transferred to [facility] on April 18 due to increased surgical risk.  BB stopped with hypotension.   He went to OR on 4/17 and had 3rd ray amputation. Over night he developed respiratory distress and placed on increased oxygen and now on BiPap.  CXR with Increased pulmonary edema and R and L pleural effusions --R>L. Hypotensive with SBP in 80s initially.    Stable today, SBP 90s-100s, no pressors.  Not short of breath at rest.  Remains in atrial fibrillation on amiodarone gtt, HR 90s-100s.  No HD yesterday.  Plan for HD today and TEE-DCCV.    Objective:   Weight Range:  Vital Signs:   Temp:  [97.4 F (36.3 C)-98.9 F (37.2 C)] 97.4 F (36.3 C) (04/22 0309) Pulse Rate:  [87-106] 95 (04/22 0309) Resp:  [14-25] 21 (04/22 0309) BP: (89-108)/(55-66) 91/56 mmHg (04/22 0309) SpO2:  [90 %-100 %] 97 % (04/22 0309) FiO2 (%):  [40 %] 40 % (04/21 2353) Weight:  [195 lb 15.8 oz (88.9 kg)] 195 lb 15.8 oz (88.9 kg) (04/22 0309) Last BM Date: 12/14/14  Weight change: Filed Weights   12/16/14 1150 12/07/2014 0500 12/04/2014 0309  Weight: 195 lb 15.8 oz (88.9 kg) 194 lb 0.1 oz (88 kg) 195 lb 15.8 oz (88.9 kg)    Intake/Output:   Intake/Output Summary (Last 24 hours) at 12/22/2014 0826 Last data filed at 11/27/2014 3710  Gross per 24 hour  Intake  1295.73 ml  Output      0 ml  Net 1295.73 ml     Physical Exam: General:  Chronically ill appearing. On BiPAP HEENT: normal Neck: supple. JVP 10 cm.  Carotids 2+ bilat; no bruits. No lymphadenopathy or thryomegaly appreciated. Cor: PMI nondisplaced. Regular rate & rhythm. No rubs, gallops or murmurs. Lungs: Decreased breath sounds RLL LLL Abdomen: soft, nontender, nondistended. No hepatosplenomegaly. No bruits or masses. Good bowel sounds. Extremities: no cyanosis, clubbing, rash, edema R AKA LLE foot dressing in tact Neuro: drowsy but does arouse. On Bipap. Has R AKA. moves all 4 extremities w/o difficulty. Affect pleasant  Telemetry:  A Fib 90s-100s  Labs: Basic Metabolic Panel:  Recent Labs Lab 12/01/2014 0308 12/14/14 0227 12/15/14 0320 12/16/14 0841 12/22/2014 0345  NA 136 139 141 137 139  K 4.4 4.8 3.6 4.2 4.1  CL 100 102 99 97 100  CO2 20 20 27 26 24   GLUCOSE 124* 128* 103* 109* 95  BUN 11 22 14 23 16   CREATININE 3.35* 4.42* 3.34* 4.81* 4.31*  CALCIUM 7.8* 8.5 8.6 8.5 8.6  PHOS  --  3.4 2.9 2.9  --     Liver Function Tests:  Recent Labs Lab 12/12/14 0250 12/03/2014 0308 12/14/14 0227 12/15/14 0320 12/16/14 0841  AST 38* 47*  --   --   --  ALT 18 18  --   --   --   ALKPHOS 116 122*  --   --   --   BILITOT 5.0* 5.1*  --   --   --   PROT 6.3 6.4  --   --   --   ALBUMIN 1.5* 1.5* 1.5* 1.7* 1.5*   No results for input(s): LIPASE, AMYLASE in the last 168 hours. No results for input(s): AMMONIA in the last 168 hours.  CBC:  Recent Labs Lab 11/27/2014 0308 12/14/14 0227 12/15/14 0320 12/15/2014 0345 12/20/2014 0356  WBC 22.8* 27.6* 24.1* 23.9* 25.2*  HGB 9.1* 9.5* 9.1* 9.5* 9.5*  HCT 27.8* 28.8* 28.0* 29.0* 28.1*  MCV 82.7 81.1 80.7 79.5 77.2*  PLT 241 307 281 311 284    Cardiac Enzymes: No results for input(s): CKTOTAL, CKMB, CKMBINDEX, TROPONINI in the last 168 hours.  BNP: BNP (last 3 results) No results for input(s): BNP in the last 8760  hours.  ProBNP (last 3 results)  Recent Labs  08/15/14 0245  PROBNP >70000.0*      Other results:  Imaging: No results found.   Medications:     Scheduled Medications: . allopurinol  100 mg Oral Daily  . amiodarone  150 mg Intravenous Once  . antiseptic oral rinse  7 mL Mouth Rinse BID  . atorvastatin  20 mg Oral q1800  . darbepoetin (ARANESP) injection - DIALYSIS  100 mcg Intravenous Q Sat-HD  . famotidine  20 mg Oral Daily  . FLUoxetine  20 mg Oral Daily  . insulin aspart  0-5 Units Subcutaneous QHS  . insulin aspart  0-9 Units Subcutaneous TID WC  . midodrine  10 mg Oral TID WC  . piperacillin-tazobactam (ZOSYN)  IV  2.25 g Intravenous Q8H  . traZODone  50 mg Oral QHS  . vancomycin  125 mg Oral QID  . [START ON 12/19/2014] vancomycin  1,000 mg Intravenous Q T,Th,Sa-HD  . Warfarin - Pharmacist Dosing Inpatient   Does not apply q1800    Infusions: . amiodarone 30 mg/hr (12/25/2014 0600)  . heparin 2,100 Units/hr (12/09/2014 0619)    PRN Medications: sodium chloride, sodium chloride, acetaminophen, feeding supplement (NEPRO CARB STEADY), heparin, heparin, HYDROcodone-acetaminophen, lidocaine (PF), lidocaine-prilocaine, metoprolol, ondansetron **OR** ondansetron (ZOFRAN) IV, pentafluoroprop-tetrafluoroeth   Assessment/Plan    1. Acute Respiratory Failure: Pulmonary edema with acute/chronic systolic CHF.  Now on nasal cannula.  2. Hypotension: Has history of orthostatic hypotension in setting of ischemic cardiomyopathy and was on midodrine 10 mg twice a day at home. He has had trouble tolerating cardiac meds historically.  Suspect hypotension in the hospital may have been combination of cardiogenic and septic/vasodilatory shock.  BP now better, SBP 90s-100s. - No need for pressors at this point.  - Some degree of hypotension has been a [place]-standing problem for [name] [name].  Would continue midodrine at home dose.     3. Persistent atrial fibrillation with mild RVR: Most  recent DC-CV 07/2014. Back in atrial fibrillation this admission. Continue amiodarone drip for rate control. Stopped beta blocker with concern for low output and low BP.  INR 1.6 today, on heparin gtt and coumadin per pharmacy.  Now that he has had a fair amount of fluid off via HD, I think that he would benefit from TEE-guided DCCV.  Plan for TEE-DCCV today.  Per Dr Sharol Given, if he has any further surgery on his leg, he could continue anticoagulation.  2. Acute on chronic systolic CHF:  Ischemic cardiomyopathy, he has  tolerated minimal cardiac meds at home.  LVEF approximately 15% as of December 2015 at TEE. Does not have ICD (suspect poor candidate given infection risk).  He remains volume overloaded on exam but getting better with HD.  - HD will be done again today.  3. History of bioprosthetic mitral valve replacement for severe [name]  4. CAD: s/p CABG.  Stable, no chest pain.  On home atorvastatin 20 daily.  5. 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 On vanc and zosyn 6. End-stage renal disease on hemodialysis: As above, needs fluid removal via HD.  7. Pleural Effusions R> L 8. C difficile colitis: On po vancomycin.   Length of Stay: [place]  12/17/2014, 8:26 AM  Advanced Heart Failure Team Pager [phone] (M-F; 7a - 4p)  Please contact [facility] for night-coverage after hours (4p -7a ) and weekends on [email or web address]

## 2014-12-18 NOTE — Procedures (Signed)
Patient was seen on dialysis and the procedure was supervised. BFR 400 Via RAVF BP is 100/55.  Patient appears to be tolerating treatment well

## 2014-12-18 NOTE — Progress Notes (Signed)
Medicare Important Message given? YES (If response is "NO", the following Medicare IM given date fields will be blank) Date Medicare IM given:12/01/2014 Medicare IM given by: Iran Kievit 

## 2014-12-18 NOTE — Progress Notes (Signed)
ANTICOAGULATION/ANTIBIOTIC CONSULT NOTE  Pharmacy Consult for Warfarin, heparin Indication: atrial fibrillation  Allergies  Allergen Reactions  . Bacitracin Other (See Comments)    unknown  . Norvasc [Amlodipine Besylate] Other (See Comments)    unknown    Patient Measurements: Height: 6\' 4"  (193 cm) Weight: 197 lb 15.6 oz (89.8 kg) IBW/kg (Calculated) : 86.8   Vital Signs: Temp: 97.8 F (36.6 C) (04/22 1155) Temp Source: Oral (04/22 1155) BP: 102/60 mmHg (04/22 1322) Pulse Rate: 83 (04/22 1322)  Labs:  Recent Labs  12/16/14 0305 12/16/14 0841  12/13/2014 0345  12/01/2014 1701 12/26/2014 0356 12/22/2014 0500 12/14/2014 1230  HGB  --   --   --  9.5*  --   --  9.5*  --   --   HCT  --   --   --  29.0*  --   --  28.1*  --   --   PLT  --   --   --  311  --   --  284  --   --   APTT  --   --   --   --   --   --  77*  --   --   LABPROT 18.6*  --   --  18.2*  --   --  19.2*  --   --   INR 1.53*  --   --  1.49  --   --  1.60*  --   --   HEPARINUNFRC  --   --   < >  --   < > <0.10*  --  <0.10* 0.19*  CREATININE  --  4.81*  --  4.31*  --   --   --   --   --   < > = values in this interval not displayed.  Estimated Creatinine Clearance: 21 mL/min (by C-G formula based on Cr of 4.31).   Medical History: Past Medical History  Diagnosis Date  . UGI bleed 02/12/11    On anticoagulation; EGD w/snare polypectomy-multiple polypoid lesions antrum(benign), Chronic active gastritis (NEGATIVE H pylori)  . Anemia, chronic disease   . Sepsis due to enterococcus 02/11/11  . Paroxysmal atrial fibrillation     On coumadin  . Coronary atherosclerosis of native coronary artery     a. Multivessel status post CABG 2008.  . Cardiomyopathy   . Type 2 diabetes mellitus   . Essential hypertension, benign   . Hyperlipemia   . Gout   . S/P patent foramen ovale closure   . Hyperbilirubinemia 08/10/2013  . SBO (small bowel obstruction) 01/2014  . 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 Clostridium  difficile colitis 01/2014  . Diverticulosis   . Internal hemorrhoids   . ESRD on hemodialysis     Started 2008-2009. Dr Hinda Lenis on a TTS schedule - R forearm AVF for several years    . Orthostatic hypotension   . S/P mitral valve replacement with bioprosthetic valve     a. 2008 at time of CABG.  . Chronic systolic CHF (congestive heart failure)     a. Last EF 15% in 07/2014.  Marland Kitchen Loculated pleural effusion     a. chronic right loculated pleural effusion s/p chest tube 10/2014.  Marland Kitchen RBBB     a. Intermittent.     Medications:  Scheduled:  . allopurinol  100 mg Oral Daily  . amiodarone  150 mg Intravenous Once  . antiseptic oral rinse  7 mL Mouth Rinse  BID  . atorvastatin  20 mg Oral q1800  . darbepoetin (ARANESP) injection - DIALYSIS  100 mcg Intravenous Q Sat-HD  . famotidine  20 mg Oral Daily  . FLUoxetine  20 mg Oral Daily  . insulin aspart  0-5 Units Subcutaneous QHS  . insulin aspart  0-9 Units Subcutaneous TID WC  . midodrine  10 mg Oral TID WC  . piperacillin-tazobactam (ZOSYN)  IV  2.25 g Intravenous Q8H  . traZODone  50 mg Oral QHS  . vancomycin  125 mg Oral QID  . [START ON 12/19/2014] vancomycin  1,000 mg Intravenous Q T,Th,Sa-HD  . warfarin  0.5 mg Oral ONCE-1800  . Warfarin - Pharmacist Dosing Inpatient   Does not apply q1800    Assessment: 21 yoM on coumadin PTA for hx Afib. Coumadin was held since 4/15 pending surgery. S/P Fem-pop surgery on 4/17. Pharmacy consulted to restart coumadin but no doses given as INR was elevated.  Vitamin K 10 mg IV given 4/19.  INR today 1.60  Continues on heparin bridge. Heparin level remains subtherapeutic but is trending up on higher doses of heparin.    Documented Home Dose: 0.5 mg daily except on Sunday and Wednesday, no coumadin.   Goal of Therapy:  Heparin level 0.3-0.7 units/ml INR 2-3 Monitor platelets by anticoagulation protocol: Yes   Plan:  Increase heparin to 2300 units/hr Check HL ~8 hours after rate  increase Coumadin 0.5 mg po today Daily HL/PT/INR Monitor for signs of bleeding  Thanks for allowing pharmacy to be a part of this patient's care.  Excell Seltzer, PharmD Clinical Pharmacist, [phone] 4/22/20161:40 PM

## 2014-12-18 NOTE — Progress Notes (Signed)
[place] TEAM 1 - Stepdown/ICU TEAM Progress Note  [name] [name]:[id number] DOB: [birth year] DOA: 12/19/2014 PCP: [name], MD  Admit HPI / Brief Narrative: 64 year old BM PMHx CAD status post CABG 2008 with mitral valve replacement (bioprostetic), diabetes mellitus, atrial fibrillation on Coumadin and amiodarone status post cardioversion in 2015, ESRD Tu/Th/Sat, chronic systolic heart failure with an EF of 15% in 2015, right AKA, and a right chronic loculated pleural effusion. Came to [facility] for left foot pain and was found to have cellulitis and gangrene with severe sepsis (lactic acid of 6).   MRI showed deep ulceration but no evidence of osteo. ABIs showed a heavily diseased popliteal artery with occlusion in the mid segmentand very limited flow within the runoff vessels.  On 04/14 his A. fib RVR became hard to control andhe was started on IV amiodarone and Cardiology was consulted. 04/14 after after dialysis he became hypotensive and had to be given multiple boluses of normal saline. Cardiology and IM deemed him high risk of surgery and he was therefore transferred to [facility].  HPI/Subjective: 4/22 A/O 4, states is aware that he may have one or more toes amputated.  Assessment/Plan:  Left lower extremity cellulitis with gangrene and ulceration / necrotic 3rd toe -Orthopedics following - status post I and D of left foot with third ray amputation  - continue empiric antibiotics for now as significant purulent material was encountered during the I&D  - Further surgery by orthopedic surgery scheduled for 4/23?   Cardiomyopathy / severe chronic systolic congestive heart failure EF 15% December 2015 TEE -Acute decompensation with pulmonary edema due to volume expansion in attempt to treat RVR  -Daily weights; on admission= 85.9 kg     4/22 weight= 88.9 kg -Strict I&O; since admission + 6.75 L - not a candidate for ACE inhibitor due to baseline hypertension  -  baseline weight~80 kg;  Persistent atrial fibrillation/flutter with RVR  -Remains on amiodarone drip; heart rate proving very difficult to control  - 4/22 S/P DCCV  Chronic hypotension -Blood pressure with slight improvement, continue monitor closely   End-stage renal disease on hemodialysis - Tu/Th/Sat  -Nephrology following  -HD per nephrology; -Continue Midrin 10 mg TID  CAD Multivessel status post CABG x4 2008 -Cardiology following - not a candidate for beta blockers or nitrates due to baseline hypotension  History of bioprosthetic mitral valve replacement 2008 with pericardial valve for severe MR  -Does not require anticoagulation as is a tissue valve   C. difficile colitis -Continue oral vancomycin as per C. difficile treatment protocol, no evidence at present of acute complications  Type 2 diabetes mellitus with nephropathy -CBGs currently well controlled    Code Status: FULL Family Communication: no family present at time of exam Disposition Plan: CIR vs SNF    Consultants: Dr.Joseph Coladonato [facility]  Dr.Christopher [facility]  CHF Team   Procedure/Significant Events: 4/17 I &.D. Lt foot including excision/ debridement of necrotic skin and fascia.- Lt foot third ray amputation. 4/22 S/P DCCV  Culture  Antibiotics: Zosyn 4/12 > Vanc 4/12 > Oral Vanc 4/12 >   DVT prophylaxis: SCDs + warfarin   Devices    LINES / TUBES:      Continuous Infusions: . amiodarone 30 mg/hr (12/16/2014 1231)  . heparin 2,300 Units/hr (12/05/2014 1755)    Objective: VITAL SIGNS: Temp: 98.1 F (36.7 C) (04/22 1739) Temp Source: Oral (04/22 1739) BP: 92/47 mmHg (04/22 1700) Pulse Rate: 73 (  04/22 1700) SPO2; FIO2:   Intake/Output Summary (Last 24 hours) at 12/09/2014 1831 Last data filed at 12/09/2014 1231  Gross per 24 hour  Intake 1030.93 ml  Output      0 ml  Net 1030.93 ml     Exam: General: A/O 4,  mild acute respiratory distress Lungs: Clear to auscultation bilaterally without wheezes or crackles Cardiovascular: Tachycardic, Regular rhythm without murmur gallop or rub normal S1 and S2 Abdomen: Nontender, nondistended, soft, bowel sounds positive, no rebound, no ascites, no appreciable mass Extremities: No significant cyanosis, clubbing. Left foot covered and clean, mild serosanguineous fluid draining, negative purulent drainage; toes cold, blue however patient able to move all toes on command. Patient's third left metatarsal black cold to touch, unable to move on command. Right AKA negative ulcerations well-healed scar.  Data Reviewed: Basic Metabolic Panel:  Recent Labs Lab 12/12/2014 0308 12/14/14 0227 12/15/14 0320 12/16/14 0841 12/01/2014 0345  NA 136 139 141 137 139  K 4.4 4.8 3.6 4.2 4.1  CL 100 102 99 97 100  CO2 20 20 27 26 24   GLUCOSE 124* 128* 103* 109* 95  BUN 11 22 14 23 16   CREATININE 3.35* 4.42* 3.34* 4.81* 4.31*  CALCIUM 7.8* 8.5 8.6 8.5 8.6  PHOS  --  3.4 2.9 2.9  --    Liver Function Tests:  Recent Labs Lab 12/12/14 0250 12/19/2014 0308 12/14/14 0227 12/15/14 0320 12/16/14 0841  AST 38* 47*  --   --   --   ALT 18 18  --   --   --   ALKPHOS 116 122*  --   --   --   BILITOT 5.0* 5.1*  --   --   --   PROT 6.3 6.4  --   --   --   ALBUMIN 1.5* 1.5* 1.5* 1.7* 1.5*   No results for input(s): LIPASE, AMYLASE in the last 168 hours. No results for input(s): AMMONIA in the last 168 hours. CBC:  Recent Labs Lab 12/15/2014 0308 12/14/14 0227 12/15/14 0320 11/30/2014 0345 12/19/2014 0356  WBC 22.8* 27.6* 24.1* 23.9* 25.2*  HGB 9.1* 9.5* 9.1* 9.5* 9.5*  HCT 27.8* 28.8* 28.0* 29.0* 28.1*  MCV 82.7 81.1 80.7 79.5 77.2*  PLT 241 307 281 311 284   Cardiac Enzymes: No results for input(s): CKTOTAL, CKMB, CKMBINDEX, TROPONINI in the last 168 hours. BNP (last 3 results) No results for input(s): BNP in the last 8760 hours.  ProBNP (last 3 results)  Recent Labs   08/15/14 0245  PROBNP >70000.0*    CBG:  Recent Labs Lab 12/16/2014 1137 11/30/2014 1725 12/20/2014 2138 12/09/2014 0818 12/22/2014 1738  GLUCAP 117* 106* 103* 92 80    Recent Results (from the past 240 hour(s))  MRSA PCR Screening     Status: None   Collection Time: 12/03/2014  6:40 PM  Result Value Ref Range Status   MRSA by PCR NEGATIVE NEGATIVE Final    Comment:        The GeneXpert MRSA Assay (FDA approved for NASAL specimens only), is one component of a comprehensive MRSA colonization surveillance program. It is not intended to diagnose MRSA infection nor to guide or monitor treatment for MRSA infections.   Clostridium Difficile by PCR     Status: Abnormal   Collection Time: 12/11/14 10:12 AM  Result Value Ref Range Status   C difficile by pcr POSITIVE (A) NEGATIVE Final    Comment: CRITICAL RESULT CALLED TO, READ BACK BY AND VERIFIED WITH:  S.HEATH AT 7829 ON 12/11/14 BY S.VANHOORNE      Studies:  Recent x-ray studies have been reviewed in detail by the Attending Physician  Scheduled Meds:  Scheduled Meds: . allopurinol  100 mg Oral Daily  . amiodarone  150 mg Intravenous Once  . antiseptic oral rinse  7 mL Mouth Rinse BID  . atorvastatin  20 mg Oral q1800  . darbepoetin (ARANESP) injection - DIALYSIS  100 mcg Intravenous Q Sat-HD  . famotidine  20 mg Oral Daily  . FLUoxetine  20 mg Oral Daily  . insulin aspart  0-5 Units Subcutaneous QHS  . insulin aspart  0-9 Units Subcutaneous TID WC  . midodrine  10 mg Oral TID WC  . piperacillin-tazobactam (ZOSYN)  IV  2.25 g Intravenous Q8H  . traZODone  50 mg Oral QHS  . vancomycin  125 mg Oral QID  . [START ON 12/19/2014] vancomycin  1,000 mg Intravenous Q T,Th,Sa-HD  . Warfarin - Pharmacist Dosing Inpatient   Does not apply q1800    Time spent on care of this patient: 40 mins   WOODS, Geraldo Docker , MD  [facility] Office  [phone] Pager - [phone]  On-Call/Text Page:      [email or web address]       password [email or web address]  If 7PM-7AM, please contact night-coverage [email or web address] Password [facility] 12/12/2014, 6:31 PM   LOS: 10 days   Care during the described time interval was provided by me .  I have reviewed this patient's available data, including medical history, events of note, physical examination, radiology studies and test results as part of my evaluation  Dia Crawford, MD [phone] Pager

## 2014-12-18 NOTE — Progress Notes (Signed)
BIPAP not needed at this time 

## 2014-12-18 NOTE — Progress Notes (Signed)
RT went to check patient's bipap. Patient downstairs in procedure. RT will continue to monitor.

## 2014-12-18 NOTE — Anesthesia Postprocedure Evaluation (Signed)
Anesthesia Post Note  Patient: [name] [name]  Procedure(s) Performed: Procedure(s) (LRB): TRANSESOPHAGEAL ECHOCARDIOGRAM (TEE) (N/A) CARDIOVERSION (N/A)  Anesthesia type: general  Patient location: PACU  Post pain: Pain level controlled  Post assessment: Patient's Cardiovascular Status Stable  Last Vitals:  Filed Vitals:   12/21/2014 1252  BP: 101/63  Pulse: 35  Temp:   Resp: 24    Post vital signs: Reviewed and stable  Level of consciousness: sedated  Complications: No apparent anesthesia complications

## 2014-12-18 NOTE — Procedures (Signed)
Electrical Cardioversion Procedure Note [name] [name] [id number] December 14, 1950  Procedure: Electrical Cardioversion Indications:  Atrial Fibrillation.  He has been on heparin gtt, TEE showed no thrombus.   Procedure Details Consent: Risks of procedure as well as the alternatives and risks of each were explained to the (patient/caregiver).  Consent for procedure obtained. Time Out: Verified patient identification, verified procedure, site/side was marked, verified correct patient position, special equipment/implants available, medications/allergies/relevent history reviewed, required imaging and test results available.  Performed  Patient placed on cardiac monitor, pulse oximetry, supplemental oxygen as necessary.  Sedation given: Propofol per anesthesiology Pacer pads placed anterior and posterior chest.  Cardioverted 1 time(s).  Cardioverted at [place].  Evaluation Findings: Post procedure EKG shows: NSR with PACs. P waves are low voltage.  Complications: None Patient did tolerate procedure well.   [name] [name] 12/25/2014, 11:06 AM

## 2014-12-18 NOTE — Progress Notes (Signed)
ANTICOAGULATION/ANTIBIOTIC CONSULT NOTE  Pharmacy Consult for Warfarin, heparin Indication: atrial fibrillation  Allergies  Allergen Reactions  . Bacitracin Other (See Comments)    unknown  . Norvasc [Amlodipine Besylate] Other (See Comments)    unknown    Patient Measurements: Height: 6\' 4"  (193 cm) Weight: 195 lb 15.8 oz (88.9 kg) IBW/kg (Calculated) : 86.8   Vital Signs: Temp: 97.4 F (36.3 C) (04/22 0309) Temp Source: Oral (04/22 0309) BP: 91/56 mmHg (04/22 0309) Pulse Rate: 95 (04/22 0309)  Labs:  Recent Labs  12/16/14 0305 12/16/14 0841  11/28/2014 0345 12/16/2014 0650 12/16/2014 1701 12/12/2014 0356 12/25/2014 0500  HGB  --   --   --  9.5*  --   --  9.5*  --   HCT  --   --   --  29.0*  --   --  28.1*  --   PLT  --   --   --  311  --   --  284  --   LABPROT 18.6*  --   --  18.2*  --   --  19.2*  --   INR 1.53*  --   --  1.49  --   --  1.60*  --   HEPARINUNFRC  --   --   < >  --  0.27* <0.10*  --  <0.10*  CREATININE  --  4.81*  --  4.31*  --   --   --   --   < > = values in this interval not displayed.  Estimated Creatinine Clearance: 21 mL/min (by C-G formula based on Cr of 4.31).   Medical History: Past Medical History  Diagnosis Date  . UGI bleed 02/12/11    On anticoagulation; EGD w/snare polypectomy-multiple polypoid lesions antrum(benign), Chronic active gastritis (NEGATIVE H pylori)  . Anemia, chronic disease   . Sepsis due to enterococcus 02/11/11  . Paroxysmal atrial fibrillation     On coumadin  . Coronary atherosclerosis of native coronary artery     a. Multivessel status post CABG 2008.  . Cardiomyopathy   . Type 2 diabetes mellitus   . Essential hypertension, benign   . Hyperlipemia   . Gout   . S/P patent foramen ovale closure   . Hyperbilirubinemia 08/10/2013  . SBO (small bowel obstruction) 01/2014  . 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 Clostridium difficile colitis 01/2014  . Diverticulosis   . Internal hemorrhoids   . ESRD on  hemodialysis     Started 2008-2009. Dr Hinda Lenis on a TTS schedule - R forearm AVF for several years    . Orthostatic hypotension   . S/P mitral valve replacement with bioprosthetic valve     a. 2008 at time of CABG.  . Chronic systolic CHF (congestive heart failure)     a. Last EF 15% in 07/2014.  Marland Kitchen Loculated pleural effusion     a. chronic right loculated pleural effusion s/p chest tube 10/2014.  Marland Kitchen RBBB     a. Intermittent.     Medications:  Scheduled:  . allopurinol  100 mg Oral Daily  . amiodarone  150 mg Intravenous Once  . antiseptic oral rinse  7 mL Mouth Rinse BID  . atorvastatin  20 mg Oral q1800  . darbepoetin (ARANESP) injection - DIALYSIS  100 mcg Intravenous Q Sat-HD  . famotidine  20 mg Oral Daily  . FLUoxetine  20 mg Oral Daily  . insulin aspart  0-5 Units Subcutaneous QHS  .  insulin aspart  0-9 Units Subcutaneous TID WC  . midodrine  10 mg Oral TID WC  . piperacillin-tazobactam (ZOSYN)  IV  2.25 g Intravenous Q8H  . traZODone  50 mg Oral QHS  . vancomycin  125 mg Oral QID  . [START ON 12/19/2014] vancomycin  1,000 mg Intravenous Q T,Th,Sa-HD  . Warfarin - Pharmacist Dosing Inpatient   Does not apply q1800    Assessment: 40 yoM on coumadin PTA for hx Afib. Coumadin was held since 4/15 pending surgery. S/P Fem-pop surgery on 4/17. Pharmacy consulted to restart coumadin but no doses given as INR was elevated.  Vitamin K 10 mg IV given 4/19.  INR today 1.60  Continues on heparin bridge. Heparin level this AM has dropped to <0.1 units/ml on heparin drip rate 1850 units/hr. RN reports that heparin has been infusing appropriately and IV site is good. No bleeding noted.   Documented Home Dose: 0.5 mg daily except on Sunday and Wednesday, no coumadin.   Goal of Therapy:  preHD vanc level 20-25 mcg/ml Heparin level 0.3-0.7 units/ml INR 2-3 Monitor platelets by anticoagulation protocol: Yes   Plan:  Bolus heparin 2500 units Increase heparin to 2100 units/hr Check HL 6  hours after rate increase Daily HL/PT/INR Monitor for signs of bleeding  Nicole Cella, RPh Clinical Pharmacist Pager: [phone] 4/22/20166:02 AM

## 2014-12-18 NOTE — Progress Notes (Signed)
  Echocardiogram Echocardiogram Transesophageal has been performed.  Johny Chess 12/14/2014, 11:04 AM

## 2014-12-18 NOTE — CV Procedure (Signed)
Procedure: TEE  Indication: Atrial fibrillation  Sedation: Propofol per anesthesiology  Findings: Please see echo section for full report.  Normal LV with mild LV hypertrophy.  EF 25-30%, diffuse hypokinesis appears worse in the septum.  The RV was mildly dilated with moderately decreased systolic function.  There was a bioprosthetic mitral valve, 1 leaflet was restricted by calcification. There was trivial MR, probably no significant mitral stenosis.  Trileaflet aortic valve with no stenosis or regurgitation.  Trivial TR.  Moderate LAE with smoke but no thrombus in LA.  The LA appendage appears to have been surgically ligated.  Mild RAE.  No ASD/PFO noted. Normal caliber thoracic aorta with mild to moderate plaque in descending thoracic aorta.   [name] 12/21/2014 11:05 AM

## 2014-12-18 NOTE — Transfer of Care (Signed)
Immediate Anesthesia Transfer of Care Note  Patient: [name] [name]  Procedure(s) Performed: Procedure(s): TRANSESOPHAGEAL ECHOCARDIOGRAM (TEE) (N/A) CARDIOVERSION (N/A)  Patient Location: Endoscopy Unit  Anesthesia Type:MAC  Level of Consciousness: awake, alert  and oriented  Airway & Oxygen Therapy: Patient Spontanous Breathing and Patient connected to nasal cannula oxygen  Post-op Assessment: Report given to RN and Post -op Vital signs reviewed and stable  Post vital signs: Reviewed and stable  Last Vitals:  Filed Vitals:   12/22/2014 0938  BP: 103/64  Pulse:   Temp: 36.4 C  Resp: 18    Complications: No apparent anesthesia complications

## 2014-12-19 DIAGNOSIS — E1140 Type 2 diabetes mellitus with diabetic neuropathy, unspecified: Secondary | ICD-10-CM

## 2014-12-19 DIAGNOSIS — E876 Hypokalemia: Secondary | ICD-10-CM

## 2014-12-19 DIAGNOSIS — T8209XS Other mechanical complication of heart valve prosthesis, sequela: Secondary | ICD-10-CM

## 2014-12-19 LAB — HEPARIN LEVEL (UNFRACTIONATED)
HEPARIN UNFRACTIONATED: 0.37 [IU]/mL (ref 0.30–0.70)
Heparin Unfractionated: 0.24 IU/mL — ABNORMAL LOW (ref 0.30–0.70)
Heparin Unfractionated: 0.38 IU/mL (ref 0.30–0.70)

## 2014-12-19 LAB — GLUCOSE, CAPILLARY
GLUCOSE-CAPILLARY: 110 mg/dL — AB (ref 70–99)
GLUCOSE-CAPILLARY: 105 mg/dL — AB (ref 70–99)
GLUCOSE-CAPILLARY: 111 mg/dL — AB (ref 70–99)
Glucose-Capillary: 83 mg/dL (ref 70–99)

## 2014-12-19 LAB — CBC
HEMATOCRIT: 26.9 % — AB (ref 39.0–52.0)
Hemoglobin: 9.1 g/dL — ABNORMAL LOW (ref 13.0–17.0)
MCH: 25.8 pg — ABNORMAL LOW (ref 26.0–34.0)
MCHC: 33.8 g/dL (ref 30.0–36.0)
MCV: 76.2 fL — ABNORMAL LOW (ref 78.0–100.0)
PLATELETS: 268 10*3/uL (ref 150–400)
RBC: 3.53 MIL/uL — ABNORMAL LOW (ref 4.22–5.81)
RDW: 21.4 % — ABNORMAL HIGH (ref 11.5–15.5)
WBC: 27.4 10*3/uL — ABNORMAL HIGH (ref 4.0–10.5)

## 2014-12-19 LAB — APTT: aPTT: >200 seconds (ref 24–37)

## 2014-12-19 LAB — BASIC METABOLIC PANEL
ANION GAP: 15 (ref 5–15)
BUN: 10 mg/dL (ref 6–23)
CHLORIDE: 95 mmol/L — AB (ref 96–112)
CO2: 26 mmol/L (ref 19–32)
Calcium: 8.5 mg/dL (ref 8.4–10.5)
Creatinine, Ser: 3.21 mg/dL — ABNORMAL HIGH (ref 0.50–1.35)
GFR calc non Af Amer: 19 mL/min — ABNORMAL LOW (ref >90)
GFR, EST AFRICAN AMERICAN: 22 mL/min — AB (ref >90)
Glucose, Bld: 83 mg/dL (ref 70–99)
POTASSIUM: 3.1 mmol/L — AB (ref 3.5–5.1)
SODIUM: 136 mmol/L (ref 135–145)

## 2014-12-19 LAB — MAGNESIUM: MAGNESIUM: 1.8 mg/dL (ref 1.5–2.5)

## 2014-12-19 LAB — PROTIME-INR
INR: 1.83 — AB (ref 0.00–1.49)
Prothrombin Time: 21.4 seconds — ABNORMAL HIGH (ref 11.6–15.2)

## 2014-12-19 MED ORDER — DARBEPOETIN ALFA 100 MCG/0.5ML IJ SOSY
PREFILLED_SYRINGE | INTRAMUSCULAR | Status: AC
  Filled 2014-12-19: qty 0.5

## 2014-12-19 MED ORDER — WARFARIN 0.5 MG HALF TABLET
0.5000 mg | ORAL_TABLET | Freq: Once | ORAL | Status: AC
  Administered 2014-12-19: 0.5 mg via ORAL
  Filled 2014-12-19: qty 1

## 2014-12-19 MED ORDER — POTASSIUM CHLORIDE 10 MEQ/100ML IV SOLN
10.0000 meq | INTRAVENOUS | Status: DC
Start: 2014-12-19 — End: 2014-12-19
  Administered 2014-12-19 (×2): 10 meq via INTRAVENOUS
  Filled 2014-12-19: qty 100

## 2014-12-19 MED ORDER — MIDODRINE HCL 5 MG PO TABS
ORAL_TABLET | ORAL | Status: AC
  Filled 2014-12-19: qty 2

## 2014-12-19 MED ORDER — SODIUM CHLORIDE 0.9 % IV BOLUS (SEPSIS)
200.0000 mL | Freq: Once | INTRAVENOUS | Status: AC
  Administered 2014-12-19: 200 mL via INTRAVENOUS

## 2014-12-19 NOTE — Progress Notes (Signed)
Patient Name: [name] [name] Date of Encounter: 12/19/2014     Active Problems:   Atrial fibrillation with RVR   [place] term current use of anticoagulant therapy   ESRD (end stage renal disease) on dialysis   Recurrent pleural effusion on right   Cellulitis of left foot   Sepsis   Chronic systolic CHF (congestive heart failure)   History of mitral valve replacement with bioprosthetic valve   Acute on chronic systolic CHF (congestive heart failure)   Cardiomyopathy   Chronic atrial fibrillation   Typical atrial flutter   End-stage renal disease on hemodialysis   Bioprosthetic mitral valve replacement, current hospitalization   C. difficile colitis   Type 2 diabetes with nephropathy   Toe gangrene   Cellulitis of left lower extremity   Congestive dilated cardiomyopathy   Persistent atrial fibrillation   Hypotension, chronic   CAD in native artery   Other specified hypotension    SUBJECTIVE  The patient is maintaining normal sinus rhythm today after having a TEE cardioversion yesterday by Dr. Aundra Dubin.  No chest pain or shortness of breath.  He remains on IV heparin and on IV amiodarone  CURRENT MEDS . allopurinol  100 mg Oral Daily  . antiseptic oral rinse  7 mL Mouth Rinse BID  . atorvastatin  20 mg Oral q1800  . darbepoetin (ARANESP) injection - DIALYSIS  100 mcg Intravenous Q Sat-HD  . famotidine  20 mg Oral Daily  . FLUoxetine  20 mg Oral Daily  . insulin aspart  0-5 Units Subcutaneous QHS  . insulin aspart  0-9 Units Subcutaneous TID WC  . midodrine  10 mg Oral TID WC  . piperacillin-tazobactam (ZOSYN)  IV  2.25 g Intravenous Q8H  . traZODone  50 mg Oral QHS  . vancomycin  125 mg Oral QID  . vancomycin  1,000 mg Intravenous Q T,Th,Sa-HD  . warfarin  0.5 mg Oral ONCE-1800  . Warfarin - Pharmacist Dosing Inpatient   Does not apply q1800    OBJECTIVE  Filed Vitals:   12/19/14 0329 12/19/14 0700 12/19/14 0800 12/19/14 1115  BP:   99/56 108/64  Pulse:   71 74    Temp:  98.6 F (37 C)  98.1 F (36.7 C)  TempSrc:  Oral    Resp:   25 18  Height:      Weight: 194 lb 14.2 oz (88.4 kg)     SpO2:   93% 98%    Intake/Output Summary (Last 24 hours) at 12/19/14 1203 Last data filed at 12/19/14 1128  Gross per 24 hour  Intake 1725.02 ml  Output      0 ml  Net 1725.02 ml   Filed Weights   12/07/2014 1252 11/27/2014 1700 12/19/14 0329  Weight: 197 lb 15.6 oz (89.8 kg) 192 lb 14.4 oz (87.5 kg) 194 lb 14.2 oz (88.4 kg)    PHYSICAL EXAM  General: Pleasant, NAD.  In no acute distress.  Nasal oxygen in place Neuro: Alert and oriented X 3. Moves all extremities spontaneously. Psych: Normal affect. HEENT:  Normal  Neck: Supple without bruits or JVD. Lungs:  Decreased breath sounds at bases Heart: RRR no s3, s4, or murmurs. Abdomen: Soft, non-tender, non-distended, BS + x 4.   Accessory Clinical Findings  CBC  Recent Labs  12/10/2014 0356 12/19/14 0250  WBC 25.2* 27.4*  HGB 9.5* 9.1*  HCT 28.1* 26.9*  MCV 77.2* 76.2*  PLT 284 295   Basic Metabolic Panel  Recent Labs  12/10/2014 0345 12/19/14 0250  NA 139 136  K 4.1 3.1*  CL 100 95*  CO2 24 26  GLUCOSE 95 83  BUN 16 10  CREATININE 4.31* 3.21*  CALCIUM 8.6 8.5   Liver Function Tests No results for input(s): AST, ALT, ALKPHOS, BILITOT, PROT, ALBUMIN in the last 72 hours. No results for input(s): LIPASE, AMYLASE in the last 72 hours. Cardiac Enzymes No results for input(s): CKTOTAL, CKMB, CKMBINDEX, TROPONINI in the last 72 hours. BNP Invalid input(s): POCBNP D-Dimer No results for input(s): DDIMER in the last 72 hours. Hemoglobin A1C No results for input(s): HGBA1C in the last 72 hours. Fasting Lipid Panel No results for input(s): CHOL, HDL, LDLCALC, TRIG, CHOLHDL, LDLDIRECT in the last 72 hours. Thyroid Function Tests No results for input(s): TSH, T4TOTAL, T3FREE, THYROIDAB in the last 72 hours.  Invalid input(s): FREET3  TELE  Normal sinus rhythm.  P waves are difficult  to see.    Radiology/Studies  Mr Foot Left Wo Contrast  12/09/2014   CLINICAL DATA:  Disc pain and swelling of the left third toe with a draining wound on the plantar surface.  EXAM: MRI OF THE LEFT FOREFOOT WITHOUT CONTRAST  TECHNIQUE: Multiplanar, multisequence MR imaging was performed. No intravenous contrast was administered.  COMPARISON:  Plain films of the left foot 12/21/2014.  FINDINGS: A large ulceration is seen on the plantar surface of the foot at the level of the second and third MTP joints. No underlying abscess is identified. No bone marrow signal abnormality to suggest osteomyelitis is identified. The ulceration appears to extend almost bone at the third MTP joint. Intrinsic musculature of the foot demonstrates atrophy. Mild subcutaneous edema is seen over the dorsum of the foot.  IMPRESSION: Deep ulceration on the plantar surface of the foot centered at the level of the third MTP joint without underlying abscess or osteomyelitis.   Electronically Signed   By: Inge Rise M.D.   On: 12/09/2014 16:13   [place] Lower Ext Art Left  12/10/2014   CLINICAL DATA:  64 year old male with decreased pulses and right above the knee amputation.  EXAM: UNILATERAL RIGHT LOWER EXTREMITY ARTERIAL DUPLEX SCAN  TECHNIQUE: Gray-scale sonography as well as color Doppler and duplex ultrasound was performed to evaluate the arteries of the lower extremity.  COMPARISON:  Prior CT pelvis 09/09/2014  FINDINGS: Sonographic interrogation of the right lower extremity vessels demonstrates severe ulcerated, irregular and calcified plaque beginning in the common femoral artery and extending to the ankle. Biphasic pulses are present within the common femoral and superficial femoral arteries. There is no evidence of focal stenosis. The deep femoral artery is patent.  The popliteal artery is heavily diseased and likely occluded for a short segment. Minimal flow is present within the anterior tibial and posterior tibial  arteries.  IMPRESSION: 1. Heavily diseased popliteal artery with occlusion in the mid segment. 2. Very limited flow within the runoff vessels likely via collateral flow. 3. Irregular and calcified plaque throughout the common femoral and superficial femoral arteries without focal stenosis. 4. The profunda femoral artery remains patent. Signed,  Criselda Peaches, MD  [facility]  [facility]   Electronically Signed   By: Jacqulynn Cadet M.D.   On: 12/10/2014 15:40   Dg Chest Portable 1 View  12/14/2014   CLINICAL DATA:  CHF, shortness of breath.  EXAM: PORTABLE CHEST - 1 VIEW  COMPARISON:  12/20/2014  FINDINGS: Partially loculated right pleural effusion including an upper paramediastinal component,  similar. Small left pleural effusion. Interstitial prominence. Cardiomediastinal contours partially obscured though aligned over those superior large. Central vascular congestion. No pneumothorax. Median sternotomy. Mitral valve replacement. CABG.  IMPRESSION: Enlarged cardiac silhouette with central vascular congestion and similar to mildly increased pulmonary edema pattern.  Right greater than left pleural effusions, loculated on the right, similar to prior.   Electronically Signed   By: Carlos Levering M.D.   On: 12/14/2014 04:16   Dg Chest Port 1 View  11/30/2014   CLINICAL DATA:  Renal failure ; anemia  EXAM: PORTABLE CHEST - 1 VIEW  COMPARISON:  November 18, 2014 chest radiograph and chest CT November 09, 2014  FINDINGS: There is extensive airspace consolidation and loculated effusion throughout much of the right hemithorax, stable. There is underlying generalized interstitial edema. There is cardiomegaly with pulmonary venous hypertension. Patient is status post median sternotomy. No bone lesions are appreciable.  IMPRESSION: Underlying congestive heart failure. Extensive loculated effusion and airspace consolidation throughout much of the right lung. These  findings were present previously and are not felt to be changed significantly.   Electronically Signed   By: Lowella Grip III M.D.   On: 11/27/2014 14:58   Dg Foot Complete Left  12/22/2014   CLINICAL DATA:  Left middle toe pain, dark area, patient on dialysis.  EXAM: LEFT FOOT - COMPLETE 3+ VIEW  COMPARISON:  None.  FINDINGS: Five views of left foot submitted. No acute fracture or subluxation. Diffuse osteopenia. Extensive atherosclerotic vascular calcifications. Plantar spur of calcaneus.  IMPRESSION: No acute fracture or subluxation. Diffuse osteopenia. Atherosclerotic vascular calcifications. Plantar spur of calcaneus.   Electronically Signed   By: Lahoma Crocker M.D.   On: 12/19/2014 14:58    ASSESSMENT AND PLAN 1.  Acute on chronic systolic heart failure, improved 2.  Hypotension, improved since restoration of normal sinus rhythm.  He also has a history of orthostatic hypotension for which he has been on [place]-term midodrine at home 3.  Paroxysmal atrial fibrillation, holding normal sinus rhythm following cardioversion.  On IV amiodarone.  On IV heparin bridging back to oral Coumadin per pharmacy 4.  History of bioprosthetic mitral valve replacement for severe mitral regurgitation 5.  History of coronary artery disease status post CABG.  Not having any chest pain or angina. 6.  End-stage renal disease on dialysis  Plan: Continue current medications.   Signed, Darlin Coco MD

## 2014-12-19 NOTE — Progress Notes (Signed)
ANTICOAGULATION/ANTIBIOTIC CONSULT NOTE  Pharmacy Consult for Warfarin, heparin Indication: atrial fibrillation  Allergies  Allergen Reactions  . Bacitracin Other (See Comments)    unknown  . Norvasc [Amlodipine Besylate] Other (See Comments)    unknown    Patient Measurements: Height: 6\' 4"  (193 cm) Weight: 194 lb 14.2 oz (88.4 kg) IBW/kg (Calculated) : 86.8   Vital Signs: Temp: 97.8 F (36.6 C) (04/23 0308) Temp Source: Oral (04/23 0308) BP: 94/53 mmHg (04/23 0311) Pulse Rate: 70 (04/23 0311)  Labs:  Recent Labs  12/16/14 0841  12/22/2014 0345  12/05/2014 0356 12/14/2014 0500 12/26/2014 1230 12/19/14 0250  HGB  --   < > 9.5*  --  9.5*  --   --  9.1*  HCT  --   --  29.0*  --  28.1*  --   --  26.9*  PLT  --   --  311  --  284  --   --  268  APTT  --   --   --   --  77*  --   --  >200*  LABPROT  --   --  18.2*  --  19.2*  --   --  21.4*  INR  --   --  1.49  --  1.60*  --   --  1.83*  HEPARINUNFRC  --   < >  --   < >  --  <0.10* 0.19* 0.24*  CREATININE 4.81*  --  4.31*  --   --   --   --   --   < > = values in this interval not displayed.  Estimated Creatinine Clearance: 21 mL/min (by C-G formula based on Cr of 4.31).   Medical History: Past Medical History  Diagnosis Date  . UGI bleed 02/12/11    On anticoagulation; EGD w/snare polypectomy-multiple polypoid lesions antrum(benign), Chronic active gastritis (NEGATIVE H pylori)  . Anemia, chronic disease   . Sepsis due to enterococcus 02/11/11  . Paroxysmal atrial fibrillation     On coumadin  . Coronary atherosclerosis of native coronary artery     a. Multivessel status post CABG 2008.  . Cardiomyopathy   . Type 2 diabetes mellitus   . Essential hypertension, benign   . Hyperlipemia   . Gout   . S/P patent foramen ovale closure   . Hyperbilirubinemia 08/10/2013  . SBO (small bowel obstruction) 01/2014  . 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 Clostridium difficile colitis 01/2014  . Diverticulosis   . Internal  hemorrhoids   . ESRD on hemodialysis     Started 2008-2009. Dr Hinda Lenis on a TTS schedule - R forearm AVF for several years    . Orthostatic hypotension   . S/P mitral valve replacement with bioprosthetic valve     a. 2008 at time of CABG.  . Chronic systolic CHF (congestive heart failure)     a. Last EF 15% in 07/2014.  Marland Kitchen Loculated pleural effusion     a. chronic right loculated pleural effusion s/p chest tube 10/2014.  Marland Kitchen RBBB     a. Intermittent.     Medications:  Scheduled:  . allopurinol  100 mg Oral Daily  . antiseptic oral rinse  7 mL Mouth Rinse BID  . atorvastatin  20 mg Oral q1800  . darbepoetin (ARANESP) injection - DIALYSIS  100 mcg Intravenous Q Sat-HD  . famotidine  20 mg Oral Daily  . FLUoxetine  20 mg Oral Daily  . insulin aspart  0-5 Units Subcutaneous QHS  . insulin aspart  0-9 Units Subcutaneous TID WC  . midodrine  10 mg Oral TID WC  . piperacillin-tazobactam (ZOSYN)  IV  2.25 g Intravenous Q8H  . traZODone  50 mg Oral QHS  . vancomycin  125 mg Oral QID  . vancomycin  1,000 mg Intravenous Q T,Th,Sa-HD  . Warfarin - Pharmacist Dosing Inpatient   Does not apply q1800    Assessment: Heparin level =0.24, aPTT >200 sec on heparin drip  2300 units/hr in this 63 yoM on coumadin PTA for hx Afib. Heparin level remains subtherapeutic but is trending up on higher doses of heparin.  Heparin level previously <0.1 x 2 on high rate of heparin thus checked aPTT due to concern for possible heparin resistance or antithrombin 3 deficiency. APTT >200 sec.  Heparin level appears to be responding to heparin rate increase. Previous hospitalizations, patient required heparin rate of 1900 units/hr - 2500 units/hr to keep heparin level = 0.3-0.7 units/ml.  Patient has ESRD. RN reports no bleeding noted. I wiill follow based on heparin level and continue current heparin rate. Recheck heparin level in 6 hours.    INR = 1.83 after 3 days of 0.5mg  coumadin. Documented Home Dose: 0.5 mg  daily except on Sunday and Wednesday, no coumadin.  Coumadin was held since 4/15 pending surgery. S/P Fem-pop surgery on 4/17. Pharmacy consulted to restart coumadin but no doses given as INR was elevated (INR was 4.43 on 12/14/14).  Vitamin K 10 mg IV given 4/19.    Goal of Therapy:  Heparin level 0.3-0.7 units/ml INR 2-3 Monitor platelets by anticoagulation protocol: Yes   Plan:  Continue heparin to 2300 units/hr Check HL in ~6 hours after rate increase Coumadin 0.5 mg po today x 1 Daily heparin level, INR and CBC Monitor for signs of bleeding  Thanks for allowing pharmacy to be a part of this patient's care.   Nicole Cella, RPh Clinical Pharmacist Pager: [phone] 4/23/20164:19 AM

## 2014-12-19 NOTE — Progress Notes (Signed)
RT Note: Pt placed on BIPAP per his request. BBS= diminished SPO2 99% on 40% 12/6. No distress noted at this time

## 2014-12-19 NOTE — Progress Notes (Signed)
[place] TEAM 1 - Stepdown/ICU TEAM Progress Note  [name] [name] [id number] DOB: August 25, 1951 DOA: 12/01/2014 PCP: [name], MD  Admit HPI / Brief Narrative: 64 year old BM PMHx CAD status post CABG 2008 with mitral valve replacement (bioprostetic), diabetes mellitus, atrial fibrillation on Coumadin and amiodarone status post cardioversion in 2015, ESRD Tu/Th/Sat, chronic systolic heart failure with an EF of 15% in 2015, right AKA, and a right chronic loculated pleural effusion. Came to [facility] for left foot pain and was found to have cellulitis and gangrene with severe sepsis (lactic acid of 6).   MRI showed deep ulceration but no evidence of osteo. ABIs showed a heavily diseased popliteal artery with occlusion in the mid segmentand very limited flow within the runoff vessels.  On 04/14 his A. fib RVR became hard to control andhe was started on IV amiodarone and Cardiology was consulted. 04/14 after after dialysis he became hypotensive and had to be given multiple boluses of normal saline. Cardiology and IM deemed him high risk of surgery and he was therefore transferred to [facility].  HPI/Subjective: 4/23 A/O 4, states is aware that he may have one or more toes amputated, most likely on Monday or Tuesday.  Assessment/Plan:  Left lower extremity cellulitis with gangrene and ulceration / necrotic 3rd toe -Orthopedics following - status post I and D of left foot with third ray amputation  - continue empiric antibiotics for now as significant purulent material was encountered during the I&D  - Further surgery by orthopedic surgery scheduled for 4/25 or 4/26?   Cardiomyopathy / severe chronic systolic congestive heart failure EF 15% December 2015 TEE -Acute decompensation with pulmonary edema due to volume expansion in attempt to treat RVR  -Daily weights; on admission= 85.9 kg     4/23 weight= 88.5 kg -Strict I&O; since admission +7.3 L - not a candidate for ACE  inhibitor due to baseline hypertension  - baseline weight~80 kg;  Persistent atrial fibrillation/flutter with RVR  -Remains on amiodarone drip; heart rate proving very difficult to control  - 4/22 S/P DCCV  Chronic hypotension -Blood pressure with slight improvement, continue monitor closely   End-stage renal disease on hemodialysis - Tu/Th/Sat  -Nephrology following  -HD per nephrology; -Continue Midrin 10 mg TID  CAD in native artery; Multivessel status post CABG x4 2008 -Cardiology following - not a candidate for beta blockers or nitrates due to baseline hypotension  History of bioprosthetic mitral valve replacement 2008 with pericardial valve for severe MR  -Does not require anticoagulation as is a tissue valve   C. difficile colitis -Continue oral vancomycin as per C. difficile treatment protocol, no evidence at present of acute complications  Type 2 diabetes mellitus with nephropathy -CBGs currently well controlled  Hypokalemia -Potassium goal> 4 -Potassium IV 10 mEq 4 runs       Code Status: FULL Family Communication: no family present at time of exam Disposition Plan: CIR vs SNF    Consultants: [facility]  Dr.Christopher [facility]  CHF Team   Procedure/Significant Events: 4/17 I &.D. Lt foot including excision/ debridement of necrotic skin and fascia.- Lt foot third ray amputation. 4/22 S/P DCCV  Culture  Antibiotics: Zosyn 4/12 > Vanc 4/12 > Oral Vanc 4/12 >   DVT prophylaxis: SCDs + warfarin   Devices    LINES / TUBES:      Continuous Infusions: . amiodarone 30 mg/hr (12/20/14 0500)  . heparin 2,300 Units/hr (12/20/14 8889)  Objective: VITAL SIGNS: Temp: 97.8 F (36.6 C) (04/24 0316) Temp Source: Oral (04/24 0316) BP: 86/45 mmHg (04/24 0610) Pulse Rate: 94 (04/24 0610) SPO2; FIO2:   Intake/Output Summary (Last 24 hours) at 12/20/14 0726 Last data  filed at 12/20/14 0500  Gross per 24 hour  Intake 1456.7 ml  Output   2248 ml  Net -791.3 ml     Exam: General: A/O 4, mild acute respiratory distress Lungs: Clear to auscultation bilaterally without wheezes or crackles Cardiovascular: Tachycardic, Regular rhythm without murmur gallop or rub normal S1 and S2 Abdomen: Nontender, nondistended, soft, bowel sounds positive, no rebound, no ascites, no appreciable mass Extremities: No significant cyanosis, clubbing. Left foot covered and clean, mild serosanguineous fluid draining, negative purulent foul smelling drainage; toes cold, blue however patient able to move all toes on command. Patient's third left metatarsal black cold to touch, unable to move on command. Right AKA negative ulcerations well-healed scar.  Data Reviewed: Basic Metabolic Panel:  Recent Labs Lab 12/14/14 0227 12/15/14 0320 12/16/14 0841 12/12/2014 0345 12/19/14 0250 12/19/14 2015 12/20/14 0255  NA 139 141 137 139 136  --  135  K 4.8 3.6 4.2 4.1 3.1*  --  3.7  CL 102 99 97 100 95*  --  99  CO2 20 27 26 24 26   --  23  GLUCOSE 128* 103* 109* 95 83  --  110*  BUN 22 14 23 16 10   --  6  CREATININE 4.42* 3.34* 4.81* 4.31* 3.21*  --  2.70*  CALCIUM 8.5 8.6 8.5 8.6 8.5  --  8.1*  MG  --   --   --   --   --  1.8 1.7  PHOS 3.4 2.9 2.9  --   --   --   --    Liver Function Tests:  Recent Labs Lab 12/14/14 0227 12/15/14 0320 12/16/14 0841 12/20/14 0255  AST  --   --   --  50*  ALT  --   --   --  17  ALKPHOS  --   --   --  96  BILITOT  --   --   --  9.1*  PROT  --   --   --  6.2  ALBUMIN 1.5* 1.7* 1.5* 1.3*   No results for input(s): LIPASE, AMYLASE in the last 168 hours. No results for input(s): AMMONIA in the last 168 hours. CBC:  Recent Labs Lab 12/15/14 0320 12/24/2014 0345 12/25/2014 0356 12/19/14 0250 12/20/14 0255  WBC 24.1* 23.9* 25.2* 27.4* 28.1*  NEUTROABS  --   --   --   --  24.7*  HGB 9.1* 9.5* 9.5* 9.1* 9.6*  HCT 28.0* 29.0* 28.1* 26.9*  28.7*  MCV 80.7 79.5 77.2* 76.2* 77.4*  PLT 281 311 284 268 263   Cardiac Enzymes: No results for input(s): CKTOTAL, CKMB, CKMBINDEX, TROPONINI in the last 168 hours. BNP (last 3 results) No results for input(s): BNP in the last 8760 hours.  ProBNP (last 3 results)  Recent Labs  08/15/14 0245  PROBNP >70000.0*    CBG:  Recent Labs Lab 12/16/2014 2131 12/19/14 0758 12/19/14 1114 12/19/14 1846 12/19/14 2121  GLUCAP 83 83 110* 105* 111*    Recent Results (from the past 240 hour(s))  Clostridium Difficile by PCR     Status: Abnormal   Collection Time: 12/11/14 10:12 AM  Result Value Ref Range Status   C difficile by pcr POSITIVE (A) NEGATIVE Final    Comment: CRITICAL  RESULT CALLED TO, READ BACK BY AND VERIFIED WITH: S.HEATH AT 1538 ON 12/11/14 BY S.VANHOORNE      Studies:  Recent x-ray studies have been reviewed in detail by the Attending Physician  Scheduled Meds:  Scheduled Meds: . allopurinol  100 mg Oral Daily  . antiseptic oral rinse  7 mL Mouth Rinse BID  . atorvastatin  20 mg Oral q1800  . darbepoetin (ARANESP) injection - DIALYSIS  100 mcg Intravenous Q Sat-HD  . famotidine  20 mg Oral Daily  . FLUoxetine  20 mg Oral Daily  . insulin aspart  0-5 Units Subcutaneous QHS  . insulin aspart  0-9 Units Subcutaneous TID WC  . midodrine  10 mg Oral TID WC  . piperacillin-tazobactam (ZOSYN)  IV  2.25 g Intravenous Q8H  . traZODone  50 mg Oral QHS  . vancomycin  125 mg Oral QID  . vancomycin  1,000 mg Intravenous Q T,Th,Sa-HD  . Warfarin - Pharmacist Dosing Inpatient   Does not apply q1800    Time spent on care of this patient: 40 mins   Jerl Munyan, Geraldo Docker , MD  [facility] Office  [phone] Pager - [phone]  On-Call/Text Page:      [email or web address]      password [email or web address]  If 7PM-7AM, please contact night-coverage [email or web address] Password [email or web address] 12/20/2014, 7:26 AM   LOS: 12 days   Care during the described time interval was provided by me .  I  have reviewed this patient's available data, including medical history, events of note, physical examination, radiology studies and test results as part of my evaluation  Dia Crawford, MD [phone] Pager

## 2014-12-19 NOTE — Progress Notes (Signed)
Patient ID: DEMARIS LEAVELL, male   DOB: 10/11/1950, 64 y.o.   MRN: [id number]  [facility] Progress Note    Subjective:   Feels well   Objective:   BP 99/56 mmHg  Pulse 71  Temp(Src) 98.6 F (37 C) (Oral)  Resp 25  Ht 6\' 4"  (1.93 m)  Wt 88.4 kg (194 lb 14.2 oz)  BMI 23.73 kg/m2  SpO2 93%  Intake/Output: I/O last 3 completed shifts: In: 1951.7 [P.O.:180; I.V.:1621.7; IV Piggyback:150] Out: -    Intake/Output this shift:    Weight change: 0.9 kg (1 lb 15.8 oz)  Physical Exam: Gen:WD WN AAM in NAD CVS:RRR no rub Resp:cta TWS:FKCLEX Ext:R AVF +T/B no edema, ischemic 4th left toe, s/p RAKA  Labs: BMET  Recent Labs Lab 12/24/2014 0308 12/14/14 0227 12/15/14 0320 12/16/14 0841 12/02/2014 0345 12/19/14 0250  NA 136 139 141 137 139 136  K 4.4 4.8 3.6 4.2 4.1 3.1*  CL 100 102 99 97 100 95*  CO2 20 20 27 26 24 26   GLUCOSE 124* 128* 103* 109* 95 83  BUN 11 22 14 23 16 10   CREATININE 3.35* 4.42* 3.34* 4.81* 4.31* 3.21*  ALBUMIN 1.5* 1.5* 1.7* 1.5*  --   --   CALCIUM 7.8* 8.5 8.6 8.5 8.6 8.5  PHOS  --  3.4 2.9 2.9  --   --    CBC  Recent Labs Lab 12/15/14 0320 12/09/2014 0345 12/15/2014 0356 12/19/14 0250  WBC 24.1* 23.9* 25.2* 27.4*  HGB 9.1* 9.5* 9.5* 9.1*  HCT 28.0* 29.0* 28.1* 26.9*  MCV 80.7 79.5 77.2* 76.2*  PLT 281 311 284 268    @IMGRELPRIORS @ Medications:    . allopurinol  100 mg Oral Daily  . antiseptic oral rinse  7 mL Mouth Rinse BID  . atorvastatin  20 mg Oral q1800  . darbepoetin (ARANESP) injection - DIALYSIS  100 mcg Intravenous Q Sat-HD  . famotidine  20 mg Oral Daily  . FLUoxetine  20 mg Oral Daily  . insulin aspart  0-5 Units Subcutaneous QHS  . insulin aspart  0-9 Units Subcutaneous TID WC  . midodrine  10 mg Oral TID WC  . piperacillin-tazobactam (ZOSYN)  IV  2.25 g Intravenous Q8H  . traZODone  50 mg Oral QHS  . vancomycin  125 mg Oral QID  . vancomycin  1,000 mg Intravenous Q T,Th,Sa-HD  . warfarin  0.5 mg Oral  ONCE-1800  . Warfarin - Pharmacist Dosing Inpatient   Does not apply q1800   Dialysis Orders: Center: [facility] on TTS . EDW 86kg HD Bath 2k Time 210 min (will need to be changed to 4 hours at time of discharge due to CMP) Heparin 0. Access R A AVF BFR 400 DFR 600  Zemplar 0 mcg IV/HD Epogen 8000 Units IV/HD Venofer 50mg  weekly  Assessment/ Plan:   1. A fib with RVR- improved with IV amiodarone, appreciate cards input. Will cont with UF as tolerated with HD. 1. S/p cardioversion and doing well 2. ESRD- normally TTS but off schedule due to the development of CHF in setting of A fib with RVR 1. Plan for HD today to get back on schedule on Tues/Thurs/Saturday schedule 3. SOB/SSCP- as above, new onset A fib with RVR now hypotensive, awaiting cardiology's input to help rate control. 4. PVD- now with ischemic changes to 4th digit on left foot, await eval by Dr. Sharol Given. 5. 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 Nutrition:  protein malnutrition- per primary 8. 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 CMP- EF15% difficulty maintaining BP due to A fib/RVR, will be a challenge for HD. Poor overall prognosis given multiple end-stage disease processes, may need to consult palliative care to help set goals and limits of care as he is a full code 10. Hypotension: on midodrine but still with low BP due to RVR  Meka Lewan A 12/19/2014, 10:38 AM

## 2014-12-19 NOTE — Progress Notes (Addendum)
ANTICOAGULATION/ANTIBIOTIC CONSULT NOTE  Pharmacy Consult for Warfarin, heparin Indication: atrial fibrillation  Allergies  Allergen Reactions  . Bacitracin Other (See Comments)    unknown  . Norvasc [Amlodipine Besylate] Other (See Comments)    unknown    Patient Measurements: Height: 6\' 4"  (193 cm) Weight: 194 lb 14.2 oz (88.4 kg) IBW/kg (Calculated) : 86.8   Vital Signs: Temp: 98.6 F (37 C) (04/23 0700) Temp Source: Oral (04/23 0700) BP: 94/53 mmHg (04/23 0311) Pulse Rate: 70 (04/23 0311)  Labs:  Recent Labs  12/01/2014 0345  12/20/2014 0356  12/21/2014 1230 12/19/14 0250 12/19/14 0910  HGB 9.5*  --  9.5*  --   --  9.1*  --   HCT 29.0*  --  28.1*  --   --  26.9*  --   PLT 311  --  284  --   --  268  --   APTT  --   --  77*  --   --  >200*  --   LABPROT 18.2*  --  19.2*  --   --  21.4*  --   INR 1.49  --  1.60*  --   --  1.83*  --   HEPARINUNFRC  --   < >  --   < > 0.19* 0.24* 0.38  CREATININE 4.31*  --   --   --   --  3.21*  --   < > = values in this interval not displayed.  Estimated Creatinine Clearance: 28.2 mL/min (by C-G formula based on Cr of 3.21).   Medical History: Past Medical History  Diagnosis Date  . UGI bleed 02/12/11    On anticoagulation; EGD w/snare polypectomy-multiple polypoid lesions antrum(benign), Chronic active gastritis (NEGATIVE H pylori)  . Anemia, chronic disease   . Sepsis due to enterococcus 02/11/11  . Paroxysmal atrial fibrillation     On coumadin  . Coronary atherosclerosis of native coronary artery     a. Multivessel status post CABG 2008.  . Cardiomyopathy   . Type 2 diabetes mellitus   . Essential hypertension, benign   . Hyperlipemia   . Gout   . S/P patent foramen ovale closure   . Hyperbilirubinemia 08/10/2013  . SBO (small bowel obstruction) 01/2014  . 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 Clostridium difficile colitis 01/2014  . Diverticulosis   . Internal hemorrhoids   . ESRD on hemodialysis     Started  2008-2009. Dr Hinda Lenis on a TTS schedule - R forearm AVF for several years    . Orthostatic hypotension   . S/P mitral valve replacement with bioprosthetic valve     a. 2008 at time of CABG.  . Chronic systolic CHF (congestive heart failure)     a. Last EF 15% in 07/2014.  Marland Kitchen Loculated pleural effusion     a. chronic right loculated pleural effusion s/p chest tube 10/2014.  Marland Kitchen RBBB     a. Intermittent.     Medications:  Scheduled:  . allopurinol  100 mg Oral Daily  . antiseptic oral rinse  7 mL Mouth Rinse BID  . atorvastatin  20 mg Oral q1800  . darbepoetin (ARANESP) injection - DIALYSIS  100 mcg Intravenous Q Sat-HD  . famotidine  20 mg Oral Daily  . FLUoxetine  20 mg Oral Daily  . insulin aspart  0-5 Units Subcutaneous QHS  . insulin aspart  0-9 Units Subcutaneous TID WC  . midodrine  10 mg Oral TID WC  .  piperacillin-tazobactam (ZOSYN)  IV  2.25 g Intravenous Q8H  . traZODone  50 mg Oral QHS  . vancomycin  125 mg Oral QID  . vancomycin  1,000 mg Intravenous Q T,Th,Sa-HD  . warfarin  0.5 mg Oral ONCE-1800  . Warfarin - Pharmacist Dosing Inpatient   Does not apply q1800    Assessment: 59 yoM with ESED on HD on coumadin PTA for hx Afib. Continuing on heparin bridge. HLs previously remained subtherapeutic but now trending up on higher doses. 3rd shift checked aPTT due to concern for possible heparin resistance or antithrombin 3 deficiency. APTT >200 sec and dose was not increased overnight despite subtherapeutic HL. Now HL appears to be responding and is therapeutic this AM without previous rate increase.  Coumadin was held since 4/15-4/20 and now s/p Fem-pop surgery on 4/17. Pharmacy consulted to restart coumadin but no doses given initally as INR was elevated (INR was 4.43 on 12/14/14). Vitamin K 10 mg IV given 4/19 for surgery - INR down to 1.6.  INR increased, still subtherapeutic 1.65>>1.83 after 3 days of 0.5mg  coumadin.  No bleeding documented. Hg 9.1, plt stable  wnl. Documented Home Dose: 0.5 mg daily except on Sunday and Wednesday, no coumadin.  Goal of Therapy:  Heparin level 0.3-0.7 units/ml INR 2-3 Monitor platelets by anticoagulation protocol: Yes   Plan:  -Heparin drip @2300  units/hr- d/c when INR therapeutic x2 -Coumadin 0.5 mg x1 dose. Hesitant to increase dose as was supratherapeutic previously -8h HL -Daily HL/CBC -Daily PT/INR -Mon s/sx bleed  Elicia Lamp, PharmD Clinical Pharmacist - Resident Pager [phone] 12/19/2014 10:27 AM  Addendum: Recheck heparin level = 0.37 therapeutic on 2300 units/hr  Continue current rate.  Maryanna Shape, PharmD, BCPS  Clinical Pharmacist  Pager: [phone]

## 2014-12-19 NOTE — Progress Notes (Signed)
Potassium runs discontinued. Per dialysis nurse pt received K bath.

## 2014-12-20 DIAGNOSIS — E1140 Type 2 diabetes mellitus with diabetic neuropathy, unspecified: Secondary | ICD-10-CM | POA: Diagnosis present

## 2014-12-20 DIAGNOSIS — T8203XA Leakage of heart valve prosthesis, initial encounter: Secondary | ICD-10-CM | POA: Diagnosis present

## 2014-12-20 DIAGNOSIS — E876 Hypokalemia: Secondary | ICD-10-CM | POA: Diagnosis present

## 2014-12-20 DIAGNOSIS — I5022 Chronic systolic (congestive) heart failure: Secondary | ICD-10-CM | POA: Diagnosis present

## 2014-12-20 DIAGNOSIS — I255 Ischemic cardiomyopathy: Secondary | ICD-10-CM | POA: Diagnosis present

## 2014-12-20 DIAGNOSIS — I96 Gangrene, not elsewhere classified: Secondary | ICD-10-CM | POA: Diagnosis present

## 2014-12-20 LAB — CBC WITH DIFFERENTIAL/PLATELET
Basophils Absolute: 0 10*3/uL (ref 0.0–0.1)
Basophils Relative: 0 % (ref 0–1)
Eosinophils Absolute: 0.3 10*3/uL (ref 0.0–0.7)
Eosinophils Relative: 1 % (ref 0–5)
HCT: 28.7 % — ABNORMAL LOW (ref 39.0–52.0)
HEMOGLOBIN: 9.6 g/dL — AB (ref 13.0–17.0)
LYMPHS ABS: 1.1 10*3/uL (ref 0.7–4.0)
Lymphocytes Relative: 4 % — ABNORMAL LOW (ref 12–46)
MCH: 25.9 pg — ABNORMAL LOW (ref 26.0–34.0)
MCHC: 33.4 g/dL (ref 30.0–36.0)
MCV: 77.4 fL — ABNORMAL LOW (ref 78.0–100.0)
MONO ABS: 2 10*3/uL — AB (ref 0.1–1.0)
Monocytes Relative: 7 % (ref 3–12)
NEUTROS PCT: 88 % — AB (ref 43–77)
Neutro Abs: 24.7 10*3/uL — ABNORMAL HIGH (ref 1.7–7.7)
Platelets: 263 10*3/uL (ref 150–400)
RBC: 3.71 MIL/uL — ABNORMAL LOW (ref 4.22–5.81)
RDW: 22.6 % — AB (ref 11.5–15.5)
WBC: 28.1 10*3/uL — ABNORMAL HIGH (ref 4.0–10.5)

## 2014-12-20 LAB — COMPREHENSIVE METABOLIC PANEL
ALBUMIN: 1.3 g/dL — AB (ref 3.5–5.2)
ALT: 17 U/L (ref 0–53)
AST: 50 U/L — ABNORMAL HIGH (ref 0–37)
Alkaline Phosphatase: 96 U/L (ref 39–117)
Anion gap: 13 (ref 5–15)
BUN: 6 mg/dL (ref 6–23)
CHLORIDE: 99 mmol/L (ref 96–112)
CO2: 23 mmol/L (ref 19–32)
CREATININE: 2.7 mg/dL — AB (ref 0.50–1.35)
Calcium: 8.1 mg/dL — ABNORMAL LOW (ref 8.4–10.5)
GFR calc Af Amer: 27 mL/min — ABNORMAL LOW (ref >90)
GFR, EST NON AFRICAN AMERICAN: 23 mL/min — AB (ref >90)
Glucose, Bld: 110 mg/dL — ABNORMAL HIGH (ref 70–99)
Potassium: 3.7 mmol/L (ref 3.5–5.1)
SODIUM: 135 mmol/L (ref 135–145)
Total Bilirubin: 9.1 mg/dL — ABNORMAL HIGH (ref 0.3–1.2)
Total Protein: 6.2 g/dL (ref 6.0–8.3)

## 2014-12-20 LAB — MAGNESIUM: Magnesium: 1.7 mg/dL (ref 1.5–2.5)

## 2014-12-20 LAB — GLUCOSE, CAPILLARY
Glucose-Capillary: 97 mg/dL (ref 70–99)
Glucose-Capillary: 113 mg/dL — ABNORMAL HIGH (ref 70–99)
Glucose-Capillary: 121 mg/dL — ABNORMAL HIGH (ref 70–99)
Glucose-Capillary: 115 mg/dL — ABNORMAL HIGH (ref 70–99)

## 2014-12-20 LAB — HEPARIN LEVEL (UNFRACTIONATED): Heparin Unfractionated: 0.31 IU/mL (ref 0.30–0.70)

## 2014-12-20 LAB — PROTIME-INR
INR: 2.14 — ABNORMAL HIGH (ref 0.00–1.49)
Prothrombin Time: 24.1 seconds — ABNORMAL HIGH (ref 11.6–15.2)

## 2014-12-20 MED ORDER — WARFARIN 0.5 MG HALF TABLET
0.5000 mg | ORAL_TABLET | Freq: Once | ORAL | Status: AC
  Administered 2014-12-20: 0.5 mg via ORAL
  Filled 2014-12-20: qty 1

## 2014-12-20 MED ORDER — SODIUM CHLORIDE 0.9 % IV BOLUS (SEPSIS)
250.0000 mL | Freq: Once | INTRAVENOUS | Status: AC
  Administered 2014-12-20: 250 mL via INTRAVENOUS

## 2014-12-20 MED ORDER — SODIUM CHLORIDE 0.9 % IV SOLN
INTRAVENOUS | Status: DC
  Administered 2014-12-20 – 2014-12-21 (×2): via INTRAVENOUS

## 2014-12-20 MED ORDER — PIPERACILLIN-TAZOBACTAM IN DEX 2-0.25 GM/50ML IV SOLN
2.2500 g | Freq: Three times a day (TID) | INTRAVENOUS | Status: DC
  Administered 2014-12-20 – 2014-12-23 (×10): 2.25 g via INTRAVENOUS
  Filled 2014-12-20 (×12): qty 50

## 2014-12-20 MED ORDER — SODIUM CHLORIDE 0.9 % IV BOLUS (SEPSIS)
250.0000 mL | Freq: Once | INTRAVENOUS | Status: AC
Start: 2014-12-20 — End: 2014-12-20
  Administered 2014-12-20: 250 mL via INTRAVENOUS

## 2014-12-20 NOTE — Progress Notes (Signed)
CTSP secondary to going back into atrial fibrillation after HD.  HR initially low 100's but now in the 120-130's and SBP dropped to 51mmHg.  2500cc of fluid pulled off during HD and this was terminated early due to hypotension.  Currently asymptomatic with clear lungs posteriorly at bases.  WBC is trending upward.  Currently his HR is 108bpm and SBP 33mmHg after initiation of IVF.  He denies any complaints.  With WBC trending upward and BP dropping ? Whether he may be getting septic.  He is afebrile.  He has been volume overloaded in the past but has not tolerated pulling a lot of volume off. Would be helpful to have a CVP line to assess volume status. He has been on IV Heparin so acute PE less likely.  I do not think that a HR in the low 100's would cause SBP to drop into the 60's and HR improved from 120's to 105bpm after IVF bolus.  Recommend Critical care consult to work up possible sepsis.  Consider central line so we can follow CVP.  Continue IV Amio for now for rate control.  Would not recommend repeating DCCV at this time since he was just cardioverted on Amio and did not hold.  Would try to load a few more days on Amio and get him over acute illness before repeat cardioversion.  Consider adding low dose Neo for hypotension although it looks like his BP has been in the 80-90's for several days.

## 2014-12-20 NOTE — Progress Notes (Signed)
Patient ID: [name] [name], male   DOB: [birth year], 64 y.o.   MRN: [id number]  [facility] Progress Note    Subjective:   No new complaints   Objective:   BP 88/47 mmHg  Pulse 94  Temp(Src) 98.5 F (36.9 C) (Oral)  Resp 22  Ht 6\' 4"  (1.93 m)  Wt 89.2 kg (196 lb 10.4 oz)  BMI 23.95 kg/m2  SpO2 97%  Intake/Output: I/O last 3 completed shifts: In: 2142.8 [P.O.:440; I.V.:952.8; IV Piggyback:750] Out: 2248 [Other:2248]   Intake/Output this shift:  Total I/O In: 39.7 [I.V.:39.7] Out: -  Weight change: 0.6 kg (1 lb 5.2 oz)  Physical Exam: Gen:WD WN AAM in NAD CVS:IRR IRR Resp:cta TDV:VOHYWV Ext:s/p RAKA, RAVF +T/B, +pretibeal edema on left, ischemic/gangrenous 4th digit, blood seeping from dressings on left foot  Labs: BMET  Recent Labs Lab 12/14/14 0227 12/15/14 0320 12/16/14 0841 12/22/2014 0345 12/19/14 0250 12/20/14 0255  NA 139 141 137 139 136 135  K 4.8 3.6 4.2 4.1 3.1* 3.7  CL 102 99 97 100 95* 99  CO2 20 27 26 24 26 23   GLUCOSE 128* 103* 109* 95 83 110*  BUN 22 14 23 16 10 6   CREATININE 4.42* 3.34* 4.81* 4.31* 3.21* 2.70*  ALBUMIN 1.5* 1.7* 1.5*  --   --  1.3*  CALCIUM 8.5 8.6 8.5 8.6 8.5 8.1*  PHOS 3.4 2.9 2.9  --   --   --    CBC  Recent Labs Lab 12/15/2014 0345 12/21/2014 0356 12/19/14 0250 12/20/14 0255  WBC 23.9* 25.2* 27.4* 28.1*  NEUTROABS  --   --   --  24.7*  HGB 9.5* 9.5* 9.1* 9.6*  HCT 29.0* 28.1* 26.9* 28.7*  MCV 79.5 77.2* 76.2* 77.4*  PLT 311 284 268 263    @IMGRELPRIORS @ Medications:    . allopurinol  100 mg Oral Daily  . antiseptic oral rinse  7 mL Mouth Rinse BID  . atorvastatin  20 mg Oral q1800  . darbepoetin (ARANESP) injection - DIALYSIS  100 mcg Intravenous Q Sat-HD  . famotidine  20 mg Oral Daily  . FLUoxetine  20 mg Oral Daily  . insulin aspart  0-5 Units Subcutaneous QHS  . insulin aspart  0-9 Units Subcutaneous TID WC  . midodrine  10 mg Oral TID WC  . piperacillin-tazobactam (ZOSYN)  IV  2.25 g  Intravenous Q8H  . sodium chloride  250 mL Intravenous Once  . traZODone  50 mg Oral QHS  . vancomycin  125 mg Oral QID  . vancomycin  1,000 mg Intravenous Q T,Th,Sa-HD  . warfarin  0.5 mg Oral ONCE-1800  . Warfarin - Pharmacist Dosing Inpatient   Does not apply [id number]     Assessment/ Plan:   1. A fib with RVR- improved with IV amiodarone, appreciate cards input. Will cont with UF as tolerated with HD. 1. S/p cardioversion 12/13/2014 but now back in A fib, cards following and on IV amio 2. ESRD- normally TTS and now back on schedule 3. SOB/SSCP- as above, new onset A fib with RVR now hypotensive, appreciate cardiology's input to help rate control. 4. PVD- now with ischemic changes to 4th digit on left foot, await eval by Dr. Sharol Given. 5. 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 Nutrition: protein malnutrition- per primary 8. 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 Awaiting amputation of 4th toe per Dr. Sharol Given 9. CMP- EF15% difficulty maintaining BP due  to A fib/RVR, will be a challenge for HD. Poor overall prognosis given multiple end-stage disease processes, may need to consult palliative care to help set goals and limits of care as he is a full code 10. Hypotension: on midodrine but still with low BP due to RVR  [name] [name] A 12/20/2014, 8:52 AM

## 2014-12-20 NOTE — Progress Notes (Addendum)
Patient Name: [name] [name] Date of Encounter: 12/20/2014     Active Problems:   Atrial fibrillation with RVR   Debes term current use of anticoagulant therapy   ESRD (end stage renal disease) on dialysis   Recurrent pleural effusion on right   Cellulitis of left foot   Sepsis   Chronic systolic CHF (congestive heart failure)   History of mitral valve replacement with bioprosthetic valve   Acute on chronic systolic CHF (congestive heart failure)   Cardiomyopathy   Chronic atrial fibrillation   Typical atrial flutter   End-stage renal disease on hemodialysis   Bioprosthetic mitral valve replacement, current hospitalization   C. difficile colitis   Type 2 diabetes with nephropathy   Toe gangrene   Cellulitis of left lower extremity   Congestive dilated cardiomyopathy   Persistent atrial fibrillation   Hypotension, chronic   CAD in native artery   Other specified hypotension   Gangrene   Necrotic toes   Chronic systolic congestive heart failure   Prosthetic mitral valve regurgitation   Type 2 diabetes, controlled, with neuropathy   Hypokalemia    SUBJECTIVE  He had TEE/cardioversion on 12/19/2014. Yesterday was maintaining NSR on IV amiodarone.  Last night went back into rapid atrial fibrillation. Was checked by Dr. Radford Pax at midnight. BP was low after HD and he responded to IV fluids with slowing of his ventricular response. Today patient feels well. BP low 91/70 but patient tolerating well and denies dizziness. Rhythm is more stable and may be back to NSR with bigeminy PVCs. EKG voltage very low and P waves are not clearly seen.  Will check a 12 lead EKG. Continue IV amiodarone. CURRENT MEDS . allopurinol  100 mg Oral Daily  . antiseptic oral rinse  7 mL Mouth Rinse BID  . atorvastatin  20 mg Oral q1800  . darbepoetin (ARANESP) injection - DIALYSIS  100 mcg Intravenous Q Sat-HD  . famotidine  20 mg Oral Daily  . FLUoxetine  20 mg Oral Daily  . insulin aspart  0-5 Units  Subcutaneous QHS  . insulin aspart  0-9 Units Subcutaneous TID WC  . midodrine  10 mg Oral TID WC  . piperacillin-tazobactam (ZOSYN)  IV  2.25 g Intravenous Q8H  . traZODone  50 mg Oral QHS  . vancomycin  125 mg Oral QID  . vancomycin  1,000 mg Intravenous Q T,Th,Sa-HD  . warfarin  0.5 mg Oral ONCE-1800  . Warfarin - Pharmacist Dosing Inpatient   Does not apply q1800    OBJECTIVE  Filed Vitals:   12/20/14 0500 12/20/14 0610 12/20/14 0738 12/20/14 0800  BP: 84/40 86/45 88/47    Pulse: 94 94    Temp:    98.5 F (36.9 C)  TempSrc:    Oral  Resp: 18 22    Height:      Weight:      SpO2: 99% 97%      Intake/Output Summary (Last 24 hours) at 12/20/14 1102 Last data filed at 12/20/14 0800  Gross per 24 hour  Intake 1336.1 ml  Output   2248 ml  Net -911.9 ml   Filed Weights   12/19/14 1430 12/19/14 1805 12/20/14 0316  Weight: 199 lb 4.7 oz (90.4 kg) 195 lb 1.7 oz (88.5 kg) 196 lb 10.4 oz (89.2 kg)    PHYSICAL EXAM  General: Pleasant, NAD.  In no acute distress.  Nasal oxygen in place Neuro: Alert and oriented X 3. Moves all extremities spontaneously. Psych: Normal affect.  HEENT:  Normal  Neck: Supple without bruits or JVD. Lungs:  Decreased breath sounds at bases Heart: RRR no s3, s4, or murmurs. Abdomen: Soft, non-tender, non-distended, BS + x 4.   Accessory Clinical Findings  CBC  Recent Labs  12/19/14 0250 12/20/14 0255  WBC 27.4* 28.1*  NEUTROABS  --  24.7*  HGB 9.1* 9.6*  HCT 26.9* 28.7*  MCV 76.2* 77.4*  PLT 268 676   Basic Metabolic Panel  Recent Labs  12/19/14 0250 12/19/14 2015 12/20/14 0255  NA 136  --  135  K 3.1*  --  3.7  CL 95*  --  99  CO2 26  --  23  GLUCOSE 83  --  110*  BUN 10  --  6  CREATININE 3.21*  --  2.70*  CALCIUM 8.5  --  8.1*  MG  --  1.8 1.7   Liver Function Tests  Recent Labs  12/20/14 0255  AST 50*  ALT 17  ALKPHOS 96  BILITOT 9.1*  PROT 6.2  ALBUMIN 1.3*   No results for input(s): LIPASE, AMYLASE in the  last 72 hours. Cardiac Enzymes No results for input(s): CKTOTAL, CKMB, CKMBINDEX, TROPONINI in the last 72 hours. BNP Invalid input(s): POCBNP D-Dimer No results for input(s): DDIMER in the last 72 hours. Hemoglobin A1C No results for input(s): HGBA1C in the last 72 hours. Fasting Lipid Panel No results for input(s): CHOL, HDL, LDLCALC, TRIG, CHOLHDL, LDLDIRECT in the last 72 hours. Thyroid Function Tests No results for input(s): TSH, T4TOTAL, T3FREE, THYROIDAB in the last 72 hours.  Invalid input(s): FREET3  TELE  Possibly back to NSR with bigeminy. Will check 12 lead to see P waves better if present.    Radiology/Studies  Mr Foot Left Wo Contrast  12/09/2014   CLINICAL DATA:  Disc pain and swelling of the left third toe with a draining wound on the plantar surface.  EXAM: MRI OF THE LEFT FOREFOOT WITHOUT CONTRAST  TECHNIQUE: Multiplanar, multisequence MR imaging was performed. No intravenous contrast was administered.  COMPARISON:  Plain films of the left foot 12/04/2014.  FINDINGS: A large ulceration is seen on the plantar surface of the foot at the level of the second and third MTP joints. No underlying abscess is identified. No bone marrow signal abnormality to suggest osteomyelitis is identified. The ulceration appears to extend almost bone at the third MTP joint. Intrinsic musculature of the foot demonstrates atrophy. Mild subcutaneous edema is seen over the dorsum of the foot.  IMPRESSION: Deep ulceration on the plantar surface of the foot centered at the level of the third MTP joint without underlying abscess or osteomyelitis.   Electronically Signed   By: Inge Rise M.D.   On: 12/09/2014 16:13   [place] Lower Ext Art Left  12/10/2014   CLINICAL DATA:  64 year old male with decreased pulses and right above the knee amputation.  EXAM: UNILATERAL RIGHT LOWER EXTREMITY ARTERIAL DUPLEX SCAN  TECHNIQUE: Gray-scale sonography as well as color Doppler and duplex ultrasound was  performed to evaluate the arteries of the lower extremity.  COMPARISON:  Prior CT pelvis 09/09/2014  FINDINGS: Sonographic interrogation of the right lower extremity vessels demonstrates severe ulcerated, irregular and calcified plaque beginning in the common femoral artery and extending to the ankle. Biphasic pulses are present within the common femoral and superficial femoral arteries. There is no evidence of focal stenosis. The deep femoral artery is patent.  The popliteal artery is heavily diseased and likely occluded for a short  segment. Minimal flow is present within the anterior tibial and posterior tibial arteries.  IMPRESSION: 1. Heavily diseased popliteal artery with occlusion in the mid segment. 2. Very limited flow within the runoff vessels likely via collateral flow. 3. Irregular and calcified plaque throughout the common femoral and superficial femoral arteries without focal stenosis. 4. The profunda femoral artery remains patent. Signed,  Criselda Peaches, MD  [facility]  [facility]   Electronically Signed   By: Jacqulynn Cadet M.D.   On: 12/10/2014 15:40   Dg Chest Portable 1 View  12/14/2014   CLINICAL DATA:  CHF, shortness of breath.  EXAM: PORTABLE CHEST - 1 VIEW  COMPARISON:  12/09/2014  FINDINGS: Partially loculated right pleural effusion including an upper paramediastinal component, similar. Small left pleural effusion. Interstitial prominence. Cardiomediastinal contours partially obscured though aligned over those superior large. Central vascular congestion. No pneumothorax. Median sternotomy. Mitral valve replacement. CABG.  IMPRESSION: Enlarged cardiac silhouette with central vascular congestion and similar to mildly increased pulmonary edema pattern.  Right greater than left pleural effusions, loculated on the right, similar to prior.   Electronically Signed   By: Carlos Levering M.D.   On: 12/14/2014 04:16   Dg Chest Port 1  View  12/03/2014   CLINICAL DATA:  Renal failure ; anemia  EXAM: PORTABLE CHEST - 1 VIEW  COMPARISON:  November 18, 2014 chest radiograph and chest CT November 09, 2014  FINDINGS: There is extensive airspace consolidation and loculated effusion throughout much of the right hemithorax, stable. There is underlying generalized interstitial edema. There is cardiomegaly with pulmonary venous hypertension. Patient is status post median sternotomy. No bone lesions are appreciable.  IMPRESSION: Underlying congestive heart failure. Extensive loculated effusion and airspace consolidation throughout much of the right lung. These findings were present previously and are not felt to be changed significantly.   Electronically Signed   By: Lowella Grip III M.D.   On: 11/29/2014 14:58   Dg Foot Complete Left  12/13/2014   CLINICAL DATA:  Left middle toe pain, dark area, patient on dialysis.  EXAM: LEFT FOOT - COMPLETE 3+ VIEW  COMPARISON:  None.  FINDINGS: Five views of left foot submitted. No acute fracture or subluxation. Diffuse osteopenia. Extensive atherosclerotic vascular calcifications. Plantar spur of calcaneus.  IMPRESSION: No acute fracture or subluxation. Diffuse osteopenia. Atherosclerotic vascular calcifications. Plantar spur of calcaneus.   Electronically Signed   By: Lahoma Crocker M.D.   On: 11/30/2014 14:58    ASSESSMENT AND PLAN 1.  Acute on chronic systolic heart failure, improved 2.  Hypotension. He also has a history of orthostatic hypotension for which he has been on Clonch-term midodrine at home 3.  Paroxysmal atrial fibrillation.  On IV amiodarone.  On IV heparin bridging back to oral Coumadin per pharmacy 4.  History of bioprosthetic mitral valve replacement for severe mitral regurgitation 5.  History of coronary artery disease status post CABG.  Not having any chest pain or angina. 6.  End-stage renal disease on dialysis  Plan: Continue current medications. Check EKG. Continue  amiodarone.  Signed, Darlin Coco MD

## 2014-12-20 NOTE — Progress Notes (Signed)
BP 83/46, HR 111. Kathline Magic notified. No new orders received. States as [place] as patient maintains his mentation is okay with a SBP in the 80s. Will continue to monitor.   Lum Babe, RN

## 2014-12-20 NOTE — Progress Notes (Signed)
Dr. Radford Pax notified earlier in shift that patient had converted back into a-fib after HD. At this time HR in low 100s, BP 104/47. No new orders received. Around 2300, HR began sustaining in the 120s-130s, BP began dropping. Patient mentating well; however is confused about the date. Kathline Magic notified, asked RN to page cardiology. Dr. Radford Pax notified and came to the bedside to assess. New order received. Kathline Magic notified of Dr. Theodosia Blender suggestions. New order received. Will continue to monitor.   Lum Babe, RN

## 2014-12-20 NOTE — Progress Notes (Signed)
[facility] TEAM 1 - Stepdown/ICU TEAM Progress Note  [name] [name]:[id number] DOB: [birth year] DOA: 12/24/2014 PCP: [name], MD  Admit HPI / Brief Narrative: 64 year old BM PMHx CAD status post CABG 2008 with mitral valve replacement (bioprostetic), diabetes mellitus, atrial fibrillation on Coumadin and amiodarone status post cardioversion in 2015, ESRD Tu/Th/Sat, chronic systolic heart failure with an EF of 15% in 2015, right AKA, and a right chronic loculated pleural effusion. Came to [facility] for left foot pain and was found to have cellulitis and gangrene with severe sepsis (lactic acid of 6).   MRI showed deep ulceration but no evidence of osteo. ABIs showed a heavily diseased popliteal artery with occlusion in the mid segmentand very limited flow within the runoff vessels.  On 04/14 his A. fib RVR became hard to control andhe was started on IV amiodarone and Cardiology was consulted. 04/14 after after dialysis he became hypotensive and had to be given multiple boluses of normal saline. Cardiology and IM deemed him high risk of surgery and he was therefore transferred to [facility], [facility], [facility].  HPI/Subjective: 4/24 A/O 4, states is aware that he may have one or more toes amputated, most likely on Monday or Tuesday.  Assessment/Plan:  Left lower extremity cellulitis with gangrene and ulceration / necrotic 3rd toe -Orthopedics following - status post I and D of left foot with third ray amputation  - continue empiric antibiotics for now as significant purulent material was encountered during the I&D  - Further surgery by orthopedic surgery scheduled for 4/25 or 4/26? Again attempted to contact orthopedics to get a firm date on when patient's surgery would be performed, was unsuccessful.  Cardiomyopathy / severe chronic systolic congestive heart failure EF 15% December 2015 TEE -Acute decompensation with pulmonary edema due to volume expansion in attempt to treat RVR  -Daily  weights; on admission= 85.9 kg     4/24 weight= 89.2 kg -Strict I&O; since admission +7.4 L - not a candidate for ACE inhibitor due to baseline hypertension  - baseline weight~80 kg;  Persistent atrial fibrillation/flutter with RVR  -Remains on amiodarone drip; heart rate proving very difficult to control  - 4/22 S/P DCCV  Chronic hypotension -4/24 patient continues to be significantly hypotensive we will bolus normal saline 250 ml 1 then start normal saline at 40 ml/hr -Will need to monitor very closely patient's fluid status given his extremely poor EF of 15%, and end-stage renal disease -Continue Midrin 10 mgTID.  -A.m. cortisol pending  End-stage renal disease on hemodialysis - Tu/Th/Sat  -Nephrology following  -HD per nephrology; -Continue Midrin 10 mg TID  CAD in native artery; Multivessel status post CABG x4 2008 -Cardiology following - not a candidate for beta blockers or nitrates due to baseline hypotension  History of bioprosthetic mitral valve replacement 2008 with pericardial valve for severe MR  -Does not require anticoagulation as is a tissue valve   C. difficile colitis -Continue oral vancomycin as per C. difficile treatment protocol, no evidence at present of acute complications  Type 2 diabetes mellitus with nephropathy -CBGs currently well controlled  Hypokalemia -Potassium goal> 4 -Potassium IV 10 mEq 4 runs       Code Status: FULL Family Communication: no family present at time of exam Disposition Plan: CIR vs SNF    Consultants: [facility]  Dr.Christopher [facility]  CHF Team   Procedure/Significant Events: 4/17 I &.D. Lt foot including excision/ debridement of necrotic skin and fascia.- Lt  foot third ray amputation. 4/22 S/P DCCV  Culture  Antibiotics: Zosyn 4/12 > Vanc 4/12 > Oral Vanc 4/12 >   DVT prophylaxis: SCDs + warfarin   Devices    LINES /  TUBES:      Continuous Infusions: . sodium chloride 40 mL/hr at 12/20/14 0919  . amiodarone 30 mg/hr (12/20/14 1300)  . heparin 2,350 Units/hr (12/20/14 1300)    Objective: VITAL SIGNS: Temp: 98.1 F (36.7 C) (04/24 1605) Temp Source: Oral (04/24 1605) BP: 100/61 mmHg (04/24 1342) Pulse Rate: 107 (04/24 1300) SPO2; FIO2:   Intake/Output Summary (Last 24 hours) at 12/20/14 1843 Last data filed at 12/20/14 1300  Gross per 24 hour  Intake 1429.6 ml  Output      5 ml  Net 1424.6 ml     Exam: General: A/O 4, mild acute respiratory distress Lungs: Clear to auscultation bilaterally without wheezes or crackles Cardiovascular: Tachycardic, Regular rhythm without murmur gallop or rub normal S1 and S2 Abdomen: Nontender, nondistended, soft, bowel sounds positive, no rebound, no ascites, no appreciable mass Extremities: No significant cyanosis, clubbing. Left foot covered and clean, mild serosanguineous fluid draining, negative purulent foul smelling drainage; toes cold, blue however patient able to move all toes on command. Patient's third left metatarsal black cold to touch, unable to move on command. Right AKA negative ulcerations well-healed scar.  Data Reviewed: Basic Metabolic Panel:  Recent Labs Lab 12/14/14 0227 12/15/14 0320 12/16/14 0841 12/11/2014 0345 12/19/14 0250 12/19/14 2015 12/20/14 0255  NA 139 141 137 139 136  --  135  K 4.8 3.6 4.2 4.1 3.1*  --  3.7  CL 102 99 97 100 95*  --  99  CO2 20 27 26 24 26   --  23  GLUCOSE 128* 103* 109* 95 83  --  110*  BUN 22 14 23 16 10   --  6  CREATININE 4.42* 3.34* 4.81* 4.31* 3.21*  --  2.70*  CALCIUM 8.5 8.6 8.5 8.6 8.5  --  8.1*  MG  --   --   --   --   --  1.8 1.7  PHOS 3.4 2.9 2.9  --   --   --   --    Liver Function Tests:  Recent Labs Lab 12/14/14 0227 12/15/14 0320 12/16/14 0841 12/20/14 0255  AST  --   --   --  50*  ALT  --   --   --  17  ALKPHOS  --   --   --  96  BILITOT  --   --   --  9.1*  PROT   --   --   --  6.2  ALBUMIN 1.5* 1.7* 1.5* 1.3*   No results for input(s): LIPASE, AMYLASE in the last 168 hours. No results for input(s): AMMONIA in the last 168 hours. CBC:  Recent Labs Lab 12/15/14 0320 12/12/2014 0345 12/26/2014 0356 12/19/14 0250 12/20/14 0255  WBC 24.1* 23.9* 25.2* 27.4* 28.1*  NEUTROABS  --   --   --   --  24.7*  HGB 9.1* 9.5* 9.5* 9.1* 9.6*  HCT 28.0* 29.0* 28.1* 26.9* 28.7*  MCV 80.7 79.5 77.2* 76.2* 77.4*  PLT 281 311 284 268 263   Cardiac Enzymes: No results for input(s): CKTOTAL, CKMB, CKMBINDEX, TROPONINI in the last 168 hours. BNP (last 3 results) No results for input(s): BNP in the last 8760 hours.  ProBNP (last 3 results)  Recent Labs  08/15/14 0245  PROBNP >70000.0*  CBG:  Recent Labs Lab 12/19/14 1846 12/19/14 2121 12/20/14 0742 12/20/14 1118 12/20/14 1516  GLUCAP 105* 111* 97 113* 121*    Recent Results (from the past 240 hour(s))  Clostridium Difficile by PCR     Status: Abnormal   Collection Time: 12/11/14 10:12 AM  Result Value Ref Range Status   C difficile by pcr POSITIVE (A) NEGATIVE Final    Comment: CRITICAL RESULT CALLED TO, READ BACK BY AND VERIFIED WITH: S.HEATH AT 9021 ON 12/11/14 BY S.VANHOORNE      Studies:  Recent x-ray studies have been reviewed in detail by the Attending Physician  Scheduled Meds:  Scheduled Meds: . allopurinol  100 mg Oral Daily  . antiseptic oral rinse  7 mL Mouth Rinse BID  . atorvastatin  20 mg Oral q1800  . darbepoetin (ARANESP) injection - DIALYSIS  100 mcg Intravenous Q Sat-HD  . famotidine  20 mg Oral Daily  . FLUoxetine  20 mg Oral Daily  . insulin aspart  0-5 Units Subcutaneous QHS  . insulin aspart  0-9 Units Subcutaneous TID WC  . midodrine  10 mg Oral TID WC  . piperacillin-tazobactam (ZOSYN)  IV  2.25 g Intravenous Q8H  . traZODone  50 mg Oral QHS  . vancomycin  125 mg Oral QID  . vancomycin  1,000 mg Intravenous Q T,Th,Sa-HD  . Warfarin - Pharmacist Dosing  Inpatient   Does not apply q1800    Time spent on care of this patient: 40 mins   Hazael Olveda, Geraldo Docker , MD  [facility] Office  [phone] Pager - [phone]  On-Call/Text Page:      [email or web address]      password [email or web address]  If 7PM-7AM, please contact night-coverage [email or web address] Password [facility] 12/20/2014, 6:43 PM   LOS: 12 days   Care during the described time interval was provided by me .  I have reviewed this patient's available data, including medical history, events of note, physical examination, radiology studies and test results as part of my evaluation  Dia Crawford, MD [phone] Pager

## 2014-12-20 NOTE — Progress Notes (Signed)
Rt Note:  Pt refusing bipap at this time.  Rt will check again closer to midnight.

## 2014-12-20 NOTE — Progress Notes (Signed)
ANTICOAGULATION/ANTIBIOTIC CONSULT NOTE  Pharmacy Consult for Warfarin/heparin, Vanc/Zosyn Indication: atrial fibrillation / cellulitis  Allergies  Allergen Reactions  . Bacitracin Other (See Comments)    unknown  . Norvasc [Amlodipine Besylate] Other (See Comments)    unknown    Patient Measurements: Height: 6\' 4"  (193 cm) Weight: 196 lb 10.4 oz (89.2 kg) IBW/kg (Calculated) : 86.8   Vital Signs: Temp: 98.5 F (36.9 C) (04/24 0800) Temp Source: Oral (04/24 0800) BP: 88/47 mmHg (04/24 0738) Pulse Rate: 94 (04/24 0610)  Labs:  Recent Labs  12/10/2014 0356  12/19/14 0250 12/19/14 0910 12/19/14 2015 12/20/14 0255  HGB 9.5*  --  9.1*  --   --  9.6*  HCT 28.1*  --  26.9*  --   --  28.7*  PLT 284  --  268  --   --  263  APTT 77*  --  >200*  --   --   --   LABPROT 19.2*  --  21.4*  --   --  24.1*  INR 1.60*  --  1.83*  --   --  2.14*  HEPARINUNFRC  --   < > 0.24* 0.38 0.37 0.31  CREATININE  --   --  3.21*  --   --  2.70*  < > = values in this interval not displayed.  Estimated Creatinine Clearance: 33.5 mL/min (by C-G formula based on Cr of 2.7).   Medical History: Past Medical History  Diagnosis Date  . UGI bleed 02/12/11    On anticoagulation; EGD w/snare polypectomy-multiple polypoid lesions antrum(benign), Chronic active gastritis (NEGATIVE H pylori)  . Anemia, chronic disease   . Sepsis due to enterococcus 02/11/11  . Paroxysmal atrial fibrillation     On coumadin  . Coronary atherosclerosis of native coronary artery     a. Multivessel status post CABG 2008.  . Cardiomyopathy   . Type 2 diabetes mellitus   . Essential hypertension, benign   . Hyperlipemia   . Gout   . S/P patent foramen ovale closure   . Hyperbilirubinemia 08/10/2013  . SBO (small bowel obstruction) 01/2014  . 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 Clostridium difficile colitis 01/2014  . Diverticulosis   . Internal hemorrhoids   . ESRD on hemodialysis     Started 2008-2009. Dr Hinda Lenis on  a TTS schedule - R forearm AVF for several years    . Orthostatic hypotension   . S/P mitral valve replacement with bioprosthetic valve     a. 2008 at time of CABG.  . Chronic systolic CHF (congestive heart failure)     a. Last EF 15% in 07/2014.  Marland Kitchen Loculated pleural effusion     a. chronic right loculated pleural effusion s/p chest tube 10/2014.  Marland Kitchen RBBB     a. Intermittent.     Medications:  Scheduled:  . allopurinol  100 mg Oral Daily  . antiseptic oral rinse  7 mL Mouth Rinse BID  . atorvastatin  20 mg Oral q1800  . darbepoetin (ARANESP) injection - DIALYSIS  100 mcg Intravenous Q Sat-HD  . famotidine  20 mg Oral Daily  . FLUoxetine  20 mg Oral Daily  . insulin aspart  0-5 Units Subcutaneous QHS  . insulin aspart  0-9 Units Subcutaneous TID WC  . midodrine  10 mg Oral TID WC  . piperacillin-tazobactam (ZOSYN)  IV  2.25 g Intravenous Q8H  . traZODone  50 mg Oral QHS  . vancomycin  125 mg Oral QID  .  vancomycin  1,000 mg Intravenous Q T,Th,Sa-HD  . Warfarin - Pharmacist Dosing Inpatient   Does not apply q1800    Assessment: 37 yoM with ESED on HD on coumadin PTA for hx Afib. Continuing on heparin bridge. HL is now therapeutic x3 but at bottom of range (0.31). Will increase slightly to keep to range.  Coumadin was held since 4/15-4/20 for Fem-pop surgery on 4/17. Pharmacy consulted to restart coumadin but no doses given initally as INR was elevated (INR was 4.43 on 12/14/14). Vitamin K 10 mg IV given 4/19 for surgery - INR down to 1.6.  INR now therapeutic 1.83>>2.14 after 4 days of 0.5mg  coumadin.  No bleeding documented. Hg 9.6, plt stable wnl. D/c IV heparin with one more therapeutic INR.  Documented Home Dose: 0.5 mg daily except on Sunday and Wednesday, no coumadin.  Pt continues on vanc/zosyn for cellulitis. MRI neg for osteo. S/p I&D of L foot with 3rd ray amputation 4/17. Afeb, LA 2.2>>1.5, wbc 28.1 (stable). Further ortho intervention 4/25 or 4/26 per MD note - to  continue BS antibiotics as purulent material noted during surgery. Received vanc post-HD on 4/23. Tolerated 3.5h at BFR of only 150. Next dose due Tuesday.  4/12 Zosyn>> 4/12 Vanc>> 4/15 Vanc PO>>  4/12 Bld Cx>>NGF 4/15 C diff>>Positive  Goal of Therapy:  Heparin level 0.3-0.7 units/ml INR 2-3 Monitor platelets by anticoagulation protocol: Yes  Eradication of infection   Plan:  -Vancomycin 1000 mg IV qTThS, next dose 4/26 -Zosyn 2.25 g IV q8h -F/u for HD schedule, clinical progress, abx plan -Further ortho intervention possibly 4/25 or 4/26  -Heparin drip slight increase to @2350  units/hr - d/c with 1 more therapeutic INR -Coumadin 0.5 mg today. Hesitant to increase dose as was supratherapeutic previously -Daily HL/CBC -Daily PT/INR -Mon s/sx bleed  Elicia Lamp, PharmD Clinical Pharmacist - Resident Pager [phone] 12/20/2014 8:35 AM

## 2014-12-20 NOTE — Discharge Instructions (Signed)

## 2014-12-21 ENCOUNTER — Other Ambulatory Visit (HOSPITAL_COMMUNITY): Payer: Self-pay | Admitting: Orthopedic Surgery

## 2014-12-21 LAB — COMPREHENSIVE METABOLIC PANEL
ALT: 19 U/L (ref 0–53)
ANION GAP: 16 — AB (ref 5–15)
AST: 49 U/L — AB (ref 0–37)
Albumin: 1.3 g/dL — ABNORMAL LOW (ref 3.5–5.2)
Alkaline Phosphatase: 90 U/L (ref 39–117)
BUN: 12 mg/dL (ref 6–23)
CHLORIDE: 100 mmol/L (ref 96–112)
CO2: 21 mmol/L (ref 19–32)
Calcium: 8.3 mg/dL — ABNORMAL LOW (ref 8.4–10.5)
Creatinine, Ser: 3.87 mg/dL — ABNORMAL HIGH (ref 0.50–1.35)
GFR calc Af Amer: 17 mL/min — ABNORMAL LOW (ref >90)
GFR, EST NON AFRICAN AMERICAN: 15 mL/min — AB (ref >90)
Glucose, Bld: 108 mg/dL — ABNORMAL HIGH (ref 70–99)
Potassium: 3.6 mmol/L (ref 3.5–5.1)
SODIUM: 137 mmol/L (ref 135–145)
TOTAL PROTEIN: 6.8 g/dL (ref 6.0–8.3)
Total Bilirubin: 9.9 mg/dL — ABNORMAL HIGH (ref 0.3–1.2)

## 2014-12-21 LAB — CBC WITH DIFFERENTIAL/PLATELET
BASOS ABS: 0 10*3/uL (ref 0.0–0.1)
Basophils Relative: 0 % (ref 0–1)
EOS PCT: 1 % (ref 0–5)
Eosinophils Absolute: 0.3 10*3/uL (ref 0.0–0.7)
HCT: 28 % — ABNORMAL LOW (ref 39.0–52.0)
HEMOGLOBIN: 9 g/dL — AB (ref 13.0–17.0)
LYMPHS ABS: 1.1 10*3/uL (ref 0.7–4.0)
Lymphocytes Relative: 4 % — ABNORMAL LOW (ref 12–46)
MCH: 25.7 pg — AB (ref 26.0–34.0)
MCHC: 32.1 g/dL (ref 30.0–36.0)
MCV: 80 fL (ref 78.0–100.0)
MONOS PCT: 7 % (ref 3–12)
Monocytes Absolute: 2 10*3/uL — ABNORMAL HIGH (ref 0.1–1.0)
Neutro Abs: 24.9 10*3/uL — ABNORMAL HIGH (ref 1.7–7.7)
Neutrophils Relative %: 88 % — ABNORMAL HIGH (ref 43–77)
Platelets: 260 10*3/uL (ref 150–400)
RBC: 3.5 MIL/uL — ABNORMAL LOW (ref 4.22–5.81)
RDW: 23.6 % — ABNORMAL HIGH (ref 11.5–15.5)
WBC: 28.3 10*3/uL — AB (ref 4.0–10.5)

## 2014-12-21 LAB — MAGNESIUM: Magnesium: 1.8 mg/dL (ref 1.5–2.5)

## 2014-12-21 LAB — GLUCOSE, CAPILLARY
GLUCOSE-CAPILLARY: 98 mg/dL (ref 70–99)
GLUCOSE-CAPILLARY: 104 mg/dL — AB (ref 70–99)
Glucose-Capillary: 99 mg/dL (ref 70–99)
Glucose-Capillary: 109 mg/dL — ABNORMAL HIGH (ref 70–99)

## 2014-12-21 LAB — PROTIME-INR
INR: 3.14 — ABNORMAL HIGH (ref 0.00–1.49)
Prothrombin Time: 32.5 seconds — ABNORMAL HIGH (ref 11.6–15.2)

## 2014-12-21 LAB — HEPARIN LEVEL (UNFRACTIONATED): Heparin Unfractionated: 0.49 IU/mL (ref 0.30–0.70)

## 2014-12-21 LAB — CORTISOL-AM, BLOOD: CORTISOL - AM: 21.5 ug/dL (ref 4.3–22.4)

## 2014-12-21 MED ORDER — ZOLPIDEM TARTRATE 5 MG PO TABS
5.0000 mg | ORAL_TABLET | Freq: Every evening | ORAL | Status: DC | PRN
  Administered 2014-12-21: 5 mg via ORAL
  Filled 2014-12-21: qty 1

## 2014-12-21 MED ORDER — TRAZODONE 25 MG HALF TABLET
25.0000 mg | ORAL_TABLET | Freq: Every day | ORAL | Status: DC
  Administered 2014-12-21: 25 mg via ORAL
  Filled 2014-12-21 (×3): qty 1

## 2014-12-21 NOTE — Progress Notes (Signed)
Medicare Important Message given? YES (If response is "NO", the following Medicare IM given date fields will be blank) Date Medicare IM given:12/21/2014 Medicare IM given by: Tyianna Menefee 

## 2014-12-21 NOTE — Progress Notes (Signed)
RT Note:  Pt off bipap at this time.  RT will continue to monitor.

## 2014-12-21 NOTE — Progress Notes (Signed)
Patient ID: [name] [name], male   DOB: 1951/03/13, 64 y.o.   MRN: [id number] Patient ID: [name] [name], male   DOB: Mar 23, 1951, 64 y.o.   MRN: [id number] Advanced Heart Failure Rounding Note   Subjective:    Mr [name] [name] is a 64 y/o M with CAD s/p CABG in 2008 with bioprosthetic mitral valve replacement at that time, ESRD on HD, prior GI bleed, HTN, HLD, PAF on oral amiodarone/Coumadin (s/p TEE/DCCV in 24/4010), chronic systolic dysfunction (last EF 15% in 07/2014), anemia, R AKA, chronic right loculated pleural effusion s/p chest tube 10/2014 .   Admitted to [facility] with necrotic L foot and A fib RVR. Placed on antibiotics.  He was started on cardizem that was later stopped and he was placed on amiodarone drip after he was evaluated by cardiology.  He transferred to [facility] on April 18 due to increased surgical risk.  BB stopped with hypotension.   He went to OR on 4/17 and had 3rd ray amputation. Over night he developed respiratory distress and placed on increased oxygen and now on BiPap.  CXR with Increased pulmonary edema and R and L pleural effusions --R>L. Hypotensive with SBP in 80s initially.    TEE-guided DCCV on 4/22.  Back in atrial fibrillation by 4/24.  Remains in atrial fibrillation today.   Stable today, SBP 90s.  Not short of breath at rest.  Remains in atrial fibrillation on amiodarone gtt, HR 90s.      Objective:   Weight Range:  Vital Signs:   Temp:  [97.6 F (36.4 C)-98.7 F (37.1 C)] 98 F (36.7 C) (04/25 0300) Pulse Rate:  [64-117] 96 (04/25 0700) Resp:  [15-30] 24 (04/25 0700) BP: (82-100)/(45-70) 92/47 mmHg (04/25 0700) SpO2:  [91 %-99 %] 97 % (04/25 0700) Weight:  [198 lb 3.1 oz (89.9 kg)] 198 lb 3.1 oz (89.9 kg) (04/25 0300) Last BM Date: 12/20/14  Weight change: Filed Weights   12/19/14 1805 12/20/14 0316 12/21/14 0300  Weight: 195 lb 1.7 oz (88.5 kg) 196 lb 10.4 oz (89.2 kg) 198 lb 3.1 oz (89.9 kg)    Intake/Output:   Intake/Output Summary (Last 24 hours) at 12/21/14  0728 Last data filed at 12/21/14 0600  Gross per 24 hour  Intake 1515.6 ml  Output      5 ml  Net 1510.6 ml     Physical Exam: General:  Chronically ill appearing. On BiPAP HEENT: normal Neck: supple. JVP 10 cm.  Carotids 2+ bilat; no bruits. No lymphadenopathy or thryomegaly appreciated. Cor: PMI nondisplaced. Regular rate & rhythm. No rubs, gallops or murmurs. Lungs: Decreased breath sounds RLL LLL Abdomen: soft, nontender, nondistended. No hepatosplenomegaly. No bruits or masses. Good bowel sounds. Extremities: no cyanosis, clubbing, rash, edema R AKA LLE foot dressing in tact Neuro: drowsy but does arouse. On Bipap. Has R AKA. moves all 4 extremities w/o difficulty. Affect pleasant  Telemetry:  A Fib 90s  Labs: Basic Metabolic Panel:  Recent Labs Lab 12/15/14 0320 12/16/14 0841 12/11/2014 0345 12/19/14 0250 12/19/14 2015 12/20/14 0255 12/21/14 0305  NA 141 137 139 136  --  135 137  K 3.6 4.2 4.1 3.1*  --  3.7 3.6  CL 99 97 100 95*  --  99 100  CO2 27 26 24 26   --  23 21  GLUCOSE 103* 109* 95 83  --  110* 108*  BUN 14 23 16 10   --  6 12  CREATININE 3.34* 4.81* 4.31* 3.21*  --  2.70*  3.87*  CALCIUM 8.6 8.5 8.6 8.5  --  8.1* 8.3*  MG  --   --   --   --  1.8 1.7 1.8  PHOS 2.9 2.9  --   --   --   --   --     Liver Function Tests:  Recent Labs Lab 12/15/14 0320 12/16/14 0841 12/20/14 0255 12/21/14 0305  AST  --   --  50* 49*  ALT  --   --  17 19  ALKPHOS  --   --  96 90  BILITOT  --   --  9.1* 9.9*  PROT  --   --  6.2 6.8  ALBUMIN 1.7* 1.5* 1.3* 1.3*   No results for input(s): LIPASE, AMYLASE in the last 168 hours. No results for input(s): AMMONIA in the last 168 hours.  CBC:  Recent Labs Lab 12/20/2014 0345 11/28/2014 0356 12/19/14 0250 12/20/14 0255 12/21/14 0305  WBC 23.9* 25.2* 27.4* 28.1* 28.3*  NEUTROABS  --   --   --  24.7* 24.9*  HGB 9.5* 9.5* 9.1* 9.6* 9.0*  HCT 29.0* 28.1* 26.9* 28.7* 28.0*  MCV 79.5 77.2* 76.2* 77.4* 80.0  PLT 311 284 268  263 260    Cardiac Enzymes: No results for input(s): CKTOTAL, CKMB, CKMBINDEX, TROPONINI in the last 168 hours.  BNP: BNP (last 3 results) No results for input(s): BNP in the last 8760 hours.  ProBNP (last 3 results)  Recent Labs  08/15/14 0245  PROBNP >70000.0*      Other results:  Imaging: No results found.   Medications:     Scheduled Medications: . allopurinol  100 mg Oral Daily  . antiseptic oral rinse  7 mL Mouth Rinse BID  . atorvastatin  20 mg Oral q1800  . darbepoetin (ARANESP) injection - DIALYSIS  100 mcg Intravenous Q Sat-HD  . famotidine  20 mg Oral Daily  . FLUoxetine  20 mg Oral Daily  . insulin aspart  0-5 Units Subcutaneous QHS  . insulin aspart  0-9 Units Subcutaneous TID WC  . midodrine  10 mg Oral TID WC  . piperacillin-tazobactam (ZOSYN)  IV  2.25 g Intravenous Q8H  . traZODone  50 mg Oral QHS  . vancomycin  125 mg Oral QID  . vancomycin  1,000 mg Intravenous Q T,Th,Sa-HD  . Warfarin - Pharmacist Dosing Inpatient   Does not apply q1800    Infusions: . sodium chloride 40 mL/hr at 12/21/14 0440  . amiodarone 30 mg/hr (12/21/14 0600)    PRN Medications: sodium chloride, sodium chloride, acetaminophen, feeding supplement (NEPRO CARB STEADY), HYDROcodone-acetaminophen, lidocaine (PF), lidocaine-prilocaine, metoprolol, ondansetron **OR** ondansetron (ZOFRAN) IV, pentafluoroprop-tetrafluoroeth   Assessment/Plan    1. Acute Respiratory Failure: Pulmonary edema with acute/chronic systolic CHF.  Now on nasal cannula.  2. Hypotension: Has history of orthostatic hypotension in setting of ischemic cardiomyopathy and was on midodrine 10 mg twice a day at home. He has had trouble tolerating cardiac meds historically.  Suspect hypotension in the hospital may have been combination of cardiogenic and septic/vasodilatory shock.  BP now better, SBP 90s-100s. - No need for pressors at this point.  - Some degree of hypotension has been a [place]-standing  problem for Mr [name] [name].  Would continue midodrine at home dose.     3. Persistent atrial fibrillation with mild RVR: TEE-DCCV 4/22, back in atrial fibrillation now.  Noted plan for left AKA Tuesday or Wednesday.  As he is rate-controlled and stable currently, would not re-attempt DCCV until  after surgery.  If he maintains therapeutic anticoagulation through surgery (which I think he will be able to do per Dr Jess Barters prior note), can try DCCV again post-op without TEE.  Can stop heparin gtt at this point, INR >3.  Continue amiodarone gtt.  4. Acute on chronic systolic CHF:  Ischemic cardiomyopathy, he has tolerated minimal cardiac meds at home.  EF 25-30% on TEE 4/22. Does not have ICD (suspect poor candidate given infection risk).  He remains volume overloaded on exam but getting better with HD.  3. History of bioprosthetic mitral valve replacement for severe MR: Valve ok on TEE.  4. CAD: s/p CABG.  Stable, no chest pain.  On home atorvastatin 20 daily.  5. 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 On vanc and zosyn.  Plan for left AKA this admission.  6. End-stage renal disease on hemodialysis: As above, needs fluid removal via HD.  7. Pleural Effusions R> L 8. C difficile colitis: On po vancomycin.   Length of Stay: [address]  12/21/2014, 7:28 AM  Advanced Heart Failure Team Pager [phone] (M-F; 7a - 4p)  Please contact [facility] for night-coverage after hours (4p -7a ) and weekends on [email or web address]

## 2014-12-21 NOTE — Progress Notes (Signed)
Admit: 12/18/2014 LOS: 13  12M ESRD ([facility]) admitted with L foot wound / PAD, chronic sHF, AFib on amiodarone and coumadin, CAD s/p CABG, C Dif on PO Vanc  Subjective:  To have AKA  No other issues  04/24 0701 - 04/25 0700 In: 1515.6 [P.O.:120; I.V.:1245.6; IV Piggyback:150] Out: 5 [Stool:5]  Filed Weights   12/19/14 1805 12/20/14 0316 12/21/14 0300  Weight: 88.5 kg (195 lb 1.7 oz) 89.2 kg (196 lb 10.4 oz) 89.9 kg (198 lb 3.1 oz)    Scheduled Meds: . allopurinol  100 mg Oral Daily  . antiseptic oral rinse  7 mL Mouth Rinse BID  . atorvastatin  20 mg Oral q1800  . darbepoetin (ARANESP) injection - DIALYSIS  100 mcg Intravenous Q Sat-HD  . famotidine  20 mg Oral Daily  . FLUoxetine  20 mg Oral Daily  . insulin aspart  0-5 Units Subcutaneous QHS  . insulin aspart  0-9 Units Subcutaneous TID WC  . midodrine  10 mg Oral TID WC  . piperacillin-tazobactam (ZOSYN)  IV  2.25 g Intravenous Q8H  . traZODone  25 mg Oral QHS  . vancomycin  125 mg Oral QID  . vancomycin  1,000 mg Intravenous Q T,Th,Sa-HD  . Warfarin - Pharmacist Dosing Inpatient   Does not apply q1800   Continuous Infusions: . sodium chloride 10 mL/hr at 12/21/14 1057  . amiodarone 30 mg/hr (12/21/14 1045)   PRN Meds:.sodium chloride, sodium chloride, acetaminophen, feeding supplement (NEPRO CARB STEADY), HYDROcodone-acetaminophen, lidocaine (PF), lidocaine-prilocaine, metoprolol, ondansetron **OR** ondansetron (ZOFRAN) IV, pentafluoroprop-tetrafluoroeth, zolpidem  Current Labs: reviewed    Physical Exam:  Blood pressure 103/60, pulse 47, temperature 97.5 F (36.4 C), temperature source Oral, resp. rate 26, height 6\' 4"  (1.93 m), weight 89.9 kg (198 lb 3.1 oz), SpO2 93 %. NAD IRIR CTAB S/nt/nd R AKA, LLE bandaged +B/T of AVF  A/P 1. ESRD 1. Out[facility] 2. Cont on schedule 3. No heparin 4/26 2/2 possible AKA 2. HTN/Vol 1. Cont to challenge EDW 2. Trial of 85kg on next HD 3. Will need  larger change s/p AKA 4. On midodrine 3. AFib with RVR: Cardiolog following 4. Anemia 1. Aranesp 100 qSat 2. No Fe given infection 5. Chronic sHF: AHF Service following  Pearson Grippe MD 12/21/2014, 3:16 PM   Recent Labs Lab 12/15/14 0320 12/16/14 0841  12/19/14 0250 12/20/14 0255 12/21/14 0305  NA 141 137  < > 136 135 137  K 3.6 4.2  < > 3.1* 3.7 3.6  CL 99 97  < > 95* 99 100  CO2 27 26  < > 26 23 21   GLUCOSE 103* 109*  < > 83 110* 108*  BUN 14 23  < > 10 6 12   CREATININE 3.34* 4.81*  < > 3.21* 2.70* 3.87*  CALCIUM 8.6 8.5  < > 8.5 8.1* 8.3*  PHOS 2.9 2.9  --   --   --   --   < > = values in this interval not displayed.  Recent Labs Lab 12/19/14 0250 12/20/14 0255 12/21/14 0305  WBC 27.4* 28.1* 28.3*  NEUTROABS  --  24.7* 24.9*  HGB 9.1* 9.6* 9.0*  HCT 26.9* 28.7* 28.0*  MCV 76.2* 77.4* 80.0  PLT 268 263 260

## 2014-12-21 NOTE — Progress Notes (Signed)
Patient ID: [name] [name], male   DOB: 06/18/1951, 64 y.o.   MRN: [id number] Patient has badly diseased popliteal artery with poor runoff on the left. Patient will require an above-the-knee amputation. I discussed the patient's care with his wife last night. She agrees to proceeding with surgery. We will plan for surgery either Tuesday or Wednesday.

## 2014-12-21 NOTE — Progress Notes (Signed)
[place] TEAM 1 - Stepdown/ICU TEAM Progress Note  [name] [name] OZH:[id number] DOB: October 22, 1950 DOA: 12/07/2014 PCP: [name], MD  Admit HPI / Brief Narrative: 64 year old male with history of CAD status post CABG 2008 with mitral valve replacement (bioprostetic), diabetes mellitus, atrial fibrillation on Coumadin and amiodarone status post cardioversion in 2015, ESRD, chronic systolic heart failure with an EF of 15% in 2015, right AKA, and a right chronic loculated pleural effusion who came to [facility] for left foot pain and was found to have cellulitis and gangrene with severe sepsis (lactic acid of 6).   MRI showed deep ulceration but no evidence of osteo.  ABIs showed a heavily diseased popliteal artery with occlusion in the mid segmentand very limited flow within the runoff vessels.  On 04/14 his RVR became hard to control andhe was started on IV amiodarone and Cardiology was consulted.  04/14 after after dialysis he became hypotensive and had to be given multiple boluses of normal saline. Cardiology and IM deemed him high risk of surgery and he was therefore transferred to [facility].  HPI/Subjective: The patient is resting comfortably in bed.  He denies any new complaints.  He does state that he is having trouble sleeping at night.  He presently denies chest pain fevers chills nausea vomiting or abdominal pain.  Assessment/Plan:  Left lower extremity cellulitis with gangrene and ulceration / necrotic 3rd toe Orthopedics following - status post I and D of left foot with third ray amputation 4/17 but wound concerning for developing necrosis of 4th toe - continue empiric antibiotics - Ortho Evra now plans to return to operating room for AKA this week  Cardiomyopathy / severe chronic systolic congestive heart failure EF 15% December 2015 TEE Acute decompensation with pulmonary edema due to volume expansion in attempt to treat RVR - follow daily weights and strict Is - not a  candidate for ACE inhibitor due to baseline hypotension - baseline weight appears to be approximately 80 kg - now up 10 kg - CHF team following - patient is in no respiratory distress today - ongoing volume management per hemodialysis  Persistent atrial fibrillation/flutter with RVR  heart rate proving very difficult to control - Cardiology dosing his medical therapy - anticoagulation per cardiology - Cardiology completed TEE [facility] 4/22 but patient now back in atrial fibrillation - planned to attempt a repeat [facility] post amputation  Chronic hypotension Blood pressure stable at patient's apparent baseline  End-stage renal disease on hemodialysis - Tu/Th/Sat  Nephrology following - patient tolerating dialysis well  CAD Multivessel status post CABG x4 2008 Cardiology following - not a candidate for beta blockers or nitrates due to baseline hypotension  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 difficile colitis Continue oral vancomycin as per C. difficile treatment protocol - no evidence at present of acute complications - we will need to continue oral vancomycin with a minimum of 7-10 days after discontinuation of all other antibiotics  Type 2 diabetes mellitus with nephropathy CBGs currently well controlled  Code Status: FULL Family Communication: No family present at time of exam today Disposition Plan: SDU  Consultants: Nephrology Cardiology  Orthopedics  CHF Team  Procedures: 4/17 I&D Lt foot - Lt foot third ray amputation 4/22 TEE w/ [facility]  Antibiotics: Zosyn 4/12 > Vanc 4/12 > Oral Vanc 4/12 >  DVT prophylaxis: Warfarin  Objective: Blood pressure 90/50, pulse  94, temperature 98.6 F (37 C), temperature source Oral, resp. rate 26, height 6\' 4"  (1.93 m), weight 89.9 kg (198 lb 3.1 oz), SpO2 94 %.  Intake/Output Summary (Last 24 hours) at 12/21/14 1004 Last data  filed at 12/21/14 0600  Gross per 24 hour  Intake 1282.5 ml  Output      5 ml  Net 1277.5 ml   Exam: General: No acute respiratory distress - alert and conversant - appears comfortable Lungs: Clear to auscultation throughout without wheeze or focal crackles Cardiovascular: Irregularly irregular - no appreciable murmur or rub - distant heart sounds Abdomen: Nontender, nondistended, soft, bowel sounds positive, no rebound, no ascites, no appreciable mass Extremities: Status post right AKA - left foot dressed and dry   Data Reviewed: Basic Metabolic Panel:  Recent Labs Lab 12/15/14 0320 12/16/14 0841 12/16/2014 0345 12/19/14 0250 12/19/14 2015 12/20/14 0255 12/21/14 0305  NA 141 137 139 136  --  135 137  K 3.6 4.2 4.1 3.1*  --  3.7 3.6  CL 99 97 100 95*  --  99 100  CO2 27 26 24 26   --  23 21  GLUCOSE 103* 109* 95 83  --  110* 108*  BUN 14 23 16 10   --  6 12  CREATININE 3.34* 4.81* 4.31* 3.21*  --  2.70* 3.87*  CALCIUM 8.6 8.5 8.6 8.5  --  8.1* 8.3*  MG  --   --   --   --  1.8 1.7 1.8  PHOS 2.9 2.9  --   --   --   --   --     Liver Function Tests:  Recent Labs Lab 12/15/14 0320 12/16/14 0841 12/20/14 0255 12/21/14 0305  AST  --   --  50* 49*  ALT  --   --  17 19  ALKPHOS  --   --  96 90  BILITOT  --   --  9.1* 9.9*  PROT  --   --  6.2 6.8  ALBUMIN 1.7* 1.5* 1.3* 1.3*   Coags:  Recent Labs Lab 12/16/2014 0345 12/03/2014 0356 12/19/14 0250 12/20/14 0255 12/21/14 0305  INR 1.49 1.60* 1.83* 2.14* 3.14*   CBC:  Recent Labs Lab 12/09/2014 0345 11/29/2014 0356 12/19/14 0250 12/20/14 0255 12/21/14 0305  WBC 23.9* 25.2* 27.4* 28.1* 28.3*  NEUTROABS  --   --   --  24.7* 24.9*  HGB 9.5* 9.5* 9.1* 9.6* 9.0*  HCT 29.0* 28.1* 26.9* 28.7* 28.0*  MCV 79.5 77.2* 76.2* 77.4* 80.0  PLT 311 284 268 263 260   CBG:  Recent Labs Lab 12/20/14 0742 12/20/14 1118 12/20/14 1516 12/20/14 2105 12/21/14 0759  GLUCAP 97 113* 121* 115* 98    Recent Results (from the past  240 hour(s))  Clostridium Difficile by PCR     Status: Abnormal   Collection Time: 12/11/14 10:12 AM  Result Value Ref Range Status   C difficile by pcr POSITIVE (A) NEGATIVE Final    Comment: CRITICAL RESULT CALLED TO, READ BACK BY AND VERIFIED WITH: S.HEATH AT 1538 ON 12/11/14 BY S.VANHOORNE      Studies:   Recent x-ray studies have been reviewed in detail by the Attending Physician  Scheduled Meds:  Scheduled Meds: . allopurinol  100 mg Oral Daily  . antiseptic oral rinse  7 mL Mouth Rinse BID  . atorvastatin  20 mg Oral q1800  . darbepoetin (ARANESP) injection - DIALYSIS  100 mcg Intravenous Q Sat-HD  . famotidine  20  mg Oral Daily  . FLUoxetine  20 mg Oral Daily  . insulin aspart  0-5 Units Subcutaneous QHS  . insulin aspart  0-9 Units Subcutaneous TID WC  . midodrine  10 mg Oral TID WC  . piperacillin-tazobactam (ZOSYN)  IV  2.25 g Intravenous Q8H  . traZODone  50 mg Oral QHS  . vancomycin  125 mg Oral QID  . vancomycin  1,000 mg Intravenous Q T,Th,Sa-HD  . Warfarin - Pharmacist Dosing Inpatient   Does not apply q1800    Time spent on care of this patient: 35 mins   [facility] T , MD   [facility] Office  [phone] Pager - Text Page per Amion as per below:  On-Call/Text Page:      [email or web address]      password [email or web address]  If 7PM-7AM, please contact night-coverage [email or web address] Password [email or web address] 12/21/2014, 10:04 AM   LOS: 13 days

## 2014-12-21 NOTE — Progress Notes (Signed)
[place] for warfarin Indication: atrial fibrillation   Allergies  Allergen Reactions  . Bacitracin Other (See Comments)    unknown  . Norvasc [Amlodipine Besylate] Other (See Comments)    unknown    Patient Measurements: Height: 6\' 4"  (193 cm) Weight: 198 lb 3.1 oz (89.9 kg) IBW/kg (Calculated) : 86.8   Vital Signs: Temp: 97.5 F (36.4 C) (04/25 1100) Temp Source: Oral (04/25 1100) BP: 103/60 mmHg (04/25 1100) Pulse Rate: 47 (04/25 1100)  Labs:  Recent Labs  12/19/14 0250  12/19/14 2015 12/20/14 0255 12/21/14 0305  HGB 9.1*  --   --  9.6* 9.0*  HCT 26.9*  --   --  28.7* 28.0*  PLT 268  --   --  263 260  APTT >200*  --   --   --   --   LABPROT 21.4*  --   --  24.1* 32.5*  INR 1.83*  --   --  2.14* 3.14*  HEPARINUNFRC 0.24*  < > 0.37 0.31 0.49  CREATININE 3.21*  --   --  2.70* 3.87*  < > = values in this interval not displayed.  Estimated Creatinine Clearance: 23.4 mL/min (by C-G formula based on Cr of 3.87).  Assessment: 57 yoM with ESED on HD on coumadin PTA for hx Afib. Was on a heparin drip, but stopped this morning with therapeutic/high INR.  Coumadin was held since 4/15-4/20 for Fem-pop surgery on 4/17. Pharmacy consulted to restart coumadin but no doses given initally as INR was elevated (INR was 4.43 on 12/14/14). Vitamin K 10 mg IV given 4/19 for surgery - INR down to 1.6.  INR now SUPRAtherapeutic at 3.14 after 5 days of warfarin 0.5mg . Came up to >3 quickly.  No bleeding documented.  Hg 9.9, plt stable and wnl.   Documented Home Dose: 0.5 mg daily except NO WARFARIN on Sunday and Wednesday  Goal of Therapy:  INR 2-3 Monitor platelets by anticoagulation protocol: Yes    Plan:  -hold warfarin tonight with elevated INR. Likely will still require a skipped day or two to keep in therapeutic range. Appears to be very sensitive. -Daily PT/INR and CBC -Mon s/sx bleed  Uno Esau D. Ramisa Duman, PharmD, BCPS Clinical  Pharmacist Pager: [phone] 12/21/2014 11:15 AM

## 2014-12-22 ENCOUNTER — Inpatient Hospital Stay (HOSPITAL_COMMUNITY): Payer: Medicare Other

## 2014-12-22 ENCOUNTER — Encounter (HOSPITAL_COMMUNITY): Payer: Self-pay | Admitting: Cardiology

## 2014-12-22 LAB — BASIC METABOLIC PANEL
ANION GAP: 16 — AB (ref 5–15)
Anion gap: 20 — ABNORMAL HIGH (ref 5–15)
BUN: 20 mg/dL (ref 6–23)
BUN: 10 mg/dL (ref 6–23)
CO2: 19 mmol/L (ref 19–32)
CO2: 22 mmol/L (ref 19–32)
CREATININE: 4.88 mg/dL — AB (ref 0.50–1.35)
Calcium: 8.5 mg/dL (ref 8.4–10.5)
Calcium: 8 mg/dL — ABNORMAL LOW (ref 8.4–10.5)
Chloride: 97 mmol/L (ref 96–112)
Chloride: 95 mmol/L — ABNORMAL LOW (ref 96–112)
Creatinine, Ser: 3.08 mg/dL — ABNORMAL HIGH (ref 0.50–1.35)
GFR calc Af Amer: 13 mL/min — ABNORMAL LOW (ref >90)
GFR, EST AFRICAN AMERICAN: 23 mL/min — AB (ref >90)
GFR, EST NON AFRICAN AMERICAN: 11 mL/min — AB (ref >90)
GFR, EST NON AFRICAN AMERICAN: 20 mL/min — AB (ref >90)
GLUCOSE: 91 mg/dL (ref 70–99)
Glucose, Bld: 201 mg/dL — ABNORMAL HIGH (ref 70–99)
Potassium: 3.9 mmol/L (ref 3.5–5.1)
Potassium: 3.9 mmol/L (ref 3.5–5.1)
SODIUM: 133 mmol/L — AB (ref 135–145)
Sodium: 136 mmol/L (ref 135–145)

## 2014-12-22 LAB — BLOOD GAS, ARTERIAL
Acid-base deficit: 0.7 mmol/L (ref 0.0–2.0)
BICARBONATE: 24.2 meq/L — AB (ref 20.0–24.0)
FIO2: 100 %
O2 SAT: 95 %
PCO2 ART: 45.3 mmHg — AB (ref 35.0–45.0)
PH ART: 7.347 — AB (ref 7.350–7.450)
Patient temperature: 98.6
TCO2: 25.6 mmol/L (ref 0–100)
pO2, Arterial: 89.9 mmHg (ref 80.0–100.0)

## 2014-12-22 LAB — CBC
HEMATOCRIT: 30 % — AB (ref 39.0–52.0)
Hemoglobin: 9.3 g/dL — ABNORMAL LOW (ref 13.0–17.0)
MCH: 25.8 pg — ABNORMAL LOW (ref 26.0–34.0)
MCHC: 31 g/dL (ref 30.0–36.0)
MCV: 83.3 fL (ref 78.0–100.0)
PLATELETS: 258 10*3/uL (ref 150–400)
RBC: 3.6 MIL/uL — AB (ref 4.22–5.81)
RDW: 23.4 % — ABNORMAL HIGH (ref 11.5–15.5)
WBC: 25.9 10*3/uL — ABNORMAL HIGH (ref 4.0–10.5)

## 2014-12-22 LAB — PROTIME-INR
INR: 3.25 — ABNORMAL HIGH (ref 0.00–1.49)
Prothrombin Time: 33.4 seconds — ABNORMAL HIGH (ref 11.6–15.2)

## 2014-12-22 LAB — GLUCOSE, CAPILLARY
Glucose-Capillary: 99 mg/dL (ref 70–99)
Glucose-Capillary: 94 mg/dL (ref 70–99)
Glucose-Capillary: 123 mg/dL — ABNORMAL HIGH (ref 70–99)

## 2014-12-22 LAB — TROPONIN I: Troponin I: 0.09 ng/mL — ABNORMAL HIGH (ref <0.031)

## 2014-12-22 MED ORDER — NEPRO/CARBSTEADY PO LIQD
237.0000 mL | ORAL | Status: DC
  Filled 2014-12-22 (×3): qty 237

## 2014-12-22 MED ORDER — PRO-STAT SUGAR FREE PO LIQD
30.0000 mL | Freq: Two times a day (BID) | ORAL | Status: DC
  Administered 2014-12-22: 30 mL via ORAL
  Filled 2014-12-22 (×4): qty 30

## 2014-12-22 MED ORDER — PHYTONADIONE 5 MG PO TABS
7.5000 mg | ORAL_TABLET | Freq: Once | ORAL | Status: DC
  Filled 2014-12-22: qty 2

## 2014-12-22 MED ORDER — MORPHINE SULFATE 2 MG/ML IJ SOLN
INTRAMUSCULAR | Status: AC
  Filled 2014-12-22: qty 1

## 2014-12-22 MED ORDER — MORPHINE SULFATE 2 MG/ML IJ SOLN
2.0000 mg | Freq: Once | INTRAMUSCULAR | Status: AC
  Administered 2014-12-22: 2 mg via INTRAVENOUS

## 2014-12-22 NOTE — Clinical Social Work Note (Signed)
Clinical Social Work Assessment  Patient Details  Name: [name] [name] MRN: [id number] Date of Birth: 14-Jan-1951  Date of referral:  12/22/14               Reason for consult:  Facility Placement                Housing/Transportation Living arrangements for the past 2 months:  Single Family Home Source of Information:  Spouse Patient Interpreter Needed:  None Criminal Activity/Legal Involvement Pertinent to Current Situation/Hospitalization:  No - Comment as needed Significant Relationships:  Spouse Lives with:  Spouse Do you feel safe going back to the place where you live?  Yes Need for family participation in patient care:  Yes (Comment)  Care giving concerns:  none   Facilities manager / plan:  CSW met with the pt's and his wife Enid Derry at the bedside.  CSW introduced self and purpose of the visit. CSW discussed rehab. CSW inquired about the geographical location in which the pt would like to receive rehab from. Enid Derry prefers [place]. CSW explained the SNF rehab process. CSW discussed insurance and its relation to SNF rehab. CSW answered all questions in which the [place] inquired about. CSW provided the CSW's contact information for further questions. CSW will continue to follow this pt and assist with discharge as needed.   Patient/Family's Response to care:  Pt and his wife was open to discussing SNF placement.   Patient/Family's Understanding of and Emotional Response to Diagnosis, Current Treatment, and Prognosis:  Pt remain quiet, but his wife Enid Derry become tearful about the pt's current prognosis. Enid Derry reported that she did not expect for the pt to have another AKA(left foot). Enid Derry expressed feeling anxiety about the pt ability to walk independently.   Emotional Assessment Appearance:  Appears stated age Attitude/Demeanor/Rapport:  Other (Appropriate ) Affect (typically observed):  Accepting, Pleasant Orientation:  Oriented to Self, Oriented to Place,  Oriented to  Time, Oriented to Situation Psych involvement (Current and /or in the community):  No (Comment)  Discharge Needs  Concerns to be addressed:  Denies Needs/Concerns at this time Current discharge risk:  None Barriers to Discharge:  No Barriers Identified   [place], MSW, [place]

## 2014-12-22 NOTE — Progress Notes (Signed)
   12/22/14 2005  BiPAP/CPAP/SIPAP  BiPAP/CPAP/SIPAP Pt Type Adult  Mask Type Full face mask  Mask Size Large  Set Rate 8 breaths/min  Respiratory Rate 20 breaths/min  IPAP 12 cmH20  EPAP 6 cmH2O  Oxygen Percent 60 %  Minute Ventilation 11  Leak 41  Peak Inspiratory Pressure (PIP) 13  Tidal Volume (Vt) 468  BiPAP/CPAP/SIPAP BiPAP  Patient Home Equipment No  Auto Titrate No  Press High Alarm 25 cmH2O  Press Low Alarm 5 cmH2O  BiPAP/CPAP /SiPAP Vitals  Pulse Rate 75  Resp 20  SpO2 98 %  Patient placed on BIPAP due to increase WOB and hypoxia.

## 2014-12-22 NOTE — Progress Notes (Signed)
Pt desat into 80s on 2Lo2, anxious and c/o SOB s/p vomiting. Increased to 6Lo2 and paged Baltazar Najjar, NP. Pt O2 sats remained in mid 80s, pt had expiratory wheezes/diminished in bases upon ausculation which cleared after pt coughed. Baltazar Najjar, NP ordered 2mg  of morphine and switch pt to nonrebreather. Pt's O2 sats increased to 90s but still extremely anxious and states "i cant breathe." Baltazar Najjar, NP at bedside, as well as portable xray, waiting for respiratory. Morphine given. Will continue to monitor.

## 2014-12-22 NOTE — Progress Notes (Signed)
[place] TEAM 1 - Stepdown/ICU TEAM Progress Note  [name] [name]:[id number] DOB: 06-12-1951 DOA: 12/22/2014 PCP: [name], MD  Admit HPI / Brief Narrative: 64 year old BM PMHx CAD status post CABG 2008 with mitral valve replacement (bioprostetic), diabetes mellitus, atrial fibrillation on Coumadin and amiodarone status post cardioversion in 2015, ESRD Tu/Th/Sat, chronic systolic heart failure with an EF of 15% in 2015, right AKA, and a right chronic loculated pleural effusion. Came to [facility] for left foot pain and was found to have cellulitis and gangrene with severe sepsis (lactic acid of 6).   MRI showed deep ulceration but no evidence of osteo. ABIs showed a heavily diseased popliteal artery with occlusion in the mid segmentand very limited flow within the runoff vessels.  On 04/14 his A. fib RVR became hard to control andhe was started on IV amiodarone and Cardiology was consulted. 04/14 after after dialysis he became hypotensive and had to be given multiple boluses of normal saline. Cardiology and IM deemed him high risk of surgery and he was therefore transferred to [facility].  HPI/Subjective: 4/26 A/O 4, states is aware that he will have surgery on Wednesday 4/27    Assessment/Plan:  Left lower extremity cellulitis with gangrene and ulceration / necrotic 3rd toe -Orthopedics following - status post I and D of left foot with third ray amputation  - continue empiric antibiotics for now as significant purulent material was encountered during the I&D  - Further surgery scheduled for 4/27 at 1145  -DC Coumadin for a.m. Surgery -Check INR in a.m. and reverse remaining INR with vitamin K goal<1.5  Cardiomyopathy / severe chronic systolic congestive heart failure EF 15% December 2015 TEE -Acute decompensation with pulmonary edema due to volume expansion in attempt to treat RVR  -Daily weights; on admission= 85.9 kg     4/24 weight= 88.8 kg -Strict I&O; since  admission +7.5 L - not a candidate for ACE inhibitor due to baseline hypertension  - baseline weight~80 kg;  Persistent atrial fibrillation/flutter with RVR  -Remains on amiodarone drip; heart rate proving very difficult to control  - 4/22 S/P DCCV  Chronic hypotension -4/24 patient continues to be significantly hypotensive we will bolus normal saline 250 ml 1 then start normal saline at 40 ml/hr -Will need to monitor very closely patient's fluid status given his extremely poor EF of 15%, and end-stage renal disease -Continue Midrin 10 mgTID.  -A.m. cortisol within normal limit  End-stage renal disease on hemodialysis - Tu/Th/Sat  -Nephrology following  -HD per nephrology; -Continue Midrin 10 mg TID  CAD in native artery; Multivessel status post CABG x4 2008 -Cardiology following - not a candidate for beta blockers or nitrates due to baseline hypotension  History of bioprosthetic mitral valve replacement 2008 with pericardial valve for severe MR  -Does not require anticoagulation as is a tissue valve   C. difficile colitis -Continue oral vancomycin as per C. difficile treatment protocol, no evidence at present of acute complications  Type 2 diabetes mellitus with nephropathy -CBGs currently well controlled  Hypokalemia -Potassium goal> 4        Code Status: FULL Family Communication: no family present at time of exam Disposition Plan: CIR vs SNF    Consultants: [facility]  Dr.Christopher [facility]  CHF Team   Procedure/Significant Events: 4/17 I &.D. Lt foot including excision/ debridement of necrotic skin and fascia.- Lt foot third ray amputation. 4/22 S/P DCCV  Culture  Antibiotics: Zosyn  4/12 > Vanc 4/12 > Oral Vanc 4/12 >   DVT prophylaxis: SCDs   Devices    LINES / TUBES:      Continuous Infusions: . sodium chloride 10 mL/hr at 12/21/14 1057  . amiodarone 30 mg/hr  (12/22/14 1226)    Objective: VITAL SIGNS: Temp: 97.8 F (36.6 C) (04/26 1630) Temp Source: Oral (04/26 1630) BP: 91/51 mmHg (04/26 1249) Pulse Rate: 68 (04/26 1249) SPO2; FIO2:   Intake/Output Summary (Last 24 hours) at 12/22/14 1709 Last data filed at 12/22/14 1300  Gross per 24 hour  Intake  520.6 ml  Output   1864 ml  Net -1343.4 ml     Exam: General: A/O 4, mild acute respiratory distress Lungs: Clear to auscultation bilaterally without wheezes or crackles Cardiovascular: Tachycardic, Regular rhythm without murmur gallop or rub normal S1 and S2 Abdomen: Nontender, nondistended, soft, bowel sounds positive, no rebound, no ascites, no appreciable mass Extremities: No significant cyanosis, clubbing. Left foot covered and clean, mild serosanguineous fluid draining, negative purulent foul smelling drainage; toes cold, blue however patient able to move all toes on command. Patient's third left metatarsal black cold to touch, unable to move on command. Right AKA negative ulcerations well-healed scar.  Data Reviewed: Basic Metabolic Panel:  Recent Labs Lab 12/16/14 0841 12/10/2014 0345 12/19/14 0250 12/19/14 2015 12/20/14 0255 12/21/14 0305 12/22/14 0310  NA 137 139 136  --  135 137 136  K 4.2 4.1 3.1*  --  3.7 3.6 3.9  CL 97 100 95*  --  99 100 97  CO2 26 24 26   --  23 21 19   GLUCOSE 109* 95 83  --  110* 108* 91  BUN 23 16 10   --  6 12 20   CREATININE 4.81* 4.31* 3.21*  --  2.70* 3.87* 4.88*  CALCIUM 8.5 8.6 8.5  --  8.1* 8.3* 8.5  MG  --   --   --  1.8 1.7 1.8  --   PHOS 2.9  --   --   --   --   --   --    Liver Function Tests:  Recent Labs Lab 12/16/14 0841 12/20/14 0255 12/21/14 0305  AST  --  50* 49*  ALT  --  17 19  ALKPHOS  --  96 90  BILITOT  --  9.1* 9.9*  PROT  --  6.2 6.8  ALBUMIN 1.5* 1.3* 1.3*   No results for input(s): LIPASE, AMYLASE in the last 168 hours. No results for input(s): AMMONIA in the last 168 hours. CBC:  Recent Labs Lab  12/16/2014 0356 12/19/14 0250 12/20/14 0255 12/21/14 0305 12/22/14 0310  WBC 25.2* 27.4* 28.1* 28.3* 25.9*  NEUTROABS  --   --  24.7* 24.9*  --   HGB 9.5* 9.1* 9.6* 9.0* 9.3*  HCT 28.1* 26.9* 28.7* 28.0* 30.0*  MCV 77.2* 76.2* 77.4* 80.0 83.3  PLT 284 268 263 260 258   Cardiac Enzymes: No results for input(s): CKTOTAL, CKMB, CKMBINDEX, TROPONINI in the last 168 hours. BNP (last 3 results) No results for input(s): BNP in the last 8760 hours.  ProBNP (last 3 results)  Recent Labs  08/15/14 0245  PROBNP >70000.0*    CBG:  Recent Labs Lab 12/21/14 1053 12/21/14 1549 12/21/14 2125 12/22/14 1254 12/22/14 1627  GLUCAP 99 104* 109* 99 94    No results found for this or any previous visit (from the past 240 hour(s)).   Studies:  Recent x-ray studies have been reviewed in  detail by the Attending Physician  Scheduled Meds:  Scheduled Meds: . allopurinol  100 mg Oral Daily  . antiseptic oral rinse  7 mL Mouth Rinse BID  . atorvastatin  20 mg Oral q1800  . darbepoetin (ARANESP) injection - DIALYSIS  100 mcg Intravenous Q Sat-HD  . famotidine  20 mg Oral Daily  . feeding supplement (NEPRO CARB STEADY)  237 mL Oral Q24H  . feeding supplement (PRO-STAT SUGAR FREE 64)  30 mL Oral BID  . FLUoxetine  20 mg Oral Daily  . insulin aspart  0-5 Units Subcutaneous QHS  . insulin aspart  0-9 Units Subcutaneous TID WC  . midodrine  10 mg Oral TID WC  . piperacillin-tazobactam (ZOSYN)  IV  2.25 g Intravenous Q8H  . traZODone  25 mg Oral QHS  . vancomycin  125 mg Oral QID  . vancomycin  1,000 mg Intravenous Q T,Th,Sa-HD  . Warfarin - Pharmacist Dosing Inpatient   Does not apply q1800    Time spent on care of this patient: 40 mins   Dennisha Mouser, Geraldo Docker , MD  [facility] Office  [phone] Pager - [phone]  On-Call/Text Page:      [email or web address]      password [email or web address]  If 7PM-7AM, please contact night-coverage [email or web address] Password [email or web address] 12/22/2014, 5:09 PM   LOS: 14  days   Care during the described time interval was provided by me .  I have reviewed this patient's available data, including medical history, events of note, physical examination, radiology studies and test results as part of my evaluation  Dia Crawford, MD [phone] Pager

## 2014-12-22 NOTE — Progress Notes (Signed)
Patient ID: [name] [name], male   DOB: Jun 28, 1951, 64 y.o.   MRN: [id number] Advanced Heart Failure Rounding Note   Subjective:    Mr Chabot is a 64 y/o M with CAD s/p CABG in 2008 with bioprosthetic mitral valve replacement at that time, ESRD on HD, prior GI bleed, HTN, HLD, PAF on oral amiodarone/Coumadin (s/p TEE/DCCV in 49/4496), chronic systolic dysfunction (last EF 15% in 07/2014), anemia, R AKA, chronic right loculated pleural effusion s/p chest tube 10/2014 .   Admitted to [facility] with necrotic L foot and A fib RVR. Placed on antibiotics.  He was started on cardizem that was later stopped and he was placed on amiodarone drip after he was evaluated by cardiology.  He transferred to [facility] on April 18 due to increased surgical risk.  BB stopped with hypotension.   He went to OR on 4/17 and had 3rd ray amputation. Over night he developed respiratory distress and placed on increased oxygen and now on BiPap.  CXR with Increased pulmonary edema and R and L pleural effusions --R>L. Hypotensive with SBP in 80s initially.    TEE-guided DCCV on 4/22.  Back in atrial fibrillation by 4/24.    Denies SOB. BP remains soft.  HR 70s currently, hard to tell on telemetry, but may have gone back into NSR.    Objective:   Weight Range:  Vital Signs:   Temp:  [97.4 F (36.3 C)-98.3 F (36.8 C)] 98.3 F (36.8 C) (04/26 0729) Pulse Rate:  [37-91] 71 (04/26 0910) Resp:  [14-29] 20 (04/26 0910) BP: (73-120)/(45-99) 73/51 mmHg (04/26 0910) SpO2:  [90 %-100 %] 94 % (04/26 0910) Weight:  [200 lb 9.9 oz (91 kg)-200 lb 13.4 oz (91.1 kg)] 200 lb 9.9 oz (91 kg) (04/26 0729) Last BM Date: 12/21/14  Weight change: Filed Weights   12/21/14 0300 12/22/14 0500 12/22/14 0729  Weight: 198 lb 3.1 oz (89.9 kg) 200 lb 13.4 oz (91.1 kg) 200 lb 9.9 oz (91 kg)    Intake/Output:   Intake/Output Summary (Last 24 hours) at 12/22/14 1013 Last data filed at 12/22/14 0600  Gross per 24 hour  Intake  403.7 ml  Output      1 ml   Net  402.7 ml     Physical Exam: General:  Chronically ill appearing. In bed.  HEENT: normal Neck: supple. JVP 8-9 cm.  Carotids 2+ bilat; no bruits. No lymphadenopathy or thryomegaly appreciated. Cor: PMI nondisplaced. Regular rate & rhythm. No rubs, gallops or murmurs. Lungs: Decreased breath sounds RLL LLL on 2 liters [place]  Abdomen: soft, nontender, nondistended. No hepatosplenomegaly. No bruits or masses. Good bowel sounds. Extremities: no cyanosis, clubbing, rash, edema R AKA LLE foot dressing in tact Neuro: drowsy but does arouse. On Bipap. Has R AKA. moves all 4 extremities w/o difficulty. Affect pleasant  Telemetry:  HR 70s, ?NSR  Labs: Basic Metabolic Panel:  Recent Labs Lab 12/16/14 0841 12/11/2014 0345 12/19/14 0250 12/19/14 2015 12/20/14 0255 12/21/14 0305 12/22/14 0310  NA 137 139 136  --  135 137 136  K 4.2 4.1 3.1*  --  3.7 3.6 3.9  CL 97 100 95*  --  99 100 97  CO2 26 24 26   --  23 21 19   GLUCOSE 109* 95 83  --  110* 108* 91  BUN 23 16 10   --  6 12 20   CREATININE 4.81* 4.31* 3.21*  --  2.70* 3.87* 4.88*  CALCIUM 8.5 8.6 8.5  --  8.1*  8.3* 8.5  MG  --   --   --  1.8 1.7 1.8  --   PHOS 2.9  --   --   --   --   --   --     Liver Function Tests:  Recent Labs Lab 12/16/14 0841 12/20/14 0255 12/21/14 0305  AST  --  50* 49*  ALT  --  17 19  ALKPHOS  --  96 90  BILITOT  --  9.1* 9.9*  PROT  --  6.2 6.8  ALBUMIN 1.5* 1.3* 1.3*   No results for input(s): LIPASE, AMYLASE in the last 168 hours. No results for input(s): AMMONIA in the last 168 hours.  CBC:  Recent Labs Lab 12/12/2014 0356 12/19/14 0250 12/20/14 0255 12/21/14 0305 12/22/14 0310  WBC 25.2* 27.4* 28.1* 28.3* 25.9*  NEUTROABS  --   --  24.7* 24.9*  --   HGB 9.5* 9.1* 9.6* 9.0* 9.3*  HCT 28.1* 26.9* 28.7* 28.0* 30.0*  MCV 77.2* 76.2* 77.4* 80.0 83.3  PLT 284 268 263 260 258    Cardiac Enzymes: No results for input(s): CKTOTAL, CKMB, CKMBINDEX, TROPONINI in the last 168  hours.  BNP: BNP (last 3 results) No results for input(s): BNP in the last 8760 hours.  ProBNP (last 3 results)  Recent Labs  08/15/14 0245  PROBNP >70000.0*      Other results:  Imaging: No results found.   Medications:     Scheduled Medications: . allopurinol  100 mg Oral Daily  . antiseptic oral rinse  7 mL Mouth Rinse BID  . atorvastatin  20 mg Oral q1800  . darbepoetin (ARANESP) injection - DIALYSIS  100 mcg Intravenous Q Sat-HD  . famotidine  20 mg Oral Daily  . FLUoxetine  20 mg Oral Daily  . insulin aspart  0-5 Units Subcutaneous QHS  . insulin aspart  0-9 Units Subcutaneous TID WC  . midodrine  10 mg Oral TID WC  . piperacillin-tazobactam (ZOSYN)  IV  2.25 g Intravenous Q8H  . traZODone  25 mg Oral QHS  . vancomycin  125 mg Oral QID  . vancomycin  1,000 mg Intravenous Q T,Th,Sa-HD  . Warfarin - Pharmacist Dosing Inpatient   Does not apply q1800    Infusions: . sodium chloride 10 mL/hr at 12/21/14 1057  . amiodarone 30 mg/hr (12/21/14 2259)    PRN Medications: sodium chloride, sodium chloride, acetaminophen, feeding supplement (NEPRO CARB STEADY), HYDROcodone-acetaminophen, lidocaine (PF), lidocaine-prilocaine, metoprolol, ondansetron **OR** ondansetron (ZOFRAN) IV, pentafluoroprop-tetrafluoroeth, zolpidem   Assessment/Plan    1. Acute Respiratory Failure: Pulmonary edema with acute/chronic systolic CHF.  Now on nasal cannula.  2. Hypotension: Has history of orthostatic hypotension in setting of ischemic cardiomyopathy and was on midodrine 10 mg twice a day at home. He has had trouble tolerating cardiac meds historically.  Suspect hypotension in the hospital may have been combination of cardiogenic and septic/vasodilatory shock.  BP remains low.  - No need for pressors at this point.  - Some degree of hypotension has been a Bach[place]ier-standing problem for Mr Bates.  Would continue midodrine at home dose.     3. Persistent atrial fibrillation with mild RVR:  TEE-DCCV 4/22, back in atrial fibrillation soon after.  Noted plan for left AKA this week.  He may be back in NSR today, hard to tell from telemetry. INR > 3.  Will get ECG today to confirm NSR.  Would continue IV amiodarone through surgery, likely convert to po after surgery.  4.  Acute on chronic systolic CHF:  Ischemic cardiomyopathy, he has tolerated minimal cardiac meds at home.  EF 25-30% on TEE 4/22. Does not have ICD (suspect poor candidate given infection risk).  He remains volume overloaded on exam but getting better with HD. Weight unchanged.  3. History of bioprosthetic mitral valve replacement for severe MR: Valve ok on TEE.  4. CAD: s/p CABG.  Stable, no chest pain.  On home atorvastatin 20 daily.  5. 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 On vanc and zosyn.  Plan for left AKA this admission.  6. End-stage renal disease on hemodialysis: As above, needs fluid removal via HD.  7. Pleural Effusions R> L 8. C difficile colitis: On po vancomycin.   Length of Stay: 14   CLEGG,AMY NP-C  12/22/2014, 10:13 AM  Advanced Heart Failure Team Pager [phone] (M-F; [place])  Please contact [facility] for night-coverage after hours (4p -7a ) and weekends on [email or web address]  Patient seen with NP, agree with the above note.  Stable SBP 90s-100s.  Getting HD today.  Will need left AKA this week.  HR in 70s and regular on telemetry.  May be back in NSR (hard to see Ps).  Will order ECG to confirm.   [name] 12/22/2014 11:49 AM

## 2014-12-22 NOTE — Progress Notes (Addendum)
RN paged stating pt was SOB and desatting into the 80s on 6L O2 per [place]. RN said she gave pt his pills and he vomited. He held vomit in his mouth for awhile and then became very anxious. RR high 20s to low 30s with increased WOB. Ordered NRB, CXR and ABG and NP to bedside. S: pt states he is SOB. Pain in stomach which is not new. O: Acute on chronically ill appearing elderly AAM. Afebrile. BP 110. HR 90s. He responds appropriately to questions. Follows commands. He is restless. RR with NRB has decreased to 23-26. O2 sat 95% on NRB. Effort has decreased somewhat as well since NP in room. Lungs are mostly clear, some rhonchi at the bases.  A/P: 1. Hypoxia-? etiology other than known severe cardiomyopathy. Did anxiety precipitate hypoxia or the other way around? Stat ABG. Stat CXR. He has already improved somewhat. MSO4 for WOB x 1. ? Aspiration although CXR most likely will not show anything yet. ? Edema, increased pleural effusion.  Will monitor closely and await results of tests. Could possibly need Bipap.  Clance Boll, NP [facility] Update: reviewed ABG. Pt was satting better on NRB but still using accessory muscles to breathe. Placed on Bipap. So far, pt tolerating bipap. Spoke to Dr. Corinna Lines, [facility], who agreed with Bipap after he reviewed the CXR.  Spoke with nephrology s/p HD today. Nephrology will f/up in am. Awaiting routine labs.  KJKG, NP

## 2014-12-22 NOTE — Procedures (Signed)
I was present at this dialysis session. I have reviewed the session itself and made appropriate changes.   Tolerating.  Pre Weight 91kg, will set UF goal at 3L given soft BPs.  AVF.  No Cathlyn Parsons  MD 12/22/2014, 8:49 AM

## 2014-12-22 NOTE — Progress Notes (Signed)
[place] for warfarin Indication: atrial fibrillation   Allergies  Allergen Reactions  . Bacitracin Other (See Comments)    unknown  . Norvasc [Amlodipine Besylate] Other (See Comments)    unknown    Patient Measurements: Height: 6\' 4"  (193 cm) Weight: 195 lb 12.3 oz (88.8 kg) IBW/kg (Calculated) : 86.8   Vital Signs: Temp: 97.9 F (36.6 C) (04/26 1140) Temp Source: Axillary (04/26 1140) BP: 80/51 mmHg (04/26 1140) Pulse Rate: 69 (04/26 1140)  Labs:  Recent Labs  12/19/14 2015  12/20/14 0255 12/21/14 0305 12/22/14 0310  HGB  --   < > 9.6* 9.0* 9.3*  HCT  --   --  28.7* 28.0* 30.0*  PLT  --   --  263 260 258  LABPROT  --   --  24.1* 32.5* 33.4*  INR  --   --  2.14* 3.14* 3.25*  HEPARINUNFRC 0.37  --  0.31 0.49  --   CREATININE  --   --  2.70* 3.87* 4.88*  < > = values in this interval not displayed.  Estimated Creatinine Clearance: 18.5 mL/min (by C-G formula based on Cr of 4.88).  Assessment: 58 yoM with ESED on HD on coumadin PTA for hx Afib. Was on a heparin drip, but stopped this morning with therapeutic/high INR.  Coumadin was held since 4/15-4/20 for Fem-pop surgery on 4/17. Pharmacy consulted to restart coumadin but no doses given initally as INR was elevated (INR was 4.43 on 12/14/14). Vitamin K 10 mg IV given 4/19 for surgery - INR down to 1.6.  INR now SUPRAtherapeutic at 3.25 after 5 days of warfarin 0.5mg . Dose was held yesterday. Hg 9.3, plts wnl, no bleeding noted.  Documented Home Dose: 0.5 mg daily except NO WARFARIN on Sunday and Wednesday  Goal of Therapy:  INR 2-3 Monitor platelets by anticoagulation protocol: Yes    Plan:  -hold warfarin tonight with elevated INR. Likely will still require a skipped day or two to keep in therapeutic range. Appears to be very sensitive. -Daily PT/INR and CBC -Mon s/sx bleed  [name] [name], PharmD, BCPS Clinical Pharmacist Pager: [phone] 12/22/2014 1:01 PM

## 2014-12-22 NOTE — Progress Notes (Signed)
INITIAL NUTRITION ASSESSMENT  DOCUMENTATION CODES Per approved criteria  -Not Applicable   INTERVENTION: -Nepro Shake po daily, each supplement provides 425 kcal and 19 grams protein -30 ml Prostat BID, each supplement provides 100 kcals and 15 grams protein  NUTRITION DIAGNOSIS: Increased nutrient needs related to wound healing as evidenced by estimated needs.   Goal: Pt will meet >90% of estimated needs  Monitor:  PO/supplement intake, labs, weight changes, I/O's  Reason for Assessment: Low [place]  64 y.o. male  Admitting Dx: <principal problem not specified>  64 year old male with history of CAD status post CABG 2008 with mitral valve replacement (bioprostetic), diabetes mellitus, atrial fibrillation on Coumadin and amiodarone status post cardioversion in 2015, ESRD, chronic systolic heart failure with an EF of 15% in 2015, right AKA, and a right chronic loculated pleural effusion who came to [facility] for left foot pain and was found to have cellulitis and gangrene with severe sepsis (lactic acid of 6). MRI showed deep ulceration but no evidence of osteo. ABIs showed a heavily diseased popliteal artery with occlusion in the mid segmentand very limited flow within the runoff vessels.  ASSESSMENT: Pt admitted with left lower extremity cellulitis with gangrene and ulceration / necrotic 3rd toe. S/p third ray amputation of lt toe on 12/16/2014.  Attempted to examine pt x 2, however, pt was either out of the room for a procedure or in with other staff members at time of visit.  Noted pt on antibiotics for C-diff.  Spoke with RN who reports that pt has had a very poor appetite over the past 3-4 days. Staff encourages pt to eat, but will often only consume small bites off trays. RN reports that pt does not currently have any supplements ordered, but agreed that pt wound benefit from nutritional supplements. Plan is for lt AKA tomorrow.  Wt hx reveals UBW between 195-205#.  Labs  reviewed. Creat: 4.88.   Height: Ht Readings from Last 1 Encounters:  12/10/2014 6\' 4"  (1.93 m)    Weight: Wt Readings from Last 1 Encounters:  12/22/14 195 lb 12.3 oz (88.8 kg)    Ideal Body Weight: 186#  % Ideal Body Weight: 105%  Wt Readings from Last 10 Encounters:  12/22/14 195 lb 12.3 oz (88.8 kg)  12/09/14 191 lb (86.637 kg)  11/19/14 176 lb 2.4 oz (79.9 kg)  10/19/14 196 lb (88.905 kg)  09/29/14 250 lb (113.399 kg)  09/17/14 205 lb (92.987 kg)  09/09/14 205 lb (92.987 kg)  08/31/14 205 lb (92.987 kg)  08/25/14 196 lb (88.905 kg)  08/20/14 196 lb 13.9 oz (89.3 kg)    Usual Body Weight: 200%  % Usual Body Weight: 98%  BMI:  Body mass index is 23.84 kg/(m^2).   Adjusted BMI (for amputations): 25.9. Overweight  Estimated Nutritional Needs: Kcal: 2100-2300 Protein: 125-135 grams Fluid: per MD  Skin: stage II pressure ulcer on sacrum, closed lt foot incision, DM ulcer on left foot, necrotic lt third toe  Diet Order: Diet renal/carb modified with fluid restriction Diet-HS Snack?: Nothing; Room service appropriate?: Yes; Fluid consistency:: Thin  EDUCATION NEEDS: -Education not appropriate at this time   Intake/Output Summary (Last 24 hours) at 12/22/14 1319 Last data filed at 12/22/14 1140  Gross per 24 hour  Intake  403.7 ml  Output   1864 ml  Net -1460.3 ml    Last BM: 12/21/14  Labs:   Recent [employer] 12/16/14 9485  12/19/14 2015 12/20/14 0255 12/21/14 0305 12/22/14 0310  NA 137  < >  --  135 137 136  K 4.2  < >  --  3.7 3.6 3.9  CL 97  < >  --  99 100 97  CO2 26  < >  --  23 21 19   BUN 23  < >  --  6 12 20   CREATININE 4.81*  < >  --  2.70* 3.87* 4.88*  CALCIUM 8.5  < >  --  8.1* 8.3* 8.5  MG  --   --  1.8 1.7 1.8  --   PHOS 2.9  --   --   --   --   --   GLUCOSE 109*  < >  --  110* 108* 91  < > = values in this interval not displayed.  CBG (last 3)   Recent Labs  12/21/14 1053 12/21/14 1549 12/21/14 2125  GLUCAP 99 104* 109*     Scheduled Meds: . allopurinol  100 mg Oral Daily  . antiseptic oral rinse  7 mL Mouth Rinse BID  . atorvastatin  20 mg Oral q1800  . darbepoetin (ARANESP) injection - DIALYSIS  100 mcg Intravenous Q Sat-HD  . famotidine  20 mg Oral Daily  . FLUoxetine  20 mg Oral Daily  . insulin aspart  0-5 Units Subcutaneous QHS  . insulin aspart  0-9 Units Subcutaneous TID WC  . midodrine  10 mg Oral TID WC  . piperacillin-tazobactam (ZOSYN)  IV  2.25 g Intravenous Q8H  . traZODone  25 mg Oral QHS  . vancomycin  125 mg Oral QID  . vancomycin  1,000 mg Intravenous Q T,Th,Sa-HD  . Warfarin - Pharmacist Dosing Inpatient   Does not apply q1800    Continuous Infusions: . sodium chloride 10 mL/hr at 12/21/14 1057  . amiodarone 30 mg/hr (12/22/14 1226)    Past Medical History  Diagnosis Date  . UGI bleed 02/12/11    On anticoagulation; EGD w/snare polypectomy-multiple polypoid lesions antrum(benign), Chronic active gastritis (NEGATIVE H pylori)  . Anemia, chronic disease   . Sepsis due to enterococcus 02/11/11  . Paroxysmal atrial fibrillation     On coumadin  . Coronary atherosclerosis of native coronary artery     a. Multivessel status post CABG 2008.  . Cardiomyopathy   . Type 2 diabetes mellitus   . Essential hypertension, benign   . Hyperlipemia   . Gout   . S/P patent foramen ovale closure   . Hyperbilirubinemia 08/10/2013  . SBO (small bowel obstruction) 01/2014  . 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 Clostridium difficile colitis 01/2014  . Diverticulosis   . Internal hemorrhoids   . ESRD on hemodialysis     Started 2008-2009. Dr Hinda Lenis on a TTS schedule - R forearm AVF for several years    . Orthostatic hypotension   . S/P mitral valve replacement with bioprosthetic valve     a. 2008 at time of CABG.  . Chronic systolic CHF (congestive heart failure)     a. Last EF 15% in 07/2014.  Marland Kitchen Loculated pleural effusion     a. chronic right loculated pleural effusion s/p chest tube  10/2014.  Marland Kitchen RBBB     a. Intermittent.     Past Surgical History  Procedure Laterality Date  . Mitral valve replacement  01/05/2007    Bioprosthetic 22mm Edwards precordial tissue (Dr. Servando Snare)  . Coronary artery bypass graft  01/05/2007    LIMA-LAD, SVG-OM, seq. SVG-PDA, PLA-RCA  . Colonoscopy  06/23/2011    Dr. Oneida Alar: multiple sessile and pedunculated polyps, moderate diverticulosis, internal hemorrhoids, +ADENOMATOUS POLYPS, consider genetic testing, surveillance in October 2015   . Esophagogastroduodenoscopy  02/12/2011    CHRONIC ACTIVE GASTRITIS, NO H. pylori  . Transthoracic echocardiogram  11/2011    EF 25% w/ mod dilatation of LV & mod LVH; LA severely dilated; mod-severe dilation of RV with mod-severe decrease in RV function; mild MR & TR  . Cardioversion  04/19/2007    Dr. Marella Chimes  . Transthoracic echocardiogram  02/14/2011    EF 40-45%, mod conc LVH; ventricular septum with motion showing abnormal function, dyssynergy, paradox; mild AS; mild-mod MR stenosis; LA severely dilated; aptrial septum bowed from L to R with increased LA pressure   . Amputation Right 07/14/2013    Procedure: AMPUTATION DIGIT;  Surgeon: [name], MD;  Location: [facility];  Service: General;  Laterality: Right;  . Amputation Right 08/08/2013    Procedure: AMPUTATION BELOW KNEE;  Surgeon: [name], MD;  Location: [facility];  Service: General;  Laterality: Right;  . Stump revision Right 09/19/2013    Procedure: STUMP REVISION;  Surgeon: Scherry Ran, MD;  Location: [facility];  Service: General;  Laterality: Right;  . Amputation Right 10/17/2013    Procedure: AMPUTATION ABOVE KNEE;  Surgeon: [name], MD;  Location: [facility];  Service: General;  Laterality: Right;  . Colonoscopy N/A 04/10/2014    Procedure: COLONOSCOPY;  Surgeon: [name], MD;  Location: [facility];  Service: Endoscopy;  Laterality: N/A;  830  . Tee without cardioversion N/A 08/19/2014    Procedure: TRANSESOPHAGEAL  ECHOCARDIOGRAM (TEE);  Surgeon: [name], MD;  Location: [facility] ENDOSCOPY;  Service: Cardiovascular;  Laterality: N/A;  . Cardioversion N/A 08/19/2014    Procedure: CARDIOVERSION;  Surgeon: [name], MD;  Location: [facility] ENDOSCOPY;  Service: Cardiovascular;  Laterality: N/A;  . Amputation Left 12/01/2014    Procedure: AMPUTATION LEFT THIRD TOE;  Surgeon: [name], MD;  Location: [place];  Service: Orthopedics;  Laterality: Left;  . Tee without cardioversion N/A 12/26/2014    Procedure: TRANSESOPHAGEAL ECHOCARDIOGRAM (TEE);  Surgeon: Larey Dresser, MD;  Location: [place];  Service: Cardiovascular;  Laterality: N/A;  . Cardioversion N/A 12/24/2014    Procedure: CARDIOVERSION;  Surgeon: Larey Dresser, MD;  Location: [place];  Service: Cardiovascular;  Laterality: N/A;    Ashten Sarnowski A. Jimmye Norman, RD, LDN, CDE Pager: [phone] After hours Pager: [phone]

## 2014-12-23 ENCOUNTER — Inpatient Hospital Stay (HOSPITAL_COMMUNITY): Payer: Medicare Other | Admitting: Anesthesiology

## 2014-12-23 ENCOUNTER — Inpatient Hospital Stay (HOSPITAL_COMMUNITY): Payer: Medicare Other

## 2014-12-23 ENCOUNTER — Encounter (HOSPITAL_COMMUNITY): Admission: EM | Disposition: E | Payer: Self-pay | Source: Home / Self Care | Attending: Internal Medicine

## 2014-12-23 DIAGNOSIS — I469 Cardiac arrest, cause unspecified: Secondary | ICD-10-CM

## 2014-12-23 DIAGNOSIS — J9601 Acute respiratory failure with hypoxia: Secondary | ICD-10-CM

## 2014-12-23 DIAGNOSIS — R6521 Severe sepsis with septic shock: Secondary | ICD-10-CM

## 2014-12-23 DIAGNOSIS — A419 Sepsis, unspecified organism: Secondary | ICD-10-CM

## 2014-12-23 LAB — TROPONIN I
Troponin I: 0.14 ng/mL — ABNORMAL HIGH (ref <0.031)
Troponin I: 0.08 ng/mL — ABNORMAL HIGH (ref <0.031)

## 2014-12-23 LAB — MAGNESIUM: Magnesium: 1.5 mg/dL (ref 1.5–2.5)

## 2014-12-23 LAB — POCT I-STAT 3, ART BLOOD GAS (G3+)
ACID-BASE DEFICIT: 7 mmol/L — AB (ref 0.0–2.0)
Bicarbonate: 18.7 mEq/L — ABNORMAL LOW (ref 20.0–24.0)
O2 SAT: 86 %
TCO2: 20 mmol/L (ref 0–100)
pCO2 arterial: 39.3 mmHg (ref 35.0–45.0)
pH, Arterial: 7.285 — ABNORMAL LOW (ref 7.350–7.450)
pO2, Arterial: 57 mmHg — ABNORMAL LOW (ref 80.0–100.0)

## 2014-12-23 LAB — POCT I-STAT, CHEM 8
BUN: 17 mg/dL (ref 6–23)
CALCIUM ION: 1.16 mmol/L (ref 1.13–1.30)
CHLORIDE: 100 mmol/L (ref 96–112)
Creatinine, Ser: 3.3 mg/dL — ABNORMAL HIGH (ref 0.50–1.35)
Glucose, Bld: 110 mg/dL — ABNORMAL HIGH (ref 70–99)
HEMATOCRIT: 29 % — AB (ref 39.0–52.0)
HEMOGLOBIN: 9.9 g/dL — AB (ref 13.0–17.0)
Potassium: 3.7 mmol/L (ref 3.5–5.1)
Sodium: 141 mmol/L (ref 135–145)
TCO2: 20 mmol/L (ref 0–100)

## 2014-12-23 LAB — BASIC METABOLIC PANEL
ANION GAP: 12 (ref 5–15)
BUN: 12 mg/dL (ref 6–23)
CO2: 17 mmol/L — ABNORMAL LOW (ref 19–32)
CREATININE: 2.72 mg/dL — AB (ref 0.50–1.35)
Calcium: 6.2 mg/dL — CL (ref 8.4–10.5)
Chloride: 107 mmol/L (ref 96–112)
GFR, EST AFRICAN AMERICAN: 27 mL/min — AB (ref >90)
GFR, EST NON AFRICAN AMERICAN: 23 mL/min — AB (ref >90)
Glucose, Bld: 63 mg/dL — ABNORMAL LOW (ref 70–99)
POTASSIUM: 6.8 mmol/L — AB (ref 3.5–5.1)
Sodium: 136 mmol/L (ref 135–145)

## 2014-12-23 LAB — CBC
HCT: 24.3 % — ABNORMAL LOW (ref 39.0–52.0)
HEMOGLOBIN: 7.1 g/dL — AB (ref 13.0–17.0)
MCH: 25.4 pg — ABNORMAL LOW (ref 26.0–34.0)
MCHC: 29.2 g/dL — ABNORMAL LOW (ref 30.0–36.0)
MCV: 86.8 fL (ref 78.0–100.0)
Platelets: 221 10*3/uL (ref 150–400)
RBC: 2.8 MIL/uL — ABNORMAL LOW (ref 4.22–5.81)
RDW: 25.1 % — AB (ref 11.5–15.5)
WBC: 39.9 10*3/uL — AB (ref 4.0–10.5)

## 2014-12-23 LAB — GLUCOSE, CAPILLARY
GLUCOSE-CAPILLARY: 69 mg/dL — AB (ref 70–99)
GLUCOSE-CAPILLARY: 90 mg/dL (ref 70–99)

## 2014-12-23 LAB — PROTIME-INR
INR: 4.15 — ABNORMAL HIGH (ref 0.00–1.49)
PROTHROMBIN TIME: 40.4 s — AB (ref 11.6–15.2)

## 2014-12-23 SURGERY — AMPUTATION, ABOVE KNEE
Anesthesia: General | Laterality: Left

## 2014-12-23 MED ORDER — MORPHINE BOLUS VIA INFUSION
5.0000 mg | INTRAVENOUS | Status: DC | PRN
  Filled 2014-12-23: qty 20

## 2014-12-23 MED ORDER — NOREPINEPHRINE BITARTRATE 1 MG/ML IV SOLN
2.0000 ug/min | INTRAVENOUS | Status: DC
  Filled 2014-12-23: qty 4

## 2014-12-23 MED ORDER — SODIUM CHLORIDE 0.9 % IV SOLN: Freq: Once | INTRAVENOUS | Status: DC

## 2014-12-23 MED ORDER — MORPHINE SULFATE 25 MG/ML IV SOLN
10.0000 mg/h | INTRAVENOUS | Status: DC
  Filled 2014-12-23: qty 10

## 2014-12-23 MED ORDER — MORPHINE SULFATE 2 MG/ML IJ SOLN
2.0000 mg | Freq: Once | INTRAMUSCULAR | Status: AC
  Administered 2014-12-23: 2 mg via INTRAVENOUS
  Filled 2014-12-23: qty 1

## 2014-12-23 MED ORDER — SODIUM BICARBONATE 8.4 % IV SOLN
INTRAVENOUS | Status: DC
  Administered 2014-12-23: 09:00:00 via INTRAVENOUS
  Filled 2014-12-23: qty 150

## 2014-12-23 MED ORDER — EPINEPHRINE HCL 1 MG/ML IJ SOLN
0.5000 ug/min | INTRAMUSCULAR | Status: DC
  Administered 2014-12-23: 2 ug/min via INTRAVENOUS
  Filled 2014-12-23: qty 4

## 2014-12-23 MED FILL — Medication: Qty: 1 | Status: AC

## 2014-12-23 SURGICAL SUPPLY — 42 items
BLADE SAW RECIP 87.9 MT (BLADE) ×3 IMPLANT
BNDG COHESIVE 6X5 TAN STRL LF (GAUZE/BANDAGES/DRESSINGS) ×6 IMPLANT
BNDG GAUZE ELAST 4 BULKY (GAUZE/BANDAGES/DRESSINGS) ×3 IMPLANT
COVER SURGICAL LIGHT HANDLE (MISCELLANEOUS) ×6 IMPLANT
CUFF TOURNIQUET SINGLE 34IN LL (TOURNIQUET CUFF) IMPLANT
CUFF TOURNIQUET SINGLE 44IN (TOURNIQUET CUFF) IMPLANT
DRAIN PENROSE 1/2X12 LTX STRL (WOUND CARE) IMPLANT
DRAPE EXTREMITY T 121X128X90 (DRAPE) ×3 IMPLANT
DRAPE PROXIMA HALF (DRAPES) ×3 IMPLANT
DRAPE U-SHAPE 47X51 STRL (DRAPES) ×6 IMPLANT
DRSG ADAPTIC 3X8 NADH LF (GAUZE/BANDAGES/DRESSINGS) ×3 IMPLANT
DRSG PAD ABDOMINAL 8X10 ST (GAUZE/BANDAGES/DRESSINGS) ×6 IMPLANT
DURAPREP 26ML APPLICATOR (WOUND CARE) ×3 IMPLANT
ELECT CAUTERY BLADE 6.4 (BLADE) IMPLANT
ELECT REM PT RETURN 9FT ADLT (ELECTROSURGICAL) ×3
ELECTRODE REM PT RTRN 9FT ADLT (ELECTROSURGICAL) ×1 IMPLANT
EVACUATOR 1/8 PVC DRAIN (DRAIN) IMPLANT
GAUZE SPONGE 4X4 12PLY STRL (GAUZE/BANDAGES/DRESSINGS) ×3 IMPLANT
GLOVE BIOGEL PI IND STRL 9 (GLOVE) ×1 IMPLANT
GLOVE BIOGEL PI INDICATOR 9 (GLOVE) ×2
GLOVE SURG ORTHO 9.0 STRL STRW (GLOVE) ×3 IMPLANT
GOWN STRL REUS W/ TWL XL LVL3 (GOWN DISPOSABLE) ×2 IMPLANT
GOWN STRL REUS W/TWL XL LVL3 (GOWN DISPOSABLE) ×4
KIT BASIN OR (CUSTOM PROCEDURE TRAY) ×3 IMPLANT
KIT ROOM TURNOVER OR (KITS) ×3 IMPLANT
MANIFOLD NEPTUNE II (INSTRUMENTS) ×3 IMPLANT
NS IRRIG 1000ML POUR BTL (IV SOLUTION) ×3 IMPLANT
PACK GENERAL/GYN (CUSTOM PROCEDURE TRAY) ×3 IMPLANT
PAD ARMBOARD 7.5X6 YLW CONV (MISCELLANEOUS) ×3 IMPLANT
STAPLER VISISTAT 35W (STAPLE) IMPLANT
STOCKINETTE IMPERVIOUS LG (DRAPES) IMPLANT
SUT ETHILON 2 0 PSLX (SUTURE) ×3 IMPLANT
SUT PDS AB 1 CT  36 (SUTURE)
SUT PDS AB 1 CT 36 (SUTURE) IMPLANT
SUT SILK 2 0 (SUTURE) ×2
SUT SILK 2-0 18XBRD TIE 12 (SUTURE) ×1 IMPLANT
SUT VIC AB 1 CTX 27 (SUTURE) ×3 IMPLANT
SWAB COLLECTION DEVICE MRSA (MISCELLANEOUS) IMPLANT
TOWEL OR 17X24 6PK STRL BLUE (TOWEL DISPOSABLE) ×3 IMPLANT
TOWEL OR 17X26 10 PK STRL BLUE (TOWEL DISPOSABLE) ×3 IMPLANT
TUBE ANAEROBIC SPECIMEN COL (MISCELLANEOUS) IMPLANT
WATER STERILE IRR 1000ML POUR (IV SOLUTION) ×3 IMPLANT

## 2014-12-27 NOTE — Progress Notes (Signed)
Called to pt room for decrease in O2 sats and alarming BiPap around 0530. Pt very confused and agitated, pulling at IV lines and BiPap mask. [place] notified, orders received. Meds given, see MAR. Pt resting after meds, vitals stable, will continue to monitor

## 2014-12-27 NOTE — Progress Notes (Signed)
Patient ID: [name] [name], male   DOB: 02/28/1951, 63 y.o.   MRN: [id number] Advanced Heart Failure Rounding Note   Subjective:    [name] [name] is a 64 y/o M with CAD s/p CABG in 2008 with bioprosthetic mitral valve replacement at that time, ESRD on HD, prior GI bleed, HTN, HLD, PAF on oral amiodarone/Coumadin (s/p TEE/DCCV in 51/7001), chronic systolic dysfunction (last EF 15% in 07/2014), anemia, R AKA, chronic right loculated pleural effusion s/p chest tube 10/2014 .   Admitted to [facility] with necrotic L foot and A fib RVR. Placed on antibiotics.  He was started on cardizem that was later stopped and he was placed on amiodarone drip after he was evaluated by cardiology.  He transferred to [facility] on April 18 due to increased surgical risk.  BB stopped with hypotension.   He went to OR on 4/17 and had 3rd ray amputation. Over night he developed respiratory distress and placed on increased oxygen and now on BiPap.  CXR with Increased pulmonary edema and R and L pleural effusions --R>L. Hypotensive with SBP in 80s initially.    TEE-guided DCCV on 4/22.  Back in atrial fibrillation by 4/24.    Last night patient had suspected aspiration event followed by progressive bradycardia and arrest.  He ended up being coded for about 30 minutes, had vfib that was cardioverted.  Eventually had ROSC.  Now hypotensive in 50s on epinephrine gtt.    Objective:   Weight Range:  Vital Signs:   Temp:  [97.8 F (36.6 C)-98.1 F (36.7 C)] 97.9 F (36.6 C) (04/26 2258) Pulse Rate:  [35-88] 76 (04/27 0525) Resp:  [17-28] 23 (04/27 0525) BP: (76-110)/(42-70) 86/42 mmHg (04/27 0525) SpO2:  [85 %-100 %] 95 % (04/27 0525) FiO2 (%):  [60 %] 60 % (04/26 2000) Weight:  [195 lb 12.3 oz (88.8 kg)] 195 lb 12.3 oz (88.8 kg) (04/26 1140) Last BM Date: 12/22/14  Weight change: Filed Weights   12/22/14 0500 12/22/14 0729 12/22/14 1140  Weight: 200 lb 13.4 oz (91.1 kg) 200 lb 9.9 oz (91 kg) 195 lb 12.3 oz (88.8 kg)     Intake/Output:   Intake/Output Summary (Last 24 hours) at 2014-12-27 0921 Last data filed at 12/22/14 2300  Gross per 24 hour  Intake  333.8 ml  Output   1863 ml  Net -1529.2 ml     Physical Exam: General:  Intubated/sedated HEENT: normal Neck: supple. Left IJ in place Cor: PMI lateral. Irregular rate & rhythm. No rubs, gallops or murmurs. Lungs: Decreased breath sounds RLL LLL   Abdomen: soft, nontender, nondistended. No hepatosplenomegaly. No bruits or masses. Good bowel sounds. Extremities: no cyanosis, clubbing, rash, edema. R AKA LLE foot dressing in tact Neuro: Intubated/sedated  Telemetry:  HR 70s, atrial fibrillation  Labs: Basic Metabolic Panel:  Recent Labs Lab 12/19/14 2015 12/20/14 0255 12/21/14 0305 12/22/14 0310 12/22/14 2135 12/27/2014 0800  NA  --  135 137 136 133* 136  K  --  3.7 3.6 3.9 3.9 6.8*  CL  --  99 100 97 95* 107  CO2  --  23 21 19 22  17*  GLUCOSE  --  110* 108* 91 201* 63*  BUN  --  6 12 20 10 12   CREATININE  --  2.70* 3.87* 4.88* 3.08* 2.72*  CALCIUM  --  8.1* 8.3* 8.5 8.0* 6.2*  MG 1.8 1.7 1.8  --   --  1.5    Liver Function Tests:  Recent Labs Lab  12/20/14 0255 12/21/14 0305  AST 50* 49*  ALT 17 19  ALKPHOS 96 90  BILITOT 9.1* 9.9*  PROT 6.2 6.8  ALBUMIN 1.3* 1.3*   No results for input(s): LIPASE, AMYLASE in the last 168 hours. No results for input(s): AMMONIA in the last 168 hours.  CBC:  Recent Labs Lab 12/19/14 0250 12/20/14 0255 12/21/14 0305 12/22/14 0310 2015/01/10 0800  WBC 27.4* 28.1* 28.3* 25.9* PENDING  NEUTROABS  --  24.7* 24.9*  --   --   HGB 9.1* 9.6* 9.0* 9.3* 7.1*  HCT 26.9* 28.7* 28.0* 30.0* 24.3*  MCV 76.2* 77.4* 80.0 83.3 86.8  PLT 268 263 260 258 PENDING    Cardiac Enzymes:  Recent Labs Lab 12/22/14 2135 01-10-15 0229 2015-01-10 0800  TROPONINI 0.09* 0.14* 0.08*    BNP: BNP (last 3 results) No results for input(s): BNP in the last 8760 hours.  ProBNP (last 3 results)  Recent  Labs  08/15/14 0245  PROBNP >70000.0*      Other results:  Imaging: Dg Chest Port 1 View  12/22/2014   CLINICAL DATA:  Respiratory distress  EXAM: PORTABLE CHEST - 1 VIEW  COMPARISON:  Chest CT November 09, 2014 and chest radiograph December 14, 2014  FINDINGS: There is generalized interstitial edema with patchy alveolar consolidation in both lower lobes. There is cardiomegaly with pulmonary venous hypertension. Opacity in the right perihilar region with right upper lobe volume loss remains. Patient is status post mitral valve replacement. Patient is also status post coronary artery bypass grafting.  IMPRESSION: Evidence of congestive heart failure. Question superimposed pneumonia, particular in the right lower lobe. Opacity in the right upper lobe and right perihilar regions remains. Prior CT showed loculated effusion in this area. The current appearance in the right upper hemithorax region appears grossly stable compared to prior CT examination.   Electronically Signed   By: Lowella Grip III M.D.   On: 12/22/2014 20:13     Medications:     Scheduled Medications: . sodium chloride   Intravenous Once  . allopurinol  100 mg Oral Daily  . antiseptic oral rinse  7 mL Mouth Rinse BID  . darbepoetin (ARANESP) injection - DIALYSIS  100 mcg Intravenous Q Sat-HD  . famotidine  20 mg Oral Daily  . feeding supplement (NEPRO CARB STEADY)  237 mL Oral Q24H  . feeding supplement (PRO-STAT SUGAR FREE 64)  30 mL Oral BID  . FLUoxetine  20 mg Oral Daily  . midodrine  10 mg Oral TID WC  . piperacillin-tazobactam (ZOSYN)  IV  2.25 g Intravenous Q8H  . traZODone  25 mg Oral QHS  . vancomycin  125 mg Oral QID  . vancomycin  1,000 mg Intravenous Q T,Th,Sa-HD    Infusions: . sodium chloride 10 mL/hr at 12/21/14 1057  . epinephrine    .  sodium bicarbonate  infusion 1000 mL      PRN Medications: acetaminophen, lidocaine (PF), lidocaine-prilocaine, metoprolol, ondansetron **OR** ondansetron (ZOFRAN)  IV, pentafluoroprop-tetrafluoroeth   Assessment/Plan    1. Acute Respiratory Failure: Intubated s/p cardiac arrest.  Suspect aspiration event triggered this.  Per CCM.   2. Hypotension: Cardiogenic shock post-arrest, EF ~ 5% on bedside echo this morning post-arrest.  On epinephrine gtt, adding norepinephrine gtt.     3. Persistent atrial fibrillation with mild RVR: TEE-DCCV 4/22, back in atrial fibrillation soon after.  This morning, in atrial fibrillation with rate 70s, amiodarone held because of bradycardia with arrest.  4. Acute on chronic systolic  CHF:  Ischemic cardiomyopathy, he has tolerated minimal cardiac meds at home.  EF 25-30% on TEE 4/22, now EF 5% on bedside echo.  3. History of bioprosthetic mitral valve replacement for severe [name]: Valve ok on TEE.  4. CAD: s/p CABG.   5. 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 On vanc and zosyn.  Planed for left AKA this admission, now on hold.  6. End-stage renal disease on hemodialysis: If we continue aggressive tx, will need CVVH.  7. Pleural Effusions R> L 8. C difficile colitis: On po vancomycin. 9. Very poor prognosis, would be appropriate to move towards comfort care.    Length of Stay: [place] 21-Jan-2015, 9:21 AM

## 2014-12-27 NOTE — Consult Note (Signed)
PULMONARY / CRITICAL CARE MEDICINE   Name: [name] [name] MRN: [id number] DOB: 1951-01-25    ADMISSION DATE:  12/06/2014 CONSULTATION DATE:  4/27  REFERRING MD :  Thereasa Solo   CHIEF COMPLAINT:  Cardiopulmonary arrest   INITIAL PRESENTATION:  64 year old male with history of CAD status post CABG 2008 with mitral valve replacement (bioprostetic), diabetes mellitus, atrial fibrillation on Coumadin and amiodarone status post cardioversion in 2015, ESRD, chronic systolic heart failure with an EF of 15% in 2015, right AKA, and a right chronic loculated pleural effusion who came to [facility] on 4/12 for left foot pain and was found to have cellulitis and gangrene with severe sepsis (lactic acid of 6). MRI showed deep ulceration but no evidence of osteo. ABIs showed a heavily diseased popliteal artery with occlusion in the mid segmentand very limited flow within the runoff vessels. On 04/14 his RVR became hard to control andhe was started on IV amiodarone and Cardiology was consulted. 04/14 after after dialysis he became hypotensive and had to be given multiple boluses of normal saline. Cardiology and IM deemed him high risk of surgery and he was therefore transferred to [facility]. He continued empiric antibiotics, underwent limited I&D on 4/17 and was found to have overwhelming infection involving the left foot and a necrotic gangrenous left third toe. He then underwent cardioversion (successfully on 4/22). Was to have amputation on 4/27. On 4/26 pt vomited; subsequently noted to have increased RR and hypoxia. CXR showed new RUL collapse. Placed on BIPAP. Had progressive respiratory failure, developed progressive hypotension, persistent hypoxia and eventually progressed to bradycardic then VF arrest. He underwent approx 30 min ACLS before ROSC. On arrival to ICU went into PEA again. Received another 5 minutes ACLS w/ ROSC and then was placed on epinephrine and bicarb gtt. F/U chemistry was obtained. Was  negative for hyperkalemia. ABG showed mild respiratory and metabolic acidosis. His blood pressure remained in 50s in spite of therapy. We spoke to the family and discussed all that had transpired and the almost certainty that he would not survive. Given the events and what appeared to be almost certain pending recurrenty cardiac arrest the family opted for DNR status w/ no further escalation. The family was provided emotional support from that point on.  STUDIES:    SIGNIFICANT EVENTS: 4/14: AF w/ RVR 4/17: s/p I&D left foot w/ toe amputation 4/22: TEE: EF 25-30%; diffuse hypokinesis appears worse in the septum. The RV was mildly dilated with moderately decreased systolic function. There was a bioprosthetic mitral valve, 1 leaflet was restricted by calcification. There was trivial MR, probably no significant mitral stenosis 4/22: cardioversion -->NSR 4/27 Cardiac arrest (brady/VF)-->down 30 minutes to ROSC; then another 5 minutes on arrival to ICU.    HISTORY OF PRESENT ILLNESS:   See above   PAST MEDICAL HISTORY :   has a past medical history of UGI bleed (02/12/11); Anemia, chronic disease; Sepsis due to enterococcus (02/11/11); Paroxysmal atrial fibrillation; Coronary atherosclerosis of native coronary artery; Cardiomyopathy; Type 2 diabetes mellitus; Essential hypertension, benign; Hyperlipemia; Gout; S/P patent foramen ovale closure; Hyperbilirubinemia (08/10/2013); SBO (small bowel obstruction) (01/2014); Pneumatosis coli (01/2014); Clostridium difficile colitis (01/2014); Diverticulosis; Internal hemorrhoids; ESRD on hemodialysis; Orthostatic hypotension; S/P mitral valve replacement with bioprosthetic valve; Chronic systolic CHF (congestive heart failure); Loculated pleural effusion; and RBBB.  has past surgical history that includes Mitral valve replacement (01/05/2007); Coronary artery bypass graft (01/05/2007); Colonoscopy (06/23/2011); Esophagogastroduodenoscopy (02/12/2011); transthoracic  echocardiogram (11/2011); Cardioversion (04/19/2007); transthoracic echocardiogram (02/14/2011); Amputation (Right,  07/14/2013); Amputation (Right, 08/08/2013); Stump revision (Right, 09/19/2013); Amputation (Right, 10/17/2013); Colonoscopy (N/A, 04/10/2014); TEE without cardioversion (N/A, 08/19/2014); Cardioversion (N/A, 08/19/2014); Amputation (Left, 12/26/2014); TEE without cardioversion (N/A, 12/22/2014); and Cardioversion (N/A, 12/10/2014). Prior to Admission medications   Medication Sig Start Date End Date Taking? Authorizing Provider  albuterol (PROVENTIL) (2.5 MG/3ML) 0.083% nebulizer solution Take 3 mLs (2.5 mg total) by nebulization every 2 (two) hours as needed for wheezing. 02/21/14  Yes Kinnie Feil, MD  allopurinol (ZYLOPRIM) 100 MG tablet Take 100 mg by mouth daily.  05/15/11  Yes Historical Provider, MD  amiodarone (PACERONE) 200 MG tablet Take 1 tablet (200 mg total) by mouth daily. 08/31/14  Yes Satira Sark, MD  B Complex-C-Zn-Folic Acid (DIALYVITE/ZINC PO) Take by mouth 3 (three) times a week. Patient has dialysis on Tuesdays, Thursdays, and Saturdays   Yes Historical Provider, MD  FLUoxetine (PROZAC) 20 MG capsule Take 20 mg by mouth daily. 09/21/14  Yes Historical Provider, MD  HYDROcodone-acetaminophen (NORCO/VICODIN) 5-325 MG per tablet Take 1 tablet by mouth every 6 (six) hours as needed for moderate pain. 08/06/14  Yes Carole Civil, MD  insulin glargine (LANTUS) 100 UNIT/ML injection Inject 10 Units into the skin at bedtime.   Yes Historical Provider, MD  metoprolol tartrate (LOPRESSOR) 25 MG tablet Take 0.5 tablets (12.5 mg total) by mouth 2 (two) times daily. 11/19/14  Yes Maryann Mikhail, DO  midodrine (PROAMATINE) 10 MG tablet Take 1 tablet (10 mg total) by mouth 2 (two) times daily with a meal. 08/20/14  Yes Silver Huguenin Elgergawy, MD  pantoprazole (PROTONIX) 40 MG tablet Take 40 mg by mouth 2 (two) times daily.  05/25/11  Yes Historical Provider, MD  RENVELA 800 MG tablet  Take 800 mg by mouth 3 (three) times daily with meals.  05/03/11  Yes Historical Provider, MD  SENSIPAR 30 MG tablet Take 30 mg by mouth daily. 01/22/14  Yes Historical Provider, MD  traMADol (ULTRAM) 50 MG tablet Take by mouth every 8 (eight) hours as needed for moderate pain.   Yes Historical Provider, MD  traZODone (DESYREL) 50 MG tablet Take 1 tablet (50 mg total) by mouth at bedtime. 08/13/13  Yes Nita Sells, MD  warfarin (COUMADIN) 1 MG tablet Take 0.5 mg by mouth See admin instructions. Take 1/2 tablet daily (0.5mg ) daily except nothing on Sunday and Wednesday.   Yes Historical Provider, MD  Nutritional Supplements (FEEDING SUPPLEMENT, NEPRO CARB STEADY,) LIQD Take 237 mLs by mouth as needed (missed meal during dialysis.). 11/19/14   Maryann Mikhail, DO   Allergies  Allergen Reactions  . Bacitracin Other (See Comments)    unknown  . Norvasc [Amlodipine Besylate] Other (See Comments)    unknown    FAMILY HISTORY:  indicated that his mother is deceased. He indicated that his father is deceased. He indicated that both of his sisters are alive. He indicated that only one of his two brothers is alive.  SOCIAL HISTORY:  reports that he quit smoking about 8 years ago. His smoking use included Cigarettes. He started smoking about 45 years ago. He has a 8 pack-year smoking history. He has never used smokeless tobacco. He reports that he does not drink alcohol or use illicit drugs.  REVIEW OF SYSTEMS:  Unable   SUBJECTIVE: Unresponsive on vent   VITAL SIGNS: Temp:  [97.8 F (36.6 C)-98.1 F (36.7 C)] 97.9 F (36.6 C) (04/26 2258) Pulse Rate:  [35-88] 76 (04/27 0525) Resp:  [17-28] 23 (04/27 0525) BP: (73-110)/(42-70) 86/42  mmHg (04/27 0525) SpO2:  [85 %-100 %] 95 % (04/27 0525) FiO2 (%):  [60 %] 60 % (04/26 2000) Weight:  [88.8 kg (195 lb 12.3 oz)] 88.8 kg (195 lb 12.3 oz) (04/26 1140) HEMODYNAMICS:   VENTILATOR SETTINGS: Vent Mode:  [-]  FiO2 (%):  [60 %] 60 % INTAKE /  OUTPUT:  Intake/Output Summary (Last 24 hours) at 2015-01-01 0854 Last data filed at 12/22/14 2300  Gross per 24 hour  Intake  350.5 ml  Output   1863 ml  Net -1512.5 ml    PHYSICAL EXAMINATION: General:  Chronically ill appearing AAM, remains unresponsive after code.  Neuro:  GCS 3 HEENT:  Orally intubated  Cardiovascular:  Rrr,  Lungs:  Scattered rhonchi  Abdomen:  Soft, non-tender  Musculoskeletal:  RLE amp; LLE foot wrapped, cool and mottled  Skin:  Dry   LABS:  CBC  Recent Labs Lab 12/21/14 0305 12/22/14 0310 01-01-2015 0800  WBC 28.3* 25.9* PENDING  HGB 9.0* 9.3* 7.1*  HCT 28.0* 30.0* 24.3*  PLT 260 258 PENDING   Coag's  Recent Labs Lab 12/11/2014 0356 12/19/14 0250  12/21/14 0305 12/22/14 0310 Jan 01, 2015 0229  APTT 77* >200*  --   --   --   --   INR 1.60* 1.83*  < > 3.14* 3.25* 4.15*  < > = values in this interval not displayed. BMET  Recent Labs Lab 12/21/14 0305 12/22/14 0310 12/22/14 2135  NA 137 136 133*  K 3.6 3.9 3.9  CL 100 97 95*  CO2 21 19 22   BUN 12 20 10   CREATININE 3.87* 4.88* 3.08*  GLUCOSE 108* 91 201*   Electrolytes  Recent Labs Lab 12/19/14 2015 12/20/14 0255 12/21/14 0305 12/22/14 0310 12/22/14 2135  CALCIUM  --  8.1* 8.3* 8.5 8.0*  MG 1.8 1.7 1.8  --   --    Sepsis Markers No results for input(s): LATICACIDVEN, PROCALCITON, O2SATVEN in the last 168 hours. ABG  Recent Labs Lab 12/22/14 1950  PHART 7.347*  PCO2ART 45.3*  PO2ART 89.9   Liver Enzymes  Recent Labs Lab 12/20/14 0255 12/21/14 0305  AST 50* 49*  ALT 17 19  ALKPHOS 96 90  BILITOT 9.1* 9.9*  ALBUMIN 1.3* 1.3*   Cardiac Enzymes  Recent Labs Lab 12/22/14 2135 01/01/2015 0229  TROPONINI 0.09* 0.14*   Glucose  Recent Labs Lab 12/21/14 1549 12/21/14 2125 12/22/14 1254 12/22/14 1627 12/22/14 2121 01-01-15 0749  GLUCAP 104* 109* 99 94 123* 69*    Imaging Dg Chest Port 1 View  12/22/2014   CLINICAL DATA:  Respiratory distress  EXAM:  PORTABLE CHEST - 1 VIEW  COMPARISON:  Chest CT November 09, 2014 and chest radiograph December 14, 2014  FINDINGS: There is generalized interstitial edema with patchy alveolar consolidation in both lower lobes. There is cardiomegaly with pulmonary venous hypertension. Opacity in the right perihilar region with right upper lobe volume loss remains. Patient is status post mitral valve replacement. Patient is also status post coronary artery bypass grafting.  IMPRESSION: Evidence of congestive heart failure. Question superimposed pneumonia, particular in the right lower lobe. Opacity in the right upper lobe and right perihilar regions remains. Prior CT showed loculated effusion in this area. The current appearance in the right upper hemithorax region appears grossly stable compared to prior CT examination.   Electronically Signed   By: Lowella Grip III M.D.   On: 12/22/2014 20:13     ASSESSMENT / PLAN:  PULMONARY OETT 4/17>>> A: Acute hypoxic respiratory  failure  Respiratory arrest.  RUL collapse/ATX (pre-intubation) R>L pulmonary infiltrates c/w HCAP  P:   Full vent support w/ high Ve initially to compensate for acidosis PAD protocol Repeat post-intubation film; may need bronch if RUL still down F/u abg Sputum culture   CARDIOVASCULAR CVL  Left IJ 4/27>>> Left Fem ALine 4/27>>> A:  PEA/VF arrest  ES systolic CM w/ decompensated systolic HF s/p cardiac arrest (known EF 15%) Cardiogenic/septic shock H/o AF/flutter CAD Chronic hypotension (typically on midodrine) Prior MVR (bioprosthetic)  P:  Epi gtt for SBP >90 CVP monitoring goal 8-12 Holding amiodarone given bradycardia  Repeat ABG and Chemistry given initial rhythm was bradycardia   RENAL A:   ESRD Dry weight: 80kg P:   Stat ABG Stat repeat chemistry  I&O Have d/w Nephrology; suspect he will need CRRT.   GASTROINTESTINAL A:   C-diff colitis P:   NPO -->tubefeeds to start  H2B given [place]  HEMATOLOGIC A:   Anemia  of critical illness w/ ABL anemia  Coagulopathy  hgb-->drifted from 2 gm drop  P:  Repeat stat CBC Hold anticoagulation  Will give FFP   INFECTIOUS A:   Sepsis in setting of necrotic LE  C diff Colitis   HCAP vs aspiration PNA P:   Sputum 4/27>> vanc oral 4/27>>> Zosyn 4/23>>>> vanc 4/18>>>  ENDOCRINE A:   DM w/ labile glycemic control   P:   CBG q 4 Sensitive ssi   NEUROLOGIC A:   Acute encephalopathy: at risk for HIE given prolonged cardiac arrest.  P:   RASS goal: -1 PAD protocol; will start w/ PRN fentanyl first.    FAMILY  - Updates: by medicine post arrest on 4/27  - Inter-disciplinary family meet or Palliative Care meeting due by:  4/7    TODAY'S SUMMARY: prolonged cardiac arrest in pt w/ septic/necrotic LLE; [place], and aspiration PNA. Refractory septic/cardiogenic shock. Not responding to pressors after arrest. Family opted to w/d aggressive rx  Erick Colace ACNP-BC [place] Pager # [phone] OR # [phone] if no answer  2015-01-19, 8:54 AM  Patient is in refractory septic shock.  Multiple episodes of cardiac arrest.  Maxed on high dose epi drip and SBP remains in the 50's.  Discussed with cardiology and patient has no chance to survive this episode from a hemodynamic standpoint.  I spoke with wife and son extensively.  Based on discussion, will make patient a full DNR and will stop epi once family is ready and expect patient to expire with that alone.  I see no reason to extubate at this point.  The patient is critically ill with multiple organ systems failure and requires high complexity decision making for assessment and support, frequent evaluation and titration of therapies, application of advanced monitoring technologies and extensive interpretation of multiple databases.   Critical Care Time devoted to patient care services described in this note is  45  Minutes. This time reflects time of care of this signee Dr Jennet Maduro.  This critical care time does not reflect procedure time, or teaching time or supervisory time of PA/NP/Med student/Med Resident etc but could involve care discussion time.  Rush Farmer, M.D. [facility] Pulmonary/Critical Care Medicine. Pager: [phone]. After hours pager: [phone].

## 2014-12-27 NOTE — Progress Notes (Signed)
RT note- Ventilator discontinued/turned off by RN.

## 2014-12-27 NOTE — H&P (View-Only) (Signed)
Patient ID: [name] [name], male   DOB: May 10, 1951, 64 y.o.   MRN: [id number] Patient has badly diseased popliteal artery with poor runoff on the left. Patient will require an above-the-knee amputation. I discussed the patient's care with his wife last night. She agrees to proceeding with surgery. We will plan for surgery either Tuesday or Wednesday.

## 2014-12-27 NOTE — Progress Notes (Signed)
UR Completed.  [phone]  

## 2014-12-27 NOTE — Progress Notes (Signed)
  [place] TEAM 1 - Stepdown/ICU TEAM  I was called by the bedside nurse while enroute to the hospital and was informed that the patient was being coded by the code team.  I presented to the bedside as soon as possible and received a briefing by the code team as well as bedside nurse.  At this time the patient had regained a pulse and blood pressure after prolonged ACLS support to include multiple epinephrine doses and multiple shocks for unstable ventricular arrhythmias.  The patient had been intubated by respiratory therapy and the code run by the designated code team.  I accompanied the patient to the medical intensive care unit and debriefed the PCCM team at the bedside, who then assumed care of the patient.  I also called and spoke with the attending physician on the CHF team to alert him to the events of the morning.  I met with the patient's family on 2 occasions in the ICU conference room and explained to them the events of the morning and my fear that he was not likely to survive despite our ongoing efforts.  Cherene Altes, MD [facility] For Consults/Admissions - Flow Manager [phone] Office  [phone]  Contact MD directly via text page:      [email or web address]      password [facility]

## 2014-12-27 NOTE — Progress Notes (Signed)
Chaplain provided emotional and spiritual support to family at bedside as patient passed and prayed with family.  Family still grieving appropriately at bedside.  Chaplain will continue to follow up.    14-Jan-2015 1000  Clinical Encounter Type  Visited With Patient and family together;Health care provider  Visit Type Follow-up;Spiritual support;Social support;Critical Care;Death  Spiritual Encounters  Spiritual Needs Emotional;Grief support  Stress Factors  Family Stress Factors Family relationships;Loss   Geralyn Flash 01/14/15 10:19 AM

## 2014-12-27 NOTE — Procedures (Signed)
Central Venous Catheter Insertion Procedure Note ESTEFANO VICTORY [id number] March 05, 1951  Procedure: Insertion of Central Venous Catheter Indications: Assessment of intravascular volume, Drug and/or fluid administration and Frequent blood sampling  Procedure Details Consent: Unable to obtain consent because of emergent medical necessity. Time Out: Verified patient identification, verified procedure, site/side was marked, verified correct patient position, special equipment/implants available, medications/allergies/relevent history reviewed, required imaging and test results available.  Performed  Maximum sterile technique was used including antiseptics, cap, gloves, gown, hand hygiene, mask and sheet. Skin prep: Chlorhexidine; local anesthetic administered A antimicrobial bonded/coated triple lumen catheter was placed in the left internal jugular vein using the Seldinger technique. Ultrasound guidance used.Yes.   Catheter placed to 20 cm. Blood aspirated via all 3 ports and then flushed x 3. Line sutured x 2 and dressing applied.  Evaluation Blood flow good Complications: No apparent complications Patient did tolerate procedure well. Chest X-ray ordered to verify placement.  CXR: pending.  Richardson Landry Minor ACNP Maryanna Shape PCCM Pager [phone] till 3 pm If no answer page [phone] Jan 07, 2015, 9:04 AM  U/S used in placement.  I was present and supervised the entire procedure.  Rush Farmer, M.D. [facility] Pulmonary/Critical Care Medicine. Pager: [phone]. After hours pager: [phone].

## 2014-12-27 NOTE — Procedures (Signed)
Arterial Catheter Insertion Procedure Note BERNHARDT RIEMENSCHNEIDER [id number] 06-Jun-1951  Procedure: Insertion of Arterial Catheter  Indications: Blood pressure monitoring and Frequent blood sampling  Procedure Details Consent: Unable to obtain consent because of emergent medical necessity. Time Out: Verified patient identification, verified procedure, site/side was marked, verified correct patient position, special equipment/implants available, medications/allergies/relevent history reviewed, required imaging and test results available.  Performed  Maximum sterile technique was used including antiseptics, cap, gloves, gown, hand hygiene, mask and sheet. Skin prep: Chlorhexidine; local anesthetic administered 20 gauge catheter was inserted into left femoral artery using the Seldinger technique.  Evaluation Blood flow good; BP tracing good. Complications: No apparent complications.   Richardson Landry Minor ACNP [facility] PCCM Pager [phone] till 3 pm If no answer page [phone] Jan 06, 2015, 9:06 AM  U/S used in placement.  I was present and supervised the entire procedure.  Rush Farmer, M.D. [facility] Pulmonary/Critical Care Medicine. Pager: [phone]. After hours pager: [phone].

## 2014-12-27 NOTE — Progress Notes (Signed)
Called at 0730 by staff that patient had dropped his BP and HR was dropping but still had pulse and resps present although unresponsive - asked if they needed the code team - they reassessed and called the code blue as I was in route to see patient.  On my arrival CPR being performed - ACLS protocols initiated - code team arrived - see Code Blue sheet for details.

## 2014-12-27 NOTE — Progress Notes (Signed)
Respiratory therapy note- Code Blue called on patient approximately 0730, upon arrival, patient was being ventilated with ambu bag, 100% fio2. I took over for airway maintenance for nursing. Anesthesia at bedside as well. #7.5 ETT was placed and secured with  confirmation at 24   BBS equal with Rhonchi throughout. Patient was suctioned for copious thick green/yellow secretions. CPR was continued and end tidal co2 placed for monitoring. Patient was stabilized and transported to 84M-10. Report given to unit therapist.

## 2014-12-27 NOTE — Interval H&P Note (Signed)
History and Physical Interval Note:  2015-01-01 6:35 AM  [name] [name]  has presented today for surgery, with the diagnosis of Gangrene Left Foot  The various methods of treatment have been discussed with the patient and family. After consideration of risks, benefits and other options for treatment, the patient has consented to  Procedure(s): AMPUTATION ABOVE KNEE (Left) as a surgical intervention .  The patient's history has been reviewed, patient examined, no change in status, stable for surgery.  I have reviewed the patient's chart and labs.  Questions were answered to the patient's satisfaction.     DUDA,MARCUS V

## 2014-12-27 NOTE — Anesthesia Preprocedure Evaluation (Deleted)
Anesthesia Evaluation  Patient identified by MRN, date of birth, ID band Patient awake    Reviewed: Allergy & Precautions, NPO status , Patient's Chart, lab work & pertinent test results  Airway Mallampati: II  TM Distance: >3 FB Neck ROM: Full    Dental no notable dental hx.    Pulmonary former smoker,  Evidence of congestive heart failure. Question superimposed pneumonia, particular in the right lower lobe. Opacity in the right upper lobe and right perihilar regions remains. Prior CT showed loculated effusion in this area. The current appearance in the right upper hemithorax region appears grossly stable compared to prior CT examination. breath sounds clear to auscultation  Pulmonary exam normal       Cardiovascular hypertension, Pt. on home beta blockers + CAD, + CABG and +CHF + dysrhythmias Atrial Fibrillation + Valvular Problems/Murmurs MR Rhythm:Irregular Rate:Tachycardia  Findings: Please see echo section for full report. Normal LV with mild LV hypertrophy. EF 25-30%, diffuse hypokinesis appears worse in the septum. The RV was mildly dilated with moderately decreased systolic function. There was a bioprosthetic mitral valve, 1 leaflet was restricted by calcification. There was trivial MR, probably no significant mitral stenosis. Trileaflet aortic valve with no stenosis or regurgitation. Trivial TR. Moderate LAE with smoke but no thrombus in LA. The LA appendage appears to have been surgically ligated. Mild RAE. No ASD/PFO noted. Normal caliber thoracic aorta with mild to moderate plaque in descending thoracic aorta.     Neuro/Psych negative neurological ROS  negative psych ROS   GI/Hepatic negative GI ROS, Neg liver ROS,   Endo/Other  diabetes, Insulin Dependent  Renal/GU negative Renal ROS  negative genitourinary   Musculoskeletal negative musculoskeletal ROS (+)   Abdominal   Peds negative pediatric  ROS (+)  Hematology  (+) anemia ,   Anesthesia Other Findings   Reproductive/Obstetrics negative OB ROS                            Anesthesia Physical Anesthesia Plan  ASA: IV  Anesthesia Plan: General   Post-op Pain Management:    Induction: Intravenous  Airway Management Planned: Oral ETT  Additional Equipment:   Intra-op Plan:   Post-operative Plan: Possible Post-op intubation/ventilation  Informed Consent: I have reviewed the patients History and Physical, chart, labs and discussed the procedure including the risks, benefits and alternatives for the proposed anesthesia with the patient or authorized representative who has indicated his/her understanding and acceptance.   Dental advisory given  Plan Discussed with: CRNA and Surgeon  Anesthesia Plan Comments: (Patient anticoagulated)       Anesthesia Quick Evaluation

## 2014-12-27 NOTE — Progress Notes (Addendum)
Upon hearing night events of PT status, paged attending this am, obtaining # for surgeon, then looked at monitor and saw heart rate drop to 30s-40s, O2 sats 80s on BiPAP. Paged RR and attending again, called code; code team arrived. Pt regained rhythm post resuscitation efforts and was TX to 18M-10. Family arrived and was escorted to ICU.

## 2014-12-27 NOTE — Progress Notes (Addendum)
Family all at bedside. Have spoken w/ Dr Nelda Marseille. They have requested to d/c vasoactive gtts and treat comfort only. For now will keep intubated, doubt he can sustain off pressor and bicarb support.    Erick Colace ACNP-BC [place] Pager # [phone] OR # [phone] if no answer

## 2014-12-27 NOTE — Code Documentation (Signed)
  Patient Name: [name] [name]   MRN: [id number]   Date of Birth/ Sex: 1951-07-01 , male      Admission Date: 11/27/2014  Attending Provider: Cherene Altes, MD  Primary Diagnosis: Left foot gangrene   Indication: Pt was in his usual state of health until this AM, when he was noted to be unresponsive. Code blue was subsequently called. At the time of arrival on scene, ACLS protocol was underway.   Technical Description:  - CPR performance duration:  25  minutes  - Was defibrillation or cardioversion used? Yes   - Was external pacer placed? No  - Was patient intubated pre/post CPR? Yes   Medications Administered: Y = Yes; Blank = No Amiodarone  Y  Atropine    Calcium  Y  Epinephrine  Y  Lidocaine    Magnesium    Norepinephrine    Phenylephrine    Sodium bicarbonate  Y  Vasopressin     Post CPR evaluation:  - Final Status - Was patient successfully resuscitated ? Yes - What is current rhythm? SVT - What is current hemodynamic status? stable  Miscellaneous Information:  - Labs sent, including: CBC, BMP, Mg, Troponin, VBG  - Primary team notified?  Yes  - Family Notified? Yes  - Additional notes/ transfer status: Transferred to 2M10     Milagros Loll, MD  2015/01/05, 8:32 AM

## 2014-12-27 NOTE — Progress Notes (Signed)
Chaplain responded to multiple medical alerts for pt on 3S and 2MW.  Family in [place] conference room waiting for MD's to complete procedures.  MD updated family in consult room, pt's son and other family present.  Pt's spouse also now in consult room.  Chaplain providing emotional support for family and advocating for family to join pt at bedside as soon as team completes procedures.      04-Jan-2015 0800  Clinical Encounter Type  Visited With Patient;Family;Health care provider  Visit Type Initial;Social support;Code;Critical Care  Referral From Care management  Spiritual Encounters  Spiritual Needs Emotional  Stress Factors  Patient Stress Factors Health changes  Family Stress Factors Exhausted;Health changes   Geralyn Flash 2015-01-04 8:52 AM

## 2014-12-27 DEATH — deceased

## 2014-12-28 ENCOUNTER — Telehealth: Payer: Self-pay

## 2014-12-28 NOTE — Telephone Encounter (Signed)
Received a death certificate from [facility] on 2015-01-26. Going to take death certificate to [phone] at [facility] for signature this afternoon. I went to [facility] so that I can get Doctor Nelda Marseille to sign... When I returned from hospital I got it ready and mailed the death certificated to [facility].

## 2015-01-14 NOTE — Discharge Summary (Signed)
NAME:  [name] [name], SYME NO.:  [id number]  MEDICAL RECORD NO.:  [id number]  LOCATION:                                FACILITY:  [facility]  PHYSICIAN:  Providence Lanius, MD  DATE OF BIRTH:  April 17, 1951  DATE OF ADMISSION:  12/11/2014 DATE OF DISCHARGE:  2015/01/16                              DISCHARGE SUMMARY   PRIMARY DIAGNOSIS/CAUSE OF DEATH:  Pulseless activity cardiac arrest.  SECONDARY DIAGNOSES:  Acute respiratory failure, sepsis present on arrival, cellulitis of the leg, poor vasculature of the leg, hypoxia, acute respiratory failure, healthcare-associated pneumonia, end-stage renal disease on hemodialysis, Clostridium difficile colitis, coagulopathy, anemia of critical illness, and acute anoxic encephalopathy post cardiac arrest.  HOSPITAL COURSE:  The patient is a 64 year old male with past medical history significant for poor vascular end-stage renal disease, on hemodialysis as well as diabetes, presented to the hospital with cellulitis and necrosis of the lower extremity.  The patient admitted to the hospital, seen by Orthopedics, recommended amputation, however while in the process of waiting further amputation, the patient suffered acute pulseless activity cardiac arrest.  The ACLS protocol was followed.  The patient was intubated and the patient was brought to the Intensive Care Unit.  The patient with maximum amount of vasoactive support with failure for improvement of hemodynamics.  We had a meeting with the family.  Informed that the patient is unlikely to survive this as well as the fact that his prolonged resuscitation result in severe anoxic encephalopathy at which point, I informed the patient would not want that level of care.  We decided that discontinuation of the pressor support alone should be sufficient for the patient to pass on.  Morphine was given and when the family ready, pressor support was withdrawn for the patient to expire  comfortably with the family at bedside.     Providence Lanius, MD     WJY/MEDQ  D:  01/13/2015  T:  01/14/2015  Job:  [id number]

## 2015-02-04 ENCOUNTER — Ambulatory Visit: Payer: PRIVATE HEALTH INSURANCE | Admitting: Cardiology

## 2016-10-15 IMAGING — CR DG CHEST 2V
2 series · 2 of 2 positions shown · non-contrast
Comparison: Portable exam of 11/11/2014, chest CT 11/09/2014

CLINICAL DATA: Chest tube, essential benign hypertension, type 2
diabetes, coronary artery disease post CABG, atrial fibrillation,
end-stage renal disease

EXAM:
CHEST  2 VIEW

[chest pa]
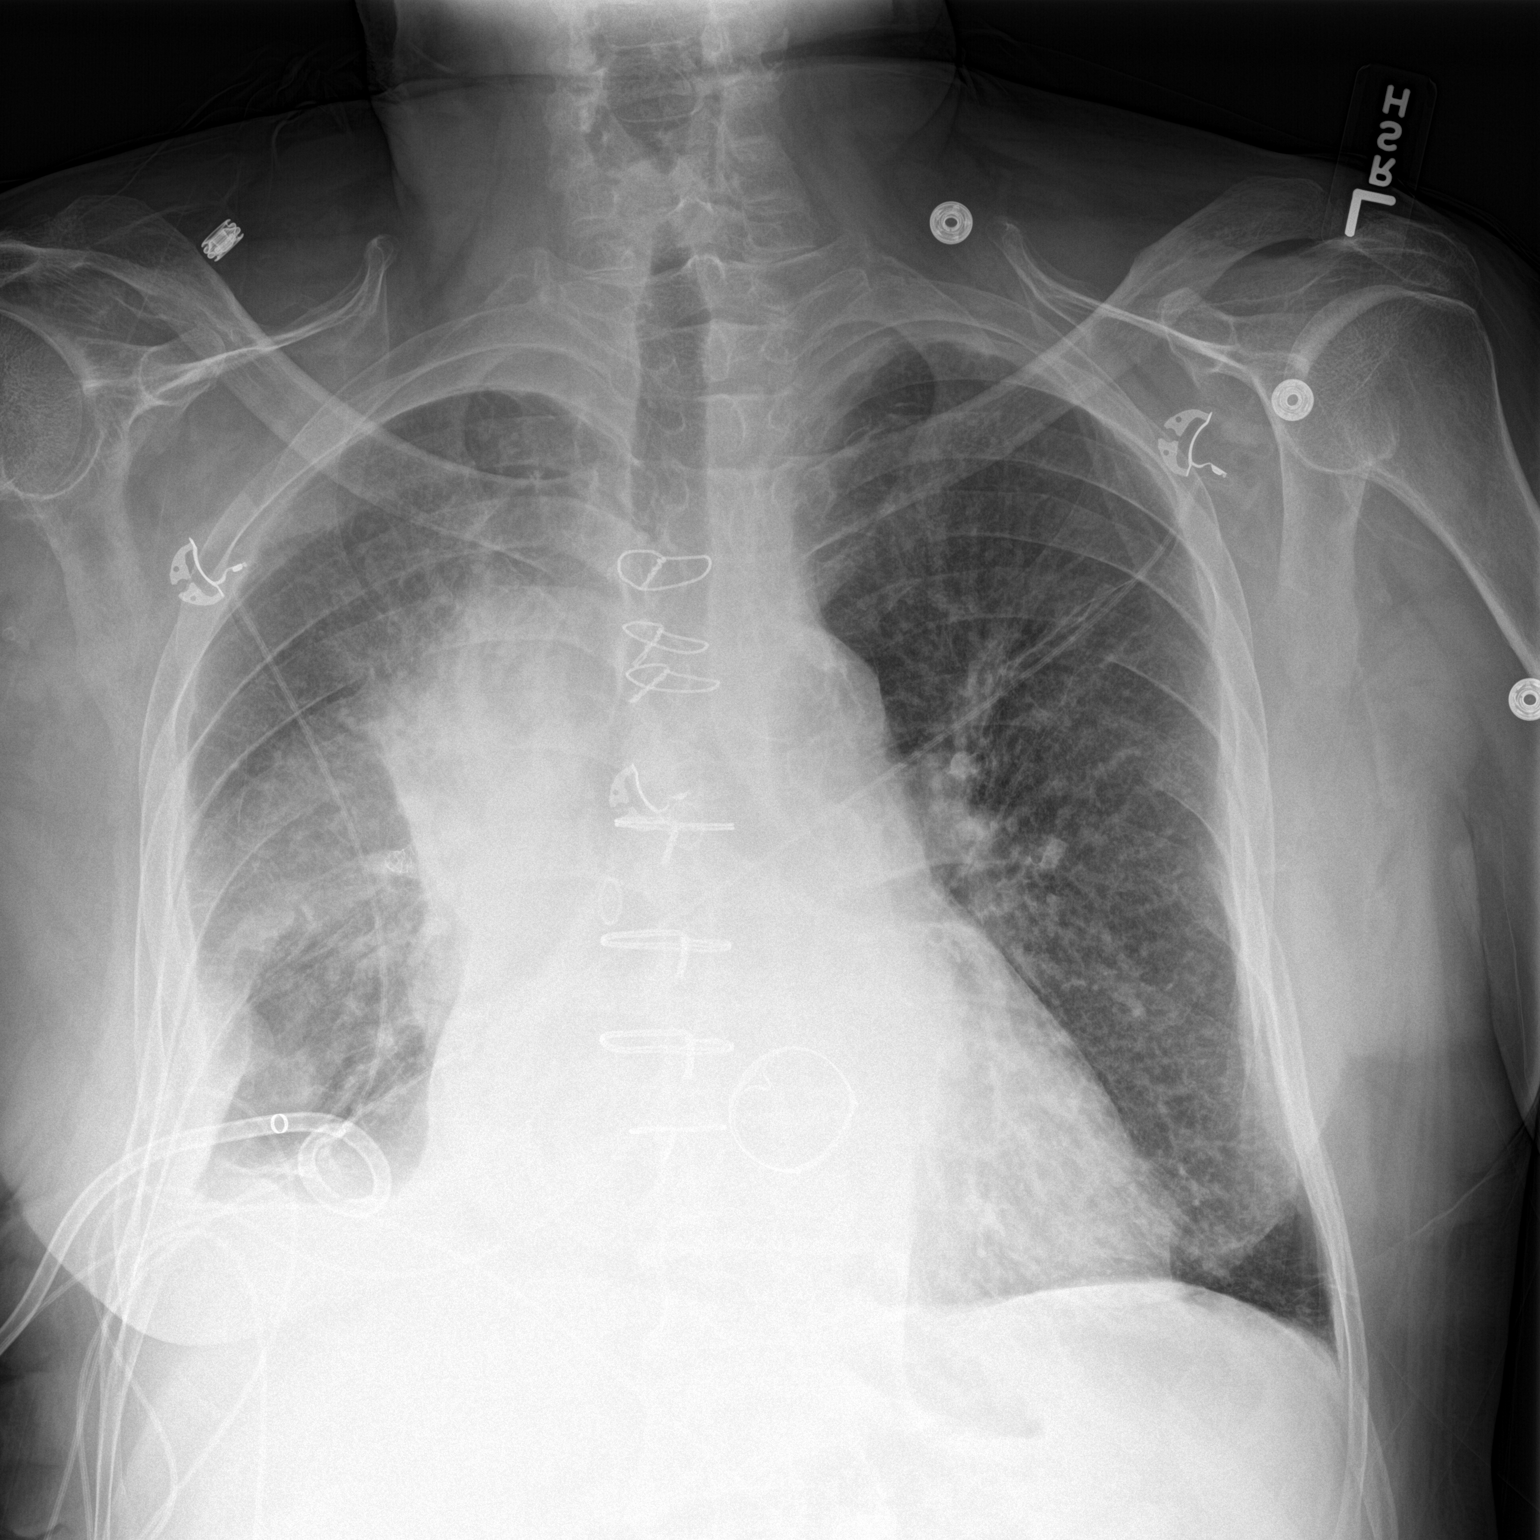

[chest lat]
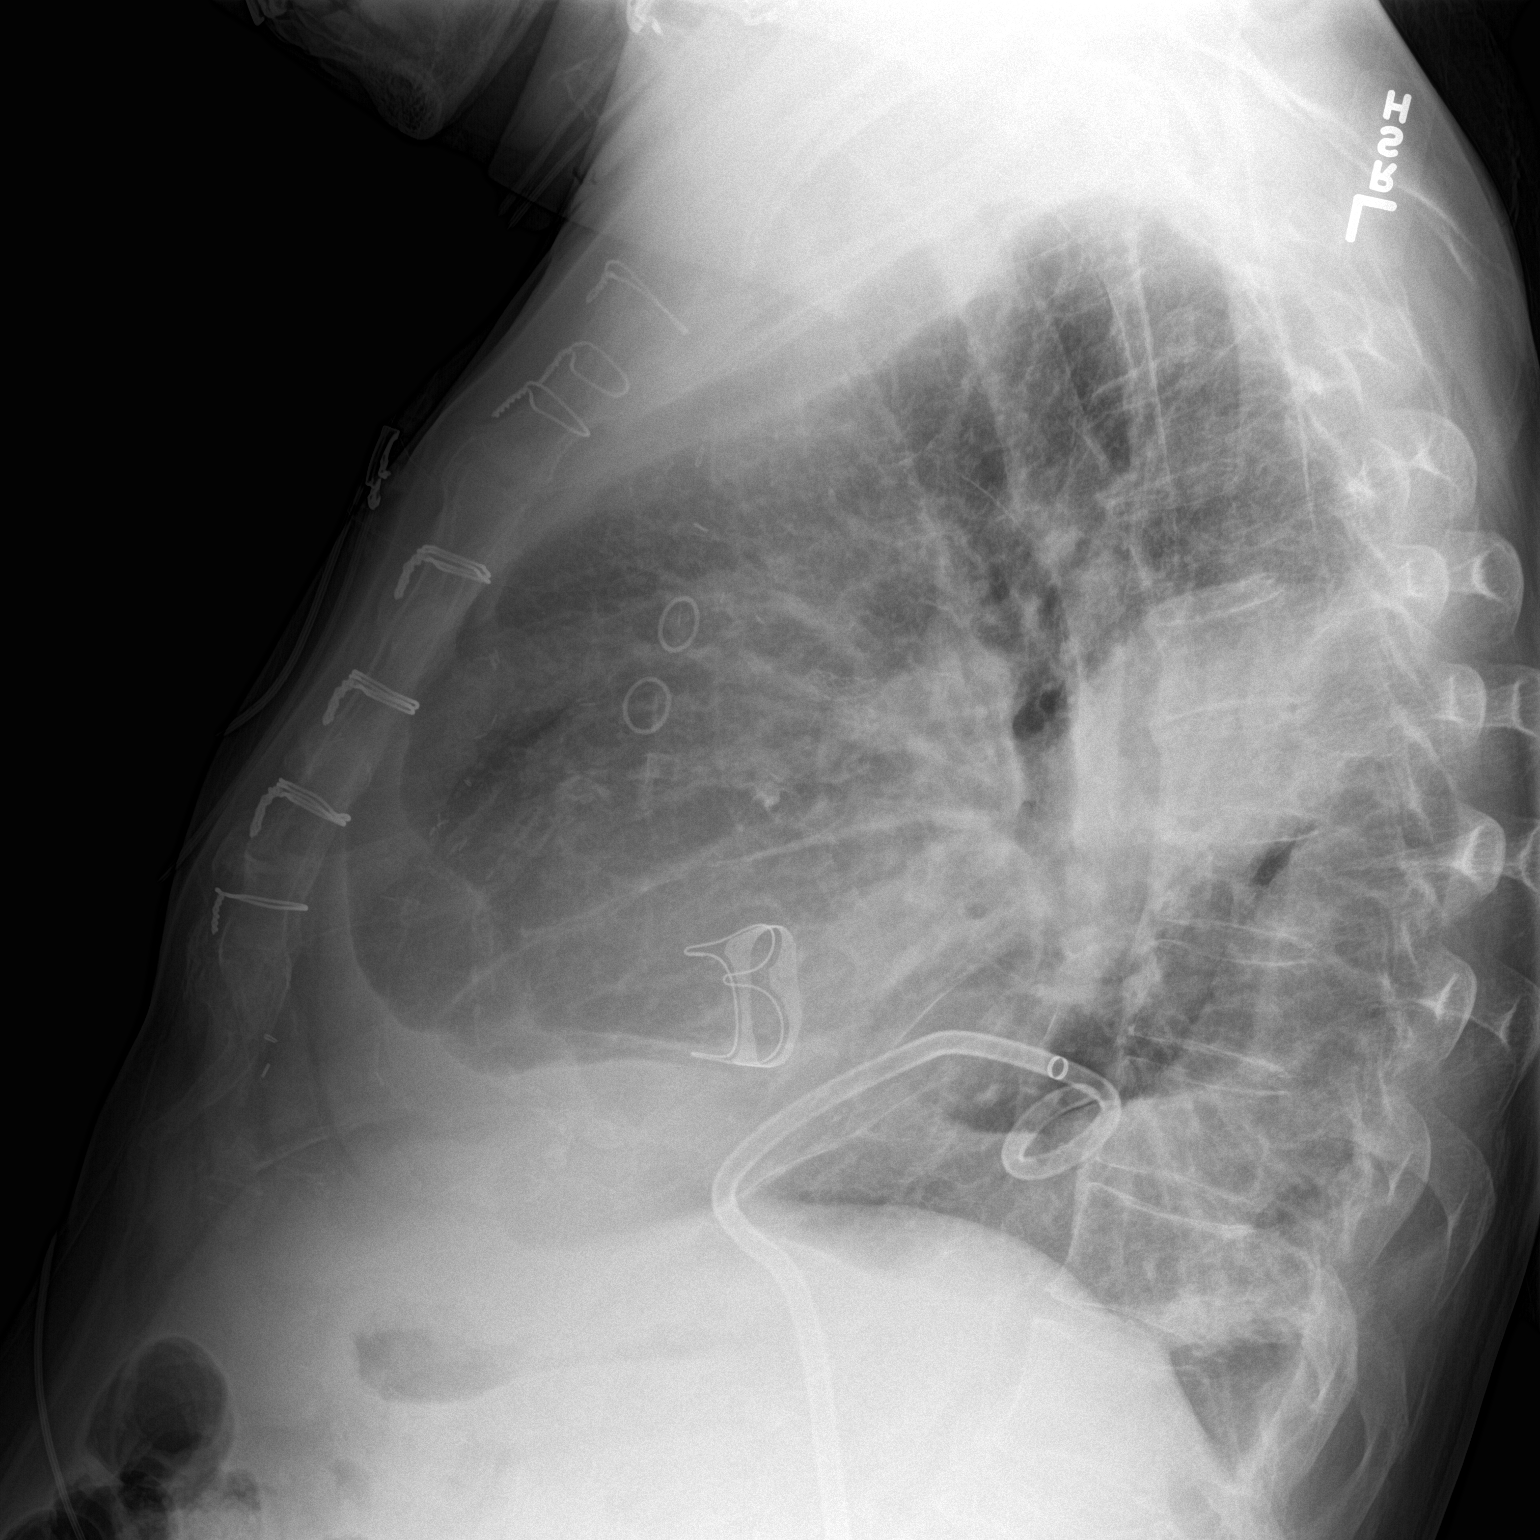

[2 of 2 positions shown; findings below may reference images not displayed]

FINDINGS: RIGHT basilar pigtail thoracostomy tube unchanged.

Enlargement of cardiac silhouette post MVR and CABG.

Pulmonary vascular congestion.

Persistent atelectasis of RIGHT upper and RIGHT lower lobes.

Persistent loculated RIGHT pleural effusion.

Probable mild pulmonary edema.

No pneumothorax.

Bones demineralized.
IMPRESSION: Enlargement of cardiac silhouette with pulmonary vascular congestion
and probable while pulmonary edema.

Persistent loculated RIGHT pleural effusion and RIGHT lung
atelectasis.

## 2016-10-19 IMAGING — CR DG CHEST 1V PORT
1 series · 1 of 1 positions shown · non-contrast
Comparison: 11/14/2014.  CT 11/09/2014.

CLINICAL DATA: Pleural effusion.  Chest tube.

EXAM:
PORTABLE CHEST - 1 VIEW

[AP]
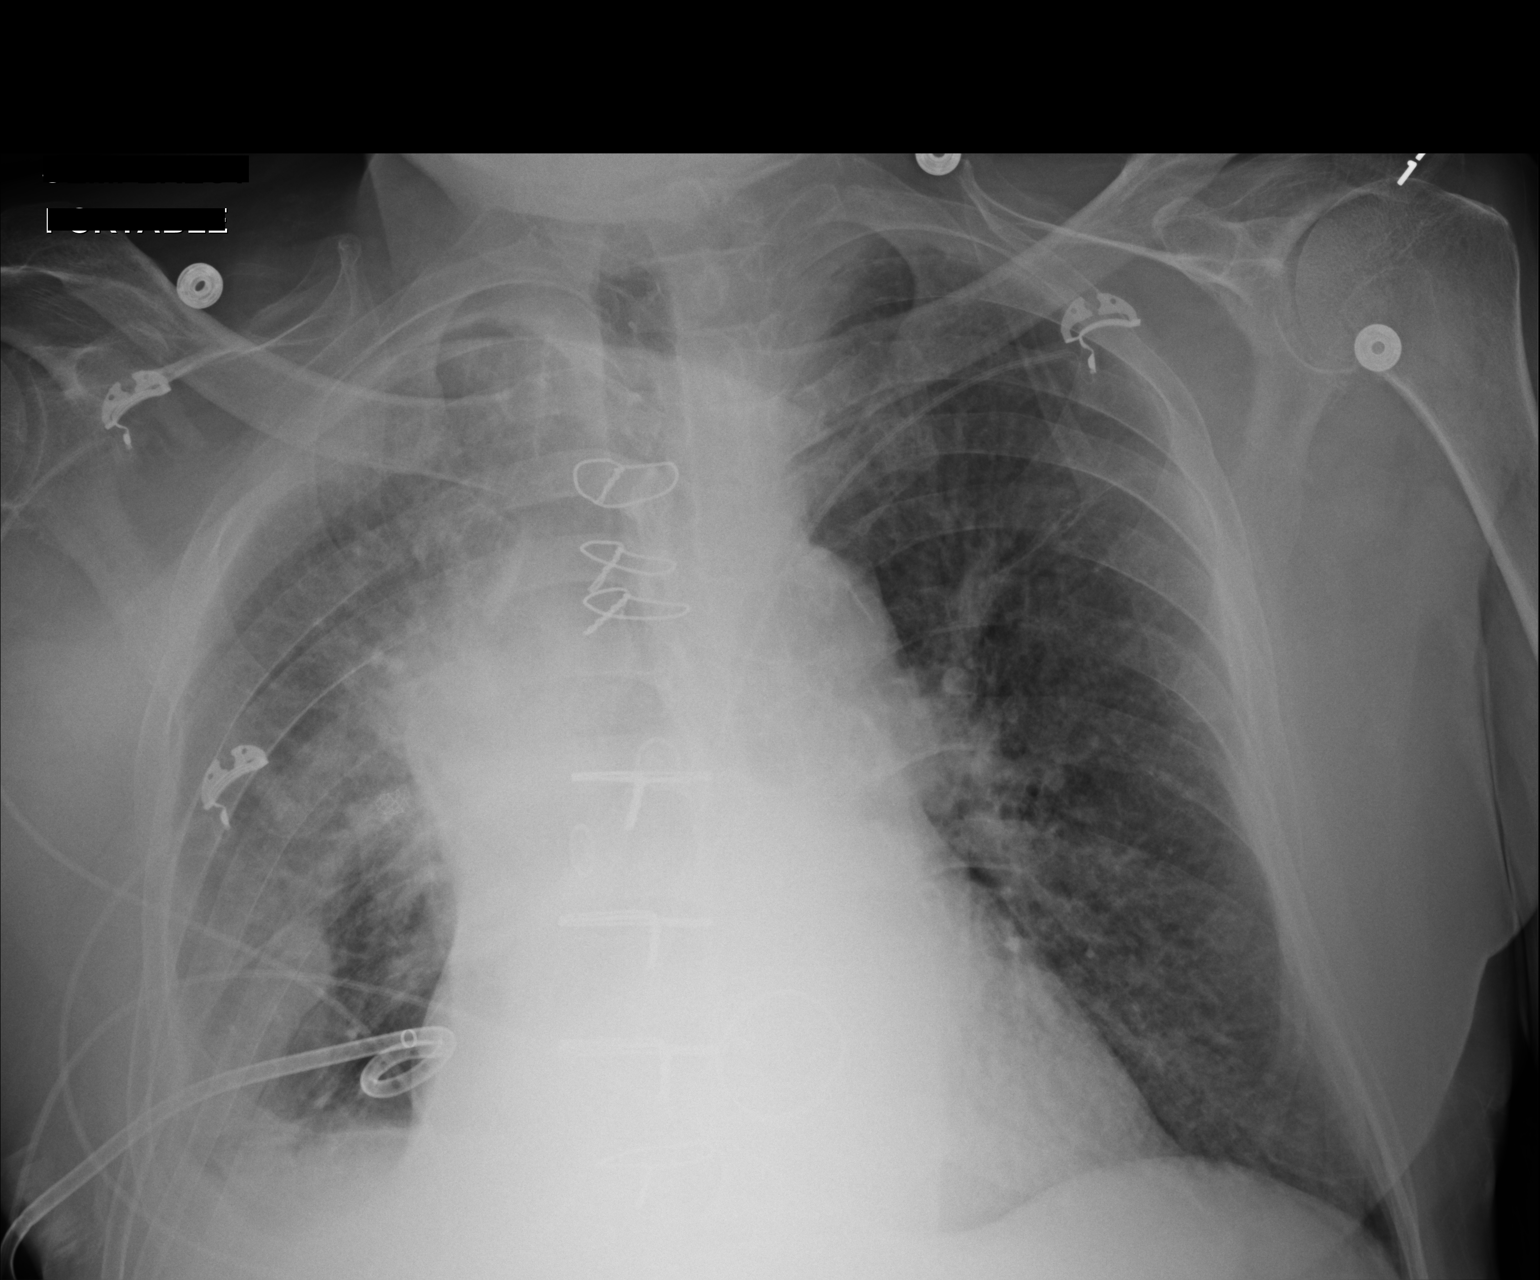

[1 of 1 positions shown; findings below may reference images not displayed]

FINDINGS: Right chest tube in stable position. Prior CABG and mitral valve
replacement. Stable cardiomegaly. No pulmonary venous congestion.
Right base and right anterior medial pleural fluid collections are
stable. Right base pleural fluid collection is improved
significantly from prior CT of 11/09/2014 . Anterior medial fluid
collection remains unchanged. No focal infiltrate or pneumothorax.
IMPRESSION: 1. Right chest tube in stable position. Right base and right
anterior medial pleural fluid collections are stable. The right base
pleural fluid collection has improved significantly from 11/09/2014.
The right anterior medial pleural fluid collection is unchanged.
2. Prior CABG and mitral valve replacement. Stable cardiomegaly. No
pulmonary venous congestion.

## 2016-10-21 IMAGING — DX DG CHEST 2V
3 series · 4 of 4 positions shown · non-contrast
Comparison: Portable chest x-ray of 11/16/2014, and CT chest of
11/08/2004

CLINICAL DATA: Chest soreness, removal of chest to

EXAM:
CHEST  2 VIEW

[chest lat (1 of 2)]
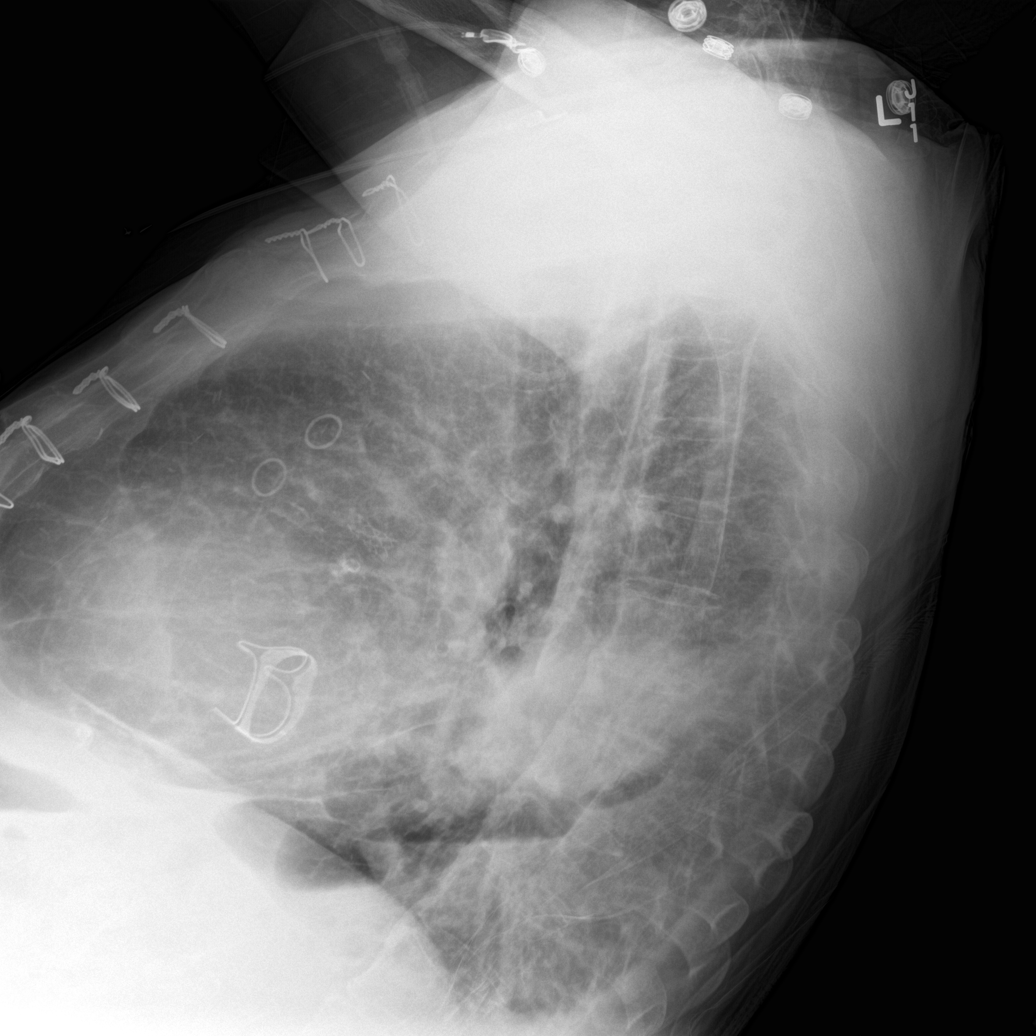

[Series 2: chest ap · 0.14mm/px · 2 of 2 slices shown]
[im 1/2]
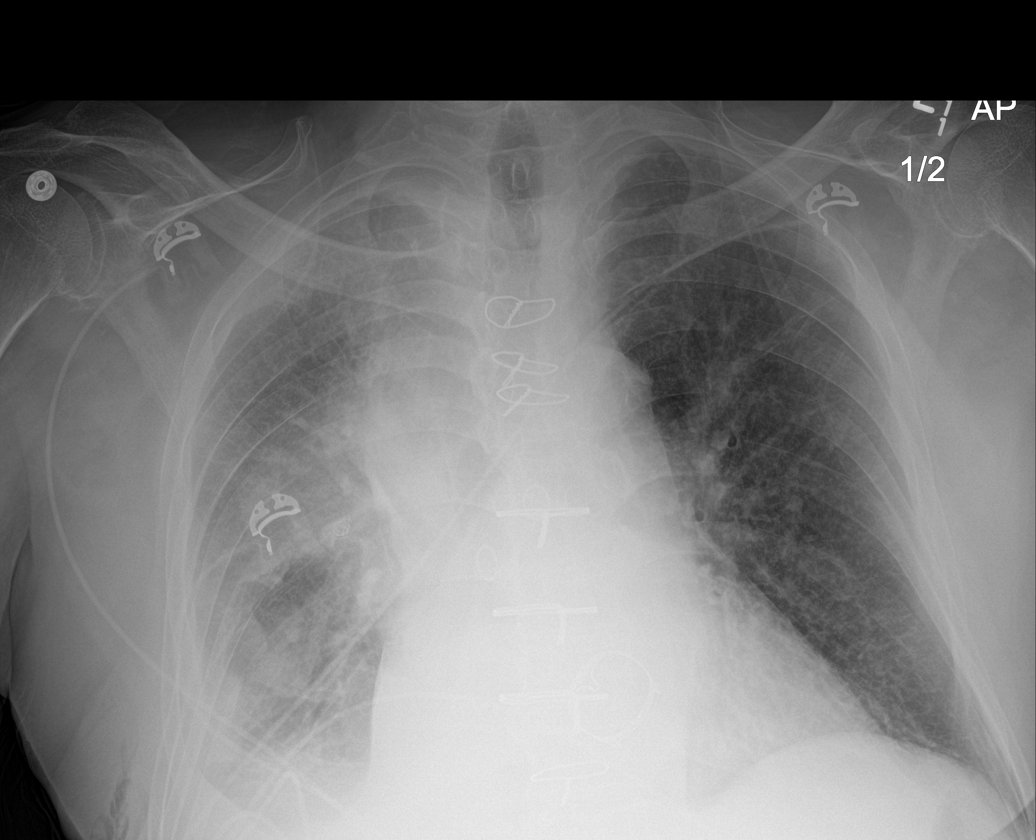
[im 2/2]
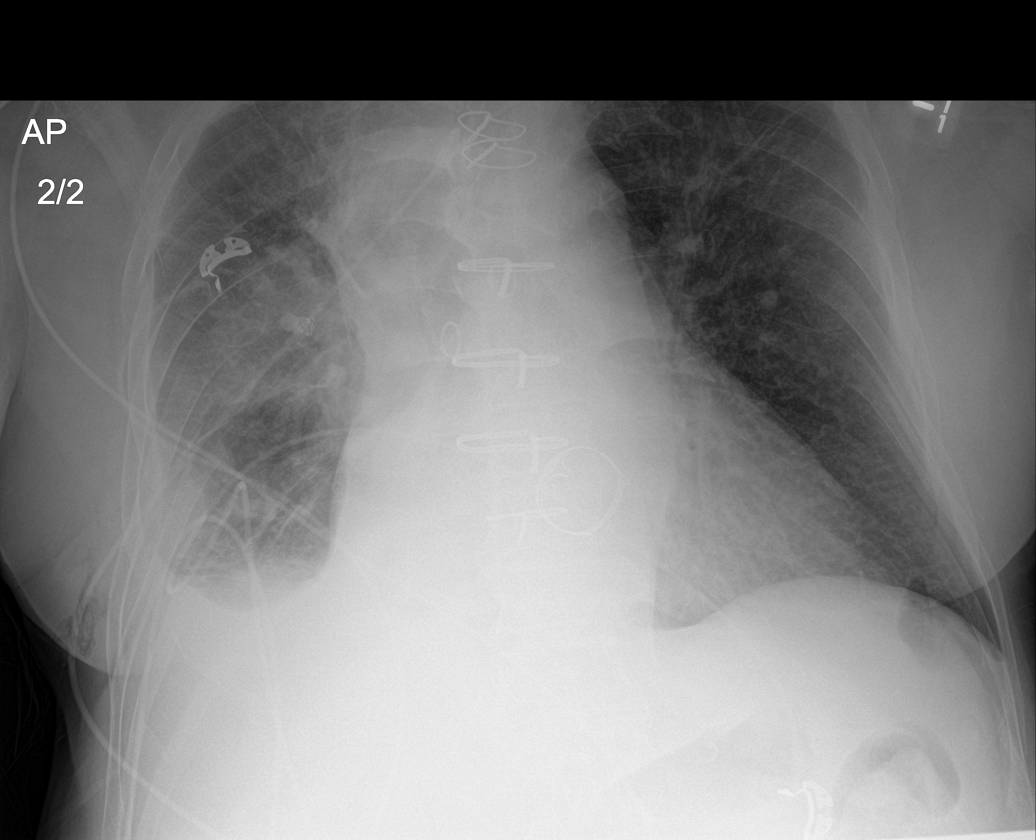

[chest lat (2 of 2)]
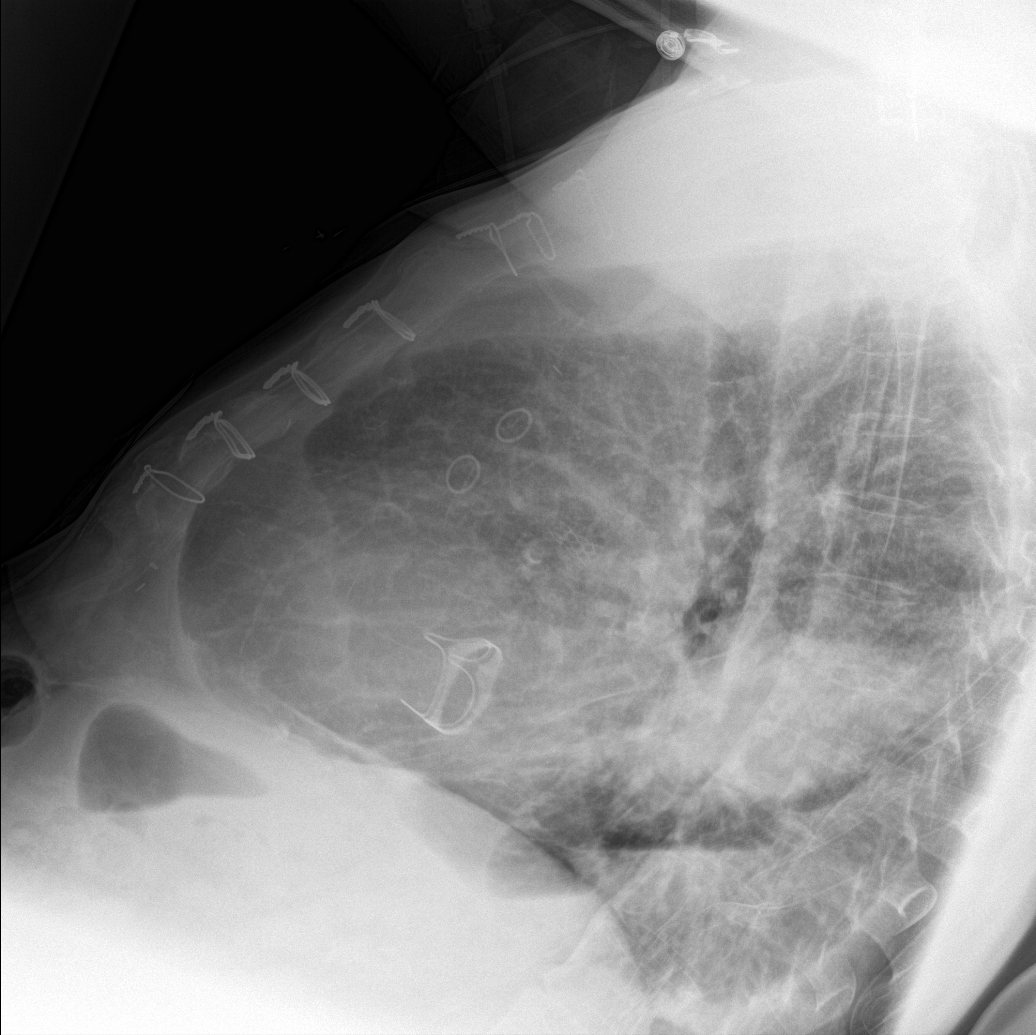

[4 of 4 positions shown; findings below may reference images not displayed]

FINDINGS: There is little change in opacity at the right lung base consistent
with right effusion. Hazy opacity in the right mid lung also
persists which may represent loculated effusion. The patient is
again somewhat rotated toward the right with somewhat prominent
right paratracheal soft tissues, possibly due to loculated effusion
as well. Cardiomegaly is stable. Minimal pulmonary vascular
congestion cannot be excluded.
IMPRESSION: 1. Little change in right pleural effusion, volume loss on the
right, and probable loculated effusion creating opacity in the right
mid lung and right paratracheal region.
2. Cardiomegaly.  Question mild pulmonary vascular congestion.
.

## 2020-11-26 DEATH — deceased
